# Patient Record
Sex: Female | Born: 1955
Health system: Southern US, Community
[De-identification: ages and names within clinical notes are randomized; demographics above are authoritative.]

## PROBLEM LIST (undated history)

## (undated) DIAGNOSIS — H269 Unspecified cataract: Secondary | ICD-10-CM

## (undated) DIAGNOSIS — Z9889 Other specified postprocedural states: Secondary | ICD-10-CM

## (undated) DIAGNOSIS — S52502A Unspecified fracture of the lower end of left radius, initial encounter for closed fracture: Secondary | ICD-10-CM

## (undated) DIAGNOSIS — Z9049 Acquired absence of other specified parts of digestive tract: Secondary | ICD-10-CM

## (undated) DIAGNOSIS — I499 Cardiac arrhythmia, unspecified: Secondary | ICD-10-CM

## (undated) DIAGNOSIS — T8859XA Other complications of anesthesia, initial encounter: Secondary | ICD-10-CM

## (undated) DIAGNOSIS — Z98891 History of uterine scar from previous surgery: Secondary | ICD-10-CM

## (undated) DIAGNOSIS — E785 Hyperlipidemia, unspecified: Secondary | ICD-10-CM

## (undated) DIAGNOSIS — K3184 Gastroparesis: Secondary | ICD-10-CM

## (undated) DIAGNOSIS — K219 Gastro-esophageal reflux disease without esophagitis: Secondary | ICD-10-CM

## (undated) DIAGNOSIS — R112 Nausea with vomiting, unspecified: Secondary | ICD-10-CM

## (undated) DIAGNOSIS — J45909 Unspecified asthma, uncomplicated: Secondary | ICD-10-CM

## (undated) DIAGNOSIS — M6740 Ganglion, unspecified site: Secondary | ICD-10-CM

## (undated) DIAGNOSIS — I10 Essential (primary) hypertension: Secondary | ICD-10-CM

## (undated) DIAGNOSIS — M1990 Unspecified osteoarthritis, unspecified site: Secondary | ICD-10-CM

## (undated) DIAGNOSIS — E1140 Type 2 diabetes mellitus with diabetic neuropathy, unspecified: Secondary | ICD-10-CM

## (undated) DIAGNOSIS — H9190 Unspecified hearing loss, unspecified ear: Secondary | ICD-10-CM

## (undated) DIAGNOSIS — D649 Anemia, unspecified: Secondary | ICD-10-CM

## (undated) DIAGNOSIS — G709 Myoneural disorder, unspecified: Secondary | ICD-10-CM

## (undated) DIAGNOSIS — I471 Supraventricular tachycardia: Secondary | ICD-10-CM

## (undated) HISTORY — DX: Gastro-esophageal reflux disease without esophagitis: K21.9

## (undated) HISTORY — PX: TUBAL LIGATION: SHX77

## (undated) HISTORY — DX: Ganglion, unspecified site: M67.40

## (undated) HISTORY — DX: Hyperlipidemia, unspecified: E78.5

## (undated) HISTORY — DX: Essential (primary) hypertension: I10

## (undated) HISTORY — DX: Type 2 diabetes mellitus with diabetic neuropathy, unspecified: E11.40

## (undated) HISTORY — DX: Gastroparesis: K31.84

## (undated) HISTORY — PX: WRIST SURGERY: SHX841

## (undated) HISTORY — DX: Supraventricular tachycardia: I47.1

## (undated) HISTORY — DX: Unspecified hearing loss, unspecified ear: H91.90

## (undated) HISTORY — PX: APPENDECTOMY: SHX54

---
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
## 1983-07-04 HISTORY — PX: WRIST SURGERY: SHX841

## 1997-11-24 ENCOUNTER — Encounter: Admission: RE | Admit: 1997-11-24 | Discharge: 1997-11-24 | Payer: Self-pay | Admitting: Family Medicine

## 1997-12-22 ENCOUNTER — Encounter: Admission: RE | Admit: 1997-12-22 | Discharge: 1997-12-22 | Payer: Self-pay | Admitting: Family Medicine

## 1998-02-05 ENCOUNTER — Encounter: Admission: RE | Admit: 1998-02-05 | Discharge: 1998-02-05 | Payer: Self-pay | Admitting: Family Medicine

## 1998-02-16 ENCOUNTER — Encounter: Admission: RE | Admit: 1998-02-16 | Discharge: 1998-02-16 | Payer: Self-pay | Admitting: Sports Medicine

## 1998-03-02 ENCOUNTER — Encounter: Admission: RE | Admit: 1998-03-02 | Discharge: 1998-03-02 | Payer: Self-pay | Admitting: Sports Medicine

## 1998-03-04 ENCOUNTER — Encounter: Admission: RE | Admit: 1998-03-04 | Discharge: 1998-03-04 | Payer: Self-pay | Admitting: Family Medicine

## 1998-03-10 ENCOUNTER — Encounter: Admission: RE | Admit: 1998-03-10 | Discharge: 1998-03-10 | Payer: Self-pay | Admitting: Family Medicine

## 1998-10-25 ENCOUNTER — Encounter: Admission: RE | Admit: 1998-10-25 | Discharge: 1998-10-25 | Payer: Self-pay | Admitting: Family Medicine

## 1998-11-21 ENCOUNTER — Emergency Department (HOSPITAL_COMMUNITY): Admission: EM | Admit: 1998-11-21 | Discharge: 1998-11-21 | Payer: Self-pay | Admitting: Emergency Medicine

## 1998-11-21 ENCOUNTER — Encounter: Payer: Self-pay | Admitting: Emergency Medicine

## 1999-01-06 ENCOUNTER — Encounter: Admission: RE | Admit: 1999-01-06 | Discharge: 1999-01-06 | Payer: Self-pay | Admitting: Family Medicine

## 1999-05-03 ENCOUNTER — Encounter: Admission: RE | Admit: 1999-05-03 | Discharge: 1999-05-03 | Payer: Self-pay | Admitting: Family Medicine

## 1999-05-18 ENCOUNTER — Encounter: Admission: RE | Admit: 1999-05-18 | Discharge: 1999-05-18 | Payer: Self-pay | Admitting: *Deleted

## 1999-11-22 ENCOUNTER — Encounter: Admission: RE | Admit: 1999-11-22 | Discharge: 1999-11-22 | Payer: Self-pay | Admitting: Family Medicine

## 1999-11-29 ENCOUNTER — Encounter: Admission: RE | Admit: 1999-11-29 | Discharge: 1999-11-29 | Payer: Self-pay | Admitting: Sports Medicine

## 1999-12-26 ENCOUNTER — Encounter: Admission: RE | Admit: 1999-12-26 | Discharge: 1999-12-26 | Payer: Self-pay | Admitting: Family Medicine

## 2000-01-17 ENCOUNTER — Encounter: Admission: RE | Admit: 2000-01-17 | Discharge: 2000-01-17 | Payer: Self-pay | Admitting: Sports Medicine

## 2000-01-26 ENCOUNTER — Encounter: Payer: Self-pay | Admitting: Family Medicine

## 2000-01-26 ENCOUNTER — Encounter: Admission: RE | Admit: 2000-01-26 | Discharge: 2000-01-26 | Payer: Self-pay | Admitting: Family Medicine

## 2000-01-26 ENCOUNTER — Encounter: Admission: RE | Admit: 2000-01-26 | Discharge: 2000-01-26 | Payer: Self-pay | Admitting: *Deleted

## 2000-01-30 ENCOUNTER — Encounter: Admission: RE | Admit: 2000-01-30 | Discharge: 2000-01-30 | Payer: Self-pay | Admitting: Family Medicine

## 2000-05-15 ENCOUNTER — Encounter: Admission: RE | Admit: 2000-05-15 | Discharge: 2000-05-15 | Payer: Self-pay | Admitting: Sports Medicine

## 2000-05-18 ENCOUNTER — Encounter: Admission: RE | Admit: 2000-05-18 | Discharge: 2000-05-18 | Payer: Self-pay | Admitting: Family Medicine

## 2000-06-12 ENCOUNTER — Encounter: Admission: RE | Admit: 2000-06-12 | Discharge: 2000-06-12 | Payer: Self-pay | Admitting: Family Medicine

## 2000-07-13 ENCOUNTER — Encounter: Admission: RE | Admit: 2000-07-13 | Discharge: 2000-07-13 | Payer: Self-pay | Admitting: Family Medicine

## 2000-07-17 ENCOUNTER — Encounter: Admission: RE | Admit: 2000-07-17 | Discharge: 2000-07-17 | Payer: Self-pay | Admitting: Sports Medicine

## 2000-08-07 ENCOUNTER — Encounter: Admission: RE | Admit: 2000-08-07 | Discharge: 2000-08-07 | Payer: Self-pay | Admitting: Sports Medicine

## 2000-09-05 ENCOUNTER — Encounter: Payer: Self-pay | Admitting: Family Medicine

## 2000-09-05 ENCOUNTER — Encounter: Admission: RE | Admit: 2000-09-05 | Discharge: 2000-09-05 | Payer: Self-pay | Admitting: Family Medicine

## 2000-09-19 ENCOUNTER — Encounter: Payer: Self-pay | Admitting: Sports Medicine

## 2000-09-19 ENCOUNTER — Encounter: Admission: RE | Admit: 2000-09-19 | Discharge: 2000-09-19 | Payer: Self-pay | Admitting: Family Medicine

## 2000-09-19 ENCOUNTER — Encounter: Admission: RE | Admit: 2000-09-19 | Discharge: 2000-09-19 | Payer: Self-pay | Admitting: Sports Medicine

## 2000-09-26 ENCOUNTER — Encounter: Admission: RE | Admit: 2000-09-26 | Discharge: 2000-09-26 | Payer: Self-pay | Admitting: Family Medicine

## 2000-10-10 ENCOUNTER — Encounter: Admission: RE | Admit: 2000-10-10 | Discharge: 2000-10-10 | Payer: Self-pay | Admitting: Family Medicine

## 2000-11-21 ENCOUNTER — Encounter: Admission: RE | Admit: 2000-11-21 | Discharge: 2000-11-21 | Payer: Self-pay | Admitting: Family Medicine

## 2000-11-29 ENCOUNTER — Encounter: Admission: RE | Admit: 2000-11-29 | Discharge: 2000-11-29 | Payer: Self-pay | Admitting: Family Medicine

## 2000-11-29 ENCOUNTER — Encounter: Payer: Self-pay | Admitting: Family Medicine

## 2000-12-20 ENCOUNTER — Encounter: Admission: RE | Admit: 2000-12-20 | Discharge: 2000-12-20 | Payer: Self-pay | Admitting: Family Medicine

## 2000-12-27 ENCOUNTER — Encounter: Admission: RE | Admit: 2000-12-27 | Discharge: 2000-12-27 | Payer: Self-pay | Admitting: Family Medicine

## 2001-01-08 ENCOUNTER — Emergency Department (HOSPITAL_COMMUNITY): Admission: EM | Admit: 2001-01-08 | Discharge: 2001-01-08 | Payer: Self-pay

## 2001-01-24 ENCOUNTER — Encounter: Admission: RE | Admit: 2001-01-24 | Discharge: 2001-01-24 | Payer: Self-pay | Admitting: Family Medicine

## 2001-03-11 ENCOUNTER — Encounter: Admission: RE | Admit: 2001-03-11 | Discharge: 2001-03-11 | Payer: Self-pay | Admitting: Family Medicine

## 2001-05-28 ENCOUNTER — Encounter: Admission: RE | Admit: 2001-05-28 | Discharge: 2001-05-28 | Payer: Self-pay | Admitting: Family Medicine

## 2001-05-28 ENCOUNTER — Encounter: Admission: RE | Admit: 2001-05-28 | Discharge: 2001-05-28 | Payer: Self-pay | Admitting: *Deleted

## 2001-06-24 ENCOUNTER — Encounter: Admission: RE | Admit: 2001-06-24 | Discharge: 2001-06-24 | Payer: Self-pay | Admitting: Family Medicine

## 2001-08-13 ENCOUNTER — Encounter: Admission: RE | Admit: 2001-08-13 | Discharge: 2001-08-13 | Payer: Self-pay | Admitting: Family Medicine

## 2001-08-28 ENCOUNTER — Encounter: Admission: RE | Admit: 2001-08-28 | Discharge: 2001-08-28 | Payer: Self-pay | Admitting: Family Medicine

## 2001-09-11 ENCOUNTER — Encounter: Payer: Self-pay | Admitting: Family Medicine

## 2001-09-11 ENCOUNTER — Encounter: Admission: RE | Admit: 2001-09-11 | Discharge: 2001-09-11 | Payer: Self-pay | Admitting: Family Medicine

## 2001-09-20 ENCOUNTER — Encounter: Admission: RE | Admit: 2001-09-20 | Discharge: 2001-09-20 | Payer: Self-pay | Admitting: Family Medicine

## 2001-09-20 ENCOUNTER — Encounter: Payer: Self-pay | Admitting: Family Medicine

## 2001-09-26 ENCOUNTER — Encounter: Admission: RE | Admit: 2001-09-26 | Discharge: 2001-09-26 | Payer: Self-pay | Admitting: Family Medicine

## 2001-10-11 ENCOUNTER — Encounter: Admission: RE | Admit: 2001-10-11 | Discharge: 2001-10-11 | Payer: Self-pay | Admitting: Family Medicine

## 2001-10-30 ENCOUNTER — Encounter: Admission: RE | Admit: 2001-10-30 | Discharge: 2001-10-30 | Payer: Self-pay | Admitting: Family Medicine

## 2001-11-08 ENCOUNTER — Encounter: Admission: RE | Admit: 2001-11-08 | Discharge: 2001-11-08 | Payer: Self-pay | Admitting: Family Medicine

## 2001-11-14 ENCOUNTER — Encounter: Admission: RE | Admit: 2001-11-14 | Discharge: 2001-12-09 | Payer: Self-pay | Admitting: Sports Medicine

## 2001-12-19 ENCOUNTER — Encounter: Admission: RE | Admit: 2001-12-19 | Discharge: 2001-12-19 | Payer: Self-pay | Admitting: Family Medicine

## 2001-12-25 ENCOUNTER — Encounter: Admission: RE | Admit: 2001-12-25 | Discharge: 2001-12-25 | Payer: Self-pay | Admitting: Family Medicine

## 2002-01-27 ENCOUNTER — Encounter: Admission: RE | Admit: 2002-01-27 | Discharge: 2002-04-15 | Payer: Self-pay | Admitting: Family Medicine

## 2002-01-29 ENCOUNTER — Encounter: Admission: RE | Admit: 2002-01-29 | Discharge: 2002-01-29 | Payer: Self-pay | Admitting: Family Medicine

## 2002-02-04 ENCOUNTER — Encounter: Admission: RE | Admit: 2002-02-04 | Discharge: 2002-02-04 | Payer: Self-pay | Admitting: Family Medicine

## 2002-02-19 ENCOUNTER — Encounter: Admission: RE | Admit: 2002-02-19 | Discharge: 2002-02-19 | Payer: Self-pay | Admitting: Family Medicine

## 2002-03-27 ENCOUNTER — Encounter: Admission: RE | Admit: 2002-03-27 | Discharge: 2002-03-27 | Payer: Self-pay | Admitting: Family Medicine

## 2002-06-20 ENCOUNTER — Encounter: Admission: RE | Admit: 2002-06-20 | Discharge: 2002-06-20 | Payer: Self-pay | Admitting: Family Medicine

## 2002-07-14 ENCOUNTER — Encounter: Admission: RE | Admit: 2002-07-14 | Discharge: 2002-07-14 | Payer: Self-pay | Admitting: Family Medicine

## 2002-08-28 ENCOUNTER — Encounter: Admission: RE | Admit: 2002-08-28 | Discharge: 2002-08-28 | Payer: Self-pay | Admitting: Family Medicine

## 2002-09-01 ENCOUNTER — Encounter: Payer: Self-pay | Admitting: Sports Medicine

## 2002-09-01 ENCOUNTER — Encounter: Admission: RE | Admit: 2002-09-01 | Discharge: 2002-09-01 | Payer: Self-pay | Admitting: Sports Medicine

## 2002-09-04 ENCOUNTER — Encounter: Admission: RE | Admit: 2002-09-04 | Discharge: 2002-09-04 | Payer: Self-pay | Admitting: Family Medicine

## 2002-09-15 ENCOUNTER — Ambulatory Visit (HOSPITAL_COMMUNITY): Admission: RE | Admit: 2002-09-15 | Discharge: 2002-09-15 | Payer: Self-pay | Admitting: *Deleted

## 2002-09-26 ENCOUNTER — Encounter: Admission: RE | Admit: 2002-09-26 | Discharge: 2002-09-26 | Payer: Self-pay | Admitting: Family Medicine

## 2002-10-14 ENCOUNTER — Ambulatory Visit (HOSPITAL_COMMUNITY): Admission: RE | Admit: 2002-10-14 | Discharge: 2002-10-14 | Payer: Self-pay | Admitting: *Deleted

## 2002-10-14 ENCOUNTER — Encounter (INDEPENDENT_AMBULATORY_CARE_PROVIDER_SITE_OTHER): Payer: Self-pay | Admitting: Cardiology

## 2002-11-20 ENCOUNTER — Encounter: Admission: RE | Admit: 2002-11-20 | Discharge: 2002-11-20 | Payer: Self-pay | Admitting: Sports Medicine

## 2002-12-09 ENCOUNTER — Encounter: Admission: RE | Admit: 2002-12-09 | Discharge: 2002-12-09 | Payer: Self-pay | Admitting: Family Medicine

## 2002-12-25 ENCOUNTER — Encounter: Admission: RE | Admit: 2002-12-25 | Discharge: 2002-12-25 | Payer: Self-pay | Admitting: Family Medicine

## 2003-02-09 ENCOUNTER — Encounter: Admission: RE | Admit: 2003-02-09 | Discharge: 2003-02-09 | Payer: Self-pay | Admitting: Family Medicine

## 2003-02-16 ENCOUNTER — Encounter: Admission: RE | Admit: 2003-02-16 | Discharge: 2003-02-16 | Payer: Self-pay | Admitting: Sports Medicine

## 2003-04-29 ENCOUNTER — Encounter: Admission: RE | Admit: 2003-04-29 | Discharge: 2003-04-29 | Payer: Self-pay | Admitting: Family Medicine

## 2003-08-18 ENCOUNTER — Encounter: Admission: RE | Admit: 2003-08-18 | Discharge: 2003-08-18 | Payer: Self-pay | Admitting: Sports Medicine

## 2003-09-01 ENCOUNTER — Encounter: Admission: RE | Admit: 2003-09-01 | Discharge: 2003-09-01 | Payer: Self-pay | Admitting: Family Medicine

## 2003-09-03 ENCOUNTER — Encounter: Admission: RE | Admit: 2003-09-03 | Discharge: 2003-09-03 | Payer: Self-pay | Admitting: Family Medicine

## 2003-09-21 ENCOUNTER — Ambulatory Visit (HOSPITAL_COMMUNITY): Admission: RE | Admit: 2003-09-21 | Discharge: 2003-09-21 | Payer: Self-pay | Admitting: Family Medicine

## 2003-10-16 ENCOUNTER — Encounter: Admission: RE | Admit: 2003-10-16 | Discharge: 2003-10-16 | Payer: Self-pay | Admitting: Sports Medicine

## 2003-12-14 ENCOUNTER — Encounter: Admission: RE | Admit: 2003-12-14 | Discharge: 2003-12-14 | Payer: Self-pay | Admitting: Family Medicine

## 2003-12-14 ENCOUNTER — Ambulatory Visit (HOSPITAL_COMMUNITY): Admission: RE | Admit: 2003-12-14 | Discharge: 2003-12-14 | Payer: Self-pay | Admitting: Family Medicine

## 2003-12-28 ENCOUNTER — Encounter: Admission: RE | Admit: 2003-12-28 | Discharge: 2003-12-28 | Payer: Self-pay | Admitting: Family Medicine

## 2004-02-17 ENCOUNTER — Encounter: Admission: RE | Admit: 2004-02-17 | Discharge: 2004-02-17 | Payer: Self-pay | Admitting: Family Medicine

## 2004-04-11 ENCOUNTER — Ambulatory Visit: Payer: Self-pay | Admitting: Sports Medicine

## 2004-06-30 ENCOUNTER — Ambulatory Visit: Payer: Self-pay | Admitting: Family Medicine

## 2004-07-03 HISTORY — PX: OTHER SURGICAL HISTORY: SHX169

## 2004-09-09 ENCOUNTER — Ambulatory Visit: Payer: Self-pay | Admitting: Family Medicine

## 2004-09-13 ENCOUNTER — Ambulatory Visit: Payer: Self-pay | Admitting: Family Medicine

## 2004-09-21 ENCOUNTER — Ambulatory Visit (HOSPITAL_COMMUNITY): Admission: RE | Admit: 2004-09-21 | Discharge: 2004-09-21 | Payer: Self-pay | Admitting: Family Medicine

## 2004-10-11 ENCOUNTER — Ambulatory Visit: Payer: Self-pay | Admitting: Family Medicine

## 2004-11-01 ENCOUNTER — Ambulatory Visit (HOSPITAL_COMMUNITY): Admission: RE | Admit: 2004-11-01 | Discharge: 2004-11-01 | Payer: Self-pay | Admitting: Family Medicine

## 2004-12-22 ENCOUNTER — Ambulatory Visit: Payer: Self-pay | Admitting: Family Medicine

## 2004-12-30 ENCOUNTER — Ambulatory Visit: Payer: Self-pay | Admitting: Sports Medicine

## 2005-01-13 ENCOUNTER — Ambulatory Visit (HOSPITAL_COMMUNITY): Admission: RE | Admit: 2005-01-13 | Discharge: 2005-01-13 | Payer: Self-pay | Admitting: Family Medicine

## 2005-01-13 ENCOUNTER — Ambulatory Visit: Payer: Self-pay | Admitting: Family Medicine

## 2005-01-18 ENCOUNTER — Ambulatory Visit: Payer: Self-pay | Admitting: Family Medicine

## 2005-01-27 ENCOUNTER — Ambulatory Visit: Payer: Self-pay | Admitting: Family Medicine

## 2005-02-27 ENCOUNTER — Ambulatory Visit (HOSPITAL_COMMUNITY): Admission: RE | Admit: 2005-02-27 | Discharge: 2005-02-27 | Payer: Self-pay | Admitting: Family Medicine

## 2005-02-27 ENCOUNTER — Ambulatory Visit: Payer: Self-pay | Admitting: Family Medicine

## 2005-03-15 ENCOUNTER — Ambulatory Visit: Payer: Self-pay | Admitting: Family Medicine

## 2005-03-28 ENCOUNTER — Encounter: Admission: RE | Admit: 2005-03-28 | Discharge: 2005-06-26 | Payer: Self-pay | Admitting: Internal Medicine

## 2005-04-11 ENCOUNTER — Ambulatory Visit: Payer: Self-pay | Admitting: Sports Medicine

## 2005-06-01 ENCOUNTER — Encounter: Admission: RE | Admit: 2005-06-01 | Discharge: 2005-06-01 | Payer: Self-pay | Admitting: Obstetrics and Gynecology

## 2005-06-04 ENCOUNTER — Encounter: Admission: RE | Admit: 2005-06-04 | Discharge: 2005-06-04 | Payer: Self-pay | Admitting: Interventional Radiology

## 2005-06-30 ENCOUNTER — Ambulatory Visit (HOSPITAL_COMMUNITY): Admission: EM | Admit: 2005-06-30 | Discharge: 2005-07-01 | Payer: Self-pay | Admitting: Interventional Radiology

## 2005-07-03 HISTORY — PX: UTERINE FIBROID EMBOLIZATION: SHX825

## 2005-07-14 ENCOUNTER — Ambulatory Visit: Payer: Self-pay | Admitting: Sports Medicine

## 2005-07-31 ENCOUNTER — Ambulatory Visit: Payer: Self-pay | Admitting: Sports Medicine

## 2005-08-29 ENCOUNTER — Ambulatory Visit: Payer: Self-pay | Admitting: Family Medicine

## 2005-09-22 ENCOUNTER — Ambulatory Visit (HOSPITAL_COMMUNITY): Admission: RE | Admit: 2005-09-22 | Discharge: 2005-09-22 | Payer: Self-pay | Admitting: Family Medicine

## 2005-10-01 ENCOUNTER — Encounter (INDEPENDENT_AMBULATORY_CARE_PROVIDER_SITE_OTHER): Payer: Self-pay | Admitting: *Deleted

## 2005-10-01 LAB — CONVERTED CEMR LAB

## 2005-10-12 ENCOUNTER — Encounter: Admission: RE | Admit: 2005-10-12 | Discharge: 2005-10-12 | Payer: Self-pay | Admitting: Interventional Radiology

## 2005-10-23 ENCOUNTER — Ambulatory Visit: Payer: Self-pay | Admitting: Family Medicine

## 2005-11-06 ENCOUNTER — Ambulatory Visit: Payer: Self-pay | Admitting: Family Medicine

## 2006-03-02 ENCOUNTER — Ambulatory Visit: Payer: Self-pay | Admitting: Family Medicine

## 2006-03-22 ENCOUNTER — Encounter: Admission: RE | Admit: 2006-03-22 | Discharge: 2006-03-22 | Payer: Self-pay | Admitting: Interventional Radiology

## 2006-03-24 ENCOUNTER — Emergency Department (HOSPITAL_COMMUNITY): Admission: EM | Admit: 2006-03-24 | Discharge: 2006-03-24 | Payer: Self-pay | Admitting: Emergency Medicine

## 2006-04-02 ENCOUNTER — Ambulatory Visit: Payer: Self-pay | Admitting: Family Medicine

## 2006-05-30 ENCOUNTER — Ambulatory Visit: Payer: Self-pay | Admitting: Family Medicine

## 2006-06-18 ENCOUNTER — Ambulatory Visit: Payer: Self-pay | Admitting: Family Medicine

## 2006-07-13 ENCOUNTER — Ambulatory Visit: Payer: Self-pay | Admitting: Family Medicine

## 2006-08-30 DIAGNOSIS — E1139 Type 2 diabetes mellitus with other diabetic ophthalmic complication: Secondary | ICD-10-CM | POA: Insufficient documentation

## 2006-08-30 DIAGNOSIS — E1142 Type 2 diabetes mellitus with diabetic polyneuropathy: Secondary | ICD-10-CM | POA: Insufficient documentation

## 2006-08-30 DIAGNOSIS — H9190 Unspecified hearing loss, unspecified ear: Secondary | ICD-10-CM | POA: Insufficient documentation

## 2006-08-30 DIAGNOSIS — E7800 Pure hypercholesterolemia, unspecified: Secondary | ICD-10-CM | POA: Insufficient documentation

## 2006-08-30 DIAGNOSIS — E119 Type 2 diabetes mellitus without complications: Secondary | ICD-10-CM

## 2006-08-30 DIAGNOSIS — J309 Allergic rhinitis, unspecified: Secondary | ICD-10-CM | POA: Insufficient documentation

## 2006-08-30 DIAGNOSIS — I10 Essential (primary) hypertension: Secondary | ICD-10-CM | POA: Insufficient documentation

## 2006-08-31 ENCOUNTER — Encounter (INDEPENDENT_AMBULATORY_CARE_PROVIDER_SITE_OTHER): Payer: Self-pay | Admitting: *Deleted

## 2006-09-25 ENCOUNTER — Ambulatory Visit (HOSPITAL_COMMUNITY): Admission: RE | Admit: 2006-09-25 | Discharge: 2006-09-25 | Payer: Self-pay | Admitting: Family Medicine

## 2006-09-25 ENCOUNTER — Encounter (INDEPENDENT_AMBULATORY_CARE_PROVIDER_SITE_OTHER): Payer: Self-pay | Admitting: Family Medicine

## 2006-10-08 ENCOUNTER — Encounter (INDEPENDENT_AMBULATORY_CARE_PROVIDER_SITE_OTHER): Payer: Self-pay | Admitting: Family Medicine

## 2006-10-08 ENCOUNTER — Encounter: Admission: RE | Admit: 2006-10-08 | Discharge: 2006-10-08 | Payer: Self-pay | Admitting: Sports Medicine

## 2006-10-15 ENCOUNTER — Encounter (INDEPENDENT_AMBULATORY_CARE_PROVIDER_SITE_OTHER): Payer: Self-pay | Admitting: Family Medicine

## 2006-10-15 ENCOUNTER — Ambulatory Visit: Payer: Self-pay

## 2006-10-15 LAB — CONVERTED CEMR LAB
ALT: 9 units/L (ref 0–35)
AST: 12 units/L (ref 0–37)
Albumin: 4.3 g/dL (ref 3.5–5.2)
Alkaline Phosphatase: 89 units/L (ref 39–117)
BUN: 18 mg/dL (ref 6–23)
CO2: 26 meq/L (ref 19–32)
Calcium: 10.2 mg/dL (ref 8.4–10.5)
Chloride: 100 meq/L (ref 96–112)
Creatinine, Ser: 0.74 mg/dL (ref 0.40–1.20)
Glucose, Bld: 90 mg/dL (ref 70–99)
Hgb A1c MFr Bld: 7.3 %
Potassium: 4.1 meq/L (ref 3.5–5.3)
Sodium: 143 meq/L (ref 135–145)
Total Bilirubin: 0.3 mg/dL (ref 0.3–1.2)
Total Protein: 7.7 g/dL (ref 6.0–8.3)

## 2006-12-07 ENCOUNTER — Encounter (INDEPENDENT_AMBULATORY_CARE_PROVIDER_SITE_OTHER): Payer: Self-pay | Admitting: Family Medicine

## 2006-12-07 ENCOUNTER — Ambulatory Visit: Payer: Self-pay | Admitting: Sports Medicine

## 2006-12-07 LAB — CONVERTED CEMR LAB
Hgb A1c MFr Bld: 7.5 %
Whiff Test: NEGATIVE

## 2007-03-11 ENCOUNTER — Ambulatory Visit: Payer: Self-pay | Admitting: Sports Medicine

## 2007-03-11 ENCOUNTER — Encounter: Payer: Self-pay | Admitting: Family Medicine

## 2007-03-11 LAB — CONVERTED CEMR LAB
ALT: 12 units/L (ref 0–35)
AST: 13 units/L (ref 0–37)
Albumin: 4.2 g/dL (ref 3.5–5.2)
Alkaline Phosphatase: 85 units/L (ref 39–117)
BUN: 16 mg/dL (ref 6–23)
CO2: 26 meq/L (ref 19–32)
Calcium: 10.2 mg/dL (ref 8.4–10.5)
Chloride: 103 meq/L (ref 96–112)
Cholesterol: 184 mg/dL (ref 0–200)
Creatinine, Ser: 0.51 mg/dL (ref 0.40–1.20)
Glucose, Bld: 79 mg/dL (ref 70–99)
HDL: 72 mg/dL (ref >39)
Hgb A1c MFr Bld: 7.2 %
LDL Cholesterol: 102 mg/dL — ABNORMAL HIGH (ref 0–99)
Potassium: 3.6 meq/L (ref 3.5–5.3)
Sodium: 141 meq/L (ref 135–145)
Total Bilirubin: 0.3 mg/dL (ref 0.3–1.2)
Total CHOL/HDL Ratio: 2.6
Total Protein: 7.6 g/dL (ref 6.0–8.3)
Triglycerides: 51 mg/dL (ref <150)
VLDL: 10 mg/dL (ref 0–40)

## 2007-03-15 ENCOUNTER — Ambulatory Visit: Payer: Self-pay | Admitting: Family Medicine

## 2007-04-19 ENCOUNTER — Telehealth: Payer: Self-pay | Admitting: Family Medicine

## 2007-06-03 ENCOUNTER — Encounter: Payer: Self-pay | Admitting: *Deleted

## 2007-06-04 ENCOUNTER — Ambulatory Visit: Payer: Self-pay

## 2007-06-14 ENCOUNTER — Ambulatory Visit: Payer: Self-pay | Admitting: Family Medicine

## 2007-06-14 LAB — CONVERTED CEMR LAB: Hgb A1c MFr Bld: 7.6 %

## 2007-07-29 ENCOUNTER — Encounter: Payer: Self-pay | Admitting: Family Medicine

## 2007-09-17 ENCOUNTER — Ambulatory Visit: Payer: Self-pay | Admitting: Family Medicine

## 2007-09-17 DIAGNOSIS — M1710 Unilateral primary osteoarthritis, unspecified knee: Secondary | ICD-10-CM

## 2007-09-17 DIAGNOSIS — IMO0002 Reserved for concepts with insufficient information to code with codable children: Secondary | ICD-10-CM

## 2007-09-17 HISTORY — DX: Unilateral primary osteoarthritis, unspecified knee: M17.10

## 2007-09-17 LAB — CONVERTED CEMR LAB: Hgb A1c MFr Bld: 7.3 %

## 2007-09-23 ENCOUNTER — Encounter (INDEPENDENT_AMBULATORY_CARE_PROVIDER_SITE_OTHER): Payer: Self-pay | Admitting: *Deleted

## 2007-10-09 ENCOUNTER — Ambulatory Visit (HOSPITAL_COMMUNITY): Admission: RE | Admit: 2007-10-09 | Discharge: 2007-10-09 | Payer: Self-pay | Admitting: Family Medicine

## 2007-11-06 ENCOUNTER — Ambulatory Visit: Payer: Self-pay

## 2007-12-11 ENCOUNTER — Encounter: Payer: Self-pay | Admitting: Family Medicine

## 2007-12-11 ENCOUNTER — Other Ambulatory Visit: Admission: RE | Admit: 2007-12-11 | Discharge: 2007-12-11 | Payer: Self-pay | Admitting: Family Medicine

## 2007-12-11 ENCOUNTER — Ambulatory Visit: Payer: Self-pay | Admitting: Family Medicine

## 2007-12-11 LAB — CONVERTED CEMR LAB
ALT: 10 units/L (ref 0–35)
AST: 11 units/L (ref 0–37)
Albumin: 4.2 g/dL (ref 3.5–5.2)
Alkaline Phosphatase: 94 units/L (ref 39–117)
BUN: 10 mg/dL (ref 6–23)
CO2: 23 meq/L (ref 19–32)
Calcium: 10.4 mg/dL (ref 8.4–10.5)
Chloride: 100 meq/L (ref 96–112)
Creatinine, Ser: 0.57 mg/dL (ref 0.40–1.20)
Direct LDL: 96 mg/dL
Glucose, Bld: 190 mg/dL — ABNORMAL HIGH (ref 70–99)
Hgb A1c MFr Bld: 7.7 %
Potassium: 4 meq/L (ref 3.5–5.3)
Rhuematoid fact SerPl-aCnc: <20 intl units/mL (ref 0–20)
Sed Rate: 20 mm/hr (ref 0–22)
Sodium: 138 meq/L (ref 135–145)
Total Bilirubin: 0.2 mg/dL — ABNORMAL LOW (ref 0.3–1.2)
Total CK: 71 units/L (ref 7–177)
Total Protein: 7.5 g/dL (ref 6.0–8.3)

## 2007-12-12 ENCOUNTER — Telehealth (INDEPENDENT_AMBULATORY_CARE_PROVIDER_SITE_OTHER): Payer: Self-pay | Admitting: *Deleted

## 2007-12-18 ENCOUNTER — Encounter: Admission: RE | Admit: 2007-12-18 | Discharge: 2007-12-18 | Payer: Self-pay | Admitting: Family Medicine

## 2007-12-25 ENCOUNTER — Telehealth (INDEPENDENT_AMBULATORY_CARE_PROVIDER_SITE_OTHER): Payer: Self-pay | Admitting: Family Medicine

## 2007-12-25 ENCOUNTER — Telehealth (INDEPENDENT_AMBULATORY_CARE_PROVIDER_SITE_OTHER): Payer: Self-pay | Admitting: *Deleted

## 2007-12-30 ENCOUNTER — Ambulatory Visit: Payer: Self-pay | Admitting: Family Medicine

## 2007-12-30 DIAGNOSIS — K219 Gastro-esophageal reflux disease without esophagitis: Secondary | ICD-10-CM | POA: Insufficient documentation

## 2008-01-09 ENCOUNTER — Encounter: Admission: RE | Admit: 2008-01-09 | Discharge: 2008-02-06 | Payer: Self-pay | Admitting: Family Medicine

## 2008-01-28 ENCOUNTER — Ambulatory Visit: Payer: Self-pay | Admitting: Family Medicine

## 2008-01-31 ENCOUNTER — Encounter: Payer: Self-pay | Admitting: Family Medicine

## 2008-02-07 ENCOUNTER — Ambulatory Visit: Payer: Self-pay | Admitting: Family Medicine

## 2008-03-11 ENCOUNTER — Encounter: Payer: Self-pay | Admitting: Family Medicine

## 2008-03-30 ENCOUNTER — Ambulatory Visit: Payer: Self-pay | Admitting: Family Medicine

## 2008-03-30 LAB — CONVERTED CEMR LAB: Hgb A1c MFr Bld: 7.9 %

## 2008-04-03 ENCOUNTER — Encounter: Payer: Self-pay | Admitting: Family Medicine

## 2008-05-07 ENCOUNTER — Telehealth (INDEPENDENT_AMBULATORY_CARE_PROVIDER_SITE_OTHER): Payer: Self-pay | Admitting: *Deleted

## 2008-05-12 ENCOUNTER — Encounter: Admission: RE | Admit: 2008-05-12 | Discharge: 2008-05-12 | Payer: Self-pay | Admitting: Family Medicine

## 2008-05-12 ENCOUNTER — Ambulatory Visit: Payer: Self-pay | Admitting: Family Medicine

## 2008-05-12 ENCOUNTER — Telehealth (INDEPENDENT_AMBULATORY_CARE_PROVIDER_SITE_OTHER): Payer: Self-pay | Admitting: *Deleted

## 2008-07-23 ENCOUNTER — Encounter: Payer: Self-pay | Admitting: Family Medicine

## 2008-07-24 ENCOUNTER — Ambulatory Visit: Payer: Self-pay | Admitting: Family Medicine

## 2008-07-24 ENCOUNTER — Ambulatory Visit (HOSPITAL_COMMUNITY): Admission: RE | Admit: 2008-07-24 | Discharge: 2008-07-24 | Payer: Self-pay | Admitting: Family Medicine

## 2008-07-24 ENCOUNTER — Encounter: Payer: Self-pay | Admitting: Family Medicine

## 2008-07-24 DIAGNOSIS — E669 Obesity, unspecified: Secondary | ICD-10-CM | POA: Insufficient documentation

## 2008-07-24 LAB — CONVERTED CEMR LAB: Hgb A1c MFr Bld: 7.7 %

## 2008-07-31 ENCOUNTER — Encounter: Payer: Self-pay | Admitting: Family Medicine

## 2008-08-20 ENCOUNTER — Ambulatory Visit: Payer: Self-pay | Admitting: Family Medicine

## 2008-08-20 ENCOUNTER — Encounter: Payer: Self-pay | Admitting: Family Medicine

## 2008-08-20 LAB — CONVERTED CEMR LAB
Cholesterol: 157 mg/dL (ref 0–200)
HDL: 69 mg/dL (ref >39)
LDL Cholesterol: 77 mg/dL (ref 0–99)
Total CHOL/HDL Ratio: 2.3
Triglycerides: 57 mg/dL (ref <150)
VLDL: 11 mg/dL (ref 0–40)

## 2008-08-21 ENCOUNTER — Encounter: Payer: Self-pay | Admitting: Family Medicine

## 2008-10-09 ENCOUNTER — Ambulatory Visit (HOSPITAL_COMMUNITY): Admission: RE | Admit: 2008-10-09 | Discharge: 2008-10-09 | Payer: Self-pay | Admitting: Family Medicine

## 2008-10-12 ENCOUNTER — Encounter: Payer: Self-pay | Admitting: Family Medicine

## 2008-10-16 ENCOUNTER — Encounter: Payer: Self-pay | Admitting: Family Medicine

## 2008-10-16 ENCOUNTER — Encounter (INDEPENDENT_AMBULATORY_CARE_PROVIDER_SITE_OTHER): Payer: Self-pay | Admitting: Family Medicine

## 2008-10-16 ENCOUNTER — Ambulatory Visit: Payer: Self-pay | Admitting: Family Medicine

## 2008-10-20 ENCOUNTER — Encounter: Payer: Self-pay | Admitting: Family Medicine

## 2008-12-16 ENCOUNTER — Encounter: Payer: Self-pay | Admitting: Family Medicine

## 2008-12-21 ENCOUNTER — Encounter: Payer: Self-pay | Admitting: Family Medicine

## 2008-12-23 ENCOUNTER — Telehealth: Payer: Self-pay | Admitting: Family Medicine

## 2008-12-25 ENCOUNTER — Encounter: Payer: Self-pay | Admitting: Family Medicine

## 2009-01-08 ENCOUNTER — Encounter: Payer: Self-pay | Admitting: Family Medicine

## 2009-01-11 ENCOUNTER — Ambulatory Visit: Payer: Self-pay | Admitting: Family Medicine

## 2009-01-11 LAB — CONVERTED CEMR LAB: Hgb A1c MFr Bld: 7.4 %

## 2009-01-12 ENCOUNTER — Encounter (INDEPENDENT_AMBULATORY_CARE_PROVIDER_SITE_OTHER): Payer: Self-pay | Admitting: *Deleted

## 2009-01-27 ENCOUNTER — Encounter: Payer: Self-pay | Admitting: Family Medicine

## 2009-02-16 ENCOUNTER — Encounter: Payer: Self-pay | Admitting: Family Medicine

## 2009-02-19 ENCOUNTER — Encounter: Payer: Self-pay | Admitting: Family Medicine

## 2009-02-25 ENCOUNTER — Telehealth (INDEPENDENT_AMBULATORY_CARE_PROVIDER_SITE_OTHER): Payer: Self-pay

## 2009-03-01 ENCOUNTER — Encounter: Payer: Self-pay | Admitting: Cardiology

## 2009-03-01 ENCOUNTER — Ambulatory Visit: Payer: Self-pay

## 2009-04-22 ENCOUNTER — Encounter: Payer: Self-pay | Admitting: Family Medicine

## 2009-04-22 ENCOUNTER — Ambulatory Visit: Payer: Self-pay | Admitting: Family Medicine

## 2009-04-22 LAB — CONVERTED CEMR LAB
ALT: 14 units/L (ref 0–35)
AST: 14 units/L (ref 0–37)
Albumin: 4.5 g/dL (ref 3.5–5.2)
Alkaline Phosphatase: 93 units/L (ref 39–117)
BUN: 12 mg/dL (ref 6–23)
CO2: 27 meq/L (ref 19–32)
Calcium: 10.8 mg/dL — ABNORMAL HIGH (ref 8.4–10.5)
Chloride: 102 meq/L (ref 96–112)
Creatinine, Ser: 0.55 mg/dL (ref 0.40–1.20)
Glucose, Bld: 86 mg/dL (ref 70–99)
Hgb A1c MFr Bld: 7.7 %
Potassium: 3.6 meq/L (ref 3.5–5.3)
Sodium: 144 meq/L (ref 135–145)
Total Bilirubin: 0.3 mg/dL (ref 0.3–1.2)
Total Protein: 7.8 g/dL (ref 6.0–8.3)

## 2009-04-23 ENCOUNTER — Encounter: Payer: Self-pay | Admitting: Family Medicine

## 2009-05-07 ENCOUNTER — Encounter: Payer: Self-pay | Admitting: Family Medicine

## 2009-05-13 ENCOUNTER — Encounter: Payer: Self-pay | Admitting: Family Medicine

## 2009-05-14 ENCOUNTER — Encounter: Payer: Self-pay | Admitting: Family Medicine

## 2009-06-23 ENCOUNTER — Encounter: Payer: Self-pay | Admitting: Family Medicine

## 2009-06-23 ENCOUNTER — Ambulatory Visit: Payer: Self-pay | Admitting: Family Medicine

## 2009-07-16 ENCOUNTER — Ambulatory Visit: Payer: Self-pay | Admitting: Family Medicine

## 2009-07-16 LAB — CONVERTED CEMR LAB: Hgb A1c MFr Bld: 7 %

## 2009-08-03 ENCOUNTER — Telehealth: Payer: Self-pay | Admitting: Family Medicine

## 2009-08-04 ENCOUNTER — Encounter (INDEPENDENT_AMBULATORY_CARE_PROVIDER_SITE_OTHER): Payer: Self-pay | Admitting: *Deleted

## 2009-08-06 ENCOUNTER — Encounter: Payer: Self-pay | Admitting: Family Medicine

## 2009-08-13 ENCOUNTER — Encounter: Payer: Self-pay | Admitting: Family Medicine

## 2009-08-13 ENCOUNTER — Telehealth: Payer: Self-pay | Admitting: Family Medicine

## 2009-08-20 ENCOUNTER — Ambulatory Visit: Payer: Self-pay | Admitting: Family Medicine

## 2009-08-27 ENCOUNTER — Ambulatory Visit: Payer: Self-pay | Admitting: Family Medicine

## 2009-08-27 LAB — CONVERTED CEMR LAB: Rapid Strep: NEGATIVE

## 2009-09-08 ENCOUNTER — Telehealth: Payer: Self-pay | Admitting: Family Medicine

## 2009-09-09 ENCOUNTER — Encounter: Payer: Self-pay | Admitting: Family Medicine

## 2009-09-09 ENCOUNTER — Ambulatory Visit: Payer: Self-pay | Admitting: Family Medicine

## 2009-09-10 ENCOUNTER — Ambulatory Visit: Payer: Self-pay | Admitting: Family Medicine

## 2009-10-11 ENCOUNTER — Ambulatory Visit (HOSPITAL_COMMUNITY): Admission: RE | Admit: 2009-10-11 | Discharge: 2009-10-11 | Payer: Self-pay | Admitting: Family Medicine

## 2009-10-12 ENCOUNTER — Ambulatory Visit: Payer: Self-pay | Admitting: Family Medicine

## 2009-10-26 ENCOUNTER — Telehealth (INDEPENDENT_AMBULATORY_CARE_PROVIDER_SITE_OTHER): Payer: Self-pay | Admitting: *Deleted

## 2009-10-29 ENCOUNTER — Ambulatory Visit: Payer: Self-pay | Admitting: Family Medicine

## 2009-10-29 LAB — CONVERTED CEMR LAB: Hgb A1c MFr Bld: 7.1 %

## 2009-11-30 ENCOUNTER — Encounter: Payer: Self-pay | Admitting: Family Medicine

## 2009-11-30 ENCOUNTER — Ambulatory Visit: Payer: Self-pay | Admitting: Family Medicine

## 2009-11-30 LAB — CONVERTED CEMR LAB: Direct LDL: 65 mg/dL

## 2009-12-01 ENCOUNTER — Encounter: Payer: Self-pay | Admitting: Family Medicine

## 2009-12-27 ENCOUNTER — Encounter: Payer: Self-pay | Admitting: *Deleted

## 2009-12-28 ENCOUNTER — Telehealth: Payer: Self-pay | Admitting: Family Medicine

## 2009-12-28 ENCOUNTER — Encounter: Payer: Self-pay | Admitting: Family Medicine

## 2010-02-21 ENCOUNTER — Encounter: Payer: Self-pay | Admitting: Family Medicine

## 2010-02-21 ENCOUNTER — Ambulatory Visit: Payer: Self-pay | Admitting: Family Medicine

## 2010-02-21 DIAGNOSIS — E162 Hypoglycemia, unspecified: Secondary | ICD-10-CM | POA: Insufficient documentation

## 2010-02-21 DIAGNOSIS — R1310 Dysphagia, unspecified: Secondary | ICD-10-CM | POA: Insufficient documentation

## 2010-02-21 LAB — CONVERTED CEMR LAB
Direct LDL: 75 mg/dL
Hgb A1c MFr Bld: 7.2 %

## 2010-03-01 ENCOUNTER — Ambulatory Visit: Payer: Self-pay | Admitting: Family Medicine

## 2010-03-10 ENCOUNTER — Encounter: Payer: Self-pay | Admitting: Family Medicine

## 2010-03-15 ENCOUNTER — Encounter: Payer: Self-pay | Admitting: *Deleted

## 2010-03-25 ENCOUNTER — Ambulatory Visit: Payer: Self-pay | Admitting: Family Medicine

## 2010-04-04 ENCOUNTER — Encounter: Payer: Self-pay | Admitting: Family Medicine

## 2010-04-04 DIAGNOSIS — J45909 Unspecified asthma, uncomplicated: Secondary | ICD-10-CM | POA: Insufficient documentation

## 2010-04-04 HISTORY — DX: Unspecified asthma, uncomplicated: J45.909

## 2010-04-15 ENCOUNTER — Ambulatory Visit: Payer: Self-pay | Admitting: Family Medicine

## 2010-04-15 DIAGNOSIS — K3184 Gastroparesis: Secondary | ICD-10-CM | POA: Insufficient documentation

## 2010-04-21 ENCOUNTER — Telehealth: Payer: Self-pay | Admitting: Family Medicine

## 2010-05-13 ENCOUNTER — Encounter: Payer: Self-pay | Admitting: Family Medicine

## 2010-05-13 ENCOUNTER — Ambulatory Visit: Payer: Self-pay | Admitting: Family Medicine

## 2010-05-13 DIAGNOSIS — M79609 Pain in unspecified limb: Secondary | ICD-10-CM | POA: Insufficient documentation

## 2010-05-13 LAB — CONVERTED CEMR LAB
ALT: 13 units/L (ref 0–35)
AST: 13 units/L (ref 0–37)
Albumin: 4.4 g/dL (ref 3.5–5.2)
Alkaline Phosphatase: 98 units/L (ref 39–117)
BUN: 11 mg/dL (ref 6–23)
CO2: 26 meq/L (ref 19–32)
Calcium: 10.6 mg/dL — ABNORMAL HIGH (ref 8.4–10.5)
Chloride: 102 meq/L (ref 96–112)
Creatinine, Ser: 0.52 mg/dL (ref 0.40–1.20)
Glucose, Bld: 145 mg/dL — ABNORMAL HIGH (ref 70–99)
Hgb A1c MFr Bld: 7.3 %
Potassium: 3.9 meq/L (ref 3.5–5.3)
Sodium: 140 meq/L (ref 135–145)
Total Bilirubin: 0.3 mg/dL (ref 0.3–1.2)
Total Protein: 7.5 g/dL (ref 6.0–8.3)

## 2010-05-15 ENCOUNTER — Encounter: Payer: Self-pay | Admitting: Family Medicine

## 2010-05-16 ENCOUNTER — Ambulatory Visit: Payer: Self-pay | Admitting: Family Medicine

## 2010-06-16 ENCOUNTER — Telehealth (INDEPENDENT_AMBULATORY_CARE_PROVIDER_SITE_OTHER): Payer: Self-pay | Admitting: *Deleted

## 2010-06-17 ENCOUNTER — Telehealth: Payer: Self-pay | Admitting: *Deleted

## 2010-06-20 ENCOUNTER — Ambulatory Visit: Payer: Self-pay | Admitting: Family Medicine

## 2010-06-30 ENCOUNTER — Encounter: Payer: Self-pay | Admitting: Family Medicine

## 2010-07-14 ENCOUNTER — Ambulatory Visit: Admission: RE | Admit: 2010-07-14 | Discharge: 2010-07-14 | Payer: Self-pay | Source: Home / Self Care

## 2010-07-14 ENCOUNTER — Encounter: Payer: Self-pay | Admitting: Family Medicine

## 2010-07-14 LAB — CONVERTED CEMR LAB
HCT: 35.6 % — ABNORMAL LOW (ref 36.0–46.0)
Hemoglobin: 11.6 g/dL — ABNORMAL LOW (ref 12.0–15.0)
MCHC: 32.6 g/dL (ref 30.0–36.0)
MCV: 87.9 fL (ref 78.0–100.0)
Platelets: 354 10*3/uL (ref 150–400)
RBC: 4.05 M/uL (ref 3.87–5.11)
RDW: 14.3 % (ref 11.5–15.5)
TSH: 1.524 microintl units/mL (ref 0.350–4.500)
Total CK: 94 units/L (ref 7–177)
Vit D, 25-Hydroxy: 21 ng/mL — ABNORMAL LOW (ref 30–89)
WBC: 7.3 10*3/uL (ref 4.0–10.5)

## 2010-07-19 ENCOUNTER — Encounter: Payer: Self-pay | Admitting: Family Medicine

## 2010-07-19 ENCOUNTER — Ambulatory Visit: Admission: RE | Admit: 2010-07-19 | Discharge: 2010-07-19 | Payer: Self-pay | Source: Home / Self Care

## 2010-08-02 NOTE — Assessment & Plan Note (Signed)
Summary: f/u eo   Vital Signs:  Patient profile:   55 year old female Height:      63 inches Weight:      167.2 pounds BMI:     29.73 Temp:     98.2 degrees F oral Pulse rate:   67 / minute BP sitting:   149 / 77  (right arm) Cuff size:   regular  Vitals Entered By: Gladstone Pih (Nov 30, 2009 10:25 AM)  CC: F/U for f/u diabetes, htn, hld Is Patient Diabetic? Yes Did you bring your meter with you today? No Pain Assessment Patient in pain? no        Primary Care Provider:  Asher Muir MD  CC:  F/U for f/u diabetes, htn, and hld.  History of Present Illness: 1.  diabetes--brings sugar log since last visit.  am fastings range from 66-258.  most are in goal range.  pre-lunch ranges from "LO" to 376, but most within goal range as well.  pre-dinner ranges from 60-244.  has a number of pre-dinner sugars in the 200s.  bedtime ranges from 64-240.  has 5 night-time episodes of hypoglycemia ranging from 29-52.  she states she is fairly certain that these all ocurred on nights when she forgot to eat a bedtime snack.    2.  hypertension--initially high, but then 128/72 on manual recheck.  on amlodipine, metop, hctz, losartan  3.  hyperlipidemia--due for ldl.  on zocor 80  Habits & Providers  Alcohol-Tobacco-Diet     Tobacco Status: never  Allergies: 1)  ! Sulfa 2)  ! * Altace  Review of Systems  The patient denies fever, weight loss, weight gain, chest pain, and prolonged cough.    Physical Exam  General:  Well-developed,well-nourished,in no acute distress; alert,appropriate and cooperative throughout examination Lungs:  Normal respiratory effort, chest expands symmetrically. Lungs are clear to auscultation, no crackles or wheezes. Heart:  Normal rate and regular rhythm. S1 and S2 normal without gallop, murmur, click, rub or other extra sounds. Extremities:  2+ dp pulses.  no foot lesions Additional Exam:  vital signs reviewed    Impression &  Recommendations:  Problem # 1:  DIABETES MELLITUS, II, COMPLICATIONS (ICD-250.92) Assessment Unchanged  No changes today.  for the low sugars, I think that consistently having a snack will help.  she still is having some highs, mostly -pre-dinner.  Howver I will not increase her insulin today because I am concerned that this will cause lows.  Will review log with Dr Raymondo Band to ensure that he agrees, and will call pt if we decide to make any adjustments. Her updated medication list for this problem includes:    Bayer Childrens Aspirin 81 Mg Chew (Aspirin) .Marland Kitchen... Take 1 tablet by mouth once a day    Glucophage 1000 Mg Tabs (Metformin hcl) .Marland Kitchen... 1 tablet by mouth twice a day    Lantus 100 Unit/ml Soln (Insulin glargine) ..... Inject 9 units in the am and 10 units in the pm.   prior to breakfast and prior to evenng meal.   dispense qs x 55month    Novolog 100 Unit/ml Soln (Insulin aspart) ..... Sliding scale - zero for bg<100, 4 units for 100-150, 6 units for 151-250, 8 units for >250.  3 times daily prior . dispense 2 vials per month.    Losartan Potassium 50 Mg Tabs (Losartan potassium) ..... Once daily in the morning.  for blood pressure.  Orders: [facility]- Est  Level 4 (63016)  Problem #  2:  HYPERCHOLESTEROLEMIA (ICD-272.0) Assessment: Unchanged check ldl Her updated medication list for this problem includes:    Simvastatin 80 Mg Tabs (Simvastatin) .Marland Kitchen... 1 by mouth at bedtime  Orders: T-Lipoprotein (LDL cholesterol)  ([address]-60454) [facility]- Est  Level 4 (09811)  Problem # 3:  HYPERTENSION, BENIGN SYSTEMIC (ICD-401.1) Assessment: Unchanged  at goal on manual recheck Her updated medication list for this problem includes:    Amlodipine Besylate 10 Mg Tabs (Amlodipine besylate) .Marland Kitchen... Take 1 tablet by mouth once a day    Metoprolol Tartrate 100 Mg Tabs (Metoprolol tartrate) .Marland Kitchen... 1 by mouth two times a day    Hydrochlorothiazide 25 Mg Tabs (Hydrochlorothiazide) .Marland Kitchen... 1/2 tablets daily    Losartan  Potassium 50 Mg Tabs (Losartan potassium) ..... Once daily in the morning.  for blood pressure.  Orders: [facility]- Est  Level 4 (91478)  Complete Medication List: 1)  Amlodipine Besylate 10 Mg Tabs (Amlodipine besylate) .... Take 1 tablet by mouth once a day 2)  Bayer Childrens Aspirin 81 Mg Chew (Aspirin) .... Take 1 tablet by mouth once a day 3)  Glucophage 1000 Mg Tabs (Metformin hcl) .Marland Kitchen.. 1 tablet by mouth twice a day 4)  Metoprolol Tartrate 100 Mg Tabs (Metoprolol tartrate) .Marland Kitchen.. 1 by mouth two times a day 5)  Neurontin 300 Mg Caps (Gabapentin) .Marland Kitchen.. 1 pill 4x/day 6)  Lantus 100 Unit/ml Soln (Insulin glargine) .... Inject 9 units in the am and 10 units in the pm.   prior to breakfast and prior to evenng meal.   dispense qs x 70month 7)  Miralax Powd (Polyethylene glycol 3350) .Marland Kitchen.. 17g by mouth once daily as needed for constipation.  mix w/ 8 oz of water.  disp 1 large container 8)  Fluticasone Propionate 50 Mcg/act Susp (Fluticasone propionate) .... 2 sprays each nostril once daily 9)  Simvastatin 80 Mg Tabs (Simvastatin) .Marland Kitchen.. 1 by mouth at bedtime 10)  Ranitidine Hcl 150 Mg Caps (Ranitidine hcl) .... Two times a day. 11)  Novolog 100 Unit/ml Soln (Insulin aspart) .... Sliding scale - zero for bg<100, 4 units for 100-150, 6 units for 151-250, 8 units for >250.  3 times daily prior . dispense 2 vials per month. 12)  Triamcinolone Acetonide 0.1 % Oint (Triamcinolone acetonide) .... Apply to abdomen once or twice a day.  dispense one large tube 13)  Hydrochlorothiazide 25 Mg Tabs (Hydrochlorothiazide) .... 1/2 tablets daily 14)  Bd Insulin Syringe Ultrafine 30g X 1/2" 1 Ml Misc (Insulin syringe-needle u-100) .... Use for 5 insulin injections daily.  dispense one month supply.  refill prn 15)  Mupirocin 2 % Oint (Mupirocin) .... Apply to affected area - under right breast. three times daily then as needed.  dispense 30 gram tube. 16)  Losartan Potassium 50 Mg Tabs (Losartan potassium) .... Once daily  in the morning.  for blood pressure.  Patient Instructions: 1)  It was nice to see you today. 2)  I think you are doing well with your diabetes. 3)  Try to remember your morning snack and bedtime snack. 4)  Let's leave your medicines the same. 5)  I will see you in June for your physical.   Prevention & Chronic Care Immunizations   Influenza vaccine: Fluvax 3+  (07/16/2009)   Influenza vaccine due: 06/13/2008    Tetanus booster: 12/06/2003: Done.   Tetanus booster due: 12/05/2013    Pneumococcal vaccine: Done.  (04/03/2003)   Pneumococcal vaccine due: None  Colorectal Screening   Hemoccult: Not documented   Hemoccult  due: Not Indicated    Colonoscopy: Results: Internal Hemorrhoids.     Location:  [facility].     (01/31/2008)   Colonoscopy due: 01/30/2018  Other Screening   Pap smear: NEGATIVE FOR INTRAEPITHELIAL LESIONS OR MALIGNANCY.  (10/16/2008)   Pap smear due: 10/17/2011    Mammogram: No specific mammographic evidence of malignancy.    (10/12/2009)   Mammogram action/deferral: Screening mammogram in 1 year.     (10/08/2006)   Mammogram due: 10/2010   Smoking status: never  (11/30/2009)  Diabetes Mellitus   HgbA1C: 7.1  (10/29/2009)   Hemoglobin A1C due: 03/12/2008    Eye exam: diabetic retinopathy  (01/01/2007)   Eye exam due: 01/01/2008    Foot exam: yes  (10/16/2008)   High risk foot: Not documented   Foot care education: Not documented   Foot exam due: 02/06/2009    Urine microalbumin/creatinine ratio: Not documented   Urine microalbumin/cr due: 10/15/2007    Diabetes flowsheet reviewed?: Yes   Progress toward A1C goal: At goal  Lipids   Total Cholesterol: 157  (08/20/2008)   Lipid panel action/deferral: LDL Direct Ordered   LDL: 77  (08/20/2008)   LDL Direct: 96  (12/11/2007)   HDL: 69  (08/20/2008)   Triglycerides: 57  (08/20/2008)    SGOT (AST): 14  (04/22/2009)   SGPT (ALT): 14  (04/22/2009)   Alkaline phosphatase: 93   (04/22/2009)   Total bilirubin: 0.3  (04/22/2009)    Lipid flowsheet reviewed?: Yes   Progress toward LDL goal: At goal  Hypertension   Last Blood Pressure: 149 / 77  (11/30/2009)   Serum creatinine: 0.55  (04/22/2009)   Serum potassium 3.6  (04/22/2009)    Hypertension flowsheet reviewed?: Yes   Progress toward BP goal: At goal   Hypertension comments: at goal on manual recheck (128/72)  Self-Management Support :   Personal Goals (by the next clinic visit) :     Personal A1C goal: 8  (04/22/2009)     Personal blood pressure goal: 130/80  (04/22/2009)     Personal LDL goal: 100  (04/22/2009)    Diabetes self-management support: CBG self-monitoring log, Written self-care plan  (10/12/2009)    Diabetes self-management support not done because: Good outcomes  (04/22/2009)    Hypertension self-management support: Written self-care plan  (10/12/2009)    Hypertension self-management support not done because: Good outcomes  (10/29/2009)    Lipid self-management support: Not documented     Lipid self-management support not done because: Good outcomes  (10/29/2009)

## 2010-08-02 NOTE — Assessment & Plan Note (Signed)
Summary: ?injured rib/ACM   Vital Signs:  Patient Profile:   55 Years Old Female Height:     63 inches Weight:      178.8 pounds O2 Sat:      97 % Temp:     98.0 degrees F Pulse rate:   80 / minute BP sitting:   136 / 75  (left arm)  Pt. in pain?   yes    Location:   left side rib area  Vitals Entered By: Jacki Cones RN (May 12, 2008 3:02 PM)                  PCP:  Asher Muir MD  Chief Complaint:  left side rib area pain.  History of Present Illness: 55 yo F presents with left sided rib pain.  Patient states on Sunday she was playing with the kids on the bed.  She fell back and one of them landed on the left side of her chest causing immediate pain though no obvious pop felt in anterior ribs.  No previous h/o rib fractures.  Coughing and deep breaths hurt area but she is not having trouble breathing.  Has pain with movement also.  Bra compresses area and causes pain.  Feels slightly better compared to sunday.  Taking tylenol and advil with minimal benefit.    Current Allergies (reviewed today): ! SULFA ! * ALTACE SULFAMETHOXAZOLE (SULFAMETHOXAZOLE)      Physical Exam  General:     Well-developed,well-nourished,in no acute distress; alert,appropriate and cooperative throughout examination Chest Wall:     TTP anterior left 7th and 8th rib with what feels like a palpable stepoff in this area. Lungs:     Normal respiratory effort, chest expands symmetrically. Lung sounds throughout.  Few rhonchi throughout, no wheezes or rales. Heart:     Normal rate and regular rhythm. S1 and S2 normal without gallop, murmur, click, rub or other extra sounds.    Impression & Recommendations:  Problem # 1:  RIB PAIN, LEFT SIDED (ICD-786.50) Assessment: New Will obtain x-ray and treat accordingly.  If fractured, symptomatic treatment and rib binder - will call order to medical supply store.  Limit heavy lifting as well especially over next 3-4 weeks then as tolerated.   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  Vicodin as needed for pain - can take ibuprofen with this as well.  If contusion or muscle strain only, will not put any restrictions on her.  F/u in 4 weeks with PCP to see how she is doing and ensure improvement.  Orders: Diagnostic X-Ray/Fluoroscopy (Diagnostic X-Ray/Flu) [facility]- Est Level  3 (04540)   Complete Medication List: 1)  Amlodipine Besylate 10 Mg Tabs (Amlodipine besylate) .... Take 1 tablet by mouth once a day 2)  Bayer Childrens Aspirin 81 Mg Chew (Aspirin) .... Take 1 tablet by mouth once a day 3)  Glucophage 1000 Mg Tabs (Metformin hcl) .Marland Kitchen.. 1 tablet by mouth twice a day 4)  Metoclopramide Hcl 5 Mg Tabs (Metoclopramide hcl) .... Take 1 tablet by mouth as directed 5)  Metoprolol Tartrate 100 Mg Tabs (Metoprolol tartrate) .Marland Kitchen.. 1 by mouth two times a day 6)  Neurontin 300 Mg Caps (Gabapentin) .Marland Kitchen.. 1 pill 4x/day 7)  Lantus 100 Unit/ml Soln (Insulin glargine) .... Inject 30 units daily.  dispense qs x 64month 8)  Lisinopril-hydrochlorothiazide 10-12.5 Mg Tabs (Lisinopril-hydrochlorothiazide) .... One tab by mouth daily 9)  Miralax  Powd (Polyethylene glycol 3350) .Marland Kitchen.. 17g by mouth once daily as needed for constipation.  mix w/ 8 oz of water.  disp 1 large container 10)  Fluticasone Propionate 50 Mcg/act Susp (Fluticasone propionate) .... 2 sprays each nostril once daily 11)  Albuterol 90 Mcg/act Aers (Albuterol) .... 2 puffs q 4 hours prn 12)  Simvastatin 80 Mg Tabs (Simvastatin) .Marland Kitchen.. 1 by mouth at bedtime 13)  Ranitidine Hcl 150 Mg Caps (Ranitidine hcl) .... Two times a day. 14)  Nystatin-triamcinolone 100000-0.1 Unit/gm-% Oint (Nystatin-triamcinolone) .... Apply to affected area two times a day-tid 15)  Vicodin 5-500 Mg Tabs (Hydrocodone-acetaminophen) .Marland Kitchen.. 1-2 tabs by mouth q6h as needed severe pain   Patient Instructions: 1)  Take the vicodin every 6 hours as  needed for severe pain. 2)  Get the x-ray of your left ribs to see if they are broken - if they are, we will fax an order for a rib binder for you - these usually take about 8 weeks to heal and if it is broken I don't want you lifting anything more than 5 pounds (gallon of milk) for at least 4 weeks - then you can do this as tolerated. 3)  You can take ibuprofen as well if you need something else for pain but do not take tylenol because vicodin has tylenol in it. 4)  Follow up with your doctor in 4 weeks.   Prescriptions: VICODIN 5-500 MG TABS (HYDROCODONE-ACETAMINOPHEN) 1-2 tabs by mouth q6h as needed severe pain  #50 x 0   Entered and Authorized by:   Norton Blizzard MD   Signed by:   Norton Blizzard MD on 05/12/2008   Method used:   Print then Give to Patient   RxID:   [id number]  ]

## 2010-08-02 NOTE — Assessment & Plan Note (Signed)
Summary: pap wp   Vital Signs:  Patient Profile:   55 Years Old Female Height:     63 inches Weight:      183.1 pounds Temp:     98.3 degrees F Pulse rate:   87 / minute BP sitting:   131 / 77  (left arm)  Pt. in pain?   no  Vitals Entered By: Alphia Kava (December 11, 2007 4:14 PM)              Is Patient Diabetic? Yes      Chief Complaint:  cpe and pap.  History of Present Illness: 55 yo woman who RTC today for 1) CPE- Pt needs pap, had mammo, needs to discuss colon cancer screening 2) DM- Pt due for A1C.  Denies hypoglycemic episodes, CP, SOB, N/V, edema.  Per pt report, CBGs are 'fine' at home 3) Bilateral shoulder pain- pt states this pain has been ongoing for 2 years.  States it is not muscle pain but rather bony pain.  States her motion is limited and she is unable to hold her friend's new baby.  [place] Spaniel she has mentioned this to other docs but they 'dismiss' her.  No pain in her lower extremities or back.  Nothing makes pain better but motion makes it worse.  Pt uses sign language to communicate and she fears this will limit her ability to communicate if her motion becomes too limited. 4) Elevated cholesterol- Pt taking statin w/out difficulty.  Due for direct LDL    Prior Medications Reviewed Using: Patient Recall  Current Allergies: ! SULFA ! * ALTACE SULFAMETHOXAZOLE (SULFAMETHOXAZOLE)  Past Medical History:    Reviewed history from 12/07/2006 and no changes required:        5/06 [name]- 5.8cm ant body fibroid s/p embolization,        anisocoria since childhood,        [place] Deafness- 55yo meningitis,        diabetic gastroparesis, diabetic retinopathy, has had ha assessments, including MRI- unrevealing  Past Surgical History:    Reviewed history from 03/15/2007 and no changes required:       12/06 Uterine fibroids embolization - 10/12/2005       2-D Echo - nl 5/04 due to murmur - 11/20/2002       Appendectomy        cardiolite-EF77%no ischemia - 04/06/2005  cesarian section x2        ECG NSR, NAD, no ST/TW changes - 12/14/2003       ETT -intermediate Risk significant CAD-McDiarmid - 02/27/2005       Tubal ligation    Family History:    HTN mother    +ovarian CA grandmother    mother and sister have uterine fibroids    no CAD/MI/DM, sudden death, no hypercholesterolemia    Review of Systems      See HPI   Physical Exam  General:     Well-developed,well-nourished,in no acute distress; alert,appropriate and cooperative throughout examination Head:     Normocephalic and atraumatic without obvious abnormalities. No apparent alopecia or balding. Eyes:     pupils round, pupils reactive to light, and anisocoria.   Ears:     External ear exam shows no significant lesions or deformities.  Otoscopic examination reveals clear canals, tympanic membranes are intact bilaterally without bulging, retraction, inflammation or discharge. Hearing is grossly normal bilaterally. Nose:     External nasal examination shows no deformity or inflammation. Nasal mucosa are pink and moist  without lesions or exudates. Mouth:     Oral mucosa and oropharynx without lesions or exudates.  Teeth in good repair with multiple fillings. Neck:     No deformities, masses, or tenderness noted. Breasts:     No mass, nodules, thickening, tenderness, bulging, retraction, inflamation, nipple discharge or skin changes noted.   Lungs:     Normal respiratory effort, chest expands symmetrically. Lungs are a little coarse, and she coughs reproducibly with deep inspiration. Heart:     Normal rate and regular rhythm. S1 and S2 normal without gallop, murmur, click, rub or other extra sounds. Abdomen:     Bowel sounds positive,abdomen soft and non-tender without masses, organomegaly or hernias noted. Genitalia:     Pelvic Exam:        External: normal female genitalia without lesions or masses        Vagina: normal without lesions or masses        Cervix: normal without lesions  or masses        Adnexa: normal bimanual exam without masses or fullness        Uterus: normal by palpation        Pap smear: performed Msk:     Pain w/ forward flexion of shoulders at 100 degrees.  Limited internal rotation.  Pain w/ abduction at 90 degrees.  Full ROM of hips and knees. Pulses:     +2 DP and radial pulses Extremities:     No clubbing, cyanosis, edema, or deformity noted  Neurologic:     No cranial nerve deficits noted. Station and gait are normal. Plantar reflexes are down-going bilaterally. DTRs are symmetrical throughout. Sensory, motor and coordinative functions appear intact. Skin:     Intact without suspicious lesions or rashes Cervical Nodes:     No lymphadenopathy noted Axillary Nodes:     No palpable lymphadenopathy    Impression & Recommendations:  Problem # 1:  SHOULDER, PAIN (ICD-719.41) Assessment: New Pt w/ 2 yr hx of pain and now w/ obvious limited motion on exam.  Motion limited by pain.  Has not had previous workup done.  Not having hip girdle pain or weakness.  Must consider PMR.  Will check labs as below and order shoulder films.  Pt to take tylenol and NSAIDs as needed for pain.  Will have her back in a few weeks to review lab and radiology results. Her updated medication list for this problem includes:    Bayer Childrens Aspirin 81 Mg Chew (Aspirin) .Marland Kitchen... Take 1 tablet by mouth once a day    Voltaren 75 Mg Tbec (Diclofenac sodium) .Marland Kitchen... Take 1 tablet by mouth twice a day  Orders: CK (Creatine Kinase)-[facility] [id number]) Rheum Fact-[facility] (09811) Sed Rate (ESR)-[facility] (85651) [facility] - Est  40-64 yrs (91478)   Problem # 2:  DIABETES MELLITUS, II, COMPLICATIONS (ICD-250.92) Assessment: Unchanged Pt's A1C slightly higher than last time.  Pt on minimal medication and trying to avoid mealtime insulin.  Will increase Glipizide from 5-->10mg  daily.  Pt expresses understanding and is in agreement w/ this plan. The following medications were removed from the  medication list:    Glipizide 5 Mg Tb24 (Glipizide) .Marland Kitchen... Take 1 tablet by mouth once a day  Her updated medication list for this problem includes:    Bayer Childrens Aspirin 81 Mg Chew (Aspirin) .Marland Kitchen... Take 1 tablet by mouth once a day    Glucophage 1000 Mg Tabs (Metformin hcl) .Marland Kitchen... 1 tablet by mouth twice a day  Lantus 100 Unit/ml Soln (Insulin glargine) ..... Inject 30 units daily.  dispense qs x 53month    Lisinopril-hydrochlorothiazide 10-12.5 Mg Tabs (Lisinopril-hydrochlorothiazide) ..... One tab by mouth daily    Glipizide Xl 10 Mg Tb24 (Glipizide) .Marland Kitchen... 1 tablet by mouth daily  Orders: A1C-[facility] (16109)   Problem # 3:  GYNECOLOGICAL EXAMINATION, ROUTINE (ICD-V72.31) Assessment: Unchanged Pt's CPE WNL w/ exception of shoulder pain noted above.  Will set pt up w/ GI for colonoscopy. Orders: [facility] - Est  40-64 yrs (60454) Gastroenterology Referral (GI)   Problem # 4:  SCREENING FOR MALIGNANT NEOPLASM OF THE CERVIX (ICD-V76.2) Assessment: Unchanged Pap collected today. Orders: Pap Smear-[facility] ([id number]) [facility] - Est  40-64 yrs (29562)   Problem # 5:  HYPERCHOLESTEROLEMIA (ICD-272.0) Assessment: Unchanged Pt taking statin w/out apparent difficulty although will check CMP given pt's shoulder pain-? myalgias.  Will check LDL as pt is due, will adjust meds as needed. Her updated medication list for this problem includes:    Simvastatin 40 Mg Tabs (Simvastatin) .Marland Kitchen... 1 tab by mouth at bedtime.  Orders: Direct LDL-[facility] [id number]) Comp Met-[facility] [phone]) Radiology other (Radiology Other) [facility] - Est  40-64 yrs (24401)   Complete Medication List: 1)  Amlodipine Besylate 10 Mg Tabs (Amlodipine besylate) .... Take 1 tablet by mouth once a day 2)  Bayer Childrens Aspirin 81 Mg Chew (Aspirin) .... Take 1 tablet by mouth once a day 3)  Glucophage 1000 Mg Tabs (Metformin hcl) .Marland Kitchen.. 1 tablet by mouth twice a day 4)  Metoclopramide Hcl 5 Mg Tabs (Metoclopramide hcl) .... Take 1 tablet  by mouth as directed 5)  Metoprolol Tartrate 100 Mg Tabs (Metoprolol tartrate) .Marland Kitchen.. 1 by mouth two times a day 6)  Voltaren 75 Mg Tbec (Diclofenac sodium) .... Take 1 tablet by mouth twice a day 7)  Neurontin 300 Mg Caps (Gabapentin) .Marland Kitchen.. 1 pill 4x/day 8)  Lantus 100 Unit/ml Soln (Insulin glargine) .... Inject 30 units daily.  dispense qs x 53month 9)  Lisinopril-hydrochlorothiazide 10-12.5 Mg Tabs (Lisinopril-hydrochlorothiazide) .... One tab by mouth daily 10)  Simvastatin 40 Mg Tabs (Simvastatin) .Marland Kitchen.. 1 tab by mouth at bedtime. 11)  Tessalon 200 Mg Caps (Benzonatate) .Marland Kitchen.. 1 by mouth three times a day as needed for cough 12)  Nystatin-triamcinolone 100000-0.1 Unit/gm-% Oint (Nystatin-triamcinolone) .... Apply to affected area 2-3x/day.  disp 30 gm tube 13)  Miralax Powd (Polyethylene glycol 3350) .Marland Kitchen.. 17g by mouth once daily as needed for constipation.  mix w/ 8 oz of water.  disp 1 large container 14)  Fluticasone Propionate 50 Mcg/act Susp (Fluticasone propionate) .... 2 sprays each nostril once daily 15)  Albuterol 90 Mcg/act Aers (Albuterol) .... 2 puffs q 4 hours prn 16)  Keflex 500 Mg Caps (Cephalexin) .Marland Kitchen.. 1 tab by mouth three times a day x 5 days 17)  Glipizide Xl 10 Mg Tb24 (Glipizide) .Marland Kitchen.. 1 tablet by mouth daily   Patient Instructions: 1)  Please schedule a follow-up appointment in 2-3 weeks to review xrays and labs. 2)  Increase the Glipizide to 2 pills daily- your new prescription will be 10mg  and you will only need to take 1 pill daily 3)  Hang in there- we'll investigate this shoulder pain and come up with a plan!   Prescriptions: GLIPIZIDE XL 10 MG TB24 (GLIPIZIDE) 1 tablet by mouth daily  #30 x 6   Entered and Authorized by:   Neena Rhymes  MD   Signed by:   Neena Rhymes  MD on 12/11/2007   Method  used:   Electronically sent to ...       [facility]  [address] [id number]       [address]       [place], [place]  [address]       Ph: [phone]  or [phone]       Fax: [phone]   RxID:   [id number]  ] Laboratory Results   Blood Tests   Date/Time Received: December 11, 2007 4:15 PM  Date/Time Reported: December 11, 2007 4:16 PM   HGBA1C: 7.7%   (Normal Range: Non-Diabetic - 3-6%   Control Diabetic - 6-8%)  CommentsLillia Pauls CMA  December 11, 2007 4:15 PM

## 2010-08-02 NOTE — Progress Notes (Signed)
Summary: triage  Phone Note Call from Patient Call back at [phone]   Caller: Patient Summary of Call: Please call. Initial call taken by: Clydell Hakim,  April 21, 2010 9:20 AM  Follow-up for Phone Call         sorenson relay states no one picked up the line. I LM asking pt to call back  Follow-up by: Golden Circle RN,  April 21, 2010 10:05 AM  Additional Follow-up for Phone Call Additional follow up Details #1::        she did not remeber what the md told her about the lantus & novalog. I reviewed it with her & asked her to eat less fats. eat more vegt & lean meat & exercise daily. states she understands Additional Follow-up by: Golden Circle RN,  April 21, 2010 10:16 AM

## 2010-08-02 NOTE — Assessment & Plan Note (Signed)
Summary: fu wk   Vital Signs:  Patient Profile:   55 Years Old Female Height:     63 inches Weight:      185.3 pounds BMI:     32.94 Temp:     97.2 degrees F Pulse rate:   82 / minute BP sitting:   122 / 78  (left arm)  Pt. in pain?   no  Vitals Entered By: Dedra Skeens CMA, (December 07, 2006 9:57 AM)                Chief Complaint:  CPE.  History of Present Illness: 1. For CPE. Currently asymptomatic. No concerns.  Diabetes Management History:      The patient is a 55 years old female who comes in for evaluation of DM Type 2.  She is (or has been) enrolled in the "Diabetic Education Program".  She is checking home blood sugars.  She says that she is not exercising regularly.        Hypoglycemic symptoms are not occurring.  No hyperglycemic symptoms are reported.  Other comments include: polyneuropathy symptoms improving.        There are no symptoms to suggest diabetic complications.  Since her last visit, no infections have occurred.  She's had no treatment plan problems.  No changes have been made to her treatment plan since last visit.        Her home fasting blood sugars are as follows: highest: 160; lowest: 80.     Current Allergies (reviewed today): ! SULFA ! * ALTACE  Past Medical History:     5/06 [name]- 5.8cm ant body fibroid s/p embolization,     anisocoria since childhood,     Bilat Deafness- 55yo meningitis,     diabetic gastroparesis, diabetic retinopathy, has had ha assessments, including MRI- unrevealing     Review of Systems  The patient denies anorexia, fever, weight loss, vision loss, chest pain, syncope, dyspnea on exhertion, peripheral edema, abdominal pain, melena, and hematochezia.     Physical Exam  General:     Well-developed,well-nourished,in no acute distress; alert,appropriate and cooperative throughout examination Head:     Normocephalic and atraumatic without obvious abnormalities. No apparent alopecia or balding. Eyes:     pupils  round, pupils reactive to light, no optic disk abnormalities, no retinal abnormalitiies, and anisocoria.   Ears:     External ear exam shows no significant lesions or deformities.  Otoscopic examination reveals clear canals, tympanic membranes are intact bilaterally without bulging, retraction, inflammation or discharge. Hearing is grossly normal bilaterally. Nose:     External nasal examination shows no deformity or inflammation. Nasal mucosa are pink and moist without lesions or exudates. Mouth:     Oral mucosa and oropharynx without lesions or exudates.  Teeth in good repair. Neck:     No deformities, masses, or tenderness noted. Breasts:     No mass, nodules, thickening, tenderness, bulging, retraction, inflamation, nipple discharge or skin changes noted.   Lungs:     Normal respiratory effort, chest expands symmetrically. Lungs are clear to auscultation, no crackles or wheezes. Heart:     Normal rate and regular rhythm. S1 and S2 normal without gallop, murmur, click, rub or other extra sounds. Abdomen:     Bowel sounds positive,abdomen soft and non-tender without masses, organomegaly or hernias noted. Genitalia:     Pelvic Exam:        External: normal female genitalia without lesions or masses  Vagina: normal without lesions or masses        Cervix: normal without lesions or masses        Adnexa: normal bimanual exam without masses or fullness        Uterus: normal by palpation        Pap smear: performed Msk:     No deformity or scoliosis noted of thoracic or lumbar spine.   Pulses:     R and L carotid,radial,femoral,dorsalis pedis and posterior tibial pulses are full and equal bilaterally Extremities:     No clubbing, cyanosis, edema, or deformity noted with normal full range of motion of all joints.   Neurologic:     No cranial nerve deficits noted. Station and gait are normal. Plantar reflexes are down-going bilaterally. DTRs are symmetrical throughout. Sensory, motor  and coordinative functions appear intact. Skin:     Intact without suspicious lesions or rashes Cervical Nodes:     No lymphadenopathy noted Axillary Nodes:     No palpable lymphadenopathy Inguinal Nodes:     No significant adenopathy Psych:     Cognition and judgment appear intact. Alert and cooperative with normal attention span and concentration. No apparent delusions, illusions, hallucinations    Impression & Recommendations:  Problem # 1:  SCREENING FOR MALIGNANT NEOPLASM, CERVIX (ICD-V76.2) Assessment: Comment Only Wet prep also performed. Orders: Pap Smear-[facility] ([id number]) [facility] - Est  40-64 yrs ([id number])   Problem # 2:  GYNECOLOGICAL EXAMINATION, ROUTINE (ICD-V72.31) Assessment: Comment Only Normal female. Mammogram normal 4/08. Colonoscopy disscussed , to be achedule. Orders: [facility] - Est  40-64 yrs ([id number])   Problem # 3:  DIABETES MELLITUS, II, COMPLICATIONS (ICD-250.92) Assessment: Unchanged Continue current regimen. Orders: A1C-[facility] ([id number])   Problem # 4:  HYPERTENSION, BENIGN SYSTEMIC (ICD-401.1) Assessment: Unchanged Well controlled.  Problem # 5:  HYPERCHOLESTEROLEMIA (ICD-272.0) Will check fasting lipids, CMP. Adjust therapy as needed.  [phone]  Diabetes Management Assessment/Plan:      The following lipid goals have been established for the patient: Total cholesterol goal of 200; LDL cholesterol goal of 100; HDL cholesterol goal of 40; Triglyceride goal of 200.  Her blood pressure goal is < 130/80.     Patient Instructions: 1)  Please schedule a follow-up appointment in 3 months. 2)  Check your blood sugars regularly. If your readings are usually above 200 or below 70 you should contact our office. 3)  Schedule appt for blood work fasting anytime.  Laboratory Results   Blood Tests   Date/Time Recieved: December 07, 2006 10:04 AM  Date/Time Reported: December 07, 2006 10:20 AM   HGBA1C: 7.5%   (Normal Range: Non-Diabetic  - 3-6%   Control Diabetic - 6-8%)  Comments: ...................................................................DONNA [facility]  December 07, 2006 10:20 AM   Date/Time Received: December 07, 2006 11:20 AM  Date/Time Reported: December 07, 2006 11:26 AM   Wet [place]/KOH Source: Vaginal WBC/hpf 1-5 Bacteria/hpf 3+  Rods Clue cells/hpf none  Negative whiff Yeast/hpf none Trichomonas/hpf none Comments ...................................................................DONNA [facility]  December 07, 2006 11:27 AM

## 2010-08-02 NOTE — Assessment & Plan Note (Signed)
Summary: f/up diabetes   Vital Signs:  Patient profile:   55 year old female Height:      63 inches Weight:      181 pounds BMI:     32.18 Temp:     98.7 degrees F Pulse rate:   80 / minute BP sitting:   140 / 76  (right arm) Cuff size:   large  Vitals Entered By: Dennison Nancy RN (January 11, 2009 9:11 AM) CC: follow-up visit/DM, feet swelling, stomach discomfort, chest tightness, bp Is Patient Diabetic? Yes  Pain Assessment Patient in pain? no        Primary Care Provider:  Asher Muir MD  CC:  follow-up visit/DM, feet swelling, stomach discomfort, chest tightness, and bp.  History of Present Illness: here for follow up visit.  discussed:  1.  DM--A1C 7.4 (down from 7.9)  on metformin 1000 two times a day and lantus 30 units daily.  does have some problems with low sugars at times.  in fact, had hypoglycemic episode during which her sugar did not register.  she was sweaty and had visual disturbance;  she ate/drank and her sugar came up.  did not bring sugar log.  2.  stomach discomfort--feels bloated at times.  when she takes ranitidine it is better, but her insurance will not pay for this since she can buy it otc.  taking miralax.  does have some constipation.    3.  chest tightness--discussed this at visit several mos ago, but this is different.  more of a tightness in upper chest.  no SOB, diaphoresis.  it is not exertional.  she cannot id any relieving or precipitating factors.  she states she had a stress test at the cardiology office (she believes) about 3 yrs ago.    4.  feet swelling--bilateral.  lasted for about one week.  noticed it when she was on vacation.  doesn't think she was necessarily eating more sodium.  better now.    5.  bp--over goal today at 140/76.  not taking lisinopril-hctz because pharmacy was out last time she tried to fill it.    Habits & Providers  Alcohol-Tobacco-Diet     Tobacco Status: never  Allergies: 1)  ! Sulfa 2)  ! * Altace 3)   Sulfamethoxazole (Sulfamethoxazole)  Physical Exam  General:  overweight, ,in no acute distress; alert,appropriate and cooperative throughout examination Chest Wall:  slight ttp over sternum Lungs:  Normal respiratory effort, chest expands symmetrically. Lungs are clear to auscultation, no crackles or wheezes. Heart:  RRR; ?2/6 SEM at LSB Abdomen:  Bowel sounds positive,abdomen soft and non-tender without masses, organomegaly or hernias noted. Msk:  feet without lesions Pulses:  2+ dp pulses Extremities:  no edema Additional Exam:  vital signs reviewed; bp a little above goal   Impression & Recommendations:  Problem # 1:  DIABETES MELLITUS, II, COMPLICATIONS (ICD-250.92) Assessment Unchanged close to goal.  with her reports of going low, I am hesitant to increase her lantus.  encouraged her to eat throughout the day.  encouraged exercise as well.  May want to consider nutritional counseling in future; although we did not discuss this today Her updated medication list for this problem includes:    Bayer Childrens Aspirin 81 Mg Chew (Aspirin) .Marland Kitchen... Take 1 tablet by mouth once a day    Glucophage 1000 Mg Tabs (Metformin hcl) .Marland Kitchen... 1 tablet by mouth twice a day    Lantus 100 Unit/ml Soln (Insulin glargine) ..... Inject 30 units  daily.  dispense qs x 685month    Lisinopril-hydrochlorothiazide 10-12.5 Mg Tabs (Lisinopril-hydrochlorothiazide) ..... One tab by mouth daily  Orders: A1C-[facility] ([id number]) [facility]- Est  Level 4 ([id number])  Problem # 2:  GERD (ICD-530.81) Assessment: Deteriorated  Not certain that her abd sxs are GERD.  They seem more consistent with constipation.  However, she reports that they improved with ranitidine.  gave her list of otcs she could purchase.  also to continue miralax. Her updated medication list for this problem includes:    Ranitidine Hcl 150 Mg Caps (Ranitidine hcl) .Marland Kitchen..Marland Kitchen Two times a day.  Orders: [facility]- Est  Level 4 (46962)  Problem # 3:  CHEST PAIN, ATYPICAL  (ICD-786.59) Assessment: Deteriorated  the squeezing nature of this pain is a little concerning; but non-exertional, no SOB, no diaphoresis.  also has lots of risk factors.  Pt  can likely tolerate the exercise of a treadmill test.  I cannot find any stress test/cardiolite in past 2.5 years.  think judicious to go ahead with treadmill test.    Orders: [facility]- Est  Level 4 ([id number])  Problem # 4:  HYPERTENSION, BENIGN SYSTEMIC (ICD-401.1) Assessment: Deteriorated  over goal today; likely from not taking lisinopril-hctz.  restart lisinopril-hctz.  has had good control with this med.  Her updated medication list for this problem includes:    Amlodipine Besylate 10 Mg Tabs (Amlodipine besylate) .Marland Kitchen... Take 1 tablet by mouth once a day    Metoprolol Tartrate 100 Mg Tabs (Metoprolol tartrate) .Marland Kitchen... 1 by mouth two times a day    Lisinopril-hydrochlorothiazide 10-12.5 Mg Tabs (Lisinopril-hydrochlorothiazide) ..... One tab by mouth daily  Orders: [facility]- Est  Level 4 (99214)  Problem # 5:  OBESITY (ICD-278.00) Assessment: Unchanged encouraged exercise  Complete Medication List: 1)  Amlodipine Besylate 10 Mg Tabs (Amlodipine besylate) .... Take 1 tablet by mouth once a day 2)  Bayer Childrens Aspirin 81 Mg Chew (Aspirin) .... Take 1 tablet by mouth once a day 3)  Glucophage 1000 Mg Tabs (Metformin hcl) .Marland Kitchen.. 1 tablet by mouth twice a day 4)  Metoclopramide Hcl 5 Mg Tabs (Metoclopramide hcl) .... Take 1 tablet by mouth as directed 5)  Metoprolol Tartrate 100 Mg Tabs (Metoprolol tartrate) .Marland Kitchen.. 1 by mouth two times a day 6)  Neurontin 300 Mg Caps (Gabapentin) .Marland Kitchen.. 1 pill 4x/day 7)  Lantus 100 Unit/ml Soln (Insulin glargine) .... Inject 30 units daily.  dispense qs x 685month 8)  Lisinopril-hydrochlorothiazide 10-12.5 Mg Tabs (Lisinopril-hydrochlorothiazide) .... One tab by mouth daily 9)  Miralax Powd (Polyethylene glycol 3350) .Marland Kitchen.. 17g by mouth once daily as needed for constipation.  mix w/ 8 oz of water.   disp 1 large container 10)  Fluticasone Propionate 50 Mcg/act Susp (Fluticasone propionate) .... 2 sprays each nostril once daily 11)  Albuterol 90 Mcg/act Aers (Albuterol) .... 2 puffs q 4 hours prn 12)  Simvastatin 80 Mg Tabs (Simvastatin) .Marland Kitchen.. 1 by mouth at bedtime 13)  Ranitidine Hcl 150 Mg Caps (Ranitidine hcl) .... Two times a day. 14)  Nystatin-triamcinolone 100000-0.1 Unit/gm-% Oint (Nystatin-triamcinolone) .... Apply to affected area two times a day-tid  Patient Instructions: 1)  It was nice to see you today.  2)  Please schedule a follow-up appointment in 3 months for diabetes and blood pressure.  3)  Please schedule an exercise treadmill test so that we can evaluate your chest pain better. 4)  For your blood pressure, start taking your lisinopril-hctz again.  I sent a refill to your pharmacy. 5)  For  your stomach, you can take over-the-counter nexium, omeprazole, or prilosec ([facility] and [facility] have their own brands). 6)  Remember to eat throughout the day so that your blood sugar does not go low.  Prescriptions: LISINOPRIL-HYDROCHLOROTHIAZIDE 10-12.5 MG  TABS (LISINOPRIL-HYDROCHLOROTHIAZIDE) One tab by mouth daily  #30 Capsule x 6   Entered and Authorized by:   Asher Muir MD   Signed by:   Asher Muir MD on 01/11/2009   Method used:   Electronically to        [facility]  [address] [id number] ([facility])       [address]       [place], [place]  [address]       Ph: [phone] or [phone]       Fax: [phone]   RxID:   [id number]   Laboratory Results   Blood Tests   Date/Time Received: January 11, 2009 8:54 AM  Date/Time Reported: January 11, 2009 9:04 AM   HGBA1C: 7.4%   (Normal Range: Non-Diabetic - 3-6%   Control Diabetic - 6-8%)  Comments: ...........test performed by...........Marland KitchenTerese Door, CMA      Vital Signs:  Patient profile:   55 year old female Height:      63 inches Weight:      181 pounds BMI:     32.18 Temp:     98.7  degrees F Pulse rate:   80 / minute BP sitting:   140 / 76  (right arm) Cuff size:   large  Vitals Entered By: Dennison Nancy RN (January 11, 2009 9:11 AM)

## 2010-08-02 NOTE — Miscellaneous (Signed)
Summary: 416 cbg yesterday  Clinical Lists Changes high cbg then 34 after insulin. Felt shaky. dtr told her she was confused. took lantus 22 units & novalog 5units& glucophage.felt her heart pounding. had a muffin,egg,bacon,banana. drank oj.  265 today & feels fine. she tests 4-5 times a day. sugars usually are in 200s. lasrt night had chicken,broccoli,pasta,ice cream . states it was sugar free ice cream. she has not been to a DM class or seen a nutritionist. told her I will ask md to refer her to one or to Dr. Gerilyn Pilgrim. she is going to have a sub & salad at lunch today. uses low fat mayo & dressing. advised her to keep a food diary from today until her appt next week. write how much,type of food & what time she ate it. bring this log to her appt with Dr. Raymondo Band.  I tried to explain that low fat dressing & ice cream have CHO & can run her sugars up.  told her to keep track of her sugars & call if she gets high or low numbers again. told her we have md on call when we are closed. to be ready to eat as soon as she takes the novalog. a long wait time will drop the cbgs too low. she agreed with plan. A relay interpretor was used for the call.Golden Circle RN  August 13, 2009 9:12 AM

## 2010-08-02 NOTE — Assessment & Plan Note (Signed)
Summary: f/up,tcb   Vital Signs:  Patient profile:   55 year old female Height:      63 inches Weight:      176.9 pounds BMI:     31.45 Temp:     97.5 degrees F oral Pulse rate:   86 / minute BP sitting:   138 / 82  (right arm) Cuff size:   regular  Vitals Entered By: Gladstone Pih (July 16, 2009 8:39 AM)  Serial Vital Signs/Assessments:  Time      Position  BP       Pulse  Resp  Temp     By                     125/72                         Asher Muir MD  CC: dm, htn, hld, rash, bloating, toenail Is Patient Diabetic? Yes Did you bring your meter with you today? No Pain Assessment Patient in pain? no        Primary Care Provider:  Asher Muir MD  CC:  dm, htn, hld, rash, bloating, and toenail.  History of Present Illness: 1.  DM--last visit, decreased lantus to 20 units and added meal covg b/c A1C increased, but low sugars at night.  Now, A1C 7.0.  Sugars are 200s in the morning.  but having lows in the afternoon--10 or 18; she is very symptomatic with this.  states she is eating a good lunch   but sometimes waits up to 1.5 hours after taking novolog before she eats.  this is when she has the lows.  does not have lows if she eats right after she takes the novolog.  no lows during the night since the lantus decreased  2.  HTN--initially above goal at 138/82.  but repeat 125/72.  on norvasc, metop, and lisinopril-hctz  3.  HLD--LDL 77 on simva 80.  due for ldl in feb  4.  rash--small spot on left, lat calf.  itches.  mild scale at times.  5.  bloating--still with abd bloating and sometimes diarrhea.  no longer taking reglan.  tried gas-ex and beano, which were ineffective.  certain foods make it worse.  6.  ingrown toenail--seen for that recently.  its better, no longer red or swollen.  she has been soaking it  Habits & Providers  Alcohol-Tobacco-Diet     Tobacco Status: never  Current Medications (verified): 1)  Amlodipine Besylate 10 Mg Tabs  (Amlodipine Besylate) .... Take 1 Tablet By Mouth Once A Day 2)  Bayer Childrens Aspirin 81 Mg Chew (Aspirin) .... Take 1 Tablet By Mouth Once A Day 3)  Glucophage 1000 Mg Tabs (Metformin Hcl) .Marland Kitchen.. 1 Tablet By Mouth Twice A Day 4)  Metoclopramide Hcl 5 Mg Tabs (Metoclopramide Hcl) .... Take 1 Tablet By Mouth As Directed 5)  Metoprolol Tartrate 100 Mg Tabs (Metoprolol Tartrate) .Marland Kitchen.. 1 By Mouth Two Times A Day 6)  Neurontin 300 Mg  Caps (Gabapentin) .Marland Kitchen.. 1 Pill 4x/day 7)  Lantus 100 Unit/ml Soln (Insulin Glargine) .... Inject 20 Units Daily.  Dispense Qs X 5month 8)  Lisinopril-Hydrochlorothiazide 10-12.5 Mg  Tabs (Lisinopril-Hydrochlorothiazide) .... One Tab By Mouth Daily 9)  Miralax   Powd (Polyethylene Glycol 3350) .Marland Kitchen.. 17g By Mouth Once Daily As Needed For Constipation.  Mix W/ 8 Oz of Water.  Disp 1 Large Container 10)  Fluticasone Propionate 50 Mcg/act  Susp (Fluticasone Propionate) .... 2 Sprays Each Nostril Once Daily 11)  Simvastatin 80 Mg Tabs (Simvastatin) .Marland Kitchen.. 1 By Mouth At Bedtime 12)  Ranitidine Hcl 150 Mg Caps (Ranitidine Hcl) .... Two Times A Day. 13)  Novolog 100 Unit/ml Soln (Insulin Aspart) .... 5 Units Subcutaneously Before Each Meal.  If Pre-Meal Blood Sugar Is <110, Do Not Take The Novolog At That Meal; Dispense Qs For 1 Month 14)  Bacitracin 500 Unit/gm Oint (Bacitracin) .... Apply To Affected Area Three Times A Day Until Symptoms Resolve Disp: Large Tube  Allergies: 1)  ! Sulfa 2)  ! * Altace 3)  Sulfamethoxazole (Sulfamethoxazole)  Physical Exam  General:  VS reviewed.  Mute.  Well appearing, NAD.  Translator present. Lungs:  Normal respiratory effort, chest expands symmetrically. Lungs are clear to auscultation, no crackles or wheezes. Heart:  Normal rate and regular rhythm. S1 and S2 normal without gallop, murmur, click, rub or other extra sounds. Abdomen:  Bowel sounds positive,abdomen soft and non-tender without masses, organomegaly or hernias noted. Pulses:  2+  dp pulses Extremities:  right 2nd toe.  medial nail, still slightly ingrown, but no pus or erythema.  no ttp Skin:  small hyperpigmented lesion (1.5 cm) with no scale and small amount of thickness (sub-q vs dermal).  no surrounding erythema.  no ttp Additional Exam:  vital signs reviewed    Impression & Recommendations:  Problem # 1:  SKIN RASH (ICD-782.1) Assessment New  examined with Dr. Leveda Anna.  not sure what this is, but doubt anything ominous.  KOH negative.  start triamcinololne ointment for 2 weeks.  consider punch biopsy if does not resolve.   Her updated medication list for this problem includes:    Triamcinolone Acetonide 0.1 % Oint (Triamcinolone acetonide) .Marland Kitchen... Apply to rash on leg two times a day for 2 weeks.  dispense one large tube  Orders: [facility] (41324)  Problem # 2:  INGROWN TOENAIL (ICD-703.0) Assessment: Improved continue to soak   Problem # 3:  HYPERTENSION, BENIGN SYSTEMIC (ICD-401.1) Assessment: Unchanged repeat bp at goal.  no changes Her updated medication list for this problem includes:    Amlodipine Besylate 10 Mg Tabs (Amlodipine besylate) .Marland Kitchen... Take 1 tablet by mouth once a day    Metoprolol Tartrate 100 Mg Tabs (Metoprolol tartrate) .Marland Kitchen... 1 by mouth two times a day    Lisinopril-hydrochlorothiazide 10-12.5 Mg Tabs (Lisinopril-hydrochlorothiazide) ..... One tab by mouth daily  Problem # 4:  DIABETES MELLITUS, II, COMPLICATIONS (ICD-250.92) Assessment: Improved great A1C.  morning sugars still high-->increase lantus to 22.  only lows now are in afternoon and seem to only occur when she takes novolog and waits to eat.  advised to take novolog right before meals.  asked her to send me a sugar log.    Her updated medication list for this problem includes:    Bayer Childrens Aspirin 81 Mg Chew (Aspirin) .Marland Kitchen... Take 1 tablet by mouth once a day    Glucophage 1000 Mg Tabs (Metformin hcl) .Marland Kitchen... 1 tablet by mouth twice a day    Lantus 100 Unit/ml Soln (Insulin  glargine) ..... Inject 20 units daily.  dispense qs x 14month    Lisinopril-hydrochlorothiazide 10-12.5 Mg Tabs (Lisinopril-hydrochlorothiazide) ..... One tab by mouth daily    Novolog 100 Unit/ml Soln (Insulin aspart) .Marland KitchenMarland KitchenMarland KitchenMarland Kitchen 5 units subcutaneously before each meal.  if pre-meal blood sugar is <110, do not take the novolog at that meal; dispense qs for 1 month  Orders: A1C-[facility] (40102)  Problem # 5:  DIARRHEA (ICD-787.91) Assessment: Unchanged moslty the problem is bloating now.  try lactobacillus  Problem # 6:  HYPERCHOLESTEROLEMIA (ICD-272.0) Assessment: Unchanged due for ldl check next visit Her updated medication list for this problem includes:    Simvastatin 80 Mg Tabs (Simvastatin) .Marland Kitchen... 1 by mouth at bedtime  Complete Medication List: 1)  Amlodipine Besylate 10 Mg Tabs (Amlodipine besylate) .... Take 1 tablet by mouth once a day 2)  Bayer Childrens Aspirin 81 Mg Chew (Aspirin) .... Take 1 tablet by mouth once a day 3)  Glucophage 1000 Mg Tabs (Metformin hcl) .Marland Kitchen.. 1 tablet by mouth twice a day 4)  Metoclopramide Hcl 5 Mg Tabs (Metoclopramide hcl) .... Take 1 tablet by mouth as directed 5)  Metoprolol Tartrate 100 Mg Tabs (Metoprolol tartrate) .Marland Kitchen.. 1 by mouth two times a day 6)  Neurontin 300 Mg Caps (Gabapentin) .Marland Kitchen.. 1 pill 4x/day 7)  Lantus 100 Unit/ml Soln (Insulin glargine) .... Inject 20 units daily.  dispense qs x 25month 8)  Lisinopril-hydrochlorothiazide 10-12.5 Mg Tabs (Lisinopril-hydrochlorothiazide) .... One tab by mouth daily 9)  Miralax Powd (Polyethylene glycol 3350) .Marland Kitchen.. 17g by mouth once daily as needed for constipation.  mix w/ 8 oz of water.  disp 1 large container 10)  Fluticasone Propionate 50 Mcg/act Susp (Fluticasone propionate) .... 2 sprays each nostril once daily 11)  Simvastatin 80 Mg Tabs (Simvastatin) .Marland Kitchen.. 1 by mouth at bedtime 12)  Ranitidine Hcl 150 Mg Caps (Ranitidine hcl) .... Two times a day. 13)  Novolog 100 Unit/ml Soln (Insulin aspart) .... 5 units  subcutaneously before each meal.  if pre-meal blood sugar is <110, do not take the novolog at that meal; dispense qs for 1 month 14)  Bacitracin 500 Unit/gm Oint (Bacitracin) .... Apply to affected area three times a day until symptoms resolve disp: large tube 15)  Triamcinolone Acetonide 0.1 % Oint (Triamcinolone acetonide) .... Apply to rash on leg two times a day for 2 weeks.  dispense one large tube  Other Orders: Flu Vaccine 29yrs + (32440) Admin 1st Vaccine (10272)  Patient Instructions: 1)  It was nice to see you today. 2)  For your rash on your leg, use the triamcinolone cream I prescribed you for 2 weeks.  You can continue to put lotion on it as well.  3)  For your cramps, try some icy hot. 4)  For your bloating, keep a diary of the foods you eat and how you feel afterwards.  Try some lactobacillus (over the counter) every day. 5)  For your diabetes, increase you lantus to 22 units in the morning.  Also, take your novolog RIGHT BEFORE YOU EAT. 6)  Call me if you have any low sugars (<60).   7)  Try to bring me or mail me a sugar log in the next few weeks. 8)  Please schedule a follow-up appointment in 3 months or sooner if your rash gets worse.  Prescriptions: GLUCOPHAGE 1000 MG TABS (METFORMIN HCL) 1 tablet by mouth twice a day  #60 Tablet x 6   Entered and Authorized by:   Asher Muir MD   Signed by:   Asher Muir MD on 07/16/2009   Method used:   Electronically to        [facility]  [address] [id number] (retail)       [address]       [place], [place]  [address]       Ph: [phone] or [phone]  Fax: [phone]   RxID:   [id number] LISINOPRIL-HYDROCHLOROTHIAZIDE 10-12.5 MG  TABS (LISINOPRIL-HYDROCHLOROTHIAZIDE) One tab by mouth daily  #30 Capsule x 6   Entered and Authorized by:   Asher Muir MD   Signed by:   Asher Muir MD on 07/16/2009   Method used:   Electronically to        [facility]  [address] [id number] (retail)        [address]       [place], [place]  [address]       Ph: [phone] or [phone]       Fax: [phone]   RxID:   [id number] TRIAMCINOLONE ACETONIDE 0.1 % OINT (TRIAMCINOLONE ACETONIDE) apply to rash on leg two times a day for 2 weeks.  dispense one large tube  #1 x 0   Entered and Authorized by:   Asher Muir MD   Signed by:   Asher Muir MD on 07/16/2009   Method used:   [facility]ically to        [facility]  [address] [id number] ([facility]       [address]       [place], [place]  [address]       Ph: [phone] or [phone]       Fax: [phone]   RxID:   [id number]   Laboratory Results   Blood Tests   Date/Time Received: July 16, 2009 8:45 AM  Date/Time Reported: July 16, 2009 9:06 AM   HGBA1C: 7.0%   (Normal Range: Non-Diabetic - 3-6%   Control Diabetic - 6-8%)  Comments: ...............test performed by......Marland KitchenBonnie A. [place], MLS (ASCP)cm  Date/Time Received: July 16, 2009 9:52 AM  Date/Time Reported: July 16, 2009 10:06 AM   Other Tests  Skin KOH: Negative Comments: ...............test performed by......Marland KitchenBonnie A. [place], MLS (ASCP)cm     Prevention & Chronic Care Immunizations   Influenza vaccine: Fluvax 3+  (07/16/2009)   Influenza vaccine due: 06/13/2008    Tetanus booster: 12/06/2003: Done.   Tetanus booster due: 12/05/2013    Pneumococcal vaccine: Done.  (04/03/2003)   Pneumococcal vaccine due: None  Colorectal Screening   Hemoccult: Not documented   Hemoccult due: Not Indicated    Colonoscopy: Results: Internal Hemorrhoids.     Location:  [facility].     (01/31/2008)   Colonoscopy due: 01/30/2018  Other Screening   Pap smear: NEGATIVE FOR INTRAEPITHELIAL LESIONS OR MALIGNANCY.  (10/16/2008)   Pap smear due: 12/10/2008    Mammogram: normal  (10/12/2008)   Mammogram action/deferral: Screening mammogram in 1 year.     (10/08/2006)   Mammogram due:  10/12/2009   Smoking status: never  (07/16/2009)  Diabetes Mellitus   HgbA1C: 7.0  (07/16/2009)   Hemoglobin A1C due: 03/12/2008    Eye exam: diabetic retinopathy  (01/01/2007)   Eye exam due: 01/01/2008    Foot exam: yes  (10/16/2008)   High risk foot: Not documented   Foot care education: Not documented   Foot exam due: 02/06/2009    Urine microalbumin/creatinine ratio: Not documented   Urine microalbumin/cr due: 10/15/2007    Diabetes flowsheet reviewed?: Yes   Progress toward A1C goal: At goal  Lipids   Total Cholesterol: 157  (08/20/2008)   LDL: 77  (08/20/2008)   LDL Direct: 96  (12/11/2007)   HDL: 69  (08/20/2008)   Triglycerides: 57  (08/20/2008)    SGOT (AST): 14  (04/22/2009)   SGPT (  ALT): 14  (04/22/2009)   Alkaline phosphatase: 93  (04/22/2009)   Total bilirubin: 0.3  (04/22/2009)    Lipid flowsheet reviewed?: Yes   Progress toward LDL goal: At goal  Hypertension   Last Blood Pressure: 138 / 82  (07/16/2009)   Serum creatinine: 0.55  (04/22/2009)   Serum potassium 3.6  (04/22/2009)    Hypertension flowsheet reviewed?: Yes   Progress toward BP goal: At goal   Hypertension comments: repeat bp at goal  Self-Management Support :   Personal Goals (by the next clinic visit) :     Personal A1C goal: 8  (04/22/2009)     Personal blood pressure goal: 130/80  (04/22/2009)     Personal LDL goal: 100  (04/22/2009)    Diabetes self-management support: Not documented    Diabetes self-management support not done because: Good outcomes  (04/22/2009)    Hypertension self-management support: Not documented    Hypertension self-management support not done because: Good outcomes  (04/22/2009)    Lipid self-management support: Not documented     Lipid self-management support not done because: Good outcomes  (04/22/2009)     Immunizations Administered:  Influenza Vaccine # 1:    Vaccine Type: Fluvax 3+    Site: right deltoid    Mfr: GlaxoSmithKline     Dose: 0.5 ml    Route: IM    Given by: Gladstone Pih    Exp. Date: 12/11/2009    Lot #: [id number]    VIS given: 01/24/07 version given July 16, 2009.    Physician counseled: yes  Flu Vaccine Consent Questions:    Do you have a history of severe allergic reactions to this vaccine? no    Any prior history of allergic reactions to egg and/or gelatin? no    Do you have a sensitivity to the preservative Thimersol? no    Do you have a past history of Guillan-Barre Syndrome? no    Do you currently have an acute febrile illness? no    Have you ever had a severe reaction to latex? no    Vaccine information given and explained to patient? yes    Are you currently pregnant? no   Appended Document: Orders Update    Clinical Lists Changes  Orders: Added new Test order of [facility]- Est  Level 4 (27253) - Signed

## 2010-08-02 NOTE — Assessment & Plan Note (Signed)
Summary: F/U Diabetes - Rx Clinic   Vital Signs:  Patient profile:   55 year old female Height:      63 inches Weight:      171 pounds BMI:     30.40 BP sitting:   139 / 82  (left arm)  Primary Care Provider:  Asher Muir MD   History of Present Illness: Presents to follow up on diabetes and cough accompanied by interpretor.    She reports feeling well today but has had 3-5 hypoglycemia episodes weekly.  She did not bring her meter today, but did bring a log.   Morning CBGs run around 90-110 with occasional CBGs above 200.  Lunchtime CBGs consistently low, range of 30-80.  Dinner/evening CBGs run around 100.    When asked about her cough she stated that it had improved since stopping the ACE inhibitor at the prior visit.   Patient had new complaint of rash under her breast.  She has been applying tramcinolone cream to the area.  Will ask Dr. Sandi Mealy to evaluate this.   Current Medications (verified): 1)  Amlodipine Besylate 10 Mg Tabs (Amlodipine Besylate) .... Take 1 Tablet By Mouth Once A Day 2)  Bayer Childrens Aspirin 81 Mg Chew (Aspirin) .... Take 1 Tablet By Mouth Once A Day 3)  Glucophage 1000 Mg Tabs (Metformin Hcl) .Marland Kitchen.. 1 Tablet By Mouth Twice A Day 4)  Metoprolol Tartrate 100 Mg Tabs (Metoprolol Tartrate) .Marland Kitchen.. 1 By Mouth Two Times A Day 5)  Neurontin 300 Mg  Caps (Gabapentin) .Marland Kitchen.. 1 Pill 4x/day 6)  Lantus 100 Unit/ml Soln (Insulin Glargine) .... Inject 10 Units in The Am and 10 Units in The Pm.   Prior To Breakfast and Prior To Evenng Meal.   Dispense Qs X 34month 7)  Miralax   Powd (Polyethylene Glycol 3350) .Marland Kitchen.. 17g By Mouth Once Daily As Needed For Constipation.  Mix W/ 8 Oz of Water.  Disp 1 Large Container 8)  Fluticasone Propionate 50 Mcg/act  Susp (Fluticasone Propionate) .... 2 Sprays Each Nostril Once Daily 9)  Simvastatin 80 Mg Tabs (Simvastatin) .Marland Kitchen.. 1 By Mouth At Bedtime 10)  Ranitidine Hcl 150 Mg Caps (Ranitidine Hcl) .... Two Times A Day. 11)  Novolog 100  Unit/ml Soln (Insulin Aspart) .... Sliding Scale - Zero For Bg<100, 3 Units For 100-150, 5 Units For 151-250, 8 Units For >250.  3 Times Daily Prior . Dispense 2 Vials Per Month. 12)  Triamcinolone Acetonide 0.1 % Oint (Triamcinolone Acetonide) .... Apply To Abdomen Once or Twice A Day.  Dispense One Large Tube 13)  Hydrochlorothiazide 25 Mg Tabs (Hydrochlorothiazide) .... 1/2 Tablets Daily 14)  Bd Insulin Syringe Ultrafine 30g X 1/2" 1 Ml Misc (Insulin Syringe-Needle U-100) .... Use For 5 Insulin Injections Daily.  Dispense One Month Supply.  Refill Prn 15)  Mupirocin 2 % Oint (Mupirocin) .... Apply To Affected Area - Under Right Breast. Twice Daily Then As Needed.  Dispense 30 Gram Tube. 16)  Losartan Potassium 50 Mg Tabs (Losartan Potassium) .... Once Daily in The Morning.  For Blood Pressure.  Allergies: 1)  ! Sulfa 2)  ! * Altace   Impression & Recommendations:  Problem # 1:  DIABETES MELLITUS, II, COMPLICATIONS (ICD-250.92) Assessment Improved  Diabetes currently under good control.  CBGs run mostly in the 100s, with a few about 200.  Lunchtime CBGs have been consistently lower than all others.  Reports  hypoglycemic events 3-4xweek, mostly at lunchtime but does have occasional events during the  night.  Able to verbalize appropriate hypoglycemia management plan.  Adjusted basal insulin Lantus to 9 units BID, down from 11 units BID.  Also decreased Novolog SSI to zero for bg<100, 3 units for 100-150, 5 units for 151-250, 8 units for >250.  There was concern at last visit about varying needle size and location of injection.  Patient was instructed to inject in the abdomen area and consistently use 1/2" needles.  This appears to have helped with fluctuation of CBGs.  Continue using 1/2" needles and checking CBGs throughout the day.   Written pt instructions provided: Plan for following with Dr. Lafonda Mosses in 3-4 weeks.  TTFFC: 30 mins.  Pt seen with:  Bethanne Ginger, Pharm.D., and  translator  Her updated medication list for this problem includes:    Bayer Childrens Aspirin 81 Mg Chew (Aspirin) .Marland Kitchen... Take 1 tablet by mouth once a day    Glucophage 1000 Mg Tabs (Metformin hcl) .Marland Kitchen... 1 tablet by mouth twice a day    Lantus 100 Unit/ml Soln (Insulin glargine) ..... Inject 9 units in the am and 9 units in the pm.   prior to breakfast and prior to evenng meal.   dispense qs x 66month    Novolog 100 Unit/ml Soln (Insulin aspart) ..... Sliding scale - zero for bg<100, 3 units for 100-150, 5 units for 151-250, 8 units for >250.  3 times daily prior . dispense 2 vials per month.    Losartan Potassium 50 Mg Tabs (Losartan potassium) ..... Once daily in the morning.  for blood pressure.  Orders: Reassessment Each 15 min unit- [facility] (16109)  Problem # 2:  HYPERTENSION, BENIGN SYSTEMIC (ICD-401.1) Assessment: Unchanged  Blood pressure on arrival to clinic was 139/83.  BP remains above goal of <130/80 and has been greater than goal for 3 visits.  ACE inhibitor was discontinued at last visit due to cough.  SInce stopping the ACE inhibitor the cough has improved.  We will initiate the ARB Losartan at 50mg  daily to help achieve goal BP.  Reevaluate BP at next visit. Consider combination product losartan/hctz to minimize pill burden.  If BP at goal consider reduction in Amlodipine dose as next step. Her updated medication list for this problem includes:    Amlodipine Besylate 10 Mg Tabs (Amlodipine besylate) .Marland Kitchen... Take 1 tablet by mouth once a day    Metoprolol Tartrate 100 Mg Tabs (Metoprolol tartrate) .Marland Kitchen... 1 by mouth two times a day    Hydrochlorothiazide 25 Mg Tabs (Hydrochlorothiazide) .Marland Kitchen... 1/2 tablets daily    Losartan Potassium 50 Mg Tabs (Losartan potassium) ..... Once daily in the morning.  for blood pressure.  Orders: Reassessment Each 15 min unit- [facility] [phone])  Problem # 3:  RASH AND OTHER NONSPECIFIC SKIN ERUPTION (ICD-782.1) Assessment: New  evaluated by Dr Sandi Mealy - breakdown  of skin under R breast.  no signs of excoriation.  no satellite lesions.  no surrounding erythema.  doesn't appear to be candidal at this time (and good control of sugars would indicate that this is somewhat less likely).  start trial of mupirocin, return if worsening.  Ancil Boozer  MD  October 12, 2009 9:57 AM  [phone][facility]  Problem # 4:  HYPERCHOLESTEROLEMIA (ICD-272.0) Assessment: Unchanged Previously at Cholesterol Goal for LDL.   Needs repeat cholesterol assessment at next lab draw.   Her updated medication list for this problem includes:    Simvastatin 80 Mg Tabs (Simvastatin) .Marland Kitchen... 1 by mouth at bedtime  Complete Medication List: 1)  Amlodipine Besylate 10 Mg Tabs (Amlodipine besylate) .... Take 1 tablet by mouth once a day 2)  Bayer Childrens Aspirin 81 Mg Chew (Aspirin) .... Take 1 tablet by mouth once a day 3)  Glucophage 1000 Mg Tabs (Metformin hcl) .Marland Kitchen.. 1 tablet by mouth twice a day 4)  Metoprolol Tartrate 100 Mg Tabs (Metoprolol tartrate) .Marland Kitchen.. 1 by mouth two times a day 5)  Neurontin 300 Mg Caps (Gabapentin) .Marland Kitchen.. 1 pill 4x/day 6)  Lantus 100 Unit/ml Soln (Insulin glargine) .... Inject 9 units in the am and 9 units in the pm.   prior to breakfast and prior to evenng meal.   dispense qs x 26month 7)  Miralax Powd (Polyethylene glycol 3350) .Marland Kitchen.. 17g by mouth once daily as needed for constipation.  mix w/ 8 oz of water.  disp 1 large container 8)  Fluticasone Propionate 50 Mcg/act Susp (Fluticasone propionate) .... 2 sprays each nostril once daily 9)  Simvastatin 80 Mg Tabs (Simvastatin) .Marland Kitchen.. 1 by mouth at bedtime 10)  Ranitidine Hcl 150 Mg Caps (Ranitidine hcl) .... Two times a day. 11)  Novolog 100 Unit/ml Soln (Insulin aspart) .... Sliding scale - zero for bg<100, 3 units for 100-150, 5 units for 151-250, 8 units for >250.  3 times daily prior . dispense 2 vials per month. 12)  Triamcinolone Acetonide 0.1 % Oint (Triamcinolone acetonide) ....  Apply to abdomen once or twice a day.  dispense one large tube 13)  Hydrochlorothiazide 25 Mg Tabs (Hydrochlorothiazide) .... 1/2 tablets daily 14)  Bd Insulin Syringe Ultrafine 30g X 1/2" 1 Ml Misc (Insulin syringe-needle u-100) .... Use for 5 insulin injections daily.  dispense one month supply.  refill prn 15)  Mupirocin 2 % Oint (Mupirocin) .... Apply to affected area - under right breast. three times daily then as needed.  dispense 30 gram tube. 16)  Losartan Potassium 50 Mg Tabs (Losartan potassium) .... Once daily in the morning.  for blood pressure.  Patient Instructions: 1)  Change Lantus to 9 units in the morning and 9 units in the evening.  2)  Change Novolog to 3 units for blood glucose for 100-150, Take 5 units for blood glucose 151-250,  Take 8 units for blood glucose higher than 250.   3)  Apply mupirocin three times daily under right breast for a week then as needed.  4)  Continue to monitor and record on your paper all your blood sugars.  5)  Follow Up next visit with Dr. Lafonda Mosses in 3-4 weeks.  Prescriptions: MUPIROCIN 2 % OINT (MUPIROCIN) Apply to affected area - under right breast. Three times daily then as needed.  Dispense 30 gram tube.  #1 x 0   Entered by:   Madelon Lips Pharm D   Authorized by:   Asher Muir MD   Signed by:   Madelon Lips Pharm D on 10/12/2009   Method used:   Electronically to        [facility]  [address] [id number] (retail)       [address]       [place], [place]  [address]       Ph: [phone] or [phone]       Fax: [phone]   RxID:   [id number] LANTUS 100 UNIT/ML SOLN (INSULIN GLARGINE) inject 9 units in the AM and 9 units in the PM.   Prior to Breakfast and Prior to [employer].   Dispense  QS x 34month  #1 x 0   Entered by:   Madelon Lips Pharm D   Authorized by:   Asher Muir MD   Signed by:   Madelon Lips Pharm D on 10/12/2009   Method used:   Historical   RxID:   [id number] LOSARTAN POTASSIUM 50  MG TABS (LOSARTAN POTASSIUM) once daily in the morning.  For Blood Pressure.  #30 x 5   Entered by:   Madelon Lips Pharm D   Authorized by:   Asher Muir MD   Signed by:   Madelon Lips Pharm D on 10/12/2009   Method used:   [facility] to        [facility]  [address] [id number]       [address]       [place], [place]  [address]       Ph: [phone] or [phone]       Fax: [phone]   RxID:   [id number] NOVOLOG 100 UNIT/ML SOLN (INSULIN ASPART) Sliding Scale - Zero for BG<100, 3 units for 100-150, 5 units for 151-250, 8 units for >250.  3 times daily prior . Dispense 2 vials per month.  #1 x 0   Entered and Authorized by:   Christian Mate D   Signed by:   Madelon Lips Pharm D on 10/12/2009   Method used:   Historical   RxID:   [id number] TRIAMCINOLONE ACETONIDE 0.1 % OINT (TRIAMCINOLONE ACETONIDE) apply to abdomen once or twice a day.  Dispense one large tube  #1 x 0   Entered and Authorized by:   Christian Mate D   Signed by:   Madelon Lips Pharm D on 10/12/2009   Method used:   Historical   RxID:   [id number]   Prevention & Chronic Care Immunizations   Influenza vaccine: Fluvax 3+  (07/16/2009)   Influenza vaccine due: 06/13/2008    Tetanus booster: 12/06/2003: Done.   Tetanus booster due: 12/05/2013    Pneumococcal vaccine: Done.  (04/03/2003)   Pneumococcal vaccine due: None  Colorectal Screening   Hemoccult: Not documented   Hemoccult due: Not Indicated    Colonoscopy: Results: Internal Hemorrhoids.     Location:  [facility].     (01/31/2008)   Colonoscopy due: 01/30/2018  Other Screening   Pap smear: NEGATIVE FOR INTRAEPITHELIAL LESIONS OR MALIGNANCY.  (10/16/2008)   Pap smear due: 12/10/2008    Mammogram: normal  (10/12/2008)   Mammogram action/deferral: Screening mammogram in 1 year.     (10/08/2006)   Mammogram due: 10/12/2009   Smoking status: never  (08/27/2009)  Diabetes Mellitus   HgbA1C:  7.0  (07/16/2009)   Hemoglobin A1C due: 03/12/2008    Eye exam: diabetic retinopathy  (01/01/2007)   Eye exam due: 01/01/2008    Foot exam: yes  (10/16/2008)   High risk foot: Not documented   Foot care education: Not documented   Foot exam due: 02/06/2009    Urine microalbumin/creatinine ratio: Not documented   Urine microalbumin/cr due: 10/15/2007    Diabetes flowsheet reviewed?: Yes   Progress toward A1C goal: At goal  Lipids   Total Cholesterol: 157  (08/20/2008)   LDL: 77  (08/20/2008)   LDL Direct: 96  (12/11/2007)   HDL: 69  (08/20/2008)   Triglycerides: 57  (08/20/2008)    SGOT (AST): 14  (04/22/2009)   SGPT (ALT): 14  (04/22/2009)   Alkaline phosphatase: 93  (04/22/2009)   Total  bilirubin: 0.3  (04/22/2009)    Lipid flowsheet reviewed?: Yes   Progress toward LDL goal: Unchanged  Hypertension   Last Blood Pressure: 139 / 82  (10/12/2009)   Serum creatinine: 0.55  (04/22/2009)   Serum potassium 3.6  (04/22/2009)    Hypertension flowsheet reviewed?: Yes   Progress toward BP goal: Unchanged    Stage of readiness to change (hypertension management): Action  Self-Management Support :   Personal Goals (by the next clinic visit) :     Personal A1C goal: 8  (04/22/2009)     Personal blood pressure goal: 130/80  (04/22/2009)     Personal LDL goal: 100  (04/22/2009)    Diabetes self-management support: CBG self-monitoring log, Written self-care plan  (10/12/2009)   Diabetes care plan printed    Diabetes self-management support not done because: Good outcomes  (04/22/2009)    Hypertension self-management support: Written self-care plan  (10/12/2009)   Hypertension self-care plan printed.    Hypertension self-management support not done because: Good outcomes  (08/20/2009)    Lipid self-management support: Not documented     Lipid self-management support not done because: Good outcomes  (10/12/2009)

## 2010-08-02 NOTE — Progress Notes (Signed)
Summary: Nuc. Pre-Procedure  Phone Note Outgoing Call Call back at [facility] Phone [phone]   Call placed by: Irean Hong, RN,  February 25, 2009 10:56 AM Summary of Call: Reviewed information on [employer] Information Sheet (see scanned document for further details).  Spoke with patient's daughter, and called to request interpreter(the patient is hearing repaired).     Nuclear Med Background Indications for Stress Test: Evaluation for Ischemia   History: Asthma   Symptoms: Chest Pain, Chest Tightness  Symptoms Comments: Bilateral Pedal edema   Nuclear Pre-Procedure Cardiac Risk Factors: Hypertension, IDDM Type 2, Lipids Height (in): 63

## 2010-08-02 NOTE — Assessment & Plan Note (Signed)
Summary: fu/wk   Vital Signs:  Patient Profile:   55 Years Old Female Weight:      183.8 pounds Temp:     98.0 degrees F Pulse rate:   66 / minute BP sitting:   146 / 78  (left arm)  Pt. in pain?   no  Vitals Entered By: Dedra Skeens CMA, (October 15, 2006 10:38 AM)                Chief Complaint:  F/U VISIT.  Diabetes Management History:      The patient is a 55 years old female who comes in for evaluation of DM Type 2.  She is (or has been) enrolled in the "Diabetic Education Program".  She states understanding of dietary principles but she is not following the appropriate diet.  No sensory loss is reported.  Self foot exams are being performed.  She is checking home blood sugars.  She says that she is not exercising regularly.        Reported hypoglycemic symptoms include sweats and nausea.  Frequency of hypoglycemic symptoms are reported to be seldom.  No hyperglycemic symptoms are reported.        Symptoms which suggest diabetic complications include paresthesias.  Since her last visit, no infections have occurred.  The following treatment plan problems are reported: diet problems.  No changes have been made to her treatment plan since last visit.    Hypertension History:      She denies headache, chest pain, palpitations, dyspnea with exertion, orthopnea, PND, peripheral edema, visual symptoms, neurologic problems, syncope, and side effects from treatment.  She notes no problems with any antihypertensive medication side effects.        Positive major cardiovascular risk factors include diabetes, hyperlipidemia, and hypertension.  Negative major cardiovascular risk factors include female age less than 96 years old.        Past Medical History:    3/06 Hb= 9.7, TSH=2.887, 5/06 [name]- 5.8cm ant body fibroid s/p embolization,     anisocoria since childhood,     Bilat Deafness- 55yo meningitis,     diabetic gastroparesis, diabetic retinopathy, has had ha assessments, including MRI-  unrevealing    Risk Factors:  Exercise:  no    Physical Exam  General:     Well-developed,well-nourished,in no acute distress; alert,appropriate and cooperative throughout examination Neck:     No deformities, masses, or tenderness noted. Lungs:     Normal respiratory effort, chest expands symmetrically. Lungs are clear to auscultation, no crackles or wheezes. Heart:     Normal rate and regular rhythm. S1 and S2 normal without gallop, murmur, click, rub or other extra sounds. Abdomen:     Bowel sounds positive,abdomen soft and non-tender without masses, organomegaly or hernias noted. Pulses:     R and L carotid,radial,femoral,dorsalis pedis and posterior tibial pulses are full and equal bilaterally Extremities:     No clubbing, cyanosis, edema, or deformity noted with normal full range of motion of all joints.   Skin:     Intact without suspicious lesions or rashes  Diabetes Management Exam:    Foot Exam (with socks and/or shoes not present):       Sensory-Pinprick/Light touch:          Left medial foot (L-4): normal          Left dorsal foot (L-5): normal          Left lateral foot (S-1): normal  Sensory-Monofilament:          Left foot: normal       Inspection:          Left foot: normal    Eye Exam:       Eye Exam done elsewhere          Date: 08/25/2005          Results: diabetic retinopathy          Done by: [facility]    Impression & Recommendations:  Problem # 1:  DIABETES MELLITUS, II, COMPLICATIONS (ICD-250.92) Assessment: Unchanged Will increase Metphormin to 1000mg  by mouth two times a day RTC in 2-3 weeks for CPE and DM check up. Encourage to adhere to diet to help controlling hyperglycemia and carry glucose tablets all the time in case hypoglycemia occurs. Orders: A1C-[facility] (60454) UA Microalbumin-[facility] (82044) [facility]- Est  Level 4 (99214)   Problem # 2:  NEUROPATHY, DIABETIC (ICD-250.60) Assessment: Unchanged Well controlled.  Orders: [facility]- Est  Level 4 (09811)   Problem # 3:  HYPERTENSION, BENIGN SYSTEMIC (ICD-401.1) Assessment: Improved Well controlled no therapy changes. Orders: [facility]- Est  Level 4 (91478)   Other Orders: Comp Met-[facility] ([id number])  Diabetes Management Assessment/Plan:      The following lipid goals have been established for the patient: Total cholesterol goal of 200; LDL cholesterol goal of 100; HDL cholesterol goal of 40; Triglyceride goal of 200.  Her blood pressure goal is < 130/80.    Hypertension Assessment/Plan:      The patient's hypertensive risk group is category C: Target organ damage and/or diabetes.  Today's blood pressure is 146/78.  Her blood pressure goal is < 130/80.   Patient Instructions: 1)  Please schedule a follow-up appointment in 2 -3 weeks for CPE. 2)  Check your feet each night for sore areas, calluses or signs of infection.  Laboratory Results   Urine Tests  Date/Time Recieved: October 15, 2006 10:38 AM   Date/Time Reported: October 15, 2006 12:15 PM  Microalbumin (urine): trace mg/L    Comments: ...................................................................DONNA [facility]  October 15, 2006 12:15 PM   Blood Tests   Date/Time Recieved: October 15, 2006 10:38 AM  Date/Time Reported: October 15, 2006 10:46 AM   HGBA1C: 7.3%   (Normal Range: Non-Diabetic - 3-6%   Control Diabetic - 6-8%)  Comments: ...................................................................DONNA [facility]  October 15, 2006 10:47 AM

## 2010-08-02 NOTE — Progress Notes (Signed)
Summary: triage  Phone Note Call from Patient Call back at [phone]   Caller: Patient Reason for Call: Talk to Nurse Summary of Call: pt called to say that her BS was running in the low 100's in the evening, but when she gets up, it's around 400-500.  needs to know what to do. Initial call taken by: De Nurse,  October 26, 2009 8:58 AM  Follow-up for Phone Call        message left to return call by video relay. Follow-up by: Theresia Lo RN,  October 26, 2009 9:30 AM  Additional Follow-up for Phone Call Additional follow up Details #1::        spoke with patient by video relay and she states since insulin was decreased two weeks ago BS have been running high in the mornings. in the evening BS readings are in the normal range. she currently is on Lantus 9 units twice daily. she does use Novolog per sliding scale. will forward to Dr. Raymondo Band and Dr. Lafonda Mosses. Additional Follow-up by: Theresia Lo RN,  October 26, 2009 9:45 AM    Additional Follow-up for Phone Call Additional follow up Details #2::    Let's have her go up to 10 units of Lantus in the evening.  Leave the sliding scale novolog and the morning lantus the same.  Please ask her to call us on Monday with an update of her sugars or sooner if they are running high or low.  Thanks Follow-up by: Asher Muir MD,  October 26, 2009 1:56 PM  Additional Follow-up for Phone Call Additional follow up Details #3:: Details for Additional Follow-up Action Taken: she was not answering the phone. I had the interpretor leave a message Additional Follow-up by: Golden Circle RN,  October 26, 2009 2:01 PM  New/Updated Medications: LANTUS 100 UNIT/ML SOLN (INSULIN GLARGINE) inject 9 units in the AM and 10 units in the PM.   Prior to Breakfast and Prior to Evenng Meal.   Dispense QS x 43month     the above message was also sent to Dr. Raymondo Band and he advises for patient to come in to see Dr. Lafonda Mosses this Friday. appointment has been set for  10/29/2009 at 9:20 AM . left message for patient to call us back to give this information. Dr. Raymondo Band states he will around clinic on Friday if Dr. Lafonda Mosses would like to consult with him. Theresia Lo RN  October 26, 2009 3:02 PM   called again and left message for patient to call back regarding increased insulin dose and appointment that has been scheduled on Friday. Theresia Lo RN  October 27, 2009 9:38 AM    message given to patient thru video relay. intrepretor will be scheduled also. Theresia Lo RN  October 27, 2009 9:50 AM

## 2010-08-02 NOTE — Assessment & Plan Note (Signed)
Summary: high cbgs/[place]/Kilyn Maragh   Vital Signs:  Patient profile:   55 year old female Height:      63 inches Weight:      168.1 pounds Temp:     98.3 degrees F Pulse rate:   81 / minute BP sitting:   136 / 76  Primary Care Provider:  Asher Muir MD  CC:  high sugars.  History of Present Illness: Pt with difficult-to-control sugars comes in for sda for:  1.  sugars--high last night about 6:30 pm before dinner--read "high" on meter.  became concerned.  called 911.  they came to house and checked cbg--initially in 500s.  repeat cbg in the 400s.  she declined to go to the ED.  her sugars have been difficult to control, as she has highs and lows.  she was recently seen in pharm clinic for this and her current regimen is lantus 22 units daily and 6 units of novolog with dinner.    she does not have her sugar log today, but tells me that since the most recent med changes, she is often high before dinner (high 100s - 400s).  She is also mostly high in hte mornings (high 100s to 400s).  however, she also had one low sugar (in the 40s I believe) one morning.    Allergies: 1)  ! Sulfa 2)  ! * Altace 3)  Sulfamethoxazole (Sulfamethoxazole)  Review of Systems General:  Denies fever. CV:  Denies chest pain or discomfort. Endo:  feels weak when sugars go high or low.  Physical Exam  General:  Well-developed,well-nourished,in no acute distress; alert,appropriate and cooperative throughout examination Psych:  Oriented X3, memory intact for recent and remote, normally interactive, and good eye contact.   Additional Exam:  vital signs reviewed    Impression & Recommendations:  Problem # 1:  DIABETES MELLITUS, II, COMPLICATIONS (ICD-250.92) Assessment Deteriorated  Her sugars are quite erratic and it is challenging to control the highs without causing her to go low.  Will institute simple sliding scale at dinner time for the novolog.  I am hesitant to add lunch time insulin because she  is often low prior to lunch She has pharmacy clinic follow up on Friday.  Reminded her to bring sugar log to that appointment.  Her updated medication list for this problem includes:    Bayer Childrens Aspirin 81 Mg Chew (Aspirin) .Marland Kitchen... Take 1 tablet by mouth once a day    Glucophage 1000 Mg Tabs (Metformin hcl) .Marland Kitchen... 1 tablet by mouth twice a day    Lantus 100 Unit/ml Soln (Insulin glargine) ..... Inject 22 units daily.  dispense qs x 88month    Lisinopril-hydrochlorothiazide 10-12.5 Mg Tabs (Lisinopril-hydrochlorothiazide) ..... One tab by mouth daily    Novolog 100 Unit/ml Soln (Insulin aspart) ..... Use the sliding scale i prescribed you  Orders: [facility]- Est Level  3 ([address])  Complete Medication List: 1)  Amlodipine Besylate 10 Mg Tabs (Amlodipine besylate) .... Take 1 tablet by mouth once a day 2)  Bayer Childrens Aspirin 81 Mg Chew (Aspirin) .... Take 1 tablet by mouth once a day 3)  Glucophage 1000 Mg Tabs (Metformin hcl) .Marland Kitchen.. 1 tablet by mouth twice a day 4)  Metoprolol Tartrate 100 Mg Tabs (Metoprolol tartrate) .Marland Kitchen.. 1 by mouth two times a day 5)  Neurontin 300 Mg Caps (Gabapentin) .Marland Kitchen.. 1 pill 4x/day 6)  Lantus 100 Unit/ml Soln (Insulin glargine) .... Inject 22 units daily.  dispense qs x 88month 7)  Lisinopril-hydrochlorothiazide 10-12.5  Mg Tabs (Lisinopril-hydrochlorothiazide) .... One tab by mouth daily 8)  Miralax Powd (Polyethylene glycol 3350) .Marland Kitchen.. 17g by mouth once daily as needed for constipation.  mix w/ 8 oz of water.  disp 1 large container 9)  Fluticasone Propionate 50 Mcg/act Susp (Fluticasone propionate) .... 2 sprays each nostril once daily 10)  Simvastatin 80 Mg Tabs (Simvastatin) .Marland Kitchen.. 1 by mouth at bedtime 11)  Ranitidine Hcl 150 Mg Caps (Ranitidine hcl) .... Two times a day. 12)  Novolog 100 Unit/ml Soln (Insulin aspart) .... Use the sliding scale i prescribed you 13)  Bacitracin 500 Unit/gm Oint (Bacitracin) .... Apply to affected area three times a day until symptoms  resolve disp: large tube 14)  Triamcinolone Acetonide 0.1 % Oint (Triamcinolone acetonide) .... Apply to rash on leg two times a day for 2 weeks.  dispense one large tube 15)  Tussicaps 10-8 Mg Xr12h-cap (Hydrocod polst-chlorphen polst) .Marland Kitchen.. 1 cap by mouth two times a day as needed cough.  dispense generic  Patient Instructions: 1)  It was nice to see you today. 2)  For your dinnertime novolog: 3)  no insulin if you are <100 4)  6 units if your sugar is 100-150 5)  8 units if your sugar is 151-250 6)  10 units if your sugar is >251 7)  Call our outside line if your sugar is over 450 8)  come to your pharmacy clinic appointment on Friday.

## 2010-08-02 NOTE — Assessment & Plan Note (Signed)
Summary: sore throat,df   Vital Signs:  Patient profile:   55 year old female Height:      63 inches Weight:      172.6 pounds BMI:     30.69 Temp:     98.4 degrees F oral Pulse rate:   76 / minute BP sitting:   107 / 70  (left arm) Cuff size:   regular  Vitals Entered By: Gladstone Pih (August 27, 2009 1:43 PM) CC: C/O sore throat X 5 days Is Patient Diabetic? Yes Did you bring your meter with you today? No Pain Assessment Patient in pain? no        Primary Care Provider:  Asher Muir MD  CC:  C/O sore throat X 5 days.  History of Present Illness: 1.  sore throat & cough--5 days.  up at night coughing.  green phlegm.  no fever, but chills.  works at a daycare.  nasal congestion, sneezing.   no n/v.  no SOB.  did have diarrhea one day, but it resolved.    Habits & Providers  Alcohol-Tobacco-Diet     Tobacco Status: never  Current Medications (verified): 1)  Amlodipine Besylate 10 Mg Tabs (Amlodipine Besylate) .... Take 1 Tablet By Mouth Once A Day 2)  Bayer Childrens Aspirin 81 Mg Chew (Aspirin) .... Take 1 Tablet By Mouth Once A Day 3)  Glucophage 1000 Mg Tabs (Metformin Hcl) .Marland Kitchen.. 1 Tablet By Mouth Twice A Day 4)  Metoprolol Tartrate 100 Mg Tabs (Metoprolol Tartrate) .Marland Kitchen.. 1 By Mouth Two Times A Day 5)  Neurontin 300 Mg  Caps (Gabapentin) .Marland Kitchen.. 1 Pill 4x/day 6)  Lantus 100 Unit/ml Soln (Insulin Glargine) .... Inject 22 Units Daily.  Dispense Qs X 79month 7)  Lisinopril-Hydrochlorothiazide 10-12.5 Mg  Tabs (Lisinopril-Hydrochlorothiazide) .... One Tab By Mouth Daily 8)  Miralax   Powd (Polyethylene Glycol 3350) .Marland Kitchen.. 17g By Mouth Once Daily As Needed For Constipation.  Mix W/ 8 Oz of Water.  Disp 1 Large Container 9)  Fluticasone Propionate 50 Mcg/act  Susp (Fluticasone Propionate) .... 2 Sprays Each Nostril Once Daily 10)  Simvastatin 80 Mg Tabs (Simvastatin) .Marland Kitchen.. 1 By Mouth At Bedtime 11)  Ranitidine Hcl 150 Mg Caps (Ranitidine Hcl) .... Two Times A Day. 12)   Novolog 100 Unit/ml Soln (Insulin Aspart) .... 6 Units Sq Before Dinner.  If Pre-Meal Blood Sugar Is <110, Do Not Take The Novolog At That Meal; Dispense Qs For 1 Month 13)  Bacitracin 500 Unit/gm Oint (Bacitracin) .... Apply To Affected Area Three Times A Day Until Symptoms Resolve Disp: Large Tube 14)  Triamcinolone Acetonide 0.1 % Oint (Triamcinolone Acetonide) .... Apply To Rash On Leg Two Times A Day For 2 Weeks.  Dispense One Large Tube  Allergies: 1)  ! Sulfa 2)  ! * Altace 3)  Sulfamethoxazole (Sulfamethoxazole)  Physical Exam  General:  Well-developed,well-nourished,in no acute distress; alert,appropriate and cooperative throughout examination Ears:  occluded by cerumen Mouth:  pharyngeal erythema.  mild.  no exudatepharyngeal erythema.   Neck:  No deformities, masses, or tenderness noted. Lungs:  Normal respiratory effort, chest expands symmetrically. Lungs are clear to auscultation, no crackles or wheezes. Heart:  Normal rate and regular rhythm. S1 and S2 normal without gallop, murmur, click, rub or other extra sounds. Additional Exam:  .vital signs reviewed    Impression & Recommendations:  Problem # 1:  UPPER RESPIRATORY INFECTION (ICD-465.9) Assessment New  rapid strep neg.  supportive care.  tussicaps for cough.  cloraseptic for sore  throat.   Her updated medication list for this problem includes:    Bayer Childrens Aspirin 81 Mg Chew (Aspirin) .Marland Kitchen... Take 1 tablet by mouth once a day    Tussicaps 10-8 Mg Xr12h-cap (Hydrocod polst-chlorphen polst) .Marland Kitchen... 1 cap by mouth two times a day as needed cough.  dispense generic  Orders: [facility]- Est Level  3 (11914)  Complete Medication List: 1)  Amlodipine Besylate 10 Mg Tabs (Amlodipine besylate) .... Take 1 tablet by mouth once a day 2)  Bayer Childrens Aspirin 81 Mg Chew (Aspirin) .... Take 1 tablet by mouth once a day 3)  Glucophage 1000 Mg Tabs (Metformin hcl) .Marland Kitchen.. 1 tablet by mouth twice a day 4)  Metoprolol Tartrate 100  Mg Tabs (Metoprolol tartrate) .Marland Kitchen.. 1 by mouth two times a day 5)  Neurontin 300 Mg Caps (Gabapentin) .Marland Kitchen.. 1 pill 4x/day 6)  Lantus 100 Unit/ml Soln (Insulin glargine) .... Inject 22 units daily.  dispense qs x 19month 7)  Lisinopril-hydrochlorothiazide 10-12.5 Mg Tabs (Lisinopril-hydrochlorothiazide) .... One tab by mouth daily 8)  Miralax Powd (Polyethylene glycol 3350) .Marland Kitchen.. 17g by mouth once daily as needed for constipation.  mix w/ 8 oz of water.  disp 1 large container 9)  Fluticasone Propionate 50 Mcg/act Susp (Fluticasone propionate) .... 2 sprays each nostril once daily 10)  Simvastatin 80 Mg Tabs (Simvastatin) .Marland Kitchen.. 1 by mouth at bedtime 11)  Ranitidine Hcl 150 Mg Caps (Ranitidine hcl) .... Two times a day. 12)  Novolog 100 Unit/ml Soln (Insulin aspart) .... 6 units sq before dinner.  if pre-meal blood sugar is <110, do not take the novolog at that meal; dispense qs for 1 month 13)  Bacitracin 500 Unit/gm Oint (Bacitracin) .... Apply to affected area three times a day until symptoms resolve disp: large tube 14)  Triamcinolone Acetonide 0.1 % Oint (Triamcinolone acetonide) .... Apply to rash on leg two times a day for 2 weeks.  dispense one large tube 15)  Tussicaps 10-8 Mg Xr12h-cap (Hydrocod polst-chlorphen polst) .Marland Kitchen.. 1 cap by mouth two times a day as needed cough.  dispense generic  Other Orders: Rapid Strep-[facility] (78295)  Patient Instructions: 1)  It was nice to see you today.  2)  I think you have a bad cold. 3)  For your cough, you can take the tussicaps I prescribed you.  They may make you sleepy. 4)  Drink plenty of fluids. 5)  You can use chloraseptic or cough drops to help with your sore throat.   6)  If you get congested, you can take sudafed or afrin nose spray.  If you choose afrin, only use it for 3 days; then stop. 7)  If your symptoms get worse, or if your sore throat does not go away in 2 more days, come back to the clinic.   Prescriptions: TUSSICAPS 10-8 MG XR12H-CAP  (HYDROCOD POLST-CHLORPHEN POLST) 1 cap by mouth two times a day as needed cough.  dispense generic  #30 x 0   Entered and Authorized by:   Asher Muir MD   Signed by:   Asher Muir MD on 08/27/2009   Method used:   Print then Give to Patient   RxID:   [id number]   Laboratory Results  Date/Time Received: August 27, 2009 1:49 PM  Date/Time Reported: August 27, 2009 2:06 PM   Other Tests  Rapid Strep: negative Comments: ...........test performed by...........Marland KitchenTerese Door, CMA

## 2010-08-02 NOTE — Progress Notes (Signed)
Summary: blood sugar  Phone Note Call from Patient   Summary of Call: Call from pt via video interpreter.  Pt states she had steriod injection at [facility] yesterday- was told to watch blood sugar as it may go up.  States this morning blood sugar was 300, now it is 450 after eating a light lunch per pt.  Denies dizziness, nausea or other symptoms associated with hyperglycemia.  Spoke with Dr. Lafonda Mosses - she advised take 5 units of lantus now, drink lots of water.  Call back if does not go down.  Pt agreeable. Initial call taken by: AMY MARTIN RN,  May 07, 2008 2:13 PM

## 2010-08-02 NOTE — Miscellaneous (Signed)
Summary: diabetic shoes other supplies  Clinical Lists Changes  eceived request from [facility] for diabetic shoes and for Aqua Therapy System.  Signed request for shoes, but do need more information before approving Aqua Therapy System.  Form is in my box and has multiple questions to answer.  Called pt and left mssg with family member for her to call back.   Medications: Added new medication of * DIABETIC SHOES  Appended Document: diabetic shoes other supplies Treadmill time changed to 8/17 at 11:15. pt notified

## 2010-08-02 NOTE — Letter (Signed)
Summary: [facility] Lipid Letter  [facility]  [address]   [place], [place] [address]   Phone: [phone]  Fax: [phone]    08/21/2008 MRN: [id number]  Alicia [name] [address] [place], [place]  [address]  Dear Alicia [name]:  Your cholesterol levels are VERY GOOD.  Here are the numbers.     Cholesterol:       157     Goal: <200   HDL "good" Cholesterol:   69     Goal: >40   LDL "bad" Cholesterol:   77               Goal: <100; closer to 70 is better   Triglycerides:       57     Goal: <150  Congratulations and keep up the good work!  Call me if you have any questions.   Sincerely,   Asher Muir MD [facility] Asher Muir MD

## 2010-08-02 NOTE — Assessment & Plan Note (Signed)
Summary: DM/HTN,df   Vital Signs:  Patient profile:   55 year old female Height:      63 inches Weight:      162 pounds BMI:     28.80 Pulse rate:   82 / minute BP sitting:   146 / 85  (left arm) Cuff size:   regular  Vitals Entered By: Tessie Fass CMA (May 13, 2010 1:37 PM) CC: F/U meds Is Patient Diabetic? Yes   Primary Provider:  Lloyd Huger MD  CC:  F/U meds.  History of Present Illness: 1. DM: She has been taking 10 units lantus qam and sliding scale as planned at her most recent Pharm clinic appt with Dr. Raymondo Band. She has not found much difficulty with this regimen.  FSBS values: fasting 78-157 and at bedtime 88-147. No recent episodes of hypoglycemia. She has lost 10 pounds since last visit approximately 1 month ago. States that her neuropathic symptoms have improved with the weight loss.   2. HTN: Does not have home BP cuff. Takes it at pharmacy occaisionally. Values typically range from 120-140 systolic/70s diastolic. Had decreased metoprolol to 50mg  BID to avoid masking hypoglycemic episodes. Now taking 100mg  two times a day. Taking losartan and norvasc. Was taken off of ACEi-HCTZ combo drug due to chronic cough earlier this year.   3. Right leg soreness: Patient has been exercising nearly daily including workout videos, walking, and even hula-hooping. She has daily muscle soreness. Specifically has noticed some right posterior leg pain that starts at upper buttock and radiates down to just above her knee. Denies back pain, numbness, tingling, bowel/bladder incontinence.   4. Weight loss: now exercising daily, lost 10 lbs. Very motivated to improve her health.   Allergies: 1)  ! Sulfa 2)  ! * Altace  Past History:  Past Medical History: Last updated: 12/28/2009  5/06 [name]- 5.8cm ant body uterine fibroid s/p embolization,  anisocoria since childhood,  [place] Deafness- 55yo meningitis,  diabetic gastroparesis, diabetic retinopathy, has had ha assessments, including  MRI- unrevealing HTN diabetes with fluctuating sugars HLD  Past Surgical History: Last updated: 10/16/2008 12/06 Uterine fibroids embolization - 10/12/2005 2-D Echo - nl 5/04 due to murmur - 11/20/2002 Appendectomy  cardiolite-EF77%no ischemia - 04/06/2005 cesarian section x2  ETT -intermediate Risk significant CAD-McDiarmid - 02/27/2005 Tubal ligation   Family History: Last updated: 10/16/2008 HTN mother +ovarian CA grandmother mother and sister have uterine fibroids no CAD/MI/DM, sudden death, no hypercholesterolemia aunt has "bone cancer"  Social History: Last updated: 10/16/2008 married lives w/ husband and three children; Husband is deaf also, children are not; Works in day care since 2000; No tobacco,occ  EtOH, rec drugs  Risk Factors: Smoking Status: never (02/21/2010)  Review of Systems  The patient denies anorexia, fever, weight gain, chest pain, syncope, dyspnea on exertion, peripheral edema, prolonged cough, and headaches.    Physical Exam  General:  alert, well-developed, well-nourished, and well-hydrated.   Head:  normocephalic and atraumatic.   Eyes:  vision grossly intact.  PERRLA, EOMI Mouth:  Oral mucosa and oropharynx without lesions or exudates.  Teeth in good repair. Lungs:  Normal respiratory effort, chest expands symmetrically. Lungs are clear to auscultation, no crackles or wheezes. Heart:  Normal rate and regular rhythm. S1 and S2 normal without gallop, murmur, click, rub or other extra sounds. Msk:  Negative straight leg testing. TTP over right SI joint.  Right Faber testing positive. Pulses:  2+ DP pulses bilaterally.  Extremities:  No clubbing, cyanosis, edema, or deformity  noted with normal full range of motion of all joints.   Neurologic:  alert & oriented X3, cranial nerves II-XII intact, strength normal in all extremities, and sensation intact to light touch.    Diabetes Management Exam:    Foot Exam (with socks and/or shoes not present):        Sensory-Pinprick/Light touch:          Left medial foot (L-4): normal          Left dorsal foot (L-5): normal          Left lateral foot (S-1): normal          Right medial foot (L-4): normal          Right dorsal foot (L-5): normal          Right lateral foot (S-1): normal       Sensory-Monofilament:          Left foot: normal          Right foot: normal       Inspection:          Left foot: normal          Right foot: normal       Nails:          Left foot: normal          Right foot: normal    Eye Exam:       Eye Exam not due    Impression & Recommendations:  Problem # 1:  DIABETES MELLITUS, II, COMPLICATIONS (ICD-250.92) A1C is 7.3 today, at goal. On new insulin dosing  has not had any further hypoglycemic episodes. Patient states that she is taking 10 units lantus daily; however, last recommendations per Dr. Raymondo Band were to decrease to 8 units to avoid future hypoglycemia. Clarified plan with the patient. Will continue this regimen for now. May need to further decrease dose if she continues to have success in weight loss.  Will f/u in 3 months.  Has Opthalmology f/u scheduled q 6 months.   Her updated medication list for this problem includes:    Bayer Childrens Aspirin 81 Mg Chew (Aspirin) .Marland Kitchen... Take 1 tablet by mouth once a day    Glucophage 1000 Mg Tabs (Metformin hcl) .Marland Kitchen... 1 tablet by mouth twice a day    Lantus 100 Unit/ml Soln (Insulin glargine) ..... Inject 8  units in the am prior to breakfastl.   dispense qs x 13month    Novolog 100 Unit/ml Soln (Insulin aspart) ..... Inject 2 units prior to each meal.  if pre-meal blood glucose is 150-250 give 3 units total and if blood glucose is >250 give 4 units total.    Losartan Potassium 100 Mg Tabs (Losartan potassium) ..... One daily  Orders: A1C-[facility] (16109) [facility]- Est  Level 4 (60454)  Problem # 2:  HYPERTENSION, BENIGN SYSTEMIC (ICD-401.1) Will plan to restart HCTZ 12.5mg  daily as patient has consistently been somewhat  above goal of <130/80 since she stopped her lisinopril-hctz combo therapy due to chronic cough. Asked patient to record her BP values daily if she can access a cuff or visit the pharmacy more often, however transportation is an issue.  Cr and electrolytes wnl.   Her updated medication list for this problem includes:    Amlodipine Besylate 10 Mg Tabs (Amlodipine besylate) .Marland Kitchen... Take 1 tablet by mouth once a day    Losartan Potassium 100 Mg Tabs (Losartan potassium) ..... One daily    Metoprolol Tartrate 100  Mg Tabs (Metoprolol tartrate) .Marland Kitchen... Take one tablet twice daily    Hydrochlorothiazide 25 Mg Tabs (Hydrochlorothiazide) .Marland Kitchen... Take one half tablet po daily.  Orders: Comp Met-[facility] (16109-60454) [facility]- Est  Level 4 (09811)  Problem # 3:  OBESITY (ICD-278.00) Assessment: Improved Has acheived weight loss results through regular exercise. Encouraged patient to continue healthy lifestyle choices to maintain her weight loss which will benefit many of her chronic health issues. BMI is 28.8 today.  Orders: [facility]- Est  Level 4 (99214)  Problem # 4:  HYPERCHOLESTEROLEMIA (ICD-272.0) Continue current managment and regular exercise.  Her updated medication list for this problem includes:    Lipitor 40 Mg Tabs (Atorvastatin calcium) .Marland Kitchen... 1 tab by mouth at bedtime for cholesterol  Problem # 5:  LEG PAIN, RIGHT (ICD-729.5) Assessment: New This new pain seems likely secondary to inflammation of right SI joint.  Recommend she use motrin or aleve and heating pad to relieve inflammation. Will treat conservatively unless pain worsens or she fails to respond.   Problem # 6:  NEUROPATHY, DIABETIC (ICD-250.60) Symptoms have improved with weight loss. Will continue current management.  Her updated medication list for this problem includes:    Bayer Childrens Aspirin 81 Mg Chew (Aspirin) .Marland Kitchen... Take 1 tablet by mouth once a day    Glucophage 1000 Mg Tabs (Metformin hcl) .Marland Kitchen... 1 tablet by mouth twice a day     Lantus 100 Unit/ml Soln (Insulin glargine) ..... Inject 8  units in the am prior to breakfastl.   dispense qs x 30month    Novolog 100 Unit/ml Soln (Insulin aspart) ..... Inject 2 units prior to each meal.  if pre-meal blood glucose is 150-250 give 3 units total and if blood glucose is >250 give 4 units total.    Losartan Potassium 100 Mg Tabs (Losartan potassium) ..... One daily  Complete Medication List: 1)  Amlodipine Besylate 10 Mg Tabs (Amlodipine besylate) .... Take 1 tablet by mouth once a day 2)  Bayer Childrens Aspirin 81 Mg Chew (Aspirin) .... Take 1 tablet by mouth once a day 3)  Glucophage 1000 Mg Tabs (Metformin hcl) .Marland Kitchen.. 1 tablet by mouth twice a day 4)  Neurontin 300 Mg Caps (Gabapentin) .Marland Kitchen.. 1 pill 4x/day 5)  Lantus 100 Unit/ml Soln (Insulin glargine) .... Inject 8  units in the am prior to breakfastl.   dispense qs x 30month 6)  Miralax Powd (Polyethylene glycol 3350) .Marland Kitchen.. 17g by mouth three times per week as needed for constipation.  mix w/ 8 oz of water.  disp 1 large container 7)  Fluticasone Propionate 50 Mcg/act Susp (Fluticasone propionate) .... 2 sprays each nostril once daily 8)  Lipitor 40 Mg Tabs (Atorvastatin calcium) .Marland Kitchen.. 1 tab by mouth at bedtime for cholesterol 9)  Bd Insulin Syringe Ultrafine 30g X 1/2" 1 Ml Misc (Insulin syringe-needle u-100) .... Use for 5 insulin injections daily.  dispense one month supply.  refill prn 10)  Novolog 100 Unit/ml Soln (Insulin aspart) .... Inject 2 units prior to each meal.  if pre-meal blood glucose is 150-250 give 3 units total and if blood glucose is >250 give 4 units total. 11)  Losartan Potassium 100 Mg Tabs (Losartan potassium) .... One daily 12)  Metoprolol Tartrate 100 Mg Tabs (Metoprolol tartrate) .... Take one tablet twice daily 13)  Hydrochlorothiazide 25 Mg Tabs (Hydrochlorothiazide) .... Take one half tablet po daily.  Other Orders: Influenza Vaccine NON MCR (91478)  Patient Instructions: 1)  Nice to see you  again. 2)  Excellent job on weight loss! 3)  You may use aleve, motrin, or tylenol for your leg pain. 4)  Please start writing down your blood pressure measurements at home. 5)  Scheule a follow up appointment in 3 months. Prescriptions: HYDROCHLOROTHIAZIDE 25 MG TABS (HYDROCHLOROTHIAZIDE) Take one half tablet po daily.  #30 x 3   Entered and Authorized by:   Lloyd Huger MD   Signed by:   Lloyd Huger MD on 05/15/2010   Method used:   Electronically to        [facility]  [address] [id number] ([facility]ail)       [address]       [place], [place]  [address]       Ph: [phone] or [phone]       Fax: [phone]   RxID:   [id number] AMLODIPINE BESYLATE 10 MG TABS (AMLODIPINE BESYLATE) Take 1 tablet by mouth once a day  #30 x 5   Entered and Authorized by:   Lloyd Huger MD   Signed by:   Lloyd Huger MD on 05/13/2010   Method used:   [facility]ically to        [facility]  [address] [id number] ([facility]       [address]       [place], [place]  [address]       Ph: [phone] or [phone]       Fax: [phone]   RxID:   [id number]    Orders Added: 1)  A1C-[facility] [83036] 2)  Comp Met-[facility] [74081-44818] 3)  Influenza Vaccine NON MCR [00028] 4)  [facility]- Est  Level 4 [56314]   Immunizations Administered:  Influenza Vaccine # 1:    Vaccine Type: Fluvax Non-MCR    Site: right deltoid    Mfr: GlaxoSmithKline    Dose: 0.5 ml    Route: IM    Given by: Arlyss Repress CMA,    Exp. Date: 12/31/2010    Lot #: [id number]    VIS given: 01/25/10 version given May 13, 2010.  Flu Vaccine Consent Questions:    Do you have a history of severe allergic reactions to this vaccine? no    Any prior history of allergic reactions to egg and/or gelatin? no    Do you have a sensitivity to the preservative Thimersol? no    Do you have a past history of Guillan-Barre Syndrome? no    Do you currently have an acute febrile illness? no    Have you ever had a severe  reaction to latex? no    Vaccine information given and explained to patient? yes    Are you currently pregnant? no   Immunizations Administered:  Influenza Vaccine # 1:    Vaccine Type: Fluvax Non-MCR    Site: right deltoid    Mfr: GlaxoSmithKline    Dose: 0.5 ml    Route: IM    Given by: Arlyss Repress CMA,    Exp. Date: 12/31/2010    Lot #: [id number]    VIS given: 01/25/10 version given May 13, 2010.   Laboratory Results   Blood Tests   Date/Time Received: May 13, 2010 2:08 PM  Date/Time Reported: May 13, 2010 2:35 PM   HGBA1C: 7.3%   (Normal Range: Non-Diabetic - 3-6%   Control Diabetic - 6-8%)  Comments: ...............test performed by......Marland KitchenBonnie A. [place], MLS (ASCP)cm      Prevention & Chronic Care Immunizations   Influenza  vaccine: Fluvax Non-MCR  (05/13/2010)   Influenza vaccine due: 06/13/2008    Tetanus booster: 12/06/2003: Done.   Tetanus booster due: 12/05/2013    Pneumococcal vaccine: Done.  (04/03/2003)   Pneumococcal vaccine due: None  Colorectal Screening   Hemoccult: Not documented   Hemoccult due: Not Indicated    Colonoscopy: Results: Internal Hemorrhoids.     Location:  [facility].     (01/31/2008)   Colonoscopy action/deferral: Deferred  (02/21/2010)   Colonoscopy due: 01/30/2018  Other Screening   Pap smear: NEGATIVE FOR INTRAEPITHELIAL LESIONS OR MALIGNANCY.  (10/16/2008)   Pap smear action/deferral: Deferred-3 yr interval  (02/21/2010)   Pap smear due: 10/17/2011    Mammogram: No specific mammographic evidence of malignancy.    (10/12/2009)   Mammogram action/deferral: Deferred  (02/21/2010)   Mammogram due: 10/2010   Smoking status: never  (02/21/2010)  Diabetes Mellitus   HgbA1C: 7.3  (05/13/2010)   Hemoglobin A1C due: 03/12/2008    Eye exam: diabetic retinopathy  (02/07/2010)   Eye exam due: 02/08/2011    Foot exam: yes  (05/13/2010)   High risk foot: Not documented   Foot care education:  Not documented   Foot exam due: 02/22/2011    Urine microalbumin/creatinine ratio: Not documented   Urine microalbumin action/deferral: Not indicated   Urine microalbumin/cr due: 10/15/2007    Diabetes flowsheet reviewed?: Yes   Progress toward A1C goal: At goal  Lipids   Total Cholesterol: 157  (08/20/2008)   Lipid panel action/deferral: LDL Direct ordered   LDL: 77  (08/20/2008)   LDL Direct: 75  (02/21/2010)   HDL: 69  (08/20/2008)   Triglycerides: 57  (08/20/2008)   Lipid panel due: 03/24/2010    SGOT (AST): 14  (04/22/2009)   SGPT (ALT): 14  (04/22/2009) CMP ordered    Alkaline phosphatase: 93  (04/22/2009)   Total bilirubin: 0.3  (04/22/2009)    Lipid flowsheet reviewed?: Yes   Progress toward LDL goal: At goal  Hypertension   Last Blood Pressure: 146 / 85  (05/13/2010)   Serum creatinine: 0.55  (04/22/2009)   Serum potassium 3.6  (04/22/2009) CMP ordered    Basic metabolic panel due: 03/24/2010    Hypertension flowsheet reviewed?: Yes   Progress toward BP goal: Deteriorated  Self-Management Support :   Personal Goals (by the next clinic visit) :     Personal A1C goal: 8  (04/22/2009)     Personal blood pressure goal: 130/80  (04/22/2009)     Personal LDL goal: 100  (04/22/2009)    Diabetes self-management support: CBG self-monitoring log, Written self-care plan  (10/12/2009)    Diabetes self-management support not done because: Good outcomes  (04/22/2009)    Hypertension self-management support: Written self-care plan  (04/15/2010)    Hypertension self-management support not done because: Good outcomes  (10/29/2009)    Lipid self-management support: Not documented     Lipid self-management support not done because: Good outcomes  (10/29/2009)

## 2010-08-02 NOTE — Miscellaneous (Signed)
Summary: patient summary  Clinical Lists Changes  Pt is very nice lady.  she is deaf, and she almost always comes with interpretor.  Her visits take a little longer because of this.   Problems: Removed problem of RASH AND OTHER NONSPECIFIC SKIN ERUPTION (ICD-782.1) Removed problem of CHEST PAIN, ATYPICAL (ICD-786.59) Removed problem of CARPAL TUNNEL SYNDROME (ICD-354.0) Assessed DIABETES MELLITUS, II, COMPLICATIONS as comment only -  difficult to get tight control because she has low sugars.  we currently have on lantus, plus ssi novolog at meal time.  I think <8 is a good goal for her.  she has been to pharm clinic for this several times.   Her updated medication list for this problem includes:    Bayer Childrens Aspirin 81 Mg Chew (Aspirin) .Marland Kitchen... Take 1 tablet by mouth once a day    Glucophage 1000 Mg Tabs (Metformin hcl) .Marland Kitchen... 1 tablet by mouth twice a day    Lantus 100 Unit/ml Soln (Insulin glargine) ..... Inject 9 units in the am and 10 units in the pm.   prior to breakfast and prior to evenng meal.   dispense qs x 31month    Novolog 100 Unit/ml Soln (Insulin aspart) ..... Sliding scale - zero for bg<100, 4 units for 100-150, 6 units for 151-250, 8 units for >250.  3 times daily prior . dispense 2 vials per month.    Losartan Potassium 50 Mg Tabs (Losartan potassium) ..... Once daily in the morning.  for blood pressure.  Labs Reviewed: Creat: 0.55 (04/22/2009)   Microalbumin: trace (08/20/2008)  Last Eye Exam: diabetic retinopathy (01/01/2007) Reviewed HgBA1c results: 7.1 (10/29/2009)  7.0 (07/16/2009)  Assessed HYPERCHOLESTEROLEMIA as comment only - good control.  I just switched her to lipitor because her pharmacy was apparently advising her against taking 80mg  dose of simva.  she may need to have this explained to her Her updated medication list for this problem includes:    Lipitor 40 Mg Tabs (Atorvastatin calcium) .Marland Kitchen... 1 tab by mouth at bedtime for cholesterol  Labs  Reviewed: SGOT: 14 (04/22/2009)   SGPT: 14 (04/22/2009)  Prior 10 Yr Risk Heart Disease: Not enough information (10/15/2006)   HDL:69 (08/20/2008), 72 (03/11/2007)  LDL:77 (08/20/2008), 102 (16/04/9603)  Chol:157 (08/20/2008), 184 (03/11/2007)  Trig:57 (08/20/2008), 51 (03/11/2007)  Assessed HYPERTENSION, BENIGN SYSTEMIC as comment only - may need increase in meds next visit, has been high most recent visit Her updated medication list for this problem includes:    Amlodipine Besylate 10 Mg Tabs (Amlodipine besylate) .Marland Kitchen... Take 1 tablet by mouth once a day    Metoprolol Tartrate 100 Mg Tabs (Metoprolol tartrate) .Marland Kitchen... 1 by mouth two times a day    Hydrochlorothiazide 25 Mg Tabs (Hydrochlorothiazide) .Marland Kitchen... 1/2 tablets daily    Losartan Potassium 50 Mg Tabs (Losartan potassium) ..... Once daily in the morning.  for blood pressure.  Prior BP: 149/77 (11/30/2009)  Prior 10 Yr Risk Heart Disease: Not enough information (10/15/2006)  Labs Reviewed: K+: 3.6 (04/22/2009) Creat: : 0.55 (04/22/2009)   Chol: 157 (08/20/2008)   HDL: 69 (08/20/2008)   LDL: 77 (08/20/2008)   TG: 57 (08/20/2008)  Assessed GERD as comment only -  Her updated medication list for this problem includes:    Ranitidine Hcl 150 Mg Caps (Ranitidine hcl) .Marland Kitchen..Marland Kitchen Two times a day.  Assessed RHINITIS, ALLERGIC as comment only -  Her updated medication list for this problem includes:    Fluticasone Propionate 50 Mcg/act Susp (Fluticasone propionate) .Marland Kitchen... 2 sprays each nostril  once daily  Assessed NEUROPATHY, DIABETIC as comment only - not severe.  not on gabapentin Her updated medication list for this problem includes:    Bayer Childrens Aspirin 81 Mg Chew (Aspirin) .Marland Kitchen... Take 1 tablet by mouth once a day    Glucophage 1000 Mg Tabs (Metformin hcl) .Marland Kitchen... 1 tablet by mouth twice a day    Lantus 100 Unit/ml Soln (Insulin glargine) ..... Inject 9 units in the am and 10 units in the pm.   prior to breakfast and prior to evenng meal.    dispense qs x 18month    Novolog 100 Unit/ml Soln (Insulin aspart) ..... Sliding scale - zero for bg<100, 4 units for 100-150, 6 units for 151-250, 8 units for >250.  3 times daily prior . dispense 2 vials per month.    Losartan Potassium 50 Mg Tabs (Losartan potassium) ..... Once daily in the morning.  for blood pressure.  Assessed HEARING LOSS NOS OR DEAFNESS as comment only - always comes with interpretor Assessed ASTHMA, UNSPECIFIED as comment only -  we have never really discussed this and she is on no meds for it  Observations: Added new observation of SH REVIEWED: reviewed - no changes required (12/28/2009 19:05) Added new observation of FH REVIEWED: reviewed - no changes required (12/28/2009 19:05) Added new observation of PSH REVIEWED: reviewed - no changes required (12/28/2009 19:05) Added new observation of PAST MED HX:  5/06 [name]- 5.8cm ant body uterine fibroid s/p embolization,  anisocoria since childhood,  Bilat Deafness- 55yo meningitis,  diabetic gastroparesis, diabetic retinopathy, has had ha assessments, including MRI- unrevealing HTN diabetes with fluctuating sugars HLD   (12/28/2009 19:05)      Impression & Recommendations:  Problem # 1:  DIABETES MELLITUS, II, COMPLICATIONS (ICD-250.92) Assessment Comment Only  difficult to get tight control because she has low sugars.  we currently have on lantus, plus ssi novolog at meal time.  I think <8 is a good goal for her.  she has been to pharm clinic for this several times.   Her updated medication list for this problem includes:    Bayer Childrens Aspirin 81 Mg Chew (Aspirin) .Marland Kitchen... Take 1 tablet by mouth once a day    Glucophage 1000 Mg Tabs (Metformin hcl) .Marland Kitchen... 1 tablet by mouth twice a day    Lantus 100 Unit/ml Soln (Insulin glargine) ..... Inject 9 units in the am and 10 units in the pm.   prior to breakfast and prior to evenng meal.   dispense qs x 18month    Novolog 100 Unit/ml Soln (Insulin aspart) ..... Sliding  scale - zero for bg<100, 4 units for 100-150, 6 units for 151-250, 8 units for >250.  3 times daily prior . dispense 2 vials per month.    Losartan Potassium 50 Mg Tabs (Losartan potassium) ..... Once daily in the morning.  for blood pressure.  Labs Reviewed: Creat: 0.55 (04/22/2009)   Microalbumin: trace (08/20/2008)  Last Eye Exam: diabetic retinopathy (01/01/2007) Reviewed HgBA1c results: 7.1 (10/29/2009)  7.0 (07/16/2009)  Problem # 2:  HYPERCHOLESTEROLEMIA (ICD-272.0) good control.  I just switched her to lipitor because her pharmacy was apparently advising her against taking 80mg  dose of simva.  she may need to have this explained to her Her updated medication list for this problem includes:    Lipitor 40 Mg Tabs (Atorvastatin calcium) .Marland Kitchen... 1 tab by mouth at bedtime for cholesterol  Labs Reviewed: SGOT: 14 (04/22/2009)   SGPT: 14 (04/22/2009)  Prior 10 Yr Risk  Heart Disease: Not enough information (10/15/2006)   HDL:69 (08/20/2008), 72 (03/11/2007)  LDL:77 (08/20/2008), 102 (47/82/9562)  Chol:157 (08/20/2008), 184 (03/11/2007)  Trig:57 (08/20/2008), 51 (03/11/2007)  Problem # 3:  HYPERTENSION, BENIGN SYSTEMIC (ICD-401.1) Assessment: Comment Only may need increase in meds next visit, has been high most recent visit Her updated medication list for this problem includes:    Amlodipine Besylate 10 Mg Tabs (Amlodipine besylate) .Marland Kitchen... Take 1 tablet by mouth once a day    Metoprolol Tartrate 100 Mg Tabs (Metoprolol tartrate) .Marland Kitchen... 1 by mouth two times a day    Hydrochlorothiazide 25 Mg Tabs (Hydrochlorothiazide) .Marland Kitchen... 1/2 tablets daily    Losartan Potassium 50 Mg Tabs (Losartan potassium) ..... Once daily in the morning.  for blood pressure.  Prior BP: 149/77 (11/30/2009)  Prior 10 Yr Risk Heart Disease: Not enough information (10/15/2006)  Labs Reviewed: K+: 3.6 (04/22/2009) Creat: : 0.55 (04/22/2009)   Chol: 157 (08/20/2008)   HDL: 69 (08/20/2008)   LDL: 77 (08/20/2008)   TG: 57  (08/20/2008)  Problem # 4:  GERD (ICD-530.81) Assessment: Comment Only  Her updated medication list for this problem includes:    Ranitidine Hcl 150 Mg Caps (Ranitidine hcl) .Marland Kitchen..Marland Kitchen Two times a day.  Problem # 5:  RHINITIS, ALLERGIC (ICD-477.9) Assessment: Comment Only  Her updated medication list for this problem includes:    Fluticasone Propionate 50 Mcg/act Susp (Fluticasone propionate) .Marland Kitchen... 2 sprays each nostril once daily  Problem # 6:  NEUROPATHY, DIABETIC (ICD-250.60) Assessment: Comment Only not severe.  not on gabapentin Her updated medication list for this problem includes:    Bayer Childrens Aspirin 81 Mg Chew (Aspirin) .Marland Kitchen... Take 1 tablet by mouth once a day    Glucophage 1000 Mg Tabs (Metformin hcl) .Marland Kitchen... 1 tablet by mouth twice a day    Lantus 100 Unit/ml Soln (Insulin glargine) ..... Inject 9 units in the am and 10 units in the pm.   prior to breakfast and prior to evenng meal.   dispense qs x 55month    Novolog 100 Unit/ml Soln (Insulin aspart) ..... Sliding scale - zero for bg<100, 4 units for 100-150, 6 units for 151-250, 8 units for >250.  3 times daily prior . dispense 2 vials per month.    Losartan Potassium 50 Mg Tabs (Losartan potassium) ..... Once daily in the morning.  for blood pressure.  Problem # 7:  HEARING LOSS NOS OR DEAFNESS (ICD-389.9) Assessment: Comment Only always comes with interpretor  Problem # 8:  ASTHMA, UNSPECIFIED (ICD-493.90) Assessment: Comment Only  we have never really discussed this and she is on no meds for it  Complete Medication List: 1)  Amlodipine Besylate 10 Mg Tabs (Amlodipine besylate) .... Take 1 tablet by mouth once a day 2)  Bayer Childrens Aspirin 81 Mg Chew (Aspirin) .... Take 1 tablet by mouth once a day 3)  Glucophage 1000 Mg Tabs (Metformin hcl) .Marland Kitchen.. 1 tablet by mouth twice a day 4)  Metoprolol Tartrate 100 Mg Tabs (Metoprolol tartrate) .Marland Kitchen.. 1 by mouth two times a day 5)  Neurontin 300 Mg Caps (Gabapentin) .Marland Kitchen.. 1 pill  4x/day 6)  Lantus 100 Unit/ml Soln (Insulin glargine) .... Inject 9 units in the am and 10 units in the pm.   prior to breakfast and prior to evenng meal.   dispense qs x 55month 7)  Miralax Powd (Polyethylene glycol 3350) .Marland Kitchen.. 17g by mouth once daily as needed for constipation.  mix w/ 8 oz of water.  disp 1 large container  8)  Fluticasone Propionate 50 Mcg/act Susp (Fluticasone propionate) .... 2 sprays each nostril once daily 9)  Lipitor 40 Mg Tabs (Atorvastatin calcium) .Marland Kitchen.. 1 tab by mouth at bedtime for cholesterol 10)  Ranitidine Hcl 150 Mg Caps (Ranitidine hcl) .... Two times a day. 11)  Novolog 100 Unit/ml Soln (Insulin aspart) .... Sliding scale - zero for bg<100, 4 units for 100-150, 6 units for 151-250, 8 units for >250.  3 times daily prior . dispense 2 vials per month. 12)  Triamcinolone Acetonide 0.1 % Oint (Triamcinolone acetonide) .... Apply to abdomen once or twice a day.  dispense one large tube 13)  Hydrochlorothiazide 25 Mg Tabs (Hydrochlorothiazide) .... 1/2 tablets daily 14)  Bd Insulin Syringe Ultrafine 30g X 1/2" 1 Ml Misc (Insulin syringe-needle u-100) .... Use for 5 insulin injections daily.  dispense one month supply.  refill prn 15)  Mupirocin 2 % Oint (Mupirocin) .... Apply to affected area - under right breast. three times daily then as needed.  dispense 30 gram tube. 16)  Losartan Potassium 50 Mg Tabs (Losartan potassium) .... Once daily in the morning.  for blood pressure.   Past History:  Past Medical History:  5/06 [name]- 5.8cm ant body uterine fibroid s/p embolization,  anisocoria since childhood,  Bilat Deafness- 55yo meningitis,  diabetic gastroparesis, diabetic retinopathy, has had ha assessments, including MRI- unrevealing HTN diabetes with fluctuating sugars HLD  Past Surgical History: Reviewed history from 10/16/2008 and no changes required. 12/06 Uterine fibroids embolization - 10/12/2005 2-D Echo - nl 5/04 due to murmur - 11/20/2002 Appendectomy    cardiolite-EF77%no ischemia - 04/06/2005 cesarian section x2  ETT -intermediate Risk significant CAD-McDiarmid - 02/27/2005 Tubal ligation    Family History: Reviewed history from 10/16/2008 and no changes required. HTN mother +ovarian CA grandmother mother and sister have uterine fibroids no CAD/MI/DM, sudden death, no hypercholesterolemia aunt has "bone cancer"  Social History: Reviewed history from 10/16/2008 and no changes required. married lives w/ husband and three children; Husband is deaf also, children are not; Works in day care since 2000; No tobacco,occ  EtOH, rec drugs

## 2010-08-02 NOTE — Progress Notes (Signed)
Summary: RX Prob  Phone Note Call from Patient Call back at Home Phone [phone]   Caller: Patient Summary of Call: pt needs rx for lantus the diabetic center will not send anymore until August.  The pt would like the rx sent to [facility] [address]  Initial call taken by: Clydell Hakim,  December 23, 2008 9:06 AM      Prescriptions: LANTUS 100 UNIT/ML SOLN (INSULIN GLARGINE) inject 30 units daily.  Dispense QS x 47month  #1 vial x 12   Entered and Authorized by:   Asher Muir MD   Signed by:   Asher Muir MD on 12/23/2008   Method used:   [facility]ically to        [facility]  [address] [id number]       [address]       [place], [place]  [address]       Ph: [phone] or [phone]       Fax: [phone]   RxID:   [id number]  Prescription sent as requested.  Would you call pt and let her know.  Thanks  Appended Document: RX Prob Lm with son pt [facility] [address]  Son advises he will give message as pt is at work ..................Marland KitchenDelores Pate-Gaddy, CMA (AAMA) {DATETIMESTAMP()}

## 2010-08-02 NOTE — Miscellaneous (Signed)
Summary: walk in  Clinical Lists Changes she came in with form to be done for her diabetic supplies. she has an appt wed. asked her to bring that form with her to the appt. she agreed.Golden Circle RN  December 27, 2009 11:30 AM

## 2010-08-02 NOTE — Letter (Signed)
Summary: Generic Letter  [facility]  [address]   [place], [place] [address]   Phone: [phone]  Fax: [phone]    08/04/2009  Alicia [name] [address] [place], [place]  [address]  Dear Alicia [name],  Have attempted to call you concerning your blood sugars, but have not been able to reach you.  Dr Lafonda Mosses would like for you to make an appointment with the pharmacy clinic to discuss your diabetes medications at your earliest convenience.  She is concerned that you are having low blood sugar at times.  please call [phone] to schedule this appointment.    Sincerely,   Gladstone Pih

## 2010-08-02 NOTE — Assessment & Plan Note (Signed)
Summary: l arm hurts when she coughs   Vital Signs:  Patient Profile:   55 Years Old Female Height:     63 inches Weight:      184.9 pounds BMI:     32.87 Temp:     98.1 degrees F Pulse rate:   76 / minute BP sitting:   123 / 69  (left arm)  Pt. in pain?   yes    Location:   l arm    Intensity:   8-10  Vitals Entered By: Starleen Blue RN (June 04, 2007 11:00 AM)                  Chief Complaint:  L arm pain with cough.  History of Present Illness: 55 yo woman who returns today c/o L shoulder and upper arm pain w/ coughing at night.  Pt had URI 3-4 weeks ago and since has developed a persistant cough, mostly at night.  Pt has noticed that in last week her L shoulder has a shooting/throbbing pain when she has a coughing fit at night.  The pain never travels past her mid upper arm, she denies daytime sxs.  She has no SOB, no CP, no diaphoresis, no nausea, no DOE.  Pt has no relief w/ Tylenol but has not tried NSAID.  Pt's mother had told her to have her pain evaluated to make sure it was not cardiac in nature.  Current Allergies: ! SULFA ! * ALTACE SULFAMETHOXAZOLE (SULFAMETHOXAZOLE)  Past Surgical History:    Reviewed history from 03/15/2007 and no changes required:       12/06 Uterine fibroids embolization - 10/12/2005       2-D Echo - nl 5/04 due to murmur - 11/20/2002       Appendectomy        cardiolite-EF77%no ischemia - 04/06/2005       cesarian section x2        ECG NSR, NAD, no ST/TW changes - 12/14/2003       ETT -intermediate Risk significant CAD-McDiarmid - 02/27/2005       Tubal ligation    Family History:    Reviewed history from 03/15/2007 and no changes required:       HTN mother, +ovarian CA grandmother, mother and sister have uterine fibroids       no CAD/MI/DM, sudden death, no hypercholesterolemia  Social History:    Reviewed history from 08/30/2006 and no changes required:       married lives w/ husband and three children; Husband is deaf also,  children are not; Works in day care since 2000; No tobacco, EtOH, rec drugs    Review of Systems      See HPI   Physical Exam  General:     Well-developed,well-nourished,in no acute distress; alert,appropriate and cooperative throughout examination Chest Wall:     Intercostal muscles TTP on L Lungs:     Normal respiratory effort, chest expands symmetrically. Lungs are clear to auscultation, no crackles or wheezes. Heart:     Normal rate and regular rhythm. S1 and S2 normal without gallop, murmur, click, rub or other extra sounds. Msk:     FROM of shoulders bilaterally Some pain w/ infraspinatous testing No impingement signs Strength 5/5 of upper extremities bilaterally    Impression & Recommendations:  Problem # 1:  ARM, UPPER, PAIN (ICD-719.42) Assessment: New Pain is most likely related to muscle strain.  Very unlikely to be cardiac in nature given pt's excellent control  of risk factors and (-) Cath in 04/2005.  Also unlikely to be cardiac given lack of daytime sxs and associated sxs.  As pt's pain only results from coughing, will attempt to control cough w/ Tessalon Perles.  Also encouraged pt to use Ibuprofen, heating pad, and stretching for pain relief.  Pt expressed understanding of the plan and was relieved that it is unlikely cardiac.  Pt has appt scheduled next week for regular DM check- will reassess problem at that time.  Pt in agreement. Orders: [facility]- Est Level  3 ([address])   Complete Medication List: 1)  Amlodipine Besylate 10 Mg Tabs (Amlodipine besylate) .... Take 1 tablet by mouth once a day 2)  Bayer Childrens Aspirin 81 Mg Chew (Aspirin) .... Take 1 tablet by mouth once a day 3)  Glipizide 5 Mg Tb24 (Glipizide) .... Take 1 tablet by mouth once a day 4)  Glucophage 1000 Mg Tabs (Metformin hcl) .Marland Kitchen.. 1 tablet by mouth twice a day 5)  Metoclopramide Hcl 5 Mg Tabs (Metoclopramide hcl) .... Take 1 tablet by mouth as directed 6)  Metoprolol Succinate 200 Mg Tb24  (Metoprolol succinate) .... Take 1 tablet by mouth once a day 7)  Voltaren 75 Mg Tbec (Diclofenac sodium) .... Take 1 tablet by mouth twice a day 8)  Neurontin 300 Mg Caps (Gabapentin) .Marland Kitchen.. 1 pill 4x/day 9)  Lantus 100 Unit/ml Soln (Insulin glargine) .... Inject 30 units daily.  dispense qs x 41month 10)  Hydrochlorothiazide 12.5 Mg Tabs (Hydrochlorothiazide) .Marland Kitchen.. 1 tab by mouth daily 11)  Simvastatin 40 Mg Tabs (Simvastatin) .Marland Kitchen.. 1 tab by mouth at bedtime. 12)  Tessalon 200 Mg Caps (Benzonatate) .Marland Kitchen.. 1 by mouth three times a day as needed for cough   Patient Instructions: 1)  Follow up as scheduled on 12/12 2)  Take the Tessalon Perles as needed for cough 3)  Ibuprofen 600-800mg  as needed for pain every 8 hours 4)  Use the heating pad for symptomatic relief 5)  Gentle stretching of the Left arm will make it feel better    Prescriptions: TESSALON 200 MG  CAPS (BENZONATATE) 1 by mouth three times a day as needed for cough  #60 x 0   Entered and Authorized by:   Neena Rhymes  MD   Signed by:   Neena Rhymes  MD on 06/04/2007   Method used:   [facility] to ...       [facility]  [id number]       [address]       [place], [place]  [address]       Ph: [phone] or [phone]       Fax: [phone]   RxID:   [id number]  ]

## 2010-08-02 NOTE — Assessment & Plan Note (Signed)
Summary: 3 month fu/tabori pt/el    History of Present Illness: 55 y/o with history of deafness presents with  HTN - BP stable.  Current Meds: norvasc 10, metoprolol succ 200 daily, lisinopril/hctz 10/12.5.  No chest pain, dyspnea, headaches, dizziness, syncope, palpitations.  HL - On zocor 40mg .  Tolerating well without muscle aches or pains.  DM - On glipizide 5 mg, metformin 1000 two times a day, lantus 30 units.  She has had some night time hypoglycemia that responds to sugar drink.  No polyuria, polydipsia, fatigue, foot lesions.  Knee pain - Says that this has been a chronic issue that she usually doesn't bring up with her physician.  It is mainly in the right knee.  It is achey and intermittent and on the inside and worse after sitting for a long time or on days with bad weather.  No specific injury.  Cough - associated with a runny nose.  Been going on for 2-3 weeks.  history of allergic rhinitis.  non-productive.  sneezing.  Cough worse with deep inspiration.       Current Allergies: ! SULFA ! * ALTACE SULFAMETHOXAZOLE (SULFAMETHOXAZOLE)      Physical Exam  General:     Well-developed,well-nourished,in no acute distress; alert,appropriate and cooperative throughout examination, obese Ears:     External ear exam shows no significant lesions or deformities.  Otoscopic examination reveals clear canals, tympanic membranes are intact bilaterally without bulging, retraction, inflammation or discharge. Hearing is grossly normal bilaterally. Nose:     External nasal examination shows no deformity or inflammation. Nasal mucosa are pink and moist without lesions or exudates. Mouth:     Oral mucosa and oropharynx without lesions or exudates.  Teeth in good repair with multiple fillings. Neck:     No deformities, masses, or tenderness noted. Lungs:     Normal respiratory effort, chest expands symmetrically. Lungs are a little coarse, and she coughs reproducibly with deep  inspiration. Heart:     Normal rate and regular rhythm. S1 and S2 normal without gallop, murmur, click, rub or other extra sounds. Msk:     crepitus with preserved ROM of bilateral knees. both with mild effusions. Otherwise nontender.  Ligaments intact.    Impression & Recommendations:  Problem # 1:  HYPERTENSION, BENIGN SYSTEMIC (ICD-401.1) Assessment: Improved bp stable.  No changes. Her updated medication list for this problem includes:    Amlodipine Besylate 10 Mg Tabs (Amlodipine besylate) .Marland Kitchen... Take 1 tablet by mouth once a day    Metoprolol Succinate 200 Mg Tb24 (Metoprolol succinate) .Marland Kitchen... Take 1 tablet by mouth once a day    Lisinopril-hydrochlorothiazide 10-12.5 Mg Tabs (Lisinopril-hydrochlorothiazide) ..... One tab by mouth daily  Orders: [facility]- Est  Level 4 ([address])   Problem # 2:  DIABETES MELLITUS, II, COMPLICATIONS (ICD-250.92) Advised to decrease lantus to 25 units for now 2o to hypoglycemia.  Consider switching sulfonylurea for actos (less hypoglycemia).  For now she will titrate down 3 units for hypoglycemia and up 1 unit for cbg in AM > 120.  A1c improved at 7.2.  Her updated medication list for this problem includes:    Bayer Childrens Aspirin 81 Mg Chew (Aspirin) .Marland Kitchen... Take 1 tablet by mouth once a day    Glipizide 5 Mg Tb24 (Glipizide) .Marland Kitchen... Take 1 tablet by mouth once a day    Glucophage 1000 Mg Tabs (Metformin hcl) .Marland Kitchen... 1 tablet by mouth twice a day    Lantus 100 Unit/ml Soln (Insulin glargine) ..... Inject 30  units daily.  dispense qs x 47month    Lisinopril-hydrochlorothiazide 10-12.5 Mg Tabs (Lisinopril-hydrochlorothiazide) ..... One tab by mouth daily  Orders: A1C-[facility] (75643) [facility]- Est  Level 4 ([address])   Problem # 3:  RHINITIS, ALLERGIC (ICD-477.9) Assessment: Deteriorated  Her updated medication list for this problem includes:    Fluticasone Propionate 50 Mcg/act Susp (Fluticasone propionate) .Marland Kitchen... 2 sprays each nostril once daily  Orders: [facility]- Est   Level 4 ([address]214)   Problem # 4:  HYPERCHOLESTEROLEMIA (ICD-272.0) Assessment: Unchanged continue zocor. Her updated medication list for this problem includes:    Simvastatin 40 Mg Tabs (Simvastatin) .Marland Kitchen... 1 tab by mouth at bedtime.  Orders: [facility]- Est  Level 4 (88416)   Problem # 5:  ASTHMA, UNSPECIFIED (ICD-493.90) Assessment: Deteriorated No wheezing, but coarseness in lungs + reproducible cough may be c/w bronchitis.  albuterol mdi prescribed. Her updated medication list for this problem includes:    Albuterol 90 Mcg/act Aers (Albuterol) .Marland Kitchen... 2 puffs q 4 hours prn  Orders: [facility]- Est  Level 4 (60630)   Problem # 6:  ARTHRITIS, KNEE (ZSW-109.32) Assessment: New Advised tylenol schedlud three times a day 1000mg  and voltaren as needed would be good first treatment options. Orders: [facility]- Est  Level 4 (35573)   Complete Medication List: 1)  Amlodipine Besylate 10 Mg Tabs (Amlodipine besylate) .... Take 1 tablet by mouth once a day 2)  Bayer Childrens Aspirin 81 Mg Chew (Aspirin) .... Take 1 tablet by mouth once a day 3)  Glipizide 5 Mg Tb24 (Glipizide) .... Take 1 tablet by mouth once a day 4)  Glucophage 1000 Mg Tabs (Metformin hcl) .Marland Kitchen.. 1 tablet by mouth twice a day 5)  Metoclopramide Hcl 5 Mg Tabs (Metoclopramide hcl) .... Take 1 tablet by mouth as directed 6)  Metoprolol Succinate 200 Mg Tb24 (Metoprolol succinate) .... Take 1 tablet by mouth once a day 7)  Voltaren 75 Mg Tbec (Diclofenac sodium) .... Take 1 tablet by mouth twice a day 8)  Neurontin 300 Mg Caps (Gabapentin) .Marland Kitchen.. 1 pill 4x/day 9)  Lantus 100 Unit/ml Soln (Insulin glargine) .... Inject 30 units daily.  dispense qs x 47month 10)  Lisinopril-hydrochlorothiazide 10-12.5 Mg Tabs (Lisinopril-hydrochlorothiazide) .... One tab by mouth daily 11)  Simvastatin 40 Mg Tabs (Simvastatin) .Marland Kitchen.. 1 tab by mouth at bedtime. 12)  Tessalon 200 Mg Caps (Benzonatate) .Marland Kitchen.. 1 by mouth three times a day as needed for cough 13)   Nystatin-triamcinolone 100000-0.1 Unit/gm-% Oint (Nystatin-triamcinolone) .... Apply to affected area 2-3x/day.  disp 30 gm tube 14)  Miralax Powd (Polyethylene glycol 3350) .Marland Kitchen.. 17g by mouth once daily as needed for constipation.  mix w/ 8 oz of water.  disp 1 large container 15)  Fluticasone Propionate 50 Mcg/act Susp (Fluticasone propionate) .... 2 sprays each nostril once daily 16)  Albuterol 90 Mcg/act Aers (Albuterol) .... 2 puffs q 4 hours prn   Patient Instructions: 1)  I would change your lantus to 25 Units.  Every time you have a low blood sugar at night, decrease your Lantus by 3 Units thereafter.  Every time your blood sugar is > 120 in the morning increase your lantus by one unit thereafter. 2)  We started flonase that should help with allergies, cough, sneezing.  Take it in two sprays twice daily. 3)  I think your knee pain is arthritis and would recommend taking Tylenol arthritis 1000mg  three times daily and voltaren (diclofenac) as needed for further pain.    Prescriptions: ALBUTEROL 90 MCG/ACT  AERS (  ALBUTEROL) 2 puffs q 4 hours prn  #1 x 1   Entered and Authorized by:   Angeline Slim MD   Signed by:   Angeline Slim MD on 09/17/2007   Method used:   Electron[facility] sent to ...       [facility]  [id number]       [address]       [place], [place]  [address]       Ph: [phone] or [phone]       Fax: [phone]   RxID:   [id number] FLUTICASONE PROPIONATE 50 MCG/ACT  SUSP (FLUTICASONE PROPIONATE) 2 sprays each nostril once daily  #1 vial x 11   Entered and Authorized by:   Angeline Slim MD   Signed by:   Angeline Slim MD on 09/17/2007   Method used:   Electronically sent to ...       [facility]  [id number]       [address]       [place], [place]  [address]       Ph: [phone] or [phone]       Fax: [phone]   RxID:   [id number]  ] Laboratory Results   Blood Tests    Date/Time Received: September 17, 2007 4:18  PM  Date/Time Reported: September 17, 2007 4:30 PM   HGBA1C: 7.3%   (Normal Range: Non-Diabetic - 3-6%   Control Diabetic - 6-8%)  Comments: ...............test performed by......Marland KitchenBonnie A. [place], [place] (ASCP)

## 2010-08-02 NOTE — Assessment & Plan Note (Signed)
Summary: F/U dm and HTN - Rx Clinic   Vital Signs:  Patient profile:   55 year old female Height:      63 inches Weight:      168.8 pounds BMI:     30.01 BP sitting:   132 / 78  (left arm)  Primary Care Provider:  Asher Muir MD   History of Present Illness: Patient arrives with interpreter.    Patient concerned about cough and her increased blood sugars over the last few days.   She believes that some time she thinks the insulin is NOT getting in some time.    Upon review patient has used various lengths of needles and prefers the short ones.   She uses injection sites in her legs AND her abdomen.  She has NOT noticed any change in her blood sugars based on site of injection.   She is willing to consistently use her abdomen for shots in the near future to see if this helps with more consistent absorption.  She will also agree to using 1/2 inch needles to attempt deeper administration.   Cough is worrisome.Marland KitchenMarland KitchenShe was unable to purchase the prescribed cough syrup as the insurance would NOT cover and the cost was too expensive.   She describes the cough as dry and non-productive.   Blood sugars at home continue to be erratic.     Current Medications (verified): 1)  Amlodipine Besylate 10 Mg Tabs (Amlodipine Besylate) .... Take 1 Tablet By Mouth Once A Day 2)  Bayer Childrens Aspirin 81 Mg Chew (Aspirin) .... Take 1 Tablet By Mouth Once A Day 3)  Glucophage 1000 Mg Tabs (Metformin Hcl) .Marland Kitchen.. 1 Tablet By Mouth Twice A Day 4)  Metoprolol Tartrate 100 Mg Tabs (Metoprolol Tartrate) .Marland Kitchen.. 1 By Mouth Two Times A Day 5)  Neurontin 300 Mg  Caps (Gabapentin) .Marland Kitchen.. 1 Pill 4x/day 6)  Lantus 100 Unit/ml Soln (Insulin Glargine) .... Inject 22 Units Daily.  Dispense Qs X 64month 7)  Lisinopril-Hydrochlorothiazide 10-12.5 Mg  Tabs (Lisinopril-Hydrochlorothiazide) .... One Tab By Mouth Daily 8)  Miralax   Powd (Polyethylene Glycol 3350) .Marland Kitchen.. 17g By Mouth Once Daily As Needed For Constipation.   Mix W/ 8 Oz of Water.  Disp 1 Large Container 9)  Fluticasone Propionate 50 Mcg/act  Susp (Fluticasone Propionate) .... 2 Sprays Each Nostril Once Daily 10)  Simvastatin 80 Mg Tabs (Simvastatin) .Marland Kitchen.. 1 By Mouth At Bedtime 11)  Ranitidine Hcl 150 Mg Caps (Ranitidine Hcl) .... Two Times A Day. 12)  Novolog 100 Unit/ml Soln (Insulin Aspart) .... Use The Sliding Scale I Prescribed You 13)  Bacitracin 500 Unit/gm Oint (Bacitracin) .... Apply To Affected Area Three Times A Day Until Symptoms Resolve Disp: Large Tube 14)  Triamcinolone Acetonide 0.1 % Oint (Triamcinolone Acetonide) .... Apply To Rash On Leg Two Times A Day For 2 Weeks.  Dispense One Large Tube  Allergies (verified): 1)  ! Sulfa 2)  ! * Altace 3)  Sulfamethoxazole (Sulfamethoxazole)   Impression & Recommendations:  Problem # 1:  DIABETES MELLITUS, II, COMPLICATIONS (ICD-250.92) Assessment Unchanged  Diabetes of: many yrs duration currently under:  fair  control of blood glucose based on A1C of: 7 in January.  HOwever her erratic readings throughout the day are still concerning.   She is willing to use 1/2 inch needles and inject in her adbomen consistently to see if this helps improve consistency of control  Additionally we will try to split Lantus to improve basal consistency.  Sliding  scale prescribed yesterday will be continued but it will be used for all three meals.   Adjusted  basal insulin Lantus to 11 units tiwice daily.  Adjusted Novolog to ZERO units for CBGs<100,  6 units for CBGS 100-150, 8 units for CBGs 151-250, 10 units for CBGs > 250.  She was asked to call our ofice line for CBGs > 450.  Written pt instructions provided:   F/U Rx Clinic Visit: 3-4 weeks.   TTFFC: 45  mins.  Pt seen with: Karolee Stamps, PharmD Candidate and sign language interpreter.    The following medications were removed from the medication list:    Lisinopril-hydrochlorothiazide 10-12.5 Mg Tabs (Lisinopril-hydrochlorothiazide) ..... One tab by mouth  daily Her updated medication list for this problem includes:    Bayer Childrens Aspirin 81 Mg Chew (Aspirin) .Marland Kitchen... Take 1 tablet by mouth once a day    Glucophage 1000 Mg Tabs (Metformin hcl) .Marland Kitchen... 1 tablet by mouth twice a day    Lantus 100 Unit/ml Soln (Insulin glargine) ..... Inject 11 units in the am and 11 units in the pm.   prior to breakfast and prior to evenng meal.   dispense qs x 43month    Novolog 100 Unit/ml Soln (Insulin aspart) ..... Sliding scale - zero for bg<100, 6 units for 100-150, 8 units for 151-250, 10 units for >250.  3 times daily. dispense 2 vials per month.  Orders: Reassessment Each 15 min unit- [facility] (41660)  Problem # 2:  HYPERTENSION, BENIGN SYSTEMIC (ICD-401.1) Assessment: Unchanged Blood pressure near goal with multidrug regimen in patiet experiencing cough.  Cough may be due to virus OR possibly due to lisinopril.  Will stop combination lisinopril / HCTZ and provided HCTZ as 1/2 fo 25mg  tablet daily.  Reasses cough and BP at next visit.  Consider losartan if Cough improved.    The following medications were removed from the medication list:    Lisinopril-hydrochlorothiazide 10-12.5 Mg Tabs (Lisinopril-hydrochlorothiazide) ..... One tab by mouth daily Her updated medication list for this problem includes:    Amlodipine Besylate 10 Mg Tabs (Amlodipine besylate) .Marland Kitchen... Take 1 tablet by mouth once a day    Metoprolol Tartrate 100 Mg Tabs (Metoprolol tartrate) .Marland Kitchen... 1 by mouth two times a day    Hydrochlorothiazide 25 Mg Tabs (Hydrochlorothiazide) .Marland Kitchen... 1/2 tablets daily  Complete Medication List: 1)  Amlodipine Besylate 10 Mg Tabs (Amlodipine besylate) .... Take 1 tablet by mouth once a day 2)  Bayer Childrens Aspirin 81 Mg Chew (Aspirin) .... Take 1 tablet by mouth once a day 3)  Glucophage 1000 Mg Tabs (Metformin hcl) .Marland Kitchen.. 1 tablet by mouth twice a day 4)  Metoprolol Tartrate 100 Mg Tabs (Metoprolol tartrate) .Marland Kitchen.. 1 by mouth two times a day 5)  Neurontin 300 Mg  Caps (Gabapentin) .Marland Kitchen.. 1 pill 4x/day 6)  Lantus 100 Unit/ml Soln (Insulin glargine) .... Inject 11 units in the am and 11 units in the pm.   prior to breakfast and prior to evenng meal.   dispense qs x 43month 7)  Miralax Powd (Polyethylene glycol 3350) .Marland Kitchen.. 17g by mouth once daily as needed for constipation.  mix w/ 8 oz of water.  disp 1 large container 8)  Fluticasone Propionate 50 Mcg/act Susp (Fluticasone propionate) .... 2 sprays each nostril once daily 9)  Simvastatin 80 Mg Tabs (Simvastatin) .Marland Kitchen.. 1 by mouth at bedtime 10)  Ranitidine Hcl 150 Mg Caps (Ranitidine hcl) .... Two times a day. 11)  Novolog 100 Unit/ml Soln (Insulin aspart) .Marland KitchenMarland KitchenMarland Kitchen  Sliding scale - zero for bg<100, 6 units for 100-150, 8 units for 151-250, 10 units for >250.  3 times daily. dispense 2 vials per month. 12)  Bacitracin 500 Unit/gm Oint (Bacitracin) .... Apply to affected area three times a day until symptoms resolve disp: large tube 13)  Triamcinolone Acetonide 0.1 % Oint (Triamcinolone acetonide) .... Apply to rash on leg two times a day for 2 weeks.  dispense one large tube 14)  Hydrochlorothiazide 25 Mg Tabs (Hydrochlorothiazide) .... 1/2 tablets daily 15)  Bd Insulin Syringe Ultrafine 30g X 1/2" 1 Ml Misc (Insulin syringe-needle u-100) .... Use for 5 insulin injections daily.  dispense one month supply.  refill prn  Patient Instructions: 1)  Use new "long" needles and do all shots in your Belly (rotate sites). 2)  Change Lantus to 11 units in the AM prior to Breakfast and 11 units prior to Evening meal. 3)  Change Novolog to Sliding Scale with all of your 3 large meals.   4)  Call our office outside line if your blood sugar is over 450.  5)  STOP Lisinopril/HCTZ combination pill. 6)  Start taking 1/2 of the HCTZ (new prescription) daily.  Please let me know if this helps your COUGH.  7)  Return to Pharmacy Clinic for follow up in 3-4 weeks.  Prescriptions: NOVOLOG 100 UNIT/ML SOLN (INSULIN ASPART) Sliding Scale -  Zero for BG<100, 6 units for 100-150, 8 units for 151-250, 10 units for >250.  3 times daily. Dispense 2 vials per month.  #1 x 11   Entered by:   Madelon Lips Pharm D   Authorized by:   Asher Muir MD   Signed by:   Madelon Lips Pharm D on 09/10/2009   Method used:   [facility]  [id number]       [address]       [place], [place]  [address]       Ph: [phone] or [phone]       Fax: [phone]   RxID:   [id number] LANTUS 100 UNIT/ML SOLN (INSULIN GLARGINE) inject 11 units in the AM and 11 units in the PM.   Prior to Breakfast and Prior to [employer].   Dispense QS x 11month  #1 x 11   Entered by:   Madelon Lips Pharm D   Authorized by:   Asher Muir MD   Signed by:   Madelon Lips Pharm D on 09/10/2009   Method used:   [facility]        [facility]  [address] [id number]       [address]       [place], [place]  [address]       Ph: [phone] or [phone]       Fax: [phone]   RxID:   [id number] BD INSULIN SYRINGE ULTRAFINE 30G X 1/2" 1 ML MISC (INSULIN SYRINGE-NEEDLE U-100) Use for 5 insulin injections daily.  Dispense one month supply.  Refill PRN  #1 x 11   Entered by:   Madelon Lips Pharm D   Authorized by:   Asher Muir MD   Signed by:   Madelon Lips Pharm D on 09/10/2009   Method used:   [facility]        [facility]  [address] [phone]       [phone]       [place], [place]  [address]       Ph: [phone] or [phone]       Fax: [phone]   RxID:   [id number] HYDROCHLOROTHIAZIDE 25 MG TABS (HYDROCHLOROTHIAZIDE) 1/2 tablets daily  #30 x 2   Entered by:   Madelon Lips Pharm D   Authorized by:   Asher Muir MD   Signed by:   Madelon Lips Pharm D on 09/10/2009   Method used:   [facility]        [facility]  [address] [id number]       [address]       [place], [place]   [address]       Ph: [phone] or [phone]       Fax: [phone]   RxID:   [id number]   Prevention & Chronic Care Immunizations   Influenza vaccine: Fluvax 3+  (07/16/2009)   Influenza vaccine due: 06/13/2008    Tetanus booster: 12/06/2003: Done.   Tetanus booster due: 12/05/2013    Pneumococcal vaccine: Done.  (04/03/2003)   Pneumococcal vaccine due: None  Colorectal Screening   Hemoccult: Not documented   Hemoccult due: Not Indicated    Colonoscopy: Results: Internal Hemorrhoids.     Location:  [facility].     (01/31/2008)   Colonoscopy due: 01/30/2018  Other Screening   Pap smear: NEGATIVE FOR INTRAEPITHELIAL LESIONS OR MALIGNANCY.  (10/16/2008)   Pap smear due: 12/10/2008    Mammogram: normal  (10/12/2008)   Mammogram action/deferral: Screening mammogram in 1 year.     (10/08/2006)   Mammogram due: 10/12/2009   Smoking status: never  (08/27/2009)  Diabetes Mellitus   HgbA1C: 7.0  (07/16/2009)   Hemoglobin A1C due: 03/12/2008    Eye exam: diabetic retinopathy  (01/01/2007)   Eye exam due: 01/01/2008    Foot exam: yes  (10/16/2008)   High risk foot: Not documented   Foot care education: Not documented   Foot exam due: 02/06/2009    Urine microalbumin/creatinine ratio: Not documented   Urine microalbumin/cr due: 10/15/2007    Diabetes flowsheet reviewed?: Yes   Progress toward A1C goal: At goal  Lipids   Total Cholesterol: 157  (08/20/2008)   LDL: 77  (08/20/2008)   LDL Direct: 96  (12/11/2007)   HDL: 69  (08/20/2008)   Triglycerides: 57  (08/20/2008)    SGOT (AST): 14  (04/22/2009)   SGPT (ALT): 14  (04/22/2009)   Alkaline phosphatase: 93  (04/22/2009)   Total bilirubin: 0.3  (04/22/2009)    Lipid flowsheet reviewed?: Yes   Progress toward LDL goal: At goal  Hypertension   Last Blood Pressure: 132 / 78  (09/10/2009)   Serum creatinine: 0.55  (04/22/2009)   Serum potassium 3.6  (04/22/2009)    Hypertension flowsheet reviewed?:  Yes   Progress toward BP goal: Unchanged  Self-Management Support :   Personal Goals (by the next clinic visit) :     Personal A1C goal: 8  (04/22/2009)     Personal blood pressure goal: 130/80  (04/22/2009)     Personal LDL goal: 100  (04/22/2009)    Diabetes self-management support: Written self-care plan  (08/20/2009)    Diabetes self-management support not done because: Good outcomes  (04/22/2009)    Hypertension self-management support: Not documented    Hypertension self-management support not done because: Good outcomes  (08/20/2009)  Lipid self-management support: Not documented     Lipid self-management support not done because: Good outcomes  (09/10/2009)

## 2010-08-02 NOTE — Progress Notes (Signed)
Summary: Records  Phone Note From Other Clinic Call back at [phone]   Caller: Annabelle Harman @ [facility] Summary of Call: Needs most recent labs and office visit faxed to [phone]. Initial call taken by: Haydee Salter,  December 25, 2007 11:24 AM  Follow-up for Phone Call        faxed Follow-up by: Haydee Salter,  December 25, 2007 4:46 PM

## 2010-08-02 NOTE — Miscellaneous (Signed)
Summary: arm hurts when she coughs  Clinical Lists Changes   c/o l arm hurting when she coughs x 2 weeks. has appt with pcp on 12/12, but her family insists she get it checked sooner. Concerned about her heart. appt made for 11am today & called for an interpretor

## 2010-08-02 NOTE — Miscellaneous (Signed)
Summary: remove glipizide from med list  Clinical Lists Changes  Medications: Removed medication of GLIPIZIDE XL 10 MG TB24 (GLIPIZIDE) 1 tablet by mouth daily

## 2010-08-02 NOTE — Miscellaneous (Signed)
Summary: needs note that she is deaf  Clinical Lists Changes needs a note that she is deaf. needs this for her notice asking her to serve on a jury. wants it mailed to her home. told her I will take care of this today.Golden Circle RN  March 15, 2010 9:04 AM

## 2010-08-02 NOTE — Assessment & Plan Note (Signed)
Summary: cpp/[place]   Vital Signs:  Patient profile:   55 year old female Weight:      174.3 pounds O2 Sat:      97 % on Room air Temp:     97.5 degrees F oral Pulse rate:   88 / minute Pulse rhythm:   regular BP sitting:   145 / 91  (left arm) Cuff size:   regular  Vitals Entered By: Loralee Pacas CMA (February 21, 2010 8:38 AM)  Serial Vital Signs/Assessments:  Time      Position  BP       Pulse  Resp  Temp     By                     140/72                         Lloyd Huger MD 0950am              127/79   [address] CMA 10:55 AM            144/79   87                    Loralee Pacas CMA  CC: cpp   Primary Provider:  Lloyd Huger MD  CC:  cpp.  History of Present Illness: DM: C/o blood sugar being high and low due to problems with getting food. Trouble with finances and unable to eat proper meals, snacks. AM BS has been 101-200. This am had BS of 200, took 4 units of novolog then came to office. Has had lunchtime values of 20-30, and she gets some dizzyness. Eats peanut butter or syrup when this happens, then goes to work and eats food there. Had eye exam on August 8 of this year, gets check-up every 6 months.  HTN: has been in 140s when she checks at home.  Difficulty swallowing: has been going on for past 2 years, is episodic and feels tightness with swallowing solids. Water alleviates the feeling. No trouble with liquids.  Stress: has been having financial support stressors, trouble with SSI, straining her marriage. Can't afford groceries or bills. Sleeps short time at night, does have some anxiety. Also feels tired during the day.   Habits & Providers  Alcohol-Tobacco-Diet     Tobacco Status: never  Allergies: 1)  ! Sulfa 2)  ! * Altace  Past History:  Past Medical History: Last updated: 12/28/2009  5/06 [name]- 5.8cm ant body uterine fibroid s/p embolization,  anisocoria since childhood,  [place] Deafness- 55yo meningitis,  diabetic  gastroparesis, diabetic retinopathy, has had ha assessments, including MRI- unrevealing HTN diabetes with fluctuating sugars HLD  Past Surgical History: Last updated: 10/16/2008 12/06 Uterine fibroids embolization - 10/12/2005 2-D Echo - nl 5/04 due to murmur - 11/20/2002 Appendectomy  cardiolite-EF77%no ischemia - 04/06/2005 cesarian section x2  ETT -intermediate Risk significant CAD-McDiarmid - 02/27/2005 Tubal ligation   Family History: Last updated: 10/16/2008 HTN mother +ovarian CA grandmother mother and sister have uterine fibroids no CAD/MI/DM, sudden death, no hypercholesterolemia aunt has "bone cancer"  Social History: Last updated: 10/16/2008 married lives w/ husband and three children; Husband is deaf also, children are not; Works in day care since 2000; No tobacco,occ  EtOH, rec drugs  Risk Factors: Exercise: no (10/15/2006)  Risk  Factors: Smoking Status: never (02/21/2010)  Review of Systems  The patient denies fever, weight loss, weight gain, hoarseness, chest pain, and peripheral edema.    Physical Exam  General:  Well-developed,well-nourished,in no acute distress; Some confusion through interpreter, could not answer few questions directly. Head:  normocephalic and atraumatic.   Eyes:  Right pupil dilated. EOMI Ears:  TMs clear bilaterally Mouth:  No pharyngeal exudates or lesions. Good dentition. Neck:  No deformities, masses, or tenderness noted. Lungs:  Normal respiratory effort, chest expands symmetrically. Lungs are clear to auscultation, no crackles or wheezes. Heart:  Normal rate and regular rhythm. S1 and S2 normal without gallop, murmur, click, rub or other extra sounds. Msk:  No deformity or scoliosis noted of thoracic or lumbar spine.   Pulses:  R and L carotid,radial,femoral,dorsalis pedis and posterior tibial pulses are full and equal bilaterally Extremities:  No clubbing, cyanosis, edema, or deformity noted with normal full range of motion of  all joints.   Neurologic:  alert & oriented X3, cranial nerves II-XII intact, strength normal in all extremities, and sensation intact to light touch.   Skin:  Intact without suspicious lesions or rashes Psych:  Memory for recent events was decreased during interview.   Diabetes Management Exam:    Foot Exam (with socks and/or shoes not present):       Sensory-Pinprick/Light touch:          Left medial foot (L-4): normal          Left dorsal foot (L-5): normal          Left lateral foot (S-1): normal          Right medial foot (L-4): normal          Right dorsal foot (L-5): normal          Right lateral foot (S-1): normal       Sensory-Monofilament:          Left foot: normal          Right foot: normal       Inspection:          Left foot: normal          Right foot: normal       Nails:          Left foot: normal          Right foot: normal    Eye Exam:       Eye Exam done elsewhere          Date: 02/07/2010          Results: diabetic retinopathy          Done by: Opthalmology   Impression & Recommendations:  Problem # 1:  HYPOGLYCEMIA (ICD-251.2)  She became more confused and diaphoretic throughout exam, FSBS was 19. Given 100g glucose, mt. dew, PB crackers and rechecked q15 min. Repeated at 37, 62, 71, 110 .  Pt never lost consciousness and recovered mentation. Recommended she stop her novolog sliding scale for now, since she bottomed out after awakening with a BS of 200 and eating breakfast. Hypoglycemia in 20-30 range has become somewhat common for the patient. Will f/u with Dr. Raymondo Band in pharm clinic ASAP for guidance on avoiding hypoglycemia (perhaps an error in her dosing the insulin or confusing novolog with lantus?).  Also wrote prescription for glucagon pen for hypoglycemic emergencies.   Orders: [facility]- Est  Level 4 ([address])  Problem # 2:  DIABETES MELLITUS, II, COMPLICATIONS (  ICD-250.92) A1C was 7.2 today, up from 7.1 in April, 2011. Eye exam was completed in August  2011 per patient. Foot exam wnl. Will continue diabetic regimen for now, with the exception of novolog. Increasing herARB for HTN goal <130.   The following medications were removed from the medication list:    Novolog 100 Unit/ml Soln (Insulin aspart) ..... Sliding scale - zero for bg<100, 4 units for 100-150, 6 units for 151-250, 8 units for >250.  3 times daily prior . dispense 2 vials per month.    Losartan Potassium 50 Mg Tabs (Losartan potassium) ..... Once daily in the morning.  for blood pressure. Her updated medication list for this problem includes:    Bayer Childrens Aspirin 81 Mg Chew (Aspirin) .Marland Kitchen... Take 1 tablet by mouth once a day    Glucophage 1000 Mg Tabs (Metformin hcl) .Marland Kitchen... 1 tablet by mouth twice a day    Lantus 100 Unit/ml Soln (Insulin glargine) ..... Inject 9 units in the am and 10 units in the pm.   prior to breakfast and prior to evenng meal.   dispense qs x 71month    Losartan Potassium 100 Mg Tabs (Losartan potassium) .Marland Kitchen... Take one by mouth daily  Orders: A1C-[facility] (16109) Glucose Cap-[facility] (60454) [facility]- Est  Level 4 (09811)  Problem # 3:  HYPERCHOLESTEROLEMIA (ICD-272.0) Due for FLP, however had eaten breakfast. Obtain d-LDL today.   Her updated medication list for this problem includes:    Lipitor 40 Mg Tabs (Atorvastatin calcium) .Marland Kitchen... 1 tab by mouth at bedtime for cholesterol  Orders: Direct LDL-[facility] ([address]1478-29562) [facility]- Est  Level 4 (13086)  Problem # 4:  HYPERTENSION, BENIGN SYSTEMIC (ICD-401.1) Decreased metoprolol dose to 50mg  two times a day as it seemed to be masking hypoglycemia symptoms. Increased the losartan to 100mg  daily. Continue HCTZ and amlodipine.   The following medications were removed from the medication list:    Metoprolol Tartrate 100 Mg Tabs (Metoprolol tartrate) .Marland Kitchen... 1 by mouth two times a day    Losartan Potassium 50 Mg Tabs (Losartan potassium) ..... Once daily in the morning.  for blood pressure. Her updated medication list for this  problem includes:    Amlodipine Besylate 10 Mg Tabs (Amlodipine besylate) .Marland Kitchen... Take 1 tablet by mouth once a day    Hydrochlorothiazide 25 Mg Tabs (Hydrochlorothiazide) .Marland Kitchen... 1/2 tablets daily    Metoprolol Tartrate 50 Mg Tabs (Metoprolol tartrate) .Marland Kitchen... Take 1 by mouth twice daily    Losartan Potassium 100 Mg Tabs (Losartan potassium) .Marland Kitchen... Take one by mouth daily  Problem # 5:  DYSPHAGIA UNSPECIFIED (ICD-787.20) Pt wanted to discuss this before she became symptomatically hypoglycemic. Has been episodically present for >2 years, only with certain solid foods. Potentially related to anxiety and psychosocial stress as she frequently has trouble with sleeping also. She had no trouble eating crackers with peanut butter today in office. Will f/u problem with patient as needed.   Complete Medication List: 1)  Amlodipine Besylate 10 Mg Tabs (Amlodipine besylate) .... Take 1 tablet by mouth once a day 2)  Bayer Childrens Aspirin 81 Mg Chew (Aspirin) .... Take 1 tablet by mouth once a day 3)  Glucophage 1000 Mg Tabs (Metformin hcl) .Marland Kitchen.. 1 tablet by mouth twice a day 4)  Neurontin 300 Mg Caps (Gabapentin) .Marland Kitchen.. 1 pill 4x/day 5)  Lantus 100 Unit/ml Soln (Insulin glargine) .... Inject 9 units in the am and 10 units in the pm.   prior to breakfast and prior to evenng meal.  dispense qs x 28month 6)  Miralax Powd (Polyethylene glycol 3350) .Marland Kitchen.. 17g by mouth once daily as needed for constipation.  mix w/ 8 oz of water.  disp 1 large container 7)  Fluticasone Propionate 50 Mcg/act Susp (Fluticasone propionate) .... 2 sprays each nostril once daily 8)  Lipitor 40 Mg Tabs (Atorvastatin calcium) .Marland Kitchen.. 1 tab by mouth at bedtime for cholesterol 9)  Ranitidine Hcl 150 Mg Caps (Ranitidine hcl) .... Two times a day. 10)  Triamcinolone Acetonide 0.1 % Oint (Triamcinolone acetonide) .... Apply to abdomen once or twice a day.  dispense one large tube 11)  Hydrochlorothiazide 25 Mg Tabs (Hydrochlorothiazide) .... 1/2  tablets daily 12)  Bd Insulin Syringe Ultrafine 30g X 1/2" 1 Ml Misc (Insulin syringe-needle u-100) .... Use for 5 insulin injections daily.  dispense one month supply.  refill prn 13)  Mupirocin 2 % Oint (Mupirocin) .... Apply to affected area - under right breast. three times daily then as needed.  dispense 30 gram tube. 14)  Metoprolol Tartrate 50 Mg Tabs (Metoprolol tartrate) .... Take 1 by mouth twice daily 15)  Losartan Potassium 100 Mg Tabs (Losartan potassium) .... Take one by mouth daily   Patient Instructions: 1)  Stop taking novolog insulin with meals. 2)  Please schedule appointment with Dr. Raymondo Band to talk about your diabetes and insulin. 3)  Please take lower dose of Metoprolol 50 mg two times a day. 4)  We are going up on Losartan to 100mg  daily for your blood pressure. Prescriptions: METOPROLOL TARTRATE 50 MG TABS (METOPROLOL TARTRATE) take 1 by mouth twice daily  #62 x 6   Entered and Authorized by:   Lloyd Huger MD   Signed by:   Lloyd Huger MD on 02/21/2010   Method used:   Electronically to        [facility]  [address] [id number] (retail)       [address]       [place], [place]  [address]       Ph: [phone] or [phone]       Fax: [phone]   RxID:   [id number] LOSARTAN POTASSIUM 100 MG TABS (LOSARTAN POTASSIUM) take one by mouth daily  #31 x 6   Entered and Authorized by:   Lloyd Huger MD   Signed by:   Lloyd Huger MD on 02/21/2010   Method used:   Electronically to        [facility]  [address] [id number] (retail)       [address]       [place], [place]  [address]       Ph: [phone] or [phone]       Fax: [phone]   RxID:   [id number] METOPROLOL TARTRATE 50 MG TABS (METOPROLOL TARTRATE) take 1 by mouth daily  #31 x 6   Entered and Authorized by:   Lloyd Huger MD   Signed by:   Lloyd Huger MD on 02/21/2010   Method used:   [facility]ically to        [facility]  [address] [id number] ([facility]       [address]       [place], [place]  [address]       Ph: [phone] or [phone]       Fax: [phone]   RxID:   [id number]   Laboratory Results   Blood Tests  Date/Time Received: February 21, 2010 9:41 AM  Date/Time Reported: February 21, 2010 11:21 AM   HGBA1C: 7.2%   (Normal Range: Non-Diabetic - 3-6%   Control Diabetic - 6-8%)  Comments: ...............test performed by......Marland KitchenBonnie A. [place], MLS (ASCP)cm      Prevention & Chronic Care Immunizations   Influenza vaccine: Fluvax 3+  (07/16/2009)   Influenza vaccine due: 06/13/2008    Tetanus booster: 12/06/2003: Done.   Tetanus booster due: 12/05/2013    Pneumococcal vaccine: Done.  (04/03/2003)   Pneumococcal vaccine due: None  Colorectal Screening   Hemoccult: Not documented   Hemoccult due: Not Indicated    Colonoscopy: Results: Internal Hemorrhoids.     Location:  [facility].     (01/31/2008)   Colonoscopy action/deferral: Deferred  (02/21/2010)   Colonoscopy due: 01/30/2018  Other Screening   Pap smear: NEGATIVE FOR INTRAEPITHELIAL LESIONS OR MALIGNANCY.  (10/16/2008)   Pap smear action/deferral: Deferred-3 yr interval  (02/21/2010)   Pap smear due: 10/17/2011    Mammogram: No specific mammographic evidence of malignancy.    (10/12/2009)   Mammogram action/deferral: Deferred  (02/21/2010)   Mammogram due: 10/2010   Smoking status: never  (02/21/2010)  Diabetes Mellitus   HgbA1C: 7.2  (02/21/2010)   Hemoglobin A1C due: 03/12/2008    Eye exam: diabetic retinopathy  (02/07/2010)   Eye exam due: 02/2011    Foot exam: yes  (02/21/2010)   High risk foot: Not documented   Foot care education: Not documented   Foot exam due: 02/22/2011    Urine microalbumin/creatinine ratio: Not documented   Urine microalbumin action/deferral: Not indicated   Urine microalbumin/cr due: 10/15/2007    Diabetes flowsheet reviewed?: Yes   Progress toward A1C goal: Unchanged   Diabetes comments:  Hypoglycemic today  Lipids   Total Cholesterol: 157  (08/20/2008)   Lipid panel action/deferral: LDL Direct ordered   LDL: 77  (08/20/2008)   LDL Direct: 65  (11/30/2009)   HDL: 69  (08/20/2008)   Triglycerides: 57  (08/20/2008)   Lipid panel due: 03/24/2010    SGOT (AST): 14  (04/22/2009)   SGPT (ALT): 14  (04/22/2009)   Alkaline phosphatase: 93  (04/22/2009)   Total bilirubin: 0.3  (04/22/2009)    Lipid flowsheet reviewed?: Yes   Progress toward LDL goal: Unchanged  Hypertension   Last Blood Pressure: 145 / 91  (02/21/2010)   Serum creatinine: 0.55  (04/22/2009)   Serum potassium 3.6  (04/22/2009)   Basic metabolic panel due: 03/24/2010    Hypertension flowsheet reviewed?: Yes   Progress toward BP goal: Unchanged  Self-Management Support :   Personal Goals (by the next clinic visit) :     Personal A1C goal: 8  (04/22/2009)     Personal blood pressure goal: 130/80  (04/22/2009)     Personal LDL goal: 100  (04/22/2009)    Diabetes self-management support: CBG self-monitoring log, Written self-care plan  (10/12/2009)    Diabetes self-management support not done because: Good outcomes  (04/22/2009)    Hypertension self-management support: Written self-care plan  (10/12/2009)    Hypertension self-management support not done because: Good outcomes  (10/29/2009)    Lipid self-management support: Not documented     Lipid self-management support not done because: Good outcomes  (10/29/2009)

## 2010-08-02 NOTE — Miscellaneous (Signed)
Summary: MISSED ETT APPT  Clinical Lists Changes When we called to remind her of her ETT today, she c/o HA & stated her sugar must be low. We told her to eat & drink. She also wanted to reschedule missed stress test from this am. she thought test was at 9 am. When we told her she had not missed it yet, she said she did not want to come today, would lik eto reschedule.[place] Circle RN  February 16, 2009 9:00 AM I would not offer to reschedule her htrough [facility] clinic here for ETT appt. If this needs to be done, I would recommend referra;l to cardiology as we have limited slots and she essentially no showed this one

## 2010-08-02 NOTE — Miscellaneous (Signed)
Summary: referral for stress test  Clinical Lists Changes  Orders: Added new Referral order of Cardiology Referral (Cardiology) - Signed

## 2010-08-02 NOTE — Assessment & Plan Note (Signed)
Summary: fu dm wp   Vital Signs:  Patient Profile:   55 Years Old Female Height:     63 inches Weight:      189.6 pounds BMI:     33.71 Temp:     98.8 degrees F oral Pulse rate:   83 / minute BP sitting:   129 / 78  (left arm)  Pt. in pain?   no  Vitals Entered By: Garen Grams LPN (June 14, 2007 8:47 AM)              Is Patient Diabetic? Yes      Chief Complaint:  F/u on dm.  History of Present Illness: Pt is 55 yo woman who RTC today for DM check up 1) DM- Pt reports AM CBG today was 101.  Did not bring meter or log book.  Admits to recently indulging in holiday parties and not following her diabetic diet as closely as before.  Denies hypoglycemic episodes, SOB, CP, edema, or visual changes.  Has appt scheduled w/ eye doctor on 07/22/07.  2) HTN- BP well controlled today.  Taking meds as directed w/out difficulty.  Denies CP, SOB, HA, visual changes.  3) Chest and arm pain- completely resolved.  Having some difficulty w/ reflux sxs on an intermittant basis but states this is relieved w/ Tums and other OTC products.  Also notes bowel irregularity and some problems w/ bloating.  Pt admits her diet is not high in fiber, water, and she does not exercise regularly.    Current Allergies: ! SULFA ! * ALTACE SULFAMETHOXAZOLE (SULFAMETHOXAZOLE)  Past Medical History:    Reviewed history from 12/07/2006 and no changes required:        5/06 [name]- 5.8cm ant body fibroid s/p embolization,        anisocoria since childhood,        [place] Deafness- 55yo meningitis,        diabetic gastroparesis, diabetic retinopathy, has had ha assessments, including MRI- unrevealing   Family History:    Reviewed history from 03/15/2007 and no changes required:       HTN mother, +ovarian CA grandmother, mother and sister have uterine fibroids       no CAD/MI/DM, sudden death, no hypercholesterolemia  Social History:    Reviewed history from 08/30/2006 and no changes required:       married  lives w/ husband and three children; Husband is deaf also, children are not; Works in day care since 2000; No tobacco, EtOH, rec drugs    Review of Systems      See HPI   Physical Exam  General:     Well-developed,well-nourished,in no acute distress; alert,appropriate and cooperative throughout examination Head:     Normocephalic and atraumatic without obvious abnormalities. No apparent alopecia or balding. Eyes:     pupils round, pupils reactive to light, and anisocoria.   Neck:     No deformities, masses, or tenderness noted. Lungs:     Normal respiratory effort, chest expands symmetrically. Lungs are clear to auscultation, no crackles or wheezes. Heart:     Normal rate and regular rhythm. S1 and S2 normal without gallop, murmur, click, rub or other extra sounds. Abdomen:     Bowel sounds positive,abdomen soft and non-tender without masses, organomegaly or hernias noted. Pulses:     +2 DP and radial pulses Extremities:     No clubbing, cyanosis, edema, or deformity noted with normal full range of motion of all joints.   Skin:  Intact without suspicious lesions or rashes  Diabetes Management Exam:    Foot Exam (with socks and/or shoes not present):       Sensory-Pinprick/Light touch:          Left medial foot (L-4): normal          Left dorsal foot (L-5): normal          Left lateral foot (S-1): normal          Right medial foot (L-4): normal          Right dorsal foot (L-5): normal          Right lateral foot (S-1): normal       Sensory-Monofilament:          Left foot: normal          Right foot: normal       Inspection:          Left foot: normal          Right foot: normal       Nails:          Left foot: normal          Right foot: normal    Impression & Recommendations:  Problem # 1:  HYPERTENSION, BENIGN SYSTEMIC (ICD-401.1) Assessment: Unchanged Pt's BP remains well controlled.  Continue current regimen at this time. Her updated medication list for  this problem includes:    Amlodipine Besylate 10 Mg Tabs (Amlodipine besylate) .Marland Kitchen... Take 1 tablet by mouth once a day    Metoprolol Succinate 200 Mg Tb24 (Metoprolol succinate) .Marland Kitchen... Take 1 tablet by mouth once a day    Hydrochlorothiazide 12.5 Mg Tabs (Hydrochlorothiazide) .Marland Kitchen... 1 tab by mouth daily  Orders: [facility]- Est  Level 4 (16109)   Problem # 2:  DIABETES MELLITUS, II, COMPLICATIONS (ICD-250.92) Assessment: Deteriorated A1C has slightly deteriorated.  Pt admits to over indulgence in holiday activities.  States she will pay closer attention to carb intake.  Will not make changes at this time due to the holiday season but will follow closely at next visit.  Pt expressed understanding. Her updated medication list for this problem includes:    Bayer Childrens Aspirin 81 Mg Chew (Aspirin) .Marland Kitchen... Take 1 tablet by mouth once a day    Glipizide 5 Mg Tb24 (Glipizide) .Marland Kitchen... Take 1 tablet by mouth once a day    Glucophage 1000 Mg Tabs (Metformin hcl) .Marland Kitchen... 1 tablet by mouth twice a day    Lantus 100 Unit/ml Soln (Insulin glargine) ..... Inject 30 units daily.  dispense qs x 32month  Orders: A1C-[facility] (60454) [facility]- Est  Level 4 (09811)   Problem # 3:  ARM, UPPER, PAIN (ICD-719.42) Assessment: Improved Pt's chest and arm pain has completely resolved.  Pt to continue taking Tums or other OTC products for her intermittant reflux. Orders: [facility]- Est  Level 4 (99214)   Problem # 4:  CONSTIPATION (ICD-564.0) Assessment: New Pt w/ bowel irregularity.  Will start Miralax to help w/ regularity.  Pt in agreement. Her updated medication list for this problem includes:    Miralax Powd (Polyethylene glycol 3350) .Marland KitchenMarland KitchenMarland KitchenMarland Kitchen 17g by mouth once daily as needed for constipation.  mix w/ 8 oz of water.  disp 1 large container  Orders: [facility]- Est  Level 4 (99214)   Complete Medication List: 1)  Amlodipine Besylate 10 Mg Tabs (Amlodipine besylate) .... Take 1 tablet by mouth once a day 2)  Bayer Childrens Aspirin 81  Mg Chew (Aspirin) .Marland KitchenMarland KitchenMarland Kitchen  Take 1 tablet by mouth once a day 3)  Glipizide 5 Mg Tb24 (Glipizide) .... Take 1 tablet by mouth once a day 4)  Glucophage 1000 Mg Tabs (Metformin hcl) .Marland Kitchen.. 1 tablet by mouth twice a day 5)  Metoclopramide Hcl 5 Mg Tabs (Metoclopramide hcl) .... Take 1 tablet by mouth as directed 6)  Metoprolol Succinate 200 Mg Tb24 (Metoprolol succinate) .... Take 1 tablet by mouth once a day 7)  Voltaren 75 Mg Tbec (Diclofenac sodium) .... Take 1 tablet by mouth twice a day 8)  Neurontin 300 Mg Caps (Gabapentin) .Marland Kitchen.. 1 pill 4x/day 9)  Lantus 100 Unit/ml Soln (Insulin glargine) .... Inject 30 units daily.  dispense qs x 14month 10)  Hydrochlorothiazide 12.5 Mg Tabs (Hydrochlorothiazide) .Marland Kitchen.. 1 tab by mouth daily 11)  Simvastatin 40 Mg Tabs (Simvastatin) .Marland Kitchen.. 1 tab by mouth at bedtime. 12)  Tessalon 200 Mg Caps (Benzonatate) .Marland Kitchen.. 1 by mouth three times a day as needed for cough 13)  Nystatin-triamcinolone 100000-0.1 Unit/gm-% Oint (Nystatin-triamcinolone) .... Apply to affected area 2-3x/day.  disp 30 gm tube 14)  Miralax Powd (Polyethylene glycol 3350) .Marland Kitchen.. 17g by mouth once daily as needed for constipation.  mix w/ 8 oz of water.  disp 1 large container  Other Orders: Flu Vaccine 80yrs + (95093) Admin 1st Vaccine (26712)   Patient Instructions: 1)  Please schedule a follow-up appointment in 3 months. 2)  Your blood pressure looks GREAT!  Keep taking your medicines! 3)  Take the Miralax as needed for constipation and bloating 4)  Take Tums as needed for reflux- this might help the discomfort in your chest.  If not, try Ibuprofen for muscle or bone pain.    Prescriptions: MIRALAX   POWD (POLYETHYLENE GLYCOL 3350) 17g by mouth once daily as needed for constipation.  Mix w/ 8 oz of water.  Disp 1 large container  #1 x 3   Entered and Authorized by:   Neena Rhymes  MD   Signed by:   Neena Rhymes  MD on 06/14/2007   Method used:   Electron[facility] sent to ...       [facility]   [address] [id number]       [address]       [place], [place]  [address]       Ph: [phone] or [phone]       Fax: [phone]   RxID:   [id number] NYSTATIN-TRIAMCINOLONE 100000-0.1 UNIT/GM-%  OINT (NYSTATIN-TRIAMCINOLONE) Apply to affected area 2-3x/day.  Disp 30 gm tube  #30 x 3   Entered and Authorized by:   Neena Rhymes  MD   Signed by:   Neena Rhymes  MD on 06/14/2007   Method used:   Electronically sent to ...       [facility]  [address] [id number]       [address]       [place], [place]  [address]       Ph: [phone] or [phone]       Fax: [phone]   RxID:   [id number]  ] Laboratory Results   Blood Tests   Date/Time Received: June 14, 2007 9:03 AM  Date/Time Reported: June 14, 2007 9:18 AM   HGBA1C: 7.6%   (Normal Range: Non-Diabetic - 3-6%   Control Diabetic - 6-8%)  Comments: ...................................................................[facility]  June 14, 2007 9:19 AM       Orders Added: 1)  A1C-[facility] [83036] 2)  Flu Vaccine 11yrs + [90658] 3)  Admin 1st Vaccine [90471] 4)  [facility]- Est  Level 4 [69629]    Influenza Vaccine    Vaccine Type: Fluvax 3+    Site: right deltoid    Mfr: Sanofi Pasteur    Dose: 0.5 ml    Route: IM    Given by: ASHA BENTON LPN    Exp. Date: 12/31/2007    Lot #: [id number]    VIS given: 01/24/07 version given June 14, 2007.  Flu Vaccine Consent Questions    Do you have a history of severe allergic reactions to this vaccine? no    Any prior history of allergic reactions to egg and/or gelatin? no    Do you have a sensitivity to the preservative Thimersol? no    Do you have a past history of Guillan-Barre Syndrome? no    Do you currently have an acute febrile illness? no    Have you ever had a severe reaction to latex? no    Vaccine information given and explained to patient? yes    Are you currently pregnant? no

## 2010-08-02 NOTE — Assessment & Plan Note (Signed)
Summary: 72m fu wp   Vital Signs:  Patient Profile:   55 Years Old Female Height:     63 inches Weight:      175.5 pounds BMI:     31.20 Temp:     98.1 degrees F oral Pulse rate:   78 / minute BP sitting:   127 / 78  (left arm)  Vitals Entered By: Asher Muir MD (July 24, 2008 9:15 AM)             Is Patient Diabetic? Yes     PCP:  Asher Muir MD  Chief Complaint:  f/u DM, HLD, HTN, and chest  pain.  History of Present Illness: Pt here with interpreter for:  1.  DM--states she ate more than she should over the holidays.  No problems with hyperglycemia.  On metformin and lantus 30units.  Did not bring sugar log.    2.  chest pain--At left breast, sharp.  lasts for seconds.  can be reproduced with palpation.  no exertional component.  no sob, n/v, or diaphoresis.  (records indicate previous dx of costochondritis).    3.  HTN--no problems with bp meds.  On lisinopril-hctz, amlodipine, and metop.  at goal today.  4.  HLD--due for FLP.  On simva 80mg .      Updated Prior Medication List: AMLODIPINE BESYLATE 10 MG TABS (AMLODIPINE BESYLATE) Take 1 tablet by mouth once a day B[employer] ASPIRIN 81 MG CHEW (ASPIRIN) Take 1 tablet by mouth once a day GLUCOPHAGE 1000 MG TABS (METFORMIN HCL) 1 tablet by mouth twice a day METOCLOPRAMIDE HCL 5 MG TABS (METOCLOPRAMIDE HCL) Take 1 tablet by mouth as directed METOPROLOL TARTRATE 100 MG TABS (METOPROLOL TARTRATE) 1 by mouth two times a day NEURONTIN 300 MG  CAPS (GABAPENTIN) 1 pill 4x/day LANTUS 100 UNIT/ML SOLN (INSULIN GLARGINE) inject 30 units daily.  Dispense QS x 65month LISINOPRIL-HYDROCHLOROTHIAZIDE 10-12.5 MG  TABS (LISINOPRIL-HYDROCHLOROTHIAZIDE) One tab by mouth daily MIRALAX   POWD (POLYETHYLENE GLYCOL 3350) 17g by mouth once daily as needed for constipation.  Mix w/ 8 oz of water.  Disp 1 large container FLUTICASONE PROPIONATE 50 MCG/ACT  SUSP (FLUTICASONE PROPIONATE) 2 sprays each nostril once daily  ALBUTEROL 90 MCG/ACT  AERS (ALBUTEROL) 2 puffs q 4 hours prn SIMVASTATIN 80 MG TABS (SIMVASTATIN) 1 by mouth at bedtime RANITIDINE HCL 150 MG CAPS (RANITIDINE HCL) two times a day. NYSTATIN-TRIAMCINOLONE 100000-0.1 UNIT/GM-% OINT (NYSTATIN-TRIAMCINOLONE) apply to affected area two times a day-tid  Current Allergies: ! SULFA ! * ALTACE SULFAMETHOXAZOLE (SULFAMETHOXAZOLE)       Physical Exam  General:     Well-developed,well-nourished,in no acute distress; alert,appropriate and cooperative throughout examination Head:     Normocephalic and atraumatic without obvious abnormalities. No apparent alopecia or balding. Chest Wall:     TTP over left breast--right at the place she was describing the chest pain Lungs:     Normal respiratory effort, chest expands symmetrically. Lungs are clear to auscultation, no crackles or wheezes. Heart:     Normal rate and regular rhythm. S1 and S2 normal without gallop, murmur, click, rub or other extra sounds. Msk:     feet without lesions or abnormalities; good pulses    Impression & Recommendations:  Problem # 1:  COSTOCHONDRITIS (ICD-733.6) Assessment: Unchanged Her sxs are completely c/w musculoskeletal chest pain.  Because of her risk factors, went ahead with EKG, which was normal.  She also  had a normal cardiolite in 2006. Advised tylenol for the pain.  Reevaluate  if this does not work. EKG muscle stress test Orders: [facility]- Est  Level 4 (11914)   Problem # 2:  DIABETES MELLITUS, II, COMPLICATIONS (ICD-250.92) Assessment: Unchanged A1C slightly improved despite her self report of increased food intake over the holidays.  Continue current regimen.  Could probably get to goal with diet and exercise.  Discussed this with the pt.   Her updated medication list for this problem includes:    Bayer Childrens Aspirin 81 Mg Chew (Aspirin) .Marland Kitchen... Take 1 tablet by mouth once a day    Glucophage 1000 Mg Tabs (Metformin hcl) .Marland Kitchen... 1 tablet by mouth  twice a day    Lantus 100 Unit/ml Soln (Insulin glargine) ..... Inject 30 units daily.  dispense qs x 89month    Lisinopril-hydrochlorothiazide 10-12.5 Mg Tabs (Lisinopril-hydrochlorothiazide) ..... One tab by mouth daily  Orders: A1C-[facility] (78295) [facility]- Est  Level 4 (62130)   Problem # 3:  HYPERTENSION, BENIGN SYSTEMIC (ICD-401.1) Assessment: Unchanged At goal.  No changes Her updated medication list for this problem includes:    Amlodipine Besylate 10 Mg Tabs (Amlodipine besylate) .Marland Kitchen... Take 1 tablet by mouth once a day    Metoprolol Tartrate 100 Mg Tabs (Metoprolol tartrate) .Marland Kitchen... 1 by mouth two times a day    Lisinopril-hydrochlorothiazide 10-12.5 Mg Tabs (Lisinopril-hydrochlorothiazide) ..... One tab by mouth daily  Orders: [facility]- Est  Level 4 (99214)   Problem # 4:  HYPERCHOLESTEROLEMIA (ICD-272.0) Assessment: Unchanged Due for FLP.  Asked pt to come in next week for this.   Her updated medication list for this problem includes:    Simvastatin 80 Mg Tabs (Simvastatin) .Marland Kitchen... 1 by mouth at bedtime  Orders: [facility]- Est  Level 4 (99214)   Problem # 5:  OBESITY (ICD-278.00) Assessment: Unchanged Discussed diet and exercise with pt.    Complete Medication List: 1)  Amlodipine Besylate 10 Mg Tabs (Amlodipine besylate) .... Take 1 tablet by mouth once a day 2)  Bayer Childrens Aspirin 81 Mg Chew (Aspirin) .... Take 1 tablet by mouth once a day 3)  Glucophage 1000 Mg Tabs (Metformin hcl) .Marland Kitchen.. 1 tablet by mouth twice a day 4)  Metoclopramide Hcl 5 Mg Tabs (Metoclopramide hcl) .... Take 1 tablet by mouth as directed 5)  Metoprolol Tartrate 100 Mg Tabs (Metoprolol tartrate) .Marland Kitchen.. 1 by mouth two times a day 6)  Neurontin 300 Mg Caps (Gabapentin) .Marland Kitchen.. 1 pill 4x/day 7)  Lantus 100 Unit/ml Soln (Insulin glargine) .... Inject 30 units daily.  dispense qs x 89month 8)  Lisinopril-hydrochlorothiazide 10-12.5 Mg Tabs (Lisinopril-hydrochlorothiazide) .... One tab by mouth daily 9)  Miralax Powd  (Polyethylene glycol 3350) .Marland Kitchen.. 17g by mouth once daily as needed for constipation.  mix w/ 8 oz of water.  disp 1 large container 10)  Fluticasone Propionate 50 Mcg/act Susp (Fluticasone propionate) .... 2 sprays each nostril once daily 11)  Albuterol 90 Mcg/act Aers (Albuterol) .... 2 puffs q 4 hours prn 12)  Simvastatin 80 Mg Tabs (Simvastatin) .Marland Kitchen.. 1 by mouth at bedtime 13)  Ranitidine Hcl 150 Mg Caps (Ranitidine hcl) .... Two times a day. 14)  Nystatin-triamcinolone 100000-0.1 Unit/gm-% Oint (Nystatin-triamcinolone) .... Apply to affected area two times a day-tid   Patient Instructions: 1)  It was nice to see you today.  2)  Please schedule a follow-up appointment in 3 months. 3)  Try taking tylenol (acetaminophen) for the pain in your chest.  Take 2 tablets every 6 hours as needed for the pain.  This may help with your back  pain as well. 4)  Try exercising a little more.  This will probably help bring your A1C down a little.   5)  Please come in some time in the next few weeks for labs.  Don't eat or drink anything (except water or black coffee) before the labs.     Prescriptions: FLUTICASONE PROPIONATE 50 MCG/ACT  SUSP (FLUTICASONE PROPIONATE) 2 sprays each nostril once daily  #1 vial x 11   Entered and Authorized by:   Asher Muir MD   Signed by:   Asher Muir MD on 07/24/2008   Method used:   [facility] to        [facility]  [address] [id number]       [address]       [place], [place]  [address]       Ph: [phone] or [phone]       Fax: [phone]   RxID:   [id number]   Laboratory Results   Blood Tests   Date/Time Received: July 24, 2008 9:12 AM  Date/Time Reported: July 24, 2008 9:28 AM   HGBA1C: 7.7%   (Normal Range: Non-Diabetic - 3-6%   Control Diabetic - 6-8%)  Comments: ............test performed by...........Marland Kitchen Terese Door, CMA .............entered by...........Marland KitchenBonnie A. [place], [place] (ASCP)

## 2010-08-02 NOTE — Assessment & Plan Note (Signed)
Summary: Diabetes - Rx Clinic   Vital Signs:  Patient profile:   55 year old female Height:      63 inches Weight:      169.4 pounds BMI:     30.12 Pulse rate:   72 / minute BP sitting:   141 / 67  (right arm)  Primary Care Provider:  Lloyd Huger MD   History of Present Illness: Appears pleasent and in good spirits, had interperter [place]) to assist with visit. All meds were brought to visit as well as BG log. Control of BG seems to be under good control with a few episode of mid-night hypoglycemia. Also complains of frequent headachs which are thought to be associated with elevated BP. When [facility] it typically runs around 120/80.    Current Medications (verified): 1)  Amlodipine Besylate 10 Mg Tabs (Amlodipine Besylate) .... Take 1 Tablet By Mouth Once A Day 2)  Bayer Childrens Aspirin 81 Mg Chew (Aspirin) .... Take 1 Tablet By Mouth Once A Day 3)  Glucophage 1000 Mg Tabs (Metformin Hcl) .Marland Kitchen.. 1 Tablet By Mouth Twice A Day 4)  Neurontin 300 Mg  Caps (Gabapentin) .Marland Kitchen.. 1 Pill 4x/day 5)  Lantus 100 Unit/ml Soln (Insulin Glargine) .... Inject 15 Units in The Am Prior To Breakfastl.   Dispense Qs X 18month 6)  Miralax   Powd (Polyethylene Glycol 3350) .Marland Kitchen.. 17g By Mouth Once Daily As Needed For Constipation.  Mix W/ 8 Oz of Water.  Disp 1 Large Container 7)  Fluticasone Propionate 50 Mcg/act  Susp (Fluticasone Propionate) .... 2 Sprays Each Nostril Once Daily 8)  Lipitor 40 Mg Tabs (Atorvastatin Calcium) .Marland Kitchen.. 1 Tab By Mouth At Bedtime For Cholesterol 9)  Bd Insulin Syringe Ultrafine 30g X 1/2" 1 Ml Misc (Insulin Syringe-Needle U-100) .... Use For 5 Insulin Injections Daily.  Dispense One Month Supply.  Refill Prn  Allergies: 1)  ! Sulfa 2)  ! * Altace   Impression & Recommendations:  Problem # 1:  DIABETES MELLITUS, II, COMPLICATIONS (ICD-250.92) Assessment Improved  Long standing diabetes under good control of blood glucose based on A1C of 7.6 fasting CBGs  averag of 150-200.  Control is suboptimal due to fragile DM.  Three episodes of hypoglycemia in the middle of night in 30's; (This is less than previously) reported seeing green and being tachycardic. Able to verbalize appropriate hypoglycemia management plan.  Adjusted basal insulin Lantus to 10 units QAM and restarted Novolog SSI prior to meals, 2 units base and +1 unit if BS 150-250 or +2 units if BS > 250. Provided BG log as well.  Current goal is to maintain control and prevent BG readings < 80. Written pt instructions provided:   F/U Rx Clinic Visit:  in 3 weeks TTFFC:  30 mins.  Pt seen with: Kristine Linea, PharmD and Hoy Register, PharmD candidate.   Her updated medication list for this problem includes:    Bayer Childrens Aspirin 81 Mg Chew (Aspirin) .Marland Kitchen... Take 1 tablet by mouth once a day    Glucophage 1000 Mg Tabs (Metformin hcl) .Marland Kitchen... 1 tablet by mouth twice a day    Lantus 100 Unit/ml Soln (Insulin glargine) ..... Inject 10 units in the am prior to breakfastl.   dispense qs x 18month    Novolog 100 Unit/ml Soln (Insulin aspart) ..... Inject 2 units prior to each meal.  if pre-meal blood glucose is 150-250 give 3 units total and if blood glucose is >250  give 4 units total.    Losartan Potassium 100 Mg Tabs (Losartan potassium) ..... One daily  Orders: Reassessment Each 15 min unit- [facility] (64332)  Problem # 2:  HYPERTENSION, BENIGN SYSTEMIC (ICD-401.1) Assessment: Unchanged  Reports persistent headaches while at work which are thought to be related to HTN. [facility] and is reported to be around 120/80 at those times but BP has not been checked at the time of headaches. All meds were continued as before and HTN will be reevaluated at next visit. May consider restarting HCTZ at low dose or clonidine.  Her updated medication list for this problem includes:    Amlodipine Besylate 10 Mg Tabs (Amlodipine besylate) .Marland Kitchen... Take 1 tablet by mouth once a day    Losartan Potassium 100 Mg  Tabs (Losartan potassium) ..... One daily    Metoprolol Tartrate 100 Mg Tabs (Metoprolol tartrate) .Marland Kitchen... Take 1/2 tablet daily  Orders: Reassessment Each 15 min unit- [facility] (95188)  Problem # 3:  OBESITY (ICD-278.00) Assessment: Unchanged Continue current exercise and meal plan.   Will work to continue working on losing weight.   Complete Medication List: 1)  Amlodipine Besylate 10 Mg Tabs (Amlodipine besylate) .... Take 1 tablet by mouth once a day 2)  Bayer Childrens Aspirin 81 Mg Chew (Aspirin) .... Take 1 tablet by mouth once a day 3)  Glucophage 1000 Mg Tabs (Metformin hcl) .Marland Kitchen.. 1 tablet by mouth twice a day 4)  Neurontin 300 Mg Caps (Gabapentin) .Marland Kitchen.. 1 pill 4x/day 5)  Lantus 100 Unit/ml Soln (Insulin glargine) .... Inject 10 units in the am prior to breakfastl.   dispense qs x 67month 6)  Miralax Powd (Polyethylene glycol 3350) .Marland Kitchen.. 17g by mouth once daily as needed for constipation.  mix w/ 8 oz of water.  disp 1 large container 7)  Fluticasone Propionate 50 Mcg/act Susp (Fluticasone propionate) .... 2 sprays each nostril once daily 8)  Lipitor 40 Mg Tabs (Atorvastatin calcium) .Marland Kitchen.. 1 tab by mouth at bedtime for cholesterol 9)  Bd Insulin Syringe Ultrafine 30g X 1/2" 1 Ml Misc (Insulin syringe-needle u-100) .... Use for 5 insulin injections daily.  dispense one month supply.  refill prn 10)  Novolog 100 Unit/ml Soln (Insulin aspart) .... Inject 2 units prior to each meal.  if pre-meal blood glucose is 150-250 give 3 units total and if blood glucose is >250 give 4 units total. 11)  Losartan Potassium 100 Mg Tabs (Losartan potassium) .... One daily 12)  Metoprolol Tartrate 100 Mg Tabs (Metoprolol tartrate) .... Take 1/2 tablet daily  Patient Instructions: 1)  Change Lantus to 10 units each morning.  2)  Restart Novolog.  Give 2 units prior to each meal.  If blood sugar prior to meal is 150-250 give 3 units and if blood sugar prior to meal is >250 give 4 units.  3)  I know 4 shots per day  is hard to do BUT you will have much better readings if you can follow this plan.  4)  Continue your exercise plan - Stay busy and active.  5)  Please record any blood pressure readings you take during the next three weeks.  6)  Return to the pharmacist clinic in three weeks.  Prescriptions: METOPROLOL TARTRATE 100 MG TABS (METOPROLOL TARTRATE) Take 1/2 tablet daily  #1 x 0   Entered and Authorized by:   Christian Mate D   Signed by:   Madelon Lips Pharm D on 03/25/2010   Method used:  Historical   RxID:   [id number] LOSARTAN POTASSIUM 100 MG TABS (LOSARTAN POTASSIUM) one daily  #1 x 0   Entered and Authorized by:   Christian Mate D   Signed by:   Madelon Lips Pharm D on 03/25/2010   Method used:   Historical   RxID:   [id number] NOVOLOG 100 UNIT/ML SOLN (INSULIN ASPART) Inject 2 units prior to each meal.  If pre-meal blood glucose is 150-250 give 3 units total and if blood glucose is >250 give 4 units total.  #1 x 5   Entered by:   Madelon Lips Pharm D   Authorized by:   Lloyd Huger MD   Signed by:   Madelon Lips Pharm D on 03/25/2010   Method used:   [facility]  [address] [id number]       [address]       [place], [place]  [address]       Ph: [phone] or [phone]       Fax: [phone]   RxID:   [id number] LANTUS 100 UNIT/ML SOLN (INSULIN GLARGINE) inject 10 units in the AM Prior to Breakfastl.   Dispense QS x 79month  #1 x 5   Entered by:   Madelon Lips Pharm D   Authorized by:   Lloyd Huger MD   Signed by:   Madelon Lips Pharm D on 03/25/2010   Method used:   [facility]  [address] [id number]       [address]       [place], [place]  [address]       Ph: [phone] or [phone]       Fax: [phone]   RxID:   [id number]   Prevention & Chronic Care Immunizations   Influenza vaccine: Fluvax 3+  (07/16/2009)   Influenza vaccine due: 06/13/2008     Tetanus booster: 12/06/2003: Done.   Tetanus booster due: 12/05/2013    Pneumococcal vaccine: Done.  (04/03/2003)   Pneumococcal vaccine due: None  Colorectal Screening   Hemoccult: Not documented   Hemoccult due: Not Indicated    Colonoscopy: Results: Internal Hemorrhoids.     Location:  [facility].     (01/31/2008)   Colonoscopy action/deferral: Deferred  (02/21/2010)   Colonoscopy due: 01/30/2018  Other Screening   Pap smear: NEGATIVE FOR INTRAEPITHELIAL LESIONS OR MALIGNANCY.  (10/16/2008)   Pap smear action/deferral: Deferred-3 yr interval  (02/21/2010)   Pap smear due: 10/17/2011    Mammogram: No specific mammographic evidence of malignancy.    (10/12/2009)   Mammogram action/deferral: Deferred  (02/21/2010)   Mammogram due: 10/2010   Smoking status: never  (02/21/2010)  Diabetes Mellitus   HgbA1C: 7.2  (02/21/2010)   Hemoglobin A1C due: 03/12/2008    Eye exam: diabetic retinopathy  (02/07/2010)   Eye exam due: 02/2011    Foot exam: yes  (02/21/2010)   High risk foot: Not documented   Foot care education: Not documented   Foot exam due: 02/22/2011    Urine microalbumin/creatinine ratio: Not documented   Urine microalbumin action/deferral: Not indicated   Urine microalbumin/cr due: 10/15/2007    Diabetes flowsheet reviewed?: Yes   Progress toward A1C goal: At goal  Lipids   Total Cholesterol: 157  (08/20/2008)   Lipid panel action/deferral: LDL Direct ordered   LDL: 77  (  08/20/2008)   LDL Direct: 75  (02/21/2010)   HDL: 69  (08/20/2008)   Triglycerides: 57  (08/20/2008)   Lipid panel due: 03/24/2010    SGOT (AST): 14  (04/22/2009)   SGPT (ALT): 14  (04/22/2009)   Alkaline phosphatase: 93  (04/22/2009)   Total bilirubin: 0.3  (04/22/2009)    Lipid flowsheet reviewed?: Yes   Progress toward LDL goal: At goal  Hypertension   Last Blood Pressure: 141 / 67  (03/25/2010)   Serum creatinine: 0.55  (04/22/2009)   Serum potassium 3.6   (04/22/2009)   Basic metabolic panel due: 03/24/2010    Hypertension flowsheet reviewed?: Yes   Progress toward BP goal: At goal  Self-Management Support :   Personal Goals (by the next clinic visit) :     Personal A1C goal: 8  (04/22/2009)     Personal blood pressure goal: 130/80  (04/22/2009)     Personal LDL goal: 100  (04/22/2009)    Diabetes self-management support: CBG self-monitoring log, Written self-care plan  (10/12/2009)    Diabetes self-management support not done because: Good outcomes  (04/22/2009)    Hypertension self-management support: Written self-care plan  (10/12/2009)    Hypertension self-management support not done because: Good outcomes  (10/29/2009)    Lipid self-management support: Not documented     Lipid self-management support not done because: Good outcomes  (10/29/2009)

## 2010-08-02 NOTE — Progress Notes (Signed)
Summary: Test results  Phone Note Call from Patient Call back at [phone]   Summary of Call: Pt is requesting test results from last week. Initial call taken by: Haydee Salter,  December 25, 2007 9:27 AM  Follow-up for Phone Call        WILL FORWARD TO dR TABORI Follow-up by: Alphia Kava,  December 25, 2007 11:42 AM  Additional Follow-up for Phone Call Additional follow up Details #1::        checking status....states someone called yesterday Additional Follow-up by: Haydee Salter,  December 26, 2007 9:21 AM    Additional Follow-up for Phone Call Additional follow up Details #2::    Pt's labs and xrays have all been completely normal.  Pt is supposed to have appt upcoming to discuss all of this in person as she is deaf and requires interpreter and phone conversations must be carried out through a 3rd party.  She was told that unless her labs were abnormal, we would talk about them at her next visit- however since she has called numerous times, please inform her that all tests were normal in regards to her arm pain. Follow-up by: Neena Rhymes  MD,  December 30, 2007 8:31 AM  Additional Follow-up for Phone Call Additional follow up Details #3:: Details for Additional Follow-up Action Taken: pt's daughter notified. she will keep her appt today with Dr Beverely Low Additional Follow-up by: Alphia Kava,  December 30, 2007 8:47 AM

## 2010-08-02 NOTE — Assessment & Plan Note (Signed)
Summary: Diabetes Follow-Up - Rx Clinic    Vital Signs:  Patient profile:   55 year old female Weight:      164.5 pounds BMI:     29.25 Pulse rate:   87 / minute BP sitting:   135 / 85  (left arm)  Primary Care Provider:  Lloyd Huger MD   History of Present Illness: Patient arrives in good spirits.  She is excited about the improvements she has seen in her T2DM due to her weight loss.  Patient walks 1 hour every morning and tries to hula hoop for 15 minutes every day.    According to her glucose diary, her fasting levels are lower than desired with a range from 71-157.  Her meal time glucose levels have leveled out with the implementation of meal time insulin.   Patient is using Lantus 10 units in the morning, Novolog sliding scale(2,3, or 4 units) at meals, and metformin 1000 mg bid.       Jodene Nam - Interpreter     Current Medications (verified): 1)  Amlodipine Besylate 10 Mg Tabs (Amlodipine Besylate) .... Take 1 Tablet By Mouth Once A Day 2)  Bayer Childrens Aspirin 81 Mg Chew (Aspirin) .... Take 1 Tablet By Mouth Once A Day 3)  Glucophage 1000 Mg Tabs (Metformin Hcl) .Marland Kitchen.. 1 Tablet By Mouth Twice A Day 4)  Neurontin 300 Mg  Caps (Gabapentin) .Marland Kitchen.. 1 Pill 4x/day 5)  Lantus 100 Unit/ml Soln (Insulin Glargine) .... Inject 10 Units in The Am Prior To Breakfastl.   Dispense Qs X 19month 6)  Miralax   Powd (Polyethylene Glycol 3350) .Marland Kitchen.. 17g By Mouth Three Times Per Week As Needed For Constipation.  Mix W/ 8 Oz of Water.  Disp 1 Large Container 7)  Fluticasone Propionate 50 Mcg/act  Susp (Fluticasone Propionate) .... 2 Sprays Each Nostril Once Daily 8)  Lipitor 40 Mg Tabs (Atorvastatin Calcium) .Marland Kitchen.. 1 Tab By Mouth At Bedtime For Cholesterol 9)  Bd Insulin Syringe Ultrafine 30g X 1/2" 1 Ml Misc (Insulin Syringe-Needle U-100) .... Use For 5 Insulin Injections Daily.  Dispense One Month Supply.  Refill Prn 10)  Novolog 100 Unit/ml Soln (Insulin Aspart) .... Inject 2 Units Prior To Each  Meal.  If Pre-Meal Blood Glucose Is 150-250 Give 3 Units Total and If Blood Glucose Is >250 Give 4 Units Total. 11)  Losartan Potassium 100 Mg Tabs (Losartan Potassium) .... One Daily 12)  Metoprolol Tartrate 100 Mg Tabs (Metoprolol Tartrate) .... Take One Tablet Twice Daily  Allergies (verified): 1)  ! Sulfa 2)  ! * Altace   Impression & Recommendations:  Problem # 1:  DIABETES MELLITUS, II, COMPLICATIONS (ICD-250.92) Assessment Improved  Diabetes currently under good  control based on A1C of:  7.3  fasting CBGs of:  77-157 with average of approximately 100.  Control is suboptimal due to low glucose levels at fasting level.:  Denies hypoglycemic events.  She has solid grasp on what she needs to do as a diabetic patient.  She currently checks her BG levels at least 3 times daily, exercises daily, and is compliant with her medications.  Adjusted basal insulin Lantus to 8 units in the morning due to low fasting levels.  We continued the meal time insulin plan of Novolog sliding scale(2-4 units) and metformin 1000 mg two times a day.  Goal is to continue weight loss and eventually remove insulin from her pharmacotherapy regimen.  Goal weight at next visit is in the 150s.  Written pt instructions provided:   F/U Rx Clinic Visit: 6 weeks TTFFC:  mins. 25   Pt seen with:  Linward Headland, PharmD candidate and Jodene Nam- Interpreter.     Bayer Childrens Aspirin 81 Mg Chew (Aspirin) .Marland Kitchen... Take 1 tablet by mouth once a day    Glucophage 1000 Mg Tabs (Metformin hcl) .Marland Kitchen... 1 tablet by mouth twice a day    Lantus 100 Unit/ml Soln (Insulin glargine) ..... Inject 8 units in the am prior to breakfastl.   dispense qs x 66month    Novolog 100 Unit/ml Soln (Insulin aspart) ..... Inject 2 units prior to each meal.  if pre-meal blood glucose is 150-250 give 3 units total and if blood glucose is >250 give 4 units total.    Losartan Potassium 100 Mg Tabs (Losartan potassium) ..... One daily  Orders: Reassessment  Each 15 min unit- [facility] ([address]21)  Problem # 2:  HYPERTENSION, BENIGN SYSTEMIC (ICD-401.1) Assessment: Improved  Pt is currently uncontrolled on her hypertension regimen.  Most up-to-date reading was 135/85.  Patient was d/c on acei/hctz combination medication due to chronic cough and started on ARB.  BP readings are continuing to improve with weight loss.   If patient continues to be uncontrolled after weight loss, consider addition of low dose HCTZ to regimen.      The following medications were removed from the medication list:    Hydrochlorothiazide 25 Mg Tabs (Hydrochlorothiazide) .Marland Kitchen... Take one half tablet po daily. Her updated medication list for this problem includes:    Amlodipine Besylate 10 Mg Tabs (Amlodipine besylate) .Marland Kitchen... Take 1 tablet by mouth once a day    Losartan Potassium 100 Mg Tabs (Losartan potassium) ..... One daily    Metoprolol Tartrate 100 Mg Tabs (Metoprolol tartrate) .Marland Kitchen... Take one tablet twice daily  [facility]: [id number]  Complete Medication List: 1)  Amlodipine Besylate 10 Mg Tabs (Amlodipine besylate) .... Take 1 tablet by mouth once a day 2)  Bayer Childrens Aspirin 81 Mg Chew (Aspirin) .... Take 1 tablet by mouth once a day 3)  Glucophage 1000 Mg Tabs (Metformin hcl) .Marland Kitchen.. 1 tablet by mouth twice a day 4)  Neurontin 300 Mg Caps (Gabapentin) .Marland Kitchen.. 1 pill 4x/day 5)  Lantus 100 Unit/ml Soln (Insulin glargine) .... Inject 8 units in the am prior to breakfastl.   dispense qs x 66month 6)  Miralax Powd (Polyethylene glycol 3350) .Marland Kitchen.. 17g by mouth three times per week as needed for constipation.  mix w/ 8 oz of water.  disp 1 large container 7)  Fluticasone Propionate 50 Mcg/act Susp (Fluticasone propionate) .... 2 sprays each nostril once daily 8)  Lipitor 40 Mg Tabs (Atorvastatin calcium) .Marland Kitchen.. 1 tab by mouth at bedtime for cholesterol 9)  Bd Insulin Syringe Ultrafine 30g X 1/2" 1 Ml Misc (Insulin syringe-needle u-100) .... Use for 5  insulin injections daily.  dispense one month supply.  refill prn 10)  Novolog 100 Unit/ml Soln (Insulin aspart) .... Inject 2 units prior to each meal.  if pre-meal blood glucose is 150-250 give 3 units total and if blood glucose is >250 give 4 units total. 11)  Losartan Potassium 100 Mg Tabs (Losartan potassium) .... One daily 12)  Metoprolol Tartrate 100 Mg Tabs (Metoprolol tartrate) .... Take one tablet twice daily  Patient Instructions: 1)  Great to see you today.  2)  Keep exercising! 3)  Change lantus to 8 units each morning.   4)  Continue same dose of  of Novolog - 2 units, 3 units, and 4 units. Follow Up - next visit in Rx Clinic in 6 weeks (Next Year).  Prescriptions: LANTUS 100 UNIT/ML SOLN (INSULIN GLARGINE) inject 8 units in the AM Prior to Breakfastl.   Dispense QS x 19month  #1 x 0   Entered and Authorized by:   Christian Mate D   Signed by:   Madelon Lips Pharm D on 05/16/2010   Method used:   Historical   RxID:   [id number]    Orders Added: 1)  Reassessment Each 15 min unit- [facility] [[id number]]    Prevention & Chronic Care Immunizations   Influenza vaccine: Fluvax Non-MCR  (05/13/2010)   Influenza vaccine due: 06/13/2008    Tetanus booster: 12/06/2003: Done.   Tetanus booster due: 12/05/2013    Pneumococcal vaccine: Done.  (04/03/2003)   Pneumococcal vaccine due: None  Colorectal Screening   Hemoccult: Not documented   Hemoccult due: Not Indicated    Colonoscopy: Results: Internal Hemorrhoids.     Location:  [facility].     (01/31/2008)   Colonoscopy action/deferral: Deferred  (02/21/2010)   Colonoscopy due: 01/30/2018  Other Screening   Pap smear: NEGATIVE FOR INTRAEPITHELIAL LESIONS OR MALIGNANCY.  (10/16/2008)   Pap smear action/deferral: Deferred-3 yr interval  (02/21/2010)   Pap smear due: 10/17/2011    Mammogram: No specific mammographic evidence of malignancy.    (10/12/2009)   Mammogram action/deferral: Deferred  (02/21/2010)    Mammogram due: 10/2010   Smoking status: never  (02/21/2010)  Diabetes Mellitus   HgbA1C: 7.3  (05/13/2010)   Hemoglobin A1C due: 03/12/2008    Eye exam: diabetic retinopathy  (02/07/2010)   Eye exam due: 02/08/2011    Foot exam: yes  (05/13/2010)   High risk foot: Not documented   Foot care education: Not documented   Foot exam due: 02/22/2011    Urine microalbumin/creatinine ratio: Not documented   Urine microalbumin action/deferral: Not indicated   Urine microalbumin/cr due: 10/15/2007    Diabetes flowsheet reviewed?: Yes   Progress toward A1C goal: At goal  Lipids   Total Cholesterol: 157  (08/20/2008)   Lipid panel action/deferral: LDL Direct ordered   LDL: 77  (08/20/2008)   LDL Direct: 75  (02/21/2010)   HDL: 69  (08/20/2008)   Triglycerides: 57  (08/20/2008)   Lipid panel due: 03/24/2010    SGOT (AST): 13  (05/13/2010)   SGPT (ALT): 13  (05/13/2010)   Alkaline phosphatase: 98  (05/13/2010)   Total bilirubin: 0.3  (05/13/2010)    Lipid flowsheet reviewed?: Yes   Progress toward LDL goal: At goal  Hypertension   Last Blood Pressure: 135 / 85  (05/16/2010)   Serum creatinine: 0.52  (05/13/2010)   Serum potassium 3.9  (05/13/2010)   Basic metabolic panel due: 03/24/2010    Hypertension flowsheet reviewed?: Yes   Progress toward BP goal: Improved  Self-Management Support :   Personal Goals (by the next clinic visit) :     Personal A1C goal: 8  (04/22/2009)     Personal blood pressure goal: 130/80  (04/22/2009)     Personal LDL goal: 100  (04/22/2009)    Diabetes self-management support: CBG self-monitoring log, Written self-care plan  (10/12/2009)    Diabetes self-management support not done because: Good outcomes  (04/22/2009)    Hypertension self-management support: Written self-care plan  (04/15/2010)    Hypertension self-management support not done because: Good outcomes  (10/29/2009)    Lipid self-management support: Not documented  Lipid  self-management support not done because: Good outcomes  (10/29/2009)

## 2010-08-02 NOTE — Miscellaneous (Signed)
Summary: high cbgs  Clinical Lists Changes walked in c/o blurrry vision & dizziness. appt made & interpretor arranged. after work last night the meter read HI. she took it again on her family's advice  & it was 589. ems called & it had gone down to 485. States she ate a hotdog last night & oatmeal this am.. rthinks this is from her new med. states her sugars used to be low until this med, now they are always high.Marland KitchenMarland KitchenGolden Circle RN  September 09, 2009 9:37 AM

## 2010-08-02 NOTE — Letter (Signed)
Summary: Generic Letter  [facility]  [address]   [place], [place] [address]   Phone: [phone]  Fax: [phone]    03/10/2010  CACHET MCCUTCHEN [address] [place], [place]  [address]  Dear Ms. SCHROECK,  It was nice meeting you in clinic last month. Please schedule an appointment in clinic during the next 1-2 weeks so that we may talk about your medications and any other concerns you may have.    Sincerely,   Lloyd Huger MD

## 2010-08-02 NOTE — Progress Notes (Signed)
Summary: high blood glucose   Phone Note Call from Patient   Summary of Call: Blood glucosein 400's. Has not missed any doses of her insulin. Just took her evening dose of Novolog after seeing CBG was that high. Reports that her insulin is not expired. Denies chest pain, dyspnea, fevers, vomiting, weakness, numbness, lethargy. CBGs run from 70 to 200. EMS was called to home for high CBG, family was told "everything was ok". Advised to make sure patient stays well hydrated. Reviewed red flags that would prompt patient to return to care, advised to come in in AM to be evaulated and have blood glucose regimen re-evaluated.  Initial call taken by: Bobby Rumpf  MD,  September 08, 2009 8:36 PM     Appended Document: high blood glucose  pt showed up at 8:30 to be seen. will be placed in schedule when I can get a sign language interpretor

## 2010-08-02 NOTE — Assessment & Plan Note (Signed)
Summary: ingrown toenail   Vital Signs:  Patient profile:   55 year old female Weight:      177.6 pounds Temp:     98.2 degrees F oral Pulse rate:   88 / minute Pulse rhythm:   regular BP sitting:   130 / 62  (right arm) Cuff size:   regular  Vitals Entered By: Loralee Pacas CMA (June 23, 2009 10:25 AM)  Primary Care Provider:  Asher Muir MD  CC:  toe pain.  History of Present Illness: 55yo mute F w/ acute toe pain  Toe pain: L second toe surrounding the nail x 2 days.  States that the pain is now 1-2/10.  Has had some swelling and discoloration of the toe.  Denies any trauma, cuts, or insect bites.  Denies any fevers, pus, or drainage.  Able to amublate and bear weight.    Allergies: 1)  ! Sulfa 2)  ! * Altace 3)  Sulfamethoxazole (Sulfamethoxazole)  Review of Systems       Denies any trauma, cuts, or insect bites.  Denies any fevers, pus, or drainage.  Physical Exam  General:  VS reviewed.  Mute.  Well appearing, NAD.  Translator present. Neurologic:  L second toe: Inspection- compared to the right, definitely mildly swollen and discolored (darker); nail is cut short, curved inward with ingrown appearance; drainage or pus; no laceration Palpation- no ttp of entire toe, metatarsal, or nail bed; warm digit; brisk cap refills; no fluctuance ROM- full ROM of all joints both active and passive   Impression & Recommendations:  Problem # 1:  INGROWN TOENAIL (ICD-703.0) Assessment New  Symptoms seem to be more due to ingrown toenail.  Most of her toenails appear this way.  Could be early signs of paronychia.  Will treat as though it is early infection.  Warm compresses and topical bacitracin.  If she develops fever, pus, drainage, worsening swelling and pain, will have her return for reassessment and possible oral antibiotics.  Discussed strategies to fix the ingrown toenails.  Also offered to refer to podiatry to help trim her toenails.  I decided not to obtain  imaging despite the obvious swelling and discoloration b/c of no previous trauma and lack of pain upon palpation.  Will f/u with Dr. Lafonda Mosses.  Orders: [facility]- Est Level  3 ([address])  Complete Medication List: 1)  Amlodipine Besylate 10 Mg Tabs (Amlodipine besylate) .... Take 1 tablet by mouth once a day 2)  Bayer Childrens Aspirin 81 Mg Chew (Aspirin) .... Take 1 tablet by mouth once a day 3)  Glucophage 1000 Mg Tabs (Metformin hcl) .Marland Kitchen.. 1 tablet by mouth twice a day 4)  Metoclopramide Hcl 5 Mg Tabs (Metoclopramide hcl) .... Take 1 tablet by mouth as directed 5)  Metoprolol Tartrate 100 Mg Tabs (Metoprolol tartrate) .Marland Kitchen.. 1 by mouth two times a day 6)  Neurontin 300 Mg Caps (Gabapentin) .Marland Kitchen.. 1 pill 4x/day 7)  Lantus 100 Unit/ml Soln (Insulin glargine) .... Inject 20 units daily.  dispense qs x 37month 8)  Lisinopril-hydrochlorothiazide 10-12.5 Mg Tabs (Lisinopril-hydrochlorothiazide) .... One tab by mouth daily 9)  Miralax Powd (Polyethylene glycol 3350) .Marland Kitchen.. 17g by mouth once daily as needed for constipation.  mix w/ 8 oz of water.  disp 1 large container 10)  Fluticasone Propionate 50 Mcg/act Susp (Fluticasone propionate) .... 2 sprays each nostril once daily 11)  Simvastatin 80 Mg Tabs (Simvastatin) .Marland Kitchen.. 1 by mouth at bedtime 12)  Ranitidine Hcl 150 Mg Caps (Ranitidine hcl) .... Two  times a day. 13)  Novolog 100 Unit/ml Soln (Insulin aspart) .... 5 units subcutaneously before each meal.  if pre-meal blood sugar is <110, do not take the novolog at that meal; dispense qs for 1 month 14)  Bacitracin 500 Unit/gm Oint (Bacitracin) .... Apply to affected area three times a day until symptoms resolve disp: large tube  Patient Instructions: 1)  Follow up with Dr. Lafonda Mosses if symptoms do not improve. 2)  I sent a prescription for bacitracin to the pharmacy for you.  Apply it three times a day. 3)  You can do the interventions we discussed to help lift the edges of the nail. 4)  You may have the  beginnings of an infection...apply the medication prescribed and apply warm compresses up to 6 times a day.  If you develop fever, worsening pain and swelling, please return to be reassessed. 5)  Tylenol or motrin is appropriate for the pain. Prescriptions: BACITRACIN 500 UNIT/GM OINT (BACITRACIN) apply to affected area three times a day until symptoms resolve Disp: large tube  #1 x 1   Entered and Authorized by:   Marisue Ivan  MD   Signed by:   Marisue Ivan  MD on 06/23/2009   Method used:   [facility]        [id number]       [address]       [place], [place]  [address]       Ph: [phone] or [phone]       Fax: [phone]   RxID:   [id number]

## 2010-08-02 NOTE — Assessment & Plan Note (Signed)
Summary: follow up daibetes, elevated BS readings/ls   Vital Signs:  Patient profile:   55 year old female Weight:      168.2 pounds Temp:     97.7 degrees F oral Pulse rate:   78 / minute Pulse rhythm:   regular BP sitting:   136 / 79  (left arm) Cuff size:   regular  Vitals Entered By: Loralee Pacas CMA (October 29, 2009 9:22 AM)   Primary Care Provider:  Asher Muir MD  CC:  elevated sugars.  History of Present Illness: 1.  sugars--seen in pharm clinic recenly for diabetes.  changed to lantus to 9 units two times a day and nov sliding scale  with each meal.  then earlier this week called outside line to report blood sugars 400-500 in hte morning and around 100 at night.  at that time, I changed increased her evening lantus to 10 units.  on review of her cbg log today, she has many high morning cbgs wih a range of 165-380.  had one low of 38 during the night.  had one pre-lunch cbg of 60.    Current Medications (verified): 1)  Amlodipine Besylate 10 Mg Tabs (Amlodipine Besylate) .... Take 1 Tablet By Mouth Once A Day 2)  Bayer Childrens Aspirin 81 Mg Chew (Aspirin) .... Take 1 Tablet By Mouth Once A Day 3)  Glucophage 1000 Mg Tabs (Metformin Hcl) .Marland Kitchen.. 1 Tablet By Mouth Twice A Day 4)  Metoprolol Tartrate 100 Mg Tabs (Metoprolol Tartrate) .Marland Kitchen.. 1 By Mouth Two Times A Day 5)  Neurontin 300 Mg  Caps (Gabapentin) .Marland Kitchen.. 1 Pill 4x/day 6)  Lantus 100 Unit/ml Soln (Insulin Glargine) .... Inject 9 Units in The Am and 10 Units in The Pm.   Prior To Breakfast and Prior To Evenng Meal.   Dispense Qs X 59month 7)  Miralax   Powd (Polyethylene Glycol 3350) .Marland Kitchen.. 17g By Mouth Once Daily As Needed For Constipation.  Mix W/ 8 Oz of Water.  Disp 1 Large Container 8)  Fluticasone Propionate 50 Mcg/act  Susp (Fluticasone Propionate) .... 2 Sprays Each Nostril Once Daily 9)  Simvastatin 80 Mg Tabs (Simvastatin) .Marland Kitchen.. 1 By Mouth At Bedtime 10)  Ranitidine Hcl 150 Mg Caps (Ranitidine Hcl) .... Two Times A  Day. 11)  Novolog 100 Unit/ml Soln (Insulin Aspart) .... Sliding Scale - Zero For Bg<100, 4 Units For 100-150, 6 Units For 151-250, 8 Units For >250.  3 Times Daily Prior . Dispense 2 Vials Per Month. 12)  Triamcinolone Acetonide 0.1 % Oint (Triamcinolone Acetonide) .... Apply To Abdomen Once or Twice A Day.  Dispense One Large Tube 13)  Hydrochlorothiazide 25 Mg Tabs (Hydrochlorothiazide) .... 1/2 Tablets Daily 14)  Bd Insulin Syringe Ultrafine 30g X 1/2" 1 Ml Misc (Insulin Syringe-Needle U-100) .... Use For 5 Insulin Injections Daily.  Dispense One Month Supply.  Refill Prn 15)  Mupirocin 2 % Oint (Mupirocin) .... Apply To Affected Area - Under Right Breast. Three Times Daily Then As Needed.  Dispense 30 Gram Tube. 16)  Losartan Potassium 50 Mg Tabs (Losartan Potassium) .... Once Daily in The Morning.  For Blood Pressure.  Allergies: 1)  ! Sulfa 2)  ! * Altace  Physical Exam  General:  Well-developed,well-nourished,in no acute distress; alert,appropriate and cooperative throughout examination Psych:  Oriented X3 and memory intact for recent and remote.   Additional Exam:  vital signs reviewed    Impression & Recommendations:  Problem # 1:  DIABETES MELLITUS, II, COMPLICATIONS (  ICD-250.92) Assessment Unchanged A1C is good at 7.1.  And she is having fewer during-the-night lows.  however, she is now having high am sugars.  will increase sliding scale just slightly.  rtc 2 weeks.  pt agress with plan Her updated medication list for this problem includes:    Bayer Childrens Aspirin 81 Mg Chew (Aspirin) .Marland Kitchen... Take 1 tablet by mouth once a day    Glucophage 1000 Mg Tabs (Metformin hcl) .Marland Kitchen... 1 tablet by mouth twice a day    Lantus 100 Unit/ml Soln (Insulin glargine) ..... Inject 9 units in the am and 10 units in the pm.   prior to breakfast and prior to evenng meal.   dispense qs x 58month    Novolog 100 Unit/ml Soln (Insulin aspart) ..... Sliding scale - zero for bg<100, 4 units for 100-150, 6  units for 151-250, 8 units for >250.  3 times daily prior . dispense 2 vials per month.    Losartan Potassium 50 Mg Tabs (Losartan potassium) ..... Once daily in the morning.  for blood pressure.  Orders: A1C-[facility] ([address]) [facility]- Est Level  3 ([address])  Complete Medication List: 1)  Amlodipine Besylate 10 Mg Tabs (Amlodipine besylate) .... Take 1 tablet by mouth once a day 2)  Bayer Childrens Aspirin 81 Mg Chew (Aspirin) .... Take 1 tablet by mouth once a day 3)  Glucophage 1000 Mg Tabs (Metformin hcl) .Marland Kitchen.. 1 tablet by mouth twice a day 4)  Metoprolol Tartrate 100 Mg Tabs (Metoprolol tartrate) .Marland Kitchen.. 1 by mouth two times a day 5)  Neurontin 300 Mg Caps (Gabapentin) .Marland Kitchen.. 1 pill 4x/day 6)  Lantus 100 Unit/ml Soln (Insulin glargine) .... Inject 9 units in the am and 10 units in the pm.   prior to breakfast and prior to evenng meal.   dispense qs x 58month 7)  Miralax Powd (Polyethylene glycol 3350) .Marland Kitchen.. 17g by mouth once daily as needed for constipation.  mix w/ 8 oz of water.  disp 1 large container 8)  Fluticasone Propionate 50 Mcg/act Susp (Fluticasone propionate) .... 2 sprays each nostril once daily 9)  Simvastatin 80 Mg Tabs (Simvastatin) .Marland Kitchen.. 1 by mouth at bedtime 10)  Ranitidine Hcl 150 Mg Caps (Ranitidine hcl) .... Two times a day. 11)  Novolog 100 Unit/ml Soln (Insulin aspart) .... Sliding scale - zero for bg<100, 4 units for 100-150, 6 units for 151-250, 8 units for >250.  3 times daily prior . dispense 2 vials per month. 12)  Triamcinolone Acetonide 0.1 % Oint (Triamcinolone acetonide) .... Apply to abdomen once or twice a day.  dispense one large tube 13)  Hydrochlorothiazide 25 Mg Tabs (Hydrochlorothiazide) .... 1/2 tablets daily 14)  Bd Insulin Syringe Ultrafine 30g X 1/2" 1 Ml Misc (Insulin syringe-needle u-100) .... Use for 5 insulin injections daily.  dispense one month supply.  refill prn 15)  Mupirocin 2 % Oint (Mupirocin) .... Apply to affected area - under right breast. three times  daily then as needed.  dispense 30 gram tube. 16)  Losartan Potassium 50 Mg Tabs (Losartan potassium) .... Once daily in the morning.  for blood pressure.  Patient Instructions: 1)  It was nice to see you today. 2)  Keep taking lantus 9 units in the morning and 10 units at night. 3)  CHANGE your sliding scale to  Zero for BG<100, 4 units for 100-150, 6 units for 151-250, 8 units for >250.  3 times daily prior . Dispense 2 vials per month. 4)  Try eating  a small snack between breakfast and lunch. 5)  Please schedule a follow-up appointment for diabetes  in 2 weeks.   Laboratory Results   Blood Tests   Date/Time Received: October 29, 2009 9:29 AM  Date/Time Reported: October 29, 2009 9:44 AM   HGBA1C: 7.1%   (Normal Range: Non-Diabetic - 3-6%   Control Diabetic - 6-8%)  Comments: ...........test performed by...........Marland KitchenTerese Door, CMA      Prevention & Chronic Care Immunizations   Influenza vaccine: Fluvax 3+  (07/16/2009)   Influenza vaccine due: 06/13/2008    Tetanus booster: 12/06/2003: Done.   Tetanus booster due: 12/05/2013    Pneumococcal vaccine: Done.  (04/03/2003)   Pneumococcal vaccine due: None  Colorectal Screening   Hemoccult: Not documented   Hemoccult due: Not Indicated    Colonoscopy: Results: Internal Hemorrhoids.     Location:  [facility].     (01/31/2008)   Colonoscopy due: 01/30/2018  Other Screening   Pap smear: NEGATIVE FOR INTRAEPITHELIAL LESIONS OR MALIGNANCY.  (10/16/2008)   Pap smear due: 12/10/2008    Mammogram: No specific mammographic evidence of malignancy.    (10/12/2009)   Mammogram action/deferral: Screening mammogram in 1 year.     (10/08/2006)   Mammogram due: 10/2010   Smoking status: never  (08/27/2009)  Diabetes Mellitus   HgbA1C: 7.1  (10/29/2009)   Hemoglobin A1C due: 03/12/2008    Eye exam: diabetic retinopathy  (01/01/2007)   Eye exam due: 01/01/2008    Foot exam: yes  (10/16/2008)   High risk foot: Not  documented   Foot care education: Not documented   Foot exam due: 02/06/2009    Urine microalbumin/creatinine ratio: Not documented   Urine microalbumin/cr due: 10/15/2007    Diabetes flowsheet reviewed?: Yes   Progress toward A1C goal: At goal  Lipids   Total Cholesterol: 157  (08/20/2008)   LDL: 77  (08/20/2008)   LDL Direct: 96  (12/11/2007)   HDL: 69  (08/20/2008)   Triglycerides: 57  (08/20/2008)    SGOT (AST): 14  (04/22/2009)   SGPT (ALT): 14  (04/22/2009)   Alkaline phosphatase: 93  (04/22/2009)   Total bilirubin: 0.3  (04/22/2009)    Lipid flowsheet reviewed?: Yes   Progress toward LDL goal: At goal  Hypertension   Last Blood Pressure: 136 / 79  (10/29/2009)   Serum creatinine: 0.55  (04/22/2009)   Serum potassium 3.6  (04/22/2009)    Hypertension flowsheet reviewed?: Yes   Progress toward BP goal: At goal  Self-Management Support :   Personal Goals (by the next clinic visit) :     Personal A1C goal: 8  (04/22/2009)     Personal blood pressure goal: 130/80  (04/22/2009)     Personal LDL goal: 100  (04/22/2009)    Diabetes self-management support: CBG self-monitoring log, Written self-care plan  (10/12/2009)    Diabetes self-management support not done because: Good outcomes  (04/22/2009)    Hypertension self-management support: Written self-care plan  (10/12/2009)    Hypertension self-management support not done because: Good outcomes  (10/29/2009)    Lipid self-management support: Not documented     Lipid self-management support not done because: Good outcomes  (10/29/2009)

## 2010-08-02 NOTE — Miscellaneous (Signed)
Summary: metoprolol change  Clinical Lists Changes  Medications: Changed medication from METOPROLOL SUCCINATE 200 MG TB24 (METOPROLOL SUCCINATE) Take 1 tablet by mouth once a day to METOPROLOL TARTRATE 100 MG TABS (METOPROLOL TARTRATE) 1 by mouth two times a day - Signed Rx of METOPROLOL TARTRATE 100 MG TABS (METOPROLOL TARTRATE) 1 by mouth two times a day;  #60 x 3;  Signed;  Entered by: Benn Moulder MD;  Authorized by: Benn Moulder MD;  Method used: Electronic    Prescriptions: METOPROLOL TARTRATE 100 MG TABS (METOPROLOL TARTRATE) 1 by mouth two times a day  #60 x 3   Entered and Authorized by:   Benn Moulder MD   Signed by:   Benn Moulder MD on 09/23/2007   Method used:   [facility] to ...       [facility]  [id number]       [address]       [place], [place]  [address]       Ph: [phone] or [phone]       Fax: [phone]   RxID:   [id number]

## 2010-08-02 NOTE — Assessment & Plan Note (Signed)
Summary: dm check up/eo   Vital Signs:  Patient Profile:   55 Years Old Female Height:     63 inches Weight:      179.8 pounds Temp:     98.5 degrees F Pulse rate:   78 / minute BP sitting:   127 / 67  (left arm)  Pt. in pain?   no  Vitals Entered By: Alphia Kava (February 07, 2008 8:51 AM)              Is Patient Diabetic? Yes      PCP:  Neena Rhymes  MD  Chief Complaint:  dm.  History of Present Illness: 55 yo woman w/ DM who RTC today for 1) DM- too early for A1C.  Pt states sugars have been running 60-200 usually w/ some very low CBGs overnight and in the early AM (30s-40s).  Also had severe lows when pt was fasting for colonoscopy.  At last visit pt's Glipizide was increased to 10mg - prior to this no reported lows.  Denies CP, SOB, HA, visual changes, edema.  2) HTN- BP is well controlled.  Pt is taking meds w/out difficulty.  No sxs as reported above.  3) Bilateral arm/shoulder pain- pt states PT did not help.  Cancelled remaining appts.  Went 2x/wk for 4 weeks.  Pain in R>L, still reports weakness.    Current Allergies: ! SULFA ! * ALTACE SULFAMETHOXAZOLE (SULFAMETHOXAZOLE)    Risk Factors:  PAP Smear History:     Date of Last PAP Smear:  12/11/2007    Results:   Specimen Adequacy: Satisfactory for evaluation.   Interpretation/Result:Negative for intraepithelial Lesion or Malignancy.       Pap Smear  Procedure date:  12/11/2007  Findings:       Specimen Adequacy: Satisfactory for evaluation.   Interpretation/Result:Negative for intraepithelial Lesion or Malignancy.     Comments:      Repeat Pap in 1 year.      Review of Systems      See HPI   Physical Exam  General:     Well-developed,well-nourished,in no acute distress; alert,appropriate and cooperative throughout examination Lungs:     Normal respiratory effort, chest expands symmetrically. Lungs are CTAB. Heart:     Normal rate and regular rhythm. S1 and S2 normal without  gallop, murmur, click, rub or other extra sounds. Abdomen:     Bowel sounds positive,abdomen soft and non-tender without masses, organomegaly or hernias noted. Pulses:     +2 DP and radial pulses Extremities:     No clubbing, cyanosis, edema, or deformity noted   Diabetes Management Exam:    Foot Exam (with socks and/or shoes not present):       Sensory-Pinprick/Light touch:          Left medial foot (L-4): normal          Left dorsal foot (L-5): normal          Left lateral foot (S-1): normal          Right medial foot (L-4): normal          Right dorsal foot (L-5): normal          Right lateral foot (S-1): normal       Sensory-Monofilament:          Left foot: normal          Right foot: normal       Inspection:          Left  foot: normal          Right foot: normal    Impression & Recommendations:  Problem # 1:  SHOULDER, PAIN (ICD-719.41) Assessment: Unchanged Pt has failed tx w/ NSAIDs, did not improve w/ PT.  Will refer to ortho for evaluation and tx b/c this has been ongoing for months.  Pt expresses understanding and is in agreement w/ this plan. Her updated medication list for this problem includes:    Bayer Childrens Aspirin 81 Mg Chew (Aspirin) .Marland Kitchen... Take 1 tablet by mouth once a day  Orders: [facility]- Est  Level 4 ([address]) Orthopedic Referral (Ortho)   Problem # 2:  HYPERTENSION, BENIGN SYSTEMIC (ICD-401.1) Assessment: Unchanged Pt's BP well controlled, no changes at this time. Her updated medication list for this problem includes:    Amlodipine Besylate 10 Mg Tabs (Amlodipine besylate) .Marland Kitchen... Take 1 tablet by mouth once a day    Metoprolol Tartrate 100 Mg Tabs (Metoprolol tartrate) .Marland Kitchen... 1 by mouth two times a day    Lisinopril-hydrochlorothiazide 10-12.5 Mg Tabs (Lisinopril-hydrochlorothiazide) ..... One tab by mouth daily  Orders: [facility]- Est  Level 4 ([address])   Problem # 3:  DIABETES MELLITUS, II, COMPLICATIONS (ICD-250.92) Assessment: Unchanged Will not  collect A1C today- too early.  Will decrease Lantus to 25 units nightly as pt is having very low CBGs overnight and early AM.  If pt continues to have lows will need to decrease Glipizide back to 5 mg.  Advised pt to call if continued lows- will need close f/u.  A1C at next visit. Her updated medication list for this problem includes:    Bayer Childrens Aspirin 81 Mg Chew (Aspirin) .Marland Kitchen... Take 1 tablet by mouth once a day    Glucophage 1000 Mg Tabs (Metformin hcl) .Marland Kitchen... 1 tablet by mouth twice a day    Lantus 100 Unit/ml Soln (Insulin glargine) ..... Inject 30 units daily.  dispense qs x 55month    Lisinopril-hydrochlorothiazide 10-12.5 Mg Tabs (Lisinopril-hydrochlorothiazide) ..... One tab by mouth daily    Glipizide Xl 10 Mg Tb24 (Glipizide) .Marland Kitchen... 1 tablet by mouth daily  Orders: [facility]- Est  Level 4 ([address])   Complete Medication List: 1)  Amlodipine Besylate 10 Mg Tabs (Amlodipine besylate) .... Take 1 tablet by mouth once a day 2)  Bayer Childrens Aspirin 81 Mg Chew (Aspirin) .... Take 1 tablet by mouth once a day 3)  Glucophage 1000 Mg Tabs (Metformin hcl) .Marland Kitchen.. 1 tablet by mouth twice a day 4)  Metoclopramide Hcl 5 Mg Tabs (Metoclopramide hcl) .... Take 1 tablet by mouth as directed 5)  Metoprolol Tartrate 100 Mg Tabs (Metoprolol tartrate) .Marland Kitchen.. 1 by mouth two times a day 6)  Neurontin 300 Mg Caps (Gabapentin) .Marland Kitchen.. 1 pill 4x/day 7)  Lantus 100 Unit/ml Soln (Insulin glargine) .... Inject 30 units daily.  dispense qs x 55month 8)  Lisinopril-hydrochlorothiazide 10-12.5 Mg Tabs (Lisinopril-hydrochlorothiazide) .... One tab by mouth daily 9)  Miralax Powd (Polyethylene glycol 3350) .Marland Kitchen.. 17g by mouth once daily as needed for constipation.  mix w/ 8 oz of water.  disp 1 large container 10)  Fluticasone Propionate 50 Mcg/act Susp (Fluticasone propionate) .... 2 sprays each nostril once daily 11)  Albuterol 90 Mcg/act Aers (Albuterol) .... 2 puffs q 4 hours prn 12)  Glipizide Xl 10 Mg Tb24 (Glipizide)  .Marland Kitchen.. 1 tablet by mouth daily 13)  Simvastatin 80 Mg Tabs (Simvastatin) .Marland Kitchen.. 1 by mouth at bedtime 14)  Ranitidine Hcl 150 Mg Caps (Ranitidine hcl) .... Two times a  day.   Patient Instructions: 1)  Please schedule a follow-up appointment in 1 month. 2)  Decrease the Lantus to 25 units and take this in the morning 3)  Continue your Metformin and the Glipizide 4)  Your blood pressure looks fantastic!  Keep up the good work! 5)  If your sugar is lower than 70, please call us!  You don't need to wait until your next appt 6)  You will get an appt with an Orthopedic Doctor to talk about your shoulder pain- hang in there!   ]

## 2010-08-02 NOTE — Progress Notes (Signed)
Summary: returning call  Phone Note Call from Patient Call back at [phone]   Reason for Call: Talk to Nurse Summary of Call: returning someone's call, pt is available tomorrow up to 11 am Initial call taken by: Knox Royalty,  December 12, 2007 4:35 PM  Follow-up for Phone Call        patient's daughter called stating she noticed a call on caller ID, but no message was left. will send message to MD. please call daughter Maebelle Munroe because mother is hearing impaired. she thinks maybe it was about her recent labs and visit. Follow-up by: Theresia Lo RN,  December 12, 2007 4:49 PM  Additional Follow-up for Phone Call Additional follow up Details #1::        Told pt will discuss lab results at next appt, not over the phone b/c I need time for lab and xray results to come back.  Phone call was likely a time and place for her GI referral for colonoscopy.  Will forward to my nurse Additional Follow-up by: Neena Rhymes  MD,  December 13, 2007 8:29 AM    Additional Follow-up for Phone Call Additional follow up Details #2::    pt's daughter notified Follow-up by: Alphia Kava,  December 13, 2007 9:13 AM

## 2010-08-02 NOTE — Letter (Signed)
Summary: Generic Letter  [facility]  [address]   [place], [place] [address]   Phone: [phone]  Fax: [phone]    10/20/2008  Alicia [name] [address] [place], [place]  [address]  Dear [name],  I just wanted to let you know that your pap smear was normal.  Please call me if you have any questions or concerns.          Sincerely,   Asher Muir MD  Appended Document: Generic Letter Mailed.

## 2010-08-02 NOTE — Letter (Signed)
Summary: Sugar Readings  Sugar Readings   Imported By: Clydell Hakim 05/14/2009 16:14:21  _____________________________________________________________________  External Attachment:    Type:   Image     Comment:   External Document

## 2010-08-02 NOTE — Assessment & Plan Note (Signed)
Summary: routine check up wp   Vital Signs:  Patient Profile:   55 Years Old Female Height:     63 inches Weight:      185.8 pounds BMI:     33.03 Temp:     97.7 degrees F Pulse rate:   73 / minute BP sitting:   150 / 84  (right arm)  Pt. in pain?   no  Vitals Entered By: Starleen Blue RN (March 15, 2007 1:45 PM)                Vision Comments: 01/2008   Chief Complaint:  routine vixit and Type 2 diabetes mellitus follow-up.  History of Present Illness:  Type 2 Diabetes Mellitus Follow-Up      This is a 55 year old woman who presents for Type 2 diabetes mellitus follow-up.  The patient reports numbness of extremities only when sugars are low, but denies polyuria, polydipsia, blurred vision, self managed hypoglycemia, weight loss, and weight gain.  The patient denies the following symptoms: chest pain, vomiting, poor wound healing, and vision loss.  Since the last visit the patient reports compliance with medications, not exercising regularly, and monitoring blood glucose.  The patient has been measuring capillary blood glucose before breakfast, before lunch, before dinner, and at bedtime.  Since the last visit, the patient reports having had eye care by an ophthalmologist and laser photocoagulation.  Complications from diabetes include peripheral neuropathy- but upon further questioning pt relates neuropathic sxs only when CBGs are low (numbness and tingling).    Hypertension Follow-Up      The patient also presents for Hypertension follow-up.  The patient denies lightheadedness, headaches, edema, and rash.  The patient denies the following associated symptoms: chest pain, chest pressure, and dyspnea.  Compliance with medications (by patient report) has been near 100% until she ran out of her medication 1 week ago.  She did not call the office knowing she had her appt today.  Prior to running out of her meds the pt's BP was well controlled.  The patient reports that dietary  compliance has been fair.  The patient reports exercising occasionally.    Hyperlipidemia Follow-Up      The patient also presents for Hyperlipidemia follow-up.  The patient denies muscle aches, GI upset, abdominal pain, constipation, and diarrhea.  Compliance with medications (by patient report) has been near 100%.  Dietary compliance has been fair.  LDL is 102 w/ pt on Simvastatin 20mg .  Current Allergies: ! SULFA ! * ALTACE SULFAMETHOXAZOLE (SULFAMETHOXAZOLE)  Past Medical History:    Reviewed history from 12/07/2006 and no changes required:        5/06 [name]- 5.8cm ant body fibroid s/p embolization,        anisocoria since childhood,        Bilat Deafness- 55yo meningitis,        diabetic gastroparesis, diabetic retinopathy, has had ha assessments, including MRI- unrevealing  Past Surgical History:    12/06 Uterine fibroids embolization - 10/12/2005    2-D Echo - nl 5/04 due to murmur - 11/20/2002    Appendectomy     cardiolite-EF77%no ischemia - 04/06/2005    cesarian section x2     ECG NSR, NAD, no ST/TW changes - 12/14/2003    ETT -intermediate Risk significant CAD-McDiarmid - 02/27/2005    Tubal ligation    Family History:    HTN mother, +ovarian CA grandmother, mother and sister have uterine fibroids    no CAD/MI/DM, sudden  death, no hypercholesterolemia  Social History:    Reviewed history from 08/30/2006 and no changes required:       married lives w/ husband and three children; Husband is deaf also, children are not; Works in day care since 2000; No tobacco, EtOH, rec drugs   Risk Factors:  Tobacco use:  never   Review of Systems      See HPI   Physical Exam  General:     Well-developed,well-nourished,in no acute distress; alert,appropriate and cooperative throughout examination Head:     Normocephalic and atraumatic without obvious abnormalities. No apparent alopecia or balding. Eyes:     pupils round, pupils reactive to light, no optic disk abnormalities, no  retinal abnormalitiies, and anisocoria.   Lungs:     Normal respiratory effort, chest expands symmetrically. Lungs are clear to auscultation, no crackles or wheezes. Heart:     Normal rate and regular rhythm. S1 and S2 normal without gallop, murmur, click, rub or other extra sounds. Abdomen:     Bowel sounds positive,abdomen soft and non-tender without masses, organomegaly or hernias noted. Pulses:     +2 DP and radial pulses Extremities:     No clubbing, cyanosis, edema, or deformity noted with normal full range of motion of all joints.    Diabetes Management Exam:    Foot Exam (with socks and/or shoes not present):       Sensory-Pinprick/Light touch:          Left medial foot (L-4): normal          Left dorsal foot (L-5): normal          Left lateral foot (S-1): normal          Right medial foot (L-4): normal          Right dorsal foot (L-5): normal          Right lateral foot (S-1): normal       Sensory-Monofilament:          Left foot: normal          Right foot: normal       Inspection:          Left foot: normal          Right foot: normal       Nails:          Left foot: normal          Right foot: normal    Eye Exam:       Eye Exam done elsewhere          Date: 01/01/2007          Results: diabetic retinopathy    Impression & Recommendations:  Problem # 1:  DIABETES MELLITUS, II, COMPLICATIONS (ICD-250.92) Assessment: Unchanged Pt's A1C stable at 7.2.  No medication changes at today's visit.  Recent CMP WNL- no indication of renal insufficiency.  At next visit will get microalbuminuria.  Pt UTD on eye care.  No evidence of neuropathy on foot exam.  Pt w/out question or concern.  In agreement w/ current regimen. Her updated medication list for this problem includes:    Bayer Childrens Aspirin 81 Mg Chew (Aspirin) .Marland Kitchen... Take 1 tablet by mouth once a day    Glipizide 5 Mg Tb24 (Glipizide) .Marland Kitchen... Take 1 tablet by mouth once a day    Glucophage 1000 Mg Tabs (Metformin  hcl) .Marland Kitchen... 1 tablet by mouth twice a day    Lantus 100 Unit/ml Soln (Insulin  glargine) ..... Inject 30 units daily.  dispense qs x 43month  Orders: [facility]- Est  Level 4 (59563)   Problem # 2:  HYPERTENSION, BENIGN SYSTEMIC (ICD-401.1) Assessment: Deteriorated Pt off at least 1 med for last week.  Provided refills and asked pt to call prior to running out of meds.  Pt's BP at prior visit was 120s.  Asked pt to check BP at grocery store or pharmacy and call if >140/90.  At this time, will not make medication changes.  Advised pt that if BP remains elevated will need to make slight changes to regimen.  Pt in agreement w/ this plan and expresses understanding of instructions. Her updated medication list for this problem includes:    Amlodipine Besylate 10 Mg Tabs (Amlodipine besylate) .Marland Kitchen... Take 1 tablet by mouth once a day    Metoprolol Succinate 200 Mg Tb24 (Metoprolol succinate) .Marland Kitchen... Take 1 tablet by mouth once a day    Hydrochlorothiazide 12.5 Mg Tabs (Hydrochlorothiazide) .Marland Kitchen... 1 tab by mouth daily  Orders: [facility]- Est  Level 4 (99214)   Problem # 3:  HYPERCHOLESTEROLEMIA (ICD-272.0) Assessment: Unchanged Pt's LDL 102.  Goal for diabetics is 70.  Due to this will increase Simvastatin to 40mg .  Pt had normal CMP, will recheck LFT's at next visit.  Pt in agreement w/ this plan. The following medications were removed from the medication list:    Simvastatin 20 Mg Tabs (Simvastatin) .Marland Kitchen... Take 1 tablet by mouth at bedtime  Her updated medication list for this problem includes:    Simvastatin 40 Mg Tabs (Simvastatin) .Marland Kitchen... 1 tab by mouth at bedtime.  Orders: [facility]- Est  Level 4 (87564)   Complete Medication List: 1)  Amlodipine Besylate 10 Mg Tabs (Amlodipine besylate) .... Take 1 tablet by mouth once a day 2)  Bayer Childrens Aspirin 81 Mg Chew (Aspirin) .... Take 1 tablet by mouth once a day 3)  Glipizide 5 Mg Tb24 (Glipizide) .... Take 1 tablet by mouth once a day 4)  Glucophage 1000 Mg  Tabs (Metformin hcl) .Marland Kitchen.. 1 tablet by mouth twice a day 5)  Metoclopramide Hcl 5 Mg Tabs (Metoclopramide hcl) .... Take 1 tablet by mouth as directed 6)  Metoprolol Succinate 200 Mg Tb24 (Metoprolol succinate) .... Take 1 tablet by mouth once a day 7)  Voltaren 75 Mg Tbec (Diclofenac sodium) .... Take 1 tablet by mouth twice a day 8)  Neurontin 300 Mg Caps (Gabapentin) .Marland Kitchen.. 1 pill 4x/day 9)  Lantus 100 Unit/ml Soln (Insulin glargine) .... Inject 30 units daily.  dispense qs x 43month 10)  Hydrochlorothiazide 12.5 Mg Tabs (Hydrochlorothiazide) .Marland Kitchen.. 1 tab by mouth daily 11)  Simvastatin 40 Mg Tabs (Simvastatin) .Marland Kitchen.. 1 tab by mouth at bedtime.   Patient Instructions: 1)  Please schedule a follow-up appointment in 3 months. 2)  Continue to take all your medicines as directed- if you need refills, please call 3)  We increased your Simvastatin to 40mg - to finish up what you have at home, take 2 pills until you pick up your new prescription and then take 1 4)  Continue the great work with your diabetes!  Your A1C is great!    Prescriptions: SIMVASTATIN 40 MG  TABS (SIMVASTATIN) 1 tab by mouth at bedtime.  #30 x 3   Entered and Authorized by:   Neena Rhymes  MD   Signed by:   Neena Rhymes  MD on 03/15/2007   Method used:   Electronically sent to [id number] [address]       [address]       [place], [place]  [address]       Ph: [phone] or [phone]       Fax: [phone]   RxID:   [id number] AMLODIPINE BESYLATE 10 MG TABS (AMLODIPINE BESYLATE) Take 1 tablet by mouth once a day  #30 x 3   Entered and Authorized by:   Neena Rhymes  MD   Signed by:   Neena Rhymes  MD on 03/15/2007   Method used:   Electronically sent to ...       [id number] [address]       [address]       [place], [place]  [address]       Ph: [phone] or [phone]       Fax: [phone]   RxID:    [id number] METOPROLOL SUCCINATE 200 MG TB24 (METOPROLOL SUCCINATE) Take 1 tablet by mouth once a day  #30 x 3   Entered and Authorized by:   Neena Rhymes  MD   Signed by:   Neena Rhymes  MD on 03/15/2007   Method used:   Electronically sent to ...       [id number] [address]       [address]       [place], [place]  [address]       Ph: [phone] or [phone]       Fax: [phone]   RxID:   [id number] HYDROCHLOROTHIAZIDE 12.5 MG  TABS (HYDROCHLOROTHIAZIDE) 1 tab by mouth daily  #30 x 3   Entered and Authorized by:   Neena Rhymes  MD   Signed by:   Neena Rhymes  MD on 03/15/2007   Method used:   Electronically sent to ...       [id number] [address]       [address]       [place], [place]  [address]       Ph: [phone] or [phone]       Fax: [phone]   RxID:   [id number] LANTUS 100 UNIT/ML SOLN (INSULIN GLARGINE) inject 30 units daily.  Dispense QS x 27month  #1 x 3   Entered and Authorized by:   Neena Rhymes  MD   Signed by:   Neena Rhymes  MD on 03/15/2007   Method used:   Electronically sent to ...       [id number] [address]       [address]       [place], [place]  [address]       Ph: [phone] or [phone]       Fax: [phone]   RxID:   [id number] NEURONTIN 300 MG  CAPS (GABAPENTIN) 1 pill 4x/day  #120 x 3   Entered and Authorized by:   Neena Rhymes  MD   Signed by:   Neena Rhymes  MD on 03/15/2007   Method used:   Electronically sent to ...       [id number] [address]       [address]       [place], [place]  [address]       Ph: [phone]  or [phone]       Fax: [phone]   RxID:   [id number]  ]

## 2010-08-02 NOTE — Miscellaneous (Signed)
Summary: lancets needed  Clinical Lists Changes using relay she stated she needs test strips for testing. uses [facility] also wants md to know she tests 4 times a day.  The form is in pcp chart box. when she signs, plz call pt & let her know it has been done.Golden Circle RN  May 07, 2009 10:29 AM  form completed and placed in fax pile, but done after 5pm; so it will be monday before they receive.

## 2010-08-02 NOTE — Assessment & Plan Note (Signed)
Summary: F/U LABS/[facility]   Vital Signs:  Patient Profile:   55 Years Old Female Height:     63 inches Weight:      184.0 pounds Temp:     98.6 degrees F Pulse rate:   76 / minute BP sitting:   136 / 65  (left arm)  Pt. in pain?   no  Vitals Entered By: Alphia Kava (December 30, 2007 3:02 PM)              Is Patient Diabetic? Yes      Chief Complaint:  discuss labs.  History of Present Illness: 55 yo woman who RTC today to discuss labs and xrays regarding her shoulder pain 1) Shoulder Pain- mildly improved but not dramatically so.  Labs and Xrays all WNL.  Unlikely to be rheumatic process given normal ESR.  Little relief w/ ibuprofen.  Pt wants to know why she has pain.  2) Abd pain/bloating- pt describes a burning pain that starts near her umbilicus and travels upward.  Worse when lying down.  Somewhat relieved by burping.  Abd is nontender to touch.  Regular BMs.  3) Chest Wall Pain- pt continues to have pain along intercostal muscles on L side of chest and now R side as well.  Pt doesn't recall injury or mechanism for pain.  Denies SOB, N/V, diaphoresis, or 'internal pain'.    Current Allergies: ! SULFA ! * ALTACE SULFAMETHOXAZOLE (SULFAMETHOXAZOLE)     Review of Systems      See HPI   Physical Exam  General:     Well-developed,well-nourished,in no acute distress; alert,appropriate and cooperative throughout examination Head:     Normocephalic and atraumatic without obvious abnormalities. No apparent alopecia or balding. Chest Wall:     Intercostal muscles TTP on L and R Lungs:     Normal respiratory effort, chest expands symmetrically. Lungs are CTAB. Heart:     Normal rate and regular rhythm. S1 and S2 normal without gallop, murmur, click, rub or other extra sounds. Abdomen:     Bowel sounds positive,abdomen soft and non-tender without masses, organomegaly or hernias noted. Msk:     Pain w/ forward flexion of shoulders at 100 degrees.  Limited internal  rotation.  Pain w/ abduction at 90 degrees.  Upper arms TTP bilaterally.  Full ROM of hips and knees. Extremities:     No clubbing, cyanosis, edema, or deformity noted     Impression & Recommendations:  Problem # 1:  SHOULDER, PAIN (ICD-719.41) Assessment: Unchanged Labs and xrays WNL.  Pain somewhat improved but not dramatically so.  Will refer to PT for evaluation and treatment.  If PT provides no relief may need to recommend SSRI b/c I now have some concern for somatization given pt's multiple complaints encompassing multiple organ systems at each visit.  Will follow. The following medications were removed from the medication list:    Voltaren 75 Mg Tbec (Diclofenac sodium) .Marland Kitchen... Take 1 tablet by mouth twice a day  Her updated medication list for this problem includes:    Bayer Childrens Aspirin 81 Mg Chew (Aspirin) .Marland Kitchen... Take 1 tablet by mouth once a day  Orders: [facility]- Est  Level 4 (81191) Physical Therapy Referral (PT)   Problem # 2:  GERD (ICD-530.81) Assessment: New Pt describing reflux sxs.  Pt to take Ranitidine and see if this improves her complaints.  Will follow at future visits. Her updated medication list for this problem includes:    Ranitidine Hcl 150 Mg Caps (  Ranitidine hcl) .Marland Kitchen..Marland Kitchen Two times a day.  Orders: [facility]- Est  Level 4 (13086)   Problem # 3:  COSTOCHONDRITIS (ICD-733.6) Assessment: New Pt's chest wall TTP between ribs.  Pt doesn't recall injury or mechanism for pain.  Some relief w/ Ibuprofen.  Pt to continue Ibuprofen as needed and apply heat/ice for comfort.  Pt expresses understanding and is in agreement w/ this plan. Orders: [facility]- Est  Level 4 (57846)   Complete Medication List: 1)  Amlodipine Besylate 10 Mg Tabs (Amlodipine besylate) .... Take 1 tablet by mouth once a day 2)  Bayer Childrens Aspirin 81 Mg Chew (Aspirin) .... Take 1 tablet by mouth once a day 3)  Glucophage 1000 Mg Tabs (Metformin hcl) .Marland Kitchen.. 1 tablet by mouth twice a day 4)   Metoclopramide Hcl 5 Mg Tabs (Metoclopramide hcl) .... Take 1 tablet by mouth as directed 5)  Metoprolol Tartrate 100 Mg Tabs (Metoprolol tartrate) .Marland Kitchen.. 1 by mouth two times a day 6)  Neurontin 300 Mg Caps (Gabapentin) .Marland Kitchen.. 1 pill 4x/day 7)  Lantus 100 Unit/ml Soln (Insulin glargine) .... Inject 30 units daily.  dispense qs x 35month 8)  Lisinopril-hydrochlorothiazide 10-12.5 Mg Tabs (Lisinopril-hydrochlorothiazide) .... One tab by mouth daily 9)  Tessalon 200 Mg Caps (Benzonatate) .Marland Kitchen.. 1 by mouth three times a day as needed for cough 10)  Nystatin-triamcinolone 100000-0.1 Unit/gm-% Oint (Nystatin-triamcinolone) .... Apply to affected area 2-3x/day.  disp 30 gm tube 11)  Miralax Powd (Polyethylene glycol 3350) .Marland Kitchen.. 17g by mouth once daily as needed for constipation.  mix w/ 8 oz of water.  disp 1 large container 12)  Fluticasone Propionate 50 Mcg/act Susp (Fluticasone propionate) .... 2 sprays each nostril once daily 13)  Albuterol 90 Mcg/act Aers (Albuterol) .... 2 puffs q 4 hours prn 14)  Glipizide Xl 10 Mg Tb24 (Glipizide) .Marland Kitchen.. 1 tablet by mouth daily 15)  Simvastatin 80 Mg Tabs (Simvastatin) .Marland Kitchen.. 1 by mouth at bedtime 16)  Ranitidine Hcl 150 Mg Caps (Ranitidine hcl) .... Two times a day.   Patient Instructions: 1)  Please schedule a follow-up appointment in 4-6 weeks. 2)  Someone will contact you with your physical therapy appt 3)  Continue to take Ibuprofen 600mg  every 6-8 hours with food for pain 4)  Take the Ranitidine twice daily for the bloating and heartburn 5)  Take 2 Simvastatin nightly until you finish your current prescription- your new prescription is just 1 pill nightly b/c it was changed to 80mg . 6)  The good news is that we didn't find anything serious on your labs- so hang in there!   Prescriptions: RANITIDINE HCL 150 MG CAPS (RANITIDINE HCL) two times a day.  #60 x 3   Entered and Authorized by:   Neena Rhymes  MD   Signed by:   Neena Rhymes  MD on 12/30/2007    Method used:   Elect[facility] to ...       [id number]       [address]       [place], [place]  [address]       Ph: [phone] or [phone]       Fax: [phone]   RxID:   [id number] SIMVASTATIN 80 MG TABS (SIMVASTATIN) 1 by mouth at bedtime  #30 x 3   Entered and Authorized by:   Neena Rhymes  MD   Signed by:   Neena Rhymes  MD on 12/30/2007   Method used:  Electronically sent to ...       [facility]  [id number]       [address]       [place], [place]  [address]       Ph: [phone] or [phone]       Fax: [phone]   RxID:   [id number]  ]

## 2010-08-02 NOTE — Letter (Signed)
Summary: Generic Letter  [facility]  [address]   [place], [place] [address]   Phone: [phone]  Fax: [phone]    12/01/2009  Alicia [name] [address] [place], [place]  [address]  Dear Alicia [name],   I just wanted to let you know that your cholesterol was great!  Keep up the good work.  Please call me if you have any questions or concerns.           Sincerely,   Asher Muir MD  Appended Document: Generic Letter mailed  Appended Document: Generic Letter mailed.

## 2010-08-02 NOTE — Miscellaneous (Signed)
Summary: prevention  Clinical Lists Changes      Last PAP:  NEGATIVE FOR INTRAEPITHELIAL LESIONS OR MALIGNANCY. (10/16/2008 12:00:00 AM) PAP Result Date:  10/16/2008 PAP Next Due:  3 yr Last Diabetes Foot Check:  yes (10/16/2008 2:00:27 PM) Diabetic Foot Exam Date:  10/16/2008 Diabetes Foot Check Next Due:  1 yr

## 2010-08-02 NOTE — Miscellaneous (Signed)
Summary: Orders Update  Clinical Lists Changes  Orders: Added new Test order of Comp Met-[facility] [phone]) - Signed Added new Test order of A1C-[facility] ([id number]) - Signed

## 2010-08-02 NOTE — Assessment & Plan Note (Signed)
Summary: Diabetes - Rx Clinic   Vital Signs:  Patient profile:   55 year old female Weight:      175 pounds Pulse rate:   73 / minute BP sitting:   110 / 66  (right arm)  Primary Care Provider:  Asher Muir MD   History of Present Illness: Patient arrives accompanied by an interpreters.   Presents for DM follow-up for eratic glucose levels.  Monitors glucose 4x daily and reports adherence with therapy.  Patient is experiencing lows in the 50-60's at bedtime since starting Novolog 8 units before dinner 08/12/09.  She experiences hypoglycemic symptoms with lows and treats appropriately.  She does not use dinner insulin dose if glucose <110.  She has not been using Novolog 5 units prior to breakfast and lunch.  Patient eats 3x daily with occasional snacks.  Most meals include low glycemic index foods per patient.  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    Current Medications (verified): 1)  Amlodipine Besylate 10 Mg Tabs (Amlodipine Besylate) .... Take 1 Tablet By Mouth Once A Day 2)  Bayer Childrens Aspirin 81 Mg Chew (Aspirin) .... Take 1 Tablet By Mouth Once A Day 3)  Glucophage 1000 Mg Tabs (Metformin Hcl) .Marland Kitchen.. 1 Tablet By Mouth Twice A Day 4)  Metoprolol Tartrate 100 Mg Tabs (Metoprolol Tartrate) .Marland Kitchen.. 1 By Mouth Two Times A Day 5)  Neurontin 300 Mg  Caps (Gabapentin) .Marland Kitchen.. 1 Pill 4x/day 6)  Lantus 100 Unit/ml Soln (Insulin Glargine) .... Inject 22 Units Daily.  Dispense Qs X 67month 7)  Lisinopril-Hydrochlorothiazide 10-12.5 Mg  Tabs (Lisinopril-Hydrochlorothiazide) .... One Tab By Mouth Daily 8)  Miralax   Powd (Polyethylene Glycol 3350) .Marland Kitchen.. 17g By Mouth Once Daily As Needed For Constipation.  Mix W/ 8 Oz of Water.  Disp 1 Large Container 9)  Fluticasone Propionate 50 Mcg/act  Susp (Fluticasone Propionate) .... 2 Sprays Each Nostril Once Daily 10)  Simvastatin 80 Mg Tabs (Simvastatin) .Marland Kitchen.. 1 By Mouth At Bedtime 11)  Ranitidine Hcl 150 Mg Caps (Ranitidine Hcl)  .... Two Times A Day. 12)  Novolog 100 Unit/ml Soln (Insulin Aspart) .... 8 Units Sq Before Dinner.  If Pre-Meal Blood Sugar Is <110, Do Not Take The Novolog At That Meal; Dispense Qs For 1 Month 13)  Bacitracin 500 Unit/gm Oint (Bacitracin) .... Apply To Affected Area Three Times A Day Until Symptoms Resolve Disp: Large Tube 14)  Triamcinolone Acetonide 0.1 % Oint (Triamcinolone Acetonide) .... Apply To Rash On Leg Two Times A Day For 2 Weeks.  Dispense One Large Tube  Allergies (verified): 1)  ! Sulfa 2)  ! * Altace 3)  Sulfamethoxazole (Sulfamethoxazole)   Impression & Recommendations:  Problem # 1:  DIABETES MELLITUS, II, COMPLICATIONS (ICD-250.92) Assessment Improved  Patient is at A1c goal 7.0% as of 1/11.  Despite only using Novolog at bedtime, hyperglycemia has improved with addition of Novolog and increase in Lantus, however patient experiencing hypoglycemia at bedtime.  Diet and exercise are consistent.  Patient has NOT been taking novolog prior to breakfast or lunch.   Decrease Novolog to 6 units before dinner if glucose >110, continue Lantus and metformin, and check glucose 4x daily.  Patient will indicate on glucose logs when she administers Novolog with dinner.  FU with Rx Clinic in 2-3 weeks.  Total visit:  45 minutes. Patient seen with:  Elaina Pattee, PharmD Resident, Sheliah Mends, PharmD Candidate  Her updated medication list for this problem includes:  Bayer Childrens Aspirin 81 Mg Chew (Aspirin) .Marland Kitchen... Take 1 tablet by mouth once a day    Glucophage 1000 Mg Tabs (Metformin hcl) .Marland Kitchen... 1 tablet by mouth twice a day    Lantus 100 Unit/ml Soln (Insulin glargine) ..... Inject 22 units daily.  dispense qs x 45month    Lisinopril-hydrochlorothiazide 10-12.5 Mg Tabs (Lisinopril-hydrochlorothiazide) ..... One tab by mouth daily    Novolog 100 Unit/ml Soln (Insulin aspart) .Marland KitchenMarland KitchenMarland KitchenMarland Kitchen 6 units sq before dinner.  if pre-meal blood sugar is <110, do not take the novolog at that meal; dispense qs  for 1 month  Orders: Reassessment Each 15 min unit- [facility] ([id number])  Problem # 2:  HYPERCHOLESTEROLEMIA (ICD-272.0) Assessment: Comment Only Last Lipid panel done 08/2008...repeat at next lab draw.  Her updated medication list for this problem includes:    Simvastatin 80 Mg Tabs (Simvastatin) .Marland Kitchen... 1 by mouth at bedtime  Complete Medication List: 1)  Amlodipine Besylate 10 Mg Tabs (Amlodipine besylate) .... Take 1 tablet by mouth once a day 2)  Bayer Childrens Aspirin 81 Mg Chew (Aspirin) .... Take 1 tablet by mouth once a day 3)  Glucophage 1000 Mg Tabs (Metformin hcl) .Marland Kitchen.. 1 tablet by mouth twice a day 4)  Metoprolol Tartrate 100 Mg Tabs (Metoprolol tartrate) .Marland Kitchen.. 1 by mouth two times a day 5)  Neurontin 300 Mg Caps (Gabapentin) .Marland Kitchen.. 1 pill 4x/day 6)  Lantus 100 Unit/ml Soln (Insulin glargine) .... Inject 22 units daily.  dispense qs x 45month 7)  Lisinopril-hydrochlorothiazide 10-12.5 Mg Tabs (Lisinopril-hydrochlorothiazide) .... One tab by mouth daily 8)  Miralax Powd (Polyethylene glycol 3350) .Marland Kitchen.. 17g by mouth once daily as needed for constipation.  mix w/ 8 oz of water.  disp 1 large container 9)  Fluticasone Propionate 50 Mcg/act Susp (Fluticasone propionate) .... 2 sprays each nostril once daily 10)  Simvastatin 80 Mg Tabs (Simvastatin) .Marland Kitchen.. 1 by mouth at bedtime 11)  Ranitidine Hcl 150 Mg Caps (Ranitidine hcl) .... Two times a day. 12)  Novolog 100 Unit/ml Soln (Insulin aspart) .... 6 units sq before dinner.  if pre-meal blood sugar is <110, do not take the novolog at that meal; dispense qs for 1 month 13)  Bacitracin 500 Unit/gm Oint (Bacitracin) .... Apply to affected area three times a day until symptoms resolve disp: large tube 14)  Triamcinolone Acetonide 0.1 % Oint (Triamcinolone acetonide) .... Apply to rash on leg two times a day for 2 weeks.  dispense one large tube  Patient Instructions: 1)  Continue checking blood glucose 4 times daily.  Please indicate when you take your  insulin by marking the dose for example.  2)  Continue Metformin twice daily. 3)  Continue Lantus 22 units each morning. 4)  Take Novolog 6 prior to evening meal.  5)  Return to office for RX Clinic in 2-3 weeks.  Schedule appointment as you leave today.   Prevention & Chronic Care Immunizations   Influenza vaccine: Fluvax 3+  (07/16/2009)   Influenza vaccine due: 06/13/2008    Tetanus booster: 12/06/2003: Done.   Tetanus booster due: 12/05/2013    Pneumococcal vaccine: Done.  (04/03/2003)   Pneumococcal vaccine due: None  Colorectal Screening   Hemoccult: Not documented   Hemoccult due: Not Indicated    Colonoscopy: Results: Internal Hemorrhoids.     Location:  [facility].     (01/31/2008)   Colonoscopy due: 01/30/2018  Other Screening   Pap smear: NEGATIVE FOR INTRAEPITHELIAL LESIONS OR MALIGNANCY.  (10/16/2008)   Pap smear due:  12/10/2008    Mammogram: normal  (10/12/2008)   Mammogram action/deferral: Screening mammogram in 1 year.     (10/08/2006)   Mammogram due: 10/12/2009   Smoking status: never  (07/16/2009)  Diabetes Mellitus   HgbA1C: 7.0  (07/16/2009)   Hemoglobin A1C due: 03/12/2008    Eye exam: diabetic retinopathy  (01/01/2007)   Eye exam due: 01/01/2008    Foot exam: yes  (10/16/2008)   High risk foot: Not documented   Foot care education: Not documented   Foot exam due: 02/06/2009    Urine microalbumin/creatinine ratio: Not documented   Urine microalbumin/cr due: 10/15/2007    Diabetes flowsheet reviewed?: Yes   Progress toward A1C goal: At goal  Lipids   Total Cholesterol: 157  (08/20/2008)   LDL: 77  (08/20/2008)   LDL Direct: 96  (12/11/2007)   HDL: 69  (08/20/2008)   Triglycerides: 57  (08/20/2008)    SGOT (AST): 14  (04/22/2009)   SGPT (ALT): 14  (04/22/2009)   Alkaline phosphatase: 93  (04/22/2009)   Total bilirubin: 0.3  (04/22/2009)    Lipid flowsheet reviewed?: Yes   Progress toward LDL goal: At goal  Hypertension    Last Blood Pressure: 110 / 66  (08/20/2009)   Serum creatinine: 0.55  (04/22/2009)   Serum potassium 3.6  (04/22/2009)    Hypertension flowsheet reviewed?: Yes   Progress toward BP goal: At goal  Self-Management Support :   Personal Goals (by the next clinic visit) :     Personal A1C goal: 8  (04/22/2009)     Personal blood pressure goal: 130/80  (04/22/2009)     Personal LDL goal: 100  (04/22/2009)    Diabetes self-management support: Written self-care plan  (08/20/2009)   Diabetes care plan printed    Diabetes self-management support not done because: Good outcomes  (04/22/2009)    Hypertension self-management support: Not documented    Hypertension self-management support not done because: Good outcomes  (08/20/2009)    Lipid self-management support: Not documented     Lipid self-management support not done because: Good outcomes  (08/20/2009)

## 2010-08-02 NOTE — Miscellaneous (Signed)
Summary: testing frequency  Clinical Lists Changes she is running out of test strips. tests up to 4 times a day. A1Cs are stable. Unable to see how often she is to test from OV notes. told her I will check with md on Monday & let her know how often she should be testing. Then we can order the appropriate number of test strips so she does not run out. she has a new bottle of 50 at this time.Marland KitchenMarland KitchenGolden Circle RN  December 16, 2008 9:02 AM   Is on insulin so it is not inappropriate to test up to 4 times daily.

## 2010-08-02 NOTE — Progress Notes (Signed)
  Phone Note Call from Patient   Caller: Patient Summary of Call: Pt called via sign language interpreter.  States she has been having side pain in her rib area for approx 7 days.  States she has not been coughing.  She thinks she could have injured it playing football in her yard with her husband.  Denies fever.  Scheduled her this afternoon at 3pm.  Called for sign language interpreter for appt. Initial call taken by: AMY MARTIN RN,  May 12, 2008 9:17 AM

## 2010-08-02 NOTE — Miscellaneous (Signed)
Summary: med change  Clinical Lists Changes  pt dropped off blood sugar log.  She is quite low at night.  low to low-normal fasting.  last A1c 7.7.  will decrease lantus to 20 and add nov 5 meal covg.  attempted to contact pt and work and home numbers.  will send letter.    Appended Document: med change    Clinical Lists Changes  Medications: Changed medication from LANTUS 100 UNIT/ML SOLN (INSULIN GLARGINE) inject 30 units daily.  Dispense QS x 53month to LANTUS 100 UNIT/ML SOLN (INSULIN GLARGINE) inject 20 units daily.  Dispense QS x 53month Added new medication of NOVOLOG 100 UNIT/ML SOLN (INSULIN ASPART) 5 units subcutaneously before each meal.  If pre-meal blood sugar is <110, do not take the novolog at that meal; dispense qs for 1 month - Signed Rx of NOVOLOG 100 UNIT/ML SOLN (INSULIN ASPART) 5 units subcutaneously before each meal.  If pre-meal blood sugar is <110, do not take the novolog at that meal; dispense qs for 1 month;  #1 x 6;  Signed;  Entered by: Asher Muir MD;  Authorized by: Asher Muir MD;  Method used: Electronically to [facility]  [address] [id number] [address] [place], [place]  [address], Ph: [phone] or [phone], Fax: [phone]    Prescriptions: NOVOLOG 100 UNIT/ML SOLN (INSULIN ASPART) 5 units subcutaneously before each meal.  If pre-meal blood sugar is <110, do not take the novolog at that meal; dispense qs for 1 month  #1 x 6   Entered and Authorized by:   Asher Muir MD   Signed by:   Asher Muir MD on 05/13/2009   Method used:   [facility]ronically to        [facility]  [address] [id number] (retail)       [address]       [place], [place]  [address]       Ph: [phone] or [phone]       Fax: [phone]   RxID:   [id number]

## 2010-08-02 NOTE — Letter (Signed)
Summary: Generic Letter  [facility]  [address]   [place], [place] [address]   Phone: [phone]  Fax: [phone]    03/15/2010  MARLENA BARBATO [address] [place], [place]  [address]  Dear To whom it may concern:  Ms. Kareem is deaf and thus will be unable to serve on a jury.       Sincerely,   Golden Circle RN for Dr. Roberto Scales

## 2010-08-02 NOTE — Assessment & Plan Note (Signed)
Summary: prevention update   Last HDL:  69 (08/20/2008 8:20:00 PM) HDL Next Due:  1 yr Last LDL:  77 (08/20/2008 8:20:00 PM) LDL Next Due:  1 yr Last HGBA1C:  7.7 (07/24/2008 8:59:12 AM) HGBA1C Result Date:  07/24/2008 HGBA1C Next Due:  3 mo

## 2010-08-02 NOTE — Letter (Signed)
Summary: Letter from patient  Letter from patient   Imported By: De Nurse 08/06/2009 16:56:16  _____________________________________________________________________  External Attachment:    Type:   Image     Comment:   External Document

## 2010-08-02 NOTE — Miscellaneous (Signed)
Summary: Problem update-asthma  Clinical Lists Changes  Problems: Added new problem of ASTHMA, INTERMITTENT (ICD-493.90) Removed problem of ASTHMA, UNSPECIFIED (ICD-493.90) 

## 2010-08-02 NOTE — Letter (Signed)
Summary: Generic Letter  [facility]  [address]   [place], [place] [address]   Phone: [phone]  Fax: [phone]    05/15/2010  ZARAHI FUERST [address] [place], [place]  [address]  Dear Ms. BORRAYO,  Your recent blood work was normal. Your A1C was 7.3, which is at your goal of less than 8. Again, great job on the weight loss!  I noticed that Dr. Raymondo Band had recently seen you in clinic, and he recommended you decrease Lantus to 8 units daily. This is a good idea, since it will decrease the chances of another low blood-sugar event.   I also see that you were previously taking a medication called Hydrochlorothiazide (HCTZ) for your blood pressure, and now it is not on your medication list. This may be why your blood pressure is higher in clinic. I would like for you to start this medication again, unless there is a good reason that you stopped it. [facility] [address]  Please call with any questions you may have.    Sincerely,   Lloyd Huger MD  Appended Document: Generic Letter mailed

## 2010-08-02 NOTE — Miscellaneous (Signed)
Summary: prevention update  Clinical Lists Changes  Observations: Added new observation of LAST MAM DAT: 10/12/2008 (10/12/2008 8:49) Added new observation of MAMMO DUE: 10/12/2009 (10/12/2008 8:49) Added new observation of MAMMOGRAM: normal (10/12/2008 8:49)     Last Mammogram:  ASSESSMENT: Negative - BI-RADS 1^MM DIGITAL SCREENING (10/09/2008 9:06:00 AM) Mammogram Result Date:  10/12/2008 Mammogram Result:  normal Mammogram Next Due:  1 yr

## 2010-08-02 NOTE — Assessment & Plan Note (Signed)
Summary: DM F/U - Rx Clinic   Vital Signs:  Patient profile:   55 year old female Height:      63 inches Weight:      172 pounds BMI:     30.58 Pulse rate:   88 / minute BP sitting:   135 / 76  (right arm)  Primary Care Provider:  Lloyd Huger MD   History of Present Illness:  39 YOBF with 22 years history of DM currently on Metformin 1000mg  BID, Lantus 10 units QAM and Novolog SSI prior to meals; 2 units at base then + 1 unit if BS between 150-250, + 2 units if BS > 250.  She presented today to f/u diabetes management accompanied by sign language interpreter Huntley Dec).     Current Medications (verified): 1)  Amlodipine Besylate 10 Mg Tabs (Amlodipine Besylate) .... Take 1 Tablet By Mouth Once A Day 2)  Bayer Childrens Aspirin 81 Mg Chew (Aspirin) .... Take 1 Tablet By Mouth Once A Day 3)  Glucophage 1000 Mg Tabs (Metformin Hcl) .Marland Kitchen.. 1 Tablet By Mouth Twice A Day 4)  Neurontin 300 Mg  Caps (Gabapentin) .Marland Kitchen.. 1 Pill 4x/day 5)  Lantus 100 Unit/ml Soln (Insulin Glargine) .... Inject 8  Units in The Am Prior To Breakfastl.   Dispense Qs X 59month 6)  Miralax   Powd (Polyethylene Glycol 3350) .Marland Kitchen.. 17g By Mouth Three Times Per Week As Needed For Constipation.  Mix W/ 8 Oz of Water.  Disp 1 Large Container 7)  Fluticasone Propionate 50 Mcg/act  Susp (Fluticasone Propionate) .... 2 Sprays Each Nostril Once Daily 8)  Lipitor 40 Mg Tabs (Atorvastatin Calcium) .Marland Kitchen.. 1 Tab By Mouth At Bedtime For Cholesterol 9)  Bd Insulin Syringe Ultrafine 30g X 1/2" 1 Ml Misc (Insulin Syringe-Needle U-100) .... Use For 5 Insulin Injections Daily.  Dispense One Month Supply.  Refill Prn 10)  Novolog 100 Unit/ml Soln (Insulin Aspart) .... Inject 2 Units Prior To Each Meal.  If Pre-Meal Blood Glucose Is 150-250 Give 3 Units Total and If Blood Glucose Is >250 Give 4 Units Total. 11)  Losartan Potassium 100 Mg Tabs (Losartan Potassium) .... One Daily 12)  Metoprolol Tartrate 100 Mg Tabs (Metoprolol Tartrate) .... Take 1/2  Tablet Twice Daily  Allergies: 1)  ! Sulfa 2)  ! * Altace   Impression & Recommendations:  Problem # 1:  DIABETES MELLITUS, II, COMPLICATIONS (ICD-250.92)  Diabetes of 22  yrs duration currently under fair control of blood glucose based on A1C of 7.2.  CBGs range of 40-253, although rare incidents of the extremes; 2 low incidents (40-50s) in the early morning (3-4AM) and a few high in the 240-250s.  Patient appeared to be adhering to current medications.  Average daily Novolog use  ~8 units.  Adjusted Lantus to 8 units QAM and continued Novolog SSI and metformin.  Written pt instructions provided:   Patient lost a total of  ~20 lbs over the past 3 years.  She's willing to lose 5-10 more lbs. With current weight reduction and cutting back on Lantus dose, may consider switching to oral therapy in the near future.  F/U Rx Clinic in 1 month.  Will check A1C at next vist.  Visit:  TTFFC: 30 mins.  Pt seen with Geoffry Paradise, PharmD resident and Harrel Carina PharmD candidate.  Her updated medication list for this problem includes:    Bayer Childrens Aspirin 81 Mg Chew (Aspirin) .Marland Kitchen... Take 1 tablet by mouth once a day  Glucophage 1000 Mg Tabs (Metformin hcl) .Marland Kitchen... 1 tablet by mouth twice a day    Lantus 100 Unit/ml Soln (Insulin glargine) ..... Inject 8  units in the am prior to breakfastl.   dispense qs x 103month    Novolog 100 Unit/ml Soln (Insulin aspart) ..... Inject 2 units prior to each meal.  if pre-meal blood glucose is 150-250 give 3 units total and if blood glucose is >250 give 4 units total.    Losartan Potassium 100 Mg Tabs (Losartan potassium) ..... One daily  [id number]  Problem # 2:  GASTROPARESIS (ICD-536.3)  Patient expressed concern with continued bloating despite on Miralax as needed.  Adjusted her Miralax to 3 times per week.  Reevaluate at next visit.  Potentially increase dose at next visit.   [id number]  Complete Medication List: 1)  Amlodipine Besylate 10 Mg Tabs (Amlodipine besylate) .... Take 1 tablet by mouth once a day 2)  Bayer Childrens Aspirin 81 Mg Chew (Aspirin) .... Take 1 tablet by mouth once a day 3)  Glucophage 1000 Mg Tabs (Metformin hcl) .Marland Kitchen.. 1 tablet by mouth twice a day 4)  Neurontin 300 Mg Caps (Gabapentin) .Marland Kitchen.. 1 pill 4x/day 5)  Lantus 100 Unit/ml Soln (Insulin glargine) .... Inject 8  units in the am prior to breakfastl.   dispense qs x 103month 6)  Miralax Powd (Polyethylene glycol 3350) .Marland Kitchen.. 17g by mouth three times per week as needed for constipation.  mix w/ 8 oz of water.  disp 1 large container 7)  Fluticasone Propionate 50 Mcg/act Susp (Fluticasone propionate) .... 2 sprays each nostril once daily 8)  Lipitor 40 Mg Tabs (Atorvastatin calcium) .Marland Kitchen.. 1 tab by mouth at bedtime for cholesterol 9)  Bd Insulin Syringe Ultrafine 30g X 1/2" 1 Ml Misc (Insulin syringe-needle u-100) .... Use for 5 insulin injections daily.  dispense one month supply.  refill prn 10)  Novolog 100 Unit/ml Soln (Insulin aspart) .... Inject 2 units prior to each meal.  if pre-meal blood glucose is 150-250 give 3 units total and if blood glucose is >250 give 4 units total. 11)  Losartan Potassium 100 Mg Tabs (Losartan potassium) .... One daily 12)  Metoprolol Tartrate 100 Mg Tabs (Metoprolol tartrate) .... Take 1/2 tablet twice daily  Patient Instructions: 1)  Change Lantus to 8 units each morning.  2)  No change in Novolog. 3)  Increase your Miralax (PEG) to three times per week.  4)  Follow up with Dr. Cristal Ford.  Prescriptions: AMLODIPINE BESYLATE 10 MG TABS (AMLODIPINE BESYLATE) Take 1 tablet by mouth once a day  #30 x 5   Entered by:   Christian Mate D   Authorized by:   Lloyd Huger MD   Signed by:   Madelon Lips Pharm D on 04/15/2010   Method used:   [facility]        [facility]  [address] [id number]       [address]       [place], [place]  [address]       Ph: [phone] or [phone]       Fax: [phone]   RxID:   [id number] LANTUS 100 UNIT/ML SOLN (INSULIN GLARGINE) inject 8  units in the AM Prior to Breakfastl.   Dispense QS x 103month  #1 x 5   Entered by:   Christian Mate D  Authorized by:   Lloyd Huger MD   Signed by:   Madelon Lips Pharm D on 04/15/2010   Method used:   [facility]        [facility]  [address] [id number]       [address]       [place], [place]  [address]       Ph: [phone] or [phone]       Fax: [phone]   RxID:   [id number] METOPROLOL TARTRATE 100 MG TABS (METOPROLOL TARTRATE) Take 1/2 tablet twice daily  #1 x 0   Entered and Authorized by:   Christian Mate D   Signed by:   Madelon Lips Pharm D on 04/15/2010   Method used:   Historical   RxID:   [id number] MIRALAX   POWD (POLYETHYLENE GLYCOL 3350) 17g by mouth Three times per week as needed for constipation.  Mix w/ 8 oz of water.  Disp 1 large container  #1 x 0   Entered and Authorized by:   Christian Mate D   Signed by:   Madelon Lips Pharm D on 04/15/2010   Method used:   Historical   RxID:   [id number]   Prevention & Chronic Care Immunizations   Influenza vaccine: Fluvax 3+  (07/16/2009)   Influenza vaccine due: 06/13/2008    Tetanus booster: 12/06/2003: Done.   Tetanus booster due: 12/05/2013    Pneumococcal vaccine: Done.  (04/03/2003)   Pneumococcal vaccine due: None  Colorectal Screening   Hemoccult: Not documented   Hemoccult due: Not Indicated    Colonoscopy: Results: Internal Hemorrhoids.     Location:  [facility].     (01/31/2008)   Colonoscopy action/deferral: Deferred  (02/21/2010)   Colonoscopy due: 01/30/2018  Other Screening   Pap smear: NEGATIVE FOR INTRAEPITHELIAL LESIONS OR MALIGNANCY.  (10/16/2008)   Pap smear action/deferral: Deferred-3 yr interval  (02/21/2010)   Pap smear due: 10/17/2011    Mammogram: No specific  mammographic evidence of malignancy.    (10/12/2009)   Mammogram action/deferral: Deferred  (02/21/2010)   Mammogram due: 10/2010   Smoking status: never  (02/21/2010)  Diabetes Mellitus   HgbA1C: 7.2  (02/21/2010)   Hemoglobin A1C due: 03/12/2008    Eye exam: diabetic retinopathy  (02/07/2010)   Eye exam due: 02/2011    Foot exam: yes  (02/21/2010)   High risk foot: Not documented   Foot care education: Not documented   Foot exam due: 02/22/2011    Urine microalbumin/creatinine ratio: Not documented   Urine microalbumin action/deferral: Not indicated   Urine microalbumin/cr due: 10/15/2007    Diabetes flowsheet reviewed?: Yes   Progress toward A1C goal: At goal  Lipids   Total Cholesterol: 157  (08/20/2008)   Lipid panel action/deferral: LDL Direct ordered   LDL: 77  (08/20/2008)   LDL Direct: 75  (02/21/2010)   HDL: 69  (08/20/2008)   Triglycerides: 57  (08/20/2008)   Lipid panel due: 03/24/2010    SGOT (AST): 14  (04/22/2009)   SGPT (ALT): 14  (04/22/2009)   Alkaline phosphatase: 93  (04/22/2009)   Total bilirubin: 0.3  (04/22/2009)    Lipid flowsheet reviewed?: Yes   Progress toward LDL goal: At goal  Hypertension   Last Blood Pressure: 135 / 76  (04/15/2010)   Serum creatinine: 0.55  (04/22/2009)   Serum potassium 3.6  (04/22/2009)   Basic metabolic panel due: 03/24/2010    Hypertension flowsheet reviewed?: Yes  Progress toward BP goal: Unchanged  Self-Management Support :   Personal Goals (by the next clinic visit) :     Personal A1C goal: 8  (04/22/2009)     Personal blood pressure goal: 130/80  (04/22/2009)     Personal LDL goal: 100  (04/22/2009)    Diabetes self-management support: CBG self-monitoring log, Written self-care plan  (10/12/2009)    Diabetes self-management support not done because: Good outcomes  (04/22/2009)    Hypertension self-management support: Written self-care plan  (04/15/2010)   Hypertension self-care plan printed.     Hypertension self-management support not done because: Good outcomes  (10/29/2009)    Lipid self-management support: Not documented     Lipid self-management support not done because: Good outcomes  (10/29/2009)

## 2010-08-02 NOTE — Progress Notes (Signed)
Summary: triage  Phone Note Call from Patient Call back at [phone]   Caller: Patient Summary of Call: [facility] Sim[facility]atin that she can't take it and she has not had it for a week.   Initial call taken by: Clydell Hakim,  December 28, 2009 9:12 AM  Follow-up for Phone Call        using video relay, I LM that she is to discuss with her md tomorrow. she did not pick up the phone I made sure she had an interpretor scheduled for her appt tomorrow Follow-up by: Golden Circle RN,  December 28, 2009 9:49 AM  Additional Follow-up for Phone Call Additional follow up Details #1::        her appt was actually today, and she missed it.  let's just change her to lipitor 40mg .  Her pharmacy is probably advising against the 80mg  simva dose.  I put in the new medicine.  would you mind calling her and sending this to the pharmacy of her choice?  Thanks! Additional Follow-up by: Asher Muir MD,  December 28, 2009 1:11 PM    Additional Follow-up for Phone Call Additional follow up Details #2::    LM with her spouse. pt is at work. told him she will need to call & reschedule. used video relay Follow-up by: Golden Circle RN,  December 28, 2009 2:08 PM  New/Updated Medications: LIPITOR 40 MG TABS (ATORVASTATIN CALCIUM) 1 tab by mouth at bedtime for cholesterol Prescriptions: LIPITOR 40 MG TABS (ATORVASTATIN CALCIUM) 1 tab by mouth at bedtime for cholesterol  #31 x 6   Entered and Authorized by:   Asher Muir MD   Signed by:   Asher Muir MD on 12/28/2009   Method used:   Print then Give to Patient   RxID:   [id number]

## 2010-08-02 NOTE — Progress Notes (Signed)
  Phone Note Call from Patient   Caller: Patient Summary of Call: spoke via interpreter.  pt's sugar in 400s now.  she is worried.  at first reports her sugars are 200s-400s past week.  however, when I ask if she has had any lows, she says yes.  her meter read "low" yesterday at 11:30 am.  upon further questioning she states getting low after breakfast.  going very high (400s) first thing in the morning.  Advised pt to stop novolog with breakfast and to increase dinner novolog to 8 units.  reminded her that she has pharm clinic appt on 2/18 at 9:45.  Advised pt to eat 3 meals a day and to not take novolog if she does not eat her meal.   Initial call taken by: Asher Muir MD,  August 13, 2009 5:22 PM    New/Updated Medications: NOVOLOG 100 UNIT/ML SOLN (INSULIN ASPART) 5 units subcutaneously before lunch and 8 units sq before dinner.  If pre-meal blood sugar is <110, do not take the novolog at that meal; dispense qs for 1 month

## 2010-08-02 NOTE — Assessment & Plan Note (Signed)
Summary: F/U Diabetes - Rx Clinic   Vital Signs:  Patient profile:   55 year old female Height:      63 inches Weight:      169 pounds BMI:     30.05 Pulse rate:   79 / minute BP sitting:   134 / 75  (right arm)  Primary Care Provider:  Lloyd Huger MD   History of Present Illness: Arrives with interpreter (sign language) - Sarah.   She reports eating three meals per day.  She denies skipping any meals.   Her diet recall appears to be a variety of healthy selections.  Home blood glucse reading logs reveals consistent readings below 100.  She reports having symptomatic low blood glucose including feeling confused and sweating.   Also complaining of eye twitching.  Stated she had seen the eye doctor on 8/8 but eye still twitching.  Current Medications (verified): 1)  Amlodipine Besylate 10 Mg Tabs (Amlodipine Besylate) .... Take 1 Tablet By Mouth Once A Day 2)  Bayer Childrens Aspirin 81 Mg Chew (Aspirin) .... Take 1 Tablet By Mouth Once A Day 3)  Glucophage 1000 Mg Tabs (Metformin Hcl) .Marland Kitchen.. 1 Tablet By Mouth Twice A Day 4)  Neurontin 300 Mg  Caps (Gabapentin) .Marland Kitchen.. 1 Pill 4x/day 5)  Lantus 100 Unit/ml Soln (Insulin Glargine) .... Inject 9 Units in The Am and 10 Units in The Pm.   Prior To Breakfast and Prior To Evenng Meal.   Dispense Qs X 62month 6)  Miralax   Powd (Polyethylene Glycol 3350) .Marland Kitchen.. 17g By Mouth Once Daily As Needed For Constipation.  Mix W/ 8 Oz of Water.  Disp 1 Large Container 7)  Fluticasone Propionate 50 Mcg/act  Susp (Fluticasone Propionate) .... 2 Sprays Each Nostril Once Daily 8)  Lipitor 40 Mg Tabs (Atorvastatin Calcium) .Marland Kitchen.. 1 Tab By Mouth At Bedtime For Cholesterol 9)  Bd Insulin Syringe Ultrafine 30g X 1/2" 1 Ml Misc (Insulin Syringe-Needle U-100) .... Use For 5 Insulin Injections Daily.  Dispense One Month Supply.  Refill Prn 10)  Metoprolol Tartrate 50 Mg Tabs (Metoprolol Tartrate) .... Take 1 By Mouth Twice Daily 11)  Losartan Potassium 100 Mg Tabs  (Losartan Potassium) .... Take One By Mouth Daily  Allergies (verified): 1)  ! Sulfa 2)  ! * Altace   Impression & Recommendations:  Problem # 1:  DIABETES MELLITUS, II, COMPLICATIONS (ICD-250.92) Assessment Unchanged  Type 2 diabetes currently under: good control of blood glucose based on A1C [id number] in the low 7 range.  fasting CBGs of: wide range. Several in the 50-70 range with values as high as 400. Before meal blood sugars are also wide ranging from as low as 25 to as high as 335. Control is suboptimal due to: frequent low blood sugars (substantially more lows than highs).  Able to communicate appropriate hypoglycemia management plan. Was given glucagon at last visit with Dr. Haze Justin. Decreased basal insulin Lantus to 14 units in the morning in one shot instead of splitting into two separate doses (pt was previously doing 9 in morning and 10 in evening). Goal to prevent frequent low blood sugars. Pt has lost weight (169 lbs today) and is eating healthy.  Instructed patient to check blood sugars at least twice daily (fasting and 2 h PP after dinner since that seems to be her largest meal). Not able to exercise due to shoulder pain.  Written pt instructions provided:yes.   F/U Rx Clinic Visit:3 to 4 weeks  TTFFC: 45 mins.  Pt seen with: Lyna Poser, Kennieth Francois  Her updated medication list for this problem includes:    Bayer Childrens Aspirin 81 Mg Chew (Aspirin) .Marland Kitchen... Take 1 tablet by mouth once a day    Glucophage 1000 Mg Tabs (Metformin hcl) .Marland Kitchen... 1 tablet by mouth twice a day    Lantus 100 Unit/ml Soln (Insulin glargine) ..... Inject 14 units in the am prior to breakfastl.   dispense qs x 77month    Losartan Potassium 100 Mg Tabs (Losartan potassium) .Marland Kitchen... Take one by mouth daily  Orders: Reassessment Each 15 min unit- [facility] ([id number])  Problem # 2:  HYPERTENSION, BENIGN SYSTEMIC (ICD-401.1) Assessment: Deteriorated  BP today was 134/75. Patient states she is not taking the  hydrochlorothiazide. Not at goal of <130/80 but will monitor pressures at next visit. If still not at goal, would consider adding back the hydrochlorothiazide. The following medications were removed from the medication list:     Hydrochlorothiazide 25 Mg Tabs (Hydrochlorothiazide) .Marland Kitchen... 1/2 tablets daily Her updated medication list for this problem includes:    Amlodipine Besylate 10 Mg Tabs (Amlodipine besylate) .Marland Kitchen... Take 1 tablet by mouth once a day    Metoprolol Tartrate 50 Mg Tabs (Metoprolol tartrate) .Marland Kitchen... Take 1 by mouth twice daily    Losartan Potassium 100 Mg Tabs (Losartan potassium) .Marland Kitchen... Take one by mouth daily  [facility]: [id number]  Complete Medication List: 1)  Amlodipine Besylate 10 Mg Tabs (Amlodipine besylate) .... Take 1 tablet by mouth once a day 2)  Bayer Childrens Aspirin 81 Mg Chew (Aspirin) .... Take 1 tablet by mouth once a day 3)  Glucophage 1000 Mg Tabs (Metformin hcl) .Marland Kitchen.. 1 tablet by mouth twice a day 4)  Neurontin 300 Mg Caps (Gabapentin) .Marland Kitchen.. 1 pill 4x/day 5)  Lantus 100 Unit/ml Soln (Insulin glargine) .... Inject 14 units in the am prior to breakfastl.   dispense qs x 77month 6)  Miralax Powd (Polyethylene glycol 3350) .Marland Kitchen.. 17g by mouth once daily as needed for constipation.  mix w/ 8 oz of water.  disp 1 large container 7)  Fluticasone Propionate 50 Mcg/act Susp (Fluticasone propionate) .... 2 sprays each nostril once daily 8)  Lipitor 40 Mg Tabs (Atorvastatin calcium) .Marland Kitchen.. 1 tab by mouth at bedtime for cholesterol 9)  Bd Insulin Syringe Ultrafine 30g X 1/2" 1 Ml Misc (Insulin syringe-needle u-100) .... Use for 5 insulin injections daily.  dispense one month supply.  refill prn 10)  Metoprolol Tartrate 50 Mg Tabs (Metoprolol tartrate) .... Take 1 by mouth twice daily 11)  Losartan Potassium 100 Mg Tabs (Losartan potassium) .... Take one by mouth daily  Patient Instructions: 1)  Change Lantus to 14 units each morning.  2)  Keep  your NOVOLOG in the refrigerator.Marland KitchenMarland KitchenWe are not using it now...BUT we may use it again in the future.  3)  Our goal in the next few weeks is to avoid low blood sugars and have NO readings below 80.   We would be happy to have readings 80-150 AND an occasional reading of 150-250 is OK. 4)  Please keep up your great work on your diet and try to stay active.  5)  You have lost about 10 pounds over the last few years which is why you may be having more low blood sugars recently.  6)  Return to the office to in 3 weeks for a visit with Rx Clinic - to see Dr. Raymondo Band.   Prescriptions: LANTUS 100 UNIT/ML  SOLN (INSULIN GLARGINE) inject 14 units in the AM Prior to Breakfastl.   Dispense QS x 59month  #1 x 0   Entered and Authorized by:   Christian Mate D   Signed by:   Madelon Lips Pharm D on 03/01/2010   Method used:   Historical   RxID:   [id number]   Prevention & Chronic Care Immunizations   Influenza vaccine: Fluvax 3+  (07/16/2009)   Influenza vaccine due: 06/13/2008    Tetanus booster: 12/06/2003: Done.   Tetanus booster due: 12/05/2013    Pneumococcal vaccine: Done.  (04/03/2003)   Pneumococcal vaccine due: None  Colorectal Screening   Hemoccult: Not documented   Hemoccult due: Not Indicated    Colonoscopy: Results: Internal Hemorrhoids.     Location:  [facility].     (01/31/2008)   Colonoscopy action/deferral: Deferred  (02/21/2010)   Colonoscopy due: 01/30/2018  Other Screening   Pap smear: NEGATIVE FOR INTRAEPITHELIAL LESIONS OR MALIGNANCY.  (10/16/2008)   Pap smear action/deferral: Deferred-3 yr interval  (02/21/2010)   Pap smear due: 10/17/2011    Mammogram: No specific mammographic evidence of malignancy.    (10/12/2009)   Mammogram action/deferral: Deferred  (02/21/2010)   Mammogram due: 10/2010   Smoking status: never  (02/21/2010)  Diabetes Mellitus   HgbA1C: 7.2  (02/21/2010)   Hemoglobin A1C due: 03/12/2008    Eye exam: diabetic retinopathy   (02/07/2010)   Eye exam due: 02/2011    Foot exam: yes  (02/21/2010)   High risk foot: Not documented   Foot care education: Not documented   Foot exam due: 02/22/2011    Urine microalbumin/creatinine ratio: Not documented   Urine microalbumin action/deferral: Not indicated   Urine microalbumin/cr due: 10/15/2007    Diabetes flowsheet reviewed?: Yes   Progress toward A1C goal: At goal  Lipids   Total Cholesterol: 157  (08/20/2008)   Lipid panel action/deferral: LDL Direct ordered   LDL: 77  (08/20/2008)   LDL Direct: 75  (02/21/2010)   HDL: 69  (08/20/2008)   Triglycerides: 57  (08/20/2008)   Lipid panel due: 03/24/2010    SGOT (AST): 14  (04/22/2009)   SGPT (ALT): 14  (04/22/2009)   Alkaline phosphatase: 93  (04/22/2009)   Total bilirubin: 0.3  (04/22/2009)    Lipid flowsheet reviewed?: Yes   Progress toward LDL goal: At goal  Hypertension   Last Blood Pressure: 134 / 75  (03/01/2010)   Serum creatinine: 0.55  (04/22/2009)   Serum potassium 3.6  (04/22/2009)   Basic metabolic panel due: 03/24/2010    Hypertension flowsheet reviewed?: Yes   Progress toward BP goal: Improved  Self-Management Support :   Personal Goals (by the next clinic visit) :     Personal A1C goal: 8  (04/22/2009)     Personal blood pressure goal: 130/80  (04/22/2009)     Personal LDL goal: 100  (04/22/2009)    Diabetes self-management support: CBG self-monitoring log, Written self-care plan  (10/12/2009)    Diabetes self-management support not done because: Good outcomes  (04/22/2009)    Hypertension self-management support: Written self-care plan  (10/12/2009)    Hypertension self-management support not done because: Good outcomes  (10/29/2009)    Lipid self-management support: Not documented     Lipid self-management support not done because: Good outcomes  (10/29/2009)

## 2010-08-02 NOTE — Progress Notes (Signed)
  Phone Note Outgoing Call   Summary of Call: Would you mind calling Alicia [name] and asking her to schedule appointment in pharmacy clinic to discuss her diabetes medicines at her earliest convenience.  I reviewed the blood sugar log she brought me, and I am concerned that she is having low sugars at times.  I tried to call her, but no answer.  Thanks Initial call taken by: Asher Muir MD,  August 03, 2009 4:23 PM  Follow-up for Phone Call        No answer Follow-up by: Gladstone Pih,  August 03, 2009 4:59 PM  Additional Follow-up for Phone Call Additional follow up Details #1::        Letter sent with information, unable to get answer Additional Follow-up by: Gladstone Pih,  August 04, 2009 11:55 AM

## 2010-08-02 NOTE — Procedures (Signed)
Summary: Colonoscopy    Colonoscopy  Procedure date:  01/31/2008  Findings:      Results: Internal Hemorrhoids.     Location:  [facility].     Procedures Next Due Date:    Colonoscopy: 01/2013   Colonoscopy  Procedure date:  01/31/2008  Findings:      Results: Internal Hemorrhoids.     Location:  [facility].     Procedures Next Due Date:    Colonoscopy: 01/2013

## 2010-08-04 NOTE — Miscellaneous (Signed)
Summary: triage call-hyperglycemia  Clinical Lists Changes  patient calls reporting blood sugars have been elevated this week running in 200-400 range fasting in AM.  today's reading in 366 fasting.  she also checks at lunchtime prior to meal with usual reading in 200 range and at bedtime in 200 range also. she take Lantus 8 units in AM and takes Novolog 4 units before meals. will consult preceptor for further instructions. Theresia Lo RN  June 30, 2010 9:59 AM  watch diet, record blood sugars, make appt next 2-4 weeks  Denny Levy MD  June 30, 2010 10:01 AM   daughter notified and patient is there with her.Marland Kitchen appointment scheduled for 07/14/2010.  intrepretor scheduled also . will forward message to Dr. Cristal Ford on her return. Theresia Lo RN  June 30, 2010 10:07 AM

## 2010-08-04 NOTE — Assessment & Plan Note (Signed)
Summary: Insulin Pen Education - Rx   Nurse Visit Jodene Nam - Interpreter  Patient in good spirits for education on lantus solostar pen sample which will be used until Rx supply is obtained 12/23.    Reports sugars have been OK.  BUT had some high readings after a fall last week.  She has recovered from that and is feeling good.     Allergies: 1)  ! Sulfa 2)  ! * Altace   Lantus Solostar Education provided through interpreter. Written information on injection with pen provided.   Patient expressed understanding of use of insulin pen at same dose as vial.  8 units each AM.   Follow up in January. Rx Clinic.   TTFFC 12 minutes.    Appended Document: Orders Update    Clinical Lists Changes  Orders: Added new Service order of No Charge Patient Arrived (NCPA0) (NCPA0) - Signed

## 2010-08-04 NOTE — Progress Notes (Signed)
  Phone Note Call from Patient   Caller: Patient Call For: [phone] Summary of Call: Pt is almost out of her Lantus.  Cannot get refill until 12/23 per pharmacy.  Want to know what she can do since she only has a little of the meds left.  Please call number above.  Pt is deaf.  Lv msg if pt is not home. Initial call taken by: Abundio Miu,  June 16, 2010 11:00 AM  Follow-up for Phone Call        called pharmacy and patient recieved  Lantus insulin on 04/15/2010.  her insurance will not pay until 06/24/2010.  patient states she is taking 8 units daily   after calling pharmacy , attempted to call patient back no answer. message left. Follow-up by: Theresia Lo RN,  June 16, 2010 11:30 AM  Additional Follow-up for Phone Call Additional follow up Details #1::        message again left to return call. Additional Follow-up by: Theresia Lo RN,  June 17, 2010 8:28 AM    Additional Follow-up for Phone Call Additional follow up Details #2::    see next phone note. Follow-up by: Theresia Lo RN,  June 17, 2010 10:43 AM

## 2010-08-04 NOTE — Assessment & Plan Note (Signed)
Summary: F/U  Diabetes - Rx Clinic   Vital Signs:  Patient profile:   55 year old female Height:      63 inches Weight:      157 pounds BMI:     27.91 Pulse rate:   75 / minute BP sitting:   136 / 82  (left arm)  Primary Care Provider:  Lloyd Huger MD   History of Present Illness: Patient arrives in good spirit.  Accompanied by interpreter - Robynn Pane.  Patient brought all medications and blood glucose log. Patient brought in food diary. She is doing well with diet and exercise, although she has gained 3 pounds since her visit with her PCP. She complains of dizziness when her blood sugar is low.   AM Fasting numbers Range 100-220 Average Daily blood sugar estimated to be  ~150 A few high readings of >300 occurred during a two day period 1-6 and 1-7 Several at bedtime blood sugars were 80-90.         Current Medications (verified): 1)  Amlodipine Besylate 10 Mg Tabs (Amlodipine Besylate) .... Take 1 Tablet By Mouth Once A Day 2)  Bayer Childrens Aspirin 81 Mg Chew (Aspirin) .... Take 1 Tablet By Mouth Once A Day 3)  Glucophage 1000 Mg Tabs (Metformin Hcl) .Marland Kitchen.. 1 Tablet By Mouth Twice A Day 4)  Neurontin 300 Mg  Caps (Gabapentin) .Marland Kitchen.. 1 Pill 4x/day 5)  Lantus 100 Unit/ml Soln (Insulin Glargine) .... Inject 6 Units in The Am Prior To Breakfastl.   Dispense Qs X 69month 6)  Miralax   Powd (Polyethylene Glycol 3350) .Marland Kitchen.. 17g By Mouth Three Times Per Week As Needed For Constipation.  Mix W/ 8 Oz of Water.  Disp 1 Large Container 7)  Fluticasone Propionate 50 Mcg/act  Susp (Fluticasone Propionate) .... 2 Sprays Each Nostril Once Daily 8)  Lipitor 40 Mg Tabs (Atorvastatin Calcium) .Marland Kitchen.. 1 Tab By Mouth At Bedtime For Cholesterol 9)  Bd Insulin Syringe Ultrafine 30g X 1/2" 1 Ml Misc (Insulin Syringe-Needle U-100) .... Use For 5 Insulin Injections Daily.  Dispense One Month Supply.  Refill Prn 10)  Novolog 100 Unit/ml Soln (Insulin Aspart) .... Inject 2 Units Prior To Each Meal.  If  Pre-Meal Blood Glucose Is 150-250 Give 3 Units Total and If Blood Glucose Is >250 Give 4 Units Total. 11)  Losartan Potassium 100 Mg Tabs (Losartan Potassium) .... One Daily 12)  Metoprolol Tartrate 100 Mg Tabs (Metoprolol Tartrate) .... Take One Tablet Twice Daily 13)  Hydrochlorothiazide 25 Mg Tabs (Hydrochlorothiazide) .... Take One Half Tablet By Mouth Daily.  Allergies: 1)  ! Sulfa 2)  ! * Altace   Impression & Recommendations:  Problem # 1:  DIABETES MELLITUS, II, COMPLICATIONS (ICD-250.92)  Diabetes longstanding duration currently under: fair control of blood glucose based on A1C of:7.3  fasting CBGs of:100-220. Control is suboptimal due [id number]:XWRUE having low blood sugars, where she gets dizzy. Able to verbalize appropriate hypoglycemia management plan. Adjusted Lantus back to 6 units and continue with the novolog sliding scale. Patient's goal weight is 150. Plan is to continue diet and exercise with goal of returning to oral medications and cutting back on insulin.  Written pt instructions provided:   F/U Rx Clinic Visit: 6 weeks TTFFC:35  mins.  Pt seen with: Lyna Poser, Pharm.D., and Margot Chimes, Pharm.D. Candidate.   Her updated medication list for this problem includes:    Bayer Childrens Aspirin 81 Mg Chew (Aspirin) .Marland Kitchen... Take 1 tablet by mouth once  a day    Glucophage 1000 Mg Tabs (Metformin hcl) .Marland Kitchen... 1 tablet by mouth twice a day    Lantus 100 Unit/ml Soln (Insulin glargine) ..... Inject 6 units in the am prior to breakfastl.   dispense qs x 27month    Novolog 100 Unit/ml Soln (Insulin aspart) ..... Inject 2 units prior to each meal.  if pre-meal blood glucose is 150-250 give 3 units total and if blood glucose is >250 give 4 units total.    Losartan Potassium 100 Mg Tabs (Losartan potassium) ..... One daily  Orders: Reassessment Each 15 min unit- [facility] (04540)  Problem # 2:  HYPERTENSION, BENIGN SYSTEMIC (ICD-401.1) Assessment: Unchanged  Blood pressure today is  136/82 with goal of <130/80. Patient has been consistently hovering in the 130's systolic. Could titrate the HCTZ up to 25 mg.  Her updated medication list for this problem includes:    Amlodipine Besylate 10 Mg Tabs (Amlodipine besylate) .Marland Kitchen... Take 1 tablet by mouth once a day    Losartan Potassium 100 Mg Tabs (Losartan potassium) ..... One daily    Metoprolol Tartrate 100 Mg Tabs (Metoprolol tartrate) .Marland Kitchen... Take one tablet twice daily    Hydrochlorothiazide 25 Mg Tabs (Hydrochlorothiazide) .Marland Kitchen... Take one half tablet by mouth daily.  Orders: Reassessment Each 15 min unit- [facility] (98119)  Problem # 3:  HYPERCHOLESTEROLEMIA (ICD-272.0)  Patient is complaining of muscle pains and aches that have been occuring for awhile. Last LDL was 72 in 08/2008. Currently on lipitor 40 mg. Patient has lost significant amount of weight with diet and exercise. Need to obtain new lipid panel and CK. May need trial OFF statin and determine baseline CK and symptoms of leg pain prior to restarting new statin.   Her updated medication list for this problem includes:    Lipitor 40 Mg Tabs (Atorvastatin calcium) .Marland Kitchen... 1 tab by mouth at bedtime for cholesterol  [facility]  Complete Medication List: 1)  Amlodipine Besylate 10 Mg Tabs (Amlodipine besylate) .... Take 1 tablet by mouth once a day 2)  Bayer Childrens Aspirin 81 Mg Chew (Aspirin) .... Take 1 tablet by mouth once a day 3)  Glucophage 1000 Mg Tabs (Metformin hcl) .Marland Kitchen.. 1 tablet by mouth twice a day 4)  Neurontin 300 Mg Caps (Gabapentin) .Marland Kitchen.. 1 pill 4x/day 5)  Lantus 100 Unit/ml Soln (Insulin glargine) .... Inject 6 units in the am prior to breakfastl.   dispense qs x 27month 6)  Miralax Powd (Polyethylene glycol 3350) .Marland Kitchen.. 17g by mouth three times per week as needed for constipation.  mix w/ 8 oz of water.  disp 1 large container 7)  Fluticasone Propionate 50 Mcg/act Susp (Fluticasone propionate) .... 2 sprays each  nostril once daily 8)  Lipitor 40 Mg Tabs (Atorvastatin calcium) .Marland Kitchen.. 1 tab by mouth at bedtime for cholesterol 9)  Bd Insulin Syringe Ultrafine 30g X 1/2" 1 Ml Misc (Insulin syringe-needle u-100) .... Use for 5 insulin injections daily.  dispense one month supply.  refill prn 10)  Novolog 100 Unit/ml Soln (Insulin aspart) .... Inject 2 units prior to each meal.  if pre-meal blood glucose is 150-250 give 3 units total and if blood glucose is >250 give 4 units total. 11)  Losartan Potassium 100 Mg Tabs (Losartan potassium) .... One daily 12)  Metoprolol Tartrate 100 Mg Tabs (Metoprolol tartrate) .... Take one tablet twice daily 13)  Hydrochlorothiazide 25 Mg Tabs (Hydrochlorothiazide) .... Take one half tablet by mouth daily.  Patient  Instructions: 1)  Reduce your Lantus to 6 units each morning.  2)  Continue same dose of Novolog at this time.  3)  Continue to walk, hula-hoop and drinking lots of water.  4)  Dr. Cristal Ford - schedule visit today on your way out for 3 weeks.  5)  Next visit with Dr. Raymondo Band in early March.    Orders Added: 1)  Reassessment Each 15 min unit- [facility] [[id number]]    Prevention & Chronic Care Immunizations   Influenza vaccine: Fluvax Non-MCR  (05/13/2010)   Influenza vaccine due: 06/13/2008    Tetanus booster: 12/06/2003: Done.   Tetanus booster due: 12/05/2013    Pneumococcal vaccine: Done.  (04/03/2003)   Pneumococcal vaccine due: None  Colorectal Screening   Hemoccult: Not documented   Hemoccult due: Not Indicated    Colonoscopy: Results: Internal Hemorrhoids.     Location:  [facility].     (01/31/2008)   Colonoscopy action/deferral: Deferred  (02/21/2010)   Colonoscopy due: 01/30/2018  Other Screening   Pap smear: NEGATIVE FOR INTRAEPITHELIAL LESIONS OR MALIGNANCY.  (10/16/2008)   Pap smear action/deferral: Deferred-3 yr interval  (02/21/2010)   Pap smear due: 10/17/2011    Mammogram: No specific mammographic evidence of malignancy.     (10/12/2009)   Mammogram action/deferral: Deferred  (02/21/2010)   Mammogram due: 10/2010   Smoking status: never  (02/21/2010)  Diabetes Mellitus   HgbA1C: 7.3  (05/13/2010)   Hemoglobin A1C due: 03/12/2008    Eye exam: diabetic retinopathy  (02/07/2010)   Eye exam due: 02/08/2011    Foot exam: yes  (05/13/2010)   High risk foot: Not documented   Foot care education: Not documented   Foot exam due: 02/22/2011    Urine microalbumin/creatinine ratio: Not documented   Urine microalbumin action/deferral: Not indicated   Urine microalbumin/cr due: 10/15/2007    Diabetes flowsheet reviewed?: Yes   Progress toward A1C goal: At goal  Lipids   Total Cholesterol: 157  (08/20/2008)   Lipid panel action/deferral: LDL Direct ordered   LDL: 77  (08/20/2008)   LDL Direct: 75  (02/21/2010)   HDL: 69  (08/20/2008)   Triglycerides: 57  (08/20/2008)   Lipid panel due: 03/24/2010    SGOT (AST): 13  (05/13/2010)   SGPT (ALT): 13  (05/13/2010)   Alkaline phosphatase: 98  (05/13/2010)   Total bilirubin: 0.3  (05/13/2010)    Lipid flowsheet reviewed?: Yes   Progress toward LDL goal: At goal  Hypertension   Last Blood Pressure: 136 / 82  (07/19/2010)   Serum creatinine: 0.52  (05/13/2010)   Serum potassium 3.9  (05/13/2010)   Basic metabolic panel due: 03/24/2010    Hypertension flowsheet reviewed?: Yes   Progress toward BP goal: Unchanged  Self-Management Support :   Personal Goals (by the next clinic visit) :     Personal A1C goal: 8  (04/22/2009)     Personal blood pressure goal: 130/80  (04/22/2009)     Personal LDL goal: 100  (04/22/2009)    Diabetes self-management support: CBG self-monitoring log, Written self-care plan  (10/12/2009)    Diabetes self-management support not done because: Good outcomes  (04/22/2009)    Hypertension self-management support: Written self-care plan  (04/15/2010)    Hypertension self-management support not done because: Good outcomes   (10/29/2009)    Lipid self-management support: Not documented     Lipid self-management support not done because: Good outcomes  (10/29/2009)

## 2010-08-04 NOTE — Progress Notes (Signed)
  Phone Note Call from Patient   Caller: Daughter-Kaylee Call For: [phone] Summary of Call: Please call her daughter back at the above number Initial call taken by: Abundio Miu,  June 17, 2010 10:28 AM  Follow-up for Phone Call        there has been a problem with the video relay and patient has not been able to call back in to our office so her daughter is calling for her. explained to daughter that I had called pharmacy and she received a vial of lantus on 04/15/2010. if she is taking 8 units a day this should last her for at least 3 months . Insurance will not pay for it again until 06/24/2010. explained to daughter that she will need to pay out of pocket for the insulin. that even if we sent in a new Rx now insurnace still will not pay until 06/24/2010.  explained that we do not have samples either at this time to give her. duaghter voices understanding. Follow-up by: Theresia Lo RN,  June 17, 2010 10:41 AM     Appended Document:  we actually do have a sample of the  lantus pen. called daughter and she states patient has already left for work . advised to come in first thing  Monday AM and Dr. Raymondo Band will instruct her in using  lantus pen to get her thru until  insurance  will pay for insulin. daughter states she still has a small amount  of insulin left in vial..

## 2010-08-04 NOTE — Letter (Signed)
Summary: Generic Letter  [facility]  [address]   [place], [place] [address]   Phone: [phone]  Fax: [phone]    07/19/2010  Alicia [name] [address] [place], [place]  [address]  Dear [name],  Thank you for bringing your food log into clinic. You are doing a great job being proactive with your health! Your recent lab studies were normal, except your vitamin D level was low. You should start taking Calcium and Vitamin D supplements which you can find at the drug store. I would recommend at least 600mg  Vitamin D daily. If you have any questions, please do not hesitate to contact me at clinic. Please schedule a visit in one month. Have a great day!  Sincerely,   Lloyd Huger MD  Appended Document: Generic Letter mailed

## 2010-08-04 NOTE — Assessment & Plan Note (Signed)
Summary: diabetes follow up and elevated blood sugars/ls   Vital Signs:  Patient profile:   55 year old female Height:      63 inches Weight:      154 pounds BMI:     27.38 Pulse rate:   96 / minute BP sitting:   139 / 79  (left arm)  Vitals Entered By: Arlyss Repress CMA, (July 14, 2010 3:39 PM) CC: discuss high glucose. see list. Is Patient Diabetic? Yes Pain Assessment Patient in pain? no        Primary Provider:  Lloyd Huger MD  CC:  discuss high glucose. see list..  History of Present Illness: 1. Diabetes. Blood sugars have been elevated for past few weeks. Brings a log of QID CBGs. Fasting range from 84-250 with most values 120-200. PP values are generally higher, but there lowest is 80s and highest is 420s. These are higher than usual, most likely due to the recent decrease in lantus to 8 units and the implementation of new device. She did not bring the pen to demonstrate, but had a nursing/pharm visit for teaching recently. Still taking Novolog 4 units with meals and metformin 1000mg  two times a day. Last A1c was 7.3 in November.   2. HTN: Patient states that her BP is usually 110-120 at home, but does not write this down. For past several months has been over goal by manual readings in clinic. Was taken off ACEi/HCTZ combo due to cough, never restarted HCTZ. Plan was to start HCTZ after last clinic visit, but this was not done due to failed communication.   3. Fall. Complaining of some general muscle aches and fatigue over past several months. Had one fall after losing balance and landed on her side. She was seen at Urgent care with no fractures. Has been going to PT for past few weeks and some improvement. Pain from this fall is improved. Still able to exercise daily.    Allergies: 1)  ! Sulfa 2)  ! * Altace  Past History:  Past Medical History: Last updated: 12/28/2009  5/06 [name]- 5.8cm ant body uterine fibroid s/p embolization,  anisocoria since childhood,  [place] Deafness- 55yo meningitis,  diabetic gastroparesis, diabetic retinopathy, has had ha assessments, including MRI- unrevealing HTN diabetes with fluctuating sugars HLD  Past Surgical History: Last updated: 10/16/2008 12/06 Uterine fibroids embolization - 10/12/2005 2-D Echo - nl 5/04 due to murmur - 11/20/2002 Appendectomy  cardiolite-EF77%no ischemia - 04/06/2005 cesarian section x2  ETT -intermediate Risk significant CAD-McDiarmid - 02/27/2005 Tubal ligation   Family History: Last updated: 10/16/2008 HTN mother +ovarian CA grandmother mother and sister have uterine fibroids no CAD/MI/DM, sudden death, no hypercholesterolemia aunt has "bone cancer"  Social History: Last updated: 10/16/2008 married lives w/ husband and three children; Husband is deaf also, children are not; Works in day care since 2000; No tobacco,occ  EtOH, rec drugs  Risk Factors: Exercise: no (10/15/2006)  Risk Factors: Smoking Status: never (02/21/2010)  Review of Systems  The patient denies anorexia, fever, chest pain, syncope, dyspnea on exertion, peripheral edema, prolonged cough, and headaches.         Complains of bruising easily and some muscle soreness and weakness after starting exercise program.   Physical Exam  General:  Well-developed,well-nourished,in no acute distress; alert,appropriate and cooperative throughout examination Head:  normocephalic and atraumatic.   Eyes:  No corneal or conjunctival inflammation noted. EOMI. Perrla. Vision grossly normal. Mouth:  Oral mucosa and oropharynx without lesions or exudates.  Teeth  in good repair. Lungs:  Normal respiratory effort, chest expands symmetrically. Lungs are clear to auscultation, no crackles or wheezes. Heart:  I/VI systolic murmur. No radiation to carotids. RRR.  Abdomen:  Bowel sounds positive,abdomen soft and non-tender without masses, organomegaly or hernias noted. Msk:  No deformity or scoliosis noted of thoracic or lumbar  spine.   Extremities:  No clubbing, cyanosis, edema, or deformity noted with normal full range of motion of all joints.   Neurologic:  No cranial nerve deficits noted. Station and gait are normal. Sensory, motor and coordinative functions appear intact. Skin:  Intact without suspicious lesions or rashes Psych:  Cognition and judgment appear intact. Alert and cooperative with normal attention span and concentration. No apparent delusions, illusions, hallucinations   Impression & Recommendations:  Problem # 1:  DIABETES MELLITUS, II, COMPLICATIONS (ICD-250.92) Control has been relaxed somewhat with the goal of avoiding future hypoglycemic events. Given the inconsistent values, it is possible that diet is causing swings of hyperglycemia vs. hypoglycemia, or perhaps medication noncompliance with new device. Gave patient empty food diary to log her diet for 2-3 days. Recommended she present this information to Dr. Raymondo Band at next Pharm visit, scheduled for next week already. A1c will be due at next office visit.   Her updated medication list for this problem includes:    Bayer Childrens Aspirin 81 Mg Chew (Aspirin) .Marland Kitchen... Take 1 tablet by mouth once a day    Glucophage 1000 Mg Tabs (Metformin hcl) .Marland Kitchen... 1 tablet by mouth twice a day    Lantus 100 Unit/ml Soln (Insulin glargine) ..... Inject 8 units in the am prior to breakfastl.   dispense qs x 29month    Novolog 100 Unit/ml Soln (Insulin aspart) ..... Inject 2 units prior to each meal.  if pre-meal blood glucose is 150-250 give 3 units total and if blood glucose is >250 give 4 units total.    Losartan Potassium 100 Mg Tabs (Losartan potassium) ..... One daily  Orders: [facility]- Est  Level 4 (82956)  Problem # 2:  HYPERTENSION, BENIGN SYSTEMIC (ICD-401.1) Restart HCTZ at 12.5mg  daily for better control, goal <125/80. Not taking since stopped the ACEi. Patient brings in an old bottle of this medication, but needs refills. Asked her to start writing down BP  measurements at least every other day.   Her updated medication list for this problem includes:    Amlodipine Besylate 10 Mg Tabs (Amlodipine besylate) .Marland Kitchen... Take 1 tablet by mouth once a day    Losartan Potassium 100 Mg Tabs (Losartan potassium) ..... One daily    Metoprolol Tartrate 100 Mg Tabs (Metoprolol tartrate) .Marland Kitchen... Take one tablet twice daily    Hydrochlorothiazide 25 Mg Tabs (Hydrochlorothiazide) .Marland Kitchen... Take one half tablet by mouth daily.  Orders: CBC-[facility] (21308) [facility]- Est  Level 4 (65784)  Problem # 3:  MUSCLE PAIN (ICD-729.1) Will check screening labs to rule out organic cause for muscle pain, weakness and CK since she takes statin chronically. This seems to correlate with her beginning of exercise routine and it has not caused her to change daily habits. May consider work-up for more insidious etiology including myositis or myopathy if this worsens or more systemic symptoms present. Less likely a neuropathic type pain given her history of diabetes.   Her updated medication list for this problem includes:    Bayer Childrens Aspirin 81 Mg Chew (Aspirin) .Marland Kitchen... Take 1 tablet by mouth once a day  Orders: TSH-[facility] (69629-52841) Vit D, 25 OH-[facility] (32440-10272) CK (Creatine Kinase)-[facility] (  [id number]) [facility]- Est  Level 4 (32951)  Problem # 4:  LEG PAIN, RIGHT (ICD-729.5) Will continue going to physical therapy s/p fall. Encouraged continue activities and exercise as tolerated. Tylenol as needed.  Complete Medication List: 1)  Amlodipine Besylate 10 Mg Tabs (Amlodipine besylate) .... Take 1 tablet by mouth once a day 2)  Bayer Childrens Aspirin 81 Mg Chew (Aspirin) .... Take 1 tablet by mouth once a day 3)  Glucophage 1000 Mg Tabs (Metformin hcl) .Marland Kitchen.. 1 tablet by mouth twice a day 4)  Neurontin 300 Mg Caps (Gabapentin) .Marland Kitchen.. 1 pill 4x/day 5)  Lantus 100 Unit/ml Soln (Insulin glargine) .... Inject 8 units in the am prior to breakfastl.   dispense qs x 31month 6)  Miralax Powd (Polyethylene  glycol 3350) .Marland Kitchen.. 17g by mouth three times per week as needed for constipation.  mix w/ 8 oz of water.  disp 1 large container 7)  Fluticasone Propionate 50 Mcg/act Susp (Fluticasone propionate) .... 2 sprays each nostril once daily 8)  Lipitor 40 Mg Tabs (Atorvastatin calcium) .Marland Kitchen.. 1 tab by mouth at bedtime for cholesterol 9)  Bd Insulin Syringe Ultrafine 30g X 1/2" 1 Ml Misc (Insulin syringe-needle u-100) .... Use for 5 insulin injections daily.  dispense one month supply.  refill prn 10)  Novolog 100 Unit/ml Soln (Insulin aspart) .... Inject 2 units prior to each meal.  if pre-meal blood glucose is 150-250 give 3 units total and if blood glucose is >250 give 4 units total. 11)  Losartan Potassium 100 Mg Tabs (Losartan potassium) .... One daily 12)  Metoprolol Tartrate 100 Mg Tabs (Metoprolol tartrate) .... Take one tablet twice daily 13)  Hydrochlorothiazide 25 Mg Tabs (Hydrochlorothiazide) .... Take one half tablet by mouth daily.  Patient Instructions: 1)  Nice to see you. 2)  Your medicines have been refilled and sent to pharmacy ([facility]). 3)  Please keep track of everything you eat on this food log. 4)  Show Dr. Raymondo Band the food log. Make an appointment in next 1-3 weeks. 5)  I will call you if your lab tests are abnormal. 6)  Continue taking the same insulin you are on. Prescriptions: HYDROCHLOROTHIAZIDE 25 MG TABS (HYDROCHLOROTHIAZIDE) take one half tablet by mouth daily.  #30 x 6   Entered and Authorized by:   Lloyd Huger MD   Signed by:   Lloyd Huger MD on 07/14/2010   Method used:   Electronically to        [facility]  [address] [id number] ([facility]ail)       [address]       [place], [place]  [address]       Ph: [phone] or [phone]       Fax: [phone]   RxID:   [id number] MIRALAX   POWD (POLYETHYLENE GLYCOL 3350) 17g by mouth Three times per week as needed for constipation.  Mix w/ 8 oz of water.  Disp 1 large container  #1 x 3   Entered and Authorized  by:   Lloyd Huger MD   Signed by:   Lloyd Huger MD on 07/14/2010   Method used:   [facility]ically to        [facility]  [address] [id number] ([facility]       [address]       [place], [place]  [address]       Ph: [phone] or [phone]  Fax: [phone]   RxID:   [id number] LIPITOR 40 MG TABS (ATORVASTATIN CALCIUM) 1 tab by mouth at bedtime for cholesterol  #31 x 6   Entered and Authorized by:   Lloyd Huger MD   Signed by:   Lloyd Huger MD on 07/14/2010   Method used:   [facility]ronically to        [facility]  [address] [id number] ([facility])       [address]       [place], [place]  [address]       Ph: [phone] or [phone]       Fax: [phone]   RxID:   [id number] GLUCOPHAGE 1000 MG TABS (METFORMIN HCL) 1 tablet by mouth twice a day  #60 Tablet x 6   Entered and Authorized by:   Lloyd Huger MD   Signed by:   Lloyd Huger MD on 07/14/2010   Method used:   [facility]ronically to        [facility]  [address] [id number] ([facility]       [address]       [place], [place]  [address]       Ph: [phone] or [phone]       Fax: [phone]   RxID:   [id number] NEURONTIN 300 MG  CAPS (GABAPENTIN) 1 pill 4x/day  #120 Capsule x 6   Entered and Authorized by:   Lloyd Huger MD   Signed by:   Lloyd Huger MD on 07/14/2010   Method used:   [facility]ronically to        [facility]  [address] [id number] ([facility]       [address]       [place], [place]  [address]       Ph: [phone] or [phone]       Fax: [phone]   RxID:   [id number]    Orders Added: 1)  CBC-[facility] [85027] 2)  TSH-[facility] [08676-19509] 3)  Vit D, 25 OH-[facility] [phone] 4)  CK (Creatine Kinase)-[facility] [82550-23250] 5)  [facility]- Est  Level 4 [99833]

## 2010-08-26 ENCOUNTER — Other Ambulatory Visit: Payer: Self-pay | Admitting: Urology

## 2010-08-26 ENCOUNTER — Ambulatory Visit (INDEPENDENT_AMBULATORY_CARE_PROVIDER_SITE_OTHER): Payer: Medicare (Managed Care) | Admitting: Family Medicine

## 2010-08-26 ENCOUNTER — Encounter: Payer: Self-pay | Admitting: Family Medicine

## 2010-08-26 VITALS — BP 100/60 | HR 81 | Temp 98.4°F | Wt 146.6 lb

## 2010-08-26 DIAGNOSIS — M2550 Pain in unspecified joint: Secondary | ICD-10-CM

## 2010-08-26 DIAGNOSIS — E7800 Pure hypercholesterolemia, unspecified: Secondary | ICD-10-CM

## 2010-08-26 DIAGNOSIS — IMO0002 Reserved for concepts with insufficient information to code with codable children: Secondary | ICD-10-CM

## 2010-08-26 DIAGNOSIS — I10 Essential (primary) hypertension: Secondary | ICD-10-CM

## 2010-08-26 DIAGNOSIS — E1165 Type 2 diabetes mellitus with hyperglycemia: Secondary | ICD-10-CM

## 2010-08-26 DIAGNOSIS — E669 Obesity, unspecified: Secondary | ICD-10-CM

## 2010-08-26 DIAGNOSIS — E118 Type 2 diabetes mellitus with unspecified complications: Secondary | ICD-10-CM

## 2010-08-26 DIAGNOSIS — IMO0001 Reserved for inherently not codable concepts without codable children: Secondary | ICD-10-CM

## 2010-08-26 LAB — POCT GLYCOSYLATED HEMOGLOBIN (HGB A1C): Hemoglobin A1C: 7.6

## 2010-08-26 NOTE — Patient Instructions (Signed)
Nice to see you. I will send you a letter if your labs are abnormal. Make sure to drink plenty of liquids. Stop taking your lipitor. This may help your muscle aches. Decrease your Metoprolol to 50mg  twice daily. Make an appointment in 1-2 months with Dr Cristal Ford. F/u with Dr. Raymondo Band in 2-4 weeks.

## 2010-08-27 LAB — IRON AND TIBC
%SAT: 9 % — ABNORMAL LOW (ref 20–55)
Iron: 26 ug/dL — ABNORMAL LOW (ref 42–145)
TIBC: 281 ug/dL (ref 250–470)
UIBC: 255 ug/dL

## 2010-08-27 LAB — CONVERTED CEMR LAB
BUN: 21 mg/dL (ref 6–23)
CO2: 21 meq/L (ref 19–32)
Calcium: 10.1 mg/dL (ref 8.4–10.5)
Chloride: 95 meq/L — ABNORMAL LOW (ref 96–112)
Creatinine, Ser: 0.95 mg/dL (ref 0.40–1.20)
Ferritin: 80 ng/mL (ref 10–291)
Glucose, Bld: 385 mg/dL — ABNORMAL HIGH (ref 70–99)
Iron: 26 ug/dL — ABNORMAL LOW (ref 42–145)
Potassium: 3.8 meq/L (ref 3.5–5.3)
Saturation Ratios: 9 % — ABNORMAL LOW (ref 20–55)
Sed Rate: 5 mm/hr (ref 0–22)
Sodium: 134 meq/L — ABNORMAL LOW (ref 135–145)
TIBC: 281 ug/dL (ref 250–470)
TSH: 1.058 microintl units/mL (ref 0.350–4.500)
UIBC: 255 ug/dL

## 2010-08-27 LAB — SEDIMENTATION RATE: Sed Rate: 5 mm/hr (ref 0–22)

## 2010-08-27 LAB — BASIC METABOLIC PANEL
BUN: 21 mg/dL (ref 6–23)
CO2: 21 mEq/L (ref 19–32)
Calcium: 10.1 mg/dL (ref 8.4–10.5)
Chloride: 95 mEq/L — ABNORMAL LOW (ref 96–112)
Creat: 0.95 mg/dL (ref 0.40–1.20)
Glucose, Bld: 385 mg/dL — ABNORMAL HIGH (ref 70–99)
Potassium: 3.8 mEq/L (ref 3.5–5.3)
Sodium: 134 mEq/L — ABNORMAL LOW (ref 135–145)

## 2010-08-27 LAB — TSH: TSH: 1.058 u[IU]/mL (ref 0.350–4.500)

## 2010-08-27 LAB — FERRITIN: Ferritin: 80 ng/mL (ref 10–291)

## 2010-08-28 ENCOUNTER — Encounter: Payer: Self-pay | Admitting: Family Medicine

## 2010-08-28 NOTE — Assessment & Plan Note (Addendum)
Symptoms persist and are bothersome to patient. CK was normal-94- in 1/12.  Will check labs (iron panel, CK, ESR) to eval secondary causes. Perhaps statin effect vs. Inflammatory condition. Has borderline hypercalcemia, may consider adding PTH, although less likely cause. Will DC statin and assess for improvement in symptoms.

## 2010-08-28 NOTE — Assessment & Plan Note (Signed)
Widely varying blood sugars in setting of acute illness. A1c is up slightly to 7.6 from 7.3, but with less insulin. Weight loss is helping. Goal is to avoid hypoglycemic episodes. Will keep lantus at 6 units currently, with novolog sliding scale. Follow up with Dr. Raymondo Band in next 1-2 weeks.

## 2010-08-28 NOTE — Assessment & Plan Note (Signed)
At goal weight, and patient is happy. Has great success with diet and exercise, barely above 'overweight' BMI value (25.9). Advised to maintain healthy active lifestyle. Patient agrees that maintaining current weight is now the goal.

## 2010-08-28 NOTE — Assessment & Plan Note (Signed)
Last CK was normal, but will stop statin and assess for improvement in myalgias. Consider restarting therapy in future. Last LDL was at goal, and perhaps weight loss is adequate to allow non-pharmacologic treatment. FLP at next visit.

## 2010-08-28 NOTE — Assessment & Plan Note (Signed)
With weight loss, BP is now borderline hypotensive.  Will decrease metoprolol to half dose (50mg  BID). Consider stopping this or norvasc if pressure is still low at next visit. Perhaps relative dehydration is also confounding with recent GI illness. Encouraged plenty of fluids.

## 2010-08-28 NOTE — Progress Notes (Signed)
  Subjective:    Patient ID: Alicia [name], female    DOB: [birth year], 55 y.o.   MRN: [id number]  HPI 1. DM. Has been taking lantus 6 units nightly (titrating down by Dr. Raymondo Band). Novolog sliding scale taking 2-3-4 units at meal times.Taking metformin as prescribed. Last A1c was 7.3, today is 7.6 with decreased insulin dosing. AM CBGs have been in 200s this week, while qhs have ranged from 100-150.   2. Vomitting/diarrhea. For few days earlier this week had some n/v, then started diarrhea. Continued her lantus, but held her novolog. Is feeling better and keeping down liquids and some solids now (crackers and gatorade). Despite low intake, CBGs still 250-300 range in am.  3. HTN. Difficult to control in the past. Has intentionally lost almost 10 lbs since last visit. Borderline hypotensive today and had 100/58 in the pharmacy few days ago. Taking 4 anti-hypertensives as prescribed. Has chronic fatigue, denies dizzyness or syncope.   4. Myalgias. Feels this is worsening over past few months. Aches in shoulders and hips, muscles. Feels worse in the morning and at night-time. Is still exercising. Denies weakness, falls. Initial CBC, CK was wnl at last check.   Review of Systems Positive for generalized fatigue, palpitations this week (associated with hyperglycemia). Denies chest pain, dyspnea, LE edema, dysuria, hematuria.     Objective:   Physical Exam  Constitutional: She is oriented to person, place, and time. She appears well-developed and well-nourished. No distress.  HENT:  Head: Normocephalic and atraumatic.  Mouth/Throat: Oropharynx is clear and moist. No oropharyngeal exudate.  Eyes: EOM are normal. Pupils are equal, round, and reactive to light.  Neck: No thyromegaly present.  Cardiovascular: Normal rate, regular rhythm and intact distal pulses.  Exam reveals no gallop and no friction rub.   No murmur heard. Pulmonary/Chest: Effort normal and breath sounds normal. No respiratory  distress.  Abdominal: Soft. Bowel sounds are normal. She exhibits no distension. There is no tenderness.  Musculoskeletal: Normal range of motion. She exhibits no edema and no tenderness.  Neurological: She is alert and oriented to person, place, and time. No cranial nerve deficit. Coordination normal.  Skin: Skin is warm.  Psychiatric: She has a normal mood and affect. Her behavior is normal. Thought content normal.          Assessment & Plan:

## 2010-08-29 LAB — CK: Total CK: 36 U/L (ref 7–177)

## 2010-08-29 LAB — CONVERTED CEMR LAB: Total CK: 36 units/L (ref 7–177)

## 2010-08-31 ENCOUNTER — Telehealth: Payer: Self-pay | Admitting: *Deleted

## 2010-08-31 NOTE — Telephone Encounter (Signed)
Patient calls thru Video Relay stating last week she had a virus. GI upset. Now last night she developed upper back discomfort and discomfort in right chest. Has progressively gotten worse. No cough.  No nausea. Advised that we do not have available appointment  in our ofice today but  advised she go to urgent care for evaluation.she voices understanding.

## 2010-09-01 ENCOUNTER — Encounter: Payer: Self-pay | Admitting: Family Medicine

## 2010-09-01 ENCOUNTER — Telehealth: Payer: Self-pay | Admitting: Family Medicine

## 2010-09-01 NOTE — Telephone Encounter (Signed)
Called patient with intention to assess her GI complaint. I see no visits to urgent care, clinic, or ED. Recent episodes of both hypoglycemia and hyperglycemia may be contributing to symptoms, but unsure of her recent blood sugars since our last visit. Also wanted to inform patient her lab tests were normal except for hyperglycemia and iron deficiency, and recommend she start iron sulfate.

## 2010-09-02 ENCOUNTER — Telehealth: Payer: Self-pay | Admitting: *Deleted

## 2010-09-02 DIAGNOSIS — E118 Type 2 diabetes mellitus with unspecified complications: Secondary | ICD-10-CM

## 2010-09-02 DIAGNOSIS — E1165 Type 2 diabetes mellitus with hyperglycemia: Secondary | ICD-10-CM

## 2010-09-02 MED ORDER — INSULIN GLARGINE 100 UNIT/ML ~~LOC~~ SOLN: SUBCUTANEOUS | Status: DC

## 2010-09-02 NOTE — Telephone Encounter (Signed)
Consulted with Dr. Sheffield Slider and he advises for patient to increase Lantus to 8 units daily.  Verified with patient that she is feeling well today . No problem.  Upper back and chest discomfort went away.  No urinary symptoms. Advised her to call back next week to report BS readings.

## 2010-09-02 NOTE — Assessment & Plan Note (Addendum)
Patients blood sugars are running in the 275 - 300 range. Will increase Lantus to 10 units daily and ask nurse to insure that she is not having uti or other infection symptoms

## 2010-09-02 NOTE — Telephone Encounter (Signed)
Patient calls reporting blood sugars have been running higher recently . Yesterday AM fasting  was 365, today 293. Yesterday before lunch 275, before supper 275 and at bedtime 285. She takes Lantus 6 units in the AM and Novolog per sliding scale. Has an appointment scheduled with Dr. Raymondo Band 09/13/2010 and with Dr. Cristal Ford 09/30/2010.

## 2010-09-06 ENCOUNTER — Emergency Department (HOSPITAL_COMMUNITY)
Admission: EM | Admit: 2010-09-06 | Discharge: 2010-09-06 | Disposition: A | Discharge status: Home / Self Care | Payer: Medicare (Managed Care) | Attending: Emergency Medicine | Admitting: Emergency Medicine

## 2010-09-06 ENCOUNTER — Telehealth: Payer: Self-pay | Admitting: *Deleted

## 2010-09-06 ENCOUNTER — Inpatient Hospital Stay (INDEPENDENT_AMBULATORY_CARE_PROVIDER_SITE_OTHER)
Admission: RE | Admit: 2010-09-06 | Discharge: 2010-09-06 | Disposition: A | Discharge status: Home / Self Care | Payer: Medicare (Managed Care) | Source: Ambulatory Visit

## 2010-09-06 ENCOUNTER — Emergency Department (HOSPITAL_COMMUNITY): Payer: Medicare (Managed Care)

## 2010-09-06 ENCOUNTER — Encounter (HOSPITAL_COMMUNITY): Payer: Self-pay | Admitting: Radiology

## 2010-09-06 ENCOUNTER — Other Ambulatory Visit: Payer: Self-pay | Admitting: Family Medicine

## 2010-09-06 DIAGNOSIS — R109 Unspecified abdominal pain: Secondary | ICD-10-CM | POA: Insufficient documentation

## 2010-09-06 DIAGNOSIS — Z79899 Other long term (current) drug therapy: Secondary | ICD-10-CM | POA: Insufficient documentation

## 2010-09-06 DIAGNOSIS — R197 Diarrhea, unspecified: Secondary | ICD-10-CM | POA: Insufficient documentation

## 2010-09-06 DIAGNOSIS — Q619 Cystic kidney disease, unspecified: Secondary | ICD-10-CM | POA: Insufficient documentation

## 2010-09-06 DIAGNOSIS — E119 Type 2 diabetes mellitus without complications: Secondary | ICD-10-CM | POA: Insufficient documentation

## 2010-09-06 DIAGNOSIS — E785 Hyperlipidemia, unspecified: Secondary | ICD-10-CM | POA: Insufficient documentation

## 2010-09-06 DIAGNOSIS — Z1231 Encounter for screening mammogram for malignant neoplasm of breast: Secondary | ICD-10-CM

## 2010-09-06 DIAGNOSIS — I10 Essential (primary) hypertension: Secondary | ICD-10-CM | POA: Insufficient documentation

## 2010-09-06 DIAGNOSIS — Z7982 Long term (current) use of aspirin: Secondary | ICD-10-CM | POA: Insufficient documentation

## 2010-09-06 DIAGNOSIS — R112 Nausea with vomiting, unspecified: Secondary | ICD-10-CM | POA: Insufficient documentation

## 2010-09-06 DIAGNOSIS — Z794 Long term (current) use of insulin: Secondary | ICD-10-CM | POA: Insufficient documentation

## 2010-09-06 DIAGNOSIS — H9190 Unspecified hearing loss, unspecified ear: Secondary | ICD-10-CM | POA: Insufficient documentation

## 2010-09-06 HISTORY — DX: History of uterine scar from previous surgery: Z98.891

## 2010-09-06 HISTORY — DX: Unspecified hearing loss, unspecified ear: H91.90

## 2010-09-06 HISTORY — DX: Acquired absence of other specified parts of digestive tract: Z90.49

## 2010-09-06 LAB — BASIC METABOLIC PANEL
BUN: 10 mg/dL (ref 6–23)
CO2: 32 mEq/L (ref 19–32)
Calcium: 10.4 mg/dL (ref 8.4–10.5)
Chloride: 100 mEq/L (ref 96–112)
Creatinine, Ser: 0.52 mg/dL (ref 0.4–1.2)
GFR calc Af Amer: >60 mL/min (ref >60)
GFR calc non Af Amer: >60 mL/min (ref >60)
Glucose, Bld: 247 mg/dL — ABNORMAL HIGH (ref 70–99)
Potassium: 3.6 mEq/L (ref 3.5–5.1)
Sodium: 138 mEq/L (ref 135–145)

## 2010-09-06 LAB — HEPATIC FUNCTION PANEL
ALT: 17 U/L (ref 0–35)
AST: 18 U/L (ref 0–37)
Albumin: 3.9 g/dL (ref 3.5–5.2)
Alkaline Phosphatase: 85 U/L (ref 39–117)
Bilirubin, Direct: <0.1 mg/dL (ref 0.0–0.3)
Total Bilirubin: 0.3 mg/dL (ref 0.3–1.2)
Total Protein: 7.4 g/dL (ref 6.0–8.3)

## 2010-09-06 LAB — DIFFERENTIAL
Basophils Absolute: 0 10*3/uL (ref 0.0–0.1)
Basophils Relative: 0 % (ref 0–1)
Eosinophils Absolute: 0.1 10*3/uL (ref 0.0–0.7)
Eosinophils Relative: 2 % (ref 0–5)
Lymphocytes Relative: 30 % (ref 12–46)
Lymphs Abs: 2.4 10*3/uL (ref 0.7–4.0)
Monocytes Absolute: 0.5 10*3/uL (ref 0.1–1.0)
Monocytes Relative: 6 % (ref 3–12)
Neutro Abs: 4.8 10*3/uL (ref 1.7–7.7)
Neutrophils Relative %: 61 % (ref 43–77)

## 2010-09-06 LAB — CBC
HCT: 37.1 % (ref 36.0–46.0)
Hemoglobin: 12.1 g/dL (ref 12.0–15.0)
MCH: 28.7 pg (ref 26.0–34.0)
MCHC: 32.6 g/dL (ref 30.0–36.0)
MCV: 87.9 fL (ref 78.0–100.0)
Platelets: 446 10*3/uL — ABNORMAL HIGH (ref 150–400)
RBC: 4.22 MIL/uL (ref 3.87–5.11)
RDW: 13.6 % (ref 11.5–15.5)
WBC: 7.9 10*3/uL (ref 4.0–10.5)

## 2010-09-06 LAB — POCT PREGNANCY, URINE: Preg Test, Ur: NEGATIVE

## 2010-09-06 NOTE — Telephone Encounter (Signed)
Patient calls stating per video relay on 09/03/2010 she developed discomfort in left lower lower back and side and had diarrhea .   Was better on 09/04/2010. Yesterday had pain again in same area along with diarrhea.  Today she is having pain again. Denies any problem with urination. Denies fever.   blood sugar yesterday AM 168, today 333.Marland Kitchen    advised patient that since this has been going on for several days now  she should go to urgent care to be evaluated today. We have no available appointment here today. Patient voices acknowlegement of  understanding thru video relay and states she will go.Marland Kitchen

## 2010-09-07 ENCOUNTER — Telehealth: Payer: Self-pay | Admitting: Family Medicine

## 2010-09-07 LAB — POCT URINALYSIS DIPSTICK
Bilirubin Urine: NEGATIVE
Glucose, UA: >=1000 mg/dL — AB
Ketones, ur: NEGATIVE mg/dL
Nitrite: NEGATIVE
Protein, ur: NEGATIVE mg/dL
Specific Gravity, Urine: 1.015 (ref 1.005–1.030)
Urobilinogen, UA: 0.2 mg/dL (ref 0.0–1.0)
pH: 7 (ref 5.0–8.0)

## 2010-09-07 LAB — POCT I-STAT, CHEM 8
BUN: 12 mg/dL (ref 6–23)
Calcium, Ion: 1.23 mmol/L (ref 1.12–1.32)
Chloride: 100 mEq/L (ref 96–112)
Creatinine, Ser: 0.7 mg/dL (ref 0.4–1.2)
Glucose, Bld: 276 mg/dL — ABNORMAL HIGH (ref 70–99)
HCT: 39 % (ref 36.0–46.0)
Hemoglobin: 13.3 g/dL (ref 12.0–15.0)
Potassium: 3.5 mEq/L (ref 3.5–5.1)
Sodium: 137 mEq/L (ref 135–145)
TCO2: 27 mmol/L (ref 0–100)

## 2010-09-07 LAB — URINE CULTURE
Colony Count: 9000
Culture  Setup Time: 201203061858

## 2010-09-07 NOTE — Telephone Encounter (Signed)
Pt asking to speak with RN about her visit at urgent care, wants to give an update. When calling this will be a video relay call as the pt is hearing impaired.

## 2010-09-07 NOTE — Telephone Encounter (Signed)
Patient states per video relay that she went to Urgent Care first and then was sent to ER for CT scan . It was finally determined that she has a  left renal cyst and she will need follow up regarding this. Since Dr. Cristal Ford has no available appointment soon, appointment is scheduled with Dr. Louanne Belton for 09/12/2010 to follow up and discuss further tetsing or referral  that may be needed .

## 2010-09-12 ENCOUNTER — Ambulatory Visit (INDEPENDENT_AMBULATORY_CARE_PROVIDER_SITE_OTHER): Payer: Medicare (Managed Care) | Admitting: Family Medicine

## 2010-09-12 ENCOUNTER — Encounter: Payer: Self-pay | Admitting: *Deleted

## 2010-09-12 ENCOUNTER — Encounter: Payer: Self-pay | Admitting: Family Medicine

## 2010-09-12 VITALS — BP 115/70 | HR 75 | Temp 98.4°F | Wt 146.6 lb

## 2010-09-12 DIAGNOSIS — N289 Disorder of kidney and ureter, unspecified: Secondary | ICD-10-CM

## 2010-09-12 NOTE — Patient Instructions (Addendum)
It was great to see you today! I see nothing worrisome in my exam of your back or abdomen. We will schedule a CT scan of your abdomen with contrast.  Hopefully you can get it done today.  Keep your scheduled appointment for your diabetes and you will be able to talk about the results of the study.  Our nurses will tell you more about where and when to show up for the study.

## 2010-09-13 ENCOUNTER — Encounter: Payer: Self-pay | Admitting: Pharmacist

## 2010-09-13 ENCOUNTER — Ambulatory Visit (INDEPENDENT_AMBULATORY_CARE_PROVIDER_SITE_OTHER): Payer: Medicare (Managed Care) | Admitting: Pharmacist

## 2010-09-13 DIAGNOSIS — E7800 Pure hypercholesterolemia, unspecified: Secondary | ICD-10-CM

## 2010-09-13 DIAGNOSIS — E118 Type 2 diabetes mellitus with unspecified complications: Secondary | ICD-10-CM

## 2010-09-13 DIAGNOSIS — E1165 Type 2 diabetes mellitus with hyperglycemia: Secondary | ICD-10-CM

## 2010-09-13 NOTE — Patient Instructions (Addendum)
1) No major changes today.  2) Continue insulin lantus 8 units in the AM   AND continue novolog as you are currently using. 3) IF you notice you AM blood sugars are <80 on multiple testing in the AM you can decrease your Lantus back to 6 units in the AM. 4) Hope you continue to feel well off the lipitor.Marland KitchenMarland KitchenYou can start hula-hooping again! 5) Great to see you again!

## 2010-09-13 NOTE — Progress Notes (Signed)
  Subjective:    Patient ID: Alicia [name], female    DOB: Nov 03, 1955, 55 y.o.   MRN: [id number]  HPI Arrives with interpreter.  Reports feeling ill for about 2-3 weeks, has been nearly back to normal for the past 3 days.  Blood sugars were elevated during illness (>200), have returned to near baseline over the past few days.      Review of Systems Experienced muscle aches with lipitor, these have completely resolved since stopping the agent.    Objective:   Physical Exam Last A1c was 7.6 three weeks ago.       Assessment & Plan:

## 2010-09-13 NOTE — Progress Notes (Signed)
  Subjective:    Patient ID: Alicia [name], female    DOB: [birth year], 55 y.o.   MRN: [id number]  HPI  Reviewed and agree  Review of Systems     Objective:   Physical Exam        Assessment & Plan:

## 2010-09-13 NOTE — Assessment & Plan Note (Signed)
A: History of hypercholesterolemia, treated with Lipitor.  Lipid panel at goal with medication.  Patient experienced muscle aches while taking Lipitor, muscle aches resolved completely when medication was discontinued.  P: Continue to remain off cholesterol-lowering medication.  Plan to do follow-up lipid panel 2-3 months.  Re-evaluate need for medication therapy at that time.  May consider pravastatin if lifestyle modifications are not sufficient to maintain goal cholesterol.

## 2010-09-13 NOTE — Assessment & Plan Note (Signed)
A: Continues to have generally good glycemic control, but blood sugars have been higher than normal for the past couple weeks due to illness.  Blood sugars have returned to near baseline over the past 3 days.  Last A1c was 7.5 on 08/26/10  P: Continue current insulin regimen.  Patient instructed to reduce am Lantus to 6 units from 8 units IF morning BG <80 on multiple occasions in a week.  Insulin requirements expected to decrease as patient resumes exercising and loses additional weight.  Patient was encouraged to continue to remain active and get back to daily hula-hooping.  Time spent with patient: 35 minutes.  Patient seen with Tammy Sours, PharmD and Orlan Leavens, PharmD student

## 2010-09-14 ENCOUNTER — Encounter: Payer: Self-pay | Admitting: Family Medicine

## 2010-09-14 NOTE — Assessment & Plan Note (Signed)
Will order CT abd/pelvis today per radiology's recs to further evaluate the renal lesion.  As the patient is currently asymptomatic feel the CT is likely to show a simple cyst but will inform the patient if further workup is needed.

## 2010-09-14 NOTE — Progress Notes (Signed)
Subjective: Pt presents after a trip to urgent care/ED for abdominal pain.  As part of her workup she had a non-contrast CT of the abdomon and pelvis performed which showed a lesion, quite possible a simple cyst, in her left kidney.  Unfortunately, as the ct was performed without contrast, it was not felt that the patient's lesion could be fully characterized.  The patient was not given any treatment for her abdominal complaints and they have since resolved.  Pt reports no fevers/chills, abdominal or flank pain, problems with urniation, or urine discoloration.  Objective: Filed Vitals:   09/12/10 0957  BP: 115/70  Pulse: 75  Temp: 98.4 F (36.9 C)   Filed Vitals:   09/12/10 0957  BP: 115/70  Pulse: 75  Temp: 98.4 F (36.9 C)   Gen: NAD, alert, cooperative with exam Abd: SNTND, BS present, no guarding or organomegaly Ext: No edema noted, full ROM Neuro: Alert and oriented

## 2010-09-15 LAB — GLUCOSE, CAPILLARY
Glucose-Capillary: 114 mg/dL — ABNORMAL HIGH (ref 70–99)
Glucose-Capillary: 19 mg/dL — CL (ref 70–99)
Glucose-Capillary: 36 mg/dL — CL (ref 70–99)
Glucose-Capillary: 62 mg/dL — ABNORMAL LOW (ref 70–99)
Glucose-Capillary: 71 mg/dL (ref 70–99)

## 2010-09-16 ENCOUNTER — Ambulatory Visit
Admission: RE | Admit: 2010-09-16 | Discharge: 2010-09-16 | Disposition: A | Discharge status: Home / Self Care | Payer: Medicare (Managed Care) | Source: Ambulatory Visit | Attending: Family Medicine | Admitting: Family Medicine

## 2010-09-16 ENCOUNTER — Encounter: Payer: Self-pay | Admitting: Family Medicine

## 2010-09-16 DIAGNOSIS — N289 Disorder of kidney and ureter, unspecified: Secondary | ICD-10-CM

## 2010-09-16 MED ORDER — IOHEXOL 300 MG/ML  SOLN
100.0000 mL | Freq: Once | INTRAMUSCULAR | Status: AC | PRN
  Administered 2010-09-16: 100 mL via INTRAVENOUS

## 2010-09-20 ENCOUNTER — Telehealth: Payer: Self-pay | Admitting: Family Medicine

## 2010-09-20 NOTE — Telephone Encounter (Signed)
Called to inform that labs were normal except iron level was low. Recommend she take iron supplements from OTC. A1c was stable at 7.6. Requested she make a f/u appointment if still having pains in her muscles.

## 2010-09-20 NOTE — Telephone Encounter (Signed)
Patient inquiring of labs

## 2010-09-20 NOTE — Telephone Encounter (Signed)
Please call patient regarding labs she had drawn on 2/24 visit.

## 2010-09-30 ENCOUNTER — Encounter: Payer: Self-pay | Admitting: Family Medicine

## 2010-09-30 ENCOUNTER — Ambulatory Visit (INDEPENDENT_AMBULATORY_CARE_PROVIDER_SITE_OTHER): Payer: Medicare (Managed Care) | Admitting: Family Medicine

## 2010-09-30 VITALS — BP 138/75 | HR 81 | Temp 98.3°F | Ht 63.0 in | Wt 147.0 lb

## 2010-09-30 DIAGNOSIS — E1165 Type 2 diabetes mellitus with hyperglycemia: Secondary | ICD-10-CM

## 2010-09-30 DIAGNOSIS — E611 Iron deficiency: Secondary | ICD-10-CM

## 2010-09-30 DIAGNOSIS — M79609 Pain in unspecified limb: Secondary | ICD-10-CM

## 2010-09-30 DIAGNOSIS — E1149 Type 2 diabetes mellitus with other diabetic neurological complication: Secondary | ICD-10-CM

## 2010-09-30 DIAGNOSIS — IMO0002 Reserved for concepts with insufficient information to code with codable children: Secondary | ICD-10-CM

## 2010-09-30 DIAGNOSIS — E7800 Pure hypercholesterolemia, unspecified: Secondary | ICD-10-CM

## 2010-09-30 DIAGNOSIS — IMO0001 Reserved for inherently not codable concepts without codable children: Secondary | ICD-10-CM

## 2010-09-30 NOTE — Patient Instructions (Signed)
Nice to see you again. Please start taking iron supplements from over the counter. You may try taking motrin for your hip pain. I will call you if labs or xray is abnormal. Please make appointment in 2 months.

## 2010-10-01 ENCOUNTER — Encounter: Payer: Self-pay | Admitting: Family Medicine

## 2010-10-01 DIAGNOSIS — E611 Iron deficiency: Secondary | ICD-10-CM | POA: Insufficient documentation

## 2010-10-01 LAB — RHEUMATOID FACTOR: Rhuematoid fact SerPl-aCnc: <10 IU/mL (ref <=14)

## 2010-10-01 LAB — VITAMIN D 25 HYDROXY (VIT D DEFICIENCY, FRACTURES): Vit D, 25-Hydroxy: 37 ng/mL (ref 30–89)

## 2010-10-01 NOTE — Assessment & Plan Note (Signed)
Distal extremity neuropathy/paresthesias controlled. Will continue current neurontin dose. This may be playing a part in her current proximal pain symptoms, but would be atypical.

## 2010-10-01 NOTE — Assessment & Plan Note (Addendum)
Leg pain is proximal and bilateral. Unsure of etiology as muscle aches have improved. Borderline hypercalcemia in the past, will check Vit D, PTH, Ca, RF and anti-CCP to screen for rheumatoid vs. Endocrine causes of this pain. If work-up is negative, will consider neuropathic pain vs. Fibromyalgia-type syndrome. Will obtain left hip film to examine for bone pathology after recent trauma and palpable nodule on exam.  Motrin prn for pain.

## 2010-10-01 NOTE — Assessment & Plan Note (Signed)
Will continue current doses lantus 8 u qhs and low dose novolog. CBGs are under better control this week. F/u with A1c in 2-3 months.

## 2010-10-01 NOTE — Progress Notes (Signed)
  Subjective:    Patient ID: Alicia [name], female    DOB: 1956/03/28, 55 y.o.   MRN: [id number]  HPI 1. Muscle aches. Severe for many weeks, but have improved since discontinuation of statin. CK was never elevated. Now symptoms are still bothering her, but describes this more as a bony pain. Feels deep discomfort, especially when one of her grand-kids runs into her, she sits down, or makes an athletic move such as throwing a ball. Pain is in bilateral upper arms and bilateral thighs. Sore in the morning. Recently hit her left hip on a table, now has a palpable nodule overlying the lateral hip, painful. No pain in distal extremities. This has limited her physical activity.  2. Diabetic neuropathy. Symptoms previously describes as itching in her hands. This is resolved since starting neurontin. Denies numbness, tingling, foot or hand pain.   3. N/V. Recently seen in ED for this problem. Has resolved spontaneously, attributed to gastroenteritis. Only abnormality was hyperglycemia.    Review of Systems Denies abdominal pain, chest pain, weight gain, n/v, constipation    Objective:   Physical Exam  Constitutional: She is oriented to person, place, and time. She appears well-developed and well-nourished. No distress.  HENT:  Head: Normocephalic and atraumatic.  Eyes: EOM are normal. Pupils are equal, round, and reactive to light.  Cardiovascular: Normal rate, regular rhythm and normal heart sounds.   No murmur heard. Pulmonary/Chest: Effort normal and breath sounds normal. No respiratory distress. She has no wheezes. She has no rales.  Abdominal: Soft. Bowel sounds are normal.  Musculoskeletal: Normal range of motion. She exhibits no edema and no tenderness.       Left hip has tender 1cm mobile nodule overlying lateral bursa. No SI joint tenderness or knee tenderness. 2+DP pulses bilaterally, feet are warm and well-perfused, nontender distally.  Neurological: She is alert and oriented to person,  place, and time. She exhibits normal muscle tone. Coordination normal.  Skin: Skin is warm. No rash noted.  Psychiatric: She has a normal mood and affect. Her behavior is normal. Thought content normal.          Assessment & Plan:

## 2010-10-01 NOTE — Assessment & Plan Note (Signed)
Discontinued statin therapy. Will recheck FLP in 2-3 months and consider needs for medication after patient has found success with lifestyle modifications (diet/exercise) this year.

## 2010-10-03 ENCOUNTER — Telehealth: Payer: Self-pay | Admitting: Family Medicine

## 2010-10-03 LAB — CYCLIC CITRUL PEPTIDE ANTIBODY, IGG: Cyclic Citrullin Peptide Ab: <2 U/mL (ref 0.0–5.0)

## 2010-10-03 LAB — PTH, INTACT AND CALCIUM
Calcium, Total (PTH): 10.3 mg/dL (ref 8.4–10.5)
PTH: 52 pg/mL (ref 14.0–72.0)

## 2010-10-03 NOTE — Telephone Encounter (Signed)
Yes, I completed form in the fax box.

## 2010-10-03 NOTE — Telephone Encounter (Signed)
Calling to say she has changed vendor for her test strips to [facility].  Suppose to have faxed request for them.  Pt wanted to make sure provider received to fill order

## 2010-10-05 ENCOUNTER — Ambulatory Visit
Admission: RE | Admit: 2010-10-05 | Discharge: 2010-10-05 | Disposition: A | Discharge status: Home / Self Care | Payer: Medicare (Managed Care) | Source: Ambulatory Visit | Attending: Family Medicine | Admitting: Family Medicine

## 2010-10-05 DIAGNOSIS — M79609 Pain in unspecified limb: Secondary | ICD-10-CM

## 2010-10-06 ENCOUNTER — Telehealth: Payer: Self-pay | Admitting: Family Medicine

## 2010-10-06 NOTE — Telephone Encounter (Signed)
Calling to inform patient that recent labs and hip x-ray were normal. Her pain is atypical but no signs of bony involvment. Recommend she schedule another appointment to discuss likely neuropathic etiologies of pain if symptoms persist.

## 2010-10-13 ENCOUNTER — Ambulatory Visit (HOSPITAL_COMMUNITY)
Admission: RE | Admit: 2010-10-13 | Discharge: 2010-10-13 | Disposition: A | Discharge status: Home / Self Care | Payer: No Typology Code available for payment source | Source: Ambulatory Visit | Attending: Family Medicine | Admitting: Family Medicine

## 2010-10-13 DIAGNOSIS — Z1231 Encounter for screening mammogram for malignant neoplasm of breast: Secondary | ICD-10-CM | POA: Insufficient documentation

## 2010-10-17 ENCOUNTER — Other Ambulatory Visit: Payer: Self-pay | Admitting: *Deleted

## 2010-10-17 DIAGNOSIS — I10 Essential (primary) hypertension: Secondary | ICD-10-CM

## 2010-10-17 NOTE — Telephone Encounter (Signed)
Refill request

## 2010-10-18 ENCOUNTER — Ambulatory Visit: Payer: Medicare (Managed Care) | Admitting: Pharmacist

## 2010-10-18 MED ORDER — LOSARTAN POTASSIUM 100 MG PO TABS: 100.0000 mg | ORAL_TABLET | Freq: Every day | ORAL | Status: DC

## 2010-10-19 MED ORDER — AMLODIPINE BESYLATE 10 MG PO TABS: 10.0000 mg | ORAL_TABLET | Freq: Every day | ORAL | Status: DC

## 2010-10-26 ENCOUNTER — Other Ambulatory Visit: Payer: Self-pay | Admitting: Family Medicine

## 2010-10-26 DIAGNOSIS — I10 Essential (primary) hypertension: Secondary | ICD-10-CM

## 2010-10-26 MED ORDER — LOSARTAN POTASSIUM 100 MG PO TABS: 100.0000 mg | ORAL_TABLET | Freq: Every day | ORAL | Status: DC

## 2010-10-26 MED ORDER — AMLODIPINE BESYLATE 10 MG PO TABS: 10.0000 mg | ORAL_TABLET | Freq: Every day | ORAL | Status: DC

## 2010-10-26 NOTE — Telephone Encounter (Signed)
Received refill request for Lipitor. Will send message to MD as I do not see on med list .

## 2010-11-04 ENCOUNTER — Ambulatory Visit (INDEPENDENT_AMBULATORY_CARE_PROVIDER_SITE_OTHER): Payer: Medicare (Managed Care) | Admitting: Pharmacist

## 2010-11-04 ENCOUNTER — Encounter: Payer: Self-pay | Admitting: Pharmacist

## 2010-11-04 VITALS — BP 164/80 | HR 75 | Ht 63.5 in | Wt 150.0 lb

## 2010-11-04 DIAGNOSIS — E1165 Type 2 diabetes mellitus with hyperglycemia: Secondary | ICD-10-CM

## 2010-11-04 DIAGNOSIS — I10 Essential (primary) hypertension: Secondary | ICD-10-CM

## 2010-11-04 DIAGNOSIS — IMO0002 Reserved for concepts with insufficient information to code with codable children: Secondary | ICD-10-CM

## 2010-11-04 MED ORDER — FLUTICASONE PROPIONATE 50 MCG/ACT NA SUSP: 2.0000 | Freq: Every day | NASAL | Status: DC

## 2010-11-04 MED ORDER — INSULIN ASPART 100 UNIT/ML ~~LOC~~ SOLN: SUBCUTANEOUS | Status: DC

## 2010-11-04 MED ORDER — GLIMEPIRIDE 4 MG PO TABS: 4.0000 mg | ORAL_TABLET | ORAL | Status: DC

## 2010-11-04 MED ORDER — AMLODIPINE BESYLATE 10 MG PO TABS: 10.0000 mg | ORAL_TABLET | Freq: Every day | ORAL | Status: DC

## 2010-11-04 NOTE — Progress Notes (Signed)
  Subjective:    Patient ID: Alicia [name], female    DOB: Jan 26, 1956, 55 y.o.   MRN: [id number]  HPI Patient arrives in good spirits, accompanied by interpreter Lawson Fiscal) Patients states she hasn't been exercising much because of pain in her legs and shoulder pain. Has been hula-hooping some for exercise. Reports ~5 episodes of low blood sugars (gets shaky) over the past month.  Reports some bloating and states that she sometimes goes 3 days without a bowel movement. Patient uses Miraloax and eats broccoli and cabbage when this occurs.    Review of Systems     Objective:   Physical Exam BP 164/80  A1c 7.6 (08/26/2010)  5 CBGs <80 at home    Assessment & Plan:  Hypertension: Currently not at goal due to not taking medication because patient was out of refills. Refilled amlodipine and instructed patient to start taking it again. Continue to monitor BP. Follow-up with Dr. Cristal Ford in one month.  Diabetes: At goal. Overall good BG control, but still has a few erratic CBGs. Has experienced approximately 5-8 hypoglycemic episodes over the past month. Due to weight loss and better BG control, will discontinue Lantus and start Amaryl (patient agreeable to this). If BG control continues to improve, may be able to discontinue Novolog in the future.  Provided new Novolog dosing sliding scale. Continue to monitor CBGs and A1c. Follow-up with Dr. Cristal Ford in one month. TTFFC: 35 minutes Patient seen with: Garry Heater, PharmD candidate and Georgina Pillion, Pharmacy Resident

## 2010-11-04 NOTE — Assessment & Plan Note (Signed)
Diabetes: At goal. Overall good BG control, but still has a few erratic CBGs. Has experienced approximately 5-8 hypoglycemic episodes over the past month. Due to weight loss and better BG control, will discontinue Lantus and start Amaryl (patient agreeable to this). If BG control continues to improve, may be able to discontinue Novolog in the future.  Provided new Novolog dosing sliding scale. Continue to monitor CBGs and A1c. Follow-up with Dr. Cristal Ford in one month. TTFFC: 35 minutes Patient seen with: Garry Heater, PharmD candidate and Georgina Pillion, Pharmacy Resident

## 2010-11-04 NOTE — Assessment & Plan Note (Signed)
Hypertension: Currently not at goal due to not taking medication because patient was out of refills. Refilled amlodipine and instructed patient to start taking it again. Continue to monitor BP. Follow-up with Dr. Cristal Ford in one month.

## 2010-11-04 NOTE — Patient Instructions (Signed)
1) Stop Lantus. 2) Start Amaryl once daily before breakfast. 3) Take Novolog:  0 units if pre-meal blood sugar <150,    2 units if 150-200  3 units if 201-250  4 units if >250 4) Follow-up with Dr. Cristal Ford 5) Keep up the good work with exercise!

## 2010-11-04 NOTE — Progress Notes (Signed)
  Subjective:    Patient ID: Alicia [name], female    DOB: 1955-09-23, 55 y.o.   MRN: [id number]  HPI Reviewed and agree with Dr. Raymondo Band.    Review of Systems     Objective:   Physical Exam        Assessment & Plan:

## 2010-11-08 ENCOUNTER — Telehealth: Payer: Self-pay | Admitting: *Deleted

## 2010-11-08 NOTE — Telephone Encounter (Signed)
Patient called back.  I gave her Dr. Macky Lower instructions and asked her to call me back tomorrow.  I did clarify that patient is still covering herself with the Novalog based on the same SS coverage as before.  She will call back tomorrow.

## 2010-11-08 NOTE — Telephone Encounter (Signed)
Patient was seen in Pharmacy clinic on Friday.  Was instructed to stop her Lantus d/t hypoglycemic episodes and was started on Glimepiride 4 mg daily.  Patient states that since stopping the Lantus her BS had gradually increased to the upper 300s to 500.  Told her I would need to speak with Dr. Raymondo Band and would call her back.  Dr. Raymondo Band recommends that she increase the Glimepiride to BID and to continue the SS coverage with the Novalog.  He also recommended that she cover herself now with a one time does of 4 units of the Lantus.  Tried to call patient back but she did not answer.  Will try again before she leaves for work at noon.

## 2010-11-09 ENCOUNTER — Telehealth: Payer: Self-pay | Admitting: *Deleted

## 2010-11-09 NOTE — Telephone Encounter (Signed)
As instructed, patient called back today to provide an update to the changes in her medication regime.  She increased her Glimepiride to twice a day and took the one time does of 4 units of Lantus.   CBGs since then are as follows:  455 at lunch (took 4 U of Novalog), 135 at dinner (no coverage), 455 at HS ( took 4 U of Novalog) and 370 this am (took 4 U of Novalog).  Pt is still experiencing HAs and nausea but not as severe.  Instructed her to continue this same regime, keep drinking plenty of fluids and to call us back again tomorrow to see if BS is coming down.  Also told her I would touch base with Dr. Raymondo Band and if he had additional instructions I would call her back.

## 2010-11-10 NOTE — Telephone Encounter (Signed)
CBGs since yesterday:  noon - 192, dinner - 207, hs - 342, am (today) - 405.  Told Ninnie that I would talk with Dr. Raymondo Band and call her back.

## 2010-11-10 NOTE — Telephone Encounter (Signed)
Called patient back and instructed her to stop the oral agent and to resume 8 U of Lantus q am.  Also to continue the SS coverage with the Novalog.  Recommended that she see her PCP before the 6/7 appt date.  Gave her an appt for Monday the 14th at 9 am.  Patient expressed understanding of all instructions.

## 2010-11-10 NOTE — Telephone Encounter (Signed)
Switch therapy to oral sulfonylurea therapy appears to be inadequate to control CBGs even with BID dosing.  Patient will likely need long-term insulin tx.  Plan to D/C glimepiride (Amaryl) and restart previous dose of Lantus 8 units QAM.  Follow up Next visit as planned with Dr. Cristal Ford.

## 2010-11-14 ENCOUNTER — Ambulatory Visit (INDEPENDENT_AMBULATORY_CARE_PROVIDER_SITE_OTHER): Payer: Medicare (Managed Care) | Admitting: Family Medicine

## 2010-11-14 ENCOUNTER — Encounter: Payer: Self-pay | Admitting: Family Medicine

## 2010-11-14 VITALS — BP 130/77 | HR 80 | Wt 147.0 lb

## 2010-11-14 DIAGNOSIS — IMO0001 Reserved for inherently not codable concepts without codable children: Secondary | ICD-10-CM

## 2010-11-14 DIAGNOSIS — I10 Essential (primary) hypertension: Secondary | ICD-10-CM

## 2010-11-14 DIAGNOSIS — E1165 Type 2 diabetes mellitus with hyperglycemia: Secondary | ICD-10-CM

## 2010-11-14 DIAGNOSIS — E118 Type 2 diabetes mellitus with unspecified complications: Secondary | ICD-10-CM

## 2010-11-14 LAB — POCT GLYCOSYLATED HEMOGLOBIN (HGB A1C): Hemoglobin A1C: 7.9

## 2010-11-14 NOTE — Progress Notes (Signed)
  Subjective:    Patient ID: Alicia [name], female    DOB: Jul 22, 1955, 55 y.o.   MRN: [id number]  HPI  1. DM. Recently attempted change from low dose insulin to oral antiglycemic therapy. D/C'd Lantus 8 units and Novolog 2-4 SSI and started amaryl BID. Experienced hyperglycemia up to 455 after the change. Called clinic and advised to double amaryl dose; however her CBGs remained >300. On 5/12 was advised to return to Lantus therapy as previously prescribed. Since that time control has improved with fasting 154-174, lunch 132-152, dinner 79-81, qhs 189-190s. Patient has now regained regular diet and sliding scale with novolog.   2. Myalgias. Still has generalized muscle weakness. Pain in her hip has improved. Most recently playing with a toy ball with her grandson and strained her left wrist. Has constant pain in flexural side of left wrist described as numbness and tingling. Does not radiate. Denies direct trauma. Has history of cyst removed from same aspect of wrist many years ago.   Review of Systems Denies HA, SOB, fevers.    Objective:   Physical Exam  Vitals reviewed. Constitutional: She is oriented to person, place, and time. She appears well-developed and well-nourished. No distress.  HENT:  Head: Normocephalic and atraumatic.  Cardiovascular: Normal rate, regular rhythm, normal heart sounds and intact distal pulses.   No murmur heard. Pulmonary/Chest: Effort normal. No respiratory distress.  Musculoskeletal: Normal range of motion. She exhibits no edema.       Left wrist with point tenderness in middle of carpal tunnel area. Negative phalen sign. No palpable bony deformity. Full ROM.   Neurological: She is alert and oriented to person, place, and time. No cranial nerve deficit.  Skin: She is not diaphoretic.  Psychiatric: She has a normal mood and affect.          Assessment & Plan:

## 2010-11-14 NOTE — Assessment & Plan Note (Signed)
Extensive lab workup has been negative thus far. Mild improvement with discontinuation of statin, however, patient continues to have vague achiness and distal pain complaints such as numbness/tingling in wrist. Most likely this can be attributed to neuropathic vs fibromyalgic type pain. Will attempt to titrate neurontin as tolerated and consider addition of SNRI if still problematic.

## 2010-11-14 NOTE — Assessment & Plan Note (Signed)
Patient will likely require low dose insulin indefinitley after failing trial of oral medication. Will continue with resumed Lantus 8 units and Novolog sliding scale as previously prescribed. DC amaryl as this had little benefit. A1c today is stable at 7.9 (7.6). F/u in pharm clinic in one month.

## 2010-11-14 NOTE — Assessment & Plan Note (Signed)
Controlled currently. Continue meds.

## 2010-11-14 NOTE — Patient Instructions (Addendum)
Continue taking Lantus 8 units daily and the Novolog sliding scale 2-3-4 units for Blood sugar >150.  Try taking an extra Neurontin tablet at bedtime dose. Your blood pressure is good today.  Keep up the good work with exercise. Follow up with Dr. Raymondo Band as scheduled. Follow up with me in 2-3 months.

## 2010-12-08 ENCOUNTER — Ambulatory Visit (INDEPENDENT_AMBULATORY_CARE_PROVIDER_SITE_OTHER): Payer: Medicare (Managed Care) | Admitting: Family Medicine

## 2010-12-08 ENCOUNTER — Encounter: Payer: Self-pay | Admitting: Family Medicine

## 2010-12-08 VITALS — BP 132/76 | HR 70 | Temp 98.1°F | Ht 63.5 in | Wt 144.0 lb

## 2010-12-08 DIAGNOSIS — M25539 Pain in unspecified wrist: Secondary | ICD-10-CM

## 2010-12-08 DIAGNOSIS — I10 Essential (primary) hypertension: Secondary | ICD-10-CM

## 2010-12-08 DIAGNOSIS — M25532 Pain in left wrist: Secondary | ICD-10-CM | POA: Insufficient documentation

## 2010-12-08 DIAGNOSIS — E1149 Type 2 diabetes mellitus with other diabetic neurological complication: Secondary | ICD-10-CM

## 2010-12-08 NOTE — Assessment & Plan Note (Signed)
At goal today  Continue current medication

## 2010-12-08 NOTE — Patient Instructions (Signed)
We will look at an x-ray of your wrist.  You may try taking aleve (naproxen) 250-500 mg every 12 hours for 3 days, then as needed. Try to use ice on the wrist when painful. I will call if your xray is abnormal. Make follow up appointment if pain worsens.

## 2010-12-08 NOTE — Assessment & Plan Note (Signed)
Controlled. Continue neurontin at current dose.

## 2010-12-08 NOTE — Assessment & Plan Note (Signed)
Tendonitis vs bone trauma after injury. Will obtain plain film to rule out fracture or pathology. Will treat as tendonitis with scheduled naproxen x 3 days then prn. Ice daily. Will consider splinting if pain persists to prevent further aggravation. F/u prn.

## 2010-12-08 NOTE — Progress Notes (Signed)
  Subjective:    Patient ID: Alicia [name], female    DOB: May 06, 1956, 55 y.o.   MRN: [id number]  HPI  1. Wrist pain. Began ~2 months ago after playing with her grandson. Was tossing a large ball and tweaked her wrist with a hyper-extension type motion. Since then has burning pain in palmar aspect of medial left wrist. Tried ice and intermittent NSAIDs that did not help. Pain bad at night, when twisting jars and general use of wrist. Pain does not radiate, no weakness, numbness or tingling. Had a cyst removed from dorsal left wrist many years ago.   2. DM. CBGs under control since recent insulin dose changes. Denies change in baseline values currently. Neuropathy controlled with neurontin.  Review of Systems See HPI. No weakness, numbness or tingling in other extremities. No arm pain.     Objective:   Physical Exam  Vitals reviewed. Constitutional: She is oriented to person, place, and time. She appears well-developed and well-nourished. No distress.  HENT:  Head: Normocephalic and atraumatic.  Eyes: EOM are normal.  Pulmonary/Chest: Effort normal.  Musculoskeletal: Normal range of motion. She exhibits no edema and no tenderness.       Mild TTP over medial palmar surface of left wrist. No bony abnormality palpated. Negative phalen's, no ulnar groove pain. 5/5 interosseous and grip strength. Normal sensation. Dorsal left wrist has scar from surgery.   Neurological: She is alert and oriented to person, place, and time. No cranial nerve deficit. She exhibits normal muscle tone. Coordination normal.  Skin: She is not diaphoretic.  Psychiatric: Her behavior is normal.          Assessment & Plan:

## 2010-12-09 ENCOUNTER — Other Ambulatory Visit: Payer: Self-pay | Admitting: Family Medicine

## 2010-12-09 ENCOUNTER — Ambulatory Visit
Admission: RE | Admit: 2010-12-09 | Discharge: 2010-12-09 | Disposition: A | Discharge status: Home / Self Care | Payer: Medicare (Managed Care) | Source: Ambulatory Visit | Attending: Family Medicine | Admitting: Family Medicine

## 2010-12-09 DIAGNOSIS — M25539 Pain in unspecified wrist: Secondary | ICD-10-CM

## 2010-12-13 ENCOUNTER — Telehealth: Payer: Self-pay | Admitting: Family Medicine

## 2010-12-13 NOTE — Telephone Encounter (Signed)
Alicia [name] called back regarding phone call she received and msg taken by her husband for wrist brace the physician was going to have her pick up.   Please call her back and let her know when she can come to get it.

## 2010-12-13 NOTE — Telephone Encounter (Signed)
Called and left message to call back through language system due to patient being deaf

## 2010-12-14 NOTE — Telephone Encounter (Signed)
Alicia [name], Patient states that she is still having a bad burning and painful feeling still. Was wondering if we were going to go ahead with the splint

## 2010-12-15 NOTE — Telephone Encounter (Signed)
I recommended she buy a generic wrist splint at the drug store. If she needs a prescription, then I can do that, but these are widely available. Wear it while sleeping and doing activities. Continue her aleve as needed.

## 2010-12-20 ENCOUNTER — Ambulatory Visit (INDEPENDENT_AMBULATORY_CARE_PROVIDER_SITE_OTHER): Payer: Medicare (Managed Care) | Admitting: Pharmacist

## 2010-12-20 ENCOUNTER — Encounter: Payer: Self-pay | Admitting: Pharmacist

## 2010-12-20 DIAGNOSIS — E118 Type 2 diabetes mellitus with unspecified complications: Secondary | ICD-10-CM

## 2010-12-20 DIAGNOSIS — E1165 Type 2 diabetes mellitus with hyperglycemia: Secondary | ICD-10-CM

## 2010-12-20 MED ORDER — INSULIN ASPART 100 UNIT/ML ~~LOC~~ SOLN: SUBCUTANEOUS | Status: DC

## 2010-12-20 NOTE — Progress Notes (Signed)
  Subjective:    Patient ID: Alicia [name], female    DOB: [birth year], 55 y.o.   MRN: [id number]  HPI Reviewed and agree with Dr. Macky Lower  management.      Review of Systems     Objective:   Physical Exam        Assessment & Plan:

## 2010-12-20 NOTE — Patient Instructions (Signed)
Change morning sliding scale to: Blood glucose < 200 --> 0 unit Blood glucose 200-250 --> 1 unit Blood glucose > 250 --> 3 units  Continue current scale for lunch and dinner time novolog: Blood glucose < 150 --> 0 unit Blood glucose < 151-200 --> 2 unit Blood glucose 201-250 --> 3 unit Blood glucose > 251 --> 4 units  Check blood glucose in the middle of the night if wake up with any symptoms of increasing heart rate.

## 2010-12-20 NOTE — Assessment & Plan Note (Signed)
Pt is doing well in general with her blood pressure and blood glucose control. There are some concerns about her AM short acting insulin being too high. Will adjust her short acting insulin to a lower scale in the morning (0 units if cbg < 200, 1 unit if cbg = 201 - 250, and 3 units if cbg > 251) and keep her lunch and dinner time sliding scales the same. For her middle of the night hyperglycemia, concern of simogy phenomenon. Therefore will not change lantus for now and recommend patient to check cbg when she experience similar symptoms again. Total time: 40 mins

## 2010-12-20 NOTE — Progress Notes (Signed)
  Subjective:    Patient ID: Alicia [name], female    DOB: 01-11-1956, 55 y.o.   MRN: [id number]  HPI pt. arrived with her interpreter with good spirit. Her blood pressure today is 145/80 and she is currently on amlodipine 10 mg daily, losartan 100 mg daily and metoprolol 50 mg bid. Pt. Said her BP was 110/70 last time when she checked [facility] BP at [facility], Patient remains active and she checks her blood glucose at least 3 times daily. She did experience 3 hypoglycemic events in mid morning (CBG 30-50s) and occasionally she wakes up in the middle of the night with increased heart rate. She check it one time, and her blood glucose was > 200. Her current diabetes medications include Lantus 8 units in the morning, and Novolog (0 units if cbg < 150, 2 units if cbg =151-200, 3 units if cbg = 201-250 and 4 units if cbg > 250)    Review of Systems     Objective:   Physical Exam        Assessment & Plan:  Pt is doing well in general with her blood pressure and blood glucose control. There are some concerns about her AM short acting insulin being too high. Will adjust her short acting insulin to a lower scale in the morning (0 units if cbg < 200, 1 unit if cbg = 201 - 250, and 3 units if cbg > 251) and keep her lunch and dinner time sliding scales the same. For her middle of the night hyperglycemia, concern of simogy phenomenon. Therefore will not change lantus for now and recommend patient to check cbg when she experience similar symptoms again. Total time: 40 mins

## 2010-12-29 DIAGNOSIS — M2550 Pain in unspecified joint: Secondary | ICD-10-CM | POA: Insufficient documentation

## 2010-12-29 NOTE — Progress Notes (Signed)
Ferritin and iron/TIBC were ordered to screen for hemochromatosis with unspecified myalgias and joint pains.

## 2011-01-06 ENCOUNTER — Ambulatory Visit (INDEPENDENT_AMBULATORY_CARE_PROVIDER_SITE_OTHER): Payer: Medicare (Managed Care) | Admitting: Family Medicine

## 2011-01-06 VITALS — BP 127/72 | HR 80 | Temp 97.7°F | Wt 145.0 lb

## 2011-01-06 DIAGNOSIS — M25539 Pain in unspecified wrist: Secondary | ICD-10-CM

## 2011-01-06 DIAGNOSIS — M25532 Pain in left wrist: Secondary | ICD-10-CM

## 2011-01-06 NOTE — Assessment & Plan Note (Signed)
Exam unchanged from previous. Likely inflammation of tendon vs wrist joint. Plain film negative for bone pathology. Recommend splinting for 1-2 weeks with activity and sleeping in addition to scheduled NSAID and ice x 4 days. F/u prn. Return to care for weakness, radiation of pain.

## 2011-01-06 NOTE — Progress Notes (Signed)
  Subjective:    Patient ID: Alicia [name], female    DOB: 22-Dec-1955, 55 y.o.   MRN: [id number]  HPI  1. Left wrist pain. Located on left palmar/ulnar aspect. Unchanged in character from initial presentation, 8/10 during activity. No radiation of pain.  Worsened last week while she was working constantly and playing with her grandkids, lifting them. Has improved somewhat this week with rest. Did try taking the scheduled aleve and ice x 3 days, but this had little effect long term. Has not trying splinting as recommended previously.   Review of Systems Denies fever, trauma, weakness of hands, back pain.     Objective:   Physical Exam  Vitals reviewed. Constitutional: She is oriented to person, place, and time. She appears well-developed and well-nourished. No distress.  HENT:  Head: Normocephalic and atraumatic.  Eyes: EOM are normal. Pupils are equal, round, and reactive to light.  Musculoskeletal: Normal range of motion. She exhibits tenderness. She exhibits no edema.       TTP on flexural/ulnar aspect of left wrist. worsened by wrist flexion and palpation, not affected by extension or ulnar deviation. Neg phalen, not exacerbated by ulnar tapping. 5/5 bilateral grip strength and interosseous muscles.  Neurological: She is alert and oriented to person, place, and time. No cranial nerve deficit. She exhibits normal muscle tone. Coordination normal.          Assessment & Plan:

## 2011-01-06 NOTE — Patient Instructions (Signed)
Try wearing splint during activities and during sleep. Take aleve twice daily for next 3-4 days. Use ice packs twice daily for 20 minutes. Make appointment as needed.  Tendinitis (Tendonitis, Tenosynovitis) You have tendinitis. Tendinitis is a swelling and irritation (inflammation) of the tissue called tendons. These are cord like structure that connect muscle to bone. It commonly occurs at the:   Shoulders (rotator cuff).   Heels (achilles tendon).   Elbows (triceps tendon).  It usually is caused by overusing the tendon and joint involved. When the tissue surrounding a tendon (synovium) becomes inflamed, it is called tenosynovitis. This also is often the result of overuse in people whose jobs require repetitive (over and over again) types of motion. HOME CARE INSTRUCTIONS  Use a sling or splint for one week until the pain decreases.   Apply ice to the injury for 15 minutes 2-3 times per day. Put the ice in a plastic bag. Place a towel between the bag of ice and your skin.   Try to avoid use other than gentle range of motion while the tendon is painful. Use and exercise only as directed by your caregiver. Stop exercises or range of motion if pain or discomfort increase unless directed otherwise by your caregiver.   Only take over-the-counter or prescription medicines for pain, discomfort, or fever as directed by your caregiver.  SEEK MEDICAL CARE IF:  Your pain and swelling increase.   You develop new, unexplained symptoms, especially increased numbness in the hands.  MAKE SURE YOU:   Understand these instructions.   Will watch your condition.   Will get help right away if you are not doing well or get worse.  Document Released: 06/16/2000 Document Re-Released: 09/15/2008 [facility] Patient Information 2011 [place], [place].

## 2011-01-17 ENCOUNTER — Ambulatory Visit: Payer: Medicare (Managed Care) | Admitting: Pharmacist

## 2011-02-03 ENCOUNTER — Telehealth: Payer: Self-pay | Admitting: *Deleted

## 2011-02-03 ENCOUNTER — Other Ambulatory Visit: Payer: Self-pay | Admitting: Family Medicine

## 2011-02-03 ENCOUNTER — Ambulatory Visit (INDEPENDENT_AMBULATORY_CARE_PROVIDER_SITE_OTHER): Payer: Medicare (Managed Care) | Admitting: *Deleted

## 2011-02-03 DIAGNOSIS — I10 Essential (primary) hypertension: Secondary | ICD-10-CM

## 2011-02-03 NOTE — Telephone Encounter (Signed)
Refill request

## 2011-02-03 NOTE — Telephone Encounter (Signed)
[facility] went to [facility] just now to [facility]ill and checked her BP while she was there.  It was 96/63 and is wanting to know if she should be concerned.  She is on HTN meds.  In checking her previous readings at the clinic, that is a bit low for her.  Asked her if she felt lightheaded, dizzy or weak.  She states that she does feel a little funny.  Asked her if she has been hydrating well and she says yes.  Advised her to drop by the clinic on her way to work and we would check her BP here to see if it is still low.  Patient agreeable.  I will call for an interpreter.

## 2011-02-03 NOTE — Progress Notes (Signed)
In for BP check. BP checked manually using regular adult cuff. BP LA  140/66, RA 140/76  pulse 84. Patient states she had no symptoms when she had checked BP  this AM at [facility].

## 2011-02-14 ENCOUNTER — Ambulatory Visit (INDEPENDENT_AMBULATORY_CARE_PROVIDER_SITE_OTHER): Payer: Medicare (Managed Care) | Admitting: Pharmacist

## 2011-02-14 DIAGNOSIS — E118 Type 2 diabetes mellitus with unspecified complications: Secondary | ICD-10-CM

## 2011-02-14 DIAGNOSIS — E1165 Type 2 diabetes mellitus with hyperglycemia: Secondary | ICD-10-CM

## 2011-02-14 DIAGNOSIS — E1149 Type 2 diabetes mellitus with other diabetic neurological complication: Secondary | ICD-10-CM

## 2011-02-14 MED ORDER — METFORMIN HCL 1000 MG PO TABS: 1000.0000 mg | ORAL_TABLET | Freq: Two times a day (BID) | ORAL | Status: DC

## 2011-02-14 MED ORDER — INSULIN ASPART 100 UNIT/ML ~~LOC~~ SOLN: SUBCUTANEOUS | Status: DC

## 2011-02-14 MED ORDER — POLYETHYLENE GLYCOL 3350 17 GM/SCOOP PO POWD: 17.0000 g | ORAL | Status: DC

## 2011-02-14 MED ORDER — GABAPENTIN 300 MG PO CAPS: 300.0000 mg | ORAL_CAPSULE | Freq: Four times a day (QID) | ORAL | Status: DC

## 2011-02-14 NOTE — Assessment & Plan Note (Signed)
Diabetes of many yrs duration currently under good control of blood glucose based on   Lab Results  Component Value Date   HGBA1C 7.9 11/14/2010  ,home fasting CBG readings of high 100-200s. The best premeal readings are before dinner (high 100) and the worst are before lunch (high 200s). Patient reports 2-3 hypoglycemic events and waking up during the night with a racing heart.  Able to verbalize appropriate hypoglycemia management plan. Continued basal insulin Lantus (insulin glargine) at 8 units daily. Continued rapid insulin Novolog (insulin aspart) before meals according to sliding scale: CBG<150=0 units, 150-200=2 unit, 201-250=3 units, >250=4 units.  Written patient instructions provided.  Follow up in  Pharmacist Clinic Visit with Dr. Cristal Ford in 2 months.   TTFFC 30 minutes.  Patient seen with Audry Riles, PharmD Candidate and Albertine Grates, Pharmacy Resident

## 2011-02-14 NOTE — Progress Notes (Signed)
  Subjective:    Patient ID: Alicia [name], female    DOB: 1955/08/27, 55 y.o.   MRN: [id number]  HPI Reviewed and agree with Dr. Macky Lower management.    Review of Systems     Objective:   Physical Exam        Assessment & Plan:

## 2011-02-14 NOTE — Patient Instructions (Signed)
We are changing your Novolog Sliding Scale today.   The Breakfast dose is changing If your pre-meal blood sugar is less than 150, give no insulin; If 150-200 give 2 units, if 201-250 give 3 units, if greater than 250, give 4 units.  Continue to monitor your blood sugar at home on a regular basis.  Keep up the hula hooping!

## 2011-02-14 NOTE — Progress Notes (Signed)
  Subjective:    Patient ID: Hedy Camara, female    DOB: August 06, 1955, 55 y.o.   MRN: [id number]  HPI Ms. Mathies presented today in high spirits accompanied by her ASL interpreter.  She is currently on lantus 8 units daily with a modified sliding scale before meals.  She reports experiencing 2-3 hypoglycemic events (CBG <70) in the mid morning and occasionally wakes up in the middle of the night with increased heart rate.  She also reports occasionally missing meals.  Review of Systems     Objective:   Physical Exam Filed Vitals:   02/14/11 1030  BP: 138/78  Pulse: 74      Assessment & Plan:  Diabetes of many yrs duration currently under good control of blood glucose based on   Lab Results  Component Value Date   HGBA1C 7.9 11/14/2010  ,home fasting CBG readings of high 100-200s. The best premeal readings are before dinner (high 100) and the worst are before lunch (high 200s). Patient reports 2-3 hypoglycemic events and waking up during the night with a racing heart.  Able to verbalize appropriate hypoglycemia management plan. Continued basal insulin Lantus (insulin glargine) at 8 units daily. Continued rapid insulin Novolog (insulin aspart) before meals according to sliding scale: CBG<150=0 units, 150-200=2 unit, 201-250=3 units, >250=4 units.  Written patient instructions provided.  Follow up in  Pharmacist Clinic Visit with Dr. Cristal Ford in 2 months.   TTFFC 30 minutes.  Patient seen with Audry Riles, PharmD Candidate and Albertine Grates, Pharmacy Resident .

## 2011-02-22 ENCOUNTER — Other Ambulatory Visit: Payer: Self-pay | Admitting: Family Medicine

## 2011-02-22 NOTE — Telephone Encounter (Signed)
Refill request

## 2011-02-27 ENCOUNTER — Other Ambulatory Visit: Payer: Self-pay | Admitting: Family Medicine

## 2011-02-27 DIAGNOSIS — I10 Essential (primary) hypertension: Secondary | ICD-10-CM

## 2011-02-27 MED ORDER — METOPROLOL TARTRATE 50 MG PO TABS: 50.0000 mg | ORAL_TABLET | Freq: Two times a day (BID) | ORAL | Status: DC

## 2011-03-01 ENCOUNTER — Telehealth: Payer: Self-pay | Admitting: Family Medicine

## 2011-03-01 NOTE — Telephone Encounter (Signed)
Patient c/o of thumb x 3 wks   Want to discuss with nurse

## 2011-03-01 NOTE — Telephone Encounter (Signed)
Patient reports that she was cutting up some chicken on 8/15 and ever since then she has been having pain in her left thumb.  Rates the pain at an 8 when it's at it's worst.  Does not remember injuring it.  Advised her that she should be seen so gave her an appt with Dr. Cristal Ford for tomorrow at 9:15.

## 2011-03-02 ENCOUNTER — Encounter: Payer: Self-pay | Admitting: Family Medicine

## 2011-03-02 ENCOUNTER — Ambulatory Visit (INDEPENDENT_AMBULATORY_CARE_PROVIDER_SITE_OTHER): Payer: Medicare (Managed Care) | Admitting: Family Medicine

## 2011-03-02 VITALS — BP 118/73 | HR 80 | Temp 98.2°F | Ht 63.0 in | Wt 147.1 lb

## 2011-03-02 DIAGNOSIS — M65839 Other synovitis and tenosynovitis, unspecified forearm: Secondary | ICD-10-CM

## 2011-03-02 DIAGNOSIS — E1165 Type 2 diabetes mellitus with hyperglycemia: Secondary | ICD-10-CM

## 2011-03-02 DIAGNOSIS — M778 Other enthesopathies, not elsewhere classified: Secondary | ICD-10-CM

## 2011-03-02 DIAGNOSIS — E118 Type 2 diabetes mellitus with unspecified complications: Secondary | ICD-10-CM

## 2011-03-02 LAB — POCT GLYCOSYLATED HEMOGLOBIN (HGB A1C): Hemoglobin A1C: 7.5

## 2011-03-02 MED ORDER — NAPROXEN 500 MG PO TABS: 500.0000 mg | ORAL_TABLET | Freq: Two times a day (BID) | ORAL | Status: DC

## 2011-03-02 NOTE — Patient Instructions (Addendum)
You most likely have an inflamed tendon in your thumb. Take aleve twice daily for 7 days. Use ice as needed for pain. Use your thumb splint as needed when doing activities.  Restrict yourself to light duty for next 2 weeks.  If not improved in 2 weeks, then make follow up appointment.

## 2011-03-03 ENCOUNTER — Encounter: Payer: Self-pay | Admitting: Family Medicine

## 2011-03-03 NOTE — Progress Notes (Signed)
  Subjective:    Patient ID: Alicia [name], female    DOB: Feb 18, 1956, 55 y.o.   MRN: [id number]  HPI  1. Right thumb pain. Began 3 weeks ago after she prepared a large meal and lifting a heavy chicken, chopping and cutting. No trauma or specific injury. Woke up with pain over lateral thumb and has been bothering her with activities ever since. Specifically flexing the thumb. This improves with rest, but difficulty resting with small grand-children to pick up and play with. Is using aleve infrequently and ice which helps somewhat. Denies numbness, tingling, wrist pain, arm pain, swelling.   2. Left wrist pain. Does not seem to be bothering her as much now. Improved with the wrist splint and NSAIDs.   Review of Systems See HPI otherwise negative.    Objective:   Physical Exam  Vitals reviewed. Constitutional: She appears well-developed and well-nourished. No distress.  HENT:  Head: Normocephalic and atraumatic.  Musculoskeletal:       No swelling, erythema. No TTP over elbow, wrist, carpals,  radius/ulna, MCP. Pain on volar surface of thumb. Has pain with flexion of DIP joint only. No pain with extension of 1st digit or with radial or ulnar deviation. Strength and sensation intact.           Assessment & Plan:

## 2011-03-03 NOTE — Assessment & Plan Note (Signed)
Pain is isolated to DIP of thumb, exacerbated only by flexion. Will treat conservatively with scheduled naproxen x 7 days, continue ice prn, and will give splint to avoid continued exacerbation. Patient requests a note for light duty at work and will allow to avoid lifting heavy objects for next 2 weeks. Follow up if not improved in 2 weeks.

## 2011-03-10 ENCOUNTER — Ambulatory Visit (INDEPENDENT_AMBULATORY_CARE_PROVIDER_SITE_OTHER): Payer: Medicare (Managed Care) | Admitting: Ophthalmology

## 2011-03-24 ENCOUNTER — Encounter: Payer: Self-pay | Admitting: Family Medicine

## 2011-03-24 ENCOUNTER — Ambulatory Visit (INDEPENDENT_AMBULATORY_CARE_PROVIDER_SITE_OTHER): Payer: Medicare (Managed Care) | Admitting: Family Medicine

## 2011-03-24 DIAGNOSIS — M25532 Pain in left wrist: Secondary | ICD-10-CM

## 2011-03-24 DIAGNOSIS — M1810 Unilateral primary osteoarthritis of first carpometacarpal joint, unspecified hand: Secondary | ICD-10-CM

## 2011-03-24 DIAGNOSIS — M19049 Primary osteoarthritis, unspecified hand: Secondary | ICD-10-CM

## 2011-03-24 DIAGNOSIS — M25539 Pain in unspecified wrist: Secondary | ICD-10-CM

## 2011-03-24 NOTE — Assessment & Plan Note (Signed)
I suspect that her pain is coming from arthritis. Gout could be a possibility but her exam is not characteristic of it. She may benefit from ultrasound examination of her interphalangeal joints possibly would benefit from injection. Long-term nonsteroidal anti-inflammatories or not a good option for her as she has diabetes. I also recommended Tylenol arthritis as it is extended released acetaminophen that will provide better pain control. She will call sports medicine clinic and make an appointment.

## 2011-03-24 NOTE — Patient Instructions (Signed)
Thank you for coming in today. Try tylenol arthritis.  This is long acting pill. You take it 3 x a day.  Have a friend call [phone] to get an appointment at the sports medicine center.

## 2011-03-24 NOTE — Assessment & Plan Note (Signed)
I think likely arthritis. I doubt gout or wrist sprain. As I'm referring  to sports medicine for her thumb, she will also be seen for her wrist. She may benefit from her brace however I think that'll hinder her too much in sign language. She will followup with her PCP

## 2011-03-24 NOTE — Progress Notes (Signed)
Ms. Meaders presents to clinic today to followup her right thumb pain and left wrist pain. Her pain occurred approximately 6 weeks ago following that he never really big chicken and lifting grandchildren.    Right thumb pain: Noted in the initially distal interphalangeal joint and now in the proximal interphalangeal joint as well. She notes pain mostly when she flexes her thumb. She also has some pain when she opposes her thumb. She doesn't have much pain with abduction and adduction of her thumb. The pain is localized to the joints themselves. She denies any pain in her anatomical snuff box.  Pain occurs mostly with motion sometimes at rest. She is deaf fortunately this pain doesn't seem to interfere too much with her sign language area and she is taking Aleve off and on. She saw Dr. Cristal Ford 3 weeks ago who gave her one week of naproxen that did not help much.  Dr. Cristal Ford also prescribed a thumb spica brace that did not help.  Left wrist pain: Occurred at approximately the same time as her thumb pain. She describes pain in the middle of her plantar wrist. Pain occurs with all hand and arm motions. She denies any tingling into her hands.     PMH reviewed.  ROS as above otherwise neg Medications reviewed.  Exam:  BP 135/72  Pulse 85  Wt 147 lb 9.6 oz (66.951 kg) Gen: Well NAD MSK: Right thumb normal-appearing nontender no swelling and no erythema over the distal and proximal interphalangeal joints.  Significant reduction in mobility and flexion of both proximal and distal IP joints.  Pain with flexion.  Pain does not radiate.    Left wrist: Nontender normal range of motion some mild pain with range of motion. Finkelstein's, Phalen's and Tinel's  are negative.

## 2011-03-27 ENCOUNTER — Emergency Department (HOSPITAL_COMMUNITY): Payer: Worker's Compensation

## 2011-03-27 ENCOUNTER — Emergency Department (HOSPITAL_COMMUNITY)
Admission: EM | Admit: 2011-03-27 | Discharge: 2011-03-28 | Disposition: A | Discharge status: Home / Self Care | Payer: Worker's Compensation | Attending: Emergency Medicine | Admitting: Emergency Medicine

## 2011-03-27 DIAGNOSIS — Y9229 Other specified public building as the place of occurrence of the external cause: Secondary | ICD-10-CM | POA: Insufficient documentation

## 2011-03-27 DIAGNOSIS — H538 Other visual disturbances: Secondary | ICD-10-CM | POA: Insufficient documentation

## 2011-03-27 DIAGNOSIS — Y990 Civilian activity done for income or pay: Secondary | ICD-10-CM | POA: Insufficient documentation

## 2011-03-27 DIAGNOSIS — I10 Essential (primary) hypertension: Secondary | ICD-10-CM | POA: Insufficient documentation

## 2011-03-27 DIAGNOSIS — S7000XA Contusion of unspecified hip, initial encounter: Secondary | ICD-10-CM | POA: Insufficient documentation

## 2011-03-27 DIAGNOSIS — S8000XA Contusion of unspecified knee, initial encounter: Secondary | ICD-10-CM | POA: Insufficient documentation

## 2011-03-27 DIAGNOSIS — S060X0A Concussion without loss of consciousness, initial encounter: Secondary | ICD-10-CM | POA: Insufficient documentation

## 2011-03-27 DIAGNOSIS — E119 Type 2 diabetes mellitus without complications: Secondary | ICD-10-CM | POA: Insufficient documentation

## 2011-03-27 DIAGNOSIS — R296 Repeated falls: Secondary | ICD-10-CM | POA: Insufficient documentation

## 2011-03-27 DIAGNOSIS — H9190 Unspecified hearing loss, unspecified ear: Secondary | ICD-10-CM | POA: Insufficient documentation

## 2011-04-07 ENCOUNTER — Encounter: Payer: Self-pay | Admitting: Family Medicine

## 2011-04-07 ENCOUNTER — Ambulatory Visit (INDEPENDENT_AMBULATORY_CARE_PROVIDER_SITE_OTHER): Payer: Medicare (Managed Care) | Admitting: Family Medicine

## 2011-04-07 DIAGNOSIS — M19049 Primary osteoarthritis, unspecified hand: Secondary | ICD-10-CM

## 2011-04-07 DIAGNOSIS — M189 Osteoarthritis of first carpometacarpal joint, unspecified: Secondary | ICD-10-CM | POA: Insufficient documentation

## 2011-04-07 NOTE — Progress Notes (Signed)
  Subjective:    Patient ID: Alicia [name], female    DOB: 11-17-1955, 55 y.o.   MRN: [id number]  HPI Right thumb pain for the last several months. Gradually increasing in severity. Now is pretty constant 4-5/10 most of the time. Seems worse with movement, worse with certain weather conditions such as rainy weather. She has tried various over-the-counter medicines with not much improvement. She was placed in a brace for a while that seemed to make it worse. She is right-hand dominant.  Pain is in the large joints of the right thumb. She seen no redness of the thumb. PERTINENT  PMH / PSH: DM Deaf Diabetic neuropathy   ALLERGIES verified Review of Systems Does note some warmth of the thumb joint at times. This corresponds to when it's hurting most. She's had no fever, chills, unexpected weight gain or loss.    Objective:   Physical Exam  GENERAL: Well-developed female no acute distress THUMB: Right. Palpation at the [facility] joint. Full range of motion in all planes of the thumb joint. Normal strength. Skin is without any erythema or rash. She has normal sensation in Refill in the thumb. There is no external deformity noted. ULTRASOUND:  Several osteophytes noted at the [facility] joint on the right thumb. There is small effusion.  INJECTION: Patient was given informed consent, signed copy in the chart. Appropriate time out was taken. Area prepped and draped in usual sterile fashion. 1/2 cc of kenalog plus  1/2 cc of lidocaine was injected into the right CMC  using a(n) perpendicular approach. The patient tolerated the procedure well. There were no complications. Post procedure instructions were given.     Assessment & Plan:  R 1 CMC joint OA CSI today rtc 4 w

## 2011-04-07 NOTE — Patient Instructions (Signed)
We injected some corticosteroid into your right thumb joint today for arthritis. It should not get a whole lot more painful---it may be a little more painful tomorrow. It should not get red or hot or warm or look infected. If it does, we need to see you ASAP. Over the weekend you would have to go to an urgent care or the ED. I doubt this will happen! Let me see you in 4 weeks. In that time period, I would buy some over the counter aspercream---a generic brand is fine, and rub some on it twice a day. Call me if there are problems before I see you back

## 2011-04-11 ENCOUNTER — Ambulatory Visit: Payer: Medicare (Managed Care) | Admitting: Pharmacist

## 2011-04-18 ENCOUNTER — Ambulatory Visit (INDEPENDENT_AMBULATORY_CARE_PROVIDER_SITE_OTHER): Payer: Self-pay | Admitting: Ophthalmology

## 2011-04-20 ENCOUNTER — Telehealth: Payer: Self-pay | Admitting: *Deleted

## 2011-04-20 NOTE — Telephone Encounter (Signed)
I think patient should be evaluated as this is a new problem and hasn't been seen for this. May benefit from antibiotic therapy and course of prednisone, but we need to discuss risks/benefits and this is difficult to explain over the phone with interpretation. Can you please have her make an appointment?? Thanks

## 2011-04-20 NOTE — Telephone Encounter (Signed)
Patient calling today stating that he has been having sinus problems for over a month now; pressure, sneezing and migraine type headaches.  Was prescribed Flonase but states that it is not helping.  Wanting to know if there is anything else she can use.  Told her I would have to speak with Dr. Cristal Ford and would call her back.

## 2011-04-20 NOTE — Telephone Encounter (Signed)
Called and left a message with patient that she needs to be seen.  Told her to call us back and we would try to work her in tomorrow.

## 2011-04-21 ENCOUNTER — Encounter: Payer: Self-pay | Admitting: Pharmacist

## 2011-04-21 ENCOUNTER — Ambulatory Visit (INDEPENDENT_AMBULATORY_CARE_PROVIDER_SITE_OTHER): Payer: Medicare (Managed Care) | Admitting: Family Medicine

## 2011-04-21 ENCOUNTER — Ambulatory Visit (INDEPENDENT_AMBULATORY_CARE_PROVIDER_SITE_OTHER): Payer: Medicare (Managed Care) | Admitting: Pharmacist

## 2011-04-21 VITALS — BP 132/72 | HR 72 | Ht 64.5 in | Wt 146.8 lb

## 2011-04-21 DIAGNOSIS — E118 Type 2 diabetes mellitus with unspecified complications: Secondary | ICD-10-CM

## 2011-04-21 DIAGNOSIS — J329 Chronic sinusitis, unspecified: Secondary | ICD-10-CM

## 2011-04-21 DIAGNOSIS — I10 Essential (primary) hypertension: Secondary | ICD-10-CM

## 2011-04-21 DIAGNOSIS — E1165 Type 2 diabetes mellitus with hyperglycemia: Secondary | ICD-10-CM

## 2011-04-21 MED ORDER — METOPROLOL SUCCINATE ER 100 MG PO TB24
100.0000 mg | ORAL_TABLET | Freq: Every day | ORAL | Status: DC
Start: 2011-04-21 — End: 2012-01-29

## 2011-04-21 MED ORDER — FLUTICASONE PROPIONATE 50 MCG/ACT NA SUSP: NASAL | Status: DC

## 2011-04-21 MED ORDER — SALINE NASAL SPRAY 0.65 % NA SOLN: 1.0000 | Freq: Three times a day (TID) | NASAL | Status: DC

## 2011-04-21 MED ORDER — AZITHROMYCIN 250 MG PO TABS: ORAL_TABLET | ORAL | Status: AC

## 2011-04-21 NOTE — Assessment & Plan Note (Addendum)
Likely acute on chronic on presentation. Headache is likely secondary to this. Will double patient's Flonase dose to 2 sprays each nostril daily with recommendation of nasal saline before administration as to decrease irritation and headache. Patient also instructed to use 3 times a day nasal saline for nasal irrigation. Will place patient on short course of antibiotics with azithromycin given increased risk factors including diabetes and chronic infectious exposures with children.

## 2011-04-21 NOTE — Progress Notes (Signed)
  Subjective:    Patient ID: Alicia [name], female    DOB: Jan 13, 1956, 55 y.o.   MRN: [id number]  HPI  55 yoBF who is deaf arrives to clinic for diabetes follow-up with a sign language interpreter.  She is in good spirits, though she reports having allergy flares recently.  Pt is currently taking metformin 1000mg  BID, novolog sliding scale 2-4 units, and lantus 8 units SQ daily.  Her blood sugar log book shows fasting cBGS mostly 100-244, lunch, dinner, and bedtime cBGS mostly 100s with a few 200s, and two low cBGs of 65 and 77.    Upon review of medications, pt complains of headaches with flonase, and reports problems obtaining correct metoprolol prescription at her pharmacy  Pt's dose was supposed to be metoprolol tartrate 50mg  BID and new prescription for metoprolol succinate 100mg  sent to pharmacy.  Dr. Alvester Morin was requested to see patient for her sinus problems as her PCP was not available.   Review of Systems     Objective:   Physical Exam        Assessment & Plan:   Diabetes of many yrs duration currently under good control of blood glucose based on  . Lab Results  Component Value Date   HGBA1C 7.5 03/02/2011     ,home fasting CBG readings of 100s and lunch, dinner, and bedtime cBGS mostly 100s with a few 200s, and two low cBGs of 65 and 77.   Pt's weight is stable in the 140s, and her diet is well-controlled.  Lantus and novolog doses are well-balanced, and no changes were made at this visit.  Pt able to verbalize appropriate hypoglycemia management plan.    Written patient instructions provided with emphasis to call office if more than 1 low cBG occurs per month.  Follow up in  Pharmacist Clinic Visit in [place].   TTFFC 40 minutes.  Patient seen with Adrian Blackwater, PharmD Candidate and Haynes Hoehn, Pharmacy Resident

## 2011-04-21 NOTE — Assessment & Plan Note (Signed)
Diabetes of many yrs duration currently under good control of blood glucose based on  . Lab Results  Component Value Date   HGBA1C 7.5 03/02/2011     ,home fasting CBG readings of 100s and lunch, dinner, and bedtime cBGS mostly 100s with a few 200s, and two low cBGs of 65 and 77.   Pt's weight is stable in the 140s, and her diet is well-controlled.  Lantus and novolog doses are well-balanced, and no changes were made at this visit.  Pt able to verbalize appropriate hypoglycemia management plan.    Written patient instructions provided with emphasis to call office if more than 1 low cBG occurs per month.  Follow up in  Pharmacist Clinic Visit in [place].   TTFFC 40 minutes.  Patient seen with Adrian Blackwater, PharmD Candidate and Haynes Hoehn, Pharmacy Resident

## 2011-04-21 NOTE — Progress Notes (Signed)
  Subjective:    Patient ID: Alicia [name], female    DOB: 1956/02/25, 55 y.o.   MRN: [id number]  HPI This is a work in visit for headache. Patient was seen in pharmacy clinic today by Dr. Raymondo Band for diabetic management when patient reports she's had chronic headache. I was asked to see patient because of this. Patient states she's had chronic frontal headache over the last 2-3 months. Patient has a background history of recurrent sinusitis. Patient states that headache is more so in sinus distribution, with involvement of the maxillary and frontal sinuses. 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 Patient states she takes Flonase on a regular basis however this also seems to cause headache. Patient currently is on Zyrtec. Patient has been on Singulair in the past however she stopped because of cough. No fevers no increased work of breathing positive nasal congestion as well as rhinorrhea. Rhinorrhea is intermittently purulent in nature.    Review of Systems See HPI     Objective:   Physical Exam Gen: up in chair, NAD HEENT: NCAT, EOMI, TMs clear bilaterally, nasal erythema and swelling bilaterally, + post oropharyngeal erythema, + bilateral maxillary and frontal sinus TTP.  CV: RRR, no murmurs auscultated PULM: CTAB, no wheezes, rales, rhoncii ABD: S/NT/+ bowel sounds  EXT: 2+ peripheral pulses   Assessment & Plan:

## 2011-04-21 NOTE — Patient Instructions (Signed)
It was great to see you today!  Your blood sugars look great.  Continue your insulin medicine as scheduled, and please come see me in early January.  However, if you have more than one low blood sugar per month, please call me , and we will adjust your insulin schedule.   Have a great day!

## 2011-04-21 NOTE — Patient Instructions (Addendum)
It was good to meet you today You have acute on chronic sinusitis I have doubled your Flonase dose, use nasal saline before each use. I have also prescribed you antibiotics.  Continue to use zyrtec. Follow up with your regular doctor as needed for this.  Call with any questions,  God Bless,  Doree Albee MD   Sinusitis Sinuses are air pockets within the bones of your face. The growth of bacteria within a sinus leads to infection. The infection prevents the sinuses from draining. This infection is called sinusitis. SYMPTOMS  There will be different areas of pain depending on which sinuses have become infected.  The maxillary sinuses often produce pain beneath the eyes.   Frontal sinusitis may cause pain in the middle of the forehead and above the eyes.  Other problems (symptoms) include:  Toothaches.   Colored, pus-like (purulent) drainage from the nose.   Swelling, warmth, and tenderness over the sinus areas may be signs of infection.  TREATMENT  Sinusitis is most often determined by an exam.X-rays may be taken. If x-rays have been taken, make sure you obtain your results or find out how you are to obtain them. Your caregiver may give you medications (antibiotics). These are medications that will help kill the bacteria causing the infection. You may also be given a medication (decongestant) that helps to reduce sinus swelling.  HOME CARE INSTRUCTIONS   Only take over-the-counter or prescription medicines for pain, discomfort, or fever as directed by your caregiver.   Drink extra fluids. Fluids help thin the mucus so your sinuses can drain more easily.   Applying either moist heat or ice packs to the sinus areas may help relieve discomfort.   Use saline nasal sprays to help moisten your sinuses. The sprays can be found at your local drugstore.  SEEK IMMEDIATE MEDICAL CARE IF:  You have a fever.   You have increasing pain, severe headaches, or toothache.   You have nausea,  vomiting, or drowsiness.   You develop unusual swelling around the face or trouble seeing.  MAKE SURE YOU:   Understand these instructions.   Will watch your condition.   Will get help right away if you are not doing well or get worse.  Document Released: 06/19/2005 Document Revised: 03/01/2011 Document Reviewed: 01/16/2007 [facility] Patient Information 2012 [place], [place].

## 2011-04-22 NOTE — Progress Notes (Signed)
  Subjective:    Patient ID: Alicia [name], female    DOB: 02/12/1956, 55 y.o.   MRN: [id number]  HPI Reviewed and agree with Dr. Koval's management.    Review of Systems     Objective:   Physical Exam        Assessment & Plan:   

## 2011-04-25 ENCOUNTER — Ambulatory Visit: Payer: Self-pay | Admitting: Family Medicine

## 2011-05-02 ENCOUNTER — Other Ambulatory Visit: Payer: Self-pay | Admitting: Family Medicine

## 2011-05-03 NOTE — Telephone Encounter (Signed)
Refill request

## 2011-05-04 ENCOUNTER — Other Ambulatory Visit: Payer: Self-pay | Admitting: Family Medicine

## 2011-05-04 NOTE — Telephone Encounter (Signed)
Refill request

## 2011-05-05 ENCOUNTER — Ambulatory Visit (INDEPENDENT_AMBULATORY_CARE_PROVIDER_SITE_OTHER): Payer: Self-pay | Admitting: Family Medicine

## 2011-05-05 VITALS — BP 126/81

## 2011-05-05 DIAGNOSIS — M1810 Unilateral primary osteoarthritis of first carpometacarpal joint, unspecified hand: Secondary | ICD-10-CM

## 2011-05-05 DIAGNOSIS — M19049 Primary osteoarthritis, unspecified hand: Secondary | ICD-10-CM

## 2011-05-05 MED ORDER — DICLOFENAC SODIUM 75 MG PO TBEC: 75.0000 mg | DELAYED_RELEASE_TABLET | Freq: Two times a day (BID) | ORAL | Status: DC | PRN

## 2011-05-05 NOTE — Assessment & Plan Note (Signed)
Injection of the IP joint today.  Added diclofenac prn for breakthrough pain.  F/u prn.

## 2011-05-05 NOTE — Progress Notes (Signed)
  Subjective:    Patient ID: Alicia [name], female    DOB: 12-18-1955, 55 y.o.   MRN: [id number]  HPI Interview and procedure completed with use of deaf interpretor 55 y/o female is here for follow up for thumb  arthritis.  The right cmc was injected at her last visit.  She no longer has pain at the cmc but she continues to have pain of the interphalangeal joint on the right thumb.  She has tried tylenol arthritis and aspercreme.  Neither improves the pain.  The pain is worse in the am and with activity.     Review of Systems Pertinent review of systems: negative for fever or unusual weight change.    Objective:   Physical Exam  Right hand: No swelling or deformity No tenderness to palpation of the CMC Tenderness to palpation over the interphalangeal joint ROM normal Peripherally neurovascularly intact  Interphalangeal joint injection under ultrasound guidance:  Consent obtained and verified. Sterile betadine prep. Furthur cleansed with alcohol. Sterile ultrasound gel applied Interphalangeal joint visualized by ultrasound Topical analgesic spray: Ethyl chloride. Joint: Right thumb interphalangeal joint Completed without difficulty Meds: 5mg  depo medrol, 1/8 ml of lidocaine (1/4 cc 40 mg / cc medroxyprogesterone) Needle:30g Aftercare instructions and Red flags advised.      Assessment & Plan:

## 2011-05-08 ENCOUNTER — Other Ambulatory Visit: Payer: Self-pay | Admitting: Family Medicine

## 2011-05-09 NOTE — Telephone Encounter (Signed)
Refill request

## 2011-05-10 ENCOUNTER — Encounter: Payer: Self-pay | Admitting: Family Medicine

## 2011-06-21 ENCOUNTER — Ambulatory Visit (INDEPENDENT_AMBULATORY_CARE_PROVIDER_SITE_OTHER): Payer: Self-pay | Admitting: Ophthalmology

## 2011-06-22 ENCOUNTER — Encounter: Payer: Self-pay | Admitting: Family Medicine

## 2011-06-22 ENCOUNTER — Ambulatory Visit (INDEPENDENT_AMBULATORY_CARE_PROVIDER_SITE_OTHER): Payer: No Typology Code available for payment source | Admitting: Family Medicine

## 2011-06-22 VITALS — BP 130/72 | HR 81 | Temp 97.5°F | Ht 64.5 in | Wt 149.9 lb

## 2011-06-22 DIAGNOSIS — M6740 Ganglion, unspecified site: Secondary | ICD-10-CM

## 2011-06-22 DIAGNOSIS — Z23 Encounter for immunization: Secondary | ICD-10-CM

## 2011-06-22 HISTORY — DX: Ganglion, unspecified site: M67.40

## 2011-06-22 NOTE — Patient Instructions (Signed)
You have a cyst over your tendon  It does not look like an infection or  a spider bite.  You can use ice and ace-compression if it helps it feels better.  It may come and go, if it becomes red, very painful or infected or other concerns, please return for re-evaluation

## 2011-06-22 NOTE — Assessment & Plan Note (Signed)
Likely ganglion cyst, resolving with minimal symptoms, advised continued monitoring, may use ice or compression if she feels it helps.  Given red flags for re-evaluation.

## 2011-06-22 NOTE — Progress Notes (Signed)
  Subjective:    Patient ID: Alicia [name], female    DOB: 02/21/1956, 55 y.o.   MRN: [id number]  HPI 55 yo here for same day appt to evaluate less than 1 week of right foot bump  States bump came up on her right foot, sore.  No erythema or history of trauma or overuse.  Slowly went down over the next few days without treatment.  Came in today because was concerned it may be a spider bite.  No longer hurts but may be sore if she wears tight sneakers.   Review of Systems See above    Objective:   Physical Exam GEN: Alert & Oriented, No acute distress, here with sign language interpreter Right foot:  approx 1.5 cm soft, cystic mass over proximal 1st metatarsal over tendon.overlying skin normal with no tenderness to palpation.        Assessment & Plan:

## 2011-07-11 ENCOUNTER — Encounter: Payer: Self-pay | Admitting: *Deleted

## 2011-07-18 ENCOUNTER — Ambulatory Visit (INDEPENDENT_AMBULATORY_CARE_PROVIDER_SITE_OTHER): Payer: No Typology Code available for payment source | Admitting: Ophthalmology

## 2011-07-18 DIAGNOSIS — H2510 Age-related nuclear cataract, unspecified eye: Secondary | ICD-10-CM

## 2011-07-18 DIAGNOSIS — E11359 Type 2 diabetes mellitus with proliferative diabetic retinopathy without macular edema: Secondary | ICD-10-CM

## 2011-07-18 DIAGNOSIS — H43819 Vitreous degeneration, unspecified eye: Secondary | ICD-10-CM

## 2011-07-18 DIAGNOSIS — E1139 Type 2 diabetes mellitus with other diabetic ophthalmic complication: Secondary | ICD-10-CM

## 2011-07-21 ENCOUNTER — Encounter: Payer: Self-pay | Admitting: Family Medicine

## 2011-07-21 ENCOUNTER — Ambulatory Visit (INDEPENDENT_AMBULATORY_CARE_PROVIDER_SITE_OTHER): Payer: No Typology Code available for payment source | Admitting: Family Medicine

## 2011-07-21 VITALS — BP 139/72 | HR 76 | Temp 97.7°F | Ht 64.5 in | Wt 148.0 lb

## 2011-07-21 DIAGNOSIS — E11319 Type 2 diabetes mellitus with unspecified diabetic retinopathy without macular edema: Secondary | ICD-10-CM | POA: Insufficient documentation

## 2011-07-21 DIAGNOSIS — M6740 Ganglion, unspecified site: Secondary | ICD-10-CM

## 2011-07-21 DIAGNOSIS — E118 Type 2 diabetes mellitus with unspecified complications: Secondary | ICD-10-CM

## 2011-07-21 DIAGNOSIS — E1165 Type 2 diabetes mellitus with hyperglycemia: Secondary | ICD-10-CM

## 2011-07-21 DIAGNOSIS — I10 Essential (primary) hypertension: Secondary | ICD-10-CM

## 2011-07-21 DIAGNOSIS — E7800 Pure hypercholesterolemia, unspecified: Secondary | ICD-10-CM

## 2011-07-21 DIAGNOSIS — M19049 Primary osteoarthritis, unspecified hand: Secondary | ICD-10-CM

## 2011-07-21 DIAGNOSIS — E1139 Type 2 diabetes mellitus with other diabetic ophthalmic complication: Secondary | ICD-10-CM

## 2011-07-21 DIAGNOSIS — E162 Hypoglycemia, unspecified: Secondary | ICD-10-CM

## 2011-07-21 LAB — CBC
HCT: 38.7 % (ref 36.0–46.0)
Hemoglobin: 12.5 g/dL (ref 12.0–15.0)
MCH: 29.5 pg (ref 26.0–34.0)
MCHC: 32.3 g/dL (ref 30.0–36.0)
MCV: 91.3 fL (ref 78.0–100.0)
Platelets: 333 10*3/uL (ref 150–400)
RBC: 4.24 MIL/uL (ref 3.87–5.11)
RDW: 14 % (ref 11.5–15.5)
WBC: 6 10*3/uL (ref 4.0–10.5)

## 2011-07-21 LAB — POCT GLYCOSYLATED HEMOGLOBIN (HGB A1C): Hemoglobin A1C: 7.6

## 2011-07-21 NOTE — Progress Notes (Signed)
  Subjective:    Patient ID: Alicia [name], female    DOB: 1955/12/25, 56 y.o.   MRN: [id number]  HPI  1. Diabetes. Patient reports some symptomatic hypoglycemic episodes occurring in the middle of the night. She brings glucose log and has recorded several nights between 1 AM and 4 AM values ranging from 34-44. Has racing heartbeat and sees flashes in her vision. She normally eats a snack or drinks orange juice and returns to sleep. Fasting CBGs ranged between 69 and 349. Patient uses a sliding scale for NovoLog and which she takes between one and 4 units with each meal. Dinnertime is usually at 6pm and some days eats a nighttime snack. She also takes Lantus 8 units every morning. Denies syncope, polyuria, polydipsia, skin lesions.   2. HTN. Patient is taking her BP medications as prescribed. On review of medication left she is taking both parents and naproxen for hand arthritis. Patient is unsure if she is taking both meds medications. Denies chest pain, edema, visual changes.  3. Diabetic retinopathy.  Patient recently saw ophthalmologist who recommended followup in 6 months. May need laser therapy in the future but none currently indicated.  4. cyst on foot. This waxes and wanes. Improves when she wears shoes that do not irritate the area. No pain or skin breakdown.  Review of Systems See HPI otherwise negativie.     Objective:   Physical Exam  Vitals reviewed. Constitutional: She is oriented to person, place, and time. She appears well-developed and well-nourished. No distress.  HENT:  Head: Normocephalic and atraumatic.  Mouth/Throat: Oropharynx is clear and moist.  Eyes: EOM are normal. Pupils are equal, round, and reactive to light.  Cardiovascular: Normal rate, regular rhythm, normal heart sounds and intact distal pulses.  Exam reveals no gallop.   No murmur heard. Pulmonary/Chest: Effort normal and breath sounds normal. No respiratory distress. She has no wheezes. She has no rales.   Abdominal: Soft.  Musculoskeletal: She exhibits no edema and no tenderness.       Right dorsum of midfoot with discrete nodule/cyst over flexor tendon. No erythema, skin changes. Nontender.  Neurological: She is alert and oriented to person, place, and time. Coordination normal.  Skin: She is not diaphoretic.  Psychiatric: She has a normal mood and affect.       Assessment & Plan:

## 2011-07-21 NOTE — Patient Instructions (Signed)
Your A1c is at goal today. You have low blood sugar at night causing faster heartbeat. Try to eat a small snack prior to bedtime or decrease dinner insulin by 1 unit. Will send letter about lab results. Do not take voltaren and naproxen at same time, you must pick one or the other to prevent kidney damage. Make an appointment with Dr. Raymondo Band in one month to discuss insulin if still having low blood sugar. Make an appointment with me in 3 months for physical.

## 2011-07-22 LAB — COMPREHENSIVE METABOLIC PANEL
ALT: 8 U/L (ref 0–35)
AST: 12 U/L (ref 0–37)
Albumin: 4.5 g/dL (ref 3.5–5.2)
Alkaline Phosphatase: 72 U/L (ref 39–117)
BUN: 14 mg/dL (ref 6–23)
CO2: 29 mEq/L (ref 19–32)
Calcium: 10.9 mg/dL — ABNORMAL HIGH (ref 8.4–10.5)
Chloride: 100 mEq/L (ref 96–112)
Creat: 0.48 mg/dL — ABNORMAL LOW (ref 0.50–1.10)
Glucose, Bld: 98 mg/dL (ref 70–99)
Potassium: 3.5 mEq/L (ref 3.5–5.3)
Sodium: 141 mEq/L (ref 135–145)
Total Bilirubin: 0.3 mg/dL (ref 0.3–1.2)
Total Protein: 7.3 g/dL (ref 6.0–8.3)

## 2011-07-22 LAB — HEPATITIS C ANTIBODY: HCV Ab: NEGATIVE

## 2011-07-22 LAB — LDL CHOLESTEROL, DIRECT: Direct LDL: 124 mg/dL — ABNORMAL HIGH

## 2011-07-22 NOTE — Assessment & Plan Note (Signed)
Declining insulin requirements since weight loss through exercise. See above for plan. If still having episodes will decrease lantus at next visit.

## 2011-07-22 NOTE — Assessment & Plan Note (Signed)
Patient has both naproxen and voltaren at home. Have advised she choose one and discard the other. She uses only intermittently but warned against long term use and risk of renal effects in diabetic patient.

## 2011-07-22 NOTE — Assessment & Plan Note (Signed)
Will check LDL. Patient has demonstrated intolerance to at least one class of statin.

## 2011-07-22 NOTE — Assessment & Plan Note (Signed)
A1c at goal range. Major issue is hypoglycemia on several nights in past month. Patient is symptomatic and awakens from sleep. Have advised patient to eat a small carb-containing bedtime snack each night and to decrease her sliding scale regimen by one unit for evening meal. She will continue to monitor CBGs and have advised to bring glucometer and log to Pharmacy clinic in next 3-4 weeks. If problem continues may need decrease of lantus even further. Will check labs today.

## 2011-07-22 NOTE — Assessment & Plan Note (Signed)
At goal today. WIll check labs and follow up in 2 months.

## 2011-07-22 NOTE — Assessment & Plan Note (Signed)
Continue using non-irritation shoes. May need ortho referral if becomes more bothersome.

## 2011-07-25 ENCOUNTER — Encounter: Payer: Self-pay | Admitting: Family Medicine

## 2011-07-28 ENCOUNTER — Ambulatory Visit: Payer: No Typology Code available for payment source | Admitting: Pharmacist

## 2011-08-29 ENCOUNTER — Ambulatory Visit (INDEPENDENT_AMBULATORY_CARE_PROVIDER_SITE_OTHER): Payer: No Typology Code available for payment source | Admitting: Pharmacist

## 2011-08-29 VITALS — BP 134/79 | HR 80 | Ht 65.0 in | Wt 152.2 lb

## 2011-08-29 DIAGNOSIS — E1165 Type 2 diabetes mellitus with hyperglycemia: Secondary | ICD-10-CM

## 2011-08-29 NOTE — Progress Notes (Signed)
  Subjective:    Patient ID: Alicia [name], female    DOB: 02-01-1956, 56 y.o.   MRN: [id number]  HPI Patient presents to the clinic with her interpreter Latoya. She states that she is currently under stress due to social issues/reasons. I reviewed allergies and medications with her. She states that her blood glucose meter has been acting strangely lately so we recorded the 14-day average=133 and the 30-day average=150 and then we reset her meter. She subsequently checked her blood glucose and found it to be 70mg /dL and mentions having blurry vision and not feeling well; I provided her with two pieces of candy to bring her glucose back up to the normal range.     Review of Systems Filed Vitals:   08/29/11 1046  BP: 134/79  Pulse: 80       Objective:   Physical Exam        Assessment & Plan:   Diabetes of long standing duration currently under good control of blood glucose based on  . Lab Results  Component Value Date   HGBA1C 7.6 07/21/2011     ,14-day average=133, and 30-day average=150. Control is suboptimal due to getting some low BG readings (40's,50's, was 70 today in the clinic and the patient said she was feeling hypoglycemic). Able to verbalize appropriate hypoglycemia management plan. Decreased dose of basal insulin Lantus (insulin glargine) to 6 units daily in the morning. Patient will continue current NovoLog regime. Written patient instructions provided. Discussed possible weight loss tactics with a goal weight of 140's.  Follow up in Pharmacist Clinic Visit in one month. Total time in face to face counseling 20 minutes.  Patient seen with Su Hilt, PharmD Candidate and Benjaman Pott, Pharmacy Resident.

## 2011-08-29 NOTE — Patient Instructions (Addendum)
It was very nice meeting you.   The only change we are making today is to reduce the amount of Lantus you are using to 6 units (injecting it at the same time as you have been doing).   Please continue to use the NovoLog as you have been.  Thank You!

## 2011-08-29 NOTE — Assessment & Plan Note (Signed)
Diabetes of long standing duration currently under good control of blood glucose based on  . Lab Results  Component Value Date   HGBA1C 7.6 07/21/2011     ,14-day average=133, and 30-day average=150. Control is suboptimal due to getting some low BG readings (40's,50's, was 70 today in the clinic and the patient said she was feeling hypoglycemic). Able to verbalize appropriate hypoglycemia management plan. Decreased dose of basal insulin Lantus (insulin glargine) to 6 units daily in the morning. Patient will continue current NovoLog regime. Written patient instructions provided. Discussed possible weight loss tactics with a goal weight of 140's.  Follow up in Pharmacist Clinic Visit in one month. Total time in face to face counseling 20 minutes.  Patient seen with Su Hilt, PharmD Candidate and Benjaman Pott, Pharmacy Resident.

## 2011-08-29 NOTE — Progress Notes (Signed)
  Subjective:    Patient ID: Alicia [name], female    DOB: 07/22/1955, 55 y.o.   MRN: [id number]  HPI Reviewed and agree with Dr. Koval's management.    Review of Systems     Objective:   Physical Exam        Assessment & Plan:   

## 2011-09-02 ENCOUNTER — Other Ambulatory Visit: Payer: Self-pay | Admitting: Family Medicine

## 2011-09-03 NOTE — Telephone Encounter (Signed)
Refill request

## 2011-09-06 ENCOUNTER — Other Ambulatory Visit: Payer: Self-pay | Admitting: Family Medicine

## 2011-09-06 MED ORDER — HYDROCHLOROTHIAZIDE 25 MG PO TABS: 12.5000 mg | ORAL_TABLET | Freq: Every day | ORAL | Status: DC

## 2011-09-11 ENCOUNTER — Other Ambulatory Visit: Payer: Self-pay | Admitting: Family Medicine

## 2011-09-11 DIAGNOSIS — Z1231 Encounter for screening mammogram for malignant neoplasm of breast: Secondary | ICD-10-CM

## 2011-09-26 ENCOUNTER — Ambulatory Visit (INDEPENDENT_AMBULATORY_CARE_PROVIDER_SITE_OTHER): Payer: No Typology Code available for payment source | Admitting: Pharmacist

## 2011-09-26 VITALS — BP 131/73 | HR 88 | Temp 97.6°F | Wt 147.8 lb

## 2011-09-26 DIAGNOSIS — E118 Type 2 diabetes mellitus with unspecified complications: Secondary | ICD-10-CM

## 2011-09-26 DIAGNOSIS — E1165 Type 2 diabetes mellitus with hyperglycemia: Secondary | ICD-10-CM

## 2011-09-26 NOTE — Assessment & Plan Note (Signed)
Diabetes of many yrs duration currently under good control of blood glucose based on   Lab Results  Component Value Date   HGBA1C 7.6 07/21/2011    ,home fasting CBG readings of usually in mid 100's with 2 readings <60 and random CBG readings of150's. Control is suboptimal due to occasional lows.  Able to communicate appropriate hypoglycemia management plan. Continued basal insulin Lantus (insulin glargine) 6 units daily. Decreased dose of rapid insulin Novolog (insulin aspart) to by 1 unit on days days that she plans exercises (jst a reduction from the sliding scale doses).  Reinforced need to avoid "eating late or skipping meals" Written patient instructions provided.  Follow up in  Pharmacist Clinic Visit after next visit with Dr. Cristal Ford.   Total time in face to face counseling 35 minutes.  Patient seen with .Birdena Crandall, PharmD Candidate.

## 2011-09-26 NOTE — Patient Instructions (Signed)
It was a pleasure to see you today!  -On days of planned significant exercise, reduce planned dose of Novolog by 1 unit of current regimen  -Continue to take Lantus as directed -Follow up with Dr Cristal Ford at April visit for your A1c

## 2011-09-26 NOTE — Progress Notes (Signed)
  Subjective:    Patient ID: Alicia [name], female    DOB: December 14, 1955, 56 y.o.   MRN: [id number]  HPI  Patient arrives in clinic accompanied by interpreter (sign language) [name].   She reports doing well, remains adherent with drug therapy and has been active working out.    Review of Systems     Objective:   Physical Exam  Home CBG readings consistently in good range with a few notable low readings.  Most likely related to exercise and inadequate dietary intake       Assessment & Plan:   Diabetes of many yrs duration currently under good control of blood glucose based on   Lab Results  Component Value Date   HGBA1C 7.6 07/21/2011    ,home fasting CBG readings of usually in mid 100's with 2 readings <60 and random CBG readings of150's. Control is suboptimal due to occasional lows.  Able to communicate appropriate hypoglycemia management plan. Continued basal insulin Lantus (insulin glargine) 6 units daily. Decreased dose of rapid insulin Novolog (insulin aspart) to by 1 unit on days days that she plans exercises (jst a reduction from the sliding scale doses).  Reinforced need to avoid "eating late or skipping meals" Written patient instructions provided.  Follow up in  Pharmacist Clinic Visit after next visit with Dr. Cristal Ford.   Total time in face to face counseling 35 minutes.  Patient seen with .Birdena Crandall, PharmD Candidate.

## 2011-10-02 NOTE — Progress Notes (Signed)
  Subjective:    Patient ID: Alicia [name], female    DOB: 07/31/1955, 55 y.o.   MRN: [id number]  HPI Reviewed and agree with Dr. Koval's management.    Review of Systems     Objective:   Physical Exam        Assessment & Plan:   

## 2011-10-03 ENCOUNTER — Encounter (HOSPITAL_COMMUNITY): Payer: Self-pay

## 2011-10-03 ENCOUNTER — Emergency Department (HOSPITAL_COMMUNITY)
Admission: EM | Admit: 2011-10-03 | Discharge: 2011-10-04 | Disposition: A | Discharge status: Home / Self Care | Payer: No Typology Code available for payment source | Attending: Emergency Medicine | Admitting: Emergency Medicine

## 2011-10-03 ENCOUNTER — Telehealth: Payer: Self-pay | Admitting: Family Medicine

## 2011-10-03 ENCOUNTER — Emergency Department (HOSPITAL_COMMUNITY): Payer: No Typology Code available for payment source

## 2011-10-03 DIAGNOSIS — Z7982 Long term (current) use of aspirin: Secondary | ICD-10-CM | POA: Insufficient documentation

## 2011-10-03 DIAGNOSIS — I10 Essential (primary) hypertension: Secondary | ICD-10-CM | POA: Insufficient documentation

## 2011-10-03 DIAGNOSIS — Z79899 Other long term (current) drug therapy: Secondary | ICD-10-CM | POA: Insufficient documentation

## 2011-10-03 DIAGNOSIS — K3184 Gastroparesis: Secondary | ICD-10-CM | POA: Insufficient documentation

## 2011-10-03 DIAGNOSIS — Z794 Long term (current) use of insulin: Secondary | ICD-10-CM | POA: Insufficient documentation

## 2011-10-03 DIAGNOSIS — E1149 Type 2 diabetes mellitus with other diabetic neurological complication: Secondary | ICD-10-CM | POA: Insufficient documentation

## 2011-10-03 DIAGNOSIS — E785 Hyperlipidemia, unspecified: Secondary | ICD-10-CM | POA: Insufficient documentation

## 2011-10-03 DIAGNOSIS — E1142 Type 2 diabetes mellitus with diabetic polyneuropathy: Secondary | ICD-10-CM | POA: Insufficient documentation

## 2011-10-03 DIAGNOSIS — H9190 Unspecified hearing loss, unspecified ear: Secondary | ICD-10-CM | POA: Insufficient documentation

## 2011-10-03 DIAGNOSIS — S4990XA Unspecified injury of shoulder and upper arm, unspecified arm, initial encounter: Secondary | ICD-10-CM

## 2011-10-03 DIAGNOSIS — M79609 Pain in unspecified limb: Secondary | ICD-10-CM | POA: Insufficient documentation

## 2011-10-03 NOTE — Telephone Encounter (Addendum)
Patient states through video relay interpretor that for a few days she has had a sweet taste in her mouth. Has a pasty feeling in mouth.  Reports a coating on tongue. Explained that she will need appointment .Offered appointment tomorrow but she  Cannot come then.  Requests appointment on Friday 04/05.  Appointment scheduled.  Is is now noted that patient has appointment at [facility] at 8:45 she had thought it was at 10:15. Message left for patient to call back to clarify.  Will also forward message to Dr. Cristal Ford.

## 2011-10-03 NOTE — Telephone Encounter (Signed)
error 

## 2011-10-03 NOTE — ED Notes (Signed)
Pt complains of right arm pain, she fell and hit the middle of her upper arm on the step

## 2011-10-04 NOTE — Telephone Encounter (Signed)
Contacted patient . Her [facility] appointment is at 8:45 and she will come here at 10:00.  Interpretor scheduled .

## 2011-10-04 NOTE — Discharge Instructions (Signed)
Your xray done today did not show any broken bones.  I recommend taking ibuprofen for the pain and applying ice to the area.    Be sure to read and understand instructions below prior to leaving the hospital. If your symptoms persist without any improvement in 1 week it is recommended that you follow up with orthopedics listed above.  Keep all follow-up appointments with an orthopedic specialist to have follow-up X-rays, because as we discussed fractures may not appear until 3 days after the acute injury.   TREATMENT  Rest, ice, elevation, and compression are the basic modes of treatment.    HOME CARE INSTRUCTIONS  Apply ice to the sore area for 15 to 20 minutes, 3 to 4 times per day. Do this while you are awake for the first 2 days, or as directed. This can be stopped when the swelling goes away. Put the ice in a plastic bag and place a towel between the bag of ice and your skin.  Keep your arm elevated above your heart when possible to lessen swelling.  ACTIVITY- Exercises should be limited to pain free range of motion  SEEK IMMEDIATE MEDICAL CARE IF:  Your fingers are swollen, very red, white, or cold and blue.  Your fingers are numb or tingling.  You have increasing pain.  You have difficulty moving your fingers.   HOW TO MAKE AN ICE PACK  To make an ice pack, do one of the following:  Place crushed ice or a bag of frozen vegetables in a sealable plastic bag. Squeeze out the excess air. Place this bag inside another plastic bag. Slide the bag into a pillowcase or place a damp towel between your skin and the bag.  Mix 3 parts water with 1 part rubbing alcohol. Freeze the mixture in a sealable plastic bag. When you remove the mixture from the freezer, it will be slushy. Squeeze out the excess air. Place this bag inside another plastic bag. Slide the bag into a pillowcase or place a damp towel between your skin

## 2011-10-04 NOTE — ED Provider Notes (Signed)
Medical screening examination/treatment/procedure(s) were performed by non-physician practitioner and as supervising physician I was immediately available for consultation/collaboration.  Bettyanne Dittman M Jayleen Afonso, MD 10/04/11 0700 

## 2011-10-04 NOTE — ED Provider Notes (Signed)
History     CSN: [id number]  Arrival date & time 10/03/11  2208   First MD Initiated Contact with Patient 10/04/11 0013      Chief Complaint  Patient presents with  . Arm Pain    right    (Consider location/radiation/quality/duration/timing/severity/associated sxs/prior treatment) HPI Comments: History obtained by patient and her daughter.  Patient is deaf.  Patient's daughter used sign language and translated.  Approximately 1 hour prior to arrival patient tripped over a toy and fell.  Her right upper arm hit a step.  She is having swelling and bruising of her mid right upper arm since that time.  Patient is a 56 y.o. female presenting with arm injury. The history is provided by the patient (daughter). The history is limited by a language barrier.  Arm Injury  The pain is moderate. Pertinent negatives include no numbness, no vomiting, no inability to bear weight, no focal weakness and no loss of consciousness. There have been no prior injuries to these areas.    Past Medical History  Diagnosis Date  . Diabetes mellitus   . Hypertension   . Hyperlipidemia   . Gastroparesis   . Diabetic neuropathy   . GERD (gastroesophageal reflux disease)   . Complete deafness     Meningitis at age 57  . S/P appy   . Deaf   . H/O: C-section     Past Surgical History  Procedure Date  . Appendectomy   . Tubal ligation   . Cesarean section   . Uterine fibroid embolization 2007  . Cardiolyte ef 77%, no ischemia in 2006 2006  . Wrist surgery     Cyst removed on left    Family History  Problem Relation Age of Onset  . Hypertension Mother   . Cancer Maternal Aunt   . Cancer Maternal Grandmother     History  Substance Use Topics  . Smoking status: Never Smoker   . Smokeless tobacco: Never Used  . Alcohol Use: No    OB History    Grav Para Term Preterm Abortions TAB SAB Ect Mult Living                  Review of Systems  Gastrointestinal: Negative for vomiting.    Neurological: Negative for focal weakness, loss of consciousness and numbness.    Allergies  Lipitor; Ramipril; and Sulfonamide derivatives  Home Medications   Current Outpatient Rx  Name Route Sig Dispense Refill  . AMLODIPINE BESYLATE 10 MG PO TABS Oral Take 1 tablet (10 mg total) by mouth daily. 30 tablet 11  . ASPIRIN 81 MG PO CHEW Oral Chew 81 mg by mouth daily.      Marland Kitchen CALCIUM CARBONATE-VITAMIN D 600-400 MG-UNIT PO CHEW Oral Chew 1 tablet by mouth daily.    Marland Kitchen DICLOFENAC SODIUM 75 MG PO TBEC Oral Take 1 tablet (75 mg total) by mouth 2 (two) times daily as needed. 60 tablet 2  . ERGOCALCIFEROL 2000 UNITS PO TABS Oral Take 1 tablet by mouth daily.    Marland Kitchen FERROUS SULFATE 325 (65 FE) MG PO TABS Oral Take 1 tablet (325 mg total) by mouth daily with breakfast.  3  . FLUTICASONE PROPIONATE 50 MCG/ACT NA SUSP  2 sprays to each nostril daily. Use nasal saline before use. 16 g 11  . GABAPENTIN 300 MG PO CAPS  TAKE 1 CAPSULE FOUR TIMES A DAY 120 capsule 4  . HYDROCHLOROTHIAZIDE 25 MG PO TABS Oral Take 0.5 tablets (12.5 mg  total) by mouth daily. 30 tablet 5  . INSULIN ASPART 100 UNIT/ML [place] SOLN  For Breakfast, lunch and dinner: If pre-meal blood glucose is <150 give 0 units, if 150-200 give 2 units, if 201-250 give 3 units, and if >250 give 4 units. 10 mL 11  . INSULIN GLARGINE 100 UNIT/ML [place] SOLN Subcutaneous Inject 6 Units into the skin at bedtime.    Marland Kitchen LOSARTAN POTASSIUM 100 MG PO TABS Oral Take 1 tablet (100 mg total) by mouth daily. 90 tablet 3  . METFORMIN HCL 1000 MG PO TABS  TAKE 1 TABLET BY MOUTH TWO TIMES DAILY. 60 tablet 5  . METOPROLOL SUCCINATE ER 100 MG PO TB24 Oral Take 1 tablet (100 mg total) by mouth daily. 30 tablet 11  . POLYETHYLENE GLYCOL 3350 PO POWD Oral Take 17 g by mouth 3 (three) times a week. Take as needed for constipation.  Mix with 8 oz of water. Disp 1 large container 527 g 5    BP 133/70  Pulse 95  Temp(Src) 97.8 F (36.6 C) (Oral)  Resp 20  SpO2  98%  Physical Exam  Nursing note and vitals reviewed. Constitutional: She appears well-developed and well-nourished. No distress.  HENT:  Head: Normocephalic and atraumatic.  Cardiovascular: Normal rate, regular rhythm and normal heart sounds.   Pulses:      Radial pulses are 2+ on the right side, and 2+ on the left side.  Pulmonary/Chest: Effort normal and breath sounds normal.  Musculoskeletal:       Right shoulder: She exhibits no tenderness, no bony tenderness, no effusion, no deformity and normal pulse.       Right elbow: She exhibits normal range of motion, no swelling and no effusion. no tenderness found.       Arms:      Mid humerus tender to palpation.   Neurological: She is alert. No sensory deficit. Gait normal.       Normal grip strength bilaterally  Skin: Skin is warm and dry. She is not diaphoretic.  Psychiatric: She has a normal mood and affect.    ED Course  Procedures (including critical care time)  Labs Reviewed - No data to display Dg Humerus Right  10/03/2011  *RADIOLOGY REPORT*  Clinical Data: Larey Seat.  Right arm pain.  RIGHT HUMERUS - 2+ VIEW  Comparison: None  Findings: The shoulder and elbow joints are maintained.  No humeral shaft fracture.  IMPRESSION: No acute bony findings.  Original Report Authenticated By: P. Loralie Champagne, M.D.     No diagnosis found.    MDM  Patient with negative xray.  Neurovascularly intact.  Patient instructed to follow up with PCP.  RICE treatment discussed with patient.  Return precautions discussed.  Pascal Lux [place], PA-C 10/04/11 [id number]

## 2011-10-06 ENCOUNTER — Ambulatory Visit (INDEPENDENT_AMBULATORY_CARE_PROVIDER_SITE_OTHER): Payer: No Typology Code available for payment source | Admitting: Sports Medicine

## 2011-10-06 ENCOUNTER — Encounter: Payer: Self-pay | Admitting: Sports Medicine

## 2011-10-06 ENCOUNTER — Ambulatory Visit (INDEPENDENT_AMBULATORY_CARE_PROVIDER_SITE_OTHER): Payer: No Typology Code available for payment source | Admitting: Family Medicine

## 2011-10-06 VITALS — BP 122/73 | HR 78 | Temp 97.9°F | Ht 64.0 in | Wt 148.0 lb

## 2011-10-06 DIAGNOSIS — E1165 Type 2 diabetes mellitus with hyperglycemia: Secondary | ICD-10-CM

## 2011-10-06 DIAGNOSIS — R196 Halitosis: Secondary | ICD-10-CM | POA: Insufficient documentation

## 2011-10-06 DIAGNOSIS — M19049 Primary osteoarthritis, unspecified hand: Secondary | ICD-10-CM

## 2011-10-06 DIAGNOSIS — R6889 Other general symptoms and signs: Secondary | ICD-10-CM

## 2011-10-06 DIAGNOSIS — IMO0002 Reserved for concepts with insufficient information to code with codable children: Secondary | ICD-10-CM

## 2011-10-06 DIAGNOSIS — E118 Type 2 diabetes mellitus with unspecified complications: Secondary | ICD-10-CM

## 2011-10-06 MED ORDER — BIOTENE PBF DRY MOUTH MT LIQD: 1.0000 | Freq: Two times a day (BID) | OROMUCOSAL | Status: DC

## 2011-10-06 NOTE — Assessment & Plan Note (Signed)
Pager for four-month history of bad breath and bad taste.  Has not tried any oral rinses.  Does not tolerate Listerine.  Will try Biotene twice a day.  Patient encouraged to see dentist for further evaluation as has been greater than 2 years.  Most likely due to bacterial periodontal disease however other differential would include reflux, postnasal drip, oropharyngeal candidiasis.  Consider addition of antifungal mouthwash if not improving or addition of PPI.

## 2011-10-06 NOTE — Progress Notes (Signed)
HPI:  Alicia [name] is a 56 y.o. female presenting today for evaluation of a bad sour taste in her mouth every morning.  She reports trying to rinse it with water without improvement.  She reports it has been present for one month.  Reports it is not getting better or getting worse.  Reports no fevers no chills, no cough or no congestion, no reflux like symptoms, no congestion, allergy symptoms in last week however this has been present longer she does not associate these 2 things...  Has not tried any medicines for this.  Reports brushing her teeth twice daily.  No routine use of mouthwash.  Has not seen a dentist in greater than 2 years.  Has poor dentition.  No nausea vomiting, occasional diarrhea however no hematochezia or melena.  Has had one prior episode similar to this that spontaneously resolved however she isn't sure of timeframe.  No dental pain,   ROS Per history of present illness History is obtained via the sign language interpreter  Past Medical Hx Reviewed: yes - significant for diabetes is moderately controlled; no prior fungal infections Medications Reviewed: yes - negative for any recent antibiotic use Family History Reviewed: yes  PE: GENERAL:  Adult AA  female.  Examined in [facility].  Sitting comfortably on exam table  In no discomfort; norespiratory distress.   PSYCH: Alert and appropriately interactive HNEENT: Atraumatic, normocephalic OROPHARYNX: normal findings: lips normal without lesions, buccal mucosa normal, palate normal, tongue midline and normal, oropharynx pink & moist without lesions or evidence of thrush and  and abnormal findings: dentition: 2 pockets and right posterior aspect do not appear to be infected with abscess, gingivitis and periodontitis EYES: Extraocular muscles intact, no scleral icterus EARS: Normal to placement, patient is deaf NOSE: normal THORAX: HEART: RRR, S1/S2 heard, no murmur LUNGS: CTA B, no wheezes, no crackles

## 2011-10-06 NOTE — Assessment & Plan Note (Signed)
Patient's last Hb A1c 7.  So considered immunosuppressed he could be at risk for fungal infection.  Patient to recheck with Dr. Vicki Mallet next week.

## 2011-10-06 NOTE — Patient Instructions (Signed)
It was nice to see you today.  .    Today we discussed:  The taste in your mouth each morning.  I suspect that this is coming from some of the areas where you had your teeth break.  There is most likely bacteria in this area and we will need to treat this with a mouthwash called by Biotene.  I would like for you to use this twice a day.  I have sent in a prescription to your pharmacy.  Please plan to return to see Dr. Vicki Mallet as previously scheduled. If you need anything prior to  returning please call the clinic.

## 2011-10-06 NOTE — Progress Notes (Signed)
  Subjective:    Patient ID: Alicia [name], female    DOB: August 12, 1955, 56 y.o.   MRN: [id number]  HPI  She is here with an interpreter for the deaf, Kyung Bacca. Returns with right thumb pain. The injection really helped her. That was about 4-1/2 months ago. Over the last one month she's had significant return of her pain. 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 Anytime she has to pick up a child or change diapers she is having 7-8/10 pain. At rest she has wanted to have pain. She is right-hand dominant. She tried using the brace and that did not work very well for her at work and it did not seem to help her pain much. The injection really took the pain 95% away for several months. She has otherwise been fairly well.  Review of Systems    denies unusual weight change, fever, sweats, chills. Objective:   Physical Exam  Vital signs reviewed. GENERAL: Well developed, well nourished, no acute distress : RIGHT. PALPATION AT THE [facility] JOINT. SOME STIFFNESS NOTED IN FLEXION AT THE IP JOINT. THERE IS NO DEFORMITY, NO WARMTH, NO ERYTHEMA. THERE ARE NO SKIN LESIONS OF THE THUMB. THE REST OF THE FINGERS APPEAR NORMAL. INJECTION: Patient was given informed consent, signed copy in the chart. Appropriate time out was taken. Area prepped and draped in usual sterile fashion. One half cc of methylprednisolone 40 mg/ml plus  one half cc of 1% lidocaine without epinephrine was injected into the right CMC joint using a(n) perpendicular approach. The patient tolerated the procedure well. There were no complications. Post procedure instructions were given.       Assessment & Plan:  #1. Severe CMC arthritis. We had previously ultrasounded this. She had a lot of improvement with the corticosteroid injection so we tried that again today. Hopefully she'll get another 4-5 months at this point. We did discuss long-term treatment. When she starts to need shots closer together when they are not helping her, I  would probably refer her for joint replacement. She still fairly young for that and she would like to avoid it as long as possible.

## 2011-10-10 ENCOUNTER — Encounter: Payer: No Typology Code available for payment source | Admitting: Family Medicine

## 2011-10-13 ENCOUNTER — Ambulatory Visit: Payer: No Typology Code available for payment source | Admitting: Family Medicine

## 2011-10-16 ENCOUNTER — Ambulatory Visit (HOSPITAL_COMMUNITY)
Admission: RE | Admit: 2011-10-16 | Discharge: 2011-10-16 | Disposition: A | Discharge status: Home / Self Care | Payer: No Typology Code available for payment source | Source: Ambulatory Visit | Attending: Family Medicine | Admitting: Family Medicine

## 2011-10-16 DIAGNOSIS — Z1231 Encounter for screening mammogram for malignant neoplasm of breast: Secondary | ICD-10-CM | POA: Insufficient documentation

## 2011-10-17 ENCOUNTER — Ambulatory Visit (INDEPENDENT_AMBULATORY_CARE_PROVIDER_SITE_OTHER): Payer: No Typology Code available for payment source | Admitting: Family Medicine

## 2011-10-17 ENCOUNTER — Other Ambulatory Visit (HOSPITAL_COMMUNITY)
Admission: RE | Admit: 2011-10-17 | Discharge: 2011-10-17 | Disposition: A | Discharge status: Home / Self Care | Payer: No Typology Code available for payment source | Source: Ambulatory Visit | Attending: Family Medicine | Admitting: Family Medicine

## 2011-10-17 ENCOUNTER — Encounter: Payer: Self-pay | Admitting: Family Medicine

## 2011-10-17 VITALS — BP 126/76 | HR 74 | Temp 97.7°F | Ht 64.0 in | Wt 144.8 lb

## 2011-10-17 DIAGNOSIS — N760 Acute vaginitis: Secondary | ICD-10-CM

## 2011-10-17 DIAGNOSIS — E1165 Type 2 diabetes mellitus with hyperglycemia: Secondary | ICD-10-CM

## 2011-10-17 DIAGNOSIS — E7800 Pure hypercholesterolemia, unspecified: Secondary | ICD-10-CM

## 2011-10-17 DIAGNOSIS — Z124 Encounter for screening for malignant neoplasm of cervix: Secondary | ICD-10-CM

## 2011-10-17 LAB — POCT GLYCOSYLATED HEMOGLOBIN (HGB A1C): Hemoglobin A1C: 7.6

## 2011-10-17 LAB — POCT WET PREP (WET MOUNT)

## 2011-10-17 MED ORDER — ROSUVASTATIN CALCIUM 10 MG PO TABS: 10.0000 mg | ORAL_TABLET | Freq: Every day | ORAL | Status: DC

## 2011-10-17 NOTE — Patient Instructions (Signed)
Will call about test results or send a letter if normal. Start low dose cholesterol medicine-[facility]. Make an appointment in 3 months for diabetes check up. A1c is good today-7.6. Keep up the good work, regular exercise and healthy diet!!

## 2011-10-17 NOTE — Progress Notes (Signed)
  Subjective:    Alicia [name] is a 56 y.o. female who presents for an annual exam. The patient has no complaints today. The patient is not sexually active. Menopause at age 27, no bleeding since then. She does have a sticky vaginal discharge noticed. No pain or purulence. GYN screening history: last pap: approximate date 2010 and was normal. Last mammogram was yesterday and normal. The patient wears seatbelts: yes. The patient participates in regular exercise: yes. Has the patient ever been transfused or tattooed?: not asked. The patient reports that there is not domestic violence in her life.   LDL 124 off of statin-lipitor stopped for muscle cramps.  Menstrual History: OB History    Grav Para Term Preterm Abortions TAB SAB Ect Mult Living                 No LMP recorded. Patient is postmenopausal.    The following portions of the patient's history were reviewed and updated as appropriate: allergies, current medications, past family history, past medical history, past social history, past surgical history and problem list.  Review of Systems Pertinent items are noted in HPI.  Was recently seen in ED for shoulder pain that is resolved. Having loose stools recently, no abdominal pain or bleeding noted.    Objective:    BP 126/76  Pulse 74  Temp 97.7 F (36.5 C)  Ht 5\' 4"  (1.626 m)  Wt 144 lb 12.8 oz (65.681 kg)  BMI 24.85 kg/m2  General Appearance:    Alert, cooperative, no distress, appears stated age  Head:    Normocephalic, without obvious abnormality, atraumatic  Eyes:    PERRL, conjunctiva/corneas clear, EOM's intact  Ears:    Normal TM's and external ear canals, both ears  Nose:   Nares normal, septum midline, mucosa normal, no drainage    or sinus tenderness  Throat:   Lips, mucosa, and tongue normal; teeth and gums normal  Neck:   Supple, symmetrical, trachea midline, no adenopathy;    thyroid:  no enlargement/tenderness/nodules;    Back:     Symmetric, no curvature,  ROM normal  Lungs:     Clear to auscultation bilaterally, respirations unlabored  Chest Wall:    No tenderness or deformity   Heart:    Regular rate and rhythm, S1 and S2 normal, no murmur, rub   or gallop     Abdomen:     Soft, non-tender, bowel sounds active all four quadrants,    no masses, no organomegaly  Genitalia:    Normal female without lesion, discharge or tenderness. Scant white discharge.     Extremities:   Extremities normal, atraumatic, no cyanosis or edema  Pulses:   2+ and symmetric all extremities  Skin:   Skin color, texture, turgor normal, no rashes or lesions     Neurologic:   CNII-XII intact, normal strength, sensation and reflexes    throughout  .    Assessment:    Healthy female exam.  Hypercholesterolemia.    Plan:     Await pap smear results. Follow up in 3 months. Wet prep. Will start crestor for LDL 124 above goal. monitor for side effects as lipitor believed to have caused muscle cramps.

## 2011-10-19 ENCOUNTER — Encounter: Payer: Self-pay | Admitting: Family Medicine

## 2011-10-25 ENCOUNTER — Telehealth: Payer: Self-pay | Admitting: Family Medicine

## 2011-10-25 NOTE — Telephone Encounter (Signed)
Patient was in last week and forgot to ask some questions.  She is calling wanting to speak to the Triage nurse.

## 2011-10-25 NOTE — Telephone Encounter (Signed)
Patient states she forgot to mention to MD about cramping she has been having for a year in both calves. Has throughout night , has daily, sometimes both legs at same time.  Whenever she has been in  ofiice she forgets to tell doctor. Advised will send message to MD to advise. Will send to Dr. Louanne Belton in Dr. Ernest Haber absence

## 2011-10-25 NOTE — Telephone Encounter (Signed)
Patient is working this afternoon . Will call her tomorrow AM .

## 2011-10-25 NOTE — Telephone Encounter (Signed)
There can be multiple reasons for leg cramps and, unfortunately, many of them do not have a good treatment.  She does really need a dedicated office visit for this.  My recommendation would be that she schedule an appointment with Dr. Cristal Ford sometime in the next month to specifically discuss that.

## 2011-10-26 NOTE — Telephone Encounter (Signed)
Contacted patient and appointment scheduled for 11/09/2011 with PCP. Advised if she feels she needs to come earlier  to call for work in appointment. States she would prefer to see PCP.

## 2011-11-01 ENCOUNTER — Other Ambulatory Visit: Payer: Self-pay | Admitting: Family Medicine

## 2011-11-08 ENCOUNTER — Other Ambulatory Visit: Payer: Self-pay | Admitting: Family Medicine

## 2011-11-09 ENCOUNTER — Ambulatory Visit (INDEPENDENT_AMBULATORY_CARE_PROVIDER_SITE_OTHER): Payer: No Typology Code available for payment source | Admitting: Family Medicine

## 2011-11-09 ENCOUNTER — Encounter: Payer: Self-pay | Admitting: Family Medicine

## 2011-11-09 VITALS — BP 128/75 | HR 74 | Temp 97.7°F | Ht 64.0 in | Wt 141.7 lb

## 2011-11-09 DIAGNOSIS — R252 Cramp and spasm: Secondary | ICD-10-CM

## 2011-11-09 LAB — COMPREHENSIVE METABOLIC PANEL
ALT: 9 U/L (ref 0–35)
AST: 12 U/L (ref 0–37)
Albumin: 4.2 g/dL (ref 3.5–5.2)
Alkaline Phosphatase: 69 U/L (ref 39–117)
BUN: 13 mg/dL (ref 6–23)
CO2: 28 mEq/L (ref 19–32)
Calcium: 10.8 mg/dL — ABNORMAL HIGH (ref 8.4–10.5)
Chloride: 101 mEq/L (ref 96–112)
Creat: 0.51 mg/dL (ref 0.50–1.10)
Glucose, Bld: 139 mg/dL — ABNORMAL HIGH (ref 70–99)
Potassium: 3.5 mEq/L (ref 3.5–5.3)
Sodium: 139 mEq/L (ref 135–145)
Total Bilirubin: 0.3 mg/dL (ref 0.3–1.2)
Total Protein: 7 g/dL (ref 6.0–8.3)

## 2011-11-09 LAB — CBC
HCT: 36.6 % (ref 36.0–46.0)
Hemoglobin: 11.8 g/dL — ABNORMAL LOW (ref 12.0–15.0)
MCH: 29.8 pg (ref 26.0–34.0)
MCHC: 32.2 g/dL (ref 30.0–36.0)
MCV: 92.4 fL (ref 78.0–100.0)
Platelets: 322 10*3/uL (ref 150–400)
RBC: 3.96 MIL/uL (ref 3.87–5.11)
RDW: 13.2 % (ref 11.5–15.5)
WBC: 4.9 10*3/uL (ref 4.0–10.5)

## 2011-11-09 LAB — MAGNESIUM: Magnesium: 1.4 mg/dL — ABNORMAL LOW (ref 1.5–2.5)

## 2011-11-09 LAB — TSH: TSH: 1.96 u[IU]/mL (ref 0.350–4.500)

## 2011-11-09 NOTE — Patient Instructions (Signed)
Most of the time we can't find a reason for leg cramps. We will check potassium, magnesium and calcium. You can start with taking a B complex vitamin daily. I will let you know if you need any additional supplements. Be sure to stay hydrated and eat plenty of potassium-bananas, tomatoes, potato skins. Return if not improved in 2 weeks.  Leg Cramps Leg cramps that occur during exercise can be caused by poor circulation or dehydration. However, muscle cramps that occur at rest or during the night are usually not due to any serious medical problem. Heat cramps may cause muscle spasms during hot weather.  CAUSES There is no clear cause for muscle cramps. However, dehydration may be a factor for those who do not drink enough fluids and those who exercise in the heat. Imbalances in the level of sodium, potassium, calcium or magnesium in the muscle tissue may also be a factor. Some medications, such as water pills (diuretics), may cause loss of chemicals that the body needs (like sodium and potassium) and cause muscle cramps. TREATMENT   Make sure your diet has enough fluids and essential minerals for the muscle to work normally.   Avoid strenuous exercise for several days if you have been having frequent leg cramps.   Stretch and massage the cramped muscle for several minutes.   Some medicines may be helpful in some patients with night cramps. Only take over-the-counter or prescription medicines as directed by your caregiver.  SEEK IMMEDIATE MEDICAL CARE IF:   Your leg cramps become worse.   Your foot becomes cold, numb, or blue.  Document Released: 07/27/2004 Document Revised: 06/08/2011 Document Reviewed: 07/14/2008 [facility] Patient Information 2012 [place], [place].

## 2011-11-12 NOTE — Progress Notes (Signed)
  Subjective:    Patient ID: Alicia [name], female    DOB: [birth year], 56 y.o.   MRN: [id number]  HPI  1. Leg cramps. Patient having these for > 1 year. Previously she was having generalized myalgias and cramping and malaise that resolved after discontinuation of lipitor. She alerts me to that her leg cramps have never resolved during her statin-free period or currently (she is on crestor now). Notices bilateral calf cramps mainly at night and early morning. No new exercises or specific to legs. She does hula for exercise and walking on normal surfaces. No change with movement, walking. No pain in feet, joints, muscle loss. No weakness, swelling, trauma, redness or cramps elsewhere.   Review of Systems See HPI otherwise negative.  reports that she has never smoked. She has never used smokeless tobacco.    Objective:   Physical Exam  Vitals reviewed. Constitutional: She is oriented to person, place, and time. She appears well-developed and well-nourished. No distress.  HENT:  Head: Normocephalic and atraumatic.  Cardiovascular: Normal rate, regular rhythm and normal heart sounds.   Pulmonary/Chest: Effort normal.  Musculoskeletal: Normal range of motion. She exhibits no edema and no tenderness.       No musculature TTP. Good muscle tone and bulk and strength bilaterally.  Neurological: She is alert and oriented to person, place, and time.  Skin: No rash noted.  Psychiatric: Her behavior is normal.        Assessment & Plan:

## 2011-11-12 NOTE — Assessment & Plan Note (Signed)
No red flags to suggest ischemia or claudication, seems likely isolated nocturnal calf cramps. Discussed with patient several possible etiologies: electrolytes, metabolic, idiopathic. Noted a lower potassium in the past. Will recheck lytes, magnesium, hgb level. Have advised starting with vitamin B complex pending lab results. Will give trial of magnesium/potassium replacement . F/u in one month.

## 2011-11-13 ENCOUNTER — Other Ambulatory Visit: Payer: Self-pay | Admitting: Family Medicine

## 2011-11-14 ENCOUNTER — Telehealth: Payer: Self-pay | Admitting: Family Medicine

## 2011-11-14 NOTE — Telephone Encounter (Signed)
Called patient, left message via interpretation service: magnesium was low and her potassium was still low. Her cramps may improve if she starts taking OTC magnesium and potassium supplements. If she needs an rx, i can send one but should be available OTC. F/u in 3 weeks if not improved.

## 2011-11-24 ENCOUNTER — Ambulatory Visit: Payer: No Typology Code available for payment source | Admitting: Family Medicine

## 2011-12-06 ENCOUNTER — Other Ambulatory Visit: Payer: Self-pay | Admitting: Family Medicine

## 2011-12-19 ENCOUNTER — Encounter: Payer: Self-pay | Admitting: Family Medicine

## 2011-12-19 ENCOUNTER — Ambulatory Visit (INDEPENDENT_AMBULATORY_CARE_PROVIDER_SITE_OTHER): Payer: No Typology Code available for payment source | Admitting: Family Medicine

## 2011-12-19 VITALS — BP 118/70 | HR 66 | Temp 98.1°F | Ht 64.0 in | Wt 144.0 lb

## 2011-12-19 DIAGNOSIS — R519 Headache, unspecified: Secondary | ICD-10-CM | POA: Insufficient documentation

## 2011-12-19 DIAGNOSIS — R51 Headache: Secondary | ICD-10-CM

## 2011-12-19 MED ORDER — SODIUM CHLORIDE 0.65 % NA SOLN: 1.0000 | NASAL | Status: DC | PRN

## 2011-12-19 MED ORDER — LORATADINE-PSEUDOEPHEDRINE ER 5-120 MG PO TB12: 1.0000 | ORAL_TABLET | Freq: Two times a day (BID) | ORAL | Status: DC

## 2011-12-19 NOTE — Assessment & Plan Note (Signed)
A: sinus headache mostly likely allergic. No evidence of bacterial superinfection. History and exam not consistent with migraine.  P: -Claritin-D as needed, advised patient to limit use to as little as possible to avoid elevating BP -nasal saline -reviewed s/s that should prompt return to care per AVS.

## 2011-12-19 NOTE — Progress Notes (Signed)
Subjective:     Patient ID: Alicia [name], female   DOB: 04/08/1956, 56 y.o.   MRN: [id number]  HPI Patient is deaf. History: obtained from patient via ASL interpreter.   56 yo F presents with a complaint of headache x 5 days. She was in her normal state of health when she went out Friday afternoon about town. When she returned home she started to have headache, congestion, mild sore throat and non-productive cough. She states that the most significant symptom is the headaches which is diffuse, sharp and throbbing.  She denies head trauma and sick contacts. She denies similar symptoms previously. She admits to allergies. She tried tylenol and motrin with only mild and temporary relief. She went to work with he headache yesterday. She is missing work today.  SocHx: patient is a non-smoker.    Review of Systems As per HPI     Objective:   Physical Exam BP 118/70  Pulse 66  Temp 98.1 F (36.7 C) (Oral)  Ht 5\' 4"  (1.626 m)  Wt 144 lb (65.318 kg)  BMI 24.72 kg/m2 General appearance: alert, cooperative and no distress Head: Normocephalic, without obvious abnormality, atraumatic Eyes: conjunctivae/corneas clear. L pupil RRL, R pupil dilated and non-reactive. EOM's intact. Ears: normal TM's and external ear canals both ears Nose: turbinates pink, swollen Throat: lips, mucosa, and tongue normal; teeth and gums normal Neck: no adenopathy, no carotid bruit, no JVD, supple, symmetrical, trachea midline and thyroid not enlarged, symmetric, no tenderness/mass/nodules Resp:clear, normal work of breathing.  Neurologic: Grossly normal  Assessment and Plan:

## 2011-12-19 NOTE — Patient Instructions (Signed)
Meliyah,  Thank you for coming in to see me today. For your headache which appears to be allergic sinusitis please do the following: 1. Take claritin-D as needed for symptom up to twice a day. Take as little as possible because the decongestant in it can raise BP.  2. Use nasal saline to flush out your nose.  3. Use warm salt water to gargle. 4. Tylenol as needed for pain up to 4 gm per day.   Watch out for worsening pain and fever as these may be signs of progression from allergic to bacterial sinusitis which is treated with antibiotics.   F/u as needed.  Dr. Armen Pickup

## 2011-12-19 NOTE — Telephone Encounter (Signed)
This encounter was created in error - please disregard.

## 2011-12-19 NOTE — Telephone Encounter (Signed)
error 

## 2011-12-20 ENCOUNTER — Telehealth: Payer: Self-pay | Admitting: Family Medicine

## 2011-12-20 NOTE — Telephone Encounter (Signed)
Patient is calling to speak to the nurse about her medication Claritin D, she is not able to afford it and her insurance does not cover.  She is hoping for something different that her insurance will cover.  [facility] [address].

## 2011-12-21 NOTE — Telephone Encounter (Signed)
Will forward to pcp for change.Alicia [name] [place]

## 2011-12-22 ENCOUNTER — Other Ambulatory Visit: Payer: Self-pay | Admitting: Family Medicine

## 2011-12-22 MED ORDER — FEXOFENADINE-PSEUDOEPHED ER 60-120 MG PO TB12: 1.0000 | ORAL_TABLET | Freq: Two times a day (BID) | ORAL | Status: DC

## 2011-12-22 NOTE — Telephone Encounter (Signed)
Please call patient to inform her that I sent in allegra-D instead-it is generic. If that doesn't work, tell her to call the insurance company and figure out which one is covered. These are OTC medications also.

## 2011-12-25 NOTE — Telephone Encounter (Signed)
Called and was unable to get in contact with pt lm with relay services to give pt this message.Loralee Pacas [employer]

## 2012-01-08 ENCOUNTER — Ambulatory Visit: Payer: No Typology Code available for payment source | Admitting: Pharmacist

## 2012-01-08 ENCOUNTER — Ambulatory Visit: Payer: No Typology Code available for payment source | Admitting: Family Medicine

## 2012-01-11 ENCOUNTER — Ambulatory Visit: Payer: No Typology Code available for payment source | Admitting: Family Medicine

## 2012-01-15 ENCOUNTER — Encounter: Payer: Self-pay | Admitting: Family Medicine

## 2012-01-15 ENCOUNTER — Ambulatory Visit (INDEPENDENT_AMBULATORY_CARE_PROVIDER_SITE_OTHER): Payer: No Typology Code available for payment source | Admitting: Family Medicine

## 2012-01-15 VITALS — BP 128/70 | HR 76 | Temp 98.2°F | Ht 64.0 in | Wt 142.0 lb

## 2012-01-15 DIAGNOSIS — M6740 Ganglion, unspecified site: Secondary | ICD-10-CM

## 2012-01-15 DIAGNOSIS — M25552 Pain in left hip: Secondary | ICD-10-CM | POA: Insufficient documentation

## 2012-01-15 DIAGNOSIS — M25559 Pain in unspecified hip: Secondary | ICD-10-CM

## 2012-01-15 MED ORDER — NAPROXEN 500 MG PO TABS: 500.0000 mg | ORAL_TABLET | Freq: Two times a day (BID) | ORAL | Status: DC

## 2012-01-15 NOTE — Progress Notes (Signed)
Subjective:     Patient ID: Alicia [name], female   DOB: 1956/05/01, 56 y.o.   MRN: [id number]  HPI Alicia [name] is a 56 y/o female with a complicated medical history who comes to clinic today with pain in her left hip. Visit conducted with ASL interpreter.   She states the pain started in her left hip about 3 weeks ago. She first felt it while she was lifting a box at work. She describes the pain as throbbing, and states that it is relatively constant. It does not radiate. It is worse with sitting, especially on soft surfaces. She does not feel any pain with walking, standing, or going up stairs.   She does have history of osteoarthritis, but says this does not feel similar to prior OA episodes.   Her only other complaint today is a bump on the inside of her right foot. She first noticed it about a month ago. It is non-painful, and she is only concerned because it has been increasing in size recently.   She denies fever, chills, sweats, or troubles walking.   Review of Systems As noted per above.     Objective:   Physical Exam General: Well appearing, no acute distress MSK: Full Range of motion in left hip and left knee. There is mild pain with internal and external rotation of the left hip. There is slight tenderness to palpation over the lateral region of her left gluteus.  Full range of motion in the right hip and knee, no pain with internal or external rotation of the right hip. There is a 1 cm cyst present on medial aspect of her right foot. It is mobile, non-tender, non-erythematous, and not warm to the touch.     Assessment:    Plan:

## 2012-01-15 NOTE — Assessment & Plan Note (Signed)
Right foot ganlgion cyst.  Advised do nothing vs aspiration.  She prefers to wear shoes that do not irritate that area and watch for now.

## 2012-01-15 NOTE — Patient Instructions (Addendum)
Muscle Strain A muscle strain, or pulled muscle, occurs when a muscle is over-stretched. A small number of muscle fibers may also be torn. This is especially common in athletes. This happens when a sudden violent force placed on a muscle pushes it past its capacity. Usually, recovery from a pulled muscle takes 1 to 2 weeks. But complete healing will take 5 to 6 weeks. There are millions of muscle fibers. Following injury, your body will usually return to normal quickly. HOME CARE INSTRUCTIONS   While awake, apply ice to the sore muscle for 15 to 20 minutes each hour for the first 2 days. Put ice in a plastic bag and place a towel between the bag of ice and your skin.   Do not use the pulled muscle for several days. Do not use the muscle if you have pain.   You may wrap the injured area with an elastic bandage for comfort. Be careful not to bind it too tightly. This may interfere with blood circulation.   Only take over-the-counter or prescription medicines for pain, discomfort, or fever as directed by your caregiver. Do not use aspirin as this will increase bleeding (bruising) at injury site.   Warming up before exercise helps prevent muscle strains.  SEEK MEDICAL CARE IF:  There is increased pain or swelling in the affected area. MAKE SURE YOU:   Understand these instructions.   Will watch your condition.   Will get help right away if you are not doing well or get worse.  Document Released: 06/19/2005 Document Revised: 06/08/2011 Document Reviewed: 01/16/2007 [employer] Patient Information 2012 [employer]. 

## 2012-01-15 NOTE — Assessment & Plan Note (Signed)
Mild pain. Improving.  Advised supportive care, NSAIDS, stretching.  Given red flags for follow-up/

## 2012-01-15 NOTE — Progress Notes (Signed)
  Subjective:    Patient ID: Alicia [name], female    DOB: [birth year], 56 y.o.   MRN: [id number]  HPIwork in for left hip and and foot pain.   Left hip pain:  Thinks she first noticed when lifting a box at work but cannot directly link it to a particular incident.  Throbbing pain. No radicular pain.   Intermittent.  Not improved with OTC NSAIDs.  Worse when driving or sitting on pillows.  Better when sitting on a hard chair.  No fever, chills.    Bump on right foot:  Duration x 1 month.  Medial aspect of right foot.  Not painful.  Sometimes rubs against shoes.  Feels it is getting bigger.  I have reviewed patient's  PMH, FH, and Social history and Medications as related to this visit.    Review of Systemssee HPI     Objective:   Physical Exam GEN: normal, using sign language interpreter MSK:  Normal faber.  Neg straight leg raise, non-tender over trochanteric bursitis. Foot: approx 1 cm mobile mass on medial right foot- consistent with ganglion cyst       Assessment & Plan:

## 2012-01-19 ENCOUNTER — Ambulatory Visit (INDEPENDENT_AMBULATORY_CARE_PROVIDER_SITE_OTHER): Payer: No Typology Code available for payment source | Admitting: Ophthalmology

## 2012-01-26 ENCOUNTER — Encounter: Payer: Self-pay | Admitting: Family Medicine

## 2012-01-26 ENCOUNTER — Ambulatory Visit (INDEPENDENT_AMBULATORY_CARE_PROVIDER_SITE_OTHER): Payer: No Typology Code available for payment source | Admitting: Family Medicine

## 2012-01-26 VITALS — BP 132/75 | HR 75 | Temp 97.1°F | Ht 64.0 in | Wt 135.8 lb

## 2012-01-26 DIAGNOSIS — I10 Essential (primary) hypertension: Secondary | ICD-10-CM

## 2012-01-26 DIAGNOSIS — E1165 Type 2 diabetes mellitus with hyperglycemia: Secondary | ICD-10-CM

## 2012-01-26 DIAGNOSIS — E118 Type 2 diabetes mellitus with unspecified complications: Secondary | ICD-10-CM

## 2012-01-26 DIAGNOSIS — E7800 Pure hypercholesterolemia, unspecified: Secondary | ICD-10-CM

## 2012-01-26 DIAGNOSIS — F4321 Adjustment disorder with depressed mood: Secondary | ICD-10-CM

## 2012-01-26 LAB — POCT GLYCOSYLATED HEMOGLOBIN (HGB A1C): Hemoglobin A1C: 7.6

## 2012-01-26 NOTE — Assessment & Plan Note (Signed)
At goal today. Continue metoprolol, Norvasc, Cozaar. Followup 3 months

## 2012-01-26 NOTE — Progress Notes (Signed)
  Subjective:    Patient ID: Alicia [name], female    DOB: 03/29/1956, 56 y.o.   MRN: [id number]  HPI  1. Grief. Patient is grieving, crying, poor appetite and poor sleep. She and her husband decided to in their marriage of 31 years 3 days ago. States they have been having problems for a while, she has not felt comfortable in a relationship but was trying to work it out. Children are very supportive of her and happy that the marriage has ended. They live in [place] and are supportive. Patient complains difficulty sleeping throughout the night and poor appetite, upset stomach. She has been able to take care of herself including giving herself medications and functioning at her job. She plans to stay in the area, though is trying to find another place to live. States she feels safe at home.  2. diabetes. A1c stable today. Patient continues her Lantus, NovoLog, metformin. No hypoglycemic episodes. Her appetite has been poor recently. Denies polyuria, polydipsia.  Review of Systems See HPI otherwise negative.  reports that she has never smoked. She has never used smokeless tobacco.    Objective:   Physical Exam  Vitals reviewed. Constitutional: She is oriented to person, place, and time. She appears well-developed and well-nourished. No distress.       Tearful.  HENT:  Head: Normocephalic and atraumatic.  Nose: Nose normal.  Mouth/Throat: Oropharynx is clear and moist. No oropharyngeal exudate.  Eyes: EOM are normal. Pupils are equal, round, and reactive to light.  Neck: Neck supple.  Cardiovascular: Normal rate, regular rhythm and normal heart sounds.   No murmur heard. Pulmonary/Chest: Effort normal and breath sounds normal. No respiratory distress. She has no wheezes.  Abdominal: Soft. Bowel sounds are normal. She exhibits no distension. There is no tenderness. There is no rebound and no guarding.  Musculoskeletal: She exhibits no edema and no tenderness.  Neurological: She is alert and  oriented to person, place, and time. She exhibits normal muscle tone. Coordination normal.  Skin: No rash noted.  Psychiatric: Her behavior is normal. Thought content normal.       Sign language interpreter present, thoughts, dress, communication are appropriate.          Assessment & Plan:

## 2012-01-26 NOTE — Patient Instructions (Addendum)
I am sorry for your grief over loss of marriage. Is normal to have physical response to grief. Make sure to keep hydrated in this hot weather. Do your best to keep active with exercise as a good stress relief. Things will get better. If you have any needs, or would like to discuss things please do not hesitate to call.  Your diabetes is under good control. Will recheck your cholesterol next visit, do not eat next visit.

## 2012-01-26 NOTE — Assessment & Plan Note (Signed)
Continue Crestor. Uncertain if leg cramps are related. Followup fasting lipids in 3 months.

## 2012-01-26 NOTE — Assessment & Plan Note (Addendum)
Patient very emotional and tearful, manifestations include poor sleep and appetite. She is coping and continuing with her daily job, routines. She does not endorse any SI today. Discussed the normalcy of grief reaction for such a long marriage. Offered counseling services, patient does not express interest today. Advised to call for any resources if desired. Followup as needed. If symptoms persist, would consider addition of Cymbalta or SNRI in setting of chronic pain conditions.

## 2012-01-26 NOTE — Assessment & Plan Note (Signed)
A1c at goal. Continue Lantus, NovoLog, metformin. Discussed with patient importance of taking care of herself including regular meals and hydration. No changes today. Followup in 3 months for lipid, foot exam.

## 2012-01-29 ENCOUNTER — Ambulatory Visit (INDEPENDENT_AMBULATORY_CARE_PROVIDER_SITE_OTHER): Payer: No Typology Code available for payment source | Admitting: Pharmacist

## 2012-01-29 ENCOUNTER — Other Ambulatory Visit: Payer: No Typology Code available for payment source

## 2012-01-29 ENCOUNTER — Encounter: Payer: Self-pay | Admitting: Pharmacist

## 2012-01-29 VITALS — BP 106/64 | HR 79 | Ht 63.5 in | Wt 135.8 lb

## 2012-01-29 DIAGNOSIS — I10 Essential (primary) hypertension: Secondary | ICD-10-CM

## 2012-01-29 DIAGNOSIS — E118 Type 2 diabetes mellitus with unspecified complications: Secondary | ICD-10-CM

## 2012-01-29 DIAGNOSIS — E1165 Type 2 diabetes mellitus with hyperglycemia: Secondary | ICD-10-CM

## 2012-01-29 NOTE — Progress Notes (Signed)
  Subjective:    Patient ID: Alicia [name], female    DOB: December 25, 1955, 56 y.o.   MRN: [id number]  HPIReviewed and agree with Dr. Macky Lower management    Review of Systems     Objective:   Physical Exam        Assessment & Plan:

## 2012-01-29 NOTE — Assessment & Plan Note (Signed)
  Diabetes of many yrs duration currently under slightly worse control of blood glucose based on recent home readings despite recent  Lab Results  Component Value Date   HGBA1C 7.6 01/26/2012  Control is suboptimal due to stress of living situation follow separation from her husband.   Denies hypoglycemic events.  Able to communicate appropriate hypoglycemia management plan. Continued basal insulin Lantus (insulin glargine).  Patient was  Instructed to increase the dose of Novolog 1-2 units from normal sliding scale IF she has readings consistently >200.   Written patient instructions provided.  Follow up in  Pharmacist Clinic Visit PRN.   Total time in face to face counseling 35 minutes.  Patient seen with sign language interpreter.

## 2012-01-29 NOTE — Progress Notes (Signed)
  Subjective:    Patient ID: Alicia [name], female    DOB: 06/09/1956, 56 y.o.   MRN: [id number]  HPI  Patient arrives accompanied by sign language interpreter.  She appears to have lost weight and is less happy than usual.   When asked about her weight she states she has been eating less since her recent separation from her husband of 31 years.  She has been living with a variety of family, friends AND family of her ex-husband.   She reports adherence with all of her medications.  She denies low blood sugar readings.  She reports readings that have been higher than usual with readings >200 consistently.   We discussed how this may be related to the current stress of her separation from her husband.   She also reports feeling dizzy, wobbly and reports stumbling on multiple occasions with some members of her family suggesting she may need a cane.     Review of Systems     Objective:   Physical Exam BP 106/64  Pulse 79  Ht 5' 3.5" (1.613 m)  Wt 135 lb 12.8 oz (61.598 kg)  BMI 23.68 kg/m2        Assessment & Plan:   Diabetes of many yrs duration currently under slightly worse control of blood glucose based on recent home readings despite recent  Lab Results  Component Value Date   HGBA1C 7.6 01/26/2012  Control is suboptimal due to stress of living situation follow separation from her husband.   Denies hypoglycemic events.  Able to communicate appropriate hypoglycemia management plan. Continued basal insulin Lantus (insulin glargine).  Patient was  Instructed to increase the dose of Novolog 1-2 units from normal sliding scale IF she has readings consistently >200.   Written patient instructions provided.  Follow up in  Pharmacist Clinic Visit PRN.   Total time in face to face counseling 35 minutes.  Patient seen with sign language interpreter.   Blood pressure appears to be low at visit today and has been consistently at goal during the last several months.  Weight loss may be cause  of low BP and symptoms of dizziness, wobbly gait and stumbling.  Decreased toprol XL from 100mg  to 50mg  daily by taking 1/2 tablet daily.   Patient communicated understanding of this change.  She was instructed to return to our office for follow up BP check and further medication reduction if symptoms of dizziness, wobbly gait or stumbling continue.  I anticipate that she may regain a few pounds as her living situation becomes stable.

## 2012-01-29 NOTE — Assessment & Plan Note (Signed)
Blood pressure appears to be low at visit today and has been consistently at goal during the last several months.  Weight loss may be cause of low BP and symptoms of dizziness, wobbly gait and stumbling.  Decreased toprol XL from 100mg  to 50mg  daily by taking 1/2 tablet daily.   Patient communicated understanding of this change.  She was instructed to return to our office for follow up BP check and further medication reduction if symptoms of dizziness, wobbly gait or stumbling continue.  I anticipate that she may regain a few pounds as her living situation becomes stable.  She does NOT like to be #135

## 2012-01-29 NOTE — Patient Instructions (Addendum)
Increase your Novolog when you are >200 consider taking 1-2 extra units per dose until your living arrangements are more consistent.  Take 1/2 of your Toprol XL 100mg  tablet daily (New dose is 50mg ) If you still feel wobbly or stumbling please set up another visit for [name] to recheck your blood pressure AND prevent you from falling. Please set this up with the Pharmacist.  If you feel OK...after taking Toprol 1/2 tablet then please keep visit with Dr. Cristal Ford in 2 months.

## 2012-02-16 ENCOUNTER — Ambulatory Visit (INDEPENDENT_AMBULATORY_CARE_PROVIDER_SITE_OTHER): Payer: No Typology Code available for payment source | Admitting: Ophthalmology

## 2012-02-16 DIAGNOSIS — E1165 Type 2 diabetes mellitus with hyperglycemia: Secondary | ICD-10-CM

## 2012-02-16 DIAGNOSIS — E11359 Type 2 diabetes mellitus with proliferative diabetic retinopathy without macular edema: Secondary | ICD-10-CM

## 2012-02-16 DIAGNOSIS — H43819 Vitreous degeneration, unspecified eye: Secondary | ICD-10-CM

## 2012-02-16 DIAGNOSIS — H2510 Age-related nuclear cataract, unspecified eye: Secondary | ICD-10-CM

## 2012-02-16 DIAGNOSIS — E1139 Type 2 diabetes mellitus with other diabetic ophthalmic complication: Secondary | ICD-10-CM

## 2012-02-16 DIAGNOSIS — I10 Essential (primary) hypertension: Secondary | ICD-10-CM

## 2012-02-16 DIAGNOSIS — H35039 Hypertensive retinopathy, unspecified eye: Secondary | ICD-10-CM

## 2012-03-14 ENCOUNTER — Encounter (INDEPENDENT_AMBULATORY_CARE_PROVIDER_SITE_OTHER): Payer: No Typology Code available for payment source | Admitting: Ophthalmology

## 2012-03-15 ENCOUNTER — Ambulatory Visit: Payer: No Typology Code available for payment source | Admitting: Family Medicine

## 2012-03-29 ENCOUNTER — Ambulatory Visit: Payer: No Typology Code available for payment source | Admitting: Family Medicine

## 2012-03-29 ENCOUNTER — Encounter: Payer: Self-pay | Admitting: Family Medicine

## 2012-03-29 ENCOUNTER — Ambulatory Visit (INDEPENDENT_AMBULATORY_CARE_PROVIDER_SITE_OTHER): Payer: No Typology Code available for payment source | Admitting: Family Medicine

## 2012-03-29 VITALS — BP 117/65 | HR 68 | Temp 98.0°F | Wt 133.0 lb

## 2012-03-29 DIAGNOSIS — Z23 Encounter for immunization: Secondary | ICD-10-CM

## 2012-03-29 DIAGNOSIS — R5383 Other fatigue: Secondary | ICD-10-CM

## 2012-03-29 DIAGNOSIS — R5381 Other malaise: Secondary | ICD-10-CM

## 2012-03-29 LAB — CBC WITH DIFFERENTIAL/PLATELET
Basophils Absolute: 0 10*3/uL (ref 0.0–0.1)
Basophils Relative: 0 % (ref 0–1)
Eosinophils Absolute: 0.2 10*3/uL (ref 0.0–0.7)
Eosinophils Relative: 3 % (ref 0–5)
HCT: 34.5 % — ABNORMAL LOW (ref 36.0–46.0)
Hemoglobin: 11.8 g/dL — ABNORMAL LOW (ref 12.0–15.0)
Lymphocytes Relative: 35 % (ref 12–46)
Lymphs Abs: 2.1 10*3/uL (ref 0.7–4.0)
MCH: 29.6 pg (ref 26.0–34.0)
MCHC: 34.2 g/dL (ref 30.0–36.0)
MCV: 86.7 fL (ref 78.0–100.0)
Monocytes Absolute: 0.4 10*3/uL (ref 0.1–1.0)
Monocytes Relative: 7 % (ref 3–12)
Neutro Abs: 3.3 10*3/uL (ref 1.7–7.7)
Neutrophils Relative %: 55 % (ref 43–77)
Platelets: 340 10*3/uL (ref 150–400)
RBC: 3.98 MIL/uL (ref 3.87–5.11)
RDW: 13.8 % (ref 11.5–15.5)
WBC: 6 10*3/uL (ref 4.0–10.5)

## 2012-03-29 LAB — COMPREHENSIVE METABOLIC PANEL
ALT: 9 U/L (ref 0–35)
AST: 12 U/L (ref 0–37)
Albumin: 4.4 g/dL (ref 3.5–5.2)
Alkaline Phosphatase: 60 U/L (ref 39–117)
BUN: 20 mg/dL (ref 6–23)
CO2: 27 mEq/L (ref 19–32)
Calcium: 10.5 mg/dL (ref 8.4–10.5)
Chloride: 100 mEq/L (ref 96–112)
Creat: 0.59 mg/dL (ref 0.50–1.10)
Glucose, Bld: 79 mg/dL (ref 70–99)
Potassium: 3.7 mEq/L (ref 3.5–5.3)
Sodium: 139 mEq/L (ref 135–145)
Total Bilirubin: 0.3 mg/dL (ref 0.3–1.2)
Total Protein: 6.9 g/dL (ref 6.0–8.3)

## 2012-03-29 LAB — POCT SEDIMENTATION RATE: POCT SED RATE: 17 mm/hr (ref 0–22)

## 2012-03-29 LAB — CK: Total CK: 72 U/L (ref 7–177)

## 2012-03-29 LAB — TSH: TSH: 0.974 u[IU]/mL (ref 0.350–4.500)

## 2012-03-29 LAB — FERRITIN: Ferritin: 25 ng/mL (ref 10–291)

## 2012-03-29 NOTE — Patient Instructions (Addendum)
Nice to see you. Your symptoms all together starting after July are most likely related to changes and depression, poor sleep. These might fade with time as you are restarting your routine. Exercise can help these issues. We can discuss medications again if you would like. Make appointment in 1-2 months.  Call sooner if you have worsened symptoms weight loss, pain, fevers or other concerns.  Insomnia Insomnia is frequent trouble falling and/or staying asleep. Insomnia can be a long term problem or a short term problem. Both are common. Insomnia can be a short term problem when the wakefulness is related to a certain stress or worry. Long term insomnia is often related to ongoing stress during waking hours and/or poor sleeping habits. Overtime, sleep deprivation itself can make the problem worse. Every little thing feels more severe because you are overtired and your ability to cope is decreased. CAUSES   Stress, anxiety, and depression.   Poor sleeping habits.   Distractions such as TV in the bedroom.   Naps close to bedtime.   Engaging in emotionally charged conversations before bed.   Technical reading before sleep.   Alcohol and other sedatives. They may make the problem worse. They can hurt normal sleep patterns and normal dream activity.   Stimulants such as caffeine for several hours prior to bedtime.   Pain syndromes and shortness of breath can cause insomnia.   Exercise late at night.   Changing time zones may cause sleeping problems (jet lag).  It is sometimes helpful to have someone observe your sleeping patterns. They should look for periods of not breathing during the night (sleep apnea). They should also look to see how long those periods last. If you live alone or observers are uncertain, you can also be observed at a sleep clinic where your sleep patterns will be professionally monitored. Sleep apnea requires a checkup and treatment. Give your caregivers your medical  history. Give your caregivers observations your family has made about your sleep.  SYMPTOMS   Not feeling rested in the morning.   Anxiety and restlessness at bedtime.   Difficulty falling and staying asleep.  TREATMENT   Your caregiver may prescribe treatment for an underlying medical disorders. Your caregiver can give advice or help if you are using alcohol or other drugs for self-medication. Treatment of underlying problems will usually eliminate insomnia problems.   Medications can be prescribed for short time use. They are generally not recommended for lengthy use.   Over-the-counter sleep medicines are not recommended for lengthy use. They can be habit forming.   You can promote easier sleeping by making lifestyle changes such as:   Using relaxation techniques that help with breathing and reduce muscle tension.   Exercising earlier in the day.   Changing your diet and the time of your last meal. No night time snacks.   Establish a regular time to go to bed.   Counseling can help with stressful problems and worry.   Soothing music and white noise may be helpful if there are background noises you cannot remove.   Stop tedious detailed work at least one hour before bedtime.  HOME CARE INSTRUCTIONS   Keep a diary. Inform your caregiver about your progress. This includes any medication side effects. See your caregiver regularly. Take note of:   Times when you are asleep.   Times when you are awake during the night.   The quality of your sleep.   How you feel the next day.  This  information will help your caregiver care for you.  Get out of bed if you are still awake after 15 minutes. Read or do some quiet activity. Keep the lights down. Wait until you feel sleepy and go back to bed.   Keep regular sleeping and waking hours. Avoid naps.   Exercise regularly.   Avoid distractions at bedtime. Distractions include watching television or engaging in any intense or  detailed activity like attempting to balance the household checkbook.   Develop a bedtime ritual. Keep a familiar routine of bathing, brushing your teeth, climbing into bed at the same time each night, listening to soothing music. Routines increase the success of falling to sleep faster.   Use relaxation techniques. This can be using breathing and muscle tension release routines. It can also include visualizing peaceful scenes. You can also help control troubling or intruding thoughts by keeping your mind occupied with boring or repetitive thoughts like the old concept of counting sheep. You can make it more creative like imagining planting one beautiful flower after another in your backyard garden.   During your day, work to eliminate stress. When this is not possible use some of the previous suggestions to help reduce the anxiety that accompanies stressful situations.  MAKE SURE YOU:   Understand these instructions.   Will watch your condition.   Will get help right away if you are not doing well or get worse.  Document Released: 06/16/2000 Document Revised: 06/08/2011 Document Reviewed: 07/17/2007 [facility] Patient Information 2012 [place], [place].

## 2012-03-30 NOTE — Progress Notes (Signed)
  Subjective:    Patient ID: Alicia [name], female    DOB: Jan 30, 1956, 56 y.o.   MRN: [id number]  HPI  1. Fatigue. Patient complains of onset fatigue, feeling like she wants to stay in bed, daytime fatigue and difficulty sleeping. Onset was in July after she split up with her husband of 30 years. She has also noticed a change in bowel habits including intermittent loose stools, constipation and migratory abdominal cramping at times. Decreased appetite. Mind wanders at night causing difficulty with falling asleep. States she is getting back to her previous level of functioning by playing with grand-children more. Thinks her mood is slightly anxious/somewhat depressed but is a little bit better with time that has passed so far. She has stopped exercising. Her family worries she is losing weight. Labile CBGs.  Wt Readings from Last 3 Encounters:  03/29/12 133 lb (60.328 kg)  01/29/12 135 lb 12.8 oz (61.598 kg)  01/26/12 135 lb 12.8 oz (61.598 kg)   Denies any alcohol/drug use, snoring, fevers, chills, cough, blood in stool, emesis, headache, myalgias, rash. Has a remote hx of depression many years ago and took a medication prior to remission.  Review of Systems See HPI otherwise negative.     Objective:   Physical Exam  Vitals reviewed. Constitutional: She is oriented to person, place, and time. She appears well-developed and well-nourished. No distress.  HENT:  Head: Normocephalic and atraumatic.  Mouth/Throat: Oropharynx is clear and moist. No oropharyngeal exudate.  Eyes: EOM are normal. Pupils are equal, round, and reactive to light.  Neck: Neck supple. No tracheal deviation present. No thyromegaly present.  Cardiovascular: Normal rate, regular rhythm and normal heart sounds.   No murmur heard. Pulmonary/Chest: Effort normal and breath sounds normal. No respiratory distress. She has no wheezes. She has no rales.  Abdominal: Soft. Bowel sounds are normal. She exhibits no distension.  There is no tenderness. There is no rebound.  Musculoskeletal: She exhibits no edema and no tenderness.  Lymphadenopathy:    She has no cervical adenopathy.  Neurological: She is alert and oriented to person, place, and time. No cranial nerve deficit. Coordination normal.  Skin: No rash noted. She is not diaphoretic.  Psychiatric: Her behavior is normal. Thought content normal.       Patient smiles. Animated sign language per usual (interpretor present).  No slowness of movement.    PHQ-9 score is 14, moderately difficult.      Assessment & Plan:

## 2012-03-30 NOTE — Assessment & Plan Note (Signed)
Most likely related to depression from recent separation from husband >30 yrs. PHQ-9 score is positive. Perhaps a component of IBS is present also. No red flags to suggest malignancy, inflammatory or endocrine condition. Will check TSH, ESR, CBC, electrolytes to evaluate for these more serious causes. Discussed with patient most likely etiology of recurrent depression and guidance regarding restarting exercise, forcing herself out of bed to have fun with grand-kids, eating well. She refuses a trial of medication and prefers to monitor symptoms another 1-2 months and f/u then. Might consider TCA or cymbalta to help with sleep and frequent musculoskeletal complaints. Advised to f/u sooner if GI symptoms or pain worsens, weight loss or other worrisome symptoms.

## 2012-04-04 ENCOUNTER — Encounter: Payer: Self-pay | Admitting: Pharmacist

## 2012-04-04 ENCOUNTER — Ambulatory Visit (INDEPENDENT_AMBULATORY_CARE_PROVIDER_SITE_OTHER): Payer: No Typology Code available for payment source | Admitting: Pharmacist

## 2012-04-04 VITALS — BP 130/68 | HR 84 | Ht 63.5 in | Wt 138.0 lb

## 2012-04-04 DIAGNOSIS — E118 Type 2 diabetes mellitus with unspecified complications: Secondary | ICD-10-CM

## 2012-04-04 DIAGNOSIS — E7800 Pure hypercholesterolemia, unspecified: Secondary | ICD-10-CM

## 2012-04-04 DIAGNOSIS — IMO0002 Reserved for concepts with insufficient information to code with codable children: Secondary | ICD-10-CM

## 2012-04-04 DIAGNOSIS — E1165 Type 2 diabetes mellitus with hyperglycemia: Secondary | ICD-10-CM

## 2012-04-04 NOTE — Progress Notes (Signed)
  Subjective:    Patient ID: Alicia [name], female    DOB: May 17, 1956, 56 y.o.   MRN: [id number]  HPI Patient arrives well-appearing with sign language interpreter [name] for blood sugar reassessment. She provides her CBG's log and home medications. She endorses stress from her recent separation with husband and has not been able to maintain a regular meal schedule.     Review of Systems     Objective:   Physical Exam        Assessment & Plan:  Her CBG control appears erratic as evidenced by several CBG's in 70's and 200's. We discussed the importance of maintaining a regular meal schedule while on insulin and instructed her to avoid skipping meals. Her A1c appears stable. BP appears well-controlled on current antihypertensive regimen. She does not complain of any issues with medications. Plan for lipid panel tests during her next visit and appears to tolerate rosuvastatin. Follow up with Dr. Cristal Ford in two months.

## 2012-04-04 NOTE — Patient Instructions (Addendum)
Thank you for coming in today. Please continue with your medications as usual and follow up with Dr. Cristal Ford in two months.

## 2012-04-04 NOTE — Assessment & Plan Note (Signed)
Plan for lipid panel tests during her next visit and appears to tolerate rosuvastatin. Follow up with Dr. Cristal Ford in two months.

## 2012-04-04 NOTE — Assessment & Plan Note (Signed)
Her CBG control appears erratic as evidenced by several CBG's in 70's and 200's. We discussed the importance of maintaining a regular meal schedule while on insulin and instructed her to avoid skipping meals. Her A1c appears stable. BP appears well-controlled on current antihypertensive regimen. She does not complain of any issues with medications. Plan for lipid panel tests during her next visit and appears to tolerate rosuvastatin. Follow up with Dr. Cristal Ford in two months.

## 2012-04-04 NOTE — Progress Notes (Signed)
Patient ID: Alicia [name], female   DOB: 1955/07/16, 56 y.o.   MRN: [id number] Reviewed and agree with Dr. Macky Lower documentation and management.

## 2012-04-09 ENCOUNTER — Telehealth: Payer: Self-pay | Admitting: *Deleted

## 2012-04-09 NOTE — Telephone Encounter (Signed)
Message copied by Deno Etienne on Tue Apr 09, 2012  2:55 PM ------      Message from: Durwin Reges      Created: Mon Apr 08, 2012  1:46 PM      Regarding: mild anemia       Please inform her labs all normal, except she has very mild anemia. This does not explain her symptoms. We need to do stool cards, she can pick these up anytime. We can talk about results at next visit in one month.

## 2012-04-09 NOTE — Telephone Encounter (Signed)
Called pt and had the interpreter leave a video message informing her that her results were normal however, she has very mild anemia. Dr. Cristal Ford would like for her to come by to p/u stool cards and to make a follow up appt to discuss her lab results in 1 month. Pt can call back to schedule this appt at her convenience.Loralee Pacas Aldan

## 2012-04-28 ENCOUNTER — Other Ambulatory Visit: Payer: Self-pay | Admitting: Family Medicine

## 2012-05-02 ENCOUNTER — Other Ambulatory Visit: Payer: Self-pay | Admitting: Family Medicine

## 2012-05-05 ENCOUNTER — Other Ambulatory Visit: Payer: Self-pay | Admitting: Family Medicine

## 2012-05-10 ENCOUNTER — Other Ambulatory Visit: Payer: Self-pay | Admitting: Family Medicine

## 2012-05-17 ENCOUNTER — Encounter: Payer: Self-pay | Admitting: Family Medicine

## 2012-05-17 ENCOUNTER — Ambulatory Visit (INDEPENDENT_AMBULATORY_CARE_PROVIDER_SITE_OTHER): Payer: No Typology Code available for payment source | Admitting: Family Medicine

## 2012-05-17 VITALS — BP 134/80 | HR 76 | Ht 63.5 in | Wt 138.0 lb

## 2012-05-17 DIAGNOSIS — M19049 Primary osteoarthritis, unspecified hand: Secondary | ICD-10-CM

## 2012-05-17 DIAGNOSIS — M189 Osteoarthritis of first carpometacarpal joint, unspecified: Secondary | ICD-10-CM

## 2012-05-17 NOTE — Assessment & Plan Note (Addendum)
Last corticosteroid collection was done in July so it's been 4 months. She had excellent relief from that for at least 3 or 3-1/2 of those 4 months. Today we gave her repeat corticosteroid injection. She can followup when necessary.

## 2012-05-17 NOTE — Progress Notes (Signed)
  Subjective:    Patient ID: Alicia [name], female    DOB: 09-21-1955, 56 y.o.   MRN: [id number]  HPI Rate current of right thumb pain. Last corticosteroid injection in July that worked quite well for at least 3 months or maybe a little longer. Over the last 2-3 weeks she's had increasing pain. Inhibits her ability to do her job. She has otherwise felt well.   Review of Systems Noted no new swelling, erythema or warmth of the right thumb. Denies fevers.    Objective:   Physical Exam Vital signs are reviewed She is here with interpreter for the deaf. GENERAL: Well-developed female no acute distress Thumb: Right. Tender to palpation at the [facility] joint with some pain with active opposition as well as abduction. The joint is not red or warm. There is no deformity of the joint noted.  INJECTION: Patient was given informed consent, signed copy in the chart. Appropriate time out was taken. Area prepped and draped in usual sterile fashion. One half cc of methylprednisolone 40 mg/ml plus  One half cc of 1% lidocaine without epinephrine was injected into the Right [facility] joint using a(n) Perpendicular approach. The patient tolerated the procedure well. There were no complications. Post procedure instructions were given.        Assessment & Plan:

## 2012-05-23 ENCOUNTER — Other Ambulatory Visit: Payer: Self-pay | Admitting: Family Medicine

## 2012-05-26 ENCOUNTER — Other Ambulatory Visit: Payer: Self-pay | Admitting: Family Medicine

## 2012-05-27 ENCOUNTER — Other Ambulatory Visit: Payer: Self-pay | Admitting: Family Medicine

## 2012-05-27 MED ORDER — METOPROLOL SUCCINATE ER 50 MG PO TB24: 50.0000 mg | ORAL_TABLET | Freq: Every day | ORAL | Status: DC

## 2012-06-05 ENCOUNTER — Ambulatory Visit: Payer: No Typology Code available for payment source | Admitting: Family Medicine

## 2012-06-14 ENCOUNTER — Ambulatory Visit: Payer: No Typology Code available for payment source | Admitting: Family Medicine

## 2012-07-01 ENCOUNTER — Ambulatory Visit (INDEPENDENT_AMBULATORY_CARE_PROVIDER_SITE_OTHER): Payer: No Typology Code available for payment source | Admitting: Family Medicine

## 2012-07-01 ENCOUNTER — Encounter: Payer: Self-pay | Admitting: *Deleted

## 2012-07-01 VITALS — BP 122/60 | HR 72 | Temp 97.6°F | Ht 63.5 in | Wt 141.0 lb

## 2012-07-01 DIAGNOSIS — E1165 Type 2 diabetes mellitus with hyperglycemia: Secondary | ICD-10-CM

## 2012-07-01 DIAGNOSIS — D509 Iron deficiency anemia, unspecified: Secondary | ICD-10-CM | POA: Insufficient documentation

## 2012-07-01 DIAGNOSIS — F4321 Adjustment disorder with depressed mood: Secondary | ICD-10-CM

## 2012-07-01 DIAGNOSIS — D649 Anemia, unspecified: Secondary | ICD-10-CM

## 2012-07-01 DIAGNOSIS — E1149 Type 2 diabetes mellitus with other diabetic neurological complication: Secondary | ICD-10-CM

## 2012-07-01 DIAGNOSIS — E118 Type 2 diabetes mellitus with unspecified complications: Secondary | ICD-10-CM

## 2012-07-01 LAB — CBC WITH DIFFERENTIAL/PLATELET
Basophils Absolute: 0 10*3/uL (ref 0.0–0.1)
Basophils Relative: 0 % (ref 0–1)
Eosinophils Absolute: 0.1 10*3/uL (ref 0.0–0.7)
Eosinophils Relative: 2 % (ref 0–5)
HCT: 36.3 % (ref 36.0–46.0)
Hemoglobin: 12.2 g/dL (ref 12.0–15.0)
Lymphocytes Relative: 25 % (ref 12–46)
Lymphs Abs: 1.5 10*3/uL (ref 0.7–4.0)
MCH: 29.6 pg (ref 26.0–34.0)
MCHC: 33.6 g/dL (ref 30.0–36.0)
MCV: 88.1 fL (ref 78.0–100.0)
Monocytes Absolute: 0.3 10*3/uL (ref 0.1–1.0)
Monocytes Relative: 6 % (ref 3–12)
Neutro Abs: 4.1 10*3/uL (ref 1.7–7.7)
Neutrophils Relative %: 67 % (ref 43–77)
Platelets: 348 10*3/uL (ref 150–400)
RBC: 4.12 MIL/uL (ref 3.87–5.11)
RDW: 14.3 % (ref 11.5–15.5)
WBC: 6 10*3/uL (ref 4.0–10.5)

## 2012-07-01 LAB — VITAMIN B12: Vitamin B-12: 449 pg/mL (ref 211–911)

## 2012-07-01 NOTE — Patient Instructions (Signed)
Neurontin is a good medicine for nerve pain in your feet. Your abdominal pain with bending does not sound concerning for anything serious. Please return stool cards because you have a very mild anemia. We can recheck this level at next visit in 2-3 months.

## 2012-07-01 NOTE — Assessment & Plan Note (Signed)
Fatigue is resolved, she is no longer concerned with these issues. F/u prn.

## 2012-07-01 NOTE — Assessment & Plan Note (Signed)
No recent lows. F/u in 1 month with Dr. Raymondo Band. F/u in 2-3 months with PCP. Foot exam normal today, going for eye check in Feb.

## 2012-07-01 NOTE — Progress Notes (Signed)
  Subjective:    Patient ID: Alicia [name], female    DOB: 1955-10-16, 56 y.o.   MRN: [id number]  HPI  1. Anemia. Very mild noted on labs in September, has not seen any blood in stool or urine or vaginal bleeding. Her energy level is improved since last visit when we discussed her recent divorce. She is eating well. Denies abdominal pain, diarrhea, fatigue, palpitations. Had neg colonoscopy in 2009.  2. Pain in ribcage with bending. Notices a pain in epigastric area only when she bends over sometimes. Not everytime. No dyspnea, cough, chest pain, abd pain, severe reflux.  3. Fatigue. Thinks this is much improved with time passing since her divorce. She is now playing with her grandkids again and feels well. Weight is stable, even up 3 lbs since christmas.   Review of Systems See HPI otherwise negative.  reports that she has never smoked. She has never used smokeless tobacco.     Objective:   Physical Exam  Vitals reviewed. Constitutional: She is oriented to person, place, and time. She appears well-developed and well-nourished. No distress.  HENT:  Head: Normocephalic and atraumatic.  Mouth/Throat: Oropharynx is clear and moist.  Eyes: EOM are normal.  Neck: Neck supple. No thyromegaly present.  Cardiovascular: Normal rate, regular rhythm and normal heart sounds.   No murmur heard. Pulmonary/Chest: Effort normal and breath sounds normal. No respiratory distress. She has no wheezes. She has no rales.  Abdominal: Soft. She exhibits no distension. There is no tenderness.  Musculoskeletal: She exhibits no edema.  Neurological: She is alert and oriented to person, place, and time.       Monofilament sensation intact B feet.  Skin: No rash noted. She is not diaphoretic.  Psychiatric: She has a normal mood and affect.          Assessment & Plan:

## 2012-07-01 NOTE — Assessment & Plan Note (Signed)
Very slight hgb 11.8. Will check B12 level on metformin long term. Stool cards, recommended she return these though low likelihood to be significant with colonoscopy 4 yrs ago.

## 2012-07-01 NOTE — Assessment & Plan Note (Signed)
Controlled with neurontin 

## 2012-07-04 ENCOUNTER — Encounter: Payer: Self-pay | Admitting: Family Medicine

## 2012-07-26 ENCOUNTER — Ambulatory Visit: Payer: No Typology Code available for payment source | Admitting: Pharmacist

## 2012-08-05 ENCOUNTER — Ambulatory Visit (INDEPENDENT_AMBULATORY_CARE_PROVIDER_SITE_OTHER): Payer: No Typology Code available for payment source | Admitting: Pharmacist

## 2012-08-05 ENCOUNTER — Encounter: Payer: Self-pay | Admitting: Pharmacist

## 2012-08-05 VITALS — BP 118/73 | HR 84 | Ht 63.0 in | Wt 144.0 lb

## 2012-08-05 DIAGNOSIS — E118 Type 2 diabetes mellitus with unspecified complications: Secondary | ICD-10-CM

## 2012-08-05 DIAGNOSIS — E1165 Type 2 diabetes mellitus with hyperglycemia: Secondary | ICD-10-CM

## 2012-08-05 MED ORDER — INSULIN ASPART 100 UNIT/ML ~~LOC~~ SOLN: SUBCUTANEOUS | Status: DC

## 2012-08-05 NOTE — Assessment & Plan Note (Signed)
Diabetes of many yrs duration currently under excellent control of blood glucose based on   Lab Results  Component Value Date   HGBA1C 7.6 01/26/2012    ,home fasting CBG readings consistently 90-200 with occasional readings higher and lower. Control is suboptimal due to busy lifestyle and times of erratic meal intake. She denies missing meals and is comfortable with rare hypoglycemic events.  Able to communicate appropriate hypoglycemia management plan. Continued basal insulin Lantus (insulin glargine) at 6 units daily. Continued rapid insulin Novolog (insulin aspart) with sliding scale dose.  Written patient instructions provided.  Follow up in  Pharmacist Clinic Visit after next visit with Dr. Cristal Ford when she can get her next A1C.  I am happy to see her during the transition period to her new provider mid-year.    Total time in face to face counseling 35 minutes.  Patient seen with Interpreter service - Cathey.

## 2012-08-05 NOTE — Progress Notes (Signed)
  Subjective:    Patient ID: Alicia [name], female    DOB: 1955/08/05, 57 y.o.   MRN: [id number]  HPI Patient arrives accompanied by interpreter. She reports doing well.   Reports minimal low blood sugars.   She does report a few episodes of feeling dizzy or having a blank stare.   Others have had her sit down and her symptoms resolve.  She reports she has NOT had any of these episodes lately.   She does NOT think they are related to low blood sugar readings.   Overall, her activity and food intake schedule are consistent.  She believes her blood sugar readings, although erratic, are consistent with past readings.   She was interested in stool card results.      Review of Systems     Objective:   Physical Exam  CBG log ranges 45-421 with majority of readings 90-200.        Assessment & Plan:   Diabetes of many yrs duration currently under excellent control of blood glucose based on   Lab Results  Component Value Date   HGBA1C 7.6 01/26/2012    ,home fasting CBG readings consistently 90-200 with occasional readings higher and lower. Control is suboptimal due to busy lifestyle and times of erratic meal intake. She denies missing meals and is comfortable with rare hypoglycemic events.  Able to communicate appropriate hypoglycemia management plan. Continued basal insulin Lantus (insulin glargine) at 6 units daily. Continued rapid insulin Novolog (insulin aspart) with sliding scale dose.  Written patient instructions provided.  Follow up in  Pharmacist Clinic Visit after next visit with Dr. Cristal Ford when she can get her next A1C.  I am happy to see her during the transition period to her new provider mid-year.    Total time in face to face counseling 35 minutes.  Patient seen with Interpreter service - Cathey.   Patient reissued stool cards as previous stool cards do NOT appear to have returned to the office lab.  No results in computer or record of being recvd in lab.

## 2012-08-05 NOTE — Patient Instructions (Addendum)
Thanks for coming in today!

## 2012-08-06 ENCOUNTER — Other Ambulatory Visit: Payer: No Typology Code available for payment source

## 2012-08-06 LAB — HEMOCCULT SLIDES (X 3 CARDS)
Fecal Occult Blood: NEGATIVE
OCCULT 1: NEGATIVE
OCCULT 2: NEGATIVE
OCCULT 3: NEGATIVE
OCCULT 4: NEGATIVE
OCCULT 5: NEGATIVE

## 2012-08-06 NOTE — Progress Notes (Signed)
Patient ID: Alicia [name], female   DOB: [birth year], 57 y.o.   MRN: [id number] Reviewed: Agree with this management and documentation.

## 2012-08-07 ENCOUNTER — Encounter: Payer: Self-pay | Admitting: Family Medicine

## 2012-08-12 ENCOUNTER — Other Ambulatory Visit: Payer: Self-pay | Admitting: Family Medicine

## 2012-08-19 ENCOUNTER — Ambulatory Visit (INDEPENDENT_AMBULATORY_CARE_PROVIDER_SITE_OTHER): Payer: No Typology Code available for payment source | Admitting: Ophthalmology

## 2012-08-19 DIAGNOSIS — H43819 Vitreous degeneration, unspecified eye: Secondary | ICD-10-CM

## 2012-08-19 DIAGNOSIS — H2510 Age-related nuclear cataract, unspecified eye: Secondary | ICD-10-CM

## 2012-08-19 DIAGNOSIS — H3581 Retinal edema: Secondary | ICD-10-CM

## 2012-08-19 DIAGNOSIS — I10 Essential (primary) hypertension: Secondary | ICD-10-CM

## 2012-08-19 DIAGNOSIS — E1139 Type 2 diabetes mellitus with other diabetic ophthalmic complication: Secondary | ICD-10-CM

## 2012-08-19 DIAGNOSIS — E11359 Type 2 diabetes mellitus with proliferative diabetic retinopathy without macular edema: Secondary | ICD-10-CM

## 2012-08-19 DIAGNOSIS — H35039 Hypertensive retinopathy, unspecified eye: Secondary | ICD-10-CM

## 2012-08-23 ENCOUNTER — Ambulatory Visit (INDEPENDENT_AMBULATORY_CARE_PROVIDER_SITE_OTHER): Payer: No Typology Code available for payment source | Admitting: Family Medicine

## 2012-08-23 ENCOUNTER — Encounter: Payer: Self-pay | Admitting: Family Medicine

## 2012-08-23 VITALS — BP 125/79 | HR 75 | Temp 97.7°F | Ht 63.0 in | Wt 143.0 lb

## 2012-08-23 DIAGNOSIS — Z23 Encounter for immunization: Secondary | ICD-10-CM

## 2012-08-23 DIAGNOSIS — M25532 Pain in left wrist: Secondary | ICD-10-CM

## 2012-08-23 DIAGNOSIS — D649 Anemia, unspecified: Secondary | ICD-10-CM

## 2012-08-23 DIAGNOSIS — E119 Type 2 diabetes mellitus without complications: Secondary | ICD-10-CM

## 2012-08-23 DIAGNOSIS — E1165 Type 2 diabetes mellitus with hyperglycemia: Secondary | ICD-10-CM

## 2012-08-23 DIAGNOSIS — M25539 Pain in unspecified wrist: Secondary | ICD-10-CM

## 2012-08-23 LAB — POCT GLYCOSYLATED HEMOGLOBIN (HGB A1C): Hemoglobin A1C: 7.5

## 2012-08-23 MED ORDER — GABAPENTIN 300 MG PO CAPS: 300.0000 mg | ORAL_CAPSULE | Freq: Four times a day (QID) | ORAL | Status: DC

## 2012-08-23 NOTE — Progress Notes (Signed)
  Subjective:    Patient ID: Alicia [name], female    DOB: 07/08/1955, 57 y.o.   MRN: [id number]  HPI  1. DM2. A1c is stable 7.5 today. No recent hypoglycemic episodes or problems with meds. She is trying to exercise regularly. Had 3 areas lasered for retinopathy this week.   2. Left wrist pain. Similar to previous in 2012 after she injured playing with her grandkids and a ball. Has pain in a very specific area on palmar side nearest pinky finger. Previously this improved gradually with rest and conservative treatment. No swelling, redness, weakness, numbness or deformity.  Has a history of ganglion cyst removal on left, tendon release surgery on right.   3. Right thumb/hand pain. underwent [facility] joint injection three months ago and still doing okay. She complains about tingling and numbness in her right hand at times, worse at night. No weakness, dropping objects, swelling.   Review of Systems See HPI otherwise negative.  reports that she has never smoked. She has never used smokeless tobacco.     Objective:   Physical Exam  Constitutional: She is oriented to person, place, and time. She appears well-developed and well-nourished. No distress.  HENT:  Head: Normocephalic and atraumatic.  Cardiovascular: Normal rate, regular rhythm and normal heart sounds.   No murmur heard. Pulmonary/Chest: Effort normal and breath sounds normal.  Musculoskeletal:  5/5 bilateral grip strength.  TTP in left wrist palmar and ulnar side. No edema or deformity. Pain with wrist flexion, not extension. Sensation to light touch intact bilaterally.  Negative phalens/tinels  Neurological: She is alert and oriented to person, place, and time.  Skin: She is not diaphoretic.          Assessment & Plan:

## 2012-08-23 NOTE — Assessment & Plan Note (Signed)
Likely recurrence of tendonitis. Previous plain film was negative and I seen no need to order more imaging currently. Recommend short course of NSAID, soft brace, icing. F/u in 3-4 weeks with [facility] if not improving.

## 2012-08-23 NOTE — Assessment & Plan Note (Signed)
Very mild anemia with negative stool cards and no red flags, colonoscopy UTD. No significant iron deficiency on labs. Will follow up labs in 3 months next visit. Consider adding B12 in setting of long term metformin.

## 2012-08-23 NOTE — Assessment & Plan Note (Signed)
At goal currently. No recent hypoglycemia so will not be aggressive about attempting further control. She has complications such as neuropathy and retinopathy. Pneumovax today. Advised f/u with Pharm clinic in 2-3 months.

## 2012-08-23 NOTE — Patient Instructions (Addendum)
You may have some neuropathy causing pain in right hand. You can try taking two tablets at night for nerve pain. I think left wrist pain is tendonitis from injury.  You can take motrin 600 mg twice daily for next 5 days.  You can use ice three times day for 15 minutes. Get a soft wrist brace.  Make appointment with Sports medicine for wrist pain in 3-4 weeks if still having pain. Make appointment for follow up in 2-3 months.

## 2012-08-30 ENCOUNTER — Other Ambulatory Visit: Payer: Self-pay | Admitting: Family Medicine

## 2012-09-20 ENCOUNTER — Telehealth: Payer: Self-pay | Admitting: Family Medicine

## 2012-09-20 ENCOUNTER — Ambulatory Visit (INDEPENDENT_AMBULATORY_CARE_PROVIDER_SITE_OTHER): Payer: No Typology Code available for payment source | Admitting: Family Medicine

## 2012-09-20 ENCOUNTER — Encounter: Payer: Self-pay | Admitting: Family Medicine

## 2012-09-20 VITALS — BP 110/68 | HR 78 | Temp 97.8°F | Ht 63.0 in | Wt 139.0 lb

## 2012-09-20 DIAGNOSIS — K529 Noninfective gastroenteritis and colitis, unspecified: Secondary | ICD-10-CM

## 2012-09-20 DIAGNOSIS — K5289 Other specified noninfective gastroenteritis and colitis: Secondary | ICD-10-CM

## 2012-09-20 MED ORDER — ONDANSETRON HCL 4 MG PO TABS: 4.0000 mg | ORAL_TABLET | Freq: Three times a day (TID) | ORAL | Status: DC | PRN

## 2012-09-20 NOTE — Assessment & Plan Note (Signed)
A: Relatively mild symptoms that seem to be improving on their own. Definite sick contact with similar symptoms earlier this week. Blood sugars slightly elevated.  P: Supportive care. Discussed BRAT-type diet and good fluid intake. Rx for Zofran. Red flags reviewed. Return to clinic PRN.

## 2012-09-20 NOTE — Telephone Encounter (Signed)
Patient is requesting to speak to the triage nurse about getting sick yesterday with vomiting and diarrhea and she threw up all of her meds so her bp is elevated and she feels very weak.

## 2012-09-20 NOTE — Patient Instructions (Addendum)
Thank you for coming in, today. It was nice to meet you. I'm sorry you're not feeling well. I will write you a prescription for a medication that will help with the nausea/vomiting.    It is called Zofran. You can take 4 mg every 8 hours.    Drink a small amount of water with the medication.    Small amounts of food and water at a time will be easier to eat than large meals. You can take Mucinex (guaifenesin) over the counter for congestion. You can take Tylenol for pain/fevers. Bland foods will help settle your stomach. Things like:    Rice, crackers, bread, toast    Thin, clear soups    Bananas Check your sugar regularly. Your blood sugar may be higher than usual while you are sick. If you have any of the following symptoms, call the clinic back or go to the emergency room:    High fevers over 101    Worse vomiting/diarrhea, especially if you are putting out more than you can take in    Difficulty breathing or chest pain Please call at any time with questions or concerns. --Dr. Casper Harrison

## 2012-09-20 NOTE — Telephone Encounter (Signed)
Spoke to patient through video relay for hearing impaired, States she vomited yesterday , last time being in the afternoon. Diarrhea continues today x 2. Felt feverish yesterday. Still nauseated , not able to drink well yet. Advised to start sipping slowly then gradually increase liquids. States she ate couple of crackers this AM. Offered appointment and will come today at 1:30.

## 2012-09-20 NOTE — Progress Notes (Signed)
  Subjective:    Patient ID: Alicia [name], female    DOB: 04/17/1956, 57 y.o.   MRN: [id number]  HPI: Pt presents to clinic for a SDA for 2 days of nausea/vomiting, and diarrhea. Pt is deaf; in-person interpretor assisted with interview and counseling Clint Guy, RN). Pt reports yesterday she woke up feeling bloated and dizzy and induced vomiting once, then vomited twice more after taking her morning medications and after lunch. Pt also had some subjective fever yesterday. Pt awoke with diarrhea this morning and has had no more vomiting, but has only been able to eat a few saltine crackers. She was able to take all her regular medications this morning but did not take her usual vitamins. Her blood sugar this morning was 298, and was 290 before coming in, today.  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  Review of Systems: As above. Otherwise, pt also has had some muscle soreness and weakness along with headache. Denies SOB, chest pain.     Objective:   Physical Exam BP 110/68  Pulse 78  Temp(Src) 97.8 F (36.6 C) (Oral)  Ht 5\' 3"  (1.6 m)  Wt 139 lb (63.05 kg)  BMI 24.63 kg/m2 Gen: well-appearing  HEENT: EOMI, conjunctivae clear, MMM, post oropharynx clear, TMs not well-visualized due wax Card: RRR, no murmur appreciated Pulm: CTAB, very faint occasional wheeze but normal work of breathing with good air movement Abd: soft, nontender, no masses appreciated, BS+ Ext: no swelling/edema, otherwise warm/well-perfused Skin: warm, dry, no rashes appreciated     Assessment & Plan:

## 2012-09-23 ENCOUNTER — Telehealth: Payer: Self-pay | Admitting: *Deleted

## 2012-09-23 NOTE — Telephone Encounter (Signed)
Patient calls reporting diarrhea and vomiting  have stopped. She has continued taking the Zofran and feels it is causing her to be bloated and feel nauseated and wants to know if she needs to continue taking it. Advised she does not have to take this medication. She is able to drink well but continues to feel some nausea. Offered appointment today  but she will wait a few more days to see how she is feeling and call back if continued problem.

## 2012-09-27 ENCOUNTER — Other Ambulatory Visit: Payer: Self-pay | Admitting: Family Medicine

## 2012-09-28 ENCOUNTER — Other Ambulatory Visit: Payer: Self-pay | Admitting: Family Medicine

## 2012-10-08 ENCOUNTER — Other Ambulatory Visit: Payer: Self-pay | Admitting: Family Medicine

## 2012-10-08 DIAGNOSIS — Z1231 Encounter for screening mammogram for malignant neoplasm of breast: Secondary | ICD-10-CM

## 2012-10-16 ENCOUNTER — Telehealth: Payer: Self-pay | Admitting: Family Medicine

## 2012-10-16 NOTE — Telephone Encounter (Signed)
Patient would like to speak to the Triage nurse about possible having an ingrown toenail and she is diabetic that she wants to try and treat herself.  She has to go to work at 9:00.

## 2012-10-16 NOTE — Telephone Encounter (Signed)
Patient will need office visit to check ingrown toenail due to being diabetic.  Called patient through [employer] and left message to call our office back.  Gaylene Brooks, RN

## 2012-10-17 NOTE — Telephone Encounter (Signed)
Patient called back through video interpreter.  C/o "hitting right toe" and now is having pain.  States she cut some of the toenail off.  Patient is diabetic.  Denies any blood, pus, or drainage.  Patient unable to come in today due to her work schedule and requesting appt for Monday.  Scheduled appt with Dr. Lula Olszewski for Monday (4/21) at 9:00 am.  Gaylene Brooks, RN

## 2012-10-18 ENCOUNTER — Ambulatory Visit (HOSPITAL_COMMUNITY)
Admission: RE | Admit: 2012-10-18 | Discharge: 2012-10-18 | Disposition: A | Discharge status: Home / Self Care | Payer: No Typology Code available for payment source | Source: Ambulatory Visit | Attending: Family Medicine | Admitting: Family Medicine

## 2012-10-18 DIAGNOSIS — Z1231 Encounter for screening mammogram for malignant neoplasm of breast: Secondary | ICD-10-CM

## 2012-10-21 ENCOUNTER — Encounter: Payer: Self-pay | Admitting: Family Medicine

## 2012-10-21 ENCOUNTER — Ambulatory Visit (INDEPENDENT_AMBULATORY_CARE_PROVIDER_SITE_OTHER): Payer: No Typology Code available for payment source | Admitting: Family Medicine

## 2012-10-21 VITALS — BP 133/79 | HR 82 | Temp 98.0°F | Wt 143.0 lb

## 2012-10-21 DIAGNOSIS — L600 Ingrowing nail: Secondary | ICD-10-CM

## 2012-10-21 NOTE — Patient Instructions (Signed)

## 2012-10-21 NOTE — Progress Notes (Signed)
  Subjective:    Patient ID: Alicia [name], female    DOB: 10/08/1955, 57 y.o.   MRN: [id number]  HPI  Alicia [name] comes in for right great toe pain, concern for ingrown toe nail.  She says it hurts on the distal lateral side of the right great toe.  She says it was a little red last week but that has gotten better, no drainage, no fever.  She is a diabetic and says her blood sugars have been well controlled, no open sores on her feet.   Review of Systems See HPI    Objective:   Physical Exam BP 133/79  Pulse 82  Temp(Src) 98 F (36.7 C) (Oral)  Wt 143 lb (64.864 kg)  BMI 25.34 kg/m2 General appearance: alert, cooperative and no distress R foot: 2+ DP and PT pulses, no skin abnormalities R Great toe: thickened nail, lateral side appears ingrown and tenderness there, no drainage, erythema or swelling.  No tenderness on medial side.       Assessment & Plan:

## 2012-10-21 NOTE — Assessment & Plan Note (Signed)
Discussed trying conservative therapy for 2 weeks- discussed warm soaks, trying to lift nail, shoes with wide toe box, gave hand out.  Will have her follow up in 2 weeks to see if she is improving.  She would like to try to avoid nail removal for cosmetic reasons.

## 2012-10-30 ENCOUNTER — Ambulatory Visit (INDEPENDENT_AMBULATORY_CARE_PROVIDER_SITE_OTHER): Payer: No Typology Code available for payment source | Admitting: Family Medicine

## 2012-10-30 ENCOUNTER — Encounter: Payer: Self-pay | Admitting: Family Medicine

## 2012-10-30 VITALS — BP 118/54 | HR 82 | Temp 98.1°F | Ht 63.5 in | Wt 143.0 lb

## 2012-10-30 DIAGNOSIS — L600 Ingrowing nail: Secondary | ICD-10-CM

## 2012-10-30 MED ORDER — IBUPROFEN 600 MG PO TABS: 600.0000 mg | ORAL_TABLET | Freq: Three times a day (TID) | ORAL | Status: DC | PRN

## 2012-10-30 NOTE — Progress Notes (Signed)
  Subjective:    Patient ID: Alicia [name], female    DOB: Jan 20, 1956, 57 y.o.   MRN: [id number]  HPI  1. F/u ingrown toenail. Right great toe still having pain at margin. Tried soaking and elevating the nail and did not help. Pain worse with wearing close toed shoes, which she has to do at work. There is no redness, pus, fever, chills, streaking, edema noted. Blood sugars well controlled.  Review of Systems See HPI otherwise negative.  reports that she has never smoked. She has never used smokeless tobacco.     Objective:   Physical Exam  Right great toenail is ingrown at medial margin. moderate TTP. No erythema, skin breakdown or sign of infection. Good DP pulses bilaterally. Sensation intact to light touch distally.     Assessment & Plan:   Procedure Note: Ingrown nail, no infection.  Patient given informed consent, signed copy in the chart. Appropriate time out taken.  Large rubberband tightened with forceps used as tourniquet placed at base of the toe.  Area prepped and draped in usual sterile fashion.  Digital block done using 2% lidocaine without epinephrine, 3cc total. Medial border of the right first nail was isolated by incising the nail longitudinally through it's entire length and into the nail bed with a scissors / scalpel. Nail elevated using nail elevator, removed using hemostats and traction force.  Nail bed was ablated using curettage and phenol followed by copious alcohol wash.  No complications.  Minimal bleeding.  Sterile bandage applied and post procedure instructions given.  Patient tolerated procedure well without complications.

## 2012-10-30 NOTE — Patient Instructions (Addendum)
Keep toe dry and covered next 24-48 hours. Change bandage and place antibiotic ointment tomorrow night. Then can resume water soaks. Check feet daily. Notify doctor if pain worsens, redness or swelling develops.   Toenail Removal Toenails may need to be removed because of injury, infections, or to correct abnormal growth. A special non-stick bandage will likely be put tightly on your toe to prevent bleeding. Often times a new nail will grow back. Sometimes the new nail may be deformed. Most of the time when a nail is lost, it will gradually heal, but may be sensitive for a long time. HOME CARE INSTRUCTIONS   Keep your foot elevated to relieve pain and swelling. This will require lying in bed or on a couch with the leg on pillows or sitting in a recliner with the leg up. Walking or letting your leg dangle may increase swelling, slow healing, and cause throbbing pain.  Keep your bandage dry and clean.  Change your bandage in 24 hours.  After your bandage is changed, soak your foot in warm, soapy water for 10 to 20 minutes. Do this 3 times per day. This helps reduce pain and swelling. After soaking your foot apply a clean, dry bandage. Change your bandage if it is wet or dirty.  Only take over-the-counter or prescription medicines for pain, discomfort, or fever as directed by your caregiver.  See your caregiver as needed for problems. You might need a tetanus shot now if:  You have no idea when you had the last one.  You have never had a tetanus shot before.  The injured area had dirt in it. If you need a tetanus shot, and you decide not to get one, there is a rare chance of getting tetanus. Sickness from tetanus can be serious. If you did get a tetanus shot, your arm may swell, get red and warm to the touch at the shot site. This is common and not a problem. SEEK IMMEDIATE MEDICAL CARE IF:   You have increased pain, swelling, redness, warmth, drainage, or bleeding.  You have a  fever.  You have swelling that spreads from your toe into your foot. Document Released: 03/18/2003 Document Revised: 09/11/2011 Document Reviewed: 06/29/2008 [facility] Patient Information 2013 [place], [place].

## 2012-11-01 ENCOUNTER — Other Ambulatory Visit: Payer: Self-pay | Admitting: Family Medicine

## 2012-11-01 NOTE — Telephone Encounter (Signed)
Patient called saying that she contacted the pharmacy days ago, but we just received the refill request this morning.  She is almost out of this medication, does not have enough to last the weekend and needs this refilled today.

## 2012-11-03 ENCOUNTER — Other Ambulatory Visit: Payer: Self-pay | Admitting: Family Medicine

## 2012-11-07 ENCOUNTER — Other Ambulatory Visit: Payer: Self-pay | Admitting: Family Medicine

## 2012-11-08 ENCOUNTER — Ambulatory Visit: Payer: No Typology Code available for payment source | Admitting: Family Medicine

## 2012-11-21 ENCOUNTER — Ambulatory Visit: Payer: No Typology Code available for payment source | Admitting: Family Medicine

## 2012-11-21 ENCOUNTER — Encounter (HOSPITAL_COMMUNITY): Payer: Self-pay | Admitting: *Deleted

## 2012-11-21 ENCOUNTER — Emergency Department (INDEPENDENT_AMBULATORY_CARE_PROVIDER_SITE_OTHER)
Admission: EM | Admit: 2012-11-21 | Discharge: 2012-11-21 | Disposition: A | Discharge status: Home / Self Care | Payer: No Typology Code available for payment source | Source: Home / Self Care | Attending: Emergency Medicine | Admitting: Emergency Medicine

## 2012-11-21 ENCOUNTER — Telehealth: Payer: Self-pay | Admitting: *Deleted

## 2012-11-21 DIAGNOSIS — J329 Chronic sinusitis, unspecified: Secondary | ICD-10-CM

## 2012-11-21 DIAGNOSIS — S335XXA Sprain of ligaments of lumbar spine, initial encounter: Secondary | ICD-10-CM

## 2012-11-21 MED ORDER — FLUTICASONE PROPIONATE 50 MCG/ACT NA SUSP: 2.0000 | Freq: Every day | NASAL | Status: DC

## 2012-11-21 MED ORDER — FEXOFENADINE-PSEUDOEPHED ER 60-120 MG PO TB12: 1.0000 | ORAL_TABLET | Freq: Two times a day (BID) | ORAL | Status: DC

## 2012-11-21 MED ORDER — MELOXICAM 7.5 MG PO TABS: 7.5000 mg | ORAL_TABLET | Freq: Every day | ORAL | Status: DC

## 2012-11-21 MED ORDER — CYCLOBENZAPRINE HCL 10 MG PO TABS: 10.0000 mg | ORAL_TABLET | Freq: Two times a day (BID) | ORAL | Status: DC | PRN

## 2012-11-21 NOTE — ED Provider Notes (Signed)
History     CSN: [id number]  Arrival date & time 11/21/12  1606   First MD Initiated Contact with Patient 11/21/12 1634      Chief Complaint  Patient presents with  . Back Pain  . Headache    (Consider location/radiation/quality/duration/timing/severity/associated sxs/prior treatment) HPI Comments: Patient presents to urgent care complaining of sudden onset of right lower back pain that radiates to her mid thigh started yesterday. 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 She has taken some ibuprofen for pain with no relief. In the past she used to take some Allegra and Flonase but she has run out of it. Patient denies any further symptoms  such  as urinary or fecal incontinence, perineal numbness, weakness.  Patient is a 57 y.o. female presenting with back pain and headaches. The history is provided by the patient.  Back Pain Location:  Lumbar spine Quality:  Aching Radiates to:  R thigh Pain severity:  Moderate Pain is:  Worse during the day Onset quality:  Gradual Timing:  Constant Progression:  Worsening Context: lifting heavy objects   Context: not emotional stress   Relieved by:  Nothing Worsened by:  Movement, standing, ambulation, bending, palpation and touching Associated symptoms: headaches and leg pain   Associated symptoms: no abdominal pain, no bladder incontinence, no dysuria, no fever, no numbness, no paresthesias, no pelvic pain, no perianal numbness, no tingling, no weakness and no weight loss   Headache Associated symptoms: back pain and congestion   Associated symptoms: no abdominal pain, no cough, no dizziness, no ear pain, no fatigue, no fever, no myalgias, no nausea, no neck stiffness, no numbness, no paresthesias and no vomiting     Past Medical History  Diagnosis Date    . Diabetes mellitus   . Hypertension   . Hyperlipidemia   . Gastroparesis   . Diabetic neuropathy   . GERD (gastroesophageal reflux disease)   . Complete deafness     Meningitis at age 44  . S/P appy   . Deaf   . H/O: C-section     Past Surgical History  Procedure Laterality Date  . Appendectomy    . Tubal ligation    . Cesarean section    . Uterine fibroid embolization  2007  . Cardiolyte ef 77%, no ischemia in 2006  2006  . Wrist surgery      Cyst removed on left  . Wrist surgery Right 1985    tendon repair R wrist    Family History  Problem Relation Age of Onset  . Hypertension Mother   . Cancer Maternal Aunt   . Cancer Maternal Grandmother   . Diabetes Father     History  Substance Use Topics  . Smoking status: Never Smoker   . Smokeless tobacco: Never Used  . Alcohol Use: Yes     Comment: rarely    OB History   Grav Para Term Preterm Abortions TAB SAB Ect Mult Living                  Review of Systems  Constitutional: Positive for activity change. Negative for fever, chills, weight loss, diaphoresis, appetite change, fatigue and unexpected weight change.  HENT: Positive for congestion and rhinorrhea. Negative for ear pain, nosebleeds, trouble swallowing, neck stiffness, voice change and tinnitus.   Respiratory: Negative for cough  and shortness of breath.   Gastrointestinal: Negative for nausea, vomiting, abdominal pain, abdominal distention, anal bleeding and rectal pain.  Genitourinary: Negative for bladder incontinence, dysuria and pelvic pain.  Musculoskeletal: Positive for back pain. Negative for myalgias, joint swelling and arthralgias.  Skin: Negative for rash.  Neurological: Positive for headaches. Negative for dizziness, tingling, tremors, weakness, numbness and paresthesias.    Allergies  Sulfonamide derivatives; Lipitor; and Ramipril  Home Medications   Current Outpatient Rx  Name  Route  Sig  Dispense  Refill  . amLODipine (NORVASC)  10 MG tablet      TAKE 1 TABLET BY MOUTH EVERY DAY   30 tablet   11   . aspirin (BAYER CHILDRENS ASPIRIN) 81 MG chewable tablet   Oral   Chew 81 mg by mouth daily.           . Calcium Carbonate-Vitamin D (CALCIUM 600/VITAMIN D) 600-400 MG-UNIT per chew tablet   Oral   Chew 1 tablet by mouth daily.         . CRESTOR 10 MG tablet      TAKE 1 TABLET BY MOUTH EVERY DAY   30 tablet   5   . ferrous sulfate 325 (65 FE) MG tablet   Oral   Take 1 tablet (325 mg total) by mouth daily with breakfast.      3   . gabapentin (NEURONTIN) 300 MG capsule   Oral   Take 1 capsule (300 mg total) by mouth 4 (four) times daily. Take 1 cap three times daily and 2 caps at bedtime.   150 capsule   5   . hydrochlorothiazide (HYDRODIURIL) 25 MG tablet      TAKE 1/2 TABLET (12.5 MG TOTAL) BY MOUTH EVERY DAY   30 tablet   5   . ibuprofen (ADVIL,MOTRIN) 600 MG tablet   Oral   Take 1 tablet (600 mg total) by mouth every 8 (eight) hours as needed for pain.   30 tablet   0   . insulin glargine (LANTUS) 100 UNIT/ML injection   Subcutaneous   Inject 6 Units into the skin at bedtime.         Marland Kitchen losartan (COZAAR) 100 MG tablet      TAKE 1 TABLET BY MOUTH EVERY DAY   90 tablet   3   . metFORMIN (GLUCOPHAGE) 1000 MG tablet      TAKE 1 TABLET BY MOUTH TWO TIMES DAILY.   60 tablet   5   . metoprolol succinate (TOPROL-XL) 50 MG 24 hr tablet   Oral   Take 1 tablet (50 mg total) by mouth daily. Take with or immediately following a meal.   90 tablet   3   . NOVOLOG 100 UNIT/ML injection      TAKE AS DIRECTED PER GLUCOSE 150-200 GIVE 2UNITS, IF 201-250 GIVE 3UNITS >250 GIVE 4 UNITS   10 mL   2   . polyethylene glycol powder (GLYCOLAX/MIRALAX) powder      TAKE 17 GRAMS BY MOUTH 3 TIMES A WEEK, MIX WITH 8 OZ OF WATER. TAKE AS NEEDED FOR CONSTIPATION   527 g   4   . sodium chloride (OCEAN) 0.65 % nasal spray   Nasal   Place 1 spray into the nose as needed for congestion.   15  mL   12   . cyanocobalamin 500 MCG tablet   Oral   Take 500 mcg by mouth daily.         Marland Kitchen  cyclobenzaprine (FLEXERIL) 10 MG tablet   Oral   Take 1 tablet (10 mg total) by mouth 2 (two) times daily as needed for muscle spasms.   10 tablet   0   . EXPIRED: diclofenac (VOLTAREN) 75 MG EC tablet   Oral   Take 1 tablet (75 mg total) by mouth 2 (two) times daily as needed.   60 tablet   2   . Ergocalciferol 2000 UNITS TABS   Oral   Take 1 tablet by mouth daily.         . fexofenadine-pseudoephedrine (ALLEGRA-D) 60-120 MG per tablet   Oral   Take 1 tablet by mouth 2 (two) times daily.   60 tablet   0   . fexofenadine-pseudoephedrine (ALLEGRA-D) 60-120 MG per tablet   Oral   Take 1 tablet by mouth every 12 (twelve) hours.   15 tablet   0   . fluticasone (FLONASE) 50 MCG/ACT nasal spray      2 sprays to each nostril daily. Use nasal saline before use.   16 g   11   . fluticasone (FLONASE) 50 MCG/ACT nasal spray   Nasal   Place 2 sprays into the nose daily.   16 g   2   . meloxicam (MOBIC) 7.5 MG tablet   Oral   Take 1 tablet (7.5 mg total) by mouth daily. Take one tablet daily for 2 weeks   14 tablet   0   . ondansetron (ZOFRAN) 4 MG tablet   Oral   Take 1 tablet (4 mg total) by mouth every 8 (eight) hours as needed for nausea.   30 tablet   0     BP 122/62  Pulse 76  Temp(Src) 98.1 F (36.7 C) (Oral)  Resp 16  SpO2 100%  Physical Exam  Constitutional: Vital signs are normal. She appears well-developed and well-nourished.  Non-toxic appearance. She does not have a sickly appearance. She does not appear ill. No distress.  HENT:  Head: Normocephalic.  Mouth/Throat: No oropharyngeal exudate.  Eyes: Conjunctivae are normal. Scleral icterus is present.  Neck: Neck supple. No JVD present.  Cardiovascular: Exam reveals no gallop and no friction rub.   No murmur heard. Pulmonary/Chest: Effort normal and breath sounds normal.  Abdominal: Soft.   Musculoskeletal:       Back:  Lymphadenopathy:    She has no cervical adenopathy.  Neurological: She is alert.  Skin: No rash noted. No erythema.    ED Course  Procedures (including critical care time)  Labs Reviewed - No data to display No results found.   1. Sinusitis   2. Lumbar sprain, initial encounter       MDM  Problem #1 Lumbar sprain/strain patient has been prescribed a course of meloxicam along with Flexeril. Encouraged to stretch her back as soon as she can tolerate. Instructed also to followup with her PCP discomfort persists beyond 7-10 days.  Problem #2 Headache/coexistent with seasonal allergy symptoms most likely, patient to restart Allegra-D along with Flonase.       Jimmie Molly, MD 11/21/12 1728

## 2012-11-21 NOTE — Telephone Encounter (Signed)
Pt reports lower back pain, unable to bend or pick things up - also states that she is having foot pain. Wishes to be seen today - Appointment made for today at 130 on over flow. Message left on interrupter line requesting sign language  assistance.   Wyatt Haste, RN-BSN

## 2012-11-21 NOTE — ED Notes (Addendum)
C/o low back pain that radiates to R mid thigh onset yesterday.  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  Also c/o headache on set last Fri.  Taking Ibuprofen 600 mg. without relief. Has seasonal allergies and is taking something for headache. Pt. here with deaf interpreter.

## 2012-11-26 ENCOUNTER — Telehealth: Payer: Self-pay | Admitting: *Deleted

## 2012-11-26 NOTE — Telephone Encounter (Signed)
Pt call using sign language interrupter.  Pt reports ingrown fingernail that was removed but is still very tender to touch - had it removed on May 5 per pt. Pt reports that she has tried soaking in Epsom salt without relief. Pt pt would like to be seen. Appointment made for 845 Wed. Morning. Interpreter called for sign language - message left.Wyatt Haste, RN-BSN

## 2012-11-27 ENCOUNTER — Ambulatory Visit (HOSPITAL_COMMUNITY)
Admission: RE | Admit: 2012-11-27 | Discharge: 2012-11-27 | Disposition: A | Discharge status: Home / Self Care | Payer: No Typology Code available for payment source | Source: Ambulatory Visit | Attending: Family Medicine | Admitting: Family Medicine

## 2012-11-27 ENCOUNTER — Encounter: Payer: Self-pay | Admitting: Family Medicine

## 2012-11-27 ENCOUNTER — Ambulatory Visit (INDEPENDENT_AMBULATORY_CARE_PROVIDER_SITE_OTHER): Payer: No Typology Code available for payment source | Admitting: Family Medicine

## 2012-11-27 VITALS — BP 129/72 | HR 82 | Temp 98.4°F | Ht 63.5 in | Wt 141.0 lb

## 2012-11-27 DIAGNOSIS — M79609 Pain in unspecified limb: Secondary | ICD-10-CM

## 2012-11-27 DIAGNOSIS — M79674 Pain in right toe(s): Secondary | ICD-10-CM

## 2012-11-27 DIAGNOSIS — L600 Ingrowing nail: Secondary | ICD-10-CM | POA: Insufficient documentation

## 2012-11-27 DIAGNOSIS — E119 Type 2 diabetes mellitus without complications: Secondary | ICD-10-CM | POA: Insufficient documentation

## 2012-11-27 DIAGNOSIS — M79676 Pain in unspecified toe(s): Secondary | ICD-10-CM | POA: Insufficient documentation

## 2012-11-27 NOTE — Patient Instructions (Signed)
It was good to meet you today.  Lets check an X-ray today to make sure there is not a deep infection. Continue to use the epsom salt soaks and use a clean wooden stick to clean around the toe.  If it is still bothering you in 1-2 weeks, please make an appointment for another toenail removal procedure.  Arbor Cohen M. Nickey Canedo, M.D.

## 2012-11-27 NOTE — Assessment & Plan Note (Signed)
No signs of local infection or indication for toenail excision at this time. Will get plain X-ray to eval for osteo or underlying infection causing the pain. Could be sensitive due to the previous ingrown nail. For now, soak in epsom salt and use a clean object to work around the toenail to keep it from getting ingrown again. If the pain does not resolve, pt instructed to call for a procedure to have more nail removed. She agrees with this plan.

## 2012-11-27 NOTE — Progress Notes (Signed)
Patient ID: KALEA PERINE, female   DOB: 09-08-1955, 57 y.o.   MRN: [id number]  [facility] Kiki Bivens M. Lory Galan, MD Phone: [phone]   Subjective: HPI: Patient is a 57 y.o. female presenting to clinic today for same day visit for toe pain. She has a sign language interpreter with her that was used for entire history and physical Pt reports the skin is sensitive around the toenail. Does not hurt when walking but when she touches the inside of the great toe it is very sensitive. No redness or swelling. Dr. Cristal Ford removed part of toenail on 10/30/12 which made it feel better but it still very sensitive. No antibiotics recently. No bleeding recently. She is able to point to the exact location of the pain. Does not radiate. No numbness or tingling. Of note, patient is a diabetic and her blood sugar has been controlled.  History Reviewed: Never smoker.  ROS: Please see HPI above.  Objective: Office vital signs reviewed. There were no vitals taken for this visit.  Physical Examination:  General: Awake, alert. NAD Extremities: No edema on legs. Right great toenail with pinpoint tenderness of the distal lateral edge of the toenail. Small segment of nail has been removed and does not appear to be ingrown at the time. No redness, fluctuance or swelling. Full ROM of toes Neurovascular: Grossly intact with good pulses and cap refill.   Assessment: 57 y.o. female with toe pain  Plan: See Problem List and After Visit Summary

## 2012-12-16 ENCOUNTER — Ambulatory Visit: Payer: No Typology Code available for payment source | Admitting: Family Medicine

## 2012-12-17 ENCOUNTER — Ambulatory Visit (INDEPENDENT_AMBULATORY_CARE_PROVIDER_SITE_OTHER): Payer: No Typology Code available for payment source | Admitting: Ophthalmology

## 2012-12-23 ENCOUNTER — Ambulatory Visit: Payer: No Typology Code available for payment source | Admitting: Family Medicine

## 2012-12-23 ENCOUNTER — Ambulatory Visit: Payer: Self-pay | Admitting: Family Medicine

## 2012-12-27 ENCOUNTER — Encounter: Payer: Self-pay | Admitting: Family Medicine

## 2012-12-27 ENCOUNTER — Ambulatory Visit (INDEPENDENT_AMBULATORY_CARE_PROVIDER_SITE_OTHER): Payer: No Typology Code available for payment source | Admitting: Family Medicine

## 2012-12-27 VITALS — BP 120/55 | HR 87 | Temp 98.1°F | Ht 63.5 in | Wt 142.0 lb

## 2012-12-27 DIAGNOSIS — M79609 Pain in unspecified limb: Secondary | ICD-10-CM

## 2012-12-27 DIAGNOSIS — E1165 Type 2 diabetes mellitus with hyperglycemia: Secondary | ICD-10-CM

## 2012-12-27 DIAGNOSIS — M79674 Pain in right toe(s): Secondary | ICD-10-CM

## 2012-12-27 LAB — POCT GLYCOSYLATED HEMOGLOBIN (HGB A1C): Hemoglobin A1C: 7.1

## 2012-12-27 NOTE — Assessment & Plan Note (Signed)
A1c improved to 7.1.   Due to hypoglycemic episodes, will decrease Lantus to 4 units daily.  She has an emergency plan for hypoglycemia.  Patient to follow up with new doctor to repeat A1c in 3 months.

## 2012-12-27 NOTE — Progress Notes (Signed)
  Subjective:     Alicia [name] is a 57 y.o. female who presents for follow up of diabetes.. Current symptoms include: hyperglycemia, hypoglycemia  and nausea. Patient denies paresthesia of the feet, visual disturbances, vomiting and weight loss.  Evaluation to date has been: hemoglobin A1C. Home sugars: BGs range between 30 and 300.   She complains of nausea and confusion when CBG is low and eats candy, juice, or glucola tablets.  In the last month, she has had CBG < 60 about 3 times only.  Current treatments: Metformin 1000 BID, Lantus 6 units at bedtime, and Novolog if > 150 after meals. Last dilated eye exam this past February.  Review of Systems Per HPI   Objective:    BP 120/55  Pulse 87  Temp(Src) 98.1 F (36.7 C) (Oral)  Ht 5' 3.5" (1.613 m)  Wt 142 lb (64.411 kg)  BMI 24.76 kg/m2 General appearance: alert, cooperative and no distress Lungs: clear to auscultation bilaterally Heart: regular rate and rhythm, S1, S2 normal, no murmur, click, rub or gallop Abdomen: soft, non-tender; bowel sounds normal; no masses,  no organomegaly Skin: feet - no open wounds or ulcers bilaterally; RT big toe is tender on palpation without drainage or erythema; partial toenail that had been removed has grown back       Assessment:   Diabetes type 2.   Plan:   See Problem List

## 2012-12-27 NOTE — Assessment & Plan Note (Signed)
Possible ingrown toenail recurrence.  Patient to schedule follow up with new doctor to discuss possible removal.

## 2012-12-27 NOTE — Patient Instructions (Addendum)
It was nice to meet you today, Alicia [name]. Your hemoglobin A1c today was: Continue taking Metformin 1000 mg twice per day. Decrease Lantus to 4 units daily at bedtime to hopefully avoid low blood sugars (less than 60). If you do check your sugar and it is less than 60, PLEASE call your doctor or return to clinic.  Schedule an appointment with your new doctor to discuss ingrown toe nail removal of RT big toe.   Diet Recommendations for Diabetes   Starchy (carb) foods include: Bread, rice, pasta, potatoes, corn, crackers, bagels, muffins, all baked goods.   Protein foods include: Meat, fish, poultry, eggs, dairy foods, and beans such as pinto and kidney beans (beans also provide carbohydrate).   1. Eat at least 3 meals and 1-2 snacks per day. Never go more than 4-5 hours while awake without eating.  2. Limit starchy foods to TWO per meal and ONE per snack. ONE portion of a starchy  food is equal to the following:   - ONE slice of bread (or its equivalent, such as half of a hamburger bun).   - 1/2 cup of a "scoopable" starchy food such as potatoes or rice.   - 1 OUNCE (28 grams) of starchy snack foods such as crackers or pretzels (look on label).   - 15 grams of carbohydrate as shown on food label.  3. Both lunch and dinner should include a protein food, a carb food, and vegetables.   - Obtain twice as many veg's as protein or carbohydrate foods for both lunch and dinner.   - Try to keep frozen veg's on hand for a quick vegetable serving.     - Fresh or frozen veg's are best.  4. Breakfast should always include protein.

## 2013-01-01 ENCOUNTER — Other Ambulatory Visit: Payer: Self-pay | Admitting: Family Medicine

## 2013-01-02 ENCOUNTER — Ambulatory Visit (INDEPENDENT_AMBULATORY_CARE_PROVIDER_SITE_OTHER): Payer: No Typology Code available for payment source | Admitting: Ophthalmology

## 2013-01-02 DIAGNOSIS — E1139 Type 2 diabetes mellitus with other diabetic ophthalmic complication: Secondary | ICD-10-CM

## 2013-01-02 DIAGNOSIS — E11359 Type 2 diabetes mellitus with proliferative diabetic retinopathy without macular edema: Secondary | ICD-10-CM

## 2013-01-02 DIAGNOSIS — H35039 Hypertensive retinopathy, unspecified eye: Secondary | ICD-10-CM

## 2013-01-02 DIAGNOSIS — I10 Essential (primary) hypertension: Secondary | ICD-10-CM

## 2013-01-02 DIAGNOSIS — H2510 Age-related nuclear cataract, unspecified eye: Secondary | ICD-10-CM

## 2013-01-02 DIAGNOSIS — H43819 Vitreous degeneration, unspecified eye: Secondary | ICD-10-CM

## 2013-01-04 ENCOUNTER — Emergency Department (INDEPENDENT_AMBULATORY_CARE_PROVIDER_SITE_OTHER): Payer: No Typology Code available for payment source

## 2013-01-04 ENCOUNTER — Emergency Department (HOSPITAL_COMMUNITY)
Admission: EM | Admit: 2013-01-04 | Discharge: 2013-01-04 | Disposition: A | Discharge status: Home / Self Care | Payer: No Typology Code available for payment source | Source: Home / Self Care

## 2013-01-04 DIAGNOSIS — Z23 Encounter for immunization: Secondary | ICD-10-CM

## 2013-01-04 DIAGNOSIS — E7800 Pure hypercholesterolemia, unspecified: Secondary | ICD-10-CM

## 2013-01-04 DIAGNOSIS — E1149 Type 2 diabetes mellitus with other diabetic neurological complication: Secondary | ICD-10-CM

## 2013-01-04 DIAGNOSIS — L600 Ingrowing nail: Secondary | ICD-10-CM

## 2013-01-04 DIAGNOSIS — S2232XA Fracture of one rib, left side, initial encounter for closed fracture: Secondary | ICD-10-CM

## 2013-01-04 DIAGNOSIS — S2239XA Fracture of one rib, unspecified side, initial encounter for closed fracture: Secondary | ICD-10-CM

## 2013-01-04 DIAGNOSIS — E118 Type 2 diabetes mellitus with unspecified complications: Secondary | ICD-10-CM

## 2013-01-04 DIAGNOSIS — I10 Essential (primary) hypertension: Secondary | ICD-10-CM

## 2013-01-04 DIAGNOSIS — IMO0001 Reserved for inherently not codable concepts without codable children: Secondary | ICD-10-CM

## 2013-01-04 DIAGNOSIS — E1165 Type 2 diabetes mellitus with hyperglycemia: Secondary | ICD-10-CM

## 2013-01-04 DIAGNOSIS — N289 Disorder of kidney and ureter, unspecified: Secondary | ICD-10-CM

## 2013-01-04 MED ORDER — IBUPROFEN 600 MG PO TABS: 600.0000 mg | ORAL_TABLET | Freq: Three times a day (TID) | ORAL | Status: DC | PRN

## 2013-01-04 MED ORDER — IBUPROFEN 600 MG PO TABS: 600.0000 mg | ORAL_TABLET | Freq: Three times a day (TID) | ORAL | Status: AC | PRN

## 2013-01-04 MED ORDER — TRAMADOL HCL 50 MG PO TABS: 50.0000 mg | ORAL_TABLET | Freq: Three times a day (TID) | ORAL | Status: DC | PRN

## 2013-01-04 NOTE — ED Notes (Signed)
Pt needs a interpreter one has been called and will be here here in about 30 minutes per Madelaine Bhat the registar out front

## 2013-01-04 NOTE — ED Provider Notes (Signed)
History    CSN: [id number] Arrival date & time 01/04/13  0902  None    Chief Complaint  Patient presents with  . Flank Pain   (Consider location/radiation/quality/duration/timing/severity/associated sxs/prior Treatment) HPI Comments: Alicia [name] is a very pleasant 57 yo black female who presents with left sided rib pain. About a week ago while playing with her grand daughter, she felt a kick to her left side and had immediate pain. The pain has not subsided and slightly worsened. Pain is flank region, below diaphragm. No SOB, pain with breathing or cough. Pain worse with moving and lifting.   Patient is a 57 y.o. female presenting with flank pain. The history is provided by the patient. The history is limited by a language barrier. A language interpreter was used (Sign interpreter-Deaf).  Flank Pain Pertinent negatives include no shortness of breath.   Past Medical History  Diagnosis Date  . Diabetes mellitus   . Hypertension   . Hyperlipidemia   . Gastroparesis   . Diabetic neuropathy   . GERD (gastroesophageal reflux disease)   . Complete deafness     Meningitis at age 3  . S/P appy   . Deaf   . H/O: C-section    Past Surgical History  Procedure Laterality Date  . Appendectomy    . Tubal ligation    . Cesarean section    . Uterine fibroid embolization  2007  . Cardiolyte ef 77%, no ischemia in 2006  2006  . Wrist surgery      Cyst removed on left  . Wrist surgery Right 1985    tendon repair R wrist   Family History  Problem Relation Age of Onset  . Hypertension Mother   . Cancer Maternal Aunt   . Cancer Maternal Grandmother   . Diabetes Father    History  Substance Use Topics  . Smoking status: Never Smoker   . Smokeless tobacco: Never Used  . Alcohol Use: Yes     Comment: rarely   OB History   Grav Para Term Preterm Abortions TAB SAB Ect Mult Living                 Review of Systems  Constitutional: Negative.   Respiratory: Negative for apnea,  cough, chest tightness, shortness of breath, wheezing and stridor.   Cardiovascular: Negative.   Gastrointestinal: Negative.   Genitourinary: Positive for flank pain.    Allergies  Sulfonamide derivatives; Lipitor; and Ramipril  Home Medications   Current Outpatient Rx  Name  Route  Sig  Dispense  Refill  . amLODipine (NORVASC) 10 MG tablet      TAKE 1 TABLET BY MOUTH EVERY DAY   30 tablet   11   . aspirin (BAYER CHILDRENS ASPIRIN) 81 MG chewable tablet   Oral   Chew 81 mg by mouth daily.           . Calcium Carbonate-Vitamin D (CALCIUM 600/VITAMIN D) 600-400 MG-UNIT per chew tablet   Oral   Chew 1 tablet by mouth daily.         . CRESTOR 10 MG tablet      TAKE 1 TABLET BY MOUTH EVERY DAY   30 tablet   5   . cyanocobalamin 500 MCG tablet   Oral   Take 500 mcg by mouth daily.         . cyclobenzaprine (FLEXERIL) 10 MG tablet   Oral   Take 1 tablet (10 mg total) by mouth 2 (two)  times daily as needed for muscle spasms.   10 tablet   0   . Ergocalciferol 2000 UNITS TABS   Oral   Take 1 tablet by mouth daily.         . ferrous sulfate 325 (65 FE) MG tablet   Oral   Take 1 tablet (325 mg total) by mouth daily with breakfast.      3   . fexofenadine-pseudoephedrine (ALLEGRA-D) 60-120 MG per tablet   Oral   Take 1 tablet by mouth every 12 (twelve) hours.   15 tablet   0   . fluticasone (FLONASE) 50 MCG/ACT nasal spray      2 sprays to each nostril daily. Use nasal saline before use.   16 g   11   . fluticasone (FLONASE) 50 MCG/ACT nasal spray   Nasal   Place 2 sprays into the nose daily.   16 g   2   . gabapentin (NEURONTIN) 300 MG capsule   Oral   Take 1 capsule (300 mg total) by mouth 4 (four) times daily. Take 1 cap three times daily and 2 caps at bedtime.   150 capsule   5   . hydrochlorothiazide (HYDRODIURIL) 25 MG tablet      TAKE 1/2 TABLET (12.5 MG TOTAL) BY MOUTH EVERY DAY   30 tablet   5   . insulin glargine (LANTUS) 100  UNIT/ML injection   Subcutaneous   Inject 6 Units into the skin at bedtime.         Marland Kitchen losartan (COZAAR) 100 MG tablet      TAKE 1 TABLET BY MOUTH EVERY DAY   90 tablet   3   . meloxicam (MOBIC) 7.5 MG tablet   Oral   Take 1 tablet (7.5 mg total) by mouth daily. Take one tablet daily for 2 weeks   14 tablet   0   . metFORMIN (GLUCOPHAGE) 1000 MG tablet      TAKE 1 TABLET BY MOUTH TWO TIMES DAILY.   60 tablet   5   . metoprolol succinate (TOPROL-XL) 50 MG 24 hr tablet   Oral   Take 1 tablet (50 mg total) by mouth daily. Take with or immediately following a meal.   90 tablet   3   . NOVOLOG 100 UNIT/ML injection      TAKE AS DIRECTED PER GLUCOSE 150-200 GIVE 2UNITS, IF 201-250 GIVE 3UNITS >250 GIVE 4 UNITS   10 mL   2   . ondansetron (ZOFRAN) 4 MG tablet   Oral   Take 1 tablet (4 mg total) by mouth every 8 (eight) hours as needed for nausea.   30 tablet   0   . polyethylene glycol powder (GLYCOLAX/MIRALAX) powder      TAKE 17 GRAMS BY MOUTH 3 TIMES A WEEK, MIX WITH 8 OZ OF WATER. TAKE AS NEEDED FOR CONSTIPATION   527 g   4   . EXPIRED: diclofenac (VOLTAREN) 75 MG EC tablet   Oral   Take 1 tablet (75 mg total) by mouth 2 (two) times daily as needed.   60 tablet   2   . EXPIRED: fexofenadine-pseudoephedrine (ALLEGRA-D) 60-120 MG per tablet   Oral   Take 1 tablet by mouth 2 (two) times daily.   60 tablet   0   . ibuprofen (ADVIL,MOTRIN) 600 MG tablet   Oral   Take 1 tablet (600 mg total) by mouth every 8 (eight) hours as needed for pain.  21 tablet   0   . EXPIRED: sodium chloride (OCEAN) 0.65 % nasal spray   Nasal   Place 1 spray into the nose as needed for congestion.   15 mL   12   . traMADol (ULTRAM) 50 MG tablet   Oral   Take 1 tablet (50 mg total) by mouth every 8 (eight) hours as needed for pain (as needed for rib pain).   15 tablet   0    BP 144/77  Pulse 88  Temp(Src) 97.4 F (36.3 C) (Oral)  Resp 12  SpO2 98% Physical Exam   Constitutional: She is oriented to person, place, and time. She appears well-developed and well-nourished. No distress.  HENT:  Head: Normocephalic and atraumatic.  Cardiovascular: Normal rate, regular rhythm and normal heart sounds.   Pulmonary/Chest: Effort normal and breath sounds normal. No respiratory distress. She has no wheezes. She has no rales. She exhibits tenderness.  Left lower axillar rib painful along area of 9th and 10th rib, pain with palpation. No ecchymosis or wounds are noted.  Neurological: She is alert and oriented to person, place, and time.  Skin: Skin is warm and dry. She is not diaphoretic.  Psychiatric: Her behavior is normal.    ED Course  Procedures (including critical care time) Labs Reviewed - No data to display Dg Ribs Unilateral W/chest Left  01/04/2013   *RADIOLOGY REPORT*  Clinical Data: Left chest injury with continued pain.  Flank pain.  LEFT RIBS AND CHEST - 3+ VIEW  Comparison: Multiple exams, including 05/12/2008  Findings: No pneumothorax or pleural effusion noted.  Heart size within normal limits.  Vague nodular density projecting over the left upper lobe on the initial projection is not visible on the other projections of the left chest and accordingly is thought to likely have been artifactual.  Subtle cortical irregularity along the left anterior 9th rib may represent nondisplaced fracture.  IMPRESSION:  1.  Suspected nondisplaced fracture along the left anterior 9th rib.  No pneumothorax or pleural effusion.   Original Report Authenticated By: Gaylyn Rong, M.D.     MDM  9th rib fracture-Non-displaced without Respiratory symptoms. Rec. Symptomatic care with  Rest, no lifting and meds prn.   Azucena Fallen, PA-C 01/04/13 1157

## 2013-01-04 NOTE — ED Notes (Signed)
Interpreter arrived pt brought back and vitals done

## 2013-01-04 NOTE — ED Provider Notes (Signed)
Medical screening examination/treatment/procedure(s) were performed by non-physician practitioner and as supervising physician I was immediately available for consultation/collaboration.  Leslee Home, M.D.  Reuben Likes, MD 01/04/13 Mikle Bosworth

## 2013-01-04 NOTE — ED Notes (Signed)
Complaining of side (rib) pain since Saturday.  Patient states she was playing with her daughter and was hit in the rib.  States hurts when she moves but not tender to touch.

## 2013-01-08 ENCOUNTER — Ambulatory Visit (INDEPENDENT_AMBULATORY_CARE_PROVIDER_SITE_OTHER): Payer: No Typology Code available for payment source | Admitting: Family Medicine

## 2013-01-08 ENCOUNTER — Encounter: Payer: Self-pay | Admitting: Family Medicine

## 2013-01-08 VITALS — BP 125/74 | HR 74 | Temp 97.8°F | Ht 63.5 in | Wt 141.1 lb

## 2013-01-08 DIAGNOSIS — I10 Essential (primary) hypertension: Secondary | ICD-10-CM

## 2013-01-08 DIAGNOSIS — IMO0001 Reserved for inherently not codable concepts without codable children: Secondary | ICD-10-CM

## 2013-01-08 DIAGNOSIS — IMO0002 Reserved for concepts with insufficient information to code with codable children: Secondary | ICD-10-CM

## 2013-01-08 DIAGNOSIS — E1165 Type 2 diabetes mellitus with hyperglycemia: Secondary | ICD-10-CM

## 2013-01-08 DIAGNOSIS — E7800 Pure hypercholesterolemia, unspecified: Secondary | ICD-10-CM

## 2013-01-08 DIAGNOSIS — L600 Ingrowing nail: Secondary | ICD-10-CM

## 2013-01-08 DIAGNOSIS — E118 Type 2 diabetes mellitus with unspecified complications: Secondary | ICD-10-CM

## 2013-01-08 DIAGNOSIS — S2232XD Fracture of one rib, left side, subsequent encounter for fracture with routine healing: Secondary | ICD-10-CM

## 2013-01-08 DIAGNOSIS — S2239XA Fracture of one rib, unspecified side, initial encounter for closed fracture: Secondary | ICD-10-CM | POA: Insufficient documentation

## 2013-01-08 NOTE — Patient Instructions (Signed)
It was so good to meet you, Mrs. Canales.  I look forward to seeing you again.  We have changed your insulin during this visit due to an episode of hypoglycemia.  Your lantus has been decreased to 4 units at bedtime, and novolog sliding scale has changed where: 150-200: 1 unit; 200-250: 2 units; >250 4 units.   I'm sorry you are having such bad rib pain.  You have a note that exempts you from lifting anything over 15 lbs at work.   Because we have changed your medications, I would like to see you again in 2 weeks.  And because you have not had a lipid profile in a couple years, I would like you to come in only for a blood test sometime before that visit.  You must be fasting for this drug test.  You can call to schedule that any time that is convenient for you.    Thanks again,  Dr. Jarvis Newcomer

## 2013-01-08 NOTE — Progress Notes (Signed)
Subjective:     Patient ID: Alicia [name], female   DOB: 1955/08/13, 57 y.o.   MRN: [id number]  HPI  Alicia [name] is a 57 yo female here today for a recent rib fracture, chronic ingrown toenail, and chronic disease management follow-up.   1) Rib fracture: while playing with grandson on Saturday, diagnosed by xray at urgent care 7/5. 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  Still with moderate pain controlled with tramadol 50mg , ibuprofen 600mg  prn. She was advised that she can't lift anything over 15 lbs for 4 weeks, and that this injury will heal on its own.  Sneezing, cough, laughing precipitate pain.   2) Right great toe ingrown toenail: This is a chronic problem for her, and this appointment was schedule to intervene on her latest flare of pain, though she reports no pain or other issues in the past two weeks with it.  She has no complaints about it at this time.   3) Diabetes with episode of hypoglycemia: On metformin. Pt previously took 6 units lantus qHS, has decreased to 4 units qHS. Also on very modest dose of sliding scale novolog. Checks BG 4x/day with range 70-160 recently. Does reports episodes of hypoglycemia rarely with reading in the high 20s at one point.   4) Hypertension: Pt with stable management of this condition, currently not checking BP at home.   5) Hyperlipidemia: On crestor 10mg  without complaints of myalgia.   Pt is deaf, history obtained through sign language interpreter.    Review of Systems  Constitutional: Negative for fever and chills.  HENT: Negative for ear pain.   Eyes: Negative for visual disturbance.  Respiratory: Negative for shortness of breath and wheezing.   Cardiovascular: Negative for chest pain, palpitations and leg swelling.  Musculoskeletal: Negative for myalgias.  Neurological: Negative for dizziness and headaches.  Hematological: Does not bruise/bleed easily.  All other systems reviewed and are negative.       Objective:   Physical Exam  Constitutional: She is oriented to person, place, and time. She appears well-developed and well-nourished.  HENT:  Head: Normocephalic and atraumatic.  Mouth/Throat: Oropharynx is clear and moist.  Eyes: EOM are normal. Pupils are equal, round, and reactive to light.  Neck: Normal range of motion. Neck supple.  Cardiovascular: Normal rate, regular rhythm, normal heart sounds and intact distal pulses.   No murmur heard. Pulmonary/Chest: Effort normal and breath sounds normal. She has no wheezes. She exhibits tenderness.  Deep breaths elicit pain localized to anterior/lateral L chest at diaphragm.   Abdominal: Soft. Bowel sounds are normal. She exhibits no distension. There is no tenderness.  Musculoskeletal: Normal range of motion.  Neurological: She is alert and oriented to person, place, and time.  Skin: Skin is warm and dry.  Right great toe appears normal, fully grown and recently filed without erythema, purulence, or pain on palpation. No bleeding noted.        Assessment:     1) Rib fracture: closed, non-displaced. Will heal on its own. Not currently limiting breathing significantly. A work note is given for return to work 7/10, disallowing lifting over 15 lbs.   2) Ingrown toenail: currently asymptomatic.   3) Diabetes: Given episodes of hypoglycemia and Hgb A1c 7.1 Pt's insulin regimen was decreased to Lantus 4 units qHS with novolog sliding scale:  150-200 1 unit, 200-250 2 units, >250 4 units and recheck.   4) Hypertension: Pt with stable management of this  condition.  5) Hyperlipidemia: On crestor 10mg  without complaints of myalgia. Previous fasting lipid panel was Feb 2010. Will recheck.      Plan:     Follow up in 2 months given medication changes, with fasting lipid panel in the interim. Followup sooner with issues related to great toe or chest tenderness.

## 2013-01-24 ENCOUNTER — Encounter: Payer: Self-pay | Admitting: Pharmacist

## 2013-01-24 ENCOUNTER — Other Ambulatory Visit: Payer: No Typology Code available for payment source

## 2013-01-24 ENCOUNTER — Ambulatory Visit (INDEPENDENT_AMBULATORY_CARE_PROVIDER_SITE_OTHER): Payer: No Typology Code available for payment source | Admitting: Pharmacist

## 2013-01-24 VITALS — BP 131/77 | HR 71 | Ht 63.0 in | Wt 143.0 lb

## 2013-01-24 DIAGNOSIS — E7800 Pure hypercholesterolemia, unspecified: Secondary | ICD-10-CM

## 2013-01-24 DIAGNOSIS — E1165 Type 2 diabetes mellitus with hyperglycemia: Secondary | ICD-10-CM

## 2013-01-24 LAB — LIPID PANEL
Cholesterol: 153 mg/dL (ref 0–200)
HDL: 75 mg/dL (ref >39)
LDL Cholesterol: 71 mg/dL (ref 0–99)
Total CHOL/HDL Ratio: 2 Ratio
Triglycerides: 33 mg/dL (ref <150)
VLDL: 7 mg/dL (ref 0–40)

## 2013-01-24 MED ORDER — POLYETHYLENE GLYCOL 3350 17 GM/SCOOP PO POWD: 17.0000 g | ORAL | Status: DC

## 2013-01-24 NOTE — Assessment & Plan Note (Signed)
Diabetes longstanding currently with . Lab Results  Component Value Date   HGBA1C 7.1 12/27/2012   AND home fasting CBG readings of 100-200 and 2 hour post-prandial/random CBG readings of 100-250 (majority of readings < 200).  Denies hypoglycemic events and is able to verbalize appropriate hypoglycemia management plan.  Reports adherence with medication.  Patient is meeting goals for blood glucose control at this time. No change at time.   Continue Lantus 4 units and 1-2 units prior to meals. Written patient instructions provided.  Follow up in Pharmacist Clinic Visit in 6 months..   Total time in face to face counseling  30 minutes.  Patient seen with Wyonia Hough, MD student -3. Marland Kitchen

## 2013-01-24 NOTE — Progress Notes (Signed)
Lipid-1sst Alicia [name]

## 2013-01-24 NOTE — Patient Instructions (Addendum)
No  Changes today.  Next Pharmacist visit end of the year or early next year.   Keep up the great work that you are doing!!!   You got an AA + from me today

## 2013-01-24 NOTE — Progress Notes (Signed)
S:    Patient arrives with interpreter [name].    She presents to the clinic for diabetes follow up.   O:  Home CBGs: < 200 routinely.   A/P: Diabetes longstanding currently with . Lab Results  Component Value Date   HGBA1C 7.1 12/27/2012   AND home fasting CBG readings of 100-200 and 2 hour post-prandial/random CBG readings of 100-250 (majority of readings < 200).  Denies hypoglycemic events and is able to verbalize appropriate hypoglycemia management plan.  Reports adherence with medication.  Patient is meeting goals for blood glucose control at this time. No change at time.   Continue Lantus 4 units and 1-2 units prior to meals. Written patient instructions provided.  Follow up in Pharmacist Clinic Visit in 6 months..   Total time in face to face counseling  30 minutes.  Patient seen with Wyonia Hough, MD student -3. Marland Kitchen

## 2013-01-27 NOTE — Progress Notes (Signed)
Patient ID: Alicia [name], female   DOB: Aug 13, 1955, 57 y.o.   MRN: [id number] Reviewed:  Agree with Dr. Macky Lower documentation and management.

## 2013-02-14 ENCOUNTER — Other Ambulatory Visit: Payer: Self-pay | Admitting: Family Medicine

## 2013-03-24 ENCOUNTER — Ambulatory Visit: Payer: No Typology Code available for payment source | Admitting: Family Medicine

## 2013-04-01 ENCOUNTER — Encounter: Payer: Self-pay | Admitting: Family Medicine

## 2013-04-01 ENCOUNTER — Ambulatory Visit (INDEPENDENT_AMBULATORY_CARE_PROVIDER_SITE_OTHER): Payer: No Typology Code available for payment source | Admitting: Family Medicine

## 2013-04-01 VITALS — BP 120/54 | HR 78 | Temp 97.9°F | Ht 63.0 in | Wt 143.0 lb

## 2013-04-01 DIAGNOSIS — I10 Essential (primary) hypertension: Secondary | ICD-10-CM

## 2013-04-01 DIAGNOSIS — E1149 Type 2 diabetes mellitus with other diabetic neurological complication: Secondary | ICD-10-CM

## 2013-04-01 DIAGNOSIS — E7800 Pure hypercholesterolemia, unspecified: Secondary | ICD-10-CM

## 2013-04-01 DIAGNOSIS — E1165 Type 2 diabetes mellitus with hyperglycemia: Secondary | ICD-10-CM

## 2013-04-01 DIAGNOSIS — N289 Disorder of kidney and ureter, unspecified: Secondary | ICD-10-CM

## 2013-04-01 DIAGNOSIS — Z23 Encounter for immunization: Secondary | ICD-10-CM

## 2013-04-01 LAB — POCT GLYCOSYLATED HEMOGLOBIN (HGB A1C): Hemoglobin A1C: 7.7

## 2013-04-01 NOTE — Assessment & Plan Note (Signed)
No hypoglycemic episodes and A1c at 7.7. Pt in favor of staying on current management regimen. Stressed importance of moderating diet. Will recheck A1c at follow up.

## 2013-04-01 NOTE — Assessment & Plan Note (Signed)
BPs consistently low/normal in office and at home since metoprolol dose decreased at last visit. Discontinuing amlodipine and will review BP records at next visit. No symptoms of high or low blood pressure at this time.

## 2013-04-01 NOTE — Patient Instructions (Addendum)
Thank you for coming in today! As always, it was a pleasure.   You are doing a GREAT JOB with your medical conditions.  STOP TAKING amlodipine (norvasc) 10mg . TAKE gabapentine 300mg  1 pill once in the morning, 1 pill once in the afternoon, and 2 pills at bedtime.   As you leave, make an appointment to follow up with me in 3 months.  - Bring a log of your blood pressures and blood sugar readings for [name] to review.  Take care and seek immediate care sooner than 3 months if you develop any concerns.  Please feel free to call with any questions or concerns at any time, at [phone]. - Dr. Jarvis Newcomer

## 2013-04-01 NOTE — Progress Notes (Signed)
Patient ID: Alicia [name], female   DOB: [birth year], 57 y.o.   MRN: [id number] Subjective:  HPI:   Alicia [name] is a 57 y.o. female with h/o IDDM, HTN, hyperlipidemia here for follow up.  DIABETES  Disease Monitoring: Lantus 4 U qHS, Novolog 1-3U TIDAC Blood Sugar ranges- 60-200 fasting AM  Reports one episode of 400 fasting BG during an acute illness.   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  Last ophthalmology appt: 08/20/12 Last foot exam: 07/01/13  Medications: losartan, HCTZ, metoprolol, amlodipine Compliance- good Hypoglycemic symptoms- none  HYPERTENSION  Disease Monitoring: Blood pressure range- 110s-120s / 60s Chest pain- none      Dyspnea- none  Medications: Compliance- good Lightheadedness- none Edema- none  HYPERLIPIDEMIA On crestor 10mg , no myalgias.  Lab Results  Component Value Date   CHOL 153 01/24/2013   HDL 75 01/24/2013   LDLCALC 71 01/24/2013   LDLDIRECT 124* 07/21/2011   TRIG 33 01/24/2013   CHOLHDL 2.0 01/24/2013   ROS See HPI above   PMH Smoking Status noted   Review of Systems:  Per HPI. All other systems reviewed and are negative.    Objective:  Physical Exam: BP 120/54  Pulse 78  Temp(Src) 97.9 F (36.6 C) (Oral)  Ht 5\' 3"  (1.6 m)  Wt 143 lb (64.864 kg)  BMI 25.34 kg/m2  Gen: 57 y.o. female in NAD HEENT: MMM, EOMI, PERRL CV: RRR, no MRG Resp: Non-labored, CTAB, no wheezes noted Abd: Soft, NTND, BS present, no guarding or organomegaly MSK: No edema noted, full ROM Neuro: Alert and oriented, CN II-XII grossly intact, speech normal, sensation intact to light touch on feet bilaterally.     Assessment:     Alicia [name] is a 57 y.o. female with h/o insulin-dependent diabetes mellitus, HTN,  hyperlipidemia here for follow up.    Plan:     See problem list for problem-specific plans.

## 2013-04-01 NOTE — Assessment & Plan Note (Signed)
Well controlled with neurontin, though medication side effects (somnolence) indicate dosage reduction. Changed to 300mg  one tablet once upon waking, one tablet once in the afternoon, and 2 tablets at bedtime. Total dose: 1200mg .

## 2013-04-01 NOTE — Assessment & Plan Note (Signed)
1.5 cm simple/Bosniak I upper to mid left renal cyst (tiny 3 mm additional capsular left renal cyst) on dedicated imaging March 2012. No follow up was recommended.

## 2013-04-01 NOTE — Assessment & Plan Note (Signed)
LDL 124 but HDL 75 on 10mg  crestor. No change in medication at this time. No myalgias.

## 2013-04-03 ENCOUNTER — Other Ambulatory Visit: Payer: Self-pay | Admitting: Family Medicine

## 2013-04-27 ENCOUNTER — Other Ambulatory Visit: Payer: Self-pay | Admitting: Family Medicine

## 2013-04-29 ENCOUNTER — Telehealth: Payer: Self-pay | Admitting: *Deleted

## 2013-04-29 DIAGNOSIS — E1165 Type 2 diabetes mellitus with hyperglycemia: Secondary | ICD-10-CM

## 2013-04-29 MED ORDER — INSULIN ASPART 100 UNIT/ML ~~LOC~~ SOLN: 1.0000 [IU] | Freq: Three times a day (TID) | SUBCUTANEOUS | Status: DC

## 2013-04-29 NOTE — Telephone Encounter (Signed)
Patient called out office via interpreter.  Novolog vial is "cracked" and leaked out on the bottom.  Was able to give injection last night, but now vial is empty.  Checked CBG this morning--120.  Does not have any insulin to give morning dose.  Patient has to go to work and wants to know if sample is available until she can go to pharmacy later today.  Novolog #1 vial given (lot # [id number]. 08/2014).  Refill request routed to Dr. Jarvis Newcomer.  Gaylene Brooks, RN

## 2013-04-29 NOTE — Telephone Encounter (Signed)
Refilled novolog electronically.

## 2013-05-16 ENCOUNTER — Telehealth: Payer: Self-pay | Admitting: Family Medicine

## 2013-05-16 ENCOUNTER — Other Ambulatory Visit: Payer: Self-pay | Admitting: Family Medicine

## 2013-05-16 MED ORDER — INSULIN GLARGINE 100 UNIT/ML ~~LOC~~ SOLN: 4.0000 [IU] | Freq: Every day | SUBCUTANEOUS | Status: DC

## 2013-05-16 NOTE — Telephone Encounter (Signed)
Pt called and needs a refill on her Lantus sent to the [facility] on [address]. jw

## 2013-06-19 ENCOUNTER — Telehealth: Payer: Self-pay | Admitting: Family Medicine

## 2013-06-19 NOTE — Telephone Encounter (Signed)
[phone] is the phone number for the company  The name of the company starts with a C

## 2013-06-19 NOTE — Telephone Encounter (Signed)
Deaf: Core  Company that supplies her diabetic supplies says they have not received anything from [name]. She said the company faxed [name]  Last Thursday. She will be out in a few days

## 2013-06-20 ENCOUNTER — Other Ambulatory Visit: Payer: Self-pay | Admitting: Family Medicine

## 2013-07-02 ENCOUNTER — Other Ambulatory Visit: Payer: Self-pay | Admitting: Family Medicine

## 2013-07-02 NOTE — Telephone Encounter (Signed)
Prescription was sent electronically.   Ryan B. Jarvis Newcomer, MD, PGY-1 07/02/2013 3:56 PM

## 2013-07-07 ENCOUNTER — Ambulatory Visit (INDEPENDENT_AMBULATORY_CARE_PROVIDER_SITE_OTHER): Payer: Medicare Other | Admitting: Family Medicine

## 2013-07-07 ENCOUNTER — Encounter: Payer: Self-pay | Admitting: Family Medicine

## 2013-07-07 VITALS — BP 137/69 | HR 90 | Temp 99.1°F | Ht 63.0 in | Wt 151.0 lb

## 2013-07-07 DIAGNOSIS — R5383 Other fatigue: Secondary | ICD-10-CM | POA: Diagnosis not present

## 2013-07-07 DIAGNOSIS — E7800 Pure hypercholesterolemia, unspecified: Secondary | ICD-10-CM | POA: Diagnosis not present

## 2013-07-07 DIAGNOSIS — S2239XA Fracture of one rib, unspecified side, initial encounter for closed fracture: Secondary | ICD-10-CM | POA: Diagnosis not present

## 2013-07-07 DIAGNOSIS — IMO0001 Reserved for inherently not codable concepts without codable children: Secondary | ICD-10-CM | POA: Diagnosis present

## 2013-07-07 DIAGNOSIS — M79609 Pain in unspecified limb: Secondary | ICD-10-CM | POA: Diagnosis not present

## 2013-07-07 DIAGNOSIS — E1165 Type 2 diabetes mellitus with hyperglycemia: Secondary | ICD-10-CM | POA: Diagnosis not present

## 2013-07-07 DIAGNOSIS — K3184 Gastroparesis: Secondary | ICD-10-CM | POA: Diagnosis not present

## 2013-07-07 DIAGNOSIS — J069 Acute upper respiratory infection, unspecified: Secondary | ICD-10-CM | POA: Diagnosis not present

## 2013-07-07 DIAGNOSIS — M19049 Primary osteoarthritis, unspecified hand: Secondary | ICD-10-CM | POA: Diagnosis not present

## 2013-07-07 DIAGNOSIS — E1139 Type 2 diabetes mellitus with other diabetic ophthalmic complication: Secondary | ICD-10-CM | POA: Diagnosis not present

## 2013-07-07 DIAGNOSIS — Z23 Encounter for immunization: Secondary | ICD-10-CM | POA: Diagnosis not present

## 2013-07-07 DIAGNOSIS — N289 Disorder of kidney and ureter, unspecified: Secondary | ICD-10-CM | POA: Diagnosis not present

## 2013-07-07 DIAGNOSIS — E1149 Type 2 diabetes mellitus with other diabetic neurological complication: Secondary | ICD-10-CM | POA: Diagnosis not present

## 2013-07-07 DIAGNOSIS — E11319 Type 2 diabetes mellitus with unspecified diabetic retinopathy without macular edema: Secondary | ICD-10-CM | POA: Diagnosis not present

## 2013-07-07 DIAGNOSIS — I10 Essential (primary) hypertension: Secondary | ICD-10-CM | POA: Diagnosis not present

## 2013-07-07 DIAGNOSIS — G5600 Carpal tunnel syndrome, unspecified upper limb: Secondary | ICD-10-CM | POA: Diagnosis not present

## 2013-07-07 DIAGNOSIS — E119 Type 2 diabetes mellitus without complications: Secondary | ICD-10-CM | POA: Diagnosis not present

## 2013-07-07 DIAGNOSIS — L600 Ingrowing nail: Secondary | ICD-10-CM | POA: Diagnosis not present

## 2013-07-07 DIAGNOSIS — J029 Acute pharyngitis, unspecified: Secondary | ICD-10-CM | POA: Diagnosis not present

## 2013-07-07 DIAGNOSIS — M6530 Trigger finger, unspecified finger: Secondary | ICD-10-CM | POA: Diagnosis not present

## 2013-07-07 DIAGNOSIS — R5381 Other malaise: Secondary | ICD-10-CM | POA: Diagnosis not present

## 2013-07-07 DIAGNOSIS — IMO0002 Reserved for concepts with insufficient information to code with codable children: Secondary | ICD-10-CM | POA: Diagnosis not present

## 2013-07-07 NOTE — Assessment & Plan Note (Signed)
Patient's illness a likely viral in nature.  History suggests possible flu, but patient is now outside the treatment window it is improving.  Advised use of over-the-counter supplements to help her symptoms.  Advised Motrin as needed.  Patient to followup if she fails to improve.

## 2013-07-07 NOTE — Patient Instructions (Signed)
It was nice to see you today.  Use OTC remedies and Ibuprofen for your symptoms if needed.   If you fail to improve this week please return.    Follow up with your PCP as needed.

## 2013-07-07 NOTE — Progress Notes (Signed)
Subjective:     Patient ID: Alicia [name], female   DOB: Apr 01, 1956, 58 y.o.   MRN: [id number]  HPI 58 year old female presents for evaluation of sneezing, runny nose and congestion.  She reports that last week (Tuesday) she developed sneezing, runny nose and congestion.  She also had diffuse myalgias and tactile fever.  She used OTC Robitussin and Flonase for her symptoms with little relief.  Fever resolved, but she continues to have some body aches as well as sneezing, runny nose and congestion.    She denies any shortness of breath, chest pain. She does note some associated sore throat.  No purulent nasal discharge.  Review of Systems Per HPI    Objective:   Physical Exam Filed Vitals:   07/07/13 1040  BP: 137/69  Pulse: 90  Temp: 99.1 F (37.3 C)  Exam: General: well appearing, NAD. HEENT: NCAT. R TM obscured by cerumen impaction. Left TM normal.  Mild erythema of the oropharynx.  No tonsillar hypertrophy or tonsillar exudate. Cardiovascular: RRR. No murmurs, rubs, or gallops. Respiratory: CTAB. No rales, rhonchi, or wheeze.     Assessment/Plan:  See Problem List

## 2013-07-10 ENCOUNTER — Ambulatory Visit (INDEPENDENT_AMBULATORY_CARE_PROVIDER_SITE_OTHER): Payer: Medicare Other | Admitting: Family Medicine

## 2013-07-10 ENCOUNTER — Encounter: Payer: Self-pay | Admitting: Family Medicine

## 2013-07-10 VITALS — BP 132/60 | HR 83 | Temp 97.9°F | Ht 63.0 in | Wt 151.0 lb

## 2013-07-10 DIAGNOSIS — R5383 Other fatigue: Secondary | ICD-10-CM | POA: Diagnosis not present

## 2013-07-10 DIAGNOSIS — E7800 Pure hypercholesterolemia, unspecified: Secondary | ICD-10-CM | POA: Diagnosis not present

## 2013-07-10 DIAGNOSIS — E119 Type 2 diabetes mellitus without complications: Secondary | ICD-10-CM | POA: Diagnosis not present

## 2013-07-10 DIAGNOSIS — R5381 Other malaise: Secondary | ICD-10-CM

## 2013-07-10 DIAGNOSIS — I10 Essential (primary) hypertension: Secondary | ICD-10-CM

## 2013-07-10 DIAGNOSIS — J029 Acute pharyngitis, unspecified: Secondary | ICD-10-CM | POA: Diagnosis not present

## 2013-07-10 DIAGNOSIS — IMO0001 Reserved for inherently not codable concepts without codable children: Secondary | ICD-10-CM

## 2013-07-10 DIAGNOSIS — J069 Acute upper respiratory infection, unspecified: Secondary | ICD-10-CM

## 2013-07-10 DIAGNOSIS — M791 Myalgia, unspecified site: Secondary | ICD-10-CM

## 2013-07-10 DIAGNOSIS — L600 Ingrowing nail: Secondary | ICD-10-CM | POA: Diagnosis not present

## 2013-07-10 DIAGNOSIS — IMO0002 Reserved for concepts with insufficient information to code with codable children: Secondary | ICD-10-CM | POA: Diagnosis not present

## 2013-07-10 DIAGNOSIS — E1149 Type 2 diabetes mellitus with other diabetic neurological complication: Secondary | ICD-10-CM | POA: Diagnosis not present

## 2013-07-10 DIAGNOSIS — E1165 Type 2 diabetes mellitus with hyperglycemia: Secondary | ICD-10-CM | POA: Diagnosis not present

## 2013-07-10 DIAGNOSIS — S2239XA Fracture of one rib, unspecified side, initial encounter for closed fracture: Secondary | ICD-10-CM | POA: Diagnosis not present

## 2013-07-10 DIAGNOSIS — N289 Disorder of kidney and ureter, unspecified: Secondary | ICD-10-CM | POA: Diagnosis not present

## 2013-07-10 LAB — COMPREHENSIVE METABOLIC PANEL
ALT: 9 U/L (ref 0–35)
AST: 13 U/L (ref 0–37)
Albumin: 4.2 g/dL (ref 3.5–5.2)
Alkaline Phosphatase: 72 U/L (ref 39–117)
BUN: 10 mg/dL (ref 6–23)
CO2: 30 mEq/L (ref 19–32)
Calcium: 10.7 mg/dL — ABNORMAL HIGH (ref 8.4–10.5)
Chloride: 97 mEq/L (ref 96–112)
Creat: 0.53 mg/dL (ref 0.50–1.10)
Glucose, Bld: 166 mg/dL — ABNORMAL HIGH (ref 70–99)
Potassium: 3.7 mEq/L (ref 3.5–5.3)
Sodium: 135 mEq/L (ref 135–145)
Total Bilirubin: 0.3 mg/dL (ref 0.3–1.2)
Total Protein: 7.3 g/dL (ref 6.0–8.3)

## 2013-07-10 LAB — CBC WITH DIFFERENTIAL/PLATELET
Basophils Absolute: 0 10*3/uL (ref 0.0–0.1)
Basophils Relative: 0 % (ref 0–1)
Eosinophils Absolute: 0.3 10*3/uL (ref 0.0–0.7)
Eosinophils Relative: 5 % (ref 0–5)
HCT: 39.1 % (ref 36.0–46.0)
Hemoglobin: 12.9 g/dL (ref 12.0–15.0)
Lymphocytes Relative: 30 % (ref 12–46)
Lymphs Abs: 2.1 10*3/uL (ref 0.7–4.0)
MCH: 30.1 pg (ref 26.0–34.0)
MCHC: 33 g/dL (ref 30.0–36.0)
MCV: 91.4 fL (ref 78.0–100.0)
Monocytes Absolute: 0.4 10*3/uL (ref 0.1–1.0)
Monocytes Relative: 6 % (ref 3–12)
Neutro Abs: 4.1 10*3/uL (ref 1.7–7.7)
Neutrophils Relative %: 59 % (ref 43–77)
Platelets: 330 10*3/uL (ref 150–400)
RBC: 4.28 MIL/uL (ref 3.87–5.11)
RDW: 14 % (ref 11.5–15.5)
WBC: 6.9 10*3/uL (ref 4.0–10.5)

## 2013-07-10 LAB — LDL CHOLESTEROL, DIRECT: Direct LDL: 78 mg/dL

## 2013-07-10 LAB — POCT MONO (EPSTEIN BARR VIRUS): Mono, POC: NEGATIVE

## 2013-07-10 NOTE — Assessment & Plan Note (Signed)
At goal today. Checking CMET, TSH, lipids today as have not been checked for a little over a year.

## 2013-07-10 NOTE — Assessment & Plan Note (Signed)
Likely she is having myalgias from her recent URI, possibly flu-like illness. However I do palpate an enlarged thyroid.   TSH normal in 2013. Has long-standing diabetes -- checking TSH today, also possibly thyroiditis from recent illness but not likely.  Monospot negative here today.   FU in 2 weeks to assess for improvement.  Call tomorrow with results of TSH.

## 2013-07-10 NOTE — Patient Instructions (Addendum)
Your Monospot test was negative.  We are checking your thyroid, as well as some other routine tests.   We will call and let you know those results tomorrow.  In the meantime, take the Diclofenac for relief of your muscle aches.   If you are having worsening symptoms or no improvement with the pain reliever, let me know.   If you start getting worse, of course come back.

## 2013-07-10 NOTE — Progress Notes (Signed)
Subjective:    Alicia [name] is a 58 y.o. female who presents to [facility] today for FU from URI sx's on Monday.  Had what she describes as URI sx's plus cough for about 1 week prior to presenting for care at [facility] on Monday of this week (4 days ago).  Since being seen, no further cough or URI symptoms, only myalgias:  1.  Myalgias:  Present since being seen here at Clinic on Monday.  Describes aching in upper and lower extremities.  No headaches, had these last week.  No fevers but does endorse cold intolerance.  No nausea or vomiting.  No LE swelling.  No changes in weight that she knows of.  No palpitations or chest pain, no dyspnea.   Daughter recently (5 days ago) diagnosed with Mononucleosis.   ROS as above per HPI, otherwise neg.    The following portions of the patient's history were reviewed and updated as appropriate: allergies, current medications, past medical history, family and social history, and problem list. Patient is a nonsmoker.    PMH reviewed.  Past Medical History  Diagnosis Date  . Diabetes mellitus   . Hypertension   . Hyperlipidemia   . Gastroparesis   . Diabetic neuropathy   . GERD (gastroesophageal reflux disease)   . Complete deafness     Meningitis at age 62  . S/P appy   . Deaf   . H/O: C-section   . Ganglion cyst 06/22/2011   Past Surgical History  Procedure Laterality Date  . Appendectomy    . Tubal ligation    . Cesarean section    . Uterine fibroid embolization  2007  . Cardiolyte ef 77%, no ischemia in 2006  2006  . Wrist surgery      Cyst removed on left  . Wrist surgery Right 1985    tendon repair R wrist    Medications reviewed. Current Outpatient Prescriptions  Medication Sig Dispense Refill  . aspirin (BAYER CHILDRENS ASPIRIN) 81 MG chewable tablet Chew 81 mg by mouth daily.        . Calcium Carbonate-Vitamin D (CALCIUM 600/VITAMIN D) 600-400 MG-UNIT per chew tablet Chew 1 tablet by mouth 2 (two) times daily.       . cholecalciferol  (VITAMIN D) 1000 UNITS tablet Take 1,000 Units by mouth daily.      . CRESTOR 10 MG tablet TAKE 1 TABLET BY MOUTH EVERY DAY  30 tablet  5  . cyanocobalamin 500 MCG tablet Take 500 mcg by mouth daily.      . ferrous sulfate 325 (65 FE) MG tablet Take 1 tablet (325 mg total) by mouth daily with breakfast.    3  . fluticasone (FLONASE) 50 MCG/ACT nasal spray 2 sprays to each nostril daily. Use nasal saline before use.  16 g  11  . gabapentin (NEURONTIN) 300 MG capsule Take 300 mg by mouth 3 (three) times daily. Take 1 cap twice daily and 2 caps at bedtime.      . gabapentin (NEURONTIN) 300 MG capsule TAKE ONE CAPSULE BY MOUTH 4 TIMES A DAY  120 capsule  1  . hydrochlorothiazide (HYDRODIURIL) 25 MG tablet TAKE 1/2 TABLET (12.5 MG TOTAL) BY MOUTH EVERY DAY  30 tablet  5  . ibuprofen (ADVIL,MOTRIN) 600 MG tablet Take 600 mg by mouth every 8 (eight) hours as needed for pain (rib fracture pain).      . insulin aspart (NOVOLOG) 100 UNIT/ML injection Inject 1-3 Units into the skin 3 (  three) times daily before meals.  1 vial  0  . insulin glargine (LANTUS) 100 UNIT/ML injection Inject 0.04 mLs (4 Units total) into the skin at bedtime.  10 mL  5  . LANTUS 100 UNIT/ML injection INJECT 6 UNITS SUBCUTANEOUSLY AT BEDTIME  10 mL  3  . losartan (COZAAR) 100 MG tablet TAKE 1 TABLET BY MOUTH EVERY DAY  90 tablet  3  . metFORMIN (GLUCOPHAGE) 1000 MG tablet TAKE 1 TABLET BY MOUTH TWO TIMES DAILY.  60 tablet  5  . metoprolol succinate (TOPROL-XL) 50 MG 24 hr tablet TAKE 1 TABLET BY MOUTH EVERY DAY *TAKE WITH OR IMMEDIATELY FOLLOWING A MEAL  90 tablet  3  . NOVOLOG 100 UNIT/ML injection TAKE AS DIRECTED PER GLUCOSE: 150-200 GIVE 2UNITS, IF 201-250 GIVE 3UNITS >250 GIVE 4 UNITS  10 mL  2  . polyethylene glycol powder (GLYCOLAX/MIRALAX) powder Take 17 g by mouth 3 (three) times a week.  527 g  4  . traMADol (ULTRAM) 50 MG tablet Take 1 tablet (50 mg total) by mouth every 8 (eight) hours as needed for pain (as needed for rib  pain).  15 tablet  0   No current facility-administered medications for this visit.     Objective:   Physical Exam BP 132/60  Pulse 83  Temp(Src) 97.9 F (36.6 C) (Oral)  Ht 5\' 3"  (1.6 m)  Wt 151 lb (68.493 kg)  BMI 26.76 kg/m2 Gen:  Patient sitting on exam table, appears stated age in no acute distress Head: Normocephalic atraumatic Eyes: EOMI, PERRL, sclera and conjunctiva non-erythematous Ears:  Canals clear bilaterally.  TMs pearly gray bilaterally without erythema or bulging.   Nose:  Nasal turbinates minimally edematous.  No exudates. Mouth: Mucosa membranes moist. Tonsils +2, nonenlarged, non-erythematous. Neck: No cervical lymphadenopathy noted. I do note an symmetrically enlarged thyromegaly present without nodules.  No overlying redness/rash. Heart:  RRR. Pulm:  Clear to auscultation bilaterally with good air movement.  No wheezes or rales noted.   Ext: No edema BL LE's    No results found for this or any previous visit (from the past 72 hour(s)).

## 2013-07-10 NOTE — Assessment & Plan Note (Signed)
Symptoms have greatly improved, see myalgias.

## 2013-07-11 ENCOUNTER — Telehealth: Payer: Self-pay | Admitting: Family Medicine

## 2013-07-11 LAB — TSH: TSH: 1.196 u[IU]/mL (ref 0.350–4.500)

## 2013-07-11 MED ORDER — DICLOFENAC SODIUM 75 MG PO TBEC: 75.0000 mg | DELAYED_RELEASE_TABLET | Freq: Two times a day (BID) | ORAL | Status: DC

## 2013-07-11 NOTE — Telephone Encounter (Signed)
Left message through sign language interpreter answering machine:  Patient's labs are all WNL, specifially thyroid.  This is good news.  She should take Diclofenac for relief. It doesn't look like this was sent in, I will do that know.  Patient to call with any questions.

## 2013-07-17 ENCOUNTER — Ambulatory Visit: Payer: No Typology Code available for payment source | Admitting: Pharmacist

## 2013-07-23 ENCOUNTER — Other Ambulatory Visit: Payer: Self-pay | Admitting: *Deleted

## 2013-07-23 MED ORDER — ROSUVASTATIN CALCIUM 10 MG PO TABS: ORAL_TABLET | ORAL | Status: DC

## 2013-07-23 MED ORDER — GABAPENTIN 300 MG PO CAPS: 300.0000 mg | ORAL_CAPSULE | Freq: Three times a day (TID) | ORAL | Status: DC

## 2013-07-23 NOTE — Telephone Encounter (Signed)
Pt called requesting Rx refill on Crestor 10 mg and Neurontin 300mg . Will route request to PCP. Derl Barrow, RN

## 2013-08-21 ENCOUNTER — Other Ambulatory Visit: Payer: Self-pay | Admitting: *Deleted

## 2013-08-21 ENCOUNTER — Other Ambulatory Visit: Payer: Self-pay | Admitting: Family Medicine

## 2013-08-21 DIAGNOSIS — E7800 Pure hypercholesterolemia, unspecified: Secondary | ICD-10-CM

## 2013-08-21 MED ORDER — ROSUVASTATIN CALCIUM 10 MG PO TABS: ORAL_TABLET | ORAL | Status: DC

## 2013-08-21 NOTE — Telephone Encounter (Signed)
6 months worth of crestor 10mg  ordered.

## 2013-09-09 ENCOUNTER — Ambulatory Visit: Payer: No Typology Code available for payment source | Admitting: Family Medicine

## 2013-09-12 ENCOUNTER — Other Ambulatory Visit: Payer: Self-pay | Admitting: Family Medicine

## 2013-09-19 ENCOUNTER — Ambulatory Visit (INDEPENDENT_AMBULATORY_CARE_PROVIDER_SITE_OTHER): Payer: Medicare Other | Admitting: Family Medicine

## 2013-09-19 ENCOUNTER — Encounter: Payer: Self-pay | Admitting: Family Medicine

## 2013-09-19 VITALS — BP 155/82 | HR 86 | Temp 97.8°F | Wt 155.1 lb

## 2013-09-19 DIAGNOSIS — E1149 Type 2 diabetes mellitus with other diabetic neurological complication: Secondary | ICD-10-CM

## 2013-09-19 DIAGNOSIS — L600 Ingrowing nail: Secondary | ICD-10-CM | POA: Diagnosis not present

## 2013-09-19 DIAGNOSIS — IMO0001 Reserved for inherently not codable concepts without codable children: Secondary | ICD-10-CM | POA: Diagnosis not present

## 2013-09-19 DIAGNOSIS — J069 Acute upper respiratory infection, unspecified: Secondary | ICD-10-CM

## 2013-09-19 DIAGNOSIS — E11319 Type 2 diabetes mellitus with unspecified diabetic retinopathy without macular edema: Secondary | ICD-10-CM

## 2013-09-19 DIAGNOSIS — S2239XA Fracture of one rib, unspecified side, initial encounter for closed fracture: Secondary | ICD-10-CM | POA: Diagnosis not present

## 2013-09-19 DIAGNOSIS — E7800 Pure hypercholesterolemia, unspecified: Secondary | ICD-10-CM | POA: Diagnosis not present

## 2013-09-19 DIAGNOSIS — IMO0002 Reserved for concepts with insufficient information to code with codable children: Secondary | ICD-10-CM

## 2013-09-19 DIAGNOSIS — Z23 Encounter for immunization: Secondary | ICD-10-CM | POA: Diagnosis not present

## 2013-09-19 DIAGNOSIS — N289 Disorder of kidney and ureter, unspecified: Secondary | ICD-10-CM | POA: Diagnosis not present

## 2013-09-19 DIAGNOSIS — I10 Essential (primary) hypertension: Secondary | ICD-10-CM | POA: Diagnosis not present

## 2013-09-19 DIAGNOSIS — E1165 Type 2 diabetes mellitus with hyperglycemia: Secondary | ICD-10-CM

## 2013-09-19 DIAGNOSIS — E1139 Type 2 diabetes mellitus with other diabetic ophthalmic complication: Secondary | ICD-10-CM

## 2013-09-19 DIAGNOSIS — E118 Type 2 diabetes mellitus with unspecified complications: Principal | ICD-10-CM

## 2013-09-19 LAB — POCT GLYCOSYLATED HEMOGLOBIN (HGB A1C): Hemoglobin A1C: 7.6

## 2013-09-19 MED ORDER — PSEUDOEPHEDRINE HCL 30 MG PO TABS: 30.0000 mg | ORAL_TABLET | Freq: Three times a day (TID) | ORAL | Status: DC | PRN

## 2013-09-19 NOTE — Assessment & Plan Note (Signed)
LDL 78 at last check on crestor. Continue statin.

## 2013-09-19 NOTE — Progress Notes (Signed)
Patient ID: MALAYASIA MIRKIN, female   DOB: [birth year], 58 y.o.   MRN: [id number]   Subjective:  HPI:   RANYAH GROENEVELD is a 58 y.o. female with a history of IDDM, HTN, HLD here for follow-up.  HYPERTENSION Disease Monitoring Blood pressure range-unknown Chest pain- None      Dyspnea- None Medications: Metoprolol 50mg , HCTZ 25mg , Losartan 100mg  Compliance- Good Lightheadedness- None   Edema- None  DIABETES With neuropathy, gastroparesis Blood Sugar ranges- 85-160 Polyuria- None New Visual problems- None Medications: Metformin 1000mg , Lantus 4u qHS, Novolog 1-3u qTIDAC, ASA, neurontin (300,300,600mg ) Compliance- Good Hypoglycemic symptoms- None Due for foot exam and ophtho exam.  HYPERLIPIDEMIA Medications: Crestor 10mg  Compliance- Good RUQ pain- None  Muscle aches- None  She has also had bilateral ear pain associated with some congestion. No sore throat or cough. No tinnitus, and pt is already deaf.   Also takes Vit D, Ca, B12, Fe Non-smoker She is up 12lbs from September 2014 due to stress surrounding a divorce.   Review of Systems:  Per HPI. All other systems reviewed and are negative.  Objective:  Physical Exam: There were no vitals taken for this visit.  Gen: Pleasant 58 y.o. female in NAD HEENT: MMM, EOMI, PERRL, anicteric sclerae. Normal oropharynx. TMs not visible due to cerumen impaction bilaterally. No drainage.  CV: RRR, no MRG, no JVD Resp: Non-labored, CTAB, no wheezes noted Abd: Soft, NTND, BS present, no guarding or organomegaly MSK: No edema noted, full ROM Neuro: Alert and oriented, speech normal   See foot exam.   Assessment:     ALILAH MCMEANS is a 58 y.o. female here for follow up.     Plan:     See problem list for problem-specific plans.

## 2013-09-19 NOTE — Patient Instructions (Signed)
Thank you for coming in today!  We couldn't quite get your ears clean, but I think a decongestant will help your ears drain better and I'd also like you to use hydrogen peroxide in your ears every night for 2 weeks.   Your Hemoglobin A1c is 7.6 (stable from last check at 7.7) and because you've had some hypoglycemic symptoms, I think we should not change your insulin.  I have sent refills for HCTZ to your pharmacy.   As you leave, make an appointment to follow up with me in 3 months.   Take care and seek immediate care sooner if you develop any concerns.  Please feel free to call with any questions or concerns at any time, at [phone]. - Dr. Bonner Puna

## 2013-09-19 NOTE — Assessment & Plan Note (Addendum)
Systolic above goal, but pt out of HCTZ x1wk. Previously at goal, so will not make adjustments. Refilled HCTZ, refills current on everything else. CMP, TSH, lipids ok recently.

## 2013-09-19 NOTE — Assessment & Plan Note (Signed)
Continue neurontin regimen. Foot exam wnl today.

## 2013-09-19 NOTE — Assessment & Plan Note (Signed)
- 

## 2013-09-19 NOTE — Assessment & Plan Note (Signed)
Rx decongestant to improve eustachian tube dysfxn. No visualization of TMs due to cerumen impaction, and no systemic signs of infection - so no abx. Unsuccessful attempt at irrigation. Rec hydrogen peroxide daily to loosen cerumen and will re-attempt at next visit.

## 2013-09-19 NOTE — Assessment & Plan Note (Addendum)
A1c 7.6. Continue current management, as pt reports occasional low CBGs at home. Continue ASA, statin, ARB. F/u 3 months.

## 2013-09-23 ENCOUNTER — Other Ambulatory Visit: Payer: Self-pay | Admitting: Family Medicine

## 2013-09-23 DIAGNOSIS — Z1231 Encounter for screening mammogram for malignant neoplasm of breast: Secondary | ICD-10-CM

## 2013-09-26 ENCOUNTER — Telehealth: Payer: Self-pay | Admitting: Family Medicine

## 2013-09-26 MED ORDER — GABAPENTIN 300 MG PO CAPS: 300.0000 mg | ORAL_CAPSULE | Freq: Three times a day (TID) | ORAL | Status: DC

## 2013-09-26 NOTE — Telephone Encounter (Signed)
Pt called and has been waiting for two days now for her refill request on Gabapentin. Please send today since she has been out for two days now. jw

## 2013-09-26 NOTE — Telephone Encounter (Signed)
This has been refilled.

## 2013-10-10 ENCOUNTER — Ambulatory Visit (INDEPENDENT_AMBULATORY_CARE_PROVIDER_SITE_OTHER): Payer: Medicare Other | Admitting: Family Medicine

## 2013-10-10 ENCOUNTER — Encounter: Payer: Self-pay | Admitting: Family Medicine

## 2013-10-10 VITALS — BP 124/73 | HR 79 | Temp 98.6°F | Ht 63.0 in | Wt 145.0 lb

## 2013-10-10 DIAGNOSIS — R5383 Other fatigue: Secondary | ICD-10-CM | POA: Diagnosis not present

## 2013-10-10 DIAGNOSIS — I10 Essential (primary) hypertension: Secondary | ICD-10-CM | POA: Diagnosis not present

## 2013-10-10 DIAGNOSIS — E7800 Pure hypercholesterolemia, unspecified: Secondary | ICD-10-CM | POA: Diagnosis not present

## 2013-10-10 DIAGNOSIS — L600 Ingrowing nail: Secondary | ICD-10-CM | POA: Diagnosis not present

## 2013-10-10 DIAGNOSIS — IMO0002 Reserved for concepts with insufficient information to code with codable children: Secondary | ICD-10-CM | POA: Diagnosis not present

## 2013-10-10 DIAGNOSIS — IMO0001 Reserved for inherently not codable concepts without codable children: Secondary | ICD-10-CM | POA: Diagnosis not present

## 2013-10-10 DIAGNOSIS — A084 Viral intestinal infection, unspecified: Secondary | ICD-10-CM

## 2013-10-10 DIAGNOSIS — J069 Acute upper respiratory infection, unspecified: Secondary | ICD-10-CM | POA: Diagnosis not present

## 2013-10-10 DIAGNOSIS — E11319 Type 2 diabetes mellitus with unspecified diabetic retinopathy without macular edema: Secondary | ICD-10-CM | POA: Diagnosis not present

## 2013-10-10 DIAGNOSIS — A088 Other specified intestinal infections: Secondary | ICD-10-CM

## 2013-10-10 DIAGNOSIS — E1139 Type 2 diabetes mellitus with other diabetic ophthalmic complication: Secondary | ICD-10-CM | POA: Diagnosis not present

## 2013-10-10 DIAGNOSIS — R5381 Other malaise: Secondary | ICD-10-CM | POA: Diagnosis not present

## 2013-10-10 DIAGNOSIS — J029 Acute pharyngitis, unspecified: Secondary | ICD-10-CM | POA: Diagnosis not present

## 2013-10-10 DIAGNOSIS — E119 Type 2 diabetes mellitus without complications: Secondary | ICD-10-CM | POA: Diagnosis not present

## 2013-10-10 DIAGNOSIS — E1165 Type 2 diabetes mellitus with hyperglycemia: Secondary | ICD-10-CM | POA: Diagnosis not present

## 2013-10-10 DIAGNOSIS — N289 Disorder of kidney and ureter, unspecified: Secondary | ICD-10-CM | POA: Diagnosis not present

## 2013-10-10 DIAGNOSIS — S2239XA Fracture of one rib, unspecified side, initial encounter for closed fracture: Secondary | ICD-10-CM | POA: Diagnosis not present

## 2013-10-10 DIAGNOSIS — E1149 Type 2 diabetes mellitus with other diabetic neurological complication: Secondary | ICD-10-CM | POA: Diagnosis not present

## 2013-10-10 NOTE — Patient Instructions (Addendum)
Norovirus Infection Norovirus illness is caused by a viral infection. The term norovirus refers to a group of viruses. Any of those viruses can cause norovirus illness. This illness is often referred to by other names such as viral gastroenteritis, stomach flu, and food poisoning. Anyone can get a norovirus infection. People can have the illness multiple times during their lifetime. CAUSES  Norovirus is found in the stool or vomit of infected people. It is easily spread from person to person (contagious). People with norovirus are contagious from the moment they begin feeling ill. They may remain contagious for as long as 3 days to 2 weeks after recovery. People can become infected with the virus in several ways. This includes:  Eating food or drinking liquids that are contaminated with norovirus.  Touching surfaces or objects contaminated with norovirus, and then placing your hand in your mouth.  Having direct contact with a person who is infected and shows symptoms. This may occur while caring for someone with illness or while sharing foods or eating utensils with someone who is ill. SYMPTOMS  Symptoms usually begin 1 to 2 days after ingestion of the virus. Symptoms may include:  Nausea.  Vomiting.  Diarrhea.  Stomach cramps.  Low-grade fever.  Chills.  Headache.  Muscle aches.  Tiredness. Most people with norovirus illness get better within 2 to 3 days. Some people become dehydrated because they cannot drink enough liquids to replace those lost from vomiting and diarrhea. This is especially true for young children, the elderly, and others who are unable to care for themselves. DIAGNOSIS  Diagnosis is based on your symptoms and exam. Currently, only state public health laboratories have the ability to test for norovirus in stool or vomit. TREATMENT  No specific treatment exists for norovirus infections. No vaccine is available to prevent infections. Norovirus illness is usually  brief in healthy people. If you are ill with vomiting and diarrhea, you should drink enough water and fluids to keep your urine clear or pale yellow. Dehydration is the most serious health effect that can result from this infection. By drinking oral rehydration solution (ORS), people can reduce their chance of becoming dehydrated. There are many commercially available pre-made and powdered ORS designed to safely rehydrate people. These may be recommended by your caregiver. Replace any new fluid losses from diarrhea or vomiting with ORS as follows:  If your child weighs 10 kg or less (22 lb or less), give 60 to 120 ml ( to  cup or 2 to 4 oz) of ORS for each diarrheal stool or vomiting episode.  If your child weighs more than 10 kg (more than 22 lb), give 120 to 240 ml ( to 1 cup or 4 to 8 oz) of ORS for each diarrheal stool or vomiting episode. HOME CARE INSTRUCTIONS   Follow all your caregiver's instructions.  Avoid sugar-free and alcoholic drinks while ill.  Only take over-the-counter or prescription medicines for pain, vomiting, diarrhea, or fever as directed by your caregiver. You can decrease your chances of coming in contact with norovirus or spreading it by following these steps:  Frequently wash your hands, especially after using the toilet, changing diapers, and before eating or preparing food.  Carefully wash fruits and vegetables. Cook shellfish before eating them.  Do not prepare food for others while you are infected and for at least 3 days after recovering from illness.  Thoroughly clean and disinfect contaminated surfaces immediately after an episode of illness using a bleach-based household cleaner.  Immediately remove and wash clothing or linens that may be contaminated with the virus.  Use the toilet to dispose of any vomit or stool. Make sure the surrounding area is kept clean.  Food that may have been contaminated by an ill person should be discarded. SEEK IMMEDIATE  MEDICAL CARE IF:   You develop symptoms of dehydration that do not improve with fluid replacement. This may include:  Excessive sleepiness.  Lack of tears.  Dry mouth.  Dizziness when standing.  Weak pulse. Document Released: 09/09/2002 Document Revised: 09/11/2011 Document Reviewed: 10/11/2009 [facility] Patient Information 2014 [place], [place].  Health Maintenance Due  Topic Date Due  . Foot Exam  07/01/2013  . Ophthalmology Exam - may 13th 08/20/2013

## 2013-10-11 NOTE — Progress Notes (Signed)
Garret Reddish, MD Phone: [phone]  Subjective:   Alicia [name] is a 58 y.o. 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 2 days ago, patient developed nausea and diarhea which progressed to vomiting and diarrhea on multiple occasions overnight. She was unable to sleep due to this. She cannot remember the # of times she had diarrhea or emesiss. Denies mucus or blood in stool. Denies blood in emesis. States around next morning symptoms began to become less frequent. She was concerned because despite not eating, blood sugars were remaining from 200-250 so decided to come in today. She took an extra 1 unit of Lantus yesterday but did not take novolog.  Overall, symptoms are mildly improving and sugar this morning is now less than 200 again. She has been able to drink fluids for the most part but has not tolerated foods well. No emesis yet today, but did have 2 episodes of diarrhea again.  ROS- subjective fevers but none measured, occasional chill, mild fatigue, mild cramping abdominal pain and body aches  Past Medical History- deaf (presents with interpreter), DM with retinopathy, hyperlipidemia and HTN, seasonal allergies  Medications- reviewed and updated Current Outpatient Prescriptions  Medication Sig Dispense Refill  . aspirin (BAYER CHILDRENS ASPIRIN) 81 MG chewable tablet Chew 81 mg by mouth daily.        . Calcium Carbonate-Vitamin D (CALCIUM 600/VITAMIN D) 600-400 MG-UNIT per chew tablet Chew 1 tablet by mouth 2 (two) times daily.       . cholecalciferol (VITAMIN D) 1000 UNITS tablet Take 1,000 Units by mouth daily.      . cyanocobalamin 500 MCG tablet Take 500 mcg by mouth daily.      . ferrous sulfate 325 (65 FE) MG tablet Take 1 tablet (325 mg total) by mouth daily with breakfast.    3  . fluticasone (FLONASE) 50 MCG/ACT nasal spray 2 sprays to  each nostril daily. Use nasal saline before use.  16 g  11  . gabapentin (NEURONTIN) 300 MG capsule Take 1 capsule (300 mg total) by mouth 3 (three) times daily. Take 1 cap twice daily and 2 caps at bedtime.  120 capsule  5  . hydrochlorothiazide (HYDRODIURIL) 25 MG tablet TAKE 1/2 TABLET BY MOUTH EVERY DAY  30 tablet  5  . insulin glargine (LANTUS) 100 UNIT/ML injection Inject 0.04 mLs (4 Units total) into the skin at bedtime.  10 mL  5  . LANTUS 100 UNIT/ML injection INJECT 6 UNITS SUBCUTANEOUSLY AT BEDTIME  10 mL  3  . losartan (COZAAR) 100 MG tablet TAKE 1 TABLET BY MOUTH EVERY DAY  90 tablet  3  . metoprolol succinate (TOPROL-XL) 50 MG 24 hr tablet TAKE 1 TABLET BY MOUTH EVERY DAY *TAKE WITH OR IMMEDIATELY FOLLOWING A MEAL  90 tablet  3  . NOVOLOG 100 UNIT/ML injection TAKE AS DIRECTED PER GLUCOSE: 150-200 GIVE 2UNITS, IF 201-250 GIVE 3UNITS >250 GIVE 4 UNITS  10 mL  2  . rosuvastatin (CRESTOR) 10 MG tablet TAKE 1 TABLET BY MOUTH EVERY DAY  30 tablet  5  . diclofenac (VOLTAREN) 75 MG EC tablet Take 1 tablet (75 mg total) by mouth 2 (two) times daily.  30 tablet  0  . insulin aspart (NOVOLOG) 100 UNIT/ML injection Inject 1-3 Units into the skin 3 (three) times daily before meals.  1 vial  0  .  metFORMIN (GLUCOPHAGE) 1000 MG tablet TAKE 1 TABLET BY MOUTH TWO TIMES DAILY.  60 tablet  5  . polyethylene glycol powder (GLYCOLAX/MIRALAX) powder Take 17 g by mouth 3 (three) times a week.  527 g  4   No current facility-administered medications for this visit.    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  Slightly hyperactive bowel sounds.  Ext: no edema Skin: warm, dry, <3 second capillary refill  Neuro: grossly normal, moves all extremities  Assessment/Plan:  Viral Gastroenteritis Slowly resolving, may be norovirus as have been some cases. Discussed need to continue hydrating and then to resume solids slowly. Warning signs discussed. Discussed sugar can be elevated during illness but as long as 250 or less is reasonable to watch until illness is over. Offered ZOfran but nausea is not currently an issue and patient refused.

## 2013-10-12 ENCOUNTER — Other Ambulatory Visit: Payer: Self-pay | Admitting: Family Medicine

## 2013-10-15 ENCOUNTER — Ambulatory Visit (INDEPENDENT_AMBULATORY_CARE_PROVIDER_SITE_OTHER): Payer: No Typology Code available for payment source | Admitting: Ophthalmology

## 2013-10-20 ENCOUNTER — Other Ambulatory Visit: Payer: Self-pay | Admitting: *Deleted

## 2013-10-20 ENCOUNTER — Ambulatory Visit (HOSPITAL_COMMUNITY)
Admission: RE | Admit: 2013-10-20 | Discharge: 2013-10-20 | Disposition: A | Discharge status: Home / Self Care | Payer: Medicare Other | Source: Ambulatory Visit | Attending: Family Medicine | Admitting: Family Medicine

## 2013-10-20 DIAGNOSIS — Z1231 Encounter for screening mammogram for malignant neoplasm of breast: Secondary | ICD-10-CM | POA: Insufficient documentation

## 2013-10-20 MED ORDER — METFORMIN HCL 1000 MG PO TABS: ORAL_TABLET | ORAL | Status: DC

## 2013-10-24 ENCOUNTER — Ambulatory Visit: Payer: Medicare Other | Admitting: Family Medicine

## 2013-10-24 ENCOUNTER — Ambulatory Visit (INDEPENDENT_AMBULATORY_CARE_PROVIDER_SITE_OTHER): Payer: Medicare Other | Admitting: Family Medicine

## 2013-10-24 ENCOUNTER — Encounter: Payer: Self-pay | Admitting: Family Medicine

## 2013-10-24 VITALS — BP 158/83 | HR 76 | Temp 97.7°F | Ht 63.0 in | Wt 153.0 lb

## 2013-10-24 DIAGNOSIS — E1165 Type 2 diabetes mellitus with hyperglycemia: Secondary | ICD-10-CM | POA: Diagnosis not present

## 2013-10-24 DIAGNOSIS — N289 Disorder of kidney and ureter, unspecified: Secondary | ICD-10-CM | POA: Diagnosis not present

## 2013-10-24 DIAGNOSIS — M79609 Pain in unspecified limb: Secondary | ICD-10-CM | POA: Diagnosis not present

## 2013-10-24 DIAGNOSIS — E1149 Type 2 diabetes mellitus with other diabetic neurological complication: Secondary | ICD-10-CM | POA: Diagnosis not present

## 2013-10-24 DIAGNOSIS — E1139 Type 2 diabetes mellitus with other diabetic ophthalmic complication: Secondary | ICD-10-CM | POA: Diagnosis not present

## 2013-10-24 DIAGNOSIS — IMO0002 Reserved for concepts with insufficient information to code with codable children: Secondary | ICD-10-CM | POA: Diagnosis not present

## 2013-10-24 DIAGNOSIS — S2239XA Fracture of one rib, unspecified side, initial encounter for closed fracture: Secondary | ICD-10-CM | POA: Diagnosis not present

## 2013-10-24 DIAGNOSIS — IMO0001 Reserved for inherently not codable concepts without codable children: Secondary | ICD-10-CM | POA: Diagnosis not present

## 2013-10-24 DIAGNOSIS — E7800 Pure hypercholesterolemia, unspecified: Secondary | ICD-10-CM | POA: Diagnosis not present

## 2013-10-24 DIAGNOSIS — L600 Ingrowing nail: Secondary | ICD-10-CM | POA: Diagnosis not present

## 2013-10-24 DIAGNOSIS — I10 Essential (primary) hypertension: Secondary | ICD-10-CM | POA: Diagnosis not present

## 2013-10-24 DIAGNOSIS — M79674 Pain in right toe(s): Secondary | ICD-10-CM | POA: Insufficient documentation

## 2013-10-24 MED ORDER — CEPHALEXIN 500 MG PO CAPS: 500.0000 mg | ORAL_CAPSULE | Freq: Three times a day (TID) | ORAL | Status: DC

## 2013-10-24 NOTE — Assessment & Plan Note (Signed)
A: No signs/symptoms of ingrown nail. Does have TTP and dried yellow drainage most consistent with mild paronychia.  P: - Epsom salt soaks BID - Ibuprofen for pain - Keflex TID x5 days  - F/u with PCP prn

## 2013-10-24 NOTE — Progress Notes (Signed)
Patient ID: DEVORAH GIVHAN, female   DOB: 30-Dec-1955, 58 y.o.   MRN: [id number]    Subjective: HPI: Patient is a 58 y.o. female presenting to clinic today for same day appointment for toe pain. Sign language interpreter used the entire visit.  Toe pain- Right 4th toe started hurting 3 days ago, getting progressively worse. Sensitive to touch. Redness around bottom of nail. Never had anything like this before. Last weekend she noticed the nail looked broken and she tried to trim it, but no other injury. No new shoes, did not bump toe. Pain is where nail comes to side of toe. No swelling. Has not tried any medications. No fevers or fatigue.  History Reviewed: No smoker.  ROS: Please see HPI above.  Objective: Office vital signs reviewed. BP 158/83  Pulse 76  Temp(Src) 97.7 F (36.5 C) (Oral)  Ht 5\' 3"  (1.6 m)  Wt 153 lb (69.4 kg)  BMI 27.11 kg/m2  Physical Examination:  General: Awake, alert. NAD. HEENT: Atraumatic, normocephalic. MMM. Extremities: No edema. Right 4th toe with thick nail broke on lateral side with surrounding erythema. Dried yellow drainage noted around cuticle. TTP of entire distal toe but able to bend toe with no difficulty.  Neuro: Strength and sensationg rossly intact  Assessment: 58 y.o. female toenail pain  Plan: See Problem List and After Visit Summary

## 2013-10-24 NOTE — Patient Instructions (Signed)
Soak your foot in Epson salts at least twice a day. Take the Keflex three times daily for the next 5 days.  If anything gets worse or changes, please come back to see [name].  Alicia [name] M. Thaine Garriga, M.D.

## 2013-11-11 NOTE — Addendum Note (Signed)
Encounter addended by: Campbell Riches on: 11/11/2013 10:11 AM<BR>     Documentation filed: Orders

## 2013-11-12 ENCOUNTER — Ambulatory Visit (INDEPENDENT_AMBULATORY_CARE_PROVIDER_SITE_OTHER): Payer: Medicare Other | Admitting: Ophthalmology

## 2013-11-12 DIAGNOSIS — H35039 Hypertensive retinopathy, unspecified eye: Secondary | ICD-10-CM | POA: Diagnosis not present

## 2013-11-12 DIAGNOSIS — E11359 Type 2 diabetes mellitus with proliferative diabetic retinopathy without macular edema: Secondary | ICD-10-CM

## 2013-11-12 DIAGNOSIS — H2510 Age-related nuclear cataract, unspecified eye: Secondary | ICD-10-CM

## 2013-11-12 DIAGNOSIS — I10 Essential (primary) hypertension: Secondary | ICD-10-CM

## 2013-11-12 DIAGNOSIS — E1165 Type 2 diabetes mellitus with hyperglycemia: Secondary | ICD-10-CM | POA: Diagnosis not present

## 2013-11-12 DIAGNOSIS — E1139 Type 2 diabetes mellitus with other diabetic ophthalmic complication: Secondary | ICD-10-CM

## 2013-11-12 DIAGNOSIS — H43819 Vitreous degeneration, unspecified eye: Secondary | ICD-10-CM

## 2013-11-17 ENCOUNTER — Encounter: Payer: Self-pay | Admitting: *Deleted

## 2013-11-17 ENCOUNTER — Ambulatory Visit (INDEPENDENT_AMBULATORY_CARE_PROVIDER_SITE_OTHER): Payer: Medicare Other | Admitting: Sports Medicine

## 2013-11-17 ENCOUNTER — Encounter: Payer: Self-pay | Admitting: Sports Medicine

## 2013-11-17 VITALS — BP 155/87 | HR 81 | Temp 97.8°F | Wt 152.9 lb

## 2013-11-17 DIAGNOSIS — N289 Disorder of kidney and ureter, unspecified: Secondary | ICD-10-CM | POA: Diagnosis not present

## 2013-11-17 DIAGNOSIS — E1149 Type 2 diabetes mellitus with other diabetic neurological complication: Secondary | ICD-10-CM | POA: Diagnosis not present

## 2013-11-17 DIAGNOSIS — I10 Essential (primary) hypertension: Secondary | ICD-10-CM | POA: Diagnosis not present

## 2013-11-17 DIAGNOSIS — J029 Acute pharyngitis, unspecified: Secondary | ICD-10-CM | POA: Diagnosis not present

## 2013-11-17 DIAGNOSIS — M79609 Pain in unspecified limb: Secondary | ICD-10-CM | POA: Diagnosis not present

## 2013-11-17 DIAGNOSIS — R5381 Other malaise: Secondary | ICD-10-CM | POA: Diagnosis not present

## 2013-11-17 DIAGNOSIS — IMO0002 Reserved for concepts with insufficient information to code with codable children: Secondary | ICD-10-CM | POA: Diagnosis not present

## 2013-11-17 DIAGNOSIS — M79674 Pain in right toe(s): Secondary | ICD-10-CM

## 2013-11-17 DIAGNOSIS — E119 Type 2 diabetes mellitus without complications: Secondary | ICD-10-CM | POA: Diagnosis not present

## 2013-11-17 DIAGNOSIS — E11319 Type 2 diabetes mellitus with unspecified diabetic retinopathy without macular edema: Secondary | ICD-10-CM | POA: Diagnosis not present

## 2013-11-17 DIAGNOSIS — M19049 Primary osteoarthritis, unspecified hand: Secondary | ICD-10-CM

## 2013-11-17 DIAGNOSIS — E1139 Type 2 diabetes mellitus with other diabetic ophthalmic complication: Secondary | ICD-10-CM | POA: Diagnosis not present

## 2013-11-17 DIAGNOSIS — E7800 Pure hypercholesterolemia, unspecified: Secondary | ICD-10-CM | POA: Diagnosis not present

## 2013-11-17 DIAGNOSIS — R5383 Other fatigue: Secondary | ICD-10-CM | POA: Diagnosis not present

## 2013-11-17 DIAGNOSIS — IMO0001 Reserved for inherently not codable concepts without codable children: Secondary | ICD-10-CM | POA: Diagnosis not present

## 2013-11-17 DIAGNOSIS — S2239XA Fracture of one rib, unspecified side, initial encounter for closed fracture: Secondary | ICD-10-CM | POA: Diagnosis not present

## 2013-11-17 DIAGNOSIS — M189 Osteoarthritis of first carpometacarpal joint, unspecified: Secondary | ICD-10-CM

## 2013-11-17 DIAGNOSIS — E1165 Type 2 diabetes mellitus with hyperglycemia: Secondary | ICD-10-CM | POA: Diagnosis not present

## 2013-11-17 DIAGNOSIS — L600 Ingrowing nail: Secondary | ICD-10-CM | POA: Diagnosis not present

## 2013-11-17 DIAGNOSIS — J069 Acute upper respiratory infection, unspecified: Secondary | ICD-10-CM | POA: Diagnosis not present

## 2013-11-17 MED ORDER — DICLOFENAC SODIUM 75 MG PO TBEC: 75.0000 mg | DELAYED_RELEASE_TABLET | Freq: Two times a day (BID) | ORAL | Status: DC

## 2013-11-17 MED ORDER — DICLOFENAC SODIUM 1 % TD CREA: TOPICAL_CREAM | TRANSDERMAL | Status: DC

## 2013-11-17 NOTE — Progress Notes (Signed)
Alicia [name] - 58 y.o. female MRN [id number]  Date of birth: 1955/07/29  CC, SUBJECTIVE & ROS:     If applicable, see problem based charting for additional problem specific documentation.  HPI Comments: Patient presents with:   hand/foot pain - left thumb, right ring/middle finger, right foot  Patient reports insidious onset of the above sites of pain.  They tend to bother her more first thing in the morning and with significant use.  She was previously taking diclofenac but has not been taking NSAIDs over the past one to 2 months and reports the pain worsening since that time.  She has not tried any topical treatment.  She denies any significant swelling or surrounding erythema.  Her foot pain has been present for greater than 2 months.  Is worse with prolonged standing.  Located over the dorsum of the right foot.  Difficult time explaining the pain but denies numbness, tingling, burning.   HISTORY: Past Medical, Surgical, Social, and Family History Reviewed & Updated per EMR.  Pertinent Historical Findings include: Deaf, uses American sign language, diabetes, hypertension, hyperlipidemia, prior injections of hands for osteoarthritis  OBJECTIVE:  BP:155/87 mmHg  HR:81bpm  TEMP:97.8 F (36.6 C)(Oral)  RESP:   HT:    WT:152 lb 14.4 oz (69.355 kg)  BMI:  Physical Exam  Vitals reviewed. Constitutional: She is well-developed, well-nourished, and in no distress.  HENT:  Head: Normocephalic and atraumatic.  Right Ear: External ear normal.  Left Ear: External ear normal.  Neck: No JVD present. No tracheal deviation present.  Cardiovascular: Normal rate, regular rhythm and normal heart sounds.  Exam reveals no gallop and no friction rub.   No murmur heard. Pulmonary/Chest: Effort normal and breath sounds normal. No respiratory distress. She has no wheezes. She exhibits no tenderness.  Abdominal: Soft.  Musculoskeletal: She exhibits no edema and no tenderness.  Neurological: She is  alert.  Moves all 4 extremities spontaneously; no lateralization.  Skin: Skin is warm and dry. She is not diaphoretic.  Psychiatric: Mood, memory, affect and judgment normal.   right-hand Exam: Appear:  normal-appearing, overall good alignment with slightly hypertrophic first MCP   Palp:  no pain to palpation   ROM:  restricted thumb extension and abduction   NV:   capillary refill   Testing:  no joint laxity on varus or valgus strain    Right foot Exam: Appear:  wearing flip-flops, normal-appearing, moderate arch, mild splayed toe with weight bearing   Palp:  no focal area of tenderness, unable to reproduce any pain   ROM:  for ankle range of motion, good forefoot motion   NV:   good capillary refill, sensation appears to be intact   Testing:  negative metatarsal squeeze test, negative ankle drawer, negative talar tilt     MEDICATIONS, LABS & OTHER ORDERS: Previous Medications   ASPIRIN (BAYER CHILDRENS ASPIRIN) 81 MG CHEWABLE TABLET    Chew 81 mg by mouth daily.     CALCIUM CARBONATE-VITAMIN D (CALCIUM 600/VITAMIN D) 600-400 MG-UNIT PER CHEW TABLET    Chew 1 tablet by mouth 2 (two) times daily.    CEPHALEXIN (KEFLEX) 500 MG CAPSULE    Take 1 capsule (500 mg total) by mouth 3 (three) times daily.   CHOLECALCIFEROL (VITAMIN D) 1000 UNITS TABLET    Take 1,000 Units by mouth daily.   CYANOCOBALAMIN 500 MCG TABLET    Take 500 mcg by mouth daily.   DICLOFENAC (VOLTAREN) 75 MG EC TABLET    Take  1 tablet (75 mg total) by mouth 2 (two) times daily.   FERROUS SULFATE 325 (65 FE) MG TABLET    Take 1 tablet (325 mg total) by mouth daily with breakfast.   FLUTICASONE (FLONASE) 50 MCG/ACT NASAL SPRAY    2 sprays to each nostril daily. Use nasal saline before use.   GABAPENTIN (NEURONTIN) 300 MG CAPSULE    Take 1 capsule (300 mg total) by mouth 3 (three) times daily. Take 1 cap twice daily and 2 caps at bedtime.   HYDROCHLOROTHIAZIDE (HYDRODIURIL) 25 MG TABLET    TAKE 1/2 TABLET BY MOUTH EVERY DAY     INSULIN ASPART (NOVOLOG) 100 UNIT/ML INJECTION    Inject 1-3 Units into the skin 3 (three) times daily before meals.   INSULIN GLARGINE (LANTUS) 100 UNIT/ML INJECTION    Inject 0.04 mLs (4 Units total) into the skin at bedtime.   LANTUS 100 UNIT/ML INJECTION    INJECT 6 UNITS SUBCUTANEOUSLY AT BEDTIME   LOSARTAN (COZAAR) 100 MG TABLET    TAKE 1 TABLET BY MOUTH EVERY DAY   METFORMIN (GLUCOPHAGE) 1000 MG TABLET    TAKE 1 TABLET BY MOUTH TWO TIMES DAILY.   METOPROLOL SUCCINATE (TOPROL-XL) 50 MG 24 HR TABLET    TAKE 1 TABLET BY MOUTH EVERY DAY *TAKE WITH OR IMMEDIATELY FOLLOWING A MEAL   NOVOLOG 100 UNIT/ML INJECTION    TAKE AS DIRECTED PER GLUCOSE: 150-200 GIVE 2UNITS, IF 201-250 GIVE 3UNITS >250 GIVE 4 UNITS   POLYETHYLENE GLYCOL POWDER (GLYCOLAX/MIRALAX) POWDER    Take 17 g by mouth 3 (three) times a week.   ROSUVASTATIN (CRESTOR) 10 MG TABLET    TAKE 1 TABLET BY MOUTH EVERY DAY   Modified Medications   No medications on file   New Prescriptions   No medications on file   Discontinued Medications   No medications on file  No orders of the defined types were placed in this encounter.   ASSESSMENT & PLAN: See problem based charting & AVS for pt instructions.

## 2013-11-17 NOTE — Patient Instructions (Addendum)
I recommend you use cushioned Insoles in your shoes.  You can try this website or find one over-the-counter: https://www.hapad.com/products/heel-arch-and-[email or web address]-support/comf-orthoticr-sports-replacement-insoles  I am prescribing a medicine to apply to your hand directly up to 4 times per day.  I like you to take diclofenac by mouth twice per day for the next 5 days and then as needed  If you or not improved I recommend you followup in our sports medicine clinic for ultrasound guided injection of your thumb.  Diclofenac PO and topical

## 2013-11-18 NOTE — Assessment & Plan Note (Signed)
Chronic condition  - has evidently responded well to injections previously but has not tried any topical therapy and was doing well on by mouth therapy previously. 1. Short course of by mouth Voltaren with additional topical Voltaren as needed.  If not improved return to [facility] for US guided injection. > consider imaging

## 2013-11-18 NOTE — Assessment & Plan Note (Signed)
Subacute condition  - nonfocal today on exam.  Questionable functional transverse arch insufficiency given splay toe  with weightbearing.  Likely nonspecific metatarsalgia 1. Trial of OTC orthotics > Consider ultrasound for evaluation of metatarsal stress fracture but no associated increased activity or change in activity

## 2013-11-26 ENCOUNTER — Ambulatory Visit: Payer: Medicare Other

## 2013-11-27 ENCOUNTER — Telehealth: Payer: Self-pay | Admitting: Family Medicine

## 2013-11-27 NOTE — Telephone Encounter (Signed)
Pt called because her insurance will not cover her diabetes supplies. She needs our office to fax records and why she needs to prick her finger so that they will cover this. If you have any questions please call her. jw

## 2013-11-28 NOTE — Telephone Encounter (Signed)
Spoke with Alicia [name], pt will need to sign release of information agreement first. Can you have her come by to do this?

## 2013-11-28 NOTE — Telephone Encounter (Signed)
Message was left via phone/video interpretation due to deafness. Informed of below and left phone number to call us back

## 2013-12-01 ENCOUNTER — Telehealth: Payer: Self-pay | Admitting: *Deleted

## 2013-12-01 DIAGNOSIS — IMO0002 Reserved for concepts with insufficient information to code with codable children: Secondary | ICD-10-CM

## 2013-12-01 DIAGNOSIS — E118 Type 2 diabetes mellitus with unspecified complications: Principal | ICD-10-CM

## 2013-12-01 DIAGNOSIS — E1165 Type 2 diabetes mellitus with hyperglycemia: Secondary | ICD-10-CM

## 2013-12-01 NOTE — Telephone Encounter (Signed)
Pt called and stated she is on her last test strips and lancets.  She uses Accu-Check meter, test strips and lancets.  Pt test blood sugar 4 times a day. Pt requested a new meter; old one is cracked.   Derl Barrow, RN

## 2013-12-03 MED ORDER — GLUCOSE BLOOD VI STRP: ORAL_STRIP | Status: DC

## 2013-12-03 MED ORDER — ACCU-CHEK SOFT TOUCH LANCETS MISC: Status: DC

## 2013-12-03 MED ORDER — ACCU-CHEK AVIVA DEVI: Status: DC

## 2013-12-03 NOTE — Telephone Encounter (Signed)
Patient informed.[place]

## 2013-12-05 ENCOUNTER — Encounter (INDEPENDENT_AMBULATORY_CARE_PROVIDER_SITE_OTHER): Payer: Medicare Other | Admitting: Ophthalmology

## 2013-12-05 DIAGNOSIS — E11359 Type 2 diabetes mellitus with proliferative diabetic retinopathy without macular edema: Secondary | ICD-10-CM | POA: Diagnosis not present

## 2013-12-05 DIAGNOSIS — E1139 Type 2 diabetes mellitus with other diabetic ophthalmic complication: Secondary | ICD-10-CM

## 2013-12-05 DIAGNOSIS — E1165 Type 2 diabetes mellitus with hyperglycemia: Secondary | ICD-10-CM | POA: Diagnosis not present

## 2013-12-12 ENCOUNTER — Encounter (INDEPENDENT_AMBULATORY_CARE_PROVIDER_SITE_OTHER): Payer: Medicare Other | Admitting: Ophthalmology

## 2013-12-15 ENCOUNTER — Other Ambulatory Visit (INDEPENDENT_AMBULATORY_CARE_PROVIDER_SITE_OTHER): Payer: Medicare Other | Admitting: Ophthalmology

## 2013-12-26 ENCOUNTER — Other Ambulatory Visit (INDEPENDENT_AMBULATORY_CARE_PROVIDER_SITE_OTHER): Payer: Medicare Other | Admitting: Ophthalmology

## 2013-12-26 DIAGNOSIS — E1139 Type 2 diabetes mellitus with other diabetic ophthalmic complication: Secondary | ICD-10-CM | POA: Diagnosis not present

## 2013-12-26 DIAGNOSIS — E1165 Type 2 diabetes mellitus with hyperglycemia: Secondary | ICD-10-CM | POA: Diagnosis not present

## 2013-12-26 DIAGNOSIS — E11359 Type 2 diabetes mellitus with proliferative diabetic retinopathy without macular edema: Secondary | ICD-10-CM | POA: Diagnosis not present

## 2014-01-05 ENCOUNTER — Encounter: Payer: Self-pay | Admitting: Family Medicine

## 2014-01-05 ENCOUNTER — Ambulatory Visit (INDEPENDENT_AMBULATORY_CARE_PROVIDER_SITE_OTHER): Payer: Medicare Other | Admitting: Family Medicine

## 2014-01-05 VITALS — BP 176/80 | HR 70 | Temp 97.6°F | Ht 63.0 in | Wt 155.0 lb

## 2014-01-05 DIAGNOSIS — I10 Essential (primary) hypertension: Secondary | ICD-10-CM

## 2014-01-05 DIAGNOSIS — R5381 Other malaise: Secondary | ICD-10-CM | POA: Diagnosis not present

## 2014-01-05 DIAGNOSIS — G5601 Carpal tunnel syndrome, right upper limb: Secondary | ICD-10-CM

## 2014-01-05 DIAGNOSIS — E11319 Type 2 diabetes mellitus with unspecified diabetic retinopathy without macular edema: Secondary | ICD-10-CM | POA: Diagnosis not present

## 2014-01-05 DIAGNOSIS — M19049 Primary osteoarthritis, unspecified hand: Secondary | ICD-10-CM | POA: Diagnosis not present

## 2014-01-05 DIAGNOSIS — E1139 Type 2 diabetes mellitus with other diabetic ophthalmic complication: Secondary | ICD-10-CM | POA: Diagnosis not present

## 2014-01-05 DIAGNOSIS — G5600 Carpal tunnel syndrome, unspecified upper limb: Secondary | ICD-10-CM | POA: Diagnosis not present

## 2014-01-05 DIAGNOSIS — E1149 Type 2 diabetes mellitus with other diabetic neurological complication: Secondary | ICD-10-CM | POA: Diagnosis not present

## 2014-01-05 DIAGNOSIS — IMO0001 Reserved for inherently not codable concepts without codable children: Secondary | ICD-10-CM | POA: Diagnosis not present

## 2014-01-05 DIAGNOSIS — M79609 Pain in unspecified limb: Secondary | ICD-10-CM | POA: Diagnosis not present

## 2014-01-05 DIAGNOSIS — E1165 Type 2 diabetes mellitus with hyperglycemia: Secondary | ICD-10-CM | POA: Diagnosis not present

## 2014-01-05 DIAGNOSIS — N289 Disorder of kidney and ureter, unspecified: Secondary | ICD-10-CM | POA: Diagnosis not present

## 2014-01-05 DIAGNOSIS — IMO0002 Reserved for concepts with insufficient information to code with codable children: Secondary | ICD-10-CM | POA: Diagnosis not present

## 2014-01-05 DIAGNOSIS — J069 Acute upper respiratory infection, unspecified: Secondary | ICD-10-CM | POA: Diagnosis not present

## 2014-01-05 DIAGNOSIS — E119 Type 2 diabetes mellitus without complications: Secondary | ICD-10-CM | POA: Diagnosis not present

## 2014-01-05 DIAGNOSIS — M1812 Unilateral primary osteoarthritis of first carpometacarpal joint, left hand: Secondary | ICD-10-CM

## 2014-01-05 DIAGNOSIS — L600 Ingrowing nail: Secondary | ICD-10-CM | POA: Diagnosis not present

## 2014-01-05 DIAGNOSIS — J029 Acute pharyngitis, unspecified: Secondary | ICD-10-CM | POA: Diagnosis not present

## 2014-01-05 DIAGNOSIS — E7800 Pure hypercholesterolemia, unspecified: Secondary | ICD-10-CM | POA: Diagnosis not present

## 2014-01-05 DIAGNOSIS — S2239XA Fracture of one rib, unspecified side, initial encounter for closed fracture: Secondary | ICD-10-CM | POA: Diagnosis not present

## 2014-01-05 NOTE — Assessment & Plan Note (Signed)
-  High BP noted and discussed with patient today.  Increase in BP likely due to headache patient was experiencing. -Patient counseled on red flags and instructed to seek medical attention should she experience any of them. -Patient will continue to monitor BP at home.

## 2014-01-05 NOTE — Patient Instructions (Signed)
Ms. Oloughlin, it was a pleasure meeting you today!  Based on your exam, I suspect that you have carpal tunnel of the right wrist.  Please make sure that you brace your wrist at night for at least 6-8 hours.  You should see some improvement within a few weeks.  In the event that symptoms do not improve, please schedule a visit for an ultrasound of the wrist with our office.  I suspect that the pain in your left thumb is due to overuse.  Tylenol and keeping this area protected will help.  Feel free to use ice/heat as well.  Carpal Tunnel Syndrome The carpal tunnel is an area under the skin of the palm of your hand. Nerves, blood vessels, and strong tissues (tendons) pass through the tunnel. The tunnel can become puffy (swollen). If this happens, a nerve can be pinched in the wrist. This causes carpal tunnel syndrome.  HOME CARE  Take all medicine as told by your doctor.  If you were given a splint, wear it as told. Wear it at night or at times when your doctor told you to.  Rest your wrist from the activity that causes your pain.  Put ice on your wrist after long periods of wrist activity.  Put ice in a plastic bag.  Place a towel between your skin and the bag.  Leave the ice on for 15-20 minutes, 03-04 times a day.  Keep all doctor visits as told. GET HELP RIGHT AWAY IF:  You have new problems you cannot explain.  Your problems get worse and medicine does not help. MAKE SURE YOU:   Understand these instructions.  Will watch your condition.  Will get help right away if you are not doing well or get worse. Document Released: 06/08/2011 Document Revised: 09/11/2011 Document Reviewed: 06/08/2011 [facility] Patient Information 2015 [place], [place]. This information is not intended to replace advice given to you by your health care provider. Make sure you discuss any questions you have with your health care provider.

## 2014-01-05 NOTE — Progress Notes (Addendum)
Patient ID: Alicia [name], female   DOB: [birth year], 58 y.o.   MRN: [id number]    Subjective: HPI: Patient is a 58 y.o. female presenting to clinic today for pain in her hands. Concerns today include ongoing hand pain  1. Hand pain: Patient is accompanied by a sign language interpreter during this visit. She was seen in May for pain in her right wrist and left thumb.  The pain has been present for a couple of months.  She states that she has been using Voltaren gel and tablets as directed with no relief.  She has purchased copper "gloves" which slightly improve pain in both the right and left hands but states that the gloves are difficult to use due to her work.  She has used heat and cold compresses with no relief.  She states that she also experiences an occasional "popping" sensation in the right hand.  She admits to occasional weakness of the right hand after repetitive movement.  She admits to numbness/tingling in her RIGHT middle and ring fingers.  She denies injury to either areas.     2. Hypertension: Patient states that she is currently having a headache.  She checked her BP today at home and it was 145/70.  She denies any dizziness or blurred vision.  ROS: Please see HPI above.  Objective: Office vital signs reviewed. BP 176/80  Pulse 70  Temp(Src) 97.6 F (36.4 C) (Oral)  Ht 5\' 3"  (1.6 m)  Wt 155 lb (70.308 kg)  BMI 27.46 kg/m2  Physical Examination:  General: Awake, alert. NAD, accompanied by interpreter. HEENT: Atraumatic, normocephalic Neuro: Strength grossly intact MSK:     RIGHT hand:  passive and active ROM intact, pain illicited with radiculopathy on both active and passive ROM.  No gross bony deformities noted.  No discoloration of skin.  No edema, no erythema.  POSITIVE Tinel's.  Negative Phalen's. Negative Reverse Phalen's.  Strength 5/5. Sensation slightly less over 3rd and 4th digits.      LEFT hand: passive and active ROM intact, pain illicited on active ROM.   No gross bony deformities noted.  No discoloration.  No swelling or erythema.  Strength 5/5.  Sensation intact.  PROCEDURE NOTE: Patient gave written consent for OMM for carpal tunnel of right wrist.  Patient tolerated procedure well.  Assessment: 58 y.o. female  1. Carpal tunnel right wrist 2. Osteoarthritis of carpometacarpal joint vs overuse injury (Left) 3. Hypertension  Plan: See Problem List and After Visit Summary  Ashawn Rinehart M. Lajuana Ripple, DO

## 2014-01-05 NOTE — Assessment & Plan Note (Addendum)
-  Based on HPI and exam, I suspect that the pain in patient's Left capometacarpal joint of the thumb is a result of OA.  DDx include overuse injury. -Patient encouraged to rest the thumb, use ice/heat and use OTC tylenol or motrin for pain relief.

## 2014-01-05 NOTE — Assessment & Plan Note (Signed)
-  Based on HPI and exam, I suspect the patient has carpal tunnel syndrome.  DDx include overuse injury, osteoarthritis of the wrist. -Patient educated and given literature about carpal tunnel syndrome -Patient instructed to use a wrist brace at night for at least 6-8 hours -OMM of the wrist performed and stretches given to the patient to perform at home -Patient instructed to call and schedule an appointment for an ultrasound +/- steroid injection (if appropriate) if she sees no improvement with prescribed regimen in 3 weeks.

## 2014-01-07 ENCOUNTER — Other Ambulatory Visit: Payer: Medicare Other

## 2014-01-14 ENCOUNTER — Other Ambulatory Visit: Payer: Self-pay | Admitting: Family Medicine

## 2014-01-19 ENCOUNTER — Encounter (HOSPITAL_COMMUNITY): Payer: Self-pay | Admitting: Emergency Medicine

## 2014-01-19 ENCOUNTER — Emergency Department (INDEPENDENT_AMBULATORY_CARE_PROVIDER_SITE_OTHER)
Admission: EM | Admit: 2014-01-19 | Discharge: 2014-01-19 | Disposition: A | Discharge status: Home / Self Care | Payer: Medicare Other | Source: Home / Self Care | Attending: Family Medicine | Admitting: Family Medicine

## 2014-01-19 DIAGNOSIS — M542 Cervicalgia: Secondary | ICD-10-CM | POA: Diagnosis not present

## 2014-01-19 MED ORDER — IBUPROFEN 800 MG PO TABS
800.0000 mg | ORAL_TABLET | Freq: Once | ORAL | Status: AC
  Administered 2014-01-19: 800 mg via ORAL

## 2014-01-19 MED ORDER — IBUPROFEN 800 MG PO TABS
ORAL_TABLET | ORAL | Status: AC
  Filled 2014-01-19: qty 1
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
## 2014-01-20 NOTE — ED Provider Notes (Signed)
Medical screening examination/treatment/procedure(s) were performed by resident physician or non-physician practitioner and as supervising physician I was immediately available for consultation/collaboration.   Pauline Good MD.   Billy Fischer, MD 01/20/14 1125

## 2014-01-28 DIAGNOSIS — E11359 Type 2 diabetes mellitus with proliferative diabetic retinopathy without macular edema: Secondary | ICD-10-CM | POA: Diagnosis not present

## 2014-01-28 DIAGNOSIS — E1139 Type 2 diabetes mellitus with other diabetic ophthalmic complication: Secondary | ICD-10-CM | POA: Diagnosis not present

## 2014-01-28 DIAGNOSIS — H5319 Other subjective visual disturbances: Secondary | ICD-10-CM | POA: Diagnosis not present

## 2014-01-28 DIAGNOSIS — H2589 Other age-related cataract: Secondary | ICD-10-CM | POA: Diagnosis not present

## 2014-01-29 ENCOUNTER — Encounter: Payer: Self-pay | Admitting: Family Medicine

## 2014-01-29 ENCOUNTER — Ambulatory Visit (INDEPENDENT_AMBULATORY_CARE_PROVIDER_SITE_OTHER): Payer: Medicare Other | Admitting: Family Medicine

## 2014-01-29 ENCOUNTER — Other Ambulatory Visit: Payer: Medicare Other

## 2014-01-29 VITALS — BP 144/75 | HR 92 | Temp 98.1°F | Ht 63.0 in | Wt 155.8 lb

## 2014-01-29 DIAGNOSIS — E1149 Type 2 diabetes mellitus with other diabetic neurological complication: Secondary | ICD-10-CM

## 2014-01-29 DIAGNOSIS — J029 Acute pharyngitis, unspecified: Secondary | ICD-10-CM | POA: Diagnosis not present

## 2014-01-29 DIAGNOSIS — M19049 Primary osteoarthritis, unspecified hand: Secondary | ICD-10-CM | POA: Diagnosis not present

## 2014-01-29 DIAGNOSIS — IMO0001 Reserved for inherently not codable concepts without codable children: Secondary | ICD-10-CM | POA: Diagnosis not present

## 2014-01-29 DIAGNOSIS — M1812 Unilateral primary osteoarthritis of first carpometacarpal joint, left hand: Secondary | ICD-10-CM

## 2014-01-29 DIAGNOSIS — IMO0002 Reserved for concepts with insufficient information to code with codable children: Secondary | ICD-10-CM

## 2014-01-29 DIAGNOSIS — E1165 Type 2 diabetes mellitus with hyperglycemia: Secondary | ICD-10-CM | POA: Diagnosis not present

## 2014-01-29 DIAGNOSIS — E11319 Type 2 diabetes mellitus with unspecified diabetic retinopathy without macular edema: Secondary | ICD-10-CM | POA: Diagnosis not present

## 2014-01-29 DIAGNOSIS — E119 Type 2 diabetes mellitus without complications: Secondary | ICD-10-CM | POA: Diagnosis not present

## 2014-01-29 DIAGNOSIS — S2239XA Fracture of one rib, unspecified side, initial encounter for closed fracture: Secondary | ICD-10-CM | POA: Diagnosis not present

## 2014-01-29 DIAGNOSIS — G5600 Carpal tunnel syndrome, unspecified upper limb: Secondary | ICD-10-CM

## 2014-01-29 DIAGNOSIS — E1139 Type 2 diabetes mellitus with other diabetic ophthalmic complication: Secondary | ICD-10-CM | POA: Diagnosis not present

## 2014-01-29 DIAGNOSIS — I10 Essential (primary) hypertension: Secondary | ICD-10-CM | POA: Diagnosis not present

## 2014-01-29 DIAGNOSIS — E118 Type 2 diabetes mellitus with unspecified complications: Principal | ICD-10-CM

## 2014-01-29 DIAGNOSIS — J069 Acute upper respiratory infection, unspecified: Secondary | ICD-10-CM | POA: Diagnosis not present

## 2014-01-29 DIAGNOSIS — G5601 Carpal tunnel syndrome, right upper limb: Secondary | ICD-10-CM

## 2014-01-29 DIAGNOSIS — M79609 Pain in unspecified limb: Secondary | ICD-10-CM | POA: Diagnosis not present

## 2014-01-29 DIAGNOSIS — R5383 Other fatigue: Secondary | ICD-10-CM | POA: Diagnosis not present

## 2014-01-29 DIAGNOSIS — R5381 Other malaise: Secondary | ICD-10-CM | POA: Diagnosis not present

## 2014-01-29 DIAGNOSIS — K3184 Gastroparesis: Secondary | ICD-10-CM

## 2014-01-29 DIAGNOSIS — L600 Ingrowing nail: Secondary | ICD-10-CM | POA: Diagnosis not present

## 2014-01-29 DIAGNOSIS — N289 Disorder of kidney and ureter, unspecified: Secondary | ICD-10-CM | POA: Diagnosis not present

## 2014-01-29 DIAGNOSIS — E7800 Pure hypercholesterolemia, unspecified: Secondary | ICD-10-CM | POA: Diagnosis not present

## 2014-01-29 LAB — LIPID PANEL
Cholesterol: 155 mg/dL (ref 0–200)
HDL: 73 mg/dL (ref >39)
LDL Cholesterol: 75 mg/dL (ref 0–99)
Total CHOL/HDL Ratio: 2.1 Ratio
Triglycerides: 34 mg/dL (ref <150)
VLDL: 7 mg/dL (ref 0–40)

## 2014-01-29 LAB — POCT GLYCOSYLATED HEMOGLOBIN (HGB A1C): Hemoglobin A1C: 7.9

## 2014-01-29 MED ORDER — HYDROCHLOROTHIAZIDE 50 MG PO TABS: 50.0000 mg | ORAL_TABLET | Freq: Every day | ORAL | Status: DC

## 2014-01-29 MED ORDER — METOCLOPRAMIDE HCL 10 MG PO TABS: 10.0000 mg | ORAL_TABLET | Freq: Four times a day (QID) | ORAL | Status: DC

## 2014-01-29 NOTE — Patient Instructions (Signed)
Thank you for coming in today!  We are checking some labs today, and I will call you if they are abnormal. If you do not hear from me by phone or letter in 2 weeks, please call us as I may have been unable to reach you.   As you leave, make an appointment to follow up with me in 3 months.  - Increase your evening lantus dose slowly; check your blood sugar every morning and if 3 consecutive readings are above 150mg /dL, increase that night's insulin by 1 unit. If the next 3 consecutive readings are above 150mg /dL, increase lantus by 1 more unit, and so on until you are consistently below 150mg /dL in the morning. If you begin having frequent hypoglycemic episodes, back down on lantus and call the office so we can change your dosing.  - Take reglan as needed for stomach pain/gas. It will work best before a meal.  - Increase HCTZ from 25mg  (1/2 a pill) to 50mg  (whole pill) - I am referring you to sports medicine clinic, right next door, to be evaluated for carpal tunnel syndrome and possible injection.   Take care and seek immediate care sooner if you develop any concerns.  Please feel free to call with any questions or concerns at any time, at [phone]. - Dr. Bonner Puna

## 2014-01-29 NOTE — Progress Notes (Signed)
Patient ID: Alicia [name], female   DOB: 1955/08/03, 58 y.o.   MRN: [id number]   Subjective:  Alicia [name] is a 58 y.o. female with a history of T2DM with neuropathy, retinopathy, and gastroparesis here for diabetes follow up.   T2DM:  Checks blood sugar at home. Reports average range 190-220. Average AM fasting CBG: 200. Lowest: 39 in the afternoon, with symptoms treated with juice. Takes medications about 7 days out of an average week.   Last eye exam: Yesterday Last foot exam: Today Pneumovax UTD  Review of Systems:  Denies fever, chills, weight loss, dizziness, vision changes, syncope, polyuria, nocturia, paresthesias, polydipsia, chest pain, new wounds.  All other systems reviewed and are negative.    Smoking status: Non-smoker EtOH:  Patient Active Problem List   Diagnosis Date Noted  . DIABETES MELLITUS, II, COMPLICATIONS 88/41/6606    Priority: High  . HEARING LOSS NOS OR DEAFNESS 08/30/2006    Priority: High  . Gastroparesis 04/15/2010    Priority: Medium  . ASTHMA, INTERMITTENT 04/04/2010    Priority: Medium  . HYPOGLYCEMIA 02/21/2010    Priority: Medium  . NEUROPATHY, DIABETIC 08/30/2006    Priority: Medium  . HYPERCHOLESTEROLEMIA 08/30/2006    Priority: Medium  . HYPERTENSION, BENIGN SYSTEMIC 08/30/2006    Priority: Medium  . Osteoarthritis of carpometacarpal joint of thumb 04/07/2011    Priority: Low  . Renal lesion 09/12/2010    Priority: Low  . GERD 12/30/2007    Priority: Low  . ARTHRITIS, KNEE 09/17/2007    Priority: Low  . RHINITIS, ALLERGIC 08/30/2006    Priority: Low  . Carpal tunnel syndrome 01/05/2014  . Pain of toe of right foot 10/24/2013  . Myalgia 07/10/2013  . URI (upper respiratory infection) 07/07/2013  . Diabetic retinopathy 07/21/2011   Medications: reviewed and updated Objective:  BP 144/75  Pulse 92  Temp(Src) 98.1 F (36.7 C) (Oral)  Ht 5\' 3"  (1.6 m)  Wt 155 lb 12.8 oz (70.67 kg)  BMI 27.61 kg/m2 Gen: Well-appearing 58  y.o.female in NAD HEENT: Normocephalic, sclerae/conjunctivae clear, PERRL, MMM, posterior oropharynx clear, good dentition Neck: neck supple, no masses appreciated; thyroid not enlarged  Pulm: Non-labored; CTAB, no wheezes  CV: Regular rate, no murmur appreciated; no LE edema GI: NA BS; soft, non-tender, non-distended, no HSM, no hernia Skin: no acanthosis nigricans Foot: Inspection reveals no lesions/sores between toes. Some callus formation. No fissures, erythema, signs of infection or gross deformities. Posterior tibial and dorsalis pedis pulses are 2+, capillary refill is brisk. Sensation is intact to monofilament exam at the great toe, medial, central, lateral ball and posterior foot bilaterally.  Neuro: CN II-XII without deficits, sensation intact to light touch, steady gait. Psych: A&Ox3, mood good with full affect, intact recent and remote memory, good judgment, good insight  Lab Results  Component Value Date   CREATININE 0.53 07/10/2013   Lab Results  Component Value Date   HGBA1C 7.9 01/29/2014   Lab Results  Component Value Date   CHOL 153 01/24/2013   HDL 75 01/24/2013   LDLCALC 71 01/24/2013   LDLDIRECT 78 07/10/2013   TRIG 33 01/24/2013   CHOLHDL 2.0 01/24/2013   Assessment:    Alicia [name] is a 58 y.o. female here for diabetes, uncontrolled, with complications.   Plan:  See problem list for problem-specific plans.  Type II diabetes mellitus: Hb A1c: 7.9%. Goal Hb A1c: < 7.0%. End-organ damage causing retinopathy, peripheral neuropathy, and gastroparesis. - Increase lantus 4u qHS slowly  until fasting CBGs 150mg /dL, continue novolog 1-3u TID AC, metformin 1000mg  BID. - Creatinine stable, continue ARB - Continue ASA daily - Continue high-intensity statin (crestor); annual lipid panel today. - Follow up in 3 months to include Hb A1c, and foot exam. - Eye exam is up to date.  - Foot exam today was normal.  - Neurontin for neuropathy  HTN: Just above goal. Continue  metoprolol 50mg , losartan 100mg ; increase HCTZ to 50mg

## 2014-01-31 ENCOUNTER — Encounter: Payer: Self-pay | Admitting: Family Medicine

## 2014-02-06 ENCOUNTER — Ambulatory Visit (INDEPENDENT_AMBULATORY_CARE_PROVIDER_SITE_OTHER): Payer: Medicare Other | Admitting: Family Medicine

## 2014-02-06 ENCOUNTER — Encounter: Payer: Self-pay | Admitting: Family Medicine

## 2014-02-06 VITALS — BP 138/78 | Ht 64.0 in | Wt 135.0 lb

## 2014-02-06 DIAGNOSIS — E1165 Type 2 diabetes mellitus with hyperglycemia: Secondary | ICD-10-CM | POA: Diagnosis not present

## 2014-02-06 DIAGNOSIS — E1139 Type 2 diabetes mellitus with other diabetic ophthalmic complication: Secondary | ICD-10-CM | POA: Diagnosis not present

## 2014-02-06 DIAGNOSIS — IMO0002 Reserved for concepts with insufficient information to code with codable children: Secondary | ICD-10-CM | POA: Diagnosis not present

## 2014-02-06 DIAGNOSIS — J069 Acute upper respiratory infection, unspecified: Secondary | ICD-10-CM | POA: Diagnosis not present

## 2014-02-06 DIAGNOSIS — G5600 Carpal tunnel syndrome, unspecified upper limb: Secondary | ICD-10-CM | POA: Diagnosis not present

## 2014-02-06 DIAGNOSIS — M19049 Primary osteoarthritis, unspecified hand: Secondary | ICD-10-CM | POA: Diagnosis not present

## 2014-02-06 DIAGNOSIS — N289 Disorder of kidney and ureter, unspecified: Secondary | ICD-10-CM | POA: Diagnosis not present

## 2014-02-06 DIAGNOSIS — M79609 Pain in unspecified limb: Secondary | ICD-10-CM | POA: Diagnosis not present

## 2014-02-06 DIAGNOSIS — M1811 Unilateral primary osteoarthritis of first carpometacarpal joint, right hand: Secondary | ICD-10-CM

## 2014-02-06 DIAGNOSIS — M6530 Trigger finger, unspecified finger: Secondary | ICD-10-CM | POA: Diagnosis not present

## 2014-02-06 DIAGNOSIS — K3184 Gastroparesis: Secondary | ICD-10-CM | POA: Diagnosis not present

## 2014-02-06 DIAGNOSIS — E11319 Type 2 diabetes mellitus with unspecified diabetic retinopathy without macular edema: Secondary | ICD-10-CM | POA: Diagnosis not present

## 2014-02-06 DIAGNOSIS — M1812 Unilateral primary osteoarthritis of first carpometacarpal joint, left hand: Secondary | ICD-10-CM

## 2014-02-06 DIAGNOSIS — J029 Acute pharyngitis, unspecified: Secondary | ICD-10-CM | POA: Diagnosis not present

## 2014-02-06 DIAGNOSIS — R5383 Other fatigue: Secondary | ICD-10-CM | POA: Diagnosis not present

## 2014-02-06 DIAGNOSIS — M65312 Trigger thumb, left thumb: Secondary | ICD-10-CM

## 2014-02-06 DIAGNOSIS — L600 Ingrowing nail: Secondary | ICD-10-CM | POA: Diagnosis not present

## 2014-02-06 DIAGNOSIS — E7800 Pure hypercholesterolemia, unspecified: Secondary | ICD-10-CM | POA: Diagnosis not present

## 2014-02-06 DIAGNOSIS — E1149 Type 2 diabetes mellitus with other diabetic neurological complication: Secondary | ICD-10-CM | POA: Diagnosis not present

## 2014-02-06 DIAGNOSIS — S2239XA Fracture of one rib, unspecified side, initial encounter for closed fracture: Secondary | ICD-10-CM | POA: Diagnosis not present

## 2014-02-06 DIAGNOSIS — R5381 Other malaise: Secondary | ICD-10-CM | POA: Diagnosis not present

## 2014-02-06 DIAGNOSIS — E119 Type 2 diabetes mellitus without complications: Secondary | ICD-10-CM | POA: Diagnosis not present

## 2014-02-06 DIAGNOSIS — IMO0001 Reserved for inherently not codable concepts without codable children: Secondary | ICD-10-CM | POA: Diagnosis not present

## 2014-02-06 DIAGNOSIS — I10 Essential (primary) hypertension: Secondary | ICD-10-CM | POA: Diagnosis not present

## 2014-02-06 MED ORDER — METHYLPREDNISOLONE ACETATE 40 MG/ML IJ SUSP
40.0000 mg | Freq: Once | INTRAMUSCULAR | Status: AC
  Administered 2014-02-06: 40 mg via INTRA_ARTICULAR

## 2014-02-06 NOTE — Assessment & Plan Note (Signed)
Injection into the A1 pulley of right long and right ring finger August 2015

## 2014-02-06 NOTE — Progress Notes (Signed)
Patient ID: Alicia [name], female   DOB: 1955-11-27, 58 y.o.   MRN: [id number]  Alicia [name] - 58 y.o. female MRN [id number]  Date of birth: 1956/05/29    SUBJECTIVE:     Visit aid of an interpreter, sign language. She has previously had problems with arthritis in her thumb. Today she presents with a sticking sensation in her left thumb. He has been sticking for about the last month it has been extremely painful to him stick for the last week. She's having similar but not quite as severe problems on her right  long and ring fingers. She is deaf so she does a lot of sign language. This is really interfering with her ability to communicate and she's having a lot of pain and sticking sensations. ROS:     She's been otherwise well without any fever, sweats, chills. She's noted no erythema or warmth of any of her hand or wrist joints. No rash.  PERTINENT  PMH / PSH FH / / SH:  Past Medical, Surgical, Social, and Family History Reviewed & Updated in the EMR.  Pertinent findings include:  Deaf Diabetes mellitus History of CMP  arthritis some on the right  OBJECTIVE: BP 138/78  Ht 5\' 4"  (1.626 m)  Wt 135 lb (61.236 kg)  BMI 23.16 kg/m2  Physical Exam:  Vital signs are reviewed. GENERAL: Well-developed female no acute distress WRISTS: Bilaterally 4 range of motion, nontender, no erythema or warmth no swelling. THUMB:: Left. Triggering in flexion with palpable nodule felt at the tendons over the CMP. FINGERS: Right long finger and right ring finger both trigger in flexion and there is a palpable tender nodule felt at the A1 pulley area of each of these.  INJECTION: Patient was given informed consent, signed copy in the chart. Appropriate time out was taken. Area prepped and draped in usual sterile fashion. One half cc of methylprednisolone 40 mg/ml plus  one half cc of 1% lidocaine without epinephrine was injected into the A1 pulley area of the right long and right ring fingers using a(n)  perpendicular approach. The patient tolerated the procedure well. There were no complications. Post procedure instructions were given. INJECTION: Patient was given informed consent, signed copy in the chart. Appropriate time out was taken. Area prepped and draped in usual sterile fashion. One cc of methylprednisolone 40 mg/ml plus  one cc of 1% lidocaine without epinephrine was injected into the left thumb flexor tendon over the CMP joint area using a(n) perpendicular approach. The patient tolerated the procedure well. There were no complications. Post procedure instructions were given.    ASSESSMENT & PLAN:  See problem based charting & AVS for pt instructions.

## 2014-02-16 ENCOUNTER — Ambulatory Visit: Payer: Medicare Other | Admitting: Family Medicine

## 2014-03-05 ENCOUNTER — Other Ambulatory Visit: Payer: Self-pay | Admitting: *Deleted

## 2014-03-05 DIAGNOSIS — E7800 Pure hypercholesterolemia, unspecified: Secondary | ICD-10-CM

## 2014-03-06 MED ORDER — ROSUVASTATIN CALCIUM 10 MG PO TABS: ORAL_TABLET | ORAL | Status: DC

## 2014-03-13 ENCOUNTER — Ambulatory Visit (INDEPENDENT_AMBULATORY_CARE_PROVIDER_SITE_OTHER): Payer: Medicare Other | Admitting: Family Medicine

## 2014-03-13 VITALS — BP 126/65 | HR 69 | Temp 97.9°F | Ht 63.0 in | Wt 157.0 lb

## 2014-03-13 DIAGNOSIS — N289 Disorder of kidney and ureter, unspecified: Secondary | ICD-10-CM | POA: Diagnosis not present

## 2014-03-13 DIAGNOSIS — L600 Ingrowing nail: Secondary | ICD-10-CM | POA: Diagnosis not present

## 2014-03-13 DIAGNOSIS — M6530 Trigger finger, unspecified finger: Secondary | ICD-10-CM | POA: Diagnosis not present

## 2014-03-13 DIAGNOSIS — M7062 Trochanteric bursitis, left hip: Secondary | ICD-10-CM

## 2014-03-13 DIAGNOSIS — M79609 Pain in unspecified limb: Secondary | ICD-10-CM | POA: Diagnosis not present

## 2014-03-13 DIAGNOSIS — E1149 Type 2 diabetes mellitus with other diabetic neurological complication: Secondary | ICD-10-CM | POA: Diagnosis not present

## 2014-03-13 DIAGNOSIS — E11319 Type 2 diabetes mellitus with unspecified diabetic retinopathy without macular edema: Secondary | ICD-10-CM | POA: Diagnosis not present

## 2014-03-13 DIAGNOSIS — S2239XA Fracture of one rib, unspecified side, initial encounter for closed fracture: Secondary | ICD-10-CM | POA: Diagnosis not present

## 2014-03-13 DIAGNOSIS — IMO0002 Reserved for concepts with insufficient information to code with codable children: Secondary | ICD-10-CM | POA: Diagnosis not present

## 2014-03-13 DIAGNOSIS — K3184 Gastroparesis: Secondary | ICD-10-CM | POA: Diagnosis not present

## 2014-03-13 DIAGNOSIS — IMO0001 Reserved for inherently not codable concepts without codable children: Secondary | ICD-10-CM | POA: Diagnosis not present

## 2014-03-13 DIAGNOSIS — J029 Acute pharyngitis, unspecified: Secondary | ICD-10-CM | POA: Diagnosis not present

## 2014-03-13 DIAGNOSIS — E1139 Type 2 diabetes mellitus with other diabetic ophthalmic complication: Secondary | ICD-10-CM | POA: Diagnosis not present

## 2014-03-13 DIAGNOSIS — G5600 Carpal tunnel syndrome, unspecified upper limb: Secondary | ICD-10-CM | POA: Diagnosis not present

## 2014-03-13 DIAGNOSIS — M19049 Primary osteoarthritis, unspecified hand: Secondary | ICD-10-CM | POA: Diagnosis not present

## 2014-03-13 DIAGNOSIS — I10 Essential (primary) hypertension: Secondary | ICD-10-CM | POA: Diagnosis not present

## 2014-03-13 DIAGNOSIS — E119 Type 2 diabetes mellitus without complications: Secondary | ICD-10-CM | POA: Diagnosis not present

## 2014-03-13 DIAGNOSIS — R5381 Other malaise: Secondary | ICD-10-CM | POA: Diagnosis not present

## 2014-03-13 DIAGNOSIS — J069 Acute upper respiratory infection, unspecified: Secondary | ICD-10-CM | POA: Diagnosis not present

## 2014-03-13 DIAGNOSIS — E1165 Type 2 diabetes mellitus with hyperglycemia: Secondary | ICD-10-CM | POA: Diagnosis not present

## 2014-03-13 DIAGNOSIS — M76899 Other specified enthesopathies of unspecified lower limb, excluding foot: Secondary | ICD-10-CM

## 2014-03-13 DIAGNOSIS — E7800 Pure hypercholesterolemia, unspecified: Secondary | ICD-10-CM | POA: Diagnosis not present

## 2014-03-13 MED ORDER — METHYLPREDNISOLONE ACETATE 40 MG/ML IJ SUSP
40.0000 mg | Freq: Once | INTRAMUSCULAR | Status: AC
  Administered 2014-03-13: 40 mg via INTRA_ARTICULAR

## 2014-03-13 NOTE — Patient Instructions (Addendum)
Hip Bursitis Bursitis is a swelling and soreness (inflammation) of a fluid-filled sac (bursa). This sac overlies and protects the joints.  CAUSES   Injury.  Overuse of the muscles surrounding the joint.  Arthritis.  Gout.  Infection.  Cold weather.  Inadequate warm-up and conditioning prior to activities. The cause may not be known.  SYMPTOMS   Mild to severe irritation.  Tenderness and swelling over the outside of the hip.  Pain with motion of the hip.  If the bursa becomes infected, a fever may be present. Redness, tenderness, and warmth will develop over the hip. Symptoms usually lessen in 3 to 4 weeks with treatment, but can come back. TREATMENT If conservative treatment does not work, your caregiver may advise draining the bursa and injecting cortisone into the area. This may speed up the healing process. This may also be used as an initial treatment of choice. HOME CARE INSTRUCTIONS   Apply ice to the affected area for 15-20 minutes every 3 to 4 hours while awake for the first 2 days. Put the ice in a plastic bag and place a towel between the bag of ice and your skin.  Rest the painful joint as much as possible, but continue to put the joint through a normal range of motion at least 4 times per day. When the pain lessens, begin normal, slow movements and usual activities to help prevent stiffness of the hip.  Only take over-the-counter or prescription medicines for pain, discomfort, or fever as directed by your caregiver.  Use crutches to limit weight bearing on the hip joint, if advised.  Elevate your painful hip to reduce swelling. Use pillows for propping and cushioning your legs and hips.  Gentle massage may provide comfort and decrease swelling. SEEK IMMEDIATE MEDICAL CARE IF:   Your pain increases even during treatment, or you are not improving.  You have a fever.  You have heat and inflammation over the involved bursa.  You have any other questions or  concerns. MAKE SURE YOU:   Understand these instructions.  Will watch your condition.  Will get help right away if you are not doing well or get worse. Document Released: 12/09/2001 Document Revised: 09/11/2011 Document Reviewed: 07/08/2008 Exit[employer] Patient Information 2015 [employer]. This information is not intended to replace advice given to you by your health care provider. Make sure you discuss any questions you have with your health care provider.  

## 2014-03-13 NOTE — Progress Notes (Signed)
    Subjective   Alicia [name] is a 58 y.o. female that presents for a same day visit  1. Left hip pain: Started two weeks ago. Worse when bends over to sit on the floor and when getting up. Has used a heating pad, Back Aid Max, Aleve 1 BID, Advil 1 BID. Used a patch which helped (possibly Icy/Hot but is unsure). No history of trauma to the area.  History  Substance Use Topics  . Smoking status: Never Smoker   . Smokeless tobacco: Never Used  . Alcohol Use: Yes     Comment: rarely    ROS Per HPI  Objective   BP 126/65  Pulse 69  Temp(Src) 97.9 F (36.6 C) (Oral)  Ht 5\' 3"  (1.6 m)  Wt 157 lb (71.215 kg)  BMI 27.82 kg/m2  General: Well appearing female, in no acute distress Musculoskeletal: Significant point tenderness at left trochanter. No swelling or erythema. Negative straight leg raise. No abnormal gait.    TROCHANTERIC BURSA INJECTION  PROCEDURE: Trochanteric bursa injection REASON FOR PROCEDURE: trochanteric bursitis MEDICATIONS INJECTED: 1 mL of Depo-medrol and 4 mL of 1% lidocaine LOCAL ANESTHETIC INJECTED: None SEDATION MEDICATIONS: None ESTIMATED BLOOD LOSS: None COMPLICATIONS: None TECHNIQUE: Time-out was taken to identify the correct patient, procedure and side prior to starting the procedure. With the patient in the side-lying position, with the left side up, the patient was prepped with alcohol. A 25-gauge 1.5-inch needle was introduced to lateral thigh at maximal point of tenderness. Did not reach bone. Needle withdrawn a few mm and steroid/lidocaine injected into trochanter bursa. The needle was with withdrawn a few cm and introduced at a different angle and steroid/lidocaine injected again. Needle withdrawn and disposed. Area dressed with a Band-Aid. Patient tolerated procedure well.  Assessment and Plan   Please refer to problem based charting of assessment and plan

## 2014-03-14 DIAGNOSIS — M7062 Trochanteric bursitis, left hip: Secondary | ICD-10-CM | POA: Insufficient documentation

## 2014-03-14 NOTE — Assessment & Plan Note (Signed)
Trochanteric busra injection given. Uncomplicated. Patient to follow-up if symptoms worsen.

## 2014-03-20 ENCOUNTER — Encounter (HOSPITAL_COMMUNITY): Payer: Self-pay | Admitting: Emergency Medicine

## 2014-03-20 ENCOUNTER — Emergency Department (INDEPENDENT_AMBULATORY_CARE_PROVIDER_SITE_OTHER)
Admission: EM | Admit: 2014-03-20 | Discharge: 2014-03-20 | Disposition: A | Discharge status: Home / Self Care | Payer: Medicare Other | Source: Home / Self Care

## 2014-03-20 DIAGNOSIS — R143 Flatulence: Secondary | ICD-10-CM | POA: Diagnosis not present

## 2014-03-20 DIAGNOSIS — R141 Gas pain: Secondary | ICD-10-CM | POA: Diagnosis not present

## 2014-03-20 DIAGNOSIS — K3184 Gastroparesis: Secondary | ICD-10-CM

## 2014-03-20 DIAGNOSIS — E119 Type 2 diabetes mellitus without complications: Secondary | ICD-10-CM

## 2014-03-20 DIAGNOSIS — R142 Eructation: Secondary | ICD-10-CM

## 2014-03-20 LAB — POCT URINALYSIS DIP (DEVICE)
Bilirubin Urine: NEGATIVE
Glucose, UA: NEGATIVE mg/dL
Hgb urine dipstick: NEGATIVE
Ketones, ur: NEGATIVE mg/dL
Leukocytes, UA: NEGATIVE
Nitrite: NEGATIVE
Protein, ur: NEGATIVE mg/dL
Specific Gravity, Urine: 1.015 (ref 1.005–1.030)
Urobilinogen, UA: 0.2 mg/dL (ref 0.0–1.0)
pH: 7 (ref 5.0–8.0)

## 2014-03-20 LAB — GLUCOSE, CAPILLARY: Glucose-Capillary: 180 mg/dL — ABNORMAL HIGH (ref 70–99)

## 2014-03-20 MED ORDER — HYOSCYAMINE SULFATE 0.125 MG SL SUBL: SUBLINGUAL_TABLET | SUBLINGUAL | Status: DC

## 2014-03-20 NOTE — ED Provider Notes (Signed)
CSN: [id number]     Arrival date & time 03/20/14  1225 History   First MD Initiated Contact with Patient 03/20/14 1355     Chief Complaint  Patient presents with  . Abdominal Pain   (Consider location/radiation/quality/duration/timing/severity/associated sxs/prior Treatment) HPI Comments: 58 year old obese female with type 2 diabetes mellitus, history of GERD, and gastroparesis complaining of right lower quadrant pain. The pain is intermittent, and last for up to an hour before resolving. Often occurs after eating. Denies reflux symptoms. Fever, chills, chest pain, shortness of breath or upper abdominal pain.   Past Medical History  Diagnosis Date  . Diabetes mellitus   . Hypertension   . Hyperlipidemia   . Gastroparesis   . Diabetic neuropathy   . GERD (gastroesophageal reflux disease)   . Complete deafness     Meningitis at age 55  . S/P appy   . Deaf   . H/O: C-section   . Ganglion cyst 06/22/2011   Past Surgical History  Procedure Laterality Date  . Appendectomy    . Tubal ligation    . Cesarean section    . Uterine fibroid embolization  2007  . Cardiolyte ef 77%, no ischemia in 2006  2006  . Wrist surgery      Cyst removed on left  . Wrist surgery Right 1985    tendon repair R wrist   Family History  Problem Relation Age of Onset  . Hypertension Mother   . Cancer Maternal Aunt   . Cancer Maternal Grandmother   . Diabetes Father    History  Substance Use Topics  . Smoking status: Never Smoker   . Smokeless tobacco: Never Used  . Alcohol Use: Yes     Comment: rarely   OB History   Grav Para Term Preterm Abortions TAB SAB Ect Mult Living                 Review of Systems  Constitutional: Negative.   Respiratory: Negative.   Cardiovascular: Negative.   Gastrointestinal: Positive for abdominal pain. Negative for nausea, vomiting, diarrhea, constipation and abdominal distention.       Frequent normal stools this week up to 5/d. No diarrhea.   Genitourinary: Negative.   Skin: Negative.   Neurological: Negative.     Allergies  Sulfonamide derivatives; Lipitor; and Ramipril  Home Medications   Prior to Admission medications   Medication Sig Start Date End Date Taking? Authorizing Provider  aspirin ([employer] ASPIRIN) 81 MG chewable tablet Chew 81 mg by mouth daily.      Historical Provider, MD  Blood Glucose Monitoring Suppl (ACCU-CHEK AVIVA) device Use as instructed 12/03/13 12/03/14  Patrecia Pour, MD  Calcium Carbonate-Vitamin D (CALCIUM 600/VITAMIN D) 600-400 MG-UNIT per chew tablet Chew 1 tablet by mouth 2 (two) times daily.  11/04/10   Candelaria Celeste, MD  cholecalciferol (VITAMIN D) 1000 UNITS tablet Take 1,000 Units by mouth daily.    Historical Provider, MD  cyanocobalamin 500 MCG tablet Take 500 mcg by mouth daily.    Historical Provider, MD  diclofenac (VOLTAREN) 75 MG EC tablet Take 1 tablet (75 mg total) by mouth 2 (two) times daily. For 5 days then as needed 11/17/13   Gerda Diss, DO  Diclofenac Sodium 1 % CREA 1 application to painful joints qid 11/17/13   Gerda Diss, DO  DUREZOL 0.05 % [facility]  12/26/13   Historical Provider, MD  ferrous sulfate 325 (65 FE) MG tablet Take 1 tablet (325 mg total)  by mouth daily with breakfast. 10/01/10   Clovis Cao, MD  fluticasone (FLONASE) 50 MCG/ACT nasal spray 2 sprays to each nostril daily. Use nasal saline before use. 04/21/11   Shanda Howells, MD  gabapentin (NEURONTIN) 300 MG capsule Take 1 capsule (300 mg total) by mouth 3 (three) times daily. Take 1 cap twice daily and 2 caps at bedtime. 09/26/13   Patrecia Pour, MD  glucose blood (ACCU-CHEK AVIVA) test strip Check sugar 4x daily 12/03/13   Patrecia Pour, MD  hydrochlorothiazide (HYDRODIURIL) 50 MG tablet Take 1 tablet (50 mg total) by mouth daily. 01/29/14   Patrecia Pour, MD  hyoscyamine (LEVSIN/SL) 0.125 MG SL tablet Take one tablet every 6 hours only for persistent cramping 03/20/14   Janne Napoleon, NP  insulin aspart (NOVOLOG)  100 UNIT/ML injection Inject 1-3 Units into the skin 3 (three) times daily before meals. 04/29/13   Patrecia Pour, MD  insulin glargine (LANTUS) 100 UNIT/ML injection Inject 0.04 mLs (4 Units total) into the skin at bedtime. 05/16/13   Patrecia Pour, MD  Lancets (ACCU-CHEK SOFT TOUCH) lancets Use as instructed 12/03/13   Patrecia Pour, MD  LANTUS 100 UNIT/ML injection INJECT 6 UNITS SUBCUTANEOUSLY AT BEDTIME 05/16/13   Patrecia Pour, MD  losartan (COZAAR) 100 MG tablet TAKE 1 TABLET BY MOUTH EVERY DAY    Patrecia Pour, MD  metFORMIN (GLUCOPHAGE) 1000 MG tablet TAKE 1 TABLET BY MOUTH TWO TIMES DAILY. 10/20/13   Patrecia Pour, MD  metoCLOPramide (REGLAN) 10 MG tablet Take 1 tablet (10 mg total) by mouth 4 (four) times daily. 01/29/14   Patrecia Pour, MD  metoprolol succinate (TOPROL-XL) 50 MG 24 hr tablet TAKE 1 TABLET BY MOUTH EVERY DAY *TAKE WITH OR IMMEDIATELY FOLLOWING A MEAL 04/27/13   Patrecia Pour, MD  NOVOLOG 100 UNIT/ML injection TAKE AS DIRECTED PER GLUCOSE: 150-200 GIVE 2UNITS, IF 201-250 GIVE 3UNITS >250 GIVE 4 UNITS    Patrecia Pour, MD  polyethylene glycol powder (GLYCOLAX/MIRALAX) powder Take 17 g by mouth 3 (three) times a week. 01/24/13   Zigmund Gottron, MD  rosuvastatin (CRESTOR) 10 MG tablet TAKE 1 TABLET BY MOUTH EVERY DAY 03/06/14   Patrecia Pour, MD   BP 126/75  Pulse 75  Temp(Src) 97.7 F (36.5 C) (Oral)  Resp 16  SpO2 97% Physical Exam  Nursing note and vitals reviewed. Constitutional: She is oriented to person, place, and time. She appears well-developed and well-nourished. No distress.  Neck: Normal range of motion. Neck supple.  Cardiovascular: Normal rate, regular rhythm and normal heart sounds.   Pulmonary/Chest: Effort normal and breath sounds normal. No respiratory distress.  Abdominal: Soft. Bowel sounds are normal. She exhibits no mass. There is no tenderness. There is no rebound and no guarding.  Percusses tympanic all quadrants  Musculoskeletal: She exhibits no edema.   Neurological: She is alert and oriented to person, place, and time.  Skin: Skin is warm and dry.  Psychiatric: She has a normal mood and affect.    ED Course  Procedures (including critical care time) Labs Review Labs Reviewed  GLUCOSE, CAPILLARY - Abnormal; Notable for the following:    Glucose-Capillary 180 (*)    All other components within normal limits  POCT URINALYSIS DIP (DEVICE)    Imaging Review No results found.   MDM   1. Abdominal gas pain   2. Gastroparesis   3. Type 2 diabetes mellitus without complication  D/C in stable condition. No distress. Doubt surgical abdomen. No current pain or tenderness. Asymptomatic. Continue Miralax and other meds.  Try Levsin for more frequent or severe cramping and f/u with PCP.     Janne Napoleon, NP 03/20/14 [phone]

## 2014-03-20 NOTE — Discharge Instructions (Signed)
Abdominal Pain °Many things can cause abdominal pain. Usually, abdominal pain is not caused by a disease and will improve without treatment. It can often be observed and treated at home. Your health care provider will do a physical exam and possibly order blood tests and X-rays to help determine the seriousness of your pain. However, in many cases, more time must pass before a clear cause of the pain can be found. Before that point, your health care provider may not know if you need more testing or further treatment. °HOME CARE INSTRUCTIONS  °Monitor your abdominal pain for any changes. The following actions may help to alleviate any discomfort you are experiencing: °· Only take over-the-counter or prescription medicines as directed by your health care provider. °· Do not take laxatives unless directed to do so by your health care provider. °· Try a clear liquid diet (broth, tea, or water) as directed by your health care provider. Slowly move to a bland diet as tolerated. °SEEK MEDICAL CARE IF: °· You have unexplained abdominal pain. °· You have abdominal pain associated with nausea or diarrhea. °· You have pain when you urinate or have a bowel movement. °· You experience abdominal pain that wakes you in the night. °· You have abdominal pain that is worsened or improved by eating food. °· You have abdominal pain that is worsened with eating fatty foods. °· You have a fever. °SEEK IMMEDIATE MEDICAL CARE IF:  °· Your pain does not go away within 2 hours. °· You keep throwing up (vomiting). °· Your pain is felt only in portions of the abdomen, such as the right side or the left lower portion of the abdomen. °· You pass bloody or black tarry stools. °MAKE SURE YOU: °· Understand these instructions.   °· Will watch your condition.   °· Will get help right away if you are not doing well or get worse.   °Document Released: 03/29/2005 Document Revised: 06/24/2013 Document Reviewed: 02/26/2013 °[employer]® Patient Information  ©2015 [employer]. This information is not intended to replace advice given to you by your health care provider. Make sure you discuss any questions you have with your health care provider. ° °Bloating °Bloating is the feeling of fullness in your belly. You may feel as though your pants are too tight. Often the cause of bloating is overeating, retaining fluids, or having gas in your bowel. It is also caused by swallowing air and eating foods that cause gas. Irritable bowel syndrome is one of the most common causes of bloating. Constipation is also a common cause. Sometimes more serious problems can cause bloating. °SYMPTOMS  °Usually there is a feeling of fullness, as though your abdomen is bulged out. There may be mild discomfort.  °DIAGNOSIS  °Usually no particular testing is necessary for most bloating. If the condition persists and seems to become worse, your caregiver may do additional testing.  °TREATMENT  °· There is no direct treatment for bloating. °· Do not put gas into the bowel. Avoid chewing gum and sucking on candy. These tend to make you swallow air. Swallowing air can also be a nervous habit. Try to avoid this. °· Avoiding high residue diets will help. Eat foods with soluble fibers (examples include root vegetables, apples, or barley) and substitute dairy products with soy and rice products. This helps irritable bowel syndrome. °· If constipation is the cause, then a high residue diet with more fiber will help. °· Avoid carbonated beverages. °· Over-the-counter preparations are available that help reduce gas. Your   pharmacist can help you with this. SEEK MEDICAL CARE IF:   Bloating continues and seems to be getting worse.  You notice a weight gain.  You have a weight loss but the bloating is getting worse.  You have changes in your bowel habits or develop nausea or vomiting. SEEK IMMEDIATE MEDICAL CARE IF:   You develop shortness of breath or swelling in your legs.  You have an  increase in abdominal pain or develop chest pain. Document Released: 04/19/2006 Document Revised: 09/11/2011 Document Reviewed: 06/07/2007 [facility] Patient Information 2015 [place], [place]. This information is not intended to replace advice given to you by your health care provider. Make sure you discuss any questions you have with your health care provider.  Gastroparesis  Gastroparesis is also called slowed stomach emptying (delayed gastric emptying). It is a condition in which the stomach takes too long to empty its contents. It often happens in people with diabetes.  CAUSES  Gastroparesis happens when nerves to the stomach are damaged or stop working. When the nerves are damaged, the muscles of the stomach and intestines do not work normally. The movement of food is slowed or stopped. High blood glucose (sugar) causes changes in nerves and can damage the blood vessels that carry oxygen and nutrients to the nerves. RISK FACTORS  Diabetes.  Post-viral syndromes.  Eating disorders (anorexia, bulimia).  Surgery on the stomach or vagus nerve.  Gastroesophageal reflux disease (rarely).  Smooth muscle disorders (amyloidosis, scleroderma).  Metabolic disorders, including hypothyroidism.  Parkinson disease. SYMPTOMS   Heartburn.  Feeling sick to your stomach (nausea).  Vomiting of undigested food.  An early feeling of fullness when eating.  Weight loss.  Abdominal bloating.  Erratic blood glucose levels.  Lack of appetite.  Gastroesophageal reflux.  Spasms of the stomach wall. Complications can include:  Bacterial overgrowth in stomach. Food stays in the stomach and can ferment and cause bacteria to grow.  Weight loss due to difficulty digesting and absorbing nutrients.  Vomiting.  Obstruction in the stomach. Undigested food can harden and cause nausea and vomiting.  Blood glucose fluctuations caused by inconsistent food absorption. DIAGNOSIS  The diagnosis of  gastroparesis is confirmed through one or more of the following tests:  Barium X-rays and scans. These tests look at how long it takes for food to move through the stomach.  Gastric manometry. This test measures electrical and muscular activity in the stomach. A thin tube is passed down the throat into the stomach. The tube contains a wire that takes measurements of the stomach's electrical and muscular activity as it digests liquids and solid food.  Endoscopy. This procedure is done with a long, thin tube called an endoscope. It is passed through the mouth and gently down the esophagus into the stomach. This tube helps the caregiver look at the lining of the stomach to check for any abnormalities.  Ultrasonography. This can rule out gallbladder disease or pancreatitis. This test will outline and define the shape of the gallbladder and pancreas. TREATMENT   Treatments may include:  Exercise.  Medicines to control nausea and vomiting.  Medicines to stimulate stomach muscles.  Changes in what and when you eat.  Having smaller meals more often.  Eating low-fiber forms of high-fiber foods, such as eating cooked vegetables instead of raw vegetables.  Eating low-fat foods.  Consuming liquids, which are easier to digest.  In severe cases, feeding tubes and intravenous (IV) feeding may be needed. It is important to note that in most cases,  treatment does not cure gastroparesis. It is usually a lasting (chronic) condition. Treatment helps you manage the underlying condition so that you can be as healthy and comfortable as possible. Other treatments  A gastric neurostimulator has been developed to assist people with gastroparesis. The battery-operated device is surgically implanted. It emits mild electrical pulses to help improve stomach emptying and to control nausea and vomiting.  The use of botulinum toxin has been shown to improve stomach emptying by decreasing the prolonged contractions  of the muscle between the stomach and the small intestine (pyloric sphincter). The benefits are temporary. SEEK MEDICAL CARE IF:   You have diabetes and you are having problems keeping your blood glucose in goal range.  You are having nausea, vomiting, bloating, or early feelings of fullness with eating.  Your symptoms do not change with a change in diet. Document Released: 06/19/2005 Document Revised: 10/14/2012 Document Reviewed: 11/26/2008 [facility] Patient Information 2015 [place], [place]. This information is not intended to replace advice given to you by your health care provider. Make sure you discuss any questions you have with your health care provider.

## 2014-03-20 NOTE — ED Provider Notes (Signed)
Medical screening examination/treatment/procedure(s) were performed by resident physician or non-physician practitioner and as supervising physician I was immediately available for consultation/collaboration.   Pauline Good MD.   Billy Fischer, MD 03/20/14 [phone]

## 2014-03-24 ENCOUNTER — Other Ambulatory Visit: Payer: Self-pay | Admitting: *Deleted

## 2014-03-25 MED ORDER — GABAPENTIN 300 MG PO CAPS: 300.0000 mg | ORAL_CAPSULE | Freq: Three times a day (TID) | ORAL | Status: DC

## 2014-03-26 ENCOUNTER — Telehealth: Payer: Self-pay | Admitting: Family Medicine

## 2014-03-26 NOTE — Telephone Encounter (Signed)
Sign language patient. Will try to contact later on this afternoon when patient gets home

## 2014-03-26 NOTE — Telephone Encounter (Signed)
Pt is in bad pain in her back She leaves for work at 9:30

## 2014-03-27 ENCOUNTER — Ambulatory Visit (INDEPENDENT_AMBULATORY_CARE_PROVIDER_SITE_OTHER): Payer: Medicare Other | Admitting: Family Medicine

## 2014-03-27 ENCOUNTER — Encounter: Payer: Self-pay | Admitting: Family Medicine

## 2014-03-27 VITALS — BP 139/77 | HR 73 | Temp 97.6°F | Wt 155.0 lb

## 2014-03-27 DIAGNOSIS — X503XXA Overexertion from repetitive movements, initial encounter: Secondary | ICD-10-CM | POA: Insufficient documentation

## 2014-03-27 DIAGNOSIS — S39012D Strain of muscle, fascia and tendon of lower back, subsequent encounter: Secondary | ICD-10-CM

## 2014-03-27 DIAGNOSIS — Z5189 Encounter for other specified aftercare: Secondary | ICD-10-CM | POA: Diagnosis not present

## 2014-03-27 DIAGNOSIS — IMO0002 Reserved for concepts with insufficient information to code with codable children: Secondary | ICD-10-CM | POA: Diagnosis not present

## 2014-03-27 DIAGNOSIS — X503XXD Overexertion from repetitive movements, subsequent encounter: Secondary | ICD-10-CM

## 2014-03-27 DIAGNOSIS — S46912D Strain of unspecified muscle, fascia and tendon at shoulder and upper arm level, left arm, subsequent encounter: Secondary | ICD-10-CM

## 2014-03-27 DIAGNOSIS — S336XXA Sprain of sacroiliac joint, initial encounter: Secondary | ICD-10-CM | POA: Diagnosis not present

## 2014-03-27 DIAGNOSIS — S39012A Strain of muscle, fascia and tendon of lower back, initial encounter: Secondary | ICD-10-CM | POA: Insufficient documentation

## 2014-03-27 DIAGNOSIS — S46912A Strain of unspecified muscle, fascia and tendon at shoulder and upper arm level, left arm, initial encounter: Secondary | ICD-10-CM | POA: Insufficient documentation

## 2014-03-27 MED ORDER — TRAMADOL HCL 50 MG PO TABS: 50.0000 mg | ORAL_TABLET | Freq: Three times a day (TID) | ORAL | Status: DC | PRN

## 2014-03-27 MED ORDER — IBUPROFEN 600 MG PO TABS: 600.0000 mg | ORAL_TABLET | Freq: Four times a day (QID) | ORAL | Status: DC

## 2014-03-27 NOTE — Telephone Encounter (Signed)
Pt stated she is having lower back pain and left arm pain.  Pt advised she should go to urgent care or ED; pt opt for an appt this afternoon with PCP.  Appt today at 3:45 PM. Derl Barrow, RN'

## 2014-03-27 NOTE — Progress Notes (Signed)
Patient ID: Alicia [name], female   DOB: 05/30/1956, 58 y.o.   MRN: [id number]   Subjective:  Alicia [name] is a 58 y.o. female here for recent onset left shoulder pain and lower back pain.  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 No pop and pain quickly dissipates when not being used. No ecchymosis. She is right hand-dominant.   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 Pain is moderate-severe, non-radiating and was not helped by the injection on 9/11. Pt denies any current bowel/bladder problems, fever, chills, unintentional weight loss, night time awakenings secondary to pain, weakness in one or both legs  Diabetes: sugars have been elevated following steroid injection Nonsmoker  All other pertinent systems reviewed and are negative. Objective:  BP 139/77  Pulse 73  Temp(Src) 97.6 F (36.4 C) (Oral)  Wt 155 lb (70.308 kg)  Gen:  58 y.o. female in NAD MSK: Left shoulder: no deformity, ecchymosis, empty can test negative, no impingement. Full internal and external rotation with some pain on resisted abduction (strength still 5/5). No point tenderness along joint line.  Back:  Normal skin. Spine with normal alignment and no deformity. No tenderness to vertebral process palpation. Paraspinous muscles are not tender and without spasm. Range of motion is full at neck and lumbar sacral regions. Full flexion at the hip reproduces some pain. Straight leg raise is negative. Neuro:  Sensation and motor function 5/5 bilateral lower extremities. Patellar and achilles DTR's 2+  Assessment & Plan:  Alicia [name] is a 58 y.o. female with:  Problem List Items Addressed This Visit     Musculoskeletal and Integument   Repetitive strain injury of lower back - Primary     Separate from trochanteric bursitis with no back pain red flags or indications for imaging at this  time - though would consider MRI LS spine if this continues. Anti-inflammatory around the clock and rest, as able and ice.     Relevant Medications      traMADol (ULTRAM) tablet 50 mg      ibuprofen (MOTRIN) tablet   Muscle strain of left shoulder     No signs of rotator cuff pathology or dislocation. Suspect muscle strain due to character and mechanism. Treat with anti-inflammatory for 6-8 weeks. Otherwise rest, ice.     Relevant Medications      traMADol (ULTRAM) tablet 50 mg      ibuprofen (MOTRIN) tablet

## 2014-03-27 NOTE — Telephone Encounter (Signed)
Pt called again and would like to talk to the nurse. She feels like this is an emergency. jw

## 2014-03-27 NOTE — Assessment & Plan Note (Signed)
No signs of rotator cuff pathology or dislocation. Suspect muscle strain due to character and mechanism. Treat with anti-inflammatory for 6-8 weeks. Otherwise rest, ice.

## 2014-03-27 NOTE — Assessment & Plan Note (Signed)
Separate from trochanteric bursitis with no back pain red flags or indications for imaging at this time - though would consider MRI LS spine if this continues. Anti-inflammatory around the clock and rest, as able and ice.

## 2014-03-27 NOTE — Patient Instructions (Signed)
-   Take ibuprofen 600mg  (3 tablets) EVERY 6 hours while awake to decrease overall inflammation - Take ultram 50mg  every 8 hours AS NEEDED for breakthrough pain - If this continues/worsens over the next 6-8 weeks we should consider getting an MRI of your back.

## 2014-04-15 ENCOUNTER — Ambulatory Visit (INDEPENDENT_AMBULATORY_CARE_PROVIDER_SITE_OTHER): Payer: Medicare Other | Admitting: *Deleted

## 2014-04-15 DIAGNOSIS — Z23 Encounter for immunization: Secondary | ICD-10-CM

## 2014-04-16 ENCOUNTER — Other Ambulatory Visit: Payer: Self-pay | Admitting: Family Medicine

## 2014-04-21 ENCOUNTER — Other Ambulatory Visit: Payer: Self-pay | Admitting: Family Medicine

## 2014-04-24 ENCOUNTER — Other Ambulatory Visit: Payer: Self-pay | Admitting: Family Medicine

## 2014-04-27 ENCOUNTER — Ambulatory Visit (INDEPENDENT_AMBULATORY_CARE_PROVIDER_SITE_OTHER): Payer: Medicare Other | Admitting: Ophthalmology

## 2014-05-06 ENCOUNTER — Other Ambulatory Visit: Payer: Self-pay | Admitting: Family Medicine

## 2014-05-11 ENCOUNTER — Ambulatory Visit
Admission: RE | Admit: 2014-05-11 | Discharge: 2014-05-11 | Disposition: A | Discharge status: Home / Self Care | Payer: Medicare Other | Source: Ambulatory Visit | Attending: Family Medicine | Admitting: Family Medicine

## 2014-05-11 ENCOUNTER — Ambulatory Visit (INDEPENDENT_AMBULATORY_CARE_PROVIDER_SITE_OTHER): Payer: Medicare Other | Admitting: Family Medicine

## 2014-05-11 ENCOUNTER — Encounter: Payer: Self-pay | Admitting: Family Medicine

## 2014-05-11 VITALS — BP 136/60 | HR 76 | Ht 63.0 in | Wt 163.0 lb

## 2014-05-11 DIAGNOSIS — M79671 Pain in right foot: Secondary | ICD-10-CM

## 2014-05-11 DIAGNOSIS — E118 Type 2 diabetes mellitus with unspecified complications: Secondary | ICD-10-CM | POA: Diagnosis not present

## 2014-05-11 LAB — POCT GLYCOSYLATED HEMOGLOBIN (HGB A1C): Hemoglobin A1C: 9.4

## 2014-05-11 NOTE — Progress Notes (Signed)
   Subjective:    Patient ID: Alicia [name], female    DOB: [birth year], 58 y.o.   MRN: [id number]  HPI  Right foot pain: cannot remember when started but has had for a long period of time.  Takes ibuprofen prn but does not feel it helps, has not tried tramadol although she does have some.  DM: has appt for eye exam this month, => lantus 6uqHS and novolog 1-3u prn TID - fasting glucose usually > 200  Review of Systems  Constitutional: Negative for chills and diaphoresis.  Respiratory: Negative for cough and shortness of breath.   Cardiovascular: Negative for leg swelling.  Gastrointestinal: Positive for nausea and abdominal distention. Negative for abdominal pain, diarrhea, constipation and anal bleeding.  Endocrine: Negative for cold intolerance and heat intolerance.  Genitourinary: Negative for dysuria, urgency, decreased urine volume and difficulty urinating.  Musculoskeletal:       Right foot pain  Neurological: Negative for headaches.       Objective:   Physical Exam  Constitutional: She appears well-developed and well-nourished. No distress.  HENT:  Head: Normocephalic and atraumatic.  Eyes: Conjunctivae are normal. Pupils are equal, round, and reactive to light.  Neck: Normal range of motion. Neck supple. No thyromegaly present.  Cardiovascular: Normal rate.   Pulmonary/Chest: Effort normal. No respiratory distress.  Musculoskeletal: She exhibits no edema.       Feet:  Skin: Skin is warm and dry. No rash noted. She is not diaphoretic. No erythema.  Psychiatric: She has a normal mood and affect. Her behavior is normal.          Assessment & Plan:  Right foot pain: Bunionette suspected, will obtain xray right foot to rule out any other abnormalities. - advised naproxen 220 BID x 2 weeks - xray, refer to podiatrist if indicated  DM: A1c 9.4 on lantus 6u qHS and Novolog 1-3 TID prn - increase to lantus 10 qHS and follow up in 1-2 weeks with glucose log (does not  have today).  Did not advise patient to increase as she was previously advised to increase to fasting glucose of 150. - f/u at next visit  Merla Riches, MD  Sign language translator used for entirety of exam/interview

## 2014-05-13 ENCOUNTER — Ambulatory Visit (INDEPENDENT_AMBULATORY_CARE_PROVIDER_SITE_OTHER): Payer: Medicare Other | Admitting: Ophthalmology

## 2014-05-13 DIAGNOSIS — E11311 Type 2 diabetes mellitus with unspecified diabetic retinopathy with macular edema: Secondary | ICD-10-CM

## 2014-05-13 DIAGNOSIS — H35033 Hypertensive retinopathy, bilateral: Secondary | ICD-10-CM | POA: Diagnosis not present

## 2014-05-13 DIAGNOSIS — E11351 Type 2 diabetes mellitus with proliferative diabetic retinopathy with macular edema: Secondary | ICD-10-CM | POA: Diagnosis not present

## 2014-05-13 DIAGNOSIS — I10 Essential (primary) hypertension: Secondary | ICD-10-CM | POA: Diagnosis not present

## 2014-05-13 DIAGNOSIS — H43813 Vitreous degeneration, bilateral: Secondary | ICD-10-CM

## 2014-05-22 ENCOUNTER — Other Ambulatory Visit: Payer: Self-pay | Admitting: Family Medicine

## 2014-05-25 MED ORDER — INSULIN GLARGINE 100 UNIT/ML ~~LOC~~ SOLN: SUBCUTANEOUS | Status: DC

## 2014-05-25 NOTE — Addendum Note (Signed)
Addended by: [place] Pink on: 05/25/2014 01:41 PM   Modules accepted: Orders

## 2014-05-25 NOTE — Addendum Note (Signed)
Addended by: Vance Gather B on: 05/25/2014 01:47 PM   Modules accepted: Orders

## 2014-05-25 NOTE — Telephone Encounter (Signed)
Pt called and needs a refill on her Latus called in to the [facility] on [address]. jw

## 2014-05-26 ENCOUNTER — Telehealth: Payer: Self-pay | Admitting: Family Medicine

## 2014-05-26 ENCOUNTER — Other Ambulatory Visit: Payer: Self-pay | Admitting: *Deleted

## 2014-05-26 MED ORDER — INSULIN GLARGINE 100 UNIT/ML ~~LOC~~ SOLN: SUBCUTANEOUS | Status: DC

## 2014-05-26 NOTE — Telephone Encounter (Signed)
Pt seen by Deniece Ree on 05/11/14 and had xrays done on foot. Never got the results. Would like to speak to nurse/MD about results. Please call [phone], preferably tomorrow morning.

## 2014-05-26 NOTE — Telephone Encounter (Signed)
Will forward to Dr. Deniece Ree to inform us of results to call patient. Shanah Guimaraes,CMA

## 2014-05-27 ENCOUNTER — Encounter: Payer: Self-pay | Admitting: Family Medicine

## 2014-05-27 NOTE — Progress Notes (Unsigned)
Patient wants to know the results of her TBT Test. Please follow up with Patient at [phone] (Interpreter's phone number)

## 2014-05-30 DIAGNOSIS — E11329 Type 2 diabetes mellitus with mild nonproliferative diabetic retinopathy without macular edema: Secondary | ICD-10-CM | POA: Diagnosis not present

## 2014-05-30 DIAGNOSIS — H40033 Anatomical narrow angle, bilateral: Secondary | ICD-10-CM | POA: Diagnosis not present

## 2014-06-01 ENCOUNTER — Encounter: Payer: Self-pay | Admitting: Family Medicine

## 2014-06-01 ENCOUNTER — Ambulatory Visit (INDEPENDENT_AMBULATORY_CARE_PROVIDER_SITE_OTHER): Payer: Medicare Other | Admitting: Family Medicine

## 2014-06-01 VITALS — BP 135/58 | HR 81 | Temp 97.7°F | Ht 63.0 in | Wt 162.5 lb

## 2014-06-01 DIAGNOSIS — K3184 Gastroparesis: Secondary | ICD-10-CM | POA: Diagnosis not present

## 2014-06-01 DIAGNOSIS — E118 Type 2 diabetes mellitus with unspecified complications: Secondary | ICD-10-CM

## 2014-06-01 DIAGNOSIS — S46912D Strain of unspecified muscle, fascia and tendon at shoulder and upper arm level, left arm, subsequent encounter: Secondary | ICD-10-CM | POA: Diagnosis not present

## 2014-06-01 DIAGNOSIS — E1142 Type 2 diabetes mellitus with diabetic polyneuropathy: Secondary | ICD-10-CM

## 2014-06-01 MED ORDER — METOCLOPRAMIDE HCL 10 MG PO TABS: 10.0000 mg | ORAL_TABLET | Freq: Three times a day (TID) | ORAL | Status: DC

## 2014-06-01 MED ORDER — GABAPENTIN 300 MG PO CAPS: 300.0000 mg | ORAL_CAPSULE | Freq: Three times a day (TID) | ORAL | Status: DC

## 2014-06-01 MED ORDER — MELOXICAM 15 MG PO TABS: 15.0000 mg | ORAL_TABLET | Freq: Every day | ORAL | Status: DC

## 2014-06-01 NOTE — Assessment & Plan Note (Signed)
Longstanding history, related to diabetic neuropathy. Has been taking reglan 10mg  once per day with continued symptoms. Advised to take this 3 times daily before meals.

## 2014-06-01 NOTE — Assessment & Plan Note (Signed)
Type II diabetes mellitus: Hb A1c: 9.4%. Drastically elevated compared to prior records dating to 2008 with evidence of end-organ damage.  - continue increased Lantus 10u, titrate to keep AM CBG 80-150 - Creatinine stable, continue ARB - Continue ASA daily - Continue statin - Follow up in 3 months to include Hb A1c, and foot exam. - Eye exam is up to date.  - Foot exam today was good. Low risk for diabetic foot ulcer/infection.

## 2014-06-01 NOTE — Patient Instructions (Addendum)
It was great to see you again Alicia [name].   - Continue Stay on 10 u as we discussed. If your morning blood sugars are above 150mg /dl every day increase 1 unit and if you are below 80 for 3 days in a row decrease 1 unit.   - Let's increase gabapentin for your neuropathy (foot pain): Take gabapentin (neurontin) 2 capsules in the morning, 2 in the afternoon and 3 at night.   - For pain in general and for your shoulder in specific STOP taking aleve and START taking mobic (meloxicam) once a day  - Restart reglan taking it 3 times per day before meals for your stomach.   Thank you! - Dr Bonner Puna

## 2014-06-01 NOTE — Progress Notes (Deleted)
80-200, most under 150.  Chenage from 6 u to 10 u.

## 2014-06-01 NOTE — Progress Notes (Signed)
Patient ID: HALIEGH KHURANA, female   DOB: 01/29/1956, 58 y.o.   MRN: [id number]   Subjective:  ELINOR KLEINE is a 58 y.o. female here for diabetes follow up.   - Pt is deaf and encounter was facilitated by speech language interpretor.  - Checks blood sugar at home and provides written log. Reports average range 80-200, most under 150 after change from 6 u to 10 u.Denies any hypoglycemia or symptoms. Takes medications about 7 days out of an average week.  - Last eye exam: 08/30/2012 - Flu and pneumovax: UTD  - Review of Systems: Denies fever, chills, weight loss, dizziness, vision changes, syncope, polyuria, nocturia, paresthesias, polydipsia, chest pain, new wounds. Otherwise negative. - Rachel: T2DM with retinopathy, gastroparesis and neuropathy, HLD, deafness, osteoarthritis - Smoking status: Nonsmoker - EtOH: None  - Medications: reviewed and updated Objective:  BP 135/58 mmHg  Pulse 81  Temp(Src) 97.7 F (36.5 C) (Oral)  Ht 5\' 3"  (1.6 m)  Wt 162 lb 8 oz (73.71 kg)  BMI 28.79 kg/m2 Gen: Well-appearing 58 y.o.female in NAD HEENT: Normocephalic, sclerae/conjunctivae clear, PERRL, MMM, posterior oropharynx clear, good dentition. Deaf.  Neck: neck supple, no masses appreciated; thyroid not enlarged  Pulm: Non-labored; CTAB, no wheezes  CV: Regular rate, no murmur appreciated; no LE edema, no JVD GI: Normoactive BS; soft, non-tender, non-distended, no HSM Skin: No wounds or rashes, acanthosis not appreciated SEE [employer] FOR FOOT EXAM Neuro: Alert and oriented, sensation intact to light touch, steady gait. Assessment:  YAILYN STRACK is a 58 y.o. female here for diabetes.   Plan:  See problem list for problem-specific plans.

## 2014-06-01 NOTE — Assessment & Plan Note (Signed)
Increase gabapentin to 2 capsules in the morning, 2 in the afternoon and 3 at night. Warned of drowsiness.

## 2014-06-01 NOTE — Assessment & Plan Note (Signed)
Stable. 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 She recommends MRI to the patient, though the note only expresses directions to "refer to ER to rule out underlying etiology."  - Please see letter dated today , 06/01/2014, which will be sent to this provider. After discussion with patient and preceptor, will defer MRI for now given no new focal neurological symptoms or prominent sings/symptoms of mass effect.

## 2014-06-01 NOTE — Assessment & Plan Note (Signed)
Advised mobic and exercises provided.

## 2014-06-08 ENCOUNTER — Ambulatory Visit (INDEPENDENT_AMBULATORY_CARE_PROVIDER_SITE_OTHER): Payer: Medicare Other | Admitting: Family Medicine

## 2014-06-08 ENCOUNTER — Encounter: Payer: Self-pay | Admitting: Family Medicine

## 2014-06-08 VITALS — BP 156/86 | HR 101 | Temp 98.0°F | Ht 63.0 in | Wt 162.0 lb

## 2014-06-08 DIAGNOSIS — E118 Type 2 diabetes mellitus with unspecified complications: Secondary | ICD-10-CM

## 2014-06-08 DIAGNOSIS — R569 Unspecified convulsions: Secondary | ICD-10-CM

## 2014-06-08 DIAGNOSIS — S29011A Strain of muscle and tendon of front wall of thorax, initial encounter: Secondary | ICD-10-CM | POA: Insufficient documentation

## 2014-06-08 DIAGNOSIS — R0781 Pleurodynia: Secondary | ICD-10-CM | POA: Diagnosis not present

## 2014-06-08 MED ORDER — OXYCODONE-ACETAMINOPHEN 5-325 MG PO TABS
0.5000 | ORAL_TABLET | Freq: Two times a day (BID) | ORAL | Status: DC | PRN
Start: 2014-06-08 — End: 2015-04-14
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
## 2014-06-08 NOTE — Patient Instructions (Signed)
Thank you for coming in, today!  For your blood sugar: Go back to taking the Lantus 6 units daily Keep taking Novolog the same Don't change any other medicines, for now  For your rib pain: Avoid anything that causes it to hurt (lifting, pushing, etc) I will write you a note that says you should stay out of work at least until next Thursday when you come back to see Dr. Bonner Puna (12/17) You can take Percocet (oxycodone-acetaminophen) for severe pain Take half a tablet to start with because it might make you very sleepy DO NOT take it any drive or operate any machinery You can take up to a full tablet twice a day as needed  Come back to see Dr. Bonner Puna next week If your sugar drops that low again especially if you have more seizures, come in to the emergency room Please feel free to call with any questions or concerns at any time, at [phone]. --Dr. Venetia Maxon

## 2014-06-15 ENCOUNTER — Ambulatory Visit (INDEPENDENT_AMBULATORY_CARE_PROVIDER_SITE_OTHER): Payer: Medicare Other | Admitting: Family Medicine

## 2014-06-15 ENCOUNTER — Encounter: Payer: Self-pay | Admitting: Family Medicine

## 2014-06-15 VITALS — BP 136/56 | HR 80 | Temp 97.9°F | Ht 63.0 in | Wt 164.9 lb

## 2014-06-15 DIAGNOSIS — S29011A Strain of muscle and tendon of front wall of thorax, initial encounter: Secondary | ICD-10-CM

## 2014-06-15 MED ORDER — DICLOFENAC SODIUM 75 MG PO TBEC: 75.0000 mg | DELAYED_RELEASE_TABLET | Freq: Two times a day (BID) | ORAL | Status: DC | PRN

## 2014-06-15 NOTE — Progress Notes (Signed)
   Subjective:  Alicia [name] is a 58 y.o. female presenting to same day clinic for left sided chest pain.  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 It is dull on the left side of her chest, moderate but tolerable. Nothing tried. Not related to exertion or breathing. Pain is only present when flexing arm forward (e.g. picking up kids, turning steering wheel) No pain in the arm or hand, no shooting pain.   Denies fever, headache, cough, CP, palpitations, SOB, abdominal pain, N/V/D, blood in stool, change in bladder habits, myalgias, arthralgias, and rash.   - Review of Systems: Per HPI.  - Non-smoker  Objective:  BP 136/56 mmHg  Pulse 80  Temp(Src) 97.9 F (36.6 C) (Oral)  Ht 5\' 3"  (1.6 m)  Wt 164 lb 14.4 oz (74.798 kg)  BMI 29.22 kg/m2 Gen:  57 y.o. female in no distress MSK: Left shoulder with full AROM, point tenderness over origin and insertion of left pectoralis major muscle, pain with shoulder flexion against resistance and pushing hand away against resistance. No abnormalities on right sided examination.   Assessment/Plan:  Alicia [name] is a 58 y.o. female here for pectoralis muscle strain.   Problem List Items Addressed This Visit      Musculoskeletal and Integument   Strain of left pectoralis muscle - Primary    Secondary to hypoglycemic seizure. No significant tear suspected.  Tx: NSAIDs prn pain, exercises as tolerated

## 2014-06-15 NOTE — Patient Instructions (Signed)
I think you have a strain of the pectoralis major muscle. This will go away on its own with rest and time. Try to take it easy between now at the 18th when you go back to work. You can take the oxycodone for pain or the voltaren which I have sent to your pharmacy. This will make you less drowsy so you can take it as needed if you are in pain at work.   Continue your insulin as you're taking it and we'll follow up for that in 3 months.   Please call if you have any questions.   Merry Christmas!! - Dr. Bonner Puna

## 2014-06-16 NOTE — Assessment & Plan Note (Signed)
Secondary to hypoglycemic seizure. No significant tear suspected.  Tx: NSAIDs prn pain, exercises as tolerated

## 2014-07-05 ENCOUNTER — Other Ambulatory Visit: Payer: Self-pay | Admitting: Family Medicine

## 2014-07-17 ENCOUNTER — Encounter: Payer: Self-pay | Admitting: Family Medicine

## 2014-07-17 ENCOUNTER — Ambulatory Visit (INDEPENDENT_AMBULATORY_CARE_PROVIDER_SITE_OTHER): Payer: Medicare Other | Admitting: Family Medicine

## 2014-07-17 VITALS — BP 140/75 | Ht 64.0 in | Wt 164.0 lb

## 2014-07-17 DIAGNOSIS — M6530 Trigger finger, unspecified finger: Secondary | ICD-10-CM | POA: Diagnosis not present

## 2014-07-17 DIAGNOSIS — M1811 Unilateral primary osteoarthritis of first carpometacarpal joint, right hand: Secondary | ICD-10-CM

## 2014-07-17 DIAGNOSIS — M65312 Trigger thumb, left thumb: Secondary | ICD-10-CM | POA: Diagnosis not present

## 2014-07-17 MED ORDER — METHYLPREDNISOLONE ACETATE 40 MG/ML IJ SUSP
40.0000 mg | Freq: Once | INTRAMUSCULAR | Status: AC
  Administered 2014-07-17: 40 mg via INTRA_ARTICULAR

## 2014-07-17 NOTE — Assessment & Plan Note (Signed)
Injection #2 today in flexor IP sheath -Tolerated well -f/u if needed

## 2014-07-17 NOTE — Progress Notes (Signed)
Patient ID: Alicia [name], female   DOB: 09/05/1955, 59 y.o.   MRN: [id number] lori king is the interpretor for today

## 2014-07-17 NOTE — Assessment & Plan Note (Signed)
3rd A1 pulley sheath injection today, #3 -follow-up if needed

## 2014-07-17 NOTE — Progress Notes (Signed)
   Subjective:    Patient ID: Alicia [name], female    DOB: Jul 28, 1955, 59 y.o.   MRN: [id number]  HPI Visit aid of an interpreter, sign language.  Alicia [name] is a 59 year old female who presents for follow-up of left thumb and right third finger pain. She was last seen on 02/06/14 by Dr. Nori Riis. She has had triggering of her left thumb and right third finger for several months. She says that the injection in the left thumb and right finger helped for several months. She describes a catching sensation in her left thumb. She is deaf so she does a lot of sign language. This is really interfering with her ability to communicate and she's having a lot of pain and sticking sensations.  Past medical history, social history, medications, and allergies were reviewed and are up to date in the chart.   Review of Systems 7 point review of systems was performed and was otherwise negative unless noted in the history of present illness.     Objective:   Physical Exam BP 140/75 mmHg  Ht 5\' 4"  (1.626 m)  Wt 164 lb (74.39 kg)  BMI 28.14 kg/m2 GEN: The patient is well-developed well-nourished female and in no acute distress.  She is awake alert and oriented x3. SKIN: warm and well-perfused, no rash  EXTR: No lower extremity edema or calf tenderness Neuro: Strength 5/5 globally. Sensation intact throughout. No focal deficits. Vasc: +2 bilateral distal pulses. No edema.  MSK: Examination of the left thumb reveals no swelling or induration. Range of motion is full, though with passive range of motion I do appreciate some catching at the IP flexor pulley. Similar findings are found in the right third finger.  Procedure: After obtaining informed, verbal consent the patient's skin was cleansed with alcohol and Betadine.  Subsequently she was injected with a 1-1 mixture of betamethasone 6 mg per mL and 1% lidocaine plain into the left IP flexor pulley sheath and right 3rd A1 flexor pulley sheath .  She tolerated  procedure.  No complications.  Prior to the procedure risks benefits and treatment alternatives were discussed.     Assessment & Plan:  Please see problem based assessment and plan in the problem list.

## 2014-07-21 ENCOUNTER — Other Ambulatory Visit: Payer: Self-pay | Admitting: Family Medicine

## 2014-07-22 ENCOUNTER — Other Ambulatory Visit: Payer: Self-pay | Admitting: *Deleted

## 2014-07-29 ENCOUNTER — Telehealth: Payer: Self-pay | Admitting: *Deleted

## 2014-07-29 NOTE — Telephone Encounter (Signed)
Pt called thru help of a sign language interpreter, was having itching and swelling in her right hand.  Thinks it was due to the injection she had last Friday.  I suggested she take some benedryl and ibuprofen and see if that helps, otherwise she will call back or see urgent care.

## 2014-08-07 ENCOUNTER — Telehealth: Payer: Self-pay | Admitting: *Deleted

## 2014-08-07 DIAGNOSIS — E118 Type 2 diabetes mellitus with unspecified complications: Secondary | ICD-10-CM

## 2014-08-07 MED ORDER — GLUCOSE BLOOD VI STRP: ORAL_STRIP | Status: DC

## 2014-08-07 NOTE — Telephone Encounter (Signed)
Completed request with Dx E11.8 to check BG 4x /day.

## 2014-08-07 NOTE — Telephone Encounter (Signed)
Received a message from [facility] pharmacy stating they need a Rx for pt's test strips.  They need ICD-10 code with specific directions and quantity.  Please to do put Use as Directed or Instructed.  Please send to [place].  Derl Barrow, RN

## 2014-08-11 ENCOUNTER — Other Ambulatory Visit: Payer: Self-pay | Admitting: Family Medicine

## 2014-08-11 DIAGNOSIS — E118 Type 2 diabetes mellitus with unspecified complications: Secondary | ICD-10-CM

## 2014-08-11 MED ORDER — GLUCOSE BLOOD VI STRP: ORAL_STRIP | Status: DC

## 2014-09-04 ENCOUNTER — Encounter: Payer: Self-pay | Admitting: Family Medicine

## 2014-09-04 ENCOUNTER — Ambulatory Visit (INDEPENDENT_AMBULATORY_CARE_PROVIDER_SITE_OTHER): Payer: Medicare Other | Admitting: Family Medicine

## 2014-09-04 VITALS — BP 134/69 | HR 87 | Temp 97.5°F | Ht 64.0 in | Wt 165.7 lb

## 2014-09-04 DIAGNOSIS — J4520 Mild intermittent asthma, uncomplicated: Secondary | ICD-10-CM

## 2014-09-04 DIAGNOSIS — K3184 Gastroparesis: Secondary | ICD-10-CM

## 2014-09-04 DIAGNOSIS — T466X5A Adverse effect of antihyperlipidemic and antiarteriosclerotic drugs, initial encounter: Secondary | ICD-10-CM

## 2014-09-04 DIAGNOSIS — E11321 Type 2 diabetes mellitus with mild nonproliferative diabetic retinopathy with macular edema: Secondary | ICD-10-CM

## 2014-09-04 DIAGNOSIS — E1142 Type 2 diabetes mellitus with diabetic polyneuropathy: Secondary | ICD-10-CM

## 2014-09-04 DIAGNOSIS — Z23 Encounter for immunization: Secondary | ICD-10-CM

## 2014-09-04 DIAGNOSIS — M79671 Pain in right foot: Secondary | ICD-10-CM

## 2014-09-04 DIAGNOSIS — E113219 Type 2 diabetes mellitus with mild nonproliferative diabetic retinopathy with macular edema, unspecified eye: Secondary | ICD-10-CM

## 2014-09-04 DIAGNOSIS — E118 Type 2 diabetes mellitus with unspecified complications: Secondary | ICD-10-CM | POA: Diagnosis not present

## 2014-09-04 DIAGNOSIS — J309 Allergic rhinitis, unspecified: Secondary | ICD-10-CM

## 2014-09-04 DIAGNOSIS — G720 Drug-induced myopathy: Secondary | ICD-10-CM

## 2014-09-04 DIAGNOSIS — H6121 Impacted cerumen, right ear: Secondary | ICD-10-CM

## 2014-09-04 LAB — POCT GLYCOSYLATED HEMOGLOBIN (HGB A1C): Hemoglobin A1C: 8.5

## 2014-09-04 MED ORDER — BACITRACIN 500 UNIT/GM EX OINT: 1.0000 "application " | TOPICAL_OINTMENT | Freq: Two times a day (BID) | CUTANEOUS | Status: DC

## 2014-09-04 MED ORDER — GABAPENTIN 400 MG PO CAPS: 1200.0000 mg | ORAL_CAPSULE | Freq: Two times a day (BID) | ORAL | Status: DC

## 2014-09-04 MED ORDER — IBUPROFEN 600 MG PO TABS: ORAL_TABLET | ORAL | Status: DC

## 2014-09-04 NOTE — Progress Notes (Signed)
Subjective: Alicia [name] is a 59 y.o. female here for diabetes follow up.   - She has insulin-dependent T2DM. Takes lantus 6u qHS and mealtime novolog on sliding scale, reporting good compliance. Checks blood sugar at home. Average AM: < 150. No hypoglycemia. She is able to explain common hypoglycemic symptoms and appropriate action plan in the event of these symptoms/low CBG.   - Additionally she complains of thigh pain. Bilateral moderate to severe "aching" R > L thigh pain for about an hour every day when she wakes up in the morning for the past 2 months. Exercise seems to make it worse. Nothing makes it better.   - Also right foot pain, burning on bottom and aching on the lateral side associated with localized swelling worse at the end of the day. Takes gabapentin 2 in the AM and 3 at night but doesn't think this is helping. Ibuprofen helps the aching.   - Also right ear itching - no drainage or fevers - Also abdominal itching - was not addressed - Also left thumb pain - was not addressed She has diabetic retinopathy and follows up regularly with Dr. Zigmund Daniel. Flu and pneumovax: UTD HIV: due x1 TDaP: due PAP May 2016  - ROS: Denies fever, chills, weight loss, dizziness, vision changes, syncope, polyuria, nocturia, paresthesias, polydipsia, chest pain, new wounds.  All other systems reviewed and are negative. - [place]: nonsmoker, no EtOH, no illicit drugs. - Medications: reviewed and updated  Objective: BP 134/69 mmHg  Pulse 87  Temp(Src) 97.5 F (36.4 C) (Oral)  Ht 5\' 4"  (1.626 m)  Wt 165 lb 11.2 oz (75.161 kg)  BMI 28.43 kg/m2 Gen: Well - appearing 59 y.o.female in no distress HEENT: L TM normal, R canal completely obstructed with yellow cerumen Neck: Neck supple, no masses or lymphadenopathy; thyroid not enlarged  Pulm: Non-labored; CTAB, no wheezes  CV: Regular rate, no murmur appreciated; no LE edema, no JVD See diabetic foot exam - simple  Neuro: CN II-XII without  deficits, sensation intact to light touch, steady gait R ear canal: Washout and debridement limited by pain, still obstructed with great improvement in cerumen burden.   Assessment & Plan: Alicia [name] is a 59 y.o. female here for diabetes, control improving.     See problem list for problem-specific plans.

## 2014-09-04 NOTE — Assessment & Plan Note (Signed)
Stable vision, follows up with Dr. Zigmund Daniel 11/11/2014

## 2014-09-04 NOTE — Assessment & Plan Note (Signed)
XR negative for fracture, but may be occult stress fracture given her high level of activity

## 2014-09-04 NOTE — Assessment & Plan Note (Addendum)
Hb A1c 8.5 from > 9 after baseline 7.0 - 7.9 since 2008.  I suspect improvement will continue back to her baseline. She has a history of hypoglycemia with seizure so will not be overly aggressive. No change in insulin regimen.  - D/C statin as I suspect myopathy, follow up 3 months.   - Significant microvascular complications already: retinopathy, peripheral neuropathy, gastroparesis so goal A1c is 7.0-7.9.

## 2014-09-04 NOTE — Assessment & Plan Note (Signed)
Burning component of foot pain not controlled with neurontin total daily dose 1500mg ; will increase to 2400mg  daily dose, follow up in 3 months.

## 2014-09-04 NOTE — Patient Instructions (Signed)
-   Take neurontin 400mg  capsules (these have been sent to your pharmacy) as directed: This will be 1200mg  (3 capsules) twice a day.  - I have refilled ibuprofen - For your foot pain I also recommend wearing shoes that have a wide toe box, and avoiding those with pointed toes or high heels. Also apply an ice pack at the end of the day.  - Stop taking crestor until we follow up in 3 months. We will follow up diabetes at that time.  - Make another appointment sometime after May 1st for a pap smear.   Take care,  Dr. Bonner Puna

## 2014-09-17 ENCOUNTER — Other Ambulatory Visit: Payer: Self-pay | Admitting: Family Medicine

## 2014-09-17 DIAGNOSIS — Z1231 Encounter for screening mammogram for malignant neoplasm of breast: Secondary | ICD-10-CM

## 2014-09-21 ENCOUNTER — Other Ambulatory Visit: Payer: Self-pay | Admitting: Family Medicine

## 2014-09-22 ENCOUNTER — Telehealth: Payer: Self-pay | Admitting: Family Medicine

## 2014-09-22 NOTE — Telephone Encounter (Signed)
Spoke with pt through sign language service by phone and she stated that she is NOT taking the crestor anymore and that the pain is still happening and wants to know if there is something else that can be given to help with this.  I told the pt we would contact her after the doctor responded back to this request. She was in agreement. Katharina Caper, Kerrion Kemppainen D

## 2014-09-22 NOTE — Telephone Encounter (Signed)
Pt called because she was seen for her thighs being very sore. She was given Crestor. This is not helping and would like to know if something else can be called in. jw

## 2014-09-22 NOTE — Telephone Encounter (Signed)
Called and left message with the service for the deaf to have pt call back to inform her of below. Katharina Caper, April D

## 2014-09-22 NOTE — Telephone Encounter (Signed)
Patient was instructed to stop taking Crestor at last visit. This may be why she is having this thigh soreness.

## 2014-09-23 NOTE — Telephone Encounter (Signed)
Interpreter: Production manager services [phone]  Discussed thigh pain with patient. Has been taking one  ibuprofen 600mg  q6 hours which has not helped. She has stopped taking Crestor with no improvement in pain. Discussed making an appointment for follow-up tomorrow as a Same day. Patient stated she would call to make an appointment.

## 2014-09-27 ENCOUNTER — Other Ambulatory Visit: Payer: Self-pay | Admitting: Family Medicine

## 2014-09-28 ENCOUNTER — Ambulatory Visit (INDEPENDENT_AMBULATORY_CARE_PROVIDER_SITE_OTHER): Payer: Medicare Other | Admitting: Family Medicine

## 2014-09-28 ENCOUNTER — Encounter: Payer: Self-pay | Admitting: Family Medicine

## 2014-09-28 VITALS — BP 158/85 | HR 78 | Temp 97.5°F | Ht 64.0 in | Wt 172.0 lb

## 2014-09-28 DIAGNOSIS — M791 Myalgia, unspecified site: Secondary | ICD-10-CM | POA: Insufficient documentation

## 2014-09-28 LAB — COMPREHENSIVE METABOLIC PANEL
ALT: 8 U/L (ref 0–35)
AST: 11 U/L (ref 0–37)
Albumin: 4 g/dL (ref 3.5–5.2)
Alkaline Phosphatase: 81 U/L (ref 39–117)
BUN: 18 mg/dL (ref 6–23)
CO2: 27 mEq/L (ref 19–32)
Calcium: 10.3 mg/dL (ref 8.4–10.5)
Chloride: 101 mEq/L (ref 96–112)
Creat: 0.61 mg/dL (ref 0.50–1.10)
Glucose, Bld: 216 mg/dL — ABNORMAL HIGH (ref 70–99)
Potassium: 4.4 mEq/L (ref 3.5–5.3)
Sodium: 136 mEq/L (ref 135–145)
Total Bilirubin: 0.3 mg/dL (ref 0.2–1.2)
Total Protein: 7 g/dL (ref 6.0–8.3)

## 2014-09-28 LAB — CBC WITH DIFFERENTIAL/PLATELET
Basophils Absolute: 0.1 10*3/uL (ref 0.0–0.1)
Basophils Relative: 1 % (ref 0–1)
Eosinophils Absolute: 0.3 10*3/uL (ref 0.0–0.7)
Eosinophils Relative: 5 % (ref 0–5)
HCT: 36.5 % (ref 36.0–46.0)
Hemoglobin: 11.8 g/dL — ABNORMAL LOW (ref 12.0–15.0)
Lymphocytes Relative: 33 % (ref 12–46)
Lymphs Abs: 1.9 10*3/uL (ref 0.7–4.0)
MCH: 29.5 pg (ref 26.0–34.0)
MCHC: 32.3 g/dL (ref 30.0–36.0)
MCV: 91.3 fL (ref 78.0–100.0)
MPV: 10.4 fL (ref 8.6–12.4)
Monocytes Absolute: 0.5 10*3/uL (ref 0.1–1.0)
Monocytes Relative: 8 % (ref 3–12)
Neutro Abs: 3 10*3/uL (ref 1.7–7.7)
Neutrophils Relative %: 53 % (ref 43–77)
Platelets: 336 10*3/uL (ref 150–400)
RBC: 4 MIL/uL (ref 3.87–5.11)
RDW: 14.1 % (ref 11.5–15.5)
WBC: 5.7 10*3/uL (ref 4.0–10.5)

## 2014-09-28 LAB — POCT SEDIMENTATION RATE: POCT SED RATE: 11 mm/hr (ref 0–22)

## 2014-09-28 LAB — C-REACTIVE PROTEIN: CRP: <0.5 mg/dL (ref <0.60)

## 2014-09-28 LAB — CK: Total CK: 88 U/L (ref 7–177)

## 2014-09-28 LAB — TSH: TSH: 1.949 u[IU]/mL (ref 0.350–4.500)

## 2014-09-28 NOTE — Progress Notes (Signed)
Morning preceptor available.

## 2014-09-28 NOTE — Assessment & Plan Note (Addendum)
Pt c/o 3 months of proximal LE myalgias with diffuse morning pain.  DDx includes Polymyositis (no grotton's papules or mechanic hands or shaw's sign or eyelid erythema/edema), dermatomyositis, fibromyalgia, polymyalgia rheumatica (No visual changes at this time so GCA very very low likelihood).  Previously taken off Crestor for possible drug induced myalgias but no improvement.  Could consider low back etiology as well.   - Check CBC with diff, CRP, ESR, and CK, TSH - F/U in one week, possible trial of steroids and referral to rheumatology

## 2014-09-28 NOTE — Patient Instructions (Signed)
Please make an appointment for one week.  Thanks, DR. Aleka Twitty

## 2014-09-28 NOTE — Progress Notes (Signed)
Alicia [name] is a 59 y.o. female who presents today for B/L proximal thigh myalgias.  Myalgias - Ongoing now for about 3 months.  No inciting event or medication change/starting at that time.  Stopped on Crestor at that time due to possible drug induced myopathy but this had not helped at all.  Stiffness and proximal pain worse in AM, does get slightly better throughout the day but denies complete improvement.  Has tried ibuprofen without improvement.  Denies paresthesias or muscle weakness of lower extremity.  Denies visual changes, rashes, shoulder pain or stiffness, or FHx of autoimmmune disease.  Denies low back pain or previous low back injury.    Past Medical History  Diagnosis Date  . Diabetes mellitus   . Hypertension   . Hyperlipidemia   . Gastroparesis   . Diabetic neuropathy   . GERD (gastroesophageal reflux disease)   . Complete deafness     Meningitis at age 75  . S/P appy   . Deaf   . H/O: C-section   . Ganglion cyst 06/22/2011    Current Outpatient Prescriptions on File Prior to Visit  Medication Sig Dispense Refill  . aspirin (BAYER CHILDRENS ASPIRIN) 81 MG chewable tablet Chew 81 mg by mouth daily.      . bacitracin 500 UNIT/GM ointment Apply 1 application topically 2 (two) times daily. 15 g 0  . Blood Glucose Monitoring Suppl (ACCU-CHEK AVIVA) device Use as instructed 1 each 0  . Calcium Carbonate-Vitamin D (CALCIUM 600/VITAMIN D) 600-400 MG-UNIT per chew tablet Chew 1 tablet by mouth 2 (two) times daily.     . cholecalciferol (VITAMIN D) 1000 UNITS tablet Take 1,000 Units by mouth daily.    . cyanocobalamin 500 MCG tablet Take 500 mcg by mouth daily.    . diclofenac (VOLTAREN) 75 MG EC tablet Take 1 tablet (75 mg total) by mouth 2 (two) times daily as needed. 30 tablet 1  . Diclofenac Sodium 1 % CREA 1 application to painful joints qid 120 g 0  . DUREZOL 0.05 % EMUL     . ferrous sulfate 325 (65 FE) MG tablet Take 1 tablet (325 mg total) by mouth daily with  breakfast.  3  . fluticasone (FLONASE) 50 MCG/ACT nasal spray 2 sprays to each nostril daily. Use nasal saline before use. 16 g 11  . gabapentin (NEURONTIN) 400 MG capsule Take 3 capsules (1,200 mg total) by mouth 2 (two) times daily. Take 2 cap twice daily and 3 caps at bedtime. 180 capsule 3  . glucose blood (ACCU-CHEK AVIVA PLUS) test strip CHECK SUGAR 4 TIMES A DAILY 200 each 3  . hydrochlorothiazide (HYDRODIURIL) 50 MG tablet TAKE 1 TABLET BY MOUTH EVERY DAY 90 tablet 3  . hyoscyamine (LEVSIN/SL) 0.125 MG SL tablet Take one tablet every 6 hours only for persistent cramping 10 tablet 0  . ibuprofen (ADVIL,MOTRIN) 600 MG tablet Take 1 tab every 6 hours as needed for pain 90 tablet 0  . insulin aspart (NOVOLOG) 100 UNIT/ML injection Inject 1-3 Units into the skin 3 (three) times daily before meals. 1 vial 0  . insulin glargine (LANTUS) 100 UNIT/ML injection INJECT 6 UNITS SUBCUTANEOUSLY AT BEDTIME 10 mL 3  . Lancets (ACCU-CHEK SOFT TOUCH) lancets Use as instructed 200 each 12  . losartan (COZAAR) 100 MG tablet TAKE 1 TABLET BY MOUTH EVERY DAY 90 tablet 3  . meloxicam (MOBIC) 15 MG tablet Take 1 tablet (15 mg total) by mouth daily. 30 tablet 2  . metFORMIN (  GLUCOPHAGE) 1000 MG tablet TAKE 1 TABLET BY MOUTH TWO TIMES DAILY. 60 tablet 5  . metoCLOPramide (REGLAN) 10 MG tablet Take 1 tablet (10 mg total) by mouth 3 (three) times daily before meals. 90 tablet 2  . metoprolol succinate (TOPROL-XL) 50 MG 24 hr tablet TAKE 1 TABLET BY MOUTH EVERY DAY *TAKE WITH OR IMMEDIATELY FOLLOWING A MEAL 90 tablet 3  . NOVOLOG 100 UNIT/ML injection TAKE AS DIRECTED PER GLUCOSE: 150-200 GIVE 2UNITS, IF 201-250 GIVE 3UNITS >250 GIVE 4 UNITS 10 mL 11  . oxyCODONE-acetaminophen (ROXICET) 5-325 MG per tablet Take 0.5-1 tablets by mouth 2 (two) times daily as needed for severe pain. 20 tablet 0  . polyethylene glycol powder (GLYCOLAX/MIRALAX) powder TAKE 17 GRAMS MIXED IN LIQUID THREE TIMES A WEEK 527 g 3   No current  facility-administered medications on file prior to visit.    ROS: Per HPI.  All other systems reviewed and are negative.   Physical Exam Filed Vitals:   09/28/14 0936  BP: 158/85  Pulse: 78  Temp: 97.5 F (36.4 C)    Physical Examination: General appearance - alert, well appearing, and in no distress Musculoskeletal - Normal inspection of thigh B/L, no TTP, normal ROM, MS 5/5 B/L LE.  Neurovascular intact B/L LE.  Negative SLR B/L LE Skin - No rashes LE, back, or face    Chemistry      Component Value Date/Time   NA 135 07/10/2013 1441   K 3.7 07/10/2013 1441   CL 97 07/10/2013 1441   CO2 30 07/10/2013 1441   BUN 10 07/10/2013 1441   CREATININE 0.53 07/10/2013 1441   CREATININE 0.52 09/06/2010 1516      Component Value Date/Time   CALCIUM 10.7* 07/10/2013 1441   CALCIUM 10.3 09/30/2010 1658   ALKPHOS 72 07/10/2013 1441   AST 13 07/10/2013 1441   ALT 9 07/10/2013 1441   BILITOT 0.3 07/10/2013 1441

## 2014-10-06 ENCOUNTER — Encounter: Payer: Self-pay | Admitting: Family Medicine

## 2014-10-06 ENCOUNTER — Ambulatory Visit (INDEPENDENT_AMBULATORY_CARE_PROVIDER_SITE_OTHER): Payer: Medicare Other | Admitting: Family Medicine

## 2014-10-06 VITALS — BP 129/58 | HR 87 | Temp 97.6°F | Ht 64.0 in | Wt 166.7 lb

## 2014-10-06 DIAGNOSIS — M791 Myalgia, unspecified site: Secondary | ICD-10-CM

## 2014-10-06 MED ORDER — NORTRIPTYLINE HCL 25 MG PO CAPS: 25.0000 mg | ORAL_CAPSULE | Freq: Every day | ORAL | Status: DC

## 2014-10-06 NOTE — Patient Instructions (Signed)
I have prescribed a medication called Nortriptyline for your pain.  Take it as prescribed.  Follow up in 1-2 weeks with me (Dr. Lacinda Axon), Dr. Awanda Mink, or Dr. Bonner Puna.  Take care  Dr. Lacinda Axon

## 2014-10-07 NOTE — Assessment & Plan Note (Signed)
Unclear etiology of patient's symptoms at this time. Polymyositis/myositis effectively ruled out given normal CK. We expect elevated sedimentation rate with pollen out her rheumatica or other inflammatory processes (patient with normal ESR and CRP). Mass her underlying malignancy unlikely as it is bilateral in nature. This could be a variant of fibromyalgia or underlying neuropathic pain. Will treat with nortriptyline. Patient is to continue her home gabapentin. Patient to follow-up in approximately one week. If no improvement will consider imaging of the lumbar spine or potentially referral to neurology or rheumatology.

## 2014-10-07 NOTE — Progress Notes (Signed)
   Subjective:    Patient ID: Alicia [name], female    DOB: 1955/11/07, 59 y.o.   MRN: [id number]  HPI  59 year old female with a history of DM 2 and polyneuropathy presents for follow-up regarding bilateral thigh pain.  Patient is deaf and a sign language interpreter was used during the visit.  1) Bilateral Thigh pain  Patient was recently seen on 3/28 with complaints of bilateral thigh pain.  At that time she reported a 3 month history of this.  No associated back pain, numbness, tingling, paresthesia. No reported weakness of that time.  She was evaluated and laboratory studies were obtained.  Patient had a negative CK, normal ESR and CRP, normal TSH.  She presents today with continued complaints of bilateral thigh pain. She reports it is worse in the morning.  She reports associated weakness. She states that it interferes with her ability to walk as it is painful.  Patient also notes left shoulder pain.  No recent fevers, chills. No rashes. No reports of back pain. Denies numbness or tingling.  No relieving factors. She is still not taking her Crestor and found that that has not improved things.  Review of Systems Per HPI    Objective:   Physical Exam Filed Vitals:   10/06/14 0957  BP: 129/58  Pulse: 87  Temp: 97.6 F (36.4 C)   Exam: General: well appearing, NAD. MSK: Normal inspection of bilateral thighs.  Very tender to palpation bilaterally. Sensation intact. Muscle strength 5/5 (but painful).   Assessment & Plan:  See Problem List

## 2014-10-10 ENCOUNTER — Other Ambulatory Visit: Payer: Self-pay | Admitting: Family Medicine

## 2014-10-20 ENCOUNTER — Ambulatory Visit: Payer: Medicare Other | Admitting: Family Medicine

## 2014-10-20 ENCOUNTER — Other Ambulatory Visit: Payer: Self-pay | Admitting: *Deleted

## 2014-10-20 NOTE — Telephone Encounter (Signed)
Pt is out of medication.  Derl Barrow, RN

## 2014-10-21 MED ORDER — LOSARTAN POTASSIUM 100 MG PO TABS: 100.0000 mg | ORAL_TABLET | Freq: Every day | ORAL | Status: DC

## 2014-10-28 ENCOUNTER — Ambulatory Visit
Admission: RE | Admit: 2014-10-28 | Discharge: 2014-10-28 | Disposition: A | Discharge status: Home / Self Care | Payer: Medicare Other | Source: Ambulatory Visit | Attending: Family Medicine | Admitting: Family Medicine

## 2014-10-28 ENCOUNTER — Ambulatory Visit (INDEPENDENT_AMBULATORY_CARE_PROVIDER_SITE_OTHER): Payer: Medicare Other | Admitting: Family Medicine

## 2014-10-28 ENCOUNTER — Encounter: Payer: Self-pay | Admitting: Family Medicine

## 2014-10-28 VITALS — BP 130/67 | HR 82 | Temp 97.6°F | Ht 64.0 in | Wt 165.2 lb

## 2014-10-28 DIAGNOSIS — M25552 Pain in left hip: Secondary | ICD-10-CM | POA: Diagnosis not present

## 2014-10-28 DIAGNOSIS — M5136 Other intervertebral disc degeneration, lumbar region: Secondary | ICD-10-CM | POA: Diagnosis not present

## 2014-10-28 DIAGNOSIS — M25551 Pain in right hip: Secondary | ICD-10-CM | POA: Diagnosis not present

## 2014-10-28 DIAGNOSIS — M25561 Pain in right knee: Secondary | ICD-10-CM | POA: Diagnosis not present

## 2014-10-28 DIAGNOSIS — M79652 Pain in left thigh: Secondary | ICD-10-CM

## 2014-10-28 DIAGNOSIS — M79651 Pain in right thigh: Secondary | ICD-10-CM

## 2014-10-28 DIAGNOSIS — M25562 Pain in left knee: Secondary | ICD-10-CM | POA: Diagnosis not present

## 2014-10-28 NOTE — Progress Notes (Signed)
   Subjective:    Patient ID: Alicia [name], female    DOB: 08-04-1955, 59 y.o.   MRN: [id number]  HPI 59 year old female with a past medical history of hypertension and type 2 diabetes with associated neuropathy presents for follow-up regarding bilateral thigh pain. Sign Language interpreter used during visit.   1) Thigh pain  Patient continues to have bilateral anterior thigh pain. She's had this for several months now.  She was seen previously by Dr. Awanda Mink as well as myself. She's had an markable laboratory workup with negative plantar markers, CK, CBC, BMP, TSH.  At her last visit on 4/5, I advised her to continue her gabapentin and to start nortriptyline for her pain.  Patient states that she continues to have moderate to severe pain of her thighs. She had no significant relief with nortriptyline.  She is currently not taking the nortriptyline as it made her quite drowsy.  Patient has a lot of difficulty describing the nature of her pain. She does state that it is different from her neuropathic pain that she has with her diabetes.  Patient continues to endorse increased pain with physical activity.  No reported weakness.  Additionally, patient reports that she's had some low back pain recently.  No recent fevers or chills. No reports of numbness or tingling.  Social Hx - Non smoker. PMH reviewed.   Review of Systems Per HPI    Objective:   Physical Exam Filed Vitals:   10/28/14 0909  BP: 130/67  Pulse: 82  Temp: 97.6 F (36.4 C)  Vital signs reviewed.  Exam: General: well appearing, NAD. MSK: Normal inspection of bilateral thighs. Very tender to palpation bilaterally. Sensation intact. Muscle strength 5/5.  Back: Patient with bilateral paraspinal muscle tenderness of the lumbar spine.    Assessment & Plan:  See Problem List

## 2014-10-28 NOTE — Assessment & Plan Note (Signed)
Patient continues to have bilateral thigh pain of unclear etiology. Pain is moderate to severe and has been persistent. Negative laboratory workup thus far. With his back pain will obtain imaging to assess for underlying pathology today. X-rays of the lumbar spine, pelvis, and knees done today. I have personally reviewed these images. X-ray lumbar spine reveal mild degenerative changes. X-ray of the pelvis was negative with no evidence of hip OA.  Bilateral standing knee films  revealed very mild decrease in joint space and medial compartment.  Etiology for her pain remains elusive.  Encouraged patient to take Voltaren and as needed for pain as well as scheduled acetaminophen.  Will call patient with imaging results and have her follow-up.  Next after this point will be referral to either rheumatology or neurology.

## 2014-10-28 NOTE — Patient Instructions (Addendum)
It was nice to see you today.  I will notify you of the xray results.  Take the voltaren as needed.  Take Tylenol 1000 mg three times daily.  Follow up in 1-2 weeks.

## 2014-10-29 ENCOUNTER — Ambulatory Visit (HOSPITAL_COMMUNITY)
Admission: RE | Admit: 2014-10-29 | Discharge: 2014-10-29 | Disposition: A | Discharge status: Home / Self Care | Payer: Medicare Other | Source: Ambulatory Visit | Attending: Internal Medicine | Admitting: Internal Medicine

## 2014-10-29 DIAGNOSIS — Z1231 Encounter for screening mammogram for malignant neoplasm of breast: Secondary | ICD-10-CM | POA: Insufficient documentation

## 2014-11-09 ENCOUNTER — Encounter: Payer: Self-pay | Admitting: Family Medicine

## 2014-11-09 ENCOUNTER — Ambulatory Visit (INDEPENDENT_AMBULATORY_CARE_PROVIDER_SITE_OTHER): Payer: Medicare Other | Admitting: Family Medicine

## 2014-11-09 VITALS — BP 147/67 | HR 83 | Temp 97.5°F | Ht 64.0 in | Wt 169.8 lb

## 2014-11-09 DIAGNOSIS — M79651 Pain in right thigh: Secondary | ICD-10-CM

## 2014-11-09 DIAGNOSIS — M79652 Pain in left thigh: Secondary | ICD-10-CM

## 2014-11-09 DIAGNOSIS — M25551 Pain in right hip: Secondary | ICD-10-CM

## 2014-11-09 MED ORDER — METHYLPREDNISOLONE ACETATE 40 MG/ML IJ SUSP
40.0000 mg | Freq: Once | INTRAMUSCULAR | Status: AC
  Administered 2014-11-09: 40 mg via INTRA_ARTICULAR

## 2014-11-09 NOTE — Assessment & Plan Note (Signed)
Etiology remains unclear. Patient has had negative laboratory workup as well as initial imaging. I discussed this case with attending Dr. Nori Riis.  She has had individuals who've had diffuse pain secondary to trochanteric bursitis. Patient is tender diffusely as well as over the bursa.   As a result, injection was performed today in the right trochanteric bursa as this the most troublesome side. Patient tolerated procedure well and will follow-up with Dr. Nori Riis later this week.  I'm still unclear regarding the etiology of her pain.  She has no improvement with injection I would recommend advanced imaging with MRI versus referral.

## 2014-11-09 NOTE — Progress Notes (Signed)
   Subjective:    Patient ID: Alicia [name], female    DOB: [birth year], 59 y.o.   MRN: [id number]  HPI  59 year old female with a past medical history of hypertension and type 2 diabetes with associated neuropathy presents for follow-up regarding bilateral thigh pain. Sign Language interpreter used during visit.   1) Thigh pain - She has had a negative workup and has failed to respond to treatments for neuropathic pain nortriptyline, gabapentin).  - Imaging was done at last visit and was negative.  The etiology of her symptoms remains unclear. - Patient presents today for follow-up. She continues to endorse bilateral anterior thigh pain. - Pain is worse first thing in the morning and with physical activity. No relieving factors. - No reported weakness, fevers, chills, weakness, numbness/tingling.  Review of Systems Per HPI    Objective:   Physical Exam Filed Vitals:   11/09/14 0933  BP: 147/67  Pulse: 83  Temp: 97.5 F (36.4 C)   Vital signs reviewed.  Exam: General: well appearing, NAD. MSK: Normal inspection of bilateral thighs. Very tender to palpation bilaterally diffusely and over both trochanters (right greater than left).  Sensation intact. Muscle strength 5/5.   Procedure: Right trochanteric bursa injection. Consent signed and scanned into record. Medication:  1 cc Solumedrol , 4 cc Lidocaine 1% without epi Preparation: area cleansed with betadine Time Out taken  Injection Landmarks identified Above medication injected using a standard approach.  Patient tolerated well without bleeding or paresthesias.     Assessment & Plan:  See Problem List.

## 2014-11-11 ENCOUNTER — Ambulatory Visit (INDEPENDENT_AMBULATORY_CARE_PROVIDER_SITE_OTHER): Payer: Medicare Other | Admitting: Ophthalmology

## 2014-11-11 DIAGNOSIS — E10319 Type 1 diabetes mellitus with unspecified diabetic retinopathy without macular edema: Secondary | ICD-10-CM | POA: Diagnosis not present

## 2014-11-11 DIAGNOSIS — H35033 Hypertensive retinopathy, bilateral: Secondary | ICD-10-CM | POA: Diagnosis not present

## 2014-11-11 DIAGNOSIS — E10359 Type 1 diabetes mellitus with proliferative diabetic retinopathy without macular edema: Secondary | ICD-10-CM | POA: Diagnosis not present

## 2014-11-11 DIAGNOSIS — I10 Essential (primary) hypertension: Secondary | ICD-10-CM

## 2014-11-13 ENCOUNTER — Encounter: Payer: Self-pay | Admitting: Family Medicine

## 2014-11-13 ENCOUNTER — Other Ambulatory Visit: Payer: Self-pay | Admitting: Family Medicine

## 2014-11-13 ENCOUNTER — Ambulatory Visit (INDEPENDENT_AMBULATORY_CARE_PROVIDER_SITE_OTHER): Payer: Medicare Other | Admitting: Family Medicine

## 2014-11-13 VITALS — BP 133/57 | Ht 63.0 in | Wt 165.0 lb

## 2014-11-13 DIAGNOSIS — R270 Ataxia, unspecified: Secondary | ICD-10-CM

## 2014-11-13 DIAGNOSIS — M79651 Pain in right thigh: Secondary | ICD-10-CM | POA: Diagnosis not present

## 2014-11-13 DIAGNOSIS — M1812 Unilateral primary osteoarthritis of first carpometacarpal joint, left hand: Secondary | ICD-10-CM

## 2014-11-13 DIAGNOSIS — M79652 Pain in left thigh: Secondary | ICD-10-CM

## 2014-11-13 MED ORDER — METHYLPREDNISOLONE ACETATE 40 MG/ML IJ SUSP
40.0000 mg | Freq: Once | INTRAMUSCULAR | Status: AC
  Administered 2014-11-13: 40 mg via INTRA_ARTICULAR

## 2014-11-13 NOTE — Progress Notes (Signed)
Patient ID: Alicia [name], female   DOB: 08-08-1955, 59 y.o.   MRN: [id number]  Alicia [name] - 59 y.o. female MRN [id number]  Date of birth: [birth year]    SUBJECTIVE:     Use of interpreter for the visit: Several issues #1. Recurrence of left thumb pain. Now is also having some pain in the index and long finger, both at the knuckle. Feels similar to the pain in her thumb. In the past she has received benefit from corticosteroid injection into the thumb joint. #2. Having a lot of bilateral hip pain and thigh pain. Her primary care provider has done x-rays of the pelvis, bilateral knees, lumbar spine. No specific abnormality found. They also did a greater trochanteric bursa injection at last visit which was not helpful at all. She notes that her thighs primarily the anterior portion but also the posterior portion are very tender to touch. When her grandchildren sit on her lap, it is painful. It is also sometimes painful to sit down the toilet. #3. Over the last 6 much she's noticed that she is much more clumsy and has had several falls. She feels off-balance much of the time. She runs into things while walking down the hallway, and a few times when she's been bending over to pickup something she's lost her balance and almost followed forward onto her head. This is new for her. She's had no specific clumsiness or lack of dexterity with her hands although she does have some symptoms with her left hand that she thinks is related to her thumb joint pain. ROS:     Negative for unusual weight change, fever, sweats, chills. She's noted no rash or skin changes. No intolerance to heat or cold. Pain in her hips and thighs as per history of present illness and pain in her thumb joint, MCP wanted to on the left. Other than this she has no joint arthralgias. She's noted no joint swelling. No specific joint stiffness. Denies dizziness, denies visual changes specifically no diplopia. No confusion.  PERTINENT  PMH / PSH  FH / / SH:  Past Medical, Surgical, Social, and Family History Reviewed & Updated in the EMR.  Pertinent findings include:  Deaf:  Diabetes mellitus Diabetic retinopathy Trigger thumb, bilateral; CMP joint disease (left greater than right) of  Thumbs Hypertension Hyperlipidemia Calcified uterine fibroid, s/p embolization History of some type of wrist surgery History of small renal cyst, Bosniak 1 Per chart review, she carries a diagnosis of of diabetic neuropathy Per her chart she had a seizure in 2015. Evidently that was associated with hypoglycemia and was an isolated event.    OBJECTIVE: BP 133/57 mmHg  Ht 5\' 3"  (1.6 m)  Wt 165 lb (74.844 kg)  BMI 29.24 kg/m2  Physical Exam:  Vital signs are reviewed. GEN.: Well-developed female no acute distress MSK: Thumb: Left. MCP joint tender to palpation. There is no erythema or swelling. She has full range of motion. Pain with active resisted flexion and opposition. No thenar atrophy is noted. MCP joints of index and long finger are nontender to palpation and have full range of motion, no pain with resisted activity. HIPS: Internal and external rotation is painless and full. Normal hip flexor and extensor strength. Mild diffuse tenderness to palpation laterally but no specific area of tenderness over the greater trochanteric bursa area. She has some diffuse tenderness to palpation of bilateral thighs anteriorly, no specific muscle spasm, no trigger point areas. SHOULDERS: Full range of motion normal strength in  all planes the rotator cuff. There is no tenderness to palpation of the right upper arm, there is very very mild tenderness to palpation of the left deltoid area but no specific trigger point. Scapular motion is symmetrical and normal NECK: Full range of motion. Nontender. No lymphadenopathy. GAIT: Normal stance. Exhibited mild veering to both sides. Some irregularity of stride length, part of this may be compensatory for the veering. She  seems slightly unsteady with her gait. NEURO: Full neuro exam was not done. She is able to stand steady with arms outstretched denies closed Josefa Half), no pronator drift noted.  INJECTION: Patient was given informed consent, signed copy in the chart. Appropriate time out was taken. Area prepped and draped in usual sterile fashion. 1 cc of methylprednisolone 40 mg/ml plus  1 cc of 1% lidocaine without epinephrine was injected into the left MCP thumb joint using a(n) lateral approach. The patient tolerated the procedure well. There were no complications. Post procedure instructions were given.   ASSESSMENT & PLAN:  See problem based charting & AVS for pt instructions.

## 2014-11-13 NOTE — Assessment & Plan Note (Signed)
Her gait seems pretty abnormal today. I have never examined her for this before but I have seen her on multiple occasions in the past for some injections. By her report over the last 4-6 much she's had several falls in episodes of unsteadiness, imbalance. I think this needs to be worked up further and would like to have her seen by neurology. I will make the referral and copy her PCP on this as well.

## 2014-11-13 NOTE — Assessment & Plan Note (Signed)
Corticosteroid injection into left MCP joint today

## 2014-11-13 NOTE — Assessment & Plan Note (Signed)
I'm not sure if the ataxia that I saw today is associated some way with her thigh tenderness and joint pain, at this point I can't see a connection. Her PCP did some basic lab work which showed normal sedimentation rate, total CK, normal thyroid and CBC. I think it's probably more important at this point to get the further evaluation of her ataxi before we start exploring other reasons for her joint pains. Her x-rays for pelvis, knees, back don't enlighten me regarding her joint pains.

## 2014-11-14 ENCOUNTER — Ambulatory Visit (INDEPENDENT_AMBULATORY_CARE_PROVIDER_SITE_OTHER): Payer: Medicare Other

## 2014-11-14 ENCOUNTER — Ambulatory Visit (INDEPENDENT_AMBULATORY_CARE_PROVIDER_SITE_OTHER): Payer: Medicare Other | Admitting: Family Medicine

## 2014-11-14 VITALS — BP 138/60 | HR 61 | Temp 98.0°F | Ht 63.75 in | Wt 165.2 lb

## 2014-11-14 DIAGNOSIS — H9193 Unspecified hearing loss, bilateral: Secondary | ICD-10-CM

## 2014-11-14 DIAGNOSIS — M79622 Pain in left upper arm: Secondary | ICD-10-CM

## 2014-11-14 MED ORDER — HYDROCODONE-ACETAMINOPHEN 5-325 MG PO TABS: 1.0000 | ORAL_TABLET | Freq: Four times a day (QID) | ORAL | Status: DC | PRN

## 2014-11-14 NOTE — Patient Instructions (Addendum)
I do not see a fracture on your x-ray today. You may have pulled a muscle or there could be a subtle fracture that we are not able to see initially on x-ray. I would recommend wearing the arm splint this week and rechecking within the next week to 10 days with possible repeat x-ray or refer to Orthopaedic specialist if still having pain.  You can take over-the-counter Tylenol or Motrin as needed for pain OR if stronger medicine needed I prescribed hydrocodone. This medicine can cause sedation or dizziness so be careful if you have to take it for your pain. Do not combine the hydrocodone with other narcotic pain medicines like oxycodone.   Return to the clinic or go to the nearest emergency room if any of your symptoms worsen or new symptoms occur.

## 2014-11-14 NOTE — Progress Notes (Signed)
Subjective:  This chart was scribed for Merri Ray, MD by Mercy Moore, Medial Scribe. This patient was seen in room 8 and the patient's care was started at 10:32 AM.    Patient ID: Alicia [name], female    DOB: May 29, 1956, 59 y.o.   MRN: [id number] Chief Complaint  Patient presents with  . Fall    x's 2 days ago causing injury to the left arm  . Arm Pain    Fall Pertinent negatives include no fever.  Arm Pain    HPI Comments: Alicia [name] is a 59 y.o. female who presents to the [facility] with a left biciptial arm injury incurred two days after a fall. Patient reports treatment with Tylenol and ibuprofen. Patient denies previous injury to arm. Patient with history of arthritis in her left thumb shares receiving an injection at Sports Medicine yesterday. Patient not evaluated for her left arm pain. Patient presents with makeshift sling applied to arm.  Patient is right hand dominant.  Patient with history of hearing loss, Type II Diabetes Mellitus with diabetic neuropathy. PCP: Vance Gather, MD.    Patient Active Problem List   Diagnosis Date Noted  . Ataxia 11/13/2014  . Bilateral thigh pain 10/28/2014  . Myalgia 09/28/2014  . Right foot pain 09/04/2014  . Seizure 06/08/2014  . Trigger finger, acquired 02/06/2014  . Trigger thumb of left hand 02/06/2014  . Carpal tunnel syndrome 01/05/2014  . Diabetic retinopathy 07/21/2011  . Osteoarthritis of carpometacarpal joint of thumb 04/07/2011  . Gastroparesis 04/15/2010  . Asthma 04/04/2010  . ARTHRITIS, KNEE 09/17/2007  . Diabetic polyneuropathy 08/30/2006  . Type II diabetes mellitus with complication 95/62/1308  . HYPERCHOLESTEROLEMIA 08/30/2006  . HEARING LOSS NOS OR DEAFNESS 08/30/2006  . HYPERTENSION, BENIGN SYSTEMIC 08/30/2006  . Allergic rhinitis 08/30/2006   Past Medical History  Diagnosis Date  . Diabetes mellitus   . Hypertension   . Hyperlipidemia   . Gastroparesis   . Diabetic  neuropathy   . GERD (gastroesophageal reflux disease)   . Complete deafness     Meningitis at age 56  . S/P appy   . Deaf   . H/O: C-section   . Ganglion cyst 06/22/2011   Past Surgical History  Procedure Laterality Date  . Appendectomy    . Tubal ligation    . Cesarean section    . Uterine fibroid embolization  2007  . Cardiolyte ef 77%, no ischemia in 2006  2006  . Wrist surgery      Cyst removed on left  . Wrist surgery Right 1985    tendon repair R wrist   Allergies  Allergen Reactions  . Sulfonamide Derivatives Swelling and Rash    REACTION: rash, swelling - "Lost BABY" - Terrible itching.   . Lipitor [Atorvastatin Calcium] Other (See Comments)    Muscle Aches - Mild-Moderate - completely resolved with D/C of atorva.   . Ramipril Other (See Comments)    REACTION: cough   Prior to Admission medications   Medication Sig Start Date End Date Taking? Authorizing Provider  aspirin (BAYER CHILDRENS ASPIRIN) 81 MG chewable tablet Chew 81 mg by mouth daily.     Yes Historical Provider, MD  bacitracin 500 UNIT/GM ointment Apply 1 application topically 2 (two) times daily. 09/04/14  Yes Patrecia Pour, MD  Blood Glucose Monitoring Suppl (ACCU-CHEK AVIVA) device Use as instructed 12/03/13 12/03/14 Yes Patrecia Pour, MD  Calcium Carbonate-Vitamin D (CALCIUM 600/VITAMIN D) 600-400 MG-UNIT  per chew tablet Chew 1 tablet by mouth 2 (two) times daily.  11/04/10  Yes Candelaria Celeste, MD  cholecalciferol (VITAMIN D) 1000 UNITS tablet Take 1,000 Units by mouth daily.   Yes Historical Provider, MD  cyanocobalamin 500 MCG tablet Take 500 mcg by mouth daily.   Yes Historical Provider, MD  diclofenac (VOLTAREN) 75 MG EC tablet Take 1 tablet (75 mg total) by mouth 2 (two) times daily as needed. 06/15/14  Yes Patrecia Pour, MD  Diclofenac Sodium 1 % CREA 1 application to painful joints qid 11/17/13  Yes Gerda Diss, DO  DUREZOL 0.05 % EMUL  12/26/13  Yes Historical Provider, MD  ferrous sulfate 325 (65 FE) MG  tablet Take 1 tablet (325 mg total) by mouth daily with breakfast. 10/01/10  Yes Clovis Cao, MD  fluticasone (FLONASE) 50 MCG/ACT nasal spray 2 sprays to each nostril daily. Use nasal saline before use. 04/21/11  Yes Deneise Lever, MD  gabapentin (NEURONTIN) 400 MG capsule Take 3 capsules (1,200 mg total) by mouth 2 (two) times daily. Take 2 cap twice daily and 3 caps at bedtime. 09/04/14  Yes Patrecia Pour, MD  glucose blood (ACCU-CHEK AVIVA PLUS) test strip CHECK SUGAR 4 TIMES A DAILY 08/11/14  Yes Lupita Dawn, MD  hydrochlorothiazide (HYDRODIURIL) 50 MG tablet TAKE 1 TABLET BY MOUTH EVERY DAY 07/08/14  Yes Patrecia Pour, MD  hyoscyamine (LEVSIN/SL) 0.125 MG SL tablet Take one tablet every 6 hours only for persistent cramping 03/20/14  Yes Janne Napoleon, NP  ibuprofen (ADVIL,MOTRIN) 600 MG tablet Take 1 tab every 6 hours as needed for pain 09/04/14  Yes Patrecia Pour, MD  insulin aspart (NOVOLOG) 100 UNIT/ML injection Inject 1-3 Units into the skin 3 (three) times daily before meals. 04/29/13  Yes Patrecia Pour, MD  insulin glargine (LANTUS) 100 UNIT/ML injection INJECT 6 UNITS SUBCUTANEOUSLY AT BEDTIME 05/26/14  Yes Patrecia Pour, MD  Lancets (ACCU-CHEK SOFT TOUCH) lancets Use as instructed 12/03/13  Yes Patrecia Pour, MD  losartan (COZAAR) 100 MG tablet Take 1 tablet (100 mg total) by mouth daily. 10/21/14  Yes Patrecia Pour, MD  metFORMIN (GLUCOPHAGE) 1000 MG tablet TAKE 1 TABLET BY MOUTH TWO TIMES DAILY. 10/12/14  Yes Patrecia Pour, MD  metoCLOPramide (REGLAN) 10 MG tablet Take 1 tablet (10 mg total) by mouth 3 (three) times daily before meals. 06/01/14  Yes Patrecia Pour, MD  metoprolol succinate (TOPROL-XL) 50 MG 24 hr tablet TAKE 1 TABLET BY MOUTH EVERY DAY *TAKE WITH OR IMMEDIATELY FOLLOWING A MEAL 05/06/14  Yes Patrecia Pour, MD  nortriptyline (PAMELOR) 25 MG capsule  10/06/14  Yes Historical Provider, MD  NOVOLOG 100 UNIT/ML injection TAKE AS DIRECTED PER GLUCOSE: 150-200 GIVE 2UNITS, IF 201-250 GIVE 3UNITS >250  GIVE 4 UNITS   Yes Patrecia Pour, MD  oxyCODONE-acetaminophen (ROXICET) 5-325 MG per tablet Take 0.5-1 tablets by mouth 2 (two) times daily as needed for severe pain. 06/08/14  Yes Sharon [address], MD  polyethylene glycol powder [facility]) powder TAKE 17 GRAMS MIXED IN LIQUID THREE TIMES A WEEK 04/17/14  Yes Patrecia Pour, MD   History   Social History  . Marital Status: Married    Spouse Name: N/A  . Number of Children: N/A  . Years of Education: N/A   Occupational History  . daycare    Social History Main Topics  . Smoking status: Never Smoker   . Smokeless tobacco: Never Used  .  Alcohol Use: Yes     Comment: rarely  . Drug Use: No  . Sexual Activity: Yes    Birth Control/ Protection: Surgical   Other Topics Concern  . Not on file   Social History Narrative   married lives w/ husband and three children;    Husband is deaf also, children are not;    Works in day care since 2000;    No tobacco,occ  EtOH, rec drugs    Review of Systems  Constitutional: Negative for fever.  Musculoskeletal: Positive for myalgias.       Objective:   Physical Exam  Constitutional: She is oriented to person, place, and time. She appears well-developed and well-nourished. No distress.  HENT:  Head: Normocephalic and atraumatic.  Eyes: EOM are normal.  Neck: Neck supple. No tracheal deviation present.  Cardiovascular: Normal rate.   Pulmonary/Chest: Effort normal. No respiratory distress.  Musculoskeletal: Normal range of motion.  Left arm: Elbow non tender full ROM. Guarded shoulder ROM. Tender along proximal humerus. C-spine full ROM. Clavicle, Redan and AS nontender.   Neurological: She is alert and oriented to person, place, and time.  Skin: Skin is warm and dry.  Psychiatric: She has a normal mood and affect. Her behavior is normal.  Nursing note and vitals reviewed.    Filed Vitals:   11/14/14 0908  BP: 138/60  Pulse: 61  Temp: 98 F (36.7 C)  TempSrc: Oral    Height: 5' 3.75" (1.619 m)  Weight: 165 lb 3.2 oz (74.934 kg)  SpO2: 96%   [facility] reading (PRIMARY) by  Dr. Carlota Raspberry: left humerus with no apparent fracture.       Assessment & Plan:   Alicia [name] is a 59 y.o. female Left upper arm pain - Plan: DG Humerus Left, HYDROcodone-acetaminophen (NORCO/VICODIN) 5-325 MG per tablet  -no apparent fracture, but guarded exam. Doubt fracture, but will place in sling for 1 week, then recheck exam +/- xray.  Consider ortho eval if pain persists.  -otc tylenol/motrin prn OR hydrocodone if increased pain.   Hearing difficulty of both ears  -understanding expressed with use of written communication and some lip reading.   Meds ordered this encounter  Medications  . HYDROcodone-acetaminophen (NORCO/VICODIN) 5-325 MG per tablet    Sig: Take 1 tablet by mouth every 6 (six) hours as needed for moderate pain.    Dispense:  15 tablet    Refill:  0   Patient Instructions  I do not see a fracture on your x-ray today. You may have pulled a muscle or there could be a subtle fracture that we are not able to see initially on x-ray. I would recommend wearing the arm splint this week and rechecking within the next week to 10 days with possible repeat x-ray or refer to Orthopaedic specialist if still having pain.  You can take over-the-counter Tylenol or Motrin as needed for pain OR if stronger medicine needed I prescribed hydrocodone. This medicine can cause sedation or dizziness so be careful if you have to take it for your pain. Do not combine the hydrocodone with other narcotic pain medicines like oxycodone.   Return to the clinic or go to the nearest emergency room if any of your symptoms worsen or new symptoms occur.

## 2014-12-08 ENCOUNTER — Ambulatory Visit (INDEPENDENT_AMBULATORY_CARE_PROVIDER_SITE_OTHER): Payer: Medicare Other | Admitting: Neurology

## 2014-12-08 ENCOUNTER — Encounter: Payer: Self-pay | Admitting: Neurology

## 2014-12-08 VITALS — BP 116/68 | HR 82 | Temp 97.3°F | Wt 169.2 lb

## 2014-12-08 DIAGNOSIS — R0683 Snoring: Secondary | ICD-10-CM | POA: Diagnosis not present

## 2014-12-08 DIAGNOSIS — R519 Headache, unspecified: Secondary | ICD-10-CM

## 2014-12-08 DIAGNOSIS — M791 Myalgia, unspecified site: Secondary | ICD-10-CM

## 2014-12-08 DIAGNOSIS — M2550 Pain in unspecified joint: Secondary | ICD-10-CM | POA: Diagnosis not present

## 2014-12-08 DIAGNOSIS — R270 Ataxia, unspecified: Secondary | ICD-10-CM | POA: Diagnosis not present

## 2014-12-08 DIAGNOSIS — W19XXXA Unspecified fall, initial encounter: Secondary | ICD-10-CM

## 2014-12-08 DIAGNOSIS — M359 Systemic involvement of connective tissue, unspecified: Secondary | ICD-10-CM | POA: Diagnosis not present

## 2014-12-08 DIAGNOSIS — G4719 Other hypersomnia: Secondary | ICD-10-CM | POA: Diagnosis not present

## 2014-12-08 DIAGNOSIS — E538 Deficiency of other specified B group vitamins: Secondary | ICD-10-CM

## 2014-12-08 DIAGNOSIS — H9193 Unspecified hearing loss, bilateral: Secondary | ICD-10-CM

## 2014-12-08 DIAGNOSIS — I776 Arteritis, unspecified: Secondary | ICD-10-CM | POA: Diagnosis not present

## 2014-12-08 DIAGNOSIS — R51 Headache: Secondary | ICD-10-CM

## 2014-12-08 NOTE — Patient Instructions (Signed)
Overall you are doing fairly well but I do want to suggest a few things today:   Remember to drink plenty of fluid, eat healthy meals and do not skip any meals. Try to eat protein with a every meal and eat a healthy snack such as fruit or nuts in between meals. Try to keep a regular sleep-wake schedule and try to exercise daily, particularly in the form of walking, 20-30 minutes a day, if you can.   As far as diagnostic testing: MRi of the brain and labs,sleep study  I would like to see you back in 3 months, sooner if we need to. Please call us with any interim questions, concerns, problems, updates or refill requests.   Please also call us for any test results so we can go over those with you on the phone.  My clinical assistant and will answer any of your questions and relay your messages to me and also relay most of my messages to you.   Our phone number is [phone]. We also have an after hours call service for urgent matters and there is a physician on-call for urgent questions. For any emergencies you know to call 911 or go to the nearest emergency room

## 2014-12-08 NOTE — Progress Notes (Signed)
[facility]    Provider:  Dr Jaynee Eagles Referring Provider: Patrecia Pour, MD Primary Care Physician:  Vance Gather, MD  CC:  Balance problems  HPI:  Alicia [name] is a 59 y.o. female here as a referral from Dr. Bonner Puna for Balance issues. PMHx DM, HTN, HLD, deafness. She is here with a sign language interpreter. She has balance problems. Balance problems started a long time ago and is getting worse. She has fallen. Not lightheaded or dizzy. Not feeling like she is on a boat. She feels clumsy walking. She falls over her feet, she ran into a tree outside. She tries to move but then falls in that direction. On the sidewalk she goes off in one direction and can't stop. No double vision, when she leans over to pick something up she feels like she is going to tumble over. No paresthesias. She does have neuropathic neuropathy. She has headaches when she has low sugar. Some stress headaches too happening more often, worsening. She has weakness in her hands like when using the phone, she loses her grip when she is driving. She feels off balance every day, continuous. At home in the hallway she runs into the walls in the hallway and she hits a corner as she turns. She has back pain which has resolved mostly. She can walk fast. She snores very loud. She is excessively tired during the day, low energy, she wants to sleep a lot and she nods off easily at work, has nodded off while doing artwork with the kids. She wakes up with morning headaches. She feels her memory is off. She has nocturnal awakenings. No family history of Ataxia.   Reviewed notes, labs and imaging from outside physicians, which showed:  Crp, CK , CMP, cbc, TSH unremarkable. HgbA1c 8.5.  Review of Systems: Patient complains of symptoms per HPI as well as the following symptoms: weight gain, blurred vision, easy bruising, diarrhea, constoipation, joint pain, aching muscles, allergies, runny nose, itching, memory loss, confusion,  headache, weakness, seizure Pertinent negatives per HPI. All others negative.   History   Social History  . Marital Status: Divorced    Spouse Name: N/A  . Number of Children: 3  . Years of Education: 12   Occupational History  . daycare    Social History Main Topics  . Smoking status: Never Smoker   . Smokeless tobacco: Never Used  . Alcohol Use: 0.0 oz/week    0 Standard drinks or equivalent per week     Comment: rarely- only wine, "2-3 times per week"  . Drug Use: No  . Sexual Activity: Yes    Birth Control/ Protection: Surgical   Other Topics Concern  . Not on file   Social History Narrative   Lives with two children   Children are not deaf    Works in day care since 2000;    No tobacco,occ  EtOH, rec drugs    Family History  Problem Relation Age of Onset  . Hypertension Mother   . Cancer Maternal Aunt   . Cancer Maternal Grandmother   . Diabetes Father     Past Medical History  Diagnosis Date  . Diabetes mellitus   . Hypertension   . Hyperlipidemia   . Gastroparesis   . Diabetic neuropathy   . GERD (gastroesophageal reflux disease)   . Complete deafness     Meningitis at age 78  . S/P appy   . Deaf   . H/O: C-section   .  Ganglion cyst 06/22/2011    Past Surgical History  Procedure Laterality Date  . Appendectomy    . Tubal ligation    . Cesarean section    . Uterine fibroid embolization  2007  . Cardiolyte ef 77%, no ischemia in 2006  2006  . Wrist surgery      Cyst removed on left  . Wrist surgery Right 1985    tendon repair R wrist    Current Outpatient Prescriptions  Medication Sig Dispense Refill  . aspirin (BAYER CHILDRENS ASPIRIN) 81 MG chewable tablet Chew 81 mg by mouth daily.      . bacitracin 500 UNIT/GM ointment Apply 1 application topically 2 (two) times daily. 15 g 0  . Calcium Carbonate-Vitamin D (CALCIUM 600/VITAMIN D) 600-400 MG-UNIT per chew tablet Chew 1 tablet by mouth 2 (two) times daily.     . cholecalciferol  (VITAMIN D) 1000 UNITS tablet Take 1,000 Units by mouth daily.    . cyanocobalamin 500 MCG tablet Take 500 mcg by mouth daily.    . diclofenac (VOLTAREN) 75 MG EC tablet Take 1 tablet (75 mg total) by mouth 2 (two) times daily as needed. 30 tablet 1  . DUREZOL 0.05 % EMUL     . fluticasone (FLONASE) 50 MCG/ACT nasal spray 2 sprays to each nostril daily. Use nasal saline before use. 16 g 11  . gabapentin (NEURONTIN) 400 MG capsule Take 3 capsules (1,200 mg total) by mouth 2 (two) times daily. Take 2 cap twice daily and 3 caps at bedtime. 180 capsule 3  . glucose blood (ACCU-CHEK AVIVA PLUS) test strip CHECK SUGAR 4 TIMES A DAILY 200 each 3  . hydrochlorothiazide (HYDRODIURIL) 50 MG tablet TAKE 1 TABLET BY MOUTH EVERY DAY 90 tablet 3  . HYDROcodone-acetaminophen (NORCO/VICODIN) 5-325 MG per tablet Take 1 tablet by mouth every 6 (six) hours as needed for moderate pain. 15 tablet 0  . ibuprofen (ADVIL,MOTRIN) 600 MG tablet Take 1 tab every 6 hours as needed for pain 90 tablet 0  . insulin aspart (NOVOLOG) 100 UNIT/ML injection Inject 1-3 Units into the skin 3 (three) times daily before meals. 1 vial 0  . insulin glargine (LANTUS) 100 UNIT/ML injection INJECT 6 UNITS SUBCUTANEOUSLY AT BEDTIME 10 mL 3  . Lancets (ACCU-CHEK SOFT TOUCH) lancets Use as instructed 200 each 12  . losartan (COZAAR) 100 MG tablet Take 1 tablet (100 mg total) by mouth daily. 90 tablet 3  . metFORMIN (GLUCOPHAGE) 1000 MG tablet TAKE 1 TABLET BY MOUTH TWO TIMES DAILY. 60 tablet 11  . metoCLOPramide (REGLAN) 10 MG tablet Take 1 tablet (10 mg total) by mouth 3 (three) times daily before meals. 90 tablet 2  . metoprolol succinate (TOPROL-XL) 50 MG 24 hr tablet TAKE 1 TABLET BY MOUTH EVERY DAY *TAKE WITH OR IMMEDIATELY FOLLOWING A MEAL 90 tablet 3  . nortriptyline (PAMELOR) 25 MG capsule     . oxyCODONE-acetaminophen (ROXICET) 5-325 MG per tablet Take 0.5-1 tablets by mouth 2 (two) times daily as needed for severe pain. 20 tablet 0    . polyethylene glycol powder (GLYCOLAX/MIRALAX) powder TAKE 17 GRAMS MIXED IN LIQUID THREE TIMES A WEEK 527 g 3  . Diclofenac Sodium 1 % CREA 1 application to painful joints qid (Patient not taking: Reported on 12/08/2014) 120 g 0  . ferrous sulfate 325 (65 FE) MG tablet Take 1 tablet (325 mg total) by mouth daily with breakfast.  3  . hyoscyamine (LEVSIN/SL) 0.125 MG SL tablet Take one tablet every  6 hours only for persistent cramping (Patient not taking: Reported on 12/08/2014) 10 tablet 0   No current facility-administered medications for this visit.    Allergies as of 12/08/2014 - Review Complete 12/08/2014  Allergen Reaction Noted  . Sulfonamide derivatives Swelling and Rash 12/07/2006  . Lipitor [atorvastatin calcium] Other (See Comments) 09/13/2010  . Ramipril Other (See Comments) 12/07/2006    Vitals: BP 116/68 mmHg  Pulse 82  Temp(Src) 97.3 F (36.3 C)  Wt 169 lb 3.2 oz (76.749 kg) Last Weight:  Wt Readings from Last 1 Encounters:  12/08/14 169 lb 3.2 oz (76.749 kg)   Last Height:   Ht Readings from Last 1 Encounters:  11/14/14 5' 3.75" (1.619 m)    Physical exam: Exam: Gen: NAD, conversant, well nourised, well groomed, overweight                CV: RRR, no MRG. No Carotid Bruits. No peripheral edema, warm, nontender Eyes: Conjunctivae clear without exudates or hemorrhage  Neuro: Detailed Neurologic Exam  Speech:    Speech is normal; fluent and spontaneous with normal comprehension.  Cognition:    The patient is oriented to person, place, and time;     recent and remote memory intact;     language fluent;     normal attention, concentration,     fund of knowledge Cranial Nerves:    The pupils are equal, round, and reactive to light. The right fundi are flat, coul dnot visualize the left. Visual fields are full to finger confrontation. Extraocular movements are intact. Trigeminal sensation is intact and the muscles of mastication are normal. The face is  symmetric. The palate elevates in the midline. Hearing intact. Voice is normal. Shoulder shrug is normal. The tongue has normal motion without fasciculations.   Coordination:    Normal finger to nose and heel to shin. Normal rapid alternating movements.   Gait:    Heel-toe and tandem gait are normal.   Motor Observation:    No asymmetry, no atrophy, and no involuntary movements noted. Tone:    Normal muscle tone.    Posture:    Posture is normal. normal erect    Strength:    Strength is V/V in the upper and lower limbs.      Sensation: intact to LT, pin prick, proprioception, temp, vibration distally     Reflex Exam:  DTR's: Absent achilles otherwise deep tendon reflexes in the upper and lower extremities are normal bilaterally.   Toes:    The toes are downgoing bilaterally.   Clonus:    Clonus is absent.    Assessment/Plan:  59 year old female with PMHx deafness who is here for ataxia and falls. Unclear etiology, her exam is normal. Diabetic neuropathy may be contributory however her sensory exam is normal. Will order MRi of the brain w/wo contrast. Will check labs such as b12. Will also order sleep test for snoring, excessive fatigue and nodding off at work, morning headaches.   ESS 19, FSS [place], MD  [facility] [address] [place], [place] [address]  Phone [phone] Fax [phone]

## 2014-12-09 LAB — B12 AND FOLATE PANEL
Folate: 5.5 ng/mL (ref >3.0)
Vitamin B-12: 1675 pg/mL — ABNORMAL HIGH (ref 211–946)

## 2014-12-09 LAB — ANA W/REFLEX: Anti Nuclear Antibody(ANA): NEGATIVE

## 2014-12-09 LAB — RHEUMATOID FACTOR: Rhuematoid fact SerPl-aCnc: 7.5 IU/mL (ref 0.0–13.9)

## 2014-12-10 ENCOUNTER — Telehealth: Payer: Self-pay | Admitting: *Deleted

## 2014-12-10 NOTE — Telephone Encounter (Signed)
Left VM for pt to let her know labs were normal. Interpretor line told me my message would be interpreted to pt. Gave pt [facility] phone number if she had any questions about lab work.

## 2014-12-19 ENCOUNTER — Inpatient Hospital Stay: Admission: RE | Admit: 2014-12-19 | Payer: Medicare Other | Source: Ambulatory Visit

## 2014-12-28 ENCOUNTER — Telehealth: Payer: Self-pay | Admitting: Family Medicine

## 2014-12-28 NOTE — Telephone Encounter (Signed)
Patient set up appt. No note needed / closing encounter Alicia [name], ASA

## 2015-01-06 ENCOUNTER — Telehealth: Payer: Self-pay | Admitting: *Deleted

## 2015-01-06 NOTE — Telephone Encounter (Signed)
Pt called with help of sign language interpreter.  Saying that after her last visit she had a reaction from the injection.  Her hand/arm has a rash and is sore.  Said she could take benedryl and see if that would help.  Is there anything else we could offer her, or does she need to come in for an office visit?

## 2015-01-06 NOTE — Telephone Encounter (Signed)
I would like her to be seen ASAP [facility]! Alicia [name]

## 2015-01-08 ENCOUNTER — Ambulatory Visit (INDEPENDENT_AMBULATORY_CARE_PROVIDER_SITE_OTHER): Payer: Medicare Other | Admitting: Family Medicine

## 2015-01-08 ENCOUNTER — Encounter: Payer: Self-pay | Admitting: Family Medicine

## 2015-01-08 VITALS — BP 132/61 | HR 87 | Ht 63.0 in | Wt 169.0 lb

## 2015-01-08 DIAGNOSIS — M1812 Unilateral primary osteoarthritis of first carpometacarpal joint, left hand: Secondary | ICD-10-CM

## 2015-01-08 DIAGNOSIS — M79645 Pain in left finger(s): Secondary | ICD-10-CM | POA: Insufficient documentation

## 2015-01-08 MED ORDER — TRIAMCINOLONE ACETONIDE 0.025 % EX LOTN: TOPICAL_LOTION | CUTANEOUS | Status: DC

## 2015-01-08 NOTE — Progress Notes (Signed)
Subjective:     Patient ID: Alicia [name], female   DOB: 1955-08-13, 59 y.o.   MRN: [id number]  HPI  Use of interpreter for the visit.  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 She states that the skin over her the dorsal aspect of her left thumb has been pruritic since her last corticosteroid injection in May 2016. She also complains of heat sensitivity over the area. She has no documented reaction to steroid injections in the past, but says that she had a few days of pruritis after injections in the past, but nothing that has lasted this long. She tried benedryl once or twice with no relief. She has not tried any other ointments or creams.   Review of Systems per HPI.   Objective:   Physical Exam General: NAD Skin: single 2 mm pink papule on skin overlying left thumb MCP joint. Hypopigmented skin overlying dorsal aspect of MCP.  No erythema or swelling. Skin appears dry.  MSK: Left thumb:  Full ROM. No pain with active resistance of flexion and extension. No thenar atrophy is noted. No pain to palpation over left MCP joint. Neurovascularly intact.    Assessment:     See problem based charting & AVS for pt instructions.    Plan:     See problem based charting & AVS for pt instructions.

## 2015-01-08 NOTE — Progress Notes (Signed)
Patient ID: Alicia [name], female   DOB: 05-May-1956, 59 y.o.   MRN: [id number]   Interpreter for visit is Carlisle Cater

## 2015-01-08 NOTE — Assessment & Plan Note (Signed)
Significant joint loss with many osteophytes seen on U/S in October 2012. She reports that she did not have a response to the past corticosteroid injection in May 2016. Given that the injections no longer appear to be working, her next step will be meeting with a hand surgeon to discuss joint replacement.

## 2015-01-08 NOTE — Progress Notes (Signed)
Patient ID: Alicia [name], female   DOB: May 13, 1956, 59 y.o.   MRN: [id number] Katy Attending Note: I have seen and examined this patient with the medical student. I have  reviewed the history, physical examination, assessment and plan as documented in the medical student's note.  I agree with the medical student's note and findings, assessment and treatment plan as documented with the following additions or changes: Mild hypopigmentation at injection site on right thumb MCP. This is most likely from multiple serial injections with corticosteroid-induced. Her movement today is that she quite good. She has some dry skin bilaterally and it's unclear to me that the itching sensation she's having is related at all to any of her previous injections. I will treat with some topical cortico-sterile lotion. Hopefully this will improve her itching in not increase her hypopigmentation. We discussed at length whether or not any future injections would be beneficial to her. Certainly does not seem as if they are working as well as they used to. Also briefly discussed possible referral to hand surgeon in the future for further evaluation, possible consideration of MCP surgery. She does not want to consider that at this time. She will follow-up when necessary.

## 2015-01-08 NOTE — Assessment & Plan Note (Signed)
--   Patient was advised to apply triamcinolone to left thumb area twice a day for two weeks then daily PRN. -- follow up in 1 month if symptoms do not improve

## 2015-01-14 ENCOUNTER — Telehealth: Payer: Self-pay | Admitting: Neurology

## 2015-01-14 ENCOUNTER — Ambulatory Visit (INDEPENDENT_AMBULATORY_CARE_PROVIDER_SITE_OTHER): Payer: Medicare Other | Admitting: Neurology

## 2015-01-14 ENCOUNTER — Encounter: Payer: Self-pay | Admitting: Neurology

## 2015-01-14 VITALS — BP 126/60 | HR 8 | Resp 20 | Ht 64.0 in | Wt 174.0 lb

## 2015-01-14 DIAGNOSIS — H9193 Unspecified hearing loss, bilateral: Secondary | ICD-10-CM | POA: Diagnosis not present

## 2015-01-14 DIAGNOSIS — G4719 Other hypersomnia: Secondary | ICD-10-CM | POA: Diagnosis not present

## 2015-01-14 DIAGNOSIS — R0683 Snoring: Secondary | ICD-10-CM

## 2015-01-14 NOTE — Telephone Encounter (Signed)
Per Dawn in sleep lab, Medicare covers 80% of sleep study, which would leave pt having to cover about $300 of it. If she needed to come in again for a cpap titration, it would be another $300. Pt states that this is too much for her at this time.

## 2015-01-14 NOTE — Progress Notes (Signed)
[facility]    Provider:  Dr Jaynee Eagles Referring Provider: Patrecia Pour, MD Primary Care Physician:  Vance Gather, MD  CC:  Balance problems  HPI:  Alicia [name] is a 59 y.o. female here as a referral from Dr. Bonner Puna for Balance issues. PMHx DM, HTN, HLD, deafness. She is here with a sign language interpreter. She has balance problems. Balance problems started a long time ago and is getting worse. She has fallen. Not lightheaded or dizzy. Not feeling like she is on a boat. She feels clumsy walking. She falls over her feet, she ran into a tree outside. She tries to move but then falls in that direction. On the sidewalk she goes off in one direction and can't stop. No double vision, when she leans over to pick something up she feels like she is going to tumble over. No paresthesias. She does have neuropathic neuropathy. She has headaches when she has low sugar. Some stress headaches too happening more often, worsening. She has weakness in her hands like when using the phone, she loses her grip when she is driving. She feels off balance every day, continuous. At home in the hallway she runs into the walls in the hallway and she hits a corner as she turns. She has back pain which has resolved mostly. She can walk fast. She snores very loud. She is excessively tired during the day, low energy, she wants to sleep a lot and she nods off easily at work, has nodded off while doing artwork with the kids. She wakes up with morning headaches. She feels her memory is off. She has nocturnal awakenings. No family history of Ataxia.   Reviewed notes, labs and imaging from outside physicians, which showed:  Crp, CK , CMP, cbc, TSH unremarkable. HgbA1c 8.5.  01-14-15  Sleep consult for Dr Jaynee Eagles, Alicia [name], 59 year old African-American female described as right handed with a history of profound sensorineural deafness presents today for a sleep consultation. She reports that she usually goes to bed  between 10 and 11 rarely later. She states that there is a great variability in her sleep latency sometimes she will promptly go to sleep and other times would struggle to fall asleep. But after she falls asleep initially she may wake up frequently and she struggles to reinitiate sleep. She is diabetic and she has a lot of bathroom breaks that she seems to feel are the main cause fragmenting her sleep. The patient works at a daycare center. She does not require an alarm clock she works up spontaneously at 6 AM , she lives  independent. Son and daughter are in the same household.  During her marriage she states that she needed an alarm clock or was relying more on others. She is divorced.  The patient also reports that when she wakes up the headache is often present but the headache is not the cause for her waking up. She denies waking up with a dry mouth. She sometimes has blurred visions and eye itching she is constipation and a runny nose. Does have allergic rhinitis but almost perennially. She prefers to sleep on her left side. She usually wakes up still being on her left side sometimes in supine. Sometimes her legs will itch but she does not describes the irresistible urge to move as seen in restless leg syndrome. Overall she would estimate her nocturnal sleep time to be 6 hours or less.  She reports no vivid dreams, no sleep paralysis and no dream  intrusions. No cataplexy .        Review of Systems: Patient complains of symptoms per HPI as well as the following symptoms: weight gain, blurred vision, easy bruising, diarrhea, constoipation, joint pain, aching muscles, allergies, runny nose, itching, memory loss, confusion, headache, weakness, seizure Pertinent negatives per HPI. All others negative.  The patient today endorsed the Epworth sleepiness score at 24 points. Fatigue severity score at 30 points.  Last HG A1c was 8.5 - she was seen by Dr. Jaynee Eagles on 12-08-14. History   Social History  .  Marital Status: Divorced    Spouse Name: N/A  . Number of Children: 3  . Years of Education: 12   Occupational History  . daycare    Social History Main Topics  . Smoking status: Never Smoker   . Smokeless tobacco: Never Used  . Alcohol Use: 0.0 oz/week    0 Standard drinks or equivalent per week     Comment: rarely- only wine, "2-3 times per week"  . Drug Use: No  . Sexual Activity: Yes    Birth Control/ Protection: Surgical   Other Topics Concern  . Not on file   Social History Narrative   Lives with two children   Children are not deaf    Works in day care since 2000;    No tobacco,occ  EtOH, rec drugs      Drinks 2-3 caffeinated drinks daily.    Family History  Problem Relation Age of Onset  . Hypertension Mother   . Cancer Maternal Aunt   . Cancer Maternal Grandmother   . Diabetes Father     Past Medical History  Diagnosis Date  . Diabetes mellitus   . Hypertension   . Hyperlipidemia   . Gastroparesis   . Diabetic neuropathy   . GERD (gastroesophageal reflux disease)   . Complete deafness     Meningitis at age 61  . S/P appy   . Deaf   . H/O: C-section   . Ganglion cyst 06/22/2011    Past Surgical History  Procedure Laterality Date  . Appendectomy    . Tubal ligation    . Cesarean section    . Uterine fibroid embolization  2007  . Cardiolyte ef 77%, no ischemia in 2006  2006  . Wrist surgery      Cyst removed on left  . Wrist surgery Right 1985    tendon repair R wrist    Current Outpatient Prescriptions  Medication Sig Dispense Refill  . aspirin (BAYER CHILDRENS ASPIRIN) 81 MG chewable tablet Chew 81 mg by mouth daily.      . bacitracin 500 UNIT/GM ointment Apply 1 application topically 2 (two) times daily. 15 g 0  . Calcium Carbonate-Vitamin D (CALCIUM 600/VITAMIN D) 600-400 MG-UNIT per chew tablet Chew 1 tablet by mouth 2 (two) times daily.     . cholecalciferol (VITAMIN D) 1000 UNITS tablet Take 1,000 Units by mouth daily.    .  cyanocobalamin 500 MCG tablet Take 500 mcg by mouth daily.    . diclofenac (VOLTAREN) 75 MG EC tablet Take 1 tablet (75 mg total) by mouth 2 (two) times daily as needed. 30 tablet 1  . Diclofenac Sodium 1 % CREA 1 application to painful joints qid 120 g 0  . DUREZOL 0.05 % EMUL     . ferrous sulfate 325 (65 FE) MG tablet Take 1 tablet (325 mg total) by mouth daily with breakfast.  3  . fluticasone (FLONASE) 50 MCG/ACT nasal  spray 2 sprays to each nostril daily. Use nasal saline before use. 16 g 11  . gabapentin (NEURONTIN) 400 MG capsule Take 3 capsules (1,200 mg total) by mouth 2 (two) times daily. Take 2 cap twice daily and 3 caps at bedtime. 180 capsule 3  . glucose blood (ACCU-CHEK AVIVA PLUS) test strip CHECK SUGAR 4 TIMES A DAILY 200 each 3  . hydrochlorothiazide (HYDRODIURIL) 50 MG tablet TAKE 1 TABLET BY MOUTH EVERY DAY 90 tablet 3  . HYDROcodone-acetaminophen (NORCO/VICODIN) 5-325 MG per tablet Take 1 tablet by mouth every 6 (six) hours as needed for moderate pain. 15 tablet 0  . hyoscyamine (LEVSIN/SL) 0.125 MG SL tablet Take one tablet every 6 hours only for persistent cramping 10 tablet 0  . ibuprofen (ADVIL,MOTRIN) 600 MG tablet Take 1 tab every 6 hours as needed for pain 90 tablet 0  . insulin aspart (NOVOLOG) 100 UNIT/ML injection Inject 1-3 Units into the skin 3 (three) times daily before meals. 1 vial 0  . insulin glargine (LANTUS) 100 UNIT/ML injection INJECT 6 UNITS SUBCUTANEOUSLY AT BEDTIME 10 mL 3  . Lancets (ACCU-CHEK SOFT TOUCH) lancets Use as instructed 200 each 12  . losartan (COZAAR) 100 MG tablet Take 1 tablet (100 mg total) by mouth daily. 90 tablet 3  . metFORMIN (GLUCOPHAGE) 1000 MG tablet TAKE 1 TABLET BY MOUTH TWO TIMES DAILY. 60 tablet 11  . metoCLOPramide (REGLAN) 10 MG tablet Take 1 tablet (10 mg total) by mouth 3 (three) times daily before meals. 90 tablet 2  . metoprolol succinate (TOPROL-XL) 50 MG 24 hr tablet TAKE 1 TABLET BY MOUTH EVERY DAY *TAKE WITH OR  IMMEDIATELY FOLLOWING A MEAL 90 tablet 3  . nortriptyline (PAMELOR) 25 MG capsule     . oxyCODONE-acetaminophen (ROXICET) 5-325 MG per tablet Take 0.5-1 tablets by mouth 2 (two) times daily as needed for severe pain. 20 tablet 0  . polyethylene glycol powder (GLYCOLAX/MIRALAX) powder TAKE 17 GRAMS MIXED IN LIQUID THREE TIMES A WEEK 527 g 3  . Triamcinolone Acetonide 0.025 % LOTN Apply a small amount twice daily to thumb area for 2 weeks then daily prn 60 mL 1   No current facility-administered medications for this visit.    Allergies as of 01/14/2015 - Review Complete 01/14/2015  Allergen Reaction Noted  . Sulfonamide derivatives Swelling and Rash 12/07/2006  . Lipitor [atorvastatin calcium] Other (See Comments) 09/13/2010  . Ramipril Other (See Comments) 12/07/2006    Vitals: BP 126/60 mmHg  Pulse 8  Resp 20  Ht 5\' 4"  (1.626 m)  Wt 174 lb (78.926 kg)  BMI 29.85 kg/m2 Last Weight:  Wt Readings from Last 1 Encounters:  01/14/15 174 lb (78.926 kg)   Last Height:   Ht Readings from Last 1 Encounters:  01/14/15 5\' 4"  (1.626 m)    Physical exam: Exam:  This is rest or has mild retrognathia, a Mallampati grade 2 and a neck circumference of 14 inches. Her tongue does not vesiculate her uvula is in midline. She has a normal neck position and musculature. There is no goiter. No TMJ click. Gen: NAD, conversant, well nourised, well groomed, overweight                CV: RRR, no MRG. No Carotid Bruits. No peripheral edema, warm, nontender Eyes: Conjunctivae clear without exudates or hemorrhage  Neuro: Detailed Neurologic Exam  Speech:    Speech is normal; fluent and spontaneous with normal comprehension.  Cognition:    The patient is oriented  to person, place, and time;     recent and remote memory intact;    DEAF -   normal attention, concentration,     fund of knowledge Cranial Nerves:    The pupils are equal, round, and reactive to light. The fundi are flat,  Visual fields  are full to finger confrontation. Extraocular movements are intact. Trigeminal sensation is intact and the muscles of mastication are normal. The face is symmetric. The palate elevates in the midline. Hearing -absent , she lost hearing at 59 years of age after a meningitis.   Voice is normal. Shoulder shrug is normal. The tongue has normal motion without fasciculations.   Coordination:    Normal finger to nose and heel to shin.  Normal rapid alternating movements.   Gait:    Heel-toe and tandem gait are normal.   Motor Observation:    No asymmetry, no atrophy, and no involuntary movements noted. Tone:    Normal muscle tone.    Posture:    Posture is normal. normal erect    Strength:    Strength is V/V in the upper and lower limbs.      Sensation: intact to LT, pin prick, proprioception, temp, vibration distally     Reflex Exam:  DTR's: Absent achilles otherwise deep tendon reflexes in the upper and lower extremities are normal bilaterally.   Toes:    The toes are downgoing bilaterally.   Clonus:    Clonus is absent.    Assessment/Plan:  59 year old female with PMHx deafness who presented originally  here for ataxia and falls. Diabetic neuropathy may be contributory however her sensory exam is normal.. Her children have reported her to snore loudly and have witnessed irregular breathing at night. Even when she nods off and this has happened during work hours she has been told that she snores. She sometimes wakes herself from a choking sensation. Her frequent nocturia which may be related to diabetes but can be caused by obstructive sleep apnea as well. She does have regular sleep habits which will make the evaluation for sleep apnea easier. Since she wakes up with headaches but not from headaches I am concerned that she may be a CO2 retainer. I would like to obtain a No graph was her sleep study. The patient will need a translator if we perform a attended sleep study. We will  discuss with her insurance if this is covered otherwise she will have a home sleep test and I would like for her to be set up in the presence of a translator.  ESS 19, FSS 41    [facility] [address] [place], [place] [address]  Phone [phone] Fax [phone]

## 2015-01-14 NOTE — Patient Instructions (Signed)
Polysomnography (Sleep Studies) Polysomnography (PSG) is a series of tests used for detecting (diagnosing) obstructive sleep apnea and other sleep disorders. The tests measure how some parts of your body are working while you are sleeping. The tests are extensive and expensive. They are done in a sleep lab or hospital, and vary from center to center. Your caregiver may perform other more simple sleep studies and questionnaires before doing more complete and involved testing. Testing may not be covered by insurance. Some of these tests are:  An EEG (Electroencephalogram). This tests your brain waves and stages of sleep.  An EOG (Electrooculogram). This measures the movements of your eyes. It detects periods of REM (rapid eye movement) sleep, which is your dream sleep.  An EKG (Electrocardiogram). This measures your heart rhythm.  EMG (Electromyography). This is a measurement of how the muscles are working in your upper airway and your legs while sleeping.  An oximetry measurement. It measures how much oxygen (air) you are getting while sleeping.  Breathing efforts may be measured. The same test can be interpreted (understood) differently by different caregivers and centers that study sleep.  Studies may be given an apnea/hypopnea index (AHI). This is a number which is found by counting the times of no breathing or under breathing during the night, and relating those numbers to the amount of time spent in bed. When the AHI is greater than 15, the patient is likely to complain of daytime sleepiness. When the AHI is greater than 30, the patient is at increased risk for heart problems and must be followed more closely. Following the AHI also allows you to know how treatment is working. Simple oximetry (tracking the amount of oxygen that is taken in) can be used for screening patients who:  Do not have symptoms (problems) of OSA.  Have a normal Epworth Sleepiness Scale Score.  Have a low pre-test  probability of having OSA.  Have none of the upper airway problems likely to cause apnea.  Oximetry is also used to determine if treatment is effective in patients who showed significant desaturations (not getting enough oxygen) on their home sleep study. One extra measure of safety is to perform additional studies for the person who only snores. This is because no one can predict with absolute certainty who will have OSA. Those who show significant desaturations (not getting enough oxygen) are recommended to have a more detailed sleep study. Document Released: 12/24/2002 Document Revised: 09/11/2011 Document Reviewed: 08/25/2013 ExitCare Patient Information 2015 [employer]. This information is not intended to replace advice given to you by your health care provider. Make sure you discuss any questions you have with your health care provider.  

## 2015-01-14 NOTE — Telephone Encounter (Signed)
Patient stopped by the sleep lab and decline the sleep study due to the cost.

## 2015-01-18 ENCOUNTER — Other Ambulatory Visit: Payer: Self-pay | Admitting: Neurology

## 2015-01-18 DIAGNOSIS — R51 Headache: Secondary | ICD-10-CM

## 2015-01-18 DIAGNOSIS — R270 Ataxia, unspecified: Secondary | ICD-10-CM

## 2015-01-18 DIAGNOSIS — H9190 Unspecified hearing loss, unspecified ear: Secondary | ICD-10-CM

## 2015-01-18 DIAGNOSIS — W19XXXA Unspecified fall, initial encounter: Secondary | ICD-10-CM

## 2015-01-18 DIAGNOSIS — R519 Headache, unspecified: Secondary | ICD-10-CM

## 2015-01-21 ENCOUNTER — Ambulatory Visit (INDEPENDENT_AMBULATORY_CARE_PROVIDER_SITE_OTHER): Payer: Medicare Other | Admitting: Family Medicine

## 2015-01-21 ENCOUNTER — Encounter: Payer: Self-pay | Admitting: Family Medicine

## 2015-01-21 ENCOUNTER — Telehealth: Payer: Self-pay | Admitting: *Deleted

## 2015-01-21 VITALS — BP 142/69 | HR 77 | Temp 97.8°F | Ht 64.0 in | Wt 173.2 lb

## 2015-01-21 DIAGNOSIS — E7800 Pure hypercholesterolemia, unspecified: Secondary | ICD-10-CM

## 2015-01-21 DIAGNOSIS — Z114 Encounter for screening for human immunodeficiency virus [HIV]: Secondary | ICD-10-CM

## 2015-01-21 DIAGNOSIS — E118 Type 2 diabetes mellitus with unspecified complications: Secondary | ICD-10-CM

## 2015-01-21 DIAGNOSIS — G720 Drug-induced myopathy: Secondary | ICD-10-CM

## 2015-01-21 DIAGNOSIS — T466X5A Adverse effect of antihyperlipidemic and antiarteriosclerotic drugs, initial encounter: Secondary | ICD-10-CM

## 2015-01-21 DIAGNOSIS — I10 Essential (primary) hypertension: Secondary | ICD-10-CM

## 2015-01-21 LAB — POCT GLYCOSYLATED HEMOGLOBIN (HGB A1C): Hemoglobin A1C: 9

## 2015-01-21 NOTE — Assessment & Plan Note (Signed)
At goal < 135/80 today on manual recheck. Continue current therapy. Check labs.

## 2015-01-21 NOTE — Assessment & Plan Note (Signed)
Hb A1c worsening, now 9, due to worsening dietary habits due to stress accompanying ~40lbs weight gain in 11 months. Will increase basal insulin per directions to patient (up 1 unit for every 3 consecutive AM fasting CBGs >150, 1 lower for < 80) because all readings are elevated. Emphasized diet control. Follow up 3 months

## 2015-01-21 NOTE — Progress Notes (Signed)
Subjective: Alicia [name] is a 59 y.o. female here for diabetes follow up.   Takes lantus 6 units once daily and novolog at meal times based on a sliding scale though she can't remember what it usually says for her to give. She thinks she usually gives 3 - 4 units TID, reporting 90% compliance. Average AM fasting CBG: Are frequently elevated 180s - 200s though had a 43 and an 80. in the past 6 months because she didn't eat dinner. She has gained a lot of weight over the past 6 months (in fact was 135lbs Aug 2015, 173lbs today) due to stresses and endorses frequently eating foods rich in sugars and carbs.   She is able to explain common hypoglycemic symptoms and appropriate action plan in the event of these symptoms/low CBG.   - ROS: Denies fever, chills, weight loss, dizziness, vision changes, syncope, polyuria, nocturia, paresthesias, polydipsia, chest pain, new wounds.  All other systems reviewed and are negative. - [place]: lives alone, has good relationship with ASL interpretor who is present throughout visit, non-smoker, no EtOH, no illicit drugs. - Medications: reviewed and updated  Objective: BP 142/69 mmHg  Pulse 77  Temp(Src) 97.8 F (36.6 C) (Oral)  Ht 5\' 4"  (1.626 m)  Wt 173 lb 3.2 oz (78.563 kg)  BMI 29.72 kg/m2 Gen: Well-appearing 59 y.o.female in no distress HEENT: Normocephalic, sclerae/conjunctivae clear, PERRL, MMM, posterior oropharynx clear, fair dentition Neck: Neck supple, no masses or lymphadenopathy; thyroid not enlarged  Pulm: Non-labored; CTAB, no wheezes  CV: Regular rate, no murmur appreciated; no LE edema, no JVD GI: Normoactive BS; soft, non-tender, non-distended, no HSM Skin: No wounds or rashes, no acanthosis nigricans See diabetic foot exam - simple  Neuro: CN II-XII without deficits, sensation intact to light touch, steady gait. Lab Results  Component Value Date   K 4.4 09/28/2014   Lab Results  Component Value Date   CREATININE 0.61 09/28/2014    Lab Results  Component Value Date   HGBA1C 9.0 01/21/2015   Lab Results  Component Value Date   CHOL 155 01/29/2014   HDL 73 01/29/2014   LDLCALC 75 01/29/2014   LDLDIRECT 78 07/10/2013   TRIG 34 01/29/2014   CHOLHDL 2.1 01/29/2014   Assessment & Plan: Alicia [name] is a 59 y.o. female here for IDDM with worsening control.   See problem list for problem-specific plans.  - Follow up soon for pap (last normal w/o cotesting 10/17/2011)

## 2015-01-21 NOTE — Assessment & Plan Note (Signed)
Limiting statin use, failed lipitor and now crestor. Will check lipids but discussed every other day dosing and/or lower potency statin e.g. simva- or prava-. Will address in 3 mo follow up.

## 2015-01-21 NOTE — Telephone Encounter (Signed)
LM with sign language line for her to return call to let her know that the labs we talked about her getting today, that she could just have them done when she comes in for her physical on August 18.  There is no need for her to make an additional trip for these labs since she has that appointment already. Katharina Caper, April D, [place]

## 2015-01-21 NOTE — Patient Instructions (Signed)
Thank you for coming in today!  We are checking some labs today, and we'll call you if they are abnormal. If you do not hear from me by phone or letter in 2 weeks, please call us as we may have been unable to reach you.   Increase your lantus insulin as follows: When your morning blood sugar is over 150 mg/dl for 3 consecutive days, increase lantus by 1 unit. If you get to 12 units in the morning before we follow up in 3 months you need to get ahold of my office and we need to have another appointment. If you have blood sugars that are below 80 in the morning OR if you have symptoms like dizziness, sweating, palpitations, and your blood sugar is low you need to eat some form of carbohydrates - candy, bread, etc.   - Make an appointment to come back to have a pap smear. - Bring a log of your blood sugars and insulin (both lantus and novolog) to the appointment in 3 months.   Our clinic's number is [phone]. Feel free to call any time with questions or concerns. We will answer any questions after hours with our 24-hour emergency line at that number as well.   - Dr. Bonner Puna

## 2015-01-25 NOTE — Telephone Encounter (Signed)
Patient called, I informed her of message below. Expressed understanding. Thanks, [employer], ASA

## 2015-01-26 ENCOUNTER — Ambulatory Visit
Admission: RE | Admit: 2015-01-26 | Discharge: 2015-01-26 | Disposition: A | Discharge status: Home / Self Care | Payer: Medicare Other | Source: Ambulatory Visit | Attending: Neurology | Admitting: Neurology

## 2015-01-26 DIAGNOSIS — R51 Headache: Secondary | ICD-10-CM

## 2015-01-26 DIAGNOSIS — H9190 Unspecified hearing loss, unspecified ear: Secondary | ICD-10-CM | POA: Diagnosis not present

## 2015-01-26 DIAGNOSIS — R519 Headache, unspecified: Secondary | ICD-10-CM

## 2015-01-26 DIAGNOSIS — R270 Ataxia, unspecified: Secondary | ICD-10-CM

## 2015-01-26 DIAGNOSIS — W19XXXA Unspecified fall, initial encounter: Secondary | ICD-10-CM

## 2015-01-26 MED ORDER — GADOBENATE DIMEGLUMINE 529 MG/ML IV SOLN
16.0000 mL | Freq: Once | INTRAVENOUS | Status: AC | PRN
  Administered 2015-01-26: 16 mL via INTRAVENOUS

## 2015-02-02 ENCOUNTER — Telehealth: Payer: Self-pay | Admitting: *Deleted

## 2015-02-02 ENCOUNTER — Other Ambulatory Visit: Payer: Self-pay | Admitting: Family Medicine

## 2015-02-02 NOTE — Telephone Encounter (Signed)
Called pt's home number and left a message asking her to call me back. It went through an answering service for the deaf.

## 2015-02-02 NOTE — Telephone Encounter (Signed)
-----   Message from Larey Seat, MD sent at 02/01/2015  4:39 PM EDT ----- No change from before, CD

## 2015-02-02 NOTE — Telephone Encounter (Signed)
-----   Message from Melvenia Beam, MD sent at 01/29/2015  5:18 PM EDT ----- Charlies Constable - looks like I received this result but it is Dr. Edwena Felty patient. Erasmo Downer, can you make sure Dr. Brett Fairy sees this? Thanks.

## 2015-02-03 NOTE — Telephone Encounter (Signed)
Called pt and left another message on her home phone asking her to call me back.

## 2015-02-09 ENCOUNTER — Other Ambulatory Visit: Payer: Self-pay | Admitting: Family Medicine

## 2015-02-15 ENCOUNTER — Telehealth: Payer: Self-pay

## 2015-02-15 NOTE — Progress Notes (Signed)
Letter was mailed to pt alerting her that her MRI showed no change from before, per Dr. Brett Fairy. Advised pt to call us for questions or concerns.

## 2015-02-15 NOTE — Telephone Encounter (Signed)
-----   Message from Larey Seat, MD sent at 02/01/2015  4:39 PM EDT ----- No change from before, CD

## 2015-02-15 NOTE — Telephone Encounter (Signed)
Letter was sent to pt alerting her that her MRI brian was unchanged since last none and to call the office for questions and concerns.

## 2015-02-18 ENCOUNTER — Other Ambulatory Visit (HOSPITAL_COMMUNITY)
Admission: RE | Admit: 2015-02-18 | Discharge: 2015-02-18 | Disposition: A | Discharge status: Home / Self Care | Payer: Medicare Other | Source: Ambulatory Visit | Attending: Family Medicine | Admitting: Family Medicine

## 2015-02-18 ENCOUNTER — Ambulatory Visit (INDEPENDENT_AMBULATORY_CARE_PROVIDER_SITE_OTHER): Payer: Medicare Other | Admitting: Family Medicine

## 2015-02-18 ENCOUNTER — Encounter: Payer: Self-pay | Admitting: Family Medicine

## 2015-02-18 VITALS — BP 127/61 | HR 72 | Temp 97.8°F | Ht 64.0 in | Wt 169.6 lb

## 2015-02-18 DIAGNOSIS — Z1151 Encounter for screening for human papillomavirus (HPV): Secondary | ICD-10-CM | POA: Diagnosis not present

## 2015-02-18 DIAGNOSIS — I10 Essential (primary) hypertension: Secondary | ICD-10-CM

## 2015-02-18 DIAGNOSIS — G720 Drug-induced myopathy: Secondary | ICD-10-CM | POA: Diagnosis not present

## 2015-02-18 DIAGNOSIS — Z01419 Encounter for gynecological examination (general) (routine) without abnormal findings: Secondary | ICD-10-CM | POA: Insufficient documentation

## 2015-02-18 DIAGNOSIS — E118 Type 2 diabetes mellitus with unspecified complications: Secondary | ICD-10-CM

## 2015-02-18 DIAGNOSIS — Z114 Encounter for screening for human immunodeficiency virus [HIV]: Secondary | ICD-10-CM | POA: Diagnosis not present

## 2015-02-18 DIAGNOSIS — T466X5A Adverse effect of antihyperlipidemic and antiarteriosclerotic drugs, initial encounter: Secondary | ICD-10-CM

## 2015-02-18 DIAGNOSIS — E7800 Pure hypercholesterolemia, unspecified: Secondary | ICD-10-CM

## 2015-02-18 DIAGNOSIS — Z1159 Encounter for screening for other viral diseases: Secondary | ICD-10-CM | POA: Diagnosis not present

## 2015-02-18 DIAGNOSIS — Z124 Encounter for screening for malignant neoplasm of cervix: Secondary | ICD-10-CM

## 2015-02-18 LAB — LIPID PANEL
Cholesterol: 227 mg/dL — ABNORMAL HIGH (ref 125–200)
HDL: 102 mg/dL (ref >=46)
LDL Cholesterol: 112 mg/dL (ref <130)
Total CHOL/HDL Ratio: 2.2 Ratio (ref <=5.0)
Triglycerides: 66 mg/dL (ref <150)
VLDL: 13 mg/dL (ref <30)

## 2015-02-18 MED ORDER — ROSUVASTATIN CALCIUM 5 MG PO TABS: 5.0000 mg | ORAL_TABLET | ORAL | Status: DC

## 2015-02-18 NOTE — Assessment & Plan Note (Addendum)
H/o statin intolerance (myopathy, not rhabdo). Discussed importance of statin therapy at maximum tolerated dose. Will start crestor 5mg  every other day, directions to space to every 3rd day if any symptoms arise, every 4th if continued symptoms and so on. CBGs sound improved. F/u with A1c late Oct.   LATE ADDENDUM: Hb A1c 9% at office visit signaling worsening control. Will need follow up to discuss blood sugars. Pt rarely provides records of CBGs but her report at this office visit is not congruent with Hb A1c of 9% (average ~210mg /dL). Needs to continue checking QID.

## 2015-02-18 NOTE — Assessment & Plan Note (Signed)
At goal today without orthostasis. Continue therapies.

## 2015-02-18 NOTE — Patient Instructions (Signed)
Thank you for coming in today!  We are checking some labs today, and we'll call you if they are abnormal. You'll get a letter and a call about your pap smear.   - Try taking the lowest dose of crestor every other day. If you experience leg aching, start taking it every 3rd day, if symptoms continue for a week, start taking it every 4th day.  - Bring a log of your AM blood sugars please to your next visit.  Our clinic's number is [phone]. Feel free to call any time with questions or concerns. We will answer any questions after hours with our 24-hour emergency line at that number as well.   - Dr. Bonner Puna

## 2015-02-18 NOTE — Progress Notes (Signed)
   Subjective:   Alicia [name] is a 59 y.o. female with a history of insulin-dependent DM here for an annual gynecological exam.  She walks daily, drinks 1 alcoholic beverage every other day and denies illicit drug use. PHQ-2 neg x2.  - She reports average AM CBGs < 150 on lantus 12 units qHS. No feelings of hypoglycemia and no excessively low or high readings.   Wt Readings from Last 3 Encounters:  02/18/15 169 lb 9.6 oz (76.93 kg)  01/21/15 173 lb 3.2 oz (78.563 kg)  01/14/15 174 lb (78.926 kg)   - Non smoker Last menstrual period:   years ago Bleeding: no Sexually active: no Patient does not desire STD screening Vaginal discharge: none Dysuria:No   FH of breast, uterine, ovarian, colon cancer: No Breast mass or concerns: No Last pap smear: 10/17/2011, normal History of abnormal pap smear: No   - Review of Systems: Per HPI. All other systems reviewed and are negative. - Past medical, family and social histories as well as medications were reviewed and updated.  Objective:  BP 127/61 mmHg  Pulse 72  Temp(Src) 97.8 F (36.6 C) (Oral)  Ht 5\' 4"  (1.626 m)  Wt 169 lb 9.6 oz (76.93 kg)  BMI 29.10 kg/m2 Gen:  59 y.o. female in NAD HEENT: MMM, EOMI, PERRL, anicteric sclerae, nares patent, oropharynx clear, no thyromegaly CV: RRR, no MRG, no JVD, 2+ radial and DP pulses bilaterally Resp: Non-labored breathing ambient air, CTAB, no wheezes noted Breasts: Appear normal, no suspicious masses, no skin or nipple changes or axillary nodes on palpation. Abd: Soft, NTND, BS present, no guarding or organomegaly Neuro: Alert and oriented, speech normal Pelvic: External genitalia within normal limits. Vaginal mucosa pink, moist, normal rugae. Nonfriable cervix without lesions, no discharge or bleeding noted on speculum exam. No cervical motion tenderness.  April Zimmerman Rumple, CMA present throughout duration of exam.  Assessment/Plan:  Alicia [name] is a 59 y.o. female here for  annual gynecologic exam.   Health maintenance:  - STD screening: Declines - Pap smear: Done - Mammogram: 10/29/2014: BIRADS 1 - Vaccinations: UTD - Lipid screening: Today - DEXA scan not indicated - Follow up in 2 months for DM  See problem list for problem-specific plans.

## 2015-02-19 ENCOUNTER — Encounter: Payer: Self-pay | Admitting: Family Medicine

## 2015-02-19 LAB — CYTOLOGY - PAP

## 2015-02-19 LAB — HIV ANTIBODY (ROUTINE TESTING W REFLEX): HIV 1&2 Ab, 4th Generation: NONREACTIVE

## 2015-02-22 NOTE — Telephone Encounter (Signed)
Patient called regarding MRI results.

## 2015-02-22 NOTE — Telephone Encounter (Signed)
Spoke to pt via sign language interpreter. Advised her that MRI was unchanged since last MRI. Pt verbalized understanding and to call with further questions or concerns.

## 2015-02-22 NOTE — Telephone Encounter (Signed)
Returned pt's call. No answer, left a message on home phone via interpreter service to call me back.

## 2015-02-23 ENCOUNTER — Encounter: Payer: Self-pay | Admitting: *Deleted

## 2015-02-23 ENCOUNTER — Telehealth: Payer: Self-pay | Admitting: Family Medicine

## 2015-02-23 DIAGNOSIS — M6530 Trigger finger, unspecified finger: Secondary | ICD-10-CM

## 2015-02-23 DIAGNOSIS — M65312 Trigger thumb, left thumb: Secondary | ICD-10-CM

## 2015-02-23 DIAGNOSIS — M1812 Unilateral primary osteoarthritis of first carpometacarpal joint, left hand: Secondary | ICD-10-CM

## 2015-02-23 NOTE — Telephone Encounter (Signed)
Pt calling and needs a referral to [facility], phone: [phone] Alicia [name], ASA

## 2015-02-24 ENCOUNTER — Telehealth: Payer: Self-pay | Admitting: *Deleted

## 2015-02-24 NOTE — Telephone Encounter (Signed)
After talking with Dr Nori Riis, Dr Vanetta Shawl name was mention during last visit for the pt to go and see. Called [facility] to set up the appt with Dr Amedeo Plenty but they will have to see record from Dr. Nori Riis before they will schedule the pt. Will fax records to [phone] at their office and they will call pt with the appt time and date

## 2015-03-01 ENCOUNTER — Other Ambulatory Visit: Payer: Self-pay | Admitting: Family Medicine

## 2015-03-01 DIAGNOSIS — E118 Type 2 diabetes mellitus with unspecified complications: Secondary | ICD-10-CM

## 2015-03-01 NOTE — Addendum Note (Signed)
Addended by: Vance Gather B on: 03/01/2015 02:08 PM   Modules accepted: Miquel Dunn

## 2015-03-02 ENCOUNTER — Ambulatory Visit (INDEPENDENT_AMBULATORY_CARE_PROVIDER_SITE_OTHER): Payer: Medicare Other | Admitting: Family Medicine

## 2015-03-02 ENCOUNTER — Encounter: Payer: Self-pay | Admitting: Family Medicine

## 2015-03-02 VITALS — BP 170/70 | HR 74 | Temp 97.9°F | Ht 64.0 in | Wt 171.9 lb

## 2015-03-02 DIAGNOSIS — M79671 Pain in right foot: Secondary | ICD-10-CM

## 2015-03-02 NOTE — Patient Instructions (Signed)
Thanks for coming to see [name] today! I'm glad your foot is feeling better! Unfortunately it's hard to say what was causing your foot pain without seeing it. If this happens again and does not resolve, please come to the clinic so it can be evaluated. It was a pleasure meeting you!  Dr. Gerlean Ren

## 2015-03-02 NOTE — Progress Notes (Signed)
Subjective:     Patient ID: Alicia [name], female   DOB: [birth year], 59 y.o.   MRN: [id number]  HPI Alicia [name] is a 59yo female presenting today for right foot pain and swelling. - Pain started on Friday and lasted until yesterday - Pain was located over top of foot - Denies fever, redness, swelling - States pain was so bad she had to walk with weight on heel and could not ambulate normally - No history of prior episodes - Improved with soaking in mixture of Epsom salt, apple cider vinegar and water - Denies pain today - Able to ambulate at baseline  Review of Systems  Per HPI     Objective:   Physical Exam  Constitutional: She appears well-developed and well-nourished. No distress.  Cardiovascular: Normal rate and regular rhythm.  Exam reveals no gallop and no friction rub.   No murmur heard. Pulmonary/Chest: Effort normal. No respiratory distress. She has no wheezes.  Abdominal: Soft. Bowel sounds are normal. She exhibits no distension. There is no tenderness.  Musculoskeletal:  Hip ROM normal. Knee ROM normal with some crepitus bilaterally. Ankle ROM normal. No pain with palpation of foot or ankle. Able to ambulate normally without pain.       Assessment and Plan:     Right foot pain - Resolved - No further management or tests indicated - Return if symptoms recur

## 2015-03-03 NOTE — Assessment & Plan Note (Signed)
-   Resolved - No further management or tests indicated - Return if symptoms recur

## 2015-03-12 DIAGNOSIS — M65341 Trigger finger, right ring finger: Secondary | ICD-10-CM | POA: Diagnosis not present

## 2015-03-12 DIAGNOSIS — M1812 Unilateral primary osteoarthritis of first carpometacarpal joint, left hand: Secondary | ICD-10-CM | POA: Diagnosis not present

## 2015-03-12 DIAGNOSIS — G5601 Carpal tunnel syndrome, right upper limb: Secondary | ICD-10-CM | POA: Diagnosis not present

## 2015-03-12 DIAGNOSIS — M1811 Unilateral primary osteoarthritis of first carpometacarpal joint, right hand: Secondary | ICD-10-CM | POA: Diagnosis not present

## 2015-03-12 DIAGNOSIS — M65342 Trigger finger, left ring finger: Secondary | ICD-10-CM | POA: Diagnosis not present

## 2015-03-12 DIAGNOSIS — M65331 Trigger finger, right middle finger: Secondary | ICD-10-CM | POA: Diagnosis not present

## 2015-03-12 DIAGNOSIS — G5602 Carpal tunnel syndrome, left upper limb: Secondary | ICD-10-CM | POA: Diagnosis not present

## 2015-03-17 ENCOUNTER — Other Ambulatory Visit: Payer: Self-pay | Admitting: Family Medicine

## 2015-03-23 ENCOUNTER — Encounter: Payer: Self-pay | Admitting: Family Medicine

## 2015-03-23 ENCOUNTER — Ambulatory Visit (INDEPENDENT_AMBULATORY_CARE_PROVIDER_SITE_OTHER): Payer: Medicare Other | Admitting: Family Medicine

## 2015-03-23 VITALS — BP 112/77 | HR 86 | Temp 97.9°F | Wt 173.0 lb

## 2015-03-23 DIAGNOSIS — M545 Low back pain: Secondary | ICD-10-CM | POA: Diagnosis not present

## 2015-03-23 DIAGNOSIS — Z23 Encounter for immunization: Secondary | ICD-10-CM | POA: Diagnosis not present

## 2015-03-23 DIAGNOSIS — M79622 Pain in left upper arm: Secondary | ICD-10-CM

## 2015-03-23 DIAGNOSIS — M549 Dorsalgia, unspecified: Secondary | ICD-10-CM

## 2015-03-23 MED ORDER — KETOROLAC TROMETHAMINE 30 MG/ML IJ SOLN
30.0000 mg | Freq: Once | INTRAMUSCULAR | Status: AC
  Administered 2015-03-23: 30 mg via INTRAMUSCULAR

## 2015-03-23 MED ORDER — HYDROCODONE-ACETAMINOPHEN 5-325 MG PO TABS: 1.0000 | ORAL_TABLET | Freq: Four times a day (QID) | ORAL | Status: DC | PRN

## 2015-03-23 MED ORDER — CYCLOBENZAPRINE HCL 5 MG PO TABS: 5.0000 mg | ORAL_TABLET | Freq: Three times a day (TID) | ORAL | Status: DC | PRN

## 2015-03-23 NOTE — Patient Instructions (Signed)
Back Pain, Adult °Back pain is very common. The pain often gets better over time. The cause of back pain is usually not dangerous. Most people can learn to manage their back pain on their own.  °HOME CARE  °· Stay active. Start with short walks on flat ground if you can. Try to walk farther each day. °· Do not sit, drive, or stand in one place for more than 30 minutes. Do not stay in bed. °· Do not avoid exercise or work. Activity can help your back heal faster. °· Be careful when you bend or lift an object. Bend at your knees, keep the object close to you, and do not twist. °· Sleep on a firm mattress. Lie on your side, and bend your knees. If you lie on your back, put a pillow under your knees. °· Only take medicines as told by your doctor. °· Put ice on the injured area. °¨ Put ice in a plastic bag. °¨ Place a towel between your skin and the bag. °¨ Leave the ice on for 15-20 minutes, 03-04 times a day for the first 2 to 3 days. After that, you can switch between ice and heat packs. °· Ask your doctor about back exercises or massage. °· Avoid feeling anxious or stressed. Find good ways to deal with stress, such as exercise. °GET HELP RIGHT AWAY IF:  °· Your pain does not go away with rest or medicine. °· Your pain does not go away in 1 week. °· You have new problems. °· You do not feel well. °· The pain spreads into your legs. °· You cannot control when you poop (bowel movement) or pee (urinate). °· Your arms or legs feel weak or lose feeling (numbness). °· You feel sick to your stomach (nauseous) or throw up (vomit). °· You have belly (abdominal) pain. °· You feel like you may pass out (faint). °MAKE SURE YOU:  °· Understand these instructions. °· Will watch your condition. °· Will get help right away if you are not doing well or get worse. °Document Released: 12/06/2007 Document Revised: 09/11/2011 Document Reviewed: 10/21/2013 °ExitCare® Patient Information ©2015 [employer]. This information is not intended  to replace advice given to you by your health care provider. Make sure you discuss any questions you have with your health care provider. ° °

## 2015-03-23 NOTE — Progress Notes (Signed)
Patient ID: Alicia [name], female   DOB: 03/03/1956, 59 y.o.   MRN: [id number]  HPI:  This history was provided by the patient with the assistance of a ASL interpreter.  Patient presents with low back pain that began yesterday morning upon waking.  Patient states pain began on left side and radiates to right low back.  Patient states pain also radiates into left hip.  Patient states pain increases with flexion, extension and twisting movement of low back.  Patient has taken Aleve and Diclofenac to treat with no relief.  Patient denies numbness and tingling in low back and legs.  Patient denies trauma or recent heavy lifting.  Patient states she did help a friend clean her house this past weekend before the pain began on Monday, but denies doing anything unusual for her.  Patient states she had an episode of low back pain many years ago, but it was not as severe as the current episode. Patient cannot recall seeking treatment for that episode.  Patient also states she has a referral to see an orthopedist about recurrent left arm and shoulder pain.  Patient states she had a CT scan 4-5 months ago for bilateral quadricep pain, but is unaware of a diagnosis related to that study.  Patient denies hematuria, frequency, urgency and dysuria.    ROS: See HPI.  Fraser: Osteoarthritis, myalgia  PHYSICAL EXAM: BP 112/77 mmHg  Pulse 86  Temp(Src) 97.9 F (36.6 C) (Oral)  Wt 173 lb (78.472 kg) Gen: Patient is a well appearing female who is in no distress. HEENT: Normocephalic.  No drainage from eyes, conjunctiva pink and moist, sclera white, no rhinorrhea or otorrhea noted. Heart: S1/S2, no rub, gallop or murmur. Lungs: Clear to auscultation in all lobes, no wheezing or crackles noted. Neuro: PERRL 3 mm, EOMI.  Alert and oriented.  Lower and upper extremity touch sensation intact.  Strength in upper and lower extremities equal bilaterally.  Unable to assess reflexes due to patient discomfort during exam.  Patient  endorses mild pain with palpation of low back.  Patient can flex and extend at waist, but not without increased pain to low back. Ext: Moves all extremities.  No crepitus noted.  ASSESSMENT/PLAN:  Health maintenance: Flu vaccine given today.     No problem-specific assessment & plan notes found for this encounter.  FOLLOW UP: F/u with PCP in 1 week.  Patient instructed to go to ED if she experiences incontinence of urine or stool or begins to experience numbness/tingling in low back or legs.  Patient has an appointment with an orthopedist to assess left arm/shoulder pain as well as an appointment with a neurologist to assess tingling/pain in left arm.  Patient is encouraged to keep those appointments.  Sherron Ales, NP Student [facility]   [facility] ATTENDING NOTE Alicia [name]  have seen and examined this patient, reviewed their chart. [name] have discussed this patient with the NP student. [name] agree with the  findings, assessment and care plan.  Briefly 59 Y/O F presented with low back pain that started yesterday suddenly, she denies any fall, no trauma to her back. She had not been lifting heavy object. She woke up with the pain yesterday. A day before she helped some friend do house cleaning. Pain is more than 10/10 in severity, aching and at times sharp in nature. She used Aleeve and Diclofenac at home with no improvement. No GI or GU symptoms.  Review of Systems  Constitutional: Negative for  fever.  Respiratory: Negative.   Cardiovascular: Negative.   Musculoskeletal: Positive for back pain.  All other systems reviewed and are negative. Medical, family hx and past medical hx reviewed.  Filed Vitals:   03/23/15 0920  BP: 112/77  Pulse: 86  Temp: 97.9 F (36.6 C)  TempSrc: Oral  Weight: 173 lb (78.472 kg)    Exam: Gen: In moderate distress due to pain. Resp: air entry equal and CTA B/L Heart: S1 S2 normal, no murmur. Abd: benign. MSK: Limited ROM of lumbar  spine. ++ tenderness with ++ muscle spasm of the paraspinal area.  Neuro: No sensory loss. Gait slow but steady. Motor 5/5 globally. CN intact.  A/P: 59 Y/O F wit acute back pain:         Likely lumbar disc herniation vs spinal stenosis vs OA.         Toradol 30 mg IM given during this visit.          Pain level reassessed 15 min later improved from 10/10 to 6/10 in severity.          Vicodin refilled for few days as well as flexeril was prescribed.          Patient to rest at home for two days and then start regular activity gradually.          Return precaution discussed.  More than 30 min was spent for this face to face visit and coordination of care.

## 2015-03-24 DIAGNOSIS — G5601 Carpal tunnel syndrome, right upper limb: Secondary | ICD-10-CM | POA: Diagnosis not present

## 2015-03-24 DIAGNOSIS — G5602 Carpal tunnel syndrome, left upper limb: Secondary | ICD-10-CM | POA: Diagnosis not present

## 2015-03-24 DIAGNOSIS — M65341 Trigger finger, right ring finger: Secondary | ICD-10-CM | POA: Diagnosis not present

## 2015-03-24 DIAGNOSIS — M65332 Trigger finger, left middle finger: Secondary | ICD-10-CM | POA: Diagnosis not present

## 2015-03-29 ENCOUNTER — Other Ambulatory Visit: Payer: Self-pay | Admitting: Orthopedic Surgery

## 2015-03-31 ENCOUNTER — Ambulatory Visit: Payer: Medicare Other | Admitting: Family Medicine

## 2015-04-14 ENCOUNTER — Encounter (HOSPITAL_BASED_OUTPATIENT_CLINIC_OR_DEPARTMENT_OTHER): Payer: Self-pay | Admitting: *Deleted

## 2015-04-14 NOTE — Progress Notes (Signed)
Called interpretive services for hearing impaired and scheduled a sign interpreter for surgery. Also pt will come in for BMET and EKG before surgery.

## 2015-04-19 ENCOUNTER — Other Ambulatory Visit: Payer: Self-pay

## 2015-04-19 ENCOUNTER — Encounter (HOSPITAL_BASED_OUTPATIENT_CLINIC_OR_DEPARTMENT_OTHER)
Admission: RE | Admit: 2015-04-19 | Discharge: 2015-04-19 | Disposition: A | Discharge status: Home / Self Care | Payer: Medicare Other | Source: Ambulatory Visit | Attending: Orthopedic Surgery | Admitting: Orthopedic Surgery

## 2015-04-19 DIAGNOSIS — M65331 Trigger finger, right middle finger: Secondary | ICD-10-CM | POA: Diagnosis not present

## 2015-04-19 DIAGNOSIS — E1140 Type 2 diabetes mellitus with diabetic neuropathy, unspecified: Secondary | ICD-10-CM | POA: Diagnosis not present

## 2015-04-19 DIAGNOSIS — M65341 Trigger finger, right ring finger: Secondary | ICD-10-CM | POA: Diagnosis not present

## 2015-04-19 DIAGNOSIS — I10 Essential (primary) hypertension: Secondary | ICD-10-CM | POA: Diagnosis not present

## 2015-04-19 DIAGNOSIS — E1143 Type 2 diabetes mellitus with diabetic autonomic (poly)neuropathy: Secondary | ICD-10-CM | POA: Diagnosis not present

## 2015-04-19 DIAGNOSIS — E785 Hyperlipidemia, unspecified: Secondary | ICD-10-CM | POA: Diagnosis not present

## 2015-04-19 DIAGNOSIS — G5601 Carpal tunnel syndrome, right upper limb: Secondary | ICD-10-CM | POA: Diagnosis not present

## 2015-04-19 DIAGNOSIS — H9190 Unspecified hearing loss, unspecified ear: Secondary | ICD-10-CM | POA: Diagnosis not present

## 2015-04-19 DIAGNOSIS — K3184 Gastroparesis: Secondary | ICD-10-CM | POA: Diagnosis not present

## 2015-04-19 DIAGNOSIS — M19042 Primary osteoarthritis, left hand: Secondary | ICD-10-CM | POA: Diagnosis not present

## 2015-04-19 DIAGNOSIS — Z8661 Personal history of infections of the central nervous system: Secondary | ICD-10-CM | POA: Diagnosis not present

## 2015-04-19 DIAGNOSIS — R569 Unspecified convulsions: Secondary | ICD-10-CM | POA: Diagnosis not present

## 2015-04-19 DIAGNOSIS — G5621 Lesion of ulnar nerve, right upper limb: Secondary | ICD-10-CM | POA: Diagnosis not present

## 2015-04-19 DIAGNOSIS — K219 Gastro-esophageal reflux disease without esophagitis: Secondary | ICD-10-CM | POA: Diagnosis not present

## 2015-04-19 DIAGNOSIS — M19041 Primary osteoarthritis, right hand: Secondary | ICD-10-CM | POA: Diagnosis not present

## 2015-04-19 DIAGNOSIS — Z882 Allergy status to sulfonamides status: Secondary | ICD-10-CM | POA: Diagnosis not present

## 2015-04-19 LAB — BASIC METABOLIC PANEL
Anion gap: 8 (ref 5–15)
BUN: 12 mg/dL (ref 6–20)
CO2: 28 mmol/L (ref 22–32)
Calcium: 10.1 mg/dL (ref 8.9–10.3)
Chloride: 104 mmol/L (ref 101–111)
Creatinine, Ser: 0.54 mg/dL (ref 0.44–1.00)
GFR calc Af Amer: >60 mL/min (ref >60)
GFR calc non Af Amer: >60 mL/min (ref >60)
Glucose, Bld: 206 mg/dL — ABNORMAL HIGH (ref 65–99)
Potassium: 3.9 mmol/L (ref 3.5–5.1)
Sodium: 140 mmol/L (ref 135–145)

## 2015-04-20 ENCOUNTER — Ambulatory Visit (HOSPITAL_BASED_OUTPATIENT_CLINIC_OR_DEPARTMENT_OTHER)
Admission: RE | Admit: 2015-04-20 | Discharge: 2015-04-20 | Disposition: A | Discharge status: Home / Self Care | Payer: Medicare Other | Source: Ambulatory Visit | Attending: Orthopedic Surgery | Admitting: Orthopedic Surgery

## 2015-04-20 ENCOUNTER — Encounter (HOSPITAL_BASED_OUTPATIENT_CLINIC_OR_DEPARTMENT_OTHER): Payer: Self-pay | Admitting: Orthopedic Surgery

## 2015-04-20 ENCOUNTER — Encounter (HOSPITAL_BASED_OUTPATIENT_CLINIC_OR_DEPARTMENT_OTHER)
Admission: RE | Disposition: A | Discharge status: Home / Self Care | Payer: Self-pay | Source: Ambulatory Visit | Attending: Orthopedic Surgery

## 2015-04-20 ENCOUNTER — Ambulatory Visit (HOSPITAL_BASED_OUTPATIENT_CLINIC_OR_DEPARTMENT_OTHER): Payer: Medicare Other | Admitting: Certified Registered"

## 2015-04-20 DIAGNOSIS — G5621 Lesion of ulnar nerve, right upper limb: Secondary | ICD-10-CM | POA: Insufficient documentation

## 2015-04-20 DIAGNOSIS — M19042 Primary osteoarthritis, left hand: Secondary | ICD-10-CM | POA: Insufficient documentation

## 2015-04-20 DIAGNOSIS — G5601 Carpal tunnel syndrome, right upper limb: Secondary | ICD-10-CM | POA: Insufficient documentation

## 2015-04-20 DIAGNOSIS — H9190 Unspecified hearing loss, unspecified ear: Secondary | ICD-10-CM | POA: Insufficient documentation

## 2015-04-20 DIAGNOSIS — M65341 Trigger finger, right ring finger: Secondary | ICD-10-CM | POA: Diagnosis not present

## 2015-04-20 DIAGNOSIS — E785 Hyperlipidemia, unspecified: Secondary | ICD-10-CM | POA: Diagnosis not present

## 2015-04-20 DIAGNOSIS — M65331 Trigger finger, right middle finger: Secondary | ICD-10-CM | POA: Insufficient documentation

## 2015-04-20 DIAGNOSIS — I10 Essential (primary) hypertension: Secondary | ICD-10-CM | POA: Insufficient documentation

## 2015-04-20 DIAGNOSIS — M79641 Pain in right hand: Secondary | ICD-10-CM | POA: Diagnosis not present

## 2015-04-20 DIAGNOSIS — K219 Gastro-esophageal reflux disease without esophagitis: Secondary | ICD-10-CM | POA: Insufficient documentation

## 2015-04-20 DIAGNOSIS — M19041 Primary osteoarthritis, right hand: Secondary | ICD-10-CM | POA: Insufficient documentation

## 2015-04-20 DIAGNOSIS — E1143 Type 2 diabetes mellitus with diabetic autonomic (poly)neuropathy: Secondary | ICD-10-CM | POA: Insufficient documentation

## 2015-04-20 DIAGNOSIS — Z8661 Personal history of infections of the central nervous system: Secondary | ICD-10-CM | POA: Insufficient documentation

## 2015-04-20 DIAGNOSIS — R569 Unspecified convulsions: Secondary | ICD-10-CM | POA: Insufficient documentation

## 2015-04-20 DIAGNOSIS — K3184 Gastroparesis: Secondary | ICD-10-CM | POA: Insufficient documentation

## 2015-04-20 DIAGNOSIS — Z882 Allergy status to sulfonamides status: Secondary | ICD-10-CM | POA: Insufficient documentation

## 2015-04-20 DIAGNOSIS — E1140 Type 2 diabetes mellitus with diabetic neuropathy, unspecified: Secondary | ICD-10-CM | POA: Insufficient documentation

## 2015-04-20 DIAGNOSIS — G8918 Other acute postprocedural pain: Secondary | ICD-10-CM | POA: Diagnosis not present

## 2015-04-20 HISTORY — DX: Myoneural disorder, unspecified: G70.9

## 2015-04-20 HISTORY — DX: Anemia, unspecified: D64.9

## 2015-04-20 HISTORY — PX: TRIGGER FINGER RELEASE: SHX641

## 2015-04-20 HISTORY — PX: CARPAL TUNNEL RELEASE: SHX101

## 2015-04-20 HISTORY — PX: ULNAR NERVE TRANSPOSITION: SHX2595

## 2015-04-20 LAB — GLUCOSE, CAPILLARY
Glucose-Capillary: 81 mg/dL (ref 65–99)
Glucose-Capillary: 75 mg/dL (ref 65–99)

## 2015-04-20 SURGERY — CARPAL TUNNEL RELEASE
Anesthesia: Monitor Anesthesia Care | Site: Hand | Laterality: Right

## 2015-04-20 MED ORDER — FENTANYL CITRATE (PF) 100 MCG/2ML IJ SOLN
50.0000 ug | INTRAMUSCULAR | Status: DC | PRN
  Administered 2015-04-20: 100 ug via INTRAVENOUS
  Administered 2015-04-20: 50 ug via INTRAVENOUS

## 2015-04-20 MED ORDER — MIDAZOLAM HCL 2 MG/2ML IJ SOLN
1.0000 mg | INTRAMUSCULAR | Status: DC | PRN
  Administered 2015-04-20: 2 mg via INTRAVENOUS

## 2015-04-20 MED ORDER — GLYCOPYRROLATE 0.2 MG/ML IJ SOLN: 0.2000 mg | Freq: Once | INTRAMUSCULAR | Status: DC | PRN

## 2015-04-20 MED ORDER — SCOPOLAMINE 1 MG/3DAYS TD PT72: 1.0000 | MEDICATED_PATCH | Freq: Once | TRANSDERMAL | Status: DC | PRN

## 2015-04-20 MED ORDER — ONDANSETRON HCL 4 MG/2ML IJ SOLN
INTRAMUSCULAR | Status: DC | PRN
  Administered 2015-04-20: 4 mg via INTRAVENOUS

## 2015-04-20 MED ORDER — OXYCODONE-ACETAMINOPHEN 10-325 MG PO TABS: 1.0000 | ORAL_TABLET | ORAL | Status: DC | PRN

## 2015-04-20 MED ORDER — FENTANYL CITRATE (PF) 100 MCG/2ML IJ SOLN
INTRAMUSCULAR | Status: AC
  Filled 2015-04-20: qty 2

## 2015-04-20 MED ORDER — DEXAMETHASONE SODIUM PHOSPHATE 10 MG/ML IJ SOLN
INTRAMUSCULAR | Status: AC
  Filled 2015-04-20: qty 1

## 2015-04-20 MED ORDER — CHLORHEXIDINE GLUCONATE 4 % EX LIQD: 60.0000 mL | Freq: Once | CUTANEOUS | Status: DC

## 2015-04-20 MED ORDER — HYDROMORPHONE HCL 1 MG/ML IJ SOLN: 0.2500 mg | INTRAMUSCULAR | Status: DC | PRN

## 2015-04-20 MED ORDER — ONDANSETRON HCL 4 MG/2ML IJ SOLN
INTRAMUSCULAR | Status: AC
  Filled 2015-04-20: qty 2

## 2015-04-20 MED ORDER — LACTATED RINGERS IV SOLN
INTRAVENOUS | Status: DC
  Administered 2015-04-20 (×2): via INTRAVENOUS

## 2015-04-20 MED ORDER — LIDOCAINE HCL (CARDIAC) 20 MG/ML IV SOLN
INTRAVENOUS | Status: AC
  Filled 2015-04-20: qty 5

## 2015-04-20 MED ORDER — PROPOFOL 500 MG/50ML IV EMUL
INTRAVENOUS | Status: DC | PRN
  Administered 2015-04-20: 100 ug/kg/min via INTRAVENOUS

## 2015-04-20 MED ORDER — MEPERIDINE HCL 25 MG/ML IJ SOLN: 6.2500 mg | INTRAMUSCULAR | Status: DC | PRN

## 2015-04-20 MED ORDER — MIDAZOLAM HCL 2 MG/2ML IJ SOLN
INTRAMUSCULAR | Status: AC
  Filled 2015-04-20: qty 2

## 2015-04-20 MED ORDER — FENTANYL CITRATE (PF) 100 MCG/2ML IJ SOLN: 100.0000 ug | Freq: Once | INTRAMUSCULAR | Status: DC

## 2015-04-20 MED ORDER — PROPOFOL 10 MG/ML IV BOLUS
INTRAVENOUS | Status: AC
  Filled 2015-04-20: qty 20

## 2015-04-20 MED ORDER — CEFAZOLIN SODIUM-DEXTROSE 2-3 GM-% IV SOLR: 2.0000 g | INTRAVENOUS | Status: DC

## 2015-04-20 MED ORDER — OXYCODONE HCL 5 MG PO TABS: 5.0000 mg | ORAL_TABLET | Freq: Once | ORAL | Status: DC | PRN

## 2015-04-20 MED ORDER — CEFAZOLIN SODIUM-DEXTROSE 2-3 GM-% IV SOLR
INTRAVENOUS | Status: AC
  Filled 2015-04-20: qty 50

## 2015-04-20 MED ORDER — PROPOFOL 10 MG/ML IV BOLUS
INTRAVENOUS | Status: DC | PRN
  Administered 2015-04-20: 200 mg via INTRAVENOUS

## 2015-04-20 MED ORDER — LIDOCAINE HCL (CARDIAC) 20 MG/ML IV SOLN
INTRAVENOUS | Status: DC | PRN
Start: 2015-04-20 — End: 2015-04-20
  Administered 2015-04-20 (×2): 50 mg via INTRAVENOUS

## 2015-04-20 MED ORDER — OXYCODONE HCL 5 MG/5ML PO SOLN: 5.0000 mg | Freq: Once | ORAL | Status: DC | PRN

## 2015-04-20 MED ORDER — CEFAZOLIN SODIUM-DEXTROSE 2-3 GM-% IV SOLR
2.0000 g | INTRAVENOUS | Status: AC
  Administered 2015-04-20: 2 g via INTRAVENOUS

## 2015-04-20 MED ORDER — PROMETHAZINE HCL 25 MG/ML IJ SOLN: 6.2500 mg | INTRAMUSCULAR | Status: DC | PRN

## 2015-04-20 MED ORDER — BUPIVACAINE-EPINEPHRINE (PF) 0.5% -1:200000 IJ SOLN
INTRAMUSCULAR | Status: DC | PRN
  Administered 2015-04-20: 30 mL via PERINEURAL

## 2015-04-20 SURGICAL SUPPLY — 52 items
BANDAGE COBAN STERILE 2 (GAUZE/BANDAGES/DRESSINGS) ×2 IMPLANT
BLADE MINI RND TIP GREEN BEAV (BLADE) ×2 IMPLANT
BLADE SURG 15 STRL LF DISP TIS (BLADE) ×2 IMPLANT
BLADE SURG 15 STRL SS (BLADE) ×4
BNDG CMPR 9X4 STRL LF SNTH (GAUZE/BANDAGES/DRESSINGS) ×2
BNDG COHESIVE 3X5 TAN STRL LF (GAUZE/BANDAGES/DRESSINGS) ×8 IMPLANT
BNDG ESMARK 4X9 LF (GAUZE/BANDAGES/DRESSINGS) ×4 IMPLANT
BNDG GAUZE ELAST 4 BULKY (GAUZE/BANDAGES/DRESSINGS) ×4 IMPLANT
CHLORAPREP W/TINT 26ML (MISCELLANEOUS) ×4 IMPLANT
CORDS BIPOLAR (ELECTRODE) ×4 IMPLANT
COVER BACK TABLE 60X90IN (DRAPES) ×4 IMPLANT
COVER MAYO STAND STRL (DRAPES) ×4 IMPLANT
CUFF TOURN SGL LL 18 NRW (TOURNIQUET CUFF) ×2 IMPLANT
CUFF TOURNIQUET SINGLE 18IN (TOURNIQUET CUFF) ×4 IMPLANT
DECANTER SPIKE VIAL GLASS SM (MISCELLANEOUS) IMPLANT
DRAPE EXTREMITY T 121X128X90 (DRAPE) ×4 IMPLANT
DRAPE SURG 17X23 STRL (DRAPES) ×4 IMPLANT
DRSG PAD ABDOMINAL 8X10 ST (GAUZE/BANDAGES/DRESSINGS) ×6 IMPLANT
GAUZE SPONGE 4X4 12PLY STRL (GAUZE/BANDAGES/DRESSINGS) ×4 IMPLANT
GAUZE SPONGE 4X4 16PLY XRAY LF (GAUZE/BANDAGES/DRESSINGS) IMPLANT
GAUZE XEROFORM 1X8 LF (GAUZE/BANDAGES/DRESSINGS) ×4 IMPLANT
GLOVE BIO SURGEON STRL SZ 6.5 (GLOVE) ×2 IMPLANT
GLOVE BIO SURGEONS STRL SZ 6.5 (GLOVE) ×2
GLOVE BIOGEL PI IND STRL 7.0 (GLOVE) IMPLANT
GLOVE BIOGEL PI IND STRL 8.5 (GLOVE) ×2 IMPLANT
GLOVE BIOGEL PI INDICATOR 7.0 (GLOVE) ×4
GLOVE BIOGEL PI INDICATOR 8.5 (GLOVE) ×2
GLOVE SURG ORTHO 8.0 STRL STRW (GLOVE) ×4 IMPLANT
GOWN STRL REUS W/ TWL LRG LVL3 (GOWN DISPOSABLE) ×2 IMPLANT
GOWN STRL REUS W/TWL LRG LVL3 (GOWN DISPOSABLE) ×8
GOWN STRL REUS W/TWL XL LVL3 (GOWN DISPOSABLE) ×4 IMPLANT
LOOP VESSEL MAXI BLUE (MISCELLANEOUS) IMPLANT
NDL PRECISIONGLIDE 27X1.5 (NEEDLE) ×2 IMPLANT
NEEDLE PRECISIONGLIDE 27X1.5 (NEEDLE) ×4 IMPLANT
NS IRRIG 1000ML POUR BTL (IV SOLUTION) ×4 IMPLANT
PACK BASIN DAY SURGERY FS (CUSTOM PROCEDURE TRAY) ×4 IMPLANT
PAD CAST 3X4 CTTN HI CHSV (CAST SUPPLIES) IMPLANT
PAD CAST 4YDX4 CTTN HI CHSV (CAST SUPPLIES) ×2 IMPLANT
PADDING CAST COTTON 3X4 STRL (CAST SUPPLIES)
PADDING CAST COTTON 4X4 STRL (CAST SUPPLIES) ×4
SLEEVE SCD COMPRESS KNEE MED (MISCELLANEOUS) ×4 IMPLANT
SPLINT PLASTER CAST XFAST 3X15 (CAST SUPPLIES) IMPLANT
SPLINT PLASTER XTRA FASTSET 3X (CAST SUPPLIES)
STOCKINETTE 4X48 STRL (DRAPES) ×4 IMPLANT
SUT ETHILON 4 0 PS 2 18 (SUTURE) ×8 IMPLANT
SUT VIC AB 2-0 SH 27 (SUTURE) ×4
SUT VIC AB 2-0 SH 27XBRD (SUTURE) ×2 IMPLANT
SUT VICRYL 4-0 PS2 18IN ABS (SUTURE) ×4 IMPLANT
SYR BULB 3OZ (MISCELLANEOUS) ×4 IMPLANT
SYR CONTROL 10ML LL (SYRINGE) ×4 IMPLANT
TOWEL OR 17X24 6PK STRL BLUE (TOWEL DISPOSABLE) ×8 IMPLANT
UNDERPAD 30X30 (UNDERPADS AND DIAPERS) ×4 IMPLANT

## 2015-04-20 NOTE — Transfer of Care (Signed)
Immediate Anesthesia Transfer of Care Note  Patient: Alicia [name]  Procedure(s) Performed: Procedure(s): RIGHT CARPAL TUNNEL RELEASE (Right) RIGHT ULNAR NERVE DECOMPRESSION (Right) RELEASE TRIGGER FINGER/A-1 PULLEY RIGHT MIDDLE FINGER,RIGHT RING FINGER (Right)  Patient Location: PACU  Anesthesia Type:General  Level of Consciousness: sedated  Airway & Oxygen Therapy: Patient Spontanous Breathing and Patient connected to face mask oxygen  Post-op Assessment: Report given to RN and Post -op Vital signs reviewed and stable  Post vital signs: Reviewed and stable  Last Vitals:  Filed Vitals:   04/20/15 1121  BP: 173/72  Pulse: 80  Temp:   Resp: 17    Complications: No apparent anesthesia complications

## 2015-04-20 NOTE — Anesthesia Postprocedure Evaluation (Signed)
Anesthesia Post Note  Patient: Alicia [name]  Procedure(s) Performed: Procedure(s) (LRB): RIGHT CARPAL TUNNEL RELEASE (Right) RIGHT ULNAR NERVE DECOMPRESSION (Right) RELEASE TRIGGER FINGER/A-1 PULLEY RIGHT MIDDLE FINGER,RIGHT RING FINGER (Right)  Anesthesia type: MAC + ax block  Patient location: PACU  Post pain: Pain level controlled  Post assessment: Post-op Vital signs reviewed  Last Vitals: BP 155/79 mmHg  Pulse 78  Temp(Src) 36.5 C (Oral)  Resp 20  Ht 5\' 4"  (1.626 m)  Wt 177 lb (80.287 kg)  BMI 30.37 kg/m2  SpO2 97%  Post vital signs: Reviewed  Level of consciousness: awake  Complications: No apparent anesthesia complications

## 2015-04-20 NOTE — Op Note (Signed)
Alicia [name], DRUCK               ACCOUNT NO.:  [id number]  MEDICAL RECORD NO.:  [id number]  LOCATION:                                 FACILITY:  PHYSICIAN:  Daryll Brod, M.D.       DATE OF BIRTH:  05-07-1956  DATE OF PROCEDURE:  04/20/2015 DATE OF DISCHARGE:                              OPERATIVE REPORT   PREOPERATIVE DIAGNOSIS:  Carpal tunnel syndrome, cubital tunnel syndrome, stenosing tenosynovitis, right middle and right ring fingers.  POSTOPERATIVE DIAGNOSIS:  Carpal tunnel syndrome, cubital tunnel syndrome, stenosing tenosynovitis, right middle and right ring fingers.  OPERATION:  Release of carpal canal right hand, release of the A1 pulley of the right middle, release of A1 pulley of right ring, decompression of ulnar nerve of right elbow.  SURGEON:  Daryll Brod, MD.  ANESTHESIA:  Supraclavicular block general.  ANESTHESIOLOGIST:  Wynetta Emery.  HISTORY:  The patient is a 59 year old female with a history of carpal tunnel syndrome, numbness and tingling, cubital tunnel, and triggering of her middle and ring fingers of right hand.  Nerve conductions are positive.  This is not responded to conservative treatment.  She has elected to undergo surgical decompression of the median nerve, ulnar nerve at the elbow, median nerve at the wrist, release of A1 pulley of the middle and ring fingers.  Pre, peri, and postoperative courses have been discussed along with risks and complications.  She is aware that there is no guarantee with the surgery; possibility of infection; recurrence of injury to arteries, nerves, tendons; incomplete relief of symptoms; and dystrophy.  In the preoperative area, the patient is seen, the extremity marked by both patient and surgeon.  Antibiotic given.  PROCEDURE IN DETAIL:  The patient was brought to the operating room, where a supraclavicular block was carried out in the preoperative area. General anesthetic was carried out in the operating room and  that she still had feeling.  She was prepped using ChloraPrep in supine position with the right arm free.  A 3-minute dry time was allowed.  Time-out taken, confirming the patient and procedure.  The limb was exsanguinated with an Esmarch bandage.  Tourniquet placed high on the arm was inflated to 250 mmHg.  An oblique incision was made over the A1 pulley of the right middle finger, carried down through subcutaneous tissue. Neurovascular structures identified and protected.  The A1 pulley was released on its radial aspect.  A small incision made centrally in A2. Partial tenosynovectomy performed proximally with the 2 flexor tendons separating, finger placed through a full range motion, no further triggering was noted.  A separate incision was then made on the ring finger, A1 pulley area, carried down through subcutaneous tissue. Bleeders again electrocauterized.  Retractors placed protecting neurovascular bundles radially and ulnarly.  The A1 pulley was identified, this found to be markedly thickened, this was released on its radial aspect.  A small incision made centrally in A2.  Partial tenosynovectomy performed proximally with separation of the 2 tendons, again the finger placed through a full passive mobility, no further triggering was noted.  A longitudinal incision was then made in the right palm, carried down through subcutaneous tissue.  Bleeders  were electrocauterized.  Palmar fascia was split.  Superficial palmar arch identified.  Flexor tendon of the ring and little finger identified. Retractors placed protecting median nerve radially, ulnar nerve ulnarly. An incision was then made through the flexor retinaculum.  A right angle and Sewall retractors were placed between skin and forearm fascia.  The fascia released for approximately 2 cm proximal to the wrist crease under direct vision.  Canal was explored.  Air compression to the nerve was apparent.  Motor branch entered into  muscle.  No further lesions were noted.  Each of these wounds were irrigated and closed with interrupted 4-0 nylon sutures.  A separate incision was then made over the medial aspect of the right elbow just over the epicondyle, carried down through subcutaneous tissue.  The ulnar nerve was identified behind the epicondyle.  The Osborne fascia was released in its posterior aspect, this was then allowed be retracted anteriorly.  The forearm fascia was then dissected free from the overlying subcutaneous tissue and skin distally.  A knee retractor was placed, a second the retractor, a fasciotomy was then performed to the flexor carpi ulnaris.  The 2 heads of the flexor carpi ulnaris were then separated. A KMI guide for carpal tunnel release was then placed between the ulnar nerve using right angle ENT straight scissors.  The deep fascia was then released for approximately 7 cm distally.  Attention was then addressed proximally.  The brachial fascia was then separated from the overlying skin and subcutaneous tissue.  Again the knee retractors placed.  The [facility] guide placed between the ulnar nerve.  The fascia proximally was then released for approximately 7 cm proximally up to the ligament of Struthers.  The elbow placed through full flexion, no subluxation to the ulnar nerve was noted.  The wounds were copiously irrigated with saline. The anterior portion of Osborne fascia was then sutured to the posterior skin flap with figure-of-eight 2-0 Vicryl sutures.  The subcutaneous tissue closed with interrupted 4-0 Vicryl, and the skin with interrupted 4-0 nylon sutures.  A sterile compressive dressing to each wound was applied.  On deflation of the tourniquet, all fingers immediately pinked.  She was taken to the recovery room for observation in satisfactory condition.  She will be discharged home to return to the [facility] in 1 week, on Percocet.           ______________________________ Daryll Brod, M.D.     GK/MEDQ  D:  04/20/2015  T:  04/20/2015  Job:  [id number]

## 2015-04-20 NOTE — Anesthesia Procedure Notes (Addendum)
Procedure Name: LMA Insertion Date/Time: 04/20/2015 11:39 AM Performed by: Lieutenant Diego Pre-anesthesia Checklist: Patient identified, Emergency Drugs available, Suction available and Patient being monitored Patient Re-evaluated:Patient Re-evaluated prior to inductionOxygen Delivery Method: Circle System Utilized Preoxygenation: Pre-oxygenation with 100% oxygen Intubation Type: IV induction Ventilation: Mask ventilation without difficulty LMA: LMA inserted LMA Size: 4.0 Number of attempts: 1 Airway Equipment and Method: Bite block Placement Confirmation: positive ETCO2 and breath sounds checked- equal and bilateral Tube secured with: Tape Dental Injury: Teeth and Oropharynx as per pre-operative assessment    Anesthesia Regional Block:  Axillary brachial plexus block  Pre-Anesthetic Checklist: ,, timeout performed, Correct Patient, Correct Site, Correct Laterality, Correct Procedure, Correct Position, site marked, Risks and benefits discussed, Surgical consent,  Pre-op evaluation,  Post-op pain management  Laterality: Right  Prep: chloraprep       Needles:  Injection technique: Single-shot  Needle Type: Stimulator Needle - 40     Needle Length: 4cm 4 cm Needle Gauge: 22 and 22 G    Additional Needles:  Procedures: ultrasound guided (picture in chart) Axillary brachial plexus block Narrative:  Injection made incrementally with aspirations every 5 mL. Anesthesiologist: Nolon Nations  Additional Notes: BP cuff, EKG monitors applied. Sedation begun. Nerve location verified with U/S. Anesthetic injected incrementally, slowly , and after neg aspirations under direct u/s guidance. Good perineural spread. Tolerated well.

## 2015-04-20 NOTE — Discharge Instructions (Addendum)

## 2015-04-20 NOTE — H&P (Signed)
Alicia [name] is a 59 year old right hand dominant female complaining of pain in both hands. She has not had a diagnosis made. She is referred from Dr. Bonner Puna at [facility]. She complains of pain in her thumbs bilaterally. Her thumb, index and middle fingers bilaterally. She recalls no history of injury. She is diabetic. She has no history of thyroid problems. She does have increased BP. She is deaf. She does have a history of injury to her neck a year ago. She has had multiple injections to her thumb at the MCP joint, IP joint volar aspect. She has had injections to her right thumb MCP joint, IP joint and into the middle and ring fingers. She has a history of deQuervain's release and a dorsal ganglion excision, deQuervain's on the right and dorsal ganglion on the left. She is on Voltaren. She has constant, extremely severe aching pain with a feeling of weakness. Activity and work makes this worse. The injections have given her minimal relief. She has had her nerve conductions done revealing a severe carpal tunnel syndrome on her right side with motor delay of 7.9, sensory delay of 3.9. She also has changes in the ulnar nerve on the right elbow. Conduction velocity diminution to 44. Her amplitude is decreased on her right side to 6.3. She has carpal tunnel syndrome on the left side with motor delay of 5.7, sensory delay 3.3, amplitude diminution 50. She does have changes on both sides in her F-wave. She has triggering of her middle fingers bilaterally and left middle finger and CMC arthritis.   PAST MEDICAL HISTORY:  She is allergic to sulfa, Lipitor and Ramipril. She is on the following medications: fluticasone nasal spray, gabapentin, HCTZ, Hydrocodone, Hyoscyamine, ibuprofen, NovoLog, Lantus, losartan, metformin, metoclopramide, metoprolol, nortriptyline, oxycodone, Bactrim ointment, baby aspirin, calcium vitamin D, cyanocobalamin, diclofenac, Durezol, and ferrous sulfate. The only surgery available is  the ganglion cyst excision and the deQuervain's. No other surgeries are noted on history form or notes from Dr. Hadley Pen office. She says she has had an appendectomy.  FAMILY MEDICAL HISTORY: Positive for diabetes, high BP and arthritis.  SOCIAL HISTORY:  She does not smoke. She drinks socially. She is divorced.  REVIEW OF SYSTEMS: Positive for glasses, cataracts, hearing loss, high BP, balance problems, headaches, easy bruising and anemia. She has a history of diabetic peripheral neuropathy Alicia [name] is an 59 y.o. female.   Chief Complaint: numbness right hand catching middle and ring fingers HPI: see above  Past Medical History  Diagnosis Date  . Diabetes mellitus   . Hypertension   . Hyperlipidemia   . Gastroparesis   . Diabetic neuropathy ([place])   . GERD (gastroesophageal reflux disease)   . Complete deafness     Meningitis at age 40  . S/P appy   . Deaf   . H/O: C-section   . Ganglion cyst 06/22/2011  . Neuromuscular disorder ([place])     diabetic neuropathy  . Anemia     Past Surgical History  Procedure Laterality Date  . Appendectomy    . Tubal ligation    . Cesarean section    . Uterine fibroid embolization  2007  . Cardiolyte ef 77%, no ischemia in 2006  2006  . Wrist surgery      Cyst removed on left  . Wrist surgery Right 1985    tendon repair R wrist    Family History  Problem Relation Age of Onset  . Hypertension Mother   . Cancer Maternal  Aunt   . Cancer Maternal Grandmother   . Diabetes Father    Social History:  reports that she has never smoked. She has never used smokeless tobacco. She reports that she drinks alcohol. She reports that she does not use illicit drugs.  Allergies:  Allergies  Allergen Reactions  . Sulfonamide Derivatives Swelling and Rash    REACTION: rash, swelling - "Lost BABY" - Terrible itching.   . Lipitor [Atorvastatin Calcium] Other (See Comments)    Muscle Aches - Mild-Moderate - completely resolved with D/C of atorva.    . Ramipril Other (See Comments)    REACTION: cough    No prescriptions prior to admission    Results for orders placed or performed during the hospital encounter of 04/20/15 (from the past 48 hour(s))  Basic metabolic panel     Status: Abnormal   Collection Time: 04/19/15 10:30 AM  Result Value Ref Range   Sodium 140 135 - 145 mmol/L   Potassium 3.9 3.5 - 5.1 mmol/L   Chloride 104 101 - 111 mmol/L   CO2 28 22 - 32 mmol/L   Glucose, Bld 206 (H) 65 - 99 mg/dL   BUN 12 6 - 20 mg/dL   Creatinine, Ser 0.54 0.44 - 1.00 mg/dL   Calcium 10.1 8.9 - 10.3 mg/dL   GFR calc non Af Amer >60 >60 mL/min   GFR calc Af Amer >60 >60 mL/min    Comment: (NOTE) The eGFR has been calculated using the CKD EPI equation. This calculation has not been validated in all clinical situations. eGFR's persistently <60 mL/min signify possible Chronic Kidney Disease.    Anion gap 8 5 - 15    No results found.   Pertinent items are noted in HPI.  Height $Remov'5\' 4"'IuunHB$  (1.626 m), weight 78.472 kg (173 lb).  General appearance: alert, cooperative and appears stated age Head: Normocephalic, without obvious abnormality Neck: no JVD Resp: clear to auscultation bilaterally Cardio: regular rate and rhythm, S1, S2 normal, no murmur, click, rub or gallop GI: soft, non-tender; bowel sounds normal; no masses,  no organomegaly Extremities: numbness hand and catching ring and middle fingers Pulses: 2+ and symmetric Skin: Skin color, texture, turgor normal. No rashes or lesions Neurologic: Grossly normal Incision/Wound: na  Assessment/Plan X-rays of her hands reveal CMC arthritis bilaterally.  DIAGNOSIS: (1) STS middle fingers bilaterally, ring finger on the right. (2)  carpal tunnel syndrome. (3) Possible peripheral neuropathy secondary to diabetes. (4) CMC arthritis bilaterally right greater than left [place] stage II right and stage I left  (5) cubital tunnel.   We would recommend consideration of release of the A-1  pulley of the right middle and right ring fingers, decompression possible transposition to the ulnar nerve at her elbow, carpal tunnel release right hand. Pre, peri and post op care are discussed along with risks and complications. Patient is aware there is no guarantee with surgery, possibility of infection, injury to arteries, nerves, and tendons, incomplete relief and dystrophy. She is advised of the possibility of transposition to the nerve with the use of a splint and that we are attempting to halt the process and hopefully allow it to get better. She is also advised we would probably recommend release of the carpal tunnel syndrome and middle finger trigger left side following her right side. She was seen with an interpreter.  Bralen Wiltgen R 04/20/2015, 9:19 AM

## 2015-04-20 NOTE — Progress Notes (Signed)
Assisted Dr. Germeroth with right, ultrasound guided, axillary block. Side rails up, monitors on throughout procedure. See vital signs in flow sheet. Tolerated Procedure well. 

## 2015-04-20 NOTE — Op Note (Signed)
Dictation Number [id number]

## 2015-04-20 NOTE — Brief Op Note (Signed)
04/20/2015  12:23 PM  PATIENT:  Alicia [name]  59 y.o. female  PRE-OPERATIVE DIAGNOSIS:  right carpal tunnel syndrome, right cubital tunnel, Stenosing Tenosynovitis right middle and right ring fingers  POST-OPERATIVE DIAGNOSIS:  right carpal tunnel syndrome, right cubital tunnel, Stenosing Tenosynovitis right middle and right ring fingers  PROCEDURE:  Procedure(s): RIGHT CARPAL TUNNEL RELEASE (Right) RIGHT ULNAR NERVE DECOMPRESSION (Right) RELEASE TRIGGER FINGER/A-1 PULLEY RIGHT MIDDLE FINGER,RIGHT RING FINGER (Right)  SURGEON:  Surgeon(s) and Role:    * Daryll Brod, MD - Primary  PHYSICIAN ASSISTANT:   ASSISTANTS: none   ANESTHESIA:   regional and general  EBL:  Total I/O In: 1000 [I.V.:1000] Out: -   BLOOD ADMINISTERED:none  DRAINS: none   LOCAL MEDICATIONS USED:  NONE  SPECIMEN:  No Specimen  DISPOSITION OF SPECIMEN:  N/A  COUNTS:  YES  TOURNIQUET:   Total Tourniquet Time Documented: Upper Arm (Right) - 35 minutes Total: Upper Arm (Right) - 35 minutes   DICTATION: .Other Dictation: Dictation Number [id number]  PLAN OF CARE: Discharge to home after PACU  PATIENT DISPOSITION:  PACU - hemodynamically stable.

## 2015-04-20 NOTE — Anesthesia Preprocedure Evaluation (Addendum)
Anesthesia Evaluation  Patient identified by MRN, date of birth, ID band Patient awake    Reviewed: Allergy & Precautions, NPO status , Patient's Chart, lab work & pertinent test results  Airway Mallampati: II  TM Distance: >3 FB Neck ROM: Full    Dental no notable dental hx.    Pulmonary asthma ,    Pulmonary exam normal breath sounds clear to auscultation       Cardiovascular hypertension, Pt. on home beta blockers and Pt. on medications negative cardio ROS Normal cardiovascular exam Rhythm:Regular Rate:Normal     Neuro/Psych  Headaches, Seizures -,  negative neurological ROS  negative psych ROS   GI/Hepatic Neg liver ROS, GERD  ,  Endo/Other  diabetes, Poorly Controlled, Type 2  Renal/GU negative Renal ROS  negative genitourinary   Musculoskeletal negative musculoskeletal ROS (+) Arthritis ,   Abdominal (+) + obese,   Peds negative pediatric ROS (+)  Hematology negative hematology ROS (+) anemia ,   Anesthesia Other Findings   Reproductive/Obstetrics negative OB ROS                           Anesthesia Physical Anesthesia Plan  ASA: II  Anesthesia Plan: Regional and General   Post-op Pain Management:    Induction: Intravenous  Airway Management Planned: LMA  Additional Equipment:   Intra-op Plan:   Post-operative Plan: Extubation in OR  Informed Consent: I have reviewed the patients History and Physical, chart, labs and discussed the procedure including the risks, benefits and alternatives for the proposed anesthesia with the patient or authorized representative who has indicated his/her understanding and acceptance.   Dental advisory given  Plan Discussed with: CRNA  Anesthesia Plan Comments:       Anesthesia Quick Evaluation

## 2015-04-21 ENCOUNTER — Encounter (HOSPITAL_BASED_OUTPATIENT_CLINIC_OR_DEPARTMENT_OTHER): Payer: Self-pay | Admitting: Orthopedic Surgery

## 2015-05-06 ENCOUNTER — Other Ambulatory Visit: Payer: Self-pay | Admitting: Family Medicine

## 2015-05-21 ENCOUNTER — Ambulatory Visit (INDEPENDENT_AMBULATORY_CARE_PROVIDER_SITE_OTHER): Payer: Medicare Other | Admitting: Ophthalmology

## 2015-05-21 DIAGNOSIS — H43813 Vitreous degeneration, bilateral: Secondary | ICD-10-CM | POA: Diagnosis not present

## 2015-05-21 DIAGNOSIS — E113593 Type 2 diabetes mellitus with proliferative diabetic retinopathy without macular edema, bilateral: Secondary | ICD-10-CM

## 2015-05-21 DIAGNOSIS — E11319 Type 2 diabetes mellitus with unspecified diabetic retinopathy without macular edema: Secondary | ICD-10-CM

## 2015-05-21 DIAGNOSIS — H35033 Hypertensive retinopathy, bilateral: Secondary | ICD-10-CM | POA: Diagnosis not present

## 2015-05-21 DIAGNOSIS — I10 Essential (primary) hypertension: Secondary | ICD-10-CM | POA: Diagnosis not present

## 2015-06-02 DIAGNOSIS — G5622 Lesion of ulnar nerve, left upper limb: Secondary | ICD-10-CM | POA: Insufficient documentation

## 2015-06-02 DIAGNOSIS — G5621 Lesion of ulnar nerve, right upper limb: Secondary | ICD-10-CM | POA: Insufficient documentation

## 2015-06-12 ENCOUNTER — Other Ambulatory Visit: Payer: Self-pay | Admitting: Family Medicine

## 2015-06-14 DIAGNOSIS — S6000XA Contusion of unspecified finger without damage to nail, initial encounter: Secondary | ICD-10-CM | POA: Diagnosis not present

## 2015-06-14 DIAGNOSIS — S60221A Contusion of right hand, initial encounter: Secondary | ICD-10-CM | POA: Diagnosis not present

## 2015-06-14 DIAGNOSIS — M25541 Pain in joints of right hand: Secondary | ICD-10-CM | POA: Diagnosis not present

## 2015-06-23 ENCOUNTER — Other Ambulatory Visit: Payer: Self-pay | Admitting: Family Medicine

## 2015-06-23 DIAGNOSIS — Z794 Long term (current) use of insulin: Principal | ICD-10-CM

## 2015-06-23 DIAGNOSIS — I10 Essential (primary) hypertension: Secondary | ICD-10-CM

## 2015-06-23 DIAGNOSIS — E119 Type 2 diabetes mellitus without complications: Secondary | ICD-10-CM

## 2015-07-02 ENCOUNTER — Encounter: Payer: Self-pay | Admitting: Family Medicine

## 2015-07-02 ENCOUNTER — Ambulatory Visit (INDEPENDENT_AMBULATORY_CARE_PROVIDER_SITE_OTHER): Payer: Medicare Other | Admitting: Family Medicine

## 2015-07-02 VITALS — BP 150/72 | HR 96 | Temp 98.3°F | Ht 64.0 in | Wt 180.0 lb

## 2015-07-02 DIAGNOSIS — Z794 Long term (current) use of insulin: Secondary | ICD-10-CM

## 2015-07-02 DIAGNOSIS — E118 Type 2 diabetes mellitus with unspecified complications: Secondary | ICD-10-CM | POA: Diagnosis present

## 2015-07-02 DIAGNOSIS — I10 Essential (primary) hypertension: Secondary | ICD-10-CM

## 2015-07-02 LAB — POCT GLYCOSYLATED HEMOGLOBIN (HGB A1C): Hemoglobin A1C: 8.4

## 2015-07-02 NOTE — Patient Instructions (Signed)
Thank you for coming in today!  - For diabetes: Start working on avoiding or decreasing carbohydrates and eating more consistently.  - For blood pressure: Keep taking your medications - pick up your medications next Tuesday.  - For your back keep active and taking aleve as needed. You can also use warm pads. We can discuss this at your next visit.   Lets follow up in 3 months to discuss diabetes and your back.   Our clinic's number is [phone]. Feel free to call any time with questions or concerns. We will answer any questions after hours with our 24-hour emergency line at that number as well.   - Dr. Bonner Puna

## 2015-07-02 NOTE — Progress Notes (Signed)
Subjective: Alicia [name] is a 59 y.o. female here for diabetes follow up.   Endorses 5 - 6 weeks of mild-moderate constant, worsening to severe 3 days ago with bending/lifting, lower back pain without radiation. No bowel or bladder dysfunction. No trauma. Improved with aleve.  She has been taking her medications as directed without changes or missed doses. AM CBGs have been between 80s- 90s with most post prandials near 200 but occasionally drops to 50s if she skips meals which she does at least weekly. She has symptoms of heart pounding and light-headedness during these times and aborts them by eating crackers/juice. She denies polyuria, polydipsia or vision changes.   Has not taken BP medications in the past few weeks because she ran out. She can not find her bottle of thiazide medication. Has been able to take crestor every other day without myalgias.   - ROS: Denies fever, chills, weight loss, dizziness, vision changes, syncope, polyuria, nocturia, numbness, polydipsia, chest pain, new wounds.  - [name]: Non-smoker, occasional EtOH, no illicit drugs. - Medications: reviewed and updated  Objective: BP 150/72 mmHg  Pulse 96  Temp(Src) 98.3 F (36.8 C) (Oral)  Ht 5\' 4"  (1.626 m)  Wt 180 lb (81.647 kg)  BMI 30.88 kg/m2  SpO2 96% Gen: Well-appearing 59 y.o.female in no distress HEENT: Normocephalic, sclerae/conjunctivae clear, PERRL, MMM, posterior oropharynx clear, good dentition Neck: Neck supple, no masses or lymphadenopathy; thyroid not enlarged  Pulm: Non-labored; CTAB, no wheezes  CV: Regular rate, no murmur appreciated; no LE edema, no JVD GI: Normoactive BS; soft, non-tender, non-distended, no HSM Skin: No wounds or rashes, no acanthosis nigricans See diabetic foot exam - simple  Neuro: CN II-XII without deficits, sensation intact to light touch, steady gait.  Assessment & Plan: Alicia [name] is a 59 y.o. female here for diabetes, improving control.    Type II diabetes  mellitus with complication Improved control to 8.4% on same regimen with hypoglycemic episodes caused by irregular diet. Discussed moderation of carbohydrate intake and decreasing overall calories as treatment of weight gain. Do not want to increase insulin while she's having low CBGs with symptoms. Tolerating statin QOD, ASA. H/o ACE intolerance.   Essential HTN Above goal due to not having HCTZ, lost it. Will refill prescription. No symptoms. Will recheck at f/u.   Follow up 3 months.

## 2015-07-06 NOTE — Assessment & Plan Note (Signed)
Improved control to 8.4% on same regimen with hypoglycemic episodes caused by irregular diet. Discussed moderation of carbohydrate intake and decreasing overall calories as treatment of weight gain. Do not want to increase insulin while she's having low CBGs with symptoms. Tolerating statin QOD, ASA. H/o ACE intolerance.

## 2015-07-06 NOTE — Assessment & Plan Note (Signed)
Above goal due to not having HCTZ, lost it. Will refill prescription. No symptoms. Will recheck at f/u.

## 2015-07-15 ENCOUNTER — Other Ambulatory Visit: Payer: Self-pay | Admitting: Family Medicine

## 2015-07-29 ENCOUNTER — Other Ambulatory Visit: Payer: Self-pay | Admitting: Family Medicine

## 2015-08-05 ENCOUNTER — Encounter: Payer: Self-pay | Admitting: Family Medicine

## 2015-08-05 ENCOUNTER — Ambulatory Visit (INDEPENDENT_AMBULATORY_CARE_PROVIDER_SITE_OTHER): Payer: Medicare Other | Admitting: Family Medicine

## 2015-08-05 VITALS — BP 150/80 | HR 85 | Temp 97.6°F | Ht 64.0 in | Wt 178.2 lb

## 2015-08-05 DIAGNOSIS — E118 Type 2 diabetes mellitus with unspecified complications: Secondary | ICD-10-CM | POA: Diagnosis not present

## 2015-08-05 DIAGNOSIS — Z794 Long term (current) use of insulin: Secondary | ICD-10-CM | POA: Diagnosis not present

## 2015-08-05 DIAGNOSIS — M7522 Bicipital tendinitis, left shoulder: Secondary | ICD-10-CM | POA: Diagnosis not present

## 2015-08-05 NOTE — Patient Instructions (Addendum)
Most shoulder pain is caused by soft tissue problems rather than arthritis.  Rotator cuff tendonitis or tendonosis, rotator cuff tears, impingement syndrome and cartilege (labrum tears) are a few of the common causes of shoulder pain.  Fortunately, most of these can be treated with conservative measures as outlined below.  Do not do the following:  Any work with the arms above shoulder level (especially lifting) until the pain has subsided.  Sleep on the affected side.  Especially avoid sleeping with your arm under your head or your pillow.  This is a habit that is hard to break.   Do the following:  Do the shoulder exercises we reviewed twice daily followed by ice for 10 minutes.  You need to rest it as much as possible  Use of over the counter pain meds can be of help.  Tylenol (or acetaminophen) is the safest to use.  It often helps to take this regularly.  You can take up to 325 mg tablets, 2 at a time, 5 times daily.   Shoulder Range of Motion Exercises Shoulder range of motion (ROM) exercises are designed to keep the shoulder moving freely. They are often recommended for people who have shoulder pain. MOVEMENT EXERCISE When you are able, do this exercise 5-6 days per week, or as told by your health care provider. Work toward doing 2 sets of 10 swings. Pendulum Exercise How To Do This Exercise Lying Down 3. Lie face-down on a bed with your abdomen close to the side of the bed. 4. Let your arm hang over the side of the bed. 5. Relax your shoulder, arm, and hand. 6. Slowly and gently swing your arm forward and back. Do not use your neck muscles to swing your arm. They should be relaxed. If you are struggling to swing your arm, have someone gently swing it for you. When you do this exercise for the first time, swing your arm at a 15 degree angle for 15 seconds, or swing your arm 10 times. As pain lessens over time, increase the angle of the swing to 30-45 degrees. 7. Repeat steps 1-4 with  the other arm. How To Do This Exercise While Standing 3. Stand next to a sturdy chair or table and hold on to it with your hand.  Bend forward at the waist.  Bend your knees slightly.  Relax your other arm and let it hang limp.  Relax the shoulder blade of the arm that is hanging and let it drop.  While keeping your shoulder relaxed, use body motion to swing your arm in small circles. The first time you do this exercise, swing your arm for about 30 seconds or 10 times. When you do it next time, swing your arm for a little longer.  Stand up tall and relax.  Repeat steps 1-7, this time changing the direction of the circles. 4. Repeat steps 1-8 with the other arm. STRETCHING EXERCISES Do these exercises 3-4 times per day on 5-6 days per week or as told by your health care provider. Work toward holding the stretch for 20 seconds. Stretching Exercise 1 1. Lift your arm straight out in front of you. 2. Bend your arm 90 degrees at the elbow (right angle) so your forearm goes across your body and looks like the letter "L." 3. Use your other arm to gently pull the elbow forward and across your body. 4. Repeat steps 1-3 with the other arm. Stretching Exercise 2 You will need a towel or rope  for this exercise. 1. Bend one arm behind your back with the palm facing outward. 2. Hold a towel with your other hand. 3. Reach the arm that holds the towel above your head, and bend that arm at the elbow. Your wrist should be behind your neck. 4. Use your free hand to grab the free end of the towel. 5. With the higher hand, gently pull the towel up behind you. 6. With the lower hand, pull the towel down behind you. 7. Repeat steps 1-6 with the other arm. STRENGTHENING EXERCISES Do each of these exercises at four different times of day (sessions) every day or as told by your health care provider. To begin with, repeat each exercise 5 times (repetitions). Work toward doing 3 sets of 12 repetitions or as  told by your health care provider. Strengthening Exercise 1 You will need a light weight for this activity. As you grow stronger, you may use a heavier weight. 2. Standing with a weight in your hand, lift your arm straight out to the side until it is at the same height as your shoulder. 3. Bend your arm at 90 degrees so that your fingers are pointing to the ceiling. 4. Slowly raise your hand until your arm is straight up in the air. 5. Repeat steps 1-3 with the other arm. Strengthening Exercise 2 You will need a light weight for this activity. As you grow stronger, you may use a heavier weight. 1. Standing with a weight in your hand, gradually move your straight arm in an arc, starting at your side, then out in front of you, then straight up over your head. 2. Gradually move your other arm in an arc, starting at your side, then out in front of you, then straight up over your head. 3. Repeat steps 1-2 with the other arm. Strengthening Exercise 3 You will need an elastic band for this activity. As you grow stronger, gradually increase the size of the bands or increase the number of bands that you use at one time. 1. While standing, hold an elastic band in one hand and raise that arm up in the air. 2. With your other hand, pull down the band until that hand is by your side. 3. Repeat steps 1-2 with the other arm.   This information is not intended to replace advice given to you by your health care provider. Make sure you discuss any questions you have with your health care provider.   Document Released: 03/18/2003 Document Revised: 11/03/2014 Document Reviewed: 06/15/2014 Elsevier Interactive Patient Education [employer].

## 2015-08-05 NOTE — Progress Notes (Signed)
Subjective: Alicia [name] is a 60 y.o. right-handed female presenting for shoulder pain.  2 years of intermittent, now worsening, achy anterior left arm/shoulder pain that is moderate to severe when provoked by use of the arm. No injury. No radiation, neck or elbow pain. 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 Aleve, warm and cold compresses have not helped much. She says she is not really able to rest it.  Objective: BP 150/80 mmHg  Pulse 85  Temp(Src) 97.6 F (36.4 C) (Oral)  Ht 5\' 4"  (1.626 m)  Wt 178 lb 3.2 oz (80.831 kg)  BMI 30.57 kg/m2 Gen: Pleasant, deaf 60 y.o. female in no distress Shoulder Exam: Inspection reveals no deformities, malalignment, atrophy or asymmetry No tenderness over AC joint + tenderness in bicipital groove ROM is full in all planes, though painful on left Rotator cuff strength normal throughout. +pain with empty can test and back lift off  No signs of impingement with negative Neer and Hawkin's tests + yergason's and speeds testing on left  Old imaging reviewed: She has many essentially negative XR's for chronic pain, including 2-view left humerus 11/14/2014. No history of MRI or ultrasound.  Assessment/Plan: Alicia [name] is a 60 y.o. female here for biceps tendinopathy and possibly rotator cuff pathology.  - Advised that rest is the cornerstone of conservative management along with judicious NSAIDs and ROM exercises. Rest of that arm is difficult because she recently had right carpal tunnel release and relies on her left arm at work.  - Refer to sports medicine, consider U/S.

## 2015-08-11 ENCOUNTER — Other Ambulatory Visit: Payer: Self-pay | Admitting: *Deleted

## 2015-08-11 MED ORDER — METFORMIN HCL 1000 MG PO TABS: ORAL_TABLET | ORAL | Status: DC

## 2015-08-12 ENCOUNTER — Other Ambulatory Visit: Payer: Self-pay | Admitting: Family Medicine

## 2015-08-13 ENCOUNTER — Ambulatory Visit: Payer: Medicare Other | Admitting: Family Medicine

## 2015-08-20 DIAGNOSIS — G5602 Carpal tunnel syndrome, left upper limb: Secondary | ICD-10-CM | POA: Diagnosis not present

## 2015-08-20 DIAGNOSIS — G5622 Lesion of ulnar nerve, left upper limb: Secondary | ICD-10-CM | POA: Diagnosis not present

## 2015-08-20 DIAGNOSIS — M65332 Trigger finger, left middle finger: Secondary | ICD-10-CM | POA: Diagnosis not present

## 2015-08-24 ENCOUNTER — Telehealth: Payer: Self-pay | Admitting: Family Medicine

## 2015-08-24 NOTE — Telephone Encounter (Signed)
Left voice message using sign language interpreter for to call nurse back or call for an appointment.  Derl Barrow, RN

## 2015-08-24 NOTE — Telephone Encounter (Signed)
Pt called using a interpreter for sign language. She needs to talk to a nurse about some symptoms she is having. jw

## 2015-08-25 ENCOUNTER — Ambulatory Visit (INDEPENDENT_AMBULATORY_CARE_PROVIDER_SITE_OTHER): Payer: Medicare Other | Admitting: Family Medicine

## 2015-08-25 ENCOUNTER — Encounter: Payer: Self-pay | Admitting: Family Medicine

## 2015-08-25 VITALS — BP 140/65 | HR 80 | Temp 97.7°F | Wt 178.5 lb

## 2015-08-25 DIAGNOSIS — J209 Acute bronchitis, unspecified: Secondary | ICD-10-CM | POA: Diagnosis not present

## 2015-08-25 MED ORDER — AZITHROMYCIN 250 MG PO TABS: ORAL_TABLET | ORAL | Status: AC

## 2015-08-25 NOTE — Progress Notes (Signed)
   Subjective:    Patient ID: Alicia [name], female    DOB: [birth year], 60 y.o.   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   Past medical history, surgical, family, and social history reviewed and updated in the EMR as appropriate.  Objective:  BP 140/65 mmHg  Pulse 80  Temp(Src) 97.7 F (36.5 C) (Oral)  Wt 178 lb 8 oz (80.967 kg) Vitals and nursing note reviewed  General: no apparent distress  Eyes: PERRL, EOMI ENTM: there is a piece of cotton in the right external ear canal, removed with curette; right TM not visualized due to obstructed wax. Left TM is pearly gray, no effusion or erythema. Mild posterior pharyngeal erythema CV: normal rate, regular rhythm, no murmurs, rubs or gallop.  Resp: left side clear; on the RUL and RLL there are diffuse rhonchi and expiratory wheezes; she has normal effort and rate of breathing.   Assessment & Plan:   1. Acute bronchitis, unspecified organism Overall well appearing, afebrile. She has fairly significant rhonchi and wheezes on the right side of her lung that would be concerning for bronchitis vs CAP. Discussed possible viral vs bacterial etiology. She has a fairly extensive problem list including diabetes, asthma (which she said she did not have) that would be  concerning for her to worsen quickly if it is bacterial; recommended she take z-pak and follow up if not improving.

## 2015-08-25 NOTE — Patient Instructions (Addendum)
Your right lung has some extra noise in it that makes it sound like either a bronchitis or pneumonia. This could still be a virus (which would also be causing your sore throat, other symptoms), but in case it is a bacteria we will treat you with an antibiotic called Azithromycin for 5 days.  Ear pain: most likely from sore throat, the eustachian tubes connecting your ears and back of your throat can get irritated. Avoid using the cotton swabs. Try warm compresses/wash clothes. Also use tylenol or ibuprofen as needed.  For the Ear wax:  Get some DEBROX ear drops, or make your own irrigation solution with a combination of white vinegar and water or hydrogen peroxide and water. Use a few drops in the right ear daily to moisten the ear wax, after 1-2 weeks if you want to come back to the clinic to get this washed out or scrapped with a currette.   If your breathing gets worse, you develop really bad fevers, you need to be seen by a doctor either at the clinic here or go to the ED.   If you are not getting better after the antibiotic please return to the clinic.

## 2015-08-27 ENCOUNTER — Ambulatory Visit (INDEPENDENT_AMBULATORY_CARE_PROVIDER_SITE_OTHER): Payer: Medicare Other | Admitting: Family Medicine

## 2015-08-27 ENCOUNTER — Encounter: Payer: Self-pay | Admitting: Family Medicine

## 2015-08-27 VITALS — BP 140/83 | HR 92 | Ht 64.0 in | Wt 178.0 lb

## 2015-08-27 DIAGNOSIS — M25519 Pain in unspecified shoulder: Secondary | ICD-10-CM | POA: Insufficient documentation

## 2015-08-27 DIAGNOSIS — M25512 Pain in left shoulder: Secondary | ICD-10-CM

## 2015-08-27 MED ORDER — METHYLPREDNISOLONE ACETATE 40 MG/ML IJ SUSP
40.0000 mg | Freq: Once | INTRAMUSCULAR | Status: AC
  Administered 2015-08-27: 40 mg via INTRA_ARTICULAR

## 2015-08-27 NOTE — Progress Notes (Signed)
Patient ID: Alicia [name], female   DOB: 05/08/1956, 60 y.o.   MRN: [id number]  Alicia [name] - 60 y.o. female MRN [id number]  Date of birth: 1955-08-01    SUBJECTIVE:     Chief Complaint: Left shoulder pain 1 month  HPI: [place] forward about a month ago..  Had some mild shoulder that evening and was a little worse the next day.  Has a slightly but is still present mos  Pain with certain motions.  Has tried over-the-counter eds wth no relief ROS:     No fever, no other unusual arthralgias.  PERTINENT  PMH / PSH FH / / SH:  Past Medical, Surgical, Social, and Family History Reviewed & Updated in the EMR.  Pertinent findings include:  No history of prior shoulder surgery or injury.  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 Diabetes mellitus. Deaf.  OBJECTIVE: BP 140/83 mmHg  Pulse 92  Ht 5\' 4"  (1.626 m)  Wt 178 lb (80.74 kg)  BMI 30.54 kg/m2  Physical Exam:  Vital signs are reviewed. SHOULDERS: Symmetrical.  Left shoulder has full range of motion in all planes of the rotat  She has some pain with internal rotation and pain with resisted external rotatio  Supraspinatus testing is normal.  The biceps tendon is nontender.  The [facility] joint  VASCULAR: Radial pulses 2+B= SKIN: Skin over the left upper Extremity is without rash,, no erythema.Marland Kitchen  ULTRASOUND: There is slight amount  Arthritic change over the acromioclavicular joint.   The biceps tendon is normal and there is no increase fluid in the tendon sheath.  The subscapularis muscle has a small area of calcification  But no tear.  The supraspinatus muscle is normal.  The infraspinatus and teres minor muscles   R normal.  There is no sign of impingement  INJECTION: Patient was given informed consent, signed copy in the chart. Appropriate time out was taken. Area prepped and draped in usual sterile fashion. 1 cc of methylprednisolone 40 mg/ml plus  4 cc of 1% lidocaine without  epinephrine was injected into the LEFT SHOULDER subacromial bursa using a(n) posterior approach. The patient tolerated the procedure well. There were no complications. Post procedure instructions were given. .  ASSESSMENT & PLAN:  See problem based charting & AVS for pt instructions.

## 2015-08-27 NOTE — Assessment & Plan Note (Signed)
Rotator cuff muscles appear intact alication seen in the subscapular muscle.  Perhaps she had a small tear her  We gave her a subacromial bursa injection with corticosteroid. We placed her  on a home exercise program.  I'll see her back in one month.

## 2015-09-12 ENCOUNTER — Other Ambulatory Visit: Payer: Self-pay | Admitting: Family Medicine

## 2015-09-14 ENCOUNTER — Other Ambulatory Visit: Payer: Self-pay | Admitting: Family Medicine

## 2015-09-14 DIAGNOSIS — E119 Type 2 diabetes mellitus without complications: Secondary | ICD-10-CM | POA: Insufficient documentation

## 2015-09-14 DIAGNOSIS — IMO0001 Reserved for inherently not codable concepts without codable children: Secondary | ICD-10-CM

## 2015-09-14 DIAGNOSIS — Z794 Long term (current) use of insulin: Principal | ICD-10-CM

## 2015-09-15 ENCOUNTER — Telehealth: Payer: Self-pay | Admitting: *Deleted

## 2015-09-15 DIAGNOSIS — IMO0001 Reserved for inherently not codable concepts without codable children: Secondary | ICD-10-CM

## 2015-09-15 DIAGNOSIS — Z794 Long term (current) use of insulin: Principal | ICD-10-CM

## 2015-09-15 DIAGNOSIS — E119 Type 2 diabetes mellitus without complications: Principal | ICD-10-CM

## 2015-09-15 MED ORDER — ACCU-CHEK SOFTCLIX LANCETS MISC: Status: DC

## 2015-09-15 NOTE — Telephone Encounter (Signed)
Received fax from [facility] stating provider is in [place] for medicare and can not prescribe diabetic supplies.  Lancets resent under Dr. Nori Riis.  Derl Barrow, RN

## 2015-09-27 ENCOUNTER — Other Ambulatory Visit: Payer: Self-pay

## 2015-09-27 DIAGNOSIS — Z1231 Encounter for screening mammogram for malignant neoplasm of breast: Secondary | ICD-10-CM

## 2015-09-29 ENCOUNTER — Ambulatory Visit: Payer: Medicare Other | Admitting: Obstetrics and Gynecology

## 2015-10-01 ENCOUNTER — Encounter: Payer: Self-pay | Admitting: Family Medicine

## 2015-10-01 ENCOUNTER — Ambulatory Visit (INDEPENDENT_AMBULATORY_CARE_PROVIDER_SITE_OTHER): Payer: Medicare Other | Admitting: Family Medicine

## 2015-10-01 VITALS — BP 127/63 | HR 91 | Temp 97.9°F | Ht 64.0 in | Wt 174.0 lb

## 2015-10-01 DIAGNOSIS — I10 Essential (primary) hypertension: Secondary | ICD-10-CM

## 2015-10-01 DIAGNOSIS — E118 Type 2 diabetes mellitus with unspecified complications: Secondary | ICD-10-CM

## 2015-10-01 DIAGNOSIS — Z794 Long term (current) use of insulin: Secondary | ICD-10-CM

## 2015-10-01 LAB — POCT GLYCOSYLATED HEMOGLOBIN (HGB A1C): Hemoglobin A1C: 8.5

## 2015-10-01 NOTE — Patient Instructions (Addendum)
STOP novolog, and start taking lantus 10 units everyday.   Check your blood sugars 3 times per day:  - Once in the morning before eating - Once before dinner, your biggest meal - Once right before you go to sleep  Please keep a record of these and bring them to your appointment in 4 weeks.   - Apply warm compresses and take tylenol for the pain in your chest. You should also make sure you're always well supported with properly fitting bra to help with the pain.  - You are scheduled for a screening mammogram on 4/28.

## 2015-10-01 NOTE — Progress Notes (Signed)
Subjective: Alicia [name] is a 60 y.o. female here for diabetes follow up, accompanied by Alicia [name] interpretor.   She takes lantus 6u qHS but occasionally will take 11u if CBG >300mg /dl. This occurred several times over the past week since she's felt ill with cold-like symptoms. Also taking sliding scale novolog, reporting good compliance.   Usually around 220-250 mg/dl in AM. She had a single reading of 30 with typical symptoms because she did not eat that day and was very active.   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   On HCTZ 50mg , losartan 100mg , metoprolol 50mg  daily for HTN, no problems taking this. Denies HA, CP, LE swelling, dyspnea, cough.    Left breast pain: Started 3 weeks, off and on, some days without pain, other it is very tender to the touch some of the time, not all of the time. No change in clothing/bra, and no recent change in activity level. No palpable abnormalities. No contralateral pain, nipple discharge.   She has struggled with left shoulder pain, received injection about 1 month ago and complains of unchanged left shoulder pain.   - ROS: Denies fever, chills, weight loss, dizziness, vision changes, syncope, polyuria, nocturia, numbness, polydipsia, chest pain, new wounds.  - [place]: Non-smoker, occasional EtOH, no illicit drugs. - Medications: reviewed and updated  Objective: BP 127/63 mmHg  Pulse 91  Temp(Src) 97.9 F (36.6 C) (Oral)  Ht 5\' 4"  (1.626 m)  Wt 174 lb (78.926 kg)  BMI 29.85 kg/m2 Gen: Well-appearing 60 y.o.female in no distress HEENT: Normocephalic, sclerae/conjunctivae clear, PERRL, MMM, posterior oropharynx clear, good dentition Neck: Neck supple, no masses or lymphadenopathy; thyroid not enlarged  Pulm: Non-labored; CTAB, no wheezes  CV: Regular rate, no murmur appreciated; no LE edema, no JVD Breasts: No palpable masses in either breast, no nipple discharge. + tenderness to deeper palpation of pectoralis  muscles L > R. Left breast pain also reproduced with resisted left shoulder flexion/adduction.  GI: Normoactive BS; soft, non-tender, non-distended, no HSM Skin: No wounds or rashes See diabetic foot exam - simple  Neuro: CN II-XII without deficits, sensation intact to light touch, steady gait.  Assessment & Plan: Alicia [name] is a 60 y.o. female here for uncontrolled IDDM.  See problem-based charting.

## 2015-10-06 NOTE — Assessment & Plan Note (Signed)
At goal.  

## 2015-10-06 NOTE — Assessment & Plan Note (Signed)
Stable at 8.5 (from 8.4) still with rare hypoglycemia. Pt is on an odd regimen of very low doses of lantus and SSI, reports history of failed oral medications. She is reluctant to return to oral medications, and remains uncontrolled. After discussion with preceptor, plan is to stop novolog and slowly titrate up on lantus alone, starting at 10 units daily. Will return in 4 weeks with CBG logs. Inconsistent meals make eventual addition of single mealtime novolog with dinner (largest, most reliable meal) an option, but doubt TID novolog is necessary.

## 2015-10-07 ENCOUNTER — Telehealth: Payer: Self-pay | Admitting: Family Medicine

## 2015-10-07 NOTE — Telephone Encounter (Signed)
Discuss blood sugar discrepancies.  Please call patient back before 11:00.  Have to be at work at 11:30.

## 2015-10-07 NOTE — Telephone Encounter (Signed)
Return call to patient regarding her blood sugars.  Patient stated she stopped the Novolog and checking her blood sugars three times a day before meals as ordered.  However, last night her blood sugar was 400.  She took the 10 Units of Lantus. This morning blood sugar was 210.  Patient is very concern that her blood sugars are still high after stopping the Novolog.  Pt denies any symptoms other than a headache with is minor.  Please give patient a call. She does not get off work until 6 PM tonight.  Please advise.  Derl Barrow, RN

## 2015-10-11 ENCOUNTER — Other Ambulatory Visit: Payer: Self-pay | Admitting: Family Medicine

## 2015-10-11 DIAGNOSIS — M792 Neuralgia and neuritis, unspecified: Secondary | ICD-10-CM

## 2015-10-25 ENCOUNTER — Ambulatory Visit: Payer: Medicare Other | Admitting: Family Medicine

## 2015-10-26 ENCOUNTER — Ambulatory Visit: Payer: Worker's Compensation

## 2015-10-26 ENCOUNTER — Telehealth: Payer: Self-pay

## 2015-10-26 ENCOUNTER — Ambulatory Visit (INDEPENDENT_AMBULATORY_CARE_PROVIDER_SITE_OTHER): Payer: Worker's Compensation | Admitting: Family Medicine

## 2015-10-26 VITALS — BP 134/82 | HR 80 | Temp 98.9°F | Resp 18 | Wt 176.0 lb

## 2015-10-26 DIAGNOSIS — S6992XA Unspecified injury of left wrist, hand and finger(s), initial encounter: Secondary | ICD-10-CM

## 2015-10-26 DIAGNOSIS — S52532A Colles' fracture of left radius, initial encounter for closed fracture: Secondary | ICD-10-CM

## 2015-10-26 MED ORDER — OXYCODONE-ACETAMINOPHEN 10-325 MG PO TABS: 1.0000 | ORAL_TABLET | ORAL | Status: DC | PRN

## 2015-10-26 NOTE — Telephone Encounter (Signed)
Called pt's son Harrie Jeans and advised of appt at [facility] 9:30am tomorrow morning. Advised that pt needs to have photo ID for appointment.

## 2015-10-26 NOTE — Patient Instructions (Addendum)
IF you received an x-ray today, you will receive an invoice from [facility]. Please contact [facility] at [phone] with questions or concerns regarding your invoice.   IF you received labwork today, you will receive an invoice from [employer]. Please contact [facility] at [phone] with questions or concerns regarding your invoice.   Our billing staff will not be able to assist you with questions regarding bills from these companies.  You will be contacted with the lab results as soon as they are available. The fastest way to get your results is to activate your My Chart account. Instructions are located on the last page of this paperwork. If you have not heard from [name] regarding the results in 2 weeks, please contact this office.    Colles Fracture Colles fracture is a type of broken wrist. It means that your radius bone is broken or cracked. The radius is the bone of your forearm on the same side as your thumb. The forearm is the part of your arm that is between your elbow and your wrist. Your forearm is made up of two bones. The other bone is called the ulna. Often, when the radius is broken, the ulna may also be broken. A cast or splint is used to protect and prevent your injured bone from moving as it heals. CAUSES Common causes of this type of fracture include:  A hard, direct hit to the wrist.  Falling on an outstretched hand.  Trauma, such as a car accident or a fall. RISK FACTORS You may be at higher risk for this type of fracture if:  You participate in contact sports and high-risk sports, such as skiing, biking, and ice skating.  You smoke.  You drink more than three alcoholic beverages per day.  You have low or lowered bone density (osteoporosis or osteopenia).  You are a young child or an older adult.  You are a woman who has gone through menopause.  You have a history of previous fractures.  You are not  getting enough calcium or vitamin D. SIGNS AND SYMPTOMS Symptoms of Colles fracture may include tenderness, bruising, and inflammation. Additionally, your wrist may hang in an odd position, appear deformed, and be difficult to move. DIAGNOSIS Diagnosis may include:  Physical exam.  X-ray. TREATMENT Treatment depends on many factors, including the severity of the fracture, your age, and your activity level. Treatment for Colles fracture can be nonsurgical or surgical. Nonsurgical Treatment A cast or a splint may be applied to your wrist if the bone is in a good position. If the fracture is not in a good position, it may be necessary for your health care provider to realign it before applying a cast or a splint. Usually, a cast or a splint will be worn for several weeks. Surgical Treatment Sometimes, if the fractured bone is severely displaced, surgery is required to help hold it together as it heals. Depending on the fracture, there are a number of options for holding the bone in place while it heals, such as a cast and metal pins. HOME CARE INSTRUCTIONS If You Have a Cast:  Do not stick anything inside the cast to scratch your skin. Doing that increases your risk of infection.  Check the skin around the cast every day. Report any concerns to your health care provider. You may put lotion on dry skin around the edges of the cast. Do not apply lotion to the skin underneath the cast. If  You Have a Splint:  Wear it as directed by your health care provider. Remove it only as directed by your health care provider.  Loosen the splint if your fingers become numb and tingle, or if they turn cold and blue. Bathing  Cover the cast or splint with a watertight plastic bag to protect it from water while you bathe or shower. Do not let the cast or splint get wet. Managing Pain, Stiffness, and Swelling  If directed, apply ice to the injured area:  Put ice in a plastic bag.  Place a towel between  your skin and the bag.  Leave the ice on for 20 minutes, 2-3 times a day.  Move your fingers often to avoid stiffness and to lessen swelling.  Raise the injured area above the level of your heart while you are sitting or lying down. Driving  Do not drive or operate heavy machinery while taking pain medicine.  Do not drive while wearing a cast or splint on a hand that you use for driving. Activity  Return to your normal activities as directed by your health care provider. Ask your health care provider what activities are safe for you.  Perform range-of-motion exercises only as directed by your health care provider. Safety  Do not use your injured limb to support your body weight until your health care provider says that you can. General Instructions  Do not put pressure on any part of the cast or splint until it is fully hardened. This may take several hours.  Keep the cast or splint clean and dry.  Do not use any tobacco products, including cigarettes, chewing tobacco, or electronic cigarettes. Tobacco can delay bone healing. If you need help quitting, ask your health care provider.  Take medicines only as directed by your health care provider.  Keep all follow-up visits as directed by your health care provider. This is important. SEEK MEDICAL CARE IF:  Your cast or splint becomes wet or damaged or suddenly feels too tight.  You have a fever.  You have chills.  You have continued severe pain or more swelling than you did before the cast or splint was put on your wrist. SEEK IMMEDIATE MEDICAL CARE IF:  Your hand or fingernails on the injured arm turn blue or gray, or they feel cold or numb.  You have decreased feeling in the fingers of your injured arm.   This information is not intended to replace advice given to you by your health care provider. Make sure you discuss any questions you have with your health care provider.   Document Released: 07/05/2006 Document  Revised: 07/10/2014 Document Reviewed: 02/02/2014 Elsevier Interactive Patient Education [employer].

## 2015-10-26 NOTE — Progress Notes (Addendum)
Subjective:    Patient ID: Alicia [name], female    DOB: 1955/07/12, 60 y.o.   MRN: [id number] By signing my name below, I, Zola Button, attest that this documentation has been prepared under the direction and in the presence of Delman Cheadle, MD.  Electronically Signed: Zola Button, Medical Scribe. 10/26/2015. 2:15 PM.  Chief Complaint  Patient presents with  . Hand Injury    Left hand/patient fell at work about 2hrs ago    HPI HPI Comments: Alicia [name] is a 60 y.o. female with a history of DM, diabetic neuropathy, and osteoarthritis who presents to the [facility] complaining of sudden onset, left wrist pain secondary to a fall that occurred at work [employer]. Patient slipped on a blanket and fell backwards; she tried to catch herself with her left hand. The pain radiates up to her forearm and is primarily in the radial aspect of her wrist. She did try ice, but she has not tried any OTC medications. She took her chronic  diclofenac, gabapentin, hydrocodone, and Flexeril today. Patient denies prior wrist injuries.    Review of Systems  Constitutional: Positive for activity change. Negative for fever and appetite change.  Gastrointestinal: Negative for vomiting.  Musculoskeletal: Positive for myalgias, joint swelling and arthralgias. Negative for gait problem.  Skin: Negative for color change, pallor, rash and wound.  Allergic/Immunologic: Positive for immunocompromised state.  Neurological: Positive for weakness. Negative for numbness.  Hematological: Does not bruise/bleed easily.       Objective:  BP 134/82 mmHg  Pulse 80  Temp(Src) 98.9 F (37.2 C) (Oral)  Resp 18  Wt 176 lb (79.833 kg)  SpO2 96%  Physical Exam  Constitutional: She is oriented to person, place, and time. She appears well-developed and well-nourished. No distress.  HENT:  Head: Normocephalic and atraumatic.  Mouth/Throat: Oropharynx is clear and moist. No oropharyngeal exudate.  Eyes: Pupils  are equal, round, and reactive to light.  Neck: Neck supple.  Cardiovascular: Normal rate.   Pulses:      Radial pulses are 2+ on the right side, and 2+ on the left side.  2+ ulnar pulses. Excellent capillary refill.  Pulmonary/Chest: Effort normal.  Musculoskeletal: She exhibits tenderness.  Very mild active ROM in phalanx, moderate in 1st phalanx. No pain over metacarpals. Obvious swelling over the proximal volar forearm. Severe pain with even minimal pronation or supination.  Neurological: She is alert and oriented to person, place, and time. No cranial nerve deficit.  Skin: Skin is warm and dry. No rash noted.  Hands warm and dry.  Psychiatric: She has a normal mood and affect. Her behavior is normal.  Nursing note and vitals reviewed.  Dg Wrist Complete Left  10/26/2015  CLINICAL DATA:  Status post fall on outstretched hand at work today EXAM: LEFT WRIST - COMPLETE 3+ VIEW COMPARISON:  Left wrist series dated December 09, 2010 FINDINGS: The patient has sustained an impacted fracture of the distal left radial metaphysis. There is mild angulation with the apex volar. The adjacent ulna is intact. The observed carpal bones appear intact though there is subtle irregularity through the junction of the middle and distal poles of the scaphoid. This is seen on one view and not observed on the stick dedicated scaphoid series. IMPRESSION: There is an impacted mildly angulated fracture of the distal left radial metaphysis. No definite fracture is observed elsewhere. Electronically Signed   By: David  Martinique M.D.   On: 10/26/2015 14:44  Assessment & Plan:   Alicia [name] (son): [phone] Alicia [name] (daughter): [phone]  1. Left wrist injury, initial encounter   2. Colles' fracture of left radius, closed, initial encounter    Pt is deaf -her son Alicia [name] today translates. She is here on [employer] - fell backwards with [place] about 3 hrs ago resulting in an angulated impacted distal radial fracture.  Placed in a double sugar tong splint with sling.  oxycodone prn pain (stop chronic hydrocodone). Urgent ortho referral - needs to be seen within 24 hrs of injury as I am concerned that the fracture may need to be reduced in order to preserve full wrist function and least pain esp since she only communicates through sign language.  Hand surg has agreed to see pt tomorrow morning. Out of work until released by hand surg. RICE.  Orders Placed This Encounter  Procedures  . DG Wrist Complete Left    Standing Status: Future     Number of Occurrences: 1     Standing Expiration Date: 10/25/2016    Order Specific Question:  Reason for Exam (SYMPTOM  OR DIAGNOSIS REQUIRED)    Answer:  fell on outstreched hand behind her 2 hrs ago, severe pain and swelling over radial aspect of distal forearm, minimal ROM in all hand and arm due to pain    Order Specific Question:  Is the patient pregnant?    Answer:  No    Order Specific Question:  Preferred imaging location?    Answer:  External  . Ambulatory referral to Orthopedic Surgery    Referral Priority:  Urgent    Referral Type:  Surgical    Referral Reason:  Specialty Services Required    Requested Specialty:  Orthopedic Surgery    Number of Visits Requested:  1    Meds ordered this encounter  Medications  . oxyCODONE-acetaminophen (PERCOCET) 10-325 MG tablet    Sig: Take 1 tablet by mouth every 4 (four) hours as needed for pain.    Dispense:  30 tablet    Refill:  0    I personally performed the services described in this documentation, which was scribed in my presence. The recorded information has been reviewed and considered, and addended by me as needed.  Delman Cheadle, MD MPH

## 2015-10-27 ENCOUNTER — Ambulatory Visit: Payer: Medicare Other | Admitting: Family Medicine

## 2015-11-01 DIAGNOSIS — S52502A Unspecified fracture of the lower end of left radius, initial encounter for closed fracture: Secondary | ICD-10-CM

## 2015-11-01 DIAGNOSIS — E1140 Type 2 diabetes mellitus with diabetic neuropathy, unspecified: Secondary | ICD-10-CM | POA: Insufficient documentation

## 2015-11-01 HISTORY — DX: Unspecified fracture of the lower end of left radius, initial encounter for closed fracture: S52.502A

## 2015-11-02 ENCOUNTER — Telehealth: Payer: Self-pay | Admitting: *Deleted

## 2015-11-02 ENCOUNTER — Encounter (HOSPITAL_BASED_OUTPATIENT_CLINIC_OR_DEPARTMENT_OTHER): Payer: Self-pay | Admitting: *Deleted

## 2015-11-02 NOTE — Telephone Encounter (Signed)
Pt should increase lantus dose by 1 unit every 3 days until fasting glucose is below 150mg /dl. Stay at that dose until follow up. Decrease dose if fasting glucose is below 90mg /dl.

## 2015-11-02 NOTE — Telephone Encounter (Signed)
Patient called stating her blood sugars has been in the 200's +.  She stopped her Novolog 10/01/15 and increased Lantus 10 Units in the evenings.  She is taking her blood sugars 3 times daily as ordered.  Patient broke her wrist and is having surgery 11/04/15.  Reviewed last office note from 10/01/15; patient needed to make a follow up visit in 4 weeks from that visit.  At that time patient missed appointment due to breaking her.  Appointment made for 11/18/15 with PCP.  Please advise.  Derl Barrow, RN

## 2015-11-02 NOTE — Telephone Encounter (Signed)
Informed pt. She repeated the instructions and will call if she has questions. Ottis Stain, CMA

## 2015-11-03 ENCOUNTER — Other Ambulatory Visit: Payer: Self-pay | Admitting: Orthopedic Surgery

## 2015-11-04 ENCOUNTER — Ambulatory Visit (HOSPITAL_BASED_OUTPATIENT_CLINIC_OR_DEPARTMENT_OTHER)
Admission: RE | Admit: 2015-11-04 | Discharge: 2015-11-04 | Disposition: A | Discharge status: Home / Self Care | Payer: Worker's Compensation | Source: Ambulatory Visit | Attending: Orthopedic Surgery | Admitting: Orthopedic Surgery

## 2015-11-04 ENCOUNTER — Encounter (HOSPITAL_BASED_OUTPATIENT_CLINIC_OR_DEPARTMENT_OTHER): Payer: Self-pay | Admitting: Orthopedic Surgery

## 2015-11-04 ENCOUNTER — Ambulatory Visit (HOSPITAL_BASED_OUTPATIENT_CLINIC_OR_DEPARTMENT_OTHER): Payer: Worker's Compensation | Admitting: Anesthesiology

## 2015-11-04 ENCOUNTER — Encounter (HOSPITAL_BASED_OUTPATIENT_CLINIC_OR_DEPARTMENT_OTHER)
Admission: RE | Disposition: A | Discharge status: Home / Self Care | Payer: Self-pay | Source: Ambulatory Visit | Attending: Orthopedic Surgery

## 2015-11-04 DIAGNOSIS — W19XXXA Unspecified fall, initial encounter: Secondary | ICD-10-CM | POA: Insufficient documentation

## 2015-11-04 DIAGNOSIS — S52572A Other intraarticular fracture of lower end of left radius, initial encounter for closed fracture: Secondary | ICD-10-CM | POA: Insufficient documentation

## 2015-11-04 DIAGNOSIS — E785 Hyperlipidemia, unspecified: Secondary | ICD-10-CM | POA: Insufficient documentation

## 2015-11-04 DIAGNOSIS — H9190 Unspecified hearing loss, unspecified ear: Secondary | ICD-10-CM | POA: Diagnosis not present

## 2015-11-04 DIAGNOSIS — G5602 Carpal tunnel syndrome, left upper limb: Secondary | ICD-10-CM | POA: Insufficient documentation

## 2015-11-04 DIAGNOSIS — E1140 Type 2 diabetes mellitus with diabetic neuropathy, unspecified: Secondary | ICD-10-CM | POA: Insufficient documentation

## 2015-11-04 DIAGNOSIS — I10 Essential (primary) hypertension: Secondary | ICD-10-CM | POA: Diagnosis not present

## 2015-11-04 DIAGNOSIS — S52502A Unspecified fracture of the lower end of left radius, initial encounter for closed fracture: Secondary | ICD-10-CM | POA: Diagnosis present

## 2015-11-04 HISTORY — PX: OPEN REDUCTION INTERNAL FIXATION (ORIF) DISTAL RADIAL FRACTURE: SHX5989

## 2015-11-04 HISTORY — PX: CARPAL TUNNEL RELEASE: SHX101

## 2015-11-04 LAB — POCT I-STAT, CHEM 8
BUN: 16 mg/dL (ref 6–20)
Calcium, Ion: 1.22 mmol/L (ref 1.12–1.23)
Chloride: 94 mmol/L — ABNORMAL LOW (ref 101–111)
Creatinine, Ser: 0.6 mg/dL (ref 0.44–1.00)
Glucose, Bld: 282 mg/dL — ABNORMAL HIGH (ref 65–99)
HCT: 41 % (ref 36.0–46.0)
Hemoglobin: 13.9 g/dL (ref 12.0–15.0)
Potassium: 3.7 mmol/L (ref 3.5–5.1)
Sodium: 136 mmol/L (ref 135–145)
TCO2: 30 mmol/L (ref 0–100)

## 2015-11-04 LAB — GLUCOSE, CAPILLARY: Glucose-Capillary: 295 mg/dL — ABNORMAL HIGH (ref 65–99)

## 2015-11-04 SURGERY — OPEN REDUCTION INTERNAL FIXATION (ORIF) DISTAL RADIUS FRACTURE
Anesthesia: General | Site: Wrist | Laterality: Left

## 2015-11-04 MED ORDER — DEXAMETHASONE SODIUM PHOSPHATE 10 MG/ML IJ SOLN
INTRAMUSCULAR | Status: AC
  Filled 2015-11-04: qty 1

## 2015-11-04 MED ORDER — PROPOFOL 10 MG/ML IV BOLUS
INTRAVENOUS | Status: DC | PRN
  Administered 2015-11-04: 200 mg via INTRAVENOUS

## 2015-11-04 MED ORDER — GLYCOPYRROLATE 0.2 MG/ML IJ SOLN: 0.2000 mg | Freq: Once | INTRAMUSCULAR | Status: DC | PRN

## 2015-11-04 MED ORDER — FENTANYL CITRATE (PF) 100 MCG/2ML IJ SOLN
50.0000 ug | INTRAMUSCULAR | Status: DC | PRN
  Administered 2015-11-04: 100 ug via INTRAVENOUS

## 2015-11-04 MED ORDER — CEFAZOLIN SODIUM-DEXTROSE 2-4 GM/100ML-% IV SOLN
INTRAVENOUS | Status: AC
  Filled 2015-11-04: qty 100

## 2015-11-04 MED ORDER — PHENYLEPHRINE 40 MCG/ML (10ML) SYRINGE FOR IV PUSH (FOR BLOOD PRESSURE SUPPORT)
PREFILLED_SYRINGE | INTRAVENOUS | Status: AC
  Filled 2015-11-04: qty 10

## 2015-11-04 MED ORDER — PROPOFOL 10 MG/ML IV BOLUS
INTRAVENOUS | Status: AC
  Filled 2015-11-04: qty 40

## 2015-11-04 MED ORDER — ONDANSETRON HCL 4 MG/2ML IJ SOLN
INTRAMUSCULAR | Status: AC
Start: 2015-11-04 — End: 2015-11-04
  Filled 2015-11-04: qty 2

## 2015-11-04 MED ORDER — OXYCODONE HCL 5 MG PO TABS: 5.0000 mg | ORAL_TABLET | Freq: Once | ORAL | Status: DC | PRN

## 2015-11-04 MED ORDER — CHLORHEXIDINE GLUCONATE 4 % EX LIQD: 60.0000 mL | Freq: Once | CUTANEOUS | Status: DC

## 2015-11-04 MED ORDER — OXYCODONE HCL 5 MG/5ML PO SOLN: 5.0000 mg | Freq: Once | ORAL | Status: DC | PRN

## 2015-11-04 MED ORDER — DEXAMETHASONE SODIUM PHOSPHATE 4 MG/ML IJ SOLN
INTRAMUSCULAR | Status: DC | PRN
  Administered 2015-11-04: 10 mg via INTRAVENOUS

## 2015-11-04 MED ORDER — LIDOCAINE 2% (20 MG/ML) 5 ML SYRINGE
INTRAMUSCULAR | Status: AC
Start: 2015-11-04 — End: 2015-11-04
  Filled 2015-11-04: qty 5

## 2015-11-04 MED ORDER — LACTATED RINGERS IV SOLN
INTRAVENOUS | Status: DC
  Administered 2015-11-04 (×3): via INTRAVENOUS

## 2015-11-04 MED ORDER — MIDAZOLAM HCL 2 MG/2ML IJ SOLN
INTRAMUSCULAR | Status: AC
  Filled 2015-11-04: qty 2

## 2015-11-04 MED ORDER — PHENYLEPHRINE HCL 10 MG/ML IJ SOLN
INTRAMUSCULAR | Status: DC | PRN
  Administered 2015-11-04: 80 ug via INTRAVENOUS

## 2015-11-04 MED ORDER — CEFAZOLIN SODIUM-DEXTROSE 2-4 GM/100ML-% IV SOLN
2.0000 g | INTRAVENOUS | Status: AC
  Administered 2015-11-04: 2 g via INTRAVENOUS

## 2015-11-04 MED ORDER — LIDOCAINE HCL (CARDIAC) 20 MG/ML IV SOLN
INTRAVENOUS | Status: DC | PRN
  Administered 2015-11-04: 50 mg via INTRAVENOUS

## 2015-11-04 MED ORDER — SCOPOLAMINE 1 MG/3DAYS TD PT72: 1.0000 | MEDICATED_PATCH | Freq: Once | TRANSDERMAL | Status: DC | PRN

## 2015-11-04 MED ORDER — MEPERIDINE HCL 25 MG/ML IJ SOLN: 6.2500 mg | INTRAMUSCULAR | Status: DC | PRN

## 2015-11-04 MED ORDER — FENTANYL CITRATE (PF) 100 MCG/2ML IJ SOLN
INTRAMUSCULAR | Status: AC
Start: 2015-11-04 — End: 2015-11-04
  Filled 2015-11-04: qty 2

## 2015-11-04 MED ORDER — HYDROMORPHONE HCL 1 MG/ML IJ SOLN: 0.2500 mg | INTRAMUSCULAR | Status: DC | PRN

## 2015-11-04 MED ORDER — BUPIVACAINE-EPINEPHRINE (PF) 0.5% -1:200000 IJ SOLN
INTRAMUSCULAR | Status: DC | PRN
  Administered 2015-11-04: 25 mL via PERINEURAL

## 2015-11-04 MED ORDER — MIDAZOLAM HCL 2 MG/2ML IJ SOLN
1.0000 mg | INTRAMUSCULAR | Status: DC | PRN
  Administered 2015-11-04: 2 mg via INTRAVENOUS

## 2015-11-04 MED ORDER — MIDAZOLAM HCL 5 MG/5ML IJ SOLN
INTRAMUSCULAR | Status: DC | PRN
  Administered 2015-11-04: 2 mg via INTRAVENOUS

## 2015-11-04 MED ORDER — OXYCODONE-ACETAMINOPHEN 7.5-325 MG PO TABS: 1.0000 | ORAL_TABLET | ORAL | Status: DC | PRN

## 2015-11-04 MED ORDER — ONDANSETRON HCL 4 MG/2ML IJ SOLN
INTRAMUSCULAR | Status: DC | PRN
Start: 2015-11-04 — End: 2015-11-04
  Administered 2015-11-04: 4 mg via INTRAVENOUS

## 2015-11-04 MED ORDER — FENTANYL CITRATE (PF) 100 MCG/2ML IJ SOLN
INTRAMUSCULAR | Status: DC | PRN
  Administered 2015-11-04: 100 ug via INTRAVENOUS

## 2015-11-04 SURGICAL SUPPLY — 63 items
BIT DRILL 2.0 LNG QUCK RELEASE (BIT) IMPLANT
BIT DRILL 2.8X5 QR DISP (BIT) ×2 IMPLANT
BLADE MINI RND TIP GREEN BEAV (BLADE) IMPLANT
BLADE SURG 15 STRL LF DISP TIS (BLADE) ×1 IMPLANT
BLADE SURG 15 STRL SS (BLADE) ×6
BNDG CMPR 9X4 STRL LF SNTH (GAUZE/BANDAGES/DRESSINGS) ×1
BNDG COHESIVE 3X5 TAN STRL LF (GAUZE/BANDAGES/DRESSINGS) ×3 IMPLANT
BNDG ESMARK 4X9 LF (GAUZE/BANDAGES/DRESSINGS) ×3 IMPLANT
BNDG GAUZE ELAST 4 BULKY (GAUZE/BANDAGES/DRESSINGS) ×3 IMPLANT
CHLORAPREP W/TINT 26ML (MISCELLANEOUS) ×3 IMPLANT
CORDS BIPOLAR (ELECTRODE) ×3 IMPLANT
COVER BACK TABLE 60X90IN (DRAPES) ×3 IMPLANT
COVER MAYO STAND STRL (DRAPES) ×3 IMPLANT
CUFF TOURNIQUET SINGLE 18IN (TOURNIQUET CUFF) ×2 IMPLANT
DECANTER SPIKE VIAL GLASS SM (MISCELLANEOUS) IMPLANT
DRAPE EXTREMITY T 121X128X90 (DRAPE) ×3 IMPLANT
DRAPE OEC MINIVIEW 54X84 (DRAPES) ×3 IMPLANT
DRAPE SURG 17X23 STRL (DRAPES) ×3 IMPLANT
DRILL 2.0 LNG QUICK RELEASE (BIT) ×3
GAUZE SPONGE 4X4 12PLY STRL (GAUZE/BANDAGES/DRESSINGS) ×3 IMPLANT
GAUZE XEROFORM 1X8 LF (GAUZE/BANDAGES/DRESSINGS) ×3 IMPLANT
GLOVE BIO SURGEON STRL SZ7.5 (GLOVE) ×2 IMPLANT
GLOVE BIOGEL PI IND STRL 7.0 (GLOVE) IMPLANT
GLOVE BIOGEL PI IND STRL 8 (GLOVE) IMPLANT
GLOVE BIOGEL PI IND STRL 8.5 (GLOVE) ×1 IMPLANT
GLOVE BIOGEL PI INDICATOR 7.0 (GLOVE) ×4
GLOVE BIOGEL PI INDICATOR 8 (GLOVE) ×2
GLOVE BIOGEL PI INDICATOR 8.5 (GLOVE) ×2
GLOVE ECLIPSE 6.5 STRL STRAW (GLOVE) ×2 IMPLANT
GLOVE SURG ORTHO 8.0 STRL STRW (GLOVE) ×3 IMPLANT
GOWN STRL REUS W/ TWL LRG LVL3 (GOWN DISPOSABLE) ×1 IMPLANT
GOWN STRL REUS W/TWL LRG LVL3 (GOWN DISPOSABLE) ×3
GOWN STRL REUS W/TWL XL LVL3 (GOWN DISPOSABLE) ×5 IMPLANT
GUIDEWIRE ORTHO 0.054X6 (WIRE) ×6 IMPLANT
NDL PRECISIONGLIDE 27X1.5 (NEEDLE) IMPLANT
NEEDLE PRECISIONGLIDE 27X1.5 (NEEDLE) IMPLANT
NS IRRIG 1000ML POUR BTL (IV SOLUTION) ×3 IMPLANT
PACK BASIN DAY SURGERY FS (CUSTOM PROCEDURE TRAY) ×3 IMPLANT
PAD CAST 3X4 CTTN HI CHSV (CAST SUPPLIES) ×1 IMPLANT
PADDING CAST COTTON 3X4 STRL (CAST SUPPLIES) ×3
PLATE PROX NARROW LEFT (Plate) ×2 IMPLANT
SCREW ACTK 2 NL HEX 3.5.11 (Screw) ×2 IMPLANT
SCREW BN FT 16X2.3XLCK HEX CRT (Screw) IMPLANT
SCREW CORT FT 18X2.3XLCK HEX (Screw) IMPLANT
SCREW CORTICAL LOCKING 2.3X16M (Screw) ×12 IMPLANT
SCREW CORTICAL LOCKING 2.3X18M (Screw) ×6 IMPLANT
SCREW FX16X2.3XLCK SMTH NS CRT (Screw) IMPLANT
SCREW FX18X2.3XSMTH LCK NS CRT (Screw) IMPLANT
SCREW NLCKG 13 3.5X13 HEXA (Screw) IMPLANT
SCREW NON-LOCK 3.5X13 (Screw) ×3 IMPLANT
SCREW NONLOCK HEX 3.5X12 (Screw) ×2 IMPLANT
SLEEVE SCD COMPRESS KNEE MED (MISCELLANEOUS) ×3 IMPLANT
SLING ARM FOAM STRAP LRG (SOFTGOODS) ×2 IMPLANT
SPLINT PLASTER CAST XFAST 3X15 (CAST SUPPLIES) IMPLANT
SPLINT PLASTER XTRA FASTSET 3X (CAST SUPPLIES) ×14
STOCKINETTE 4X48 STRL (DRAPES) ×3 IMPLANT
SUT ETHILON 4 0 PS 2 18 (SUTURE) ×5 IMPLANT
SUT VICRYL 4-0 PS2 18IN ABS (SUTURE) ×3 IMPLANT
SYR BULB 3OZ (MISCELLANEOUS) ×3 IMPLANT
SYR CONTROL 10ML LL (SYRINGE) IMPLANT
TOWEL OR 17X24 6PK STRL BLUE (TOWEL DISPOSABLE) ×3 IMPLANT
TOWEL OR NON WOVEN STRL DISP B (DISPOSABLE) ×2 IMPLANT
UNDERPAD 30X30 (UNDERPADS AND DIAPERS) ×1 IMPLANT

## 2015-11-04 NOTE — Progress Notes (Signed)
Assisted Dr. Al Corpus with left, ultrasound guided, infraclavicular block. Interpreter Producer, television/film/video) present throughout pre op interview and block procedure Side rails up, monitors on throughout procedure. See vital signs in flow sheet. Tolerated Procedure well.

## 2015-11-04 NOTE — Discharge Instructions (Addendum)
[facility] Instructions °Hand Surgery ° °Wound Care: °Keep your hand elevated above the level of your heart.  Do not allow it to dangle by your side.  Keep the dressing dry and do not remove it unless your doctor advises you to do so.  He will usually change it at the time of your post-op visit.  Moving your fingers is advised to stimulate circulation but will depend on the site of your surgery.  If you have a splint applied, your doctor will advise you regarding movement. ° °Activity: °Do not drive or operate machinery today.  Rest today and then you may return to your normal activity and work as indicated by your physician. ° °Diet:  °Drink liquids today or eat a light diet.  You may resume a regular diet tomorrow.   ° °General expectations: °Pain for two to three days. °Fingers may become slightly swollen. ° °Call your doctor if any of the following occur: °Severe pain not relieved by pain medication. °Elevated temperature. °Dressing soaked with blood. °Inability to move fingers. °White or bluish color to fingers. ° ° ° °Post Anesthesia Home Care Instructions ° °Activity: °Get plenty of rest for the remainder of the day. A responsible adult should stay with you for 24 hours following the procedure.  °For the next 24 hours, DO NOT: °-Drive a car °-Operate machinery °-Drink alcoholic beverages °-Take any medication unless instructed by your physician °-Make any legal decisions or sign important papers. ° °Meals: °Start with liquid foods such as gelatin or soup. Progress to regular foods as tolerated. Avoid greasy, spicy, heavy foods. If nausea and/or vomiting occur, drink only clear liquids until the nausea and/or vomiting subsides. Call your physician if vomiting continues. ° °Special Instructions/Symptoms: °Your throat may feel dry or sore from the anesthesia or the breathing tube placed in your throat during surgery. If this causes discomfort, gargle with warm salt water. The discomfort should disappear within  24 hours. ° °If you had a scopolamine patch placed behind your ear for the management of post- operative nausea and/or vomiting: ° °1. The medication in the patch is effective for 72 hours, after which it should be removed.  Wrap patch in a tissue and discard in the trash. Wash hands thoroughly with soap and water. °2. You may remove the patch earlier than 72 hours if you experience unpleasant side effects which may include dry mouth, dizziness or visual disturbances. °3. Avoid touching the patch. Wash your hands with soap and water after contact with the patch. °  ° °Regional Anesthesia Blocks ° °1. Numbness or the inability to move the "blocked" extremity may last from 3-48 hours after placement. The length of time depends on the medication injected and your individual response to the medication. If the numbness is not going away after 48 hours, call your surgeon. ° °2. The extremity that is blocked will need to be protected until the numbness is gone and the  Strength has returned. Because you cannot feel it, you will need to take extra care to avoid injury. Because it may be weak, you may have difficulty moving it or using it. You may not know what position it is in without looking at it while the block is in effect. ° °3. For blocks in the legs and feet, returning to weight bearing and walking needs to be done carefully. You will need to wait until the numbness is entirely gone and the strength has returned. You should be able to move your   normally before you try and bear weight or walk. You will need someone to be with you when you first try to ensure you do not fall and possibly risk injury. ° °4. Bruising and tenderness at the needle site are common side effects and will resolve in a few days. ° °5. Persistent numbness or new problems with movement should be communicated to the surgeon or the [facility] ([phone])/ [facility] ([phone]).Call your surgeon if you  experience:  ° °1.  Fever over 101.0. °2.  Inability to urinate. °3.  Nausea and/or vomiting. °4.  Extreme swelling or bruising at the surgical site. °5.  Continued bleeding from the incision. °6.  Increased pain, redness or drainage from the incision. °7.  Problems related to your pain medication. °8.  Any problems and/or concerns ° °

## 2015-11-04 NOTE — Op Note (Addendum)
Dictation Number [id number]  Intra-operative fluoroscopic images in the AP, lateral, and oblique views were taken and evaluated by myself.  Reduction and hardware placement were confirmed.  There was no intraarticular penetration of permanent hardware.

## 2015-11-04 NOTE — Addendum Note (Signed)
Addendum  created 11/04/15 1056 by Lorrene Reid, MD   Modules edited: Anesthesia Medication Administration

## 2015-11-04 NOTE — Op Note (Signed)
I assisted Surgeon(s) and Role:    * Daryll Brod, MD - Primary    * Leanora Cover, MD - Assisting on the Procedure(s): OPEN REDUCTION INTERNAL FIXATION (ORIF) LEFT DISTAL RADIAL FRACTURE POSSIBLE BONE GRAFT CARPAL TUNNEL RELEASE on 11/04/2015.  I provided assistance on this case as follows: retraction of soft tissues, maintaining reduction of fracture during fixation.  Electronically signed by: Tennis Must, MD Date: 11/04/2015 Time: 10:01 AM

## 2015-11-04 NOTE — Brief Op Note (Signed)
11/04/2015  9:52 AM  PATIENT:  Alicia [name]  60 y.o. female  PRE-OPERATIVE DIAGNOSIS:  distal radius fracture left, left carpal tunnel syndrome  POST-OPERATIVE DIAGNOSIS:  distal radius fracture left, left carpal tunnel syndrome  PROCEDURE:  Procedure(s): OPEN REDUCTION INTERNAL FIXATION (ORIF) LEFT DISTAL RADIAL FRACTURE POSSIBLE BONE GRAFT (Left) CARPAL TUNNEL RELEASE (Left)  SURGEON:  Surgeon(s) and Role:    * Daryll Brod, MD - Primary    * Leanora Cover, MD - Assisting  PHYSICIAN ASSISTANT:   ASSISTANTS: K Jimi Schappert,MD   ANESTHESIA:   regional and general  EBL:  Total I/O In: 1000 [I.V.:1000] Out: 2 [Blood:2]  BLOOD ADMINISTERED:none  DRAINS: none   LOCAL MEDICATIONS USED:  NONE  SPECIMEN:  No Specimen  DISPOSITION OF SPECIMEN:  N/A  COUNTS:  YES  TOURNIQUET:   Total Tourniquet Time Documented: Upper Arm (Left) - 54 minutes Total: Upper Arm (Left) - 54 minutes   DICTATION: .Other Dictation: Dictation Number [id number]  PLAN OF CARE: Discharge to home after PACU  PATIENT DISPOSITION:  PACU - hemodynamically stable.

## 2015-11-04 NOTE — Anesthesia Postprocedure Evaluation (Signed)
Anesthesia Post Note  Patient: Alicia [name]  Procedure(s) Performed: Procedure(s) (LRB): OPEN REDUCTION INTERNAL FIXATION (ORIF) LEFT DISTAL RADIAL FRACTURE POSSIBLE BONE GRAFT (Left) CARPAL TUNNEL RELEASE (Left)  Patient location during evaluation: PACU Anesthesia Type: General Level of consciousness: awake and alert Pain management: pain level controlled Vital Signs Assessment: post-procedure vital signs reviewed and stable Respiratory status: spontaneous breathing, nonlabored ventilation and respiratory function stable Cardiovascular status: blood pressure returned to baseline and stable Postop Assessment: no signs of nausea or vomiting Anesthetic complications: no    Last Vitals:  Filed Vitals:   11/04/15 1015 11/04/15 1025  BP: 130/67   Pulse: 91 87  Temp:    Resp: 0 0    Last Pain: There were no vitals filed for this visit.               Angeleena Dueitt A

## 2015-11-04 NOTE — Transfer of Care (Signed)
Immediate Anesthesia Transfer of Care Note  Patient: Alicia [name]  Procedure(s) Performed: Procedure(s): OPEN REDUCTION INTERNAL FIXATION (ORIF) LEFT DISTAL RADIAL FRACTURE POSSIBLE BONE GRAFT (Left) CARPAL TUNNEL RELEASE (Left)  Patient Location: PACU  Anesthesia Type:GA combined with regional for post-op pain  Level of Consciousness: awake and patient cooperative  Airway & Oxygen Therapy: Patient Spontanous Breathing and Patient connected to face mask oxygen  Post-op Assessment: Report given to RN and Post -op Vital signs reviewed and stable  Post vital signs: Reviewed and stable  Last Vitals:  Filed Vitals:   11/04/15 0816 11/04/15 0954  BP:  136/67  Pulse: 102 93  Temp:    Resp: 15 20    Last Pain: There were no vitals filed for this visit.       Complications: No apparent anesthesia complications

## 2015-11-04 NOTE — Anesthesia Procedure Notes (Addendum)
Procedure Name: LMA Insertion Date/Time: 11/04/2015 8:40 AM Performed by: Toula Moos L Pre-anesthesia Checklist: Patient identified, Emergency Drugs available, Suction available, Patient being monitored and Timeout performed Patient Re-evaluated:Patient Re-evaluated prior to inductionOxygen Delivery Method: Circle System Utilized Preoxygenation: Pre-oxygenation with 100% oxygen Intubation Type: IV induction Ventilation: Mask ventilation without difficulty LMA: LMA inserted LMA Size: 4.0 Number of attempts: 1 Airway Equipment and Method: Bite block Placement Confirmation: positive ETCO2 Tube secured with: Tape Dental Injury: Teeth and Oropharynx as per pre-operative assessment    Anesthesia Regional Block:  Infraclavicular brachial plexus block  Pre-Anesthetic Checklist: ,, timeout performed, Correct Patient, Correct Site, Correct Laterality, Correct Procedure, Correct Position, site marked, Risks and benefits discussed,  Surgical consent,  Pre-op evaluation,  At surgeon's request and post-op pain management  Laterality: Left and Upper  Prep: chloraprep       Needles:  Injection technique: Single-shot  Needle Type: Echogenic Stimulator Needle     Needle Length: 5cm 5 cm Needle Gauge: 21 and 21 G    Additional Needles:  Procedures: ultrasound guided (picture in chart) Infraclavicular brachial plexus block Narrative:  Start time: 11/04/2015 8:07 AM End time: 11/04/2015 8:12 AM Injection made incrementally with aspirations every 5 mL.  Performed by: Personally  Anesthesiologist: Abdelrahman Nair     Left Infraclavicular block image

## 2015-11-04 NOTE — Op Note (Signed)
NAMEToy [name].:  [id number]  MEDICAL RECORD NO.:  [id number]  LOCATION: [facility]                                FACILITY:  PHYSICIAN:  Daryll Brod, M.D.            DATE OF BIRTH: ASSISTANT: Richardo Priest, MD DATE OF PROCEDURE:  11/04/2015 DATE OF DISCHARGE:                              OPERATIVE REPORT   PREOPERATIVE DIAGNOSIS:  Distal radius fracture, left wrist, four-part plus pre-existing carpal tunnel syndrome.  Nerve conduction is positive prior to the fracture.  POSTOPERATIVE DIAGNOSIS:  Distal radius fracture, left wrist, four-part plus pre-existing carpal tunnel syndrome.  Nerve conduction is positive prior to the fracture.  OPERATION:  Open reduction and internal fixation, left distal radius fracture with carpal tunnel release, left hand.  SURGEON:  Daryll Brod, M.D.  ASSISTANT:  Leanora Cover, M.D.  ANESTHESIA:  Supraclavicular block, general.  DATE OF OPERATION:  May 4th, 2017.  ANESTHESIOLOGIST:  Lorrene Reid, M.D.  HISTORY:  The patient is a 60 year old female with a history of bilateral carpal tunnel syndrome, nerve conduction is positive, cubital tunnel syndrome on the right side.  She has undergone release of the cubital tunnel carpal tunnel on her right.  She suffered a fall suffering a distal radius fracture on her left side.  She had a pre- existing carpal tunnel syndrome with motor delays greater than 5.  This had not been treated surgically.  She had a comminuted intra-articular fracture of her left distal radius.  CT scan confirmed also metaphyseal extension.  She is admitted for open reduction and internal fixation of the left distal radius fracture and carpal tunnel release, left hand. She is aware of risks and complications including infection; recurrence of injury to arteries, nerves, tendons; incomplete relief of symptoms; dystrophy.  In the preoperative area, the patient is seen.  The extremity was marked by  both the patient and surgeon.  Communication was through an interpreter.  PROCEDURE IN DETAIL:  The patient was given a supraclavicular block in preoperative area under the direction of Dr. Al Corpus, brought to the operating room where a general anesthetic was carried out under his direction also.  She was prepped using ChloraPrep, supine position, left arm free.  A 3-minute dry time was allowed.  A time-out taken confirming the patient and procedure.  The limb was exsanguinated with an Esmarch bandage.  Tourniquet placed high on the arm was inflated to 250 mmHg. An incision was then made over the left flexor carpi radialis tendon, carried in a zigzag manner to the radial side towards styloid back over the FCR tendon, carried down through subcutaneous tissue.  Bleeders were electrocauterized with bipolar.  The dissection was carried through the carpi radialis tendon sheath.  This was then swept ulnarly.  The dissection was carried deeply identifying the pronator quadratus.  This was incised on its radial aspect.  The brachioradialis tendon was then identified inserting onto the styloid, and this was obliquely transected protecting the first dorsal extensor compartment tendons.  The pronator quadratus was then dissected off from the distal  radius, proximal and distal fragment.  There was a comminution of the distal fragment along with the visibility of the fracture fragment going into the metaphysis. This had a four-part fracture.  The clamps were then placed, and the fracture was then opened and reduced.  This was then cleared of soft tissue.  The fracture reduced in position, and a volar Acumed DVR plate was then approximated.  This was narrow in size.  This was affixed with K-wires.  X-rays in AP and lateral direction revealed that it was slightly ulnarly deviated.  This was readjusted, repinned.  X-rays confirmed good position of the plate.  This was then fixed with the gliding screw.   This measured 12 mm.  The distal screws were then placed.  These measured 16 and 18 mm with pegs on the ulnar side and locking screws on the radial side.  This firmly fixed the fracture in position.  The pins were removed.  The proximal screws were then placed. These measured 13 and 11 mm in the distal, more proximal screws.  This firmly fixed the fracture in position.  The area was irrigated with saline.  A separate incision was then made for release of the median nerve.  This was longitudinal in the mid palm, carried down through subcutaneous tissue.  Bleeders again electrocauterized with bipolar. The palmar fascia was split.  Superficial palmar arch was identified. The flexor tendon of the ring and little finger identified, and a retractor was placed leaving the flexor tendons and median nerve radially, retractor placed protecting the ulnar nerve ulnarly.  The flexor retinaculum was then incised on its ulnar border with sharp dissection.  A right-angle and Sewall retractor were placed between skin and forearm fascia, and the forearm fascia released proximally for approximately 3 cm under direct vision.  The canal was explored.  Area compression to the nerve was apparent.  Motor branch entered into muscle distally.  Each of the wounds was then copiously irrigated with saline. The skin on the carpal tunnel release was closed with interrupted 4-0 nylon sutures.  The pronator quadratus was repaired with figure-of-eight 4-0 Vicryl sutures, the subcutaneous tissue with interrupted 4-0 Vicryl, the skin with interrupted 4-0 nylon sutures.  X-rays confirmed position of the plate in AP, lateral, and oblique directions with a 20 degree tilt, to be certain that there were was no intra-articular penetration of the screws.  The dressing was applied.  This was a compressive dressing with volar splint.  On deflation of the tourniquet, all fingers immediately pinked.  She was taken to the recovery room  for observation in satisfactory condition.  She will be discharged home to return to [place] in one week on oxycodone.  It should be noted that the assistance throughout the entire case was done by Dr. Leanora Cover.          ______________________________ Daryll Brod, M.D.     GK/MEDQ  D:  11/04/2015  T:  11/04/2015  Job:  [id number]

## 2015-11-04 NOTE — Anesthesia Preprocedure Evaluation (Signed)
Anesthesia Evaluation  Patient identified by MRN, date of birth, ID band Patient awake    Reviewed: Allergy & Precautions, NPO status , Patient's Chart, lab work & pertinent test results  Airway Mallampati: I  TM Distance: >3 FB Neck ROM: Full    Dental  (+) Teeth Intact, Dental Advisory Given   Pulmonary    breath sounds clear to auscultation       Cardiovascular hypertension, Pt. on medications  Rhythm:Regular Rate:Normal     Neuro/Psych Seizures -, Well Controlled,     GI/Hepatic GERD  Medicated and Controlled,  Endo/Other  diabetes, Well Controlled, Type 2, Insulin DependentMorbid obesity  Renal/GU      Musculoskeletal   Abdominal   Peds  Hematology   Anesthesia Other Findings   Reproductive/Obstetrics                             Anesthesia Physical Anesthesia Plan  ASA: II  Anesthesia Plan: General   Post-op Pain Management: GA combined w/ Regional for post-op pain   Induction: Intravenous  Airway Management Planned: LMA  Additional Equipment:   Intra-op Plan:   Post-operative Plan: Extubation in OR  Informed Consent: I have reviewed the patients History and Physical, chart, labs and discussed the procedure including the risks, benefits and alternatives for the proposed anesthesia with the patient or authorized representative who has indicated his/her understanding and acceptance.   Dental advisory given  Plan Discussed with: CRNA, Anesthesiologist and Surgeon  Anesthesia Plan Comments:         Anesthesia Quick Evaluation

## 2015-11-04 NOTE — H&P (Signed)
Alicia [name] is an 60 y.o. female.   Chief Complaint: pain left wrist  HPI: Alicia [name] is a 60yo female who fell on her outstretched left hand. She complains of pain and deformity. She was seen in an urgent care center where r-rays revealed a distal radius fracture. A CT scan was performed confirming metaphyseal extension of the fracture.  She is diabetic and deaf. She has undergone carpal tunnel and cubital tunnel release on the right in the past.She has carpal tunnel syndrome on the left side with motor delay of 5.7, sensory delay 3.3, amplitude diminution 50.   Past Medical History  Diagnosis Date  . Diabetes mellitus   . Hypertension   . Hyperlipidemia   . Gastroparesis   . Diabetic neuropathy ([place])   . GERD (gastroesophageal reflux disease)   . Complete deafness     Meningitis at age 51  . S/P appy   . Deaf   . H/O: C-section   . Ganglion cyst 06/22/2011  . Neuromuscular disorder ([place])     diabetic neuropathy  . Anemia     Past Surgical History  Procedure Laterality Date  . Appendectomy    . Tubal ligation    . Cesarean section    . Uterine fibroid embolization  2007  . Cardiolyte ef 77%, no ischemia in 2006  2006  . Wrist surgery      Cyst removed on left  . Wrist surgery Right 1985    tendon repair R wrist  . Carpal tunnel release Right 04/20/2015    Procedure: RIGHT CARPAL TUNNEL RELEASE;  Surgeon: Daryll Brod, MD;  Location: [place];  Service: Orthopedics;  Laterality: Right;  . Ulnar nerve transposition Right 04/20/2015    Procedure: RIGHT ULNAR NERVE DECOMPRESSION;  Surgeon: Daryll Brod, MD;  Location: [place];  Service: Orthopedics;  Laterality: Right;  . Trigger finger release Right 04/20/2015    Procedure: RELEASE TRIGGER FINGER/A-1 PULLEY RIGHT MIDDLE FINGER,RIGHT RING FINGER;  Surgeon: Daryll Brod, MD;  Location: [place];  Service: Orthopedics;  Laterality: Right;    Family History  Problem Relation Age of  Onset  . Hypertension Mother   . Cancer Maternal Aunt   . Cancer Maternal Grandmother   . Diabetes Father    Social History:  reports that she has never smoked. She has never used smokeless tobacco. She reports that she drinks alcohol. She reports that she does not use illicit drugs.  Allergies:  Allergies  Allergen Reactions  . Sulfonamide Derivatives Swelling and Rash    REACTION: rash, swelling - "Lost BABY" - Terrible itching.   . Lipitor [Atorvastatin Calcium] Other (See Comments)    Muscle Aches - Mild-Moderate - completely resolved with D/C of atorva.   . Ramipril Other (See Comments)    REACTION: cough    No prescriptions prior to admission    No results found for this or any previous visit (from the past 48 hour(s)).  No results found.   Pertinent items are noted in HPI.  Height 5\' 4"  (1.626 m), weight 79.833 kg (176 lb).  General appearance: alert, cooperative and appears stated age Head: Normocephalic, without obvious abnormality Neck: no JVD Resp: clear to auscultation bilaterally Cardio: regular rate and rhythm, S1, S2 normal, no murmur, click, rub or gallop GI: soft, non-tender; bowel sounds normal; no masses,  no organomegaly Extremities: pain left wrist Pulses: 2+ and symmetric Skin: Skin color, texture, turgor normal. No rashes or lesions Neurologic: Grossly  normal Incision/Wound: na  Assessment/Plan DX: distal radius fracture left carpal tunnel syndrome  Plan: ORIF left distal radius and carpal tunnel release  Pre, peri and postoperative course were discussed along with the risks and complications.  The patient is aware there is no guarantee with the surgery, possibility of infection, recurrence, injury to arteries, nerves, tendons, incomplete relief of symptoms and dystrophy.   Lando Alcalde R 11/04/2015, 6:18 AM

## 2015-11-07 ENCOUNTER — Encounter (HOSPITAL_BASED_OUTPATIENT_CLINIC_OR_DEPARTMENT_OTHER): Payer: Self-pay | Admitting: Orthopedic Surgery

## 2015-11-12 ENCOUNTER — Other Ambulatory Visit: Payer: Self-pay | Admitting: Family Medicine

## 2015-11-15 ENCOUNTER — Telehealth: Payer: Self-pay | Admitting: Family Medicine

## 2015-11-15 NOTE — Telephone Encounter (Signed)
[employer] is asking patient about her blood sugar levels.  Need to speak with nurse regarding wrist.

## 2015-11-18 ENCOUNTER — Ambulatory Visit (INDEPENDENT_AMBULATORY_CARE_PROVIDER_SITE_OTHER): Payer: Medicare Other | Admitting: Family Medicine

## 2015-11-18 ENCOUNTER — Encounter: Payer: Self-pay | Admitting: Family Medicine

## 2015-11-18 VITALS — BP 138/70 | HR 81 | Temp 97.7°F | Ht 64.0 in | Wt 171.7 lb

## 2015-11-18 DIAGNOSIS — IMO0001 Reserved for inherently not codable concepts without codable children: Secondary | ICD-10-CM

## 2015-11-18 DIAGNOSIS — Z794 Long term (current) use of insulin: Secondary | ICD-10-CM | POA: Diagnosis not present

## 2015-11-18 DIAGNOSIS — I10 Essential (primary) hypertension: Secondary | ICD-10-CM

## 2015-11-18 DIAGNOSIS — E119 Type 2 diabetes mellitus without complications: Secondary | ICD-10-CM | POA: Diagnosis not present

## 2015-11-18 NOTE — Progress Notes (Signed)
Subjective: Alicia [name] is a 60 y.o. female here for diabetes follow up. Alicia [name], of [place], provides ASL interpretation for this visit.   The plan at the last visit was to stop novolog and slowly titrate up on lantus alone, starting at 10 units daily. She reports uncontrolled CBGs as low as 120 fasting and as high as 400 postprandial, but has only increased lantus to 11 units qHS. She eats erratically. Recently suffered [place] injury resulting into left wrist fracture which she noticed has coincided with increased CBGs.   - ROS: Denies fever, chills, weight loss, dizziness, vision changes, syncope, polyuria, nocturia, numbness, polydipsia, chest pain, new wounds.  - [place]: Divorced, nonsmoker, no EtOH - Medications: reviewed and updated  Objective: BP 138/70 mmHg  Pulse 81  Temp(Src) 97.7 F (36.5 C) (Oral)  Ht 5\' 4"  (1.626 m)  Wt 171 lb 11.2 oz (77.883 kg)  BMI 29.46 kg/m2 Gen: Pleasant 60 y.o.female in no distress Pulm: Non-labored; CTAB, no wheezes  CV: Regular rate, no murmur appreciated; no LE edema, no JVD GI: Normoactive BS; soft, non-tender, non-distended, no HSM Skin: No wounds or rashes.  MSK: Left wrist in splint See diabetic foot exam - simple  Neuro: CN II-XII without deficits, sensation intact to light touch, steady gait.  Assessment & Plan: Alicia [name] is a 60 y.o. female here for diabetes, poorly controlled.    See problem list for plan.

## 2015-11-18 NOTE — Patient Instructions (Signed)
Thank you for coming in today!  Once again, so sorry to hear about your wrist.   - Start taking lantus 15 units every evening.  - To better control your fasting blood sugar, increase your lantus dose by 1 unit every 3 days as long as the average morning blood sugars (fasting) remain above 150mg /dl. If you are having low blood sugars in the morning, go back to the previous dose.  - Follow up in 1 month with a record of your morning blood sugars and the dose of lantus you give every night.   Our clinic's number is [phone]. Feel free to call any time with questions or concerns. We will answer any questions after hours with our 24-hour emergency line at that number as well.   - Dr. Bonner Puna

## 2015-11-18 NOTE — Assessment & Plan Note (Addendum)
Uncontrolled per log today, likely worsened by recent trauma with bone fracture. Last Hb A1c 3/31 was 8.5, anticipate next check ~6/31, but will continue to titrated upward on insulin. Will increase lantus to 15 units qHS and instructed to increase by 1 unit every 3 nights if average AM CBG is > 150mg /dl. Will target fasting CBGs then probably will need single novolog with largest meal to further bring down A1c.  Follow up in 1 month with CBG records.

## 2015-11-18 NOTE — Assessment & Plan Note (Signed)
>>  ASSESSMENT AND PLAN FOR DIABETES (HCC) WRITTEN ON 11/19/2015  3:52 PM BY GRUNZ, RYAN B, MD  Uncontrolled per log today, likely worsened by recent trauma with bone fracture. Last Hb A1c 3/31 was 8.5, anticipate next check ~6/31, but will continue to titrated upward on insulin . Will increase lantus  to 15 units qHS and instructed to increase by 1 unit every 3 nights if average AM CBG is > 150mg /dl. Will target fasting CBGs then probably will need single novolog  with largest meal to further bring down A1c.  Follow up in 1 month with CBG records.

## 2015-11-18 NOTE — Assessment & Plan Note (Signed)
At goal, no symptoms, continue current therapy.

## 2015-11-19 NOTE — Telephone Encounter (Signed)
PT had appointment with PCP on 5/18. Katharina Caper, April D, [place]

## 2015-12-01 ENCOUNTER — Ambulatory Visit: Payer: Self-pay

## 2015-12-02 ENCOUNTER — Other Ambulatory Visit: Payer: Self-pay | Admitting: *Deleted

## 2015-12-02 ENCOUNTER — Ambulatory Visit
Admission: RE | Admit: 2015-12-02 | Discharge: 2015-12-02 | Disposition: A | Discharge status: Home / Self Care | Payer: Medicare Other | Source: Ambulatory Visit

## 2015-12-02 DIAGNOSIS — Z1231 Encounter for screening mammogram for malignant neoplasm of breast: Secondary | ICD-10-CM | POA: Diagnosis not present

## 2015-12-02 MED ORDER — GLUCOSE BLOOD VI STRP: ORAL_STRIP | Status: DC

## 2015-12-03 ENCOUNTER — Ambulatory Visit (INDEPENDENT_AMBULATORY_CARE_PROVIDER_SITE_OTHER): Payer: Medicare Other | Admitting: Ophthalmology

## 2015-12-11 ENCOUNTER — Other Ambulatory Visit: Payer: Self-pay | Admitting: Family Medicine

## 2015-12-21 ENCOUNTER — Ambulatory Visit (INDEPENDENT_AMBULATORY_CARE_PROVIDER_SITE_OTHER): Payer: Medicare Other | Admitting: Family Medicine

## 2015-12-21 ENCOUNTER — Encounter: Payer: Self-pay | Admitting: Family Medicine

## 2015-12-21 VITALS — BP 136/68 | HR 81 | Temp 97.8°F | Wt 167.0 lb

## 2015-12-21 DIAGNOSIS — M7150 Other bursitis, not elsewhere classified, unspecified site: Secondary | ICD-10-CM | POA: Diagnosis not present

## 2015-12-21 DIAGNOSIS — I10 Essential (primary) hypertension: Secondary | ICD-10-CM

## 2015-12-21 DIAGNOSIS — E118 Type 2 diabetes mellitus with unspecified complications: Secondary | ICD-10-CM | POA: Diagnosis not present

## 2015-12-21 DIAGNOSIS — M7050 Other bursitis of knee, unspecified knee: Secondary | ICD-10-CM | POA: Insufficient documentation

## 2015-12-21 DIAGNOSIS — Z794 Long term (current) use of insulin: Secondary | ICD-10-CM

## 2015-12-21 LAB — POCT GLYCOSYLATED HEMOGLOBIN (HGB A1C): Hemoglobin A1C: 10.2

## 2015-12-21 NOTE — Progress Notes (Signed)
Subjective: Alicia [name] is a 60 y.o. female here for diabetes follow up. Carlisle Cater, of [place], provides ASL interpretation for this visit.   Fasting CBGs range 200-250mg /dl. Has one at 70mg /dl and isn't sure why. She's not eating snacks and reports she didn't miss any meals prior to the 70mg /dl. She's taking 15 units of insulin every morning, and hasn't been increasing any above that. She reports uncontrolled CBGs as high as 550 without abdominal pain. She denies changing diet or decreasing exercise.   She reports left calf pain 3 - 4 times worst at night, for the past several weeks. No swelling, no redness. Helped by elevation. Also has pain below her left knee since walking more often lately. Nothing tried. No personal or family history of clots.   - ROS: Denies fever, chills, weight loss, dizziness, vision changes, syncope, numbness, chest pain, new wounds. + polyuria, + nocturia. Left wrist is healed well, still in splint. - PMFSH: Divorced, nonsmoker, no EtOH - Medications: reviewed and updated  Objective: BP 136/68 mmHg  Pulse 81  Temp(Src) 97.8 F (36.6 C) (Oral)  Wt 167 lb (75.751 kg)  SpO2 100% Gen: Pleasant 60 y.o.female in no distress Pulm: Non-labored; CTAB, no wheezes  CV: Regular rate, no murmur appreciated; no LE edema or erythema, no JVD. Negative homan's bilaterally.  GI: Normoactive BS; soft, non-tender, non-distended, no HSM Skin: No wounds or rashes.  MSK: Left wrist in splint with limited but improved ROM. Left knee without visible deformity or effusion. No patellar or joint line tenderness. + tenderness inferior to medial joint line. Full ROM.  See diabetic foot exam - simple  Neuro: CN II-XII without deficits, sensation intact to light touch, steady gait. Lab Results  Component Value Date   HGBA1C 10.2 12/21/2015   Assessment & Plan: TAVARIA SPERBECK is a 60 y.o. female here for diabetes, poorly controlled.    Type II diabetes mellitus with  complication Uncontrolled with significant hyperglycemia. Unsure why control worsened so much despite increasing insulin dose since last visit, but certainly requires more insulin. Will increase lantus insulin to 20 units daily and increase by 1 unit every 3 days until fasting CBGs remain below 150mg /dl. Plan is to start novolog at largest meal to target postprandial hyperglycemia as well, but did not have time to discuss this. Will have her seen in pharmacy clinic again.   Nocturnal leg cramping thought to be related to electrolyte imbalances/dehydration related to hyperglycemia. Strongly doubt DVT, cellulitis, or myositis. Will observe as diabetic control improves. May need to consider trial off QOD statin (h/o myopathy)  Essential HTN At goal. Continue current therapy.   Pes anserine bursitis Conservative management.

## 2015-12-21 NOTE — Assessment & Plan Note (Addendum)
Uncontrolled with significant hyperglycemia. Unsure why control worsened so much despite increasing insulin dose since last visit, but certainly requires more insulin. Will increase lantus insulin to 20 units daily and increase by 1 unit every 3 days until fasting CBGs remain below 150mg /dl. Plan is to start novolog at largest meal to target postprandial hyperglycemia as well, but did not have time to discuss this. Will have her seen in pharmacy clinic again.   Nocturnal leg cramping thought to be related to electrolyte imbalances/dehydration related to hyperglycemia. Strongly doubt DVT, cellulitis, or myositis. Will observe as diabetic control improves. May need to consider trial off QOD statin (h/o myopathy)

## 2015-12-21 NOTE — Assessment & Plan Note (Addendum)
Conservative management including judicious use of NSAIDs.

## 2015-12-21 NOTE — Patient Instructions (Addendum)
Increase morning insulin to 20 units, and ALSO add 1 more unit of insulin if you have an average of fasting blood sugar (in the morning) over 150mg /dl.  Your leg spasms are related to uncontrolled blood sugars and should get better with better control of your blood sugars.   The pain below and on the inside of the left knee is called pes anserine bursitis. It will go away if you can rest it. You can treat this with ibuprofen, rest and ice.

## 2015-12-21 NOTE — Assessment & Plan Note (Signed)
At goal.  Continue current therapy. 

## 2015-12-27 ENCOUNTER — Other Ambulatory Visit: Payer: Self-pay | Admitting: Family Medicine

## 2015-12-29 ENCOUNTER — Ambulatory Visit (INDEPENDENT_AMBULATORY_CARE_PROVIDER_SITE_OTHER): Payer: Medicare Other | Admitting: Ophthalmology

## 2016-01-07 ENCOUNTER — Ambulatory Visit (INDEPENDENT_AMBULATORY_CARE_PROVIDER_SITE_OTHER): Payer: Medicare Other | Admitting: Ophthalmology

## 2016-01-10 ENCOUNTER — Other Ambulatory Visit: Payer: Self-pay | Admitting: *Deleted

## 2016-01-10 DIAGNOSIS — E119 Type 2 diabetes mellitus without complications: Secondary | ICD-10-CM

## 2016-01-10 DIAGNOSIS — Z794 Long term (current) use of insulin: Principal | ICD-10-CM

## 2016-01-11 MED ORDER — INSULIN GLARGINE 100 UNIT/ML ~~LOC~~ SOLN: SUBCUTANEOUS | Status: DC

## 2016-01-21 ENCOUNTER — Ambulatory Visit (INDEPENDENT_AMBULATORY_CARE_PROVIDER_SITE_OTHER): Payer: Medicare Other | Admitting: Ophthalmology

## 2016-01-23 NOTE — Progress Notes (Signed)
Subjective:     Patient ID: Alicia [name], female   DOB: [birth year], 60 y.o.   MRN: [id number]  HPI Alicia [name] is a 60yo female presenting today for diabetes follow up. Visit conducted with aid of Sign Language interpretor. - Recent office visit on 12/21/15 with significant hyperglycemia. Lantus increased from 15units to 20units daily with instructions to titrate up to keep CBGs less than 150. Plan to initiate meal time Novolog but did not have time to discuss this. A1C at this visit 10.2. - Reports she is now taking Lantus 21units.  - Brought blood sugar logs showing morning fasting blood sugars of 71-232, pre-lunch blood sugars of 130-334, and pre-supper blood sugars of 217-548. Checks it 2-3 times per day, but does not check after a meal. - Stopped Novolog a few months ago when transitioned to Lantus. Amenable to restarting. - Never Smoker  Review of Systems Per HPI. Other systems negative.    Objective:   Physical Exam  Constitutional: She appears well-developed and well-nourished.  Cardiovascular: Normal rate and regular rhythm.   No murmur heard. Pulmonary/Chest: Effort normal. No respiratory distress. She has no wheezes.      Assessment and Plan:     Type II diabetes mellitus with complication - Transition Lantus from pm to am dosing. Continue 21units for now. - Restart Novolog 3units with lunch and supper - To follow up with Pharmacy after office visit - Will call in 1 week to adjust insulin - Follow up in 2 months to recheck A1C

## 2016-01-24 ENCOUNTER — Ambulatory Visit (INDEPENDENT_AMBULATORY_CARE_PROVIDER_SITE_OTHER): Payer: Medicare Other | Admitting: Pharmacist

## 2016-01-24 ENCOUNTER — Encounter: Payer: Self-pay | Admitting: Pharmacist

## 2016-01-24 ENCOUNTER — Encounter: Payer: Self-pay | Admitting: Family Medicine

## 2016-01-24 ENCOUNTER — Ambulatory Visit (INDEPENDENT_AMBULATORY_CARE_PROVIDER_SITE_OTHER): Payer: Medicare Other | Admitting: Family Medicine

## 2016-01-24 DIAGNOSIS — Z794 Long term (current) use of insulin: Secondary | ICD-10-CM

## 2016-01-24 DIAGNOSIS — IMO0001 Reserved for inherently not codable concepts without codable children: Secondary | ICD-10-CM

## 2016-01-24 DIAGNOSIS — E119 Type 2 diabetes mellitus without complications: Secondary | ICD-10-CM

## 2016-01-24 DIAGNOSIS — E118 Type 2 diabetes mellitus with unspecified complications: Secondary | ICD-10-CM

## 2016-01-24 MED ORDER — INSULIN ASPART 100 UNIT/ML ~~LOC~~ SOLN: SUBCUTANEOUS | 0 refills | Status: DC

## 2016-01-24 NOTE — Assessment & Plan Note (Signed)
-   Transition Lantus from pm to am dosing. Continue 21units for now. - Restart Novolog 3units with lunch and supper - To follow up with Pharmacy after office visit - Will call in 1 week to adjust insulin - Follow up in 2 months to recheck A1C

## 2016-01-24 NOTE — Patient Instructions (Signed)
Agree with plan by Dr. Gerlean Ren.    Sample provided.

## 2016-01-24 NOTE — Assessment & Plan Note (Signed)
Diabetes longstanding, currently with worsened control since stopping Novolog (prandial) insulin. Patient reports a few mild hypoglycemic events and is able to communicate appropriate hypoglycemia management plan. Patient reports adherence with medication. Control is suboptimal due to lack of prandial insulin and dosing schedule of lantus is suboptimal.   Agreed with plan by Dr. Gerlean Ren to change Lantus to the AM and restart mealtime prandial Novolog with two largest meals of the day.   Agree with plan for phone call follow up by Dr. Gerlean Ren in 1 week.   Consider alternative agents (GLP or SGLT2 in the future if glycemic control remains greater than goal.  Sample of Novolog #1 vial provided.

## 2016-01-24 NOTE — Assessment & Plan Note (Signed)
>>  ASSESSMENT AND PLAN FOR DIABETES (HCC) WRITTEN ON 01/24/2016 11:06 AM BY KOVAL, PETER G, RPH  Diabetes longstanding, currently with worsened control since stopping Novolog  (prandial) insulin . Patient reports a few mild hypoglycemic events and is able to communicate appropriate hypoglycemia management plan. Patient reports adherence with medication. Control is suboptimal due to lack of prandial insulin  and dosing schedule of lantus  is suboptimal.   Agreed with plan by Dr. Dyana to change Lantus  to the AM and restart mealtime prandial Novolog  with two largest meals of the day.   Agree with plan for phone call follow up by Dr. Dyana in 1 week.   Consider alternative agents (GLP or SGLT2 in the future if glycemic control remains greater than goal.  Sample of Novolog  #1 vial provided.

## 2016-01-24 NOTE — Progress Notes (Signed)
    S:    Patient arrives in good spirits, walking without assistance, accompanied by sign language interpreter, "Erica".  Presents for diabetes evaluation, education, and management at the request of Dr. Bonner Puna.  Patient was referred on Dr. Bonner Puna.  Patient was last seen by Primary Care Provider on 12/21/2015.   Patient was also seen by Dr. Gerlean Ren this AM who may be her new PCP.   Patient reports Diabetes has been poorly controlled with insulin regimen.  She is a long-standing difficult to control diabetes patient who has used low doses of insulin to control her blood sugars over the years.   She states that her control has been much worse since stopping the meal time insulin.   Patient reports adherence with medications.  Current diabetes medications include: Lantus only, willing and interested in restarting Novolog  Patient reports hypoglycemic events over the last month #2.    Patient reported exercise habits: less than in the past.   Reports being sedentary while her arm has been healing from a fracture.    O:  Lab Results  Component Value Date   HGBA1C 10.2 12/21/2015   Home fasting CBG: low 100s.   2 hour post-prandial/random CBG: higher than goal  A/P: Diabetes longstanding, currently with worsened control since stopping Novolog (prandial) insulin. Patient reports a few mild hypoglycemic events and is able to communicate appropriate hypoglycemia management plan. Patient reports adherence with medication. Control is suboptimal due to lack of prandial insulin and dosing schedule of lantus is suboptimal.   Agreed with plan by Dr. Gerlean Ren to change Lantus to the AM and restart mealtime prandial Novolog with two largest meals of the day.   Agree with plan for phone call follow up by Dr. Gerlean Ren in 1 week.   Consider alternative agents (GLP or SGLT2 in the future if glycemic control remains greater than goal.  Sample of Novolog #1 vial provided.   Written patient instructions provided.  Total  time in face to face counseling 20 minutes.   Follow up in Pharmacist Clinic Visit PRN - 2 months if goals of blood sugar control are NOT achieved with insulin.

## 2016-01-24 NOTE — Patient Instructions (Signed)
Thank you so much for coming to visit me today! - Please take your Lantus in the morning to help cover your meals during the day. - Please restart the Novolog, taking 3 units with lunch and supper. - I will call you in one week to adjust your insulin. - Please return in two months for your A1C check  Dr. Gerlean Ren

## 2016-01-24 NOTE — Progress Notes (Signed)
Patient ID: Alicia [name], female   DOB: 07/04/1955, 60 y.o.   MRN: [id number] Reviewed: Agree with Dr. Koval's documentation and management. 

## 2016-02-02 ENCOUNTER — Other Ambulatory Visit: Payer: Self-pay | Admitting: *Deleted

## 2016-02-02 DIAGNOSIS — E7800 Pure hypercholesterolemia, unspecified: Secondary | ICD-10-CM

## 2016-02-03 MED ORDER — ROSUVASTATIN CALCIUM 5 MG PO TABS: 5.0000 mg | ORAL_TABLET | ORAL | 2 refills | Status: DC

## 2016-02-16 ENCOUNTER — Other Ambulatory Visit: Payer: Self-pay | Admitting: *Deleted

## 2016-02-16 DIAGNOSIS — M792 Neuralgia and neuritis, unspecified: Secondary | ICD-10-CM

## 2016-02-18 NOTE — Telephone Encounter (Signed)
Pt called again needing a refill on gabapentin, states [facility] needs doctor approval. Please advise. Thanks! ep

## 2016-02-18 NOTE — Telephone Encounter (Signed)
2nd request.  Martin, Tamika L, RN  

## 2016-02-18 NOTE — Telephone Encounter (Signed)
Pt called again about her gabepentin refill. She states she cannot go the weekend without her refill.  "this is very serious"

## 2016-02-21 MED ORDER — GABAPENTIN 400 MG PO CAPS: ORAL_CAPSULE | ORAL | 3 refills | Status: DC

## 2016-02-21 NOTE — Telephone Encounter (Signed)
Pt is calling because she still has not had her Gabapentin refilled. She has been waiting since last week. Please call this in because she has to take this. jw

## 2016-02-21 NOTE — Telephone Encounter (Signed)
Please call patient to inform her that her gabapentin has been refilled. Thank you  Everrett Coombe, MD

## 2016-02-21 NOTE — Telephone Encounter (Signed)
4th request.  Derl Barrow, RN

## 2016-02-21 NOTE — Telephone Encounter (Signed)
Pt informed. Alicia [name], CMA  

## 2016-03-03 DIAGNOSIS — H2513 Age-related nuclear cataract, bilateral: Secondary | ICD-10-CM | POA: Diagnosis not present

## 2016-03-03 DIAGNOSIS — E113593 Type 2 diabetes mellitus with proliferative diabetic retinopathy without macular edema, bilateral: Secondary | ICD-10-CM | POA: Diagnosis not present

## 2016-03-07 ENCOUNTER — Ambulatory Visit (INDEPENDENT_AMBULATORY_CARE_PROVIDER_SITE_OTHER): Payer: Medicare Other | Admitting: Family Medicine

## 2016-03-07 ENCOUNTER — Encounter: Payer: Self-pay | Admitting: Family Medicine

## 2016-03-07 VITALS — BP 144/77 | HR 78 | Temp 98.2°F | Ht 64.0 in | Wt 178.2 lb

## 2016-03-07 DIAGNOSIS — G479 Sleep disorder, unspecified: Secondary | ICD-10-CM | POA: Insufficient documentation

## 2016-03-07 DIAGNOSIS — Z0000 Encounter for general adult medical examination without abnormal findings: Secondary | ICD-10-CM | POA: Diagnosis not present

## 2016-03-07 DIAGNOSIS — Z23 Encounter for immunization: Secondary | ICD-10-CM | POA: Diagnosis not present

## 2016-03-07 NOTE — Assessment & Plan Note (Addendum)
Patient has difficulty sleeping with previously documented daytime sleepiness, morning headaches and periods of apnea noted by her daughter.  Has seen Neurologist in the past for evaluation for sleep apnea, but insurance would not cover sleep study.  She does have poor sleep hygiene, evidenced by her watching TV and frequent use of alcohol prior to bed and occasional naps during the day.   -  recommended cutting out alcohol and screen time prior to bed -  recommend sleep journal for next month and to follow up with Dr. Burr Medico to discuss results -  determine eligibility for sleep study and if home study may qualify her for CPAP machine; referral to [facility]

## 2016-03-07 NOTE — Patient Instructions (Signed)
Today we spoke about your difficulty sleeping and possibility of having sleep apnea.  I do think getting a sleep study is very important, however we need to figure out if your insurance will cover this.   In the meantime, I recommend keeping a sleep journal.  Each morning keep a note of how you slept the night before and whether or not you had alcohol, took a nap the day before or watched TV before bed.  Please follow up with your primary doctor within a month to discuss these results.   Thank you, Cherly Anderson. Rosalyn Gess, [place] [facility] Resident PGY-1 03/07/2016 9:36 AM

## 2016-03-07 NOTE — Addendum Note (Signed)
Addended by: Eloise Levels on: 03/07/2016 10:42 AM   Modules accepted: Orders

## 2016-03-07 NOTE — Assessment & Plan Note (Signed)
Patient due for yearly flu-shot. - received vaccination

## 2016-03-07 NOTE — Progress Notes (Signed)
    Subjective:  Alicia [name] is a 60 y.o. female who presents to the [facility] today with a chief complaint of difficulty sleeping.   HPI: Alicia [name] has been having difficulty sleeping for at least the past two years.  She describes morning headaches, restless sleep and periods of apnea noted by her daughter.  She wakes up frequently to urinate and usually feels unrested in the morning. Also notes difficulty concentrating during the day and low energy.  She does frequently drink wine-- about 2 glasses prior to bedtime throughout the week.  Usually watches TV prior to bed and will sometimes take naps during the day.  She does take gabapentin daily.  Looked into having sleep study 2 years ago and saw neurologist and was unable to have study due to insurance issues.    Patient has history of diabetes, HTN  Chief complaint noted, ROS see HPI, SH smoking status noted, VS noted.    Objective:  Physical Exam: BP (!) 144/77   Pulse 78   Temp 98.2 F (36.8 C) (Oral)   Ht 5\' 4"  (1.626 m)   Wt 178 lb 3.2 oz (80.8 kg)   BMI 30.59 kg/m   Gen: 60yo F in NAD, resting comfortably HEENT: malampati III, clear oropharynx CV: RRR with no murmurs appreciated Pulm: NWOB, CTAB with no crackles, wheezes, or rhonchi GI: Normal bowel sounds present. Soft, Nontender, Nondistended. MSK: no edema, cyanosis, or clubbing noted Skin: warm, dry Neuro: grossly normal, moves all extremities Psych: Normal affect and thought content  No results found for this or any previous visit (from the past 72 hour(s)).   Assessment/Plan:  Difficulty sleeping Patient has difficulty sleeping with previously documented daytime sleepiness, morning headaches and periods of apnea noted by her daughter.  Has seen Neurologist in the past for evaluation for sleep apnea, but insurance would not cover sleep study.  She does have poor sleep hygiene, evidenced by her watching TV and frequent use of alcohol prior to bed and occasional naps  during the day.   -  recommended cutting out alcohol and screen time prior to bed -  recommend sleep journal for next month and to follow up with Dr. Burr Medico to discuss results -  determine eligibility for sleep study and if home study may qualify her for CPAP machine; referral to [facility] maintenance Patient due for yearly flu-shot. - received vaccination

## 2016-03-08 ENCOUNTER — Other Ambulatory Visit: Payer: Self-pay | Admitting: *Deleted

## 2016-03-10 MED ORDER — MELOXICAM 15 MG PO TABS: 15.0000 mg | ORAL_TABLET | Freq: Every day | ORAL | 2 refills | Status: DC

## 2016-04-03 NOTE — Progress Notes (Deleted)
   CC: ***  HPI: Alicia [name] is a 60 y.o. female with PMH diabetes, HLD and HTN, and asthma who presents to [facility] today with *** of *** duration.    Review of Symptoms: See HPI for ROS.   CC, SH/smoking status, and VS noted.  Objective: There were no vitals taken for this visit. GEN: NAD, alert, cooperative, and pleasant.*** EYE: no conjunctival injection, pupils equally round and reactive to light ENMT: normal tympanic light reflex, no nasal polyps,no rhinorrhea, no pharyngeal erythema or exudates NECK: full ROM, no thyromegally RESPIRATORY: clear to auscultation bilaterally with no wheezes, rhonchi or rales, good effort CV: RRR, no m/r/g, no peripheral edema GI: soft, non-tender, non-distended, normoactive bowel sounds, no hepatosplenomegaly SKIN: warm and dry, no rashes or lesions NEURO: II-XII grossly intact, normal gait, peripheral sensation intact PSYCH: AAOx3, appropriate affect  Assessment and plan:  No problem-specific Assessment & Plan notes found for this encounter.   No orders of the defined types were placed in this encounter.   No orders of the defined types were placed in this encounter.    Everrett Coombe, MD,MS,  PGY1 04/03/2016 9:20 PM

## 2016-04-04 ENCOUNTER — Ambulatory Visit: Payer: Self-pay | Admitting: Student in an Organized Health Care Education/Training Program

## 2016-04-09 NOTE — Progress Notes (Signed)
CC: Abdominal Pain  HPI: Alicia [name] is a 60 y.o. female PMHx diabetes, HTN, hearing loss, HLD, and asthma who presents to [facility] today with abdominal pain of 2 weeks duration.  History was obtained with the assistance of a face-to-face ASL interpreter.  Abdominal Pain; LUQ - Began 2 weeks ago, has been intermittent, worse when lies down to sleep and with eating, worse late in the day.   - No identifiable palliating factors - Upper left quadrant throbbing pain that sometimes radiates to her back - No diarrhea or constipation, no dark or bloody stools - Does endorse recent NSAID use with meloxicam for her previous wrist fracture  Diabetes In 01/2016 changes were made to insulin regimen with transition of Lantus from AM to PM dosing. Lantus had recently been titrated up to 21u daily per previous note however patient endorses she has continued taking 11u QHS.  Novolog 3 units with lunch and dinner were also restarted at that time and she endorses taking this without any barriers and without skipped doses.   Difficulty sleeping Patient previously seen in early 03/2016 for difficulty sleeping with plan to decrease alcohol and tv use prior to sleep.  She reports successfully scheduling an appointment and she has the information for that appointment at home. - Per her daughter the patient has been having snoring with startling awake - Has discontinued wine prior to sleep but has been unable to cut out television prior to sleep   Review of Symptoms: See HPI for ROS.   CC, SH/smoking status, and VS noted.   Objective: BP 130/64   Pulse 75   Temp 97.3 F (36.3 C) (Oral)   Ht 5\' 4"  (1.626 m)   Wt 179 lb 8 oz (81.4 kg)   BMI 30.81 kg/m  GEN: NAD, alert, cooperative, and pleasant. Speaks using sign language with interpreter at bedside. EYE: no conjunctival injection, pupils equally round and reactive to light ENMT: normal tympanic light reflex, no nasal polyps,no rhinorrhea, no pharyngeal  erythema or exudates NECK: full ROM, no thyromegally RESPIRATORY: clear to auscultation bilaterally with no wheezes, rhonchi or rales, good effort CV: RRR, no m/r/g, no peripheral edema GI: soft, minimally tender to palpation in LUQ, negative murphy's sign, no rebound or guarding, non-distended, normoactive bowel sounds, no hepatosplenomegaly SKIN: warm and dry, no rashes or lesions PSYCH: AAOx3, appropriate affect  Assessment and plan:  Type 2 diabetes mellitus (HCC) HbA1c 8.6, improved from 10.2 on previous test. - Continue Lantus 11u QHS - patient agreed to record AM fasting glucose for one week and call with results so lantus may be titrated as necessary - Continue novolog 3u with dinner and lunch - recheck A1c in 3 months (~January 9)  Abdominal pain, left upper quadrant Consider chronic pancreatitis vs PUD vs reflux.  Less likely cholecystitis/ GB colic given LUQ location and pattern of pain. - check lipase today - patient sent home with stool guaiac card - 4 week PPI trial with pantoprazole; asked patient to follow up in 1 month - if pain not improved on PPI, consider extending course x4 more weeks and/or referral to GI for upper endoscopy assuming lipase is WNL  Difficulty sleeping Discussed sleep hygiene at length, difficult to illicit readiness for change. Patient also not amenable to pharmacologic interventions to help with sleep. - Will follow up results of sleep study after it is performed; patient indicates it is scheduled for either October or November.   Orders Placed This Encounter  Procedures  .  Lipase  . HgB A1c    Meds ordered this encounter  Medications  . pantoprazole (PROTONIX) 40 MG tablet    Sig: Take 1 tablet (40 mg total) by mouth daily.    Dispense:  30 tablet    Refill:  0     Everrett Coombe, MD,MS,  PGY1 04/10/2016 3:53 PM

## 2016-04-10 ENCOUNTER — Ambulatory Visit (INDEPENDENT_AMBULATORY_CARE_PROVIDER_SITE_OTHER): Payer: Medicare Other | Admitting: Student in an Organized Health Care Education/Training Program

## 2016-04-10 ENCOUNTER — Encounter: Payer: Self-pay | Admitting: Student in an Organized Health Care Education/Training Program

## 2016-04-10 VITALS — BP 130/64 | HR 75 | Temp 97.3°F | Ht 64.0 in | Wt 179.5 lb

## 2016-04-10 DIAGNOSIS — E118 Type 2 diabetes mellitus with unspecified complications: Secondary | ICD-10-CM | POA: Diagnosis not present

## 2016-04-10 DIAGNOSIS — G479 Sleep disorder, unspecified: Secondary | ICD-10-CM | POA: Diagnosis not present

## 2016-04-10 DIAGNOSIS — Z794 Long term (current) use of insulin: Secondary | ICD-10-CM

## 2016-04-10 DIAGNOSIS — R1012 Left upper quadrant pain: Secondary | ICD-10-CM | POA: Diagnosis not present

## 2016-04-10 LAB — POCT GLYCOSYLATED HEMOGLOBIN (HGB A1C): Hemoglobin A1C: 8.6

## 2016-04-10 LAB — LIPASE: Lipase: 8 U/L (ref 7–60)

## 2016-04-10 MED ORDER — PANTOPRAZOLE SODIUM 40 MG PO TBEC: 40.0000 mg | DELAYED_RELEASE_TABLET | Freq: Every day | ORAL | 0 refills | Status: DC

## 2016-04-10 NOTE — Patient Instructions (Addendum)
It was a pleasure seeing you today in our clinic. Today we discussed diabetes, sleep disturbances and abdominal pain. Here is the treatment plan we have discussed and agreed upon together:  Abdominal Pain - Please take home to stool guaiaccard and complete it as instructed - We tested your lipase to see if your pancreas is causing the pain; I will let you know the results of this test - We will do a 4 week trial on a medication called Protonix.  Please schedule a follow-up visit in 4 weeks. - If your abdominal pain becomes much worse, does not improve on protonix after 2 weeks, or you begin to develop fevers associated with the pain, please come in to be seen sooner.  Sleep - We will follow up the results of your sleep study after you have that appointment - We talked about strategies to improve sleep today  Diabetes Your diabetes is still not controlled, but improved from your previous visit.  Your A1c today is 8.6 Your goal is to have an A1c < 7.0 Medicine Changes: No changes today. Homework: Please record your AM blood glucose for the next week and let us know those numbers so if necessary we may adjust your Lantus regimen over the phone.  Come back to see [name] in: 4 weeks for follow up of your abdominal pain, or sooner if your pain becomes much worse on the new medication.  Our clinic's number is [phone]. Please call with questions or concerns about what we discussed today.  - Dr. Burr Medico

## 2016-04-10 NOTE — Assessment & Plan Note (Signed)
Discussed sleep hygiene at length, difficult to illicit readiness for change. Patient also not amenable to pharmacologic interventions to help with sleep. - Will follow up results of sleep study after it is performed; patient indicates it is scheduled for either October or November.

## 2016-04-10 NOTE — Assessment & Plan Note (Signed)
Consider chronic pancreatitis vs PUD vs reflux.  Less likely cholecystitis/ GB colic given LUQ location and pattern of pain. - check lipase today - patient sent home with stool guaiac card - 4 week PPI trial with pantoprazole; asked patient to follow up in 1 month - if pain not improved on PPI, consider extending course x4 more weeks and/or referral to GI for upper endoscopy assuming lipase is WNL

## 2016-04-10 NOTE — Assessment & Plan Note (Signed)
HbA1c 8.6, improved from 10.2 on previous test. - Continue Lantus 11u QHS - patient agreed to record AM fasting glucose for one week and call with results so lantus may be titrated as necessary - Continue novolog 3u with dinner and lunch - recheck A1c in 3 months (~January 9)

## 2016-04-11 ENCOUNTER — Encounter: Payer: Self-pay | Admitting: Student in an Organized Health Care Education/Training Program

## 2016-04-19 ENCOUNTER — Telehealth: Payer: Self-pay

## 2016-04-19 NOTE — Telephone Encounter (Signed)
LM for pt to call the office. Need to inform her that she will need to redo the hemoccult cards. The cards she was given the first time were expired and could not be tested. Her options are to come by the office and pick up the cards or wait and get them on her next appt on 05/01/2016. Ottis Stain, CMA

## 2016-04-20 ENCOUNTER — Telehealth: Payer: Self-pay | Admitting: Student in an Organized Health Care Education/Training Program

## 2016-04-20 NOTE — Telephone Encounter (Signed)
Message delivered She will pick them up on oct 30

## 2016-04-30 NOTE — Progress Notes (Deleted)
   CC: ***  HPI: Alicia [name] is a 60 y.o. female with PMH diabetes, HTN, hearing loss, HLD and asthma who presents to [facility] today with *** of *** duration. History was obatined with the assistance of a face-to-face ASL interpreter***   Abdominal Pain, LUQ - on previous visit had been going on for two weeks, with no identifiable palliating factors.  Was throbbing and sometimes radiates to her back. Had been using NSAIDs for previous fracture.  - Lipase was WNL, patient was sent with stool guaiac card ***results. - Patient was started on 4 week trial PPI with pantoprazole and asked to follow up in 1 month.  - if pain not improved on PPI, consider extending course x4 more weeks and/or referral to GI for upper endoscopy assuming lipase is WNL   Type 2 diabetes mellitus ([place]) - plan from previous note HbA1c 8.6, improved from 10.2 on previous test. - Continue Lantus 11u QHS - patient agreed to record AM fasting glucose for one week and call with results so lantus may be titrated as necessary - Continue novolog 3u with dinner and lunch - recheck A1c in 3 months (~January 9)   Review of Symptoms: See HPI for ROS.   CC, SH/smoking status, and VS noted.  Objective: There were no vitals taken for this visit. GEN: NAD, alert, cooperative, and pleasant.*** EYE: no conjunctival injection, pupils equally round and reactive to light ENMT: normal tympanic light reflex, no nasal polyps,no rhinorrhea, no pharyngeal erythema or exudates NECK: full ROM, no thyromegally RESPIRATORY: clear to auscultation bilaterally with no wheezes, rhonchi or rales, good effort CV: RRR, no m/r/g, no peripheral edema GI: soft, non-tender, non-distended, normoactive bowel sounds, no hepatosplenomegaly SKIN: warm and dry, no rashes or lesions NEURO: II-XII grossly intact, normal gait, peripheral sensation intact PSYCH: AAOx3, appropriate affect  Assessment and plan:  No problem-specific Assessment & Plan notes  found for this encounter.   No orders of the defined types were placed in this encounter.   No orders of the defined types were placed in this encounter.    Everrett Coombe, MD,MS,  PGY1 04/30/2016 9:43 PM

## 2016-05-01 ENCOUNTER — Ambulatory Visit: Payer: Self-pay | Admitting: Student in an Organized Health Care Education/Training Program

## 2016-05-05 ENCOUNTER — Encounter: Payer: Self-pay | Admitting: Family Medicine

## 2016-05-05 ENCOUNTER — Ambulatory Visit (INDEPENDENT_AMBULATORY_CARE_PROVIDER_SITE_OTHER): Payer: Medicare Other | Admitting: Family Medicine

## 2016-05-05 DIAGNOSIS — S39012A Strain of muscle, fascia and tendon of lower back, initial encounter: Secondary | ICD-10-CM | POA: Diagnosis not present

## 2016-05-05 DIAGNOSIS — S99922A Unspecified injury of left foot, initial encounter: Secondary | ICD-10-CM

## 2016-05-05 NOTE — Progress Notes (Signed)
    Subjective:  Alicia [name] is a 60 y.o. female who presents to the [facility] today with a chief complaint of right lower back pain, left second toe pain  HPI:  Right lower back: Reports 1 week of right lower back pain, and has tried taking Aleve without much relief. At work she does do quite a bit of sleeping, and cleaning tables. She also has many grandchildren whom she picks up.  She denies any trauma, numbness or tingling down her extremity and no fevers or night pain.   Left second toe pain: Stubbed her toe about a month ago, and had some pain at that point that ultimately went away. Patient started again yesterday, she denies any drainage, fevers or redness on the toe or surrounding area. She has previously had toenail removed on the other foot for infection due to ingrown toenail.  She does have a history of type 2 diabetes, was previously uncontrolled.   PMH: Type 2 diabetes, osteoarthritis of the knee ROS: See history of present illness  Objective:  Physical Exam: BP 139/70 (BP Location: Right Arm, Patient Position: Sitting, Cuff Size: Normal)   Pulse 79   Temp 97.8 F (36.6 C) (Oral)   Wt 178 lb 3.2 oz (80.8 kg)   LMP  (Exact Date)   BMI 30.59 kg/m   Gen: 60 year old female in NAD, resting comfortably CV: RRR with no murmurs appreciated Pulm: NWOB, CTAB with no crackles, wheezes, or rhonchi GI: Normal bowel sounds present. Soft, Nontender, Nondistended. MSK: no edema, cyanosis, or clubbing noted, mild lumbar paraspinal tenderness, on the right side and some right SI joint tenderness. Trunk had full range of motion, without any discomfort. No spinal tenderness.  Skin: warm, dry, left second toe with lateral aspect of toenail cracked off on the distal portion. No erythema, swelling or drainage, however mildly tender to palpation.   No results found for this or any previous visit (from the past 72 hour(s)).   Assessment/Plan:  Lumbar strain, initial encounter Patient has one  week of right-sided lower back pain, and the lumbar region without any red flags.  Her spinal muscles on the right lumbar region mildly tender to palpation, as well as right SI joint tenderness. However patient has great range of motion without any tenderness and no spinal tenderness.  - Recommended Tylenol, heating pad, and over-the-counter topical agents such as BenGay - Can follow-up in one month if pain persists, and at that point can consider course of baclofen  Injury of left toe Stubbed her toe about a month ago, he had resolved up until yesterday when she noticed that the distal lateral aspect of the toe had become painful.  No erythema, drainage or swelling at the site.   - Recommended salt water soaks - Can follow-up in one month if pain persists and toe starts to show signs of infection and consider partial toenail removal

## 2016-05-05 NOTE — Assessment & Plan Note (Signed)
Stubbed her toe about a month ago, he had resolved up until yesterday when she noticed that the distal lateral aspect of the toe had become painful.  No erythema, drainage or swelling at the site.   - Recommended salt water soaks - Can follow-up in one month if pain persists and toe starts to show signs of infection and consider partial toenail removal

## 2016-05-05 NOTE — Assessment & Plan Note (Signed)
Patient has one week of right-sided lower back pain, and the lumbar region without any red flags.  Her spinal muscles on the right lumbar region mildly tender to palpation, as well as right SI joint tenderness. However patient has great range of motion without any tenderness and no spinal tenderness.  - Recommended Tylenol, heating pad, and over-the-counter topical agents such as BenGay - Can follow-up in one month if pain persists, and at that point can consider course of baclofen

## 2016-05-05 NOTE — Patient Instructions (Signed)
Alicia [name], you were seen today for follow-up for abdominal pain. I'm glad to hear that the Protonix has helped with that and that the symptoms have resolved.  I think your lower back pain is likely a muscle strain.  Didn't have any concerning signs for any nerve compression or damage. Taking Tylenol, using heating pads and some over-the-counter topical agents like BenGay should be helpful.  I would give this a few weeks to resolve.  Following up in a month would be a good idea.  Your toe, although painful is not infected this point.  You can use saltwater baths to soak the foot.  This should help with the pain and help prevent any infection.  He can also follow up in 1 month for this, and we can reassess at that point whether or not we need to remove some of the toenail if it begins to look infected and continues to be painful.   Take care, Alicia [name] L. Rosalyn Gess, [place] Resident PGY-1 05/05/2016 9:02 AM

## 2016-05-10 ENCOUNTER — Ambulatory Visit (INDEPENDENT_AMBULATORY_CARE_PROVIDER_SITE_OTHER): Payer: Medicare Other | Admitting: Pulmonary Disease

## 2016-05-10 ENCOUNTER — Encounter: Payer: Self-pay | Admitting: Pulmonary Disease

## 2016-05-10 VITALS — BP 126/84 | HR 77 | Ht 64.0 in | Wt 182.0 lb

## 2016-05-10 DIAGNOSIS — R0683 Snoring: Secondary | ICD-10-CM

## 2016-05-10 NOTE — Progress Notes (Signed)
   Subjective:    Patient ID: Alicia [name], female    DOB: 23-Apr-1956, 60 y.o.   MRN: [id number]  HPI    Review of Systems  Constitutional: Positive for unexpected weight change. Negative for fever.  HENT: Positive for congestion, ear pain, rhinorrhea and sneezing. Negative for dental problem, nosebleeds, postnasal drip, sinus pressure, sore throat and trouble swallowing.   Eyes: Negative for redness and itching.  Respiratory: Positive for cough. Negative for chest tightness, shortness of breath and wheezing.   Cardiovascular: Negative for palpitations and leg swelling.  Gastrointestinal: Positive for nausea and vomiting.  Genitourinary: Negative for dysuria.  Musculoskeletal: Negative for joint swelling.  Skin: Negative for rash.  Neurological: Positive for headaches.  Hematological: Does not bruise/bleed easily.  Psychiatric/Behavioral: Negative for dysphoric mood. The patient is not nervous/anxious.        Objective:   Physical Exam        Assessment & Plan:

## 2016-05-10 NOTE — Patient Instructions (Signed)
Will arrange for in lab sleep study Will call to arrange for follow up after sleep study reviewed  

## 2016-05-10 NOTE — Progress Notes (Signed)
Past Surgical History She  has a past surgical history that includes Appendectomy; Tubal ligation; Cesarean section; Uterine fibroid embolization (2007); cardiolyte EF 77%, no ischemia in 2006 (2006); Wrist surgery; Wrist surgery (Right, 1985); Carpal tunnel release (Right, 04/20/2015); Ulnar nerve transposition (Right, 04/20/2015); Trigger finger release (Right, 04/20/2015); Open reduction internal fixation (orif) distal radial fracture (Left, 11/04/2015); and Carpal tunnel release (Left, 11/04/2015).  Allergies  Allergen Reactions  . Sulfonamide Derivatives Swelling and Rash    REACTION: rash, swelling - "Lost BABY" - Terrible itching.   . Lipitor [Atorvastatin Calcium] Other (See Comments)    Muscle Aches - Mild-Moderate - completely resolved with D/C of atorva.   . Ramipril Other (See Comments)    REACTION: cough    Family History Her family history includes Cancer in her maternal aunt and maternal grandmother; Diabetes in her father; Hypertension in her mother.  Social History She  reports that she has never smoked. She has never used smokeless tobacco. She reports that she drinks alcohol. She reports that she does not use drugs.  Review of systems C/o headaches, intermittent sinus congestion.  Otherwise negative.   Current Outpatient Prescriptions on File Prior to Visit  Medication Sig  . ACCU-CHEK SOFTCLIX LANCETS lancets TEST 4 TIMES A DAY  . aspirin (BAYER CHILDRENS ASPIRIN) 81 MG chewable tablet Chew 81 mg by mouth daily.    . Calcium Carbonate-Vit D-Min (CALCIUM 600+D PLUS MINERALS) 600-400 MG-UNIT TABS Take by mouth.  . Calcium Carbonate-Vitamin D (CALCIUM 600/VITAMIN D) 600-400 MG-UNIT per chew tablet Chew 1 tablet by mouth 2 (two) times daily.   . cholecalciferol (VITAMIN D) 1000 UNITS tablet Take 1,000 Units by mouth daily.  . cyanocobalamin 500 MCG tablet Take 500 mcg by mouth daily.  . cyclobenzaprine (FLEXERIL) 5 MG tablet Take 1 tablet (5 mg total) by mouth 3 (three)  times daily as needed for muscle spasms.  . fluticasone (FLONASE) 50 MCG/ACT nasal spray   . gabapentin (NEURONTIN) 400 MG capsule TAKE 3 CAPSULES BY MOUTH TWICE A DAY  . glucose blood (ACCU-CHEK AVIVA PLUS) test strip CHECK SUGAR 4 TIMES A DAILY  . hydrochlorothiazide (HYDRODIURIL) 50 MG tablet TAKE 1 TABLET BY MOUTH EVERY DAY  . hyoscyamine (LEVSIN/SL) 0.125 MG SL tablet Take one tablet every 6 hours only for persistent cramping  . insulin aspart (NOVOLOG) 100 UNIT/ML injection Inject 3 units with lunch and supper.  . insulin glargine (LANTUS) 100 UNIT/ML injection INJECT 6 UNITS SUBCUTANEOUSLY AT BEDTIME  . losartan (COZAAR) 100 MG tablet TAKE 1 TABLET BY MOUTH EVERY DAY  . meloxicam (MOBIC) 15 MG tablet Take 1 tablet (15 mg total) by mouth daily.  . metFORMIN (GLUCOPHAGE) 1000 MG tablet TAKE 1 TABLET BY MOUTH TWO TIMES DAILY.  Marland Kitchen metoCLOPramide (REGLAN) 10 MG tablet TAKE 1 TABLET BY MOUTH 3 TIMES A DAY BEFORE MEALS  . metoprolol succinate (TOPROL-XL) 50 MG 24 hr tablet TAKE 1 TABLET BY MOUTH EVERY DAY *TAKE WITH OR IMMEDIATELY FOLLOWING A MEAL  . pantoprazole (PROTONIX) 40 MG tablet Take 1 tablet (40 mg total) by mouth daily.  Marland Kitchen PATADAY 0.2 % SOLN PLACE 1 DROP INTO EACH EYE EVERY DAY AS NEEDED  . polyethylene glycol powder (GLYCOLAX/MIRALAX) powder TAKE 17 GRAMS MIXED IN LIQUID THREE TIMES A WEEK  . rosuvastatin (CRESTOR) 5 MG tablet Take 1 tablet (5 mg total) by mouth every other day.  . Triamcinolone Acetonide 0.025 % LOTN Apply a small amount twice daily to thumb area for 2 weeks then daily prn  No current facility-administered medications on file prior to visit.     Chief Complaint  Patient presents with  . Sleep Consult    Referred by PCP.  Epworth Score: 18    Past medical history She  has a past medical history of Anemia; Complete deafness; Deaf; Diabetes mellitus; Diabetic neuropathy ([place]); Ganglion cyst (06/22/2011); Gastroparesis; GERD (gastroesophageal reflux disease);  H/O: C-section; Hyperlipidemia; Hypertension; Neuromuscular disorder ([place]); and S/P appy.  Vital signs BP 126/84 (BP Location: Left Arm, Cuff Size: Normal)   Pulse 77   Ht 5\' 4"  (1.626 m)   Wt 182 lb (82.6 kg)   LMP  (Exact Date)   SpO2 97%   BMI 31.24 kg/m   History of Present Illness Alicia [name] is a 60 y.o. female for evaluation of sleep problems.  Interview conducting with assistance of sign language interpreter.    Her daughter told her that she snores loudly, and will stop breathing while asleep.  This happens more on her back.  She will get dry mouth and wake up feeling choked at times.  She tends to wake up during dreams frequently.  She has trouble staying awake during the day, and has fall asleep at work sometimes.    She goes to sleep between 10 pm and 12 am.  She has trouble falling asleep and staying asleep.  She is up and down all night.  She wakes up several times to use the bathroom.  She gets out of bed at 7 am.  She feels achy in the morning.  She gets frequent morning headaches.  She does not use anything to help her fall sleep or stay awake.  She denies sleep walking, sleep talking, bruxism, or nightmares.  There is no history of restless legs.  She denies sleep hallucinations, sleep paralysis, or cataplexy.  The Epworth score is 18 out of 24.   Physical Exam:  General - No distress ENT - No sinus tenderness, no oral exudate, no LAN, no thyromegaly, TM clear, pupils equal/reactive, MP 4 Cardiac - s1s2 regular, no murmur, pulses symmetric Chest - No wheeze/rales/dullness, good air entry, normal respiratory excursion Back - No focal tenderness Abd - Soft, non-tender, no organomegaly, + bowel sounds Ext - No edema Neuro - Normal strength, cranial nerves intact Skin - No rashes Psych - Normal mood, and behavior  Discussion: She has snoring, sleep disruption, apnea, and daytime sleepiness.  She has hx of HTN and DM.  I am concerned she could have sleep  apnea.  We discussed how sleep apnea can affect various health problems, including risks for hypertension, cardiovascular disease, and diabetes.  We also discussed how sleep disruption can increase risks for accidents, such as while driving.  Weight loss as a means of improving sleep apnea was also reviewed.  Additional treatment options discussed were CPAP therapy, oral appliance, and surgical intervention.  Assessment/plan:  Snoring with concern for sleep apnea. - will arrange for in lab sleep study to further assess   Patient Instructions  Will arrange for in lab sleep study Will call to arrange for follow up after sleep study reviewed     Chesley Mires, M.D. Pager [phone] 05/10/2016, 10:15 AM

## 2016-05-19 LAB — POC HEMOCCULT BLD/STL (HOME/3-CARD/SCREEN)
Card #2 Fecal Occult Blod, POC: NEGATIVE
Card #3 Fecal Occult Blood, POC: NEGATIVE
Fecal Occult Blood, POC: NEGATIVE

## 2016-05-19 NOTE — Addendum Note (Signed)
Addended by: [place] Pink on: 05/19/2016 08:15 AM   Modules accepted: Orders

## 2016-06-01 ENCOUNTER — Ambulatory Visit: Payer: Self-pay | Admitting: Family Medicine

## 2016-06-01 ENCOUNTER — Telehealth: Payer: Self-pay | Admitting: *Deleted

## 2016-06-01 DIAGNOSIS — E119 Type 2 diabetes mellitus without complications: Secondary | ICD-10-CM

## 2016-06-01 DIAGNOSIS — Z794 Long term (current) use of insulin: Principal | ICD-10-CM

## 2016-06-01 NOTE — Telephone Encounter (Signed)
Received faxed refill request from [id number] for novolog 100 units/ mL vial Qty 61mL with 5 refills. novolog on current med list is for sample med only. Please advise.  Hubbard Hartshorn, RN, BSN

## 2016-06-02 ENCOUNTER — Encounter: Payer: Self-pay | Admitting: Family Medicine

## 2016-06-02 ENCOUNTER — Ambulatory Visit (INDEPENDENT_AMBULATORY_CARE_PROVIDER_SITE_OTHER): Payer: Medicare Other | Admitting: Family Medicine

## 2016-06-02 VITALS — BP 123/77 | HR 108 | Temp 97.6°F | Wt 182.0 lb

## 2016-06-02 DIAGNOSIS — M7050 Other bursitis of knee, unspecified knee: Secondary | ICD-10-CM | POA: Diagnosis present

## 2016-06-02 DIAGNOSIS — E119 Type 2 diabetes mellitus without complications: Secondary | ICD-10-CM | POA: Diagnosis not present

## 2016-06-02 DIAGNOSIS — Z794 Long term (current) use of insulin: Secondary | ICD-10-CM | POA: Diagnosis not present

## 2016-06-02 MED ORDER — INSULIN ASPART 100 UNIT/ML ~~LOC~~ SOLN: SUBCUTANEOUS | 0 refills | Status: DC

## 2016-06-02 MED ORDER — MELOXICAM 15 MG PO TABS: 15.0000 mg | ORAL_TABLET | Freq: Every day | ORAL | 0 refills | Status: DC

## 2016-06-02 NOTE — Assessment & Plan Note (Signed)
Patient requested a refill on her Novolog. It looks like the last time she received NovoLog, it was a sample. I prescribed her 10 mL with no refills.

## 2016-06-02 NOTE — Patient Instructions (Addendum)
It was nice to meet you. I have prescribed you mobic to help with the knee pain. Take it daily for 1 week, then as needed daily after that. Try to rest and ice your knees when you can. If you're symptoms are not improving, please follow up with your PCP. I have also sent a prescription to your pharmacy for the [place].   Pes Anserine Bursitis The pes anserine is an area on the inside of your knee, just below the joint, which is cushioned by a fluid-filled sac (bursa). Pes anserine bursitis is a condition that happens when this bursa gets swollen and irritated. The condition causes knee pain. What are the causes? This condition may be caused by:  Making the same movement over and over.  A hit to the inside of the leg. What increases the risk? This injury is most likely to develop in:  Runners.  Athletes who play sports that involve a lot of running and quick side-to-side movements (cutting).  Athletes who play contact sports.  People who swim using an inward angle of the knee, such as with the breaststroke.  People with tight hamstring muscles.  Females.  People who are overweight.  People with flat feet.  People who have diabetes or osteoarthritis. What are the signs or symptoms? Symptoms of this condition include:  Knee pain that gets better with rest and worse with activities like climbing stairs, walking, running, or getting in and out of a chair (common).  Swelling.  Warmth.  Tenderness when pressing at the inside of the lower leg, just below the knee. How is this diagnosed? This condition may be diagnosed based on:  Your symptoms.  Your medical history.  A physical exam.  Tests, such as:  X-rays.  MRI and ultrasound. These tests are done to check for swelling and fluid buildup in the bursa and to look at muscles and tendons. During your physical exam, your health care provider will press on the tendon attachment to see if you feel pain. He or she may  also check your hip and knee motion and strength. How is this treated? This condition may be treated by:  Resting your knee.  Avoiding activities that cause pain.  Icing the inside of your knee.  Raising (elevating) your knee while resting.  Sleeping with a pillow between your knees. This will cushion your injured knee.  Taking medicine to reduce pain and swelling.  Having medicines injected into your knee.  Doing strengthening and stretching exercises (physical therapy). If these treatments do not work or if the condition keeps coming back, you may need to have surgery to remove the bursa. Follow these instructions at home: Managing pain, stiffness, and swelling  If directed, apply ice to your knee.  Put ice in a plastic bag.  Place a towel between your skin and the bag.  Leave the ice on for 20 minutes, 2-3 times a day.  While you are sitting, elevate your knee.  While you are lying down, elevate your knee above the level of your heart.  Take over-the-counter and prescription medicines only as told by your health care provider. Activity  Return to your normal activities as told by your health care provider. Ask your health care provider what activities are safe for you.  Do exercises as told by your health care provider. General instructions  Sleep with a pillow between your knees.  Do not use any tobacco products, such as cigarettes, chewing tobacco, and e-cigarettes. Tobacco can delay healing.  If you need help quitting, ask your health care provider.  Keep all follow-up visits as told by your health care provider. This is important. How is this prevented?  Warm up and stretch before being active.  Cool down and stretch after being active.  Give your body time to rest between periods of activity.  Make sure to use equipment that fits you.  Be safe and responsible while being active to avoid falls.  Do at least 150 minutes of moderate-intensity exercise  each week, such as brisk walking or water aerobics.  Maintain physical fitness, including:  Strength.  Flexibility.  Cardiovascular fitness.  Endurance. Contact a health care provider if:  Your symptoms do not improve.  Your symptoms get worse. This information is not intended to replace advice given to you by your health care provider. Make sure you discuss any questions you have with your health care provider. Document Released: 06/19/2005 Document Revised: 02/22/2016 Document Reviewed: 06/04/2015 Elsevier Interactive Patient Education  2017 [employer].

## 2016-06-02 NOTE — Progress Notes (Signed)
Subjective: CC: bilateral knee pain HPI: Patient is a 60 y.o. female with a past medical history of knee pain presenting to clinic today for a SDA for knee pain.  She notes she's had intermittent bilateral knee pain for several years. Since thanksgiving,  the pain has been consistent, L>R. She notes the pain is on the medial aspect just below the knee cap, throbbing in nature. Even touching the knees in that locations is painful.   Crossing/bending knees makes it worse. Ambulation makes it worse. Going down steps is the most bothersome. No locking, popping, crepitus, or giving way sensation. No swelling, warmth, or erythema noted.  Never injured the knees that she knows of.  Social History: lives upstairs, works at a [employer] Maintenance: up to date on flu vaccienes   ROS: All other systems reviewed and are negative.  Past Medical History Patient Active Problem List   Diagnosis Date Noted  . Lumbar strain, initial encounter 05/05/2016  . Injury of left toe 05/05/2016  . Abdominal pain, left upper quadrant 04/10/2016  . Difficulty sleeping 03/07/2016  . Healthcare maintenance 03/07/2016  . Pes anserine bursitis 12/21/2015  . Statin myopathy 01/21/2015  . Falls 12/08/2014  . New onset of headaches after age 77 12/08/2014  . Ataxia 11/13/2014  . Bilateral thigh pain 10/28/2014  . Myalgia 09/28/2014  . Seizure ([place]) 06/08/2014  . Trigger thumb of left hand 02/06/2014  . Carpal tunnel syndrome 01/05/2014  . Diabetic retinopathy ([place]) 07/21/2011  . Osteoarthritis of carpometacarpal joint of thumb 04/07/2011  . Gastroparesis 04/15/2010  . Asthma 04/04/2010  . ARTHRITIS, KNEE 09/17/2007  . Diabetic polyneuropathy ([place]) 08/30/2006  . Type 2 diabetes mellitus ([place]) 08/30/2006  . HYPERCHOLESTEROLEMIA 08/30/2006  . HEARING LOSS NOS OR DEAFNESS 08/30/2006  . Essential HTN 08/30/2006  . Allergic rhinitis 08/30/2006    Medications- reviewed and  updated  Objective: Office vital signs reviewed. BP 123/77   Pulse (!) 108   Temp 97.6 F (36.4 C) (Oral)   Wt 182 lb (82.6 kg)   LMP  (Exact Date)   BMI 31.24 kg/m    Physical Examination:  General: Awake, alert, well- nourished, NAD Knees: Normal to inspection without erythema, ecchymoses, effusion or obvious bony abnormalities.  No obvious Baker's cysts. Palpation normal with no warmth or joint line tenderness or patellar tenderness or condyle tenderness. TTP over the pes anserine bursas bilaterally.    ROM normal in flexion (135 degrees) and extension (0 degrees) and lower leg rotation. Ligaments with solid consistent endpoints including ACL, PCL, LCL, MCL.  Negative Anterior Drawer/Lachman/Pivot Shift Negative Mcmurray's.  Non painful patellar compression.  Normal Patellar glide.  No apprehension  Patellar and quadriceps tendons unremarkable. Hamstring and quadriceps strength is normal.   Assessment/Plan: Pes anserine bursitis This patient is presenting with bilateral knee pain present in the medial aspect just inferior to the patella. On examination, she has significant tenderness to palpation over the pes anserine bursa bilaterally. Other aspects of the knee physical exam are unremarkable. She did have imaging of her knees bilaterally in April 2016 which revealed mild medial compartment degenerative changes, however I do not suspect that this is contributing to her current pain. - Discussed conservative management with melenic daily for 1 week, then as needed daily after that. -Rest, ice, and elevation. -Discussed return precautions with the patient.  Type 2 diabetes mellitus ([place]) Patient requested a refill on her Novolog. It looks like the last time she received NovoLog, it was a  sample. I prescribed her 10 mL with no refills.   No orders of the defined types were placed in this encounter.   Meds ordered this encounter  Medications  . insulin aspart (NOVOLOG) 100  UNIT/ML injection    Sig: Inject 3 units with lunch and supper.    Dispense:  10 mL    Refill:  0    Order Specific Question:   Lot Number?    Answer:   [id number]    Order Specific Question:   Expiration Date?    Answer:   09/30/2016    Order Specific Question:   Quantity    Answer:   1  . meloxicam (MOBIC) 15 MG tablet    Sig: Take 1 tablet (15 mg total) by mouth daily.    Dispense:  30 tablet    Refill:  [place] PGY-3, [place]

## 2016-06-02 NOTE — Assessment & Plan Note (Signed)
This patient is presenting with bilateral knee pain present in the medial aspect just inferior to the patella. On examination, she has significant tenderness to palpation over the pes anserine bursa bilaterally. Other aspects of the knee physical exam are unremarkable. She did have imaging of her knees bilaterally in April 2016 which revealed mild medial compartment degenerative changes, however I do not suspect that this is contributing to her current pain. - Discussed conservative management with melenic daily for 1 week, then as needed daily after that. -Rest, ice, and elevation. -Discussed return precautions with the patient.

## 2016-06-07 ENCOUNTER — Other Ambulatory Visit: Payer: Self-pay | Admitting: Orthopedic Surgery

## 2016-06-07 DIAGNOSIS — M65332 Trigger finger, left middle finger: Secondary | ICD-10-CM | POA: Diagnosis not present

## 2016-06-08 ENCOUNTER — Other Ambulatory Visit: Payer: Self-pay | Admitting: Student in an Organized Health Care Education/Training Program

## 2016-06-08 DIAGNOSIS — E119 Type 2 diabetes mellitus without complications: Secondary | ICD-10-CM

## 2016-06-08 DIAGNOSIS — Z794 Long term (current) use of insulin: Principal | ICD-10-CM

## 2016-06-08 MED ORDER — INSULIN ASPART 100 UNIT/ML ~~LOC~~ SOLN: SUBCUTANEOUS | 0 refills | Status: DC

## 2016-06-08 NOTE — Telephone Encounter (Signed)
Spoke with pharmacist who said the patient picked up this Rx  on Dec 1.   Thanks, Ander Purpura

## 2016-06-10 ENCOUNTER — Other Ambulatory Visit: Payer: Self-pay | Admitting: Student in an Organized Health Care Education/Training Program

## 2016-06-10 DIAGNOSIS — E7800 Pure hypercholesterolemia, unspecified: Secondary | ICD-10-CM

## 2016-06-12 ENCOUNTER — Encounter: Payer: Self-pay | Admitting: Internal Medicine

## 2016-06-12 ENCOUNTER — Ambulatory Visit (INDEPENDENT_AMBULATORY_CARE_PROVIDER_SITE_OTHER): Payer: Medicare Other | Admitting: Internal Medicine

## 2016-06-12 DIAGNOSIS — M25562 Pain in left knee: Secondary | ICD-10-CM

## 2016-06-12 MED ORDER — KETOROLAC TROMETHAMINE 60 MG/2ML IM SOLN
60.0000 mg | Freq: Once | INTRAMUSCULAR | Status: AC
  Administered 2016-06-12: 60 mg via INTRAMUSCULAR

## 2016-06-12 NOTE — Progress Notes (Signed)
   Subjective:    Alicia [name] - 60 y.o. female MRN [id number]  Date of birth: 1956-03-27  HPI  Alicia [name] is here for left knee pain.  Left knee pain: Patient reports chronic knee pain that she recently saw PCP for. Was diagnosed with Pes Anserine bursitis at that time. However, pain worsened acutely after falling down three snow covered steps this weekend. She landed with her left leg bent underneath her. She denies sensation of popping or something out of place after the fall. She has had some bruising on her left lower leg. Since then she has had difficulty bearing weight and and states she has to walk straight legged.  -  reports that she has never smoked. She has never used smokeless tobacco. - Review of Systems: Per HPI. - Past Medical History: Patient Active Problem List   Diagnosis Date Noted  . Knee pain, left 06/12/2016  . Lumbar strain, initial encounter 05/05/2016  . Injury of left toe 05/05/2016  . Abdominal pain, left upper quadrant 04/10/2016  . Difficulty sleeping 03/07/2016  . Healthcare maintenance 03/07/2016  . Pes anserine bursitis 12/21/2015  . Statin myopathy 01/21/2015  . Falls 12/08/2014  . New onset of headaches after age 60 12/08/2014  . Ataxia 11/13/2014  . Bilateral thigh pain 10/28/2014  . Myalgia 09/28/2014  . Seizure ([place]) 06/08/2014  . Trigger thumb of left hand 02/06/2014  . Carpal tunnel syndrome 01/05/2014  . Diabetic retinopathy ([place]) 07/21/2011  . Osteoarthritis of carpometacarpal joint of thumb 04/07/2011  . Gastroparesis 04/15/2010  . Asthma 04/04/2010  . ARTHRITIS, KNEE 09/17/2007  . Diabetic polyneuropathy ([place]) 08/30/2006  . Type 2 diabetes mellitus ([place]) 08/30/2006  . HYPERCHOLESTEROLEMIA 08/30/2006  . HEARING LOSS NOS OR DEAFNESS 08/30/2006  . Essential HTN 08/30/2006  . Allergic rhinitis 08/30/2006   - Medications: reviewed and updated   Objective:   Physical Exam BP (!) 124/58   Pulse 91   Temp 97.8 F (36.6 C)  (Oral)   LMP  (Exact Date)   SpO2 97%  Gen: NAD, alert, sitting in wheelchair  MSK: Ecchymosis over left shin. Left knee without erythema or warmth. ? Suprapatellar edema on left compared to right. ROM of left knee limited in flexion and extension due to pain. TTP over the medial joint line. Pain with valgus and varus stress testing of left knee. No pain with patellar compression. Unable to appreciate any ligament laxity but exam limited due to pain.  Neuro: Patient able to stand. Able to take a few small steps with pain and limp on left side.     Assessment & Plan:   Knee pain, left Acute on chronic left knee pain secondary to fall. Given difficulty with walking and bearing weight along with joint line tenderness and pain with valgus/varus stress testing recommended patient go to ortho urgent care for further evaluation, particularly for evaluation of meniscal tear. Appointment scheduled for this afternoon. Elected not to order X-rays given that ortho office has x-ray on site. Toradol shot given in office today. Continue NSAIDs prn. If having stomach pain with NSAIDs, recommended OTC acid blocker.     Phill Myron, D.O. 06/12/2016, 11:57 AM PGY-2, [place]

## 2016-06-12 NOTE — Addendum Note (Signed)
Addended by: Londell Moh T on: 06/12/2016 04:02 PM   Modules accepted: Orders

## 2016-06-12 NOTE — Patient Instructions (Signed)
Please go to your appointment at [facility] so that they can evaluate your knee.   If you continue with anti-inflammatory medications (Mobic, Ibuprofen) I would recommend taking an over the counter acid blocking medication.   Hope you feel better.   Dr. Juleen China

## 2016-06-12 NOTE — Assessment & Plan Note (Addendum)
Acute on chronic left knee pain secondary to fall. Given difficulty with walking and bearing weight along with joint line tenderness and pain with valgus/varus stress testing recommended patient go to ortho urgent care for further evaluation, particularly for evaluation of meniscal tear. Appointment scheduled for this afternoon. Elected not to order X-rays given that ortho office has x-ray on site. Toradol shot given in office today. Continue NSAIDs prn. If having stomach pain with NSAIDs, recommended OTC acid blocker.

## 2016-06-14 DIAGNOSIS — S82002A Unspecified fracture of left patella, initial encounter for closed fracture: Secondary | ICD-10-CM | POA: Diagnosis not present

## 2016-06-19 ENCOUNTER — Other Ambulatory Visit: Payer: Self-pay | Admitting: Student in an Organized Health Care Education/Training Program

## 2016-06-19 DIAGNOSIS — M792 Neuralgia and neuritis, unspecified: Secondary | ICD-10-CM

## 2016-06-21 ENCOUNTER — Other Ambulatory Visit: Payer: Self-pay | Admitting: *Deleted

## 2016-06-21 DIAGNOSIS — I10 Essential (primary) hypertension: Secondary | ICD-10-CM

## 2016-06-22 MED ORDER — HYDROCHLOROTHIAZIDE 50 MG PO TABS: 50.0000 mg | ORAL_TABLET | Freq: Every day | ORAL | 3 refills | Status: DC

## 2016-07-07 DIAGNOSIS — S82002D Unspecified fracture of left patella, subsequent encounter for closed fracture with routine healing: Secondary | ICD-10-CM | POA: Diagnosis not present

## 2016-07-10 ENCOUNTER — Encounter (HOSPITAL_BASED_OUTPATIENT_CLINIC_OR_DEPARTMENT_OTHER): Payer: Self-pay

## 2016-07-10 NOTE — Progress Notes (Signed)
CC: Diabetes follow up  HPI: Alicia [name] is a 61 y.o. female with multiple medical issues including hearing loss, PMH HTN, asthma, T2DM, HLD who presents to [facility] for diabetes follow up.  All history obtained with the help of an ASL interpreter.  Diabetes A1c previously 8.6 in 04/2016, improved today at 8.2 though not at goal. - Endorses taking metformin BID, Lantus 11 units at bedtime (charted as 6 units on med list), as well as Sliding scale with lunch and dinner  though she seems unsure of the scale, reports the following: >150 1 unit >250 3 units >300 4 units - Patient does go for ophthalmology exam every 6 months, has appt scheduled for March - AM fasting sugars 269 this morning, 127 yesterday. Lowest she's seen is 60, which happened once - No AM lows in the past month - With low sugars, endorses symptoms including palpitations and diaphoresis, changes in vision - Otherwise, no blurry vision, no chest pain or palpitations, does endorse loose/soft stools, no constipation, no changes in urination, +nocturia (gets up 2-3 times during night), no numbness or tingling in her extremities  Stomach Pain - Has bothered her over the last week, diffuse pain, worse in epigastric region, crampy, does not radiate, not related to eating - patient notes she has been taking meloxicam 1-2x daily for knee pain (Has knee MRI scheduled for tomorrow) - feels stomach pain coincides with taking this medication - no hematochezia or melena, has regular bowel movements, no changes in urination  Review of Symptoms:  See HPI for ROS.   CC, SH/smoking status, and VS noted.  Objective: BP 128/76   Pulse 89   Temp 98 F (36.7 C) (Oral)   Ht 5\' 4"  (1.626 m)   Wt 80.5 kg (177 lb 6.4 oz)   LMP  (Exact Date)   BMI 30.45 kg/m  GEN: NAD, alert, cooperative, and pleasant. EYE: no conjunctival injection, R>L pupil chronically related to childhood meningitis and nerve damage ENMT: normal tympanic light  reflex, no nasal polyps,no rhinorrhea, no pharyngeal erythema or exudates NECK: full ROM, no thyromegally RESPIRATORY: clear to auscultation bilaterally with no wheezes, rhonchi or rales, good effort CV: RRR, no m/r/g, no peripheral edema GI: non-surgical abdomen: soft and non-tender, non-distended, no rigidity or guarding, normoactive bowel sounds, no hepatosplenomegaly SKIN: warm and dry, no rashes or lesions  Assessment and plan:  Type 2 diabetes mellitus (HCC) - Reports metformin, 11u lantus nightly and a sliding scale with lunch and dinner - she is unclear on the sliding scale. Thinks she takes 1 unit for >150, 3 units for >250, 4 units for >300. - Reports 60 AM glucose one time, last two mornings CBGs were 269, 127 - Consider transitioning from sliding scale regimen to long acting insulin and oral meds, consider lantus qAM rather than qHS - Given complex history and unusual regimen, as well as need for diabetes education, will refer to Dr. Valentina Lucks for diabetes education and regimen adjustment - Will keep current regimen for now, asked patient to schedule pharm follow up on her way out today.  Abdominal pain, epigastric - may be related to meloxicam use - encouraged patient to use meloxicam only once daily (has 15 mg tabs at home - has been taking BID) - will reorder PPI while she uses this medication (has MRI for knee pain tomorrow, followed by orthopedics) - no red flags, return precautions provided   Orders Placed This Encounter  Procedures  . HgB A1c  Meds ordered this encounter  Medications  . pantoprazole (PROTONIX) 40 MG tablet    Sig: Take 1 tablet (40 mg total) by mouth daily.    Dispense:  30 tablet    Refill:  0    Everrett Coombe, MD,MS,  PGY1 07/11/2016 3:14 PM

## 2016-07-11 ENCOUNTER — Encounter: Payer: Self-pay | Admitting: Student in an Organized Health Care Education/Training Program

## 2016-07-11 ENCOUNTER — Ambulatory Visit (HOSPITAL_BASED_OUTPATIENT_CLINIC_OR_DEPARTMENT_OTHER): Admit: 2016-07-11 | Payer: Medicare Other | Admitting: Orthopedic Surgery

## 2016-07-11 ENCOUNTER — Encounter (HOSPITAL_BASED_OUTPATIENT_CLINIC_OR_DEPARTMENT_OTHER): Payer: Self-pay

## 2016-07-11 ENCOUNTER — Ambulatory Visit (INDEPENDENT_AMBULATORY_CARE_PROVIDER_SITE_OTHER): Payer: Medicare Other | Admitting: Student in an Organized Health Care Education/Training Program

## 2016-07-11 VITALS — BP 128/76 | HR 89 | Temp 98.0°F | Ht 64.0 in | Wt 177.4 lb

## 2016-07-11 DIAGNOSIS — E119 Type 2 diabetes mellitus without complications: Secondary | ICD-10-CM | POA: Diagnosis not present

## 2016-07-11 DIAGNOSIS — R1013 Epigastric pain: Secondary | ICD-10-CM | POA: Insufficient documentation

## 2016-07-11 DIAGNOSIS — Z794 Long term (current) use of insulin: Secondary | ICD-10-CM

## 2016-07-11 LAB — POCT GLYCOSYLATED HEMOGLOBIN (HGB A1C): Hemoglobin A1C: 8.2

## 2016-07-11 SURGERY — RELEASE, A1 PULLEY, FOR TRIGGER FINGER
Anesthesia: Regional | Laterality: Left

## 2016-07-11 MED ORDER — PANTOPRAZOLE SODIUM 40 MG PO TBEC: 40.0000 mg | DELAYED_RELEASE_TABLET | Freq: Every day | ORAL | 0 refills | Status: DC

## 2016-07-11 NOTE — Assessment & Plan Note (Addendum)
-   Reports metformin, 11u lantus nightly and a sliding scale with lunch and dinner - she is unclear on the sliding scale. Thinks she takes 1 unit for >150, 3 units for >250, 4 units for >300. - Reports 60 AM glucose one time, last two mornings CBGs were 269, 127 - Consider transitioning from sliding scale regimen to long acting insulin and oral meds, consider lantus qAM rather than qHS - Given complex history and unusual regimen, as well as need for diabetes education, will refer to Dr. Valentina Lucks for diabetes education and regimen adjustment - Will keep current regimen for now, asked patient to schedule pharm follow up on her way out today.

## 2016-07-11 NOTE — Assessment & Plan Note (Addendum)
-   may be related to meloxicam use - encouraged patient to use meloxicam only once daily (has 15 mg tabs at home - has been taking BID) - will reorder PPI while she uses this medication (has MRI for knee pain tomorrow, followed by orthopedics) - no red flags, return precautions provided

## 2016-07-11 NOTE — Patient Instructions (Addendum)
It was a pleasure seeing you today in our clinic. Today we discussed your diabetes. Here is the treatment plan we have discussed and agreed upon together: - Please schedule an appointment on your way out with Dr. Valentina Lucks, our pharmacist, for adjustment in your diabetes medication.  Diabetes Your diabetes is not yet well controlled Your goal is to have an A1c < 7.0 Medicine Changes: Please follow up with Dr. Valentina Lucks, schedule an appointment on your way out for your earliest convenience for diabetes medication adjustment and medication education.  Abdominal Pain - You have pain with taking meloxicam, which can be upsetting to the stomach - Please use this as a once-per-day medication as you need it for knee pain - Please take protonix to help protect your stomach  Our clinic's number is [phone]. Please call with questions or concerns about what we discussed today.  Be well, Dr. Burr Medico

## 2016-07-12 DIAGNOSIS — M25562 Pain in left knee: Secondary | ICD-10-CM | POA: Diagnosis not present

## 2016-07-13 ENCOUNTER — Encounter: Payer: Self-pay | Admitting: Pharmacist

## 2016-07-13 ENCOUNTER — Ambulatory Visit (INDEPENDENT_AMBULATORY_CARE_PROVIDER_SITE_OTHER): Payer: Medicare Other | Admitting: Pharmacist

## 2016-07-13 DIAGNOSIS — E7800 Pure hypercholesterolemia, unspecified: Secondary | ICD-10-CM | POA: Diagnosis not present

## 2016-07-13 DIAGNOSIS — I10 Essential (primary) hypertension: Secondary | ICD-10-CM | POA: Diagnosis not present

## 2016-07-13 DIAGNOSIS — Z794 Long term (current) use of insulin: Secondary | ICD-10-CM

## 2016-07-13 DIAGNOSIS — E119 Type 2 diabetes mellitus without complications: Secondary | ICD-10-CM | POA: Diagnosis present

## 2016-07-13 DIAGNOSIS — R1310 Dysphagia, unspecified: Secondary | ICD-10-CM | POA: Insufficient documentation

## 2016-07-13 MED ORDER — DULAGLUTIDE 0.75 MG/0.5ML ~~LOC~~ SOAJ: 0.7500 mg | SUBCUTANEOUS | 1 refills | Status: DC

## 2016-07-13 MED ORDER — HYDROCHLOROTHIAZIDE 25 MG PO TABS: 25.0000 mg | ORAL_TABLET | Freq: Every day | ORAL | 1 refills | Status: DC

## 2016-07-13 MED ORDER — CARVEDILOL 6.25 MG PO TABS: 6.2500 mg | ORAL_TABLET | Freq: Two times a day (BID) | ORAL | 1 refills | Status: DC

## 2016-07-13 MED ORDER — INSULIN GLARGINE 100 UNIT/ML ~~LOC~~ SOLN: 8.0000 [IU] | Freq: Every day | SUBCUTANEOUS | 3 refills | Status: DC

## 2016-07-13 MED ORDER — CARVEDILOL 3.125 MG PO TABS: 3.1250 mg | ORAL_TABLET | Freq: Two times a day (BID) | ORAL | 2 refills | Status: DC

## 2016-07-13 NOTE — Assessment & Plan Note (Signed)
Diabetes longstanding diagnosed currently uncontrolled. Patient reports nocturnal hypoglycemic events and is able to discuss appropriate hypoglycemia management plan. Patient reports adherence with medication. Control is suboptimal due to insulin resistance.  Discussed foot exams and importance of wearing shoes around the house.   -Continued basal insulin Lantus (insulin glargine) 8 units QHS. -Continued rapid insulin Novolog (insulin aspart) 3 units before lunch and supper.  -Started Trulicity (dulaglutide) to 0.75 mg once weekly.  -Next A1C anticipated 3 months.  Instructed her to call clinic if she experiences increase in hypoglycemia  ASCVD risk greater than 7.5%. Continued Aspirin 81 mg and Continued rosuvastatin 5 mg every other day.  Patient has history of statin intolerance and no recent lipid panel.   -Will obtain lipid panel at next visit with Dr Burr Medico as patient has meeting to get to today.   -Patient may be candidate for exetomibe pending LDL.     Hypertension longstanding diagnosed currently controlled but patient having palpitation/tachycardia and is also complaining of leg cramps.  Patient reports adherence with medication. Patient was examined today for palpitations/tachycardia by Dr Nori Riis.  -Decreased dose of HCTZ to 25 mg daily -Stopped Metoprolol.  Start Carvedilol 6.25 mg BID.  -Will obtain BMET in 1-2 weeks at Dr Lewayne Bunting visit  GERD/trouble swallowing longstanding currently uncontrolled with patient complaining of difficulty swallowing large capsules and some food having difficulty going down.  Patient not currently taking Pantoprazole.   -Restart Pantoprazole 40 mg daily

## 2016-07-13 NOTE — Assessment & Plan Note (Signed)
Hypertension longstanding diagnosed currently controlled but patient having palpitation/tachycardia and is also complaining of leg cramps.  Patient reports adherence with medication. Patient was examined today for palpitations/tachycardia by Dr Nori Riis.  -Decreased dose of HCTZ to 25 mg daily -Stopped Metoprolol.  Start Carvedilol 6.25 mg BID.  -Will obtain BMET in 1-2 weeks at Dr Lewayne Bunting visit

## 2016-07-13 NOTE — Progress Notes (Signed)
S:    Patient arrives in good spirits ambulating without assistance and accompanied by a sign language interpretor Cathy.  Presents for diabetes evaluation, education, and management at the request of Dr Burr Medico. Patient was referred on 07/11/16.  Patient was last seen by Primary Care Provider on 07/11/16.  She fell on the snow 3 weeks ago and is having knee problems.  She is awaiting results of an MRI.     Patient reports adherence with medications.  Current diabetes medications include: Metformin 1000 mg BID, Lantus 11 units QHS, Novolog 3 units with lunch and supper Current hypertension medications include: metoprolol succinate 50 mg daily, HCTZ 50 mg daily, losartan 100 mg daily  Patient reports hypoglycemic events. Reports nocturnal hypoglycemia 3 times a month. Reports hypoglycemia during the day is much less frequent.   Patient reported dietary habits: Eats 3 meals/day. Reports sometimes making low carbohydrate food choices.  Is looking at nutritional labels most of the time and tries to follow.  Is using sugar free most of the time.    Patient reported exercise habits: Has been walking [place] prior to her knee being injured   Patient reports nocturia 2x/night.  Patient denies neuropathy. Patient denies visual changes.   Patient reports self foot exams. Denies areas of concern.    Checking blood glucose 4 times per day.  Reports fasting blood glucose 80-200 mg/dL but today was 89 mg/dL   Reports chest palpitations which she states feels faster at times but is rather persistent.  She also complains of leg/foot cramps.    Does report some trouble swallowing but she states it is hard to swallow pills and feels like food has difficulty going down.   O:  Lab Results  Component Value Date   HGBA1C 8.2 07/11/2016   Vitals:   07/13/16 0917  BP: 138/70  Pulse: 90    A/P: Diabetes longstanding diagnosed currently uncontrolled. Patient reports nocturnal hypoglycemic events and is  able to discuss appropriate hypoglycemia management plan. Patient reports adherence with medication. Control is suboptimal due to insulin resistance.  Discussed foot exams and importance of wearing shoes around the house.   -Continued basal insulin Lantus (insulin glargine) 8 units QHS. -Continued rapid insulin Novolog (insulin aspart) 3 units before lunch and supper.  -Started Trulicity (dulaglutide) to 0.75 mg once weekly.  -Next A1C anticipated 3 months.  Instructed her to call clinic if she experiences increase in hypoglycemia  ASCVD risk greater than 7.5%. Continued Aspirin 81 mg and Continued rosuvastatin 5 mg every other day.  Patient has history of statin intolerance and no recent lipid panel.   -Will obtain lipid panel at next visit with Dr Burr Medico as patient has meeting to get to today.   -Patient may be candidate for exetomibe pending LDL.     Hypertension longstanding diagnosed currently controlled but patient having palpitation/tachycardia and is also complaining of leg cramps.  Patient reports adherence with medication. Patient was examined today for palpitations/tachycardia by Dr Nori Riis.  -Decreased dose of HCTZ to 25 mg daily -Stopped Metoprolol.  Start Carvedilol 6.25 mg BID.  -Will obtain BMET in 1-2 weeks at Dr Lewayne Bunting visit  GERD/trouble swallowing longstanding currently uncontrolled with patient complaining of difficulty swallowing large capsules and some food having difficulty going down.  Patient not currently taking Pantoprazole.   -Restart Pantoprazole 40 mg daily  Written patient instructions provided.  Total time in face to face counseling 60 minutes.   Follow up in Pharmacist Clinic Visit in  5-6 weeks. Followup palpitations/tachycardia/trouble swallowing with Dr Burr Medico in 1-2 weeks.   Patient seen with Bennye Alm, PharmD, [place] PGY2 Pharmacy Resident.

## 2016-07-13 NOTE — Patient Instructions (Addendum)
Decrease Lantus to 8 units at bedtime Continue Novolog 3 units before lunch and supper.    Start Trulicity [id number] mg injection once weekly  Please let clinic know if you have more signs or symptoms of low blood sugars  Decrease Hydrochlorothiazide (HCTZ) from 50 mg daily to 25 mg daily Start Carvedilol 6.25 mg twice daily Stop Metoprolol   Restart Pantoprazole 40 mg once daily.  Chew up your food more and drink more water while eating.    Followup with Dr Burr Medico in 1-2 weeks (before the end of January) Followup with Dr Valentina Lucks in 4-6 weeks

## 2016-07-13 NOTE — Progress Notes (Signed)
Patient ID: Alicia [name], female   DOB: 12/07/1955, 60 y.o.   MRN: [id number] Reviewed: Agree with Dr. Koval's documentation and management. 

## 2016-07-13 NOTE — Assessment & Plan Note (Signed)
Diabetes longstanding diagnosed currently uncontrolled. Patient reports nocturnal hypoglycemic events and is able to discuss appropriate hypoglycemia management plan. Patient reports adherence with medication. Control is suboptimal due to insulin resistance.  Discussed foot exams and importance of wearing shoes around the house.   -Continued basal insulin Lantus (insulin glargine) 8 units QHS. -Continued rapid insulin Novolog (insulin aspart) 3 units before lunch and supper.  -Started Trulicity (dulaglutide) to 0.75 mg once weekly.  -Next A1C anticipated 3 months.  Instructed her to call clinic if she experiences increase in hypoglycemia

## 2016-07-13 NOTE — Assessment & Plan Note (Signed)
ASCVD risk greater than 7.5%. Continued Aspirin 81 mg and Continued rosuvastatin 5 mg every other day.  Patient has history of statin intolerance and no recent lipid panel.   -Will obtain lipid panel at next visit with Dr Burr Medico as patient has meeting to get to today.   -Patient may be candidate for exetomibe pending LDL.

## 2016-07-18 DIAGNOSIS — M25562 Pain in left knee: Secondary | ICD-10-CM | POA: Diagnosis not present

## 2016-07-24 ENCOUNTER — Other Ambulatory Visit: Payer: Self-pay | Admitting: Family Medicine

## 2016-07-24 DIAGNOSIS — E119 Type 2 diabetes mellitus without complications: Secondary | ICD-10-CM

## 2016-07-24 DIAGNOSIS — Z794 Long term (current) use of insulin: Principal | ICD-10-CM

## 2016-07-26 ENCOUNTER — Other Ambulatory Visit: Payer: Self-pay | Admitting: Family Medicine

## 2016-07-27 NOTE — Progress Notes (Signed)
   CC: Diabetes follow up  HPI: Alicia [name] is a 61 y.o. female with PMH significant for Diabetes, deafness, HTN, HLD who presents to [facility] today for follow up of chronic conditions.   Diabetes - Lantus 8 units QHS, Novolog 3-4 units before lunch and supper, and recenlty started Trulicity A999333 mg once weekly on Saturdays. She thinks the Trulicity may have caused some nausea and vomiting which have improved. - Blood Sugar Ranges: 100-250 - Polyuria: no  - Visual problems: no, other than feels more sensitive to light - Medication Compliance: yes  - Hypoglycemia: asymptomatic hypoglycemia on Tues/Wed, had vomited that day - [place] Exam: UTD  Foot Exam: UTD  HLD - ASA 81 mg and rosuvostatin 5 mg every other day - denies CP or dyspnea at rest or on exertion  HTN - HCTZ 25 mg qd, carvedolol 6.25 BID, Losartan 100 mg daily - denies dizziness or headaches, no light-headedness, no vision changes, no CP  Review of Symptoms:  See HPI for ROS.   CC, SH/smoking status, and VS noted.  Objective: BP 115/60 (BP Location: Right Arm, Patient Position: Sitting, Cuff Size: Normal)   Pulse (!) 101   Temp 97.6 F (36.4 C) (Oral)   Ht 5\' 4"  (1.626 m)   Wt 173 lb 9.6 oz (78.7 kg)   SpO2 97%   BMI 29.80 kg/m  GEN: NAD, alert, cooperative, and pleasant. EYE: no conjunctival injection,R>L pupil (chronic) RESPIRATORY: clear to auscultation bilaterally with no wheezes, rhonchi or rales, good effort CV: RRR, no m/r/g, no peripheral edema GI: soft, non-tender, non-distended, normoactive bowel sounds, no hepatosplenomegaly  Assessment and plan:  HYPERCHOLESTEROLEMIA - ASCVD risk 6.7% based on 2015 lipid panel, will get lipid panel today - continue rosuvostatin, only taken every other day due to previous poor tolerance - continue ASA 81 mg daily   Type 2 diabetes mellitus (HCC) - continue current regimen, lantus 8u QHS, Novolog 3u before lunch and supper, Trulicity A999333  mg once weekly, refilled today. Though patient experiences some high AM CBGs (250), will not make changes to current regimen given recent hypoglycemic episode of 54 on a day when she had vomited and not eaten. - hypoglycemic symptoms and rescue discussed with patient. - HbA1c due ~ 10/09/2016 - patient is compliant with ophtho exams every 6 months, asked her to have this report sent to [name] after her next ophtho appt in march - BMP today  Essential HTN Well-controlled.  - Continue carvedolol, losartan, and HCTZ - BMP today   Orders Placed This Encounter  Procedures  . BASIC METABOLIC PANEL WITH GFR  . Lipid panel    Meds ordered this encounter  Medications  . Dulaglutide (TRULICITY) A999333 0000000 SOPN    Sig: Inject 0.75 mg into the skin once a week.    Dispense:  4 pen    Refill:  1     Everrett Coombe, MD,MS,  PGY1 07/28/2016 4:58 PM

## 2016-07-28 ENCOUNTER — Ambulatory Visit (INDEPENDENT_AMBULATORY_CARE_PROVIDER_SITE_OTHER): Payer: Medicare Other | Admitting: Student in an Organized Health Care Education/Training Program

## 2016-07-28 ENCOUNTER — Encounter: Payer: Self-pay | Admitting: Student in an Organized Health Care Education/Training Program

## 2016-07-28 DIAGNOSIS — E7800 Pure hypercholesterolemia, unspecified: Secondary | ICD-10-CM

## 2016-07-28 DIAGNOSIS — Z794 Long term (current) use of insulin: Secondary | ICD-10-CM

## 2016-07-28 DIAGNOSIS — E119 Type 2 diabetes mellitus without complications: Secondary | ICD-10-CM

## 2016-07-28 DIAGNOSIS — I10 Essential (primary) hypertension: Secondary | ICD-10-CM | POA: Diagnosis not present

## 2016-07-28 MED ORDER — DULAGLUTIDE 0.75 MG/0.5ML ~~LOC~~ SOAJ: 0.7500 mg | SUBCUTANEOUS | 1 refills | Status: DC

## 2016-07-28 NOTE — Assessment & Plan Note (Signed)
Well-controlled.  - Continue carvedolol, losartan, and HCTZ - BMP today

## 2016-07-28 NOTE — Assessment & Plan Note (Addendum)
-   ASCVD risk 6.7% based on 2015 lipid panel, will get lipid panel today - continue rosuvostatin, only taken every other day due to previous poor tolerance - continue ASA 81 mg daily

## 2016-07-28 NOTE — Patient Instructions (Addendum)
It was a pleasure seeing you today in our clinic. Today we discussed diabetes. Here is the treatment plan we have discussed and agreed upon together:  Diabetes - Continue to take 8 units insulin at night, 3 units of insulin with lunch and supper, and Trulicity once weekly on saturdays - we will do blood work today and I will send you a letter with your results - follow up 10/2016 for your next HbA1c or sooner as needed   Our clinic's number is [phone]. Please call with questions or concerns about what we discussed today.  Be well, Dr. Burr Medico

## 2016-07-28 NOTE — Assessment & Plan Note (Addendum)
-   continue current regimen, lantus 8u QHS, Novolog 3u before lunch and supper, Trulicity A999333 mg once weekly, refilled today. Though patient experiences some high AM CBGs (250), will not make changes to current regimen given recent hypoglycemic episode of 54 on a day when she had vomited and not eaten. - hypoglycemic symptoms and rescue discussed with patient. - HbA1c due ~ 10/09/2016 - patient is compliant with ophtho exams every 6 months, asked her to have this report sent to [name] after her next ophtho appt in march - BMP today

## 2016-07-29 LAB — LIPID PANEL
Cholesterol: 184 mg/dL (ref <200)
HDL: 81 mg/dL (ref >50)
LDL Cholesterol: 91 mg/dL (ref <100)
Total CHOL/HDL Ratio: 2.3 Ratio (ref <5.0)
Triglycerides: 59 mg/dL (ref <150)
VLDL: 12 mg/dL (ref <30)

## 2016-07-29 LAB — BASIC METABOLIC PANEL WITH GFR
BUN: 13 mg/dL (ref 7–25)
CO2: 24 mmol/L (ref 20–31)
Calcium: 10.1 mg/dL (ref 8.6–10.4)
Chloride: 98 mmol/L (ref 98–110)
Creat: 0.75 mg/dL (ref 0.50–0.99)
GFR, Est African American: >89 mL/min (ref >=60)
GFR, Est Non African American: 87 mL/min (ref >=60)
Glucose, Bld: 217 mg/dL — ABNORMAL HIGH (ref 65–99)
Potassium: 3.7 mmol/L (ref 3.5–5.3)
Sodium: 140 mmol/L (ref 135–146)

## 2016-08-02 ENCOUNTER — Encounter: Payer: Self-pay | Admitting: Student in an Organized Health Care Education/Training Program

## 2016-08-08 DIAGNOSIS — M25562 Pain in left knee: Secondary | ICD-10-CM | POA: Diagnosis not present

## 2016-08-10 ENCOUNTER — Encounter: Payer: Self-pay | Admitting: Pharmacist

## 2016-08-10 ENCOUNTER — Ambulatory Visit (INDEPENDENT_AMBULATORY_CARE_PROVIDER_SITE_OTHER): Payer: Medicare Other | Admitting: Pharmacist

## 2016-08-10 DIAGNOSIS — E119 Type 2 diabetes mellitus without complications: Secondary | ICD-10-CM

## 2016-08-10 DIAGNOSIS — R51 Headache: Secondary | ICD-10-CM

## 2016-08-10 DIAGNOSIS — Z794 Long term (current) use of insulin: Secondary | ICD-10-CM

## 2016-08-10 DIAGNOSIS — G8929 Other chronic pain: Secondary | ICD-10-CM

## 2016-08-10 DIAGNOSIS — I10 Essential (primary) hypertension: Secondary | ICD-10-CM

## 2016-08-10 DIAGNOSIS — E7800 Pure hypercholesterolemia, unspecified: Secondary | ICD-10-CM

## 2016-08-10 DIAGNOSIS — R519 Headache, unspecified: Secondary | ICD-10-CM | POA: Insufficient documentation

## 2016-08-10 MED ORDER — BASAGLAR KWIKPEN 100 UNIT/ML ~~LOC~~ SOPN: 8.0000 [IU] | PEN_INJECTOR | Freq: Every day | SUBCUTANEOUS | 3 refills | Status: DC

## 2016-08-10 MED ORDER — DULAGLUTIDE 0.75 MG/0.5ML ~~LOC~~ SOAJ: 0.7500 mg | SUBCUTANEOUS | 3 refills | Status: DC

## 2016-08-10 MED ORDER — CARVEDILOL 12.5 MG PO TABS: 12.5000 mg | ORAL_TABLET | Freq: Two times a day (BID) | ORAL | 2 refills | Status: DC

## 2016-08-10 MED ORDER — METFORMIN HCL 1000 MG PO TABS: 1000.0000 mg | ORAL_TABLET | Freq: Two times a day (BID) | ORAL | 11 refills | Status: DC

## 2016-08-10 MED ORDER — HYDROCHLOROTHIAZIDE 12.5 MG PO TABS: 12.5000 mg | ORAL_TABLET | Freq: Every day | ORAL | 3 refills | Status: DC

## 2016-08-10 NOTE — Assessment & Plan Note (Signed)
Hypertension longstanding diagnosed currently controlled.  Patient continues to have leg/calf cramps and also is having some chest palpitations and headaches.  Patient reports adherence with medication but was only taking Carvedilol once daily. Will increase carvedilol to 12.5 mg BID to assist with palpitaitons, heart rate, and hopefully prophylaxis for headaches.  Will decrease HCTZ to 12.5 mg daily to attempt to reduce leg cramps.

## 2016-08-10 NOTE — Assessment & Plan Note (Signed)
Diabetes longstanding diagnosed currently uncontrolled but with improved CBGs. Patient reports 1 hypoglycemic events and is able to verbalize appropriate hypoglycemia management plan. Patient reports adherence with medication. Control is suboptimal due to insulin resistance. Switched basal insulin from Lantus (insulin glargine) to Basaglar 8 units QHS due to formulary changes with her insurance. Continue rapid insulin Novolog (insulin aspart) 3-4 units before meals. Continue Trulicity (dulaglutide) to 0.75 mg once weekly. Continue metformin 1000 mg BID.  Refill has been sent to her pharmacy. Discussed signs/symptoms/treatment of hypoglycemia.  Encouraged to call clinic if she experiences.  Continue checking CBGs 4x/day and bring meter to next visit. Next A1C anticipated 10/2016.

## 2016-08-10 NOTE — Assessment & Plan Note (Signed)
Headaches: Patient reports ongoing headaches which she takes Goody Powders 2-3x/week. Have instructed patient to stop goody powders and to try meloxicam 30 mg as needed to see if this assists with the headaches.  Also have increased carvedilol to 12.5 mg BID for migraine/headache prophylaxis.

## 2016-08-10 NOTE — Patient Instructions (Addendum)
Will switch Lantus to Basaglar 8 units daily (due to insurance)  Increase Carvedilol (Coreg) to 12.5 mg twice daily (take two of 6.25 mg tablets twice daily until you run out) Decrease Hydrochlorothiazide (HCTZ) to 12.5 mg daily   Please stop Goody's Powders On days you have a headache you may take 2 meloxicam to see if this helps with the headaches  Please bring your meter to the next visit  Followup with Dr Valentina Lucks in 1 month

## 2016-08-10 NOTE — Progress Notes (Addendum)
S:    Patient arrives in good spirits with assistance of an interpretor.  Presents for diabetes evaluation, education, and management at the request of Dr Burr Medico. Patient was referred on 07/11/16 and was last seen in pharmacy clinic on 07/13/16.  Patient was last seen by Primary Care Provider on 07/28/16.   Today she states she has had a headache which she takes [place] powders 2-3 times per week.  She denies light sensitivity with the headaches.  She reports less palpitations since switching to carvedilol.  She also reports some N/V when starting Trulicity for the first week but has tolerated it well since.    Patient reports adherence with medications. She reports she did have her carvedilol this morning.  Current diabetes medications include:metformin 1000 mg BID, Lantus 8 units daily, Novolog 3-4 units before meals, Trulicity A999333 mg weekly  Current hypertension medications include: losartan 100 mg daily, HCTZ 25 mg daily, Carvedilol 6.25 mg daily (Prescribed BID but patient misunderstood directions)   Patient reports 1 hypoglycemic events <70 with a CBG of 40.  She states she did not eat the previous night.  She treated with regular soda.    Patient reported dietary habits: Eats 2 meals/day.  Mostly skips breakfast.   States she is reading nutrition labels and making good low carbohydrate food choices.   Patient reported exercise habits: Starting therapy tomorrow for knee.    Patient reports nocturia 2x/night.  Patient denies neuropathy. Patient reports visual changes with one episode of blurred vision but with normal CBG of 110 on this occasion. Patient reports self foot exams. Denies changes.  Is wearing shoes and socks at home.    Reported CBGs (did not bring meter to clinic today)  Highest: One value in 300s. Some in 280s. Seeing a lot of 75-200s in the morning.  Mostly less than 100. Currently Checking CBGs 4 times per day.    Does report continuing leg/calf cramps when she wakes up  several days per week.   O:  Lab Results  Component Value Date   HGBA1C 8.2 07/11/2016   Vitals:   08/10/16 0921  BP: 140/80  Pulse: 96    A/P: Diabetes longstanding diagnosed currently uncontrolled but with improved CBGs. Patient reports 1 hypoglycemic events and is able to verbalize appropriate hypoglycemia management plan. Patient reports adherence with medication. Control is suboptimal due to insulin resistance. Switched basal insulin from Lantus (insulin glargine) to Basaglar 8 units QHS due to formulary changes with her insurance. Continue rapid insulin Novolog (insulin aspart) 3-4 units before meals. Continue Trulicity (dulaglutide) to 0.75 mg once weekly. Continue metformin 1000 mg BID.  Refill has been sent to her pharmacy. Discussed signs/symptoms/treatment of hypoglycemia.  Encouraged to call clinic if she experiences.  Continue checking CBGs 4x/day and bring meter to next visit. Next A1C anticipated 10/2016.    ASCVD risk greater than 7.5%. LDL at goal < 100 mg/dL at 91 mg/dL. Continued Aspirin 81 mg and Continued rosuvastatin 5 mg every other day.   Hypertension longstanding diagnosed currently controlled.  Patient continues to have leg/calf cramps and also is having some chest palpitations and headaches.  Patient reports adherence with medication but was only taking Carvedilol once daily. Will increase carvedilol to 12.5 mg BID to assist with palpitaitons, heart rate, and hopefully prophylaxis for headaches.  Will decrease HCTZ to 12.5 mg daily to attempt to reduce leg cramps.    Headaches: Patient reports ongoing headaches which she takes Goody Powders 2-3x/week. Have instructed  patient to stop goody powders and to try meloxicam 30 mg as needed to see if this assists with the headaches.  Also have increased carvedilol to 12.5 mg BID for migraine/headache prophylaxis.   Written patient instructions provided.  Total time in face to face counseling 60 minutes.   Follow up in  Pharmacist Clinic Visit in 1 month.   Patient seen with Maylon Cos, PharmD PGY1 Resident. and Bennye Alm, PharmD PGY2 Resident.   08/11/2016 patient returns to office requesting pen needles.  Sent to her pharmacy.  1 month supply with refills provided.  April Henderson, will contact to communicate refill provided.

## 2016-08-10 NOTE — Assessment & Plan Note (Signed)
ASCVD risk greater than 7.5%. LDL at goal < 100 mg/dL at 91 mg/dL. Continued Aspirin 81 mg and Continued rosuvastatin 5 mg every other day.

## 2016-08-11 ENCOUNTER — Telehealth: Payer: Self-pay | Admitting: *Deleted

## 2016-08-11 DIAGNOSIS — S83412D Sprain of medial collateral ligament of left knee, subsequent encounter: Secondary | ICD-10-CM | POA: Diagnosis not present

## 2016-08-11 DIAGNOSIS — M25562 Pain in left knee: Secondary | ICD-10-CM | POA: Diagnosis not present

## 2016-08-11 MED ORDER — INSULIN PEN NEEDLE 31G X 5 MM MISC: 1.0000 | Freq: Once | 11 refills | Status: DC | PRN

## 2016-08-11 NOTE — Telephone Encounter (Signed)
Left message for pt to call office back to inform her that her insulin pen needles had been sent to her pharmacy. Please inform her of this when she calls back. Katharina Caper, Arlee Santosuosso D, [place]

## 2016-08-11 NOTE — Addendum Note (Signed)
Addended by: Leavy Cella on: 08/11/2016 11:52 AM   Modules accepted: Orders

## 2016-08-14 ENCOUNTER — Telehealth: Payer: Self-pay | Admitting: Student in an Organized Health Care Education/Training Program

## 2016-08-14 NOTE — Telephone Encounter (Signed)
Will forward to PCP.  Salisha Bardsley L, RN  

## 2016-08-14 NOTE — Telephone Encounter (Signed)
Pt was given a medication for headaches by Dr. Valentina Lucks, pt states it starts with an m and it is not helping. Headaches are getting worse.Unable to use Goodys because it can damage the kidneys. Pt is now vomiting due to the pain from headaches. I offered pt a same day appointment, but pt states she is too weak from the vomiting and unable to keep anything down to come in. Pt uses [facility] on [address]. ep

## 2016-08-15 NOTE — Telephone Encounter (Signed)
Received a message the patient called complaining of intractable headaches worse now with vomiting from pain. Upon initial call patient was offered same day appointment however stated she was too weak to come in.  Called and left a message for the patient to call back the family [facility]. Unsure if there are red flags from this intractable headache as I have not yet spoken with the patient. Sounds as though she needs to be evaluated either in a same day appointment tomorrow or by coming into the emergency department this evening.

## 2016-08-16 DIAGNOSIS — M25562 Pain in left knee: Secondary | ICD-10-CM | POA: Diagnosis not present

## 2016-08-16 DIAGNOSIS — S83412D Sprain of medial collateral ligament of left knee, subsequent encounter: Secondary | ICD-10-CM | POA: Diagnosis not present

## 2016-08-17 NOTE — Progress Notes (Signed)
Patient ID: Alicia [name], female   DOB: 03/02/1956, 60 y.o.   MRN: [id number] Reviewed: Agree with Dr. Koval's documentation and management. 

## 2016-08-22 ENCOUNTER — Encounter: Payer: Self-pay | Admitting: Physical Therapy

## 2016-08-22 ENCOUNTER — Ambulatory Visit: Payer: Medicare Other | Attending: Orthopedic Surgery | Admitting: Physical Therapy

## 2016-08-22 DIAGNOSIS — M25562 Pain in left knee: Secondary | ICD-10-CM | POA: Insufficient documentation

## 2016-08-22 DIAGNOSIS — M6281 Muscle weakness (generalized): Secondary | ICD-10-CM | POA: Diagnosis not present

## 2016-08-22 NOTE — Therapy (Signed)
Alicia [name], [place], [address] Phone: [phone]   Fax:  [phone]  Physical Therapy Evaluation  Patient Details  Name: Alicia [name] MRN: [id number] Date of Birth: 1955/11/05 Referring Provider: Kathryne Hitch, MD  Encounter Date: 08/22/2016      PT End of Session - 08/22/16 1341    Visit Number 1   Number of Visits 13   Date for PT Re-Evaluation 10/06/16   Authorization Type MCR, KX at visit 15   PT Start Time 1332   PT Stop Time 1419   PT Time Calculation (min) 47 min   Activity Tolerance Patient tolerated treatment well   Behavior During Therapy [facility] for tasks assessed/performed      Past Medical History:  Diagnosis Date  . Anemia   . Complete deafness    Meningitis at age 26  . Deaf   . Diabetes mellitus   . Diabetic neuropathy ([place])   . Ganglion cyst 06/22/2011  . Gastroparesis   . GERD (gastroesophageal reflux disease)   . H/O: C-section   . Hyperlipidemia   . Hypertension   . Neuromuscular disorder ([place])    diabetic neuropathy  . S/P appy     Past Surgical History:  Procedure Laterality Date  . APPENDECTOMY    . cardiolyte EF 77%, no ischemia in 2006  2006  . CARPAL TUNNEL RELEASE Right 04/20/2015   Procedure: RIGHT CARPAL TUNNEL RELEASE;  Surgeon: Daryll Brod, MD;  Location: [place];  Service: Orthopedics;  Laterality: Right;  . CARPAL TUNNEL RELEASE Left 11/04/2015   Procedure: CARPAL TUNNEL RELEASE;  Surgeon: Daryll Brod, MD;  Location: [place];  Service: Orthopedics;  Laterality: Left;  . CESAREAN SECTION    . OPEN REDUCTION INTERNAL FIXATION (ORIF) DISTAL RADIAL FRACTURE Left 11/04/2015   Procedure: OPEN REDUCTION INTERNAL FIXATION (ORIF) LEFT DISTAL RADIAL FRACTURE POSSIBLE BONE GRAFT;  Surgeon: Daryll Brod, MD;  Location: [place];  Service: Orthopedics;  Laterality: Left;  . TRIGGER FINGER RELEASE Right 04/20/2015   Procedure: RELEASE TRIGGER FINGER/A-1 PULLEY RIGHT MIDDLE FINGER,RIGHT RING FINGER;  Surgeon: Daryll Brod, MD;  Location: [place];  Service: Orthopedics;  Laterality: Right;  . TUBAL LIGATION    . ULNAR NERVE TRANSPOSITION Right 04/20/2015   Procedure: RIGHT ULNAR NERVE DECOMPRESSION;  Surgeon: Daryll Brod, MD;  Location: [place];  Service: Orthopedics;  Laterality: Right;  . UTERINE FIBROID EMBOLIZATION  2007  . WRIST SURGERY     Cyst removed on left  . WRIST SURGERY Right 1985   tendon repair R wrist    There were no vitals filed for this visit.       Subjective Assessment - 08/22/16 1335    Subjective Tumbled down some stairs in Dec 2017 resulting in knee injury. [place] barely walk last nigh due to pain. Throbbing sensation.    How long can you walk comfortably? 15-20 min   Patient Stated Goals getting into and out of car, sleep, extend knee, sit with knee bent, stand from chair   Currently in Pain? Yes   Pain Score 4    Pain Location Knee   Pain Orientation Left   Pain Descriptors / Indicators Throbbing   Aggravating Factors  sitting with knee bent   Pain Relieving Factors medications            [facility] PT Assessment - 08/22/16 0001      Assessment   Medical Diagnosis  L MCL almost healed   Referring Provider Kathryne Hitch, MD   Hand Dominance Right   Next MD Visit not scheduled, prn   Prior Therapy 2 sessions at another clinic     Precautions   Precautions None     Restrictions   Weight Bearing Restrictions No     Balance Screen   Has the patient fallen in the past 6 months Yes   How many times? [place]   Additional Comments two story home     Prior Function   Vocation Other (comment)  laid off at this time     Cognition   Overall Cognitive Status Within Functional Limits for tasks assessed     Observation/Other Assessments   Focus  on Therapeutic Outcomes (FOTO)  35% ability     Sensation   Additional Comments WFL     ROM / Strength   AROM / PROM / Strength AROM;Strength     AROM   Overall AROM Comments Limited DF ROM bilat   AROM Assessment Site Knee   Right/Left Knee Left   Left Knee Extension 0   Left Knee Flexion 119  slight discomfort     Strength   Strength Assessment Site Hip;Knee   Right/Left Hip Right;Left   Right Hip Flexion 4/5   Right Hip ABduction 4-/5   Left Hip Flexion 4-/5   Left Hip Extension 3-/5  unable to lift against gravity   Left Hip ABduction 4-/5   Right/Left Knee Right;Left   Right Knee Flexion 5/5   Right Knee Extension 5/5   Left Knee Flexion 4+/5   Left Knee Extension 4+/5     Palpation   Palpation comment MCL TTP                   [facility] Adult PT Treatment/Exercise - 08/22/16 0001      Exercises   Exercises Knee/Hip     Knee/Hip Exercises: Stretches   Passive Hamstring Stretch Limitations long sitting with towel     Knee/Hip Exercises: Supine   Short Arc Quad Sets Limitations cues for form     Knee/Hip Exercises: Prone   Hip Extension Limitations with iso hamstring curl                PT Education - 08/22/16 1537    Education provided Yes   Education Details anatomy of condition, POC, HEP, exercise form/rationale   Person(s) Educated Patient   Methods Explanation;Demonstration;Tactile cues;Verbal cues;Handout   Comprehension Verbalized understanding;Returned demonstration;Verbal cues required;Tactile cues required;Need further instruction          PT Short Term Goals - 08/22/16 1638      PT SHORT TERM GOAL #1   Title Pain spiking to <=7/10 to decrease pain effects on daily activities by 3/16   Baseline reports over 10/10 at eval   Time 3   Period Weeks   Status New     PT SHORT TERM GOAL #2   Title Independent with HEP as it has been established   Baseline began establishing at eval, will progress as tol   Time 3   Period  Weeks   Status New           PT Long Term Goals - 08/22/16 1639      PT LONG TERM GOAL #1   Title Pt will demo increased step width in gait  to decrease  lateral LOB by 4/6   Baseline scissoring gait and frequent LOB at eval   Time 6   Period Weeks   Status New     PT LONG TERM GOAL #2   Title Pt will be able to sleep through the night witout being woken by pain   Baseline woken multiple times at eval   Time 6   Period Weeks   Status New     PT LONG TERM GOAL #3   Title will be able to get into/out of car without limitation by knee   Baseline severe limitation at eval   Time 6   Period Weeks   Status New     PT LONG TERM GOAL #4   Title will be able to stand from chair throughout the day without limitation by knee   Baseline severe limitation at eval   Time 6   Period Weeks   Status New               Plan - 08/22/16 1538    Clinical Impression Statement Pt presents to PT with complaints of medial knee pain that has been way higher than a 10 on the pain scale, especially last night. Notable weakness in bilateral hips and scissoring gait placing excessive valgus force on knee joint which is further irritating MCL. Also noted genu recurvatum and limited DF ROM. Pt reports frequent calf cramping at night.  Pt will benefit from skilled PT in order to increase strength and flexibility to provide support to knee joint.    Rehab Potential Good   PT Frequency 2x / week   PT Duration 6 weeks   PT Treatment/Interventions ADLs/Self Care Home Management;Cryotherapy;Electrical Stimulation;Iontophoresis 4mg /ml Dexamethasone;Functional mobility training;Stair training;Gait training;Ultrasound;Moist Heat;Therapeutic activities;Therapeutic exercise;Balance training;Neuromuscular re-education;Patient/family education;Passive range of motion;Manual techniques;Dry needling;Taping   PT Next Visit Plan glut strength, ultrasound to MCL?   PT Home Exercise Plan prone hip ext with knee  flexed, SAQ, long sitting hamstring/gastroc stretch   Consulted and Agree with Plan of Care Patient      Patient will benefit from skilled therapeutic intervention in order to improve the following deficits and impairments:  Abnormal gait, Decreased range of motion, Difficulty walking, Increased muscle spasms, Decreased endurance, Decreased activity tolerance, Pain, Impaired flexibility, Decreased strength  Visit Diagnosis: Acute pain of left knee - Plan: PT plan of care cert/re-cert  Muscle weakness (generalized) - Plan: PT plan of care cert/re-cert      G-Codes - [id number] 2016/10/07    Functional Assessment Tool Used (Outpatient Only) Clinical judgement, pain reports on 0-10/10 scale, FOTO   Functional Limitation Mobility: Walking and moving around   Mobility: Walking and Moving Around Current Status [phone]) At least 60 percent but less than 80 percent impaired, limited or restricted   Mobility: Walking and Moving Around Goal Status [phone]) At least 20 percent but less than 40 percent impaired, limited or restricted       Problem List Patient Active Problem List   Diagnosis Date Noted  . Chronic headaches 08/10/2016  . Dysphagia 07/13/2016  . Abdominal pain, epigastric 07/11/2016  . Knee pain, left 06/12/2016  . Lumbar strain, initial encounter 05/05/2016  . Injury of left toe 05/05/2016  . Difficulty sleeping 03/07/2016  . Healthcare maintenance 03/07/2016  . Pes anserine bursitis 12/21/2015  . Statin myopathy 01/21/2015  . Falls 12/08/2014  . New onset of headaches after age 60 12/08/2014  . Ataxia 11/13/2014  . Bilateral thigh pain 10/28/2014  . Myalgia 09/28/2014  .  Seizure ([place]) 06/08/2014  . Trigger thumb of left hand 02/06/2014  . Carpal tunnel syndrome 01/05/2014  . Diabetic retinopathy ([place]) 07/21/2011  . Osteoarthritis of carpometacarpal joint of thumb 04/07/2011  . Gastroparesis 04/15/2010  . Asthma 04/04/2010  . ARTHRITIS, KNEE 09/17/2007  . Diabetic  polyneuropathy ([place]) 08/30/2006  . Type 2 diabetes mellitus ([place]) 08/30/2006  . HYPERCHOLESTEROLEMIA 08/30/2006  . HEARING LOSS NOS OR DEAFNESS 08/30/2006  . Essential HTN 08/30/2006  . Allergic rhinitis 08/30/2006    Shaheed Schmuck C. Bryanah Sidell PT, DPT 08/22/16 8:22 PM   [facility] [address] [place], [place], [address] Phone: [phone]   Fax:  [phone]  Name: MIHA SCHLEISMAN MRN: [id number] Date of Birth: 1955-12-09

## 2016-08-23 ENCOUNTER — Ambulatory Visit (HOSPITAL_BASED_OUTPATIENT_CLINIC_OR_DEPARTMENT_OTHER): Payer: Medicare Other | Attending: Pulmonary Disease | Admitting: Pulmonary Disease

## 2016-08-23 VITALS — Ht 63.0 in | Wt 175.0 lb

## 2016-08-23 DIAGNOSIS — G4733 Obstructive sleep apnea (adult) (pediatric): Secondary | ICD-10-CM | POA: Diagnosis not present

## 2016-08-23 DIAGNOSIS — R0683 Snoring: Secondary | ICD-10-CM

## 2016-08-27 ENCOUNTER — Other Ambulatory Visit: Payer: Self-pay | Admitting: Student in an Organized Health Care Education/Training Program

## 2016-08-28 ENCOUNTER — Other Ambulatory Visit: Payer: Self-pay | Admitting: Student in an Organized Health Care Education/Training Program

## 2016-08-29 ENCOUNTER — Ambulatory Visit: Payer: Medicare Other | Admitting: Physical Therapy

## 2016-08-29 ENCOUNTER — Encounter: Payer: Self-pay | Admitting: Physical Therapy

## 2016-08-29 DIAGNOSIS — M6281 Muscle weakness (generalized): Secondary | ICD-10-CM

## 2016-08-29 DIAGNOSIS — M25562 Pain in left knee: Secondary | ICD-10-CM | POA: Diagnosis not present

## 2016-08-29 NOTE — Therapy (Signed)
[place], [place], [address] Phone: [phone]   Fax:  [phone]  Physical Therapy Treatment  Patient Details  Name: Alicia [name] MRN: [id number] Date of Birth: 1955-11-22 Referring Provider: Kathryne Hitch, MD  Encounter Date: 08/29/2016      PT End of Session - 08/29/16 1331    Visit Number 2   Number of Visits 13   Date for PT Re-Evaluation 10/06/16   Authorization Type MCR, KX at visit 15   PT Start Time 1331   PT Stop Time 1417   PT Time Calculation (min) 46 min   Activity Tolerance Patient tolerated treatment well   Behavior During Therapy [facility] for tasks assessed/performed      Past Medical History:  Diagnosis Date  . Anemia   . Complete deafness    Meningitis at age 55  . Deaf   . Diabetes mellitus   . Diabetic neuropathy ([place])   . Ganglion cyst 06/22/2011  . Gastroparesis   . GERD (gastroesophageal reflux disease)   . H/O: C-section   . Hyperlipidemia   . Hypertension   . Neuromuscular disorder ([place])    diabetic neuropathy  . S/P appy     Past Surgical History:  Procedure Laterality Date  . APPENDECTOMY    . cardiolyte EF 77%, no ischemia in 2006  2006  . CARPAL TUNNEL RELEASE Right 04/20/2015   Procedure: RIGHT CARPAL TUNNEL RELEASE;  Surgeon: Daryll Brod, MD;  Location: [place];  Service: Orthopedics;  Laterality: Right;  . CARPAL TUNNEL RELEASE Left 11/04/2015   Procedure: CARPAL TUNNEL RELEASE;  Surgeon: Daryll Brod, MD;  Location: [place];  Service: Orthopedics;  Laterality: Left;  . CESAREAN SECTION    . OPEN REDUCTION INTERNAL FIXATION (ORIF) DISTAL RADIAL FRACTURE Left 11/04/2015   Procedure: OPEN REDUCTION INTERNAL FIXATION (ORIF) LEFT DISTAL RADIAL FRACTURE POSSIBLE BONE GRAFT;  Surgeon: Daryll Brod, MD;  Location: [place];  Service: Orthopedics;  Laterality: Left;  . TRIGGER FINGER RELEASE Right 04/20/2015   Procedure: RELEASE TRIGGER FINGER/A-1 PULLEY RIGHT MIDDLE FINGER,RIGHT RING FINGER;  Surgeon: Daryll Brod, MD;  Location: [place];  Service: Orthopedics;  Laterality: Right;  . TUBAL LIGATION    . ULNAR NERVE TRANSPOSITION Right 04/20/2015   Procedure: RIGHT ULNAR NERVE DECOMPRESSION;  Surgeon: Daryll Brod, MD;  Location: [place];  Service: Orthopedics;  Laterality: Right;  . UTERINE FIBROID EMBOLIZATION  2007  . WRIST SURGERY     Cyst removed on left  . WRIST SURGERY Right 1985   tendon repair R wrist    There were no vitals filed for this visit.      Subjective Assessment - 08/29/16 1331    Subjective denies doing exercises at home due to sleep study and being sick. has been icing her knee. 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 Yes   Pain Score 4    Pain Location Knee   Pain Orientation Left;Medial                         [facility] Adult PT Treatment/Exercise - 08/29/16 0001      Knee/Hip Exercises: Stretches   Gastroc Stretch 2 reps;30 seconds   Gastroc Stretch Limitations slant board     Knee/Hip  Exercises: Standing   Heel Raises Limitations heel/toe raises   Other Standing Knee Exercises airex- wide BOS, narrow, tandem     Knee/Hip Exercises: Supine   Straight Leg Raises 10 reps     Knee/Hip Exercises: Sidelying   Hip ABduction 15 reps   Clams x20 each     Knee/Hip Exercises: Prone   Hamstring Curl 15 reps   Hamstring Curl Limitations both 1#     Modalities   Modalities Iontophoresis     Iontophoresis   Type of Iontophoresis Dexamethasone   Location L medial knee   Dose 1cc   Time 63min  education, prep, placement                PT Education - 08/29/16 1424    Education provided Yes   Education Details exercise form/rationale, importance of HEP   Person(s) Educated  Patient   Methods Explanation;Demonstration;Tactile cues;Verbal cues   Comprehension Verbalized understanding;Returned demonstration;Verbal cues required;Tactile cues required;Need further instruction          PT Short Term Goals - 08/22/16 1638      PT SHORT TERM GOAL #1   Title Pain spiking to <=7/10 to decrease pain effects on daily activities by 3/16   Baseline reports over 10/10 at eval   Time 3   Period Weeks   Status New     PT SHORT TERM GOAL #2   Title Independent with HEP as it has been established   Baseline began establishing at eval, will progress as tol   Time 3   Period Weeks   Status New           PT Long Term Goals - 08/22/16 1639      PT LONG TERM GOAL #1   Title Pt will demo increased step width in gait  to decrease lateral LOB by 4/6   Baseline scissoring gait and frequent LOB at eval   Time 6   Period Weeks   Status New     PT LONG TERM GOAL #2   Title Pt will be able to sleep through the night witout being woken by pain   Baseline woken multiple times at eval   Time 6   Period Weeks   Status New     PT LONG TERM GOAL #3   Title will be able to get into/out of car without limitation by knee   Baseline severe limitation at eval   Time 6   Period Weeks   Status New     PT LONG TERM GOAL #4   Title will be able to stand from chair throughout the day without limitation by knee   Baseline severe limitation at eval   Time 6   Period Weeks   Status New               Plan - 08/29/16 1421    Clinical Impression Statement pt with significant balance difficulty both due to vestibular inputs and lower extremity muscular imbalances. required mod assist for balance while on airex and hand hold on bar required after stepping off of slant board. was challenged by exercises today. I encouraged her to do exercises at home to get stronger and assured her that the balance exercises do get easier.   PT Next Visit Plan effectiveness of ionto?,  gastroc stretch, hamstring & hip abductor activation   PT Home Exercise Plan prone hip ext with knee flexed, SAQ, long sitting hamstring/gastroc stretch   Consulted and Agree with  Plan of Care Patient      Patient will benefit from skilled therapeutic intervention in order to improve the following deficits and impairments:     Visit Diagnosis: Acute pain of left knee  Muscle weakness (generalized)     Problem List Patient Active Problem List   Diagnosis Date Noted  . Chronic headaches 08/10/2016  . Dysphagia 07/13/2016  . Abdominal pain, epigastric 07/11/2016  . Knee pain, left 06/12/2016  . Lumbar strain, initial encounter 05/05/2016  . Injury of left toe 05/05/2016  . Difficulty sleeping 03/07/2016  . Healthcare maintenance 03/07/2016  . Pes anserine bursitis 12/21/2015  . Statin myopathy 01/21/2015  . Falls 12/08/2014  . New onset of headaches after age 24 12/08/2014  . Ataxia 11/13/2014  . Bilateral thigh pain 10/28/2014  . Myalgia 09/28/2014  . Seizure ([place]) 06/08/2014  . Trigger thumb of left hand 02/06/2014  . Carpal tunnel syndrome 01/05/2014  . Diabetic retinopathy ([place]) 07/21/2011  . Osteoarthritis of carpometacarpal joint of thumb 04/07/2011  . Gastroparesis 04/15/2010  . Asthma 04/04/2010  . ARTHRITIS, KNEE 09/17/2007  . Diabetic polyneuropathy ([place]) 08/30/2006  . Type 2 diabetes mellitus ([place]) 08/30/2006  . HYPERCHOLESTEROLEMIA 08/30/2006  . HEARING LOSS NOS OR DEAFNESS 08/30/2006  . Essential HTN 08/30/2006  . Allergic rhinitis 08/30/2006    Marilee Ditommaso C. Cardell Rachel PT, DPT 08/29/16 2:25 PM   [facility] [address] [place], [place], [address] Phone: [phone]   Fax:  [phone]  Name: Alicia [name] MRN: [id number] Date of Birth: 1956-06-20

## 2016-08-31 ENCOUNTER — Encounter: Payer: Self-pay | Admitting: Physical Therapy

## 2016-08-31 ENCOUNTER — Ambulatory Visit: Payer: Medicare Other | Attending: Orthopedic Surgery | Admitting: Physical Therapy

## 2016-08-31 ENCOUNTER — Telehealth: Payer: Self-pay | Admitting: Pulmonary Disease

## 2016-08-31 DIAGNOSIS — M6281 Muscle weakness (generalized): Secondary | ICD-10-CM | POA: Insufficient documentation

## 2016-08-31 DIAGNOSIS — M25562 Pain in left knee: Secondary | ICD-10-CM | POA: Diagnosis not present

## 2016-08-31 DIAGNOSIS — G4733 Obstructive sleep apnea (adult) (pediatric): Secondary | ICD-10-CM | POA: Insufficient documentation

## 2016-08-31 NOTE — Telephone Encounter (Signed)
Sleep study 08/23/16 >> AHI 6.6, SpO2 low 78%.   Will have my nurse inform pt that sleep study shows mild sleep apnea.  Please schedule ROV with me to review tx options >> okay to double book visit.

## 2016-08-31 NOTE — Therapy (Signed)
[place], [place], [address] Phone: [phone]   Fax:  [phone]  Physical Therapy Treatment  Patient Details  Name: Alicia [name] MRN: [id number] Date of Birth: [birth year] Referring Provider: Kathryne Hitch, MD  Encounter Date: 08/31/2016      PT End of Session - 08/31/16 0929    Visit Number 3   Number of Visits 13   Date for PT Re-Evaluation 10/06/16   Authorization Type MCR, KX at visit 15   PT Start Time 0930   PT Stop Time 1014   PT Time Calculation (min) 44 min   Activity Tolerance Patient tolerated treatment well   Behavior During Therapy [facility] for tasks assessed/performed      Past Medical History:  Diagnosis Date  . Anemia   . Complete deafness    Meningitis at age 33  . Deaf   . Diabetes mellitus   . Diabetic neuropathy ([place])   . Ganglion cyst 06/22/2011  . Gastroparesis   . GERD (gastroesophageal reflux disease)   . H/O: C-section   . Hyperlipidemia   . Hypertension   . Neuromuscular disorder ([place])    diabetic neuropathy  . S/P appy     Past Surgical History:  Procedure Laterality Date  . APPENDECTOMY    . cardiolyte EF 77%, no ischemia in 2006  2006  . CARPAL TUNNEL RELEASE Right 04/20/2015   Procedure: RIGHT CARPAL TUNNEL RELEASE;  Surgeon: Daryll Brod, MD;  Location: [place];  Service: Orthopedics;  Laterality: Right;  . CARPAL TUNNEL RELEASE Left 11/04/2015   Procedure: CARPAL TUNNEL RELEASE;  Surgeon: Daryll Brod, MD;  Location: [place];  Service: Orthopedics;  Laterality: Left;  . CESAREAN SECTION    . OPEN REDUCTION INTERNAL FIXATION (ORIF) DISTAL RADIAL FRACTURE Left 11/04/2015   Procedure: OPEN REDUCTION INTERNAL FIXATION (ORIF) LEFT DISTAL RADIAL FRACTURE POSSIBLE BONE GRAFT;  Surgeon: Daryll Brod, MD;  Location: [place];  Service: Orthopedics;  Laterality: Left;  . TRIGGER FINGER RELEASE Right 04/20/2015   Procedure:  RELEASE TRIGGER FINGER/A-1 PULLEY RIGHT MIDDLE FINGER,RIGHT RING FINGER;  Surgeon: Daryll Brod, MD;  Location: [place];  Service: Orthopedics;  Laterality: Right;  . TUBAL LIGATION    . ULNAR NERVE TRANSPOSITION Right 04/20/2015   Procedure: RIGHT ULNAR NERVE DECOMPRESSION;  Surgeon: Daryll Brod, MD;  Location: [place];  Service: Orthopedics;  Laterality: Right;  . UTERINE FIBROID EMBOLIZATION  2007  . WRIST SURGERY     Cyst removed on left  . WRIST SURGERY Right 1985   tendon repair R wrist    There were no vitals filed for this visit.      Subjective Assessment - 08/31/16 0929    Subjective reports knee is okay. did not notice a difference after iotno. felt a "blip" of pain last night. notices improvement when walking down stairs, did not have to think about it.    Patient Stated Goals getting into and out of car, sleep, extend knee, sit with knee bent, stand from chair   Currently in Pain? Yes   Pain Score 3    Pain Location Knee   Pain Orientation Left;Mid   Pain Descriptors / Indicators Sore   Aggravating Factors  pain with getting out of driver side of car                         [facility] Adult PT Treatment/Exercise -  08/31/16 0001      Knee/Hip Exercises: Aerobic   Nustep 5 min L3     Knee/Hip Exercises: Standing   Extension Limitations with ball behind knee   Functional Squat Limitations mini squats ball bw knees     Knee/Hip Exercises: Supine   Bridges with Ball Squeeze 15 reps   Other Supine Knee/Hip Exercises restisted inversion red tband     Knee/Hip Exercises: Sidelying   Clams elevated knee/toe taps x15 each     Iontophoresis   Type of Iontophoresis Dexamethasone   Location L medial knee   Dose 1cc   Time 15min  education, prep, placement                PT Education - 08/31/16 1017    Education provided Yes   Education Details exercise form/rationale, HEP   Person(s) Educated Patient   Methods  Demonstration;Tactile cues   Comprehension Returned demonstration;Tactile cues required;Need further instruction          PT Short Term Goals - 08/22/16 1638      PT SHORT TERM GOAL #1   Title Pain spiking to <=7/10 to decrease pain effects on daily activities by 3/16   Baseline reports over 10/10 at eval   Time 3   Period Weeks   Status New     PT SHORT TERM GOAL #2   Title Independent with HEP as it has been established   Baseline began establishing at eval, will progress as tol   Time 3   Period Weeks   Status New           PT Long Term Goals - 08/22/16 1639      PT LONG TERM GOAL #1   Title Pt will demo increased step width in gait  to decrease lateral LOB by 4/6   Baseline scissoring gait and frequent LOB at eval   Time 6   Period Weeks   Status New     PT LONG TERM GOAL #2   Title Pt will be able to sleep through the night witout being woken by pain   Baseline woken multiple times at eval   Time 6   Period Weeks   Status New     PT LONG TERM GOAL #3   Title will be able to get into/out of car without limitation by knee   Baseline severe limitation at eval   Time 6   Period Weeks   Status New     PT LONG TERM GOAL #4   Title will be able to stand from chair throughout the day without limitation by knee   Baseline severe limitation at eval   Time 6   Period Weeks   Status New               Plan - 08/31/16 1015    Clinical Impression Statement tightness noted in quadriceps which was decreased with rolling today. focused on activation of hamstrings and gluts to decrease overuse of quads. cont pain with rotation of lower leg that pulls on MCL but  improvements with functional activities such as stairs.    PT Next Visit Plan effectiveness of ionto?, gastroc stretch, hamstring & hip abductor activation   PT Home Exercise Plan prone hip ext with knee flexed, SAQ, long sitting hamstring/gastroc stretch   Consulted and Agree with Plan of Care Patient       Patient will benefit from skilled therapeutic intervention in order to improve the following deficits and  impairments:     Visit Diagnosis: Acute pain of left knee  Muscle weakness (generalized)     Problem List Patient Active Problem List   Diagnosis Date Noted  . Chronic headaches 08/10/2016  . Dysphagia 07/13/2016  . Abdominal pain, epigastric 07/11/2016  . Knee pain, left 06/12/2016  . Lumbar strain, initial encounter 05/05/2016  . Injury of left toe 05/05/2016  . Difficulty sleeping 03/07/2016  . Healthcare maintenance 03/07/2016  . Pes anserine bursitis 12/21/2015  . Statin myopathy 01/21/2015  . Falls 12/08/2014  . New onset of headaches after age 62 12/08/2014  . Ataxia 11/13/2014  . Bilateral thigh pain 10/28/2014  . Myalgia 09/28/2014  . Seizure ([place]) 06/08/2014  . Trigger thumb of left hand 02/06/2014  . Carpal tunnel syndrome 01/05/2014  . Diabetic retinopathy ([place]) 07/21/2011  . Osteoarthritis of carpometacarpal joint of thumb 04/07/2011  . Gastroparesis 04/15/2010  . Asthma 04/04/2010  . ARTHRITIS, KNEE 09/17/2007  . Diabetic polyneuropathy ([place]) 08/30/2006  . Type 2 diabetes mellitus ([place]) 08/30/2006  . HYPERCHOLESTEROLEMIA 08/30/2006  . HEARING LOSS NOS OR DEAFNESS 08/30/2006  . Essential HTN 08/30/2006  . Allergic rhinitis 08/30/2006    Pearline Yerby C. Deyonna Fitzsimmons PT, DPT 08/31/16 10:17 AM   [facility] [address] [place], [place], [address] Phone: [phone]   Fax:  [phone]  Name: Alicia [name] MRN: [id number] Date of Birth: [birth year]

## 2016-08-31 NOTE — Procedures (Signed)
    Patient Name: Alicia [name], Alicia [name] Date: 08/23/2016 Gender: Female D.O.B: 1956-04-15 Age (years): 71 Referring Provider: Chesley Mires MD, ABSM Height (inches): 63 Interpreting Physician: Chesley Mires MD, ABSM Weight (lbs): 175 RPSGT: Carolin Coy BMI: 31 MRN: [id number] Neck Size: 13.00  CLINICAL INFORMATION Sleep Study Type: NPSG  Indication for sleep study: Diabetes, Hypertension, Obesity, Snoring  Epworth Sleepiness Score: 20  SLEEP STUDY TECHNIQUE As per the AASM Manual for the Scoring of Sleep and Associated Events v2.3 (April 2016) with a hypopnea requiring 4% desaturations.  The channels recorded and monitored were frontal, central and occipital EEG, electrooculogram (EOG), submentalis EMG (chin), nasal and oral airflow, thoracic and abdominal wall motion, anterior tibialis EMG, snore microphone, electrocardiogram, and pulse oximetry.  MEDICATIONS Medications self-administered by patient taken the night of the study : GABAPENTIN, [place] The study was initiated at 11:22:47 PM and ended at 5:20:57 AM.  Sleep onset time was 8.7 minutes and the sleep efficiency was 75.8%. The total sleep time was 271.5 minutes.  Stage REM latency was 160.5 minutes.  The patient spent 13.63% of the night in stage N1 sleep, 70.72% in stage N2 sleep, 0.00% in stage N3 and 15.65% in REM.  Alpha intrusion was absent.  Supine sleep was 14.55%.  RESPIRATORY PARAMETERS The overall apnea/hypopnea index (AHI) was 6.6 per hour. There were 13 total apneas, including 12 obstructive, 1 central and 0 mixed apneas. There were 17 hypopneas and 21 RERAs.  The AHI during Stage REM sleep was 29.6 per hour.  AHI while supine was 0.0 per hour.  The mean oxygen saturation was 91.60%. The minimum SpO2 during sleep was 78.00%.  Moderate snoring was noted during this study.  CARDIAC DATA The 2 lead EKG demonstrated sinus rhythm. The mean heart rate was 95.43 beats per  minute. Other EKG findings include: PVCs.  LEG MOVEMENT DATA The total PLMS were 0 with a resulting PLMS index of 0.00. Associated arousal with leg movement index was 0.0 .  IMPRESSIONS - This study showed mild obstructive sleep apnea with an AHI of 6.6 and SpO2 low of 78%.  DIAGNOSIS - Obstructive Sleep Apnea (327.23 [G47.33 ICD-10])  RECOMMENDATIONS - Additional therapies include CPAP, oral appliance, or surgical assessment.  [Electronically signed] 08/31/2016 11:11 AM  Chesley Mires MD, ABSM Diplomate, American Board of Sleep Medicine   NPI: [id number]

## 2016-09-04 ENCOUNTER — Ambulatory Visit: Payer: Medicare Other | Admitting: Physical Therapy

## 2016-09-04 ENCOUNTER — Encounter: Payer: Self-pay | Admitting: Physical Therapy

## 2016-09-04 DIAGNOSIS — M25562 Pain in left knee: Secondary | ICD-10-CM | POA: Diagnosis not present

## 2016-09-04 DIAGNOSIS — M6281 Muscle weakness (generalized): Secondary | ICD-10-CM | POA: Diagnosis not present

## 2016-09-04 NOTE — Therapy (Signed)
[place], [place], [address] Phone: [phone]   Fax:  [phone]  Physical Therapy Treatment  Patient Details  Name: Alicia [name] MRN: [id number] Date of Birth: 05-10-1956 Referring Provider: Kathryne Hitch, MD  Encounter Date: 09/04/2016      PT End of Session - 09/04/16 1426    Visit Number 4   Number of Visits 13   Date for PT Re-Evaluation 10/06/16   Authorization Type MCR, KX at visit 15   PT Start Time 1419   PT Stop Time 1508   PT Time Calculation (min) 49 min   Activity Tolerance Patient tolerated treatment well   Behavior During Therapy [facility] for tasks assessed/performed      Past Medical History:  Diagnosis Date  . Anemia   . Complete deafness    Meningitis at age 25  . Deaf   . Diabetes mellitus   . Diabetic neuropathy ([place])   . Ganglion cyst 06/22/2011  . Gastroparesis   . GERD (gastroesophageal reflux disease)   . H/O: C-section   . Hyperlipidemia   . Hypertension   . Neuromuscular disorder ([place])    diabetic neuropathy  . S/P appy     Past Surgical History:  Procedure Laterality Date  . APPENDECTOMY    . cardiolyte EF 77%, no ischemia in 2006  2006  . CARPAL TUNNEL RELEASE Right 04/20/2015   Procedure: RIGHT CARPAL TUNNEL RELEASE;  Surgeon: Daryll Brod, MD;  Location: [place];  Service: Orthopedics;  Laterality: Right;  . CARPAL TUNNEL RELEASE Left 11/04/2015   Procedure: CARPAL TUNNEL RELEASE;  Surgeon: Daryll Brod, MD;  Location: [place];  Service: Orthopedics;  Laterality: Left;  . CESAREAN SECTION    . OPEN REDUCTION INTERNAL FIXATION (ORIF) DISTAL RADIAL FRACTURE Left 11/04/2015   Procedure: OPEN REDUCTION INTERNAL FIXATION (ORIF) LEFT DISTAL RADIAL FRACTURE POSSIBLE BONE GRAFT;  Surgeon: Daryll Brod, MD;  Location: [place];  Service: Orthopedics;  Laterality: Left;  . TRIGGER FINGER RELEASE Right 04/20/2015   Procedure:  RELEASE TRIGGER FINGER/A-1 PULLEY RIGHT MIDDLE FINGER,RIGHT RING FINGER;  Surgeon: Daryll Brod, MD;  Location: [place];  Service: Orthopedics;  Laterality: Right;  . TUBAL LIGATION    . ULNAR NERVE TRANSPOSITION Right 04/20/2015   Procedure: RIGHT ULNAR NERVE DECOMPRESSION;  Surgeon: Daryll Brod, MD;  Location: [place];  Service: Orthopedics;  Laterality: Right;  . UTERINE FIBROID EMBOLIZATION  2007  . WRIST SURGERY     Cyst removed on left  . WRIST SURGERY Right 1985   tendon repair R wrist    There were no vitals filed for this visit.      Subjective Assessment - 09/04/16 1423    Subjective sat and watched a movie last night without pain, had pain and difficulty balancing when standing. was able to kneel and clean without limitation by knee pain.    Patient Stated Goals getting into and out of car, sleep, extend knee, sit with knee bent, stand from chair   Currently in Pain? No/denies                         [facility] Adult PT Treatment/Exercise - 09/04/16 0001      Knee/Hip Exercises: Stretches   Passive Hamstring Stretch Limitations supine green strap   Piriformis Stretch Limitations figure 4   Gastroc Stretch 2 reps;30 seconds   Gastroc Stretch Limitations slant board  Knee/Hip Exercises: Standing   Heel Raises 20 reps   Heel Raises Limitations counter support   Knee Flexion Limitations x15 ea hamstring curls with counter support     Knee/Hip Exercises: Seated   Long Arc Quad Limitations bilateral, ball bw feet   Knee/Hip Flexion IR ankle to knee taps     Knee/Hip Exercises: Supine   Bridges with Ball Squeeze 2 sets;10 reps   Straight Leg Raises 10 reps     Knee/Hip Exercises: Sidelying   Clams elevated knee/toe taps, red band at knees     Modalities   Modalities Cryotherapy     Cryotherapy   Number Minutes Cryotherapy 10 Minutes   Cryotherapy Location Knee   Type of Cryotherapy Ice pack                 PT Education - 09/04/16 1728    Education provided Yes   Education Details exercise form/rationale   Person(s) Educated Patient   Methods Demonstration;Tactile cues   Comprehension Returned demonstration;Tactile cues required;Need further instruction          PT Short Term Goals - 08/22/16 1638      PT SHORT TERM GOAL #1   Title Pain spiking to <=7/10 to decrease pain effects on daily activities by 3/16   Baseline reports over 10/10 at eval   Time 3   Period Weeks   Status New     PT SHORT TERM GOAL #2   Title Independent with HEP as it has been established   Baseline began establishing at eval, will progress as tol   Time 3   Period Weeks   Status New           PT Long Term Goals - 08/22/16 1639      PT LONG TERM GOAL #1   Title Pt will demo increased step width in gait  to decrease lateral LOB by 4/6   Baseline scissoring gait and frequent LOB at eval   Time 6   Period Weeks   Status New     PT LONG TERM GOAL #2   Title Pt will be able to sleep through the night witout being woken by pain   Baseline woken multiple times at eval   Time 6   Period Weeks   Status New     PT LONG TERM GOAL #3   Title will be able to get into/out of car without limitation by knee   Baseline severe limitation at eval   Time 6   Period Weeks   Status New     PT LONG TERM GOAL #4   Title will be able to stand from chair throughout the day without limitation by knee   Baseline severe limitation at eval   Time 6   Period Weeks   Status New               Plan - 09/04/16 1627    Clinical Impression Statement Able to complete exercises today without pain. avoided nustep and heavy exercises since pt had not eaten lunch and is diabetic. Pt is making good progress and experiencing significatn decreases in pain with daily activities.    PT Next Visit Plan gastroc stretch, hamstring & hip abductor activation   PT Home Exercise Plan prone hip ext with knee flexed, SAQ, long  sitting hamstring/gastroc stretch   Consulted and Agree with Plan of Care Patient      Patient will benefit from skilled therapeutic intervention in order  to improve the following deficits and impairments:     Visit Diagnosis: Acute pain of left knee  Muscle weakness (generalized)     Problem List Patient Active Problem List   Diagnosis Date Noted  . OSA (obstructive sleep apnea) 08/31/2016  . Chronic headaches 08/10/2016  . Dysphagia 07/13/2016  . Abdominal pain, epigastric 07/11/2016  . Knee pain, left 06/12/2016  . Lumbar strain, initial encounter 05/05/2016  . Injury of left toe 05/05/2016  . Difficulty sleeping 03/07/2016  . Healthcare maintenance 03/07/2016  . Pes anserine bursitis 12/21/2015  . Statin myopathy 01/21/2015  . Falls 12/08/2014  . New onset of headaches after age 59 12/08/2014  . Ataxia 11/13/2014  . Bilateral thigh pain 10/28/2014  . Myalgia 09/28/2014  . Seizure ([place]) 06/08/2014  . Trigger thumb of left hand 02/06/2014  . Carpal tunnel syndrome 01/05/2014  . Diabetic retinopathy ([place]) 07/21/2011  . Osteoarthritis of carpometacarpal joint of thumb 04/07/2011  . Gastroparesis 04/15/2010  . Asthma 04/04/2010  . ARTHRITIS, KNEE 09/17/2007  . Diabetic polyneuropathy ([place]) 08/30/2006  . Type 2 diabetes mellitus ([place]) 08/30/2006  . HYPERCHOLESTEROLEMIA 08/30/2006  . HEARING LOSS NOS OR DEAFNESS 08/30/2006  . Essential HTN 08/30/2006  . Allergic rhinitis 08/30/2006   Adenike Shidler C. Emoree Sasaki PT, DPT 09/04/16 5:29 PM   [facility] [address] [place], [place], [address] Phone: [phone]   Fax:  [phone]  Name: Alicia [name] MRN: [id number] Date of Birth: 03/31/1956

## 2016-09-05 ENCOUNTER — Encounter: Payer: Self-pay | Admitting: Physical Therapy

## 2016-09-07 ENCOUNTER — Ambulatory Visit: Payer: Medicare Other | Admitting: Physical Therapy

## 2016-09-07 ENCOUNTER — Encounter: Payer: Self-pay | Admitting: Physical Therapy

## 2016-09-07 DIAGNOSIS — M25562 Pain in left knee: Secondary | ICD-10-CM | POA: Diagnosis not present

## 2016-09-07 DIAGNOSIS — M6281 Muscle weakness (generalized): Secondary | ICD-10-CM

## 2016-09-07 NOTE — Therapy (Signed)
[place], [place], [address] Phone: [phone]   Fax:  [phone]  Physical Therapy Treatment  Patient Details  Name: Alicia [name] MRN: [id number] Date of Birth: 1955-11-17 Referring Provider: Kathryne Hitch, MD  Encounter Date: 09/07/2016      PT End of Session - 09/07/16 1020    Visit Number 5   Number of Visits 13   Date for PT Re-Evaluation 10/06/16   Authorization Type MCR, KX at visit 15   PT Start Time 1018   PT Stop Time 1057   PT Time Calculation (min) 39 min   Activity Tolerance Patient tolerated treatment well   Behavior During Therapy [facility] for tasks assessed/performed      Past Medical History:  Diagnosis Date  . Anemia   . Complete deafness    Meningitis at age 20  . Deaf   . Diabetes mellitus   . Diabetic neuropathy ([place])   . Ganglion cyst 06/22/2011  . Gastroparesis   . GERD (gastroesophageal reflux disease)   . H/O: C-section   . Hyperlipidemia   . Hypertension   . Neuromuscular disorder ([place])    diabetic neuropathy  . S/P appy     Past Surgical History:  Procedure Laterality Date  . APPENDECTOMY    . cardiolyte EF 77%, no ischemia in 2006  2006  . CARPAL TUNNEL RELEASE Right 04/20/2015   Procedure: RIGHT CARPAL TUNNEL RELEASE;  Surgeon: Daryll Brod, MD;  Location: [place];  Service: Orthopedics;  Laterality: Right;  . CARPAL TUNNEL RELEASE Left 11/04/2015   Procedure: CARPAL TUNNEL RELEASE;  Surgeon: Daryll Brod, MD;  Location: [place];  Service: Orthopedics;  Laterality: Left;  . CESAREAN SECTION    . OPEN REDUCTION INTERNAL FIXATION (ORIF) DISTAL RADIAL FRACTURE Left 11/04/2015   Procedure: OPEN REDUCTION INTERNAL FIXATION (ORIF) LEFT DISTAL RADIAL FRACTURE POSSIBLE BONE GRAFT;  Surgeon: Daryll Brod, MD;  Location: [place];  Service: Orthopedics;  Laterality: Left;  . TRIGGER FINGER RELEASE Right 04/20/2015   Procedure:  RELEASE TRIGGER FINGER/A-1 PULLEY RIGHT MIDDLE FINGER,RIGHT RING FINGER;  Surgeon: Daryll Brod, MD;  Location: [place];  Service: Orthopedics;  Laterality: Right;  . TUBAL LIGATION    . ULNAR NERVE TRANSPOSITION Right 04/20/2015   Procedure: RIGHT ULNAR NERVE DECOMPRESSION;  Surgeon: Daryll Brod, MD;  Location: [place];  Service: Orthopedics;  Laterality: Right;  . UTERINE FIBROID EMBOLIZATION  2007  . WRIST SURGERY     Cyst removed on left  . WRIST SURGERY Right 1985   tendon repair R wrist    There were no vitals filed for this visit.      Subjective Assessment - 09/07/16 1020    Subjective Pt reports medial knee pain yesterday but feels fine today. very little discomfort today. a little pain when she stood from the chair but it went away. getting out of the car and navigating stairs are getting easier.    Patient Stated Goals getting into and out of car, sleep, extend knee, sit with knee bent, stand from chair   Currently in Pain? No/denies                         [facility] Adult PT Treatment/Exercise - 09/07/16 0001      Knee/Hip Exercises: Stretches   Passive Hamstring Stretch Limitations supine green strap   Gastroc Stretch 2 reps;30 seconds   Gastroc  Stretch Limitations slant board   Other Knee/Hip Stretches figure 4     Knee/Hip Exercises: Aerobic   Nustep 5 min L4     Knee/Hip Exercises: Standing   Knee Flexion Limitations x15 ea hamstring curls with counter support   Wall Squat 10 reps;5 seconds   Other Standing Knee Exercises monster walks red tband      Knee/Hip Exercises: Sidelying   Hip ABduction 15 reps;Both   Hip ABduction Limitations abduct, knee flex, ext, lower                PT Education - 09/07/16 1022    Education provided Yes   Education Details exercise form/rationale, HEP   Person(s) Educated Patient   Methods Explanation;Demonstration;Tactile cues;Verbal cues;Handout   Comprehension  Verbalized understanding;Returned demonstration;Verbal cues required;Tactile cues required;Need further instruction          PT Short Term Goals - 08/22/16 1638      PT SHORT TERM GOAL #1   Title Pain spiking to <=7/10 to decrease pain effects on daily activities by 3/16   Baseline reports over 10/10 at eval   Time 3   Period Weeks   Status New     PT SHORT TERM GOAL #2   Title Independent with HEP as it has been established   Baseline began establishing at eval, will progress as tol   Time 3   Period Weeks   Status New           PT Long Term Goals - 08/22/16 1639      PT LONG TERM GOAL #1   Title Pt will demo increased step width in gait  to decrease lateral LOB by 4/6   Baseline scissoring gait and frequent LOB at eval   Time 6   Period Weeks   Status New     PT LONG TERM GOAL #2   Title Pt will be able to sleep through the night witout being woken by pain   Baseline woken multiple times at eval   Time 6   Period Weeks   Status New     PT LONG TERM GOAL #3   Title will be able to get into/out of car without limitation by knee   Baseline severe limitation at eval   Time 6   Period Weeks   Status New     PT LONG TERM GOAL #4   Title will be able to stand from chair throughout the day without limitation by knee   Baseline severe limitation at eval   Time 6   Period Weeks   Status New               Plan - 09/07/16 1058    Clinical Impression Statement Pt was able to complete exercises today with difficulty but denied increase in pain. Will reassess next visit to determine POC.    PT Next Visit Plan evaluate goals    PT Home Exercise Plan prone hip ext with knee flexed, SAQ, long sitting hamstring/gastroc stretch, side stepping with band in squat, hip abd with knee flx/ext, standing HS curls, wall sit   Consulted and Agree with Plan of Care Patient      Patient will benefit from skilled therapeutic intervention in order to improve the following  deficits and impairments:     Visit Diagnosis: Acute pain of left knee  Muscle weakness (generalized)     Problem List Patient Active Problem List   Diagnosis Date Noted  . OSA (  obstructive sleep apnea) 08/31/2016  . Chronic headaches 08/10/2016  . Dysphagia 07/13/2016  . Abdominal pain, epigastric 07/11/2016  . Knee pain, left 06/12/2016  . Lumbar strain, initial encounter 05/05/2016  . Injury of left toe 05/05/2016  . Difficulty sleeping 03/07/2016  . Healthcare maintenance 03/07/2016  . Pes anserine bursitis 12/21/2015  . Statin myopathy 01/21/2015  . Falls 12/08/2014  . New onset of headaches after age 32 12/08/2014  . Ataxia 11/13/2014  . Bilateral thigh pain 10/28/2014  . Myalgia 09/28/2014  . Seizure ([place]) 06/08/2014  . Trigger thumb of left hand 02/06/2014  . Carpal tunnel syndrome 01/05/2014  . Diabetic retinopathy ([place]) 07/21/2011  . Osteoarthritis of carpometacarpal joint of thumb 04/07/2011  . Gastroparesis 04/15/2010  . Asthma 04/04/2010  . ARTHRITIS, KNEE 09/17/2007  . Diabetic polyneuropathy ([place]) 08/30/2006  . Type 2 diabetes mellitus ([place]) 08/30/2006  . HYPERCHOLESTEROLEMIA 08/30/2006  . HEARING LOSS NOS OR DEAFNESS 08/30/2006  . Essential HTN 08/30/2006  . Allergic rhinitis 08/30/2006    Kaydence Baba C. Amariana Mirando PT, DPT 09/07/16 11:01 AM   [facility] [address] [place], [place], [address] Phone: [phone]   Fax:  [phone]  Name: Alicia [name] MRN: [id number] Date of Birth: May 19, 1956

## 2016-09-07 NOTE — Telephone Encounter (Signed)
Called patient through Sign Language Interpreter line - pt aware of results and is scheduled to see Dr Halford Chessman on 09/13/16 at 10am. Nothing further needed.

## 2016-09-08 ENCOUNTER — Encounter: Payer: Self-pay | Admitting: Pharmacist

## 2016-09-08 ENCOUNTER — Ambulatory Visit (INDEPENDENT_AMBULATORY_CARE_PROVIDER_SITE_OTHER): Payer: Medicare Other | Admitting: Pharmacist

## 2016-09-08 DIAGNOSIS — Z794 Long term (current) use of insulin: Secondary | ICD-10-CM

## 2016-09-08 DIAGNOSIS — E119 Type 2 diabetes mellitus without complications: Secondary | ICD-10-CM | POA: Diagnosis not present

## 2016-09-08 MED ORDER — LIRAGLUTIDE 18 MG/3ML ~~LOC~~ SOPN: 0.6000 mg | PEN_INJECTOR | Freq: Every day | SUBCUTANEOUS | 3 refills | Status: DC

## 2016-09-08 NOTE — Progress Notes (Signed)
    S:     Chief Complaint  Patient presents with  . Medication Management    diabetes    Patient arrives in good spirits.  Presents for diabetes evaluation, education, and management at the request of Dr. Valentina Lucks to follow up on her Trulicity.  Patient was last seen by Primary Care Provider Dr. Burr Medico on 07/10/2016.   Family/Social History:   Patient reports adherence with medications.  Current diabetes medications include: Metformin 1000 mg BID, Novolog 3 units with lunch and dinner, Basaglar 8 units daily, Trulicity 6.60 mg once weekly Current hypertension medications include: Carvedilol 12.5 mg BID, HCTZ 12.5 mg QD, losartan 100 mg QD  Patient denies symptomatic hypoglycemic events.  O:  Physical Exam not performed   ROS not performed   Lab Results  Component Value Date   HGBA1C 8.2 07/11/2016   Vitals:   09/08/16 0912  BP: 138/78  Pulse: 60    2 hour post-prandial/random CBG: 100s-200s (Mostly 200s, low of 79)  10 year ASCVD risk: 14.2%  A/P: Diabetes longstanding currently moderately controlled in with an A1c of 8.2 in Jan 2018. Patient denies hypoglycemic events and is able to verbalize appropriate hypoglycemia management plan. Patient reports adherence with medication. Control is suboptimal due to efforts to find and titrate a suitable GLP-1 agent. Patient does not tolerate Trulicity 6.30 mg due to nausea and vomiting side effects.  Discontinued Trulicity d/t N/V side effects. Started Victoza (liraglutide) 0.6 mg once daily x 1 week, then 1.2 mg daily x 1 week if tolerated. May continue 1.2 mg if tolerated. If 1.2 is NOT tolerated will revert to 0.6 mg daily x 1 week then increase by one click per week as tolerated.  Next A1C anticipated April-May 2018  ASCVD risk greater than 7.5%. Continued Aspirin 81 mg and Continued rosuvastatin 5 mg every other day.   Hypertension longstanding/newly diagnosed currently well controlled with reading of 138/78 today.  Patient  reports adherence with medication.  Written patient instructions provided.  Total time in face to face counseling 20 minutes.   Follow up in Pharmacist Clinic Visit per Dr. Burr Medico and according to tolerability of Victoza.   Patient seen with Myrna Blazer, PharmD Candidate, Ida Rogue, PharmD Candidate.

## 2016-09-08 NOTE — Assessment & Plan Note (Signed)
Diabetes longstanding currently moderately controlled in with an A1c of 8.2 in Jan 2018. Patient denies hypoglycemic events and is able to verbalize appropriate hypoglycemia management plan. Patient reports adherence with medication. Control is suboptimal due to efforts to find and titrate a suitable GLP-1 agent. Patient does not tolerate Trulicity 6.72 mg due to nausea and vomiting side effects.  Discontinued Trulicity d/t N/V side effects. Started Victoza (liraglutide) 0.6 mg once daily x 1 week, then 1.2 mg daily x 1 week if tolerated. May continue 1.2 mg if tolerated. If 1.2 is NOT tolerated will revert to 0.6 mg daily x 1 week then increase by one click per week as tolerated.  Next A1C anticipated April-May 2018

## 2016-09-08 NOTE — Patient Instructions (Addendum)
Thank you for coming in to see [name] today!  For the first week of using your Viztoza inject 0.6 mg once daily. On the second week try using 1.2 mg once daily. If you feel nauseous go back down to using 0.6 mg daily for one week. After that try going up one click every week from 0.6 mg. You may give all of your injections all the same time. If you want to give yourself the injections at different times give yourself the Basaglar in the morning and the Victoza in the evening.   Keep using the same insulin doses as you were before this appointment.  Your next visit is with Dr. Burr Medico in April. Your next visit with Dr. Valentina Lucks will be in late April or early May according to how well you are doing.

## 2016-09-08 NOTE — Progress Notes (Signed)
Patient ID: Alicia [name], female   DOB: 13-Aug-1955, 61 y.o.   MRN: [id number] Reviewed: Agree with Dr. Graylin Shiver documentation and management.

## 2016-09-12 ENCOUNTER — Encounter: Payer: Self-pay | Admitting: Physical Therapy

## 2016-09-12 ENCOUNTER — Ambulatory Visit: Payer: Medicare Other | Admitting: Physical Therapy

## 2016-09-12 DIAGNOSIS — M25562 Pain in left knee: Secondary | ICD-10-CM | POA: Diagnosis not present

## 2016-09-12 DIAGNOSIS — M6281 Muscle weakness (generalized): Secondary | ICD-10-CM | POA: Diagnosis not present

## 2016-09-12 NOTE — Therapy (Signed)
[place], [place], [address] Phone: [phone]   Fax:  [phone]  Physical Therapy Treatment  Patient Details  Name: Alicia [name] MRN: [id number] Date of Birth: March 23, 1956 Referring Provider: Kathryne Hitch, MD  Encounter Date: 09/12/2016      PT End of Session - 09/12/16 1300    Visit Number 6   Number of Visits 13   Date for PT Re-Evaluation 10/06/16   PT Start Time 5397   PT Stop Time 1230   PT Time Calculation (min) 55 min   Activity Tolerance Patient tolerated treatment well   Behavior During Therapy [facility] for tasks assessed/performed      Past Medical History:  Diagnosis Date  . Anemia   . Complete deafness    Meningitis at age 49  . Deaf   . Diabetes mellitus   . Diabetic neuropathy ([place])   . Ganglion cyst 06/22/2011  . Gastroparesis   . GERD (gastroesophageal reflux disease)   . H/O: C-section   . Hyperlipidemia   . Hypertension   . Neuromuscular disorder ([place])    diabetic neuropathy  . S/P appy     Past Surgical History:  Procedure Laterality Date  . APPENDECTOMY    . cardiolyte EF 77%, no ischemia in 2006  2006  . CARPAL TUNNEL RELEASE Right 04/20/2015   Procedure: RIGHT CARPAL TUNNEL RELEASE;  Surgeon: Daryll Brod, MD;  Location: [place];  Service: Orthopedics;  Laterality: Right;  . CARPAL TUNNEL RELEASE Left 11/04/2015   Procedure: CARPAL TUNNEL RELEASE;  Surgeon: Daryll Brod, MD;  Location: [place];  Service: Orthopedics;  Laterality: Left;  . CESAREAN SECTION    . OPEN REDUCTION INTERNAL FIXATION (ORIF) DISTAL RADIAL FRACTURE Left 11/04/2015   Procedure: OPEN REDUCTION INTERNAL FIXATION (ORIF) LEFT DISTAL RADIAL FRACTURE POSSIBLE BONE GRAFT;  Surgeon: Daryll Brod, MD;  Location: [place];  Service: Orthopedics;  Laterality: Left;  . TRIGGER FINGER RELEASE Right 04/20/2015   Procedure: RELEASE TRIGGER FINGER/A-1 PULLEY RIGHT  MIDDLE FINGER,RIGHT RING FINGER;  Surgeon: Daryll Brod, MD;  Location: [place];  Service: Orthopedics;  Laterality: Right;  . TUBAL LIGATION    . ULNAR NERVE TRANSPOSITION Right 04/20/2015   Procedure: RIGHT ULNAR NERVE DECOMPRESSION;  Surgeon: Daryll Brod, MD;  Location: [place];  Service: Orthopedics;  Laterality: Right;  . UTERINE FIBROID EMBOLIZATION  2007  . WRIST SURGERY     Cyst removed on left  . WRIST SURGERY Right 1985   tendon repair R wrist    There were no vitals filed for this visit.      Subjective Assessment - 09/12/16 1130    Subjective Pain Sat,  was up to 7/10.  I do get some pain with exercises.   She has not noticed pain with getting in/out of the car lately.  No swelling noticed.     Patient is accompained by: Interpreter  Juliann Pulse   Currently in Pain? No/denies   Pain Score --  up to 7/10   Pain Location Knee   Pain Orientation Left;Mid   Pain Descriptors / Indicators Aching;Sore   Aggravating Factors  exercises.  Activity with grandchilders,  lifting chasing after them.    Pain Relieving Factors meds ,, ice            [facility] PT Assessment - 09/12/16 0001      AROM   Left Knee Flexion 124  pain  [place] Adult PT Treatment/Exercise - 09/12/16 0001      Knee/Hip Exercises: Stretches   Passive Hamstring Stretch 3 reps;30 seconds   Passive Hamstring Stretch Limitations supine green strap  left knee pain with flexion 5/10 with stretch right hamstrin   Gastroc Stretch 2 reps;30 seconds   Gastroc Stretch Limitations slant board     Knee/Hip Exercises: Aerobic   Nustep L4 6 minutes     Knee/Hip Exercises: Standing   Knee Flexion Limitations 10 x 2 sets cued to keep knee slightly flexed   Terminal Knee Extension Limitations 10 x 5 second holds, pressing ball,  both ,  heavy cues.   Wall Squat 1 set;10 reps;5 seconds   Wall Squat Limitations cues to press heels.  avoid pain   SLS 3X  holding both,  wobbles,  cues to have core tight.   Other Standing Knee Exercises tip toe walking close SBA min assist to keep from falling     Knee/Hip Exercises: Sidelying   Hip ABduction 15 reps;Both   Hip ABduction Limitations abduct, knee flex, ext, lower  cues initially.  more difficult  right     Cryotherapy   Number Minutes Cryotherapy 10 Minutes   Cryotherapy Location Knee   Type of Cryotherapy --  cold pack                  PT Short Term Goals - 09/12/16 1305      PT SHORT TERM GOAL #1   Title Pain spiking to <=7/10 to decrease pain effects on daily activities by 3/16   Baseline spikes to 7/10 (over weekend)   Time 3   Period Weeks   Status On-going     PT SHORT TERM GOAL #2   Title Independent with HEP as it has been established   Baseline minor cues   Time 3   Period Weeks   Status On-going           PT Long Term Goals - 08/22/16 1639      PT LONG TERM GOAL #1   Title Pt will demo increased step width in gait  to decrease lateral LOB by 4/6   Baseline scissoring gait and frequent LOB at eval   Time 6   Period Weeks   Status New     PT LONG TERM GOAL #2   Title Pt will be able to sleep through the night witout being woken by pain   Baseline woken multiple times at eval   Time 6   Period Weeks   Status New     PT LONG TERM GOAL #3   Title will be able to get into/out of car without limitation by knee   Baseline severe limitation at eval   Time 6   Period Weeks   Status New     PT LONG TERM GOAL #4   Title will be able to stand from chair throughout the day without limitation by knee   Baseline severe limitation at eval   Time 6   Period Weeks   Status New             Patient will benefit from skilled therapeutic intervention in order to improve the following deficits and impairments:     Visit Diagnosis: Acute pain of left knee  Muscle weakness (generalized)     Problem List Patient Active Problem List    Diagnosis Date Noted  . OSA (obstructive sleep apnea) 08/31/2016  . Chronic headaches 08/10/2016  .  Dysphagia 07/13/2016  . Abdominal pain, epigastric 07/11/2016  . Knee pain, left 06/12/2016  . Lumbar strain, initial encounter 05/05/2016  . Injury of left toe 05/05/2016  . Difficulty sleeping 03/07/2016  . Healthcare maintenance 03/07/2016  . Pes anserine bursitis 12/21/2015  . Statin myopathy 01/21/2015  . Falls 12/08/2014  . New onset of headaches after age 56 12/08/2014  . Ataxia 11/13/2014  . Bilateral thigh pain 10/28/2014  . Myalgia 09/28/2014  . Seizure ([place]) 06/08/2014  . Trigger thumb of left hand 02/06/2014  . Carpal tunnel syndrome 01/05/2014  . Diabetic retinopathy ([place]) 07/21/2011  . Osteoarthritis of carpometacarpal joint of thumb 04/07/2011  . Gastroparesis 04/15/2010  . Asthma 04/04/2010  . ARTHRITIS, KNEE 09/17/2007  . Diabetic polyneuropathy ([place]) 08/30/2006  . Type 2 diabetes mellitus ([place]) 08/30/2006  . HYPERCHOLESTEROLEMIA 08/30/2006  . HEARING LOSS NOS OR DEAFNESS 08/30/2006  . Essential HTN 08/30/2006  . Allergic rhinitis 08/30/2006    HARRIS,KAREN PTA 09/12/2016, 1:08 PM  [facility] [address] [place], [place], [address] Phone: [phone]   Fax:  [phone]  Name: Alicia [name] MRN: [id number] Date of Birth: [birth year]

## 2016-09-13 ENCOUNTER — Encounter: Payer: Self-pay | Admitting: Pulmonary Disease

## 2016-09-13 ENCOUNTER — Ambulatory Visit (INDEPENDENT_AMBULATORY_CARE_PROVIDER_SITE_OTHER): Payer: Medicare Other | Admitting: Pulmonary Disease

## 2016-09-13 VITALS — BP 132/70 | HR 96 | Ht 63.0 in | Wt 172.8 lb

## 2016-09-13 DIAGNOSIS — G4733 Obstructive sleep apnea (adult) (pediatric): Secondary | ICD-10-CM

## 2016-09-13 DIAGNOSIS — Z7189 Other specified counseling: Secondary | ICD-10-CM | POA: Diagnosis not present

## 2016-09-13 NOTE — Progress Notes (Signed)
Current Outpatient Prescriptions on File Prior to Visit  Medication Sig  . ACCU-CHEK SOFTCLIX LANCETS lancets TEST 4 TIMES A DAY  . aspirin (BAYER CHILDRENS ASPIRIN) 81 MG chewable tablet Chew 81 mg by mouth daily.    . carvedilol (COREG) 12.5 MG tablet Take 1 tablet (12.5 mg total) by mouth 2 (two) times daily with a meal.  . cholecalciferol (VITAMIN D) 1000 UNITS tablet Take 1,000 Units by mouth daily.  . fluticasone (FLONASE) 50 MCG/ACT nasal spray Place 1 spray into both nostrils daily as needed.   . gabapentin (NEURONTIN) 400 MG capsule TAKE 3 CAPSULES BY MOUTH TWICE A DAY  . glucosamine-chondroitin 500-400 MG tablet Take 1 tablet by mouth daily.  Marland Kitchen glucose blood (ACCU-CHEK AVIVA PLUS) test strip CHECK SUGAR 4 TIMES A DAILY  . hydrochlorothiazide (HYDRODIURIL) 12.5 MG tablet Take 1 tablet (12.5 mg total) by mouth daily.  . insulin aspart (NOVOLOG) 100 UNIT/ML injection Inject 3 units with lunch and supper.  . Insulin Glargine (BASAGLAR KWIKPEN) 100 UNIT/ML SOPN Inject 0.08 mLs (8 Units total) into the skin daily.  . Insulin Pen Needle 31G X 5 MM MISC 1 Container by Does not apply route once as needed.  . liraglutide (VICTOZA) 18 MG/3ML SOPN Inject 0.1-0.2 mLs (0.6-1.2 mg total) into the skin daily.  Marland Kitchen losartan (COZAAR) 100 MG tablet TAKE 1 TABLET BY MOUTH EVERY DAY  . meloxicam (MOBIC) 15 MG tablet TAKE 1 TABLET BY MOUTH EVERY DAY  . metFORMIN (GLUCOPHAGE) 1000 MG tablet Take 1 tablet (1,000 mg total) by mouth 2 (two) times daily.  . pantoprazole (PROTONIX) 40 MG tablet TAKE 1 TABLET BY MOUTH EVERY DAY  . PATADAY 0.2 % SOLN PLACE 1 DROP INTO EACH EYE EVERY DAY AS NEEDED  . polyethylene glycol powder (GLYCOLAX/MIRALAX) powder TAKE 17 GRAMS MIXED IN LIQUID THREE TIMES A WEEK  . rosuvastatin (CRESTOR) 5 MG tablet TAKE 1 TABLET BY MOUTH EVERY OTHER DAY   No current facility-administered medications on file prior to visit.      Chief Complaint  Patient presents with  . Follow-up   discuss sleep study results     Tests Sleep study 08/23/16 >> AHI 6.6, SpO2 low 78%.  Past medical history Deaf after meningitis age 44, DM, neuropathy, GERD, gastroparesis, HLD, HTN  Past surgical history, Family history, Social history, Allergies all reviewed.  Vital Signs BP 132/70 (BP Location: Left Arm, Cuff Size: Normal)   Pulse 96   Ht 5\' 3"  (1.6 m)   Wt 172 lb 12.8 oz (78.4 kg)   SpO2 96%   BMI 30.61 kg/m   History of Present Illness Alicia [name] is a 61 y.o. female with obstructive sleep apnea  She had sleep study which showed mild sleep apnea.  Discussion conducted with assistance of sign language interpretor.   Physical Exam  General - pleasant Eyes - wears glasses ENT - no sinus tenderness, no oral exudate, no LAN, MP 4 Cardiac - regular, no murmur Chest - no wheeze Back - no tenderness Abd - soft, non tender Ext - no edema Neuro - normal strength Skin - no rashes Psych - normal mood   Assessment/Plan  Obstructive sleep apnea. - We discussed how sleep apnea can affect various health problems, including risks for hypertension, cardiovascular disease, and diabetes.  We also discussed how sleep disruption can increase risks for accidents, such as while driving.  Weight loss as a means of improving sleep apnea was also reviewed.  Additional treatment options discussed were  CPAP therapy, oral appliance, and surgical intervention. - will arrange for new auto CPAP set up   Patient Instructions  Will arrange for new CPAP set up  Follow up in 3 months    Chesley Mires, MD [place] [facility] Pager:  [phone] 09/13/2016, 10:55 AM

## 2016-09-13 NOTE — Patient Instructions (Signed)
Will arrange for new CPAP set up  Follow up in 3 months

## 2016-09-14 ENCOUNTER — Encounter: Payer: Self-pay | Admitting: Physical Therapy

## 2016-09-14 ENCOUNTER — Ambulatory Visit: Payer: Medicare Other | Admitting: Physical Therapy

## 2016-09-14 DIAGNOSIS — M6281 Muscle weakness (generalized): Secondary | ICD-10-CM

## 2016-09-14 DIAGNOSIS — M25562 Pain in left knee: Secondary | ICD-10-CM

## 2016-09-14 NOTE — Therapy (Signed)
[place], [place], [address] Phone: [phone]   Fax:  [phone]  Physical Therapy Treatment  Patient Details  Name: Alicia [name] MRN: [id number] Date of Birth: Jan 14, 1956 Referring Provider: Kathryne Hitch, MD  Encounter Date: 09/14/2016      PT End of Session - 09/14/16 0931    Visit Number 7   Number of Visits 13   Date for PT Re-Evaluation 10/06/16   Authorization Type MCR, KX at visit 15   PT Start Time 0931   PT Stop Time 1016   PT Time Calculation (min) 45 min   Activity Tolerance Patient tolerated treatment well   Behavior During Therapy [facility] for tasks assessed/performed      Past Medical History:  Diagnosis Date  . Anemia   . Complete deafness    Meningitis at age 61  . Deaf   . Diabetes mellitus   . Diabetic neuropathy ([place])   . Ganglion cyst 06/22/2011  . Gastroparesis   . GERD (gastroesophageal reflux disease)   . H/O: C-section   . Hyperlipidemia   . Hypertension   . Neuromuscular disorder ([place])    diabetic neuropathy  . S/P appy     Past Surgical History:  Procedure Laterality Date  . APPENDECTOMY    . cardiolyte EF 77%, no ischemia in 2006  2006  . CARPAL TUNNEL RELEASE Right 04/20/2015   Procedure: RIGHT CARPAL TUNNEL RELEASE;  Surgeon: Daryll Brod, MD;  Location: [place];  Service: Orthopedics;  Laterality: Right;  . CARPAL TUNNEL RELEASE Left 11/04/2015   Procedure: CARPAL TUNNEL RELEASE;  Surgeon: Daryll Brod, MD;  Location: [place];  Service: Orthopedics;  Laterality: Left;  . CESAREAN SECTION    . OPEN REDUCTION INTERNAL FIXATION (ORIF) DISTAL RADIAL FRACTURE Left 11/04/2015   Procedure: OPEN REDUCTION INTERNAL FIXATION (ORIF) LEFT DISTAL RADIAL FRACTURE POSSIBLE BONE GRAFT;  Surgeon: Daryll Brod, MD;  Location: [place];  Service: Orthopedics;  Laterality: Left;  . TRIGGER FINGER RELEASE Right 04/20/2015   Procedure: RELEASE TRIGGER FINGER/A-1 PULLEY RIGHT MIDDLE FINGER,RIGHT RING FINGER;  Surgeon: Daryll Brod, MD;  Location: [place];  Service: Orthopedics;  Laterality: Right;  . TUBAL LIGATION    . ULNAR NERVE TRANSPOSITION Right 04/20/2015   Procedure: RIGHT ULNAR NERVE DECOMPRESSION;  Surgeon: Daryll Brod, MD;  Location: [place];  Service: Orthopedics;  Laterality: Right;  . UTERINE FIBROID EMBOLIZATION  2007  . WRIST SURGERY     Cyst removed on left  . WRIST SURGERY Right 1985   tendon repair R wrist    There were no vitals filed for this visit.      Subjective Assessment - 09/14/16 0931    Subjective reports feeling a little sore today. for some reason, on sunday, knee was very painful. Monday was able to do some exercises. Placed ice on knee when it was hurting which helped a little. Feels discomfort along adductors   Patient Stated Goals getting into and out of car, sleep, extend knee, sit with knee bent, stand from chair   Currently in Pain? Yes   Pain Score 4    Pain Location Knee   Pain Orientation Medial;Left   Pain Descriptors / Indicators Aching;Sore            [facility] PT Assessment - 09/14/16 0001      Observation/Other Assessments   Focus on Therapeutic Outcomes (FOTO)  45% ability  AROM   Left Knee Flexion 125     Strength   Right Hip Flexion 4+/5   Left Hip Flexion 4+/5                     [facility] Adult PT Treatment/Exercise - 09/14/16 0001      Knee/Hip Exercises: Aerobic   Stationary Bike L4 5 min     Knee/Hip Exercises: Sidelying   Hip ABduction 20 reps;10 reps   Hip ABduction Limitations leg extended     Manual Therapy   Manual Therapy Soft tissue mobilization   Soft tissue mobilization IASTM MCL (cross friction) and Adductors bilat                PT Education - 09/14/16 0936    Education provided Yes   Education Details exercise form/rationale, rationale for manual.    Person(s)  Educated Patient   Methods Demonstration;Tactile cues;Explanation   Comprehension Returned demonstration;Tactile cues required;Need further instruction          PT Short Term Goals - 09/14/16 0955      PT SHORT TERM GOAL #1   Title Pain spiking to <=7/10 to decrease pain effects on daily activities by 3/16   Baseline occasional spikes to 7/10   Status Achieved     PT SHORT TERM GOAL #2   Title Independent with HEP as it has been established   Baseline verbalized independence   Status Achieved           PT Long Term Goals - 09/14/16 0958      PT LONG TERM GOAL #1   Title Pt will demo increased step width in gait  to decrease lateral LOB by 4/6   Baseline occasional scissoring of R over L, approx every 5 steps, good step width   Status On-going     PT LONG TERM GOAL #2   Title Pt will be able to sleep through the night witout being woken by pain   Baseline able to sleep, pain in the morning   Status Achieved     PT LONG TERM GOAL #3   Title will be able to get into/out of car without limitation by knee   Baseline feels normal, does not even think about it   Status Achieved     PT LONG TERM GOAL #4   Title will be able to stand from chair throughout the day without limitation by knee   Baseline denies feeling limited unless she is sitting for a long time   Status On-going               Plan - 09/14/16 1015    Clinical Impression Statement Significant pain & tightness noted in bilateral adductors with IASTM and weakness in abductors leading to scissoring gait and irritation. Pt has made good progress toward goals and will be d/c to independent program next week.    PT Next Visit Plan d/c 3/22, IASTM   PT Home Exercise Plan prone hip ext with knee flexed, SAQ, long sitting hamstring/gastroc stretch, side stepping with band in squat, hip abd with knee flx/ext, standing HS curls, wall sit   Consulted and Agree with Plan of Care Patient      Patient will  benefit from skilled therapeutic intervention in order to improve the following deficits and impairments:     Visit Diagnosis: Acute pain of left knee  Muscle weakness (generalized)     Problem List Patient Active Problem List  Diagnosis Date Noted  . OSA (obstructive sleep apnea) 08/31/2016  . Chronic headaches 08/10/2016  . Dysphagia 07/13/2016  . Abdominal pain, epigastric 07/11/2016  . Knee pain, left 06/12/2016  . Lumbar strain, initial encounter 05/05/2016  . Injury of left toe 05/05/2016  . Difficulty sleeping 03/07/2016  . Healthcare maintenance 03/07/2016  . Pes anserine bursitis 12/21/2015  . Statin myopathy 01/21/2015  . Falls 12/08/2014  . New onset of headaches after age 53 12/08/2014  . Ataxia 11/13/2014  . Bilateral thigh pain 10/28/2014  . Myalgia 09/28/2014  . Seizure ([place]) 06/08/2014  . Trigger thumb of left hand 02/06/2014  . Carpal tunnel syndrome 01/05/2014  . Diabetic retinopathy ([place]) 07/21/2011  . Osteoarthritis of carpometacarpal joint of thumb 04/07/2011  . Gastroparesis 04/15/2010  . Asthma 04/04/2010  . ARTHRITIS, KNEE 09/17/2007  . Diabetic polyneuropathy ([place]) 08/30/2006  . Type 2 diabetes mellitus ([place]) 08/30/2006  . HYPERCHOLESTEROLEMIA 08/30/2006  . HEARING LOSS NOS OR DEAFNESS 08/30/2006  . Essential HTN 08/30/2006  . Allergic rhinitis 08/30/2006    Bren Borys C. Jadien Lehigh PT, DPT 09/14/16 10:19 AM   [facility] [address] [place], [place], [address] Phone: [phone]   Fax:  [phone]  Name: LASAUNDRA RICHE MRN: [id number] Date of Birth: Sep 23, 1955

## 2016-09-15 DIAGNOSIS — E113593 Type 2 diabetes mellitus with proliferative diabetic retinopathy without macular edema, bilateral: Secondary | ICD-10-CM | POA: Diagnosis not present

## 2016-09-15 DIAGNOSIS — H2513 Age-related nuclear cataract, bilateral: Secondary | ICD-10-CM | POA: Diagnosis not present

## 2016-09-19 ENCOUNTER — Ambulatory Visit: Payer: Medicare Other | Admitting: Physical Therapy

## 2016-09-19 ENCOUNTER — Encounter: Payer: Self-pay | Admitting: Physical Therapy

## 2016-09-19 DIAGNOSIS — M25562 Pain in left knee: Secondary | ICD-10-CM

## 2016-09-19 DIAGNOSIS — M6281 Muscle weakness (generalized): Secondary | ICD-10-CM

## 2016-09-19 NOTE — Therapy (Signed)
[place], [place], [address] Phone: [phone]   Fax:  [phone]  Physical Therapy Treatment  Patient Details  Name: Alicia [name] MRN: [id number] Date of Birth: [birth year] Referring Provider: Kathryne Hitch, MD  Encounter Date: 09/19/2016      PT End of Session - 09/19/16 1155    Visit Number 8   Number of Visits 13   Date for PT Re-Evaluation 10/06/16   Authorization Type MCR, KX at visit 15   PT Start Time 1150   PT Stop Time 1228   PT Time Calculation (min) 38 min   Activity Tolerance Patient tolerated treatment well   Behavior During Therapy [facility] for tasks assessed/performed      Past Medical History:  Diagnosis Date  . Anemia   . Complete deafness    Meningitis at age 29  . Deaf   . Diabetes mellitus   . Diabetic neuropathy ([place])   . Ganglion cyst 06/22/2011  . Gastroparesis   . GERD (gastroesophageal reflux disease)   . H/O: C-section   . Hyperlipidemia   . Hypertension   . Neuromuscular disorder ([place])    diabetic neuropathy  . S/P appy     Past Surgical History:  Procedure Laterality Date  . APPENDECTOMY    . cardiolyte EF 77%, no ischemia in 2006  2006  . CARPAL TUNNEL RELEASE Right 04/20/2015   Procedure: RIGHT CARPAL TUNNEL RELEASE;  Surgeon: Daryll Brod, MD;  Location: [place];  Service: Orthopedics;  Laterality: Right;  . CARPAL TUNNEL RELEASE Left 11/04/2015   Procedure: CARPAL TUNNEL RELEASE;  Surgeon: Daryll Brod, MD;  Location: [place];  Service: Orthopedics;  Laterality: Left;  . CESAREAN SECTION    . OPEN REDUCTION INTERNAL FIXATION (ORIF) DISTAL RADIAL FRACTURE Left 11/04/2015   Procedure: OPEN REDUCTION INTERNAL FIXATION (ORIF) LEFT DISTAL RADIAL FRACTURE POSSIBLE BONE GRAFT;  Surgeon: Daryll Brod, MD;  Location: [place];  Service: Orthopedics;  Laterality: Left;  . TRIGGER FINGER RELEASE Right 04/20/2015    Procedure: RELEASE TRIGGER FINGER/A-1 PULLEY RIGHT MIDDLE FINGER,RIGHT RING FINGER;  Surgeon: Daryll Brod, MD;  Location: [place];  Service: Orthopedics;  Laterality: Right;  . TUBAL LIGATION    . ULNAR NERVE TRANSPOSITION Right 04/20/2015   Procedure: RIGHT ULNAR NERVE DECOMPRESSION;  Surgeon: Daryll Brod, MD;  Location: [place];  Service: Orthopedics;  Laterality: Right;  . UTERINE FIBROID EMBOLIZATION  2007  . WRIST SURGERY     Cyst removed on left  . WRIST SURGERY Right 1985   tendon repair R wrist    There were no vitals filed for this visit.      Subjective Assessment - 09/19/16 1157    Subjective pt reports feeling pain in knee today, throbbing and difficulty sleeping. 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 sometimes has to carry two small children. Pt with bad headache today.    Patient Stated Goals getting into and out of car, sleep, extend knee, sit with knee bent, stand from chair   Currently in Pain? Yes   Pain Score 7    Pain Location Knee   Pain Orientation Left;Medial   Pain Descriptors / Indicators Throbbing                         [facility] Adult PT Treatment/Exercise - 09/19/16 0001      Knee/Hip  Exercises: Aerobic   Nustep L4 5 min     Knee/Hip Exercises: Standing   Heel Raises 20 reps   Heel Raises Limitations counter support   Functional Squat Limitations mini squats at counter     Knee/Hip Exercises: Supine   Straight Leg Raise with External Rotation 15 reps     Modalities   Modalities Ultrasound     Ultrasound   Ultrasound Location L MCL   Ultrasound Parameters pulsed 1w/cm2 5 min   Ultrasound Goals Pain     Manual Therapy   Manual Therapy Taping   Kinesiotex Ligament Correction     Kinesiotix   Ligament Correction MCL                PT Education - 09/19/16 1307    Education provided Yes   Education Details exercise form/rationale, kneeling/squatting  for child care irritating knee, rationale for ultrasound   Person(s) Educated Patient   Methods Explanation;Demonstration;Tactile cues;Verbal cues   Comprehension Verbalized understanding;Returned demonstration;Verbal cues required;Tactile cues required;Need further instruction          PT Short Term Goals - 09/14/16 0955      PT SHORT TERM GOAL #1   Title Pain spiking to <=7/10 to decrease pain effects on daily activities by 3/16   Baseline occasional spikes to 7/10   Status Achieved     PT SHORT TERM GOAL #2   Title Independent with HEP as it has been established   Baseline verbalized independence   Status Achieved           PT Long Term Goals - 09/14/16 0958      PT LONG TERM GOAL #1   Title Pt will demo increased step width in gait  to decrease lateral LOB by 4/6   Baseline occasional scissoring of R over L, approx every 5 steps, good step width   Status On-going     PT LONG TERM GOAL #2   Title Pt will be able to sleep through the night witout being woken by pain   Baseline able to sleep, pain in the morning   Status Achieved     PT LONG TERM GOAL #3   Title will be able to get into/out of car without limitation by knee   Baseline feels normal, does not even think about it   Status Achieved     PT LONG TERM GOAL #4   Title will be able to stand from chair throughout the day without limitation by knee   Baseline denies feeling limited unless she is sitting for a long time   Status On-going               Plan - 09/19/16 1307    Clinical Impression Statement Pt presented with script from MD stating "step up PT for Grade 2 MCL-no restrictions" Pt did not demo scissoring gait today indicating decreased tightness of adductors. Severe pain in knee but reports she has been babysitting her small grandchildren a lot. Pt had a bad headache today so we held exercises to a minimum. Asked pt to pay attn to how her knee feels now vs after removing tape.    PT Next  Visit Plan ultrasound effectiveness?, tape?   PT Home Exercise Plan prone hip ext with knee flexed, SAQ, long sitting hamstring/gastroc stretch, side stepping with band in squat, hip abd with knee flx/ext, standing HS curls, wall sit   Consulted and Agree with Plan of Care Patient  Patient will benefit from skilled therapeutic intervention in order to improve the following deficits and impairments:     Visit Diagnosis: Acute pain of left knee  Muscle weakness (generalized)       G-Codes - 18-Oct-2016 1311    Functional Assessment Tool Used (Outpatient Only) FOTO 45% ability, clinical judgement, pain reports 0-10 scale   Functional Limitation Mobility: Walking and moving around   Mobility: Walking and Moving Around Current Status (U9811) At least 60 percent but less than 80 percent impaired, limited or restricted   Mobility: Walking and Moving Around Goal Status [id number]) At least 20 percent but less than 40 percent impaired, limited or restricted      Problem List Patient Active Problem List   Diagnosis Date Noted  . OSA (obstructive sleep apnea) 08/31/2016  . Chronic headaches 08/10/2016  . Dysphagia 07/13/2016  . Abdominal pain, epigastric 07/11/2016  . Knee pain, left 06/12/2016  . Lumbar strain, initial encounter 05/05/2016  . Injury of left toe 05/05/2016  . Difficulty sleeping 03/07/2016  . Healthcare maintenance 03/07/2016  . Pes anserine bursitis 12/21/2015  . Statin myopathy 01/21/2015  . Falls 12/08/2014  . New onset of headaches after age 2 12/08/2014  . Ataxia 11/13/2014  . Bilateral thigh pain 10/28/2014  . Myalgia 09/28/2014  . Seizure ([place]) 06/08/2014  . Trigger thumb of left hand 02/06/2014  . Carpal tunnel syndrome 01/05/2014  . Diabetic retinopathy ([place]) 07/21/2011  . Osteoarthritis of carpometacarpal joint of thumb 04/07/2011  . Gastroparesis 04/15/2010  . Asthma 04/04/2010  . ARTHRITIS, KNEE 09/17/2007  . Diabetic polyneuropathy ([place]) 08/30/2006   . Type 2 diabetes mellitus ([place]) 08/30/2006  . HYPERCHOLESTEROLEMIA 08/30/2006  . HEARING LOSS NOS OR DEAFNESS 08/30/2006  . Essential HTN 08/30/2006  . Allergic rhinitis 08/30/2006    Taylormarie Register C. Youlanda Tomassetti PT, DPT 10/18/16 1:14 PM   [facility] [address] [place], [place], [address] Phone: [phone]   Fax:  [phone]  Name: JOSSLIN SANJUAN MRN: [id number] Date of Birth: [birth year]

## 2016-09-21 ENCOUNTER — Encounter: Payer: Self-pay | Admitting: Physical Therapy

## 2016-09-21 ENCOUNTER — Ambulatory Visit: Payer: Medicare Other | Admitting: Physical Therapy

## 2016-09-21 DIAGNOSIS — M6281 Muscle weakness (generalized): Secondary | ICD-10-CM | POA: Diagnosis not present

## 2016-09-21 DIAGNOSIS — M25562 Pain in left knee: Secondary | ICD-10-CM | POA: Diagnosis not present

## 2016-09-21 NOTE — Therapy (Signed)
[place], [place], [address] Phone: [phone]   Fax:  [phone]  Physical Therapy Treatment  Patient Details  Name: Alicia [name] MRN: [id number] Date of Birth: 06/12/1956 Referring Provider: Kathryne Hitch, MD  Encounter Date: 09/21/2016      PT End of Session - 09/21/16 1013    Visit Number 9   Number of Visits 13   Date for PT Re-Evaluation 10/06/16   Authorization Type MCR, KX at visit 15   PT Start Time 3428   PT Stop Time 1057   PT Time Calculation (min) 43 min   Activity Tolerance Patient tolerated treatment well   Behavior During Therapy [facility] for tasks assessed/performed      Past Medical History:  Diagnosis Date  . Anemia   . Complete deafness    Meningitis at age 27  . Deaf   . Diabetes mellitus   . Diabetic neuropathy ([place])   . Ganglion cyst 06/22/2011  . Gastroparesis   . GERD (gastroesophageal reflux disease)   . H/O: C-section   . Hyperlipidemia   . Hypertension   . Neuromuscular disorder ([place])    diabetic neuropathy  . S/P appy     Past Surgical History:  Procedure Laterality Date  . APPENDECTOMY    . cardiolyte EF 77%, no ischemia in 2006  2006  . CARPAL TUNNEL RELEASE Right 04/20/2015   Procedure: RIGHT CARPAL TUNNEL RELEASE;  Surgeon: Daryll Brod, MD;  Location: [place];  Service: Orthopedics;  Laterality: Right;  . CARPAL TUNNEL RELEASE Left 11/04/2015   Procedure: CARPAL TUNNEL RELEASE;  Surgeon: Daryll Brod, MD;  Location: [place];  Service: Orthopedics;  Laterality: Left;  . CESAREAN SECTION    . OPEN REDUCTION INTERNAL FIXATION (ORIF) DISTAL RADIAL FRACTURE Left 11/04/2015   Procedure: OPEN REDUCTION INTERNAL FIXATION (ORIF) LEFT DISTAL RADIAL FRACTURE POSSIBLE BONE GRAFT;  Surgeon: Daryll Brod, MD;  Location: [place];  Service: Orthopedics;  Laterality: Left;  . TRIGGER FINGER RELEASE Right 04/20/2015   Procedure: RELEASE TRIGGER FINGER/A-1 PULLEY RIGHT MIDDLE FINGER,RIGHT RING FINGER;  Surgeon: Daryll Brod, MD;  Location: [place];  Service: Orthopedics;  Laterality: Right;  . TUBAL LIGATION    . ULNAR NERVE TRANSPOSITION Right 04/20/2015   Procedure: RIGHT ULNAR NERVE DECOMPRESSION;  Surgeon: Daryll Brod, MD;  Location: [place];  Service: Orthopedics;  Laterality: Right;  . UTERINE FIBROID EMBOLIZATION  2007  . WRIST SURGERY     Cyst removed on left  . WRIST SURGERY Right 1985   tendon repair R wrist    There were no vitals filed for this visit.      Subjective Assessment - 09/21/16 1013    Subjective hurts just a little bit. feels that what we did was very helpful. was able to sleep better.    Currently in Pain? Yes   Pain Score 2    Pain Location Knee   Pain Orientation Left;Medial   Pain Descriptors / Indicators Sore                         [facility] Adult PT Treatment/Exercise - 09/21/16 0001      Knee/Hip Exercises: Standing   Heel Raises Limitations counter support   Abduction Limitations at diagonal, cues for core engagement   Functional Squat Limitations mini squats at counter     Knee/Hip Exercises: Supine   Bridges with Diona Foley  Squeeze 20 reps   Straight Leg Raise with External Rotation 15 reps     Knee/Hip Exercises: Sidelying   Hip ADduction Both;15 reps   Clams 30 each     Modalities   Modalities Ultrasound     Ultrasound   Ultrasound Location L MCL   Ultrasound Parameters pulsed 1w/cm2 5 min   Ultrasound Goals Pain     Manual Therapy   Manual Therapy Taping   Kinesiotex Ligament Correction     Kinesiotix   Ligament Correction MCL, L                PT Education - 09/21/16 1033    Education provided Yes   Education Details instructed in self application of K-tape to [facility]   Person(s) Educated Patient   Methods Explanation;Demonstration;Tactile cues;Verbal cues   Comprehension Verbalized  understanding;Returned demonstration;Verbal cues required;Tactile cues required;Need further instruction          PT Short Term Goals - 09/14/16 0955      PT SHORT TERM GOAL #1   Title Pain spiking to <=7/10 to decrease pain effects on daily activities by 3/16   Baseline occasional spikes to 7/10   Status Achieved     PT SHORT TERM GOAL #2   Title Independent with HEP as it has been established   Baseline verbalized independence   Status Achieved           PT Long Term Goals - 09/14/16 0958      PT LONG TERM GOAL #1   Title Pt will demo increased step width in gait  to decrease lateral LOB by 4/6   Baseline occasional scissoring of R over L, approx every 5 steps, good step width   Status On-going     PT LONG TERM GOAL #2   Title Pt will be able to sleep through the night witout being woken by pain   Baseline able to sleep, pain in the morning   Status Achieved     PT LONG TERM GOAL #3   Title will be able to get into/out of car without limitation by knee   Baseline feels normal, does not even think about it   Status Achieved     PT LONG TERM GOAL #4   Title will be able to stand from chair throughout the day without limitation by knee   Baseline denies feeling limited unless she is sitting for a long time   Status On-going               Plan - 09/21/16 1058    Clinical Impression Statement pt denied any pain following treatment today. I asked her to go purchase ktape and try applying herself so that I can teach her again if it does not feel correct.    PT Next Visit Plan ultrasound prn, tape?   PT Home Exercise Plan prone hip ext with knee flexed, SAQ, long sitting hamstring/gastroc stretch, side stepping with band in squat, hip abd with knee flx/ext, standing HS curls, wall sit, mini squats at counter, heel raises at counter   Consulted and Agree with Plan of Care Patient      Patient will benefit from skilled therapeutic intervention in order to improve  the following deficits and impairments:     Visit Diagnosis: Acute pain of left knee  Muscle weakness (generalized)     Problem List Patient Active Problem List   Diagnosis Date Noted  . OSA (obstructive sleep apnea) 08/31/2016  .  Chronic headaches 08/10/2016  . Dysphagia 07/13/2016  . Abdominal pain, epigastric 07/11/2016  . Knee pain, left 06/12/2016  . Lumbar strain, initial encounter 05/05/2016  . Injury of left toe 05/05/2016  . Difficulty sleeping 03/07/2016  . Healthcare maintenance 03/07/2016  . Pes anserine bursitis 12/21/2015  . Statin myopathy 01/21/2015  . Falls 12/08/2014  . New onset of headaches after age 52 12/08/2014  . Ataxia 11/13/2014  . Bilateral thigh pain 10/28/2014  . Myalgia 09/28/2014  . Seizure ([place]) 06/08/2014  . Trigger thumb of left hand 02/06/2014  . Carpal tunnel syndrome 01/05/2014  . Diabetic retinopathy ([place]) 07/21/2011  . Osteoarthritis of carpometacarpal joint of thumb 04/07/2011  . Gastroparesis 04/15/2010  . Asthma 04/04/2010  . ARTHRITIS, KNEE 09/17/2007  . Diabetic polyneuropathy ([place]) 08/30/2006  . Type 2 diabetes mellitus ([place]) 08/30/2006  . HYPERCHOLESTEROLEMIA 08/30/2006  . HEARING LOSS NOS OR DEAFNESS 08/30/2006  . Essential HTN 08/30/2006  . Allergic rhinitis 08/30/2006  Cambell Stanek C. Deania Siguenza PT, DPT 09/21/16 12:04 PM   [facility] [address] [place], [place], [address] Phone: [phone]   Fax:  [phone]  Name: Alicia [name] MRN: [id number] Date of Birth: 10-05-1955

## 2016-09-26 ENCOUNTER — Ambulatory Visit: Payer: Medicare Other | Admitting: Physical Therapy

## 2016-09-26 ENCOUNTER — Encounter: Payer: Self-pay | Admitting: Physical Therapy

## 2016-09-26 DIAGNOSIS — M6281 Muscle weakness (generalized): Secondary | ICD-10-CM | POA: Diagnosis not present

## 2016-09-26 DIAGNOSIS — M25562 Pain in left knee: Secondary | ICD-10-CM | POA: Diagnosis not present

## 2016-09-26 NOTE — Therapy (Signed)
[place], [place], [address] Phone: [phone]   Fax:  [phone]  Physical Therapy Treatment  Patient Details  Name: Alicia [name] MRN: [id number] Date of Birth: 1955/08/26 Referring Provider: Kathryne Hitch, MD  Encounter Date: 09/26/2016      PT End of Session - 09/26/16 1333    Visit Number 10   Number of Visits 13   Date for PT Re-Evaluation 10/06/16   Authorization Type MCR, KX at visit 15   PT Start Time 1332   PT Stop Time 1416   PT Time Calculation (min) 44 min   Activity Tolerance Patient tolerated treatment well   Behavior During Therapy [facility] for tasks assessed/performed      Past Medical History:  Diagnosis Date  . Anemia   . Complete deafness    Meningitis at age 89  . Deaf   . Diabetes mellitus   . Diabetic neuropathy ([place])   . Ganglion cyst 06/22/2011  . Gastroparesis   . GERD (gastroesophageal reflux disease)   . H/O: C-section   . Hyperlipidemia   . Hypertension   . Neuromuscular disorder ([place])    diabetic neuropathy  . S/P appy     Past Surgical History:  Procedure Laterality Date  . APPENDECTOMY    . cardiolyte EF 77%, no ischemia in 2006  2006  . CARPAL TUNNEL RELEASE Right 04/20/2015   Procedure: RIGHT CARPAL TUNNEL RELEASE;  Surgeon: Daryll Brod, MD;  Location: [place];  Service: Orthopedics;  Laterality: Right;  . CARPAL TUNNEL RELEASE Left 11/04/2015   Procedure: CARPAL TUNNEL RELEASE;  Surgeon: Daryll Brod, MD;  Location: [place];  Service: Orthopedics;  Laterality: Left;  . CESAREAN SECTION    . OPEN REDUCTION INTERNAL FIXATION (ORIF) DISTAL RADIAL FRACTURE Left 11/04/2015   Procedure: OPEN REDUCTION INTERNAL FIXATION (ORIF) LEFT DISTAL RADIAL FRACTURE POSSIBLE BONE GRAFT;  Surgeon: Daryll Brod, MD;  Location: [place];  Service: Orthopedics;  Laterality: Left;  . TRIGGER FINGER RELEASE Right 04/20/2015   Procedure: RELEASE TRIGGER FINGER/A-1 PULLEY RIGHT MIDDLE FINGER,RIGHT RING FINGER;  Surgeon: Daryll Brod, MD;  Location: [place];  Service: Orthopedics;  Laterality: Right;  . TUBAL LIGATION    . ULNAR NERVE TRANSPOSITION Right 04/20/2015   Procedure: RIGHT ULNAR NERVE DECOMPRESSION;  Surgeon: Daryll Brod, MD;  Location: [place];  Service: Orthopedics;  Laterality: Right;  . UTERINE FIBROID EMBOLIZATION  2007  . WRIST SURGERY     Cyst removed on left  . WRIST SURGERY Right 1985   tendon repair R wrist    There were no vitals filed for this visit.      Subjective Assessment - 09/26/16 1333    Subjective Pt reports knee is doing okay. Pt reports having bad pain about an hour after her last session, she went home and took a nap and woke with pain. Reports the only thing that made the pain go away was tramadol. Reports icing before taking tramadol without help.    Patient Stated Goals getting into and out of car, sleep, extend knee, sit with knee bent, stand from chair   Currently in Pain? No/denies            [facility] PT Assessment - 09/26/16 0001      Strength   Right Hip Extension 5/5   Left Hip Extension 5/5   Right Knee Flexion 5/5   Left Knee Flexion 5/5  [place] Adult PT Treatment/Exercise - 09/26/16 0001      Knee/Hip Exercises: Seated   Sit to Sand 10 reps;without UE support  ball bw knees     Knee/Hip Exercises: Supine   Bridges with Ball Squeeze 15 reps     Knee/Hip Exercises: Sidelying   Hip ADduction 20 reps   Clams 20 each     Manual Therapy   Soft tissue mobilization IASTM MCL   Kinesiotex Ligament Correction     Kinesiotix   Ligament Correction MCL, L                PT Education - 09/26/16 1418    Education provided Yes   Education Details brace at night-avoid patellar hole, sprain resulting in MCL stretch, Adductor support and pain while relaxed at night, rationale for IASTM    Person(s) Educated Patient   Methods Explanation;Demonstration;Tactile cues;Verbal cues   Comprehension Verbalized understanding;Returned demonstration;Verbal cues required;Tactile cues required;Need further instruction          PT Short Term Goals - 09/14/16 0955      PT SHORT TERM GOAL #1   Title Pain spiking to <=7/10 to decrease pain effects on daily activities by 3/16   Baseline occasional spikes to 7/10   Status Achieved     PT SHORT TERM GOAL #2   Title Independent with HEP as it has been established   Baseline verbalized independence   Status Achieved           PT Long Term Goals - 09/14/16 0958      PT LONG TERM GOAL #1   Title Pt will demo increased step width in gait  to decrease lateral LOB by 4/6   Baseline occasional scissoring of R over L, approx every 5 steps, good step width   Status On-going     PT LONG TERM GOAL #2   Title Pt will be able to sleep through the night witout being woken by pain   Baseline able to sleep, pain in the morning   Status Achieved     PT LONG TERM GOAL #3   Title will be able to get into/out of car without limitation by knee   Baseline feels normal, does not even think about it   Status Achieved     PT LONG TERM GOAL #4   Title will be able to stand from chair throughout the day without limitation by knee   Baseline denies feeling limited unless she is sitting for a long time   Status On-going               Plan - 09/26/16 1420    Clinical Impression Statement Pt denies pain with daily activities except for sleeping. Discussed sleeping withpillow bw knees and bracing at night time. Pt has not purchased Ktape at this time. Pt has made significant improvements in strength which provides support during activity but when she relaxes while sleeping (she sleeps on her R) the L leg falls into a valgus position pulling on MCL.    PT Next Visit Plan adductor strength, IASTM   PT Home Exercise Plan prone hip ext with knee  flexed, SAQ, long sitting hamstring/gastroc stretch, side stepping with band in squat, hip abd with knee flx/ext, standing HS curls, wall sit, mini squats at counter, heel raises at counter   Consulted and Agree with Plan of Care Patient      Patient will benefit from skilled therapeutic intervention in order to improve the following  deficits and impairments:     Visit Diagnosis: Acute pain of left knee  Muscle weakness (generalized)     Problem List Patient Active Problem List   Diagnosis Date Noted  . OSA (obstructive sleep apnea) 08/31/2016  . Chronic headaches 08/10/2016  . Dysphagia 07/13/2016  . Abdominal pain, epigastric 07/11/2016  . Knee pain, left 06/12/2016  . Lumbar strain, initial encounter 05/05/2016  . Injury of left toe 05/05/2016  . Difficulty sleeping 03/07/2016  . Healthcare maintenance 03/07/2016  . Pes anserine bursitis 12/21/2015  . Statin myopathy 01/21/2015  . Falls 12/08/2014  . New onset of headaches after age 46 12/08/2014  . Ataxia 11/13/2014  . Bilateral thigh pain 10/28/2014  . Myalgia 09/28/2014  . Seizure ([place]) 06/08/2014  . Trigger thumb of left hand 02/06/2014  . Carpal tunnel syndrome 01/05/2014  . Diabetic retinopathy ([place]) 07/21/2011  . Osteoarthritis of carpometacarpal joint of thumb 04/07/2011  . Gastroparesis 04/15/2010  . Asthma 04/04/2010  . ARTHRITIS, KNEE 09/17/2007  . Diabetic polyneuropathy ([place]) 08/30/2006  . Type 2 diabetes mellitus ([place]) 08/30/2006  . HYPERCHOLESTEROLEMIA 08/30/2006  . HEARING LOSS NOS OR DEAFNESS 08/30/2006  . Essential HTN 08/30/2006  . Allergic rhinitis 08/30/2006    Yarnell Arvidson C. Talbert Trembath PT, DPT 09/26/16 2:24 PM   [facility] [address] [place], [place], [address] Phone: [phone]   Fax:  [phone]  Name: Alicia [name] MRN: [id number] Date of Birth: 07-08-1955

## 2016-09-27 ENCOUNTER — Other Ambulatory Visit: Payer: Self-pay | Admitting: Student in an Organized Health Care Education/Training Program

## 2016-09-28 ENCOUNTER — Other Ambulatory Visit: Payer: Self-pay | Admitting: Student in an Organized Health Care Education/Training Program

## 2016-09-28 ENCOUNTER — Ambulatory Visit: Payer: Medicare Other | Admitting: Physical Therapy

## 2016-09-28 ENCOUNTER — Encounter: Payer: Self-pay | Admitting: Physical Therapy

## 2016-09-28 DIAGNOSIS — M25562 Pain in left knee: Secondary | ICD-10-CM

## 2016-09-28 DIAGNOSIS — M6281 Muscle weakness (generalized): Secondary | ICD-10-CM | POA: Diagnosis not present

## 2016-09-28 DIAGNOSIS — Z1231 Encounter for screening mammogram for malignant neoplasm of breast: Secondary | ICD-10-CM

## 2016-09-28 NOTE — Therapy (Signed)
[place], [place], [address] Phone: [phone]   Fax:  [phone]  Physical Therapy Treatment  Patient Details  Name: Alicia [name] MRN: [id number] Date of Birth: [birth year] Referring Provider: Kathryne Hitch, MD  Encounter Date: 09/28/2016      PT End of Session - 09/28/16 1012    Visit Number 11   Number of Visits 13   Date for PT Re-Evaluation 10/06/16   Authorization Type MCR, KX at visit 15   PT Start Time 1012   PT Stop Time 1056   PT Time Calculation (min) 44 min   Activity Tolerance Patient tolerated treatment well   Behavior During Therapy [facility] for tasks assessed/performed      Past Medical History:  Diagnosis Date  . Anemia   . Complete deafness    Meningitis at age 80  . Deaf   . Diabetes mellitus   . Diabetic neuropathy ([place])   . Ganglion cyst 06/22/2011  . Gastroparesis   . GERD (gastroesophageal reflux disease)   . H/O: C-section   . Hyperlipidemia   . Hypertension   . Neuromuscular disorder ([place])    diabetic neuropathy  . S/P appy     Past Surgical History:  Procedure Laterality Date  . APPENDECTOMY    . cardiolyte EF 77%, no ischemia in 2006  2006  . CARPAL TUNNEL RELEASE Right 04/20/2015   Procedure: RIGHT CARPAL TUNNEL RELEASE;  Surgeon: Daryll Brod, MD;  Location: [place];  Service: Orthopedics;  Laterality: Right;  . CARPAL TUNNEL RELEASE Left 11/04/2015   Procedure: CARPAL TUNNEL RELEASE;  Surgeon: Daryll Brod, MD;  Location: [place];  Service: Orthopedics;  Laterality: Left;  . CESAREAN SECTION    . OPEN REDUCTION INTERNAL FIXATION (ORIF) DISTAL RADIAL FRACTURE Left 11/04/2015   Procedure: OPEN REDUCTION INTERNAL FIXATION (ORIF) LEFT DISTAL RADIAL FRACTURE POSSIBLE BONE GRAFT;  Surgeon: Daryll Brod, MD;  Location: [place];  Service: Orthopedics;  Laterality: Left;  . TRIGGER FINGER RELEASE Right 04/20/2015   Procedure: RELEASE TRIGGER FINGER/A-1 PULLEY RIGHT MIDDLE FINGER,RIGHT RING FINGER;  Surgeon: Daryll Brod, MD;  Location: [place];  Service: Orthopedics;  Laterality: Right;  . TUBAL LIGATION    . ULNAR NERVE TRANSPOSITION Right 04/20/2015   Procedure: RIGHT ULNAR NERVE DECOMPRESSION;  Surgeon: Daryll Brod, MD;  Location: [place];  Service: Orthopedics;  Laterality: Right;  . UTERINE FIBROID EMBOLIZATION  2007  . WRIST SURGERY     Cyst removed on left  . WRIST SURGERY Right 1985   tendon repair R wrist    There were no vitals filed for this visit.      Subjective Assessment - 09/28/16 1012    Subjective Reports knee is feeling good today, denies pain over night the last couple days.    Patient Stated Goals getting into and out of car, sleep, extend knee, sit with knee bent, stand from chair   Currently in Pain? No/denies                         [facility] Adult PT Treatment/Exercise - 09/28/16 0001      Knee/Hip Exercises: Stretches   Passive Hamstring Stretch Limitations seated EOB   Gastroc Stretch 30 seconds   Gastroc Stretch Limitations slant board   Soleus Stretch 30 seconds   Soleus Stretch Limitations slant board     Knee/Hip Exercises: Aerobic   Stationary  Bike 5 min  cues to avoid hip ER     Knee/Hip Exercises: Standing   Heel Raises Limitations counter support   SLS bilat with counter support   Other Standing Knee Exercises tandem balance   Other Standing Knee Exercises hip width BOS with eyes closed     Knee/Hip Exercises: Seated   Sit to Sand 10 reps;without UE support  ball bw knees     Knee/Hip Exercises: Supine   Bridges with Ball Squeeze 15 reps   Straight Leg Raise with External Rotation 10 reps;2 sets     Knee/Hip Exercises: Sidelying   Hip ADduction Both;15 reps   Clams 20 each     Manual Therapy   Soft tissue mobilization IASTM MCL; static and with eccentric knee flexion   Kinesiotex Ligament  Correction     Kinesiotix   Ligament Correction MCL, L                  PT Short Term Goals - 09/14/16 0955      PT SHORT TERM GOAL #1   Title Pain spiking to <=7/10 to decrease pain effects on daily activities by 3/16   Baseline occasional spikes to 7/10   Status Achieved     PT SHORT TERM GOAL #2   Title Independent with HEP as it has been established   Baseline verbalized independence   Status Achieved           PT Long Term Goals - 09/14/16 0958      PT LONG TERM GOAL #1   Title Pt will demo increased step width in gait  to decrease lateral LOB by 4/6   Baseline occasional scissoring of R over L, approx every 5 steps, good step width   Status On-going     PT LONG TERM GOAL #2   Title Pt will be able to sleep through the night witout being woken by pain   Baseline able to sleep, pain in the morning   Status Achieved     PT LONG TERM GOAL #3   Title will be able to get into/out of car without limitation by knee   Baseline feels normal, does not even think about it   Status Achieved     PT LONG TERM GOAL #4   Title will be able to stand from chair throughout the day without limitation by knee   Baseline denies feeling limited unless she is sitting for a long time   Status On-going               Plan - 09/28/16 1056    Clinical Impression Statement Pt with mild medial knee soreness following exercises as expected but reported it was very minimal. Discussed knee brace for sleeping which she had not purchased but I told her she does not need to if she is not having pain.    PT Next Visit Plan adductor strength, IASTM, balance   PT Home Exercise Plan prone hip ext with knee flexed, SAQ, long sitting hamstring/gastroc stretch, side stepping with band in squat, hip abd with knee flx/ext, standing HS curls, wall sit, mini squats at counter, heel raises at counter   Consulted and Agree with Plan of Care Patient      Patient will benefit from skilled  therapeutic intervention in order to improve the following deficits and impairments:     Visit Diagnosis: Acute pain of left knee  Muscle weakness (generalized)     Problem List Patient Active  Problem List   Diagnosis Date Noted  . OSA (obstructive sleep apnea) 08/31/2016  . Chronic headaches 08/10/2016  . Dysphagia 07/13/2016  . Abdominal pain, epigastric 07/11/2016  . Knee pain, left 06/12/2016  . Lumbar strain, initial encounter 05/05/2016  . Injury of left toe 05/05/2016  . Difficulty sleeping 03/07/2016  . Healthcare maintenance 03/07/2016  . Pes anserine bursitis 12/21/2015  . Statin myopathy 01/21/2015  . Falls 12/08/2014  . New onset of headaches after age 35 12/08/2014  . Ataxia 11/13/2014  . Bilateral thigh pain 10/28/2014  . Myalgia 09/28/2014  . Seizure ([place]) 06/08/2014  . Trigger thumb of left hand 02/06/2014  . Carpal tunnel syndrome 01/05/2014  . Diabetic retinopathy ([place]) 07/21/2011  . Osteoarthritis of carpometacarpal joint of thumb 04/07/2011  . Gastroparesis 04/15/2010  . Asthma 04/04/2010  . ARTHRITIS, KNEE 09/17/2007  . Diabetic polyneuropathy ([place]) 08/30/2006  . Type 2 diabetes mellitus ([place]) 08/30/2006  . HYPERCHOLESTEROLEMIA 08/30/2006  . HEARING LOSS NOS OR DEAFNESS 08/30/2006  . Essential HTN 08/30/2006  . Allergic rhinitis 08/30/2006   Haiden Clucas C. Jamus Loving PT, DPT 09/28/16 10:58 AM   [facility] [address] [place], [place], [address] Phone: [phone]   Fax:  [phone]  Name: Alicia [name] MRN: [id number] Date of Birth: Feb 26, 1956

## 2016-10-02 ENCOUNTER — Other Ambulatory Visit: Payer: Self-pay | Admitting: *Deleted

## 2016-10-03 ENCOUNTER — Ambulatory Visit: Payer: Medicare Other | Attending: Orthopedic Surgery | Admitting: Rehabilitative and Restorative Service Providers"

## 2016-10-03 DIAGNOSIS — M6281 Muscle weakness (generalized): Secondary | ICD-10-CM | POA: Diagnosis not present

## 2016-10-03 DIAGNOSIS — M25562 Pain in left knee: Secondary | ICD-10-CM | POA: Insufficient documentation

## 2016-10-03 MED ORDER — POLYETHYLENE GLYCOL 3350 17 GM/SCOOP PO POWD: ORAL | 3 refills | Status: DC

## 2016-10-03 NOTE — Therapy (Signed)
[place], [place], [address] Phone: [phone]   Fax:  [phone]  Physical Therapy Treatment  Patient Details  Name: Alicia [name] MRN: [id number] Date of Birth: [birth year] Referring Provider: Kathryne Hitch, MD  Encounter Date: 10/03/2016      PT End of Session - 10/03/16 1301    Visit Number 12   Number of Visits 13   Date for PT Re-Evaluation 10/06/16   Authorization Type MCR, KX at visit 15   PT Start Time 1157   PT Stop Time 1242   PT Time Calculation (min) 45 min   Activity Tolerance Patient tolerated treatment well   Behavior During Therapy [facility] for tasks assessed/performed      Past Medical History:  Diagnosis Date  . Anemia   . Complete deafness    Meningitis at age 53  . Deaf   . Diabetes mellitus   . Diabetic neuropathy ([place])   . Ganglion cyst 06/22/2011  . Gastroparesis   . GERD (gastroesophageal reflux disease)   . H/O: C-section   . Hyperlipidemia   . Hypertension   . Neuromuscular disorder ([place])    diabetic neuropathy  . S/P appy     Past Surgical History:  Procedure Laterality Date  . APPENDECTOMY    . cardiolyte EF 77%, no ischemia in 2006  2006  . CARPAL TUNNEL RELEASE Right 04/20/2015   Procedure: RIGHT CARPAL TUNNEL RELEASE;  Surgeon: Daryll Brod, MD;  Location: [place];  Service: Orthopedics;  Laterality: Right;  . CARPAL TUNNEL RELEASE Left 11/04/2015   Procedure: CARPAL TUNNEL RELEASE;  Surgeon: Daryll Brod, MD;  Location: [place];  Service: Orthopedics;  Laterality: Left;  . CESAREAN SECTION    . OPEN REDUCTION INTERNAL FIXATION (ORIF) DISTAL RADIAL FRACTURE Left 11/04/2015   Procedure: OPEN REDUCTION INTERNAL FIXATION (ORIF) LEFT DISTAL RADIAL FRACTURE POSSIBLE BONE GRAFT;  Surgeon: Daryll Brod, MD;  Location: [place];  Service: Orthopedics;  Laterality: Left;  . TRIGGER FINGER RELEASE Right 04/20/2015   Procedure: RELEASE TRIGGER FINGER/A-1 PULLEY RIGHT MIDDLE FINGER,RIGHT RING FINGER;  Surgeon: Daryll Brod, MD;  Location: [place];  Service: Orthopedics;  Laterality: Right;  . TUBAL LIGATION    . ULNAR NERVE TRANSPOSITION Right 04/20/2015   Procedure: RIGHT ULNAR NERVE DECOMPRESSION;  Surgeon: Daryll Brod, MD;  Location: [place];  Service: Orthopedics;  Laterality: Right;  . UTERINE FIBROID EMBOLIZATION  2007  . WRIST SURGERY     Cyst removed on left  . WRIST SURGERY Right 1985   tendon repair R wrist    There were no vitals filed for this visit.      Subjective Assessment - 10/03/16 1202    Subjective No pain this weekend or today; only soreness from doing my HEP.   Patient is accompained by: Interpreter   How long can you walk comfortably? no pain with walking   Patient Stated Goals to continue to get stronger                         [facility] Adult PT Treatment/Exercise - 10/03/16 0001      Neuro Re-ed    Neuro Re-ed Details  tandem stance static hold and then stepping forward and backward; lateral side steps x 12 ft x 4 bouts; retro gait x 18 ft x 3 bouts. PT CGA for all for safety  SLS 2x1 min each with  fingertip assist     Knee/Hip Exercises: Supine   Other Supine Knee/Hip Exercises SLR x 20, VMO SLR x 20, SLR/hip IR combo 2x10; bridge with heels only down x 20, L unilat bridge x 15,  R sidelying L clam shell with foot and knee elevating;; L hip abdct in R sidelying x 20; L SLR with ER hip circle x 20; all performed to L LE unless indicated.                  PT Short Term Goals - 09/14/16 0955      PT SHORT TERM GOAL #1   Title Pain spiking to <=7/10 to decrease pain effects on daily activities by 3/16   Baseline occasional spikes to 7/10   Status Achieved     PT SHORT TERM GOAL #2   Title Independent with HEP as it has been established   Baseline verbalized independence   Status Achieved           PT  Long Term Goals - 10/03/16 1211      PT LONG TERM GOAL #1   Title Pt will demo increased step width in gait  to decrease lateral LOB by 4/6   Baseline occasional scissoring of R over L, approx every 5 steps, good step width   Time 6   Period Weeks   Status On-going     PT LONG TERM GOAL #4   Title will be able to stand from chair throughout the day without limitation by knee   Baseline denies feeling limited unless she is sitting for a long time   Time 6   Period Weeks   Status Achieved               Plan - 10/03/16 1257    Clinical Impression Statement Pt reports no knee pain with functional activities or HEP; does report increased bil ITB soreness from doing HEP this weekend. advised pt to use heat to help with soreness for 10-15 min.   Rehab Potential Excellent   PT Frequency 2x / week   PT Duration 6 weeks   PT Treatment/Interventions Therapeutic exercise   PT Next Visit Plan balance, review HEP, DC next visit if no change   PT Home Exercise Plan prone hip ext with knee flexed, SAQ, long sitting hamstring/gastroc stretch, side stepping with band in squat, hip abd with knee flx/ext, standing HS curls, wall sit, mini squats at counter, heel raises at counter   Consulted and Agree with Plan of Care Patient      Patient will benefit from skilled therapeutic intervention in order to improve the following deficits and impairments:  Abnormal gait, Decreased range of motion, Difficulty walking, Increased muscle spasms, Decreased endurance, Decreased activity tolerance, Pain, Impaired flexibility, Decreased strength  Visit Diagnosis: Acute pain of left knee  Muscle weakness (generalized)     Problem List Patient Active Problem List   Diagnosis Date Noted  . OSA (obstructive sleep apnea) 08/31/2016  . Chronic headaches 08/10/2016  . Dysphagia 07/13/2016  . Abdominal pain, epigastric 07/11/2016  . Knee pain, left 06/12/2016  . Lumbar strain, initial encounter  05/05/2016  . Injury of left toe 05/05/2016  . Difficulty sleeping 03/07/2016  . Healthcare maintenance 03/07/2016  . Pes anserine bursitis 12/21/2015  . Statin myopathy 01/21/2015  . Falls 12/08/2014  . New onset of headaches after age 2 12/08/2014  . Ataxia 11/13/2014  . Bilateral thigh pain 10/28/2014  . Myalgia 09/28/2014  . Seizure (  [place]) 06/08/2014  . Trigger thumb of left hand 02/06/2014  . Carpal tunnel syndrome 01/05/2014  . Diabetic retinopathy ([place]) 07/21/2011  . Osteoarthritis of carpometacarpal joint of thumb 04/07/2011  . Gastroparesis 04/15/2010  . Asthma 04/04/2010  . ARTHRITIS, KNEE 09/17/2007  . Diabetic polyneuropathy ([place]) 08/30/2006  . Type 2 diabetes mellitus ([place]) 08/30/2006  . HYPERCHOLESTEROLEMIA 08/30/2006  . HEARING LOSS NOS OR DEAFNESS 08/30/2006  . Essential HTN 08/30/2006  . Allergic rhinitis 08/30/2006    Myra Rude, PT 10/03/2016, 1:07 PM  [facility] [address] [place], [place], [address] Phone: [phone]   Fax:  [phone]  Name: Alicia [name] MRN: [id number] Date of Birth: 02-04-1956

## 2016-10-03 NOTE — Patient Instructions (Addendum)
Advised pt to continue same HEP previously issued. Pt in agreement thru interpreter.Discussed DC next visit; pt has met all goals except one.

## 2016-10-05 ENCOUNTER — Ambulatory Visit: Payer: Medicare Other | Admitting: Physical Therapy

## 2016-10-05 ENCOUNTER — Encounter: Payer: Self-pay | Admitting: Physical Therapy

## 2016-10-05 DIAGNOSIS — M25562 Pain in left knee: Secondary | ICD-10-CM

## 2016-10-05 DIAGNOSIS — M6281 Muscle weakness (generalized): Secondary | ICD-10-CM | POA: Diagnosis not present

## 2016-10-05 NOTE — Therapy (Signed)
[place], [place], [address] Phone: [phone]   Fax:  [phone]  Physical Therapy Treatment/Discharge Summary  Patient Details  Name: Alicia [name] MRN: [id number] Date of Birth: 15-Jun-1956 Referring Provider: Kathryne Hitch, MD  Encounter Date: 10/05/2016      PT End of Session - 10/05/16 1010    Visit Number 13   Number of Visits 13   Date for PT Re-Evaluation 10/06/16   Authorization Type MCR, KX at visit 15   PT Start Time 1016   PT Stop Time 1047   PT Time Calculation (min) 31 min   Activity Tolerance Patient tolerated treatment well   Behavior During Therapy [facility] for tasks assessed/performed      Past Medical History:  Diagnosis Date  . Anemia   . Complete deafness    Meningitis at age 54  . Deaf   . Diabetes mellitus   . Diabetic neuropathy ([place])   . Ganglion cyst 06/22/2011  . Gastroparesis   . GERD (gastroesophageal reflux disease)   . H/O: C-section   . Hyperlipidemia   . Hypertension   . Neuromuscular disorder ([place])    diabetic neuropathy  . S/P appy     Past Surgical History:  Procedure Laterality Date  . APPENDECTOMY    . cardiolyte EF 77%, no ischemia in 2006  2006  . CARPAL TUNNEL RELEASE Right 04/20/2015   Procedure: RIGHT CARPAL TUNNEL RELEASE;  Surgeon: Daryll Brod, MD;  Location: [place];  Service: Orthopedics;  Laterality: Right;  . CARPAL TUNNEL RELEASE Left 11/04/2015   Procedure: CARPAL TUNNEL RELEASE;  Surgeon: Daryll Brod, MD;  Location: [place];  Service: Orthopedics;  Laterality: Left;  . CESAREAN SECTION    . OPEN REDUCTION INTERNAL FIXATION (ORIF) DISTAL RADIAL FRACTURE Left 11/04/2015   Procedure: OPEN REDUCTION INTERNAL FIXATION (ORIF) LEFT DISTAL RADIAL FRACTURE POSSIBLE BONE GRAFT;  Surgeon: Daryll Brod, MD;  Location: [place];  Service: Orthopedics;  Laterality: Left;  . TRIGGER FINGER RELEASE Right  04/20/2015   Procedure: RELEASE TRIGGER FINGER/A-1 PULLEY RIGHT MIDDLE FINGER,RIGHT RING FINGER;  Surgeon: Daryll Brod, MD;  Location: [place];  Service: Orthopedics;  Laterality: Right;  . TUBAL LIGATION    . ULNAR NERVE TRANSPOSITION Right 04/20/2015   Procedure: RIGHT ULNAR NERVE DECOMPRESSION;  Surgeon: Daryll Brod, MD;  Location: [place];  Service: Orthopedics;  Laterality: Right;  . UTERINE FIBROID EMBOLIZATION  2007  . WRIST SURGERY     Cyst removed on left  . WRIST SURGERY Right 1985   tendon repair R wrist    There were no vitals filed for this visit.      Subjective Assessment - 10/05/16 1020    Subjective Pt is ready for d/c. Knee is feeling okay, has not had any pain recently. Just feels soreness.    Patient Stated Goals to continue to get stronger   Currently in Pain? No/denies            [facility] PT Assessment - 10/05/16 0001      Assessment   Medical Diagnosis L MCL almost healed   Referring Provider Kathryne Hitch, MD     AROM   Left Knee Flexion 125     Strength   Right Hip Extension 5/5   Left Hip Extension 5/5   Right Knee Flexion 5/5   Left Knee Flexion 5/5     Palpation   Palpation comment denies  TTP                     [facility] Adult PT Treatment/Exercise - 11-01-16 0001      Knee/Hip Exercises: Standing   Wall Squat Limitations with red tband clams     Knee/Hip Exercises: Sidelying   Hip ABduction Limitations with knee flx/ext, red tband     Knee/Hip Exercises: Prone   Hamstring Curl Limitations iso hamstring curl + hip extension                PT Education - 01-Nov-2016 1048    Education provided Yes   Education Details exercise form/rationale, HEP, soreness leading to increase in strength   Person(s) Educated Patient   Methods Explanation;Demonstration;Tactile cues   Comprehension Returned demonstration;Tactile cues required          PT Short Term Goals - 09/14/16 0955      PT  SHORT TERM GOAL #1   Title Pain spiking to <=7/10 to decrease pain effects on daily activities by 3/16   Baseline occasional spikes to 7/10   Status Achieved     PT SHORT TERM GOAL #2   Title Independent with HEP as it has been established   Baseline verbalized independence   Status Achieved           PT Long Term Goals - 11-01-16 1026      PT LONG TERM GOAL #1   Title Pt will demo increased step width in gait  to decrease lateral LOB by 4/6   Baseline occasionally R comes in front of L rather than scissoring across midline. no LOB noted today   Status Partially Met     PT LONG TERM GOAL #2   Title Pt will be able to sleep through the night witout being woken by pain   Baseline able   Status Achieved     PT LONG TERM GOAL #3   Title will be able to get into/out of car without limitation by knee   Baseline feels normal, does not even think about it   Status Achieved     PT LONG TERM GOAL #4   Title will be able to stand from chair throughout the day without limitation by knee   Baseline denies pain or difficulty   Status Achieved               Plan - 11/01/16 1048    Clinical Impression Statement Pt has made significant improvement sin strength, functional ability and pain reports. She will be d/c to independent program at this time for which she verbalized readiness and understanding. Instructed to contact us with any questions.    Consulted and Agree with Plan of Care Patient      Patient will benefit from skilled therapeutic intervention in order to improve the following deficits and impairments:  Abnormal gait, Decreased range of motion, Difficulty walking, Increased muscle spasms, Decreased endurance, Decreased activity tolerance, Pain, Impaired flexibility, Decreased strength  Visit Diagnosis: Acute pain of left knee  Muscle weakness (generalized)       G-Codes - 2016-11-01 1050    Functional Assessment Tool Used (Outpatient Only) clinical judgement,  pain reports 0-10 scale   Functional Limitation Mobility: Walking and moving around   Mobility: Walking and Moving Around Goal Status [id number]) At least 20 percent but less than 40 percent impaired, limited or restricted   Mobility: Walking and Moving Around Discharge Status [phone]) At least 20 percent but less than 40  percent impaired, limited or restricted      Problem List Patient Active Problem List   Diagnosis Date Noted  . OSA (obstructive sleep apnea) 08/31/2016  . Chronic headaches 08/10/2016  . Dysphagia 07/13/2016  . Abdominal pain, epigastric 07/11/2016  . Knee pain, left 06/12/2016  . Lumbar strain, initial encounter 05/05/2016  . Injury of left toe 05/05/2016  . Difficulty sleeping 03/07/2016  . Healthcare maintenance 03/07/2016  . Pes anserine bursitis 12/21/2015  . Statin myopathy 01/21/2015  . Falls 12/08/2014  . New onset of headaches after age 51 12/08/2014  . Ataxia 11/13/2014  . Bilateral thigh pain 10/28/2014  . Myalgia 09/28/2014  . Seizure ([place]) 06/08/2014  . Trigger thumb of left hand 02/06/2014  . Carpal tunnel syndrome 01/05/2014  . Diabetic retinopathy ([place]) 07/21/2011  . Osteoarthritis of carpometacarpal joint of thumb 04/07/2011  . Gastroparesis 04/15/2010  . Asthma 04/04/2010  . ARTHRITIS, KNEE 09/17/2007  . Diabetic polyneuropathy ([place]) 08/30/2006  . Type 2 diabetes mellitus ([place]) 08/30/2006  . HYPERCHOLESTEROLEMIA 08/30/2006  . HEARING LOSS NOS OR DEAFNESS 08/30/2006  . Essential HTN 08/30/2006  . Allergic rhinitis 08/30/2006  PHYSICAL THERAPY DISCHARGE SUMMARY  Visits from Start of Care: 13  Current functional level related to goals / functional outcomes: See above   Remaining deficits: See above   Education / Equipment: Anatomy of condition, POC, HEP, exercise form/rationale  Plan: Patient agrees to discharge.  Patient goals were met. Patient is being discharged due to meeting the stated rehab goals.  ?????    Adriella Essex C.  Tessie Ordaz PT, DPT 10/05/16 10:53 AM   [facility] [address] [place], [place], [address] Phone: [phone]   Fax:  [phone]  Name: Alicia [name] MRN: [id number] Date of Birth: 07-12-1955

## 2016-10-09 ENCOUNTER — Ambulatory Visit (INDEPENDENT_AMBULATORY_CARE_PROVIDER_SITE_OTHER): Payer: Medicare Other | Admitting: Student in an Organized Health Care Education/Training Program

## 2016-10-09 ENCOUNTER — Encounter: Payer: Self-pay | Admitting: Student in an Organized Health Care Education/Training Program

## 2016-10-09 VITALS — BP 138/80 | HR 76 | Temp 97.9°F | Ht 63.0 in | Wt 173.0 lb

## 2016-10-09 DIAGNOSIS — I10 Essential (primary) hypertension: Secondary | ICD-10-CM | POA: Diagnosis not present

## 2016-10-09 DIAGNOSIS — J00 Acute nasopharyngitis [common cold]: Secondary | ICD-10-CM | POA: Diagnosis not present

## 2016-10-09 DIAGNOSIS — Z794 Long term (current) use of insulin: Secondary | ICD-10-CM | POA: Diagnosis not present

## 2016-10-09 DIAGNOSIS — E119 Type 2 diabetes mellitus without complications: Secondary | ICD-10-CM

## 2016-10-09 DIAGNOSIS — E7800 Pure hypercholesterolemia, unspecified: Secondary | ICD-10-CM

## 2016-10-09 DIAGNOSIS — IMO0001 Reserved for inherently not codable concepts without codable children: Secondary | ICD-10-CM | POA: Insufficient documentation

## 2016-10-09 LAB — POCT GLYCOSYLATED HEMOGLOBIN (HGB A1C): Hemoglobin A1C: 8.2

## 2016-10-09 NOTE — Assessment & Plan Note (Signed)
HbA1c 8.2 - patient endorses hypoglycemic to 60 without symptoms - concern for patient becoming hypoglycemic with novolog, patient cannot give me a clear idea how how much she uses - she states she holds novolog for CBG<120 - I asked if she would hold novolog until seeing Dr. Valentina Lucks due to my concern for hypoglycemic episodes without symptoms - She stated she understands the risks but she also has had symptoms with hypoglycemic episodes in the past, that it only happened one time and that she would much prefer to continue novolog because without it her sugars go very high - discussed risks for hypoglycemia at length, patient expresses understanding - to schedule f/u w Dr. Valentina Lucks for insulin management on the way out of the office today

## 2016-10-09 NOTE — Assessment & Plan Note (Signed)
Controlled. No red flags Continue current regimen with carvedilol, losartan and HCTZ

## 2016-10-09 NOTE — Assessment & Plan Note (Signed)
Stable, no red flags - continue statin

## 2016-10-09 NOTE — Patient Instructions (Addendum)
It was a pleasure seeing you today in our clinic. Today we discussed Diabetes, Blood pressure, cholesterol. Here is the treatment plan we have discussed and agreed upon together:  Diabetes - Please continue your nighttime 8 units of insulin, but STOP THE 3-4 units of novolog you take with meals until you see Dr. Valentina Lucks - Please schedule a follow up visit with Dr. Valentina Lucks - Please record your blood sugars and bring this record and all medications to your visit with him - Continue Metformin and Victoza as you have been taking them.  Blood Pressure - Your pressure is controlled today. Please continue taking your current medications.  Constipation - Start with 1 capful of miralax daily, and increase to two capfuls of miralax daily as needed for 1-2 soft bowel movements per day. - For your episode of coldness and your constipation, we will check your thyroid with a blood test today. I will follow up with these results. If you do not receive a call from me in 2 weeks please call our office back.  Our clinic's number is [phone]. Please call with questions or concerns about what we discussed today.  Be well, Dr. Burr Medico

## 2016-10-09 NOTE — Progress Notes (Signed)
CC: HTN, Diabetes, HLD follow up  HPI: Alicia [name] is a 61 y.o. female with a complicated PMH significant for deafness, HTN, diabetes, HLD who presents to [facility] today for follow up  [place] - patient endorses one shivering episode the other night, sugar was WNL at that time, resolved on its own - also noted constipation over past several weeks - no palpitations - no hair changes  HYPERTENSION Disease Monitoring: Checks blood pressures at home Blood pressure range-usually 120-130 when checked at home, intermittently  Chest pain, palpitations- None       Dyspnea- None Medications: carvedilol 12.5 mg, Losartan 100 mg, HCTZ 12.5 mg QD Compliance- reports 100% compliance  Lightheadedness,Syncope- None     DIABETES Disease Monitoring: HbA1c Previously 8.2 , today 8.2 Blood Sugar ranges-Sometimes 300-400 at highest, 120 - wont take novolog if its this level Polyuria/phagia/dipsia- none       Visual problems- cataracts surgery in May Medications: Metformin 1000 mg BID, liraglutide (Victoza (01.2 mg QD), Insulin Glargine 8u QD, sliding scale 3-4u with meals Compliance- She reports 100% compliance with Victoza and metformin  Hypoglycemic symptoms- in the last month, lowest number maybe 60, no low blood sugar symptoms in the last month  HYPERLIPIDEMIA Disease Monitoring:  See symptoms for Hypertension Medications: Rosuvastatin 5 mg QD Compliance- 100%  Right upper quadrant pain- *None   Muscle aches- none  Monitoring Labs and Parameters Last A1C:  Lab Results  Component Value Date   HGBA1C 8.2 10/09/2016    Last Lipid:     Component Value Date/Time   CHOL 184 07/28/2016 0936   HDL 81 07/28/2016 0936   LDLDIRECT 78 07/10/2013 1441    Last Bmet  Potassium  Date Value Ref Range Status  07/28/2016 3.7 3.5 - 5.3 mmol/L Final   Sodium  Date Value Ref Range Status  07/28/2016 140 135 - 146 mmol/L Final   Creat  Date Value Ref Range Status  07/28/2016 0.75 0.50  - 0.99 mg/dL Final    Comment:      For patients > or = 61 years of age: The upper reference limit for Creatinine is approximately 13% higher for people identified as African-American.         Last BPs:  BP Readings from Last 3 Encounters:  10/09/16 138/80  09/13/16 132/70  09/08/16 138/78      Review of Symptoms:  See HPI for ROS.   CC, SH/smoking status, and VS noted.  Objective: BP 138/80   Pulse 76   Temp 97.9 F (36.6 C) (Oral)   Ht 5\' 3"  (1.6 m)   Wt 173 lb (78.5 kg)   SpO2 98%   BMI 30.65 kg/m  GEN: NAD, alert, cooperative, and pleasant. NECK: full ROM, no thyromegaly RESPIRATORY: clear to auscultation bilaterally with no wheezes, rhonchi or rales, good effort CV: RRR, no m/r/g, no peripheral edema GI: soft, non-tender, non-distended, normoactive bowel sounds, no hepatosplenomegaly  Assessment and plan:  HYPERCHOLESTEROLEMIA Stable, no red flags - continue statin  Cold 1 episode of subjective coldness, also with constipation - order TSH - miralax qD, titrate as needed for constipation - will F/u w results of TSH when they are available  Type 2 diabetes mellitus (HCC) HbA1c 8.2 - patient endorses hypoglycemic to 60 without symptoms - concern for patient becoming hypoglycemic with novolog, patient cannot give me a clear idea how how much she uses - she states she holds novolog for CBG<120 - I asked if she would hold novolog  until seeing Dr. Valentina Lucks due to my concern for hypoglycemic episodes without symptoms - She stated she understands the risks but she also has had symptoms with hypoglycemic episodes in the past, that it only happened one time and that she would much prefer to continue novolog because without it her sugars go very high - discussed risks for hypoglycemia at length, patient expresses understanding - to schedule f/u w Dr. Valentina Lucks for insulin management on the way out of the office today  Essential HTN Controlled. No red flags Continue  current regimen with carvedilol, losartan and HCTZ   Orders Placed This Encounter  Procedures  . TSH  . POCT glycosylated hemoglobin (Hb A1C)    No orders of the defined types were placed in this encounter.    Everrett Coombe, MD,MS,  PGY1 10/09/2016 5:14 PM

## 2016-10-09 NOTE — Assessment & Plan Note (Signed)
1 episode of subjective coldness, also with constipation - order TSH - miralax qD, titrate as needed for constipation - will F/u w results of TSH when they are available

## 2016-10-10 LAB — TSH: TSH: 1.75 u[IU]/mL (ref 0.450–4.500)

## 2016-10-12 ENCOUNTER — Ambulatory Visit: Payer: Self-pay | Admitting: Pharmacist

## 2016-10-17 ENCOUNTER — Encounter: Payer: Self-pay | Admitting: Student in an Organized Health Care Education/Training Program

## 2016-10-17 DIAGNOSIS — M25562 Pain in left knee: Secondary | ICD-10-CM | POA: Diagnosis not present

## 2016-10-20 ENCOUNTER — Ambulatory Visit
Admission: RE | Admit: 2016-10-20 | Discharge: 2016-10-20 | Disposition: A | Discharge status: Home / Self Care | Payer: Medicare Other | Source: Ambulatory Visit | Attending: Family Medicine | Admitting: Family Medicine

## 2016-10-20 DIAGNOSIS — Z1231 Encounter for screening mammogram for malignant neoplasm of breast: Secondary | ICD-10-CM

## 2016-10-25 ENCOUNTER — Other Ambulatory Visit: Payer: Self-pay | Admitting: Student in an Organized Health Care Education/Training Program

## 2016-10-25 DIAGNOSIS — E119 Type 2 diabetes mellitus without complications: Secondary | ICD-10-CM

## 2016-10-25 DIAGNOSIS — Z794 Long term (current) use of insulin: Principal | ICD-10-CM

## 2016-10-26 ENCOUNTER — Encounter: Payer: Self-pay | Admitting: Pharmacist

## 2016-10-26 ENCOUNTER — Ambulatory Visit (INDEPENDENT_AMBULATORY_CARE_PROVIDER_SITE_OTHER): Payer: Medicare Other | Admitting: Pharmacist

## 2016-10-26 ENCOUNTER — Other Ambulatory Visit: Payer: Self-pay | Admitting: Student in an Organized Health Care Education/Training Program

## 2016-10-26 DIAGNOSIS — Z794 Long term (current) use of insulin: Secondary | ICD-10-CM | POA: Diagnosis not present

## 2016-10-26 DIAGNOSIS — E119 Type 2 diabetes mellitus without complications: Secondary | ICD-10-CM

## 2016-10-26 MED ORDER — INSULIN ASPART 100 UNIT/ML ~~LOC~~ SOLN: 3.0000 [IU] | Freq: Two times a day (BID) | SUBCUTANEOUS | 1 refills | Status: DC

## 2016-10-26 NOTE — Assessment & Plan Note (Signed)
Diabetes longstanding moderately controlled with A1c of 8.2 and improved BG below 300. Patient denies hypoglycemic events and is able to verbalize appropriate hypoglycemia management plan. Patient reports adherence with medication. Control is suboptimal due to frequent nausea from Victoza and/or possible diabetic gastroparesis. Reduced Victoza dose by 3 clicks down from 1.2 mg. Continue to monitor for symptoms of nausea. Continue novolog 3-4 units with lunch and dinner and basaglar 8 units daily.

## 2016-10-26 NOTE — Patient Instructions (Addendum)
Reduce your Victoza dose slightly by reducing 3 CLICKS DOWN from the 1.2 mg daily you have been using.   We will continue to follow your blood sugars and your symptoms of nausea.     Blood sugar numbers are much improved.   No change in your insulin dosing today!  Let us know if your vomiting worsens.   Followup with Dr Burr Medico in 6 weeks.

## 2016-10-26 NOTE — Progress Notes (Signed)
    S:     Chief Complaint  Patient presents with  . Medication Management    Diabetes    Patient arrives in good spirits with her interpretor Brandi. Presents for diabetes evaluation, education, and management follow up.  Patient was last seen by Primary Care Provider on 10/09/2016. She reports having good blood sugar numbers since we last saw her. She reports having a "blackout" recently possibly due to dehydration. She had N/V and diarrhea for the past 3-4 days that ended yesterday. She reports have nausea 1-2x/week and feels bloated frequently.   Patient reports adherence with medications.  Current diabetes medications include: Metformin 1000 mg BID, Novolog 3-4 units with lunch and dinner, Basaglar 8 units daily, Victoza 1.2 mg daily Current hypertension medications include: Coreg 12.5 mg BID, HCTZ 12.5 mg daily, losartan 100 mg daily   Patient denies hypoglycemic events.   Patient denies nocturia.  Patient denies visual changes. Patient reports self foot exams.   O:  Physical Exam  Constitutional: She appears well-developed and well-nourished.  Vitals reviewed.    Review of Systems  Gastrointestinal: Positive for nausea and vomiting.     Lab Results  Component Value Date   HGBA1C 8.2 10/09/2016   Vitals:   10/26/16 1041  BP: (!) 148/64  Pulse: 93    Home fasting CBG: lowest 89 and 90, average AM 130-170, average PM 190-250   A/P: Diabetes longstanding moderately controlled with A1c of 8.2 and improved BG below 300. Patient denies hypoglycemic events and is able to verbalize appropriate hypoglycemia management plan. Patient reports adherence with medication. Control is suboptimal due to frequent nausea from Victoza and/or possible diabetic gastroparesis. Reduced Victoza dose by 3 clicks down from 1.2 mg. Continue to monitor for symptoms of nausea. Continue novolog 3-4 units with lunch and dinner and basaglar 8 units daily.   Next A1C anticipated at next PCP/clinic  visit.    Written patient instructions provided.  Total time in face to face counseling 30 minutes.   Follow up in 6 weeks with Dr. Burr Medico.   Patient seen with Ida Rogue, PharmD Candidate and Bennye Alm, PharmD, BCPS, PGY2 Resident.

## 2016-10-30 NOTE — Progress Notes (Signed)
Patient ID: Alicia [name], female   DOB: 1955/08/03, 61 y.o.   MRN: [id number] Reviewed: Agree with Dr. Graylin Shiver documentation and management.

## 2016-11-07 ENCOUNTER — Other Ambulatory Visit: Payer: Self-pay | Admitting: Student in an Organized Health Care Education/Training Program

## 2016-11-07 DIAGNOSIS — M792 Neuralgia and neuritis, unspecified: Secondary | ICD-10-CM

## 2016-11-10 ENCOUNTER — Other Ambulatory Visit: Payer: Self-pay | Admitting: *Deleted

## 2016-11-10 MED ORDER — LOSARTAN POTASSIUM 100 MG PO TABS: 100.0000 mg | ORAL_TABLET | Freq: Every day | ORAL | 3 refills | Status: DC

## 2016-11-29 ENCOUNTER — Other Ambulatory Visit: Payer: Self-pay | Admitting: Family Medicine

## 2016-11-29 ENCOUNTER — Other Ambulatory Visit: Payer: Self-pay | Admitting: Student in an Organized Health Care Education/Training Program

## 2016-11-29 DIAGNOSIS — I10 Essential (primary) hypertension: Secondary | ICD-10-CM

## 2016-12-04 ENCOUNTER — Ambulatory Visit: Payer: Medicare Other

## 2016-12-05 NOTE — Progress Notes (Deleted)
   CC: ***  HPI: Alicia [name] is a 61 y.o. female with PMH significant for diabetes follow up who presents to [facility] today with *** of *** duration.   Visit 4/26 w Koval: Reduced Victoza dose by 3 clicks down from 1.2 mg. Continue to monitor for symptoms of nausea. Continue novolog 3-4 units with lunch and dinner and basaglar 8 units daily.    HYPERTENSION Disease Monitoring: Blood pressure range-***  Chest pain, palpitations- ***       Dyspnea- *** Medications: Compliance- ***  Lightheadedness,Syncope- ***    Edema- ***  DIABETES Disease Monitoring: Blood Sugar ranges-***  Polyuria/phagia/dipsia- ***       Visual problems- *** Medications: Compliance- ***  Hypoglycemic symptoms- ***  HYPERLIPIDEMIA Disease Monitoring: See symptoms for Hypertension Medications: Compliance- ***  Right upper quadrant pain- ***   Muscle aches- ***  Monitoring Labs and Parameters Last A1C:  Lab Results  Component Value Date   HGBA1C 8.2 10/09/2016    Last Lipid:     Component Value Date/Time   CHOL 184 07/28/2016 0936   HDL 81 07/28/2016 0936   LDLDIRECT 78 07/10/2013 1441    Last Bmet  Potassium  Date Value Ref Range Status  07/28/2016 3.7 3.5 - 5.3 mmol/L Final   Sodium  Date Value Ref Range Status  07/28/2016 140 135 - 146 mmol/L Final   Creat  Date Value Ref Range Status  07/28/2016 0.75 0.50 - 0.99 mg/dL Final    Comment:      For patients > or = 61 years of age: The upper reference limit for Creatinine is approximately 13% higher for people identified as African-American.         Last BPs:  BP Readings from Last 3 Encounters:  10/26/16 (!) 148/64  10/09/16 138/80  09/13/16 132/70      Overdue health maintenance ***  Review of Symptoms:  See HPI for ROS.   CC, SH/smoking status, and VS noted.  Objective: There were no vitals taken for this visit. GEN: NAD, alert, cooperative, and pleasant.*** EYE: no conjunctival injection, pupils equally  round and reactive to light ENMT: normal tympanic light reflex, no nasal polyps,no rhinorrhea, no pharyngeal erythema or exudates NECK: full ROM, no thyromegaly RESPIRATORY: clear to auscultation bilaterally with no wheezes, rhonchi or rales, good effort CV: RRR, no m/r/g, no peripheral edema GI: soft, non-tender, non-distended, normoactive bowel sounds, no hepatosplenomegaly SKIN: warm and dry, no rashes or lesions NEURO: II-XII grossly intact, normal gait, peripheral sensation intact PSYCH: AAOx3, appropriate affect  Assessment and plan:  No problem-specific Assessment & Plan notes found for this encounter.   No orders of the defined types were placed in this encounter.   No orders of the defined types were placed in this encounter.    Everrett Coombe, MD,MS,  PGY1 12/05/2016 5:28 PM

## 2016-12-06 ENCOUNTER — Ambulatory Visit: Payer: Self-pay | Admitting: Student in an Organized Health Care Education/Training Program

## 2016-12-15 ENCOUNTER — Ambulatory Visit (INDEPENDENT_AMBULATORY_CARE_PROVIDER_SITE_OTHER): Payer: Medicare Other | Admitting: Student in an Organized Health Care Education/Training Program

## 2016-12-15 ENCOUNTER — Encounter: Payer: Self-pay | Admitting: Student in an Organized Health Care Education/Training Program

## 2016-12-15 DIAGNOSIS — R05 Cough: Secondary | ICD-10-CM | POA: Diagnosis not present

## 2016-12-15 DIAGNOSIS — Z794 Long term (current) use of insulin: Secondary | ICD-10-CM

## 2016-12-15 DIAGNOSIS — R058 Other specified cough: Secondary | ICD-10-CM | POA: Insufficient documentation

## 2016-12-15 DIAGNOSIS — E119 Type 2 diabetes mellitus without complications: Secondary | ICD-10-CM

## 2016-12-15 DIAGNOSIS — IMO0001 Reserved for inherently not codable concepts without codable children: Secondary | ICD-10-CM

## 2016-12-15 MED ORDER — ACETAMINOPHEN-CODEINE #3 300-30 MG PO TABS: 1.0000 | ORAL_TABLET | Freq: Every evening | ORAL | 0 refills | Status: DC | PRN

## 2016-12-15 MED ORDER — ACCU-CHEK SOFTCLIX LANCETS MISC: 0 refills | Status: DC

## 2016-12-15 MED ORDER — POLYETHYLENE GLYCOL 3350 17 GM/SCOOP PO POWD: ORAL | 3 refills | Status: DC

## 2016-12-15 NOTE — Patient Instructions (Signed)
It was a pleasure seeing you today in our clinic. Today we discussed your cough. Here is the treatment plan we have discussed and agreed upon together:  - Your cough is due to your recent sinus infection - this cough can last up to 6 weeks! - I prescribed a medication that you can take at bedtime to help with bedtime cough - If you are not feeling better in the next 3 weeks, or if your cough worsens, please come back and see [name].   Our clinic's number is [phone]. Please call with questions or concerns about what we discussed today.  Be well, Dr. Burr Medico

## 2016-12-15 NOTE — Assessment & Plan Note (Signed)
Likely residual cough due to recent viral illness - no red flags - tylenol III with codeine QHS PRN - suspect with resolve over next 2-4 weeks - patient to return if symptoms worsen or fail to improve

## 2016-12-15 NOTE — Assessment & Plan Note (Signed)
-   nausea/vomiting improved with current dose victoza - patient reports sugars have been controlled - follow up in ~1 month for next A1c

## 2016-12-15 NOTE — Progress Notes (Signed)
   CC: post-viral cough  HPI: Alicia [name] is a 61 y.o. female who presents to [facility] today with cough of two weeks duration.   Cough - patient with one week of sinus headaches/viral symptoms which have resolved, now with two weeks of cough productive of green mucous. 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 Graylin Shiver recommendation at her last pharm visit  Review of Symptoms:  See HPI for ROS.   CC, SH/smoking status, and VS noted.  Objective: BP 140/78   Pulse 78   Temp 97.5 F (36.4 C) (Oral)   Ht 5\' 3"  (1.6 m)   Wt 168 lb (76.2 kg)   SpO2 97%   BMI 29.76 kg/m  GEN: NAD, alert, cooperative, and pleasant. ENMT: normal tympanic light reflex, no nasal polyps,no rhinorrhea, no pharyngeal erythema or exudates NECK: full ROM, no thyromegaly RESPIRATORY: clear to auscultation bilaterally with no wheezes, rhonchi or rales, good effort CV: RRR, no m/r/g, no peripheral edema SKIN: warm and dry, no rashes or lesions  Assessment and plan:  Post-viral cough syndrome Likely residual cough due to recent viral illness - no red flags - tylenol III with codeine QHS PRN - suspect with resolve over next 2-4 weeks - patient to return if symptoms worsen or fail to improve   Type 2 diabetes mellitus (HCC) - nausea/vomiting improved with current dose victoza - patient reports sugars have been controlled - follow up in ~1 month for next A1c    Meds ordered this encounter  Medications  . polyethylene glycol powder (GLYCOLAX/MIRALAX) powder    Sig: TAKE 17 GRAMS MIXED IN LIQUID THREE TIMES A WEEK    Dispense:  527 g    Refill:  3  . ACCU-CHEK SOFTCLIX LANCETS lancets    Sig: TEST 4 TIMES A DAY    Dispense:  200 each    Refill:  0    E11.9  .  acetaminophen-codeine (TYLENOL #3) 300-30 MG tablet    Sig: Take 1 tablet by mouth at bedtime as needed for moderate pain.    Dispense:  45 tablet    Refill:  0     Everrett Coombe, MD,MS,  PGY1 12/15/2016 5:03 PM

## 2016-12-18 ENCOUNTER — Encounter: Payer: Self-pay | Admitting: Pulmonary Disease

## 2016-12-18 ENCOUNTER — Ambulatory Visit (INDEPENDENT_AMBULATORY_CARE_PROVIDER_SITE_OTHER): Payer: Medicare Other | Admitting: Pulmonary Disease

## 2016-12-18 VITALS — BP 118/64 | HR 88 | Ht 64.0 in | Wt 167.4 lb

## 2016-12-18 DIAGNOSIS — G4733 Obstructive sleep apnea (adult) (pediatric): Secondary | ICD-10-CM | POA: Diagnosis not present

## 2016-12-18 NOTE — Progress Notes (Signed)
Current Outpatient Prescriptions on File Prior to Visit  Medication Sig  . ACCU-CHEK SOFTCLIX LANCETS lancets TEST 4 TIMES A DAY  . acetaminophen-codeine (TYLENOL #3) 300-30 MG tablet Take 1 tablet by mouth at bedtime as needed for moderate pain.  Marland Kitchen aspirin (BAYER CHILDRENS ASPIRIN) 81 MG chewable tablet Chew 81 mg by mouth daily.    . carvedilol (COREG) 12.5 MG tablet TAKE 1 TABLET BY MOUTH TWICE A DAY WITH MEALS  . cholecalciferol (VITAMIN D) 1000 UNITS tablet Take 1,000 Units by mouth daily.  . fluticasone (FLONASE) 50 MCG/ACT nasal spray Place 1 spray into both nostrils daily as needed.   . gabapentin (NEURONTIN) 400 MG capsule TAKE 3 CAPSULES BY MOUTH TWICE A DAY  . glucose blood (ACCU-CHEK AVIVA PLUS) test strip CHECK SUGAR 4 TIMES A DAILY  . hydrochlorothiazide (HYDRODIURIL) 12.5 MG tablet Take 1 tablet (12.5 mg total) by mouth daily.  . insulin aspart (NOVOLOG) 100 UNIT/ML injection Inject 3-4 Units into the skin 2 (two) times daily before lunch and supper.  . Insulin Glargine (BASAGLAR KWIKPEN) 100 UNIT/ML SOPN Inject 0.08 mLs (8 Units total) into the skin daily.  . Insulin Pen Needle 31G X 5 MM MISC 1 Container by Does not apply route once as needed.  . liraglutide (VICTOZA) 18 MG/3ML SOPN Inject 0.1-0.2 mLs (0.6-1.2 mg total) into the skin daily. (Patient taking differently: Inject 1.2 mg into the skin daily. )  . losartan (COZAAR) 100 MG tablet Take 1 tablet (100 mg total) by mouth daily.  . meloxicam (MOBIC) 15 MG tablet TAKE 1 TABLET BY MOUTH EVERY DAY  . metFORMIN (GLUCOPHAGE) 1000 MG tablet Take 1 tablet (1,000 mg total) by mouth 2 (two) times daily.  . pantoprazole (PROTONIX) 40 MG tablet TAKE 1 TABLET BY MOUTH EVERY DAY  . PATADAY 0.2 % SOLN PLACE 1 DROP INTO EACH EYE EVERY DAY AS NEEDED  . polyethylene glycol powder (GLYCOLAX/MIRALAX) powder TAKE 17 GRAMS MIXED IN LIQUID THREE TIMES A WEEK  . rosuvastatin (CRESTOR) 5 MG tablet TAKE 1 TABLET BY MOUTH EVERY OTHER DAY  .  traMADol (ULTRAM) 50 MG tablet Take 50-100 mg by mouth every 6 (six) hours as needed.   No current facility-administered medications on file prior to visit.      Chief Complaint  Patient presents with  . Sleep Apnea    Follow-Up, Pt denies having any concerns since last visit. Pt denies any SOB or chest pain but states that she has had a cough x 2 weeks. Productive cough at first with green to yellow mucus. Pt states that she took Tylenol with codeine medicine which has helped her cough.     Sleep tests Sleep study 08/23/16 >> AHI 6.6, SpO2 low 78%.  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  Vital Signs BP 118/64 (BP Location: Right Arm, Cuff Size: Normal)   Pulse 88   Ht 5\' 4"  (1.626 m)   Wt 167 lb 6.4 oz (75.9 kg)   SpO2 96%   BMI 28.73 kg/m   History of Present Illness Alicia [name] is a 61 y.o. female with obstructive sleep apnea  She was never set up for CPAP.  She has lost a few lbs since last visit.  She feels her sleep is better.  She isn't waking up as much at night, and isn't having trouble with daytime sleepiness.  Interview conducted with sign language  interpretor.   Physical Exam  General - pleasant Eyes - pupils reactive ENT - no sinus tenderness, no oral exudate, no LAN, MP 4 Cardiac - regular, no murmur Chest - no wheeze, rales Abd - soft, non tender Ext - no edema Skin - no rashes Neuro - normal strength Psych - normal mood    Assessment/Plan  Obstructive sleep apnea. - improved with weight loss - encouraged her to keep up weight loss efforts - advised her to schedule f/u if her sleep issues get worse   Patient Instructions  Follow up as needed if you sleep gets worse    Chesley Mires, MD [facility] Pager:  [phone] 12/18/2016, 9:46 AM

## 2016-12-18 NOTE — Patient Instructions (Signed)
Follow up as needed if you sleep gets worse

## 2016-12-20 ENCOUNTER — Other Ambulatory Visit: Payer: Self-pay | Admitting: Student in an Organized Health Care Education/Training Program

## 2016-12-20 DIAGNOSIS — IMO0001 Reserved for inherently not codable concepts without codable children: Secondary | ICD-10-CM

## 2016-12-20 DIAGNOSIS — Z794 Long term (current) use of insulin: Principal | ICD-10-CM

## 2016-12-20 DIAGNOSIS — E119 Type 2 diabetes mellitus without complications: Secondary | ICD-10-CM

## 2016-12-20 MED ORDER — ACCU-CHEK AVIVA DEVI: 0 refills | Status: AC

## 2016-12-20 MED ORDER — ACCU-CHEK SOFTCLIX LANCETS MISC
0 refills | Status: DC
Start: 2016-12-20 — End: 2017-07-11

## 2016-12-20 MED ORDER — GLUCOSE BLOOD VI STRP: ORAL_STRIP | 3 refills | Status: DC

## 2016-12-20 NOTE — Telephone Encounter (Signed)
Pt needs a new glucose meter, test strips, and lancets sent to [facility] on [address]. ep

## 2016-12-22 ENCOUNTER — Other Ambulatory Visit: Payer: Self-pay | Admitting: *Deleted

## 2016-12-22 MED ORDER — GLUCOSE BLOOD VI STRP: ORAL_STRIP | 3 refills | Status: DC

## 2017-01-03 ENCOUNTER — Other Ambulatory Visit: Payer: Self-pay | Admitting: Family Medicine

## 2017-01-03 DIAGNOSIS — E119 Type 2 diabetes mellitus without complications: Secondary | ICD-10-CM

## 2017-01-03 DIAGNOSIS — Z794 Long term (current) use of insulin: Principal | ICD-10-CM

## 2017-01-03 DIAGNOSIS — I10 Essential (primary) hypertension: Secondary | ICD-10-CM

## 2017-01-10 ENCOUNTER — Ambulatory Visit
Admission: RE | Admit: 2017-01-10 | Discharge: 2017-01-10 | Disposition: A | Discharge status: Home / Self Care | Payer: Medicare Other | Source: Ambulatory Visit | Attending: Family Medicine | Admitting: Family Medicine

## 2017-01-10 DIAGNOSIS — Z1231 Encounter for screening mammogram for malignant neoplasm of breast: Secondary | ICD-10-CM | POA: Diagnosis not present

## 2017-01-28 ENCOUNTER — Other Ambulatory Visit: Payer: Self-pay | Admitting: Student in an Organized Health Care Education/Training Program

## 2017-01-28 DIAGNOSIS — E7800 Pure hypercholesterolemia, unspecified: Secondary | ICD-10-CM

## 2017-01-29 ENCOUNTER — Other Ambulatory Visit: Payer: Self-pay | Admitting: Student in an Organized Health Care Education/Training Program

## 2017-01-29 DIAGNOSIS — E119 Type 2 diabetes mellitus without complications: Secondary | ICD-10-CM

## 2017-01-29 DIAGNOSIS — Z794 Long term (current) use of insulin: Principal | ICD-10-CM

## 2017-01-30 DIAGNOSIS — H18413 Arcus senilis, bilateral: Secondary | ICD-10-CM | POA: Diagnosis not present

## 2017-01-30 DIAGNOSIS — H2513 Age-related nuclear cataract, bilateral: Secondary | ICD-10-CM | POA: Diagnosis not present

## 2017-01-30 DIAGNOSIS — H25043 Posterior subcapsular polar age-related cataract, bilateral: Secondary | ICD-10-CM | POA: Diagnosis not present

## 2017-01-30 DIAGNOSIS — H25013 Cortical age-related cataract, bilateral: Secondary | ICD-10-CM | POA: Diagnosis not present

## 2017-01-30 DIAGNOSIS — H2511 Age-related nuclear cataract, right eye: Secondary | ICD-10-CM | POA: Diagnosis not present

## 2017-02-28 ENCOUNTER — Other Ambulatory Visit: Payer: Self-pay | Admitting: Student in an Organized Health Care Education/Training Program

## 2017-03-14 ENCOUNTER — Other Ambulatory Visit: Payer: Self-pay | Admitting: Student in an Organized Health Care Education/Training Program

## 2017-03-14 DIAGNOSIS — I10 Essential (primary) hypertension: Secondary | ICD-10-CM

## 2017-03-19 DIAGNOSIS — H2511 Age-related nuclear cataract, right eye: Secondary | ICD-10-CM | POA: Diagnosis not present

## 2017-03-20 DIAGNOSIS — H2512 Age-related nuclear cataract, left eye: Secondary | ICD-10-CM | POA: Diagnosis not present

## 2017-03-25 ENCOUNTER — Other Ambulatory Visit: Payer: Self-pay | Admitting: Student in an Organized Health Care Education/Training Program

## 2017-03-25 DIAGNOSIS — M792 Neuralgia and neuritis, unspecified: Secondary | ICD-10-CM

## 2017-03-28 ENCOUNTER — Ambulatory Visit (INDEPENDENT_AMBULATORY_CARE_PROVIDER_SITE_OTHER): Payer: Medicare Other | Admitting: Student in an Organized Health Care Education/Training Program

## 2017-03-28 ENCOUNTER — Encounter: Payer: Self-pay | Admitting: Student in an Organized Health Care Education/Training Program

## 2017-03-28 VITALS — BP 126/78 | HR 86 | Temp 97.8°F | Ht 64.0 in | Wt 166.2 lb

## 2017-03-28 DIAGNOSIS — E119 Type 2 diabetes mellitus without complications: Secondary | ICD-10-CM | POA: Diagnosis not present

## 2017-03-28 DIAGNOSIS — I10 Essential (primary) hypertension: Secondary | ICD-10-CM | POA: Diagnosis not present

## 2017-03-28 DIAGNOSIS — Z794 Long term (current) use of insulin: Secondary | ICD-10-CM

## 2017-03-28 DIAGNOSIS — M545 Low back pain, unspecified: Secondary | ICD-10-CM | POA: Insufficient documentation

## 2017-03-28 LAB — POCT GLYCOSYLATED HEMOGLOBIN (HGB A1C): Hemoglobin A1C: 8.1

## 2017-03-28 MED ORDER — HYDROCHLOROTHIAZIDE 12.5 MG PO TABS: 12.5000 mg | ORAL_TABLET | Freq: Every day | ORAL | 3 refills | Status: DC

## 2017-03-28 MED ORDER — BACLOFEN 10 MG PO TABS: 10.0000 mg | ORAL_TABLET | Freq: Two times a day (BID) | ORAL | 0 refills | Status: DC | PRN

## 2017-03-28 NOTE — Progress Notes (Signed)
CC: Back pain  HPI: Alicia [name] is a 61 y.o. female with PMH significant for deafness, diabetes, HTN who presents to [facility] today for Diabetes follow up and new back pain.  Recent sleep study - recommend CPAP. Patient states she was told that her sleep apnea is very mild and that was up to her whether or not she wants the CPAP. Discussed risks of OSA with the patient today, she states that she would still prefer not to use the CPAP. Will not order.  DIABETES Disease Monitoring: HgbA1c 8.1 from 8.2 previously Blood Sugar ranges- in general states sugars are controlled, however does endorse having one week where her sugars were elevated 300-400, she noticed there was a crack in the medication and the vial was empty. Once she replaced this with a new vial, her sugars improved and have been around 120 Polyuria/phagia/dipsia- denies       Visual problems- denies, follows with ophthalmology Medications: Glargine 8u QHS, novolog sliding scale with each meal if >150 SS starts, metformin 1000 mg BID, victoza 1.8 every AM Compliance- 100% reported  Hypoglycemic symptoms- endorses one episode of hypoglycemia to 40. At first she states that she had eaten and was in a rush. However on further questioning she states that she ate all day and took her insulin today but did not have a nighttime snack. She woke from sleep with hypoglycemia.   BACK PAIN Location: Lower Back Quality: throbbing when sitting back or leaning towards it, feels it spreads up and down Onset: Saturday  Worse with: Leaning towards the site of pain on the right Better with: rest  Radiation: None Trauma: None  Red Flags Fecal/urinary incontinence: no  Numbness/Weakness: no  Fever/chills/sweats: no  Night pain: no  Unexplained weight loss: no   h/o cancer/immunosuppression: no  IV drug use: no  PMH of osteoporosis or chronic steroid use: no   .  Monitoring Labs and Parameters Last A1C:  Lab Results  Component Value  Date   HGBA1C 8.1 03/28/2017   Review of Symptoms:  See HPI for ROS.   CC, SH/smoking status, and VS noted.  Objective: BP 126/78   Pulse 86   Temp 97.8 F (36.6 C) (Oral)   Ht 5\' 4"  (1.626 m)   Wt 166 lb 3.2 oz (75.4 kg)   SpO2 95%   BMI 28.53 kg/m  GEN: NAD, alert, cooperative, and pleasant. RESPIRATORY: clear to auscultation bilaterally with no wheezes, rhonchi or rales, good effort CV: RRR, no m/r/g, no peripheral edema  Back Exam:  Inspection: Unremarkable  Palpable tenderness: None. Range of Motion:  Flexion 45 deg; Extension 45 deg; Side Bending to 45 deg bilaterally; Rotation to 45 deg bilaterally  Leg strength: Quad: 5/5 Hamstring: 5/5 Hip flexor: 5/5 Hip abductors: 5/5  Strength at foot: Plantar-flexion: 5/5 Dorsi-flexion: 5/5 Eversion: 5/5 Inversion: 5/5  Sensory change: Gross sensation intact to all lumbar and sacral dermatomes.  Reflexes: 2+ at both patellar tendons Gait unremarkable.  Assessment and plan:  Low back pain without sciatica By history and exam, pain is most consistent with musculoskeletal etiology. There were no red flags. - Sent baclofen and recommended using ibuprofen for pain.  - Recommended that she stay active.  - I let her know that back pain can last for several weeks  - return precautions given   Type 2 diabetes mellitus ([place]) Her hypoglycemic episode is concerning, however because it was one time and we have a good reason for it and inclined to  leave her diabetes regimen as is. Patient is clearly very labile diabetic. - Discussed with Dr. Valentina Lucks - Patient to schedule an appointment with Dr. Valentina Lucks for ambulatory glucose monitoring - Asked if patient has any further lows that she calls back our office right away   Orders Placed This Encounter  Procedures  . HgB A1c  . HgB A1c    Meds ordered this encounter  Medications  . hydrochlorothiazide (HYDRODIURIL) 12.5 MG tablet    Sig: Take 1 tablet (12.5 mg total) by mouth daily.     Dispense:  30 tablet    Refill:  3  . baclofen (LIORESAL) 10 MG tablet    Sig: Take 1 tablet (10 mg total) by mouth 2 (two) times daily as needed for muscle spasms.    Dispense:  30 each    Refill:  0    Everrett Coombe, MD,MS,  PGY2 03/28/2017 3:37 PM

## 2017-03-28 NOTE — Assessment & Plan Note (Signed)
Her hypoglycemic episode is concerning, however because it was one time and we have a good reason for it and inclined to leave her diabetes regimen as is. Patient is clearly very labile diabetic. - Discussed with Dr. Valentina Lucks - Patient to schedule an appointment with Dr. Valentina Lucks for ambulatory glucose monitoring - Asked if patient has any further lows that she calls back our office right away

## 2017-03-28 NOTE — Patient Instructions (Signed)
It was a pleasure seeing you today in our clinic. Today we discussed your diabetes. Here is the treatment plan we have discussed and agreed upon together:  - Please schedule a follow up visit with Dr. Valentina Lucks as you leave today. - If you have any more low glucose levels (<65) Please call and tell me! - I prescribed an anti-inflammatory for your back. Please do not take it with ibuprofen or aleve because it will be bad for your kidneys.  Our clinic's number is [phone]. Please call with questions or concerns about what we discussed today.  Be well, Dr. Burr Medico

## 2017-03-28 NOTE — Assessment & Plan Note (Addendum)
By history and exam, pain is most consistent with musculoskeletal etiology. There were no red flags. - Sent baclofen and recommended using ibuprofen for pain.  - Recommended that she stay active.  - I let her know that back pain can last for several weeks  - return precautions given

## 2017-04-04 DIAGNOSIS — H2512 Age-related nuclear cataract, left eye: Secondary | ICD-10-CM | POA: Diagnosis not present

## 2017-04-04 DIAGNOSIS — E113593 Type 2 diabetes mellitus with proliferative diabetic retinopathy without macular edema, bilateral: Secondary | ICD-10-CM | POA: Diagnosis not present

## 2017-04-04 DIAGNOSIS — H35371 Puckering of macula, right eye: Secondary | ICD-10-CM | POA: Diagnosis not present

## 2017-04-06 ENCOUNTER — Encounter: Payer: Self-pay | Admitting: Pharmacist

## 2017-04-06 ENCOUNTER — Ambulatory Visit (INDEPENDENT_AMBULATORY_CARE_PROVIDER_SITE_OTHER): Payer: Medicare Other | Admitting: Pharmacist

## 2017-04-06 DIAGNOSIS — E119 Type 2 diabetes mellitus without complications: Secondary | ICD-10-CM

## 2017-04-06 DIAGNOSIS — Z794 Long term (current) use of insulin: Secondary | ICD-10-CM

## 2017-04-06 MED ORDER — INSULIN ASPART 100 UNIT/ML ~~LOC~~ SOLN: 1.0000 [IU] | Freq: Three times a day (TID) | SUBCUTANEOUS | 1 refills | Status: DC

## 2017-04-06 NOTE — Assessment & Plan Note (Signed)
Diabetes longstanding moderately controlled with an A1c of 8.1. Patient denies hypoglycemic events and is able to verbalize appropriate hypoglycemia management plan. Patient reports adherence with medications. Control is suboptimal due to nausea from Victoza, possible diabetic gastroparesis, and insulin sensitivity.  Continued basal insulin Basaglar (insulin glargine) at 8 units daily. Continued rapid insulin Novolog (insulin aspart) at 1-4 units with breakfast, lunch, and dinner with any sugar readings now greater than 120 mg/dL rather than greater than 150 mg/dL.  Continued Victoza (liraglutide) 1.2 mg daily minus 3 clicks (0.6mg  plus 7 clicks).

## 2017-04-06 NOTE — Progress Notes (Signed)
    S:    Chief Complaint  Patient presents with  . Medication Management    Type 2 Diabetes    Patient arrives in good spirits with 2 interpreters.  Presents for diabetes evaluation, education, and management follow-up. Patient was last seen by Primary Care Provider on 03/28/17 with Dr. Burr Medico. Most recent A1c 03/28/17 resulted at 8.1 down from 8.2.   At her last visit with Dr. Valentina Lucks on 10/26/16, Victoza dose was slightly decreased to 3 clicks down from 1.2 mg daily due to nausea intolerance. No insulin changes were made at that time.   Patient reports adherence with medications.  Current diabetes medications include: Metformin 1000 mg BID, Novolog 1-4 units with breakfast, lunch, and dinner, Basaglar 8 units daily, Victoza 1.2 mg daily minus 3 clicks. Current hypertension medications include: Coreg 12.5 mg BID, HCTZ 12.5 mg daily, losartan 100 mg daily   Patient denies hypoglycemic events. She also states she is tolerating the Victoza much better at the reduced dose and her nausea has subsided.   Patient reported dietary habits: Eats 3 meals/day Breakfast: Special K cereal with skim milk Drinks: water  Patient reported exercise habits: Takes care of 8 grand-kids. Works at [employer].    Patient denies nocturia.  Patient denies neuropathy. Patient denies visual changes. Patient reports self foot exams.   O:  Physical Exam ROS  Lab Results  Component Value Date   HGBA1C 8.1 03/28/2017   There were no vitals filed for this visit.  Checking 2 times per day, mostly taking Novolog 2 times per day, because the other times she is below <150 mg/dL.  A/P: Diabetes longstanding moderately controlled with an A1c of 8.1. Patient denies hypoglycemic events and is able to verbalize appropriate hypoglycemia management plan. Patient reports adherence with medications. Control is suboptimal due to nausea from Victoza, possible diabetic gastroparesis, and insulin  sensitivity.  Continued basal insulin Basaglar (insulin glargine) at 8 units daily. Continued rapid insulin Novolog (insulin aspart) at 1-4 units with breakfast, lunch, and dinner with any sugar readings now greater than 120 mg/dL rather than greater than 150 mg/dL.  Continued Victoza (liraglutide) 1.2 mg daily minus 3 clicks (0.6mg  plus 7 clicks).    Next A1C anticipated December 2018.    Written patient instructions provided.  Total time in face to face counseling 45 minutes.   Patient seen with Ulanda Edison, PharmD PGY1 Pharmacy Resident

## 2017-04-06 NOTE — Patient Instructions (Addendum)
Thanks for coming into clinic today!  Novolog 1-4 units with breakfast, lunch, and dinner. With any sugar readings greater than 120.   Continue Basaglar 8 units daily.  Continue Victoza 1.2 mg daily MINUS THREE CLICKS.

## 2017-04-09 DIAGNOSIS — H2512 Age-related nuclear cataract, left eye: Secondary | ICD-10-CM | POA: Diagnosis not present

## 2017-04-09 NOTE — Progress Notes (Signed)
Patient ID: Alicia [name], female   DOB: 01-09-1956, 61 y.o.   MRN: [id number] Reviewed: Agree with Dr. Graylin Shiver documentation and management.

## 2017-04-24 ENCOUNTER — Other Ambulatory Visit: Payer: Self-pay | Admitting: Student in an Organized Health Care Education/Training Program

## 2017-04-24 DIAGNOSIS — Z794 Long term (current) use of insulin: Secondary | ICD-10-CM

## 2017-04-24 DIAGNOSIS — M545 Low back pain, unspecified: Secondary | ICD-10-CM

## 2017-04-24 DIAGNOSIS — E119 Type 2 diabetes mellitus without complications: Secondary | ICD-10-CM

## 2017-05-02 ENCOUNTER — Ambulatory Visit (INDEPENDENT_AMBULATORY_CARE_PROVIDER_SITE_OTHER): Payer: Medicare Other | Admitting: *Deleted

## 2017-05-02 DIAGNOSIS — Z23 Encounter for immunization: Secondary | ICD-10-CM | POA: Diagnosis present

## 2017-05-10 ENCOUNTER — Ambulatory Visit (INDEPENDENT_AMBULATORY_CARE_PROVIDER_SITE_OTHER): Payer: Medicare Other | Admitting: Pulmonary Disease

## 2017-05-10 ENCOUNTER — Encounter: Payer: Self-pay | Admitting: Pulmonary Disease

## 2017-05-10 VITALS — BP 140/90 | HR 81 | Ht 64.0 in | Wt 164.4 lb

## 2017-05-10 DIAGNOSIS — G4733 Obstructive sleep apnea (adult) (pediatric): Secondary | ICD-10-CM

## 2017-05-10 NOTE — Patient Instructions (Signed)
Will have you discuss with patient care coordinators about arranging for a home care company to set up a CPAP machine  Follow up in 2 months with Dr. Halford Chessman or Nurse Practitioner

## 2017-05-10 NOTE — Progress Notes (Signed)
Current Outpatient Medications on File Prior to Visit  Medication Sig  . ACCU-CHEK SOFTCLIX LANCETS lancets TEST 4 TIMES A DAY  . acetaminophen-codeine (TYLENOL #3) 300-30 MG tablet Take 1 tablet by mouth at bedtime as needed for moderate pain.  Marland Kitchen aspirin (BAYER CHILDRENS ASPIRIN) 81 MG chewable tablet Chew 81 mg by mouth daily.    . baclofen (LIORESAL) 10 MG tablet Take 1 tablet (10 mg total) by mouth 2 (two) times daily as needed for muscle spasms.  . Blood Glucose Monitoring Suppl (ACCU-CHEK AVIVA) device Use as instructed  . carvedilol (COREG) 12.5 MG tablet TAKE 1 TABLET BY MOUTH TWICE A DAY WITH FOOD  . cholecalciferol (VITAMIN D) 1000 UNITS tablet Take 1,000 Units by mouth daily.  . cyanocobalamin 500 MCG tablet Take 500 mcg by mouth daily.  . fluticasone (FLONASE) 50 MCG/ACT nasal spray Place 1 spray into both nostrils daily as needed.   . gabapentin (NEURONTIN) 400 MG capsule TAKE 3 CAPSULES BY MOUTH TWICE A DAY  . glucose blood (ACCU-CHEK AVIVA PLUS) test strip CHECK SUGAR 4 TIMES A DAILY. ICD-10 code: E11.8.  Marland Kitchen hydrochlorothiazide (HYDRODIURIL) 12.5 MG tablet Take 1 tablet (12.5 mg total) by mouth daily.  . insulin aspart (NOVOLOG) 100 UNIT/ML injection Inject 1-4 Units into the skin 3 (three) times daily with meals.  . Insulin Glargine (BASAGLAR KWIKPEN) 100 UNIT/ML SOPN Inject 0.08 mLs (8 Units total) into the skin daily.  . Insulin Pen Needle 31G X 5 MM MISC 1 Container by Does not apply route once as needed.  Marland Kitchen losartan (COZAAR) 100 MG tablet Take 1 tablet (100 mg total) by mouth daily.  . meloxicam (MOBIC) 15 MG tablet Take 15 mg by mouth daily.  . metFORMIN (GLUCOPHAGE) 1000 MG tablet Take 1 tablet (1,000 mg total) by mouth 2 (two) times daily.  . pantoprazole (PROTONIX) 40 MG tablet TAKE 1 TABLET BY MOUTH EVERY DAY  . PATADAY 0.2 % SOLN PLACE 1 DROP INTO EACH EYE EVERY DAY AS NEEDED  . polyethylene glycol powder (GLYCOLAX/MIRALAX) powder TAKE 17 GRAMS MIXED IN LIQUID THREE  TIMES A WEEK  . rosuvastatin (CRESTOR) 5 MG tablet TAKE 1 TABLET BY MOUTH EVERY OTHER DAY  . traMADol (ULTRAM) 50 MG tablet Take 50-100 mg by mouth every 6 (six) hours as needed.  Marland Kitchen VICTOZA 18 MG/3ML SOPN INJECT 0.1-0.2 ML (0.6-1.2 MG TOTAL) INTO THE SKIN EVERY DAY   No current facility-administered medications on file prior to visit.      Chief Complaint  Patient presents with  . Follow-up    Pt is not sleeping well at night, her son states when pt is on her back she stops breathing a little bit, and daytime sleepiness has worsened. Pt is needing CPAP machine and has only rec'd the mask and hose nothing else. Pt had BP 158/88 yesterday head bad headache with some heart pulp. when she stood up had some SOB, and has slight headache today      Sleep tests Sleep study 08/23/16 >> AHI 6.6, SpO2 low 78%.  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  Vital Signs BP 140/90 (BP Location: Left Arm, Cuff Size: Normal)   Pulse 81   Ht 5\' 4"  (1.626 m)   Wt 164 lb 6.4 oz (74.6 kg)   SpO2 94%   BMI 28.22 kg/m   History of Present Illness Alicia [name] is a 61  y.o. female with obstructive sleep apnea  Since her last visit her sleep has gotten worse.  Her son says she is snoring more, and will stop breathing.  This is especially common when she is sleeping on her back.  She has noticed feeling more tired during the day, and getting more frequent headaches.  Her PCP has been worried about her blood pressure and diabetes control.  Interview conducted with sign language interpretor.   Physical Exam  General - pleasant Eyes - pupils reactive ENT - no sinus tenderness, no oral exudate, no LAN Cardiac - regular, no murmur Chest - no wheeze, rales Abd - soft, non tender Ext - no edema Skin - no rashes Neuro - normal strength Psych - normal  mood   Assessment/Plan  Obstructive sleep apnea. - her symptoms have gotten worse - she has history of hypertension - will arrange for auto CPAP set up   Patient Instructions  Will have you discuss with patient care coordinators about arranging for a home care company to set up a CPAP machine  Follow up in 2 months with Dr. Halford Chessman or Nurse Practitioner    Chesley Mires, MD [place] Pager:  [phone] 05/10/2017, 11:22 AM

## 2017-05-17 ENCOUNTER — Other Ambulatory Visit: Payer: Self-pay | Admitting: Student in an Organized Health Care Education/Training Program

## 2017-05-21 ENCOUNTER — Other Ambulatory Visit: Payer: Self-pay | Admitting: Student in an Organized Health Care Education/Training Program

## 2017-05-21 DIAGNOSIS — E119 Type 2 diabetes mellitus without complications: Secondary | ICD-10-CM

## 2017-05-21 DIAGNOSIS — Z794 Long term (current) use of insulin: Principal | ICD-10-CM

## 2017-06-12 ENCOUNTER — Other Ambulatory Visit: Payer: Self-pay | Admitting: Student in an Organized Health Care Education/Training Program

## 2017-06-15 ENCOUNTER — Ambulatory Visit (INDEPENDENT_AMBULATORY_CARE_PROVIDER_SITE_OTHER): Payer: Medicare Other | Admitting: Internal Medicine

## 2017-06-15 ENCOUNTER — Encounter: Payer: Self-pay | Admitting: Internal Medicine

## 2017-06-15 ENCOUNTER — Other Ambulatory Visit: Payer: Self-pay

## 2017-06-15 DIAGNOSIS — J069 Acute upper respiratory infection, unspecified: Secondary | ICD-10-CM | POA: Diagnosis present

## 2017-06-15 MED ORDER — GUAIFENESIN 200 MG PO TABS
200.0000 mg | ORAL_TABLET | Freq: Three times a day (TID) | ORAL | 0 refills | Status: DC | PRN
Start: 2017-06-15 — End: 2018-06-06

## 2017-06-15 MED ORDER — BENZONATATE 100 MG PO CAPS: 200.0000 mg | ORAL_CAPSULE | Freq: Three times a day (TID) | ORAL | 0 refills | Status: DC | PRN

## 2017-06-15 NOTE — Progress Notes (Signed)
   Subjective:   Patient: Alicia [name]       Birthdate: 05-Oct-1955       MRN: [id number]      HPI  Alicia [name] is a 61 y.o. female presenting for same day appt for cold symptoms.  Sign language interpreter present for encounter.    Cold symptoms Patient reports symptoms x1 wk. Initially had a headache primarily on the R. Was 8/10 at greatest, now 2-3/10. Also reports coughing and production of green mucous, as well as feeling of lots of sinus pressure. Denies sore throat. For headache, says she took Tylenol, Excedrin, and ibuprofen, which was not very helpful. She has not tried any medication since 6 days ago. Denies vision changes, numbness, weakness, nausea, vomiting, or photosensitivity with HA. Was previously seen by neuro for HA but has not been there in a very long time. For cough, has not tried any medications.   Smoking status reviewed. Patient is never smoker.   Review of Systems See HPI.     Objective:  Physical Exam  Constitutional: She is oriented to person, place, and time and well-developed, well-nourished, and in no distress.  HENT:  Head: Normocephalic and atraumatic.  Nose: Nose normal.  Mouth/Throat: Oropharynx is clear and moist. No oropharyngeal exudate.  Eyes: Conjunctivae and EOM are normal. Pupils are equal, round, and reactive to light. Right eye exhibits no discharge. Left eye exhibits no discharge.  No nystagmus  Neck: Normal range of motion. Neck supple.  Cardiovascular: Normal rate, regular rhythm and normal heart sounds.  No murmur heard. Pulmonary/Chest: Effort normal and breath sounds normal. No respiratory distress. She has no wheezes.  Coughing intermittently throughout encounter  Lymphadenopathy:    She has no cervical adenopathy.  Neurological: She is alert and oriented to person, place, and time.  CN II-XII grossly intact. 5/5 strength upper and lower extremities bilaterally.   Skin: Skin is warm and dry.  Psychiatric: Affect and judgment  normal.      Assessment & Plan:  URI (upper respiratory infection) Most likely etiology of constellation of symptoms. Patient does have history of HA, though current HA is not consistent with migraines and does seem related to congestion and URI. Has significantly improved without medication, and only rates as 2/10 today. No vision changes or reported neuro deficits. No neuro deficits on physical exam. No oropharyngeal erythema or exudates, and lungs CTAB. Afebrile and well-appearing today. Will treat conservatively.  - Tessalon perles and guaifenesin q8 PRN - Tylenol for HA PRN - Discussed return precautions. If HA persists, could consider recommending patient follow up with her neuro +/- obtaining imaging    Adin Hector, MD, MPH PGY-3 [facility] Pager [phone]

## 2017-06-15 NOTE — Patient Instructions (Addendum)
It was nice meeting you today Ms. Mateo!  For your cough, please begin taking one Tessalon perle and one guaifenesin (Mucinex) tablet up to every 8 hours. Be sure to take these together. You can also try Chloraseptic throat lozenges to help with cough and sore throat.   For your headache, I would recommend trying ibuprofen or Tylenol again. For both medications, you can take 2 tablets up to every 6 hours as needed for pain.   If your headache becomes worse, or if you begin to notice vision changes, numbness, or weakness with a headache, please let us know right away or go to the emergency room.   If you have any questions or concerns, please feel free to call the clinic.   Be well,  Dr. Avon Gully

## 2017-06-15 NOTE — Assessment & Plan Note (Signed)
Most likely etiology of constellation of symptoms. Patient does have history of HA, though current HA is not consistent with migraines and does seem related to congestion and URI. Has significantly improved without medication, and only rates as 2/10 today. No vision changes or reported neuro deficits. No neuro deficits on physical exam. No oropharyngeal erythema or exudates, and lungs CTAB. Afebrile and well-appearing today. Will treat conservatively.  - Tessalon perles and guaifenesin q8 PRN - Tylenol for HA PRN - Discussed return precautions. If HA persists, could consider recommending patient follow up with her neuro +/- obtaining imaging

## 2017-06-18 ENCOUNTER — Other Ambulatory Visit: Payer: Self-pay | Admitting: Student in an Organized Health Care Education/Training Program

## 2017-06-18 DIAGNOSIS — I10 Essential (primary) hypertension: Secondary | ICD-10-CM

## 2017-06-20 ENCOUNTER — Ambulatory Visit (INDEPENDENT_AMBULATORY_CARE_PROVIDER_SITE_OTHER): Payer: Medicare Other | Admitting: Student

## 2017-06-20 ENCOUNTER — Encounter: Payer: Self-pay | Admitting: Student

## 2017-06-20 ENCOUNTER — Other Ambulatory Visit: Payer: Self-pay

## 2017-06-20 VITALS — BP 122/64 | HR 79 | Temp 97.6°F | Ht 64.0 in | Wt 164.8 lb

## 2017-06-20 DIAGNOSIS — Z794 Long term (current) use of insulin: Secondary | ICD-10-CM | POA: Diagnosis not present

## 2017-06-20 DIAGNOSIS — E7800 Pure hypercholesterolemia, unspecified: Secondary | ICD-10-CM | POA: Diagnosis not present

## 2017-06-20 DIAGNOSIS — I10 Essential (primary) hypertension: Secondary | ICD-10-CM

## 2017-06-20 DIAGNOSIS — R079 Chest pain, unspecified: Secondary | ICD-10-CM | POA: Diagnosis not present

## 2017-06-20 DIAGNOSIS — E119 Type 2 diabetes mellitus without complications: Secondary | ICD-10-CM | POA: Diagnosis present

## 2017-06-20 LAB — POCT GLYCOSYLATED HEMOGLOBIN (HGB A1C): Hemoglobin A1C: 7.4

## 2017-06-20 NOTE — Patient Instructions (Addendum)
It was great seeing you today! We have addressed the following issues today  Diabetes: A1c 7.4% today.  It was 8.1% previously. Congratulations!  Continue you insulin some metformin.  I also recommend lifestyle change including diet and exercise as below.  Follow-up in 6 months.  Please ask your eye doctor to send as your eye exam results.  We also recommend dental checkup every 6 months.  Chest pain: We send a referral to cardiology (heart doctor).  Someone will get in touch with you about this referral in the next 4 weeks.  Meanwhile, go to emergency department or call 911 if you have severe chest pain, trouble breathing or other symptoms concerning to you.  Annual wellness visit: Please schedule for your annual wellness visit in the next 2 weeks.    If we did any lab work today, and the results require attention, either me or my nurse will get in touch with you. If everything is normal, you will get a letter in mail and a message via . If you don't hear from [name] in two weeks, please give [name] a call. Otherwise, we look forward to seeing you again at your next visit. If you have any questions or concerns before then, please call the clinic at [phone].  Please bring all your medications to every doctors visit  Sign up for My Chart to have easy access to your labs results, and communication with your Primary care physician.    Please check-out at the front desk before leaving the clinic.    Take Care,   Dr. Cyndia Skeeters  Portion Size   Choose healthier foods such as 100% whole grains, vegetables, fruits, beans, nut seeds, olive oil, most vegetable oils, fat-free dietary, wild game and fish.   Avoid sweet tea, other sweetened beverages, soda, fruit juice, cold cereal and milk and trans fat.   Eat at least 3 meals and 1-2 snacks per day.  Aim for no more than 5 hours between eating.  Eat breakfast within one hour of getting up.    Exercise at least 150 minutes per week, including  weight resistance exercises 3 or 4 times per week.   Try to lose at least 7-10% of your current body weight.   Limit your salt (Sodium) intake to less than 2 gm (2000 mg) a day if you have conditions such as  elevated blood pressure, heart failure...    You may also read about DASH and/or Mediterranean diet at the following web site if you have blood pressure or heart condition. IdentityList.se [email or web address].co.za

## 2017-06-20 NOTE — Progress Notes (Signed)
Subjective:    Alicia [name] is a 61 y.o. old female here for follow-up on diabetes. Patient is here with sign language interpretor.   HPI Diabetes: A1c 7.4% today.  Previously 8.1%.  Checks her blood glucose 4 times a day.  Fasting and pre-meal glucose ranges from 70-250.  Fasting blood glucose is 74 this morning. Using Basaglar 8 units at bedtime. SSI Novolog 1-4 units, Victoza 0.1 ml,  metformin 1000 mg twice a day.  Walking and playing with grandchildren everyday. Watching her diet. Getting two serving of vegetables. Occasional diet soda. Unsweatend tea. Occ alcohol.  Denies smoking cigarette  Saw an eye doctor two months ago. She doesn't remember the name of the practice or doctor.  Eye vision better since she had an eye surgery about 2 months ago. Haven't been to a dentist in years because she is worried that Medicare may not cover.   Denies numbness or tingling in her legs.  Chest pain: this has been going on for one month.  Pain was initially over his left chest after walking upstairs about 3 weeks ago . Yesterday she feels the pain in her right chest.  It lasted about three minutes.  It resolved on its own. She also reports feeling some pain in her left axillary area briefly.  Pain is not necessarily exertional.  Not sure about aggravating or alleviating factor.  Chest pain comes unexpectedly. She felt short of breath when she had chest pain three weeks ago. She felt a little bit of nausea when she had chest pain yesterday.  Last chest pain was yesterday.  Mother with heart attack at 50 years of age.  The 10-year ASCVD risk score Mikey Bussing DC Brooke Bonito., et al., 2013) is: 11.2%   Values used to calculate the score:     Age: 79 years     Sex: Female     Is Non-Hispanic African American: Yes     Diabetic: Yes     Tobacco smoker: No     Systolic Blood Pressure: 154 mmHg     Is BP treated: Yes     HDL Cholesterol: 81 mg/dL     Total Cholesterol: 184 mg/dL  PMH/Problem List: has Diabetic  polyneuropathy ([place]); Type 2 diabetes mellitus ([place]); HYPERCHOLESTEROLEMIA; HEARING LOSS NOS OR DEAFNESS; Essential HTN; Asthma; ARTHRITIS, KNEE; Diabetic retinopathy ([place]); URI (upper respiratory infection); Carpal tunnel syndrome; Seizure ([place]); New onset of headaches after age 66; OSA (obstructive sleep apnea); Post-viral cough syndrome; and Low back pain without sciatica on their problem list.   has a past medical history of Anemia, Complete deafness, Deaf, Diabetes mellitus, Diabetic neuropathy ([place]), Ganglion cyst (06/22/2011), Gastroparesis, GERD (gastroesophageal reflux disease), H/O: C-section, Hyperlipidemia, Hypertension, Neuromuscular disorder ([employer]), and S/P appy.  FH:  Family History  Problem Relation Age of Onset  . Hypertension Mother   . Diabetes Father   . Cancer Maternal Aunt   . Cancer Maternal Grandmother     SH Social History   Tobacco Use  . Smoking status: Never Smoker  . Smokeless tobacco: Never Used  Substance Use Topics  . Alcohol use: Yes    Alcohol/week: 0.0 oz    Comment: rarely- only wine, "2-3 times per week"  . Drug use: No    Review of Systems Review of systems negative except for pertinent positives and negatives in history of present illness above.     Objective:     Vitals:   06/20/17 0912  BP: 122/64  Pulse: 79  Temp: 97.6 F (36.4  C)  TempSrc: Oral  SpO2: 98%  Weight: 164 lb 12.8 oz (74.8 kg)  Height: '5\' 4"'$  (1.626 m)   Body mass index is 28.29 kg/m.  Physical Exam  GEN: appears well, no apparent distress. Oropharynx: mmm without erythema or exudation HEM: negative for cervical or periauricular lymphadenopathies CVS: RRR, nl s1 & s2 heard, 1/6 SEM over RUSB and LUSB, no edema RESP: no IWOB, good air movement bilaterally, CTAB GI: BS present & normal, soft, NTND MSK: No tenderness to palpation over her chest is bilaterally, full range of motion in his shoulders SKIN: no apparent skin lesion ENDO: negative thyromegally NEURO:  alert and oiented appropriately, no gross deficits  PSYCH: euthymic mood with congruent affect  Diabetic Foot Exam: Inspection: no skin lesion, ulcer or callus. Toenails trimmed  Vascular: DP & PT pulses 2+ bilaterally Neuro: Intact 9-point monofilament exam bilaterally    Assessment and Plan:  1. Type 2 diabetes mellitus without complication, with long-term current use of insulin ([place]): Well-controlled.  A1c 7.4%.  We will continue Lantus, SSI NovoLog, Victoza and metformin.  She is taking Crestor 5 mg.  Seen her eye doctor 2 months ago.  I asked her to contact her doctor's office to send as an eye exam results.  Discussed about the importance of dental checkup given her diabetes.  Advised her to call dentist office to figure out about her co-pay.  Discussed about exercise and diet and gave her handout as well.  Follow-up in 6 months.  2. Chest pain, unspecified type: A typical in nature but significant risk factors. She is currently chest pain-free.  I believe she would benefit from a stress test.  - Ambulatory referral to Cardiology  3. Essential HTN - TSH - CMP14+EGFR   4. HYPERCHOLESTEROLEMIA - Lipid panel  Return in about 2 weeks (around 07/04/2017) for Annual wellness visit.  Mercy Riding, MD 06/20/17 Pager: [phone]

## 2017-06-21 ENCOUNTER — Telehealth: Payer: Self-pay | Admitting: Student

## 2017-06-21 ENCOUNTER — Other Ambulatory Visit: Payer: Self-pay | Admitting: Student

## 2017-06-21 DIAGNOSIS — E7800 Pure hypercholesterolemia, unspecified: Secondary | ICD-10-CM

## 2017-06-21 LAB — CMP14+EGFR
ALT: 10 IU/L (ref 0–32)
AST: 12 IU/L (ref 0–40)
Albumin/Globulin Ratio: 1.3 (ref 1.2–2.2)
Albumin: 4 g/dL (ref 3.6–4.8)
Alkaline Phosphatase: 99 IU/L (ref 39–117)
BUN/Creatinine Ratio: 14 (ref 12–28)
BUN: 12 mg/dL (ref 8–27)
Bilirubin Total: <0.2 mg/dL (ref 0.0–1.2)
CO2: 26 mmol/L (ref 20–29)
Calcium: 10.6 mg/dL — ABNORMAL HIGH (ref 8.7–10.3)
Chloride: 99 mmol/L (ref 96–106)
Creatinine, Ser: 0.88 mg/dL (ref 0.57–1.00)
GFR calc Af Amer: 82 mL/min/{1.73_m2} (ref >59)
GFR calc non Af Amer: 71 mL/min/{1.73_m2} (ref >59)
Globulin, Total: 3 g/dL (ref 1.5–4.5)
Glucose: 189 mg/dL — ABNORMAL HIGH (ref 65–99)
Potassium: 4.1 mmol/L (ref 3.5–5.2)
Sodium: 140 mmol/L (ref 134–144)
Total Protein: 7 g/dL (ref 6.0–8.5)

## 2017-06-21 LAB — LIPID PANEL
Chol/HDL Ratio: 2.2 ratio (ref 0.0–4.4)
Cholesterol, Total: 188 mg/dL (ref 100–199)
HDL: 84 mg/dL (ref >39)
LDL Calculated: 93 mg/dL (ref 0–99)
Triglycerides: 57 mg/dL (ref 0–149)
VLDL Cholesterol Cal: 11 mg/dL (ref 5–40)

## 2017-06-21 LAB — TSH: TSH: 1.88 u[IU]/mL (ref 0.450–4.500)

## 2017-06-21 MED ORDER — ROSUVASTATIN CALCIUM 5 MG PO TABS: 5.0000 mg | ORAL_TABLET | Freq: Every day | ORAL | 0 refills | Status: DC

## 2017-06-21 NOTE — Telephone Encounter (Signed)
Called and discussed patient's lab results using a sign interpreter.  BMP within normal limits except for mild elevated calcium to 10.6.  I told her this is likely due to HCTZ and vitamin D she is taking.  She denies taking Tums.  I told her this is not at a level where we have to worry or make any medication changes.  She should continue taking HCTZ and vitamin D.  I have added PTH to her previously collected blood to rule out primary hypothyroidism.  I also discussed about high cholesterol.  Her ASCVD risk score is 11.2%.  She is taking Crestor 5 mg every other day.  She had some myopathies with atorvastatin in the past.  She denies any problem with Crestor 5 mg every other day.  I recommended trying Crestor 5 mg every day.  If no issues with this, I suggested talking to her PCP about increasing it to 10 mg daily.  She voices understanding and agrees

## 2017-06-21 NOTE — Progress Notes (Signed)
Elevated Ca to 10.6. Added PTH

## 2017-06-27 ENCOUNTER — Other Ambulatory Visit: Payer: Self-pay | Admitting: Family Medicine

## 2017-07-06 ENCOUNTER — Telehealth: Payer: Self-pay | Admitting: *Deleted

## 2017-07-06 ENCOUNTER — Other Ambulatory Visit: Payer: Self-pay | Admitting: Student in an Organized Health Care Education/Training Program

## 2017-07-06 NOTE — Telephone Encounter (Signed)
Patient left message on nurse line requesting med refill but did not leave name of med or pharmacy. Returned patient's call via [place]. No answer. Interpreter left sign message asking for name of med, pharmacy and location. Hubbard Hartshorn, RN, BSN

## 2017-07-06 NOTE — Progress Notes (Signed)
Received fax refill request for test strips. Refilled electronically.  Everrett Coombe, MD PGY-2 [facility]

## 2017-07-06 NOTE — Telephone Encounter (Signed)
Pt daughter called and wanted to let lauren know to send test strips to [facility] on [address]. Nandan Willems Kennon Holter, CMA

## 2017-07-07 MED ORDER — GLUCOSE BLOOD VI STRP: ORAL_STRIP | 3 refills | Status: DC

## 2017-07-07 NOTE — Telephone Encounter (Signed)
Sent test strips to pharmacy

## 2017-07-09 DIAGNOSIS — E113592 Type 2 diabetes mellitus with proliferative diabetic retinopathy without macular edema, left eye: Secondary | ICD-10-CM | POA: Diagnosis not present

## 2017-07-09 DIAGNOSIS — E113511 Type 2 diabetes mellitus with proliferative diabetic retinopathy with macular edema, right eye: Secondary | ICD-10-CM | POA: Diagnosis not present

## 2017-07-09 DIAGNOSIS — H35371 Puckering of macula, right eye: Secondary | ICD-10-CM | POA: Diagnosis not present

## 2017-07-09 DIAGNOSIS — H04123 Dry eye syndrome of bilateral lacrimal glands: Secondary | ICD-10-CM | POA: Diagnosis not present

## 2017-07-09 NOTE — Telephone Encounter (Signed)
[facility] walked in stating [facility] did not [facility] for test strips. Spoke with patient via [place], [place] [id number]. [facility] in patient's presence. They state strips are not covered by Medicare Part D and in order for Part B to cover there must be a dx code on the hard copy. They are unable to take Dx code verbally. Please resend test strips with Dx code to [facility] [address]. Hubbard Hartshorn, RN, BSN

## 2017-07-10 ENCOUNTER — Ambulatory Visit: Payer: Self-pay | Admitting: Pulmonary Disease

## 2017-07-10 ENCOUNTER — Other Ambulatory Visit: Payer: Self-pay | Admitting: Student in an Organized Health Care Education/Training Program

## 2017-07-10 DIAGNOSIS — E118 Type 2 diabetes mellitus with unspecified complications: Secondary | ICD-10-CM

## 2017-07-10 DIAGNOSIS — Z794 Long term (current) use of insulin: Secondary | ICD-10-CM

## 2017-07-10 MED ORDER — GLUCOSE BLOOD VI STRP: ORAL_STRIP | 3 refills | Status: DC

## 2017-07-10 NOTE — Telephone Encounter (Signed)
Sent with dx code to [facility] [address]

## 2017-07-11 ENCOUNTER — Other Ambulatory Visit: Payer: Self-pay | Admitting: *Deleted

## 2017-07-11 DIAGNOSIS — Z794 Long term (current) use of insulin: Principal | ICD-10-CM

## 2017-07-11 DIAGNOSIS — IMO0001 Reserved for inherently not codable concepts without codable children: Secondary | ICD-10-CM

## 2017-07-11 DIAGNOSIS — E119 Type 2 diabetes mellitus without complications: Principal | ICD-10-CM

## 2017-07-11 MED ORDER — ACCU-CHEK SOFTCLIX LANCETS MISC: 0 refills | Status: DC

## 2017-07-24 ENCOUNTER — Other Ambulatory Visit: Payer: Self-pay | Admitting: Student in an Organized Health Care Education/Training Program

## 2017-07-24 DIAGNOSIS — M792 Neuralgia and neuritis, unspecified: Secondary | ICD-10-CM

## 2017-08-09 ENCOUNTER — Other Ambulatory Visit: Payer: Self-pay | Admitting: Student in an Organized Health Care Education/Training Program

## 2017-08-09 ENCOUNTER — Other Ambulatory Visit: Payer: Self-pay | Admitting: Family Medicine

## 2017-08-09 DIAGNOSIS — Z794 Long term (current) use of insulin: Principal | ICD-10-CM

## 2017-08-09 DIAGNOSIS — E118 Type 2 diabetes mellitus with unspecified complications: Secondary | ICD-10-CM

## 2017-08-09 DIAGNOSIS — E119 Type 2 diabetes mellitus without complications: Secondary | ICD-10-CM

## 2017-08-09 MED ORDER — GLUCOSE BLOOD VI STRP: ORAL_STRIP | 3 refills | Status: DC

## 2017-08-09 NOTE — Telephone Encounter (Signed)
Needs refills on test strips .  Only has 4 left.  Alicia [name]

## 2017-08-10 ENCOUNTER — Other Ambulatory Visit: Payer: Self-pay

## 2017-08-10 DIAGNOSIS — Z794 Long term (current) use of insulin: Secondary | ICD-10-CM

## 2017-08-10 DIAGNOSIS — E118 Type 2 diabetes mellitus with unspecified complications: Secondary | ICD-10-CM

## 2017-08-10 NOTE — Telephone Encounter (Signed)
"  Pharmacy Comments: Alternative requested for Accu-Check [facility]iva Alternative requested per medicare guidelines, TID testing is standard. Please resend Rx with Sig of TID testing so that we may bill Medicare."

## 2017-08-13 NOTE — Telephone Encounter (Signed)
[facility] says they have not received the pre authorization for her test strips.  She has been without them for almost a week and is getting frantic that she cannot test.  Please advise

## 2017-08-14 DIAGNOSIS — H26491 Other secondary cataract, right eye: Secondary | ICD-10-CM | POA: Diagnosis not present

## 2017-08-14 DIAGNOSIS — H20023 Recurrent acute iridocyclitis, bilateral: Secondary | ICD-10-CM | POA: Diagnosis not present

## 2017-08-14 DIAGNOSIS — E113593 Type 2 diabetes mellitus with proliferative diabetic retinopathy without macular edema, bilateral: Secondary | ICD-10-CM | POA: Diagnosis not present

## 2017-08-14 DIAGNOSIS — Z961 Presence of intraocular lens: Secondary | ICD-10-CM | POA: Diagnosis not present

## 2017-08-14 MED ORDER — GLUCOSE BLOOD VI STRP: ORAL_STRIP | 3 refills | Status: DC

## 2017-08-15 ENCOUNTER — Other Ambulatory Visit: Payer: Self-pay | Admitting: Student in an Organized Health Care Education/Training Program

## 2017-08-15 DIAGNOSIS — E118 Type 2 diabetes mellitus with unspecified complications: Secondary | ICD-10-CM

## 2017-08-15 DIAGNOSIS — Z794 Long term (current) use of insulin: Secondary | ICD-10-CM

## 2017-08-15 MED ORDER — GLUCOSE BLOOD VI STRP: ORAL_STRIP | 3 refills | Status: DC

## 2017-08-15 NOTE — Telephone Encounter (Signed)
Accu check aviva plus strips sent to [place] with a code.

## 2017-08-15 NOTE — Telephone Encounter (Signed)
[facility] the dr sent in the wrong Rx for her test strips.  She has been without strips for a week and cannot check her sugars.  She is very concerned about not being able to check.  She has accu check aviva plus.  A code is also needed.    [facility] [address]   She needs them ASAP

## 2017-08-16 ENCOUNTER — Ambulatory Visit (INDEPENDENT_AMBULATORY_CARE_PROVIDER_SITE_OTHER): Payer: Medicare Other | Admitting: Internal Medicine

## 2017-08-16 ENCOUNTER — Other Ambulatory Visit: Payer: Self-pay

## 2017-08-16 ENCOUNTER — Other Ambulatory Visit: Payer: Self-pay | Admitting: Family Medicine

## 2017-08-16 ENCOUNTER — Encounter: Payer: Self-pay | Admitting: Internal Medicine

## 2017-08-16 VITALS — BP 148/88 | HR 83 | Temp 97.9°F | Ht 64.0 in | Wt 166.4 lb

## 2017-08-16 DIAGNOSIS — E118 Type 2 diabetes mellitus with unspecified complications: Secondary | ICD-10-CM

## 2017-08-16 DIAGNOSIS — Z794 Long term (current) use of insulin: Secondary | ICD-10-CM

## 2017-08-16 DIAGNOSIS — E119 Type 2 diabetes mellitus without complications: Secondary | ICD-10-CM | POA: Diagnosis present

## 2017-08-16 LAB — GLUCOSE, POCT (MANUAL RESULT ENTRY): POC Glucose: 97 mg/dl (ref 70–99)

## 2017-08-16 MED ORDER — GLUCOSE BLOOD VI STRP: ORAL_STRIP | 12 refills | Status: DC

## 2017-08-16 NOTE — Progress Notes (Signed)
   Subjective:    Alicia [name] - 62 y.o. female MRN [id number]  Date of birth: 10/04/1955  HPI  Alicia [name] is here for refills of diabetic test strips. She is very upset that she has not been able to check CBGs at home for the past few weeks. [facility]. She endorses occasional symptoms of hypoglycemia (mostly feeling shaky) and it has been distressing that she has not been able to measure her blood sugar. Denies vomiting, polyuria, passing out.   -  reports that  has never smoked. she has never used smokeless tobacco. - Review of Systems: Per HPI. - Past Medical History: Patient Active Problem List   Diagnosis Date Noted  . Low back pain without sciatica 03/28/2017  . Post-viral cough syndrome 12/15/2016  . OSA (obstructive sleep apnea) 08/31/2016  . New onset of headaches after age 45 12/08/2014  . Seizure ([place]) 06/08/2014  . Carpal tunnel syndrome 01/05/2014  . URI (upper respiratory infection) 07/07/2013  . Diabetic retinopathy ([place]) 07/21/2011  . Asthma 04/04/2010  . ARTHRITIS, KNEE 09/17/2007  . Diabetic polyneuropathy ([place]) 08/30/2006  . Type 2 diabetes mellitus ([place]) 08/30/2006  . HYPERCHOLESTEROLEMIA 08/30/2006  . HEARING LOSS NOS OR DEAFNESS 08/30/2006  . Essential HTN 08/30/2006   - Medications: reviewed and updated   Objective:   Physical Exam BP (!) 148/88   Pulse 83   Temp 97.9 F (36.6 C) (Oral)   Ht 5\' 4"  (1.626 m)   Wt 166 lb 6.4 oz (75.5 kg)   SpO2 96%   BMI 28.56 kg/m  Gen: NAD, alert, cooperative with exam, well-appearing Psych: good insight, alert and oriented     Assessment & Plan:   Type 2 diabetes mellitus ([place]) CBG 97 today. Patient reports she did eat breakfast and took her insulin. Had attending send in test strips for patient to ensure that pharmacy would fill them through Medicare. Have instructed patient to start testing glucose at home. If below 80 needs to call for adjustment of  insulin. Likely needs lower dose of insulin if having persistent episodes of hypoglycemia. Patient able to verbalize hypoglycemic symptoms and appropriate management plan.    Phill Myron, D.O. 08/17/2017, 9:41 AM PGY-3, [place]

## 2017-08-16 NOTE — Patient Instructions (Signed)
If you have blood sugars less than 80 please call your PCP Dr. Burr Medico for adjustment of your insulin.    Hypoglycemia Hypoglycemia is when the sugar (glucose) level in the blood is too low. Symptoms of low blood sugar may include:  Feeling: ? Hungry. ? Worried or nervous (anxious). ? Sweaty and clammy. ? Confused. ? Dizzy. ? Sleepy. ? Sick to your stomach (nauseous).  Having: ? A fast heartbeat. ? A headache. ? A change in your vision. ? Jerky movements that you cannot control (seizure). ? Nightmares. ? Tingling or no feeling (numbness) around the mouth, lips, or tongue.  Having trouble with: ? Talking. ? Paying attention (concentrating). ? Moving (coordination). ? Sleeping.  Shaking.  Passing out (fainting).  Getting upset easily (irritability).  Low blood sugar can happen to people who have diabetes and people who do not have diabetes. Low blood sugar can happen quickly, and it can be an emergency. Treating Low Blood Sugar Low blood sugar is often treated by eating or drinking something sugary right away. If you can think clearly and swallow safely, follow the 15:15 rule:  Take 15 grams of a fast-acting carb (carbohydrate). Some fast-acting carbs are: ? 1 tube of glucose gel. ? 3 sugar tablets (glucose pills). ? 6-8 pieces of hard candy. ? 4 oz (120 mL) of fruit juice. ? 4 oz (120 mL) of regular (not diet) soda.  Check your blood sugar 15 minutes after you take the carb.  If your blood sugar is still at or below 70 mg/dL (3.9 mmol/L), take 15 grams of a carb again.  If your blood sugar does not go above 70 mg/dL (3.9 mmol/L) after 3 tries, get help right away.  After your blood sugar goes back to normal, eat a meal or a snack within 1 hour.  Treating Very Low Blood Sugar If your blood sugar is at or below 54 mg/dL (3 mmol/L), you have very low blood sugar (severe hypoglycemia). This is an emergency. Do not wait to see if the symptoms will go away. Get medical  help right away. Call your local emergency services (911 in the [place] Do not drive yourself to the hospital. If you have very low blood sugar and you cannot eat or drink, you may need a glucagon shot (injection). A family member or friend should learn how to check your blood sugar and how to give you a glucagon shot. Ask your doctor if you need to have a glucagon shot kit at home. Follow these instructions at home: General instructions  Avoid any diets that cause you to not eat enough food. Talk with your doctor before you start any new diet.  Take over-the-counter and prescription medicines only as told by your doctor.  Limit alcohol to no more than 1 drink per day for nonpregnant women and 2 drinks per day for men. One drink equals 12 oz of beer, 5 oz of wine, or 1 oz of hard liquor.  Keep all follow-up visits as told by your doctor. This is important. If You Have Diabetes:   Make sure you know the symptoms of low blood sugar.  Always keep a source of sugar with you, such as: ? Sugar. ? Sugar tablets. ? Glucose gel. ? Fruit juice. ? Regular soda (not diet soda). ? Milk. ? Hard candy. ? Honey.  Take your medicines as told.  Follow your exercise and meal plan. ? Eat on time. Do not skip meals. ? Follow your sick day plan when  you cannot eat or drink normally. Make this plan ahead of time with your doctor.  Check your blood sugar as often as told by your doctor. Always check before and after exercise.  Share your diabetes care plan with: ? Your work or school. ? People you live with.  Check your pee (urine) for ketones: ? When you are sick. ? As told by your doctor.  Carry a card or wear jewelry that says you have diabetes. If You Have Low Blood Sugar From Other Causes:   Check your blood sugar as often as told by your doctor.  Follow instructions from your doctor about what you cannot eat or drink. Contact a doctor if:  You have trouble keeping your blood sugar  in your target range.  You have low blood sugar often. Get help right away if:  You still have symptoms after you eat or drink something sugary.  Your blood sugar is at or below 54 mg/dL (3 mmol/L).  You have jerky movements that you cannot control.  You pass out. These symptoms may be an emergency. Do not wait to see if the symptoms will go away. Get medical help right away. Call your local emergency services (911 in the [place] Do not drive yourself to the hospital. This information is not intended to replace advice given to you by your health care provider. Make sure you discuss any questions you have with your health care provider. Document Released: 09/13/2009 Document Revised: 11/25/2015 Document Reviewed: 07/23/2015 Elsevier Interactive Patient Education  [employer].

## 2017-08-17 ENCOUNTER — Other Ambulatory Visit: Payer: Self-pay | Admitting: Student in an Organized Health Care Education/Training Program

## 2017-08-17 DIAGNOSIS — Z794 Long term (current) use of insulin: Secondary | ICD-10-CM

## 2017-08-17 DIAGNOSIS — E118 Type 2 diabetes mellitus with unspecified complications: Secondary | ICD-10-CM

## 2017-08-17 MED ORDER — GLUCOSE BLOOD VI STRP: ORAL_STRIP | 12 refills | Status: DC

## 2017-08-17 NOTE — Telephone Encounter (Signed)
Ordered test strips for TID and resent to pharmacy

## 2017-08-17 NOTE — Assessment & Plan Note (Signed)
CBG 97 today. Patient reports she did eat breakfast and took her insulin. Had attending send in test strips for patient to ensure that pharmacy would fill them through Medicare. Have instructed patient to start testing glucose at home. If below 80 needs to call for adjustment of insulin. Likely needs lower dose of insulin if having persistent episodes of hypoglycemia. Patient able to verbalize hypoglycemic symptoms and appropriate management plan.

## 2017-08-17 NOTE — Telephone Encounter (Signed)
Contacted Pharmacy.  Medicare is ONLY paying for testing and supplies for 3x daily now.   Will not cover anything more.  Will forward back to MD. Nyja Westbrook, Salome Spotted, [place]

## 2017-08-17 NOTE — Telephone Encounter (Signed)
Pt was told by pharmacy the dr needs to call the insurance company to release the test strips. Please advise

## 2017-08-22 ENCOUNTER — Other Ambulatory Visit: Payer: Self-pay | Admitting: Student in an Organized Health Care Education/Training Program

## 2017-08-22 DIAGNOSIS — M545 Low back pain, unspecified: Secondary | ICD-10-CM

## 2017-08-28 ENCOUNTER — Other Ambulatory Visit: Payer: Self-pay

## 2017-08-28 ENCOUNTER — Encounter: Payer: Self-pay | Admitting: Student in an Organized Health Care Education/Training Program

## 2017-08-28 ENCOUNTER — Ambulatory Visit (INDEPENDENT_AMBULATORY_CARE_PROVIDER_SITE_OTHER): Payer: Medicare Other | Admitting: Student in an Organized Health Care Education/Training Program

## 2017-08-28 VITALS — BP 132/72 | HR 88 | Temp 97.6°F | Ht 64.0 in | Wt 164.6 lb

## 2017-08-28 DIAGNOSIS — M545 Low back pain, unspecified: Secondary | ICD-10-CM

## 2017-08-28 MED ORDER — CYCLOBENZAPRINE HCL 10 MG PO TABS: 10.0000 mg | ORAL_TABLET | Freq: Every evening | ORAL | 0 refills | Status: DC | PRN

## 2017-08-28 NOTE — Patient Instructions (Addendum)
It was a pleasure seeing you today in our clinic. Today we discussed back pain. Here is the treatment plan we have discussed and agreed upon together:  Please continue to use heating pads at home. You may use ibuprofen (motrin, advil) or Naproxen (Aleve) for pain for a short period. You may use tylenol for pain.   I also sent a muscle relaxer to your pharmacy to use as needed for pain, at night and not before working or driving.  Our clinic's number is [phone]. Please call with questions or concerns about what we discussed today.  Be well, Dr. Burr Medico

## 2017-08-28 NOTE — Progress Notes (Signed)
   CC: lower back pain  History obtained with help of an interpreter  HPI: Alicia [name] is a 62 y.o. female with PMH significant for deafness, T2DM, HTN who presents to [facility] today with back pain.  Back pain - Gradual onset two weeks ago, right sided flank pain - pain has been constant since onset - has tried icy hot, tylenol with some palliation of symptoms - has been out of baclofen, however feels this has not helped much - no fevers, no dysuria or urinary frequency - no IVDU - no fecal incontinence or groin paresthesias  Review of Symptoms:  See HPI for ROS.   CC, SH/smoking status, and VS noted.  Objective: BP 132/72   Pulse 88   Temp 97.6 F (36.4 C) (Oral)   Ht 5\' 4"  (1.626 m)   Wt 164 lb 9.6 oz (74.7 kg)   SpO2 98%   BMI 28.25 kg/m  GEN: NAD, alert, cooperative, and pleasant. BACK: Back Exam:  Inspection: Unremarkable  Palpable tenderness: Mild over right flank region, no CVA tenderness Range of Motion:  Flexion 45 deg; Extension 45 deg; Side Bending to 45 deg bilaterally; Rotation to 45 deg bilaterally  Leg strength: Quad: 5/5 Hamstring: 5/5 Hip flexor: 5/5 Hip abductors: 5/5  Strength at foot: Plantar-flexion: 5/5 Dorsi-flexion: 5/5 Eversion: 5/5 Inversion: 5/5  Sensory change: Gross sensation intact to all lumbar and sacral dermatomes.  Reflexes: 2+ at both patellar tendons, 2+ at achilles tendons, Babinski's downgoing.  Gait unremarkable. PSYCH: AAOx3, appropriate affect   Assessment and plan:  Low back pain without sciatica Pain is most likely musculo-skeletal. No red flags by history or exam. We can try flexeril, patient has had baclofen in the past and does not feel it worked, though she has not tried with this episode of pain. Musculoskeletal back pain can last for several weeks. Return precautions discussed.   Meds ordered this encounter  Medications  . cyclobenzaprine (FLEXERIL) 10 MG tablet    Sig: Take 1 tablet (10 mg total) by mouth at bedtime  as needed for muscle spasms.    Dispense:  30 tablet    Refill:  0    Everrett Coombe, MD,MS,  PGY2 09/04/2017 8:54 PM

## 2017-08-30 ENCOUNTER — Ambulatory Visit (INDEPENDENT_AMBULATORY_CARE_PROVIDER_SITE_OTHER): Payer: Medicare Other | Admitting: Internal Medicine

## 2017-08-30 ENCOUNTER — Encounter: Payer: Self-pay | Admitting: Internal Medicine

## 2017-08-30 VITALS — BP 122/50 | HR 94 | Ht 64.0 in | Wt 167.0 lb

## 2017-08-30 DIAGNOSIS — R002 Palpitations: Secondary | ICD-10-CM | POA: Insufficient documentation

## 2017-08-30 DIAGNOSIS — R0789 Other chest pain: Secondary | ICD-10-CM | POA: Diagnosis not present

## 2017-08-30 DIAGNOSIS — I10 Essential (primary) hypertension: Secondary | ICD-10-CM | POA: Diagnosis not present

## 2017-08-30 DIAGNOSIS — R079 Chest pain, unspecified: Secondary | ICD-10-CM | POA: Insufficient documentation

## 2017-08-30 DIAGNOSIS — E785 Hyperlipidemia, unspecified: Secondary | ICD-10-CM | POA: Insufficient documentation

## 2017-08-30 NOTE — Patient Instructions (Addendum)
Medication Instructions:  Your physician recommends that you continue on your current medications as directed. Please refer to the Current Medication list given to you today.  -- If you need a refill on your cardiac medications before your next appointment, please call your pharmacy. --  Labwork: None ordered  Testing/Procedures: Your physician has recommended that you wear a 14 day event monitor. Event monitors are medical devices that record the heart's electrical activity. Doctors most often [name] these monitors to diagnose arrhythmias. Arrhythmias are problems with the speed or rhythm of the heartbeat. The monitor is a small, portable device. You can wear one while you do your normal daily activities. This is usually used to diagnose what is causing palpitations/syncope (passing out).   Your physician has requested that you have a lexiscan myoview. For further information please visit [email or web address]. Please follow instruction sheet, as given.     Follow-Up: Your physician wants you to follow-up in: 1 months with Dr. Saunders Revel.    You will receive a reminder letter in the mail two months in advance. If you don't receive a letter, please call our office to schedule the follow-up appointment.  Thank you for choosing [facility]!!    Any Other Special Instructions Will Be Listed Below (If Applicable).

## 2017-08-30 NOTE — Progress Notes (Signed)
New Outpatient Visit Date: 08/30/2017  Referring Provider: Mercy Riding, MD [place], [place] [id number]  Chief Complaint: Chest pain  HPI:  Alicia [name] is a 62 y.o. female who is being seen today for the evaluation of chest pain at the request of Cyndia Skeeters, Taye T, MD. She has a history of hypertension, hyperlipidemia, type 2 diabetes mellitus, obstructive sleep apnea, and deafness. She complained of a 1 month history of chest pain when climbing stairs at a visit with her PCP in 06/2017. Today, Alicia [name] reports multiple episodes of chest discomfort. She describes a severe cramp-like pain in the left upper chest. It occurs randomly and is not clearly exertional. It has been happening about once a week over the last 2 months. Symptoms usually last a few seconds and then resolve spontaneously. At times, she has also noticed palpitations lasting up to 2 minutes with accompanying shortness of breath. On one occasion, the palpitations coincided with the aforementioned chest pain, though they are typically independent.  Alicia [name] denies a history of prior cardiac disease. She believes that she underwent an echocardiogram many years ago, which she believes was normal. She denies prior stress testing or heart catheterization. She has not had any lightheadedness, orthopnea, PND, or edema. She does not exercise regularly and has recently had issues with low back and right leg pain.  --------------------------------------------------------------------------------------------------  Cardiovascular History & Procedures: Cardiovascular Problems:  Chest pain  Palpitations  Risk Factors:  Hypertension, hyperlipidemia, and diabetes mellitus  Cath/PCI:  None  CV Surgery:  None  EP Procedures and Devices:  None  Non-Invasive Evaluation(s):  None available  Recent CV Pertinent Labs: Lab Results  Component Value Date   CHOL 188 06/20/2017   HDL 84 06/20/2017   LDLCALC 93  06/20/2017   LDLDIRECT 78 07/10/2013   TRIG 57 06/20/2017   CHOLHDL 2.2 06/20/2017   CHOLHDL 2.3 07/28/2016   K 4.1 06/20/2017   MG 1.4 (L) 11/09/2011   BUN 12 06/20/2017   CREATININE 0.88 06/20/2017   CREATININE 0.75 07/28/2016    --------------------------------------------------------------------------------------------------  Past Medical History:  Diagnosis Date  . Anemia   . Complete deafness    Meningitis at age 65  . Deaf   . Diabetes mellitus   . Diabetic neuropathy ([place])   . Ganglion cyst 06/22/2011  . Gastroparesis   . GERD (gastroesophageal reflux disease)   . H/O: C-section   . Hyperlipidemia   . Hypertension   . Neuromuscular disorder ([place])    diabetic neuropathy  . S/P appy     Past Surgical History:  Procedure Laterality Date  . APPENDECTOMY    . cardiolyte EF 77%, no ischemia in 2006  2006  . CARPAL TUNNEL RELEASE Right 04/20/2015   Procedure: RIGHT CARPAL TUNNEL RELEASE;  Surgeon: Daryll Brod, MD;  Location: [facility];  Service: Orthopedics;  Laterality: Right;  . CARPAL TUNNEL RELEASE Left 11/04/2015   Procedure: CARPAL TUNNEL RELEASE;  Surgeon: Daryll Brod, MD;  Location: [facility];  Service: Orthopedics;  Laterality: Left;  . CESAREAN SECTION    . OPEN REDUCTION INTERNAL FIXATION (ORIF) DISTAL RADIAL FRACTURE Left 11/04/2015   Procedure: OPEN REDUCTION INTERNAL FIXATION (ORIF) LEFT DISTAL RADIAL FRACTURE POSSIBLE BONE GRAFT;  Surgeon: Daryll Brod, MD;  Location: [facility];  Service: Orthopedics;  Laterality: Left;  . TRIGGER FINGER RELEASE Right 04/20/2015   Procedure: RELEASE TRIGGER FINGER/A-1 PULLEY RIGHT MIDDLE FINGER,RIGHT RING FINGER;  Surgeon: Daryll Brod, MD;  Location: [facility];  Service: Orthopedics;  Laterality: Right;  . TUBAL LIGATION    . ULNAR NERVE TRANSPOSITION Right 04/20/2015   Procedure: RIGHT ULNAR NERVE DECOMPRESSION;  Surgeon: Daryll Brod, MD;  Location: [facility];  Service: Orthopedics;  Laterality: Right;  . UTERINE FIBROID EMBOLIZATION  2007  . WRIST SURGERY     Cyst removed on left  . WRIST SURGERY Right 1985   tendon repair R wrist    Current Meds  Medication Sig  . ACCU-CHEK SOFTCLIX LANCETS lancets TEST 4 TIMES A DAY  . acetaminophen-codeine (TYLENOL #3) 300-30 MG tablet Take 1 tablet by mouth at bedtime as needed for moderate pain.  Marland Kitchen aspirin (BAYER CHILDRENS ASPIRIN) 81 MG chewable tablet Chew 81 mg by mouth daily.    . baclofen (LIORESAL) 10 MG tablet TAKE 1 TABLET BY MOUTH TWICE A DAY AS NEEDED FOR MUSCLE SPASMS  . benzonatate (TESSALON) 100 MG capsule Take 2 capsules (200 mg total) by mouth 3 (three) times daily as needed for cough.  . Blood Glucose Monitoring Suppl (ACCU-CHEK AVIVA) device Use as instructed  . carvedilol (COREG) 12.5 MG tablet TAKE 1 TABLET BY MOUTH TWICE A DAY WITH FOOD  . cholecalciferol (VITAMIN D) 1000 UNITS tablet Take 1,000 Units by mouth daily.  . cyanocobalamin 500 MCG tablet Take 500 mcg by mouth daily.  . cyclobenzaprine (FLEXERIL) 10 MG tablet Take 1 tablet (10 mg total) by mouth at bedtime as needed for muscle spasms.  . fluticasone (FLONASE) 50 MCG/ACT nasal spray Place 1 spray into both nostrils daily as needed.   . gabapentin (NEURONTIN) 400 MG capsule Take 3 capsules (1,200 mg total) by mouth 2 (two) times daily as needed (pain).  Marland Kitchen glucose blood (ACCU-CHEK AVIVA PLUS) test strip CHECK SUGAR 3 TIMES A DAILY.  Marland Kitchen guaiFENesin 200 MG tablet Take 1 tablet (200 mg total) by mouth every 8 (eight) hours as needed for cough or to loosen phlegm.  . hydrochlorothiazide (HYDRODIURIL) 12.5 MG tablet Take 1 tablet (12.5 mg total) by mouth daily.  . Insulin Glargine (BASAGLAR KWIKPEN) 100 UNIT/ML SOPN INJECT 0.08 MLS (8 UNITS TOTAL) INTO THE SKIN DAILY.  Marland Kitchen Insulin Pen Needle 31G X 5 MM MISC 1 Container by Does not apply route once as needed.  Marland Kitchen losartan (COZAAR) 100 MG tablet Take 1 tablet (100 mg total)  by mouth daily.  . meloxicam (MOBIC) 15 MG tablet TAKE 1 TABLET BY MOUTH EVERY DAY  . metFORMIN (GLUCOPHAGE) 1000 MG tablet Take 1 tablet (1,000 mg total) by mouth 2 (two) times daily.  Marland Kitchen NOVOLOG 100 UNIT/ML injection INJECT 1-4 UNITS INTO THE SKIN 3 (THREE) TIMES DAILY WITH MEALS.  Marland Kitchen pantoprazole (PROTONIX) 40 MG tablet TAKE 1 TABLET BY MOUTH EVERY DAY  . PATADAY 0.2 % SOLN PLACE 1 DROP INTO EACH EYE EVERY DAY AS NEEDED  . polyethylene glycol powder (GLYCOLAX/MIRALAX) powder TAKE 17 GRAMS MIXED IN LIQUID THREE TIMES A WEEK  . rosuvastatin (CRESTOR) 5 MG tablet Take 1 tablet (5 mg total) by mouth daily at 6 PM.  . traMADol (ULTRAM) 50 MG tablet Take 50-100 mg by mouth every 6 (six) hours as needed.  Marland Kitchen VICTOZA 18 MG/3ML SOPN INJECT 0.1-0.2 ML (0.6-1.2 MG TOTAL) INTO THE SKIN EVERY DAY    Allergies: Sulfonamide derivatives; Lipitor [atorvastatin calcium]; Ramipril; and Trulicity [dulaglutide]  Social History   Socioeconomic History  . Marital status: Divorced    Spouse name: Not on file  . Number of children:  3  . Years of education: 23  . Highest education level: Not on file  Social Needs  . Financial resource strain: Not on file  . Food insecurity - worry: Not on file  . Food insecurity - inability: Not on file  . Transportation needs - medical: Not on file  . Transportation needs - non-medical: Not on file  Occupational History  . Occupation: daycare    Employer: [employer]  Tobacco Use  . Smoking status: Never Smoker  . Smokeless tobacco: Never Used  Substance and Sexual Activity  . Alcohol use: Yes    Alcohol/week: 1.8 oz    Types: 3 Glasses of wine per week  . Drug use: No  . Sexual activity: Yes    Birth control/protection: Surgical  Other Topics Concern  . Not on file  Social History Narrative  . Not on file    Family History  Problem Relation Age of Onset  . Hypertension Mother   . Heart attack Mother 89  . Diabetes Father   . Cancer Maternal  Aunt   . Cancer Maternal Grandmother     Review of Systems: A 12-system review of systems was performed and was negative except as noted in the HPI.  --------------------------------------------------------------------------------------------------  Physical Exam: BP (!) 122/50   Pulse 94   Ht 5\' 4"  (1.626 m)   Wt 167 lb (75.8 kg)   SpO2 95%   BMI 28.67 kg/m   General:  Overweight woman, seated comfortably in the exam room. HEENT: No conjunctival pallor or scleral icterus. Moist mucous membranes. OP clear. Neck: Supple without lymphadenopathy, thyromegaly, JVD, or HJR. No carotid bruit. Lungs: Normal work of breathing. Clear to auscultation bilaterally without wheezes or crackles. Heart: Regular rate and rhythm without murmurs, rubs, or gallops. Non-displaced PMI. Abd: Bowel sounds present. Soft, NT/ND without hepatosplenomegaly Ext: No lower extremity edema. Radial, PT, and DP pulses are 2+ bilaterally Skin: Warm and dry without rash. Neuro: CNIII-XII intact. Strength and fine-touch sensation intact in upper and lower extremities bilaterally. Psych: Normal mood and affect.  EKG:  Normal sinus rhythm without abnormalities.  Lab Results  Component Value Date   WBC 5.7 09/28/2014   HGB 13.9 11/04/2015   HCT 41.0 11/04/2015   MCV 91.3 09/28/2014   PLT 336 09/28/2014    Lab Results  Component Value Date   NA 140 06/20/2017   K 4.1 06/20/2017   CL 99 06/20/2017   CO2 26 06/20/2017   BUN 12 06/20/2017   CREATININE 0.88 06/20/2017   GLUCOSE 189 (H) 06/20/2017   ALT 10 06/20/2017    Lab Results  Component Value Date   CHOL 188 06/20/2017   HDL 84 06/20/2017   LDLCALC 93 06/20/2017   LDLDIRECT 78 07/10/2013   TRIG 57 06/20/2017   CHOLHDL 2.2 06/20/2017     --------------------------------------------------------------------------------------------------  ASSESSMENT AND PLAN: Atypical chest pain Brief, cramp-like chest pain is most likely muscular skeletal in  etiology. However, Alicia [name] has multiple cardiac risk factors, including hypertension, hyperlipidemia, and diabetes mellitus. We have agreed to proceed with pharmacologic myocardial perfusion stress testing for further evaluation. I have asked Alicia [name] to continue her current medications, including low-dose aspirin.  Palpitations These have also been present over the last few months but are not clearly associated with the aforementioned chest pain. We will obtain a 14 day event monitor for further assessment.  Hypertension Blood pressure normal to slightly low today, albeit asymptomatic. No medication changes at this time.  Hyperlipidemia Continue statin therapy, per PCP.  Follow-up: Return to clinic in one month.  Nelva Bush, MD 08/30/2017 7:55 PM

## 2017-09-02 ENCOUNTER — Other Ambulatory Visit: Payer: Self-pay | Admitting: Family Medicine

## 2017-09-02 DIAGNOSIS — E119 Type 2 diabetes mellitus without complications: Secondary | ICD-10-CM

## 2017-09-02 DIAGNOSIS — Z794 Long term (current) use of insulin: Principal | ICD-10-CM

## 2017-09-04 ENCOUNTER — Encounter: Payer: Self-pay | Admitting: Student in an Organized Health Care Education/Training Program

## 2017-09-04 NOTE — Assessment & Plan Note (Signed)
Pain is most likely musculo-skeletal. No red flags by history or exam. We can try flexeril, patient has had baclofen in the past and does not feel it worked, though she has not tried with this episode of pain. Musculoskeletal back pain can last for several weeks. Return precautions discussed.

## 2017-09-10 ENCOUNTER — Other Ambulatory Visit: Payer: Self-pay | Admitting: Student in an Organized Health Care Education/Training Program

## 2017-09-10 DIAGNOSIS — I10 Essential (primary) hypertension: Secondary | ICD-10-CM

## 2017-09-11 ENCOUNTER — Telehealth (HOSPITAL_COMMUNITY): Payer: Self-pay | Admitting: *Deleted

## 2017-09-11 NOTE — Telephone Encounter (Signed)
Left message on voicemail through sign language interpreter ID#[id number] reference to upcoming appointment scheduled for 09/14/17. Phone number given for a call back so details instructions can be given. Alicia [name], [name]

## 2017-09-12 ENCOUNTER — Other Ambulatory Visit: Payer: Self-pay

## 2017-09-12 ENCOUNTER — Ambulatory Visit (INDEPENDENT_AMBULATORY_CARE_PROVIDER_SITE_OTHER): Payer: Medicare Other | Admitting: Student in an Organized Health Care Education/Training Program

## 2017-09-12 ENCOUNTER — Encounter: Payer: Self-pay | Admitting: Student in an Organized Health Care Education/Training Program

## 2017-09-12 VITALS — BP 122/66 | HR 86 | Temp 97.8°F | Ht 64.0 in | Wt 165.4 lb

## 2017-09-12 DIAGNOSIS — E119 Type 2 diabetes mellitus without complications: Secondary | ICD-10-CM

## 2017-09-12 DIAGNOSIS — M545 Low back pain, unspecified: Secondary | ICD-10-CM

## 2017-09-12 DIAGNOSIS — Z794 Long term (current) use of insulin: Secondary | ICD-10-CM

## 2017-09-12 DIAGNOSIS — K5900 Constipation, unspecified: Secondary | ICD-10-CM

## 2017-09-12 LAB — POCT GLYCOSYLATED HEMOGLOBIN (HGB A1C): Hemoglobin A1C: 8

## 2017-09-12 MED ORDER — POLYETHYLENE GLYCOL 3350 17 GM/SCOOP PO POWD: 17.0000 g | Freq: Three times a day (TID) | ORAL | 1 refills | Status: DC | PRN

## 2017-09-12 MED ORDER — SENNOSIDES-DOCUSATE SODIUM 8.6-50 MG PO TABS: 1.0000 | ORAL_TABLET | Freq: Every day | ORAL | 0 refills | Status: DC | PRN

## 2017-09-12 NOTE — Progress Notes (Signed)
   CC: Constipation  HPI: Alicia [name] is a 62 y.o. female   Constipation - Has been feeling very bloated all week - has been told her stomach is gurgling and makes loud noises (patient unable to hear due to deafness) - increased flatulence - last bowel movement was this morning, BMs have been daily, small volume "little bits" all week - she feels constipated - She has taken miralax, one capful 2 times per day for 3 days - no diarrhea - no blood in the stool  Back pain - previously evaluated and thought to be MSK in origin last week.  Continues to have no red flag symptoms: No numbness, weakness, fevers.  Pain is lumbar.  She is previously been a muscle relaxer which made her feel drowsy, even when she took it at night she felt drowsy throughout her workday the following day.  Pain is the worst after she works all day.  Review of Symptoms:  See HPI for ROS.    CC, SH/smoking status, and VS noted.  Objective: BP 122/66   Pulse 86   Temp 97.8 F (36.6 C) (Oral)   Ht 5\' 4"  (1.626 m)   Wt 75 kg (165 lb 6.4 oz)   SpO2 98%   BMI 28.39 kg/m  GEN: NAD, alert, cooperative, and pleasant. RESPIRATORY: clear to auscultation bilaterally with no wheezes, rhonchi or rales, good effort CV: RRR, no m/r/g GI: soft, non-tender, non-distended, no hepatosplenomegaly SKIN: warm and dry, no rashes or lesions NEURO: II-XII grossly intact, normal gait, peripheral sensation intact BACK: mild ttp in lumbar regions, no cervical process tenderness, no sensory deficit PSYCH: AAOx3, appropriate affect  Assessment and plan:  Constipation - increase miralax to TID until has BM, then can decrease to once daily and titrate as needed for 1-2 soft stools per day - senna - colace - increase water intake, fiber intake - list of fibrous foods given  Low back pain without sciatica MSK. Seen recently for this issue. Did not take muscle relaxer because it made her have too much daytime sleepiness even  when she took it before bed. - no red flags by hx or exam - reviewed with patient that this pain may take several weeks to resolve - went over how to roll out her back muscles with a lacrosse ball against the wall to help loosen up lumbar muscles - ibuprofen/tylenol PRN - return precautions provided   A1c completed today as patient left but asked patient to follow up for a diabetes visit.  Orders Placed This Encounter  Procedures  . HgB A1c    Meds ordered this encounter  Medications  . senna-docusate (SENOKOT-S) 8.6-50 MG tablet    Sig: Take 1 tablet by mouth daily as needed for mild constipation or moderate constipation.    Dispense:  30 tablet    Refill:  0  . polyethylene glycol powder (GLYCOLAX/MIRALAX) powder    Sig: Take 17 g by mouth 3 (three) times daily as needed.    Dispense:  3350 g    Refill:  1    Everrett Coombe, MD,MS,  PGY2 09/12/2017 1:49 PM

## 2017-09-12 NOTE — Assessment & Plan Note (Signed)
-   increase miralax to TID until has BM, then can decrease to once daily and titrate as needed for 1-2 soft stools per day - senna - colace - increase water intake, fiber intake - list of fibrous foods given

## 2017-09-12 NOTE — Assessment & Plan Note (Signed)
MSK. Seen recently for this issue. Did not take muscle relaxer because it made her have too much daytime sleepiness even when she took it before bed. - no red flags by hx or exam - reviewed with patient that this pain may take several weeks to resolve - went over how to roll out her back muscles with a lacrosse ball against the wall to help loosen up lumbar muscles - ibuprofen/tylenol PRN - return precautions provided

## 2017-09-12 NOTE — Patient Instructions (Addendum)
It was a pleasure seeing you today in our clinic. Here is the treatment plan we have discussed and agreed upon together:  - Please schedule follow up for diabetes  For constipation:  - increase miralax to three times daily until you have a bowel movement. - Senakot may also be used for constipation - increase water intake, fiber intake - list of fibrous foods below  For back pain: - continue ibuprofen/tylenol - try using a tennis ball to roll out muscles in the back, against the wall as discussed  Our clinic's number is [phone]. Please call with questions or concerns about what we discussed today.  Be well, Dr. Burr Medico  What is fiber? Fiber is a substance found in some fruits, vegetables, and grains. Most fiber passes through your body without being digested. But it can affect how you digest other foods, and it can also improve your bowel movements. There are 2 kinds of fiber. One kind is called "soluble fiber" and is found in fruits, oats, barley, beans, and peas. The other kind is called "insoluble fiber," and is found in wheat, rye, and other grains. Both kinds of fiber that you eat are called "dietary fiber." Why is fiber important to my health? Fiber can help make your bowel movements softer and more regular. Adding fiber to your diet can help with problems including constipation, hemorrhoids, and diarrhea. Plus, it can help prevent "accidents" if you have trouble controlling your bowel movements. Getting enough fiber can also help lower your risk of heart disease, stroke, and type 2 diabetes. That's because fiber can help lower cholesterol and help control blood sugar. How much fiber do I need? The recommended amount of fiber is 20 to 35 grams a day. The nutrition label on packaged foods can show you how much fiber you are getting in each serving (figure 1). How can I make sure I'm getting enough fiber? To make sure that you're getting enough fiber, eat plenty of the fruits,  vegetables, and grains that contain fiber (table 1 and figure 2). Many breakfast cereals also have a lot of fiber. If you can't get enough fiber from food, you can add wheat bran to the foods you do eat. Or you can get fiber supplements (table 2). These often come in a powder and should be added to water or another liquid. What are the side effects of fiber? When you start eating more fiber, your belly might feel bloated, or you might have gas or cramps. You can avoid these side effects by adding fiber to your diet slowly. Some people feel worse when they eat more fiber or take fiber supplements. If you feel worse after adding more fiber to your diet, you can try decreasing the amount of fiber to see if that helps.

## 2017-09-13 ENCOUNTER — Telehealth (HOSPITAL_COMMUNITY): Payer: Self-pay | Admitting: *Deleted

## 2017-09-13 ENCOUNTER — Other Ambulatory Visit: Payer: Self-pay | Admitting: Student in an Organized Health Care Education/Training Program

## 2017-09-13 NOTE — Telephone Encounter (Signed)
Patient given detailed instructions per Myocardial Perfusion Study Information Sheet for the test on 09/14/17 at 7:15. Patient notified to arrive 15 minutes early and that it is imperative to arrive on time for appointment to keep from having the test rescheduled.  If you need to cancel or reschedule your appointment, please call the office within 24 hours of your appointment. . Patient verbalized understanding.Alicia [name]

## 2017-09-13 NOTE — Telephone Encounter (Signed)
err

## 2017-09-14 ENCOUNTER — Ambulatory Visit (HOSPITAL_COMMUNITY): Payer: Medicare Other | Attending: Cardiology

## 2017-09-14 ENCOUNTER — Ambulatory Visit (INDEPENDENT_AMBULATORY_CARE_PROVIDER_SITE_OTHER): Payer: Medicare Other

## 2017-09-14 VITALS — Ht 64.0 in | Wt 167.0 lb

## 2017-09-14 DIAGNOSIS — I10 Essential (primary) hypertension: Secondary | ICD-10-CM | POA: Insufficient documentation

## 2017-09-14 DIAGNOSIS — R0789 Other chest pain: Secondary | ICD-10-CM

## 2017-09-14 DIAGNOSIS — R002 Palpitations: Secondary | ICD-10-CM

## 2017-09-14 DIAGNOSIS — R110 Nausea: Secondary | ICD-10-CM

## 2017-09-14 DIAGNOSIS — E119 Type 2 diabetes mellitus without complications: Secondary | ICD-10-CM | POA: Diagnosis not present

## 2017-09-14 DIAGNOSIS — I2510 Atherosclerotic heart disease of native coronary artery without angina pectoris: Secondary | ICD-10-CM | POA: Diagnosis not present

## 2017-09-14 DIAGNOSIS — Z794 Long term (current) use of insulin: Secondary | ICD-10-CM | POA: Insufficient documentation

## 2017-09-14 DIAGNOSIS — R079 Chest pain, unspecified: Secondary | ICD-10-CM | POA: Diagnosis not present

## 2017-09-14 LAB — MYOCARDIAL PERFUSION IMAGING
LV dias vol: 76 mL (ref 46–106)
LV sys vol: 30 mL
Peak HR: 111 {beats}/min
RATE: 0.32
Rest HR: 88 {beats}/min
SDS: 0
SRS: 0
SSS: 0
TID: 1

## 2017-09-14 MED ORDER — TECHNETIUM TC 99M TETROFOSMIN IV KIT
10.6000 | PACK | Freq: Once | INTRAVENOUS | Status: AC | PRN
  Administered 2017-09-14: 10.6 via INTRAVENOUS
  Filled 2017-09-14: qty 11

## 2017-09-14 MED ORDER — REGADENOSON 0.4 MG/5ML IV SOLN
0.4000 mg | Freq: Once | INTRAVENOUS | Status: AC
  Administered 2017-09-14: 0.4 mg via INTRAVENOUS

## 2017-09-14 MED ORDER — TECHNETIUM TC 99M TETROFOSMIN IV KIT
31.7000 | PACK | Freq: Once | INTRAVENOUS | Status: AC | PRN
  Administered 2017-09-14: 31.7 via INTRAVENOUS
  Filled 2017-09-14: qty 32

## 2017-09-14 MED ORDER — AMINOPHYLLINE 25 MG/ML IV SOLN
75.0000 mg | Freq: Once | INTRAVENOUS | Status: AC
  Administered 2017-09-14: 75 mg via INTRAVENOUS

## 2017-09-20 ENCOUNTER — Other Ambulatory Visit: Payer: Self-pay | Admitting: Student in an Organized Health Care Education/Training Program

## 2017-09-20 DIAGNOSIS — I10 Essential (primary) hypertension: Secondary | ICD-10-CM

## 2017-09-20 NOTE — Telephone Encounter (Signed)
Opened in error. Alicia [name], CMA 

## 2017-09-27 ENCOUNTER — Ambulatory Visit (INDEPENDENT_AMBULATORY_CARE_PROVIDER_SITE_OTHER): Payer: Medicare Other | Admitting: Internal Medicine

## 2017-09-27 ENCOUNTER — Encounter: Payer: Self-pay | Admitting: Internal Medicine

## 2017-09-27 VITALS — BP 132/74 | HR 92 | Ht 64.0 in | Wt 165.0 lb

## 2017-09-27 DIAGNOSIS — R0789 Other chest pain: Secondary | ICD-10-CM

## 2017-09-27 DIAGNOSIS — E785 Hyperlipidemia, unspecified: Secondary | ICD-10-CM

## 2017-09-27 DIAGNOSIS — R002 Palpitations: Secondary | ICD-10-CM | POA: Diagnosis not present

## 2017-09-27 DIAGNOSIS — E119 Type 2 diabetes mellitus without complications: Secondary | ICD-10-CM | POA: Diagnosis not present

## 2017-09-27 DIAGNOSIS — I10 Essential (primary) hypertension: Secondary | ICD-10-CM

## 2017-09-27 MED ORDER — CARVEDILOL 25 MG PO TABS: 25.0000 mg | ORAL_TABLET | Freq: Two times a day (BID) | ORAL | 3 refills | Status: DC

## 2017-09-27 NOTE — Progress Notes (Signed)
Follow-up Outpatient Visit Date: 09/27/2017  Primary Care Provider: Everrett Coombe, [place] [address]  Chief Complaint: Palpitations  HPI:  Alicia [name] is a 62 y.o. year-old female with history of hypertension, hyperlipidemia, type 2 diabetes mellitus, obstructive sleep apnea, and deafness, who presents for follow-up of chest pain. I met her a month ago, at which time she reported multiple episodes of chest pain that occurred randomly.  The pain was cramp-like and usually only lasted a few seconds at a time.  She also reported intermittent palpitations.  We agreed to obtain a pharmacologic myocardial perfusion stress test and 14-day event monitor.  Today, Alicia [name] reports that her chest cramps have resolved. She notes only a single episode of sharp chest pain lasting a few seconds.  Palpitations are about the same, with almost daily fluttering in the chest.  She notices it mostly at night or after eating.  Event monitor is still in place.  She has noticed some itching from the event monitor leads.  She denies lightheadedness, edema, orthopnea, and nausea  --------------------------------------------------------------------------------------------------  Cardiovascular History & Procedures: Cardiovascular Problems:  Chest pain  Palpitations  Risk Factors:  Hypertension, hyperlipidemia, and diabetes mellitus  Cath/PCI:  None  CV Surgery:  None  EP Procedures and Devices:  None  Non-Invasive Evaluation(s):  Pharmacologic MPI (09/14/17): Normal study without ischemia or scar. LVEF 61%.  Recent CV Pertinent Labs: Lab Results  Component Value Date   CHOL 188 06/20/2017   HDL 84 06/20/2017   LDLCALC 93 06/20/2017   LDLDIRECT 78 07/10/2013   TRIG 57 06/20/2017   CHOLHDL 2.2 06/20/2017   CHOLHDL 2.3 07/28/2016   K 4.1 06/20/2017   MG 1.4 (L) 11/09/2011   BUN 12 06/20/2017   CREATININE 0.88 06/20/2017   CREATININE 0.75 07/28/2016    Past  medical and surgical history were reviewed and updated in EPIC.  Current Meds  Medication Sig  . ACCU-CHEK SOFTCLIX LANCETS lancets TEST 4 TIMES A DAY  . acetaminophen-codeine (TYLENOL #3) 300-30 MG tablet Take 1 tablet by mouth at bedtime as needed for moderate pain.  Marland Kitchen aspirin (BAYER CHILDRENS ASPIRIN) 81 MG chewable tablet Chew 81 mg by mouth daily.    . benzonatate (TESSALON) 100 MG capsule Take 2 capsules (200 mg total) by mouth 3 (three) times daily as needed for cough.  . Blood Glucose Monitoring Suppl (ACCU-CHEK AVIVA) device Use as instructed  . carvedilol (COREG) 12.5 MG tablet TAKE 1 TABLET BY MOUTH TWICE A DAY WITH FOOD  . cholecalciferol (VITAMIN D) 1000 UNITS tablet Take 1,000 Units by mouth daily.  . cyanocobalamin 500 MCG tablet Take 500 mcg by mouth daily.  . fluticasone (FLONASE) 50 MCG/ACT nasal spray Place 1 spray into both nostrils daily as needed.   . gabapentin (NEURONTIN) 400 MG capsule Take 3 capsules (1,200 mg total) by mouth 2 (two) times daily as needed (pain).  Marland Kitchen glucose blood (ACCU-CHEK AVIVA PLUS) test strip CHECK SUGAR 3 TIMES A DAILY.  Marland Kitchen guaiFENesin 200 MG tablet Take 1 tablet (200 mg total) by mouth every 8 (eight) hours as needed for cough or to loosen phlegm.  . hydrochlorothiazide (HYDRODIURIL) 12.5 MG tablet Take 1 tablet (12.5 mg total) by mouth daily.  . Insulin Glargine (BASAGLAR KWIKPEN) 100 UNIT/ML SOPN INJECT 0.08 MLS (8 UNITS TOTAL) INTO THE SKIN DAILY.  Marland Kitchen Insulin Pen Needle 31G X 5 MM MISC 1 Container by Does not apply route once as needed.  Marland Kitchen losartan (COZAAR) 100  MG tablet Take 1 tablet (100 mg total) by mouth daily.  . meloxicam (MOBIC) 15 MG tablet TAKE 1 TABLET BY MOUTH EVERY DAY  . metFORMIN (GLUCOPHAGE) 1000 MG tablet TAKE 1 TABLET BY MOUTH TWICE A DAY  . NOVOLOG 100 UNIT/ML injection INJECT 1-4 UNITS INTO THE SKIN 3 (THREE) TIMES DAILY WITH MEALS.  Marland Kitchen pantoprazole (PROTONIX) 40 MG tablet TAKE 1 TABLET BY MOUTH EVERY DAY  . PATADAY 0.2 %  SOLN PLACE 1 DROP INTO EACH EYE EVERY DAY AS NEEDED  . polyethylene glycol powder (GLYCOLAX/MIRALAX) powder TAKE 17 GRAMS MIXED IN LIQUID THREE TIMES A WEEK  . polyethylene glycol powder (GLYCOLAX/MIRALAX) powder Take 17 g by mouth 3 (three) times daily as needed.  . rosuvastatin (CRESTOR) 5 MG tablet Take 1 tablet (5 mg total) by mouth daily at 6 PM.  . senna-docusate (SENOKOT-S) 8.6-50 MG tablet Take 1 tablet by mouth daily as needed for mild constipation or moderate constipation.  . traMADol (ULTRAM) 50 MG tablet Take 50-100 mg by mouth every 6 (six) hours as needed.  Marland Kitchen VICTOZA 18 MG/3ML SOPN INJECT 0.1-0.2 ML (0.6-1.2 MG TOTAL) INTO THE SKIN EVERY DAY  . [DISCONTINUED] baclofen (LIORESAL) 10 MG tablet TAKE 1 TABLET BY MOUTH TWICE A DAY AS NEEDED FOR MUSCLE SPASMS  . [DISCONTINUED] cyclobenzaprine (FLEXERIL) 10 MG tablet Take 1 tablet (10 mg total) by mouth at bedtime as needed for muscle spasms.    Allergies: Sulfonamide derivatives; Lipitor [atorvastatin calcium]; Ramipril; and Trulicity [dulaglutide]  Social History   Socioeconomic History  . Marital status: Divorced    Spouse name: Not on file  . Number of children: 3  . Years of education: 35  . Highest education level: Not on file  Occupational History  . Occupation: daycare    Employer: [employer]  . Financial resource strain: Not on file  . Food insecurity:    Worry: Not on file    Inability: Not on file  . Transportation needs:    Medical: Not on file    Non-medical: Not on file  Tobacco Use  . Smoking status: Never Smoker  . Smokeless tobacco: Never Used  Substance and Sexual Activity  . Alcohol use: Yes    Alcohol/week: 1.8 oz    Types: 3 Glasses of wine per week  . Drug use: No  . Sexual activity: Yes    Birth control/protection: Surgical  Lifestyle  . Physical activity:    Days per week: Not on file    Minutes per session: Not on file  . Stress: Not on file  Relationships  .  Social connections:    Talks on phone: Not on file    Gets together: Not on file    Attends religious service: Not on file    Active member of club or organization: Not on file    Attends meetings of clubs or organizations: Not on file    Relationship status: Not on file  . Intimate partner violence:    Fear of current or ex partner: Not on file    Emotionally abused: Not on file    Physically abused: Not on file    Forced sexual activity: Not on file  Other Topics Concern  . Not on file  Social History Narrative  . Not on file    Family History  Problem Relation Age of Onset  . Hypertension Mother   . Heart attack Mother 62  . Diabetes Father   . Cancer  Maternal Aunt   . Cancer Maternal Grandmother     Review of Systems: A 12-system review of systems was performed and was negative except as noted in the HPI.  --------------------------------------------------------------------------------------------------  Physical Exam: BP 132/74   Pulse 92   Ht '5\' 4"'$  (1.626 m)   Wt 165 lb (74.8 kg)   SpO2 95%   BMI 28.32 kg/m   General: NAD.  Accompanied by sign interpreter. HEENT: No conjunctival pallor or scleral icterus. Moist mucous membranes.  OP clear. Neck: Supple without lymphadenopathy, thyromegaly, JVD, or HJR. Lungs: Normal work of breathing. Clear to auscultation bilaterally without wheezes or crackles. Heart: Regular rate and rhythm without murmurs, rubs, or gallops. Abd: Bowel sounds present. Soft, NT/ND. Ext: No lower extremity edema.  Skin: Warm and dry without rash.   Lab Results  Component Value Date   WBC 5.7 09/28/2014   HGB 13.9 11/04/2015   HCT 41.0 11/04/2015   MCV 91.3 09/28/2014   PLT 336 09/28/2014    Lab Results  Component Value Date   NA 140 06/20/2017   K 4.1 06/20/2017   CL 99 06/20/2017   CO2 26 06/20/2017   BUN 12 06/20/2017   CREATININE 0.88 06/20/2017   GLUCOSE 189 (H) 06/20/2017   ALT 10 06/20/2017    Lab Results    Component Value Date   CHOL 188 06/20/2017   HDL 84 06/20/2017   LDLCALC 93 06/20/2017   LDLDIRECT 78 07/10/2013   TRIG 57 06/20/2017   CHOLHDL 2.2 06/20/2017    --------------------------------------------------------------------------------------------------  ASSESSMENT AND PLAN: Atypical chest pain Minimal symptoms since our last visit.  Myocardial perfusion stress test was without ischemia or scar.  Pain is likely noncardiac in nature.  No further workup at this time.  Continue primary prevention.  Palpitations Episodes occurring almost daily, most commonly at night.  No worrisome symptoms accompanied the palpitations.  Event monitor is still in place.  I think it is reasonable to discontinue the event monitor at this time.  We will increase carvedilol to 25 mg twice daily to see if this provides any symptomatic relief.  We will follow-up with her after review of the event monitor tracings.  Essential hypertension Blood pressure mildly elevated today.  As above, will increase carvedilol to 25 mg twice daily.  Hyperlipidemia Most recent LDL was 93.  To new current dose of rosuvastatin, given history of diabetes mellitus.  Type 2 diabetes mellitus Poorly controlled, with hemoglobin A1c of 8.0 earlier this month.  Continue management per Dr. Burr Medico.  Follow-up: Return to clinic in 3 months.  Nelva Bush, MD 09/27/2017 9:10 AM

## 2017-09-27 NOTE — Patient Instructions (Addendum)
Medication Instructions:  START Carvedilol 25 mg by mouth daily, take 2 tablets of the 12.5 mg until gone..  -- If you need a refill on your cardiac medications before your next appointment, please call your pharmacy. --  Labwork: None ordered  Testing/Procedures: None ordered  Follow-Up: Your physician wants you to follow-up in: 6 WEEKS with APP.    Thank you for choosing [facility]!!    Any Other Special Instructions Will Be Listed Below (If Applicable).

## 2017-09-29 ENCOUNTER — Other Ambulatory Visit: Payer: Self-pay | Admitting: Student

## 2017-09-29 DIAGNOSIS — E7800 Pure hypercholesterolemia, unspecified: Secondary | ICD-10-CM

## 2017-10-11 DIAGNOSIS — E113513 Type 2 diabetes mellitus with proliferative diabetic retinopathy with macular edema, bilateral: Secondary | ICD-10-CM | POA: Diagnosis not present

## 2017-10-11 DIAGNOSIS — H35371 Puckering of macula, right eye: Secondary | ICD-10-CM | POA: Diagnosis not present

## 2017-10-11 DIAGNOSIS — H35363 Drusen (degenerative) of macula, bilateral: Secondary | ICD-10-CM | POA: Diagnosis not present

## 2017-10-11 DIAGNOSIS — H4312 Vitreous hemorrhage, left eye: Secondary | ICD-10-CM | POA: Diagnosis not present

## 2017-10-15 ENCOUNTER — Ambulatory Visit (INDEPENDENT_AMBULATORY_CARE_PROVIDER_SITE_OTHER): Payer: Medicare Other | Admitting: Student

## 2017-10-15 ENCOUNTER — Other Ambulatory Visit: Payer: Self-pay

## 2017-10-15 ENCOUNTER — Encounter: Payer: Self-pay | Admitting: Student

## 2017-10-15 VITALS — BP 146/72 | HR 87 | Temp 97.8°F | Ht 64.0 in | Wt 162.4 lb

## 2017-10-15 DIAGNOSIS — R5383 Other fatigue: Secondary | ICD-10-CM | POA: Diagnosis not present

## 2017-10-15 DIAGNOSIS — M791 Myalgia, unspecified site: Secondary | ICD-10-CM | POA: Diagnosis not present

## 2017-10-15 DIAGNOSIS — E559 Vitamin D deficiency, unspecified: Secondary | ICD-10-CM

## 2017-10-15 DIAGNOSIS — M545 Low back pain, unspecified: Secondary | ICD-10-CM

## 2017-10-15 LAB — POCT SEDIMENTATION RATE: POCT SED RATE: 20 mm/hr (ref 0–22)

## 2017-10-15 NOTE — Progress Notes (Signed)
Subjective:    Alicia [name] is a 62 y.o. old female here for generalized body pain.  Patient is here with her sign language interpreter by the name Sherry Ruffing.  HPI Body ache: this has been going on for two months. Pain worse over lower back. She describes the pain as aching.  No radiation to her legs. Tenderness with mild bump or pressure all over. Reports bruising easily.  Pain worse with movement, and after getting up after long rest. Pain is constant. Denies numbness or tingling.  Denies fever, chills, excessive night sweats, appetite change, unintentional weight loss, pain or swelling of the small joints and lymphadenopathy. Reports excessive fatigue. Denies depressed mood. She continues to enjoy being around her grand children. Denies lifting heavy weight. Patient had a limited rheumatologic workup including rheumatoid factor and anti-CCP ab about 3 years ago which were negative. Recent CMP about 4 months ago with mild hypercalcemia to 10.6.   PMH/Problem List: has Diabetic polyneuropathy ([place]); Type 2 diabetes mellitus without complication, without long-term current use of insulin ([place]); HYPERCHOLESTEROLEMIA; HEARING LOSS NOS OR DEAFNESS; Essential hypertension; Asthma; ARTHRITIS, KNEE; Diabetic retinopathy ([place]); Fatigue; Myalgia; Carpal tunnel syndrome; Seizure ([place]); New onset of headaches after age 57; Post-viral cough syndrome; Low back pain without sciatica; Atypical chest pain; Palpitations; Hyperlipidemia; and Constipation on their problem list.   has a past medical history of Anemia, Complete deafness, Deaf, Diabetes mellitus, Diabetic neuropathy ([place]), Ganglion cyst (06/22/2011), Gastroparesis, GERD (gastroesophageal reflux disease), H/O: C-section, Hyperlipidemia, Hypertension, Neuromuscular disorder ([place]), and S/P appy.  FH:  Family History  Problem Relation Age of Onset  . Hypertension Mother   . Heart attack Mother 49  . Diabetes Father   . Cancer Maternal Aunt   . Cancer  Maternal Grandmother     SH Social History   Tobacco Use  . Smoking status: Never Smoker  . Smokeless tobacco: Never Used  Substance Use Topics  . Alcohol use: Yes    Alcohol/week: 1.8 oz    Types: 3 Glasses of wine per week  . Drug use: No    Review of Systems Review of systems negative except for pertinent positives and negatives in history of present illness above.     Objective:     Vitals:   10/15/17 1031  BP: (!) 146/72  Pulse: 87  Temp: 97.8 F (36.6 C)  TempSrc: Oral  SpO2: 95%  Weight: 162 lb 6.4 oz (73.7 kg)  Height: '5\' 4"'$  (1.626 m)   Body mass index is 27.88 kg/m.  Physical Exam  GEN: appears well & comfortable. No apparent distress. Head: normocephalic and atraumatic  Eyes: conjunctiva without injection. Sclera anicteric. HEM: negative for cervical or periauricular lymphadenopathies CVS: RRR, nl s1 & s2, no murmurs, no edema RESP: no IWOB, good air movement bilaterally, CTAB GI: BS present & normal, soft, NTND, no guarding, no rebound, no palpable mass MSK: Tenderness to palpation across the lower back bilaterally.  More tenderness over paraspinal muscles done spinous process.  No tenderness in extremities or upper back. Neurovascularly intact in both extremities. SKIN: no apparent skin lesion NEURO: alert and oiented appropriately, no gross deficits   PSYCH: euthymic mood with congruent affect    Assessment and Plan:  1. Myalgia: Unclear etiology.  Pain mainly over her lower back suggestive for lumbar paraspinous muscle strain from possible underlying degenerative change.  No tenderness to palpation elsewhere.  No small joint swelling or tenderness. She has no constitutional symptoms other than fatigue which seems to be a  chronic issue.  Denies depressed mood or anhedonia.  She had limited rheumatologic exams which were negative for rheumatoid factor and anti-CCP antibody about 3 years ago.  No red flags to suggest infectious etiology, malignancy or  fracture.  History of statin intolerance in the past.  She was switched from Lipitor to Crestor.  She is currently on Crestor 5 mg daily.  Also history of mild hypercalcemia about 3 months ago.  - CMP14+EGFR - CBC With Differential - TSH - PTH, Intact and Calcium - POCT SEDIMENTATION RATE - C-reactive protein - CK  2. Bilateral low back pain without sciatica, unspecified chronicity: Likely due to some lumbar paraspinal muscle strain from possible underlying degenerative change.  Exam with tenderness to palpation across the lower back, more over paraspinal muscles.  Neurovascular intact lower extremities.  No red flags to suggest infectious etiology, malignancy or fracture.  3. Fatigue, unspecified type: Chronic issue.  Unclear etiology.  Denies depressed mood or anhedonia.  Still enjoys taking care of her grandchildren.  She has vibrant affect. - CMP14+EGFR - CBC With Differential - TSH - PTH, Intact and Calcium - POCT SEDIMENTATION RATE - C-reactive protein - CK  Return in about 2 weeks (around 10/29/2017) for Diabetes and discuss labs.  Mercy Riding, MD 10/15/17 Pager: [phone]

## 2017-10-15 NOTE — Patient Instructions (Signed)
It was great seeing you today! We have addressed the following issues today  Body aches/back pain: It is unclear what could be causing your pain.  The back pain could be due to arthritis or back muscles strain.  I recommend trying the exercise regimen below.  I also recommend daily walking for 30-40 minutes and taking extra strength Tylenol every 6 hours.  We have also done some blood work to exclude other causes.   If we did any lab work today, and the results require attention, either me or my nurse will get in touch with you. If everything is normal, you will get a letter in mail and a message via . If you don't hear from [name] in two weeks, please give [name] a call. Otherwise, we look forward to seeing you again at your next visit. If you have any questions or concerns before then, please call the clinic at [phone].  Please bring all your medications to every doctors visit  Sign up for My Chart to have easy access to your labs results, and communication with your Primary care physician.    Please check-out at the front desk before leaving the clinic.    Take Care,   Dr. Cyndia Skeeters   Back Exercises If you have pain in your back, do these exercises 2-3 times each day or as told by your doctor. When the pain goes away, do the exercises once each day, but repeat the steps more times for each exercise (do more repetitions). If you do not have pain in your back, do these exercises once each day or as told by your doctor. Exercises Single Knee to Chest  Do these steps 3-5 times in a row for each leg: 1. Lie on your back on a firm bed or the floor with your legs stretched out. 2. Bring one knee to your chest. 3. Hold your knee to your chest by grabbing your knee or thigh. 4. Pull on your knee until you feel a gentle stretch in your lower back. 5. Keep doing the stretch for 10-30 seconds. 6. Slowly let go of your leg and straighten it.  Pelvic Tilt  Do these steps 5-10 times in a  row: 1. Lie on your back on a firm bed or the floor with your legs stretched out. 2. Bend your knees so they point up to the ceiling. Your feet should be flat on the floor. 3. Tighten your lower belly (abdomen) muscles to press your lower back against the floor. This will make your tailbone point up to the ceiling instead of pointing down to your feet or the floor. 4. Stay in this position for 5-10 seconds while you gently tighten your muscles and breathe evenly.  Cat-Cow  Do these steps until your lower back bends more easily: 1. Get on your hands and knees on a firm surface. Keep your hands under your shoulders, and keep your knees under your hips. You may put padding under your knees. 2. Let your head hang down, and make your tailbone point down to the floor so your lower back is round like the back of a cat. 3. Stay in this position for 5 seconds. 4. Slowly lift your head and make your tailbone point up to the ceiling so your back hangs low (sags) like the back of a cow. 5. Stay in this position for 5 seconds.  Press-Ups  Do these steps 5-10 times in a row: 1. Lie on your belly (face-down) on the  floor. 2. Place your hands near your head, about shoulder-width apart. 3. While you keep your back relaxed and keep your hips on the floor, slowly straighten your arms to raise the top half of your body and lift your shoulders. Do not use your back muscles. To make yourself more comfortable, you may change where you place your hands. 4. Stay in this position for 5 seconds. 5. Slowly return to lying flat on the floor.  Bridges  Do these steps 10 times in a row: 1. Lie on your back on a firm surface. 2. Bend your knees so they point up to the ceiling. Your feet should be flat on the floor. 3. Tighten your butt muscles and lift your butt off of the floor until your waist is almost as high as your knees. If you do not feel the muscles working in your butt and the back of your thighs, slide your  feet 1-2 inches farther away from your butt. 4. Stay in this position for 3-5 seconds. 5. Slowly lower your butt to the floor, and let your butt muscles relax.  If this exercise is too easy, try doing it with your arms crossed over your chest. Belly Crunches  Do these steps 5-10 times in a row: 1. Lie on your back on a firm bed or the floor with your legs stretched out. 2. Bend your knees so they point up to the ceiling. Your feet should be flat on the floor. 3. Cross your arms over your chest. 4. Tip your chin a little bit toward your chest but do not bend your neck. 5. Tighten your belly muscles and slowly raise your chest just enough to lift your shoulder blades a tiny bit off of the floor. 6. Slowly lower your chest and your head to the floor.  Back Lifts Do these steps 5-10 times in a row: 1. Lie on your belly (face-down) with your arms at your sides, and rest your forehead on the floor. 2. Tighten the muscles in your legs and your butt. 3. Slowly lift your chest off of the floor while you keep your hips on the floor. Keep the back of your head in line with the curve in your back. Look at the floor while you do this. 4. Stay in this position for 3-5 seconds. 5. Slowly lower your chest and your face to the floor.  Contact a doctor if:  Your back pain gets a lot worse when you do an exercise.  Your back pain does not lessen 2 hours after you exercise. If you have any of these problems, stop doing the exercises. Do not do them again unless your doctor says it is okay. Get help right away if:  You have sudden, very bad back pain. If this happens, stop doing the exercises. Do not do them again unless your doctor says it is okay. This information is not intended to replace advice given to you by your health care provider. Make sure you discuss any questions you have with your health care provider. Document Released: 07/22/2010 Document Revised: 11/25/2015 Document Reviewed:  08/13/2014 Elsevier Interactive Patient Education  [employer].

## 2017-10-16 LAB — CBC WITH DIFFERENTIAL
Basophils Absolute: 0 10*3/uL (ref 0.0–0.2)
Basos: 0 %
EOS (ABSOLUTE): 0.2 10*3/uL (ref 0.0–0.4)
Eos: 3 %
Hematocrit: 29.2 % — ABNORMAL LOW (ref 34.0–46.6)
Hemoglobin: 9 g/dL — ABNORMAL LOW (ref 11.1–15.9)
Immature Grans (Abs): 0 10*3/uL (ref 0.0–0.1)
Immature Granulocytes: 0 %
Lymphocytes Absolute: 1.8 10*3/uL (ref 0.7–3.1)
Lymphs: 32 %
MCH: 24.5 pg — ABNORMAL LOW (ref 26.6–33.0)
MCHC: 30.8 g/dL — ABNORMAL LOW (ref 31.5–35.7)
MCV: 80 fL (ref 79–97)
Monocytes Absolute: 0.4 10*3/uL (ref 0.1–0.9)
Monocytes: 7 %
Neutrophils Absolute: 3.2 10*3/uL (ref 1.4–7.0)
Neutrophils: 58 %
RBC: 3.67 x10E6/uL — ABNORMAL LOW (ref 3.77–5.28)
RDW: 17.1 % — ABNORMAL HIGH (ref 12.3–15.4)
WBC: 5.6 10*3/uL (ref 3.4–10.8)

## 2017-10-16 LAB — CMP14+EGFR
ALT: 10 IU/L (ref 0–32)
AST: 13 IU/L (ref 0–40)
Albumin/Globulin Ratio: 1.3 (ref 1.2–2.2)
Albumin: 4.2 g/dL (ref 3.6–4.8)
Alkaline Phosphatase: 76 IU/L (ref 39–117)
BUN/Creatinine Ratio: 17 (ref 12–28)
BUN: 15 mg/dL (ref 8–27)
Bilirubin Total: <0.2 mg/dL (ref 0.0–1.2)
CO2: 25 mmol/L (ref 20–29)
Calcium: 10.6 mg/dL — ABNORMAL HIGH (ref 8.7–10.3)
Chloride: 98 mmol/L (ref 96–106)
Creatinine, Ser: 0.89 mg/dL (ref 0.57–1.00)
GFR calc Af Amer: 81 mL/min/{1.73_m2} (ref >59)
GFR calc non Af Amer: 70 mL/min/{1.73_m2} (ref >59)
Globulin, Total: 3.3 g/dL (ref 1.5–4.5)
Glucose: 295 mg/dL — ABNORMAL HIGH (ref 65–99)
Potassium: 4.5 mmol/L (ref 3.5–5.2)
Sodium: 139 mmol/L (ref 134–144)
Total Protein: 7.5 g/dL (ref 6.0–8.5)

## 2017-10-16 LAB — PTH, INTACT AND CALCIUM: PTH: 55 pg/mL (ref 15–65)

## 2017-10-16 LAB — TSH: TSH: 1.64 u[IU]/mL (ref 0.450–4.500)

## 2017-10-16 LAB — C-REACTIVE PROTEIN: CRP: 2.3 mg/L (ref 0.0–4.9)

## 2017-10-16 LAB — VITAMIN D 25 HYDROXY (VIT D DEFICIENCY, FRACTURES): Vit D, 25-Hydroxy: 44.2 ng/mL (ref 30.0–100.0)

## 2017-10-16 LAB — CK: Total CK: 82 U/L (ref 24–173)

## 2017-10-17 ENCOUNTER — Encounter: Payer: Self-pay | Admitting: Student

## 2017-10-17 ENCOUNTER — Telehealth: Payer: Self-pay | Admitting: Student

## 2017-10-17 NOTE — Telephone Encounter (Signed)
Called patient using sign language interpretor with ID # [id number] to discuss about her lab results which is significant for anemia with Hgb to 9.0. Recommended follow up to discuss about this. Apt scheduled for 10/22/2017 at 10:50 am.  Patient needs sign language interpretor. Will bring this to the attention of front desk office.

## 2017-10-22 ENCOUNTER — Other Ambulatory Visit: Payer: Self-pay

## 2017-10-22 ENCOUNTER — Encounter: Payer: Self-pay | Admitting: Student

## 2017-10-22 ENCOUNTER — Ambulatory Visit (INDEPENDENT_AMBULATORY_CARE_PROVIDER_SITE_OTHER): Payer: Medicare Other | Admitting: Student

## 2017-10-22 VITALS — BP 160/70 | HR 79 | Temp 97.9°F | Ht 64.0 in | Wt 167.2 lb

## 2017-10-22 DIAGNOSIS — D649 Anemia, unspecified: Secondary | ICD-10-CM

## 2017-10-22 DIAGNOSIS — M791 Myalgia, unspecified site: Secondary | ICD-10-CM | POA: Diagnosis not present

## 2017-10-22 DIAGNOSIS — I10 Essential (primary) hypertension: Secondary | ICD-10-CM | POA: Diagnosis not present

## 2017-10-22 MED ORDER — IRON 325 (65 FE) MG PO TABS: 1.0000 | ORAL_TABLET | Freq: Two times a day (BID) | ORAL | 0 refills | Status: DC

## 2017-10-22 MED ORDER — HYDROCHLOROTHIAZIDE 25 MG PO TABS: 25.0000 mg | ORAL_TABLET | Freq: Every day | ORAL | 0 refills | Status: DC

## 2017-10-22 NOTE — Progress Notes (Signed)
Subjective:    Alicia [name] is a 62 y.o. old female here for follow-up on anemia. In person sign language interpreter by name Zenia Resides was used for this encounter HPI Anemia: Patient was seen in clinic about a week ago for myalgia.  Blood work at that time significant for hemoglobin of 9.0.  She denies dizziness or lightheadedness.  She denies GI or vaginal bleeding.  Reports history of anemia in the past.  She has a normal colonoscopy about 10 years ago.  He denies constitutional symptoms.  PMH/Problem List: has Diabetic polyneuropathy ([place]); Type 2 diabetes mellitus without complication, without long-term current use of insulin ([place]); HYPERCHOLESTEROLEMIA; HEARING LOSS NOS OR DEAFNESS; Essential hypertension, benign; Asthma; ARTHRITIS, KNEE; Diabetic retinopathy ([place]); Fatigue; Anemia; Myalgia; Carpal tunnel syndrome; Seizure ([place]); New onset of headaches after age 58; Post-viral cough syndrome; Bilateral low back pain without sciatica; Atypical chest pain; Palpitations; Hyperlipidemia; and Constipation on their problem list.   has a past medical history of Anemia, Complete deafness, Deaf, Diabetes mellitus, Diabetic neuropathy ([place]), Ganglion cyst (06/22/2011), Gastroparesis, GERD (gastroesophageal reflux disease), H/O: C-section, Hyperlipidemia, Hypertension, Neuromuscular disorder ([place]), and S/P appy.  FH:  Family History  Problem Relation Age of Onset  . Hypertension Mother   . Heart attack Mother 51  . Diabetes Father   . Cancer Maternal Aunt   . Cancer Maternal Grandmother     SH Social History   Tobacco Use  . Smoking status: Never Smoker  . Smokeless tobacco: Never Used  Substance Use Topics  . Alcohol use: Yes    Alcohol/week: 1.8 oz    Types: 3 Glasses of wine per week  . Drug use: No    Review of Systems Review of systems negative except for pertinent positives and negatives in history of present illness above.     Objective:     Vitals:   10/22/17 1045 10/22/17  1100  BP: (!) 160/68 (!) 160/70  Pulse: 79   Temp: 97.9 F (36.6 C)   TempSrc: Oral   SpO2: 96%   Weight: 167 lb 3.2 oz (75.8 kg)   Height: _0  (1.626 m)    Body mass index is 28.7 kg/m.  Physical Exam  GEN: appears well & comfortable. No apparent distress. Head: normocephalic and atraumatic  Eyes: conjunctiva without injection. Sclera anicteric. HEM: negative for cervical or periauricular lymphadenopathies CVS: RRR, nl s1 & s2, no murmurs, no edema RESP: no IWOB, good air movement bilaterally, CTAB GI: BS present & normal, soft, NTND SKIN: no apparent skin lesion NEURO: alert and oiented appropriately, no gross deficits   PSYCH: euthymic mood with congruent affect    Assessment and Plan:  1. Anemia, unspecified type: Hemoglobin 9.0 about a week ago.  MCV 80.  Appears to have a good bone marrow response with RDW of 17.1.  Unclear etiology of anemia.  She has been on PPI chronically which can contribute.  She denies GI or GU bleed.  Will obtain anemia panel.  Started on ferrous sulfate. - Anemia panel - Ferrous Sulfate (IRON) 325 (65 Fe) MG TABS; Take 1 tablet (325 mg total) by mouth 2 (two) times daily.  Dispense: 180 each; Refill: 0  2. Essential HTN: Noted that blood pressure is 160/70.  Repeat blood pressure the same.  She reports good compliance with her medication.  She took her medication this morning.  She is on meloxicam for chronic myalgia which could contribute to elevated blood pressure.  Will increase her hydrochlorothiazide from 12.5 mg to  25 mg daily.  We will continue her Coreg and losartan.  Discussed about lifestyle change including diet and exercise.  Gave her handout.  She has an upcoming appointment with her PCP next month.  - hydrochlorothiazide (HYDRODIURIL) 25 MG tablet; Take 1 tablet (25 mg total) by mouth daily.  Dispense: 30 tablet; Refill: 0  3. Myalgia: unclear etiology at this time.  ESR, CRP, CK, TSH, CMP and vitamin D level within normal range. I  wonder if this is statin intolerance.  She was a switch from atorvastatin to rosuvastatin in the past due to statin intolerance.  She has an ASCVD risk score of 23% based on current blood pressure. She could be a candidate for PCSK-9 inhibitors.  I recommended discussing this with her PCP when she comes back next month.  Myalgia could also be due to her anemia.  Return if symptoms worsen or fail to improve.  Mercy Riding, MD 10/22/17 Pager: [phone]

## 2017-10-22 NOTE — Patient Instructions (Addendum)
It was great seeing you today! We have addressed the following issues today  Anemia (low blood level): We are checking your iron level today.  We sent a prescription for iron pills to your pharmacy.  Please take this medication twice a day.  Blood pressure: Your blood pressure is 160/70 today.  Your goal blood pressure is less than 130/80.  We increased your hydrochlorothiazide to 25 mg daily.  We also recommend lifestyle change including diet and exercise as below.  Please keep your appointment with Dr. Burr Medico next month   If we did any lab work today, and the results require attention, either me or my nurse will get in touch with you. If everything is normal, you will get a letter in mail and a message via . If you don't hear from [name] in two weeks, please give [name] a call. Otherwise, we look forward to seeing you again at your next visit. If you have any questions or concerns before then, please call the clinic at [phone].  Please bring all your medications to every doctors visit  Sign up for My Chart to have easy access to your labs results, and communication with your Primary care physician.    Please check-out at the front desk before leaving the clinic.    Take Care,   Dr. Cyndia Skeeters  Portion Size   Choose healthier foods such as 100% whole grains, vegetables, fruits, beans, nut seeds, olive oil, most vegetable oils, fat-free dietary, wild game and fish.   Avoid sweet tea, other sweetened beverages, soda, fruit juice, cold cereal and milk and trans fat.   Eat at least 3 meals and 1-2 snacks per day.  Aim for no more than 5 hours between eating.  Eat breakfast within one hour of getting up.    Exercise at least 150 minutes per week, including weight resistance exercises 3 or 4 times per week.   Try to lose at least 7-10% of your current body weight.   Limit your salt (Sodium) intake to less than 2 gm (2000 mg) a day if you have conditions such as  elevated blood pressure,  heart failure...    You may also read about DASH and/or Mediterranean diet at the following web site if you have blood pressure or heart condition. [email or web address].[email or web address].co.za

## 2017-10-23 ENCOUNTER — Encounter: Payer: Self-pay | Admitting: Student

## 2017-10-23 ENCOUNTER — Other Ambulatory Visit: Payer: Self-pay | Admitting: Student

## 2017-10-23 LAB — ANEMIA PANEL
Ferritin: 6 ng/mL — ABNORMAL LOW (ref 15–150)
Folate, Hemolysate: 292.5 ng/mL
Folate, RBC: 1060 ng/mL (ref >498)
Hematocrit: 27.6 % — ABNORMAL LOW (ref 34.0–46.6)
Iron Saturation: 5 % — CL (ref 15–55)
Iron: 17 ug/dL — ABNORMAL LOW (ref 27–139)
Retic Ct Pct: 0.9 % (ref 0.6–2.6)
Total Iron Binding Capacity: 350 ug/dL (ref 250–450)
UIBC: 333 ug/dL (ref 118–369)
Vitamin B-12: >2000 pg/mL — ABNORMAL HIGH (ref 232–1245)

## 2017-10-23 NOTE — Progress Notes (Signed)
Called and talked to patient about her anemia panel and using a sign interpreter with ID number [id number].  Anemia panel significant for iron saturation to 5 and low fertin to 6.  I recommended the following:  The importance of taking iron pills daily  Patient states that she no longer have heartburn but she continues to take Protonix daily.  I recommended stopping the Protonix.  I warned her about possible rebound heartburn after stopping Protonix which should improve on its own. I also sent her some recommendations about lifestyle change including dietary recommendations to reduce her risk of heartburn/GERD  Central list of iron rich foods from [facility]  Of note, patient states drinking red bull daily, and asked if this was why her blood pressure was high yesterday.  I strongly suggested stopping red bull and starting the new dose of hydrochlorothiazide and checking her blood pressure daily.  I advised her to go back to prior dose if her blood pressure is less than 130/80 consistently or if she feels lightheaded or dizzy.  Patient voiced understanding and agrees  Patient has a follow-up with his PCP early next month.

## 2017-11-09 ENCOUNTER — Ambulatory Visit: Payer: Self-pay | Admitting: Student in an Organized Health Care Education/Training Program

## 2017-11-09 ENCOUNTER — Ambulatory Visit (INDEPENDENT_AMBULATORY_CARE_PROVIDER_SITE_OTHER): Payer: Medicare Other | Admitting: Physician Assistant

## 2017-11-09 ENCOUNTER — Encounter: Payer: Self-pay | Admitting: Physician Assistant

## 2017-11-09 VITALS — BP 146/82 | HR 95 | Ht 64.0 in | Wt 165.0 lb

## 2017-11-09 DIAGNOSIS — I10 Essential (primary) hypertension: Secondary | ICD-10-CM

## 2017-11-09 DIAGNOSIS — R1013 Epigastric pain: Secondary | ICD-10-CM | POA: Diagnosis not present

## 2017-11-09 DIAGNOSIS — I471 Supraventricular tachycardia, unspecified: Secondary | ICD-10-CM

## 2017-11-09 HISTORY — DX: Supraventricular tachycardia: I47.1

## 2017-11-09 HISTORY — DX: Supraventricular tachycardia, unspecified: I47.10

## 2017-11-09 NOTE — Progress Notes (Signed)
Cardiology Office Note:    Date:  11/09/2017   ID:  Alicia [name], DOB May 04, 1956, MRN [id number]  PCP:  Everrett Coombe, MD  Cardiologist:  Nelva Bush, MD   Referring MD: Everrett Coombe, MD   Chief Complaint  Patient presents with  . Follow-up    Palpitations    History of Present Illness:    Alicia [name] is a 62 y.o. female with diabetes, hypertension, hyperlipidemia, sleep apnea, deafness.  She was evaluated by Dr. Saunders Revel in December 2019 for chest pain.  Nuclear stress test was negative for ischemia or scar.  She also complained of palpitations.  An event monitor was obtained.  Ms. Persichetti returns for follow-up.  She is seen with the assistance of an interpreter.  She denies any further chest pain.  She denies significant lower extremity swelling, PND.  She denies syncope.  She has had a couple of episodes of palpitations.  Both episodes were related to meals.  Last week her symptoms lasted for about 30 minutes.  Last night, she had spicy chicken.  She had palpitations with this and ultimately vomited.  She felt better after this.  Prior CV studies:   The following studies were reviewed today:  Nuclear stress test 09/14/2017 1. EF 61%, normal wall motion.  2. No evidence for ischemia or infarction by perfusion images.    Event monitor 09/14/2017 Predominantly sinus rhythm with episodes of narrow-complex tachycardia suggestive of paroxysmal supraventricular tachycardia.  Nuclear stress test 01/2009 EF 71, no ischemia  Past Medical History:  Diagnosis Date  . Anemia   . Complete deafness    Meningitis at age 29  . Deaf   . Diabetes mellitus   . Diabetic neuropathy ([place])   . Ganglion cyst 06/22/2011  . Gastroparesis   . GERD (gastroesophageal reflux disease)   . H/O: C-section   . Hyperlipidemia   . Hypertension   . Neuromuscular disorder ([place])    diabetic neuropathy  . PSVT (paroxysmal supraventricular tachycardia) ([place]) 11/09/2017   Event monitor 09/14/2017 -  Predominantly sinus rhythm with episodes of narrow-complex tachycardia suggestive of paroxysmal supraventricular tachycardia.  . S/P appy    Surgical Hx: The patient  has a past surgical history that includes Appendectomy; Tubal ligation; Cesarean section; Uterine fibroid embolization (2007); cardiolyte EF 77%, no ischemia in 2006 (2006); Wrist surgery; Wrist surgery (Right, 1985); Carpal tunnel release (Right, 04/20/2015); Ulnar nerve transposition (Right, 04/20/2015); Trigger finger release (Right, 04/20/2015); Open reduction internal fixation (orif) distal radial fracture (Left, 11/04/2015); and Carpal tunnel release (Left, 11/04/2015).   Current Medications: Current Meds  Medication Sig  . ACCU-CHEK SOFTCLIX LANCETS lancets TEST 4 TIMES A DAY  . acetaminophen-codeine (TYLENOL #3) 300-30 MG tablet Take 1 tablet by mouth at bedtime as needed for moderate pain.  Marland Kitchen aspirin (BAYER CHILDRENS ASPIRIN) 81 MG chewable tablet Chew 81 mg by mouth daily.    . benzonatate (TESSALON) 100 MG capsule Take 2 capsules (200 mg total) by mouth 3 (three) times daily as needed for cough.  . Blood Glucose Monitoring Suppl (ACCU-CHEK AVIVA) device Use as instructed  . carvedilol (COREG) 25 MG tablet Take 1 tablet (25 mg total) by mouth 2 (two) times daily.  . cholecalciferol (VITAMIN D) 1000 UNITS tablet Take 1,000 Units by mouth daily.  . cyanocobalamin 500 MCG tablet Take 500 mcg by mouth daily.  . Ferrous Sulfate (IRON) 325 (65 Fe) MG TABS Take 1 tablet (325 mg total) by mouth 2 (two) times daily.  . fluticasone (  FLONASE) 50 MCG/ACT nasal spray Place 1 spray into both nostrils daily as needed.   . gabapentin (NEURONTIN) 400 MG capsule Take 3 capsules (1,200 mg total) by mouth 2 (two) times daily as needed (pain).  Marland Kitchen glucose blood (ACCU-CHEK AVIVA PLUS) test strip CHECK SUGAR 3 TIMES A DAILY.  Marland Kitchen guaiFENesin 200 MG tablet Take 1 tablet (200 mg total) by mouth every 8 (eight) hours as needed for cough or to loosen  phlegm.  . hydrochlorothiazide (HYDRODIURIL) 25 MG tablet Take 1 tablet (25 mg total) by mouth daily.  . Insulin Glargine (BASAGLAR KWIKPEN) 100 UNIT/ML SOPN INJECT 0.08 MLS (8 UNITS TOTAL) INTO THE SKIN DAILY.  Marland Kitchen Insulin Pen Needle 31G X 5 MM MISC 1 Container by Does not apply route once as needed.  Marland Kitchen losartan (COZAAR) 100 MG tablet Take 1 tablet (100 mg total) by mouth daily.  . meloxicam (MOBIC) 15 MG tablet TAKE 1 TABLET BY MOUTH EVERY DAY  . metFORMIN (GLUCOPHAGE) 1000 MG tablet TAKE 1 TABLET BY MOUTH TWICE A DAY  . NOVOLOG 100 UNIT/ML injection INJECT 1-4 UNITS INTO THE SKIN 3 (THREE) TIMES DAILY WITH MEALS.  Marland Kitchen PATADAY 0.2 % SOLN PLACE 1 DROP INTO EACH EYE EVERY DAY AS NEEDED  . polyethylene glycol powder (GLYCOLAX/MIRALAX) powder TAKE 17 GRAMS MIXED IN LIQUID THREE TIMES A WEEK  . polyethylene glycol powder (GLYCOLAX/MIRALAX) powder Take 17 g by mouth 3 (three) times daily as needed.  . rosuvastatin (CRESTOR) 5 MG tablet TAKE 1 TABLET (5 MG TOTAL) BY MOUTH DAILY AT 6 PM.  . senna-docusate (SENOKOT-S) 8.6-50 MG tablet Take 1 tablet by mouth daily as needed for mild constipation or moderate constipation.  Marland Kitchen VICTOZA 18 MG/3ML SOPN INJECT 0.1-0.2 ML (0.6-1.2 MG TOTAL) INTO THE SKIN EVERY DAY     Allergies:   Sulfonamide derivatives; Lipitor [atorvastatin calcium]; Ramipril; and Trulicity [dulaglutide]   Social History   Tobacco Use  . Smoking status: Never Smoker  . Smokeless tobacco: Never Used  Substance Use Topics  . Alcohol use: Yes    Alcohol/week: 1.8 oz    Types: 3 Glasses of wine per week  . Drug use: No     Family Hx: The patient's family history includes Cancer in her maternal aunt and maternal grandmother; Diabetes in her father; Heart attack (age of onset: 57) in her mother; Hypertension in her mother.  ROS:   Please see the history of present illness.    Review of Systems  Musculoskeletal: Positive for back pain and myalgias.  Gastrointestinal: Positive for  constipation, diarrhea and nausea.   All other systems reviewed and are negative.   EKGs/Labs/Other Test Reviewed:    EKG:  EKG is not ordered today.    Recent Labs: 10/15/2017: ALT 10; BUN 15; Creatinine, Ser 0.89; Hemoglobin 9.0; Potassium 4.5; Sodium 139; TSH 1.640   Recent Lipid Panel Lab Results  Component Value Date/Time   CHOL 188 06/20/2017 09:47 AM   TRIG 57 06/20/2017 09:47 AM   HDL 84 06/20/2017 09:47 AM   CHOLHDL 2.2 06/20/2017 09:47 AM   CHOLHDL 2.3 07/28/2016 09:36 AM   LDLCALC 93 06/20/2017 09:47 AM   LDLDIRECT 78 07/10/2013 02:41 PM    Physical Exam:    VS:  BP (!) 146/82   Pulse 95   Ht 5\' 4"  (1.626 m)   Wt 165 lb (74.8 kg)   SpO2 96%   BMI 28.32 kg/m     Wt Readings from Last 3 Encounters:  11/09/17 165 lb (  74.8 kg)  10/22/17 167 lb 3.2 oz (75.8 kg)  10/15/17 162 lb 6.4 oz (73.7 kg)     Physical Exam  Constitutional: She is oriented to person, place, and time. She appears well-developed and well-nourished. No distress.  HENT:  Head: Normocephalic and atraumatic.  Neck: Neck supple. No JVD present.  Cardiovascular: Normal rate, regular rhythm, S1 normal, S2 normal and normal heart sounds.  No murmur heard. Pulmonary/Chest: Effort normal. She has no rales.  Abdominal: Soft. There is no hepatomegaly.  Musculoskeletal: She exhibits no edema.  Neurological: She is alert and oriented to person, place, and time.  Skin: Skin is warm and dry.    ASSESSMENT & PLAN:    PSVT (paroxysmal supraventricular tachycardia) ([place]) She has evidence of narrow complex tachycardia consistent with PSVT.  I discussed the physiology and management of this with her today.  She is currently on maximum dose beta-blocker.  She has had a couple of episodes of palpitations related to meals.  I am not certain how this is related.  We discussed taking an antiacid to see how she does versus referral to EP.  She would like to try the antacid first.  If she continues to have  palpitations or they worsen, she will need referral to EP for possible ablation.  Continue coreg 25 mg Twice daily.  Essential hypertension, benign Borderline control.  She will continue to monitor at home.  Her pressures at home have recently been 120s over 80s.  She will contact us if her blood pressure remains above target.  Dyspepsia As noted, she has had some issues with indigestion and palpitations after meals.  I have asked her to try Pepcid 20 mg twice daily for 2 weeks.   Dispo:  Return in about 6 months (around 05/12/2018) for Routine Follow Up, w/ Dr. Saunders Revel, or Richardson Dopp, PA-C.   Medication Adjustments/Labs and Tests Ordered: Current medicines are reviewed at length with the patient today.  Concerns regarding medicines are outlined above.  Tests Ordered: No orders of the defined types were placed in this encounter.  Medication Changes: No orders of the defined types were placed in this encounter.   Signed, Richardson Dopp, PA-C  11/09/2017 9:59 AM    [facility] [place], [place], [place]  [address] Phone: [phone]; Fax: [phone]

## 2017-11-09 NOTE — Patient Instructions (Signed)
Medication Instructions:  1. TRY OTC PEPCID 20 MG TWICE DAILY FOR 2 WEEKS   2. NO OTHER CHANGES HAVE BEEN MADE WITHY YOUR MEDICATIONS TODAY  Labwork: NONE ORDERED TODAY  Testing/Procedures: NONE ORDERED TODAY  Follow-Up: Your physician wants you to follow-up in: [place] DR. END Cruzita Lederer You will receive a reminder letter in the mail two months in advance. If you don't receive a letter, please call our office to schedule the follow-up appointment.   Any Other Special Instructions Will Be Listed Below (If Applicable). CALL IF YOUR PALPITATIONS CONTINUE OR INCREASE  CALL IF YOUR BLOOD PRESSURE IS CONSISTENTLY RUNNING 130/80 [phone]    If you need a refill on your cardiac medications before your next appointment, please call your pharmacy.

## 2017-11-10 ENCOUNTER — Other Ambulatory Visit: Payer: Self-pay | Admitting: Student in an Organized Health Care Education/Training Program

## 2017-11-12 DIAGNOSIS — H3582 Retinal ischemia: Secondary | ICD-10-CM | POA: Diagnosis not present

## 2017-11-12 DIAGNOSIS — E113513 Type 2 diabetes mellitus with proliferative diabetic retinopathy with macular edema, bilateral: Secondary | ICD-10-CM | POA: Diagnosis not present

## 2017-11-12 DIAGNOSIS — H35371 Puckering of macula, right eye: Secondary | ICD-10-CM | POA: Diagnosis not present

## 2017-11-12 DIAGNOSIS — H35363 Drusen (degenerative) of macula, bilateral: Secondary | ICD-10-CM | POA: Diagnosis not present

## 2017-11-14 ENCOUNTER — Telehealth: Payer: Self-pay | Admitting: Pulmonary Disease

## 2017-11-14 NOTE — Telephone Encounter (Signed)
Called and spoke to [place] with [employer]. [name] stated that pt did not wish to proceed with cpap back in November when order was placed.  [name] stated that pt called back in to [facility] 11/05/17 and wished to proceed with cpap. [name] stated that a more recent office note is needed prior to proceeding with cpap therapy.   lmtcb x1 for pt via interpreter.

## 2017-11-16 ENCOUNTER — Ambulatory Visit: Payer: Medicare Other | Admitting: Student in an Organized Health Care Education/Training Program

## 2017-11-16 NOTE — Telephone Encounter (Signed)
LM with interpreter to advise pt to call back. Will await return call.

## 2017-11-19 NOTE — Telephone Encounter (Signed)
Pt is calling back [phone] pt will be there until 11:30

## 2017-11-19 NOTE — Telephone Encounter (Signed)
Patient called back - scheduled patient with TP on 11/27/17, nothing further needed -pr

## 2017-11-19 NOTE — Telephone Encounter (Signed)
ATC pt, no answer. Left message for pt to call back through interpreter.

## 2017-11-19 NOTE — Telephone Encounter (Signed)
She just needs an appt to discuss CPAP.

## 2017-11-19 NOTE — Telephone Encounter (Signed)
lmtcb X2 for pt via interpreter.

## 2017-11-23 ENCOUNTER — Other Ambulatory Visit: Payer: Self-pay

## 2017-11-23 ENCOUNTER — Ambulatory Visit (INDEPENDENT_AMBULATORY_CARE_PROVIDER_SITE_OTHER): Payer: Medicare Other | Admitting: Student in an Organized Health Care Education/Training Program

## 2017-11-23 ENCOUNTER — Encounter: Payer: Self-pay | Admitting: Student in an Organized Health Care Education/Training Program

## 2017-11-23 DIAGNOSIS — M545 Low back pain, unspecified: Secondary | ICD-10-CM

## 2017-11-23 MED ORDER — MELOXICAM 15 MG PO TABS: 15.0000 mg | ORAL_TABLET | Freq: Every day | ORAL | 0 refills | Status: DC

## 2017-11-23 NOTE — Progress Notes (Signed)
Subjective:    Alicia [name] - 62 y.o. female MRN [id number]  Date of birth: January 04, 1956  HPI  Alicia [name] is here for chronic lumbar/sacral back pain.   Back Pain Patient reports that lower non-radiating back pain has persisted for the past 3-4 months. 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 She reports that lumbar pain is worse after work. She reports pain is worse with walking down stairs and she additionally endorses groin pain bilaterally. She has tried taking aleve and tylenol without palliation. Heat has helped her pain. She reports that she was previously given exercises for her back which she feels did not improve her pain.  She denies numbness/paresthesias/weakness. She has not had fecal or urinary incontinence. No IVDA. No fevers or neck stiffness. No chronic steroids, cancer, or immunosuppression.  -  reports that she has never smoked. She has never used smokeless tobacco. - Review of Systems: Per HPI. - Past Medical History: Patient Active Problem List   Diagnosis Date Noted  . PSVT (paroxysmal supraventricular tachycardia) ([place]) 11/09/2017  . Hyperlipidemia 08/30/2017  . Bilateral low back pain without sciatica 03/28/2017  . New onset of headaches after age 74 12/08/2014  . Seizure ([place]) 06/08/2014  . Carpal tunnel syndrome 01/05/2014  . Iron deficiency anemia 07/01/2012  . Diabetic retinopathy ([place]) 07/21/2011  . Asthma 04/04/2010  . ARTHRITIS, KNEE 09/17/2007  . Diabetic polyneuropathy ([place]) 08/30/2006  . Type 2 diabetes mellitus without complication, without long-term current use of insulin ([place]) 08/30/2006  . HYPERCHOLESTEROLEMIA 08/30/2006  . HEARING LOSS NOS OR DEAFNESS 08/30/2006  . Essential hypertension, benign 08/30/2006   - Medications: reviewed and updated Current Outpatient Medications  Medication Sig Dispense Refill  . ACCU-CHEK SOFTCLIX LANCETS lancets TEST 4 TIMES A DAY 200 each 0  . acetaminophen-codeine (TYLENOL #3) 300-30 MG tablet  Take 1 tablet by mouth at bedtime as needed for moderate pain. 45 tablet 0  . aspirin (BAYER CHILDRENS ASPIRIN) 81 MG chewable tablet Chew 81 mg by mouth daily.      . benzonatate (TESSALON) 100 MG capsule Take 2 capsules (200 mg total) by mouth 3 (three) times daily as needed for cough. 20 capsule 0  . Blood Glucose Monitoring Suppl (ACCU-CHEK AVIVA) device Use as instructed 1 each 0  . carvedilol (COREG) 25 MG tablet Take 1 tablet (25 mg total) by mouth 2 (two) times daily. 180 tablet 3  . cholecalciferol (VITAMIN D) 1000 UNITS tablet Take 1,000 Units by mouth daily.    . cyanocobalamin 500 MCG tablet Take 500 mcg by mouth daily.    . Ferrous Sulfate (IRON) 325 (65 Fe) MG TABS Take 1 tablet (325 mg total) by mouth 2 (two) times daily. 180 each 0  . fluticasone (FLONASE) 50 MCG/ACT nasal spray Place 1 spray into both nostrils daily as needed.     . gabapentin (NEURONTIN) 400 MG capsule Take 3 capsules (1,200 mg total) by mouth 2 (two) times daily as needed (pain). 180 capsule 3  . glucose blood (ACCU-CHEK AVIVA PLUS) test strip CHECK SUGAR 3 TIMES A DAILY. 100 each 12  . guaiFENesin 200 MG tablet Take 1 tablet (200 mg total) by mouth every 8 (eight) hours as needed for cough or to loosen phlegm. 30 tablet 0  . hydrochlorothiazide (HYDRODIURIL) 25 MG tablet Take 1 tablet (25 mg total) by mouth daily. 30 tablet 0  . Insulin Glargine (BASAGLAR KWIKPEN) 100 UNIT/ML SOPN INJECT 0.08 MLS (8 UNITS TOTAL) INTO THE SKIN  DAILY. 15 pen 3  . Insulin Pen Needle 31G X 5 MM MISC 1 Container by Does not apply route once as needed. 100 each 11  . losartan (COZAAR) 100 MG tablet TAKE 1 TABLET BY MOUTH EVERY DAY 90 tablet 3  . meloxicam (MOBIC) 15 MG tablet Take 1 tablet (15 mg total) by mouth daily. 30 tablet 0  . metFORMIN (GLUCOPHAGE) 1000 MG tablet TAKE 1 TABLET BY MOUTH TWICE A DAY 60 tablet 11  . NOVOLOG 100 UNIT/ML injection INJECT 1-4 UNITS INTO THE SKIN 3 (THREE) TIMES DAILY WITH MEALS. 10 mL 2  . PATADAY  0.2 % SOLN PLACE 1 DROP INTO EACH EYE EVERY DAY AS NEEDED  12  . polyethylene glycol powder (GLYCOLAX/MIRALAX) powder TAKE 17 GRAMS MIXED IN LIQUID THREE TIMES A WEEK 527 g 3  . polyethylene glycol powder (GLYCOLAX/MIRALAX) powder Take 17 g by mouth 3 (three) times daily as needed. 3350 g 1  . rosuvastatin (CRESTOR) 5 MG tablet TAKE 1 TABLET (5 MG TOTAL) BY MOUTH DAILY AT 6 PM. 90 tablet 0  . senna-docusate (SENOKOT-S) 8.6-50 MG tablet Take 1 tablet by mouth daily as needed for mild constipation or moderate constipation. 30 tablet 0  . VICTOZA 18 MG/3ML SOPN INJECT 0.1-0.2 ML (0.6-1.2 MG TOTAL) INTO THE SKIN EVERY DAY 6 pen 3   No current facility-administered medications for this visit.    Review of Systems See HPI     Objective:   Physical Exam BP 128/70 (BP Location: Left Arm)   Pulse 90   Temp 97.7 F (36.5 C) (Oral)   Wt 163 lb 12.8 oz (74.3 kg)   BMI 28.12 kg/m  Gen: NAD, alert, cooperative with exam, well-appearing. Deaf. CV: Regular rate noted Resp: non-labored Abd: SNTND Skin: no rashes, normal turgor  Neuro: no gross deficits.  Psych: good insight, alert and oriented Back Exam:  Inspection: Unremarkable  Palpable tenderness: L5/S1 paraspinal tenderness to palpation. Full ROM: Flexion 45 deg; Extension 45 deg; Side Bending to 45 deg bilaterally; Rotation to 45 deg bilaterally  Leg strength: Quad: 5/5 Hamstring: 5/5 Hip flexor: 5/5 Hip abductors: 5/5  Sensory change: Gross sensation intact to all lumbar and sacral dermatomes.  Reflexes: 2+ at both patellar tendons. Gait unremarkable. SLR sitting: Negative  (patient unable to tolerate lying due to back pain) . Hip, bilateral: No TTP. +pain with internal rotation of the LEFT leg at the hip on the right. Standing hip rotation and gait withoutunsteadiness.     Assessment & Plan:   Bilateral low back pain without sciatica Patient had mild arthritis L4-L5 on 4/26 XR. Pain seems to be lower than that today at L5-S1. Seems  most consistent with an arthritis flare. Due to chronicity of back pain and failed conservative management previously, we can go ahead with imaging of the spine with XR today. Will also check XR of bilateral hips due to bilateral groin pain and pain with internal rotation of left hip, as osteoarthritis at the hips may be contributory.  - DG Lumbar Spine Complete; Future - DG HIPS BILAT WITH PELVIS 3-4 VIEWS; Future - meloxicam (MOBIC) 15 MG tablet; Take 1 tablet (15 mg total) by mouth daily.  Dispense: 30 tablet; Refill: 0 (advised patient to take with food) - will hold off on muscle relaxer as this made patient too sleepy previously per her report - follow up in 4 weeks or sooner based on XR results - next steps: Consider whether further imaging (MRI) and potential referral to  PMR for facet joint injections may be appropriate based on imaging, vs. Referral to orthopedics if imaging shows significant hip osteoarthritis   Orders Placed This Encounter  Procedures  . DG Lumbar Spine Complete    Standing Status:   Future    Standing Expiration Date:   01/24/2019    Order Specific Question:   Reason for Exam (SYMPTOM  OR DIAGNOSIS REQUIRED)    Answer:   lumbar back pain    Order Specific Question:   Preferred imaging location?    Answer:   [facility]    Order Specific Question:   Radiology Contrast Protocol - do NOT remove file path    Answer:   \\[email or web address].[email or web address]  . DG HIPS BILAT WITH PELVIS 3-4 VIEWS    Standing Status:   Future    Standing Expiration Date:   01/24/2019    Order Specific Question:   Reason for Exam (SYMPTOM  OR DIAGNOSIS REQUIRED)    Answer:   groin pain    Order Specific Question:   Preferred imaging location?    Answer:   [facility]    Order Specific Question:   Radiology Contrast Protocol - do NOT remove file path    Answer:   \\[email or web address].[email or web address]    Meds ordered this  encounter  Medications  . meloxicam (MOBIC) 15 MG tablet    Sig: Take 1 tablet (15 mg total) by mouth daily.    Dispense:  30 tablet    Refill:  0   Everrett Coombe, MD,MS,  PGY2 11/24/2017 4:56 PM

## 2017-11-23 NOTE — Patient Instructions (Signed)
It was a pleasure seeing you today in our clinic. Here is the treatment plan we have discussed and agreed upon together:  Please go to get Xrays of your back and hips. I will call or send you a letter with these results. If you do not hear from me within the next week, please give our office a call.  I think it would be reasonable to try going to the chiropractor for your back pain.  Our clinic's number is [phone]. Please call with questions or concerns about what we discussed today.  Be well, Dr. Burr Medico

## 2017-11-24 NOTE — Assessment & Plan Note (Signed)
Patient had mild arthritis L4-L5 on 4/26 XR. Pain seems to be lower than that today at L5-S1. Seems most consistent with an arthritis flare. Due to chronicity of back pain and failed conservative management previously, we can go ahead with imaging of the spine with XR today. Will also check XR of bilateral hips due to bilateral groin pain and pain with internal rotation of left hip, as osteoarthritis at the hips may be contributory.  - DG Lumbar Spine Complete; Future - DG HIPS BILAT WITH PELVIS 3-4 VIEWS; Future - meloxicam (MOBIC) 15 MG tablet; Take 1 tablet (15 mg total) by mouth daily.  Dispense: 30 tablet; Refill: 0 (advised patient to take with food) - will hold off on muscle relaxer as this made patient too sleepy previously per her report - follow up in 4 weeks or sooner based on XR results - next steps: Consider whether further imaging (MRI) and potential referral to PMR for facet joint injections may be appropriate based on imaging, vs. Referral to orthopedics if imaging shows significant hip osteoarthritis

## 2017-11-27 ENCOUNTER — Encounter: Payer: Self-pay | Admitting: Adult Health

## 2017-11-27 ENCOUNTER — Ambulatory Visit (INDEPENDENT_AMBULATORY_CARE_PROVIDER_SITE_OTHER): Payer: Medicare Other | Admitting: Adult Health

## 2017-11-27 DIAGNOSIS — G4733 Obstructive sleep apnea (adult) (pediatric): Secondary | ICD-10-CM | POA: Diagnosis not present

## 2017-11-27 NOTE — Progress Notes (Signed)
@Patient  ID: Alicia [name], female    DOB: [birth year], 62 y.o.   MRN: [id number]  Chief Complaint  Patient presents with  . Follow-up    OSA     Referring provider: Everrett Coombe, MD  HPI: 62 year old female followed for obstructive sleep apnea Past medical history significant for deafness, diabetes,  Sleep tests Sleep study 08/23/16 >> AHI 6.6, SpO2 low 78%.  11/27/2017 Follow up : OSA (accompanied by sign language interpreter) Patient presents for a follow-up for sleep apnea.  She had a sleep study last year that showed mild sleep apnea with significant nocturnal hypoxemia.  Patient has difficulty sleeping, snoring and daytime sleepiness.  CPAP was ordered last visit unfortunately patient did not get started on that and would like to get it started now. We discussed beginning CPAP went over patient education for CPAP and underlying sleep apnea.   Allergies  Allergen Reactions  . Sulfonamide Derivatives Swelling and Rash    REACTION: rash, swelling - "Lost BABY" - Terrible itching.   . Lipitor [Atorvastatin Calcium] Other (See Comments)    Muscle Aches - Mild-Moderate - completely resolved with D/C of atorva.   . Ramipril Other (See Comments)    REACTION: cough  . Trulicity [Dulaglutide] Nausea And Vomiting    Immunization History  Administered Date(s) Administered  . Influenza Split 06/22/2011, 03/29/2012  . Influenza Whole 06/14/2007, 07/16/2009, 05/13/2010  . Influenza,inj,Quad PF,6+ Mos 04/01/2013, 04/15/2014, 03/23/2015, 03/07/2016, 05/02/2017  . Pneumococcal Polysaccharide-23 04/03/2003, 08/23/2012  . Td 12/06/2003  . Tdap 09/04/2014    Past Medical History:  Diagnosis Date  . Anemia   . Complete deafness    Meningitis at age 89  . Deaf   . Diabetes mellitus   . Diabetic neuropathy ([place])   . Ganglion cyst 06/22/2011  . Gastroparesis   . GERD (gastroesophageal reflux disease)   . H/O: C-section   . Hyperlipidemia   . Hypertension   . Neuromuscular  disorder ([place])    diabetic neuropathy  . PSVT (paroxysmal supraventricular tachycardia) ([place]) 11/09/2017   Event monitor 09/14/2017 - Predominantly sinus rhythm with episodes of narrow-complex tachycardia suggestive of paroxysmal supraventricular tachycardia.  . S/P appy     Tobacco History: Social History   Tobacco Use  Smoking Status Never Smoker  Smokeless Tobacco Never Used   Counseling given: Not Answered   Outpatient Encounter Medications as of 11/27/2017  Medication Sig  . ACCU-CHEK SOFTCLIX LANCETS lancets TEST 4 TIMES A DAY  . acetaminophen-codeine (TYLENOL #3) 300-30 MG tablet Take 1 tablet by mouth at bedtime as needed for moderate pain.  Marland Kitchen aspirin (BAYER CHILDRENS ASPIRIN) 81 MG chewable tablet Chew 81 mg by mouth daily.    . benzonatate (TESSALON) 100 MG capsule Take 2 capsules (200 mg total) by mouth 3 (three) times daily as needed for cough.  . Blood Glucose Monitoring Suppl (ACCU-CHEK AVIVA) device Use as instructed  . carvedilol (COREG) 25 MG tablet Take 1 tablet (25 mg total) by mouth 2 (two) times daily.  . cholecalciferol (VITAMIN D) 1000 UNITS tablet Take 1,000 Units by mouth daily.  . cyanocobalamin 500 MCG tablet Take 500 mcg by mouth daily.  . Ferrous Sulfate (IRON) 325 (65 Fe) MG TABS Take 1 tablet (325 mg total) by mouth 2 (two) times daily.  . fluticasone (FLONASE) 50 MCG/ACT nasal spray Place 1 spray into both nostrils daily as needed.   . gabapentin (NEURONTIN) 400 MG capsule Take 3 capsules (1,200 mg total) by mouth 2 (two) times daily  as needed (pain).  Marland Kitchen glucose blood (ACCU-CHEK AVIVA PLUS) test strip CHECK SUGAR 3 TIMES A DAILY.  Marland Kitchen guaiFENesin 200 MG tablet Take 1 tablet (200 mg total) by mouth every 8 (eight) hours as needed for cough or to loosen phlegm.  . hydrochlorothiazide (HYDRODIURIL) 25 MG tablet Take 1 tablet (25 mg total) by mouth daily.  . Insulin Glargine (BASAGLAR KWIKPEN) 100 UNIT/ML SOPN INJECT 0.08 MLS (8 UNITS TOTAL) INTO THE SKIN  DAILY.  Marland Kitchen Insulin Pen Needle 31G X 5 MM MISC 1 Container by Does not apply route once as needed.  Marland Kitchen losartan (COZAAR) 100 MG tablet TAKE 1 TABLET BY MOUTH EVERY DAY  . meloxicam (MOBIC) 15 MG tablet Take 1 tablet (15 mg total) by mouth daily.  . metFORMIN (GLUCOPHAGE) 1000 MG tablet TAKE 1 TABLET BY MOUTH TWICE A DAY  . NOVOLOG 100 UNIT/ML injection INJECT 1-4 UNITS INTO THE SKIN 3 (THREE) TIMES DAILY WITH MEALS.  Marland Kitchen PATADAY 0.2 % SOLN PLACE 1 DROP INTO EACH EYE EVERY DAY AS NEEDED  . polyethylene glycol powder (GLYCOLAX/MIRALAX) powder Take 17 g by mouth 3 (three) times daily as needed.  . rosuvastatin (CRESTOR) 5 MG tablet TAKE 1 TABLET (5 MG TOTAL) BY MOUTH DAILY AT 6 PM.  . senna-docusate (SENOKOT-S) 8.6-50 MG tablet Take 1 tablet by mouth daily as needed for mild constipation or moderate constipation.  Marland Kitchen VICTOZA 18 MG/3ML SOPN INJECT 0.1-0.2 ML (0.6-1.2 MG TOTAL) INTO THE SKIN EVERY DAY  . [DISCONTINUED] polyethylene glycol powder (GLYCOLAX/MIRALAX) powder TAKE 17 GRAMS MIXED IN LIQUID THREE TIMES A WEEK   No facility-administered encounter medications on file as of 11/27/2017.      Review of Systems  Constitutional:   No  weight loss, night sweats,  Fevers, chills, fatigue, or  lassitude.  HEENT:   No headaches,  Difficulty swallowing,  Tooth/dental problems, or  Sore throat,                No sneezing, itching, ear ache, nasal congestion, post nasal drip,  positive deafness  CV:  No chest pain,  Orthopnea, PND, swelling in lower extremities, anasarca, dizziness, palpitations, syncope.   GI  No heartburn, indigestion, abdominal pain, nausea, vomiting, diarrhea, change in bowel habits, loss of appetite, bloody stools.   Resp: No shortness of breath with exertion or at rest.  No excess mucus, no productive cough,  No non-productive cough,  No coughing up of blood.  No change in color of mucus.  No wheezing.  No chest wall deformity  Skin: no rash or lesions.  GU: no dysuria, change  in color of urine, no urgency or frequency.  No flank pain, no hematuria   MS:  No joint pain or swelling.  No decreased range of motion.  Positive chronic back pain     Physical Exam  BP 120/62 (BP Location: Left Arm, Cuff Size: Normal)   Pulse 81   Ht 5\' 4"  (1.626 m)   Wt 161 lb 6.4 oz (73.2 kg)   SpO2 95%   BMI 27.70 kg/m   GEN: A/Ox3; pleasant , NAD, well nourished    HEENT:  [place]/AT,  EACs-clear, TMs-wnl, NOSE-clear, THROAT-clear, no lesions, no postnasal drip or exudate noted.  Class II MP airway  NECK:  Supple w/ fair ROM; no JVD; normal carotid impulses w/o bruits; no thyromegaly or nodules palpated; no lymphadenopathy.    RESP  Clear  P & A; w/o, wheezes/ rales/ or rhonchi. no accessory muscle use, no dullness  to percussion  CARD:  RRR, no m/r/g, no peripheral edema, pulses intact, no cyanosis or clubbing.  GI:   Soft & nt; nml bowel sounds; no organomegaly or masses detected.   Musco: Warm bil, no deformities or joint swelling noted.   Neuro: alert, no focal deficits noted.    Skin: Warm, no lesions or rashes    Lab Results:   BMET  BNP No results found for: BNP  ProBNP No results found for: PROBNP  Imaging: No results found.   Assessment & Plan:   OSA (obstructive sleep apnea) Mild sleep apnea with significant daytime sleepiness and sleep disturbances with snoring and restless sleep Patient is to begin CPAP at bedtime.  Patient education was given.  PLAN  Patient Instructions  Begin CPAP At bedtime  .  Wear CPAP for at least 4-6 hr each night .  Work on healthy weight .  Do not drive if sleepy .  Follow up with Dr. Halford Chessman  Or Parrett NP in 2-3 months and As needed            Rexene Edison, NP 11/27/2017

## 2017-11-27 NOTE — Progress Notes (Signed)
Reviewed and agree with assessment/plan.   Amarri Satterly, MD [facility] 06/28/2016, 12:24 PM Pager:  [phone]  

## 2017-11-27 NOTE — Patient Instructions (Addendum)
Begin CPAP At bedtime  .  Wear CPAP for at least 4-6 hr each night .  Work on healthy weight .  Do not drive if sleepy .  Follow up with Dr. Halford Chessman  Or Vinay Ertl NP in 2-3 months and As needed

## 2017-11-27 NOTE — Addendum Note (Signed)
Addended by: Parke Poisson E on: 11/27/2017 10:01 AM   Modules accepted: Orders

## 2017-11-27 NOTE — Assessment & Plan Note (Signed)
Mild sleep apnea with significant daytime sleepiness and sleep disturbances with snoring and restless sleep Patient is to begin CPAP at bedtime.  Patient education was given.  PLAN  Patient Instructions  Begin CPAP At bedtime  .  Wear CPAP for at least 4-6 hr each night .  Work on healthy weight .  Do not drive if sleepy .  Follow up with Dr. Halford Chessman  Or Dominik Yordy NP in 2-3 months and As needed

## 2017-12-01 DIAGNOSIS — M545 Low back pain: Secondary | ICD-10-CM | POA: Diagnosis not present

## 2017-12-06 ENCOUNTER — Telehealth: Payer: Self-pay | Admitting: Student in an Organized Health Care Education/Training Program

## 2017-12-06 NOTE — Telephone Encounter (Signed)
Pt had xrays done at [facility] [facility].  She would like to get dr Burr Medico to get the xrays and let her know what they say. They said dr Burr Medico had to contact them and then they would send the results.

## 2017-12-07 NOTE — Telephone Encounter (Signed)
What sort of xrays and do we need to have patient sign ROI to send over? Can [facility] please be called to ask about this process.

## 2017-12-10 ENCOUNTER — Other Ambulatory Visit: Payer: Self-pay | Admitting: Student in an Organized Health Care Education/Training Program

## 2017-12-10 DIAGNOSIS — M792 Neuralgia and neuritis, unspecified: Secondary | ICD-10-CM

## 2017-12-10 NOTE — Telephone Encounter (Signed)
Contacted pt and informed her that she could go to the [facility] and pick up her results from her xray and she could bring them here or she could wait until her next visit. Katharina Caper, April D, [place]

## 2017-12-10 NOTE — Telephone Encounter (Signed)
Contacted [facility] to see what we needed to do to get the xrays and she said that they had tried to reach the pt to let her know that she could come by and pick up the report and the disk with images on it.  [facility] stated that she had not filled out the Release Of Information form.  Will contact pt and let them know they are going to have this for pt to pick up. Katharina Caper, April D, [place]

## 2017-12-12 ENCOUNTER — Other Ambulatory Visit: Payer: Self-pay | Admitting: Family Medicine

## 2017-12-12 ENCOUNTER — Other Ambulatory Visit: Payer: Self-pay | Admitting: Student in an Organized Health Care Education/Training Program

## 2017-12-12 DIAGNOSIS — Z1231 Encounter for screening mammogram for malignant neoplasm of breast: Secondary | ICD-10-CM

## 2017-12-20 ENCOUNTER — Other Ambulatory Visit: Payer: Self-pay | Admitting: Student in an Organized Health Care Education/Training Program

## 2017-12-20 DIAGNOSIS — I10 Essential (primary) hypertension: Secondary | ICD-10-CM

## 2017-12-24 ENCOUNTER — Other Ambulatory Visit: Payer: Self-pay | Admitting: Student in an Organized Health Care Education/Training Program

## 2017-12-24 DIAGNOSIS — M545 Low back pain, unspecified: Secondary | ICD-10-CM

## 2017-12-26 ENCOUNTER — Ambulatory Visit (INDEPENDENT_AMBULATORY_CARE_PROVIDER_SITE_OTHER): Payer: Medicare Other | Admitting: Student

## 2017-12-26 ENCOUNTER — Other Ambulatory Visit: Payer: Self-pay

## 2017-12-26 ENCOUNTER — Encounter: Payer: Self-pay | Admitting: Student

## 2017-12-26 VITALS — BP 136/66 | HR 81 | Temp 97.9°F | Wt 160.0 lb

## 2017-12-26 DIAGNOSIS — Z789 Other specified health status: Secondary | ICD-10-CM | POA: Diagnosis not present

## 2017-12-26 DIAGNOSIS — M545 Low back pain, unspecified: Secondary | ICD-10-CM

## 2017-12-26 DIAGNOSIS — E1142 Type 2 diabetes mellitus with diabetic polyneuropathy: Secondary | ICD-10-CM

## 2017-12-26 LAB — POCT GLYCOSYLATED HEMOGLOBIN (HGB A1C): HbA1c, POC (controlled diabetic range): 7.6 % — AB (ref 0.0–7.0)

## 2017-12-26 NOTE — Patient Instructions (Signed)
It was great seeing you today! We have addressed the following issues today  Back pain: this is likely due to arthritis.  You also seem to have trochanteric bursitis (see below for more on this).  Unfortunately we do not have an abscess to you x[facility]rays from [facility].  We suggest you bring in the CD of your x-ray for review.  Meanwhile, we suggest you try back exercise as below.  Continue taking Tylenol extra strength every 8 hours.  You can also continue taking meloxicam daily.  We have sent a referral to [facility] for further evaluation and occult therapy.  I also suggested stopping the Crestor and see if there is an improvement in your pain.  If we did any lab work today, and the results require attention, either me or my nurse will get in touch with you. If everything is normal, you will get a letter in mail and a message via . If you don't hear from [name] in two weeks, please give [name] a call. Otherwise, we look forward to seeing you again at your next visit. If you have any questions or concerns before then, please call the clinic at [phone].  Please bring all your medications to every doctors visit  Sign up for My Chart to have easy access to your labs results, and communication with your Primary care physician.    Please check-out at the front desk before leaving the clinic.    Take Care,   Dr. Cyndia Skeeters  Trochanteric Bursitis Trochanteric bursitis is a condition that causes hip pain. Trochanteric bursitis happens when fluid-filled sacs (bursae) in the hip get irritated. Normally these sacs absorb shock and help strong bands of tissue (tendons) in your hip glide smoothly over each other and over your hip bones. What are the causes? This condition results from increased friction between the hip bones and the tendons that go over them. This condition can happen if you:  Have weak hips.  Use your hip muscles too much (overuse).  Get hit in the hip.  What increases the  risk? This condition is more likely to develop in:  Women.  Adults who are middle-aged or older.  People with arthritis or a spinal condition.  People with weak buttocks muscles (gluteal muscles).  People who have one leg that is shorter than the other.  People who participate in certain kinds of athletic activities, such as: ? Running sports, especially long-distance running. ? Contact sports, like football or martial arts. ? Sports in which falls may occur, like skiing.  What are the signs or symptoms? The main symptom of this condition is pain and tenderness over the point of your hip. The pain may be:  Sharp and intense.  Dull and achy.  Felt on the outside of your thigh.  It may increase when you:  Lie on your side.  Walk or run.  Go up on stairs.  Sit.  Stand up after sitting.  Stand for long periods of time.  How is this diagnosed? This condition may be diagnosed based on:  Your symptoms.  Your medical history.  A physical exam.  Imaging tests, such as: ? X-rays to check your bones. ? An MRI or ultrasound to check your tendons and muscles.  During your physical exam, your health care provider will check the movement and strength of your hip. He or she may press on the point of your hip to check for pain. How is this treated? This condition may be  treated by:  Resting.  Reducing your activity.  Avoiding activities that cause pain.  Using crutches, a cane, or a walker to decrease the strain on your hip.  Taking medicine to help with swelling.  Having medicine injected into the bursae to help with swelling.  Using ice, heat, and massage therapy for pain relief.  Physical therapy exercises for strength and flexibility.  Surgery (rare).  Follow these instructions at home: Activity  Rest.  Avoid activities that cause pain.  Return to your normal activities as told by your health care provider. Ask your health care provider what  activities are safe for you. Managing pain, stiffness, and swelling  Take over-the-counter and prescription medicines only as told by your health care provider.  If directed, apply heat to the injured area as told by your health care provider. ? Place a towel between your skin and the heat source. ? Leave the heat on for 20-30 minutes. ? Remove the heat if your skin turns bright red. This is especially important if you are unable to feel pain, heat, or cold. You may have a greater risk of getting burned.  If directed, apply ice to the injured area: ? Put ice in a plastic bag. ? Place a towel between your skin and the bag. ? Leave the ice on for 20 minutes, 2-3 times a day. General instructions  If the affected leg is one that you use for driving, ask your health care provider when it is safe to drive.  Use crutches, a cane, or a walker as told by your health care provider.  If one of your legs is shorter than the other, get fitted for a shoe insert.  Lose weight if you are overweight. How is this prevented?  Wear supportive footwear that is appropriate for your sport.  If you have hip pain, start any new exercise or sport slowly.  Maintain physical fitness, including: ? Strength. ? Flexibility. Contact a health care provider if:  Your pain does not improve with 2-4 weeks. Get help right away if:  You develop severe pain.  You have a fever.  You develop increased redness over your hip.  You have a change in your bowel function or bladder function.  You cannot control the muscles in your feet. This information is not intended to replace advice given to you by your health care provider. Make sure you discuss any questions you have with your health care provider. Document Released: 07/27/2004 Document Revised: 02/23/2016 Document Reviewed: 06/04/2015 Elsevier Interactive Patient Education  2018 [employer].   Back Exercises If you have pain in your back, do these  exercises 2-3 times each day or as told by your doctor. When the pain goes away, do the exercises once each day, but repeat the steps more times for each exercise (do more repetitions). If you do not have pain in your back, do these exercises once each day or as told by your doctor. Exercises Single Knee to Chest  Do these steps 3-5 times in a row for each leg: 1. Lie on your back on a firm bed or the floor with your legs stretched out. 2. Bring one knee to your chest. 3. Hold your knee to your chest by grabbing your knee or thigh. 4. Pull on your knee until you feel a gentle stretch in your lower back. 5. Keep doing the stretch for 10-30 seconds. 6. Slowly let go of your leg and straighten it.  Pelvic Tilt  Do these  steps 5-10 times in a row: 1. Lie on your back on a firm bed or the floor with your legs stretched out. 2. Bend your knees so they point up to the ceiling. Your feet should be flat on the floor. 3. Tighten your lower belly (abdomen) muscles to press your lower back against the floor. This will make your tailbone point up to the ceiling instead of pointing down to your feet or the floor. 4. Stay in this position for 5-10 seconds while you gently tighten your muscles and breathe evenly.  Cat-Cow  Do these steps until your lower back bends more easily: 1. Get on your hands and knees on a firm surface. Keep your hands under your shoulders, and keep your knees under your hips. You may put padding under your knees. 2. Let your head hang down, and make your tailbone point down to the floor so your lower back is round like the back of a cat. 3. Stay in this position for 5 seconds. 4. Slowly lift your head and make your tailbone point up to the ceiling so your back hangs low (sags) like the back of a cow. 5. Stay in this position for 5 seconds.  Press-Ups  Do these steps 5-10 times in a row: 1. Lie on your belly (face-down) on the floor. 2. Place your hands near your head, about  shoulder-width apart. 3. While you keep your back relaxed and keep your hips on the floor, slowly straighten your arms to raise the top half of your body and lift your shoulders. Do not use your back muscles. To make yourself more comfortable, you may change where you place your hands. 4. Stay in this position for 5 seconds. 5. Slowly return to lying flat on the floor.  Bridges  Do these steps 10 times in a row: 1. Lie on your back on a firm surface. 2. Bend your knees so they point up to the ceiling. Your feet should be flat on the floor. 3. Tighten your butt muscles and lift your butt off of the floor until your waist is almost as high as your knees. If you do not feel the muscles working in your butt and the back of your thighs, slide your feet 1-2 inches farther away from your butt. 4. Stay in this position for 3-5 seconds. 5. Slowly lower your butt to the floor, and let your butt muscles relax.  If this exercise is too easy, try doing it with your arms crossed over your chest. Belly Crunches  Do these steps 5-10 times in a row: 1. Lie on your back on a firm bed or the floor with your legs stretched out. 2. Bend your knees so they point up to the ceiling. Your feet should be flat on the floor. 3. Cross your arms over your chest. 4. Tip your chin a little bit toward your chest but do not bend your neck. 5. Tighten your belly muscles and slowly raise your chest just enough to lift your shoulder blades a tiny bit off of the floor. 6. Slowly lower your chest and your head to the floor.  Back Lifts Do these steps 5-10 times in a row: 1. Lie on your belly (face-down) with your arms at your sides, and rest your forehead on the floor. 2. Tighten the muscles in your legs and your butt. 3. Slowly lift your chest off of the floor while you keep your hips on the floor. Keep the back of your head  in line with the curve in your back. Look at the floor while you do this. 4. Stay in this position  for 3-5 seconds. 5. Slowly lower your chest and your face to the floor.  Contact a doctor if:  Your back pain gets a lot worse when you do an exercise.  Your back pain does not lessen 2 hours after you exercise. If you have any of these problems, stop doing the exercises. Do not do them again unless your doctor says it is okay. Get help right away if:  You have sudden, very bad back pain. If this happens, stop doing the exercises. Do not do them again unless your doctor says it is okay. This information is not intended to replace advice given to you by your health care provider. Make sure you discuss any questions you have with your health care provider. Document Released: 07/22/2010 Document Revised: 11/25/2015 Document Reviewed: 08/13/2014 Elsevier Interactive Patient Education  [employer].

## 2017-12-26 NOTE — Progress Notes (Signed)
Subjective:    Alicia [name] is a 62 y.o. old female here for follow-up on lower back pain.  She is here with an interpreter by the name Tyrone Nine.   HPI Lower back pain: is a chronic issue.  No history of trauma or injury.  No recent change.  Pain is over the lower back bilaterally.  Reports some radiation to her legs to knee level posteriorly.  Describes the pain as aching.  Pain is worse at the end of the day after work.  She denies numbness, tingling, fever, chills, bowel or bladder issue and saddle anesthesia.  She tried meloxicam without significant improvement. He was seen in clinic about a month ago.  At that time lumbar and hip x-ray were ordered.  [facility]  Was advised to bring in the x-ray results but thought we have an access to her result.  Patient also have history of statin intolerance in the past.  She is on a small dose Crestor  PMH/Problem List: has Diabetic polyneuropathy ([place]); Type 2 diabetes mellitus without complication, without long-term current use of insulin ([place]); HYPERCHOLESTEROLEMIA; HEARING LOSS NOS OR DEAFNESS; Essential hypertension, benign; Asthma; ARTHRITIS, KNEE; Diabetic retinopathy ([place]); Iron deficiency anemia; Carpal tunnel syndrome; Seizure ([place]); New onset of headaches after age 59; OSA (obstructive sleep apnea); Bilateral low back pain without sciatica; Hyperlipidemia; and PSVT (paroxysmal supraventricular tachycardia) ([facility]) on their problem list.   has a past medical history of Anemia, Complete deafness, Deaf, Diabetes mellitus, Diabetic neuropathy ([place]), Ganglion cyst (06/22/2011), Gastroparesis, GERD (gastroesophageal reflux disease), H/O: C-section, Hyperlipidemia, Hypertension, Neuromuscular disorder ([place]), PSVT (paroxysmal supraventricular tachycardia) ([place]) (11/09/2017), and S/P appy.  FH:  Family History  Problem Relation Age of Onset  . Hypertension Mother   . Heart attack Mother 59  . Diabetes Father   . Cancer Maternal  Aunt   . Cancer Maternal Grandmother     SH Social History   Tobacco Use  . Smoking status: Never Smoker  . Smokeless tobacco: Never Used  Substance Use Topics  . Alcohol use: Yes    Alcohol/week: 1.8 oz    Types: 3 Glasses of wine per week  . Drug use: No    Review of Systems Review of systems negative except for pertinent positives and negatives in history of present illness above.     Objective:     Vitals:   12/26/17 0829  BP: 136/66  Pulse: 81  Temp: 97.9 F (36.6 C)  TempSrc: Oral  SpO2: 95%  Weight: 160 lb (72.6 kg)   Body mass index is 27.46 kg/m.  Physical Exam  GEN: appears well & comfortable. No apparent distress. Head: normocephalic and atraumatic RESP: no IWOB MSK: Back & LE exam  Normal skin, spine with normal alignment and no deformity. LE symmetric appears symmetric  No step offs.  Tenderness to palpation over L4 spinous process and adjacent paraspinal muscles bilaterally.  Tenderness to palpation over trochanteric bursa bilaterally.  ROM is full at the lumbosacral region.  Positive FABER bilaterally.  Negative FADIR bilaterally.  Lying and seated SLR negative  Neuro exam in LE: motor 5/5 in all muscle groups, light sensation intact in L4-S1 dermatomes, patellar reflexes 2+ bilaterally  DP pulses 2+ bilaterally  SKIN: no apparent skin lesion NEURO: alert and oiented appropriately, no gross deficits   PSYCH: euthymic mood with congruent affect    Assessment and Plan:  1. Bilateral low back pain without sciatica, unspecified chronicity: this is likely due to  osteoarthritis.  There may be some element of trochanteric bursitis on exam but patient didn't complain pain localized to the area. She also has history of statin intolerance.  She was restarted on Crestor 5 mg daily.  She [facility]  She states she forgot to bring in the CD thinking that we have an access to her x-ray results. She is already on meloxicam  15 mg daily.  Added extra strength Tylenol every 8 hours.  I also gave her handout about back exercise. We will give a break from a statin and see if there is improvement in her pain. She may be a candidate for PCSK-9 inhibitor. She is also interested in physical therapy but she works from Monday to Friday.  She asked if she can get a referral to [facility] seemed to have a physical therapy office open on the weekends. - Ambulatory referral to Orthopedic Surgery  2. Diabetic polyneuropathy associated with type 2 diabetes mellitus ([place]): A1c 7.6% today. -No changes to medication.  Follow-up with PCP.   Return if symptoms worsen or fail to improve.  Mercy Riding, MD 12/26/17 Pager: [phone]

## 2017-12-30 ENCOUNTER — Other Ambulatory Visit: Payer: Self-pay | Admitting: Student in an Organized Health Care Education/Training Program

## 2017-12-30 DIAGNOSIS — E7800 Pure hypercholesterolemia, unspecified: Secondary | ICD-10-CM

## 2018-01-02 ENCOUNTER — Telehealth: Payer: Self-pay | Admitting: Student in an Organized Health Care Education/Training Program

## 2018-01-02 NOTE — Telephone Encounter (Signed)
Patient came to office with 3 CD's to give to her pcp. Placed in Dr Ernestina Penna mail box.

## 2018-01-11 ENCOUNTER — Ambulatory Visit
Admission: RE | Admit: 2018-01-11 | Discharge: 2018-01-11 | Disposition: A | Discharge status: Home / Self Care | Payer: Medicare Other | Source: Ambulatory Visit | Attending: *Deleted | Admitting: *Deleted

## 2018-01-11 DIAGNOSIS — Z1231 Encounter for screening mammogram for malignant neoplasm of breast: Secondary | ICD-10-CM

## 2018-01-17 NOTE — Telephone Encounter (Signed)
Pt is calling to check the status of Xray's that she dropped off.  She is still having pain that is now radiating to her back.  Will forward to MD to advise. Fleeger, Salome Spotted, CMA

## 2018-01-21 ENCOUNTER — Other Ambulatory Visit: Payer: Self-pay

## 2018-01-21 DIAGNOSIS — D649 Anemia, unspecified: Secondary | ICD-10-CM

## 2018-01-23 MED ORDER — IRON 325 (65 FE) MG PO TABS: 1.0000 | ORAL_TABLET | Freq: Two times a day (BID) | ORAL | 0 refills | Status: DC

## 2018-01-23 NOTE — Telephone Encounter (Signed)
I am having difficulty opening the imaging from the CD despite trying on multiple computers. Can we have the patient sign a release form so that the radiology read may be faxed to [name]?

## 2018-01-24 ENCOUNTER — Other Ambulatory Visit: Payer: Self-pay | Admitting: Family Medicine

## 2018-01-24 DIAGNOSIS — M545 Low back pain, unspecified: Secondary | ICD-10-CM

## 2018-01-25 NOTE — Telephone Encounter (Signed)
Pt informed.  She will go to radiologist and sign a ROI. Fleeger, Alicia [name], CMA

## 2018-01-25 NOTE — Telephone Encounter (Signed)
LM via the sign language interpreter to call office back to inform her of below. Alicia [name], Benigno Check D, [place]

## 2018-01-29 ENCOUNTER — Other Ambulatory Visit: Payer: Self-pay | Admitting: Family Medicine

## 2018-01-29 ENCOUNTER — Ambulatory Visit: Payer: Self-pay | Admitting: Pulmonary Disease

## 2018-01-29 DIAGNOSIS — M792 Neuralgia and neuritis, unspecified: Secondary | ICD-10-CM

## 2018-02-04 ENCOUNTER — Other Ambulatory Visit: Payer: Self-pay | Admitting: Student in an Organized Health Care Education/Training Program

## 2018-02-04 DIAGNOSIS — Z794 Long term (current) use of insulin: Principal | ICD-10-CM

## 2018-02-04 DIAGNOSIS — E119 Type 2 diabetes mellitus without complications: Secondary | ICD-10-CM

## 2018-02-14 ENCOUNTER — Telehealth: Payer: Self-pay | Admitting: Student in an Organized Health Care Education/Training Program

## 2018-02-14 NOTE — Telephone Encounter (Signed)
Pt would like a referral to do a colonoscopy jw

## 2018-02-15 ENCOUNTER — Other Ambulatory Visit: Payer: Self-pay | Admitting: Student in an Organized Health Care Education/Training Program

## 2018-02-15 DIAGNOSIS — Z1211 Encounter for screening for malignant neoplasm of colon: Secondary | ICD-10-CM

## 2018-02-15 NOTE — Telephone Encounter (Signed)
Referral to GI for colonoscopy placed

## 2018-02-21 ENCOUNTER — Other Ambulatory Visit: Payer: Self-pay | Admitting: Student in an Organized Health Care Education/Training Program

## 2018-02-21 DIAGNOSIS — M792 Neuralgia and neuritis, unspecified: Secondary | ICD-10-CM

## 2018-02-22 ENCOUNTER — Ambulatory Visit: Payer: Self-pay | Admitting: Pulmonary Disease

## 2018-02-27 ENCOUNTER — Other Ambulatory Visit: Payer: Self-pay | Admitting: Student in an Organized Health Care Education/Training Program

## 2018-02-27 DIAGNOSIS — M545 Low back pain, unspecified: Secondary | ICD-10-CM

## 2018-03-12 ENCOUNTER — Telehealth: Payer: Self-pay | Admitting: Student in an Organized Health Care Education/Training Program

## 2018-03-12 NOTE — Telephone Encounter (Signed)
Called patient to schedule health maintenance appointment. LVM to call office. Left office number. Kg.

## 2018-03-12 NOTE — Telephone Encounter (Signed)
Pt called back advised to schedule health maintenance appt. Appt scheduled for 04/17/18 at 8:30

## 2018-03-15 DIAGNOSIS — Z961 Presence of intraocular lens: Secondary | ICD-10-CM | POA: Diagnosis not present

## 2018-03-15 DIAGNOSIS — H26491 Other secondary cataract, right eye: Secondary | ICD-10-CM | POA: Diagnosis not present

## 2018-03-15 DIAGNOSIS — H43393 Other vitreous opacities, bilateral: Secondary | ICD-10-CM | POA: Diagnosis not present

## 2018-03-15 DIAGNOSIS — E113593 Type 2 diabetes mellitus with proliferative diabetic retinopathy without macular edema, bilateral: Secondary | ICD-10-CM | POA: Diagnosis not present

## 2018-03-20 ENCOUNTER — Ambulatory Visit (INDEPENDENT_AMBULATORY_CARE_PROVIDER_SITE_OTHER): Payer: Medicare Other | Admitting: Pulmonary Disease

## 2018-03-20 ENCOUNTER — Encounter: Payer: Self-pay | Admitting: Pulmonary Disease

## 2018-03-20 VITALS — BP 126/80 | HR 86 | Ht 63.0 in | Wt 165.6 lb

## 2018-03-20 DIAGNOSIS — Z9989 Dependence on other enabling machines and devices: Secondary | ICD-10-CM

## 2018-03-20 DIAGNOSIS — G4733 Obstructive sleep apnea (adult) (pediatric): Secondary | ICD-10-CM | POA: Diagnosis not present

## 2018-03-20 NOTE — Patient Instructions (Signed)
Can look up mask options at CPAP.com or similar web site  Will send order for your home care company to refit your CPAP mask  Follow up in 6 months

## 2018-03-20 NOTE — Progress Notes (Signed)
[facility]  Chief Complaint  Patient presents with  . Follow-up    Pt is having issues with the mask, and having hard time with it more so in last 3 weeks. Pt stopped using cpap last 3 months.     Constitutional: BP 126/80 (BP Location: Left Arm, Cuff Size: Normal)   Pulse 86   Ht 5\' 3"  (1.6 m)   Wt 165 lb 9.6 oz (75.1 kg)   SpO2 98%   BMI 29.33 kg/m   History of Present Illness: Alicia [name] is a 62 y.o. female with obstructive sleep apnea.  She is here with a sign language interpreter.  She feels CPAP helps.  No issues with pressure.  She has hybrid mask.  Only mask she has tried.  She got skin rash and air leak.  Made it difficult for her to sleep.  She had to stop using mask due to skin rash.  Comprehensive Respiratory Exam:  Appearance - well kempt  ENMT - nasal mucosa moist, turbinates clear, midline nasal septum, no dental lesions, no gingival bleeding, no oral exudates, no tonsillar hypertrophy Neck - no masses, trachea midline, no thyromegaly, no elevation in JVP Respiratory - normal appearance of chest wall, normal respiratory effort w/o accessory muscle use, no dullness on percussion, no wheezing or rales CV - s1s2 regular rate and rhythm, no murmurs, no peripheral edema, radial pulses symmetric GI - soft, non tender Lymph - no adenopathy noted in neck and axillary areas MSK - normal muscle strength and tone, normal gait Ext - no cyanosis, clubbing, or joint inflammation noted Skin - no rashes, lesions, or ulcers Neuro - oriented to person, place, and time Psych - normal mood and affect  Assessment/Plan:  Obstructive sleep apnea. - CPAP helped - developed reaction and air leak with hybrid mask - she will look up mask options on line - will send order for her DME to refit her mask - continue auto CPAP   Patient Instructions  Can look up mask options at CPAP.com or similar web site  Will send order for your home  care company to refit your CPAP mask  Follow up in 6 months    Chesley Mires, MD [place] 03/20/2018, 10:35 AM  Flow Sheet  Sleep tests: Sleep study 08/23/16 >> AHI 6.6, SpO2 low 78% Auto CPAP 12/18/17 to 01/10/18 >> used on 24 of 24 nights with average 3 hrs 53 min.  Average AHI 0.7 with median CPAP 7 and 95 th percentile CPAP 9 cm H2O  Past Medical History: She  has a past medical history of Anemia, Complete deafness, Deaf, Diabetes mellitus, Diabetic neuropathy ([place]), Ganglion cyst (06/22/2011), Gastroparesis, GERD (gastroesophageal reflux disease), H/O: C-section, Hyperlipidemia, Hypertension, Neuromuscular disorder ([place]), PSVT (paroxysmal supraventricular tachycardia) ([place]) (11/09/2017), and S/P appy.  Past Surgical History: She  has a past surgical history that includes Appendectomy; Tubal ligation; Cesarean section; Uterine fibroid embolization (2007); cardiolyte EF 77%, no ischemia in 2006 (2006); Wrist surgery; Wrist surgery (Right, 1985); Carpal tunnel release (Right, 04/20/2015); Ulnar nerve transposition (Right, 04/20/2015); Trigger finger release (Right, 04/20/2015); Open reduction internal fixation (orif) distal radial fracture (Left, 11/04/2015); and Carpal tunnel release (Left, 11/04/2015).  Family History: Her family history includes Cancer in her maternal aunt and maternal grandmother; Diabetes in her father; Heart attack (age of onset: 31) in her mother; Hypertension in her mother.  Social History: She  reports that she has never smoked. She has never used smokeless tobacco.  She reports that she drinks about 3.0 standard drinks of alcohol per week. She reports that she does not use drugs.  Medications: Allergies as of 03/20/2018      Reactions   Sulfonamide Derivatives Swelling, Rash   REACTION: rash, swelling - "Lost BABY" - Terrible itching.    Lipitor [atorvastatin Calcium] Other (See Comments)   Muscle Aches - Mild-Moderate - completely resolved with  D/C of atorva.    Ramipril Other (See Comments)   REACTION: cough   Trulicity [dulaglutide] Nausea And Vomiting      Medication List        Accurate as of 03/20/18 10:35 AM. Always use your most recent med list.          ACCU-CHEK SOFTCLIX LANCETS lancets TEST 4 TIMES A DAY   acetaminophen-codeine 300-30 MG tablet Commonly known as:  TYLENOL #3 Take 1 tablet by mouth at bedtime as needed for moderate pain.   BASAGLAR KWIKPEN 100 UNIT/ML Sopn INJECT 0.08 MLS (8 UNITS TOTAL) INTO THE SKIN DAILY.   BAYER CHILDRENS ASPIRIN 81 MG chewable tablet Generic drug:  aspirin Chew 81 mg by mouth daily.   carvedilol 25 MG tablet Commonly known as:  COREG Take 1 tablet (25 mg total) by mouth 2 (two) times daily.   carvedilol 25 MG tablet Commonly known as:  COREG Take 1 tablet (25 mg total) by mouth 2 (two) times daily with a meal.   cholecalciferol 1000 units tablet Commonly known as:  VITAMIN D Take 1,000 Units by mouth daily.   fluticasone 50 MCG/ACT nasal spray Commonly known as:  FLONASE Place 1 spray into both nostrils daily as needed.   gabapentin 400 MG capsule Commonly known as:  NEURONTIN TAKE 3 CAPSULES BY MOUTH TWICE A DAY AS NEEDED FOR PAIN   glucose blood test strip CHECK SUGAR 3 TIMES A DAILY.   guaiFENesin 200 MG tablet Take 1 tablet (200 mg total) by mouth every 8 (eight) hours as needed for cough or to loosen phlegm.   hydrochlorothiazide 25 MG tablet Commonly known as:  HYDRODIURIL Take 1 tablet (25 mg total) by mouth daily.   Insulin Pen Needle 31G X 5 MM Misc 1 Container by Does not apply route once as needed.   Iron 325 (65 Fe) MG Tabs Take 1 tablet (325 mg total) by mouth 2 (two) times daily.   losartan 100 MG tablet Commonly known as:  COZAAR TAKE 1 TABLET BY MOUTH EVERY DAY   meloxicam 15 MG tablet Commonly known as:  MOBIC TAKE 1 TABLET BY MOUTH EVERY DAY   metFORMIN 1000 MG tablet Commonly known as:  GLUCOPHAGE TAKE 1 TABLET BY  MOUTH TWICE A DAY   NOVOLOG 100 UNIT/ML injection Generic drug:  insulin aspart INJECT 1-4 UNITS INTO THE SKIN 3 (THREE) TIMES DAILY WITH MEALS.   PATADAY 0.2 % Soln Generic drug:  Olopatadine HCl PLACE 1 DROP INTO EACH EYE EVERY DAY AS NEEDED   polyethylene glycol powder powder Commonly known as:  GLYCOLAX/MIRALAX Take 17 g by mouth 3 (three) times daily as needed.   senna-docusate 8.6-50 MG tablet Commonly known as:  Senokot-S Take 1 tablet by mouth daily as needed for mild constipation or moderate constipation.   VICTOZA 18 MG/3ML Sopn Generic drug:  liraglutide INJECT 0.1-0.2 ML (0.6-1.2 MG TOTAL) INTO THE SKIN EVERY DAY   vitamin B-12 500 MCG tablet Commonly known as:  CYANOCOBALAMIN Take 500 mcg by mouth daily.

## 2018-03-24 ENCOUNTER — Other Ambulatory Visit: Payer: Self-pay | Admitting: Family Medicine

## 2018-03-24 DIAGNOSIS — E119 Type 2 diabetes mellitus without complications: Secondary | ICD-10-CM

## 2018-03-24 DIAGNOSIS — Z794 Long term (current) use of insulin: Principal | ICD-10-CM

## 2018-03-25 ENCOUNTER — Other Ambulatory Visit: Payer: Self-pay | Admitting: Student in an Organized Health Care Education/Training Program

## 2018-03-25 DIAGNOSIS — M545 Low back pain, unspecified: Secondary | ICD-10-CM

## 2018-04-17 ENCOUNTER — Ambulatory Visit (INDEPENDENT_AMBULATORY_CARE_PROVIDER_SITE_OTHER): Payer: Medicare Other | Admitting: Student in an Organized Health Care Education/Training Program

## 2018-04-17 ENCOUNTER — Encounter: Payer: Self-pay | Admitting: Student in an Organized Health Care Education/Training Program

## 2018-04-17 ENCOUNTER — Other Ambulatory Visit: Payer: Self-pay

## 2018-04-17 VITALS — BP 136/74 | HR 92 | Temp 97.8°F | Ht 63.0 in | Wt 161.6 lb

## 2018-04-17 DIAGNOSIS — Z23 Encounter for immunization: Secondary | ICD-10-CM | POA: Diagnosis not present

## 2018-04-17 DIAGNOSIS — E119 Type 2 diabetes mellitus without complications: Secondary | ICD-10-CM | POA: Diagnosis not present

## 2018-04-17 LAB — POCT GLYCOSYLATED HEMOGLOBIN (HGB A1C): HbA1c, POC (controlled diabetic range): 7.1 % — AB (ref 0.0–7.0)

## 2018-04-17 NOTE — Patient Instructions (Signed)
It was a pleasure seeing you today in our clinic.  Here is the treatment plan we have discussed and agreed upon together:  Diabetes Your diabetes has improved! Your A1c at today's visit was 7.1. We will keep all of your medicines the same.  Follow up in 3 months.  Our clinic's number is [phone]. Please call with questions or concerns about what we discussed today.  Be well, Dr. Burr Medico
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
## 2018-04-26 ENCOUNTER — Telehealth: Payer: Self-pay | Admitting: *Deleted

## 2018-04-26 NOTE — Telephone Encounter (Signed)
Pt called nurse line and wanted to speak with a nurse.  No other message left.  RT to pt.  She has not heard anything else about a GI referral.  We sent back in August. Gave number to call and check status. Antony Sian, Salome Spotted, CMA

## 2018-04-28 ENCOUNTER — Other Ambulatory Visit: Payer: Self-pay | Admitting: Student in an Organized Health Care Education/Training Program

## 2018-04-28 DIAGNOSIS — M545 Low back pain, unspecified: Secondary | ICD-10-CM

## 2018-05-01 ENCOUNTER — Other Ambulatory Visit: Payer: Self-pay | Admitting: *Deleted

## 2018-05-02 NOTE — Telephone Encounter (Signed)
Pt calls back.  She is actually a pt @ [facility].  Leory Plowman (ref coord) will resend to [place]. Analiza Cowger, Salome Spotted, CMA

## 2018-05-06 ENCOUNTER — Other Ambulatory Visit: Payer: Self-pay | Admitting: Student in an Organized Health Care Education/Training Program

## 2018-05-06 DIAGNOSIS — M545 Low back pain, unspecified: Secondary | ICD-10-CM

## 2018-05-07 NOTE — Telephone Encounter (Signed)
Pt called to check the status of meloxicam.   She would like to know why it cant be refilled because she is still having pain in her back and legs.  She states that the meloxicam didn't make it go away completely but did help.   Will forward to MD with new info. Kala Gassmann, Salome Spotted, CMA

## 2018-05-10 NOTE — Telephone Encounter (Signed)
Patient calling back to check on this. Would still like it refilled or does she need an OV?  Call back is [phone]  Danley Danker, RN [facility] RN)

## 2018-05-15 NOTE — Telephone Encounter (Signed)
We talked about reflux at her last visit and she indicated that she is not using this medication, which is why I refused it. Long term NSAID use may be contributing to her chronic abdominal pain.  It would be best for her her to come in for an OV.

## 2018-05-15 NOTE — Telephone Encounter (Signed)
Pt informed and appointment scheduled. Zimmerman Rumple, Ernest Orr D, CMA  

## 2018-05-24 ENCOUNTER — Other Ambulatory Visit: Payer: Self-pay

## 2018-05-24 ENCOUNTER — Encounter: Payer: Self-pay | Admitting: Family Medicine

## 2018-05-24 ENCOUNTER — Ambulatory Visit (INDEPENDENT_AMBULATORY_CARE_PROVIDER_SITE_OTHER): Payer: Medicare Other | Admitting: Family Medicine

## 2018-05-24 VITALS — BP 138/70 | HR 80 | Temp 98.0°F | Ht 63.0 in | Wt 160.0 lb

## 2018-05-24 DIAGNOSIS — M159 Polyosteoarthritis, unspecified: Secondary | ICD-10-CM | POA: Diagnosis not present

## 2018-05-24 DIAGNOSIS — K219 Gastro-esophageal reflux disease without esophagitis: Secondary | ICD-10-CM

## 2018-05-24 MED ORDER — OMEPRAZOLE 20 MG PO CPDR: 20.0000 mg | DELAYED_RELEASE_CAPSULE | Freq: Every day | ORAL | 3 refills | Status: DC

## 2018-05-24 MED ORDER — MELOXICAM 7.5 MG PO TABS: 7.5000 mg | ORAL_TABLET | Freq: Every day | ORAL | 1 refills | Status: DC

## 2018-05-24 NOTE — Patient Instructions (Addendum)
It was nice to meet you today,   Your pain is from arthritis.  The medicines we can use for helping the pain in arthritis are tylenol and NSAIDs.  Continue taking the tylenol and I have prescribe you an NSAID called meloxicam.  You take this once a day.  Please don't take other NSAIDs with this, such as ibuoprofen or alleve.    Please make an appointment to get your colonoscopy soon. If you would like to talk with your sleep apnea doctor first that is okay, but it is not necessary.  People with sleep apnea get colonoscopies all the time.    I also believe you have acid reflux, which is treated with a PPI.  You can get these over the counter but I have also prescribed one for you.  Take it once a day in the morning 30 minutes before breakfast with water.    Have a great day,   Clemetine Marker, MD

## 2018-05-24 NOTE — Progress Notes (Signed)
[facility] Phone: [phone]   cc: back and leg pain  Subjective:  Patient stated it started 3 months ago. She was Told to take tylenol.  She says one of the doctors told her alleve wouldn't help for this pain so she didn't use it. She Tried Ibuoprofen for three days but it didn't work. Has not tried physical therapy because she doesn't have time due to her schedule.   Feels worse after getting in the car after work because the car sits so low to the ground.  Having trouble driving because of it.  Gets worse throughout the day.  She states It feels like her lumbar vertabrae are rubbing against each other.  Her hips hurt as well and the pain radiates down her right leg every day.  The pain goes all the way down to her foot.  Describes at a soreness or tightness, sometimes feels like 'fire'.  It never goes numb.  No incontinence, no saddle anesthesia.  . She had a hip and spinal xray in may at [facility] but she was unable to upload the images from the CD  Reflux: Vomiting on Monday evening and Wednesday morning after eating, felt stomach pain and vomited up.   She was taking some kind of medication for reflux but forgets what it was. She complained about dyspepsia during last visit but has not taken medicine for it since that time.   Colonoscopy: She states a doctor told her she Needs to call for permission to get colonoscopy due to her sleep apnea, but she plans on doing that and scheduling her colonoscopy soon.   Has appointment for ophthalmology exam on December 9th.     ROS: See HPI for pertinent positives and negatives  Past Medical History  Family history reviewed for today's visit. No changes.  Objective: BP 138/70   Pulse 80   Temp 98 F (36.7 C) (Oral)   Ht 5\' 3"  (1.6 m)   Wt 160 lb (72.6 kg)   SpO2 96%   BMI 28.34 kg/m  Gen: NAD, alert and oriented, cooperative with exam. Patient deaf and communicatd through an interpreter.  CV: normal rate, regular  rhythm. No murmurs, no rubs.  Resp: LCTAB, no wheezes, crackles. normal work of breathing GI: nontender to palpation, BS present, no guarding or organomegaly Msk: patient walks slowly in deliberately and appears in a moderate amount of pain when ambulating.  She has TTP over the lumbar spine, trochanters, thighs.  Negative straight legged test bilaterally.  No crepitus over patella.  Pain in hips with internal and external rotation of lower extremities.  Psych: Appropriate behavior  Assessment/Plan: Osteoarthritis involving multiple joints on both sides of body Patient has pain on her lumbar spine, bilateral hips and her right knee.  There is documentation of similar complaints in her chart going back a few years.  Patient endorses sciatic pain but straight legged/cross legged test was negative on exam.  Patient has only been taking tylenol so far because she believes she was told alleve wouldn't help and ibuprofen didn't help her.  Told patient that osteoarthritic pain does not go away and the best we can do is reduce the pain.  Told her weight loss and weight bearing exercise will help reduce pain.  She states she would not be able to do the exercise due to pain.  Prescribed patient meloxicam 7.5 and told her to continue taking tylenol.    GERD During last visit patient complained of bloating  and burning sensation in her chest.  Today she described two episodes of vomiting after eating meals.  Patient signs and symptoms consistent with acid reflux. Will prescribe 20mg  omeprazole daily and continue to monitor.   Clemetine Marker, MD PGY-1

## 2018-05-25 DIAGNOSIS — M159 Polyosteoarthritis, unspecified: Secondary | ICD-10-CM | POA: Insufficient documentation

## 2018-05-25 NOTE — Assessment & Plan Note (Signed)
During last visit patient complained of bloating and burning sensation in her chest.  Today she described two episodes of vomiting after eating meals.  Patient signs and symptoms consistent with acid reflux. Will prescribe 20mg  omeprazole daily and continue to monitor.

## 2018-05-25 NOTE — Assessment & Plan Note (Deleted)
During last visit patient complained of bloating and burning sensation in her chest.  Today she described two episodes of vomiting after eating meals.  Patient signs and symptoms consistent with acid reflux. Will prescribe 20mg  omeprazole daily and continue to monitor.

## 2018-05-25 NOTE — Assessment & Plan Note (Signed)
Patient has pain on her lumbar spine, bilateral hips and her right knee.  There is documentation of similar complaints in her chart going back a few years.  Patient endorses sciatic pain but straight legged/cross legged test was negative on exam.  Patient has only been taking tylenol so far because she believes she was told alleve wouldn't help and ibuprofen didn't help her.  Told patient that osteoarthritic pain does not go away and the best we can do is reduce the pain.  Told her weight loss and weight bearing exercise will help reduce pain.  She states she would not be able to do the exercise due to pain.  Prescribed patient meloxicam 7.5 and told her to continue taking tylenol.

## 2018-05-28 ENCOUNTER — Telehealth: Payer: Self-pay | Admitting: Pulmonary Disease

## 2018-05-28 NOTE — Telephone Encounter (Signed)
Called patient via interpreter but she did not answer. Left VM with interpreter for patient to call back.

## 2018-06-03 NOTE — Telephone Encounter (Signed)
Called pt via sign language interpreter line letting her know about the request we received for procedure clearance from GI in order for her to have a colonoscopy. Per pt, she was told that she might need to have another sleep study performed.  I stated to the interpreter and pt that we needed to schedule her an OV so we can get all worked out for the procedure clearance. Pt expressed understanding. An appt has been scheduled for pt Tues. 06/11/18 at 9:15. Pt is requesting to have an actual sign language interpreter at the office and not to use the computer for interpretation. Checked with Sharl Ma and it is noted in pt's appt desk already that she needs to have an in-person interpreter.  Nothing further needed.

## 2018-06-06 ENCOUNTER — Ambulatory Visit (HOSPITAL_COMMUNITY)
Admission: EM | Admit: 2018-06-06 | Discharge: 2018-06-06 | Disposition: A | Discharge status: Home / Self Care | Payer: Medicare Other | Attending: Family Medicine | Admitting: Family Medicine

## 2018-06-06 ENCOUNTER — Encounter (HOSPITAL_COMMUNITY): Payer: Self-pay | Admitting: Emergency Medicine

## 2018-06-06 DIAGNOSIS — H1032 Unspecified acute conjunctivitis, left eye: Secondary | ICD-10-CM

## 2018-06-06 DIAGNOSIS — J019 Acute sinusitis, unspecified: Secondary | ICD-10-CM

## 2018-06-06 MED ORDER — AMOXICILLIN-POT CLAVULANATE 875-125 MG PO TABS: 1.0000 | ORAL_TABLET | Freq: Two times a day (BID) | ORAL | 0 refills | Status: DC

## 2018-06-06 MED ORDER — ERYTHROMYCIN 5 MG/GM OP OINT: TOPICAL_OINTMENT | OPHTHALMIC | 0 refills | Status: DC

## 2018-06-06 MED ORDER — PSEUDOEPHEDRINE HCL ER 120 MG PO TB12: 120.0000 mg | ORAL_TABLET | Freq: Two times a day (BID) | ORAL | 0 refills | Status: AC

## 2018-06-06 MED ORDER — FLUTICASONE PROPIONATE 50 MCG/ACT NA SUSP: 2.0000 | Freq: Every day | NASAL | 0 refills | Status: DC

## 2018-06-06 NOTE — ED Triage Notes (Signed)
PT reports left eye was uncomfortable yesterday. She woke up with redness in that eye and had drainage overnight. 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

## 2018-06-06 NOTE — ED Provider Notes (Signed)
[facility]    CSN: [id number] Arrival date & time: 06/06/18  1044     History   Chief Complaint Chief Complaint  Patient presents with  . Eye Problem    HPI Alicia [name] is a 62 y.o. female.   Subjective:  ASL interpretation by Jonelle Sidle [phone]  Alicia [name] is a 63 y.o. female who presents for evaluation of decreased vision, redness and foreign body sensation. She has noticed the above symptoms in the left eye for 1 day. Onset was sudden. Symptoms have included blurred vision, discharge, erythema and foreign body sensation. Patient denies itching, pain and visual field deficit. Patient works at a daycare and suspects that she has had contact with "pink eye" as she thinks that three of her students have had it. She also presents for evaluation of possible sinus infection. Symptoms include bilateral ear pressure, achiness, facial pain, lightheadedness, sinus pressure and sneezing with no cough, sore throat, fever, chills, night sweats or weight loss. Onset of symptoms was 2 days ago and has been unchanged since that time. She is drinking plenty of fluids. Past history is significant for no history of pneumonia or bronchitis. Patient is a non-smoker. Patient has taken OTC flu medications, allergy medicine and tylenol for her symptoms without much relief in her symptoms.   The following portions of the patient's history were reviewed and updated as appropriate: allergies, current medications, past family history, past medical history, past social history, past surgical history and problem list.        Past Medical History:  Diagnosis Date  . Anemia   . Complete deafness    Meningitis at age 43  . Deaf   . Diabetes mellitus   . Diabetic neuropathy ([place])   . Ganglion cyst 06/22/2011  . Gastroparesis   . GERD (gastroesophageal reflux disease)   . H/O: C-section   . Hyperlipidemia   . Hypertension   . Neuromuscular disorder ([place])    diabetic neuropathy  . PSVT  (paroxysmal supraventricular tachycardia) ([place]) 11/09/2017   Event monitor 09/14/2017 - Predominantly sinus rhythm with episodes of narrow-complex tachycardia suggestive of paroxysmal supraventricular tachycardia.  . S/P appy     Patient Active Problem List   Diagnosis Date Noted  . Osteoarthritis involving multiple joints on both sides of body 05/25/2018  . Statin intolerance 12/26/2017  . PSVT (paroxysmal supraventricular tachycardia) ([place]) 11/09/2017  . Hyperlipidemia 08/30/2017  . OSA (obstructive sleep apnea) 08/31/2016  . Seizure ([place]) 06/08/2014  . Iron deficiency anemia 07/01/2012  . Diabetic retinopathy ([place]) 07/21/2011  . Asthma 04/04/2010  . GERD 12/30/2007  . ARTHRITIS, KNEE 09/17/2007  . Diabetic polyneuropathy ([place]) 08/30/2006  . Type 2 diabetes mellitus without complication, without long-term current use of insulin ([place]) 08/30/2006  . HYPERCHOLESTEROLEMIA 08/30/2006  . HEARING LOSS NOS OR DEAFNESS 08/30/2006    Past Surgical History:  Procedure Laterality Date  . APPENDECTOMY    . cardiolyte EF 77%, no ischemia in 2006  2006  . CARPAL TUNNEL RELEASE Right 04/20/2015   Procedure: RIGHT CARPAL TUNNEL RELEASE;  Surgeon: Daryll Brod, MD;  Location: [facility];  Service: Orthopedics;  Laterality: Right;  . CARPAL TUNNEL RELEASE Left 11/04/2015   Procedure: CARPAL TUNNEL RELEASE;  Surgeon: Daryll Brod, MD;  Location: [facility];  Service: Orthopedics;  Laterality: Left;  . CESAREAN SECTION    . OPEN REDUCTION INTERNAL FIXATION (ORIF) DISTAL RADIAL FRACTURE Left 11/04/2015   Procedure: OPEN REDUCTION INTERNAL FIXATION (ORIF) LEFT DISTAL  RADIAL FRACTURE POSSIBLE BONE GRAFT;  Surgeon: Daryll Brod, MD;  Location: [facility];  Service: Orthopedics;  Laterality: Left;  . TRIGGER FINGER RELEASE Right 04/20/2015   Procedure: RELEASE TRIGGER FINGER/A-1 PULLEY RIGHT MIDDLE FINGER,RIGHT RING FINGER;  Surgeon: Daryll Brod, MD;  Location: [facility];  Service: Orthopedics;  Laterality: Right;  . TUBAL LIGATION    . ULNAR NERVE TRANSPOSITION Right 04/20/2015   Procedure: RIGHT ULNAR NERVE DECOMPRESSION;  Surgeon: Daryll Brod, MD;  Location: [facility];  Service: Orthopedics;  Laterality: Right;  . UTERINE FIBROID EMBOLIZATION  2007  . WRIST SURGERY     Cyst removed on left  . WRIST SURGERY Right 1985   tendon repair R wrist    OB History   None      Home Medications    Prior to Admission medications   Medication Sig Start Date End Date Taking? Authorizing Provider  ACCU-CHEK SOFTCLIX LANCETS lancets TEST 4 TIMES A DAY 07/11/17   Everrett Coombe, MD  amoxicillin-clavulanate (AUGMENTIN) 875-125 MG tablet Take 1 tablet by mouth every 12 (twelve) hours. 06/06/18   Enrique Sack, FNP  aspirin ([employer] ASPIRIN) 81 MG chewable tablet Chew 81 mg by mouth daily.      [provider]  carvedilol (COREG) 25 MG tablet Take 1 tablet (25 mg total) by mouth 2 (two) times daily. 09/27/17   End, Harrell Gave, MD  cholecalciferol (VITAMIN D) 1000 UNITS tablet Take 1,000 Units by mouth daily.    [provider]  cyanocobalamin 500 MCG tablet Take 500 mcg by mouth daily.    [provider]  erythromycin ophthalmic ointment Place a 1/2 inch ribbon of ointment into the left lower eyelid four times daily for 5 days 06/06/18   Enrique Sack, FNP  Ferrous Sulfate (IRON) 325 (65 Fe) MG TABS Take 1 tablet (325 mg total) by mouth 2 (two) times daily. 01/23/18   Everrett Coombe, MD  fluticasone (FLONASE) 50 MCG/ACT nasal spray Place 1 spray into both nostrils daily as needed.     [provider]  fluticasone (FLONASE) 50 MCG/ACT nasal spray Place 2 sprays into both nostrils daily. 06/06/18   Enrique Sack, FNP  gabapentin (NEURONTIN) 400 MG capsule TAKE 3 CAPSULES BY MOUTH TWICE A DAY AS NEEDED FOR PAIN 02/21/18   Everrett Coombe, MD  glucose blood (ACCU-CHEK AVIVA PLUS) test strip CHECK SUGAR  3 TIMES A DAILY. 08/17/17   Leeanne Rio, MD  hydrochlorothiazide (HYDRODIURIL) 25 MG tablet Take 1 tablet (25 mg total) by mouth daily. 10/22/17   Mercy Riding, MD  Insulin Glargine (BASAGLAR KWIKPEN) 100 UNIT/ML SOPN INJECT 0.08 MLS (8 UNITS TOTAL) INTO THE SKIN DAILY. 08/09/17   Everrett Coombe, MD  Insulin Pen Needle 31G X 5 MM MISC 1 Container by Does not apply route once as needed. 08/11/16   Zenia Resides, MD  losartan (COZAAR) 100 MG tablet TAKE 1 TABLET BY MOUTH EVERY DAY 11/12/17   Everrett Coombe, MD  meloxicam (MOBIC) 7.5 MG tablet Take 1 tablet (7.5 mg total) by mouth daily. 05/24/18   Benay Pike, MD  metFORMIN (GLUCOPHAGE) 1000 MG tablet TAKE 1 TABLET BY MOUTH TWICE A DAY 09/03/17   Everrett Coombe, MD  NOVOLOG 100 UNIT/ML injection INJECT 1-4 UNITS INTO THE SKIN 3 TIMES DAILY WITH MEALS. 03/27/18   Everrett Coombe, MD  omeprazole (PRILOSEC) 20 MG capsule Take 1 capsule (20 mg total) by mouth daily. 05/24/18   Benay Pike,  MD  PATADAY 0.2 % SOLN PLACE 1 DROP INTO EACH EYE EVERY DAY AS NEEDED 05/24/15   [provider]  polyethylene glycol powder (GLYCOLAX/MIRALAX) powder Take 17 g by mouth 3 (three) times daily as needed. 09/12/17   Everrett Coombe, MD  pseudoephedrine (SUDAFED 12 HOUR) 120 MG 12 hr tablet Take 1 tablet (120 mg total) by mouth 2 (two) times daily for 7 days. 06/06/18 06/13/18  Enrique Sack, FNP  senna-docusate (SENOKOT-S) 8.6-50 MG tablet Take 1 tablet by mouth daily as needed for mild constipation or moderate constipation. 09/12/17   Everrett Coombe, MD  VICTOZA 18 MG/3ML SOPN INJECT 0.1-0.2 ML (0.6-1.2 MG TOTAL) INTO THE SKIN EVERY DAY 02/05/18   Everrett Coombe, MD    Family History Family History  Problem Relation Age of Onset  . Hypertension Mother   . Heart attack Mother 94  . Diabetes Father   . Cancer Maternal Aunt   . Cancer Maternal Grandmother     Social History Social History   Tobacco Use  . Smoking status: Never Smoker  . Smokeless tobacco:  Never Used  Substance Use Topics  . Alcohol use: Yes    Alcohol/week: 3.0 standard drinks    Types: 3 Glasses of wine per week  . Drug use: No     Allergies   Sulfonamide derivatives; Lipitor [atorvastatin calcium]; Ramipril; and Trulicity [dulaglutide]   Review of Systems Review of Systems  Constitutional: Negative for fever.  HENT: Positive for ear pain, rhinorrhea, sinus pressure and sneezing. Negative for congestion and sore throat.   Eyes: Positive for photophobia, discharge, redness and visual disturbance. Negative for pain and itching.  Respiratory: Negative for cough.   Cardiovascular: Negative.   Gastrointestinal: Negative.   Neurological: Positive for light-headedness and headaches.  All other systems reviewed and are negative.    Physical Exam Triage Vital Signs ED Triage Vitals  Enc Vitals Group     BP 06/06/18 1158 (!) 152/84     Pulse Rate 06/06/18 1158 82     Resp 06/06/18 1158 16     Temp 06/06/18 1158 (!) 97.4 F (36.3 C)     Temp Source 06/06/18 1158 Oral     SpO2 06/06/18 1158 99 %     Weight --      Height --      Head Circumference --      Peak Flow --      Pain Score 06/06/18 1156 2     Pain Loc --      Pain Edu? --      Excl. in [place]? --    No data found.  Updated Vital Signs BP (!) 152/84   Pulse 82   Temp (!) 97.4 F (36.3 C) (Oral)   Resp 16   SpO2 99%   Visual Acuity Right Eye Distance:   Left Eye Distance:   Bilateral Distance:    Right Eye Near:   Left Eye Near:    Bilateral Near:     Physical Exam  Constitutional: She is oriented to person, place, and time. She appears well-developed and well-nourished.  HENT:  Head: Normocephalic.  Right Ear: External ear normal.  Left Ear: External ear normal.  Nose: Nose normal. No sinus tenderness.  Mouth/Throat: Oropharynx is clear and moist.  Facial pressure but no sinus tenderness noted   Eyes: Lids are normal. Lids are everted and swept, no foreign bodies found. Right  conjunctiva is injected. Right pupil is not reactive. Pupils are unequal.  Pupils unequal and right pupil is non reactive. Patient reports that this is baseline secondary to meningitis as a child.   Neck: Normal range of motion. Neck supple.  Cardiovascular: Normal rate and regular rhythm.  Pulmonary/Chest: Effort normal and breath sounds normal.  Musculoskeletal: Normal range of motion.  Lymphadenopathy:    She has no cervical adenopathy.  Neurological: She is alert and oriented to person, place, and time.  Skin: Skin is warm and dry.  Psychiatric: She has a normal mood and affect.     UC Treatments / Results  Labs (all labs ordered are listed, but only abnormal results are displayed) Labs Reviewed - No data to display  EKG None  Radiology No results found.  Procedures Procedures (including critical care time)  Medications Ordered in UC Medications - No data to display  Initial Impression / Assessment and Plan / UC Course  I have reviewed the triage vital signs and the nursing notes.  Pertinent labs & imaging results that were available during my care of the patient were reviewed by me and considered in my medical decision making (see chart for details).     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  She is alert and oriented x3.  Afebrile.  Nontoxic-appearing.  Plan:  1. Sudafed BID x 7 days  2. Augmentin BID x 7 days  3. Flonase daily  4. Ophthalmic ointment per orders. 5. Warm compress to eye(s). 6. OTC analgesics as needed. 7. Follow-up with opthalmology and PCP as needed   Today's evaluation has revealed no signs of a dangerous process. Discussed diagnosis with patient. Patient aware of their diagnosis, possible red flag symptoms to  watch out for and need for close follow up. Patient understands verbal and written discharge instructions. Patient comfortable with plan and disposition.  Patient has a clear mental status at this time, good insight into illness (after discussion and teaching) and has clear judgment to make decisions regarding their care.  Documentation was completed with the aid of voice recognition software. Transcription may contain typographical errors. Final Clinical Impressions(s) / UC Diagnoses   Final diagnoses:  Acute bacterial conjunctivitis of left eye  Acute sinusitis, recurrence not specified, unspecified location     Discharge Instructions     Take medications as prescribed. May take tylenol or ibuprofen as needed for pain. Drink plenty of fluids.     ED Prescriptions    Medication Sig Dispense Auth. Provider   erythromycin ophthalmic ointment Place a 1/2 inch ribbon of ointment into the left lower eyelid four times daily for 5 days 1 g Enrique Sack, FNP   amoxicillin-clavulanate (AUGMENTIN) 875-125 MG tablet Take 1 tablet by mouth every 12 (twelve) hours. 14 tablet Luzelena Heeg, Aldona Bar, FNP   fluticasone (FLONASE) 50 MCG/ACT nasal spray Place 2 sprays into both nostrils daily. 16 g Enrique Sack, FNP   pseudoephedrine (SUDAFED 12 HOUR) 120 MG 12 hr tablet Take 1 tablet (120 mg total) by mouth 2 (two) times daily for 7 days. 14 tablet Enrique Sack, FNP     Controlled Substance Prescriptions [place] Controlled Substance Registry consulted? Not Applicable   Enrique Sack, FNP 06/06/18 1250

## 2018-06-06 NOTE — Discharge Instructions (Signed)
Take medications as prescribed. May take tylenol or ibuprofen as needed for pain. Drink plenty of fluids.

## 2018-06-11 ENCOUNTER — Encounter: Payer: Self-pay | Admitting: Primary Care

## 2018-06-11 ENCOUNTER — Telehealth: Payer: Self-pay | Admitting: Primary Care

## 2018-06-11 ENCOUNTER — Ambulatory Visit (INDEPENDENT_AMBULATORY_CARE_PROVIDER_SITE_OTHER): Payer: Medicare Other | Admitting: Primary Care

## 2018-06-11 VITALS — BP 132/64 | HR 88 | Temp 98.0°F | Ht 63.0 in | Wt 164.0 lb

## 2018-06-11 DIAGNOSIS — G4733 Obstructive sleep apnea (adult) (pediatric): Secondary | ICD-10-CM | POA: Diagnosis not present

## 2018-06-11 DIAGNOSIS — Z01818 Encounter for other preprocedural examination: Secondary | ICD-10-CM | POA: Diagnosis not present

## 2018-06-11 NOTE — Assessment & Plan Note (Addendum)
-   Clinically stable, no recent exacerbations - Not on daily maintenance inhaler - Spirometry today showed no evidence of restriction or obstruction. Essentially normal, patient had some difficulty following instructions for test d/t communication barrier   Spirometry 06/11/2018 FVC 2.0 (80%), FEV1 1.5 (75%), ratio 73

## 2018-06-11 NOTE — Progress Notes (Signed)
Reviewed and agree with assessment/plan.   Athziry Millican, MD [facility] 06/28/2016, 12:24 PM Pager:  [phone]  

## 2018-06-11 NOTE — Assessment & Plan Note (Signed)
-   Mild obstructive sleep apnea on sleep study in 2018 - Not wearing CPAP d/t mask fit issues and poor tolerance - Given sample mask today in office - Continue to encourage compliance with device  - Recommend weight loss and side sleeping position  - Consider dental device, likely medicare will not cover and patient would have to pay out of pocket which I doubt she will be able to do  - Follow up in 3 months with Dr. Halford Chessman

## 2018-06-11 NOTE — Telephone Encounter (Signed)
Please advise patient that she needs to bring her cpap device and mask with her to colonoscopy - just in case they need to use it after procedure if she is somnolent. Thanks

## 2018-06-11 NOTE — Assessment & Plan Note (Addendum)
Cleared for colonoscopy from pulmonary standpoint. Asthma is clinically stable. Spirometry normal 06/11/2018. Patient has mild obstructive sleep apnea, need CPAP mask post procedure if somnolent.   Recommend 1. Short duration of surgery/procedure, avoid paralytic if possible. Recommend conscious sedation.  2. CPAP device/mask available post procedure, place if somnolent  3. DVT prophylaxis 4. Aggressive pulmonary toilet with o2, bronchodilatation, incentive spirometry and early ambulation

## 2018-06-11 NOTE — Patient Instructions (Addendum)
  Try to wear CPAP every night for 4 hours to the best of your ability  Needs mask fitting for CPAP  Unfortunately I do not think medicare will cover dental appliance (I will check) Do not drive if experiencing excessive daytime fatigue or somnolence   Cleared for colonoscopy   Follow up with Dr. Halford Chessman March for OSA review    Sleep Apnea Sleep apnea is a condition that affects breathing. People with sleep apnea have moments during sleep when their breathing pauses briefly or gets shallow. Sleep apnea can cause these symptoms:  Trouble staying asleep.  Sleepiness or tiredness during the day.  Irritability.  Loud snoring.  Morning headaches.  Trouble concentrating.  Forgetting things.  Less interest in sex.  Being sleepy for no reason.  Mood swings.  Personality changes.  Depression.  Waking up a lot during the night to pee (urinate).  Dry mouth.  Sore throat.  Follow these instructions at home:  Make any changes in your routine that your doctor recommends.  Eat a healthy, well-balanced diet.  Take over-the-counter and prescription medicines only as told by your doctor.  Avoid using alcohol, calming medicines (sedatives), and narcotic medicines.  Take steps to lose weight if you are overweight.  If you were given a machine (device) to use while you sleep, use it only as told by your doctor.  Do not use any tobacco products, such as cigarettes, chewing tobacco, and e-cigarettes. If you need help quitting, ask your doctor.  Keep all follow-up visits as told by your doctor. This is important. Contact a doctor if:  The machine that you were given to use during sleep is uncomfortable or does not seem to be working.  Your symptoms do not get better.  Your symptoms get worse. Get help right away if:  Your chest hurts.  You have trouble breathing in enough air (shortness of breath).  You have an uncomfortable feeling in your back, arms, or stomach.  You  have trouble talking.  One side of your body feels weak.  A part of your face is hanging down (drooping). These symptoms may be an emergency. Do not wait to see if the symptoms will go away. Get medical help right away. Call your local emergency services (911 in the [place] Do not drive yourself to the hospital. This information is not intended to replace advice given to you by your health care provider. Make sure you discuss any questions you have with your health care provider. Document Released: 03/28/2008 Document Revised: 02/13/2016 Document Reviewed: 03/29/2015 Elsevier Interactive Patient Education  [employer].

## 2018-06-11 NOTE — Progress Notes (Signed)
@Patient  ID: Alicia [name], female    DOB: 01/21/1956, 62 y.o.   MRN: [id number]  Chief Complaint  Patient presents with  . Follow-up    clearance for colonoscopy-not using CPAP    Referring provider: Everrett Coombe, MD  HPI: 62 year old female, never smoked. PMH significant for OSA and asthma. Patient of Dr. Halford Chessman, last seen 03/20/18. Sleep study 08/23/16, AHI 6.6, SpO2 low 78%.   06/11/2018 Patient presents today for clearance for colonoscopy, date to be determined. Accompanied by medical interpreter. Feels well, no acute complaints. Reports that her breathing is fine, having no issues. Denies wheezing, shortness of breath or cough. Asthma symptoms have been stable with no recent exacerbations. Not on daily inhalers or rescue inhalers. Some difficulty understanding spirometry instructions d/t language barrier but essentially normal spirometry today in office   Not using cpap since September because of mask fit issues and reports that she was just not able to tolerate citing that it caused a cough, dried her up and made it difficult for her to sleep. Sleep study in 2018 showed mild obstructive sleep apnea mild with AHI 6.6. Discussed other options including dental devise, she is not sure if she wants referral. Patient has medicare, may not cover.   Recently seen in ED on 06/06/18 for bacterial conjunctivitis of left eye and acute sinusitis. She was treated with course of Augmentin and given ophthalmic ointment. She reports that she feels better now and symptoms have resolved.    Spirometry 06/11/2018 FVC 2.0 (80%), FEV1 1.5 (75%), ratio 73  Allergies  Allergen Reactions  . Sulfonamide Derivatives Swelling and Rash    REACTION: rash, swelling - "Lost BABY" - Terrible itching.   . Lipitor [Atorvastatin Calcium] Other (See Comments)    Muscle Aches - Mild-Moderate - completely resolved with D/C of atorva.   . Ramipril Other (See Comments)    REACTION: cough  . Trulicity [Dulaglutide]  Nausea And Vomiting    Immunization History  Administered Date(s) Administered  . Influenza Split 06/22/2011, 03/29/2012  . Influenza Whole 06/14/2007, 07/16/2009, 05/13/2010  . Influenza,inj,Quad PF,6+ Mos 04/01/2013, 04/15/2014, 03/23/2015, 03/07/2016, 05/02/2017, 04/17/2018  . Pneumococcal Polysaccharide-23 04/03/2003, 08/23/2012  . Td 12/06/2003  . Tdap 09/04/2014    Past Medical History:  Diagnosis Date  . Anemia   . Complete deafness    Meningitis at age 28  . Deaf   . Diabetes mellitus   . Diabetic neuropathy ([place])   . Ganglion cyst 06/22/2011  . Gastroparesis   . GERD (gastroesophageal reflux disease)   . H/O: C-section   . Hyperlipidemia   . Hypertension   . Neuromuscular disorder ([place])    diabetic neuropathy  . PSVT (paroxysmal supraventricular tachycardia) ([place]) 11/09/2017   Event monitor 09/14/2017 - Predominantly sinus rhythm with episodes of narrow-complex tachycardia suggestive of paroxysmal supraventricular tachycardia.  . S/P appy     Tobacco History: Social History   Tobacco Use  Smoking Status Never Smoker  Smokeless Tobacco Never Used   Counseling given: Not Answered   Outpatient Medications Prior to Visit  Medication Sig Dispense Refill  . ACCU-CHEK SOFTCLIX LANCETS lancets TEST 4 TIMES A DAY 200 each 0  . amoxicillin-clavulanate (AUGMENTIN) 875-125 MG tablet Take 1 tablet by mouth every 12 (twelve) hours. 14 tablet 0  . aspirin (BAYER CHILDRENS ASPIRIN) 81 MG chewable tablet Chew 81 mg by mouth daily.      . carvedilol (COREG) 25 MG tablet Take 1 tablet (25 mg total) by mouth 2 (two)  times daily. 180 tablet 3  . cholecalciferol (VITAMIN D) 1000 UNITS tablet Take 1,000 Units by mouth daily.    . cyanocobalamin 500 MCG tablet Take 500 mcg by mouth daily.    Marland Kitchen erythromycin ophthalmic ointment Place a 1/2 inch ribbon of ointment into the left lower eyelid four times daily for 5 days 1 g 0  . Ferrous Sulfate (IRON) 325 (65 Fe) MG TABS Take 1 tablet  (325 mg total) by mouth 2 (two) times daily. 180 each 0  . fluticasone (FLONASE) 50 MCG/ACT nasal spray Place 2 sprays into both nostrils daily. 16 g 0  . gabapentin (NEURONTIN) 400 MG capsule TAKE 3 CAPSULES BY MOUTH TWICE A DAY AS NEEDED FOR PAIN 540 capsule 2  . glucose blood (ACCU-CHEK AVIVA PLUS) test strip CHECK SUGAR 3 TIMES A DAILY. 100 each 12  . hydrochlorothiazide (HYDRODIURIL) 25 MG tablet Take 1 tablet (25 mg total) by mouth daily. 30 tablet 0  . Insulin Glargine (BASAGLAR KWIKPEN) 100 UNIT/ML SOPN INJECT 0.08 MLS (8 UNITS TOTAL) INTO THE SKIN DAILY. 15 pen 3  . Insulin Pen Needle 31G X 5 MM MISC 1 Container by Does not apply route once as needed. 100 each 11  . losartan (COZAAR) 100 MG tablet TAKE 1 TABLET BY MOUTH EVERY DAY 90 tablet 3  . meloxicam (MOBIC) 7.5 MG tablet Take 1 tablet (7.5 mg total) by mouth daily. 30 tablet 1  . metFORMIN (GLUCOPHAGE) 1000 MG tablet TAKE 1 TABLET BY MOUTH TWICE A DAY 60 tablet 11  . NOVOLOG 100 UNIT/ML injection INJECT 1-4 UNITS INTO THE SKIN 3 TIMES DAILY WITH MEALS. 10 mL 2  . omeprazole (PRILOSEC) 20 MG capsule Take 1 capsule (20 mg total) by mouth daily. 30 capsule 3  . PATADAY 0.2 % SOLN PLACE 1 DROP INTO EACH EYE EVERY DAY AS NEEDED  12  . polyethylene glycol powder (GLYCOLAX/MIRALAX) powder Take 17 g by mouth 3 (three) times daily as needed. 3350 g 1  . pseudoephedrine (SUDAFED 12 HOUR) 120 MG 12 hr tablet Take 1 tablet (120 mg total) by mouth 2 (two) times daily for 7 days. 14 tablet 0  . senna-docusate (SENOKOT-S) 8.6-50 MG tablet Take 1 tablet by mouth daily as needed for mild constipation or moderate constipation. 30 tablet 0  . VICTOZA 18 MG/3ML SOPN INJECT 0.1-0.2 ML (0.6-1.2 MG TOTAL) INTO THE SKIN EVERY DAY 18 pen 3  . fluticasone (FLONASE) 50 MCG/ACT nasal spray Place 1 spray into both nostrils daily as needed.      No facility-administered medications prior to visit.     Review of Systems  Review of Systems  Constitutional:  Negative.   HENT: Negative.   Respiratory: Negative for apnea, cough, choking, chest tightness, shortness of breath, wheezing and stridor.   Cardiovascular: Negative.   Musculoskeletal: Positive for back pain.    Physical Exam  BP 132/64 (BP Location: Right Arm, Cuff Size: Normal)   Pulse 88   Temp 98 F (36.7 C)   Ht 5\' 3"  (1.6 m)   Wt 164 lb (74.4 kg)   SpO2 100%   BMI 29.05 kg/m  Physical Exam  Constitutional: She is oriented to person, place, and time. She appears well-developed and well-nourished.  Sign language for communication   HENT:  Head: Normocephalic and atraumatic.  Eyes: Pupils are equal, round, and reactive to light. EOM are normal.  Neck: Normal range of motion. Neck supple.  Cardiovascular: Normal rate, regular rhythm and normal heart sounds.  No murmur heard. Pulmonary/Chest: Effort normal and breath sounds normal. No respiratory distress. She has no wheezes.  CTA. No resp distress or wheezing.   Abdominal: Soft. Bowel sounds are normal. There is no tenderness.  Neurological: She is alert and oriented to person, place, and time.  Skin: Skin is warm and dry. No rash noted. No erythema.  Psychiatric: She has a normal mood and affect. Her behavior is normal. Judgment normal.     Lab Results:  CBC    Component Value Date/Time   WBC 5.6 10/15/2017 1109   WBC 5.7 09/28/2014 1032   RBC 3.67 (L) 10/15/2017 1109   RBC 4.00 09/28/2014 1032   HGB 9.0 (L) 10/15/2017 1109   HCT 27.6 (L) 10/22/2017 1127   PLT 336 09/28/2014 1032   MCV 80 10/15/2017 1109   MCH 24.5 (L) 10/15/2017 1109   MCH 29.5 09/28/2014 1032   MCHC 30.8 (L) 10/15/2017 1109   MCHC 32.3 09/28/2014 1032   RDW 17.1 (H) 10/15/2017 1109   LYMPHSABS 1.8 10/15/2017 1109   MONOABS 0.5 09/28/2014 1032   EOSABS 0.2 10/15/2017 1109   BASOSABS 0.0 10/15/2017 1109    BMET    Component Value Date/Time   NA 139 10/15/2017 1109   K 4.5 10/15/2017 1109   CL 98 10/15/2017 1109   CO2 25 10/15/2017  1109   GLUCOSE 295 (H) 10/15/2017 1109   GLUCOSE 217 (H) 07/28/2016 0936   BUN 15 10/15/2017 1109   CREATININE 0.89 10/15/2017 1109   CREATININE 0.75 07/28/2016 0936   CALCIUM 10.6 (H) 10/15/2017 1109   CALCIUM 10.3 09/30/2010 1658   GFRNONAA 70 10/15/2017 1109   GFRNONAA 87 07/28/2016 0936   GFRAA 81 10/15/2017 1109   GFRAA >89 07/28/2016 0936    BNP No results found for: BNP  ProBNP No results found for: PROBNP  Imaging: No results found.   Assessment & Plan:   Asthma - Clinically stable, no recent exacerbations - Not on daily maintenance inhaler - Spirometry today showed no evidence of restriction or obstruction. Essentially normal, patient had some difficulty following instructions for test d/t communication barrier   Spirometry 06/11/2018 FVC 2.0 (80%), FEV1 1.5 (75%), ratio 73  OSA (obstructive sleep apnea) - Mild obstructive sleep apnea on sleep study in 2018 - Not wearing CPAP d/t mask fit issues and poor tolerance - Given sample mask today in office - Continue to encourage compliance with device  - Recommend weight loss and side sleeping position  - Consider dental device, likely medicare will not cover and patient would have to pay out of pocket which I doubt she will be able to do  - Follow up in 3 months with Dr. Halford Chessman   Pre-op exam Cleared for colonoscopy from pulmonary standpoint. Asthma is clinically stable. Spirometry normal 06/11/2018. Patient has mild obstructive sleep apnea, need CPAP mask post procedure if somnolent.   Recommend 1. Short duration of surgery/procedure, avoid paralytic if possible. Recommend conscious sedation.  2. CPAP device/mask available post procedure, place if somnolent  3. DVT prophylaxis 4. Aggressive pulmonary toilet with o2, bronchodilatation, incentive spirometry and early ambulation    Martyn Ehrich, NP 06/11/2018

## 2018-06-13 ENCOUNTER — Telehealth: Payer: Self-pay | Admitting: Pulmonary Disease

## 2018-06-13 NOTE — Telephone Encounter (Signed)
I called pt but there was no answer. Left message to call back.

## 2018-06-13 NOTE — Telephone Encounter (Signed)
She should speak with her DME about getting a different hose.

## 2018-06-13 NOTE — Telephone Encounter (Signed)
Spoke with the pt  She states at last ov she was given sample of a CPAP mask She states that the mask fits well and is comfortble, but it does not connect to her CPAP hose  Please advise what to do thanks

## 2018-06-14 NOTE — Telephone Encounter (Signed)
Pt is calling back [phone]

## 2018-06-14 NOTE — Telephone Encounter (Signed)
Called and spoke with patient she is aware and verbalized understanding. Nothing further needed.  

## 2018-06-19 NOTE — Telephone Encounter (Signed)
Tried to call again but could not get through.  Will try again.

## 2018-06-20 NOTE — Telephone Encounter (Signed)
Phone rang and no one picked up.  Will send letter.

## 2018-06-21 ENCOUNTER — Other Ambulatory Visit: Payer: Self-pay | Admitting: Student in an Organized Health Care Education/Training Program

## 2018-06-21 DIAGNOSIS — E119 Type 2 diabetes mellitus without complications: Secondary | ICD-10-CM

## 2018-06-21 DIAGNOSIS — Z794 Long term (current) use of insulin: Principal | ICD-10-CM

## 2018-06-24 NOTE — Telephone Encounter (Signed)
Sent letter.  Will close.

## 2018-07-05 ENCOUNTER — Other Ambulatory Visit: Payer: Self-pay | Admitting: Student in an Organized Health Care Education/Training Program

## 2018-07-05 DIAGNOSIS — Z794 Long term (current) use of insulin: Principal | ICD-10-CM

## 2018-07-05 DIAGNOSIS — E119 Type 2 diabetes mellitus without complications: Secondary | ICD-10-CM

## 2018-07-18 DIAGNOSIS — E113512 Type 2 diabetes mellitus with proliferative diabetic retinopathy with macular edema, left eye: Secondary | ICD-10-CM | POA: Diagnosis not present

## 2018-07-18 DIAGNOSIS — E113591 Type 2 diabetes mellitus with proliferative diabetic retinopathy without macular edema, right eye: Secondary | ICD-10-CM | POA: Diagnosis not present

## 2018-07-21 ENCOUNTER — Other Ambulatory Visit: Payer: Self-pay | Admitting: Family Medicine

## 2018-07-25 DIAGNOSIS — R140 Abdominal distension (gaseous): Secondary | ICD-10-CM | POA: Diagnosis not present

## 2018-07-25 DIAGNOSIS — K219 Gastro-esophageal reflux disease without esophagitis: Secondary | ICD-10-CM | POA: Diagnosis not present

## 2018-07-25 DIAGNOSIS — Z1211 Encounter for screening for malignant neoplasm of colon: Secondary | ICD-10-CM | POA: Diagnosis not present

## 2018-07-25 DIAGNOSIS — R197 Diarrhea, unspecified: Secondary | ICD-10-CM | POA: Diagnosis not present

## 2018-08-01 ENCOUNTER — Other Ambulatory Visit: Payer: Self-pay | Admitting: Student in an Organized Health Care Education/Training Program

## 2018-08-01 DIAGNOSIS — Z794 Long term (current) use of insulin: Principal | ICD-10-CM

## 2018-08-01 DIAGNOSIS — E119 Type 2 diabetes mellitus without complications: Secondary | ICD-10-CM

## 2018-08-14 ENCOUNTER — Other Ambulatory Visit: Payer: Self-pay | Admitting: Student in an Organized Health Care Education/Training Program

## 2018-08-15 ENCOUNTER — Telehealth: Payer: Self-pay | Admitting: Pulmonary Disease

## 2018-08-15 ENCOUNTER — Other Ambulatory Visit: Payer: Self-pay | Admitting: Family Medicine

## 2018-08-15 DIAGNOSIS — Z794 Long term (current) use of insulin: Secondary | ICD-10-CM

## 2018-08-15 DIAGNOSIS — E118 Type 2 diabetes mellitus with unspecified complications: Secondary | ICD-10-CM

## 2018-08-15 DIAGNOSIS — G4733 Obstructive sleep apnea (adult) (pediatric): Secondary | ICD-10-CM

## 2018-08-15 NOTE — Telephone Encounter (Signed)
Spoke with pt. States that she is having issues with her CPAP with [employer]. She called [employer] to refill her CPAP supplies and was told that she would need to turn her CPAP machine. Pt is confused and doesn't know what's going on.  I have called [employer] and spoke with Raven. States that pt has not been compliant and insurance has stopped paying for her machine. She will have to have a CPAP titration in order to get her machine back.  LMTCB x1 for pt.

## 2018-08-16 NOTE — Telephone Encounter (Signed)
Spoke with pt, and advised her what [employer] stated. Pt understood and would like to proceed with having the CPAP titration study. Dr. Halford Chessman can we order? Please advise.

## 2018-08-19 ENCOUNTER — Other Ambulatory Visit: Payer: Self-pay | Admitting: Family Medicine

## 2018-08-19 NOTE — Telephone Encounter (Signed)
Previously routed to Dr Sood 

## 2018-08-20 NOTE — Telephone Encounter (Signed)
It looks like if they needed anything that it would be an updated HST rather than a CPAP titration. Could you clarify this with DME company?

## 2018-08-20 NOTE — Telephone Encounter (Signed)
Yes. Please order. Thanks.  

## 2018-08-20 NOTE — Telephone Encounter (Signed)
Spoke with [name] at [facility], states that because patient was noncompliant insurance requires the requalification to be done in-lab.  [name] please advise if ok to order.  Thanks!

## 2018-08-20 NOTE — Telephone Encounter (Signed)
I will route this message due to Dr. Halford Chessman being out.  TN please advise if we may do a titration study on this patient.  Spoke with pt. States that she is having issues with her CPAP with [employer]. She called [employer] to refill her CPAP supplies and was told that she would need to turn her CPAP machine. Pt is confused and doesn't know what's going on.  I have called [employer] and spoke with Raven. States that pt has not been compliant and insurance has stopped paying for her machine. She will have to have a CPAP titration in order to get her machine back.  LMTCB x1 for pt.

## 2018-08-20 NOTE — Telephone Encounter (Signed)
cpap titration ordered.   lmtcb for pt to make aware of recs.

## 2018-08-21 NOTE — Telephone Encounter (Signed)
Spoke with the pt and notified she needs to have the cpap titration done  She verbalized understanding and denies any questions

## 2018-08-27 ENCOUNTER — Encounter: Payer: Self-pay | Admitting: Family Medicine

## 2018-08-27 ENCOUNTER — Other Ambulatory Visit: Payer: Self-pay

## 2018-08-27 ENCOUNTER — Ambulatory Visit (INDEPENDENT_AMBULATORY_CARE_PROVIDER_SITE_OTHER): Payer: Medicare Other | Admitting: Family Medicine

## 2018-08-27 VITALS — BP 110/54 | HR 91 | Temp 97.7°F | Wt 163.5 lb

## 2018-08-27 DIAGNOSIS — J302 Other seasonal allergic rhinitis: Secondary | ICD-10-CM | POA: Diagnosis not present

## 2018-08-27 DIAGNOSIS — M159 Polyosteoarthritis, unspecified: Secondary | ICD-10-CM

## 2018-08-27 MED ORDER — FEXOFENADINE HCL 60 MG PO TABS: 60.0000 mg | ORAL_TABLET | Freq: Two times a day (BID) | ORAL | Status: DC

## 2018-08-27 MED ORDER — MELOXICAM 15 MG PO TABS: 15.0000 mg | ORAL_TABLET | Freq: Every day | ORAL | 1 refills | Status: DC

## 2018-08-27 NOTE — Assessment & Plan Note (Signed)
Continued hip and lumbar spine pain. meloxicam 7.5mg  did not help her so she stopped taking it.  Exercise has not helped. She is still taking tylenol.  Patient did endorse hip tenderness to palpation. Need to consider trochanteric bursitis if pain does not improve and/or x rays do not show signs of arthritis.  Previous x ray in 2016 showed mild lumbar degenerative changes and normal pelvis.   - repeat lumbar and pelvis x rays - increase meloxicam to 15mg  daily - follow up in approximately one month with PCP. Consider steroid injection/sports med referral/physical therapy if necessary.

## 2018-08-27 NOTE — Assessment & Plan Note (Signed)
Patient complains of rhinorrhea, sneezing for the past three months that has been unresponsive to nasal corticosteroids.  States she took a pill in previous years that worked well but she does not remember.  When shown pictures of 2nd gen oral antihistamines she thinks it may have been allegra.  Does not appear to be bacterial sinusitis based on history and physical.    - continue flonase - added allegra 60mg  BID - f/u one month

## 2018-08-27 NOTE — Patient Instructions (Signed)
For your runny nose, you likely have allergies.  Continue the flonase that you are already taking and also start taking the allegra that I prescribed to you.  You can also get this medication over the counter.  Take it as prescribed, one pill twice a day.    For your hip and back pain I'd like to increase the dose of the meloxicam that you were on and also get some x rays to see if your pain has progressed.  When you follow up in one month we can consider giving you an injection in your hips to see if that relieves the pain or we could refer you to the sports medicine specialists.   Please follow up in one month.   Clemetine Marker, MD

## 2018-08-27 NOTE — Progress Notes (Signed)
   [facility] Phone: [phone]   cc: sneezing, hip pain  Subjective:  Sneezing: 3 months of sneezing and runny nose.  She uses flonase every morning, two sprays but it is not helping. This is the only medicine she is using.   No thick green mucous, it is mostly clear.  Pressure is mostly in her forehead. Feels a lot of pressure in her head. Sometimes she will sneeze for an hour when she gets up to go to the bathroom in the morning, gets better during the day but is also bad at night.  Nose is running constantly.  No cough.  She feels like she has a tickle in her throat but her throat is not sore. No changes in medication, food, no new pets.   Hip and back pain: this is a chronic problem for her.  The meloxicam she was prescribed last time was not working for her. She continues to take tylenol.  She has tried exercising but this hasn't helped and makes the pain worse.  It gets worse throughout the day and at the end of work.  No paresthesia or numbness.     ROS: See HPI for pertinent positives and negatives  Past Medical History  Family history reviewed for today's visit. No changes.  Social history- patient is a non smoker  Objective: BP (!) 110/54   Pulse 91   Temp 97.7 F (36.5 C) (Oral)   Wt 163 lb 8 oz (74.2 kg)   SpO2 93%   BMI 28.96 kg/m  Gen: NAD, alert and oriented, cooperative with exam. Sign language interpreter is in the room.  HEENT: NCAT, no nasal mucus or erythema seen in nares.  No oropharyngeal erythema or exudates. No sinus tenderness to palpation.  Neck: FROM, supple, no masses CV: normal rate, regular rhythm. No murmurs, no rubs.  Resp: LCTAB, no wheezes, crackles. normal work of breathing Msk: gait is slowed and deliberate. No patellar crepitus.  TTP on trochanter bilaterally.  Normal abductor strength bilaterally. Hip tenderness of the bottom hip felt when patient lays on her side. This is present bilaterally.  Skin: No rashes, no  lesions Psych: Appropriate behavior  Assessment/Plan: Seasonal allergies Patient complains of rhinorrhea, sneezing for the past three months that has been unresponsive to nasal corticosteroids.  States she took a pill in previous years that worked well but she does not remember.  When shown pictures of 2nd gen oral antihistamines she thinks it may have been allegra.  Does not appear to be bacterial sinusitis based on history and physical.    - continue flonase - added allegra 60mg  BID - f/u one month  Osteoarthritis involving multiple joints on both sides of body Continued hip and lumbar spine pain. meloxicam 7.5mg  did not help her so she stopped taking it.  Exercise has not helped. She is still taking tylenol.  Patient did endorse hip tenderness to palpation. Need to consider trochanteric bursitis if pain does not improve and/or x rays do not show signs of arthritis.  Previous x ray in 2016 showed mild lumbar degenerative changes and normal pelvis.   - repeat lumbar and pelvis x rays - increase meloxicam to 15mg  daily - follow up in approximately one month with PCP. Consider steroid injection/sports med referral/physical therapy if necessary.      Clemetine Marker, MD PGY-1

## 2018-08-29 DIAGNOSIS — E113512 Type 2 diabetes mellitus with proliferative diabetic retinopathy with macular edema, left eye: Secondary | ICD-10-CM | POA: Diagnosis not present

## 2018-08-29 DIAGNOSIS — H35371 Puckering of macula, right eye: Secondary | ICD-10-CM | POA: Diagnosis not present

## 2018-08-29 DIAGNOSIS — E113591 Type 2 diabetes mellitus with proliferative diabetic retinopathy without macular edema, right eye: Secondary | ICD-10-CM | POA: Diagnosis not present

## 2018-08-29 DIAGNOSIS — H4312 Vitreous hemorrhage, left eye: Secondary | ICD-10-CM | POA: Diagnosis not present

## 2018-09-02 ENCOUNTER — Encounter (HOSPITAL_BASED_OUTPATIENT_CLINIC_OR_DEPARTMENT_OTHER): Payer: Self-pay

## 2018-09-02 DIAGNOSIS — R12 Heartburn: Secondary | ICD-10-CM | POA: Diagnosis not present

## 2018-09-02 DIAGNOSIS — K2900 Acute gastritis without bleeding: Secondary | ICD-10-CM | POA: Diagnosis not present

## 2018-09-02 DIAGNOSIS — K2930 Chronic superficial gastritis without bleeding: Secondary | ICD-10-CM | POA: Diagnosis not present

## 2018-09-02 DIAGNOSIS — K219 Gastro-esophageal reflux disease without esophagitis: Secondary | ICD-10-CM | POA: Diagnosis not present

## 2018-09-02 DIAGNOSIS — Z1211 Encounter for screening for malignant neoplasm of colon: Secondary | ICD-10-CM | POA: Diagnosis not present

## 2018-09-02 DIAGNOSIS — K640 First degree hemorrhoids: Secondary | ICD-10-CM | POA: Diagnosis not present

## 2018-09-05 DIAGNOSIS — K2930 Chronic superficial gastritis without bleeding: Secondary | ICD-10-CM | POA: Diagnosis not present

## 2018-09-07 ENCOUNTER — Other Ambulatory Visit: Payer: Self-pay | Admitting: Student in an Organized Health Care Education/Training Program

## 2018-09-07 DIAGNOSIS — I10 Essential (primary) hypertension: Secondary | ICD-10-CM

## 2018-09-09 NOTE — Telephone Encounter (Signed)
I received a refill request for HCTZ 12.5 mg daily but based on chart review it appears the patient was last charted as being on HCTZ 25 mg daily.  Can you please clarify the patient's HCTZ dose so that I can refill it appropriately?

## 2018-09-10 ENCOUNTER — Ambulatory Visit
Admission: RE | Admit: 2018-09-10 | Discharge: 2018-09-10 | Disposition: A | Discharge status: Home / Self Care | Payer: Medicare Other | Source: Ambulatory Visit | Attending: Family Medicine | Admitting: Family Medicine

## 2018-09-10 DIAGNOSIS — M48061 Spinal stenosis, lumbar region without neurogenic claudication: Secondary | ICD-10-CM | POA: Diagnosis not present

## 2018-09-10 DIAGNOSIS — M159 Polyosteoarthritis, unspecified: Secondary | ICD-10-CM

## 2018-09-10 DIAGNOSIS — M533 Sacrococcygeal disorders, not elsewhere classified: Secondary | ICD-10-CM | POA: Diagnosis not present

## 2018-09-10 DIAGNOSIS — M47816 Spondylosis without myelopathy or radiculopathy, lumbar region: Secondary | ICD-10-CM | POA: Diagnosis not present

## 2018-09-11 NOTE — Telephone Encounter (Signed)
Pt called to check status.  Her bottle at home is 25mg .  She states that she was told last fall to only take 12.5 (although I see no documentation).  She has been out of this medication x 8 days.  Fleeger, Salome Spotted, CMA

## 2018-09-23 DIAGNOSIS — E113512 Type 2 diabetes mellitus with proliferative diabetic retinopathy with macular edema, left eye: Secondary | ICD-10-CM | POA: Diagnosis not present

## 2018-09-25 ENCOUNTER — Ambulatory Visit: Payer: Self-pay | Admitting: Pulmonary Disease

## 2018-09-27 ENCOUNTER — Encounter (HOSPITAL_BASED_OUTPATIENT_CLINIC_OR_DEPARTMENT_OTHER): Payer: Self-pay

## 2018-10-02 ENCOUNTER — Other Ambulatory Visit: Payer: Self-pay

## 2018-10-02 ENCOUNTER — Ambulatory Visit: Payer: Self-pay | Admitting: Student in an Organized Health Care Education/Training Program

## 2018-10-02 ENCOUNTER — Telehealth (INDEPENDENT_AMBULATORY_CARE_PROVIDER_SITE_OTHER): Payer: Medicare Other | Admitting: Family Medicine

## 2018-10-02 DIAGNOSIS — D509 Iron deficiency anemia, unspecified: Secondary | ICD-10-CM | POA: Diagnosis not present

## 2018-10-02 DIAGNOSIS — M545 Low back pain, unspecified: Secondary | ICD-10-CM

## 2018-10-02 NOTE — Progress Notes (Signed)
[facility] Tele[facility] Visit  Patient consented to have visit conducted via telephone.  Encounter participants: Patient: Alicia [name]  Provider: Annabell Sabal  Others (if applicable): Sign interpreter used for entire encounter  Chief Complaint: back and hip pain  HPI:  Patient has several month history of hip and back pain.  Describes dull aching lower back as well as generalized aching around waist bilaterally.  Non-focalized either hip.  She was taking Tylenol and meloxicam with some relief of her pain.  However she reports that a physician recently told her to stop the meloxicam.  She is only been taking Tylenol for the past 2 to 3 weeks.  She has had increasing pain secondary to this.  No falls.  No weakness lower extremities.  Pain is worse with moving and better with rest.  No bladder or bowel incontinence.  No lightheadedness.  No chest pain.  No fevers or chills.  She had colonoscopy last month.  Told it was reportedly normal.  She has had no melena or hematochezia.  ROS: As above  Pertinent PMHx: Deaf, history of iron deficiency anemia  Exam:  Respiratory: Good strong voice.  Speaking in full sentences.   Neuro: She is alert and oriented x4.  Assessment/Plan:   1.  Back pain: -Ongoing.  I reviewed her chest x-rays with her.  She has some mild degenerative disease. -However her pain is persisted and I am unclear if this is related to degenerative disease or something else. -She has history of slightly elevated calcium over the past several months.  Also iron deficiency anemia from no discernible reason.  She has had no bleeding.  See below.  2.  Iron deficiency anemia: -With negative colonoscopy last month.  No other source of bleeding. -This was last noted on labs at this time last year.  She has not had repeat lab checks since that time. -Recommend she come in to be evaluated this week for through her back pain to see if she can take something  besides Tylenol for pain and have a physical exam.  She also needs to have repeat of her lab.   -There is a question of elevated calcium as well as iron deficiency anemia which may be playing into her back pain although this is much less likely.  Either way she should have a recheck and physical exam.  Patient would make an appointment to be seen later this week.  Continue Tylenol in the meantime.  No red flags.  Time spent on phone with patient: 20 minutes

## 2018-10-08 ENCOUNTER — Other Ambulatory Visit: Payer: Self-pay

## 2018-10-08 ENCOUNTER — Encounter: Payer: Self-pay | Admitting: Family Medicine

## 2018-10-08 ENCOUNTER — Ambulatory Visit (INDEPENDENT_AMBULATORY_CARE_PROVIDER_SITE_OTHER): Payer: Medicare Other | Admitting: Family Medicine

## 2018-10-08 VITALS — BP 140/64 | HR 95 | Temp 98.4°F | Wt 158.4 lb

## 2018-10-08 DIAGNOSIS — M159 Polyosteoarthritis, unspecified: Secondary | ICD-10-CM | POA: Diagnosis not present

## 2018-10-08 DIAGNOSIS — D509 Iron deficiency anemia, unspecified: Secondary | ICD-10-CM | POA: Diagnosis not present

## 2018-10-08 MED ORDER — FLUTICASONE PROPIONATE 50 MCG/ACT NA SUSP: 2.0000 | Freq: Every day | NASAL | 0 refills | Status: DC

## 2018-10-08 NOTE — Progress Notes (Signed)
    Subjective:  Alicia [name] is a 63 y.o. female who presents to the [facility] today with a chief complaint of chronic back pain.   HPI: Back pain She reports continued back pain for over 1 year.  Her back pain has not changed in character although she does think it is increased in severity lately.  She describes the pain as a shooting pain starting in her lower back radiating around her hips bilaterally and then shooting downward toward her knees.  It does not reach her feet.  This pain is especially intense during activities that require bending and squatting.  She also and noted tenderness to palpation of her hips bilaterally.  She is currently taking Tylenol 500 mg every 6 hours in addition to meloxicam 15 mg daily and gabapentin 400 mg twice daily.  Recent hip and lumbar imaging show no appreciable degenerative changes of the hips, sclerosis of the left SI joint, degenerative changes of the lumbar spine from L2-L4.  Anemia She reports recent lower and upper endoscopy which were ultimately unrevealing for signs of bleeding.  She was advised by her gastroenterologist to avoid NSAIDs.  This is led to decreased use of meloxicam.  She reports that she had been taking oral iron supplements but stopped because it made her constipated.  She denies any recent hematochezia, melena, hematemesis, vaginal bleeding.  Hypercalcemia Recent elevated calcium to 10.6.  No abdominal pain, constipation.  Chief Complaint noted Review of Symptoms - see HPI PMH - Smoking status noted.    Objective:  Physical Exam: BP 140/64   Pulse 95   Temp 98.4 F (36.9 C) (Oral)   Wt 158 lb 6 oz (71.8 kg)   SpO2 99%   BMI 28.05 kg/m    Gen: NAD, resting comfortably.  He will assess stand from seated position, walk around the table and step up to the exam table with mild pain. MSK: Straight leg raise negative bilaterally.  Mild tenderness to palpation over trochanters bilaterally.  Mild tenderness with external hip  rotation bilaterally.  Tenderness to palpation of lumbar vertebrae and SI joints bilaterally. No results found for this or any previous visit (from the past 72 hour(s)).   Assessment/Plan:  Osteoarthritis involving multiple joints on both sides of body Current back/hip pain appears chronic.  No acute changes.  We will continue to medicate with Tylenol, meloxicam, gabapentin.  Physical therapy referral placed.  Handouts for home physical therapy given. -Increase Tylenol to 500 mg every 4 hours as needed -Decrease meloxicam to once daily for severe pain only -Continue daily physical therapy at home -Follow-up physical therapy referral  Hypercalcemia -Follow-up PTH and calcium  Iron deficiency anemia No documentation of upper endoscopy but negative per patient report. -Follow-up iron labs -Consider restarting oral supplementation

## 2018-10-08 NOTE — Assessment & Plan Note (Signed)
Current back/hip pain appears chronic.  No acute changes.  We will continue to medicate with Tylenol, meloxicam, gabapentin.  Physical therapy referral placed.  Handouts for home physical therapy given. -Increase Tylenol to 500 mg every 4 hours as needed -Decrease meloxicam to once daily for severe pain only -Continue daily physical therapy at home -Follow-up physical therapy referral

## 2018-10-08 NOTE — Assessment & Plan Note (Addendum)
No documentation of upper endoscopy but negative per patient report. -Follow-up iron labs -Consider restarting oral supplementation

## 2018-10-08 NOTE — Patient Instructions (Addendum)
For your back pain:  1) increase your tylenol to 500 mg every 4 hours as needed  2) use meloxicam sparingly  3) do the exercised that I gave you daily  4) you may get a call from physical therapy to schedule an appointment  For your anemia:  1) I will let you know if you have any laboratory abnormalities

## 2018-10-08 NOTE — Assessment & Plan Note (Signed)
-  Follow-up PTH and calcium

## 2018-10-09 ENCOUNTER — Other Ambulatory Visit: Payer: Self-pay | Admitting: Family Medicine

## 2018-10-09 DIAGNOSIS — D649 Anemia, unspecified: Secondary | ICD-10-CM

## 2018-10-09 DIAGNOSIS — K5900 Constipation, unspecified: Secondary | ICD-10-CM

## 2018-10-09 LAB — IRON,TIBC AND FERRITIN PANEL
Ferritin: 7 ng/mL — ABNORMAL LOW (ref 15–150)
Iron Saturation: 5 % — CL (ref 15–55)
Iron: 14 ug/dL — ABNORMAL LOW (ref 27–139)
Total Iron Binding Capacity: 298 ug/dL (ref 250–450)
UIBC: 284 ug/dL (ref 118–369)

## 2018-10-09 LAB — PTH, INTACT AND CALCIUM
Calcium: 10 mg/dL (ref 8.7–10.3)
PTH: 66 pg/mL — ABNORMAL HIGH (ref 15–65)

## 2018-10-09 MED ORDER — IRON 325 (65 FE) MG PO TABS: 1.0000 | ORAL_TABLET | Freq: Every day | ORAL | 0 refills | Status: DC

## 2018-10-09 MED ORDER — SENNOSIDES-DOCUSATE SODIUM 8.6-50 MG PO TABS: 1.0000 | ORAL_TABLET | Freq: Every day | ORAL | 0 refills | Status: DC | PRN

## 2018-10-09 NOTE — Progress Notes (Signed)
Your iron level continues to be very low.  You need to continue taking iron supplements at least once daily. I sent iron supplements and a stool softener to your pharmacy.  Please call the clinic if you have questions. Please schedule an appointment in 3 months to recheck your levels.

## 2018-10-18 ENCOUNTER — Other Ambulatory Visit: Payer: Self-pay | Admitting: Family Medicine

## 2018-10-18 DIAGNOSIS — M159 Polyosteoarthritis, unspecified: Secondary | ICD-10-CM

## 2018-10-21 DIAGNOSIS — E113512 Type 2 diabetes mellitus with proliferative diabetic retinopathy with macular edema, left eye: Secondary | ICD-10-CM | POA: Diagnosis not present

## 2018-10-21 DIAGNOSIS — E113591 Type 2 diabetes mellitus with proliferative diabetic retinopathy without macular edema, right eye: Secondary | ICD-10-CM | POA: Diagnosis not present

## 2018-10-30 ENCOUNTER — Other Ambulatory Visit: Payer: Self-pay | Admitting: Family Medicine

## 2018-11-01 ENCOUNTER — Other Ambulatory Visit: Payer: Self-pay

## 2018-11-01 DIAGNOSIS — Z794 Long term (current) use of insulin: Principal | ICD-10-CM

## 2018-11-01 DIAGNOSIS — E119 Type 2 diabetes mellitus without complications: Secondary | ICD-10-CM

## 2018-11-02 MED ORDER — INSULIN PEN NEEDLE 31G X 5 MM MISC: 1.0000 | Freq: Once | 11 refills | Status: DC | PRN

## 2018-11-13 ENCOUNTER — Other Ambulatory Visit: Payer: Self-pay | Admitting: Family Medicine

## 2018-11-13 ENCOUNTER — Encounter: Payer: Self-pay | Admitting: Family Medicine

## 2018-11-13 ENCOUNTER — Telehealth: Payer: Self-pay | Admitting: Family Medicine

## 2018-11-13 ENCOUNTER — Telehealth: Payer: Self-pay

## 2018-11-13 DIAGNOSIS — M159 Polyosteoarthritis, unspecified: Secondary | ICD-10-CM

## 2018-11-13 NOTE — Telephone Encounter (Signed)
Two attempts were make to call pt. Message was left through her messaging service to call the office for lab results.  Alicia Haymaker, MD

## 2018-11-13 NOTE — Telephone Encounter (Signed)
Pt calls nurse line requesting labs from recent visit. Pt informed her blood count is low, due to lack of iron. Pt informed of medication to her pharmacy, and directions for use were given. Pt had no further questions at this time.

## 2018-11-21 ENCOUNTER — Other Ambulatory Visit: Payer: Self-pay | Admitting: Student in an Organized Health Care Education/Training Program

## 2018-11-21 DIAGNOSIS — M792 Neuralgia and neuritis, unspecified: Secondary | ICD-10-CM

## 2018-11-21 DIAGNOSIS — E113512 Type 2 diabetes mellitus with proliferative diabetic retinopathy with macular edema, left eye: Secondary | ICD-10-CM | POA: Diagnosis not present

## 2018-11-27 ENCOUNTER — Other Ambulatory Visit: Payer: Self-pay

## 2018-11-27 ENCOUNTER — Ambulatory Visit: Payer: Medicare Other | Attending: Family Medicine | Admitting: Physical Therapy

## 2018-11-27 ENCOUNTER — Encounter: Payer: Self-pay | Admitting: Physical Therapy

## 2018-11-27 DIAGNOSIS — M6281 Muscle weakness (generalized): Secondary | ICD-10-CM | POA: Insufficient documentation

## 2018-11-27 DIAGNOSIS — M545 Low back pain, unspecified: Secondary | ICD-10-CM

## 2018-11-27 DIAGNOSIS — G8929 Other chronic pain: Secondary | ICD-10-CM

## 2018-11-27 NOTE — Patient Instructions (Signed)
Access Code: [email or web address]  URL: [email or web address]  Date: 11/27/2018  Prepared by: Selinda Eon   Exercises  ITB Stretch at [employer] - 2 reps - 1 sets - 30s hold - 2x daily - 7x weekly  Supine Posterior Pelvic Tilt - 5 reps - 1 sets - 3x daily - 7x weekly

## 2018-11-27 NOTE — Therapy (Signed)
[place], [place], [address] Phone: [phone]   Fax:  [phone]  Physical Therapy Evaluation  Patient Details  Name: Alicia [name] MRN: [id number] Date of Birth: 03/27/1956 Referring Provider (PT): Zenia Resides, MD   Encounter Date: 11/27/2018  PT End of Session - 11/27/18 1031    Visit Number  1    Number of Visits  13    Date for PT Re-Evaluation  01/10/19    Authorization Type  MCR- KX at visit 15   PN at visit 10    PT Start Time  1032    PT Stop Time  1114    PT Time Calculation (min)  42 min    Activity Tolerance  Patient tolerated treatment well    Behavior During Therapy  [facility] for tasks assessed/performed       Past Medical History:  Diagnosis Date  . Anemia   . Complete deafness    Meningitis at age 54  . Deaf   . Diabetes mellitus   . Diabetic neuropathy ([place])   . Ganglion cyst 06/22/2011  . Gastroparesis   . GERD (gastroesophageal reflux disease)   . H/O: C-section   . Hyperlipidemia   . Hypertension   . Neuromuscular disorder ([place])    diabetic neuropathy  . PSVT (paroxysmal supraventricular tachycardia) ([place]) 11/09/2017   Event monitor 09/14/2017 - Predominantly sinus rhythm with episodes of narrow-complex tachycardia suggestive of paroxysmal supraventricular tachycardia.  . S/P appy     Past Surgical History:  Procedure Laterality Date  . APPENDECTOMY    . cardiolyte EF 77%, no ischemia in 2006  2006  . CARPAL TUNNEL RELEASE Right 04/20/2015   Procedure: RIGHT CARPAL TUNNEL RELEASE;  Surgeon: Daryll Brod, MD;  Location: [place];  Service: Orthopedics;  Laterality: Right;  . CARPAL TUNNEL RELEASE Left 11/04/2015   Procedure: CARPAL TUNNEL RELEASE;  Surgeon: Daryll Brod, MD;  Location: [facility] [facility];  Service: Orthopedics;  Laterality: Left;  . CESAREAN SECTION    . OPEN REDUCTION INTERNAL FIXATION (ORIF) DISTAL RADIAL FRACTURE Left 11/04/2015   Procedure: OPEN REDUCTION INTERNAL FIXATION (ORIF) LEFT DISTAL RADIAL FRACTURE POSSIBLE BONE GRAFT;  Surgeon: Daryll Brod, MD;  Location: [facility];  Service: Orthopedics;  Laterality: Left;  . TRIGGER FINGER RELEASE Right 04/20/2015   Procedure: RELEASE TRIGGER FINGER/A-1 PULLEY RIGHT MIDDLE FINGER,RIGHT RING FINGER;  Surgeon: Daryll Brod, MD;  Location: [facility];  Service: Orthopedics;  Laterality: Right;  . TUBAL LIGATION    . ULNAR NERVE TRANSPOSITION Right 04/20/2015   Procedure: RIGHT ULNAR NERVE DECOMPRESSION;  Surgeon: Daryll Brod, MD;  Location: [facility];  Service: Orthopedics;  Laterality: Right;  . UTERINE FIBROID EMBOLIZATION  2007  . WRIST SURGERY     Cyst removed on left  . WRIST SURGERY Right 1985   tendon repair R wrist    There were no vitals filed for this visit.   Subjective Assessment - 11/27/18 1035    Subjective  Mostly lower back and hip area, sometimes lower abdominal pain that feels like it is coming from my back. Denies N/T, just sharp pain off and on since 2018. Definitely every morning first thing and evenings after a long day. I try to prop my feet and knees which sometimes helps.     How long can you stand comfortably?  constant on and off through the day    Patient Stated Goals  decrease  pain, do daily activities, driving    Currently in Pain?  Yes    Pain Score  5     Pain Location  Back    Pain Orientation  Lower    Pain Descriptors / Indicators  Aching    Pain Type  Chronic pain    Pain Radiating Towards  lower abdomem    Aggravating Factors   AM pain, moving around too much    Pain Relieving Factors  rest         [facility] PT Assessment - 11/27/18 0001      Assessment   Medical Diagnosis  Osteoarthritis involving multiple joints on both sides of body    Referring Provider (PT)  Hensel, Jamal Collin, MD    Hand Dominance  Right      Precautions   Precautions  None      Restrictions   Weight Bearing  Restrictions  No      Balance Screen   Has the patient fallen in the past 6 months  No      [place] residence      Prior Function   Vocation Requirements  daycare      Sensation   Additional Comments  [facility]      Posture/Postural Control   Posture Comments  decreased curve, posterior pelvic tilt      ROM / Strength   AROM / PROM / Strength  AROM;Strength      AROM   AROM Assessment Site  Lumbar    Lumbar Flexion  WFL, pain upon return to standing    Lumbar Extension  WFL    Lumbar - Right Side Bend  WFL, painful    Lumbar - Left Side Bend  WFL, painful      Strength   Overall Strength Comments  gross LE strength 3+/5 with pain      Palpation   Palpation comment  TTP bil gr trochanter & ASIS                Objective measurements completed on examination: See above findings.      [facility] Adult PT Treatment/Exercise - 11/27/18 0001      Exercises   Exercises  Knee/Hip      Knee/Hip Exercises: Stretches   ITB Stretch  Both;30 seconds    ITB Stretch Limitations  at wall      Knee/Hip Exercises: Supine   Other Supine Knee/Hip Exercises  abd squeeze in hooklying with pelvic tilt      Manual Therapy   Manual therapy comments  edu on self STM with tennis ball             PT Education - 11/27/18 1615    Education Details  anatomy of condition, POC, HEP, exercise form/rationale    Person(s) Educated  Patient    Methods  Explanation;Demonstration;Tactile cues;Verbal cues;Handout    Comprehension  Verbalized understanding;Need further instruction;Returned demonstration;Verbal cues required;Tactile cues required       PT Short Term Goals - 11/27/18 1618      PT SHORT TERM GOAL #1   Title  Pt will be able to demonstrate proper form with short term HEP as it has been established    Baseline  began at eval    Time  3    Period  Weeks    Status  New    Target Date  12/18/18      PT SHORT TERM GOAL #2  Title  Pt  will be able to adjust her posture in a seated position & while driving to support lumbar spine    Baseline  will continue to education    Time  3    Period  Weeks    Status  New    Target Date  12/18/18        PT Long Term Goals - 11/27/18 1619      PT LONG TERM GOAL #1   Title  Pt will be able to perform bending and lifting without a spike of LBP upon return to stand    Baseline  significant pain at eval    Time  6    Period  Weeks    Status  New    Target Date  01/10/19      PT LONG TERM GOAL #2   Title  Pt will be able to get out of bed pain <=2/10    Baseline  severe in the AM    Time  6    Period  Weeks    Status  New    Target Date  01/10/19      PT LONG TERM GOAL #3   Title  pt will demo gross 5/5 lumbopelvic strength    Baseline  see flowsheet    Time  6    Period  Weeks    Status  New    Target Date  01/10/19      PT LONG TERM GOAL #4   Title  Pt will be able to sleep comfortably    Baseline  frequent shifting at eval    Time  6    Period  Weeks    Status  New    Target Date  01/10/19             Plan - 11/27/18 1337    Clinical Impression Statement  Pt presents to PT with complaints of LBP that wraps around to lower abdomen just above her hips. Xrays do indicate a calcified fibroid- pt reports she has required fibroid removals in the past and has not seen an OBGYN in 3 years, I suggested scheduling a f/u for annual well-check and discuss the possibility that this may be adding to pain. Trigger points in bilateral TFL with tightness and TTP along ITB. No s/s to indicate radicular symptoms. Pt reports she has been packing to move over the last week and may have overdone it. Pt will benefit from skilled PT in order to improve gross lumbopelvic stability and function to decrease pain and reach long term goals.     Personal Factors and Comorbidities  Comorbidity 1;Comorbidity 2    Comorbidities  diabetic neuropathy, calcified fibroid     Examination-Activity Limitations  Bed Mobility;Squat;Lift;Bend;Stand;Sleep;Sit    Examination-Participation Restrictions  Driving;Cleaning;Community Activity    Stability/Clinical Decision Making  Evolving/Moderate complexity    Clinical Decision Making  Moderate    Rehab Potential  Good    PT Frequency  2x / week    PT Duration  6 weeks    PT Treatment/Interventions  ADLs/Self Care Home Management;Cryotherapy;Traction;Moist Heat;Electrical Stimulation;Functional mobility training;Balance training;Therapeutic exercise;Therapeutic activities;Patient/family education;Manual techniques;Dry needling;Passive range of motion;Taping;Joint Manipulations;Spinal Manipulations;Iontophoresis 4mg /ml Dexamethasone    PT Next Visit Plan  STM/DN to hip abductors & ITB, core contraction, hip flexor stretching    PT Home Exercise Plan  tennis ball STM, ITB stretch, PPT with iso abd in hooklying    Consulted and Agree with Plan of Care  Patient       Patient will benefit from skilled therapeutic intervention in order to improve the following deficits and impairments:  Improper body mechanics, Pain, Postural dysfunction, Increased muscle spasms, Decreased activity tolerance, Decreased strength, Difficulty walking  Visit Diagnosis: Chronic bilateral low back pain without sciatica - Plan: PT plan of care cert/re-cert  Muscle weakness (generalized) - Plan: PT plan of care cert/re-cert     Problem List Patient Active Problem List   Diagnosis Date Noted  . Hypercalcemia 10/08/2018  . Seasonal allergies 08/27/2018  . Pre-op exam 06/11/2018  . Osteoarthritis involving multiple joints on both sides of body 05/25/2018  . Statin intolerance 12/26/2017  . PSVT (paroxysmal supraventricular tachycardia) ([place]) 11/09/2017  . Hyperlipidemia 08/30/2017  . OSA (obstructive sleep apnea) 08/31/2016  . Seizure ([place]) 06/08/2014  . Iron deficiency anemia 07/01/2012  . Diabetic retinopathy ([place]) 07/21/2011  . Asthma  04/04/2010  . GERD 12/30/2007  . ARTHRITIS, KNEE 09/17/2007  . Diabetic polyneuropathy ([place]) 08/30/2006  . Type 2 diabetes mellitus without complication, without long-term current use of insulin ([place]) 08/30/2006  . HYPERCHOLESTEROLEMIA 08/30/2006  . HEARING LOSS NOS OR DEAFNESS 08/30/2006    Jessah Danser C. Coni Homesley PT, DPT 11/27/18 4:25 PM   [facility] [address] [place], [place], [address] Phone: [phone]   Fax:  [phone]  Name: MIRNA SUTCLIFFE MRN: [id number] Date of Birth: [birth year]

## 2018-12-03 ENCOUNTER — Other Ambulatory Visit: Payer: Self-pay

## 2018-12-03 ENCOUNTER — Ambulatory Visit: Payer: Medicare Other | Attending: Family Medicine | Admitting: Physical Therapy

## 2018-12-03 ENCOUNTER — Encounter: Payer: Self-pay | Admitting: Physical Therapy

## 2018-12-03 DIAGNOSIS — M545 Low back pain: Secondary | ICD-10-CM | POA: Insufficient documentation

## 2018-12-03 DIAGNOSIS — G8929 Other chronic pain: Secondary | ICD-10-CM | POA: Insufficient documentation

## 2018-12-03 DIAGNOSIS — M6281 Muscle weakness (generalized): Secondary | ICD-10-CM | POA: Insufficient documentation

## 2018-12-03 NOTE — Therapy (Signed)
[place], [place], [address] Phone: [phone]   Fax:  [phone]  Physical Therapy Treatment  Patient Details  Name: Alicia [name] MRN: [id number] Date of Birth: December 20, 1955 Referring Provider (PT): Zenia Resides, MD   Encounter Date: 12/03/2018  PT End of Session - 12/03/18 1308    Visit Number  2    Number of Visits  13    Date for PT Re-Evaluation  01/10/19    Authorization Type  MCR- KX at visit 15   PN at visit 10    PT Start Time  1303    PT Stop Time  1344    PT Time Calculation (min)  41 min    Activity Tolerance  Patient tolerated treatment well    Behavior During Therapy  [facility] for tasks assessed/performed       Past Medical History:  Diagnosis Date  . Anemia   . Complete deafness    Meningitis at age 50  . Deaf   . Diabetes mellitus   . Diabetic neuropathy ([place])   . Ganglion cyst 06/22/2011  . Gastroparesis   . GERD (gastroesophageal reflux disease)   . H/O: C-section   . Hyperlipidemia   . Hypertension   . Neuromuscular disorder ([place])    diabetic neuropathy  . PSVT (paroxysmal supraventricular tachycardia) ([place]) 11/09/2017   Event monitor 09/14/2017 - Predominantly sinus rhythm with episodes of narrow-complex tachycardia suggestive of paroxysmal supraventricular tachycardia.  . S/P appy     Past Surgical History:  Procedure Laterality Date  . APPENDECTOMY    . cardiolyte EF 77%, no ischemia in 2006  2006  . CARPAL TUNNEL RELEASE Right 04/20/2015   Procedure: RIGHT CARPAL TUNNEL RELEASE;  Surgeon: Daryll Brod, MD;  Location: [place];  Service: Orthopedics;  Laterality: Right;  . CARPAL TUNNEL RELEASE Left 11/04/2015   Procedure: CARPAL TUNNEL RELEASE;  Surgeon: Daryll Brod, MD;  Location: [place];  Service: Orthopedics;  Laterality: Left;  . CESAREAN SECTION    . OPEN REDUCTION INTERNAL FIXATION (ORIF) DISTAL RADIAL FRACTURE Left 11/04/2015    Procedure: OPEN REDUCTION INTERNAL FIXATION (ORIF) LEFT DISTAL RADIAL FRACTURE POSSIBLE BONE GRAFT;  Surgeon: Daryll Brod, MD;  Location: [place];  Service: Orthopedics;  Laterality: Left;  . TRIGGER FINGER RELEASE Right 04/20/2015   Procedure: RELEASE TRIGGER FINGER/A-1 PULLEY RIGHT MIDDLE FINGER,RIGHT RING FINGER;  Surgeon: Daryll Brod, MD;  Location: [place];  Service: Orthopedics;  Laterality: Right;  . TUBAL LIGATION    . ULNAR NERVE TRANSPOSITION Right 04/20/2015   Procedure: RIGHT ULNAR NERVE DECOMPRESSION;  Surgeon: Daryll Brod, MD;  Location: [place];  Service: Orthopedics;  Laterality: Right;  . UTERINE FIBROID EMBOLIZATION  2007  . WRIST SURGERY     Cyst removed on left  . WRIST SURGERY Right 1985   tendon repair R wrist    There were no vitals filed for this visit.  Subjective Assessment - 12/03/18 1305    Subjective  I have been exercising with my daughter a lot the last few days so my back hurts.     Patient Stated Goals  decrease pain, do daily activities, driving    Currently in Pain?  Yes    Pain Score  7     Pain Location  Back    Pain Orientation  Lower;Right;Left    Pain Descriptors / Indicators  Aching  [facility] Adult PT Treatment/Exercise - 12/03/18 0001      Knee/Hip Exercises: Stretches   Passive Hamstring Stretch  Both;30 seconds    Passive Hamstring Stretch Limitations  supine with strap    Piriformis Stretch  Both;30 seconds    Gastroc Stretch  Both;2 reps;30 seconds    Other Knee/Hip Stretches  lower trunk rotation      Knee/Hip Exercises: Aerobic   Nustep  5 min L5 UE & LE      Knee/Hip Exercises: Supine   Bridges with Ball Squeeze  15 reps;Strengthening      Manual Therapy   Manual Therapy  Soft tissue mobilization    Soft tissue mobilization  bilateral hip abductors, piriformis               PT Short Term Goals - 11/27/18 1618      PT SHORT TERM  GOAL #1   Title  Pt will be able to demonstrate proper form with short term HEP as it has been established    Baseline  began at eval    Time  3    Period  Weeks    Status  New    Target Date  12/18/18      PT SHORT TERM GOAL #2   Title  Pt will be able to adjust her posture in a seated position & while driving to support lumbar spine    Baseline  will continue to education    Time  3    Period  Weeks    Status  New    Target Date  12/18/18        PT Long Term Goals - 11/27/18 1619      PT LONG TERM GOAL #1   Title  Pt will be able to perform bending and lifting without a spike of LBP upon return to stand    Baseline  significant pain at eval    Time  6    Period  Weeks    Status  New    Target Date  01/10/19      PT LONG TERM GOAL #2   Title  Pt will be able to get out of bed pain <=2/10    Baseline  severe in the AM    Time  6    Period  Weeks    Status  New    Target Date  01/10/19      PT LONG TERM GOAL #3   Title  pt will demo gross 5/5 lumbopelvic strength    Baseline  see flowsheet    Time  6    Period  Weeks    Status  New    Target Date  01/10/19      PT LONG TERM GOAL #4   Title  Pt will be able to sleep comfortably    Baseline  frequent shifting at eval    Time  6    Period  Weeks    Status  New    Target Date  01/10/19            Plan - 12/03/18 1344    Clinical Impression Statement  Pt denied doing stretches regularly at home and was provided with a handout to do BID. Decreased TTP following manual therapy today but pt reported feeling about the same after treatment.     PT Treatment/Interventions  ADLs/Self Care Home Management;Cryotherapy;Traction;Moist Heat;Electrical Stimulation;Functional mobility training;Balance training;Therapeutic exercise;Therapeutic activities;Patient/family education;Manual techniques;Dry needling;Passive range of motion;Taping;Joint Manipulations;Spinal  Manipulations;Iontophoresis 4mg /ml Dexamethasone    PT Next  Visit Plan  core/glut strengthening, CKC- wall sits, SLS    PT Home Exercise Plan  tennis ball STM, ITB stretch, PPT with iso abd in hooklying; stretches: hamstrings, gastroc, figure4, LTR; bridge ball squeeze    Consulted and Agree with Plan of Care  Patient       Patient will benefit from skilled therapeutic intervention in order to improve the following deficits and impairments:  Improper body mechanics, Pain, Postural dysfunction, Increased muscle spasms, Decreased activity tolerance, Decreased strength, Difficulty walking  Visit Diagnosis: Chronic bilateral low back pain without sciatica  Muscle weakness (generalized)     Problem List Patient Active Problem List   Diagnosis Date Noted  . Hypercalcemia 10/08/2018  . Seasonal allergies 08/27/2018  . Pre-op exam 06/11/2018  . Osteoarthritis involving multiple joints on both sides of body 05/25/2018  . Statin intolerance 12/26/2017  . PSVT (paroxysmal supraventricular tachycardia) ([place]) 11/09/2017  . Hyperlipidemia 08/30/2017  . OSA (obstructive sleep apnea) 08/31/2016  . Seizure ([place]) 06/08/2014  . Iron deficiency anemia 07/01/2012  . Diabetic retinopathy ([place]) 07/21/2011  . Asthma 04/04/2010  . GERD 12/30/2007  . ARTHRITIS, KNEE 09/17/2007  . Diabetic polyneuropathy ([place]) 08/30/2006  . Type 2 diabetes mellitus without complication, without long-term current use of insulin ([place]) 08/30/2006  . HYPERCHOLESTEROLEMIA 08/30/2006  . HEARING LOSS NOS OR DEAFNESS 08/30/2006    Kree Rafter C. Ruqayya Ventress PT, DPT 12/03/18 1:50 PM   [facility] [address] [place], [place], [address] Phone: [phone]   Fax:  [phone]  Name: Alicia [name] MRN: [id number] Date of Birth: 11-30-1955

## 2018-12-05 ENCOUNTER — Ambulatory Visit: Payer: Medicare Other | Admitting: Physical Therapy

## 2018-12-05 ENCOUNTER — Other Ambulatory Visit: Payer: Self-pay

## 2018-12-05 DIAGNOSIS — G8929 Other chronic pain: Secondary | ICD-10-CM | POA: Diagnosis not present

## 2018-12-05 DIAGNOSIS — M6281 Muscle weakness (generalized): Secondary | ICD-10-CM | POA: Diagnosis not present

## 2018-12-05 DIAGNOSIS — M545 Low back pain: Secondary | ICD-10-CM | POA: Diagnosis not present

## 2018-12-05 NOTE — Therapy (Signed)
[place], [place], [address] Phone: [phone]   Fax:  [phone]  Physical Therapy Treatment  Patient Details  Name: Alicia [name] MRN: [id number] Date of Birth: 1955-09-19 Referring Provider (PT): Zenia Resides, MD   Encounter Date: 12/05/2018  PT End of Session - 12/05/18 1109    Visit Number  3    Number of Visits  13    Date for PT Re-Evaluation  01/10/19    Authorization Type  MCR- KX at visit 15   PN at visit 10    PT Start Time  1101    PT Stop Time  1146    PT Time Calculation (min)  45 min    Activity Tolerance  Patient tolerated treatment well    Behavior During Therapy  [facility] for tasks assessed/performed       Past Medical History:  Diagnosis Date  . Anemia   . Complete deafness    Meningitis at age 63  . Deaf   . Diabetes mellitus   . Diabetic neuropathy ([place])   . Ganglion cyst 06/22/2011  . Gastroparesis   . GERD (gastroesophageal reflux disease)   . H/O: C-section   . Hyperlipidemia   . Hypertension   . Neuromuscular disorder ([place])    diabetic neuropathy  . PSVT (paroxysmal supraventricular tachycardia) ([place]) 11/09/2017   Event monitor 09/14/2017 - Predominantly sinus rhythm with episodes of narrow-complex tachycardia suggestive of paroxysmal supraventricular tachycardia.  . S/P appy     Past Surgical History:  Procedure Laterality Date  . APPENDECTOMY    . cardiolyte EF 77%, no ischemia in 2006  2006  . CARPAL TUNNEL RELEASE Right 04/20/2015   Procedure: RIGHT CARPAL TUNNEL RELEASE;  Surgeon: Daryll Brod, MD;  Location: [place];  Service: Orthopedics;  Laterality: Right;  . CARPAL TUNNEL RELEASE Left 11/04/2015   Procedure: CARPAL TUNNEL RELEASE;  Surgeon: Daryll Brod, MD;  Location: [place];  Service: Orthopedics;  Laterality: Left;  . CESAREAN SECTION    . OPEN REDUCTION INTERNAL FIXATION (ORIF) DISTAL RADIAL FRACTURE Left 11/04/2015    Procedure: OPEN REDUCTION INTERNAL FIXATION (ORIF) LEFT DISTAL RADIAL FRACTURE POSSIBLE BONE GRAFT;  Surgeon: Daryll Brod, MD;  Location: [place];  Service: Orthopedics;  Laterality: Left;  . TRIGGER FINGER RELEASE Right 04/20/2015   Procedure: RELEASE TRIGGER FINGER/A-1 PULLEY RIGHT MIDDLE FINGER,RIGHT RING FINGER;  Surgeon: Daryll Brod, MD;  Location: [place];  Service: Orthopedics;  Laterality: Right;  . TUBAL LIGATION    . ULNAR NERVE TRANSPOSITION Right 04/20/2015   Procedure: RIGHT ULNAR NERVE DECOMPRESSION;  Surgeon: Daryll Brod, MD;  Location: [place];  Service: Orthopedics;  Laterality: Right;  . UTERINE FIBROID EMBOLIZATION  2007  . WRIST SURGERY     Cyst removed on left  . WRIST SURGERY Right 1985   tendon repair R wrist    There were no vitals filed for this visit.  Subjective Assessment - 12/05/18 1110    Subjective  The pain is going down some. yesterday morning did walk and stretches which made me hurt. Felt more of an ache. Denies sharp pain in back. Hips were sore after STM last visit.     Patient Stated Goals  decrease pain, do daily activities, driving    Currently in Pain?  Yes    Pain Score  5     Pain Location  Back    Pain Orientation  Lower;Right;Left  Pain Descriptors / Indicators  Aching    Pain Type  Chronic pain                       [facility] Adult PT Treatment/Exercise - 12/05/18 0001      Knee/Hip Exercises: Stretches   Passive Hamstring Stretch  Both;2 reps;30 seconds    Passive Hamstring Stretch Limitations  supine with strap    Piriformis Stretch  Both;2 reps;30 seconds      Knee/Hip Exercises: Aerobic   Nustep  5 min L6 UE & LE      Knee/Hip Exercises: Standing   Heel Raises  20 reps;2 sets    Heel Raises Limitations  cues to use core and decrease lumbar hyperext- altered to do in lunge    Wall Squat Limitations  rolling on physioball    Other Standing Knee Exercises  post  pelvic tilt at wall               PT Short Term Goals - 11/27/18 1618      PT SHORT TERM GOAL #1   Title  Pt will be able to demonstrate proper form with short term HEP as it has been established    Baseline  began at eval    Time  3    Period  Weeks    Status  New    Target Date  12/18/18      PT SHORT TERM GOAL #2   Title  Pt will be able to adjust her posture in a seated position & while driving to support lumbar spine    Baseline  will continue to education    Time  3    Period  Weeks    Status  New    Target Date  12/18/18        PT Long Term Goals - 11/27/18 1619      PT LONG TERM GOAL #1   Title  Pt will be able to perform bending and lifting without a spike of LBP upon return to stand    Baseline  significant pain at eval    Time  6    Period  Weeks    Status  New    Target Date  01/10/19      PT LONG TERM GOAL #2   Title  Pt will be able to get out of bed pain <=2/10    Baseline  severe in the AM    Time  6    Period  Weeks    Status  New    Target Date  01/10/19      PT LONG TERM GOAL #3   Title  pt will demo gross 5/5 lumbopelvic strength    Baseline  see flowsheet    Time  6    Period  Weeks    Status  New    Target Date  01/10/19      PT LONG TERM GOAL #4   Title  Pt will be able to sleep comfortably    Baseline  frequent shifting at eval    Time  6    Period  Weeks    Status  New    Target Date  01/10/19            Plan - 12/05/18 1152    Clinical Impression Statement  Heavy cuing required for exercises today. Altered heel raises to lunge position as she was leaning forward and arching her back  when on two feet creating LBP. soreness in hips as expected after last treatment.     PT Treatment/Interventions  ADLs/Self Care Home Management;Cryotherapy;Traction;Moist Heat;Electrical Stimulation;Functional mobility training;Balance training;Therapeutic exercise;Therapeutic activities;Patient/family education;Manual techniques;Dry  needling;Passive range of motion;Taping;Joint Manipulations;Spinal Manipulations;Iontophoresis 4mg /ml Dexamethasone    PT Next Visit Plan  core activation, continue with gastroc strengthening    PT Home Exercise Plan  tennis ball STM, ITB stretch, PPT with iso abd in hooklying; stretches: hamstrings, gastroc, figure4, LTR; bridge ball squeeze; wall sits, PPT at wall    Consulted and Agree with Plan of Care  Patient       Patient will benefit from skilled therapeutic intervention in order to improve the following deficits and impairments:  Improper body mechanics, Pain, Postural dysfunction, Increased muscle spasms, Decreased activity tolerance, Decreased strength, Difficulty walking  Visit Diagnosis: Chronic bilateral low back pain without sciatica  Muscle weakness (generalized)     Problem List Patient Active Problem List   Diagnosis Date Noted  . Hypercalcemia 10/08/2018  . Seasonal allergies 08/27/2018  . Pre-op exam 06/11/2018  . Osteoarthritis involving multiple joints on both sides of body 05/25/2018  . Statin intolerance 12/26/2017  . PSVT (paroxysmal supraventricular tachycardia) ([place]) 11/09/2017  . Hyperlipidemia 08/30/2017  . OSA (obstructive sleep apnea) 08/31/2016  . Seizure ([place]) 06/08/2014  . Iron deficiency anemia 07/01/2012  . Diabetic retinopathy ([place]) 07/21/2011  . Asthma 04/04/2010  . GERD 12/30/2007  . ARTHRITIS, KNEE 09/17/2007  . Diabetic polyneuropathy ([place]) 08/30/2006  . Type 2 diabetes mellitus without complication, without long-term current use of insulin ([place]) 08/30/2006  . HYPERCHOLESTEROLEMIA 08/30/2006  . HEARING LOSS NOS OR DEAFNESS 08/30/2006   Jaydien Panepinto C. Jacobo Moncrief PT, DPT 12/05/18 11:54 AM   [facility] [address] [place], [place], [address] Phone: [phone]   Fax:  [phone]  Name: HALIA FRANEY MRN: [id number] Date of Birth: August 02, 1955

## 2018-12-10 ENCOUNTER — Other Ambulatory Visit: Payer: Self-pay

## 2018-12-10 ENCOUNTER — Ambulatory Visit: Payer: Medicare Other | Admitting: Physical Therapy

## 2018-12-10 ENCOUNTER — Other Ambulatory Visit: Payer: Self-pay | Admitting: Student in an Organized Health Care Education/Training Program

## 2018-12-10 ENCOUNTER — Encounter: Payer: Self-pay | Admitting: Physical Therapy

## 2018-12-10 DIAGNOSIS — M6281 Muscle weakness (generalized): Secondary | ICD-10-CM

## 2018-12-10 DIAGNOSIS — G8929 Other chronic pain: Secondary | ICD-10-CM

## 2018-12-10 DIAGNOSIS — M545 Low back pain: Secondary | ICD-10-CM | POA: Diagnosis not present

## 2018-12-10 DIAGNOSIS — Z1231 Encounter for screening mammogram for malignant neoplasm of breast: Secondary | ICD-10-CM

## 2018-12-10 NOTE — Therapy (Signed)
[place], [place], [address] Phone: [phone]   Fax:  [phone]  Physical Therapy Treatment  Patient Details  Name: Alicia [name] MRN: [id number] Date of Birth: May 02, 1956 Referring Provider (PT): Zenia Resides, MD   Encounter Date: 12/10/2018  PT End of Session - 12/10/18 1300    Visit Number  4    Number of Visits  13    Date for PT Re-Evaluation  01/10/19    Authorization Type  MCR- KX at visit 15   PN at visit 10    PT Start Time  1301    PT Stop Time  1342    PT Time Calculation (min)  41 min    Activity Tolerance  Patient tolerated treatment well    Behavior During Therapy  [facility] for tasks assessed/performed       Past Medical History:  Diagnosis Date  . Anemia   . Complete deafness    Meningitis at age 57  . Deaf   . Diabetes mellitus   . Diabetic neuropathy ([place])   . Ganglion cyst 06/22/2011  . Gastroparesis   . GERD (gastroesophageal reflux disease)   . H/O: C-section   . Hyperlipidemia   . Hypertension   . Neuromuscular disorder ([place])    diabetic neuropathy  . PSVT (paroxysmal supraventricular tachycardia) ([place]) 11/09/2017   Event monitor 09/14/2017 - Predominantly sinus rhythm with episodes of narrow-complex tachycardia suggestive of paroxysmal supraventricular tachycardia.  . S/P appy     Past Surgical History:  Procedure Laterality Date  . APPENDECTOMY    . cardiolyte EF 77%, no ischemia in 2006  2006  . CARPAL TUNNEL RELEASE Right 04/20/2015   Procedure: RIGHT CARPAL TUNNEL RELEASE;  Surgeon: Daryll Brod, MD;  Location: [place];  Service: Orthopedics;  Laterality: Right;  . CARPAL TUNNEL RELEASE Left 11/04/2015   Procedure: CARPAL TUNNEL RELEASE;  Surgeon: Daryll Brod, MD;  Location: [place];  Service: Orthopedics;  Laterality: Left;  . CESAREAN SECTION    . OPEN REDUCTION INTERNAL FIXATION (ORIF) DISTAL RADIAL FRACTURE Left 11/04/2015   Procedure: OPEN REDUCTION INTERNAL FIXATION (ORIF) LEFT DISTAL RADIAL FRACTURE POSSIBLE BONE GRAFT;  Surgeon: Daryll Brod, MD;  Location: [place];  Service: Orthopedics;  Laterality: Left;  . TRIGGER FINGER RELEASE Right 04/20/2015   Procedure: RELEASE TRIGGER FINGER/A-1 PULLEY RIGHT MIDDLE FINGER,RIGHT RING FINGER;  Surgeon: Daryll Brod, MD;  Location: [place];  Service: Orthopedics;  Laterality: Right;  . TUBAL LIGATION    . ULNAR NERVE TRANSPOSITION Right 04/20/2015   Procedure: RIGHT ULNAR NERVE DECOMPRESSION;  Surgeon: Daryll Brod, MD;  Location: [place];  Service: Orthopedics;  Laterality: Right;  . UTERINE FIBROID EMBOLIZATION  2007  . WRIST SURGERY     Cyst removed on left  . WRIST SURGERY Right 1985   tendon repair R wrist    There were no vitals filed for this visit.  Subjective Assessment - 12/10/18 1303    Subjective  I did a lot of walking around and cooking so I was really sore over the weekend. Did the stretches every day after treatment until Saturday becuase my back hurt too bad.     Patient Stated Goals  decrease pain, do daily activities, driving    Currently in Pain?  Yes    Pain Score  6     Pain Location  Back    Pain Orientation  Right;Left;Lower  Pain Descriptors / Indicators  Sore    Aggravating Factors   walking around too much    Pain Relieving Factors  rest                       [facility] Adult PT Treatment/Exercise - 12/10/18 0001      Knee/Hip Exercises: Stretches   Piriformis Stretch  Both;30 seconds    Other Knee/Hip Stretches  SKTC 2x30s each      Knee/Hip Exercises: Aerobic   Nustep  5 min L5 UE & LE      Knee/Hip Exercises: Supine   Bridges with [employer]  15 reps    Straight Leg Raises  Both;10 reps    Straight Leg Raises Limitations  straight leg lower with opp arm lower OH    Other Supine Knee/Hip Exercises  alt dead bug press into knee      Knee/Hip Exercises: Sidelying    Clams  x30 each      Manual Therapy   Soft tissue mobilization  bilateral QL & lumbar paraspinals               PT Short Term Goals - 11/27/18 1618      PT SHORT TERM GOAL #1   Title  Pt will be able to demonstrate proper form with short term HEP as it has been established    Baseline  began at eval    Time  3    Period  Weeks    Status  New    Target Date  12/18/18      PT SHORT TERM GOAL #2   Title  Pt will be able to adjust her posture in a seated position & while driving to support lumbar spine    Baseline  will continue to education    Time  3    Period  Weeks    Status  New    Target Date  12/18/18        PT Long Term Goals - 11/27/18 1619      PT LONG TERM GOAL #1   Title  Pt will be able to perform bending and lifting without a spike of LBP upon return to stand    Baseline  significant pain at eval    Time  6    Period  Weeks    Status  New    Target Date  01/10/19      PT LONG TERM GOAL #2   Title  Pt will be able to get out of bed pain <=2/10    Baseline  severe in the AM    Time  6    Period  Weeks    Status  New    Target Date  01/10/19      PT LONG TERM GOAL #3   Title  pt will demo gross 5/5 lumbopelvic strength    Baseline  see flowsheet    Time  6    Period  Weeks    Status  New    Target Date  01/10/19      PT LONG TERM GOAL #4   Title  Pt will be able to sleep comfortably    Baseline  frequent shifting at eval    Time  6    Period  Weeks    Status  New    Target Date  01/10/19            Plan - 12/10/18 1342  Clinical Impression Statement  We discussed the importance of taking rest breaks today to avoid bringing on high levels of pain. Also asked her to do stretches especially when she is more sore, even if it is more gentle. Added strengthening exercises for core and hip abductors today which were tolerated well without incr in LBP. Knots along paraspinals Rt>Lt created concordant pain and were reduced with IASTM.      PT Treatment/Interventions  ADLs/Self Care Home Management;Cryotherapy;Traction;Moist Heat;Electrical Stimulation;Functional mobility training;Balance training;Therapeutic exercise;Therapeutic activities;Patient/family education;Manual techniques;Dry needling;Passive range of motion;Taping;Joint Manipulations;Spinal Manipulations;Iontophoresis 4mg /ml Dexamethasone    PT Next Visit Plan  continue core strengthening    PT Home Exercise Plan  tennis ball STM, ITB stretch, PPT with iso abd in hooklying; stretches: hamstrings, gastroc, figure4, LTR; bridge ball squeeze; wall sits, PPT at wall; clam, dead bug ext, alt iso hip flx with opp UE.     Consulted and Agree with Plan of Care  Patient       Patient will benefit from skilled therapeutic intervention in order to improve the following deficits and impairments:  Improper body mechanics, Pain, Postural dysfunction, Increased muscle spasms, Decreased activity tolerance, Decreased strength, Difficulty walking  Visit Diagnosis: Chronic bilateral low back pain without sciatica  Muscle weakness (generalized)     Problem List Patient Active Problem List   Diagnosis Date Noted  . Hypercalcemia 10/08/2018  . Seasonal allergies 08/27/2018  . Pre-op exam 06/11/2018  . Osteoarthritis involving multiple joints on both sides of body 05/25/2018  . Statin intolerance 12/26/2017  . PSVT (paroxysmal supraventricular tachycardia) ([place]) 11/09/2017  . Hyperlipidemia 08/30/2017  . OSA (obstructive sleep apnea) 08/31/2016  . Seizure ([place]) 06/08/2014  . Iron deficiency anemia 07/01/2012  . Diabetic retinopathy ([place]) 07/21/2011  . Asthma 04/04/2010  . GERD 12/30/2007  . ARTHRITIS, KNEE 09/17/2007  . Diabetic polyneuropathy ([place]) 08/30/2006  . Type 2 diabetes mellitus without complication, without long-term current use of insulin ([place]) 08/30/2006  . HYPERCHOLESTEROLEMIA 08/30/2006  . HEARING LOSS NOS OR DEAFNESS 08/30/2006   Koralynn Greenspan C. Sadhana Frater PT,  DPT 12/10/18 1:45 PM   [facility] [address] [place], [place], [address] Phone: [phone]   Fax:  [phone]  Name: ALVIE SPELTZ MRN: [id number] Date of Birth: 07/02/1956

## 2018-12-12 ENCOUNTER — Ambulatory Visit: Payer: Medicare Other | Admitting: Physical Therapy

## 2018-12-12 ENCOUNTER — Encounter: Payer: Self-pay | Admitting: Physical Therapy

## 2018-12-12 ENCOUNTER — Other Ambulatory Visit: Payer: Self-pay

## 2018-12-12 DIAGNOSIS — G8929 Other chronic pain: Secondary | ICD-10-CM | POA: Diagnosis not present

## 2018-12-12 DIAGNOSIS — M545 Low back pain: Secondary | ICD-10-CM | POA: Diagnosis not present

## 2018-12-12 DIAGNOSIS — M6281 Muscle weakness (generalized): Secondary | ICD-10-CM | POA: Diagnosis not present

## 2018-12-12 NOTE — Therapy (Signed)
[place], [place], [address] Phone: [phone]   Fax:  [phone]  Physical Therapy Treatment  Patient Details  Name: Alicia [name] MRN: [id number] Date of Birth: 02/08/1956 Referring Provider (PT): Zenia Resides, MD   Encounter Date: 12/12/2018  PT End of Session - 12/12/18 1325    Visit Number  5    Number of Visits  13    Date for PT Re-Evaluation  01/10/19    Authorization Type  MCR- KX at visit 15   PN at visit 10    PT Start Time  1325    PT Stop Time  1416    PT Time Calculation (min)  51 min    Activity Tolerance  Patient tolerated treatment well    Behavior During Therapy  [facility] for tasks assessed/performed       Past Medical History:  Diagnosis Date  . Anemia   . Complete deafness    Meningitis at age 63  . Deaf   . Diabetes mellitus   . Diabetic neuropathy ([place])   . Ganglion cyst 06/22/2011  . Gastroparesis   . GERD (gastroesophageal reflux disease)   . H/O: C-section   . Hyperlipidemia   . Hypertension   . Neuromuscular disorder ([place])    diabetic neuropathy  . PSVT (paroxysmal supraventricular tachycardia) ([place]) 11/09/2017   Event monitor 09/14/2017 - Predominantly sinus rhythm with episodes of narrow-complex tachycardia suggestive of paroxysmal supraventricular tachycardia.  . S/P appy     Past Surgical History:  Procedure Laterality Date  . APPENDECTOMY    . cardiolyte EF 77%, no ischemia in 2006  2006  . CARPAL TUNNEL RELEASE Right 04/20/2015   Procedure: RIGHT CARPAL TUNNEL RELEASE;  Surgeon: Daryll Brod, MD;  Location: [place];  Service: Orthopedics;  Laterality: Right;  . CARPAL TUNNEL RELEASE Left 11/04/2015   Procedure: CARPAL TUNNEL RELEASE;  Surgeon: Daryll Brod, MD;  Location: [place];  Service: Orthopedics;  Laterality: Left;  . CESAREAN SECTION    . OPEN REDUCTION INTERNAL FIXATION (ORIF) DISTAL RADIAL FRACTURE Left 11/04/2015    Procedure: OPEN REDUCTION INTERNAL FIXATION (ORIF) LEFT DISTAL RADIAL FRACTURE POSSIBLE BONE GRAFT;  Surgeon: Daryll Brod, MD;  Location: [place];  Service: Orthopedics;  Laterality: Left;  . TRIGGER FINGER RELEASE Right 04/20/2015   Procedure: RELEASE TRIGGER FINGER/A-1 PULLEY RIGHT MIDDLE FINGER,RIGHT RING FINGER;  Surgeon: Daryll Brod, MD;  Location: [place];  Service: Orthopedics;  Laterality: Right;  . TUBAL LIGATION    . ULNAR NERVE TRANSPOSITION Right 04/20/2015   Procedure: RIGHT ULNAR NERVE DECOMPRESSION;  Surgeon: Daryll Brod, MD;  Location: [place];  Service: Orthopedics;  Laterality: Right;  . UTERINE FIBROID EMBOLIZATION  2007  . WRIST SURGERY     Cyst removed on left  . WRIST SURGERY Right 1985   tendon repair R wrist    There were no vitals filed for this visit.  Subjective Assessment - 12/12/18 1325    Subjective  I took breaks and did stretches, feeling better.    Currently in Pain?  Yes    Pain Score  4     Pain Location  Back    Pain Orientation  Right;Left;Lower    Pain Descriptors / Indicators  Aching    Aggravating Factors   being up a lot during the week    Pain Relieving Factors  rest/stretching  [facility] Adult PT Treatment/Exercise - 12/12/18 0001      Knee/Hip Exercises: Stretches   Passive Hamstring Stretch  Both;2 reps;30 seconds    Passive Hamstring Stretch Limitations  seated EOB with strap      Knee/Hip Exercises: Aerobic   Nustep  5 min L5 UE & LE      Knee/Hip Exercises: Standing   Rocker Board Limitations  A/P, static & dynamic    SLS  with opp toes on floor    Gait Training  with treking poles    Other Standing Knee Exercises  narrow tandem    Other Standing Knee Exercises  in// bars: step up to blue therapad; walking on line      Knee/Hip Exercises: Seated   Sit to Sand  10 reps;without UE support   cues for glut set & gentle sit            PT  Education - 12/12/18 1417    Education Details  association of balance with deafness as well as back pain, fall risk, soreness with practicing balance- increased time required for interpretation    Person(s) Educated  Patient    Methods  Explanation;Demonstration;Tactile cues    Comprehension  Verbalized understanding;Returned demonstration;Tactile cues required;Need further instruction       PT Short Term Goals - 11/27/18 1618      PT SHORT TERM GOAL #1   Title  Pt will be able to demonstrate proper form with short term HEP as it has been established    Baseline  began at eval    Time  3    Period  Weeks    Status  New    Target Date  12/18/18      PT SHORT TERM GOAL #2   Title  Pt will be able to adjust her posture in a seated position & while driving to support lumbar spine    Baseline  will continue to education    Time  3    Period  Weeks    Status  New    Target Date  12/18/18        PT Long Term Goals - 11/27/18 1619      PT LONG TERM GOAL #1   Title  Pt will be able to perform bending and lifting without a spike of LBP upon return to stand    Baseline  significant pain at eval    Time  6    Period  Weeks    Status  New    Target Date  01/10/19      PT LONG TERM GOAL #2   Title  Pt will be able to get out of bed pain <=2/10    Baseline  severe in the AM    Time  6    Period  Weeks    Status  New    Target Date  01/10/19      PT LONG TERM GOAL #3   Title  pt will demo gross 5/5 lumbopelvic strength    Baseline  see flowsheet    Time  6    Period  Weeks    Status  New    Target Date  01/10/19      PT LONG TERM GOAL #4   Title  Pt will be able to sleep comfortably    Baseline  frequent shifting at eval    Time  6    Period  Weeks    Status  New  Target Date  01/10/19            Plan - 12/12/18 1420    Clinical Impression Statement  Pt had difficulty standing from chair using gluts because of LOB which she reports balance has always been an  issue for her due to deafness. We discussed how poor balance can contribute to back pain. Utilized parallel bars for safety. Practiced ambulating with treking poles as an option for safety since she does not want to use a SPC.    PT Treatment/Interventions  ADLs/Self Care Home Management;Cryotherapy;Traction;Moist Heat;Electrical Stimulation;Functional mobility training;Balance training;Therapeutic exercise;Therapeutic activities;Patient/family education;Manual techniques;Dry needling;Passive range of motion;Taping;Joint Manipulations;Spinal Manipulations;Iontophoresis 4mg /ml Dexamethasone    PT Next Visit Plan  core strengthening, add balance to HEP    PT Home Exercise Plan  tennis ball STM, ITB stretch, PPT with iso abd in hooklying; stretches: hamstrings, gastroc, figure4, LTR; bridge ball squeeze; wall sits, PPT at wall; clam, dead bug ext, alt iso hip flx with opp UE. sit to stand with gluts;    Consulted and Agree with Plan of Care  Patient       Patient will benefit from skilled therapeutic intervention in order to improve the following deficits and impairments:  Improper body mechanics, Pain, Postural dysfunction, Increased muscle spasms, Decreased activity tolerance, Decreased strength, Difficulty walking  Visit Diagnosis: Chronic bilateral low back pain without sciatica  Muscle weakness (generalized)     Problem List Patient Active Problem List   Diagnosis Date Noted  . Hypercalcemia 10/08/2018  . Seasonal allergies 08/27/2018  . Pre-op exam 06/11/2018  . Osteoarthritis involving multiple joints on both sides of body 05/25/2018  . Statin intolerance 12/26/2017  . PSVT (paroxysmal supraventricular tachycardia) ([place]) 11/09/2017  . Hyperlipidemia 08/30/2017  . OSA (obstructive sleep apnea) 08/31/2016  . Seizure ([place]) 06/08/2014  . Iron deficiency anemia 07/01/2012  . Diabetic retinopathy ([place]) 07/21/2011  . Asthma 04/04/2010  . GERD 12/30/2007  . ARTHRITIS, KNEE 09/17/2007   . Diabetic polyneuropathy ([place]) 08/30/2006  . Type 2 diabetes mellitus without complication, without long-term current use of insulin ([place]) 08/30/2006  . HYPERCHOLESTEROLEMIA 08/30/2006  . HEARING LOSS NOS OR DEAFNESS 08/30/2006    Anjanette Gilkey C. Jacqualin Shirkey PT, DPT 12/12/18 2:23 PM   [facility] [address] [place], [place], [address] Phone: [phone]   Fax:  [phone]  Name: KIANI WURTZEL MRN: [id number] Date of Birth: 09-24-1955

## 2018-12-17 ENCOUNTER — Ambulatory Visit: Payer: Medicare Other | Admitting: Physical Therapy

## 2018-12-17 ENCOUNTER — Encounter: Payer: Self-pay | Admitting: Physical Therapy

## 2018-12-17 ENCOUNTER — Other Ambulatory Visit: Payer: Self-pay

## 2018-12-17 DIAGNOSIS — G8929 Other chronic pain: Secondary | ICD-10-CM

## 2018-12-17 DIAGNOSIS — M6281 Muscle weakness (generalized): Secondary | ICD-10-CM | POA: Diagnosis not present

## 2018-12-17 DIAGNOSIS — M545 Low back pain: Secondary | ICD-10-CM | POA: Diagnosis not present

## 2018-12-17 NOTE — Therapy (Signed)
[place], [place], [address] Phone: [phone]   Fax:  [phone]  Physical Therapy Treatment  Patient Details  Name: Alicia [name] MRN: [id number] Date of Birth: 30-Aug-1955 Referring Provider (PT): Zenia Resides, MD   Encounter Date: 12/17/2018  PT End of Session - 12/17/18 1349    Visit Number  6    Number of Visits  13    Date for PT Re-Evaluation  01/10/19    Authorization Type  MCR- KX at visit 15   PN at visit 10    PT Start Time  1301    PT Stop Time  1349    PT Time Calculation (min)  48 min    Activity Tolerance  Patient tolerated treatment well    Behavior During Therapy  [facility] for tasks assessed/performed       Past Medical History:  Diagnosis Date  . Anemia   . Complete deafness    Meningitis at age 17  . Deaf   . Diabetes mellitus   . Diabetic neuropathy ([place])   . Ganglion cyst 06/22/2011  . Gastroparesis   . GERD (gastroesophageal reflux disease)   . H/O: C-section   . Hyperlipidemia   . Hypertension   . Neuromuscular disorder ([place])    diabetic neuropathy  . PSVT (paroxysmal supraventricular tachycardia) ([place]) 11/09/2017   Event monitor 09/14/2017 - Predominantly sinus rhythm with episodes of narrow-complex tachycardia suggestive of paroxysmal supraventricular tachycardia.  . S/P appy     Past Surgical History:  Procedure Laterality Date  . APPENDECTOMY    . cardiolyte EF 77%, no ischemia in 2006  2006  . CARPAL TUNNEL RELEASE Right 04/20/2015   Procedure: RIGHT CARPAL TUNNEL RELEASE;  Surgeon: Daryll Brod, MD;  Location: [place];  Service: Orthopedics;  Laterality: Right;  . CARPAL TUNNEL RELEASE Left 11/04/2015   Procedure: CARPAL TUNNEL RELEASE;  Surgeon: Daryll Brod, MD;  Location: [place];  Service: Orthopedics;  Laterality: Left;  . CESAREAN SECTION    . OPEN REDUCTION INTERNAL FIXATION (ORIF) DISTAL RADIAL FRACTURE Left 11/04/2015   Procedure: OPEN REDUCTION INTERNAL FIXATION (ORIF) LEFT DISTAL RADIAL FRACTURE POSSIBLE BONE GRAFT;  Surgeon: Daryll Brod, MD;  Location: [place];  Service: Orthopedics;  Laterality: Left;  . TRIGGER FINGER RELEASE Right 04/20/2015   Procedure: RELEASE TRIGGER FINGER/A-1 PULLEY RIGHT MIDDLE FINGER,RIGHT RING FINGER;  Surgeon: Daryll Brod, MD;  Location: [place];  Service: Orthopedics;  Laterality: Right;  . TUBAL LIGATION    . ULNAR NERVE TRANSPOSITION Right 04/20/2015   Procedure: RIGHT ULNAR NERVE DECOMPRESSION;  Surgeon: Daryll Brod, MD;  Location: [place];  Service: Orthopedics;  Laterality: Right;  . UTERINE FIBROID EMBOLIZATION  2007  . WRIST SURGERY     Cyst removed on left  . WRIST SURGERY Right 1985   tendon repair R wrist    There were no vitals filed for this visit.  Subjective Assessment - 12/17/18 1304    Subjective  I feel better than I did last time.    Currently in Pain?  Yes    Pain Score  3     Pain Location  Back    Pain Orientation  Right;Left;Lower    Pain Descriptors / Indicators  Sore    Aggravating Factors   doing too much    Pain Relieving Factors  rest         [facility] PT Assessment - 12/17/18  0001      Assessment   Medical Diagnosis  Osteoarthritis involving multiple joints on both sides of body    Referring Provider (PT)  Hensel, Jamal Collin, MD    Hand Dominance  Right      AROM   Lumbar Flexion  achey    Lumbar Extension  WFL    Lumbar - Right Side Bend  mild discomfort    Lumbar - Left Side Bend  mild discomfort      Strength   Strength Assessment Site  Hip    Right/Left Hip  Right;Left    Right Hip Flexion  5/5    Right Hip ABduction  4+/5    Left Hip Flexion  5/5    Left Hip ABduction  4/5   pain in Left groin     Palpation   Palpation comment  mild TTP bil greater trochanter; no pain with fem-acetabular testing                   [facility] Adult PT Treatment/Exercise - 12/17/18  0001      Exercises   Exercises  Other Exercises    Other Exercises   reviewed HEP exercises listed in Plan section of note      Knee/Hip Exercises: Aerobic   Nustep  5 min L5 UE & LE      Knee/Hip Exercises: Standing   Rocker Board Limitations  A/P & lateral static & dynamic    Other Standing Knee Exercises  wide tandem walk with 3s holds             PT Education - 12/17/18 1356    Education Details  STGs, progress    Person(s) Educated  Patient    Methods  Explanation    Comprehension  Verbalized understanding;Need further instruction       PT Short Term Goals - 12/17/18 1305      PT SHORT TERM GOAL #1   Title  Pt will be able to demonstrate proper form with short term HEP as it has been established      PT SHORT TERM GOAL #2   Title  Pt will be able to adjust her posture in a seated position & while driving to support lumbar spine    Baseline  reports she is doing well    Status  Achieved        PT Long Term Goals - 11/27/18 1619      PT LONG TERM GOAL #1   Title  Pt will be able to perform bending and lifting without a spike of LBP upon return to stand    Baseline  significant pain at eval    Time  6    Period  Weeks    Status  New    Target Date  01/10/19      PT LONG TERM GOAL #2   Title  Pt will be able to get out of bed pain <=2/10    Baseline  severe in the AM    Time  6    Period  Weeks    Status  New    Target Date  01/10/19      PT LONG TERM GOAL #3   Title  pt will demo gross 5/5 lumbopelvic strength    Baseline  see flowsheet    Time  6    Period  Weeks    Status  New    Target Date  01/10/19  PT LONG TERM GOAL #4   Title  Pt will be able to sleep comfortably    Baseline  frequent shifting at eval    Time  6    Period  Weeks    Status  New    Target Date  01/10/19            Plan - 12/17/18 1353    Clinical Impression Statement  Reviewed HEP and assessed STGS & objective measures today- pt is making improvements  with treatment and tolerated balance challenges well at last visit. Added wide tandem balance down hallway at home to HEP. Has to miss her other appointment this week due to procedure on eye.    PT Treatment/Interventions  ADLs/Self Care Home Management;Cryotherapy;Traction;Moist Heat;Electrical Stimulation;Functional mobility training;Balance training;Therapeutic exercise;Therapeutic activities;Patient/family education;Manual techniques;Dry needling;Passive range of motion;Taping;Joint Manipulations;Spinal Manipulations;Iontophoresis 4mg /ml Dexamethasone    PT Home Exercise Plan  tennis ball STM, ITB stretch, PPT with iso abd in hooklying; stretches: hamstrings, gastroc, figure4, LTR; bridge ball squeeze; wall sits, PPT at wall; clam, dead bug ext, alt iso hip flx with opp UE. sit to stand with gluts; wide tandem balance    Consulted and Agree with Plan of Care  Patient       Patient will benefit from skilled therapeutic intervention in order to improve the following deficits and impairments:  Improper body mechanics, Pain, Postural dysfunction, Increased muscle spasms, Decreased activity tolerance, Decreased strength, Difficulty walking  Visit Diagnosis: 1. Chronic bilateral low back pain without sciatica   2. Muscle weakness (generalized)        Problem List Patient Active Problem List   Diagnosis Date Noted  . Hypercalcemia 10/08/2018  . Seasonal allergies 08/27/2018  . Pre-op exam 06/11/2018  . Osteoarthritis involving multiple joints on both sides of body 05/25/2018  . Statin intolerance 12/26/2017  . PSVT (paroxysmal supraventricular tachycardia) ([place]) 11/09/2017  . Hyperlipidemia 08/30/2017  . OSA (obstructive sleep apnea) 08/31/2016  . Seizure ([place]) 06/08/2014  . Iron deficiency anemia 07/01/2012  . Diabetic retinopathy ([place]) 07/21/2011  . Asthma 04/04/2010  . GERD 12/30/2007  . ARTHRITIS, KNEE 09/17/2007  . Diabetic polyneuropathy ([place]) 08/30/2006  . Type 2 diabetes  mellitus without complication, without long-term current use of insulin ([place]) 08/30/2006  . HYPERCHOLESTEROLEMIA 08/30/2006  . HEARING LOSS NOS OR DEAFNESS 08/30/2006   Alicia [name] PT, DPT 12/17/18 1:56 PM   [facility] [address] [place], [place], [address] Phone: [phone]   Fax:  [phone]  Name: Alicia [name] MRN: [id number] Date of Birth: [birth year]

## 2018-12-19 ENCOUNTER — Ambulatory Visit: Payer: Medicare Other | Admitting: Physical Therapy

## 2018-12-19 DIAGNOSIS — E113513 Type 2 diabetes mellitus with proliferative diabetic retinopathy with macular edema, bilateral: Secondary | ICD-10-CM | POA: Diagnosis not present

## 2018-12-19 DIAGNOSIS — E113512 Type 2 diabetes mellitus with proliferative diabetic retinopathy with macular edema, left eye: Secondary | ICD-10-CM | POA: Diagnosis not present

## 2018-12-24 ENCOUNTER — Ambulatory Visit: Payer: Medicare Other | Admitting: Physical Therapy

## 2018-12-24 ENCOUNTER — Other Ambulatory Visit: Payer: Self-pay | Admitting: Physician Assistant

## 2018-12-24 ENCOUNTER — Encounter: Payer: Self-pay | Admitting: Physical Therapy

## 2018-12-24 ENCOUNTER — Other Ambulatory Visit: Payer: Self-pay

## 2018-12-24 DIAGNOSIS — M545 Low back pain: Secondary | ICD-10-CM | POA: Diagnosis not present

## 2018-12-24 DIAGNOSIS — G8929 Other chronic pain: Secondary | ICD-10-CM | POA: Diagnosis not present

## 2018-12-24 DIAGNOSIS — M6281 Muscle weakness (generalized): Secondary | ICD-10-CM | POA: Diagnosis not present

## 2018-12-24 MED ORDER — CARVEDILOL 25 MG PO TABS: 25.0000 mg | ORAL_TABLET | Freq: Two times a day (BID) | ORAL | 0 refills | Status: DC

## 2018-12-24 NOTE — Therapy (Signed)
[place], [place], [address] Phone: [phone]   Fax:  [phone]  Physical Therapy Treatment  Patient Details  Name: Alicia [name] MRN: [id number] Date of Birth: 1956/05/27 Referring Provider (PT): Zenia Resides, MD   Encounter Date: 12/24/2018  PT End of Session - 12/24/18 1306    Visit Number  7    Number of Visits  13    Date for PT Re-Evaluation  01/10/19    Authorization Type  MCR- KX at visit 15   PN at visit 10    PT Start Time  1300    PT Stop Time  1342    PT Time Calculation (min)  42 min    Activity Tolerance  Patient tolerated treatment well    Behavior During Therapy  [facility] for tasks assessed/performed       Past Medical History:  Diagnosis Date  . Anemia   . Complete deafness    Meningitis at age 63  . Deaf   . Diabetes mellitus   . Diabetic neuropathy ([place])   . Ganglion cyst 06/22/2011  . Gastroparesis   . GERD (gastroesophageal reflux disease)   . H/O: C-section   . Hyperlipidemia   . Hypertension   . Neuromuscular disorder ([place])    diabetic neuropathy  . PSVT (paroxysmal supraventricular tachycardia) ([place]) 11/09/2017   Event monitor 09/14/2017 - Predominantly sinus rhythm with episodes of narrow-complex tachycardia suggestive of paroxysmal supraventricular tachycardia.  . S/P appy     Past Surgical History:  Procedure Laterality Date  . APPENDECTOMY    . cardiolyte EF 77%, no ischemia in 2006  2006  . CARPAL TUNNEL RELEASE Right 04/20/2015   Procedure: RIGHT CARPAL TUNNEL RELEASE;  Surgeon: Daryll Brod, MD;  Location: [place];  Service: Orthopedics;  Laterality: Right;  . CARPAL TUNNEL RELEASE Left 11/04/2015   Procedure: CARPAL TUNNEL RELEASE;  Surgeon: Daryll Brod, MD;  Location: [place];  Service: Orthopedics;  Laterality: Left;  . CESAREAN SECTION    . OPEN REDUCTION INTERNAL FIXATION (ORIF) DISTAL RADIAL FRACTURE Left 11/04/2015   Procedure: OPEN REDUCTION INTERNAL FIXATION (ORIF) LEFT DISTAL RADIAL FRACTURE POSSIBLE BONE GRAFT;  Surgeon: Daryll Brod, MD;  Location: [place];  Service: Orthopedics;  Laterality: Left;  . TRIGGER FINGER RELEASE Right 04/20/2015   Procedure: RELEASE TRIGGER FINGER/A-1 PULLEY RIGHT MIDDLE FINGER,RIGHT RING FINGER;  Surgeon: Daryll Brod, MD;  Location: [place];  Service: Orthopedics;  Laterality: Right;  . TUBAL LIGATION    . ULNAR NERVE TRANSPOSITION Right 04/20/2015   Procedure: RIGHT ULNAR NERVE DECOMPRESSION;  Surgeon: Daryll Brod, MD;  Location: [place];  Service: Orthopedics;  Laterality: Right;  . UTERINE FIBROID EMBOLIZATION  2007  . WRIST SURGERY     Cyst removed on left  . WRIST SURGERY Right 1985   tendon repair R wrist    There were no vitals filed for this visit.  Subjective Assessment - 12/24/18 1303    Subjective  Middle of my back still bothers me some. I am doing my stretches every day and doing better when walking with my daughter.    Patient Stated Goals  decrease pain, do daily activities, driving    Currently in Pain?  Yes    Pain Score  2     Pain Location  Back    Pain Orientation  Lower;Mid    Pain Descriptors / Indicators  Aching  Aggravating Factors   long walks    Pain Relieving Factors  rest                       [facility] Adult PT Treatment/Exercise - 12/24/18 0001      Exercises   Other Exercises   see note      Knee/Hip Exercises: Stretches   Passive Hamstring Stretch Limitations  seated in chair      Knee/Hip Exercises: Aerobic   Nustep  5 min L5 UE & LE       in parallel bars: Wide tandem gait with 3s balance Tandem walking on line Stepping over hurdles Wobble board static/dynamic, A/p/lateral airex slow march airex NBOS balance Step up to airex with knee drive and controlled step back Standing hip abduction x20 each Figure 4 stretch seated       PT Short Term  Goals - 12/17/18 1305      PT SHORT TERM GOAL #1   Title  Pt will be able to demonstrate proper form with short term HEP as it has been established      PT SHORT TERM GOAL #2   Title  Pt will be able to adjust her posture in a seated position & while driving to support lumbar spine    Baseline  reports she is doing well    Status  Achieved        PT Long Term Goals - 11/27/18 1619      PT LONG TERM GOAL #1   Title  Pt will be able to perform bending and lifting without a spike of LBP upon return to stand    Baseline  significant pain at eval    Time  6    Period  Weeks    Status  New    Target Date  01/10/19      PT LONG TERM GOAL #2   Title  Pt will be able to get out of bed pain <=2/10    Baseline  severe in the AM    Time  6    Period  Weeks    Status  New    Target Date  01/10/19      PT LONG TERM GOAL #3   Title  pt will demo gross 5/5 lumbopelvic strength    Baseline  see flowsheet    Time  6    Period  Weeks    Status  New    Target Date  01/10/19      PT LONG TERM GOAL #4   Title  Pt will be able to sleep comfortably    Baseline  frequent shifting at eval    Time  6    Period  Weeks    Status  New    Target Date  01/10/19            Plan - 12/24/18 1342    Clinical Impression Statement  Improved balance control today in challenges and pt denied any pain in back. Will continue to benefit from balance control to decrease use of low back in functional ambulation and obstacle navigation.    PT Treatment/Interventions  ADLs/Self Care Home Management;Cryotherapy;Traction;Moist Heat;Electrical Stimulation;Functional mobility training;Balance training;Therapeutic exercise;Therapeutic activities;Patient/family education;Manual techniques;Dry needling;Passive range of motion;Taping;Joint Manipulations;Spinal Manipulations;Iontophoresis 4mg /ml Dexamethasone    PT Next Visit Plan  continue balance challenges    PT Home Exercise Plan  tennis ball STM, ITB stretch,  PPT with iso abd in hooklying; stretches: hamstrings,  gastroc, figure4, LTR; bridge ball squeeze; wall sits, PPT at wall; clam, dead bug ext, alt iso hip flx with opp UE. sit to stand with gluts; wide tandem balance    Consulted and Agree with Plan of Care  Patient       Patient will benefit from skilled therapeutic intervention in order to improve the following deficits and impairments:  Improper body mechanics, Pain, Postural dysfunction, Increased muscle spasms, Decreased activity tolerance, Decreased strength, Difficulty walking  Visit Diagnosis: 1. Chronic bilateral low back pain without sciatica   2. Muscle weakness (generalized)        Problem List Patient Active Problem List   Diagnosis Date Noted  . Hypercalcemia 10/08/2018  . Seasonal allergies 08/27/2018  . Pre-op exam 06/11/2018  . Osteoarthritis involving multiple joints on both sides of body 05/25/2018  . Statin intolerance 12/26/2017  . PSVT (paroxysmal supraventricular tachycardia) ([place]) 11/09/2017  . Hyperlipidemia 08/30/2017  . OSA (obstructive sleep apnea) 08/31/2016  . Seizure ([place]) 06/08/2014  . Iron deficiency anemia 07/01/2012  . Diabetic retinopathy ([place]) 07/21/2011  . Asthma 04/04/2010  . GERD 12/30/2007  . ARTHRITIS, KNEE 09/17/2007  . Diabetic polyneuropathy ([place]) 08/30/2006  . Type 2 diabetes mellitus without complication, without long-term current use of insulin ([place]) 08/30/2006  . HYPERCHOLESTEROLEMIA 08/30/2006  . HEARING LOSS NOS OR DEAFNESS 08/30/2006    Ellina Sivertsen C. Dillan Candela PT, DPT 12/24/18 1:45 PM   [facility] [address] [place], [place], [address] Phone: [phone]   Fax:  [phone]  Name: REITA SHINDLER MRN: [id number] Date of Birth: 25-Mar-1956

## 2018-12-26 ENCOUNTER — Other Ambulatory Visit: Payer: Self-pay

## 2018-12-26 ENCOUNTER — Ambulatory Visit: Payer: Medicare Other | Admitting: Physical Therapy

## 2018-12-26 ENCOUNTER — Encounter: Payer: Self-pay | Admitting: Physical Therapy

## 2018-12-26 DIAGNOSIS — G8929 Other chronic pain: Secondary | ICD-10-CM

## 2018-12-26 DIAGNOSIS — M545 Low back pain: Secondary | ICD-10-CM | POA: Diagnosis not present

## 2018-12-26 DIAGNOSIS — M6281 Muscle weakness (generalized): Secondary | ICD-10-CM

## 2018-12-26 NOTE — Therapy (Signed)
[place], [place], [address] Phone: [phone]   Fax:  [phone]  Physical Therapy Treatment  Patient Details  Name: Alicia [name] MRN: [id number] Date of Birth: 03/11/1956 Referring Provider (PT): Zenia Resides, MD   Encounter Date: 12/26/2018  PT End of Session - 12/26/18 1333    Visit Number  8    Number of Visits  13    Date for PT Re-Evaluation  01/10/19    Authorization Type  MCR- KX at visit 15   PN at visit 10    PT Start Time  1333    PT Stop Time  1411    PT Time Calculation (min)  38 min    Activity Tolerance  Patient tolerated treatment well    Behavior During Therapy  [facility] for tasks assessed/performed       Past Medical History:  Diagnosis Date  . Anemia   . Complete deafness    Meningitis at age 22  . Deaf   . Diabetes mellitus   . Diabetic neuropathy ([place])   . Ganglion cyst 06/22/2011  . Gastroparesis   . GERD (gastroesophageal reflux disease)   . H/O: C-section   . Hyperlipidemia   . Hypertension   . Neuromuscular disorder ([place])    diabetic neuropathy  . PSVT (paroxysmal supraventricular tachycardia) ([place]) 11/09/2017   Event monitor 09/14/2017 - Predominantly sinus rhythm with episodes of narrow-complex tachycardia suggestive of paroxysmal supraventricular tachycardia.  . S/P appy     Past Surgical History:  Procedure Laterality Date  . APPENDECTOMY    . cardiolyte EF 77%, no ischemia in 2006  2006  . CARPAL TUNNEL RELEASE Right 04/20/2015   Procedure: RIGHT CARPAL TUNNEL RELEASE;  Surgeon: Daryll Brod, MD;  Location: [place];  Service: Orthopedics;  Laterality: Right;  . CARPAL TUNNEL RELEASE Left 11/04/2015   Procedure: CARPAL TUNNEL RELEASE;  Surgeon: Daryll Brod, MD;  Location: [place];  Service: Orthopedics;  Laterality: Left;  . CESAREAN SECTION    . OPEN REDUCTION INTERNAL FIXATION (ORIF) DISTAL RADIAL FRACTURE Left 11/04/2015    Procedure: OPEN REDUCTION INTERNAL FIXATION (ORIF) LEFT DISTAL RADIAL FRACTURE POSSIBLE BONE GRAFT;  Surgeon: Daryll Brod, MD;  Location: [place];  Service: Orthopedics;  Laterality: Left;  . TRIGGER FINGER RELEASE Right 04/20/2015   Procedure: RELEASE TRIGGER FINGER/A-1 PULLEY RIGHT MIDDLE FINGER,RIGHT RING FINGER;  Surgeon: Daryll Brod, MD;  Location: [place];  Service: Orthopedics;  Laterality: Right;  . TUBAL LIGATION    . ULNAR NERVE TRANSPOSITION Right 04/20/2015   Procedure: RIGHT ULNAR NERVE DECOMPRESSION;  Surgeon: Daryll Brod, MD;  Location: [place];  Service: Orthopedics;  Laterality: Right;  . UTERINE FIBROID EMBOLIZATION  2007  . WRIST SURGERY     Cyst removed on left  . WRIST SURGERY Right 1985   tendon repair R wrist    There were no vitals filed for this visit.  Subjective Assessment - 12/26/18 1334    Subjective  Feeling better, walked all over and my daughter was behind me! Still stretching every day.    Patient Stated Goals  decrease pain, do daily activities, driving    Currently in Pain?  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, kneeling      Knee/Hip Exercises: Aerobic   Nustep  5 min L6 UE & LE      Knee/Hip Exercises: Standing   Other Standing Knee Exercises  squat to lift box, lift from counter               PT Short Term Goals - 12/17/18 1305      PT SHORT TERM GOAL #1   Title  Pt will be able to demonstrate proper form with short term HEP as it has been established      PT SHORT TERM GOAL #2   Title  Pt will be able to adjust her posture in a seated position & while driving to support lumbar spine    Baseline  reports she is doing well    Status  Achieved        PT Long Term Goals - 12/26/18 1337       PT LONG TERM GOAL #1   Title  Pt will be able to perform bending and lifting without a spike of LBP upon return to stand    Baseline  bending is okay, but lifting is still uncomfortable      PT LONG TERM GOAL #2   Title  Pt will be able to get out of bed pain <=2/10    Baseline  no pain    Status  Achieved      PT LONG TERM GOAL #3   Title  pt will demo gross 5/5 lumbopelvic strength      PT LONG TERM GOAL #4   Title  Pt will be able to sleep comfortably    Status  Achieved            Plan - 12/26/18 1414    Clinical Impression Statement  Pt is doing very well progressing toward goals so we utilized our time today to discuss modifications that can be made to work activities to decrease strain on lumbar spine. Will review HEP at next visit and d/c.    PT Treatment/Interventions  ADLs/Self Care Home Management;Cryotherapy;Traction;Moist Heat;Electrical Stimulation;Functional mobility training;Balance training;Therapeutic exercise;Therapeutic activities;Patient/family education;Manual techniques;Dry needling;Passive range of motion;Taping;Joint Manipulations;Spinal Manipulations;Iontophoresis 4mg /ml Dexamethasone    PT Next Visit Plan  d/c    PT Home Exercise Plan  tennis ball STM, ITB stretch, PPT with iso abd in hooklying; stretches: hamstrings, gastroc, figure4, LTR; bridge ball squeeze; wall sits, PPT at wall; clam, dead bug ext, alt iso hip flx with opp UE. sit to stand with gluts; wide tandem balance    Consulted and Agree with Plan of Care  Patient       Patient will benefit from skilled therapeutic intervention in order to improve the following deficits and impairments:  Improper body mechanics, Pain, Postural dysfunction, Increased muscle spasms, Decreased activity tolerance, Decreased strength, Difficulty walking  Visit Diagnosis: 1. Chronic bilateral low back pain without sciatica   2. Muscle weakness (generalized)        Problem List Patient Active Problem List    Diagnosis Date Noted  . Hypercalcemia 10/08/2018  . Seasonal allergies 08/27/2018  . Pre-op exam 06/11/2018  . Osteoarthritis involving multiple joints on both sides of body 05/25/2018  . Statin intolerance 12/26/2017  . PSVT (paroxysmal supraventricular tachycardia) ([place]) 11/09/2017  . Hyperlipidemia 08/30/2017  . OSA (obstructive sleep apnea) 08/31/2016  . Seizure ([place]) 06/08/2014  . Iron deficiency anemia 07/01/2012  . Diabetic retinopathy ([place])  07/21/2011  . Asthma 04/04/2010  . GERD 12/30/2007  . ARTHRITIS, KNEE 09/17/2007  . Diabetic polyneuropathy ([place]) 08/30/2006  . Type 2 diabetes mellitus without complication, without long-term current use of insulin ([place]) 08/30/2006  . HYPERCHOLESTEROLEMIA 08/30/2006  . HEARING LOSS NOS OR DEAFNESS 08/30/2006    Antero Derosia C. Konner Saiz PT, DPT 12/26/18 2:17 PM   [place] [facility] [address] [place], [place], [address] Phone: [phone]   Fax:  [phone]  Name: THERISA MENNELLA MRN: [id number] Date of Birth: 1956-01-28

## 2018-12-31 ENCOUNTER — Other Ambulatory Visit: Payer: Self-pay

## 2018-12-31 ENCOUNTER — Ambulatory Visit: Payer: Medicare Other | Admitting: Physical Therapy

## 2018-12-31 ENCOUNTER — Encounter: Payer: Self-pay | Admitting: Physical Therapy

## 2018-12-31 DIAGNOSIS — M6281 Muscle weakness (generalized): Secondary | ICD-10-CM | POA: Diagnosis not present

## 2018-12-31 DIAGNOSIS — G8929 Other chronic pain: Secondary | ICD-10-CM | POA: Diagnosis not present

## 2018-12-31 DIAGNOSIS — M545 Low back pain: Secondary | ICD-10-CM | POA: Diagnosis not present

## 2018-12-31 NOTE — Therapy (Addendum)
[place], [place], [address] Phone: [phone]   Fax:  [phone]  Physical Therapy Treatment/Discharge  Patient Details  Name: Alicia [name] MRN: [id number] Date of Birth: 1955/12/29 Referring Provider (PT): Zenia Resides, MD   Encounter Date: 12/31/2018  PT End of Session - 12/31/18 1342    Visit Number  9    Number of Visits  13    Date for PT Re-Evaluation  01/10/19    Authorization Type  MCR- KX at visit 15   PN at visit 10    PT Start Time  1305    PT Stop Time  1343    PT Time Calculation (min)  38 min    Activity Tolerance  Patient tolerated treatment well    Behavior During Therapy  [facility] for tasks assessed/performed       Past Medical History:  Diagnosis Date  . Anemia   . Complete deafness    Meningitis at age 62  . Deaf   . Diabetes mellitus   . Diabetic neuropathy ([place])   . Ganglion cyst 06/22/2011  . Gastroparesis   . GERD (gastroesophageal reflux disease)   . H/O: C-section   . Hyperlipidemia   . Hypertension   . Neuromuscular disorder ([place])    diabetic neuropathy  . PSVT (paroxysmal supraventricular tachycardia) ([place]) 11/09/2017   Event monitor 09/14/2017 - Predominantly sinus rhythm with episodes of narrow-complex tachycardia suggestive of paroxysmal supraventricular tachycardia.  . S/P appy     Past Surgical History:  Procedure Laterality Date  . APPENDECTOMY    . cardiolyte EF 77%, no ischemia in 2006  2006  . CARPAL TUNNEL RELEASE Right 04/20/2015   Procedure: RIGHT CARPAL TUNNEL RELEASE;  Surgeon: Daryll Brod, MD;  Location: [facility];  Service: Orthopedics;  Laterality: Right;  . CARPAL TUNNEL RELEASE Left 11/04/2015   Procedure: CARPAL TUNNEL RELEASE;  Surgeon: Daryll Brod, MD;  Location: [facility];  Service: Orthopedics;  Laterality: Left;  . CESAREAN SECTION    . OPEN REDUCTION INTERNAL FIXATION (ORIF) DISTAL RADIAL FRACTURE Left  11/04/2015   Procedure: OPEN REDUCTION INTERNAL FIXATION (ORIF) LEFT DISTAL RADIAL FRACTURE POSSIBLE BONE GRAFT;  Surgeon: Daryll Brod, MD;  Location: [facility];  Service: Orthopedics;  Laterality: Left;  . TRIGGER FINGER RELEASE Right 04/20/2015   Procedure: RELEASE TRIGGER FINGER/A-1 PULLEY RIGHT MIDDLE FINGER,RIGHT RING FINGER;  Surgeon: Daryll Brod, MD;  Location: [facility];  Service: Orthopedics;  Laterality: Right;  . TUBAL LIGATION    . ULNAR NERVE TRANSPOSITION Right 04/20/2015   Procedure: RIGHT ULNAR NERVE DECOMPRESSION;  Surgeon: Daryll Brod, MD;  Location: [facility];  Service: Orthopedics;  Laterality: Right;  . UTERINE FIBROID EMBOLIZATION  2007  . WRIST SURGERY     Cyst removed on left  . WRIST SURGERY Right 1985   tendon repair R wrist    There were no vitals filed for this visit.  Subjective Assessment - 12/31/18 1309    Subjective  Feeling okay today. Had some Lt-sided back pain after washing car.    Currently in Pain?  No/denies         [facility] PT Assessment - 12/31/18 0001      Assessment   Medical Diagnosis  Osteoarthritis involving multiple joints on both sides of body    Referring Provider (PT)  Andria Frames, Jamal Collin, MD      AROM   Lumbar Flexion  [facility]  Lumbar - Right Side Bend  stretch Left side    Lumbar - Left Side Bend  stretcht Rt side      Strength   Right Hip Flexion  5/5    Right Hip Extension  5/5    Right Hip ABduction  5/5    Left Hip Flexion  5/5    Left Hip Extension  5/5    Left Hip ABduction  5/5      Palpation   Palpation comment  mild TTP bil greater trochanter                   [facility] Adult PT Treatment/Exercise - 12/31/18 0001      Exercises   Other Exercises   reviewed HEP exercise list in Plan section of note      Knee/Hip Exercises: Aerobic   Nustep  5 min L4 UE & LE             PT Education - 12/31/18 1350    Education Details  goals, importance of continued  stretching and exercising.    Person(s) Educated  Patient    Methods  Explanation    Comprehension  Verbalized understanding       PT Short Term Goals - 12/17/18 1305      PT SHORT TERM GOAL #1   Title  Pt will be able to demonstrate proper form with short term HEP as it has been established      PT SHORT TERM GOAL #2   Title  Pt will be able to adjust her posture in a seated position & while driving to support lumbar spine    Baseline  reports she is doing well    Status  Achieved        PT Long Term Goals - 12/31/18 1343      PT LONG TERM GOAL #1   Title  Pt will be able to perform bending and lifting without a spike of LBP upon return to stand    Baseline  bending is okay, but lifting is still somewhat uncomfortable    Status  Partially Met      PT LONG TERM GOAL #2   Title  Pt will be able to get out of bed pain <=2/10    Baseline  no pain    Status  Achieved      PT LONG TERM GOAL #3   Title  pt will demo gross 5/5 lumbopelvic strength    Baseline  see flowsheet    Status  Achieved      PT LONG TERM GOAL #4   Title  Pt will be able to sleep comfortably    Status  Achieved            Plan - 12/31/18 1343    Clinical Impression Statement  Pt has met all of her goals at this time and is now d/c to independent HEP. Advised that she will have on and off back pain with ADLs and to be consistent with stretching and exercising. Encouraged her to contact me with any questions.    PT Treatment/Interventions  ADLs/Self Care Home Management;Cryotherapy;Traction;Moist Heat;Electrical Stimulation;Functional mobility training;Balance training;Therapeutic exercise;Therapeutic activities;Patient/family education;Manual techniques;Dry needling;Passive range of motion;Taping;Joint Manipulations;Spinal Manipulations;Iontophoresis '4mg'$ /ml Dexamethasone    PT Home Exercise Plan  tennis ball STM, ITB stretch, PPT with iso abd in hooklying; stretches: hamstrings, gastroc, figure4, LTR;  bridge ball squeeze; wall sits, PPT at wall; clam, dead bug ext, alt iso hip flx with  opp UE. sit to stand with gluts; wide tandem balance    Consulted and Agree with Plan of Care  Patient       Patient will benefit from skilled therapeutic intervention in order to improve the following deficits and impairments:  Improper body mechanics, Pain, Postural dysfunction, Increased muscle spasms, Decreased activity tolerance, Decreased strength, Difficulty walking  Visit Diagnosis: 1. Chronic bilateral low back pain without sciatica   2. Muscle weakness (generalized)        Problem List Patient Active Problem List   Diagnosis Date Noted  . Hypercalcemia 10/08/2018  . Seasonal allergies 08/27/2018  . Pre-op exam 06/11/2018  . Osteoarthritis involving multiple joints on both sides of body 05/25/2018  . Statin intolerance 12/26/2017  . PSVT (paroxysmal supraventricular tachycardia) ([place]) 11/09/2017  . Hyperlipidemia 08/30/2017  . OSA (obstructive sleep apnea) 08/31/2016  . Seizure ([place]) 06/08/2014  . Iron deficiency anemia 07/01/2012  . Diabetic retinopathy ([place]) 07/21/2011  . Asthma 04/04/2010  . GERD 12/30/2007  . ARTHRITIS, KNEE 09/17/2007  . Diabetic polyneuropathy ([place]) 08/30/2006  . Type 2 diabetes mellitus without complication, without long-term current use of insulin ([place]) 08/30/2006  . HYPERCHOLESTEROLEMIA 08/30/2006  . HEARING LOSS NOS OR DEAFNESS 08/30/2006   PHYSICAL THERAPY DISCHARGE SUMMARY  Visits from Start of Care: 9  Current functional level related to goals / functional outcomes: See above   Remaining deficits: See above   Education / Equipment: Anatomy of condition, POC, HEP, exercise form/rationale  Plan: Patient agrees to discharge.  Patient goals were met. Patient is being discharged due to meeting the stated rehab goals.  ?????      Karmella Bouvier C. Glennette Galster PT, DPT 12/31/18 1:51 PM   [facility] [address] [place], [place], [address] Phone: [phone]   Fax:  [phone]  Name: DELISHA PEADEN MRN: [id number] Date of Birth: 02/23/1956

## 2019-01-01 ENCOUNTER — Ambulatory Visit: Payer: Medicare Other | Admitting: Student in an Organized Health Care Education/Training Program

## 2019-01-02 ENCOUNTER — Ambulatory Visit: Payer: Medicare Other | Admitting: Physical Therapy

## 2019-01-07 ENCOUNTER — Other Ambulatory Visit: Payer: Self-pay

## 2019-01-07 ENCOUNTER — Encounter: Payer: Self-pay | Admitting: Family Medicine

## 2019-01-07 ENCOUNTER — Ambulatory Visit: Payer: Medicare Other | Admitting: Physical Therapy

## 2019-01-07 ENCOUNTER — Ambulatory Visit (INDEPENDENT_AMBULATORY_CARE_PROVIDER_SITE_OTHER): Payer: Medicare Other | Admitting: Family Medicine

## 2019-01-07 VITALS — BP 140/72 | HR 84

## 2019-01-07 DIAGNOSIS — H269 Unspecified cataract: Secondary | ICD-10-CM

## 2019-01-07 DIAGNOSIS — Z0000 Encounter for general adult medical examination without abnormal findings: Secondary | ICD-10-CM

## 2019-01-07 DIAGNOSIS — H903 Sensorineural hearing loss, bilateral: Secondary | ICD-10-CM

## 2019-01-07 DIAGNOSIS — E11311 Type 2 diabetes mellitus with unspecified diabetic retinopathy with macular edema: Secondary | ICD-10-CM

## 2019-01-07 DIAGNOSIS — I10 Essential (primary) hypertension: Secondary | ICD-10-CM

## 2019-01-07 DIAGNOSIS — R238 Other skin changes: Secondary | ICD-10-CM | POA: Insufficient documentation

## 2019-01-07 DIAGNOSIS — E1142 Type 2 diabetes mellitus with diabetic polyneuropathy: Secondary | ICD-10-CM

## 2019-01-07 DIAGNOSIS — E119 Type 2 diabetes mellitus without complications: Secondary | ICD-10-CM | POA: Diagnosis not present

## 2019-01-07 DIAGNOSIS — E782 Mixed hyperlipidemia: Secondary | ICD-10-CM | POA: Diagnosis not present

## 2019-01-07 DIAGNOSIS — R233 Spontaneous ecchymoses: Secondary | ICD-10-CM | POA: Insufficient documentation

## 2019-01-07 DIAGNOSIS — D649 Anemia, unspecified: Secondary | ICD-10-CM | POA: Diagnosis not present

## 2019-01-07 HISTORY — DX: Unspecified cataract: H26.9

## 2019-01-07 LAB — POCT GLYCOSYLATED HEMOGLOBIN (HGB A1C): HbA1c, POC (controlled diabetic range): 7 % (ref 0.0–7.0)

## 2019-01-07 NOTE — Assessment & Plan Note (Signed)
Follows with ophthalmology at this time.  Will need to obtain records.  Patient reports going in for monthly injections since earlier this year to prepare for laser surgery as she has "leaking out of her vessels".  Her A1c is stable at 7.0.  She is doing very well with medications and blood sugar levels.  We will not make any changes to her diabetic regimen today, which includes NovoLog, glargine, Victoza, metformin.  Patient also already on losartan.  No statin use due to intolerance.

## 2019-01-07 NOTE — Patient Instructions (Signed)
Dear Alicia [name],   It was very nice to see you! Thank you for taking your time to come in to be seen. Today, we discussed the following:   Hands and feet   Continue to take the gabapentin. Do not stop taking this medication abruptly.  I will refer you to neurology   Diabetes   No changes-- GREAT WORK ON KEEPING YOUR SUGARS IN CONTROL!!   High Blood Pressure   Take your blood pressure at least once weekly. If you are consistently greater than 140/90, please let us know and schedule a follow up. Continue with the low salt diet.   I will send you a message on mychart about your labs and any issues.  Please let us know if you have any problems.   Please follow up in 3 months or 6 months if your blood pressures and sugars are stable at home.    Be well,   Dr. Zettie Cooley [facility] [phone]   Sign up for MyChart for instant access to your health profile, labs, orders, upcoming appointments or to contact your provider with questions.

## 2019-01-07 NOTE — Assessment & Plan Note (Signed)
Appears to be moderately controlled.  Patient does not take her blood pressures often at home and is encouraged to take them at least once weekly.  If blood pressures are above 140/90 on average, patient will return to clinic for further evaluation.  Patient's blood pressure regimen includes losartan, hydrochlorothiazide, carvedilol, ASA.

## 2019-01-07 NOTE — Progress Notes (Signed)
Established Patient - Clinic Visit Subjective  Subjective  Patient ID: MRN [id number]   Date of birth: 08/12/1955    PCP: Stark Klein, MD Name: Alicia [name], 63 y.o. female  CC: Diabetes and Foot Pain (worse in the morning and gabapentin not working)  Tingling in hands and feet  Been taking the medication everyday, but it does not seem to help very much. Feels like she needs to scratch or itchy like bugs crawling on her. In her hands, it radiates to her fingers.  Feeling comes up to her knees when she drives. When this happens, she gets upset and anxious. This is every day. The worse time is at midnight and has been progressively worsening. She does not have the symptoms now. In the morning it is not bad, throughout the afternoon, it will start at random times.  Becoming intolerable. Started around 6-7 years ago and has been taking the medicine which has been increasing in amount over time. Her daughter has been worried about it. Has moved to the backs of her ankles about a week or so ago. Not worsened with showers, hot water, being outside, walking. No history of autoimmune disorders.   Diabetes At home, patient checks sugar three times a day. Sugars throughout the day range from  AM fasting sugars average at 108 and have been as low as 77. Has had episodes of hypoglycemia, about once a month. If <120, don't take insulin. 8u of basaglar every night and sliding scale with each with victoza every morning. Does not have to take novolog everyday, but does use it enough to not be able to count the number of meals she uses it at. Two weeks ago saw the eye doctor. Will return at the end of July for further follow up for ultimate lase surgery 2/2 diabetic issues. Unsure of how often she goes over 200. Additionally, patient takes mobic for back pain.   HTN: Patient reports checking "once in a while".  She reports that last Wednesday has 70/45.  She was not symptomatic but did drink water as her  friend told her to. The next morning, 120/80.  Patient is on a low-salt diet and uses Mrs. Dash.  Patient has vision issues that are currently being addressed with her ophthalmologist and plans to have retinal laser surgery in the coming months.  She denies any chest pain, headache, shortness of breath.  Low back pain Daily Mobic use.  Currently with physical therapy  Iron deficiency  No constipation.  Taking iron supplements (unknown dose) twice daily with meals.  ROS: See HPI  HISTORY Meds   Allergies: Reviewed as appropriate   Social Hx: Alicia [name] reports that she has never smoked. She has never used smokeless tobacco. She reports current alcohol use of about 3.0 standard drinks of alcohol per week. She reports that she does not use drugs.     Objective   Objective  Physical Exam:  BP 140/72    Pulse 84    SpO2 96%  General: NAD, non-toxic, well-appearing, sitting comfortably in chair, accompanied by ASL Interpretor    HEENT: [place]/AT. PERRLA. EOMI.  Cardiovascular: RRR, normal S1, S2. B/L 2+ RP. No BLEE Respiratory: CTAB. No IWOB.  Abdomen: + BS. NT, ND, soft to palpation.  Extremities: Warm and well perfused. Moving spontaneously.  Integumentary: No obvious rashes, lesions, trauma on general exam. Normal exam of feet and hands. Neuro: CN grossly intact. No FND. No sensory or motor deficits appreciated in distal extremities.  Pertinent Labs & Imaging:  Results for Alicia, [name] (MRN [id number]) as of 01/07/2019 12:58  10/08/2018 10:15  Iron 14 (L)  UIBC 284  TIBC 298  Ferritin 7 (L)  Iron Saturation 5 (LL)   A1C 7.0 PTH 66, Ca 10.0   Assessment  Assessment & Plan  Type 2 diabetes mellitus with ophthalmic complication ([place]) Follows with ophthalmology at this time.  Will need to obtain records.  Patient reports going in for monthly injections since earlier this year to prepare for laser surgery as she has "leaking out of her vessels".  Her A1c is stable at 7.0.  She is doing  very well with medications and blood sugar levels.  We will not make any changes to her diabetic regimen today, which includes NovoLog, glargine, Victoza, metformin.  Patient also already on losartan.  No statin use due to intolerance.  Hearing loss ASL interpreter present in today's visit  Diabetic polyneuropathy Patient is currently on 400 mg gabapentin 3 times daily.  She has not experienced any relief from her symptoms.  She reports that she has been on increasing doses of gabapentin for the last several years.  When she describes her symptoms, she describes the sensation itches and that she must scratch, like there are bugs crawling on her.  Patient's diabetes is well controlled and I am not sure that this is a polyneuropathy problem.  Unlikely to be an infectious cause as this is chronic.  Unclear sensory neuropathy.  We will continue patient on gabapentin, will have to be weaned off if other etiologies exist, and will refer to neurology for further work-up.  Hyperlipidemia Repeat lipid panel today.  Encourage patient to practice good diet habits as she is intolerant to statins and has limited options for therapy.  Hypertension Appears to be moderately controlled.  Patient does not take her blood pressures often at home and is encouraged to take them at least once weekly.  If blood pressures are above 140/90 on average, patient will return to clinic for further evaluation.  Patient's blood pressure regimen includes losartan, hydrochlorothiazide, carvedilol, ASA.  Follow-up:  Future Appointments  Date Time Provider [place]  01/29/2019 11:20 AM [facility] 2 [facility]      Wilber Oliphant, M.D.  PGY-2   Family Medicine  01/07/2019 1:17 PM

## 2019-01-07 NOTE — Assessment & Plan Note (Signed)
Repeat lipid panel today.  Encourage patient to practice good diet habits as she is intolerant to statins and has limited options for therapy.

## 2019-01-07 NOTE — Assessment & Plan Note (Signed)
Patient is currently on 400 mg gabapentin 3 times daily.  She has not experienced any relief from her symptoms.  She reports that she has been on increasing doses of gabapentin for the last several years.  When she describes her symptoms, she describes the sensation itches and that she must scratch, like there are bugs crawling on her.  Patient's diabetes is well controlled and I am not sure that this is a polyneuropathy problem.  Unlikely to be an infectious cause as this is chronic.  Unclear sensory neuropathy.  We will continue patient on gabapentin, will have to be weaned off if other etiologies exist, and will refer to neurology for further work-up.

## 2019-01-07 NOTE — Assessment & Plan Note (Signed)
ASL interpreter present in today's visit

## 2019-01-08 LAB — BASIC METABOLIC PANEL
BUN/Creatinine Ratio: 21 (ref 12–28)
BUN: 15 mg/dL (ref 8–27)
CO2: 23 mmol/L (ref 20–29)
Calcium: 10.3 mg/dL (ref 8.7–10.3)
Chloride: 101 mmol/L (ref 96–106)
Creatinine, Ser: 0.72 mg/dL (ref 0.57–1.00)
GFR calc Af Amer: 103 mL/min/{1.73_m2} (ref >59)
GFR calc non Af Amer: 89 mL/min/{1.73_m2} (ref >59)
Glucose: 173 mg/dL — ABNORMAL HIGH (ref 65–99)
Potassium: 4.2 mmol/L (ref 3.5–5.2)
Sodium: 140 mmol/L (ref 134–144)

## 2019-01-08 LAB — CBC
Hematocrit: 33 % — ABNORMAL LOW (ref 34.0–46.6)
Hemoglobin: 11.4 g/dL (ref 11.1–15.9)
MCH: 29.2 pg (ref 26.6–33.0)
MCHC: 34.5 g/dL (ref 31.5–35.7)
MCV: 84 fL (ref 79–97)
Platelets: 220 10*3/uL (ref 150–450)
RBC: 3.91 x10E6/uL (ref 3.77–5.28)
RDW: 14.3 % (ref 11.7–15.4)
WBC: 6.8 10*3/uL (ref 3.4–10.8)

## 2019-01-08 LAB — LIPID PANEL
Chol/HDL Ratio: 2.4 ratio (ref 0.0–4.4)
Cholesterol, Total: 222 mg/dL — ABNORMAL HIGH (ref 100–199)
HDL: 91 mg/dL (ref >39)
LDL Calculated: 122 mg/dL — ABNORMAL HIGH (ref 0–99)
Triglycerides: 44 mg/dL (ref 0–149)
VLDL Cholesterol Cal: 9 mg/dL (ref 5–40)

## 2019-01-09 ENCOUNTER — Encounter: Payer: Medicare Other | Admitting: Physical Therapy

## 2019-01-09 DIAGNOSIS — E113591 Type 2 diabetes mellitus with proliferative diabetic retinopathy without macular edema, right eye: Secondary | ICD-10-CM | POA: Diagnosis not present

## 2019-01-09 DIAGNOSIS — E113592 Type 2 diabetes mellitus with proliferative diabetic retinopathy without macular edema, left eye: Secondary | ICD-10-CM | POA: Diagnosis not present

## 2019-01-09 DIAGNOSIS — H35371 Puckering of macula, right eye: Secondary | ICD-10-CM | POA: Diagnosis not present

## 2019-01-09 DIAGNOSIS — H35363 Drusen (degenerative) of macula, bilateral: Secondary | ICD-10-CM | POA: Diagnosis not present

## 2019-01-14 ENCOUNTER — Other Ambulatory Visit: Payer: Self-pay | Admitting: Family Medicine

## 2019-01-14 ENCOUNTER — Other Ambulatory Visit: Payer: Self-pay | Admitting: Student in an Organized Health Care Education/Training Program

## 2019-01-14 DIAGNOSIS — Z794 Long term (current) use of insulin: Secondary | ICD-10-CM

## 2019-01-14 DIAGNOSIS — E119 Type 2 diabetes mellitus without complications: Secondary | ICD-10-CM

## 2019-01-14 NOTE — Telephone Encounter (Signed)
Please call patient and verify her Victoza dosing. I have a prescription request for her and would be happy to refill it once I have the correct dose to prescribe.   Thank you,  Dr. Rosita Fire

## 2019-01-16 NOTE — Telephone Encounter (Signed)
Pt called to check status.  She is taking 0.6mg  daily.  She only has one dose left.  Will forward to MD.  Christen Bame, CMA

## 2019-01-19 ENCOUNTER — Other Ambulatory Visit: Payer: Self-pay | Admitting: Physician Assistant

## 2019-01-20 ENCOUNTER — Telehealth: Payer: Medicare Other | Admitting: Family Medicine

## 2019-01-20 ENCOUNTER — Other Ambulatory Visit: Payer: Self-pay

## 2019-01-20 NOTE — Progress Notes (Signed)
Called no answer. Left message to call back. Sign language interpreter services were used over the phone.

## 2019-01-21 ENCOUNTER — Other Ambulatory Visit: Payer: Self-pay | Admitting: *Deleted

## 2019-01-21 DIAGNOSIS — Z20822 Contact with and (suspected) exposure to covid-19: Secondary | ICD-10-CM

## 2019-01-23 DIAGNOSIS — E113592 Type 2 diabetes mellitus with proliferative diabetic retinopathy without macular edema, left eye: Secondary | ICD-10-CM | POA: Diagnosis not present

## 2019-01-26 LAB — NOVEL CORONAVIRUS, NAA: SARS-CoV-2, NAA: NOT DETECTED

## 2019-01-28 ENCOUNTER — Other Ambulatory Visit: Payer: Self-pay | Admitting: Student in an Organized Health Care Education/Training Program

## 2019-01-28 DIAGNOSIS — M159 Polyosteoarthritis, unspecified: Secondary | ICD-10-CM

## 2019-01-28 NOTE — Telephone Encounter (Signed)
Informed patient of negative results.  Alicia [name], [place]

## 2019-01-29 ENCOUNTER — Ambulatory Visit: Payer: Medicare Other

## 2019-01-29 ENCOUNTER — Telehealth: Payer: Self-pay | Admitting: *Deleted

## 2019-01-29 DIAGNOSIS — E119 Type 2 diabetes mellitus without complications: Secondary | ICD-10-CM

## 2019-01-29 DIAGNOSIS — Z794 Long term (current) use of insulin: Secondary | ICD-10-CM

## 2019-01-29 MED ORDER — VICTOZA 18 MG/3ML ~~LOC~~ SOPN: 0.6000 mg | PEN_INJECTOR | SUBCUTANEOUS | 3 refills | Status: DC

## 2019-01-29 NOTE — Telephone Encounter (Signed)
Pt calls because she only received one Januvia pen this time.    She typically get a 3 month supply (3 Pens) at at time.   It cost her $8 for one pen OR $8 for 3 pens so she would prefer to have 3 called in.  A new script would need to be sent.   Sending to PCP and Dr. Owens Shark who sent in previous script.  Christen Bame, CMA

## 2019-01-29 NOTE — Telephone Encounter (Signed)
New prescription for 3 pens sent to pharmacy.   Thank you!

## 2019-01-30 DIAGNOSIS — H3582 Retinal ischemia: Secondary | ICD-10-CM | POA: Diagnosis not present

## 2019-01-30 DIAGNOSIS — E113591 Type 2 diabetes mellitus with proliferative diabetic retinopathy without macular edema, right eye: Secondary | ICD-10-CM | POA: Diagnosis not present

## 2019-01-30 DIAGNOSIS — H35371 Puckering of macula, right eye: Secondary | ICD-10-CM | POA: Diagnosis not present

## 2019-01-30 DIAGNOSIS — E113512 Type 2 diabetes mellitus with proliferative diabetic retinopathy with macular edema, left eye: Secondary | ICD-10-CM | POA: Diagnosis not present

## 2019-02-04 ENCOUNTER — Other Ambulatory Visit: Payer: Self-pay | Admitting: Family Medicine

## 2019-02-04 DIAGNOSIS — D649 Anemia, unspecified: Secondary | ICD-10-CM

## 2019-02-12 ENCOUNTER — Other Ambulatory Visit: Payer: Self-pay | Admitting: Physician Assistant

## 2019-02-21 ENCOUNTER — Other Ambulatory Visit: Payer: Self-pay

## 2019-02-24 ENCOUNTER — Encounter: Payer: Self-pay | Admitting: Neurology

## 2019-02-24 ENCOUNTER — Ambulatory Visit (INDEPENDENT_AMBULATORY_CARE_PROVIDER_SITE_OTHER): Payer: Medicare Other | Admitting: Neurology

## 2019-02-24 ENCOUNTER — Other Ambulatory Visit: Payer: Self-pay

## 2019-02-24 VITALS — BP 160/76 | HR 88 | Temp 98.0°F | Ht 63.0 in | Wt 160.0 lb

## 2019-02-24 DIAGNOSIS — L299 Pruritus, unspecified: Secondary | ICD-10-CM | POA: Diagnosis not present

## 2019-02-24 DIAGNOSIS — G629 Polyneuropathy, unspecified: Secondary | ICD-10-CM | POA: Diagnosis not present

## 2019-02-24 MED ORDER — ALPHA-LIPOIC ACID 600 MG PO TABS: 600.0000 mg | ORAL_TABLET | Freq: Every day | ORAL | 6 refills | Status: DC

## 2019-02-24 NOTE — Progress Notes (Signed)
[facility]    Provider:  Dr Jaynee Eagles Requesting Provider: Raiford Simmonds. Owens Shark MD Primary Care Provider:  Stark Klein, MD  CC:  Nerve pain  HPI:  Alicia [name] is a 63 y.o. female here as requested by Stark Klein, MD for diabetic polyneuropathy.  She has a past medical history of hearing loss, diabetes type 2 with multiple complications including ophthalmic and neurologic with diabetic polyneuropathy, paroxysmal supraventricular tachycardia, hypertension, hyperlipidemia, gastroparesis, complete deafness, arthritis. She is here with an interpreter, if she get upset she starts itching, her feet and hands are itching, has been ongoing for several years, she is afraid she is going to get addicted to the medication, she is not getting better. It is not her skin, its inside, its her nerves. No dry or rash, she has to rub her feet on the ground, its inside. Symptoms started years ago. She has slowly increased her gabapentin dosage. She doesn't want to take the medicine. It used to help. The symptoms are not continuous, comes and goes.it can last for several hours longer at night, it is driving her crazy, she has to rub her hands together. It is worse at night, worse with being upset. Any reason. It can be one finger, one part of the hand, moves around.Can be on the heel only and moves around. No burning. No tingling. No numbness. No rashes. No FHx of neuropathy or autoimmune disease. No other focal neurologic deficits, associated symptoms, inciting events or modifiable factors.  Reviewed notes, labs and imaging from outside physicians, which showed:  I reviewed MRI of the brain images from 2016 which was unremarkable essentially normal for age.  Hemoglobin A1c 7, BMP showed elevated glucose, CBC unremarkable.  I reviewed requesting physician and her resident (Dr. Rosita Fire and Dr. Roosvelt Harps) notes.  She has type 2 diabetes with ophthalmic complications.  She is pending for later  surgery.  Her hemoglobin A1c has been stable at 7.  She is on long-term insulin.  No statin use due to intolerance.  She is currently on 400 mg gabapentin 3 times daily.  She has not experienced relief from her symptoms.  She has been on increasing doses of gabapentin for last several years.  She describes a sensation of itchiness, like there are bugs crawling on her, unclear sensory neuropathy.  She was referred here for other causes of neuropathy.  Review of Systems: Patient complains of symptoms per HPI as well as the following symptoms: itching. Pertinent negatives and positives per HPI. All others negative.   Social History   Socioeconomic History   Marital status: Divorced    Spouse name: Not on file   Number of children: 3   Years of education: 12   Highest education level: Not on file  Occupational History   Occupation: daycare    Employer: [employer] resource strain: Not on file   Food insecurity    Worry: Not on file    Inability: Not on file   Transportation needs    Medical: Not on file    Non-medical: Not on file  Tobacco Use   Smoking status: Never Smoker   Smokeless tobacco: Never Used  Substance and Sexual Activity   Alcohol use: Yes    Alcohol/week: 3.0 standard drinks    Types: 3 Glasses of wine per week    Comment: 1-2 wine occasional    Drug use: Never   Sexual activity: Yes    Birth  control/protection: Surgical  Lifestyle   Physical activity    Days per week: Not on file    Minutes per session: Not on file   Stress: Not on file  Relationships   Social connections    Talks on phone: Not on file    Gets together: Not on file    Attends religious service: Not on file    Active member of club or organization: Not on file    Attends meetings of clubs or organizations: Not on file    Relationship status: Not on file   Intimate partner violence    Fear of current or ex partner: Not on file     Emotionally abused: Not on file    Physically abused: Not on file    Forced sexual activity: Not on file  Other Topics Concern   Not on file  Social History Narrative   Lives at home with daughter   Right handed   Caffeine: often    Family History  Problem Relation Age of Onset   Hypertension Mother    Heart attack Mother 67   Diabetes Father    Cancer Maternal Aunt    Cancer Maternal Grandmother    Neuropathy Neg Hx     Past Medical History:  Diagnosis Date   Anemia    ARTHRITIS, KNEE 09/17/2007   Asthma 04/04/2010   Cataract 01/07/2019   Closed fracture of distal end of left radius 11/01/2015   Complete deafness    Meningitis at age 55   Deaf    Diabetes mellitus    Diabetic neuropathy ([place])    Ganglion cyst 06/22/2011   Gastroparesis    GERD (gastroesophageal reflux disease)    H/O: C-section    Hyperlipidemia    Hypertension    Neuromuscular disorder ([place])    diabetic neuropathy   PSVT (paroxysmal supraventricular tachycardia) ([place]) 11/09/2017   Event monitor 09/14/2017 - Predominantly sinus rhythm with episodes of narrow-complex tachycardia suggestive of paroxysmal supraventricular tachycardia.   S/P appy     Patient Active Problem List   Diagnosis Date Noted   Hypertension 01/07/2019   Hypercalcemia 10/08/2018   Seasonal allergies 08/27/2018   Osteoarthritis involving multiple joints on both sides of body 05/25/2018   Statin intolerance 12/26/2017   PSVT (paroxysmal supraventricular tachycardia) ([place]) 11/09/2017   Hyperlipidemia 08/30/2017   OSA (obstructive sleep apnea) 08/31/2016   Diabetes ([place]) 09/14/2015   Seizure ([place]) 06/08/2014   Iron deficiency anemia 07/01/2012   Diabetic retinopathy ([place]) 07/21/2011   GERD 12/30/2007   Diabetic polyneuropathy ([place]) 08/30/2006   Type 2 diabetes mellitus with ophthalmic complication ([place]) 76/16/0737   HYPERCHOLESTEROLEMIA 08/30/2006   Hearing loss 08/30/2006    Past  Surgical History:  Procedure Laterality Date   APPENDECTOMY     cardiolyte EF 77%, no ischemia in 2006  2006   [place] Right 04/20/2015   Procedure: RIGHT CARPAL TUNNEL RELEASE;  Surgeon: Daryll Brod, MD;  Location: [facility];  Service: Orthopedics;  Laterality: Right;   CARPAL TUNNEL RELEASE Left 11/04/2015   Procedure: CARPAL TUNNEL RELEASE;  Surgeon: Daryll Brod, MD;  Location: [place];  Service: Orthopedics;  Laterality: Left;   CESAREAN SECTION     OPEN REDUCTION INTERNAL FIXATION (ORIF) DISTAL RADIAL FRACTURE Left 11/04/2015   Procedure: OPEN REDUCTION INTERNAL FIXATION (ORIF) LEFT DISTAL RADIAL FRACTURE POSSIBLE BONE GRAFT;  Surgeon: Daryll Brod, MD;  Location: [facility];  Service: Orthopedics;  Laterality: Left;   TRIGGER  FINGER RELEASE Right 04/20/2015   Procedure: RELEASE TRIGGER FINGER/A-1 PULLEY RIGHT MIDDLE FINGER,RIGHT RING FINGER;  Surgeon: Daryll Brod, MD;  Location: [place] [facility];  Service: Orthopedics;  Laterality: Right;   TUBAL LIGATION     ULNAR NERVE TRANSPOSITION Right 04/20/2015   Procedure: RIGHT ULNAR NERVE DECOMPRESSION;  Surgeon: Daryll Brod, MD;  Location: [facility];  Service: Orthopedics;  Laterality: Right;   UTERINE FIBROID EMBOLIZATION  2007   WRIST SURGERY     Cyst removed on left   WRIST SURGERY Right 1985   tendon repair R wrist    Current Outpatient Medications  Medication Sig Dispense Refill   ACCU-CHEK SOFTCLIX LANCETS lancets TEST 4 TIMES A DAY 200 each 0   acetaminophen (TYLENOL) 500 MG tablet Take 500 mg by mouth every 4 (four) hours as needed.     aspirin (BAYER CHILDRENS ASPIRIN) 81 MG chewable tablet Chew 81 mg by mouth daily.       carvedilol (COREG) 25 MG tablet Take 1 tablet (25 mg total) by mouth 2 (two) times daily with a meal. Please schedule appt for further final  attempt 15 tablet 0   cholecalciferol (VITAMIN D) 1000 UNITS tablet Take  1,000 Units by mouth daily.     cyanocobalamin 500 MCG tablet Take 500 mcg by mouth daily.     erythromycin ophthalmic ointment Place a 1/2 inch ribbon of ointment into the left lower eyelid four times daily for 5 days 1 g 0   ferrous sulfate 325 (65 FE) MG tablet PLEASE SEE ATTACHED FOR DETAILED DIRECTIONS 180 tablet 0   fluticasone (FLONASE) 50 MCG/ACT nasal spray SPRAY 2 SPRAYS INTO EACH NOSTRIL EVERY DAY 16 g 3   gabapentin (NEURONTIN) 400 MG capsule TAKE 3 CAPSULES BY MOUTH TWICE A DAY AS NEEDED FOR PAIN 540 capsule 2   glucose blood test strip CHECK SUGAR 3 TIMES A DAILY. 100 each 12   hydrochlorothiazide (HYDRODIURIL) 12.5 MG tablet TAKE 1 TABLET BY MOUTH EVERY DAY 90 tablet 3   Insulin Glargine (BASAGLAR KWIKPEN) 100 UNIT/ML SOPN INJECT 8 UNITS INTO THE SKIN DAILY. 15 pen 3   Insulin Pen Needle 31G X 5 MM MISC 1 Container by Does not apply route once as needed for up to 1 dose. 100 each 11   liraglutide (VICTOZA) 18 MG/3ML SOPN Inject 0.1 mLs (0.6 mg total) into the skin 1 day or 1 dose for 120 doses. 3 pen 3   losartan (COZAAR) 100 MG tablet TAKE 1 TABLET BY MOUTH EVERY DAY 90 tablet 3   meloxicam (MOBIC) 15 MG tablet TAKE 1 TABLET BY MOUTH EVERY DAY 30 tablet 1   metFORMIN (GLUCOPHAGE) 1000 MG tablet TAKE 1 TABLET BY MOUTH TWICE A DAY 180 tablet 3   NOVOLOG 100 UNIT/ML injection INJECT 1-4 UNITS INTO THE SKIN 3 TIMES DAILY WITH MEALS. 30 mL 0   omeprazole (PRILOSEC) 20 MG capsule TAKE 1 CAPSULE BY MOUTH EVERY DAY 90 capsule 1   polyethylene glycol powder (GLYCOLAX/MIRALAX) powder Take 17 g by mouth 3 (three) times daily as needed. 3350 g 1   senna-docusate (SENOKOT-S) 8.6-50 MG tablet Take 1 tablet by mouth daily as needed for mild constipation or moderate constipation. 30 tablet 0   Alpha-Lipoic Acid 600 MG TABS Take 600 mg by mouth daily. 30 tablet 6   Current Facility-Administered Medications  Medication Dose Route Frequency Provider Last Rate Last Dose    fexofenadine (ALLEGRA) tablet 60 mg  60 mg Oral BID Jeannine Kitten,  Garen Lah, MD        Allergies as of 02/24/2019 - Review Complete 02/24/2019  Allergen Reaction Noted   Sulfonamide derivatives Swelling and Rash 12/07/2006   Lipitor [atorvastatin calcium] Other (See Comments) 09/13/2010   Ramipril Other (See Comments) 40/81/4481   Trulicity [dulaglutide] Nausea And Vomiting 09/08/2016   Atorvastatin Other (See Comments) 05/26/2015   Sulfamethoxazole Other (See Comments) 05/26/2015    Vitals: BP (!) 160/76 (BP Location: Right Arm, Patient Position: Sitting)    Pulse 88    Temp 98 F (36.7 C) Comment: taken by check-in staff   Ht '5\' 3"'$  (1.6 m)    Wt 160 lb (72.6 kg)    BMI 28.34 kg/m  Last Weight:  Wt Readings from Last 1 Encounters:  02/24/19 160 lb (72.6 kg)   Last Height:   Ht Readings from Last 1 Encounters:  02/24/19 '5\' 3"'$  (1.6 m)     Physical exam: Exam: Gen: NAD                   CV: RRR, no MRG. No Carotid Bruits. No peripheral edema, warm, nontender Eyes: Conjunctivae clear without exudates or hemorrhage  Neuro: Detailed Neurologic Exam  Speech:     Non verbal, sign language  Cognition:    The patient is oriented to person, place, and time;     recent and remote memory intact;     language fluent;     normal attention, concentration,     fund of knowledge Cranial Nerves:    The pupils are equal, round, and reactive to light. Attempted fundoscopic exam could not visualize. Visual fields are full to finger confrontation. Extraocular movements are intact. Trigeminal sensation is intact and the muscles of mastication are normal. The face is symmetric. The palate elevates in the midline. Hearing impaired. Voice is normal. Shoulder shrug is normal. The tongue has normal motion without fasciculations.   Coordination:    Normal finger to nose   Gait:    Heel-toe and tandem gait are normal with mild imbalance   Motor Observation:    No asymmetry, no atrophy, and no  involuntary movements noted. Tone:    Normal muscle tone.    Posture:    Posture is normal. normal erect    Strength:    Strength is V/V in the upper and lower limbs. Pain in the left wrist due to prior fracture.      Sensation: intact to LT, pin prick, vibration distally     Reflex Exam:  DTR's: Absent Ajs otherwise deep tendon reflexes in the upper and lower extremities are normal bilaterally.   Toes:    The toes are downgoing bilaterally.   Clonus:    Clonus is absent.    Assessment/Plan: This is a 63 year old with long-term diabetes on long-term insulin with multiple complications from her diabetes including polyneuropathy and ophthalmic complications as well as possible gastroparesis secondary to diabetes.  Sent over here for evaluation.  -Patient was sent over with a question of whether or not this is diabetes-related polyneuropathy considering her hemoglobin A1c has been stable.  Unfortunately, many years of stable diabetes or even prediabetes can still cause a peripheral diabetic polyneuropathy.  Cannot rule out other causes however.  We will look for other causes today as the symptoms are unusual for diabetic neuropathy as it is only itchiness, she is totally fine right now, comes and goes, worse when she is upset. It can be one finger, one part of the hand,  moves around.Can be on the heel only and moves around. May consider Rheumatologic or dermatologic or even evaluation for allergic cause evaluation if no other cause found in neurology. Still, we do see symptoms of itchy skin as a symptom of neuropathy albeit much less often.  -Continue gabapentin or may consider Cymbalta or Lyrica or other medications for management.  I will suggest alpha lipoic acid today 600 mg a day.  If no other cause found for her neuropathy that needs to be addressed by neurology, patient can return to primary care for for further workup and management.   - Emg/ncs: I'm not sure if emg/ncs will tell us  anything more than we already know. If it is positive for neuropathy then that won;t be unusual as she has long-standing diabetes, and if normal then she still could have a small-fiber neuropathy. I'm not aware of any other unusual neuropathies, demyelinating or otherwise, that present solely with itching.  Orders Placed This Encounter  Procedures   Vitamin B1   TSH   B12 and Folate Panel   Vitamin B6   Multiple Myeloma Panel (SPEP&IFE w/QIG)   Methylmalonic acid, serum   Meds ordered this encounter  Medications   Alpha-Lipoic Acid 600 MG TABS    Sig: Take 600 mg by mouth daily.    Dispense:  30 tablet    Refill:  6    Cc: Stark Klein, MD,  Stark Klein, MD  Sarina Ill, MD  [facility] [address] [place],  [address]  Phone [phone] Fax [phone]

## 2019-02-24 NOTE — Patient Instructions (Addendum)
-May consider daily alpha lipoic acid which is an antioxidant that may reduce free radical oxidative stress associated with diabetic polyneuropathy, existing evidence suggests that alpha lipoic acid significantly reduces stabbing, lancinating and burning pain and diabetic neuropathy with its onset of action as early as 1-2 weeks. 600mg  a day.  [place] today   Allergies, Adult An allergy is when your body's defense system (immune system) overreacts to an otherwise harmless substance (allergen) that you breathe in or eat or something that touches your skin. When you come into contact with something that you are allergic to, your immune system produces certain proteins (antibodies). These proteins cause cells to release chemicals (histamines) that trigger the symptoms of an allergic reaction. Allergies often affect the nasal passages (allergic rhinitis), eyes (allergic conjunctivitis), skin (atopic dermatitis), and stomach. Allergies can be mild or severe. Allergies cannot spread from person to person (are not contagious). They can develop at any age and may be outgrown. What increases the risk? You may be at greater risk of allergies if other people in your family have allergies. What are the signs or symptoms? Symptoms depend on what type of allergy you have. They may include:  Runny, stuffy nose.  Sneezing.  Itchy mouth, ears, or throat or skin.  Postnasal drip.  Sore throat.  Itchy, red, watery, or puffy eyes.  Skin rash or hives.  Stomach pain.  Vomiting.  Diarrhea.  Bloating.  Wheezing or coughing. People with a severe allergy to food, medicine, or an insect bite may have a life-threatening allergic reaction (anaphylaxis). Symptoms of anaphylaxis include:  Hives.  Itching.  Flushed face.  Swollen lips, tongue, or mouth.  Tight or swollen throat.  Chest pain or tightness in the chest.  Trouble breathing or shortness of breath.  Rapid heartbeat.  Dizziness or  fainting.  Vomiting.  Diarrhea.  Pain in the abdomen. How is this diagnosed? This condition is diagnosed based on:  Your symptoms.  Your family and medical history.  A physical exam. You may need to see a health care provider who specializes in treating allergies (allergist). You may also have tests, including:  Skin tests to see which allergens are causing your symptoms, such as: ? Skin prick test. In this test, your skin is pricked with a tiny needle and exposed to small amounts of possible allergens to see if your skin reacts. ? Intradermal skin test. In this test, a small amount of allergen is injected under your skin to see if your skin reacts. ? Patch test. In this test, a small amount of allergen is placed on your skin and then your skin is covered with a bandage. Your health care provider will check your skin after a couple of days to see if a rash has developed.  Blood tests.  Challenges tests. In this test, you inhale a small amount of allergen by mouth to see if you have an allergic reaction. You may also be asked to:  Keep a food diary. A food diary is a record of all the foods and drinks you have in a day and any symptoms you experience.  Practice an elimination diet. An elimination diet involves eliminating specific foods from your diet and then adding them back in one by one to find out if a certain food causes an allergic reaction. How is this treated? Treatment for allergies depends on your symptoms. Treatment may include:  Cold compresses to soothe itching and swelling.  Eye drops.  Nasal sprays.  Using a saline  spray or container (neti pot) to flush out the nose (nasal irrigation). These methods can help clear away mucus and keep the nasal passages moist.  Using a humidifier.  Oral antihistamines or other medicines to block allergic reaction and inflammation.  Skin creams to treat rashes or itching.  Diet changes to eliminate food allergy  triggers.  Repeated exposure to tiny amounts of allergens to build up a tolerance and prevent future allergic reactions (immunotherapy). These include: ? Allergy shots. ? Oral treatment. This involves taking small doses of an allergen under the tongue (sublingual immunotherapy).  Emergency epinephrine injection (auto-injector) in case of an allergic emergency. This is a self-injectable, pre-measured medicine that must be given within the first few minutes of a serious allergic reaction. Follow these instructions at home:         Avoid known allergens whenever possible.  If you suffer from airborne allergens, wash out your nose daily. You can do this with a saline spray or a neti pot to flush out your nose (nasal irrigation).  Take over-the-counter and prescription medicines only as told by your health care provider.  Keep all follow-up visits as told by your health care provider. This is important.  If you are at risk of a severe allergic reaction (anaphylaxis), keep your auto-injector with you at all times.  If you have ever had anaphylaxis, wear a medical alert bracelet or necklace that states you have a severe allergy. Contact a health care provider if:  Your symptoms do not improve with treatment. Get help right away if:  You have symptoms of anaphylaxis, such as: ? Swollen mouth, tongue, or throat. ? Pain or tightness in your chest. ? Trouble breathing or shortness of breath. ? Dizziness or fainting. ? Severe abdominal pain, vomiting, or diarrhea. This information is not intended to replace advice given to you by your health care provider. Make sure you discuss any questions you have with your health care provider. Document Released: 09/12/2002 Document Revised: 09/12/2017 Document Reviewed: 01/05/2016 Elsevier Patient Education  [employer].   Peripheral Neuropathy Peripheral neuropathy is a type of nerve damage. It affects nerves that carry signals between the  spinal cord and the arms, legs, and the rest of the body (peripheral nerves). It does not affect nerves in the spinal cord or brain. In peripheral neuropathy, one nerve or a group of nerves may be damaged. Peripheral neuropathy is a broad category that includes many specific nerve disorders, like diabetic neuropathy, hereditary neuropathy, and carpal tunnel syndrome. What are the causes? This condition may be caused by:  Diabetes. This is the most common cause of peripheral neuropathy.  Nerve injury.  Pressure or stress on a nerve that lasts a long time.  Lack (deficiency) of B vitamins. This can result from alcoholism, poor diet, or a restricted diet.  Infections.  Autoimmune diseases, such as rheumatoid arthritis and systemic lupus erythematosus.  Nerve diseases that are passed from parent to child (inherited).  Some medicines, such as cancer medicines (chemotherapy).  Poisonous (toxic) substances, such as lead and mercury.  Too little blood flowing to the legs.  Kidney disease.  Thyroid disease. In some cases, the cause of this condition is not known. What are the signs or symptoms? Symptoms of this condition depend on which of your nerves is damaged. Common symptoms include:  Loss of feeling (numbness) in the feet, hands, or both.  Tingling in the feet, hands, or both.  Burning pain.  Very sensitive skin.  Weakness.  Not being able to move a part of the body (paralysis).  Muscle twitching.  Clumsiness or poor coordination.  Loss of balance.  Not being able to control your bladder.  Feeling dizzy.  Sexual problems. How is this diagnosed? Diagnosing and finding the cause of peripheral neuropathy can be difficult. Your health care provider will take your medical history and do a physical exam. A neurological exam will also be done. This involves checking things that are affected by your brain, spinal cord, and nerves (nervous system). For example, your health  care provider will check your reflexes, how you move, and what you can feel. You may have other tests, such as:  Blood tests.  Electromyogram (EMG) and nerve conduction tests. These tests check nerve function and how well the nerves are controlling the muscles.  Imaging tests, such as CT scans or MRI to rule out other causes of your symptoms.  Removing a small piece of nerve to be examined in a lab (nerve biopsy). This is rare.  Removing and examining a small amount of the fluid that surrounds the brain and spinal cord (lumbar puncture). This is rare. How is this treated? Treatment for this condition may involve:  Treating the underlying cause of the neuropathy, such as diabetes, kidney disease, or vitamin deficiencies.  Stopping medicines that can cause neuropathy, such as chemotherapy.  Medicine to relieve pain. Medicines may include: ? Prescription or over-the-counter pain medicine. ? Antiseizure medicine. ? Antidepressants. ? Pain-relieving patches that are applied to painful areas of skin.  Surgery to relieve pressure on a nerve or to destroy a nerve that is causing pain.  Physical therapy to help improve movement and balance.  Devices to help you move around (assistive devices). Follow these instructions at home: Medicines  Take over-the-counter and prescription medicines only as told by your health care provider. Do not take any other medicines without first asking your health care provider.  Do not drive or use heavy machinery while taking prescription pain medicine. Lifestyle   Do not use any products that contain nicotine or tobacco, such as cigarettes and e-cigarettes. Smoking keeps blood from reaching damaged nerves. If you need help quitting, ask your health care provider.  Avoid or limit alcohol. Too much alcohol can cause a vitamin B deficiency, and vitamin B is needed for healthy nerves.  Eat a healthy diet. This includes: ? Eating foods that are high in  fiber, such as fresh fruits and vegetables, whole grains, and beans. ? Limiting foods that are high in fat and processed sugars, such as fried or sweet foods. General instructions   If you have diabetes, work closely with your health care provider to keep your blood sugar under control.  If you have numbness in your feet: ? Check every day for signs of injury or infection. Watch for redness, warmth, and swelling. ? Wear padded socks and comfortable shoes. These help protect your feet.  Develop a good support system. Living with peripheral neuropathy can be stressful. Consider talking with a mental health specialist or joining a support group.  Use assistive devices and attend physical therapy as told by your health care provider. This may include using a walker or a cane.  Keep all follow-up visits as told by your health care provider. This is important. Contact a health care provider if:  You have new signs or symptoms of peripheral neuropathy.  You are struggling emotionally from dealing with peripheral neuropathy.  Your pain is not well-controlled. Get help right  away if:  You have an injury or infection that is not healing normally.  You develop new weakness in an arm or leg.  You fall frequently. Summary  Peripheral neuropathy is when the nerves in the arms, or legs are damaged, resulting in numbness, weakness, or pain.  There are many causes of peripheral neuropathy, including diabetes, pinched nerves, vitamin deficiencies, autoimmune disease, and hereditary conditions.  Diagnosing and finding the cause of peripheral neuropathy can be difficult. Your health care provider will take your medical history, do a physical exam, and do tests, including blood tests and nerve function tests.  Treatment involves treating the underlying cause of the neuropathy and taking medicines to help control pain. Physical therapy and assistive devices may also help. This information is not  intended to replace advice given to you by your health care provider. Make sure you discuss any questions you have with your health care provider. Document Released: 06/09/2002 Document Revised: 06/01/2017 Document Reviewed: 08/28/2016 Elsevier Patient Education  2020 [employer].

## 2019-02-25 MED ORDER — OMEPRAZOLE 20 MG PO CPDR: DELAYED_RELEASE_CAPSULE | ORAL | 1 refills | Status: DC

## 2019-02-25 NOTE — Telephone Encounter (Signed)
2nd request . Jessica Fleeger, CMA  

## 2019-02-27 LAB — MULTIPLE MYELOMA PANEL, SERUM
Albumin SerPl Elph-Mcnc: 3.6 g/dL (ref 2.9–4.4)
Albumin/Glob SerPl: 1.3 (ref 0.7–1.7)
Alpha 1: 0.2 g/dL (ref 0.0–0.4)
Alpha2 Glob SerPl Elph-Mcnc: 0.8 g/dL (ref 0.4–1.0)
B-Globulin SerPl Elph-Mcnc: 1 g/dL (ref 0.7–1.3)
Gamma Glob SerPl Elph-Mcnc: 1.1 g/dL (ref 0.4–1.8)
Globulin, Total: 3 g/dL (ref 2.2–3.9)
IgA/Immunoglobulin A, Serum: 257 mg/dL (ref 87–352)
IgG (Immunoglobin G), Serum: 1155 mg/dL (ref 586–1602)
IgM (Immunoglobulin M), Srm: 18 mg/dL — ABNORMAL LOW (ref 26–217)
Total Protein: 6.6 g/dL (ref 6.0–8.5)

## 2019-02-27 LAB — VITAMIN B1: Thiamine: 102.5 nmol/L (ref 66.5–200.0)

## 2019-02-27 LAB — B12 AND FOLATE PANEL
Folate: 5.3 ng/mL (ref >3.0)
Vitamin B-12: >2000 pg/mL — ABNORMAL HIGH (ref 232–1245)

## 2019-02-27 LAB — METHYLMALONIC ACID, SERUM: Methylmalonic Acid: 93 nmol/L (ref 0–378)

## 2019-02-27 LAB — TSH: TSH: 0.105 u[IU]/mL — ABNORMAL LOW (ref 0.450–4.500)

## 2019-02-27 LAB — VITAMIN B6: Vitamin B6: 17.9 ug/L (ref 2.0–32.8)

## 2019-03-07 ENCOUNTER — Ambulatory Visit (INDEPENDENT_AMBULATORY_CARE_PROVIDER_SITE_OTHER): Payer: Medicare Other | Admitting: Family Medicine

## 2019-03-07 ENCOUNTER — Other Ambulatory Visit: Payer: Self-pay

## 2019-03-07 ENCOUNTER — Encounter: Payer: Self-pay | Admitting: Family Medicine

## 2019-03-07 VITALS — BP 140/80 | HR 92

## 2019-03-07 DIAGNOSIS — R7989 Other specified abnormal findings of blood chemistry: Secondary | ICD-10-CM

## 2019-03-07 DIAGNOSIS — I10 Essential (primary) hypertension: Secondary | ICD-10-CM

## 2019-03-07 DIAGNOSIS — E7800 Pure hypercholesterolemia, unspecified: Secondary | ICD-10-CM

## 2019-03-07 DIAGNOSIS — G4733 Obstructive sleep apnea (adult) (pediatric): Secondary | ICD-10-CM | POA: Diagnosis not present

## 2019-03-07 DIAGNOSIS — L299 Pruritus, unspecified: Secondary | ICD-10-CM | POA: Diagnosis not present

## 2019-03-07 DIAGNOSIS — E0590 Thyrotoxicosis, unspecified without thyrotoxic crisis or storm: Secondary | ICD-10-CM | POA: Diagnosis not present

## 2019-03-07 DIAGNOSIS — Z23 Encounter for immunization: Secondary | ICD-10-CM

## 2019-03-07 MED ORDER — EZETIMIBE 10 MG PO TABS: 10.0000 mg | ORAL_TABLET | Freq: Every day | ORAL | 0 refills | Status: DC

## 2019-03-07 NOTE — Assessment & Plan Note (Addendum)
Patient reports having generalized pruritis for... -referral to allergist

## 2019-03-07 NOTE — Progress Notes (Signed)
Patient Name: Alicia [name] Date of Birth: 04/07/1956 Date of Visit: 03/07/19 PCP: Stark Klein, MD  Chief Complaint:   Subjective: Alicia [name] is a pleasant 63 y.o. with medical history significant for DM2, HTN, deafness, GERD, HLD and pSVT presenting today for follow up of lab results and to discuss low TSH levels as well as chronic pruritis.   Hyperthyroidism: Patient states that she learned her TSH was very low and was concerned if she has hyperthyroidism.  ROS positive for diarrhea, increased feelings of anxiety, increased HR, hair loss with thinning hair line,  sweating and temperature intolerance.   Sleep Apnea  Patient reports she was supposed to get the equipment for nightly CPAP but was unable to complete the process due to COVID and hasn't heard back from sleep medicine since. Patient reports being seen by Pulmonary at [facility].   Pruritis: Patient states she is still itching on her hands and feet that persists throughout the day and is worse when she's upset and during long drives. The itching starts at her feet and stops at her knee. Her hands also itch and the itching primarily alternates between her hands and feet. She reports taking increased doses of Gabapentin. She states that she would like to not depend on this medication because she was worried about being addicted to the medication. Patient states the itching does not keep her awake at night but does seem to itch right when she gets in bed but does not wake her up. Denies burning and has "deep inside" itching and feels like 'ants are inside" of her skin. This had been going on for many years and continues to worsen. Only alleviating factor is to take gabapentin or to scratch. Denies family history.   DM2: Hemoglobin A1c in target at 7.   HTN:  Checks at home and usually 160s-170s. Had one episode with headache where she needed to sit down and rest.   Lab Results: Chol 222- patient tried on multiple lipid lowering  statins with intolerance. Patient agreeable to starting Zetia.   LDL 122   TSH 0.1 (low)- will repeat and consider referral to endocrinologist   B12 >2000 high - patient encouraged to stop taking vitamin B12   ROS: Per HPI.   I have reviewed the patient's medical, surgical, family, and social history as appropriate.  Vitals:   03/07/19 0851 03/07/19 1001  BP: (!) 178/88 140/80  Pulse: 92   SpO2: 98%      General: Alert and cooperative and appears to be in no acute distress HEENT: Neck non-tender without lymphadenopathy or masses, Exam notable for thyromegaly, nontender thyroid.  Cardio: Normal S1 and S2, no S3 or S4. Rhythm is regular. No murmurs or rubs.   Pulm: Clear to auscultation bilaterally, no crackles, wheezing, or diminished breath sounds. Normal respiratory effort Abdomen: Bowel sounds normal. Abdomen soft and non-tender.  Extremities: No peripheral edema. Warm/ well perfused.  Strong radial and pedal pulses. Neuro: Cranial nerves grossly intact  Assessment and Plan:   Pruritus Patient reports having generalized pruritis for... -referral to allergist   HYPERCHOLESTEROLEMIA Last Chol elevated at 222 and LDL elevated at 122  Patient does not tolerate statin therapy after being tried on two medications. Discussed starting Zetia 10mg  daily, patient agrees with this plan  -start Zetia 10mg  daily   Hypertension Patient with elevated BP 178/88. Repeat BP 140/88.  Counseled patient to continue measuring her BP at home and notify the office if it remains elevated [id number] systolic  or if it drops to [id number] systolic.  -encourage patient to measure BP at home  -will increase hydrochlorothiazide from 12.5 to 25mg  if BP remains elevated    Low serum thyroid stimulating hormone (TSH) Patient with suppressed TSH 0.105 and is experiencing increased feelings of anxiety, sweating, diarrhea, increased HR.  -repeat TSH, collect T3, and free T4  -if remains abnormal, will refer to  endocrinology for med management  OSA (obstructive sleep apnea) Patient continues to have sleepiness throughout the day and states that she is supposed to be using nightly CPAP but had a disconnect with pulmonary sleep medicine and getting her equipment due to COVID-19 restrictions. Patient encouraged to reach out to that office in order to arrange for equipment delivery.   Hearing loss Interpreter used during today's visit.   Hyperthyroidism Patient with concern for hyperthyroidism  -repeat TSH, T4 and T3  -will refer to endocrinology if labs remain abnormal    Return to care in 1 month.   Stark Klein, MD  Family Medicine  PGY-1

## 2019-03-07 NOTE — Patient Instructions (Addendum)
Thank you for allowing me to take part in your care today!   We discussed your lab results which were notable for elevated cholesterol of 222 and elevated LDL (bad cholesterol) of 122. We will plan to manage this with diet and exercise given your intolerance of statin medications that are usually used to help lower cholesterol levels.   We also discussed:  1. High Cholesterol   Please start taking Zetia as prescribed. We will repeat your lipid panel in 1 month.  2. Low TSH, concerning for hyperthyroidism  We will repeat your lab work as well as measure your T3 and T4 in order to determine the specific cause of hyperthyroidism and confirm whether or not you truly have hyperthyroidism.   If your levels remain abnormal, we will plan to refer you to an endocrinologist for further management.   3. HTN   Your BP was elevated at 178/88 and on repeat was 140/88.   Please continue checking your BP at home and record your readings to bring to your next appointment. Please continue taking  your blood pressure medications as prescribed.  If if remains elevated above for the top number, please call to notify our office and we will adjust your medication dosages.    Please return to care in 4 weeks in order to follow up.

## 2019-03-07 NOTE — Assessment & Plan Note (Signed)
Patient continues to have sleepiness throughout the day and states that she is supposed to be using nightly CPAP but had a disconnect with pulmonary sleep medicine and getting her equipment due to COVID-19 restrictions. Patient encouraged to reach out to that office in order to arrange for equipment delivery.

## 2019-03-07 NOTE — Assessment & Plan Note (Deleted)
Patient states that... -discontinue omeprazole and start famotidine?

## 2019-03-07 NOTE — Assessment & Plan Note (Signed)
Interpreter used during today's visit.

## 2019-03-07 NOTE — Assessment & Plan Note (Addendum)
Last Chol elevated at 222 and LDL elevated at 122  Patient does not tolerate statin therapy after being tried on two medications. Discussed starting Zetia 10mg  daily, patient agrees with this plan  -start Zetia 10mg  daily

## 2019-03-07 NOTE — Assessment & Plan Note (Addendum)
Patient with suppressed TSH 0.105 and is experiencing increased feelings of anxiety, sweating, diarrhea, increased HR.  -repeat TSH, collect T3, and free T4  -if remains abnormal, will refer to endocrinology for med management

## 2019-03-07 NOTE — Assessment & Plan Note (Signed)
Patient with concern for hyperthyroidism  -repeat TSH, T4 and T3  -will refer to endocrinology if labs remain abnormal

## 2019-03-07 NOTE — Assessment & Plan Note (Addendum)
Patient with elevated BP 178/88. Repeat BP 140/88.  Counseled patient to continue measuring her BP at home and notify the office if it remains elevated [id number] systolic or if it drops to [id number] systolic.  -encourage patient to measure BP at home  -will increase hydrochlorothiazide from 12.5 to 25mg  if BP remains elevated

## 2019-03-08 LAB — T4, FREE: Free T4: 1.02 ng/dL (ref 0.82–1.77)

## 2019-03-08 LAB — TSH: TSH: 1.64 u[IU]/mL (ref 0.450–4.500)

## 2019-03-08 LAB — T3, FREE: T3, Free: 1.9 pg/mL — ABNORMAL LOW (ref 2.0–4.4)

## 2019-03-11 ENCOUNTER — Other Ambulatory Visit: Payer: Self-pay | Admitting: Physician Assistant

## 2019-03-14 ENCOUNTER — Ambulatory Visit: Payer: Medicare Other

## 2019-03-19 ENCOUNTER — Telehealth: Payer: Self-pay | Admitting: Physician Assistant

## 2019-03-19 MED ORDER — CARVEDILOL 25 MG PO TABS: ORAL_TABLET | ORAL | 0 refills | Status: DC

## 2019-03-19 NOTE — Telephone Encounter (Signed)
Called and left message informing pt that we are unable to send in a 90 day supply of carvedilol because pt is overdue for a yearly appt. I asked pt to call back to schedule an appt with Dr. Saunders Revel or Richardson Dopp, PA, before a 90 day supply can be sent to her pharmacy.

## 2019-03-19 NOTE — Telephone Encounter (Signed)
New Message      *STAT* If patient is at the pharmacy, call can be transferred to refill team.   1. Which medications need to be refilled? (please list name of each medication and dose if known) Carvedilol 25mg  1 tablet 2 times daily  2. Which pharmacy/location (including street and city if local pharmacy) is medication to be sent to? [facility] on [address]  3. Do they need a 30 day or 90 day supply? 90 day supply

## 2019-03-21 ENCOUNTER — Other Ambulatory Visit: Payer: Self-pay | Admitting: Family Medicine

## 2019-03-21 DIAGNOSIS — M159 Polyosteoarthritis, unspecified: Secondary | ICD-10-CM

## 2019-03-26 ENCOUNTER — Other Ambulatory Visit: Payer: Self-pay | Admitting: Physician Assistant

## 2019-04-02 ENCOUNTER — Telehealth (INDEPENDENT_AMBULATORY_CARE_PROVIDER_SITE_OTHER): Payer: Medicare Other | Admitting: Family Medicine

## 2019-04-02 ENCOUNTER — Other Ambulatory Visit: Payer: Self-pay

## 2019-04-02 DIAGNOSIS — T466X5A Adverse effect of antihyperlipidemic and antiarteriosclerotic drugs, initial encounter: Secondary | ICD-10-CM

## 2019-04-02 DIAGNOSIS — R6889 Other general symptoms and signs: Secondary | ICD-10-CM

## 2019-04-02 DIAGNOSIS — M25569 Pain in unspecified knee: Secondary | ICD-10-CM

## 2019-04-02 DIAGNOSIS — Z789 Other specified health status: Secondary | ICD-10-CM

## 2019-04-02 NOTE — Assessment & Plan Note (Addendum)
History of intolerance with lipitor 40mg  and crestor 5mg , 10mg . Now with intolerance to ezetimibe 10mg . Last lipid panel and A1c was 01/07/2019. HDL 91, LDL 122, A1c 7.0. Has protections with the good HDL score but her diabetes warrants the use of a statin. -Stop Ezetimibe -Follow up in 2 weeks for lipid panel and A1c -Consider close monitoring or another antihyperlipidemic class at next visit

## 2019-04-02 NOTE — Progress Notes (Signed)
[facility] Visit  Patient consented to have virtual visit. Method of visit: Video was attempted, but technology challenges prevented patient from using video, so visit was conducted via telephone.   Use of sign language inter  Encounter participants: Patient: Alicia [name] - located at home Provider: Gerlene Fee - located at home office Others (if applicable): Use of sign language interpreter  Chief Complaint: Side effects from ezetimibe  HPI: Ms. Palleschi started experiencing what she says as weird symptoms 1 week ago and attributes it to taking her new medication ezetimibe. 3 days ago her knees on the lateral side became painful. She says she felt like something was wrong.  She also says the outside of her abdomen did not feel well, like there were bugs crawling on her and was feeling electric shocks. She checked on the computer and saw these could be side effect to her new medication. She says all of the symptoms started when she stared the drug and has since stopped since stopping it 2 days ago. She says that she could not sleep but is now sleeping well.  She has taken lipitor 40mg  and crestor 5mg , and 10mg  in the past all with muscles aches and knee discomfort, as well as odd skin sensations.  She denies any other concerns today and says she feel well after stopping the medication.  ROS: per HPI  Pertinent PMHx:  Patient Active Problem List   Diagnosis Date Noted  . Pruritus 03/07/2019  . Hyperthyroidism 03/07/2019  . Hypertension 01/07/2019  . Hypercalcemia 10/08/2018  . Seasonal allergies 08/27/2018  . Osteoarthritis involving multiple joints on both sides of body 05/25/2018  . Statin intolerance 12/26/2017  . PSVT (paroxysmal supraventricular tachycardia) ([place]) 11/09/2017  . Hyperlipidemia 08/30/2017  . OSA (obstructive sleep apnea) 08/31/2016  . Diabetes ([place]) 09/14/2015  . Seizure ([place]) 06/08/2014  . Iron deficiency anemia  07/01/2012  . Diabetic retinopathy ([place]) 07/21/2011  . GERD 12/30/2007  . Diabetic polyneuropathy ([place]) 08/30/2006  . Type 2 diabetes mellitus with ophthalmic complication ([place]) [id number]  . HYPERCHOLESTEROLEMIA 08/30/2006  . Hearing loss 08/30/2006    Exam:  Not able to assess due to nature of visit and use of interpreter  Assessment/Plan:  Statin intolerance History of intolerance with lipitor 40mg  and crestor 5mg , 10mg . Now with intolerance to ezetimibe 10mg . Last lipid panel and A1c was 01/07/2019. HDL 91, LDL 122, A1c 7.0. Has protections with the good HDL score but her diabetes warrants the use of a statin. -Stop Ezetimibe -Follow up in 2 weeks for lipid panel and A1c -Consider close monitoring or another antihyperlipidemic class at next visit    Time spent during visit with patient: 20 minutes  Gerlene Fee, [place] PGY-1

## 2019-04-03 NOTE — Addendum Note (Signed)
Addended by: Caralee Ates on: 04/03/2019 05:07 PM   Modules accepted: Level of Service

## 2019-04-04 DIAGNOSIS — Z20828 Contact with and (suspected) exposure to other viral communicable diseases: Secondary | ICD-10-CM | POA: Diagnosis not present

## 2019-04-07 DIAGNOSIS — E113512 Type 2 diabetes mellitus with proliferative diabetic retinopathy with macular edema, left eye: Secondary | ICD-10-CM | POA: Diagnosis not present

## 2019-04-07 DIAGNOSIS — E113592 Type 2 diabetes mellitus with proliferative diabetic retinopathy without macular edema, left eye: Secondary | ICD-10-CM | POA: Diagnosis not present

## 2019-04-07 DIAGNOSIS — H35373 Puckering of macula, bilateral: Secondary | ICD-10-CM | POA: Diagnosis not present

## 2019-04-15 NOTE — Progress Notes (Signed)
Cardiology Office Note:    Date:  04/16/2019   ID:  Alicia [name], DOB 24-Aug-1955, MRN [id number]  PCP:  Stark Klein, MD  Cardiologist:  Nelva Bush, MD  Electrophysiologist:  None   Referring MD: Stark Klein, MD   Chief Complaint: follow-up of paroxysmal SVT and atypical chest pain  History of Present Illness:    Alicia [name] is a 63 y.o. female with a history of atypical stress test with negative nuclear stress test in 08/2017, paroxysmal SVT noted on 08/2017, hypertension, hyperlipidemia intolerant to statins, type 2 diabetes mellitus, sleep apnea, and deafness who has been followed by Dr. Saunders Revel and presents today for follow-up.   Patient first seen by Dr. Saunders Revel in 08/2017 for evaluation of atypical chest pain that she described as a cramp like pain that would last for a few seconds and then resolve spontaneously. She also noted palpitations with associated shortness of breath. Lexiscan Myoview and Event Monitor were ordered for further evaluation. Myoview showed no evidence of ischemia and Monitor showed mostly sinus rhythm with two episodes of narrow complex tachycardia suggestive of SVT. Coreg was increased at that time. Patient was last seen by Richardson Dopp, PA-C, in 10/2017 for follow-up. She denied any recurrent chest pain at that time. She continued to report a few episodes of palpitations both related to meals. Patient was started on Pepcid. EP referral was recommended for possible ablation if palpitations continued.  Patient recently seen by PCP (Dr. Rosita Fire) on 03/07/2019. Patient was concerned that she may have hyperthyroidism at that time due to a recent low TSH with symptoms of increased anxiety, sweating, diarrhea, and increased heart rate. Repeat thyroid panel was ordered at that time which showed TSH 1.640 (WNL), free T4 1.02 (WNL), and free T3 1.9 (slightly low). Patient was also started on Zetia at that time due to elevated LDL of 122 in 01/2019. However, she was not  able to tolerate this either and it has since been discontinued.   Patient presents today for follow-up. She continues to have episodes of heart racing/pounding that occur randomly. She does not think that the Pepcid helped much. Episodes are sometimes triggered by low blood sugar, high blood sugar, after meals, if she is anxious, or after climbing several flight of steps. However, palpitations also often occur when she is just laying down at night. She denies any associated symptoms with this including chest pain, shortness of breath, lightheadedness, or dizziness. Episodes sometimes last for a couple of minutes but can last for greater than 15 minutes as well. PCP is continuing work-up for hyperthyroidism and she has plans to follow-up with them tomorrow. Patient also states that she has known sleep apnea but has not been able to get a CPAP machine during the pandemic. Her daughter tells her that she snores very loudly at night. She breathes better at night if she sleep on her side rather than her back. No PND or lower extremity edema. Patient denies any recurrent atypical chest pain. Patient reports significant diffuse upper abdominal pain after starting Zetia but states this has improved since she stopped this. She reports some nausea but states this is not new and for which she takes medicine for. She also reports occasional headache when her sugar is low.  Past Medical History:  Diagnosis Date   Anemia    ARTHRITIS, KNEE 09/17/2007   Asthma 04/04/2010   Cataract 01/07/2019   Closed fracture of distal end of left radius 11/01/2015   Complete deafness  Meningitis at age 68   Deaf    Diabetes mellitus    Diabetic neuropathy ([place])    Ganglion cyst 06/22/2011   Gastroparesis    GERD (gastroesophageal reflux disease)    H/O: C-section    Hyperlipidemia    Hypertension    Neuromuscular disorder (HCC)    diabetic neuropathy   PSVT (paroxysmal supraventricular tachycardia) ([place])  11/09/2017   Event monitor 09/14/2017 - Predominantly sinus rhythm with episodes of narrow-complex tachycardia suggestive of paroxysmal supraventricular tachycardia.   S/P appy     Past Surgical History:  Procedure Laterality Date   APPENDECTOMY     cardiolyte EF 77%, no ischemia in 2006  2006   [place] Right 04/20/2015   Procedure: RIGHT CARPAL TUNNEL RELEASE;  Surgeon: Daryll Brod, MD;  Location: [place];  Service: Orthopedics;  Laterality: Right;   CARPAL TUNNEL RELEASE Left 11/04/2015   Procedure: CARPAL TUNNEL RELEASE;  Surgeon: Daryll Brod, MD;  Location: [place];  Service: Orthopedics;  Laterality: Left;   CESAREAN SECTION     OPEN REDUCTION INTERNAL FIXATION (ORIF) DISTAL RADIAL FRACTURE Left 11/04/2015   Procedure: OPEN REDUCTION INTERNAL FIXATION (ORIF) LEFT DISTAL RADIAL FRACTURE POSSIBLE BONE GRAFT;  Surgeon: Daryll Brod, MD;  Location: [place];  Service: Orthopedics;  Laterality: Left;   TRIGGER FINGER RELEASE Right 04/20/2015   Procedure: RELEASE TRIGGER FINGER/A-1 PULLEY RIGHT MIDDLE FINGER,RIGHT RING FINGER;  Surgeon: Daryll Brod, MD;  Location: [place];  Service: Orthopedics;  Laterality: Right;   TUBAL LIGATION     ULNAR NERVE TRANSPOSITION Right 04/20/2015   Procedure: RIGHT ULNAR NERVE DECOMPRESSION;  Surgeon: Daryll Brod, MD;  Location: [place];  Service: Orthopedics;  Laterality: Right;   UTERINE FIBROID EMBOLIZATION  2007   WRIST SURGERY     Cyst removed on left   WRIST SURGERY Right 1985   tendon repair R wrist    Current Medications: Current Meds  Medication Sig   ACCU-CHEK SOFTCLIX LANCETS lancets TEST 4 TIMES A DAY   acetaminophen (TYLENOL) 500 MG tablet Take 500 mg by mouth every 4 (four) hours as needed.   aspirin (BAYER CHILDRENS ASPIRIN) 81 MG chewable tablet Chew 81 mg by mouth daily.     carvedilol (COREG) 25 MG tablet TAKE 1 TABLET (25 MG  TOTAL) BY MOUTH 2 (TWO) TIMES DAILY WITH A MEAL. PLEASE MAKE OVERDUE APPT   cholecalciferol (VITAMIN D) 1000 UNITS tablet Take 1,000 Units by mouth daily.   ferrous sulfate 325 (65 FE) MG tablet Take 325 mg by mouth daily with breakfast.   fluticasone (FLONASE) 50 MCG/ACT nasal spray SPRAY 2 SPRAYS INTO EACH NOSTRIL EVERY DAY   gabapentin (NEURONTIN) 400 MG capsule TAKE 3 CAPSULES BY MOUTH TWICE A DAY AS NEEDED FOR PAIN   glucose blood test strip CHECK SUGAR 3 TIMES A DAILY.   hydrochlorothiazide (HYDRODIURIL) 12.5 MG tablet TAKE 1 TABLET BY MOUTH EVERY DAY   Insulin Glargine (BASAGLAR KWIKPEN) 100 UNIT/ML SOPN INJECT 8 UNITS INTO THE SKIN DAILY.   Insulin Pen Needle 31G X 5 MM MISC 1 Container by Does not apply route once as needed for up to 1 dose.   liraglutide (VICTOZA) 18 MG/3ML SOPN Inject 0.1 mLs (0.6 mg total) into the skin 1 day or 1 dose for 120 doses.   losartan (COZAAR) 100 MG tablet TAKE 1 TABLET BY MOUTH EVERY DAY   meloxicam (MOBIC) 15 MG tablet TAKE 1 TABLET BY MOUTH EVERY  DAY   metFORMIN (GLUCOPHAGE) 1000 MG tablet TAKE 1 TABLET BY MOUTH TWICE A DAY   NOVOLOG 100 UNIT/ML injection INJECT 1-4 UNITS INTO THE SKIN 3 TIMES DAILY WITH MEALS.   Olopatadine HCl 0.2 % SOLN PLACE 1 DROP IN BOTH EYES DAILY   omeprazole (PRILOSEC) 20 MG capsule TAKE 1 CAPSULE BY MOUTH EVERY DAY   senna-docusate (SENOKOT-S) 8.6-50 MG tablet Take 1 tablet by mouth daily as needed for mild constipation or moderate constipation.   [DISCONTINUED] carvedilol (COREG) 25 MG tablet TAKE 1 TABLET (25 MG TOTAL) BY MOUTH 2 (TWO) TIMES DAILY WITH A MEAL. PLEASE MAKE OVERDUE APPT   Current Facility-Administered Medications for the 04/16/19 encounter (Office Visit) with Darreld Mclean, PA-C  Medication   fexofenadine (ALLEGRA) tablet 60 mg     Allergies:   Sulfonamide derivatives, Lipitor [atorvastatin calcium], Ramipril, Trulicity [dulaglutide], Atorvastatin, and Sulfamethoxazole   Social  History   Socioeconomic History   Marital status: Divorced    Spouse name: Not on file   Number of children: 3   Years of education: 12   Highest education level: Not on file  Occupational History   Occupation: daycare    Employer: [employer]  Social Needs   Financial resource strain: Not on file   Food insecurity    Worry: Not on file    Inability: Not on file   Transportation needs    Medical: Not on file    Non-medical: Not on file  Tobacco Use   Smoking status: Never Smoker   Smokeless tobacco: Never Used  Substance and Sexual Activity   Alcohol use: Yes    Alcohol/week: 3.0 standard drinks    Types: 3 Glasses of wine per week    Comment: 1-2 wine occasional    Drug use: Never   Sexual activity: Yes    Birth control/protection: Surgical  Lifestyle   Physical activity    Days per week: Not on file    Minutes per session: Not on file   Stress: Not on file  Relationships   Social connections    Talks on phone: Not on file    Gets together: Not on file    Attends religious service: Not on file    Active member of club or organization: Not on file    Attends meetings of clubs or organizations: Not on file    Relationship status: Not on file  Other Topics Concern   Not on file  Social History Narrative   Lives at home with daughter   Right handed   Caffeine: often     Family History: The patient's family history includes Cancer in her maternal aunt and maternal grandmother; Diabetes in her father; Heart attack (age of onset: 51) in her mother; Hypertension in her mother. There is no history of Neuropathy.  ROS:   Please see the history of present illness.    All other systems reviewed and are negative.  EKGs/Labs/Other Studies Reviewed:    The following studies were reviewed today:  Lexiscan Myoview 09/14/2017:  Nuclear stress EF: 61%.  The study is normal.  The left ventricular ejection fraction is normal (55-65%).   1.  EF 61%, normal wall motion.  2. No evidence for ischemia or infarction by perfusion images.    Normal study.  _______________  Cardiac Monitor  09/14/2017 to 09/27/2017:  The patient was monitored for 14 days.  The predominant rhythm was sinus; average heart rate during triggered events was 94 bpm.  Two episodes of narrow complex tachycardia were observed, suggestive of SVT.  No ventricular arrhythmias or prolonged pauses were seen.  Patient triggered events corresponding to rapid heart rate/palpitations/flutters correspond to sinus rhythm and narrow-complex tachycardia.   Predominantly sinus rhythm with episodes of narrow-complex tachycardia suggestive of paroxysmal supraventricular tachycardia.  EKG:  EKG ordered today. EKG personally reviewed and demonstrates normal sinus rhythm, rate 79 bpm, with possible left atrial enlargement and non-specific ST/T changes. There is some underlying artifact but it does not look like there are any significant changes compared to prior tracings.    Recent Labs: 01/07/2019: BUN 15; Creatinine, Ser 0.72; Hemoglobin 11.4; Platelets 220; Potassium 4.2; Sodium 140 03/07/2019: TSH 1.640  Recent Lipid Panel    Component Value Date/Time   CHOL 222 (H) 01/07/2019 1021   TRIG 44 01/07/2019 1021   HDL 91 01/07/2019 1021   CHOLHDL 2.4 01/07/2019 1021   CHOLHDL 2.3 07/28/2016 0936   VLDL 12 07/28/2016 0936   LDLCALC 122 (H) 01/07/2019 1021   LDLDIRECT 78 07/10/2013 1441    Physical Exam:    Vital Signs: BP 120/74    Pulse 81    Ht 5\' 3"  (1.6 m)    Wt 156 lb 3.2 oz (70.9 kg)    SpO2 97%    BMI 27.67 kg/m     Wt Readings from Last 3 Encounters:  04/16/19 156 lb 3.2 oz (70.9 kg)  02/24/19 160 lb (72.6 kg)  10/08/18 158 lb 6 oz (71.8 kg)     General: 63 y.o. African-American female in no acute distress. HEENT: Normocephalic and atraumatic. Sclera clear.  Neck: Supple. No carotid bruits. No JVD. Heart: RRR. Distinct S1 and S2. No murmurs, gallops,  or rubs. Radial pulses 2+ and equal bilaterally. Lungs: No increased work of breathing. Clear to ausculation bilaterally. No wheezes, rhonchi, or rales.  Abdomen: Soft, non-distended, and non-tender to palpation. Bowel sounds present. MSK: Normal strength and tone for age. Extremities: No lower extremity edema.    Skin: Warm and dry. Neuro: Alert and oriented x3. No focal deficits. Psych: Normal affect. Responds appropriately.  Assessment:    1. PSVT (paroxysmal supraventricular tachycardia) (HCC)   2. Palpitations   3. Atypical chest pain   4. Essential hypertension   5. Hyperlipidemia, unspecified hyperlipidemia type   6. Type 2 diabetes mellitus without complication, with long-term current use of insulin ([place])   7. Possible hyperthyroidism   8. OSA (obstructive sleep apnea)     Plan:    Paroxysmal SVT/ Palpitations  - Noted to have two episodes of narrow complex tachycardia suggestive of SVT on Event Monitor in 08/2017. - Patient continues to have frequent episodes of palpitations.  - Continue Coreg 25mg  twice daily. Will also prescribe Cardizem 30mg  to take as needed for palpitations that do not improve with Valsalva and last greater than 15 minutes.  - Patient is being worked up for possible hyperthyroidism which could certainly be triggering palpitations. But will go ahead and refer patient to EP given documented episodes of SVT for possible consideration of ablation.  Atypical Chest Pain - History of atypical chest pian with normal nuclear stress test in 08/2017.  - Stable. No recurrent chest pain.  - Continue aspirin and beta-blocker.  Hypertension - BP well controlled today at 120/74. - Continue Losartan 100mg  daily, Coreg 25mg  twice daily, and HCTZ 12.5mg  daily. - BMET from 01/2019 showed stable Cr of 0.72 and K of 4.5.   Hyperlipidemia - Most recent lipid panel from 01/2019:  Total Cholesterol 222, Triglycerides 44, HDL 91, LDL 122. - Intolerant to multiple statins in  the past. Recently tried Zetia but was also intolerant to that.  - Followed closely by PCP who is planning on rechecking lipid panel within the next week. Per PCP's last note on 04/02/2019, they may attempt another antihyperlipidemic class at next visit. Will defer to PCP for now but did notify patient that we could refer her to our lipid clinic in the future.   Type 2 Diabetes Mellitus - Hemoglobin A1c 70% in 01/2019. - Managed by PCP.  Possible Hyperthyroidism - Patient is concerned that she may have hyperthyroidism. She has multiple symptoms consistent with hyperthyroidism including anxiety, sweating, diarrhea, increased heart rate, fatigue, and hair thinning. Patient had low TSH of 0.105 in 02/2019. However, repeat labs in 03/2019 showed  TSH 1.640 (WNL), free T4 1.02 (WNL), and free T3 1.9 (slightly low).  - Patient reports she is scheduled to see PCP tomorrow to discuss possible further work-up.  Sleep Apnea - Patient states she has been unable to get CPAP machine during pandemic and has not heard back from sleep medicine at [place].   - Recommended calling and checking on this.   Disposition: Will refer to EP. Can follow-up with general Cardiology in 1 year.    Medication Adjustments/Labs and Tests Ordered: Current medicines are reviewed at length with the patient today.  Concerns regarding medicines are outlined above.  Orders Placed This Encounter  Procedures   Ambulatory referral to Cardiac Electrophysiology   EKG 12-Lead   Meds ordered this encounter  Medications   carvedilol (COREG) 25 MG tablet    Sig: TAKE 1 TABLET (25 MG TOTAL) BY MOUTH 2 (TWO) TIMES DAILY WITH A MEAL. PLEASE MAKE OVERDUE APPT    Dispense:  60 tablet    Refill:  0    Please make sure to keep your upcoming appointment for any future refills. Thank you   diltiazem (CARDIZEM) 30 MG tablet    Sig: Take 1 tablet (30 mg total) by mouth as needed (Heart racing or palpitations that last longer than  15 mins).    Dispense:  30 tablet    Refill:  2    Patient Instructions  Medication Instructions:  Your physician has recommended you make the following change in your medication:   1. START CARDIZEM 30 MG AS NEEDED FOR HEART RACING AND PALPITATIONS THAT LASTS LONGER THAN 15 MINUTES.   Refills have been sent in to your pharmacy for your Cardiac medications.   If you need a refill on your cardiac medications before your next appointment, please call your pharmacy.   Lab work: NONE If you have labs (blood work) drawn today and your tests are completely normal, you will receive your results only by:  [place] (if you have MyChart) OR  A paper copy in the mail If you have any lab test that is abnormal or we need to change your treatment, we will call you to review the results.  Testing/Procedures: NONE  Follow-Up: At [facility], you and your health needs are our priority.  As part of our continuing mission to provide you with exceptional heart care, we have created designated Provider Care Teams.  These Care Teams include your primary Cardiologist (physician) and Advanced Practice Providers (APPs -  Physician Assistants and Nurse Practitioners) who all work together to provide you with the care you need, when you need it. You will need a follow up appointment in:  12 months.  Please call our office 2 months in advance to schedule this appointment.  You may see Richardson Dopp, PA-C Vin Bhagat, PA-C  Daune Perch, NP  YOU HAVE BEEN REFERRED TO SEE AN ELECTROPHYSIOLOGIST.  Any Other Special Instructions Will Be Listed Below (If Applicable).       Signed, Darreld Mclean, PA-C  04/16/2019 5:07 PM    [facility]

## 2019-04-16 ENCOUNTER — Other Ambulatory Visit: Payer: Self-pay

## 2019-04-16 ENCOUNTER — Encounter: Payer: Self-pay | Admitting: Student

## 2019-04-16 ENCOUNTER — Ambulatory Visit (INDEPENDENT_AMBULATORY_CARE_PROVIDER_SITE_OTHER): Payer: Medicare Other | Admitting: Student

## 2019-04-16 VITALS — BP 120/74 | HR 81 | Ht 63.0 in | Wt 156.2 lb

## 2019-04-16 DIAGNOSIS — E785 Hyperlipidemia, unspecified: Secondary | ICD-10-CM | POA: Diagnosis not present

## 2019-04-16 DIAGNOSIS — E119 Type 2 diabetes mellitus without complications: Secondary | ICD-10-CM

## 2019-04-16 DIAGNOSIS — E0590 Thyrotoxicosis, unspecified without thyrotoxic crisis or storm: Secondary | ICD-10-CM

## 2019-04-16 DIAGNOSIS — R0789 Other chest pain: Secondary | ICD-10-CM | POA: Diagnosis not present

## 2019-04-16 DIAGNOSIS — I10 Essential (primary) hypertension: Secondary | ICD-10-CM

## 2019-04-16 DIAGNOSIS — G4733 Obstructive sleep apnea (adult) (pediatric): Secondary | ICD-10-CM | POA: Diagnosis not present

## 2019-04-16 DIAGNOSIS — Z794 Long term (current) use of insulin: Secondary | ICD-10-CM | POA: Diagnosis not present

## 2019-04-16 DIAGNOSIS — R002 Palpitations: Secondary | ICD-10-CM | POA: Diagnosis not present

## 2019-04-16 DIAGNOSIS — I471 Supraventricular tachycardia: Secondary | ICD-10-CM

## 2019-04-16 MED ORDER — DILTIAZEM HCL 30 MG PO TABS: 30.0000 mg | ORAL_TABLET | ORAL | 2 refills | Status: DC | PRN

## 2019-04-16 MED ORDER — CARVEDILOL 25 MG PO TABS: ORAL_TABLET | ORAL | 0 refills | Status: DC

## 2019-04-16 NOTE — Patient Instructions (Addendum)
Medication Instructions:  Your physician has recommended you make the following change in your medication:   1. START CARDIZEM 30 MG AS NEEDED FOR HEART RACING AND PALPITATIONS THAT LASTS LONGER THAN 15 MINUTES.   Refills have been sent in to your pharmacy for your Cardiac medications.   If you need a refill on your cardiac medications before your next appointment, please call your pharmacy.   Lab work: NONE If you have labs (blood work) drawn today and your tests are completely normal, you will receive your results only by: [facility] MyChart Message (if you have MyChart) OR . A paper copy in the mail If you have any lab test that is abnormal or we need to change your treatment, we will call you to review the results.  Testing/Procedures: NONE  Follow-Up: At [facility], you and your health needs are our priority.  As part of our continuing mission to provide you with exceptional heart care, we have created designated Provider Care Teams.  These Care Teams include your primary Cardiologist (physician) and Advanced Practice Providers (APPs -  Physician Assistants and Nurse Practitioners) who all work together to provide you with the care you need, when you need it. You will need a follow up appointment in:  12 months.  Please call our office 2 months in advance to schedule this appointment.  You may see Richardson Dopp, PA-C Vin Bhagat, PA-C . Daune Perch, NP  YOU HAVE BEEN REFERRED TO SEE AN ELECTROPHYSIOLOGIST.  Any Other Special Instructions Will Be Listed Below (If Applicable).

## 2019-04-17 ENCOUNTER — Ambulatory Visit (INDEPENDENT_AMBULATORY_CARE_PROVIDER_SITE_OTHER): Payer: Medicare Other | Admitting: Family Medicine

## 2019-04-17 ENCOUNTER — Encounter: Payer: Self-pay | Admitting: Family Medicine

## 2019-04-17 VITALS — BP 130/62 | HR 78 | Ht 63.0 in | Wt 154.2 lb

## 2019-04-17 DIAGNOSIS — E11311 Type 2 diabetes mellitus with unspecified diabetic retinopathy with macular edema: Secondary | ICD-10-CM

## 2019-04-17 DIAGNOSIS — E7800 Pure hypercholesterolemia, unspecified: Secondary | ICD-10-CM | POA: Diagnosis not present

## 2019-04-17 DIAGNOSIS — R197 Diarrhea, unspecified: Secondary | ICD-10-CM | POA: Diagnosis not present

## 2019-04-17 LAB — POCT GLYCOSYLATED HEMOGLOBIN (HGB A1C): HbA1c, POC (controlled diabetic range): 6.9 % (ref 0.0–7.0)

## 2019-04-17 NOTE — Patient Instructions (Addendum)
It was a pleasure to see you today! Thank you for choosing [facility] for your primary care. Alicia [name] was seen for .   Our plans for today were:  Increasing fiber to help combat your loose stools. Please increase fiber intake slowly in order to help bulk stools and prevent onset of constipation.   I would also suggest trying to identify potential food triggers that you may not be aware of that could be triggering your loose stools.   Please make sure to stay hydrated in order to prevent dehydration in the setting of having these loose stools.    I will recommend that you speak to your cardiologist about referral to lipid clinic in order to try and qualify for a different cholesterol medicine that will not cause the same side effects you are experiencing with your stomach.     To keep you healthy, we need to monitor some screening tests.  You should return to our clinic to see Dr. Rosita Fire as needed.   Best,  Dr. Rosita Fire

## 2019-04-17 NOTE — Progress Notes (Signed)
Patient Name: SELDA VISTA Date of Birth: [birth year] Date of Visit: 04/20/19 PCP: Stark Klein, MD  Chief Complaint: follow up for lab results (TSH), high cholesterol and loose stools   Subjective: Alicia [name] is a 63 y.o. with medical history significant for T2DM, gastroparesis, GERD, HLD, HTN and DM neuropathy who is presenting today for follow up.    Elevated TSH  Last office visit, patient was evaluated after having elevated TSH. Patient was concerned that she had developed hyperthyroidism as she was experiencing heart palpitations, diarrhea, increased anxiety and weight loss. On repeat measurements, her thyroid levels were within normal limits. We reviewed these results and will continue to monitor patient's symptoms.   Elevated Cholesterol  Last cholesterol levels in July of 2020 showed elevated total cholesterol of 222 and elevated LDL of 122. Patient was started on Zetia but now reports that she has developed an allergy related this prescription. She reports that every time she takes this medicine she has pain in her right abdomen that radiates to her back. She reports that she has since stopped taking the medication and is no longer experiencing the pain. Patient is noted to have intolerance to statin and now Zetia. We discussed that patient may need to discuss this with her cardiologist at her next appointment and be considered for a lipid clinic referral. Patient was in agreement with this plan.    Loose Stools  Alicia [name] also reports that she continues to have daily loose stools. She denies any blood or mucous in her stools and states that they are not watery but just loose. She denies any instances of bowel incontinence. Patient is noted to have history of gastroparesis that could be contributing to this. Patient also states she is unable to eat a large quantity of vegetables as they seem to make her loose stools worse. She has been unable to identify any specific  triggers for the loose stools. We discussed increasing fiber slowly to help bulk up stools. Patient also reports no relief with OTC anti-diarrhea medications.    I have reviewed the patient's medical, surgical, family, and social history as appropriate.  Vitals:   04/17/19 1605  BP: 130/62  Pulse: 78  SpO2: 96%    Physical Exam:  General: Alert and cooperative and appears to be in no acute distress Cardio: Normal S1 and S2, no S3 or S4. Rhythm is regular. No murmurs or rubs.   Pulm: Clear to auscultation bilaterally, no crackles, wheezing, or diminished breath sounds. Normal respiratory effort Abdomen: Bowel sounds normal. Abdomen soft and non-tender. Abdomen is soft.  Extremities: No peripheral edema. Neuro: alert and oriented x4   Assessment & Plan:   Type 2 diabetes mellitus with ophthalmic complication (HCC) Patient Hemoglobin A1c repeat today 6.5 -continue Metformin   HYPERCHOLESTEROLEMIA Patient with history of elevated cholesterol. Unable to tolerate statin nor Zetia due to side effects of myalgia and abdominal/back pain.  -recommend patient discuss referral to lipid clinic at next cardiology appointment  -patient will need repeat lipid panel   Frequent loose stools Patient reports daily loose stools, denies bloody stools or melena. Patient states these loose stools pre-date her starting Metformin for her DM. Patient states that OTC anti-diarrhea medications have provided no relief and has not been able to identify any absolute triggers outside of vegetables.  -discussed importance of maintaining oral hydration  -encouraged patient to increase fiber intake  -referral placed to GI   Return to care as needed.  Stark Klein, MD  Family Medicine  PGY1

## 2019-04-20 DIAGNOSIS — R197 Diarrhea, unspecified: Secondary | ICD-10-CM | POA: Insufficient documentation

## 2019-04-20 NOTE — Assessment & Plan Note (Addendum)
Patient reports daily loose stools, denies bloody stools or melena. Patient states these loose stools pre-date her starting Metformin for her DM. Patient states that OTC anti-diarrhea medications have provided no relief and has not been able to identify any absolute triggers outside of vegetables.  -discussed importance of maintaining oral hydration  -encouraged patient to increase fiber intake  -referral placed to GI

## 2019-04-20 NOTE — Assessment & Plan Note (Signed)
Patient with history of elevated cholesterol. Unable to tolerate statin nor Zetia due to side effects of myalgia and abdominal/back pain.  -recommend patient discuss referral to lipid clinic at next cardiology appointment  -patient will need repeat lipid panel

## 2019-04-20 NOTE — Assessment & Plan Note (Addendum)
Patient Hemoglobin A1c repeat today 6.5 -continue Metformin

## 2019-04-23 ENCOUNTER — Ambulatory Visit
Admission: RE | Admit: 2019-04-23 | Discharge: 2019-04-23 | Disposition: A | Discharge status: Home / Self Care | Payer: Medicare Other | Source: Ambulatory Visit | Attending: Nurse Practitioner | Admitting: Nurse Practitioner

## 2019-04-23 ENCOUNTER — Other Ambulatory Visit: Payer: Self-pay

## 2019-04-23 DIAGNOSIS — Z1231 Encounter for screening mammogram for malignant neoplasm of breast: Secondary | ICD-10-CM

## 2019-04-25 ENCOUNTER — Other Ambulatory Visit: Payer: Self-pay | Admitting: *Deleted

## 2019-04-25 DIAGNOSIS — E7800 Pure hypercholesterolemia, unspecified: Secondary | ICD-10-CM

## 2019-04-25 DIAGNOSIS — Z79899 Other long term (current) drug therapy: Secondary | ICD-10-CM

## 2019-04-29 ENCOUNTER — Other Ambulatory Visit: Payer: Medicare Other

## 2019-04-29 ENCOUNTER — Other Ambulatory Visit: Payer: Self-pay

## 2019-04-29 DIAGNOSIS — E7800 Pure hypercholesterolemia, unspecified: Secondary | ICD-10-CM | POA: Diagnosis not present

## 2019-04-29 DIAGNOSIS — Z79899 Other long term (current) drug therapy: Secondary | ICD-10-CM | POA: Diagnosis not present

## 2019-04-29 LAB — LIPID PANEL
Chol/HDL Ratio: 2.5 ratio (ref 0.0–4.4)
Cholesterol, Total: 206 mg/dL — ABNORMAL HIGH (ref 100–199)
HDL: 84 mg/dL (ref >39)
LDL Chol Calc (NIH): 112 mg/dL — ABNORMAL HIGH (ref 0–99)
Triglycerides: 52 mg/dL (ref 0–149)
VLDL Cholesterol Cal: 10 mg/dL (ref 5–40)

## 2019-04-30 ENCOUNTER — Other Ambulatory Visit: Payer: Self-pay | Admitting: Student

## 2019-04-30 MED ORDER — CARVEDILOL 25 MG PO TABS: ORAL_TABLET | ORAL | 3 refills | Status: DC

## 2019-05-06 ENCOUNTER — Telehealth: Payer: Self-pay

## 2019-05-06 ENCOUNTER — Ambulatory Visit (INDEPENDENT_AMBULATORY_CARE_PROVIDER_SITE_OTHER): Payer: Medicare Other | Admitting: Cardiology

## 2019-05-06 ENCOUNTER — Encounter: Payer: Self-pay | Admitting: Cardiology

## 2019-05-06 ENCOUNTER — Other Ambulatory Visit: Payer: Self-pay

## 2019-05-06 VITALS — BP 146/78 | HR 86 | Ht 63.0 in | Wt 154.6 lb

## 2019-05-06 DIAGNOSIS — I471 Supraventricular tachycardia: Secondary | ICD-10-CM

## 2019-05-06 MED ORDER — DILTIAZEM HCL ER COATED BEADS 180 MG PO CP24: 180.0000 mg | ORAL_CAPSULE | Freq: Every day | ORAL | 3 refills | Status: DC

## 2019-05-06 NOTE — Telephone Encounter (Signed)
The patient called and I told her that we would discuss a Lipid Clinic referral this afternoon at her OV.  She verbalized understanding.

## 2019-05-06 NOTE — Patient Instructions (Addendum)
Medication Instructions:  Your physician has recommended you make the following change in your medication:  1. START Diltiazem 180 mg once daily  * If you need a refill on your cardiac medications before your next appointment, please call your pharmacy.   Labwork: None ordered  Testing/Procedures: None ordered  Follow-Up: You are scheduled for a 3 month follow up with Dr. Curt Bears on 08/12/2019 @ 3:15 pm  Thank you for choosing [facility]!!   Trinidad Curet, RN [phone]  Any Other Special Instructions Will Be Listed Below (If Applicable).  Diltiazem tablets What is this medicine? DILTIAZEM (dil TYE a zem) is a calcium-channel blocker. It affects the amount of calcium found in your heart and muscle cells. This relaxes your blood vessels, which can reduce the amount of work the heart has to do. This medicine is used to treat chest pain caused by angina. This medicine may be used for other purposes; ask your health care provider or pharmacist if you have questions. COMMON BRAND NAME(S): Cardizem What should I tell my health care provider before I take this medicine? They need to know if you have any of these conditions:  heart problems, low blood pressure, irregular heartbeat  liver disease  previous heart attack  an unusual or allergic reaction to diltiazem, other medicines, foods, dyes, or preservatives  pregnant or trying to get pregnant  breast-feeding How should I use this medicine? Take this medicine by mouth with a glass of water. Follow the directions on the prescription label. Do not cut, crush or chew this medicine. This medicine is usually taken before meals and at bedtime. Take your doses at regular intervals. Do not take your medicine more often then directed. Do not stop taking except on the advice of your doctor or health care professional. Talk to your pediatrician regarding the use of this medicine in children. Special care may be needed. Overdosage:  If you think you have taken too much of this medicine contact a poison control center or emergency room at once. NOTE: This medicine is only for you. Do not share this medicine with others. What if I miss a dose? If you miss a dose, take it as soon as you can. If it is almost time for your next dose, take only that dose. Do not take double or extra doses. What may interact with this medicine? Do not take this medicine with any of the following:  cisapride  hawthorn  pimozide  ranolazine  red yeast rice This medicine may also interact with the following medications:  buspirone  carbamazepine  cimetidine  cyclosporine  digoxin  local anesthetics or general anesthetics  lovastatin  medicines for anxiety or difficulty sleeping like midazolam and triazolam  medicines for high blood pressure or heart problems  quinidine  rifampin, rifabutin, or rifapentine This list may not describe all possible interactions. Give your health care provider a list of all the medicines, herbs, non-prescription drugs, or dietary supplements you use. Also tell them if you smoke, drink alcohol, or use illegal drugs. Some items may interact with your medicine. What should I watch for while using this medicine? Check your blood pressure and pulse rate regularly. Ask your doctor or health care professional what your blood pressure and pulse rate should be and when you should contact him or her. You may feel dizzy or lightheaded. Do not drive, use machinery, or do anything that needs mental alertness until you know how this medicine affects you. To reduce the risk of  dizzy or fainting spells, do not sit or stand up quickly, especially if you are an older patient. Alcohol can make you more dizzy or increase flushing and rapid heartbeats. Avoid alcoholic drinks. What side effects may I notice from receiving this medicine? Side effects that you should report to your doctor or health care professional as  soon as possible:  allergic reactions like skin rash, itching or hives, swelling of the face, lips, or tongue  confusion, mental depression  feeling faint or lightheaded, falls  pinpoint red spots on the skin  redness, blistering, peeling or loosening of the skin, including inside the mouth  slow, irregular heartbeat  swelling of the ankles, feet  unusual bleeding or bruising Side effects that usually do not require medical attention (report to your doctor or health care professional if they continue or are bothersome):  change in sex drive or performance  constipation or diarrhea  flushing of the face  headache  nausea, vomiting  tired or weak  trouble sleeping This list may not describe all possible side effects. Call your doctor for medical advice about side effects. You may report side effects to FDA at 1-800-FDA-1088. Where should I keep my medicine? Keep out of the reach of children. Store at room temperature between 20 and 25 degrees C (68 and 77 degrees F). Protect from light. Keep container tightly closed. Throw away any unused medicine after the expiration date. NOTE: This sheet is a summary. It may not cover all possible information. If you have questions about this medicine, talk to your doctor, pharmacist, or health care provider.  2020 Elsevier/Gold Standard (2013-06-02 10:54:31)

## 2019-05-06 NOTE — Telephone Encounter (Signed)
-----   Message from Darreld Mclean, [place] sent at 05/05/2019  6:23 PM EST ----- Patient's PCP drew these labs and I think forwarded them to [name]. She has been intolerant to statins and Zetia in the past. It looks like PCP advised patient to ask [name] about referral to our lipid clinic at her next Cardiology appointment. Can we call patient and see if she would like [name] to go ahead and refer her to our lipid clinic?   Thank you!

## 2019-05-06 NOTE — Progress Notes (Signed)
Electrophysiology Office Note   Date:  05/06/2019   ID:  Alicia [name], DOB 1955/11/19, MRN [id number]  PCP:  Stark Klein, MD  Cardiologist:  End Primary Electrophysiologist:  Nickolaos Brallier Meredith Leeds, MD    Chief Complaint: SVT   History of Present Illness: Alicia [name] is a 63 y.o. female who is being seen today for the evaluation of SVT at the request of Liliane Shi, PA-C. Presenting today for electrophysiology evaluation.  She has a history of SVT, hypertension, and hyperlipidemia.  Also has diabetes, sleep apnea, and deafness.  She presented to cardiology clinic February 2019 for evaluation of atypical chest pain.  She also was having palpitations.  She had a Myoview which was low risk, and a cardiac monitor that showed episodes of narrow complex tachycardia.  She is continued to have episodes of palpitations.  Episodes can be triggered by changes in blood sugar, anxiety, or climbing stairs.  They can also occur when laying down.  Episodes can last from a few minutes to up to 15 minutes.  She is currently undergoing therapy for hyperthyroidism.  She does have sleep apnea but has been unable to get her CPAP machine and during the pandemic.  Today, she denies symptoms of palpitations, chest pain, shortness of breath, orthopnea, PND, lower extremity edema, claudication, dizziness, presyncope, syncope, bleeding, or neurologic sequela. The patient is tolerating medications without difficulties.    Past Medical History:  Diagnosis Date  . Anemia   . ARTHRITIS, KNEE 09/17/2007  . Asthma 04/04/2010  . Cataract 01/07/2019  . Closed fracture of distal end of left radius 11/01/2015  . Complete deafness    Meningitis at age 35  . Deaf   . Diabetes mellitus   . Diabetic neuropathy ([place])   . Ganglion cyst 06/22/2011  . Gastroparesis   . GERD (gastroesophageal reflux disease)   . H/O: C-section   . Hyperlipidemia   . Hypertension   . Neuromuscular disorder ([place])    diabetic  neuropathy  . PSVT (paroxysmal supraventricular tachycardia) ([place]) 11/09/2017   Event monitor 09/14/2017 - Predominantly sinus rhythm with episodes of narrow-complex tachycardia suggestive of paroxysmal supraventricular tachycardia.  . S/P appy    Past Surgical History:  Procedure Laterality Date  . APPENDECTOMY    . cardiolyte EF 77%, no ischemia in 2006  2006  . CARPAL TUNNEL RELEASE Right 04/20/2015   Procedure: RIGHT CARPAL TUNNEL RELEASE;  Surgeon: Daryll Brod, MD;  Location: [place] [facility];  Service: Orthopedics;  Laterality: Right;  . CARPAL TUNNEL RELEASE Left 11/04/2015   Procedure: CARPAL TUNNEL RELEASE;  Surgeon: Daryll Brod, MD;  Location: [facility];  Service: Orthopedics;  Laterality: Left;  . CESAREAN SECTION    . OPEN REDUCTION INTERNAL FIXATION (ORIF) DISTAL RADIAL FRACTURE Left 11/04/2015   Procedure: OPEN REDUCTION INTERNAL FIXATION (ORIF) LEFT DISTAL RADIAL FRACTURE POSSIBLE BONE GRAFT;  Surgeon: Daryll Brod, MD;  Location: [facility];  Service: Orthopedics;  Laterality: Left;  . TRIGGER FINGER RELEASE Right 04/20/2015   Procedure: RELEASE TRIGGER FINGER/A-1 PULLEY RIGHT MIDDLE FINGER,RIGHT RING FINGER;  Surgeon: Daryll Brod, MD;  Location: [facility] [place];  Service: Orthopedics;  Laterality: Right;  . TUBAL LIGATION    . ULNAR NERVE TRANSPOSITION Right 04/20/2015   Procedure: RIGHT ULNAR NERVE DECOMPRESSION;  Surgeon: Daryll Brod, MD;  Location: [facility];  Service: Orthopedics;  Laterality: Right;  . UTERINE FIBROID EMBOLIZATION  2007  . WRIST SURGERY  Cyst removed on left  . WRIST SURGERY Right 1985   tendon repair R wrist     Current Outpatient Medications  Medication Sig Dispense Refill  . ACCU-CHEK SOFTCLIX LANCETS lancets TEST 4 TIMES A DAY 200 each 0  . acetaminophen (TYLENOL) 500 MG tablet Take 500 mg by mouth every 4 (four) hours as needed.    Marland Kitchen aspirin (BAYER CHILDRENS ASPIRIN) 81 MG chewable  tablet Chew 81 mg by mouth daily.      . carvedilol (COREG) 25 MG tablet TAKE 1 TABLET (25 MG TOTAL) BY MOUTH 2 (TWO) TIMES DAILY WITH A MEAL. 180 tablet 3  . cholecalciferol (VITAMIN D) 1000 UNITS tablet Take 1,000 Units by mouth daily.    Marland Kitchen diltiazem (CARDIZEM) 30 MG tablet Take 1 tablet (30 mg total) by mouth as needed (Heart racing or palpitations that last longer than 15 mins). 30 tablet 2  . ferrous sulfate 325 (65 FE) MG tablet Take 325 mg by mouth daily with breakfast.    . fluticasone (FLONASE) 50 MCG/ACT nasal spray SPRAY 2 SPRAYS INTO EACH NOSTRIL EVERY DAY 16 g 3  . gabapentin (NEURONTIN) 400 MG capsule TAKE 3 CAPSULES BY MOUTH TWICE A DAY AS NEEDED FOR PAIN 540 capsule 2  . glucose blood test strip CHECK SUGAR 3 TIMES A DAILY. 100 each 12  . hydrochlorothiazide (HYDRODIURIL) 12.5 MG tablet TAKE 1 TABLET BY MOUTH EVERY DAY 90 tablet 3  . Insulin Glargine (BASAGLAR KWIKPEN) 100 UNIT/ML SOPN INJECT 8 UNITS INTO THE SKIN DAILY. 15 pen 3  . Insulin Pen Needle 31G X 5 MM MISC 1 Container by Does not apply route once as needed for up to 1 dose. 100 each 11  . liraglutide (VICTOZA) 18 MG/3ML SOPN Inject 0.1 mLs (0.6 mg total) into the skin 1 day or 1 dose for 120 doses. 3 pen 3  . losartan (COZAAR) 100 MG tablet TAKE 1 TABLET BY MOUTH EVERY DAY 90 tablet 3  . meloxicam (MOBIC) 15 MG tablet TAKE 1 TABLET BY MOUTH EVERY DAY 30 tablet 1  . metFORMIN (GLUCOPHAGE) 1000 MG tablet TAKE 1 TABLET BY MOUTH TWICE A DAY 180 tablet 3  . NOVOLOG 100 UNIT/ML injection INJECT 1-4 UNITS INTO THE SKIN 3 TIMES DAILY WITH MEALS. 30 mL 0  . Olopatadine HCl 0.2 % SOLN PLACE 1 DROP IN BOTH EYES DAILY    . omeprazole (PRILOSEC) 20 MG capsule TAKE 1 CAPSULE BY MOUTH EVERY DAY 90 capsule 1  . senna-docusate (SENOKOT-S) 8.6-50 MG tablet Take 1 tablet by mouth daily as needed for mild constipation or moderate constipation. 30 tablet 0   Current Facility-Administered Medications  Medication Dose Route Frequency  Provider Last Rate Last Dose  . fexofenadine (ALLEGRA) tablet 60 mg  60 mg Oral BID Benay Pike, MD        Allergies:   Sulfonamide derivatives, Lipitor [atorvastatin calcium], Ramipril, Trulicity [dulaglutide], Atorvastatin, and Sulfamethoxazole   Social History:  The patient  reports that she has never smoked. She has never used smokeless tobacco. She reports current alcohol use of about 3.0 standard drinks of alcohol per week. She reports that she does not use drugs.   Family History:  The patient's family history includes Cancer in her maternal aunt and maternal grandmother; Diabetes in her father; Heart attack (age of onset: 32) in her mother; Hypertension in her mother.    ROS:  Please see the history of present illness.   Otherwise, review of systems is positive for  none.   All other systems are reviewed and negative.    PHYSICAL EXAM: VS:  BP (!) 146/78   Pulse 86   Ht 5\' 3"  (1.6 m)   Wt 154 lb 9.6 oz (70.1 kg)   SpO2 97%   BMI 27.39 kg/m  , BMI Body mass index is 27.39 kg/m. GEN: Well nourished, well developed, in no acute distress  HEENT: normal  Neck: no JVD, carotid bruits, or masses Cardiac: RRR; no murmurs, rubs, or gallops,no edema  Respiratory:  clear to auscultation bilaterally, normal work of breathing GI: soft, nontender, nondistended, + BS MS: no deformity or atrophy  Skin: warm and dry Neuro:  Strength and sensation are intact Psych: euthymic mood, full affect  EKG:  EKG is not ordered today. Personal review of the ekg ordered 04/16/19 shows him, rate 79  Recent Labs: 01/07/2019: BUN 15; Creatinine, Ser 0.72; Hemoglobin 11.4; Platelets 220; Potassium 4.2; Sodium 140 03/07/2019: TSH 1.640    Lipid Panel     Component Value Date/Time   CHOL 206 (H) 04/29/2019 0748   TRIG 52 04/29/2019 0748   HDL 84 04/29/2019 0748   CHOLHDL 2.5 04/29/2019 0748   CHOLHDL 2.3 07/28/2016 0936   VLDL 12 07/28/2016 0936   LDLCALC 112 (H) 04/29/2019 0748   LDLDIRECT 78  07/10/2013 1441     Wt Readings from Last 3 Encounters:  05/06/19 154 lb 9.6 oz (70.1 kg)  04/17/19 154 lb 4 oz (70 kg)  04/16/19 156 lb 3.2 oz (70.9 kg)      Other studies Reviewed: Additional studies/ records that were reviewed today include: Monitor 10/22/17 personally reviewed  Review of the above records today demonstrates:   The patient was monitored for 14 days.  The predominant rhythm was sinus; average heart rate during triggered events was 94 bpm.  Two episodes of narrow complex tachycardia were observed, suggestive of SVT.  No ventricular arrhythmias or prolonged pauses were seen.  Patient triggered events corresponding to rapid heart rate/palpitations/flutters correspond to sinus rhythm and narrow-complex tachycardia.   SPECT 09/15/18 1. EF 61%, normal wall motion.  2. No evidence for ischemia or infarction by perfusion images.     ASSESSMENT AND PLAN:  1.  SVT: On cardiac monitor.  She is currently on carvedilol and as needed Cardizem.  She is continuing to have palpitations.  I spoke with her about the possibility of ablation.  Risks and benefits discussed include bleeding, tamponade, heart block, stroke, among others.  She would like to try medical management prior to ablation.  We Octavian Godek start her on 180 mg of diltiazem in addition to carvedilol.  2.  Hypertension: Mildly elevated.  Starting Cardizem.  3.  Hyperlipidemia: Intolerant to multiple statins.  Plan per PCP.  Current medicines are reviewed at length with the patient today.   The patient does not have concerns regarding her medicines.  The following changes were made today: Start diltiazem  Labs/ tests ordered today include:  No orders of the defined types were placed in this encounter.    Disposition:   FU with Berit Raczkowski 3 months  Signed, Jaylean Buenaventura Meredith Leeds, MD  05/06/2019 4:05 PM     [place] [address] [phone] (office)  [phone] (fax)

## 2019-05-06 NOTE — Telephone Encounter (Signed)
Notes recorded by Frederik Schmidt, RN on 05/06/2019 at 8:36 AM EST  [id number] 11/3  ------

## 2019-05-08 ENCOUNTER — Ambulatory Visit (INDEPENDENT_AMBULATORY_CARE_PROVIDER_SITE_OTHER): Payer: Medicare Other | Admitting: Student in an Organized Health Care Education/Training Program

## 2019-05-08 ENCOUNTER — Other Ambulatory Visit: Payer: Self-pay

## 2019-05-08 ENCOUNTER — Ambulatory Visit (HOSPITAL_COMMUNITY)
Admission: RE | Admit: 2019-05-08 | Discharge: 2019-05-08 | Disposition: A | Discharge status: Home / Self Care | Payer: Medicare Other | Source: Ambulatory Visit | Attending: Family Medicine | Admitting: Family Medicine

## 2019-05-08 VITALS — BP 156/80 | HR 81 | Wt 153.0 lb

## 2019-05-08 DIAGNOSIS — R079 Chest pain, unspecified: Secondary | ICD-10-CM | POA: Insufficient documentation

## 2019-05-08 DIAGNOSIS — R9431 Abnormal electrocardiogram [ECG] [EKG]: Secondary | ICD-10-CM | POA: Diagnosis not present

## 2019-05-08 DIAGNOSIS — E11311 Type 2 diabetes mellitus with unspecified diabetic retinopathy with macular edema: Secondary | ICD-10-CM | POA: Diagnosis not present

## 2019-05-08 LAB — GLUCOSE, POCT (MANUAL RESULT ENTRY): POC Glucose: 71 mg/dl (ref 70–99)

## 2019-05-08 MED ORDER — DILTIAZEM HCL ER COATED BEADS 240 MG PO CP24: 240.0000 mg | ORAL_CAPSULE | Freq: Every day | ORAL | 0 refills | Status: DC

## 2019-05-08 MED ORDER — NITROGLYCERIN 0.3 MG SL SUBL: 0.3000 mg | SUBLINGUAL_TABLET | SUBLINGUAL | 0 refills | Status: DC | PRN

## 2019-05-08 NOTE — Progress Notes (Signed)
Subjective:    Patient ID: Alicia [name], female    DOB: Apr 08, 1956, 63 y.o.   MRN: [id number]  CC: Chest pain  HPI:  63 year old female with history of PSVT, diabetes, GERD, deafness presents with 1 week history of abnormal chest pain.  Approximately 2-3 times per day she is experiencing a sharp tightening or spasm type pain under her breasts and mid chest which seems to radiate outwards.  The pain lasts approximately 30 to 60 minutes and resolved spontaneously.  Has not been doing anything in particular when the pain onsets acutely so was not exertional in nature.  She has been at home for work.  3 times she has had vomiting associated with the pain.  Also feels like her heart is racing.  She had a telephone encounter with her cardiologist 2 days ago and was prescribed 180 mg diltiazem which she has taken for 2 days now and has not have resolution of symptoms.  She has also been taking fiber supplement and Pepto-Bismol which have not impacted her symptoms.  At times, the pain has occurred at night and woken her up from sleep.  Prior to the onset of the symptoms a week ago the patient felt completely healthy and like herself.  When the pain is not occurring patient also feels at her baseline.  She does not have the chest pain currently while in the office. She has noticed her blood sugars have been elevated over the past week in the 250s range.  Smoking status reviewed   ROS: pertinent noted in the HPI   I have personally reviewed pertinent past medical history, surgical, family, and social history as appropriate. Objective:  BP (!) 156/80   Pulse 81   Wt 153 lb (69.4 kg)   SpO2 98%   BMI 27.10 kg/m   Vitals and nursing note reviewed  General: NAD, pleasant, able to participate in exam Cardiac: RRR, S1 S2 present. normal heart sounds, no murmurs. Respiratory: CTAB, normal effort, No wheezes, rales or rhonchi Extremities: no edema or cyanosis. Skin: warm and dry, no rashes noted  Neuro: alert, no obvious focal deficits Psych: Normal affect and mood  Assessment & Plan:   Chest pain Although patient has recent cardiology visit and history of chest pain, this pain is new and different than anything she is experienced in the past. Pain could be a symptom of her chronic SVT but cannot without a doubt rule out ACS.  Recommended patient go to emergency department for further work-up but she declined at this time.  ECG is normal sinus rhythm without ST segment or T wave abnormalities.  QTc 423 and heart rate 77. Could also be symptoms of hyperthyroidism.  Follow-up per PCP. -Prescribed increased dose of diltiazem to 40 mg -Prescribed sublingual nitrogen and provided patient with instructions on how to use. -Recommended patient call her cardiologist in the morning to describe the new symptoms. -Provided patient with counseling for signs and symptoms to go to the emergency department. -Would recommend patient being on long-term statin medication for disease modification and as she was intolerant to atorvastatin could try pravastatin.  Type 2 diabetes mellitus with ophthalmic complication (HCC) CBG today was 71. To monitor at home and take insulin as prescribed Follow-up with PCP  Orders Placed This Encounter  Procedures  . Glucose (CBG)  . EKG 12-Lead    Meds ordered this encounter  Medications  . diltiazem (CARDIZEM CD) 240 MG 24 hr capsule    Sig: Take 1  capsule (240 mg total) by mouth daily.    Dispense:  30 capsule    Refill:  0  . nitroGLYCERIN (NITROSTAT) 0.3 MG SL tablet    Sig: Place 1 tablet (0.3 mg total) under the tongue every 5 (five) minutes as needed for chest pain.    Dispense:  30 tablet    Refill:  0    Doristine Mango, [place] PGY-2

## 2019-05-08 NOTE — Assessment & Plan Note (Addendum)
Although patient has recent cardiology visit and history of chest pain, this pain is new and different than anything she is experienced in the past. Pain could be a symptom of her chronic SVT but cannot without a doubt rule out ACS.  Recommended patient go to emergency department for further work-up but she declined at this time.  ECG is normal sinus rhythm without ST segment or T wave abnormalities.  QTc 423 and heart rate 77. Could also be symptoms of hyperthyroidism.  Follow-up per PCP. -Prescribed increased dose of diltiazem to 40 mg -Prescribed sublingual nitrogen and provided patient with instructions on how to use. -Recommended patient call her cardiologist in the morning to describe the new symptoms. -Provided patient with counseling for signs and symptoms to go to the emergency department. -Would recommend patient being on long-term statin medication for disease modification and as she was intolerant to atorvastatin could try pravastatin.

## 2019-05-08 NOTE — Patient Instructions (Signed)
It was a pleasure to see you today!  To summarize our discussion for this visit:  I am increasing your dose of diltiazem to 240 mg daily.  I am also prescribing nitroglycerin tablets that will dissolve under your tongue.  Take 1 of these tablets if you start experiencing a chest pain again to see if that will help with the pain.  It is very important that you call your heart doctor office in the morning to let them know the symptoms you have been experiencing.  Please go to the emergency department if you are experiencing the symptoms that do not resolve or seem like they are getting worse.  Call the clinic at [phone] if your symptoms worsen or you have any concerns.   Thank you for allowing me to take part in your care,  Dr. Doristine Mango

## 2019-05-08 NOTE — Assessment & Plan Note (Signed)
CBG today was 71. To monitor at home and take insulin as prescribed Follow-up with PCP

## 2019-05-09 ENCOUNTER — Other Ambulatory Visit: Payer: Self-pay

## 2019-05-09 ENCOUNTER — Telehealth: Payer: Self-pay | Admitting: Cardiology

## 2019-05-09 ENCOUNTER — Emergency Department (HOSPITAL_COMMUNITY): Payer: Medicare Other

## 2019-05-09 ENCOUNTER — Encounter (HOSPITAL_COMMUNITY): Payer: Self-pay

## 2019-05-09 ENCOUNTER — Emergency Department (HOSPITAL_COMMUNITY)
Admission: EM | Admit: 2019-05-09 | Discharge: 2019-05-09 | Disposition: A | Discharge status: Home / Self Care | Payer: Medicare Other | Attending: Emergency Medicine | Admitting: Emergency Medicine

## 2019-05-09 DIAGNOSIS — I10 Essential (primary) hypertension: Secondary | ICD-10-CM | POA: Insufficient documentation

## 2019-05-09 DIAGNOSIS — R1010 Upper abdominal pain, unspecified: Secondary | ICD-10-CM

## 2019-05-09 DIAGNOSIS — R1011 Right upper quadrant pain: Secondary | ICD-10-CM | POA: Insufficient documentation

## 2019-05-09 DIAGNOSIS — Z794 Long term (current) use of insulin: Secondary | ICD-10-CM | POA: Insufficient documentation

## 2019-05-09 DIAGNOSIS — R0602 Shortness of breath: Secondary | ICD-10-CM | POA: Diagnosis not present

## 2019-05-09 DIAGNOSIS — Z79899 Other long term (current) drug therapy: Secondary | ICD-10-CM | POA: Diagnosis not present

## 2019-05-09 DIAGNOSIS — R110 Nausea: Secondary | ICD-10-CM | POA: Diagnosis not present

## 2019-05-09 DIAGNOSIS — R0789 Other chest pain: Secondary | ICD-10-CM | POA: Diagnosis not present

## 2019-05-09 DIAGNOSIS — R109 Unspecified abdominal pain: Secondary | ICD-10-CM | POA: Diagnosis not present

## 2019-05-09 DIAGNOSIS — R079 Chest pain, unspecified: Secondary | ICD-10-CM | POA: Insufficient documentation

## 2019-05-09 DIAGNOSIS — E119 Type 2 diabetes mellitus without complications: Secondary | ICD-10-CM | POA: Diagnosis not present

## 2019-05-09 DIAGNOSIS — Z7982 Long term (current) use of aspirin: Secondary | ICD-10-CM | POA: Insufficient documentation

## 2019-05-09 LAB — BASIC METABOLIC PANEL
Anion gap: 9 (ref 5–15)
BUN: 11 mg/dL (ref 8–23)
CO2: 28 mmol/L (ref 22–32)
Calcium: 10.7 mg/dL — ABNORMAL HIGH (ref 8.9–10.3)
Chloride: 101 mmol/L (ref 98–111)
Creatinine, Ser: 0.68 mg/dL (ref 0.44–1.00)
GFR calc Af Amer: >60 mL/min (ref >60)
GFR calc non Af Amer: >60 mL/min (ref >60)
Glucose, Bld: 137 mg/dL — ABNORMAL HIGH (ref 70–99)
Potassium: 3.6 mmol/L (ref 3.5–5.1)
Sodium: 138 mmol/L (ref 135–145)

## 2019-05-09 LAB — CBC
HCT: 35.6 % — ABNORMAL LOW (ref 36.0–46.0)
Hemoglobin: 11.4 g/dL — ABNORMAL LOW (ref 12.0–15.0)
MCH: 28.6 pg (ref 26.0–34.0)
MCHC: 32 g/dL (ref 30.0–36.0)
MCV: 89.2 fL (ref 80.0–100.0)
Platelets: 372 10*3/uL (ref 150–400)
RBC: 3.99 MIL/uL (ref 3.87–5.11)
RDW: 14.1 % (ref 11.5–15.5)
WBC: 5.7 10*3/uL (ref 4.0–10.5)
nRBC: 0 % (ref 0.0–0.2)

## 2019-05-09 LAB — HEPATIC FUNCTION PANEL
ALT: 14 U/L (ref 0–44)
AST: 19 U/L (ref 15–41)
Albumin: 3.7 g/dL (ref 3.5–5.0)
Alkaline Phosphatase: 72 U/L (ref 38–126)
Bilirubin, Direct: 0.1 mg/dL (ref 0.0–0.2)
Indirect Bilirubin: 0.3 mg/dL (ref 0.3–0.9)
Total Bilirubin: 0.4 mg/dL (ref 0.3–1.2)
Total Protein: 7.4 g/dL (ref 6.5–8.1)

## 2019-05-09 LAB — TROPONIN I (HIGH SENSITIVITY)
Troponin I (High Sensitivity): 3 ng/L (ref <18)
Troponin I (High Sensitivity): 4 ng/L (ref <18)

## 2019-05-09 LAB — LIPASE, BLOOD: Lipase: 23 U/L (ref 11–51)

## 2019-05-09 MED ORDER — FAMOTIDINE 20 MG PO TABS: 20.0000 mg | ORAL_TABLET | Freq: Two times a day (BID) | ORAL | 0 refills | Status: DC

## 2019-05-09 MED ORDER — SODIUM CHLORIDE 0.9 % IV BOLUS: 1000.0000 mL | Freq: Once | INTRAVENOUS | Status: DC

## 2019-05-09 MED ORDER — MORPHINE SULFATE (PF) 4 MG/ML IV SOLN
4.0000 mg | Freq: Once | INTRAVENOUS | Status: AC
  Administered 2019-05-09: 4 mg via INTRAVENOUS
  Filled 2019-05-09: qty 1

## 2019-05-09 NOTE — ED Provider Notes (Signed)
[facility] EMERGENCY DEPARTMENT Provider Note   CSN: [id number] Arrival date & time: 05/09/19  1505     History   Chief Complaint Chief Complaint  Patient presents with  . Nausea  . Chest Pain    HPI Alicia [name] is a 63 y.o. female.     HPI  63 year old female presents with on and off chest pain for about a week.  History taken with help of family, who interprets with sign language.  The patient has had multiple episodes but not every day of chest pressure that last about 30 minutes.  Sometimes with vomiting.  Also pain in the upper abdomen.  Seems to come on spontaneously with no obvious cause.  Some shortness of breath.  She will often have palpitations and was recently diagnosed with SVT.  The patient currently is having about 2 out of 10 discomfort.  Wraps around to her back along the right side.  Past Medical History:  Diagnosis Date  . Anemia   . ARTHRITIS, KNEE 09/17/2007  . Asthma 04/04/2010  . Cataract 01/07/2019  . Closed fracture of distal end of left radius 11/01/2015  . Complete deafness    Meningitis at age 34  . Deaf   . Diabetes mellitus   . Diabetic neuropathy ([place])   . Ganglion cyst 06/22/2011  . Gastroparesis   . GERD (gastroesophageal reflux disease)   . H/O: C-section   . Hyperlipidemia   . Hypertension   . Neuromuscular disorder ([place])    diabetic neuropathy  . PSVT (paroxysmal supraventricular tachycardia) ([place]) 11/09/2017   Event monitor 09/14/2017 - Predominantly sinus rhythm with episodes of narrow-complex tachycardia suggestive of paroxysmal supraventricular tachycardia.  . S/P appy     Patient Active Problem List   Diagnosis Date Noted  . Frequent loose stools 04/20/2019  . Pruritus 03/07/2019  . Hyperthyroidism 03/07/2019  . Hypertension 01/07/2019  . Hypercalcemia 10/08/2018  . Seasonal allergies 08/27/2018  . Osteoarthritis involving multiple joints on both sides of body 05/25/2018  . Statin intolerance 12/26/2017   . PSVT (paroxysmal supraventricular tachycardia) ([place]) 11/09/2017  . Chest pain 08/30/2017  . Hyperlipidemia 08/30/2017  . OSA (obstructive sleep apnea) 08/31/2016  . Diabetes ([place]) 09/14/2015  . Seizure ([place]) 06/08/2014  . Iron deficiency anemia 07/01/2012  . Diabetic retinopathy ([place]) 07/21/2011  . GERD 12/30/2007  . Diabetic polyneuropathy ([place]) 08/30/2006  . Type 2 diabetes mellitus with ophthalmic complication ([place]) [id number]  . HYPERCHOLESTEROLEMIA 08/30/2006  . Hearing loss 08/30/2006    Past Surgical History:  Procedure Laterality Date  . APPENDECTOMY    . cardiolyte EF 77%, no ischemia in 2006  2006  . CARPAL TUNNEL RELEASE Right 04/20/2015   Procedure: RIGHT CARPAL TUNNEL RELEASE;  Surgeon: Daryll Brod, MD;  Location: [facility];  Service: Orthopedics;  Laterality: Right;  . CARPAL TUNNEL RELEASE Left 11/04/2015   Procedure: CARPAL TUNNEL RELEASE;  Surgeon: Daryll Brod, MD;  Location: [facility];  Service: Orthopedics;  Laterality: Left;  . CESAREAN SECTION    . OPEN REDUCTION INTERNAL FIXATION (ORIF) DISTAL RADIAL FRACTURE Left 11/04/2015   Procedure: OPEN REDUCTION INTERNAL FIXATION (ORIF) LEFT DISTAL RADIAL FRACTURE POSSIBLE BONE GRAFT;  Surgeon: Daryll Brod, MD;  Location: [facility];  Service: Orthopedics;  Laterality: Left;  . TRIGGER FINGER RELEASE Right 04/20/2015   Procedure: RELEASE TRIGGER FINGER/A-1 PULLEY RIGHT MIDDLE FINGER,RIGHT RING FINGER;  Surgeon: Daryll Brod, MD;  Location: [facility];  Service: Orthopedics;  Laterality:  Right;  Marland Kitchen TUBAL LIGATION    . ULNAR NERVE TRANSPOSITION Right 04/20/2015   Procedure: RIGHT ULNAR NERVE DECOMPRESSION;  Surgeon: Daryll Brod, MD;  Location: [facility];  Service: Orthopedics;  Laterality: Right;  . UTERINE FIBROID EMBOLIZATION  2007  . WRIST SURGERY     Cyst removed on left  . WRIST SURGERY Right 1985   tendon repair R wrist     OB History   No  obstetric history on file.      Home Medications    Prior to Admission medications   Medication Sig Start Date End Date Taking? Authorizing Provider  ACCU-CHEK SOFTCLIX LANCETS lancets TEST 4 TIMES A DAY 07/11/17   Everrett Coombe, MD  acetaminophen (TYLENOL) 500 MG tablet Take 500 mg by mouth every 4 (four) hours as needed.    [provider]  aspirin (BAYER CHILDRENS ASPIRIN) 81 MG chewable tablet Chew 81 mg by mouth daily.      [provider]  carvedilol (COREG) 25 MG tablet TAKE 1 TABLET (25 MG TOTAL) BY MOUTH 2 (TWO) TIMES DAILY WITH A MEAL. 04/30/19   Sande Rives E, PA-C  cholecalciferol (VITAMIN D) 1000 UNITS tablet Take 1,000 Units by mouth daily.    [provider]  diltiazem (CARDIZEM CD) 180 MG 24 hr capsule Take 1 capsule (180 mg total) by mouth daily. 05/06/19   Camnitz, Ocie Doyne, MD  diltiazem (CARDIZEM CD) 240 MG 24 hr capsule Take 1 capsule (240 mg total) by mouth daily. 05/08/19   Anderson, Chelsey L, DO  diltiazem (CARDIZEM) 30 MG tablet Take 1 tablet (30 mg total) by mouth as needed (Heart racing or palpitations that last longer than 15 mins). 04/16/19   Richardson Dopp T, PA-C  ferrous sulfate 325 (65 FE) MG tablet Take 325 mg by mouth daily with breakfast.    [provider]  fluticasone (FLONASE) 50 MCG/ACT nasal spray SPRAY 2 SPRAYS INTO EACH NOSTRIL EVERY DAY 10/31/18   Everrett Coombe, MD  gabapentin (NEURONTIN) 400 MG capsule TAKE 3 CAPSULES BY MOUTH TWICE A DAY AS NEEDED FOR PAIN 11/22/18   Everrett Coombe, MD  glucose blood test strip CHECK SUGAR 3 TIMES A DAILY. 08/15/18   Everrett Coombe, MD  hydrochlorothiazide (HYDRODIURIL) 12.5 MG tablet TAKE 1 TABLET BY MOUTH EVERY DAY 09/11/18   Everrett Coombe, MD  Insulin Glargine (BASAGLAR KWIKPEN) 100 UNIT/ML SOPN INJECT 8 UNITS INTO THE SKIN DAILY. 08/01/18   Everrett Coombe, MD  Insulin Pen Needle 31G X 5 MM MISC 1 Container by Does not apply route once as needed for up to 1 dose. 11/02/18   Everrett Coombe,  MD  liraglutide (VICTOZA) 18 MG/3ML SOPN Inject 0.1 mLs (0.6 mg total) into the skin 1 day or 1 dose for 120 doses. 01/29/19 05/29/19  Martyn Malay, MD  losartan (COZAAR) 100 MG tablet TAKE 1 TABLET BY MOUTH EVERY DAY 08/14/18   Everrett Coombe, MD  meloxicam (MOBIC) 15 MG tablet TAKE 1 TABLET BY MOUTH EVERY DAY 03/21/19   Stark Klein, MD  metFORMIN (GLUCOPHAGE) 1000 MG tablet TAKE 1 TABLET BY MOUTH TWICE A DAY 07/05/18   Everrett Coombe, MD  nitroGLYCERIN (NITROSTAT) 0.3 MG SL tablet Place 1 tablet (0.3 mg total) under the tongue every 5 (five) minutes as needed for chest pain. 05/08/19   Anderson, Chelsey L, DO  NOVOLOG 100 UNIT/ML injection INJECT 1-4 UNITS INTO THE SKIN 3 TIMES DAILY WITH MEALS. 06/23/18   Everrett Coombe, MD  Olopatadine HCl  0.2 % SOLN PLACE 1 DROP IN BOTH EYES DAILY 03/24/19   [provider]  omeprazole (PRILOSEC) 20 MG capsule TAKE 1 CAPSULE BY MOUTH EVERY DAY 02/25/19   Stark Klein, MD  senna-docusate (SENOKOT-S) 8.6-50 MG tablet Take 1 tablet by mouth daily as needed for mild constipation or moderate constipation. 10/09/18   Matilde Haymaker, MD    Family History Family History  Problem Relation Age of Onset  . Hypertension Mother   . Heart attack Mother 25  . Diabetes Father   . Cancer Maternal Aunt   . Cancer Maternal Grandmother   . Neuropathy Neg Hx   . Breast cancer Neg Hx     Social History Social History   Tobacco Use  . Smoking status: Never Smoker  . Smokeless tobacco: Never Used  Substance Use Topics  . Alcohol use: Yes    Alcohol/week: 3.0 standard drinks    Types: 3 Glasses of wine per week    Comment: 1-2 wine occasional   . Drug use: Never     Allergies   Sulfonamide derivatives, Lipitor [atorvastatin calcium], Ramipril, Trulicity [dulaglutide], Atorvastatin, and Sulfamethoxazole   Review of Systems Review of Systems  Respiratory: Positive for shortness of breath.   Cardiovascular: Positive for chest pain and palpitations. Negative  for leg swelling.  Gastrointestinal: Positive for abdominal pain and vomiting.  Musculoskeletal: Positive for back pain.  All other systems reviewed and are negative.    Physical Exam Updated Vital Signs BP (!) 161/87 (BP Location: Right Arm)   Pulse 73   Temp 98.5 F (36.9 C) (Oral)   Resp 18   SpO2 99%   Physical Exam Vitals signs and nursing note reviewed.  Constitutional:      Appearance: She is well-developed.  HENT:     Head: Normocephalic and atraumatic.     Right Ear: External ear normal.     Left Ear: External ear normal.     Nose: Nose normal.  Eyes:     General:        Right eye: No discharge.        Left eye: No discharge.  Cardiovascular:     Rate and Rhythm: Normal rate and regular rhythm.     Heart sounds: Normal heart sounds.  Pulmonary:     Effort: Pulmonary effort is normal.     Breath sounds: Normal breath sounds.  Abdominal:     Palpations: Abdomen is soft.     Tenderness: There is abdominal tenderness in the right upper quadrant and epigastric area.  Skin:    General: Skin is warm and dry.     Findings: No rash (no obvious vesicular rash).  Neurological:     Mental Status: She is alert.  Psychiatric:        Mood and Affect: Mood is not anxious.      ED Treatments / Results  Labs (all labs ordered are listed, but only abnormal results are displayed) Labs Reviewed  BASIC METABOLIC PANEL - Abnormal; Notable for the following components:      Result Value   Glucose, Bld 137 (*)    Calcium 10.7 (*)    All other components within normal limits  CBC - Abnormal; Notable for the following components:   Hemoglobin 11.4 (*)    HCT 35.6 (*)    All other components within normal limits  LIPASE, BLOOD  HEPATIC FUNCTION PANEL  TROPONIN I (HIGH SENSITIVITY)  TROPONIN I (HIGH SENSITIVITY)    EKG EKG Interpretation  Date/Time:  Friday May 09 2019 15:37:05 EST Ventricular Rate:  79 PR Interval:  164 QRS Duration: 82 QT Interval:  376  QTC Calculation: 431 R Axis:   44 Text Interpretation: Normal sinus rhythm Nonspecific ST and T wave abnormality Abnormal ECG Poor data quality in current ECG precludes serial comparison Confirmed by Sherwood Gambler [phone]) on 05/09/2019 9:17:10 PM  ED ECG REPORT   Date: 05/09/2019  Rate: 72  Rhythm: normal sinus rhythm  QRS Axis: normal  Intervals: normal  ST/T Wave abnormalities: normal  Conduction Disutrbances:none  Narrative Interpretation:   Old EKG Reviewed: unchanged  I have personally reviewed the EKG tracing and agree with the computerized printout as noted.  Radiology Dg Chest 2 View  Result Date: 05/09/2019 CLINICAL DATA:  Chest pain, palpitations EXAM: CHEST - 2 VIEW COMPARISON:  Radiograph 01/04/2013 FINDINGS: No consolidation, features of edema, pneumothorax, or effusion. Pulmonary vascularity is normally distributed. The cardiomediastinal contours are unremarkable. No acute osseous or soft tissue abnormality. IMPRESSION: No acute cardiopulmonary abnormality. Electronically Signed   By: Lovena Le M.D.   On: 05/09/2019 15:52    Procedures Procedures (including critical care time)  Medications Ordered in ED Medications  sodium chloride 0.9 % bolus 1,000 mL (has no administration in time range)     Initial Impression / Assessment and Plan / ED Course  I have reviewed the triage vital signs and the nursing notes.  Pertinent labs & imaging results that were available during my care of the patient were reviewed by me and considered in my medical decision making (see chart for details).        Patient's pain is hard to tell if it is cardiac or GI.  However she has troponins negative x2.  Initial ECG obtained appears nonspecific but is a poor data quality.  A repeat looks just like priors and without ischemia.  My suspicion is this is probably not ACS and I think with the troponins being negative, she would be amenable to outpatient follow-up.  Heart score of 4.  She  is tender in her upper abdomen so a right upper quadrant ultrasound was obtained.  Her labs and ultrasound are benign.  I will increase her Prilosec dose and add Pepcid as this is probably gastric.  However we discussed that while MI was ruled out, she needs to return if she develops any new or worsening symptoms.  Final Clinical Impressions(s) / ED Diagnoses   Final diagnoses:  Upper abdominal pain    ED Discharge Orders    None       Sherwood Gambler, MD 05/09/19 2338

## 2019-05-09 NOTE — ED Triage Notes (Addendum)
Pt complaining of nausea and chest pain and dizziness that started last week. Pt states she threw up at least 3-4 times over the last week. Pt was seen by PCP and told she had SVT and that she needed to come to the emergency room to be evaluated for a nstemi. Pt denies SHOB. Pt is deaf, daughter is with pt interpreting using sign language. Pt states she also took 1 nitro before coming.

## 2019-05-09 NOTE — Discharge Instructions (Addendum)
If you develop recurrent, continued, or worsening chest pain, abdominal pain, shortness of breath, fever, vomiting, abdominal or back pain, or any other new/concerning symptoms then return to the ER for evaluation.   Increase your Omeprazole (Prilosec) to 40 mg daily and start the pepcid that was prescribed.

## 2019-05-09 NOTE — ED Notes (Signed)
Patient and daughter verbalized understanding of discharge instructions, prescribed medication, and follow-up care. Time allotted for questions and teachback.

## 2019-05-09 NOTE — Telephone Encounter (Signed)
New Message  Pt c/o of Chest Pain: STAT if CP now or developed within 24 hours  1. Are you having CP right now? Yes  2. Are you experiencing any other symptoms (ex. SOB, nausea, vomiting, sweating)? Vomiting  3. How long have you been experiencing CP? Last week  4. Is your CP continuous or coming and going? Coming and Going  5. Have you taken Nitroglycerin? No ?

## 2019-05-09 NOTE — Telephone Encounter (Signed)
The patient called because she is having severe CP "in between her breasts" and is vomiting.  We used a sign language interpreter to communicate and I informed her to go to the nearest ED by EMS.  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

## 2019-05-12 ENCOUNTER — Telehealth: Payer: Self-pay | Admitting: Cardiology

## 2019-05-12 ENCOUNTER — Other Ambulatory Visit: Payer: Self-pay

## 2019-05-12 DIAGNOSIS — E785 Hyperlipidemia, unspecified: Secondary | ICD-10-CM

## 2019-05-12 MED ORDER — FAMOTIDINE 20 MG PO TABS: 20.0000 mg | ORAL_TABLET | Freq: Two times a day (BID) | ORAL | 0 refills | Status: DC

## 2019-05-12 NOTE — Telephone Encounter (Signed)
I spoke with pt through interpreter and let her know we had received her message about ED visit.  I scheduled pt for appointment in lipid clinic on 05/15/19.  She will need sign language interpreter.

## 2019-05-12 NOTE — Telephone Encounter (Signed)
See lab results. Pt also needs referral to lipid clinic. I placed call to pt. Message left by interpreter to call office.

## 2019-05-12 NOTE — Telephone Encounter (Signed)
Patient called back in regards to the ER visit that she was instructed to go to on Friday. Patient states that her heart is fine, she was not having a heart attack and no gallbladder issues. She was having acid reflux and was prescribed famotidine (PEPCID) 20 MG tablet. She states that this has helped and that she is feeling much better.

## 2019-05-12 NOTE — Telephone Encounter (Signed)
Patient calls nurse line stating she was diagnosed with acid reflux recently (ED) and given Pepcid. Patient would like a refill on this. Please advise.

## 2019-05-15 ENCOUNTER — Encounter: Payer: Self-pay | Admitting: Pharmacist

## 2019-05-15 ENCOUNTER — Ambulatory Visit (INDEPENDENT_AMBULATORY_CARE_PROVIDER_SITE_OTHER): Payer: Medicare Other | Admitting: Pharmacist

## 2019-05-15 ENCOUNTER — Other Ambulatory Visit: Payer: Self-pay

## 2019-05-15 DIAGNOSIS — E7800 Pure hypercholesterolemia, unspecified: Secondary | ICD-10-CM

## 2019-05-15 DIAGNOSIS — E782 Mixed hyperlipidemia: Secondary | ICD-10-CM | POA: Diagnosis not present

## 2019-05-15 MED ORDER — ROSUVASTATIN CALCIUM 5 MG PO TABS: 5.0000 mg | ORAL_TABLET | ORAL | 3 refills | Status: DC

## 2019-05-15 NOTE — Progress Notes (Signed)
Patient ID: [name]                 DOB: [birth year]                    MRN: [id number]     HPI: Alicia [name] is a 63 y.o. female patient of Alicia [name] and Alicia [name] referred to lipid clinic by Alicia Rives, PA. PMH is significant for  atypical stress test with negative nuclear stress test in 08/2017, paroxysmal SVT noted on 08/2017, hypertension, hyperlipidemia intolerant to statins, type 2 diabetes mellitus, sleep apnea, and deafness.  Patient presents today to the lipid clinic. She is accompanied by a sign Alicia [name]. She is a very pleasant. She reports that she had very bad pains from atorvastatin. Rosuvastatin caused muscle pains, but not nearly as bad as rosuvastatin. She eats a diet low in fat and high in fiber.     Current Medications: none Intolerances: zetia, rosuvastatin 5mg  daily, rosuvastatin 10mg  daily, atorvastatin 40mg  daily Risk Factors: ASCVD 10 year risk score 16.2%, DM, HTN LDL goal: <100  Diet: special K, skim milk, fruit  Tuna sandwich w/ whole wheat bread, chicken Baked chicken with vegetables, brown rice unsweet tea, sparkling water, diet coke  Exercise: 30-40 min 2 times a week   Family History: The patient's family history includes Cancer in her maternal aunt and maternal grandmother; Diabetes in her father; Heart attack (age of onset: 45) in her mother; Hypertension in her mother. There is no history of Neuropathy.  Social History: neg tobacco, + ETOH  Labs:04/29/2019 TC 206 TG 52 HDL 84 LDL 112 (no therapy)  Past Medical History:  Diagnosis Date  . Anemia   . ARTHRITIS, KNEE 09/17/2007  . Asthma 04/04/2010  . Cataract 01/07/2019  . Closed fracture of distal end of left radius 11/01/2015  . Complete deafness    Meningitis at age 31  . Deaf   . Diabetes mellitus   . Diabetic neuropathy ([place])   . Ganglion cyst 06/22/2011  . Gastroparesis   . GERD (gastroesophageal reflux disease)   . H/O: C-section   . Hyperlipidemia   . Hypertension    . Neuromuscular disorder ([place])    diabetic neuropathy  . PSVT (paroxysmal supraventricular tachycardia) ([place]) 11/09/2017   Event monitor 09/14/2017 - Predominantly sinus rhythm with episodes of narrow-complex tachycardia suggestive of paroxysmal supraventricular tachycardia.  . S/P appy     Current Outpatient Medications on File Prior to Visit  Medication Sig Dispense Refill  . ACCU-CHEK SOFTCLIX LANCETS lancets TEST 4 TIMES A DAY 200 each 0  . acetaminophen (TYLENOL) 500 MG tablet Take 500 mg by mouth every 4 (four) hours as needed.    Marland Kitchen aspirin (BAYER CHILDRENS ASPIRIN) 81 MG chewable tablet Chew 81 mg by mouth daily.      . carvedilol (COREG) 25 MG tablet TAKE 1 TABLET (25 MG TOTAL) BY MOUTH 2 (TWO) TIMES DAILY WITH A MEAL. 180 tablet 3  . cholecalciferol (VITAMIN D) 1000 UNITS tablet Take 1,000 Units by mouth daily.    Marland Kitchen diltiazem (CARDIZEM CD) 180 MG 24 hr capsule Take 1 capsule (180 mg total) by mouth daily. 30 capsule 3  . diltiazem (CARDIZEM CD) 240 MG 24 hr capsule Take 1 capsule (240 mg total) by mouth daily. 30 capsule 0  . diltiazem (CARDIZEM) 30 MG tablet Take 1 tablet (30 mg total) by mouth as needed (Heart racing or palpitations that last longer than 15 mins). 30 tablet  2  . famotidine (PEPCID) 20 MG tablet Take 1 tablet (20 mg total) by mouth 2 (two) times daily. 60 tablet 0  . ferrous sulfate 325 (65 FE) MG tablet Take 325 mg by mouth daily with breakfast.    . fluticasone (FLONASE) 50 MCG/ACT nasal spray SPRAY 2 SPRAYS INTO EACH NOSTRIL EVERY DAY 16 g 3  . gabapentin (NEURONTIN) 400 MG capsule TAKE 3 CAPSULES BY MOUTH TWICE A DAY AS NEEDED FOR PAIN 540 capsule 2  . glucose blood test strip CHECK SUGAR 3 TIMES A DAILY. 100 each 12  . hydrochlorothiazide (HYDRODIURIL) 12.5 MG tablet TAKE 1 TABLET BY MOUTH EVERY DAY 90 tablet 3  . Insulin Glargine (BASAGLAR KWIKPEN) 100 UNIT/ML SOPN INJECT 8 UNITS INTO THE SKIN DAILY. 15 pen 3  . Insulin Pen Needle 31G X 5 MM MISC 1 Container by  Does not apply route once as needed for up to 1 dose. 100 each 11  . liraglutide (VICTOZA) 18 MG/3ML SOPN Inject 0.1 mLs (0.6 mg total) into the skin 1 day or 1 dose for 120 doses. 3 pen 3  . losartan (COZAAR) 100 MG tablet TAKE 1 TABLET BY MOUTH EVERY DAY 90 tablet 3  . meloxicam (MOBIC) 15 MG tablet TAKE 1 TABLET BY MOUTH EVERY DAY 30 tablet 1  . metFORMIN (GLUCOPHAGE) 1000 MG tablet TAKE 1 TABLET BY MOUTH TWICE A DAY 180 tablet 3  . nitroGLYCERIN (NITROSTAT) 0.3 MG SL tablet Place 1 tablet (0.3 mg total) under the tongue every 5 (five) minutes as needed for chest pain. 30 tablet 0  . NOVOLOG 100 UNIT/ML injection INJECT 1-4 UNITS INTO THE SKIN 3 TIMES DAILY WITH MEALS. 30 mL 0  . Olopatadine HCl 0.2 % SOLN PLACE 1 DROP IN BOTH EYES DAILY    . omeprazole (PRILOSEC) 20 MG capsule TAKE 1 CAPSULE BY MOUTH EVERY DAY 90 capsule 1  . senna-docusate (SENOKOT-S) 8.6-50 MG tablet Take 1 tablet by mouth daily as needed for mild constipation or moderate constipation. 30 tablet 0   Current Facility-Administered Medications on File Prior to Visit  Medication Dose Route Frequency Provider Last Rate Last Dose  . fexofenadine (ALLEGRA) tablet 60 mg  60 mg Oral BID Alicia Pike, MD        Allergies  Allergen Reactions  . Sulfonamide Derivatives Swelling and Rash    REACTION: rash, swelling - "Lost BABY" - Terrible itching.   . Lipitor [Atorvastatin Calcium] Other (See Comments)    Muscle Aches - Mild-Moderate - completely resolved with D/C of atorva.   . Ramipril Other (See Comments)    REACTION: cough Unknown reaction  . Trulicity [Dulaglutide] Nausea And Vomiting  . Atorvastatin Other (See Comments)    Unknown reaction  . Sulfamethoxazole Other (See Comments)    Unknown reaction    Assessment/Plan:  1. Hyperlipidemia - Patient LDL is not at goal of <100. Discussed with the patient the cardiovascular benefit from statin therapy. Discussed options of either trying pravastatin, dosing  rosuvastatin 5mg  two times a week, or trying PCKS9i. Patient is willing to try rosuvastatin 5mg  two times a week. Will recheck lipids in 3 months. Advised patient to call or send My Chart message if she experiences muscle pains with rosuvastatin. Continue exercising and try to increase to 4-5 days per week.    Thank you,  Ramond Dial, Pharm.D, BCPS, CPP [place]  [id number], [place], [place] [address]  Phone: [phone]; Fax: [phone]

## 2019-05-15 NOTE — Patient Instructions (Addendum)
It is was a pleasure to meet you today!  Start taking rousvastatin 5mg  two times a week.  Continue exercising. Try to increase from 2 days a week to 4-5 days.   We will repeat lab work on Thursday Feb 4th in the AM  Call us at [phone] with any questions or concerns or send [name] a My Chart Message.

## 2019-05-21 DIAGNOSIS — Z20828 Contact with and (suspected) exposure to other viral communicable diseases: Secondary | ICD-10-CM | POA: Diagnosis not present

## 2019-05-30 ENCOUNTER — Other Ambulatory Visit: Payer: Self-pay | Admitting: Student in an Organized Health Care Education/Training Program

## 2019-06-09 DIAGNOSIS — E113591 Type 2 diabetes mellitus with proliferative diabetic retinopathy without macular edema, right eye: Secondary | ICD-10-CM | POA: Diagnosis not present

## 2019-06-09 DIAGNOSIS — E113512 Type 2 diabetes mellitus with proliferative diabetic retinopathy with macular edema, left eye: Secondary | ICD-10-CM | POA: Diagnosis not present

## 2019-06-09 DIAGNOSIS — H35373 Puckering of macula, bilateral: Secondary | ICD-10-CM | POA: Diagnosis not present

## 2019-06-09 DIAGNOSIS — H35031 Hypertensive retinopathy, right eye: Secondary | ICD-10-CM | POA: Diagnosis not present

## 2019-06-10 ENCOUNTER — Other Ambulatory Visit: Payer: Self-pay | Admitting: Family Medicine

## 2019-06-15 ENCOUNTER — Other Ambulatory Visit: Payer: Self-pay | Admitting: Family Medicine

## 2019-06-15 DIAGNOSIS — M159 Polyosteoarthritis, unspecified: Secondary | ICD-10-CM

## 2019-06-19 ENCOUNTER — Other Ambulatory Visit: Payer: Self-pay | Admitting: Student in an Organized Health Care Education/Training Program

## 2019-06-19 ENCOUNTER — Other Ambulatory Visit: Payer: Self-pay

## 2019-06-19 ENCOUNTER — Ambulatory Visit (INDEPENDENT_AMBULATORY_CARE_PROVIDER_SITE_OTHER): Payer: Medicare Other | Admitting: Family Medicine

## 2019-06-19 ENCOUNTER — Encounter: Payer: Self-pay | Admitting: Family Medicine

## 2019-06-19 DIAGNOSIS — S61011A Laceration without foreign body of right thumb without damage to nail, initial encounter: Secondary | ICD-10-CM

## 2019-06-19 DIAGNOSIS — M79675 Pain in left toe(s): Secondary | ICD-10-CM

## 2019-06-19 DIAGNOSIS — E119 Type 2 diabetes mellitus without complications: Secondary | ICD-10-CM

## 2019-06-19 NOTE — Patient Instructions (Signed)
It was great to see you!  Our plans for today:  - Put petroleum jelly or cream on your finger to keep it moisturized and it should continue to heal normally. - Your toe looks great without signs of infection or trauma. It is likely a sensory nerve irritation. Make sure you wear shoes when walking and socks when you sleep to lessen the irritation. It may get better as your sugars get better. If it is still present after a few weeks or if you notice any skin changes or worsening, come back to see [name].  Take care and seek immediate care sooner if you develop any concerns.   Dr. [facility]

## 2019-06-19 NOTE — Progress Notes (Signed)
  Subjective:   Patient ID: Alicia [name]    DOB: 05/14/1956, 63 y.o. female   MRN: [id number]  Alicia [name] is a 63 y.o. female with a history of PSVT, HTN, OSA, GERD, T2DM, diabetic retinopathy, hypothyroidism, HLD, iron deficiency anemia, seizures, seasonal allergies here for   Cut on finger Cut R thumb on Friday when cooking with scissors. Has used black seed oil per daughter's recommendation to help heal. Feels it is healing some but noticed it has been dry. Denies blood or discharge. CBGs have been up and down lately.  L great toe discomfort Noticing discomfort along outside of L great toe when putting pressure on it x1 week. Reports "weird sensation" and occasional pain with not wearing shoes. Also notes odd sensation when sheets rub against toe. Does not have discomfort when wearing socks or shoes. Denies recent trauma. Cut her toenails 2 days ago. Denies discharge, bleeding, skin or nail changes. Does not follow with podiatry.  Review of Systems:  Per HPI.  Medications and smoking status reviewed.  Objective:   BP 130/60   Pulse 83   Wt 155 lb 2 oz (70.4 kg)   SpO2 97%   BMI 27.48 kg/m  Vitals and nursing note reviewed.  General: well nourished, well developed, in no acute distress with non-toxic appearance Skin: warm, dry. Small well healing cut noted to pad of R thumb without erythema, discharge, swelling, or bleeding. Extremities: warm and well perfused, normal tone. L great toe without swelling, erythema, discharge, nail within normal limits. No tenderness to palpation. Sensation intact to L foot. MSK: ROM grossly intact, gait normal.  Neuro: Alert and oriented, speech normal  Assessment & Plan:   Thumb laceration Well healing without signs of infection though dry on exam. Recommended petroleum jelly to keep area moisturized. RTC if worsens.  Toe pain Unclear etiology. Appears neurologic given sensory triggers with worsening without shoes and with sheets rubbing  area of concern. Pain not reproducible on exam today and toe without obvious abnormalities, sensation intact on exam. Full ROM without h/o trauma, unlikely to have fracture or nerve compromise. Has IDT2DM but last A1c 6.9 04/2019 and unlikely to be 2/2 diabetic neuropathy given asymmetric distribution. Recommended protective footwear when walking and socks with sleep, f/u in 1-2 weeks if no better. F/u with PCP as scheduled for DM.  No orders of the defined types were placed in this encounter.  No orders of the defined types were placed in this encounter.   Rory Percy, DO PGY-3, [facility] 06/20/2019 11:13 AM

## 2019-06-20 DIAGNOSIS — S61019A Laceration without foreign body of unspecified thumb without damage to nail, initial encounter: Secondary | ICD-10-CM | POA: Insufficient documentation

## 2019-06-20 DIAGNOSIS — M79674 Pain in right toe(s): Secondary | ICD-10-CM | POA: Insufficient documentation

## 2019-06-20 NOTE — Assessment & Plan Note (Signed)
Well healing without signs of infection though dry on exam. Recommended petroleum jelly to keep area moisturized. RTC if worsens.

## 2019-06-20 NOTE — Assessment & Plan Note (Signed)
Unclear etiology. Appears neurologic given sensory triggers with worsening without shoes and with sheets rubbing area of concern. Pain not reproducible on exam today and toe without obvious abnormalities, sensation intact on exam. Full ROM without h/o trauma, unlikely to have fracture or nerve compromise. Has IDT2DM but last A1c 6.9 04/2019 and unlikely to be 2/2 diabetic neuropathy given asymmetric distribution. Recommended protective footwear when walking and socks with sleep, f/u in 1-2 weeks if no better. F/u with PCP as scheduled for DM.

## 2019-06-21 ENCOUNTER — Other Ambulatory Visit: Payer: Self-pay | Admitting: Family Medicine

## 2019-06-21 ENCOUNTER — Other Ambulatory Visit: Payer: Self-pay | Admitting: Student in an Organized Health Care Education/Training Program

## 2019-06-28 ENCOUNTER — Other Ambulatory Visit: Payer: Self-pay | Admitting: Family Medicine

## 2019-07-01 ENCOUNTER — Other Ambulatory Visit: Payer: Self-pay

## 2019-07-01 ENCOUNTER — Telehealth (INDEPENDENT_AMBULATORY_CARE_PROVIDER_SITE_OTHER): Payer: Medicare Other | Admitting: Family Medicine

## 2019-07-01 DIAGNOSIS — R432 Parageusia: Secondary | ICD-10-CM

## 2019-07-01 NOTE — Progress Notes (Signed)
[facility] Visit  Patient consented to have virtual visit. Method of visit: Telephone  Encounter participants: Patient: Alicia [name] - located at home Provider: Patriciaann Clan - located at home Others (if applicable): none  Chief Complaint:   HPI: Alicia [name] is a 63 year old female with a history of hypertension, OSA, PSVT, diabetes, hypothyroidism, and seizure-like activity presenting discuss the following:  Sign language telephone interpreter used for the duration of the visit.  Change in taste: Approximate nonprogressive 2-year history with several "sweet "foods tasting awful.  She mainly notices it with "zero-calorie " things or with some fresh fruits.  Otherwise, she has no difficulty with taste and other food groups are fine.  It is bothersome to her that something would leave this lingering taste in her mouth, but seems to be managing it well.  Does not have a bad or metallic taste in her mouth at baseline, only after eating the certain foods with artificial sweeteners.  No previous history of stroke.  Does have a history of GERD and is currently on treatment for this.  She has not been to the dentist in several years.  Denies any sensation of dry mouth.  Unfortunately, due to miscommunications with the patient it was not until the end of our conversation that she mentioned this evaluation of her change in taste was made to rule out concerns for Covid to have her be seen in the clinic due to knee pain.   ROS: per HPI  Pertinent PMHx: See above  Exam:  Respiratory: Spoke with sign language interpreter only.  Assessment/Plan:  Dysgeusia Chronic. Unclear etiology, may be a part of normal aging process.  Recommended evaluation by her dentist in the near future to rule out any periodontal disease contributing.  Not on any anticholinergic medications, however could be a possible medication side effect.  Low concern for a neurological  process, or liver/kidney manifestation.  Her chronic change in taste is most definitely not concerning for Covid, she can be cleared to be seen in the clinic for her knee pain provided she is otherwise asymptomatic.    Time spent during visit with patient: 15 minutes  Patriciaann Clan, DO

## 2019-07-03 DIAGNOSIS — R432 Parageusia: Secondary | ICD-10-CM | POA: Insufficient documentation

## 2019-07-03 NOTE — Assessment & Plan Note (Signed)
Chronic. Unclear etiology, may be a part of normal aging process.  Recommended evaluation by her dentist in the near future to rule out any periodontal disease contributing.  Not on any anticholinergic medications, however could be a possible medication side effect.  Low concern for a neurological process, or liver/kidney manifestation.  Her chronic change in taste is most definitely not concerning for Covid, she can be cleared to be seen in the clinic for her knee pain provided she is otherwise asymptomatic.

## 2019-07-07 ENCOUNTER — Other Ambulatory Visit: Payer: Self-pay | Admitting: *Deleted

## 2019-07-07 DIAGNOSIS — E119 Type 2 diabetes mellitus without complications: Secondary | ICD-10-CM

## 2019-07-07 DIAGNOSIS — Z794 Long term (current) use of insulin: Secondary | ICD-10-CM

## 2019-07-07 MED ORDER — VICTOZA 18 MG/3ML ~~LOC~~ SOPN: 0.6000 mg | PEN_INJECTOR | SUBCUTANEOUS | 3 refills | Status: DC

## 2019-07-07 NOTE — Telephone Encounter (Signed)
Patient states that she has been out of [place] for over a week and that she was told by the pharmacy that it wasn't covered.  I advised patient that we would try sending a new script to the pharmacy since we haven't sent one since July.  Patient voiced understanding and we will update her if the insurance comes back requiring PA on it.  Ivy Meriwether,CMA

## 2019-07-09 ENCOUNTER — Other Ambulatory Visit: Payer: Self-pay

## 2019-07-09 ENCOUNTER — Ambulatory Visit (INDEPENDENT_AMBULATORY_CARE_PROVIDER_SITE_OTHER): Payer: Medicare Other | Admitting: Family Medicine

## 2019-07-09 ENCOUNTER — Encounter: Payer: Self-pay | Admitting: Family Medicine

## 2019-07-09 VITALS — BP 130/70 | HR 86 | Wt 156.2 lb

## 2019-07-09 DIAGNOSIS — M791 Myalgia, unspecified site: Secondary | ICD-10-CM | POA: Diagnosis not present

## 2019-07-09 NOTE — Patient Instructions (Addendum)
It was great to see you!  Our plans for today:  - We are checking some labs today, we will call you or send you a letter with these results. - Make sure you exercise enough to get your heart rate up at least 3 times per week. Youtube has great videos, check out BodyFit by Amy.  - Continue to use heating pad with tylenol and ibuprofen for pain relief. - You can resume your cholesterol medication as this is not likely the cause of your pain.  - Come back in 2-3 weeks if no better.   Take care and seek immediate care sooner if you develop any concerns.   Dr. [facility]

## 2019-07-09 NOTE — Progress Notes (Signed)
  Subjective:   Patient ID: Alicia [name]    DOB: [birth year], 64 y.o. female   MRN: [id number]  Alicia [name] is a 64 y.o. female with a history of PSVT, HTN, OSA, GERD, T2DM with retinopathy, hyperthyroidism, OA, HLD, iron deficiency anemia, seizure here for   Leg Pain Endorses bilateral inner thigh pain that radiates down the inner portions of her leg for the past few months.  She initially thought it was due to starting a new cholesterol medication but reports she is still having pain after stopping statin 2 weeks ago.  Pain is described as soreness, achiness and throbbing.  She denies any prior trauma.  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  Takes Tylenol and has tried a heating pad with some relief but then pain returns.  Denies any loss of sensation of her legs or loss of bowel or bladder control.  Denies any recent long distance travel or history of blood clots.  Denies redness, swelling, fever, weakness, weight loss.  Also reports she will occasionally have pain in her upper arms with movement as well.  Review of Systems:  Per HPI.  Medications and smoking status reviewed.  Objective:   BP 130/70   Pulse 86   Wt 156 lb 3.2 oz (70.9 kg)   SpO2 97%   BMI 27.67 kg/m  Vitals and nursing note reviewed.  General: Overweight female, in no acute distress with non-toxic appearance Skin: warm, dry, no rashes or lesions Extremities: warm and well perfused, normal tone MSK: AROM of upper and lower extremities grossly intact, gait normal.  Tenderness to palpation of proximal muscles of upper and lower extremities.  No swelling, redness, rashes appreciated on exam.  5/5 strength in U/LE bilaterally. Neuro: Alert and oriented, speech normal  Assessment & Plan:   Muscle pain Noted mainly of proximal inner thigh muscles of bilateral legs as well as proximal muscles of upper extremities bilaterally,  ongoing for some months with some relief with OTC pain control.  Exam notable only for tenderness to palpation of proximal muscles with intact strength and sensation and no skin findings.  Unlikely to be caused by statin as pain persisted despite discontinuation of statin, recommended patient continuing.  Full range of motion with no history of trauma, doubt fracture, dislocation, sprain.  Exam and history not consistent with blood clot, infection.  Given proximal muscle pain will evaluate for autoimmune etiology, though not likely given lack of personal or family history.  Will obtain CRP, ESR, CMP, ANA, CK to more fully evaluate.  Most likely patient has fibromyalgia given tenderness on exam and full strength though does not have psychiatric comorbidity do not required for diagnosis.  Recommended regular exercise and stretching with OTC pain relief.  Return precautions reviewed, see AVS for details.  Orders Placed This Encounter  Procedures  . CMP (comprehensive metabolic panel)    Order Specific Question:   Has the patient fasted?    Answer:   No  . Sedimentation Rate  . ANA  . CK  . C-reactive protein   No orders of the defined types were placed in this encounter.   Rory Percy, DO PGY-3, [facility] 07/10/2019 10:46 AM

## 2019-07-10 ENCOUNTER — Other Ambulatory Visit: Payer: Self-pay | Admitting: Student in an Organized Health Care Education/Training Program

## 2019-07-10 ENCOUNTER — Other Ambulatory Visit: Payer: Self-pay | Admitting: Physician Assistant

## 2019-07-10 DIAGNOSIS — Z794 Long term (current) use of insulin: Secondary | ICD-10-CM

## 2019-07-10 DIAGNOSIS — E119 Type 2 diabetes mellitus without complications: Secondary | ICD-10-CM

## 2019-07-10 LAB — COMPREHENSIVE METABOLIC PANEL
ALT: 14 IU/L (ref 0–32)
AST: 12 IU/L (ref 0–40)
Albumin/Globulin Ratio: 1.7 (ref 1.2–2.2)
Albumin: 4.3 g/dL (ref 3.8–4.8)
Alkaline Phosphatase: 88 IU/L (ref 39–117)
BUN/Creatinine Ratio: 25 (ref 12–28)
BUN: 22 mg/dL (ref 8–27)
Bilirubin Total: <0.2 mg/dL (ref 0.0–1.2)
CO2: 24 mmol/L (ref 20–29)
Calcium: 10.5 mg/dL — ABNORMAL HIGH (ref 8.7–10.3)
Chloride: 103 mmol/L (ref 96–106)
Creatinine, Ser: 0.87 mg/dL (ref 0.57–1.00)
GFR calc Af Amer: 82 mL/min/{1.73_m2} (ref >59)
GFR calc non Af Amer: 71 mL/min/{1.73_m2} (ref >59)
Globulin, Total: 2.6 g/dL (ref 1.5–4.5)
Glucose: 152 mg/dL — ABNORMAL HIGH (ref 65–99)
Potassium: 3.9 mmol/L (ref 3.5–5.2)
Sodium: 140 mmol/L (ref 134–144)
Total Protein: 6.9 g/dL (ref 6.0–8.5)

## 2019-07-10 LAB — C-REACTIVE PROTEIN: CRP: 13 mg/L — ABNORMAL HIGH (ref 0–10)

## 2019-07-10 LAB — CK: Total CK: 101 U/L (ref 32–182)

## 2019-07-10 LAB — ANA: Anti Nuclear Antibody (ANA): NEGATIVE

## 2019-07-10 LAB — SEDIMENTATION RATE: Sed Rate: 34 mm/hr (ref 0–40)

## 2019-07-10 NOTE — Assessment & Plan Note (Signed)
Noted mainly of proximal inner thigh muscles of bilateral legs as well as proximal muscles of upper extremities bilaterally, ongoing for some months with some relief with OTC pain control.  Exam notable only for tenderness to palpation of proximal muscles with intact strength and sensation and no skin findings.  Unlikely to be caused by statin as pain persisted despite discontinuation of statin, recommended patient continuing.  Full range of motion with no history of trauma, doubt fracture, dislocation, sprain.  Exam and history not consistent with blood clot, infection.  Given proximal muscle pain will evaluate for autoimmune etiology, though not likely given lack of personal or family history.  Will obtain CRP, ESR, CMP, ANA, CK to more fully evaluate.  Most likely patient has fibromyalgia given tenderness on exam and full strength though does not have psychiatric comorbidity do not required for diagnosis.  Recommended regular exercise and stretching with OTC pain relief.  Return precautions reviewed, see AVS for details.

## 2019-07-15 ENCOUNTER — Other Ambulatory Visit: Payer: Self-pay | Admitting: Student in an Organized Health Care Education/Training Program

## 2019-07-15 DIAGNOSIS — I10 Essential (primary) hypertension: Secondary | ICD-10-CM

## 2019-07-30 ENCOUNTER — Other Ambulatory Visit: Payer: Self-pay

## 2019-07-30 ENCOUNTER — Ambulatory Visit (INDEPENDENT_AMBULATORY_CARE_PROVIDER_SITE_OTHER): Payer: Medicare Other | Admitting: Family Medicine

## 2019-07-30 ENCOUNTER — Encounter: Payer: Self-pay | Admitting: Family Medicine

## 2019-07-30 VITALS — BP 138/68 | HR 73 | Wt 156.0 lb

## 2019-07-30 DIAGNOSIS — M7052 Other bursitis of knee, left knee: Secondary | ICD-10-CM | POA: Diagnosis not present

## 2019-07-30 DIAGNOSIS — M7051 Other bursitis of knee, right knee: Secondary | ICD-10-CM

## 2019-07-30 MED ORDER — DICLOFENAC SODIUM 1 % EX GEL: 2.0000 g | Freq: Three times a day (TID) | CUTANEOUS | 0 refills | Status: DC

## 2019-07-30 NOTE — Patient Instructions (Addendum)
Hi Ms Queener,  It was lovely to meet you today! I am sorry your knees have been hurting so much. I think you have bursitis in your knees which is called Pes Anserine bursitis.  Please apply the Voltaren gel to the affected area 3 times a day.  You can also apply ice an please buy a compression device to help support the knees.  I have also referred you to physical therapy which may help you feel more steady on your legs when walking.  Please come and see me in a few weeks time if the symptoms have not improved with the gel.    Best wishes,  Dr Posey Pronto

## 2019-08-02 DIAGNOSIS — M7051 Other bursitis of knee, right knee: Secondary | ICD-10-CM | POA: Insufficient documentation

## 2019-08-02 NOTE — Progress Notes (Signed)
   Subjective:    Patient ID: ASTI FAVREAU, female    DOB: [birth year], 64 y.o.   MRN: [id number]  In-person sign language interpretor was used for the entirety of this visit  CC: Alicia [name] is a 64 yr old female who presents today with bilateral knee pain   HPI:  Bilateral knee pain 1 month hx of gradual onset, "burning" bilateral knee pain. Pt pointed to medial aspect of knee joint. Pain radiates down legs and up to hips. 4/10 severity. 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 Has tried ice and tylenol which has not helped. Associated with minor joint swelling. When pt gets up she sometimes feels unsteady and feels she might fall. Denies fevers, recent trauma to knees or falls.  Denies other joint pains. Denies previous surgeries on knee.  Smoking status reviewed   ROS: pertinent noted in the HPI   Past medical history, surgical, family, and social history reviewed and updated in the EMR as appropriate. Reviewed problem list.   Objective:  BP 138/68   Pulse 73   Wt 156 lb (70.8 kg)   SpO2 98%   BMI 27.63 kg/m   Vitals and nursing note reviewed  General: NAD, pleasant, able to participate in exam MSK: no obvious edema or deformities of knees bilaterally, no erythema of overlying skin, skin is not warm to touch, tenderness on medial aspect of joint bilaterally, normal passive and active ROM of knee joint. No pain on palpation of greater trochanters 5/5 strength and normal sensation of LE  Assessment & Plan:    Pes anserinus bursitis of both knees Considered Pes Anserius as top differential given specific location of joint pain medially bilaterally and inflammatory nature pain. Low suspicion for septic arthritis as would expect acute onset, hot and edematous joint. Also considered OA but pain may be more generalized with crepitus on exam. -Recommended rest, ice, NSAIDs and Tylenol -To apply diclofenac gel to knees  BID -Amb referral to physiotherapy -Follow up with PCP if symptoms persist   Lattie Haw, MD  [facility] PGY-1

## 2019-08-02 NOTE — Assessment & Plan Note (Signed)
Considered Pes Anserius as top differential given specific location of joint pain medially bilaterally and inflammatory nature pain. Low suspicion for septic arthritis as would expect acute onset, hot and edematous joint. Also considered OA but pain may be more generalized with crepitus on exam. -Recommended rest, ice, NSAIDs and Tylenol -To apply diclofenac gel to knees BID -Amb referral to physiotherapy -Follow up with PCP if symptoms persist

## 2019-08-07 ENCOUNTER — Other Ambulatory Visit: Payer: Self-pay

## 2019-08-07 ENCOUNTER — Other Ambulatory Visit: Payer: Medicare Other | Admitting: *Deleted

## 2019-08-07 DIAGNOSIS — E7800 Pure hypercholesterolemia, unspecified: Secondary | ICD-10-CM | POA: Diagnosis not present

## 2019-08-07 LAB — LIPID PANEL
Chol/HDL Ratio: 2 ratio (ref 0.0–4.4)
Cholesterol, Total: 176 mg/dL (ref 100–199)
HDL: 88 mg/dL (ref >39)
LDL Chol Calc (NIH): 78 mg/dL (ref 0–99)
Triglycerides: 49 mg/dL (ref 0–149)
VLDL Cholesterol Cal: 10 mg/dL (ref 5–40)

## 2019-08-11 ENCOUNTER — Other Ambulatory Visit: Payer: Self-pay | Admitting: Family Medicine

## 2019-08-11 DIAGNOSIS — M159 Polyosteoarthritis, unspecified: Secondary | ICD-10-CM

## 2019-08-12 ENCOUNTER — Ambulatory Visit: Payer: Medicare Other | Admitting: Cardiology

## 2019-08-12 NOTE — Progress Notes (Signed)
Cardiology Office Note Date:  08/14/2019  Patient ID:  Alicia [name] 1956-06-14, MRN [id number] PCP:  Stark Klein, MD  Cardiologist:  Dr. Saunders Revel Electrophysiologist: Dr. Curt Bears   Today's visit is held today using sign language interpretor Juliann Pulse w/[facility]    Chief Complaint: Planned follow up  History of Present Illness: Alicia [name] is a 64 y.o. female with history of HTN, HLD, SVT, DM, deafness, OSA, CP felt to be atypical with neg stress testing  She comes in today to be seen for Dr. Curt Bears.  Referred to him Nov 2020 for palpitations and SVT noted on a monitor.  At that time was still waiting to receive CPAP for her sleep apnea.  She was still having palpitations despite coreg.  Discussed ablation though the pt preferred ongoing medical management first and daily dilt was added.  She had an ER visit in Nov 2020 with CP, nausea, felt to be GI ultimately and her prilosec increased. HS Trop 3 > 4  She feels well from a cardiac perspective, no CP, palpitations.  She is limited by b/l knee and back pain, and has been struggling with GI issues.   She has days that she gets so bloated , is very uncomfortable, her clothes are ting, associated with nausea and sometimes vomiting as well.  Mostly triggers by eating, and can be improved with Mylanta, sometimes a laxative will help with BM. She was to have been referred to GI via her PMD office though has been a few weeks and not hear back yet.  She is due to see her again in a week or two.  Says at their last visit her knees were hurting her so bad that they concentrated on that.  No palpitations, tachycardias, no dizzy spells, no near syncope or syncope.  She has not yet been able to get around to following up on her CPAP machine.  Says she just has som much to take care of, work, home, so on.    Past Medical History:  Diagnosis Date  . Anemia   . ARTHRITIS, KNEE 09/17/2007  . Asthma 04/04/2010  . Cataract 01/07/2019  .  Closed fracture of distal end of left radius 11/01/2015  . Complete deafness    Meningitis at age 18  . Deaf   . Diabetes mellitus   . Diabetic neuropathy ([place])   . Ganglion cyst 06/22/2011  . Gastroparesis   . GERD (gastroesophageal reflux disease)   . H/O: C-section   . Hyperlipidemia   . Hypertension   . Neuromuscular disorder ([place])    diabetic neuropathy  . PSVT (paroxysmal supraventricular tachycardia) ([place]) 11/09/2017   Event monitor 09/14/2017 - Predominantly sinus rhythm with episodes of narrow-complex tachycardia suggestive of paroxysmal supraventricular tachycardia.  . S/P appy     Past Surgical History:  Procedure Laterality Date  . APPENDECTOMY    . cardiolyte EF 77%, no ischemia in 2006  2006  . CARPAL TUNNEL RELEASE Right 04/20/2015   Procedure: RIGHT CARPAL TUNNEL RELEASE;  Surgeon: Daryll Brod, MD;  Location: [facility];  Service: Orthopedics;  Laterality: Right;  . CARPAL TUNNEL RELEASE Left 11/04/2015   Procedure: CARPAL TUNNEL RELEASE;  Surgeon: Daryll Brod, MD;  Location: [facility];  Service: Orthopedics;  Laterality: Left;  . CESAREAN SECTION    . OPEN REDUCTION INTERNAL FIXATION (ORIF) DISTAL RADIAL FRACTURE Left 11/04/2015   Procedure: OPEN REDUCTION INTERNAL FIXATION (ORIF) LEFT DISTAL RADIAL FRACTURE POSSIBLE BONE GRAFT;  Surgeon:  Daryll Brod, MD;  Location: [facility];  Service: Orthopedics;  Laterality: Left;  . TRIGGER FINGER RELEASE Right 04/20/2015   Procedure: RELEASE TRIGGER FINGER/A-1 PULLEY RIGHT MIDDLE FINGER,RIGHT RING FINGER;  Surgeon: Daryll Brod, MD;  Location: [facility];  Service: Orthopedics;  Laterality: Right;  . TUBAL LIGATION    . ULNAR NERVE TRANSPOSITION Right 04/20/2015   Procedure: RIGHT ULNAR NERVE DECOMPRESSION;  Surgeon: Daryll Brod, MD;  Location: [facility];  Service: Orthopedics;  Laterality: Right;  . UTERINE FIBROID EMBOLIZATION  2007  . WRIST SURGERY     Cyst  removed on left  . WRIST SURGERY Right 1985   tendon repair R wrist    Current Outpatient Medications  Medication Sig Dispense Refill  . ACCU-CHEK SOFTCLIX LANCETS lancets TEST 4 TIMES A DAY 200 each 0  . acetaminophen (TYLENOL) 500 MG tablet Take 500 mg by mouth every 4 (four) hours as needed.    Marland Kitchen aspirin (BAYER CHILDRENS ASPIRIN) 81 MG chewable tablet Chew 81 mg by mouth daily.      . carvedilol (COREG) 25 MG tablet TAKE 1 TABLET (25 MG TOTAL) BY MOUTH 2 (TWO) TIMES DAILY WITH A MEAL. 180 tablet 3  . cholecalciferol (VITAMIN D) 1000 UNITS tablet Take 1,000 Units by mouth daily.    . diclofenac Sodium (VOLTAREN) 1 % GEL Apply 2 g topically 3 (three) times daily. 100 g 0  . diltiazem (CARDIZEM CD) 240 MG 24 hr capsule TAKE 1 CAPSULE BY MOUTH EVERY DAY 30 capsule 2  . diltiazem (CARDIZEM) 30 MG tablet TAKE 1 TABLET BY MOUTH AS NEEDED (HEART RACING OR PALPITATIONS THAT LAST LONGER THAN 15 MINS). 90 tablet 3  . famotidine (PEPCID) 20 MG tablet TAKE 1 TABLET BY MOUTH TWICE A DAY 60 tablet 2  . ferrous sulfate 325 (65 FE) MG tablet Take 325 mg by mouth daily with breakfast.    . fluticasone (FLONASE) 50 MCG/ACT nasal spray SPRAY 2 SPRAYS INTO EACH NOSTRIL EVERY DAY 16 g 3  . gabapentin (NEURONTIN) 400 MG capsule TAKE 3 CAPSULES BY MOUTH TWICE A DAY AS NEEDED FOR PAIN 540 capsule 2  . glucose blood test strip CHECK SUGAR 3 TIMES A DAILY. 100 each 12  . hydrochlorothiazide (HYDRODIURIL) 12.5 MG tablet TAKE 1 TABLET BY MOUTH EVERY DAY 90 tablet 3  . Insulin Glargine (BASAGLAR KWIKPEN) 100 UNIT/ML SOPN INJECT 8 UNITS INTO THE SKIN DAILY. 15 pen 3  . Insulin Pen Needle 31G X 5 MM MISC 1 Container by Does not apply route once as needed for up to 1 dose. 100 each 11  . liraglutide (VICTOZA) 18 MG/3ML SOPN Inject 0.1 mLs (0.6 mg total) into the skin 1 day or 1 dose for 120 doses. 3 pen 3  . losartan (COZAAR) 100 MG tablet TAKE 1 TABLET BY MOUTH EVERY DAY 90 tablet 3  . meloxicam (MOBIC) 15 MG tablet  TAKE 1 TABLET BY MOUTH EVERY DAY 30 tablet 1  . metFORMIN (GLUCOPHAGE) 1000 MG tablet TAKE 1 TABLET BY MOUTH TWICE A DAY 180 tablet 0  . nitroGLYCERIN (NITROSTAT) 0.3 MG SL tablet Place 1 tablet (0.3 mg total) under the tongue every 5 (five) minutes as needed for chest pain. 30 tablet 0  . NOVOLOG 100 UNIT/ML injection INJECT 1-4 UNITS INTO THE SKIN 3 TIMES DAILY WITH MEALS. 30 mL 0  . Olopatadine HCl 0.2 % SOLN PLACE 1 DROP IN BOTH EYES DAILY    . omeprazole (PRILOSEC) 20 MG  capsule TAKE 1 CAPSULE BY MOUTH EVERY DAY (Patient taking differently: 2 (two) times daily before a meal. TAKE 1 CAPSULE BY MOUTH EVERY DAY) 90 capsule 1  . rosuvastatin (CRESTOR) 5 MG tablet Take 1 tablet (5 mg total) by mouth 2 (two) times a week. 26 tablet 3  . senna-docusate (SENOKOT-S) 8.6-50 MG tablet Take 1 tablet by mouth daily as needed for mild constipation or moderate constipation. 30 tablet 0   Current Facility-Administered Medications  Medication Dose Route Frequency Provider Last Rate Last Admin  . fexofenadine (ALLEGRA) tablet 60 mg  60 mg Oral BID Benay Pike, MD        Allergies:   Sulfonamide derivatives, Lipitor [atorvastatin calcium], Ramipril, Trulicity [dulaglutide], Atorvastatin, and Sulfamethoxazole   Social History:  The patient  reports that she has never smoked. She has never used smokeless tobacco. She reports current alcohol use of about 3.0 standard drinks of alcohol per week. She reports that she does not use drugs.   Family History:  The patient's family history includes Cancer in her maternal aunt and maternal grandmother; Diabetes in her father; Heart attack (age of onset: 40) in her mother; Hypertension in her mother.  ROS:  Please see the history of present illness.  All other systems are reviewed and otherwise negative.   PHYSICAL EXAM:  VS:  BP 134/66   Pulse 90   Ht 5\' 3"  (1.6 m)   Wt 157 lb (71.2 kg)   SpO2 97%   BMI 27.81 kg/m  BMI: Body mass index is 27.81 kg/m. Well  nourished, well developed, in no acute distress  HEENT: normocephalic, atraumatic  Neck: no JVD, carotid bruits or masses Cardiac: RRR; no significant murmurs, no rubs, or gallops Lungs:  CTA b/l, no wheezing, rhonchi or rales  Abd: soft, nontender, obese MS: no deformity or atrophy Ext: no edema  Skin: warm and dry, no rash Neuro:  No gross deficits appreciated Psych: euthymic mood, full affect    EKG:  Not done today Reviewed 05/08/2019 SR 7bpm, no ST/T changes, 05/09/2019 SR 79, some artifact, no ST/T changes   Lexiscan Myoview 09/14/2017:  Nuclear stress EF: 61%.  The study is normal.  The left ventricular ejection fraction is normal (55-65%).  1. EF 61%, normal wall motion.  2. No evidence for ischemia or infarction by perfusion images.   Normal study.  _______________  Cardiac Monitor  09/14/2017 to 09/27/2017:  The patient was monitored for 14 days.  The predominant rhythm was sinus; average heart rate during triggered events was 94 bpm.  Two episodes of narrow complex tachycardia were observed, suggestive of SVT.  No ventricular arrhythmias or prolonged pauses were seen.  Patient triggered events corresponding to rapid heart rate/palpitations/flutters correspond to sinus rhythm and narrow-complex tachycardia.  Predominantly sinus rhythm with episodes of narrow-complex tachycardia suggestive of paroxysmal supraventricular tachycardia.   10/14/2002: TTE SUMMARY  - Overall left ventricular systolic function was normal. Left     ventricular ejection fraction was estimated , range being 55     % to 65 %. There were no left ventricular regional wall     motion abnormalities.  - Mean transmitral gradient was 3.1 mmHg. Mitral valve area by     pressure half-time was 3.57 cm^2.  - Left atrial size was at the upper limits of normal.  IMPRESSIONS  - No likely source of an audible heart murmur was apparent.   Recent Labs: 03/07/2019: TSH  1.640 05/09/2019: Hemoglobin 11.4; Platelets 372 07/09/2019: ALT  14; BUN 22; Creatinine, Ser 0.87; Potassium 3.9; Sodium 140  08/07/2019: Chol/HDL Ratio 2.0; Cholesterol, Total 176; HDL 88; LDL Chol Calc (NIH) 78; Triglycerides 49   CrCl cannot be calculated (Patient's most recent lab result is older than the maximum 21 days allowed.).   Wt Readings from Last 3 Encounters:  08/14/19 157 lb (71.2 kg)  07/30/19 156 lb (70.8 kg)  07/09/19 156 lb 3.2 oz (70.9 kg)     Other studies reviewed: Additional studies/records reviewed today include: summarized above  ASSESSMENT AND PLAN:  1. SVT     No ongoing palpitations  2. HTN     Looks OK, no changes today  3. GI symptoms     Symptoms very much sound of GI etiology, not cardiac     She is encouraged to reach out to her PMD office referrals to follow on on her GI referral    Disposition: F/u with [name] in 1 year, sooner if needed   Current medicines are reviewed at length with the patient today.  The patient did not have any concerns regarding medicines.  Venetia Night, PA-C 08/14/2019 8:35 AM     [address] [place] [address] [phone] (office)  [phone] (fax)

## 2019-08-14 ENCOUNTER — Other Ambulatory Visit: Payer: Self-pay

## 2019-08-14 ENCOUNTER — Ambulatory Visit (INDEPENDENT_AMBULATORY_CARE_PROVIDER_SITE_OTHER): Payer: Medicare Other | Admitting: Physician Assistant

## 2019-08-14 VITALS — BP 134/66 | HR 90 | Ht 63.0 in | Wt 157.0 lb

## 2019-08-14 DIAGNOSIS — I471 Supraventricular tachycardia: Secondary | ICD-10-CM | POA: Diagnosis not present

## 2019-08-14 DIAGNOSIS — I10 Essential (primary) hypertension: Secondary | ICD-10-CM

## 2019-08-14 NOTE — Patient Instructions (Signed)
Medication Instructions:   Your physician recommends that you continue on your current medications as directed. Please refer to the Current Medication list given to you today.  *If you need a refill on your cardiac medications before your next appointment, please call your pharmacy*  Lab Work: [place]   If you have labs (blood work) drawn today and your tests are completely normal, you will receive your results only by: Marland Kitchen MyChart Message (if you have MyChart) OR . A paper copy in the mail If you have any lab test that is abnormal or we need to change your treatment, we will call you to review the results.  Testing/Procedures: NONE ORDERED  TODAY    Follow-Up: At [facility], you and your health needs are our priority.  As part of our continuing mission to provide you with exceptional heart care, we have created designated Provider Care Teams.  These Care Teams include your primary Cardiologist (physician) and Advanced Practice Providers (APPs -  Physician Assistants and Nurse Practitioners) who all work together to provide you with the care you need, when you need it.  Your next appointment:   1 year(s)  The format for your next appointment:   In Person  Provider:   You may see Dr. Curt Bears  or one of the following Advanced Practice Providers on your designated Care Team:    Chanetta Marshall, NP  Tommye Standard, PA-C  Legrand Como "Oda Kilts, [place]   Other Instructions

## 2019-08-15 ENCOUNTER — Ambulatory Visit: Payer: Medicare Other | Admitting: Family Medicine

## 2019-08-19 ENCOUNTER — Other Ambulatory Visit: Payer: Self-pay | Admitting: Student in an Organized Health Care Education/Training Program

## 2019-08-19 ENCOUNTER — Ambulatory Visit: Payer: Medicare Other | Admitting: Family Medicine

## 2019-08-19 DIAGNOSIS — M792 Neuralgia and neuritis, unspecified: Secondary | ICD-10-CM

## 2019-08-20 ENCOUNTER — Other Ambulatory Visit: Payer: Self-pay | Admitting: Family Medicine

## 2019-08-21 ENCOUNTER — Ambulatory Visit: Payer: Medicare Other | Admitting: Family Medicine

## 2019-08-24 ENCOUNTER — Other Ambulatory Visit: Payer: Self-pay | Admitting: Family Medicine

## 2019-08-24 DIAGNOSIS — Z794 Long term (current) use of insulin: Secondary | ICD-10-CM

## 2019-08-24 DIAGNOSIS — E118 Type 2 diabetes mellitus with unspecified complications: Secondary | ICD-10-CM

## 2019-08-25 ENCOUNTER — Telehealth: Payer: Self-pay | Admitting: *Deleted

## 2019-08-25 NOTE — Telephone Encounter (Signed)
LM using sign language service asking pt to contact us to go over screening questions prior to visit.Johnhenry Tippin Zimmerman Rumple, CMA

## 2019-08-26 ENCOUNTER — Ambulatory Visit (INDEPENDENT_AMBULATORY_CARE_PROVIDER_SITE_OTHER): Payer: Medicare Other | Admitting: Family Medicine

## 2019-08-26 ENCOUNTER — Other Ambulatory Visit: Payer: Self-pay

## 2019-08-26 ENCOUNTER — Encounter: Payer: Self-pay | Admitting: Family Medicine

## 2019-08-26 VITALS — BP 130/60 | HR 79 | Wt 156.5 lb

## 2019-08-26 DIAGNOSIS — E11311 Type 2 diabetes mellitus with unspecified diabetic retinopathy with macular edema: Secondary | ICD-10-CM | POA: Diagnosis not present

## 2019-08-26 DIAGNOSIS — M79674 Pain in right toe(s): Secondary | ICD-10-CM | POA: Diagnosis not present

## 2019-08-26 LAB — POCT GLYCOSYLATED HEMOGLOBIN (HGB A1C): HbA1c, POC (controlled diabetic range): 7.6 % — AB (ref 0.0–7.0)

## 2019-08-26 MED ORDER — COLCHICINE 0.6 MG PO TABS: ORAL_TABLET | ORAL | 0 refills | Status: DC

## 2019-08-26 NOTE — Progress Notes (Signed)
    SUBJECTIVE:   CHIEF COMPLAINT / HPI:   Right great toe pain Patient with history of right great toe pain.  Has been seen in the office few times now for this issue.  No improvement with OTC medications or improved footwear.  Pain is intermittent.  It seems to occur when patient is walking in flip-flops.  Described as an electrical shock, sudden onset pain.  Patient seems to be unable to trigger the pain when palpating her moving her foot but it seems to be related to torquing the joint.  Denies any known trauma to the area.  Patient endorses having a history of gout from "many years ago".    DMT2 stable . Symptoms: none  . Metformin, Liraglutide (Victoza), Lantus : 8 unit  . Statin therapy: Rosuvastatin  5 mg . BP Goal: < 140/ < 90. At goal? Yes   . No ACE inhibior due to allergy HgA1C Trend:  Lab Results  Component Value Date   HGBA1C 7.6 (A) 08/26/2019   HGBA1C 6.9 04/17/2019   HGBA1C 7.0 01/07/2019   Renal Function Labs:  Lab Results  Component Value Date   LDLCALC 78 08/07/2019   CREATININE 0.87 07/09/2019   The 10-year ASCVD risk score Mikey Bussing DC Jr., et al., 2013) is: 14.6% Diabetes Health Maintenance Due  Topic Date Due  . FOOT EXAM  01/07/2020  . OPHTHALMOLOGY EXAM  01/07/2020  . HEMOGLOBIN A1C  02/23/2020     PERTINENT  PMH / PSH:  Diabetes mellitus type 2  OBJECTIVE:   BP 130/60   Pulse 79   Wt 156 lb 8 oz (71 kg)   SpO2 99%   BMI 27.72 kg/m   Gen: no acute distress MSK: Right great toe, not swollen, no erythema or swelling, nontender to palpation, Full ROM, DP 2+, Full ROM of ankle, 5/5 strength in dorsi/plantar flexion, fine touch sensation intact Left foot: wnl   ASSESSMENT/PLAN:   Type 2 diabetes mellitus with ophthalmic complication (HCC) Well-controlled on current regimen.  No changes at this time.  Hypercalcemia Asymptomatic.  Mild found incidentally on last CMP.  Likely drug-induced from HCTZ. -Parathyroid hormone and repeat  calcium  Great toe pain, right History of gout, chronic flare?Marland Kitchen  Although unimpressive on exam.  Does not seem to be neuropathic -Uric acid level -Trial colchicine -X-ray to rule out occult fracture -Referral to podiatry for ongoing chronic needs    Bonnita Hollow, MD [place]

## 2019-08-26 NOTE — Patient Instructions (Addendum)
It was a pleasure to see you today! Thank you for choosing [facility] for your primary care. Alicia [name] was seen for right toe pain.   For your toe,   1. We are going to get blood work to check for gout.  2. We are starting a gout medication.  3. We are getting a toe xray at  [facility]: [place], [place], [place] [address] 4. We are referring you to Podiatry, if this does not improve your foot pain.  For your high calcium,  1. We are rechecking your calcium levels and parathyroid levels.   Best,  Marny Lowenstein, MD, MS FAMILY MEDICINE RESIDENT - PGY3 08/26/2019 9:36 AM

## 2019-08-26 NOTE — Progress Notes (Signed)
a 

## 2019-08-26 NOTE — Assessment & Plan Note (Signed)
History of gout, chronic flare?Alicia [name]  Although unimpressive on exam.  Does not seem to be neuropathic -Uric acid level -Trial colchicine -X-ray to rule out occult fracture -Referral to podiatry for ongoing chronic needs

## 2019-08-26 NOTE — Assessment & Plan Note (Signed)
Well-controlled on current regimen.  No changes at this time.

## 2019-08-26 NOTE — Assessment & Plan Note (Signed)
Asymptomatic.  Mild found incidentally on last CMP.  Likely drug-induced from HCTZ. -Parathyroid hormone and repeat calcium

## 2019-08-27 ENCOUNTER — Other Ambulatory Visit: Payer: Self-pay | Admitting: Physician Assistant

## 2019-08-27 ENCOUNTER — Other Ambulatory Visit (HOSPITAL_COMMUNITY): Payer: Self-pay | Admitting: Physician Assistant

## 2019-08-27 DIAGNOSIS — R140 Abdominal distension (gaseous): Secondary | ICD-10-CM | POA: Diagnosis not present

## 2019-08-27 DIAGNOSIS — K2970 Gastritis, unspecified, without bleeding: Secondary | ICD-10-CM | POA: Diagnosis not present

## 2019-08-27 DIAGNOSIS — R1110 Vomiting, unspecified: Secondary | ICD-10-CM

## 2019-08-27 DIAGNOSIS — K5900 Constipation, unspecified: Secondary | ICD-10-CM | POA: Diagnosis not present

## 2019-08-27 LAB — PTH, INTACT AND CALCIUM
Calcium: 10.4 mg/dL — ABNORMAL HIGH (ref 8.7–10.3)
PTH: 52 pg/mL (ref 15–65)

## 2019-08-27 LAB — URIC ACID: Uric Acid: 4.8 mg/dL (ref 3.0–7.2)

## 2019-08-29 ENCOUNTER — Other Ambulatory Visit: Payer: Self-pay

## 2019-08-29 ENCOUNTER — Other Ambulatory Visit: Payer: Self-pay | Admitting: Family Medicine

## 2019-08-29 ENCOUNTER — Ambulatory Visit
Admission: RE | Admit: 2019-08-29 | Discharge: 2019-08-29 | Disposition: A | Discharge status: Home / Self Care | Payer: Medicare Other | Source: Ambulatory Visit | Attending: Family Medicine | Admitting: Family Medicine

## 2019-08-29 DIAGNOSIS — Z794 Long term (current) use of insulin: Secondary | ICD-10-CM

## 2019-08-29 DIAGNOSIS — E118 Type 2 diabetes mellitus with unspecified complications: Secondary | ICD-10-CM

## 2019-08-29 DIAGNOSIS — M19072 Primary osteoarthritis, left ankle and foot: Secondary | ICD-10-CM | POA: Diagnosis not present

## 2019-08-29 DIAGNOSIS — M79675 Pain in left toe(s): Secondary | ICD-10-CM

## 2019-08-29 MED ORDER — ACCU-CHEK AVIVA PLUS VI STRP: ORAL_STRIP | 12 refills | Status: DC

## 2019-08-29 NOTE — Telephone Encounter (Signed)
Patient calls nurse line to report that her glucometer supplies were not being covered by insurance. Spoke with pharmacist that stated glucometer strips needed to be sent in by provider that is [employer] certified. Re-sent rx under Dr. Andria Frames (precepting doctor).   Patient also reports that the pain in her toe is feeling better after starting new medication.   Patient informed of status of referral and reminded to go to [facility] for X-Ray.   Patient verbalized understanding.   To PCP and Dr. Grandville Silos (saw patient on 08/26/19)  Talbot Grumbling, RN

## 2019-09-05 ENCOUNTER — Ambulatory Visit: Payer: Medicare Other | Attending: Family Medicine | Admitting: Physical Therapy

## 2019-09-05 ENCOUNTER — Other Ambulatory Visit: Payer: Self-pay

## 2019-09-05 ENCOUNTER — Encounter: Payer: Self-pay | Admitting: Physical Therapy

## 2019-09-05 DIAGNOSIS — M6281 Muscle weakness (generalized): Secondary | ICD-10-CM | POA: Insufficient documentation

## 2019-09-05 DIAGNOSIS — G8929 Other chronic pain: Secondary | ICD-10-CM | POA: Diagnosis not present

## 2019-09-05 DIAGNOSIS — R2689 Other abnormalities of gait and mobility: Secondary | ICD-10-CM | POA: Insufficient documentation

## 2019-09-05 DIAGNOSIS — M545 Low back pain: Secondary | ICD-10-CM | POA: Insufficient documentation

## 2019-09-05 DIAGNOSIS — M25562 Pain in left knee: Secondary | ICD-10-CM | POA: Diagnosis not present

## 2019-09-05 DIAGNOSIS — M25561 Pain in right knee: Secondary | ICD-10-CM | POA: Insufficient documentation

## 2019-09-05 NOTE — Therapy (Signed)
[place], [place], [address] Phone: [phone]   Fax:  [phone]  Physical Therapy Evaluation  Patient Details  Name: Alicia [name] MRN: [id number] Date of Birth: 12/10/1955 Referring Provider (PT): Dorcas Mcmurray, MD   Encounter Date: 09/05/2019  PT End of Session - 09/05/19 0904    Visit Number  1    Number of Visits  17    Date for PT Re-Evaluation  10/31/19    Authorization Type  MCR- PN at visit 10    PT Start Time  0850    PT Stop Time  0932    PT Time Calculation (min)  42 min    Activity Tolerance  Patient tolerated treatment well    Behavior During Therapy  [facility] for tasks assessed/performed       Past Medical History:  Diagnosis Date  . Anemia   . ARTHRITIS, KNEE 09/17/2007  . Asthma 04/04/2010  . Cataract 01/07/2019  . Closed fracture of distal end of left radius 11/01/2015  . Complete deafness    Meningitis at age 89  . Deaf   . Diabetes mellitus   . Diabetic neuropathy ([place])   . Ganglion cyst 06/22/2011  . Gastroparesis   . GERD (gastroesophageal reflux disease)   . H/O: C-section   . Hyperlipidemia   . Hypertension   . Neuromuscular disorder ([place])    diabetic neuropathy  . PSVT (paroxysmal supraventricular tachycardia) ([place]) 11/09/2017   Event monitor 09/14/2017 - Predominantly sinus rhythm with episodes of narrow-complex tachycardia suggestive of paroxysmal supraventricular tachycardia.  . S/P appy     Past Surgical History:  Procedure Laterality Date  . APPENDECTOMY    . cardiolyte EF 77%, no ischemia in 2006  2006  . CARPAL TUNNEL RELEASE Right 04/20/2015   Procedure: RIGHT CARPAL TUNNEL RELEASE;  Surgeon: Daryll Brod, MD;  Location: [place];  Service: Orthopedics;  Laterality: Right;  . CARPAL TUNNEL RELEASE Left 11/04/2015   Procedure: CARPAL TUNNEL RELEASE;  Surgeon: Daryll Brod, MD;  Location: [place];  Service: Orthopedics;  Laterality: Left;   . CESAREAN SECTION    . OPEN REDUCTION INTERNAL FIXATION (ORIF) DISTAL RADIAL FRACTURE Left 11/04/2015   Procedure: OPEN REDUCTION INTERNAL FIXATION (ORIF) LEFT DISTAL RADIAL FRACTURE POSSIBLE BONE GRAFT;  Surgeon: Daryll Brod, MD;  Location: [place];  Service: Orthopedics;  Laterality: Left;  . TRIGGER FINGER RELEASE Right 04/20/2015   Procedure: RELEASE TRIGGER FINGER/A-1 PULLEY RIGHT MIDDLE FINGER,RIGHT RING FINGER;  Surgeon: Daryll Brod, MD;  Location: [place];  Service: Orthopedics;  Laterality: Right;  . TUBAL LIGATION    . ULNAR NERVE TRANSPOSITION Right 04/20/2015   Procedure: RIGHT ULNAR NERVE DECOMPRESSION;  Surgeon: Daryll Brod, MD;  Location: [place];  Service: Orthopedics;  Laterality: Right;  . UTERINE FIBROID EMBOLIZATION  2007  . WRIST SURGERY     Cyst removed on left  . WRIST SURGERY Right 1985   tendon repair R wrist    There were no vitals filed for this visit.   Subjective Assessment - 09/05/19 0853    Subjective  Both knees hurt and jerking of my whole body. Ill have a cup in my hand and jerk and it will drop and seems to be getting worse. Began last month or so. I have been ramping gabapentin up over time and maybe that is causing it. I talked to the MD about changin it but they said we  cannot decrease it. I can feel pretty well in my feet. Have an apt with foot specialist coming up about possible cyst in Lt foot that was determined not to be gout. Medial joint pain in bilat knees    How long can you sit comfortably?  about 4 hours    How long can you stand comfortably?  about 4 hours    How long can you walk comfortably?  not bad    Currently in Pain?  No/denies    Pain Location  Knee    Pain Orientation  Right;Left    Aggravating Factors   AM pain, sitting or standing too long, touching them         [facility] PT Assessment - 09/05/19 0001      Assessment   Medical Diagnosis  bil knee pain    Referring Provider  (PT)  Dorcas Mcmurray, MD    Onset Date/Surgical Date  --   about 2 months ago, jerking about a month ago   Hand Dominance  Right      Precautions   Precautions  None      Restrictions   Weight Bearing Restrictions  No      Balance Screen   Has the patient fallen in the past 6 months  No      [place] residence    Additional Comments  second floor apartment      Prior Function   Level of Independence  Independent    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      Cognition   Overall Cognitive Status  Within Functional Limits for tasks assessed      Observation/Other Assessments   Focus on Therapeutic Outcomes (FOTO)   52% limited      Sensation   Light Touch  Appears Intact      ROM / Strength   AROM / PROM / Strength  Strength      Strength   Strength Assessment Site  Knee;Hip    Right/Left Hip  Right;Left    Right Hip Flexion  4/5    Right Hip ABduction  4/5    Left Hip Flexion  4+/5    Left Hip ABduction  4+/5    Right/Left Knee  Right;Left    Right Knee Flexion  4+/5    Right Knee Extension  5/5    Left Knee Flexion  4-/5    Left Knee Extension  5/5      High Level Balance   High Level Balance Comments  NBOS &EC req SBA, tandem & SLS req maxA for support/balance correction                Objective measurements completed on examination: See above findings.      [place] Adult PT Treatment/Exercise - 09/05/19 0001      Exercises   Exercises  Knee/Hip      Knee/Hip Exercises: Stretches   Passive Hamstring Stretch Limitations  seated EOB    Piriformis Stretch Limitations  seated    Gastroc Stretch Limitations  standing lunge    Other Knee/Hip Stretches  standing & seated ADD stretch             PT Education - 09/05/19 0927    Education Details  antomy of condition, POC, HEP, exercise form/rationale    Person(s) Educated  Patient    Methods  Explanation;Demonstration;Tactile  cues;Handout    Comprehension  Verbalized understanding;Returned demonstration;Tactile  cues required;Need further instruction       PT Short Term Goals - 09/05/19 0936      PT SHORT TERM GOAL #1   Title  able to use her stretches during the day to maintain lower levels of pain    Baseline  began educating at eval    Time  3    Period  Weeks    Status  New    Target Date  09/26/19      PT SHORT TERM GOAL #2   Title  .        PT Long Term Goals - 09/05/19 0934      PT LONG TERM GOAL #1   Title  Independent with long term HEP    Baseline  began establishing at eval    Time  8    Period  Weeks    Status  New    Target Date  10/31/19      PT LONG TERM GOAL #2   Title  demo SLS at least 5s bilaterally with stable control    Baseline  unable at eval    Time  8    Period  Weeks    Status  New    Target Date  10/31/19      PT LONG TERM GOAL #3   Title  gross LE strength 5/5    Baseline  see flowsheet    Time  8    Period  Weeks    Status  New    Target Date  10/31/19      PT LONG TERM GOAL #4   Title  Pt will be able to stand from her car without increase in knee pain and without LOB    Baseline  pain with freqent falling back into car at eval    Time  8    Period  Weeks    Status  New    Target Date  10/31/19      PT LONG TERM GOAL #5   Title  FOTO to 41% limited    Baseline  52% limited at eval    Time  8    Period  Weeks    Status  New    Target Date  10/31/19             Plan - 09/05/19 [id number]    Clinical Impression Statement  Pt presents to PT with complaints of bil knee pain that began about 2 months ago of insidious onset. Seems to be associated with an onset of "jerking" that she does not know what causes it. Feels that she has very poor balance and falls backward into her car frequently when first standing up. Gross weakness and poor balance control noted. Did not keep up with exercises after last round of PT and we discussed the importance of  continuing after this round. Pt will benefit from skilled PT in order to improve strength and balance to reach goals.    Personal Factors and Comorbidities  Comorbidity 1    Comorbidities  diabetic neuropathy, poor balance, h/o hip and back pain    Examination-Activity Limitations  Bathing;Bed Mobility;Sit;Bend;Sleep;Squat;Stairs;Stand;Lift;Locomotion Level    Examination-Participation Restrictions  Other    Stability/Clinical Decision Making  Unstable/Unpredictable    Clinical Decision Making  High    Rehab Potential  Good    PT Frequency  2x / week    PT Duration  8 weeks    PT Treatment/Interventions  ADLs/Self Care Home  Management;Cryotherapy;Electrical Stimulation;Iontophoresis 4mg /ml Dexamethasone;Functional mobility training;Stair training;Gait training;Ultrasound;Traction;Moist Heat;Therapeutic activities;Therapeutic exercise;Balance training;Neuromuscular re-education;Patient/family education;Passive range of motion;Manual techniques;Dry needling;Taping    PT Next Visit Plan  gross strength    PT Home Exercise Plan  HSS, piriformis stretch, gastroc stretch, adductor stretch    Consulted and Agree with Plan of Care  Patient       Patient will benefit from skilled therapeutic intervention in order to improve the following deficits and impairments:  Increased muscle spasms, Decreased activity tolerance, Pain, Difficulty walking, Impaired flexibility, Decreased balance, Decreased strength  Visit Diagnosis: Chronic pain of left knee - Plan: PT plan of care cert/re-cert  Chronic pain of right knee - Plan: PT plan of care cert/re-cert  Muscle weakness (generalized) - Plan: PT plan of care cert/re-cert  Other abnormalities of gait and mobility - Plan: PT plan of care cert/re-cert     Problem List Patient Active Problem List   Diagnosis Date Noted  . Pes anserinus bursitis of both knees 08/02/2019  . Dysgeusia 07/03/2019  . Thumb laceration 06/20/2019  . Great toe pain, right  06/20/2019  . Frequent loose stools 04/20/2019  . Pruritus 03/07/2019  . Hyperthyroidism 03/07/2019  . Hypertension 01/07/2019  . Hypercalcemia 10/08/2018  . Seasonal allergies 08/27/2018  . Osteoarthritis involving multiple joints on both sides of body 05/25/2018  . Statin intolerance 12/26/2017  . PSVT (paroxysmal supraventricular tachycardia) ([place]) 11/09/2017  . Chest pain 08/30/2017  . Hyperlipidemia 08/30/2017  . OSA (obstructive sleep apnea) 08/31/2016  . Diabetes ([place]) 09/14/2015  . Muscle pain 09/28/2014  . Seizure ([place]) 06/08/2014  . Iron deficiency anemia 07/01/2012  . Diabetic retinopathy ([place]) 07/21/2011  . GERD 12/30/2007  . Diabetic polyneuropathy ([place]) 08/30/2006  . Type 2 diabetes mellitus with ophthalmic complication ([place]) [id number]  . HYPERCHOLESTEROLEMIA 08/30/2006  . Hearing loss 08/30/2006   Alicia [name] PT, DPT 09/05/19 11:20 AM   [facility] [address] [place], [place], [address] Phone: [phone]   Fax:  [phone]  Name: Alicia [name] MRN: [id number] Date of Birth: Dec 17, 1955

## 2019-09-06 ENCOUNTER — Ambulatory Visit: Payer: Medicare Other | Attending: Internal Medicine

## 2019-09-06 DIAGNOSIS — Z23 Encounter for immunization: Secondary | ICD-10-CM | POA: Insufficient documentation

## 2019-09-06 NOTE — Progress Notes (Signed)
   Covid-19 Vaccination Clinic  Name:  Alicia [name]    MRN: [id number] DOB: [birth year]  09/06/2019  [name] was observed post Covid-19 immunization for 15 minutes without incident. She was provided with Vaccine Information Sheet and instruction to access the V-Safe system.   [name] was instructed to call 911 with any severe reactions post vaccine: Marland Kitchen Difficulty breathing  . Swelling of face and throat  . A fast heartbeat  . A bad rash all over body  . Dizziness and weakness   Immunizations Administered    Name Date Dose VIS Date Route   Pfizer COVID-19 Vaccine 09/06/2019  9:21 AM 0.3 mL 06/13/2019 Intramuscular   Manufacturer: [place]   Lot: [id number]   [place]: [id number]

## 2019-09-08 ENCOUNTER — Other Ambulatory Visit: Payer: Self-pay | Admitting: Family Medicine

## 2019-09-09 ENCOUNTER — Other Ambulatory Visit: Payer: Self-pay

## 2019-09-09 ENCOUNTER — Encounter (HOSPITAL_COMMUNITY)
Admission: RE | Admit: 2019-09-09 | Discharge: 2019-09-09 | Disposition: A | Discharge status: Home / Self Care | Payer: Medicare Other | Source: Ambulatory Visit | Attending: Physician Assistant | Admitting: Physician Assistant

## 2019-09-09 DIAGNOSIS — R1110 Vomiting, unspecified: Secondary | ICD-10-CM | POA: Diagnosis not present

## 2019-09-13 ENCOUNTER — Other Ambulatory Visit: Payer: Self-pay | Admitting: Family Medicine

## 2019-09-13 DIAGNOSIS — E119 Type 2 diabetes mellitus without complications: Secondary | ICD-10-CM

## 2019-09-13 DIAGNOSIS — Z794 Long term (current) use of insulin: Secondary | ICD-10-CM

## 2019-09-16 ENCOUNTER — Telehealth: Payer: Self-pay

## 2019-09-16 DIAGNOSIS — M792 Neuralgia and neuritis, unspecified: Secondary | ICD-10-CM

## 2019-09-16 MED ORDER — GABAPENTIN 800 MG PO TABS: 800.0000 mg | ORAL_TABLET | Freq: Two times a day (BID) | ORAL | 2 refills | Status: DC | PRN

## 2019-09-16 NOTE — Telephone Encounter (Signed)
Prescribed 800 mg tablets twice daily PRN.

## 2019-09-16 NOTE — Telephone Encounter (Signed)
Patient calls nurse line stating her insurance no longer covers her dose of Gabapentin, as of 08/21/2019. I called her pharmacy to get more information. Per pharmacist, her insurance will only cover 3 capsules. Patient is currently taking 6 capsules per day. A way to combat this would be to send in a higher strength, pharmacist suggested 800mg . Please advise.

## 2019-09-17 ENCOUNTER — Other Ambulatory Visit: Payer: Self-pay

## 2019-09-17 ENCOUNTER — Encounter: Payer: Self-pay | Admitting: Physical Therapy

## 2019-09-17 ENCOUNTER — Ambulatory Visit: Payer: Medicare Other | Admitting: Physical Therapy

## 2019-09-17 DIAGNOSIS — G8929 Other chronic pain: Secondary | ICD-10-CM | POA: Diagnosis not present

## 2019-09-17 DIAGNOSIS — M545 Low back pain: Secondary | ICD-10-CM | POA: Diagnosis not present

## 2019-09-17 DIAGNOSIS — M6281 Muscle weakness (generalized): Secondary | ICD-10-CM | POA: Diagnosis not present

## 2019-09-17 DIAGNOSIS — R2689 Other abnormalities of gait and mobility: Secondary | ICD-10-CM

## 2019-09-17 DIAGNOSIS — M25561 Pain in right knee: Secondary | ICD-10-CM | POA: Diagnosis not present

## 2019-09-17 DIAGNOSIS — M25562 Pain in left knee: Secondary | ICD-10-CM | POA: Diagnosis not present

## 2019-09-17 NOTE — Patient Instructions (Signed)
Access Code: [email or web address] URL: [email or web address] Date: 09/17/2019 Prepared by: Hessie Diener  Exercises Bird Dog - 2 x daily - 7 x weekly - 2 sets - 10 reps - 5-10 hold Sidelying Hip Abduction - 2 x daily - 7 x weekly - 2 sets - 10 reps

## 2019-09-17 NOTE — Therapy (Signed)
[place], [place], [address] Phone: [phone]   Fax:  [phone]  Physical Therapy Treatment  Patient Details  Name: Alicia [name] MRN: [id number] Date of Birth: 11/26/1955 Referring Provider (PT): Dorcas Mcmurray, MD   Encounter Date: 09/17/2019  PT End of Session - 09/17/19 0852    Visit Number  2    Number of Visits  17    Date for PT Re-Evaluation  10/31/19    Authorization Type  MCR- PN at visit 10    PT Start Time  0845    PT Stop Time  0925    PT Time Calculation (min)  40 min       Past Medical History:  Diagnosis Date  . Anemia   . ARTHRITIS, KNEE 09/17/2007  . Asthma 04/04/2010  . Cataract 01/07/2019  . Closed fracture of distal end of left radius 11/01/2015  . Complete deafness    Meningitis at age 64  . Deaf   . Diabetes mellitus   . Diabetic neuropathy ([place])   . Ganglion cyst 06/22/2011  . Gastroparesis   . GERD (gastroesophageal reflux disease)   . H/O: C-section   . Hyperlipidemia   . Hypertension   . Neuromuscular disorder ([place])    diabetic neuropathy  . PSVT (paroxysmal supraventricular tachycardia) ([place]) 11/09/2017   Event monitor 09/14/2017 - Predominantly sinus rhythm with episodes of narrow-complex tachycardia suggestive of paroxysmal supraventricular tachycardia.  . S/P appy     Past Surgical History:  Procedure Laterality Date  . APPENDECTOMY    . cardiolyte EF 77%, no ischemia in 2006  2006  . CARPAL TUNNEL RELEASE Right 04/20/2015   Procedure: RIGHT CARPAL TUNNEL RELEASE;  Surgeon: Daryll Brod, MD;  Location: [place];  Service: Orthopedics;  Laterality: Right;  . CARPAL TUNNEL RELEASE Left 11/04/2015   Procedure: CARPAL TUNNEL RELEASE;  Surgeon: Daryll Brod, MD;  Location: [place];  Service: Orthopedics;  Laterality: Left;  . CESAREAN SECTION    . OPEN REDUCTION INTERNAL FIXATION (ORIF) DISTAL RADIAL FRACTURE Left 11/04/2015   Procedure:  OPEN REDUCTION INTERNAL FIXATION (ORIF) LEFT DISTAL RADIAL FRACTURE POSSIBLE BONE GRAFT;  Surgeon: Daryll Brod, MD;  Location: [place];  Service: Orthopedics;  Laterality: Left;  . TRIGGER FINGER RELEASE Right 04/20/2015   Procedure: RELEASE TRIGGER FINGER/A-1 PULLEY RIGHT MIDDLE FINGER,RIGHT RING FINGER;  Surgeon: Daryll Brod, MD;  Location: [place];  Service: Orthopedics;  Laterality: Right;  . TUBAL LIGATION    . ULNAR NERVE TRANSPOSITION Right 04/20/2015   Procedure: RIGHT ULNAR NERVE DECOMPRESSION;  Surgeon: Daryll Brod, MD;  Location: [place];  Service: Orthopedics;  Laterality: Right;  . UTERINE FIBROID EMBOLIZATION  2007  . WRIST SURGERY     Cyst removed on left  . WRIST SURGERY Right 1985   tendon repair R wrist    There were no vitals filed for this visit.  Subjective Assessment - 09/17/19 0851    Subjective  I could not do the seated adductor stretch, it hurt my back. Back pain today and inside of the knee feels like something has hit it sometimes. Sometimes left big toe hurts due to cyst.    Currently in Pain?  Yes    Pain Score  7    up to 9-10/10   Pain Location  Back    Pain Orientation  Right;Left;Lower    Pain Descriptors / Indicators  Aching  Aggravating Factors   getting up from sitting, grocerry shopping, needs cart    Pain Relieving Factors  sometimes at rest                       [facility] Adult PT Treatment/Exercise - 09/17/19 0001      Knee/Hip Exercises: Stretches   Passive Hamstring Stretch Limitations  seated EOB    Piriformis Stretch Limitations  seated    Gastroc Stretch Limitations  standing lunge    Other Knee/Hip Stretches  prefers standing      Knee/Hip Exercises: Seated   Sit to Sand  10 reps   hands on thighs.      Knee/Hip Exercises: Supine   Quad Sets  10 reps    Quad Sets Limitations  tactile cues and verbal cues for correct technique     Short Arc [employer]  10 reps     Bridges  10 reps    Bridges with Clamshell  10 reps   green   Straight Leg Raises  10 reps    Straight Leg Raises Limitations  fatigues with reps     Other Supine Knee/Hip Exercises  green band clam with abdominal draw in             PT Education - 09/17/19 0922    Education Details  HEP    Person(s) Educated  Patient    Methods  Explanation;Handout    Comprehension  Verbalized understanding       PT Short Term Goals - 09/05/19 0936      PT SHORT TERM GOAL #1   Title  able to use her stretches during the day to maintain lower levels of pain    Baseline  began educating at eval    Time  3    Period  Weeks    Status  New    Target Date  09/26/19      PT SHORT TERM GOAL #2   Title  .        PT Long Term Goals - 09/05/19 0934      PT LONG TERM GOAL #1   Title  Independent with long term HEP    Baseline  began establishing at eval    Time  8    Period  Weeks    Status  New    Target Date  10/31/19      PT LONG TERM GOAL #2   Title  demo SLS at least 5s bilaterally with stable control    Baseline  unable at eval    Time  8    Period  Weeks    Status  New    Target Date  10/31/19      PT LONG TERM GOAL #3   Title  gross LE strength 5/5    Baseline  see flowsheet    Time  8    Period  Weeks    Status  New    Target Date  10/31/19      PT LONG TERM GOAL #4   Title  Pt will be able to stand from her car without increase in knee pain and without LOB    Baseline  pain with freqent falling back into car at eval    Time  8    Period  Weeks    Status  New    Target Date  10/31/19      PT LONG TERM GOAL #5  Title  FOTO to 41% limited    Baseline  52% limited at eval    Time  8    Period  Weeks    Status  New    Target Date  10/31/19            Plan - 09/17/19 1119    Clinical Impression Statement  Pt reports back pain today. Reviewed HEP and she is independent. Seated adductor stretch causes back pain so she prefers the standing version.  Continued with gross hip strengh and gentle knee strength per PT POC. Updated HEP. Some increased pain with sit-stands in left knee.    PT Next Visit Plan  gross strength, check newest HEP    PT Home Exercise Plan  HSS, piriformis stretch, gastroc stretch, adductor stretch, added SLR, sit-stands, bridge with CLAM (green)       Patient will benefit from skilled therapeutic intervention in order to improve the following deficits and impairments:  Increased muscle spasms, Decreased activity tolerance, Pain, Difficulty walking, Impaired flexibility, Decreased balance, Decreased strength  Visit Diagnosis: Chronic pain of left knee  Chronic pain of right knee  Muscle weakness (generalized)  Other abnormalities of gait and mobility     Problem List Patient Active Problem List   Diagnosis Date Noted  . Pes anserinus bursitis of both knees 08/02/2019  . Dysgeusia 07/03/2019  . Thumb laceration 06/20/2019  . Great toe pain, right 06/20/2019  . Frequent loose stools 04/20/2019  . Pruritus 03/07/2019  . Hyperthyroidism 03/07/2019  . Hypertension 01/07/2019  . Hypercalcemia 10/08/2018  . Seasonal allergies 08/27/2018  . Osteoarthritis involving multiple joints on both sides of body 05/25/2018  . Statin intolerance 12/26/2017  . PSVT (paroxysmal supraventricular tachycardia) ([place]) 11/09/2017  . Chest pain 08/30/2017  . Hyperlipidemia 08/30/2017  . OSA (obstructive sleep apnea) 08/31/2016  . Diabetes ([place]) 09/14/2015  . Muscle pain 09/28/2014  . Seizure ([place]) 06/08/2014  . Iron deficiency anemia 07/01/2012  . Diabetic retinopathy ([place]) 07/21/2011  . GERD 12/30/2007  . Diabetic polyneuropathy ([place]) 08/30/2006  . Type 2 diabetes mellitus with ophthalmic complication ([place]) [id number]  . HYPERCHOLESTEROLEMIA 08/30/2006  . Hearing loss 08/30/2006    Dorene Ar, PTA 09/17/2019, 11:30 AM  [place], [place], [address] Phone: [phone]   Fax:  [phone]  Name: PAILEY WILLENBRINK MRN: [id number] Date of Birth: 08/31/1955

## 2019-09-18 ENCOUNTER — Ambulatory Visit (INDEPENDENT_AMBULATORY_CARE_PROVIDER_SITE_OTHER): Payer: Medicare Other | Admitting: Podiatry

## 2019-09-18 ENCOUNTER — Other Ambulatory Visit: Payer: Self-pay | Admitting: Podiatry

## 2019-09-18 ENCOUNTER — Ambulatory Visit (INDEPENDENT_AMBULATORY_CARE_PROVIDER_SITE_OTHER): Payer: Medicare Other

## 2019-09-18 ENCOUNTER — Encounter: Payer: Self-pay | Admitting: Podiatry

## 2019-09-18 VITALS — BP 124/60 | HR 82 | Temp 97.4°F

## 2019-09-18 DIAGNOSIS — M79675 Pain in left toe(s): Secondary | ICD-10-CM

## 2019-09-18 DIAGNOSIS — M792 Neuralgia and neuritis, unspecified: Secondary | ICD-10-CM

## 2019-09-18 DIAGNOSIS — M722 Plantar fascial fibromatosis: Secondary | ICD-10-CM

## 2019-09-18 DIAGNOSIS — M8560 Other cyst of bone, unspecified site: Secondary | ICD-10-CM

## 2019-09-18 DIAGNOSIS — M85679 Other cyst of bone, unspecified ankle and foot: Secondary | ICD-10-CM | POA: Diagnosis not present

## 2019-09-18 MED ORDER — DICLOFENAC SODIUM 1 % EX GEL: 2.0000 g | Freq: Four times a day (QID) | CUTANEOUS | 2 refills | Status: DC

## 2019-09-18 NOTE — Patient Instructions (Signed)
I have sent voltaren gel to the pharmacy for you   Plantar Fasciitis (Heel Spur Syndrome) with Rehab The plantar fascia is a fibrous, ligament-like, soft-tissue structure that spans the bottom of the foot. Plantar fasciitis is a condition that causes pain in the foot due to inflammation of the tissue. SYMPTOMS   Pain and tenderness on the underneath side of the foot.  Pain that worsens with standing or walking. CAUSES  Plantar fasciitis is caused by irritation and injury to the plantar fascia on the underneath side of the foot. Common mechanisms of injury include:  Direct trauma to bottom of the foot.  Damage to a small nerve that runs under the foot where the main fascia attaches to the heel bone.  Stress placed on the plantar fascia due to bone spurs. RISK INCREASES WITH:   Activities that place stress on the plantar fascia (running, jumping, pivoting, or cutting).  Poor strength and flexibility.  Improperly fitted shoes.  Tight calf muscles.  Flat feet.  Failure to warm-up properly before activity.  Obesity. PREVENTION  Warm up and stretch properly before activity.  Allow for adequate recovery between workouts.  Maintain physical fitness:  Strength, flexibility, and endurance.  Cardiovascular fitness.  Maintain a health body weight.  Avoid stress on the plantar fascia.  Wear properly fitted shoes, including arch supports for individuals who have flat feet.  PROGNOSIS  If treated properly, then the symptoms of plantar fasciitis usually resolve without surgery. However, occasionally surgery is necessary.  RELATED COMPLICATIONS   Recurrent symptoms that may result in a chronic condition.  Problems of the lower back that are caused by compensating for the injury, such as limping.  Pain or weakness of the foot during push-off following surgery.  Chronic inflammation, scarring, and partial or complete fascia tear, occurring more often from repeated  injections.  TREATMENT  Treatment initially involves the use of ice and medication to help reduce pain and inflammation. The use of strengthening and stretching exercises may help reduce pain with activity, especially stretches of the Achilles tendon. These exercises may be performed at home or with a therapist. Your caregiver may recommend that you use heel cups of arch supports to help reduce stress on the plantar fascia. Occasionally, corticosteroid injections are given to reduce inflammation. If symptoms persist for greater than 6 months despite non-surgical (conservative), then surgery may be recommended.   MEDICATION   If pain medication is necessary, then nonsteroidal anti-inflammatory medications, such as aspirin and ibuprofen, or other minor pain relievers, such as acetaminophen, are often recommended.  Do not take pain medication within 7 days before surgery.  Prescription pain relievers may be given if deemed necessary by your caregiver. Use only as directed and only as much as you need.  Corticosteroid injections may be given by your caregiver. These injections should be reserved for the most serious cases, because they may only be given a certain number of times.  HEAT AND COLD  Cold treatment (icing) relieves pain and reduces inflammation. Cold treatment should be applied for 10 to 15 minutes every 2 to 3 hours for inflammation and pain and immediately after any activity that aggravates your symptoms. Use ice packs or massage the area with a piece of ice (ice massage).  Heat treatment may be used prior to performing the stretching and strengthening activities prescribed by your caregiver, physical therapist, or athletic trainer. Use a heat pack or soak the injury in warm water.  SEEK IMMEDIATE MEDICAL CARE IF:  Treatment  seems to offer no benefit, or the condition worsens.  Any medications produce adverse side effects.  EXERCISES- RANGE OF MOTION (ROM) AND STRETCHING  EXERCISES - Plantar Fasciitis (Heel Spur Syndrome) These exercises may help you when beginning to rehabilitate your injury. Your symptoms may resolve with or without further involvement from your physician, physical therapist or athletic trainer. While completing these exercises, remember:   Restoring tissue flexibility helps normal motion to return to the joints. This allows healthier, less painful movement and activity.  An effective stretch should be held for at least 30 seconds.  A stretch should never be painful. You should only feel a gentle lengthening or release in the stretched tissue.  RANGE OF MOTION - Toe Extension, Flexion  Sit with your right / left leg crossed over your opposite knee.  Grasp your toes and gently pull them back toward the top of your foot. You should feel a stretch on the bottom of your toes and/or foot.  Hold this stretch for 10 seconds.  Now, gently pull your toes toward the bottom of your foot. You should feel a stretch on the top of your toes and or foot.  Hold this stretch for 10 seconds. Repeat  times. Complete this stretch 3 times per day.   RANGE OF MOTION - Ankle Dorsiflexion, Active Assisted  Remove shoes and sit on a chair that is preferably not on a carpeted surface.  Place right / left foot under knee. Extend your opposite leg for support.  Keeping your heel down, slide your right / left foot back toward the chair until you feel a stretch at your ankle or calf. If you do not feel a stretch, slide your bottom forward to the edge of the chair, while still keeping your heel down.  Hold this stretch for 10 seconds. Repeat 3 times. Complete this stretch 2 times per day.   STRETCH  Gastroc, Standing  Place hands on wall.  Extend right / left leg, keeping the front knee somewhat bent.  Slightly point your toes inward on your back foot.  Keeping your right / left heel on the floor and your knee straight, shift your weight toward the wall,  not allowing your back to arch.  You should feel a gentle stretch in the right / left calf. Hold this position for 10 seconds. Repeat 3 times. Complete this stretch 2 times per day.  STRETCH  Soleus, Standing  Place hands on wall.  Extend right / left leg, keeping the other knee somewhat bent.  Slightly point your toes inward on your back foot.  Keep your right / left heel on the floor, bend your back knee, and slightly shift your weight over the back leg so that you feel a gentle stretch deep in your back calf.  Hold this position for 10 seconds. Repeat 3 times. Complete this stretch 2 times per day.  STRETCH  Gastrocsoleus, Standing  Note: This exercise can place a lot of stress on your foot and ankle. Please complete this exercise only if specifically instructed by your caregiver.   Place the ball of your right / left foot on a step, keeping your other foot firmly on the same step.  Hold on to the wall or a rail for balance.  Slowly lift your other foot, allowing your body weight to press your heel down over the edge of the step.  You should feel a stretch in your right / left calf.  Hold this position for 10 seconds.  Repeat this exercise with a slight bend in your right / left knee. Repeat 3 times. Complete this stretch 2 times per day.   STRENGTHENING EXERCISES - Plantar Fasciitis (Heel Spur Syndrome)  These exercises may help you when beginning to rehabilitate your injury. They may resolve your symptoms with or without further involvement from your physician, physical therapist or athletic trainer. While completing these exercises, remember:   Muscles can gain both the endurance and the strength needed for everyday activities through controlled exercises.  Complete these exercises as instructed by your physician, physical therapist or athletic trainer. Progress the resistance and repetitions only as guided.  STRENGTH - Towel Curls  Sit in a chair positioned on a  non-carpeted surface.  Place your foot on a towel, keeping your heel on the floor.  Pull the towel toward your heel by only curling your toes. Keep your heel on the floor. Repeat 3 times. Complete this exercise 2 times per day.  STRENGTH - Ankle Inversion  Secure one end of a rubber exercise band/tubing to a fixed object (table, pole). Loop the other end around your foot just before your toes.  Place your fists between your knees. This will focus your strengthening at your ankle.  Slowly, pull your big toe up and in, making sure the band/tubing is positioned to resist the entire motion.  Hold this position for 10 seconds.  Have your muscles resist the band/tubing as it slowly pulls your foot back to the starting position. Repeat 3 times. Complete this exercises 2 times per day.  Document Released: 06/19/2005 Document Revised: 09/11/2011 Document Reviewed: 10/01/2008 [facility] Patient Information 2014 [place], [place].

## 2019-09-19 ENCOUNTER — Ambulatory Visit: Payer: Medicare Other | Admitting: Physical Therapy

## 2019-09-19 NOTE — Progress Notes (Signed)
Subjective:   Patient ID: Flora Lipps, female   DOB: 64 y.o.   MRN: [id number]   HPI 64 year old female presents the office today for concerns of pain in the left big toe.  She is describing sharp pains at times in the big toe.  She had an x-ray performed previously that did show a cyst.  She also describes some discomfort in the arch of the foot.  Most of the sharp pain in the big toe occurs with walking.  Does not wake her up at nighttime.  No radiating pain or weakness.  She is on gabapentin which seems to be neuropathy.   Review of Systems  All other systems reviewed and are negative.  Past Medical History:  Diagnosis Date  . Anemia   . ARTHRITIS, KNEE 09/17/2007  . Asthma 04/04/2010  . Cataract 01/07/2019  . Closed fracture of distal end of left radius 11/01/2015  . Complete deafness    Meningitis at age 72  . Deaf   . Diabetes mellitus   . Diabetic neuropathy ([place])   . Ganglion cyst 06/22/2011  . Gastroparesis   . GERD (gastroesophageal reflux disease)   . H/O: C-section   . Hyperlipidemia   . Hypertension   . Neuromuscular disorder ([place])    diabetic neuropathy  . PSVT (paroxysmal supraventricular tachycardia) ([place]) 11/09/2017   Event monitor 09/14/2017 - Predominantly sinus rhythm with episodes of narrow-complex tachycardia suggestive of paroxysmal supraventricular tachycardia.  . S/P appy     Past Surgical History:  Procedure Laterality Date  . APPENDECTOMY    . cardiolyte EF 77%, no ischemia in 2006  2006  . CARPAL TUNNEL RELEASE Right 04/20/2015   Procedure: RIGHT CARPAL TUNNEL RELEASE;  Surgeon: Daryll Brod, MD;  Location: [facility];  Service: Orthopedics;  Laterality: Right;  . CARPAL TUNNEL RELEASE Left 11/04/2015   Procedure: CARPAL TUNNEL RELEASE;  Surgeon: Daryll Brod, MD;  Location: [facility];  Service: Orthopedics;  Laterality: Left;  . CESAREAN SECTION    . OPEN REDUCTION INTERNAL FIXATION (ORIF) DISTAL RADIAL FRACTURE Left  11/04/2015   Procedure: OPEN REDUCTION INTERNAL FIXATION (ORIF) LEFT DISTAL RADIAL FRACTURE POSSIBLE BONE GRAFT;  Surgeon: Daryll Brod, MD;  Location: [facility];  Service: Orthopedics;  Laterality: Left;  . TRIGGER FINGER RELEASE Right 04/20/2015   Procedure: RELEASE TRIGGER FINGER/A-1 PULLEY RIGHT MIDDLE FINGER,RIGHT RING FINGER;  Surgeon: Daryll Brod, MD;  Location: [facility];  Service: Orthopedics;  Laterality: Right;  . TUBAL LIGATION    . ULNAR NERVE TRANSPOSITION Right 04/20/2015   Procedure: RIGHT ULNAR NERVE DECOMPRESSION;  Surgeon: Daryll Brod, MD;  Location: [facility];  Service: Orthopedics;  Laterality: Right;  . UTERINE FIBROID EMBOLIZATION  2007  . WRIST SURGERY     Cyst removed on left  . WRIST SURGERY Right 1985   tendon repair R wrist     Current Outpatient Medications:  .  omeprazole (PRILOSEC) 20 MG capsule, Take 1 capsule (20 mg total) by mouth 2 (two) times daily before a meal., Disp: 180 capsule, Rfl: 1 .  ACCU-CHEK SOFTCLIX LANCETS lancets, TEST 4 TIMES A DAY, Disp: 200 each, Rfl: 0 .  acetaminophen (TYLENOL) 500 MG tablet, Take 500 mg by mouth every 4 (four) hours as needed., Disp: , Rfl:  .  aspirin ([employer] ASPIRIN) 81 MG chewable tablet, Chew 81 mg by mouth daily.  , Disp: , Rfl:  .  carvedilol (COREG) 25 MG tablet, TAKE 1  TABLET (25 MG TOTAL) BY MOUTH 2 (TWO) TIMES DAILY WITH A MEAL., Disp: 180 tablet, Rfl: 3 .  cholecalciferol (VITAMIN D) 1000 UNITS tablet, Take 1,000 Units by mouth daily., Disp: , Rfl:  .  colchicine 0.6 MG tablet, Take 2 pills (1.2 mg) on day 1, and 1 pill on day 2, Disp: 3 tablet, Rfl: 0 .  diclofenac Sodium (VOLTAREN) 1 % GEL, Apply 2 g topically 3 (three) times daily., Disp: 100 g, Rfl: 0 .  diclofenac Sodium (VOLTAREN) 1 % GEL, Apply 2 g topically 4 (four) times daily. Rub into affected area of foot 2 to 4 times daily, Disp: 100 g, Rfl: 2 .  diltiazem (CARDIZEM CD) 240 MG 24 hr capsule, TAKE  1 CAPSULE BY MOUTH EVERY DAY, Disp: 30 capsule, Rfl: 2 .  diltiazem (CARDIZEM) 30 MG tablet, TAKE 1 TABLET BY MOUTH AS NEEDED (HEART RACING OR PALPITATIONS THAT LAST LONGER THAN 15 MINS)., Disp: 90 tablet, Rfl: 3 .  famotidine (PEPCID) 20 MG tablet, TAKE 1 TABLET BY MOUTH TWICE A DAY, Disp: 180 tablet, Rfl: 1 .  ferrous sulfate 325 (65 FE) MG tablet, Take 325 mg by mouth daily with breakfast., Disp: , Rfl:  .  fluticasone (FLONASE) 50 MCG/ACT nasal spray, SPRAY 2 SPRAYS INTO EACH NOSTRIL EVERY DAY, Disp: 16 g, Rfl: 3 .  gabapentin (NEURONTIN) 800 MG tablet, Take 1 tablet (800 mg total) by mouth 2 (two) times daily as needed., Disp: 60 tablet, Rfl: 2 .  glucose blood (ACCU-CHEK AVIVA PLUS) test strip, CHECK SUGAR 3 TIMES A DAILY., Disp: 100 strip, Rfl: 12 .  hydrochlorothiazide (HYDRODIURIL) 12.5 MG tablet, TAKE 1 TABLET BY MOUTH EVERY DAY, Disp: 90 tablet, Rfl: 3 .  Insulin Glargine (BASAGLAR KWIKPEN) 100 UNIT/ML SOPN, INJECT 8 UNITS INTO THE SKIN DAILY., Disp: 15 pen, Rfl: 3 .  Insulin Pen Needle 31G X 5 MM MISC, 1 Container by Does not apply route once as needed for up to 1 dose., Disp: 100 each, Rfl: 11 .  LINZESS 72 MCG capsule, Take 72 mcg by mouth every morning., Disp: , Rfl:  .  liraglutide (VICTOZA) 18 MG/3ML SOPN, Inject 0.1 mLs (0.6 mg total) into the skin 1 day or 1 dose for 120 doses., Disp: 3 pen, Rfl: 3 .  losartan (COZAAR) 100 MG tablet, TAKE 1 TABLET BY MOUTH EVERY DAY, Disp: 90 tablet, Rfl: 3 .  meloxicam (MOBIC) 15 MG tablet, TAKE 1 TABLET BY MOUTH EVERY DAY, Disp: 30 tablet, Rfl: 1 .  metFORMIN (GLUCOPHAGE) 1000 MG tablet, TAKE 1 TABLET BY MOUTH TWICE A DAY, Disp: 180 tablet, Rfl: 0 .  nitroGLYCERIN (NITROSTAT) 0.3 MG SL tablet, Place 1 tablet (0.3 mg total) under the tongue every 5 (five) minutes as needed for chest pain., Disp: 30 tablet, Rfl: 0 .  NOVOLOG 100 UNIT/ML injection, INJECT 1-4 UNITS INTO THE SKIN 3 TIMES DAILY WITH MEALS., Disp: 30 mL, Rfl: 0 .  Olopatadine HCl 0.2  % SOLN, PLACE 1 DROP IN BOTH EYES DAILY, Disp: , Rfl:  .  rosuvastatin (CRESTOR) 5 MG tablet, Take 1 tablet (5 mg total) by mouth 2 (two) times a week., Disp: 26 tablet, Rfl: 3 .  senna-docusate (SENOKOT-S) 8.6-50 MG tablet, Take 1 tablet by mouth daily as needed for mild constipation or moderate constipation., Disp: 30 tablet, Rfl: 0  Current Facility-Administered Medications:  .  fexofenadine (ALLEGRA) tablet 60 mg, 60 mg, Oral, BID, Benay Pike, MD  Allergies  Allergen Reactions  . Sulfonamide Derivatives  Swelling and Rash    REACTION: rash, swelling - "Lost BABY" - Terrible itching.   . Lipitor [Atorvastatin Calcium] Other (See Comments)    Muscle Aches - Mild-Moderate - completely resolved with D/C of atorva.   . Ramipril Other (See Comments)    REACTION: cough Unknown reaction  . Trulicity [Dulaglutide] Nausea And Vomiting  . Atorvastatin Other (See Comments)    Unknown reaction  . Sulfamethoxazole Other (See Comments)    Unknown reaction        Objective:  Physical Exam  General: AAO x3, NAD  Dermatological: Skin is warm, dry and supple bilateral. Nails x 10 are well manicured; remaining integument appears unremarkable at this time. There are no open sores, no preulcerative lesions, no rash or signs of infection present.  Vascular: Dorsalis Pedis artery and Posterior Tibial artery pedal pulses are 2/4 bilateral with immedate capillary fill time. Pedal hair growth present. No varicosities and no lower extremity edema present bilateral. There is no pain with calf compression, swelling, warmth, erythema.   Neruologic: Grossly intact via light touch bilateral.Protective threshold with Semmes Wienstein monofilament intact to all pedal sites bilateral.   Musculoskeletal: On today's exam there is no tenderness palpation of the hallux itself.  No pain of the toenail.  There is no edema, erythema to the toe.  There is discomfort however on the medial band of plantar fascia just  on the distal portion of the ankle towards the distal insertion.  Plantar fascial appears to be intact.  No other areas of discomfort identified.  Muscular strength 5/5 in all groups tested bilateral.  Gait: Unassisted, Nonantalgic.       Assessment:   Neuritis, plantar fasciitis; bone cyst    Plan:  -Treatment options discussed including all alternatives, risks, and complications -Etiology of symptoms were discussed -X-rays were obtained and reviewed with the patient.  I reviewed the x-rays today.  The cysts are not as evident on today's x-ray.  No evidence of acute fracture. -More of her symptoms are result of neuritis that seems and she has discomfort on the plantar fascia. We discussed stretching, ice exercises daily.  Voltaren gel.  Discussed wearing shoes with support and also discussed inserts. -Regard to the cyst see her back in 1 month and repeat x-rays.  If still concern will order MRI.  Trula Slade DPM

## 2019-09-22 DIAGNOSIS — K219 Gastro-esophageal reflux disease without esophagitis: Secondary | ICD-10-CM | POA: Diagnosis not present

## 2019-09-22 DIAGNOSIS — K5900 Constipation, unspecified: Secondary | ICD-10-CM | POA: Diagnosis not present

## 2019-09-22 DIAGNOSIS — R140 Abdominal distension (gaseous): Secondary | ICD-10-CM | POA: Diagnosis not present

## 2019-09-23 ENCOUNTER — Ambulatory Visit: Payer: Medicare Other | Admitting: Physical Therapy

## 2019-09-23 ENCOUNTER — Encounter: Payer: Self-pay | Admitting: Physical Therapy

## 2019-09-23 ENCOUNTER — Other Ambulatory Visit: Payer: Self-pay

## 2019-09-23 DIAGNOSIS — M25562 Pain in left knee: Secondary | ICD-10-CM | POA: Diagnosis not present

## 2019-09-23 DIAGNOSIS — R2689 Other abnormalities of gait and mobility: Secondary | ICD-10-CM

## 2019-09-23 DIAGNOSIS — G8929 Other chronic pain: Secondary | ICD-10-CM | POA: Diagnosis not present

## 2019-09-23 DIAGNOSIS — M545 Low back pain: Secondary | ICD-10-CM | POA: Diagnosis not present

## 2019-09-23 DIAGNOSIS — M25561 Pain in right knee: Secondary | ICD-10-CM | POA: Diagnosis not present

## 2019-09-23 DIAGNOSIS — M6281 Muscle weakness (generalized): Secondary | ICD-10-CM | POA: Diagnosis not present

## 2019-09-23 NOTE — Patient Instructions (Signed)
Access Code: [email or web address] URL: [email or web address] Date: 09/23/2019 Prepared by: Hessie Diener  Exercises Single Leg Stance with Support - 2 x daily - 7 x weekly - 1 sets - 1-2 reps - 20-30 hold Standing Tandem Balance with Counter Support - 2 x daily - 7 x weekly - 1 sets - 1-2 reps - 20-30 hold

## 2019-09-23 NOTE — Therapy (Signed)
[place], [place], [address] Phone: [phone]   Fax:  [phone]  Physical Therapy Treatment  Patient Details  Name: Alicia [name] MRN: [id number] Date of Birth: Oct 25, 1955 Referring Provider (PT): Dorcas Mcmurray, MD   Encounter Date: 09/23/2019  PT End of Session - 09/23/19 0935    Visit Number  3    Number of Visits  17    Date for PT Re-Evaluation  10/31/19    Authorization Type  MCR- PN at visit 10    PT Start Time  0930    PT Stop Time  1015    PT Time Calculation (min)  45 min       Past Medical History:  Diagnosis Date  . Anemia   . ARTHRITIS, KNEE 09/17/2007  . Asthma 04/04/2010  . Cataract 01/07/2019  . Closed fracture of distal end of left radius 11/01/2015  . Complete deafness    Meningitis at age 64  . Deaf   . Diabetes mellitus   . Diabetic neuropathy ([facility])   . Ganglion cyst 06/22/2011  . Gastroparesis   . GERD (gastroesophageal reflux disease)   . H/O: C-section   . Hyperlipidemia   . Hypertension   . Neuromuscular disorder ([place])    diabetic neuropathy  . PSVT (paroxysmal supraventricular tachycardia) ([facility]) 11/09/2017   Event monitor 09/14/2017 - Predominantly sinus rhythm with episodes of narrow-complex tachycardia suggestive of paroxysmal supraventricular tachycardia.  . S/P appy     Past Surgical History:  Procedure Laterality Date  . APPENDECTOMY    . cardiolyte EF 77%, no ischemia in 2006  2006  . CARPAL TUNNEL RELEASE Right 04/20/2015   Procedure: RIGHT CARPAL TUNNEL RELEASE;  Surgeon: Daryll Brod, MD;  Location: [facility];  Service: Orthopedics;  Laterality: Right;  . CARPAL TUNNEL RELEASE Left 11/04/2015   Procedure: CARPAL TUNNEL RELEASE;  Surgeon: Daryll Brod, MD;  Location: [facility];  Service: Orthopedics;  Laterality: Left;  . CESAREAN SECTION    . OPEN REDUCTION INTERNAL FIXATION (ORIF) DISTAL RADIAL FRACTURE Left 11/04/2015   Procedure:  OPEN REDUCTION INTERNAL FIXATION (ORIF) LEFT DISTAL RADIAL FRACTURE POSSIBLE BONE GRAFT;  Surgeon: Daryll Brod, MD;  Location: [facility];  Service: Orthopedics;  Laterality: Left;  . TRIGGER FINGER RELEASE Right 04/20/2015   Procedure: RELEASE TRIGGER FINGER/A-1 PULLEY RIGHT MIDDLE FINGER,RIGHT RING FINGER;  Surgeon: Daryll Brod, MD;  Location: [facility];  Service: Orthopedics;  Laterality: Right;  . TUBAL LIGATION    . ULNAR NERVE TRANSPOSITION Right 04/20/2015   Procedure: RIGHT ULNAR NERVE DECOMPRESSION;  Surgeon: Daryll Brod, MD;  Location: [facility];  Service: Orthopedics;  Laterality: Right;  . UTERINE FIBROID EMBOLIZATION  2007  . WRIST SURGERY     Cyst removed on left  . WRIST SURGERY Right 1985   tendon repair R wrist    There were no vitals filed for this visit.  Subjective Assessment - 09/23/19 0932    Subjective  Right knee more painful, I believe bad weather is coming. I was out of town this weekend visiting my sister who is in ICU. I am not great at doing my exercises. The back is no better. The knees are the same.    Patient is accompained by:  Interpreter   ALS interpreter   Currently in Pain?  Yes    Pain Score  4     Pain Location  Knee    Pain  Orientation  Right    Pain Descriptors / Indicators  Aching                       [facility] Adult PT Treatment/Exercise - 09/23/19 0001      Knee/Hip Exercises: Stretches   Passive Hamstring Stretch Limitations  seated EOB    Piriformis Stretch Limitations  seated    Gastroc Stretch Limitations  standing lunge    Other Knee/Hip Stretches  standing ADD stretch      Knee/Hip Exercises: Aerobic   Recumbent Bike  L1 x 5 minutes      Knee/Hip Exercises: Standing   Forward Step Up  Right;Left;10 reps;Hand Hold: 1;Step Height: 6"    SLS  SLS and tandem - needs UE touch 30 sec each       Knee/Hip Exercises: Seated   Sit to Sand  10 reps   cues for descent       Knee/Hip Exercises: Supine   Bridges with Clamshell  10 reps   green   Straight Leg Raises  10 reps   2 sets each    Straight Leg Raises Limitations  fatigues with reps              PT Education - 09/23/19 1011    Education Details  HEP    Person(s) Educated  Patient    Methods  Explanation;Handout    Comprehension  Returned demonstration;Other (comment)   ALS interpreter      PT Short Term Goals - 09/23/19 0953      PT SHORT TERM GOAL #1   Title  able to use her stretches during the day to maintain lower levels of pain    Time  3    Period  Weeks    Status  Partially Met        PT Long Term Goals - 09/05/19 0934      PT LONG TERM GOAL #1   Title  Independent with long term HEP    Baseline  began establishing at eval    Time  8    Period  Weeks    Status  New    Target Date  10/31/19      PT LONG TERM GOAL #2   Title  demo SLS at least 5s bilaterally with stable control    Baseline  unable at eval    Time  8    Period  Weeks    Status  New    Target Date  10/31/19      PT LONG TERM GOAL #3   Title  gross LE strength 5/5    Baseline  see flowsheet    Time  8    Period  Weeks    Status  New    Target Date  10/31/19      PT LONG TERM GOAL #4   Title  Pt will be able to stand from her car without increase in knee pain and without LOB    Baseline  pain with freqent falling back into car at eval    Time  8    Period  Weeks    Status  New    Target Date  10/31/19      PT LONG TERM GOAL #5   Title  FOTO to 41% limited    Baseline  52% limited at eval    Time  8    Period  Weeks    Status  New  Target Date  10/31/19            Plan - 09/23/19 1012    Clinical Impression Statement  Pt reports no changes in pain and minimal improvement in pain. Progressed with closed chain LE strengthening and balance. She tolerated session without c/o pain. SHe is unable to release UE support with balance exercises. Updated HEP with balance using couner  support.    PT Next Visit Plan  gross strength, focus HEP- she is not consistent    PT Home Exercise Plan  HSS, piriformis stretch, gastroc stretch, adductor stretch, added SLR, sit-stands, bridge with CLAM (green), added SLS and tandem with UE support.       Patient will benefit from skilled therapeutic intervention in order to improve the following deficits and impairments:  Increased muscle spasms, Decreased activity tolerance, Pain, Difficulty walking, Impaired flexibility, Decreased balance, Decreased strength  Visit Diagnosis: Chronic pain of left knee  Chronic pain of right knee  Muscle weakness (generalized)  Other abnormalities of gait and mobility  Chronic bilateral low back pain without sciatica     Problem List Patient Active Problem List   Diagnosis Date Noted  . Pes anserinus bursitis of both knees 08/02/2019  . Dysgeusia 07/03/2019  . Thumb laceration 06/20/2019  . Great toe pain, right 06/20/2019  . Frequent loose stools 04/20/2019  . Pruritus 03/07/2019  . Hyperthyroidism 03/07/2019  . Hypertension 01/07/2019  . Hypercalcemia 10/08/2018  . Seasonal allergies 08/27/2018  . Osteoarthritis involving multiple joints on both sides of body 05/25/2018  . Statin intolerance 12/26/2017  . PSVT (paroxysmal supraventricular tachycardia) ([facility]) 11/09/2017  . Chest pain 08/30/2017  . Hyperlipidemia 08/30/2017  . OSA (obstructive sleep apnea) 08/31/2016  . Diabetes ([facility]) 09/14/2015  . Muscle pain 09/28/2014  . Seizure ([facility]) 06/08/2014  . Iron deficiency anemia 07/01/2012  . Diabetic retinopathy ([place]) 07/21/2011  . GERD 12/30/2007  . Diabetic polyneuropathy ([facility]) 08/30/2006  . Type 2 diabetes mellitus with ophthalmic complication ([facility]) 20/80/2233  . HYPERCHOLESTEROLEMIA 08/30/2006  . Hearing loss 08/30/2006    Dorene Ar, PTA 09/23/2019, 11:19 AM  [place], [place],  [address] Phone: [phone]   Fax:  [phone]  Name: PORCHIA SINKLER MRN: [id number] Date of Birth: 1956/01/26

## 2019-09-24 ENCOUNTER — Other Ambulatory Visit: Payer: Self-pay | Admitting: Family Medicine

## 2019-09-25 ENCOUNTER — Encounter: Payer: Self-pay | Admitting: Physical Therapy

## 2019-09-25 ENCOUNTER — Ambulatory Visit: Payer: Medicare Other | Admitting: Physical Therapy

## 2019-09-25 ENCOUNTER — Other Ambulatory Visit: Payer: Self-pay

## 2019-09-25 DIAGNOSIS — M25562 Pain in left knee: Secondary | ICD-10-CM | POA: Diagnosis not present

## 2019-09-25 DIAGNOSIS — G8929 Other chronic pain: Secondary | ICD-10-CM | POA: Diagnosis not present

## 2019-09-25 DIAGNOSIS — M6281 Muscle weakness (generalized): Secondary | ICD-10-CM

## 2019-09-25 DIAGNOSIS — M25561 Pain in right knee: Secondary | ICD-10-CM

## 2019-09-25 DIAGNOSIS — R2689 Other abnormalities of gait and mobility: Secondary | ICD-10-CM

## 2019-09-25 DIAGNOSIS — M545 Low back pain: Secondary | ICD-10-CM | POA: Diagnosis not present

## 2019-09-25 NOTE — Therapy (Signed)
[place], [place], [address] Phone: [phone]   Fax:  [phone]  Physical Therapy Treatment  Patient Details  Name: Alicia [name] MRN: [id number] Date of Birth: [birth year] Referring Provider (PT): Dorcas Mcmurray, MD   Encounter Date: 09/25/2019  PT End of Session - 09/25/19 0853    Visit Number  4    Number of Visits  17    Date for PT Re-Evaluation  10/31/19    Authorization Type  MCR- PN at visit 10    PT Start Time  0848    PT Stop Time  0929    PT Time Calculation (min)  41 min       Past Medical History:  Diagnosis Date  . Anemia   . ARTHRITIS, KNEE 09/17/2007  . Asthma 04/04/2010  . Cataract 01/07/2019  . Closed fracture of distal end of left radius 11/01/2015  . Complete deafness    Meningitis at age 64  . Deaf   . Diabetes mellitus   . Diabetic neuropathy ([facility])   . Ganglion cyst 06/22/2011  . Gastroparesis   . GERD (gastroesophageal reflux disease)   . H/O: C-section   . Hyperlipidemia   . Hypertension   . Neuromuscular disorder ([facility])    diabetic neuropathy  . PSVT (paroxysmal supraventricular tachycardia) ([facility]) 11/09/2017   Event monitor 09/14/2017 - Predominantly sinus rhythm with episodes of narrow-complex tachycardia suggestive of paroxysmal supraventricular tachycardia.  . S/P appy     Past Surgical History:  Procedure Laterality Date  . APPENDECTOMY    . cardiolyte EF 77%, no ischemia in 2006  2006  . CARPAL TUNNEL RELEASE Right 04/20/2015   Procedure: RIGHT CARPAL TUNNEL RELEASE;  Surgeon: Daryll Brod, MD;  Location: [facility];  Service: Orthopedics;  Laterality: Right;  . CARPAL TUNNEL RELEASE Left 11/04/2015   Procedure: CARPAL TUNNEL RELEASE;  Surgeon: Daryll Brod, MD;  Location: [facility];  Service: Orthopedics;  Laterality: Left;  . CESAREAN SECTION    . OPEN REDUCTION INTERNAL FIXATION (ORIF) DISTAL RADIAL FRACTURE Left 11/04/2015   Procedure:  OPEN REDUCTION INTERNAL FIXATION (ORIF) LEFT DISTAL RADIAL FRACTURE POSSIBLE BONE GRAFT;  Surgeon: Daryll Brod, MD;  Location: [facility];  Service: Orthopedics;  Laterality: Left;  . TRIGGER FINGER RELEASE Right 04/20/2015   Procedure: RELEASE TRIGGER FINGER/A-1 PULLEY RIGHT MIDDLE FINGER,RIGHT RING FINGER;  Surgeon: Daryll Brod, MD;  Location: [facility];  Service: Orthopedics;  Laterality: Right;  . TUBAL LIGATION    . ULNAR NERVE TRANSPOSITION Right 04/20/2015   Procedure: RIGHT ULNAR NERVE DECOMPRESSION;  Surgeon: Daryll Brod, MD;  Location: [facility];  Service: Orthopedics;  Laterality: Right;  . UTERINE FIBROID EMBOLIZATION  2007  . WRIST SURGERY     Cyst removed on left  . WRIST SURGERY Right 1985   tendon repair R wrist    There were no vitals filed for this visit.  Subjective Assessment - 09/25/19 0851    Subjective  Back was really hurting yesterday, I was very active and it was rainy weather.    Patient is accompained by:  Interpreter    Currently in Pain?  Yes    Pain Score  --   Back pain7-8/10, Knee pain 3-4/10 Right, 2/10LT knee   Pain Location  Knee                       [facility] Adult PT Treatment/Exercise -  09/25/19 0001      Knee/Hip Exercises: Stretches   Other Knee/Hip Stretches  knee to chest 20 sec each x 2      Knee/Hip Exercises: Aerobic   Recumbent Bike  L2 x 5 minutes      Knee/Hip Exercises: Standing   Forward Step Up  Right;Left;15 reps;Hand Hold: 1;Step Height: 6"    SLS  SLS needs UE , Tandem 6 sec best    in parallel bars    Gait Training  tandem gait with light touch- cues required to catch herself before loosing balance into parallel bars     Other Standing Knee Exercises  narrow stance on foam withoout UE- 45 sec       Knee/Hip Exercises: Seated   Sit to Sand  10 reps   cues for descent      Knee/Hip Exercises: Supine   Bridges with Clamshell  10 reps   green   Straight Leg Raises   10 reps   2 sets each    Straight Leg Raises Limitations  fatigues with reps       Knee/Hip Exercises: Sidelying   Hip ABduction  Both;10 reps               PT Short Term Goals - 09/25/19 0955      PT SHORT TERM GOAL #1   Title  able to use her stretches during the day to maintain lower levels of pain    Baseline  utilizing but reports no change in pain    Time  3    Period  Weeks    Status  Partially Met        PT Long Term Goals - 09/05/19 0934      PT LONG TERM GOAL #1   Title  Independent with long term HEP    Baseline  began establishing at eval    Time  8    Period  Weeks    Status  New    Target Date  10/31/19      PT LONG TERM GOAL #2   Title  demo SLS at least 5s bilaterally with stable control    Baseline  unable at eval    Time  8    Period  Weeks    Status  New    Target Date  10/31/19      PT LONG TERM GOAL #3   Title  gross LE strength 5/5    Baseline  see flowsheet    Time  8    Period  Weeks    Status  New    Target Date  10/31/19      PT LONG TERM GOAL #4   Title  Pt will be able to stand from her car without increase in knee pain and without LOB    Baseline  pain with freqent falling back into car at eval    Time  8    Period  Weeks    Status  New    Target Date  10/31/19      PT LONG TERM GOAL #5   Title  FOTO to 41% limited    Baseline  52% limited at eval    Time  8    Period  Weeks    Status  New    Target Date  10/31/19            Plan - 09/25/19 0953    Clinical Impression Statement  Pt reports  increased back pain yesterday and today. Less Knee pain. She reports her pain comes and goes in hips, knees and back as well as great toe. Education provided on tissue healing and chronic pain. Encouraged her to retrun to a physical activuty she enjoys like walking outdoors. She reports no improvement in pain and she reports attempting stretches/ HEP when in pain without improvement. No STGs met. Increased balance  challenges with good tolerance, needs cues to use parallel bars to prevent LOB as needed. She tends to allow herself to fall into forearms on parallel bars instead of using her hands.    PT Next Visit Plan  gross strength, focus HEP- she is not consistent    PT Home Exercise Plan  HSS, piriformis stretch, gastroc stretch, adductor stretch, added SLR, sit-stands, bridge with CLAM (green), added SLS and tandem with UE support.       Patient will benefit from skilled therapeutic intervention in order to improve the following deficits and impairments:  Increased muscle spasms, Decreased activity tolerance, Pain, Difficulty walking, Impaired flexibility, Decreased balance, Decreased strength  Visit Diagnosis: Chronic pain of right knee  Chronic pain of left knee  Muscle weakness (generalized)  Other abnormalities of gait and mobility  Chronic bilateral low back pain without sciatica     Problem List Patient Active Problem List   Diagnosis Date Noted  . Pes anserinus bursitis of both knees 08/02/2019  . Dysgeusia 07/03/2019  . Thumb laceration 06/20/2019  . Great toe pain, right 06/20/2019  . Frequent loose stools 04/20/2019  . Pruritus 03/07/2019  . Hyperthyroidism 03/07/2019  . Hypertension 01/07/2019  . Hypercalcemia 10/08/2018  . Seasonal allergies 08/27/2018  . Osteoarthritis involving multiple joints on both sides of body 05/25/2018  . Statin intolerance 12/26/2017  . PSVT (paroxysmal supraventricular tachycardia) ([facility]) 11/09/2017  . Chest pain 08/30/2017  . Hyperlipidemia 08/30/2017  . OSA (obstructive sleep apnea) 08/31/2016  . Diabetes ([facility]) 09/14/2015  . Muscle pain 09/28/2014  . Seizure ([facility]) 06/08/2014  . Iron deficiency anemia 07/01/2012  . Diabetic retinopathy ([facility]) 07/21/2011  . GERD 12/30/2007  . Diabetic polyneuropathy ([facility]) 08/30/2006  . Type 2 diabetes mellitus with ophthalmic complication ([facility]) 50/56/9794  . HYPERCHOLESTEROLEMIA 08/30/2006  . Hearing  loss 08/30/2006    Dorene Ar, PTA 09/25/2019, 9:59 AM  [place], [place], [address] Phone: [phone]   Fax:  [phone]  Name: ARMIYAH CAPRON MRN: [id number] Date of Birth: [birth year]

## 2019-09-27 ENCOUNTER — Ambulatory Visit: Payer: Medicare Other | Attending: Internal Medicine

## 2019-09-27 DIAGNOSIS — Z23 Encounter for immunization: Secondary | ICD-10-CM

## 2019-09-27 NOTE — Progress Notes (Signed)
   Covid-19 Vaccination Clinic  Name:  Alicia [name]    MRN: [id number] DOB: [birth year]  09/27/2019  Ms. Alicia [name] was observed post Covid-19 immunization for 15 minutes without incident. She was provided with Vaccine Information Sheet and instruction to access the V-Safe system.   Ms. Alicia [name] was instructed to call 911 with any severe reactions post vaccine: Marland Kitchen Difficulty breathing  . Swelling of face and throat  . A fast heartbeat  . A bad rash all over body  . Dizziness and weakness   Immunizations Administered    Name Date Dose VIS Date Route   Pfizer COVID-19 Vaccine 09/27/2019  9:08 AM 0.3 mL 06/13/2019 Intramuscular   Manufacturer: [place]   Lot: [id number]   [place]: [id number]

## 2019-09-30 DIAGNOSIS — H35371 Puckering of macula, right eye: Secondary | ICD-10-CM | POA: Diagnosis not present

## 2019-09-30 DIAGNOSIS — E103513 Type 1 diabetes mellitus with proliferative diabetic retinopathy with macular edema, bilateral: Secondary | ICD-10-CM | POA: Diagnosis not present

## 2019-09-30 DIAGNOSIS — H35031 Hypertensive retinopathy, right eye: Secondary | ICD-10-CM | POA: Diagnosis not present

## 2019-09-30 DIAGNOSIS — H35363 Drusen (degenerative) of macula, bilateral: Secondary | ICD-10-CM | POA: Diagnosis not present

## 2019-10-02 ENCOUNTER — Other Ambulatory Visit: Payer: Self-pay | Admitting: Family Medicine

## 2019-10-02 DIAGNOSIS — E119 Type 2 diabetes mellitus without complications: Secondary | ICD-10-CM

## 2019-10-05 ENCOUNTER — Other Ambulatory Visit: Payer: Self-pay | Admitting: Family Medicine

## 2019-10-05 DIAGNOSIS — M159 Polyosteoarthritis, unspecified: Secondary | ICD-10-CM

## 2019-10-07 ENCOUNTER — Ambulatory Visit: Payer: Medicare Other | Attending: Family Medicine | Admitting: Physical Therapy

## 2019-10-07 ENCOUNTER — Encounter: Payer: Self-pay | Admitting: Physical Therapy

## 2019-10-07 ENCOUNTER — Other Ambulatory Visit: Payer: Self-pay

## 2019-10-07 DIAGNOSIS — M545 Low back pain: Secondary | ICD-10-CM | POA: Insufficient documentation

## 2019-10-07 DIAGNOSIS — M6281 Muscle weakness (generalized): Secondary | ICD-10-CM

## 2019-10-07 DIAGNOSIS — M25562 Pain in left knee: Secondary | ICD-10-CM | POA: Diagnosis not present

## 2019-10-07 DIAGNOSIS — M25561 Pain in right knee: Secondary | ICD-10-CM | POA: Insufficient documentation

## 2019-10-07 DIAGNOSIS — G8929 Other chronic pain: Secondary | ICD-10-CM

## 2019-10-07 DIAGNOSIS — R2689 Other abnormalities of gait and mobility: Secondary | ICD-10-CM | POA: Diagnosis not present

## 2019-10-07 NOTE — Therapy (Signed)
[place], [place], [address] Phone: [phone]   Fax:  [phone]  Physical Therapy Treatment  Patient Details  Name: Alicia [name] MRN: [id number] Date of Birth: 1955/10/30 Referring Provider (PT): Dorcas Mcmurray, MD   Encounter Date: 10/07/2019  PT End of Session - 10/07/19 0857    Visit Number  5    Number of Visits  17    Date for PT Re-Evaluation  10/31/19    Authorization Type  MCR- PN at visit 10    PT Start Time  0847    PT Stop Time  0931    PT Time Calculation (min)  44 min       Past Medical History:  Diagnosis Date  . Anemia   . ARTHRITIS, KNEE 09/17/2007  . Asthma 04/04/2010  . Cataract 01/07/2019  . Closed fracture of distal end of left radius 11/01/2015  . Complete deafness    Meningitis at age 59  . Deaf   . Diabetes mellitus   . Diabetic neuropathy ([facility])   . Ganglion cyst 06/22/2011  . Gastroparesis   . GERD (gastroesophageal reflux disease)   . H/O: C-section   . Hyperlipidemia   . Hypertension   . Neuromuscular disorder ([facility])    diabetic neuropathy  . PSVT (paroxysmal supraventricular tachycardia) ([facility]) 11/09/2017   Event monitor 09/14/2017 - Predominantly sinus rhythm with episodes of narrow-complex tachycardia suggestive of paroxysmal supraventricular tachycardia.  . S/P appy     Past Surgical History:  Procedure Laterality Date  . APPENDECTOMY    . cardiolyte EF 77%, no ischemia in 2006  2006  . CARPAL TUNNEL RELEASE Right 04/20/2015   Procedure: RIGHT CARPAL TUNNEL RELEASE;  Surgeon: Daryll Brod, MD;  Location: [facility];  Service: Orthopedics;  Laterality: Right;  . CARPAL TUNNEL RELEASE Left 11/04/2015   Procedure: CARPAL TUNNEL RELEASE;  Surgeon: Daryll Brod, MD;  Location: [facility];  Service: Orthopedics;  Laterality: Left;  . CESAREAN SECTION    . OPEN REDUCTION INTERNAL FIXATION (ORIF) DISTAL RADIAL FRACTURE Left 11/04/2015   Procedure:  OPEN REDUCTION INTERNAL FIXATION (ORIF) LEFT DISTAL RADIAL FRACTURE POSSIBLE BONE GRAFT;  Surgeon: Daryll Brod, MD;  Location: [facility];  Service: Orthopedics;  Laterality: Left;  . TRIGGER FINGER RELEASE Right 04/20/2015   Procedure: RELEASE TRIGGER FINGER/A-1 PULLEY RIGHT MIDDLE FINGER,RIGHT RING FINGER;  Surgeon: Daryll Brod, MD;  Location: [facility];  Service: Orthopedics;  Laterality: Right;  . TUBAL LIGATION    . ULNAR NERVE TRANSPOSITION Right 04/20/2015   Procedure: RIGHT ULNAR NERVE DECOMPRESSION;  Surgeon: Daryll Brod, MD;  Location: [facility];  Service: Orthopedics;  Laterality: Right;  . UTERINE FIBROID EMBOLIZATION  2007  . WRIST SURGERY     Cyst removed on left  . WRIST SURGERY Right 1985   tendon repair R wrist    There were no vitals filed for this visit.  Subjective Assessment - 10/07/19 0850    Subjective  I am doing so so. The back is worse. The left knee is throb, throb throb. Sometimes the right knee. Back and forth. The back and knees were really painful this weekend. The pain does not let up.    Patient is accompained by:  Interpreter    Currently in Pain?  Yes   5/10 back , aching, constant   Pain Score  1    averages 5/10   Pain Location  Knee  Pain Orientation  Left;Right    Pain Descriptors / Indicators  Aching    Aggravating Factors   sit-stand, walking         [facility] PT Assessment - 10/07/19 0001      Strength   Right Hip Flexion  4/5    Left Hip Flexion  4/5                   [facility] Adult PT Treatment/Exercise - 10/07/19 0001      Self-Care   Self-Care  Other Self-Care Comments    Other Self-Care Comments   15 minutes: Spine degeneration/chronic pain, How PT can help manage pain, benefits of HEP, benefits of morning stretching before rise-extra time needed for interpreter       Knee/Hip Exercises: Stretches   Hip Flexor Stretch  2 reps;20 seconds    Hip Flexor Stretch Limitations  edge  of mat       Knee/Hip Exercises: Aerobic   Recumbent Bike  --      Knee/Hip Exercises: Supine   Bridges  10 reps    Straight Leg Raises  15 reps    Straight Leg Raises Limitations  cues for abdominal draw in, improved endurance to reps     Other Supine Knee/Hip Exercises  pelvic tilit x 10 cues for technique       Knee/Hip Exercises: Sidelying   Hip ABduction  15 reps    Hip ABduction Limitations  needs pillow under hip - diffiult with sidelying,                PT Short Term Goals - 10/07/19 0950      PT SHORT TERM GOAL #1   Title  able to use her stretches during the day to maintain lower levels of pain    Baseline  utilizing but reports no change in pain    Time  3    Period  Weeks    Status  Partially Met    Target Date  09/26/19        PT Long Term Goals - 09/05/19 0934      PT LONG TERM GOAL #1   Title  Independent with long term HEP    Baseline  began establishing at eval    Time  8    Period  Weeks    Status  New    Target Date  10/31/19      PT LONG TERM GOAL #2   Title  demo SLS at least 5s bilaterally with stable control    Baseline  unable at eval    Time  8    Period  Weeks    Status  New    Target Date  10/31/19      PT LONG TERM GOAL #3   Title  gross LE strength 5/5    Baseline  see flowsheet    Time  8    Period  Weeks    Status  New    Target Date  10/31/19      PT LONG TERM GOAL #4   Title  Pt will be able to stand from her car without increase in knee pain and without LOB    Baseline  pain with freqent falling back into car at eval    Time  8    Period  Weeks    Status  New    Target Date  10/31/19      PT LONG TERM GOAL #5  Title  FOTO to 41% limited    Baseline  52% limited at eval    Time  8    Period  Weeks    Status  New    Target Date  10/31/19            Plan - 10/07/19 0920    Clinical Impression Statement  Pt reports no change in pain. She reports compliance with HEP. 15 minutes spent with Self Care  and discussion of how PT can help with Chronic Pain and what role she needs to play in managing her pain. She demonstrates improved tolerance to reps with hip strength. She reports continued knee pain with getting out of car and she has not had LOB because she turns to hold the car door for assistance (she reports she has never had LOB back into car per LTG#4)    PT Next Visit Plan  gross strength, focus streamlined HEP- she is not consistent    PT Home Exercise Plan  HSS, piriformis stretch, gastroc stretch, adductor stretch, added SLR, sit-stands, bridge with CLAM (green), added SLS and tandem with UE support.       Patient will benefit from skilled therapeutic intervention in order to improve the following deficits and impairments:  Increased muscle spasms, Decreased activity tolerance, Pain, Difficulty walking, Impaired flexibility, Decreased balance, Decreased strength  Visit Diagnosis: Chronic pain of left knee  Chronic pain of right knee  Muscle weakness (generalized)  Other abnormalities of gait and mobility  Chronic bilateral low back pain without sciatica     Problem List Patient Active Problem List   Diagnosis Date Noted  . Pes anserinus bursitis of both knees 08/02/2019  . Dysgeusia 07/03/2019  . Thumb laceration 06/20/2019  . Great toe pain, right 06/20/2019  . Frequent loose stools 04/20/2019  . Pruritus 03/07/2019  . Hyperthyroidism 03/07/2019  . Hypertension 01/07/2019  . Hypercalcemia 10/08/2018  . Seasonal allergies 08/27/2018  . Osteoarthritis involving multiple joints on both sides of body 05/25/2018  . Statin intolerance 12/26/2017  . PSVT (paroxysmal supraventricular tachycardia) ([facility]) 11/09/2017  . Chest pain 08/30/2017  . Hyperlipidemia 08/30/2017  . OSA (obstructive sleep apnea) 08/31/2016  . Diabetes ([facility]) 09/14/2015  . Muscle pain 09/28/2014  . Seizure ([facility]) 06/08/2014  . Iron deficiency anemia 07/01/2012  . Diabetic retinopathy ([facility]) 07/21/2011   . GERD 12/30/2007  . Diabetic polyneuropathy ([facility]) 08/30/2006  . Type 2 diabetes mellitus with ophthalmic complication ([facility]) 57/90/3833  . HYPERCHOLESTEROLEMIA 08/30/2006  . Hearing loss 08/30/2006    Dorene Ar, PTA 10/07/2019, 10:16 AM  [place], [place], [address] Phone: [phone]   Fax:  [phone]  Name: Alicia [name] MRN: [id number] Date of Birth: Nov 18, 1955

## 2019-10-09 ENCOUNTER — Encounter: Payer: Medicare Other | Admitting: Physical Therapy

## 2019-10-10 ENCOUNTER — Other Ambulatory Visit: Payer: Self-pay

## 2019-10-10 ENCOUNTER — Encounter: Payer: Self-pay | Admitting: Physical Therapy

## 2019-10-10 ENCOUNTER — Ambulatory Visit: Payer: Medicare Other | Admitting: Physical Therapy

## 2019-10-10 DIAGNOSIS — M545 Low back pain, unspecified: Secondary | ICD-10-CM

## 2019-10-10 DIAGNOSIS — M25562 Pain in left knee: Secondary | ICD-10-CM

## 2019-10-10 DIAGNOSIS — R2689 Other abnormalities of gait and mobility: Secondary | ICD-10-CM | POA: Diagnosis not present

## 2019-10-10 DIAGNOSIS — G8929 Other chronic pain: Secondary | ICD-10-CM

## 2019-10-10 DIAGNOSIS — M25561 Pain in right knee: Secondary | ICD-10-CM

## 2019-10-10 DIAGNOSIS — M6281 Muscle weakness (generalized): Secondary | ICD-10-CM | POA: Diagnosis not present

## 2019-10-10 NOTE — Therapy (Signed)
[place], [place], [address] Phone: [phone]   Fax:  [phone]  Physical Therapy Treatment  Patient Details  Name: Alicia [name] MRN: [id number] Date of Birth: 1955-10-20 Referring Provider (PT): Dorcas Mcmurray, MD   Encounter Date: 10/10/2019  PT End of Session - 10/10/19 1110    Visit Number  6    Number of Visits  17    Date for PT Re-Evaluation  10/31/19    Authorization Type  MCR- PN at visit 10    PT Start Time  0920    PT Stop Time  1003    PT Time Calculation (min)  43 min    Activity Tolerance  Patient tolerated treatment well       Past Medical History:  Diagnosis Date  . Anemia   . ARTHRITIS, KNEE 09/17/2007  . Asthma 04/04/2010  . Cataract 01/07/2019  . Closed fracture of distal end of left radius 11/01/2015  . Complete deafness    Meningitis at age 62  . Deaf   . Diabetes mellitus   . Diabetic neuropathy ([place])   . Ganglion cyst 06/22/2011  . Gastroparesis   . GERD (gastroesophageal reflux disease)   . H/O: C-section   . Hyperlipidemia   . Hypertension   . Neuromuscular disorder ([place])    diabetic neuropathy  . PSVT (paroxysmal supraventricular tachycardia) ([place]) 11/09/2017   Event monitor 09/14/2017 - Predominantly sinus rhythm with episodes of narrow-complex tachycardia suggestive of paroxysmal supraventricular tachycardia.  . S/P appy     Past Surgical History:  Procedure Laterality Date  . APPENDECTOMY    . cardiolyte EF 77%, no ischemia in 2006  2006  . CARPAL TUNNEL RELEASE Right 04/20/2015   Procedure: RIGHT CARPAL TUNNEL RELEASE;  Surgeon: Daryll Brod, MD;  Location: [facility];  Service: Orthopedics;  Laterality: Right;  . CARPAL TUNNEL RELEASE Left 11/04/2015   Procedure: CARPAL TUNNEL RELEASE;  Surgeon: Daryll Brod, MD;  Location: [facility];  Service: Orthopedics;  Laterality: Left;  . CESAREAN SECTION    . OPEN REDUCTION INTERNAL FIXATION  (ORIF) DISTAL RADIAL FRACTURE Left 11/04/2015   Procedure: OPEN REDUCTION INTERNAL FIXATION (ORIF) LEFT DISTAL RADIAL FRACTURE POSSIBLE BONE GRAFT;  Surgeon: Daryll Brod, MD;  Location: [facility];  Service: Orthopedics;  Laterality: Left;  . TRIGGER FINGER RELEASE Right 04/20/2015   Procedure: RELEASE TRIGGER FINGER/A-1 PULLEY RIGHT MIDDLE FINGER,RIGHT RING FINGER;  Surgeon: Daryll Brod, MD;  Location: [facility];  Service: Orthopedics;  Laterality: Right;  . TUBAL LIGATION    . ULNAR NERVE TRANSPOSITION Right 04/20/2015   Procedure: RIGHT ULNAR NERVE DECOMPRESSION;  Surgeon: Daryll Brod, MD;  Location: [facility];  Service: Orthopedics;  Laterality: Right;  . UTERINE FIBROID EMBOLIZATION  2007  . WRIST SURGERY     Cyst removed on left  . WRIST SURGERY Right 1985   tendon repair R wrist    There were no vitals filed for this visit.  Subjective Assessment - 10/10/19 1105    Subjective  Knee pain is not too bad today.I want to stretch . My shoulder started hurting when I walked in    Currently in Pain?  Yes    Pain Score  2     Pain Location  Knee    Pain Orientation  Right;Left    Pain Descriptors / Indicators  Aching    Pain Type  Chronic pain    Pain Onset  More than a month ago    Pain Frequency  Intermittent         [facility] Adult PT Treatment/Exercise - 10/10/19 0001      Knee/Hip Exercises: Stretches   Piriformis Stretch  Both;3 reps;30 seconds    Other Knee/Hip Stretches  lower trunk rot x 10 between sets     Other Knee/Hip Stretches  knee to chest 20 sec each x 2      Knee/Hip Exercises: Aerobic   Nustep  L4 UE and LE x 5 min       Knee/Hip Exercises: Seated   Sit to Sand  3 sets;10 reps;without UE support;Other (comment)   elevated with Airex, focus on eccentric.       Knee/Hip Exercises: Supine   Bridges  2 sets;10 reps    Straight Leg Raises  15 reps    Other Supine Knee/Hip Exercises  clam red band x 10 then add brdge  with clam x 10 red band       Knee/Hip Exercises: Sidelying   Clams  red band x 15              PT Education - 10/10/19 1110    Education Details  sit to stand    Person(s) Educated  Patient    Methods  Explanation    Comprehension  Verbalized understanding       PT Short Term Goals - 10/07/19 0950      PT SHORT TERM GOAL #1   Title  able to use her stretches during the day to maintain lower levels of pain    Baseline  utilizing but reports no change in pain    Time  3    Period  Weeks    Status  Partially Met    Target Date  09/26/19        PT Long Term Goals - 09/05/19 0934      PT LONG TERM GOAL #1   Title  Independent with long term HEP    Baseline  began establishing at eval    Time  8    Period  Weeks    Status  New    Target Date  10/31/19      PT LONG TERM GOAL #2   Title  demo SLS at least 5s bilaterally with stable control    Baseline  unable at eval    Time  8    Period  Weeks    Status  New    Target Date  10/31/19      PT LONG TERM GOAL #3   Title  gross LE strength 5/5    Baseline  see flowsheet    Time  8    Period  Weeks    Status  New    Target Date  10/31/19      PT LONG TERM GOAL #4   Title  Pt will be able to stand from her car without increase in knee pain and without LOB    Baseline  pain with freqent falling back into car at eval    Time  8    Period  Weeks    Status  New    Target Date  10/31/19      PT LONG TERM GOAL #5   Title  FOTO to 41% limited    Baseline  52% limited at eval    Time  8    Period  Weeks    Status  New  Target Date  10/31/19            Plan - 10/10/19 9702    Clinical Impression Statement  Patient did not complain of pain during session.  She had difficulty with sit to stand without hands, pt unable to break habit of reaching back, had 2 mild LOB posteriorly.  Also reported L shoulder pain as she began session as well, did not address today.    PT Treatment/Interventions  ADLs/Self  Care Home Management;Cryotherapy;Electrical Stimulation;Iontophoresis '4mg'$ /ml Dexamethasone;Functional mobility training;Stair training;Gait training;Ultrasound;Traction;Moist Heat;Therapeutic activities;Therapeutic exercise;Balance training;Neuromuscular re-education;Patient/family education;Passive range of motion;Manual techniques;Dry needling;Taping    PT Next Visit Plan  gross strength, focus streamlined HEP- sit to stand and light balance in corner?    PT Home Exercise Plan  HSS, piriformis stretch, gastroc stretch, adductor stretch, added SLR, sit-stands, bridge with CLAM (red/green), added SLS and tandem with UE support.    Consulted and Agree with Plan of Care  Patient       Patient will benefit from skilled therapeutic intervention in order to improve the following deficits and impairments:  Increased muscle spasms, Decreased activity tolerance, Pain, Difficulty walking, Impaired flexibility, Decreased balance, Decreased strength  Visit Diagnosis: Chronic pain of left knee  Chronic pain of right knee  Muscle weakness (generalized)  Chronic bilateral low back pain without sciatica  Other abnormalities of gait and mobility     Problem List Patient Active Problem List   Diagnosis Date Noted  . Pes anserinus bursitis of both knees 08/02/2019  . Dysgeusia 07/03/2019  . Thumb laceration 06/20/2019  . Great toe pain, right 06/20/2019  . Frequent loose stools 04/20/2019  . Pruritus 03/07/2019  . Hyperthyroidism 03/07/2019  . Hypertension 01/07/2019  . Hypercalcemia 10/08/2018  . Seasonal allergies 08/27/2018  . Osteoarthritis involving multiple joints on both sides of body 05/25/2018  . Statin intolerance 12/26/2017  . PSVT (paroxysmal supraventricular tachycardia) ([place]) 11/09/2017  . Chest pain 08/30/2017  . Hyperlipidemia 08/30/2017  . OSA (obstructive sleep apnea) 08/31/2016  . Diabetes ([place]) 09/14/2015  . Muscle pain 09/28/2014  . Seizure ([place]) 06/08/2014  . Iron  deficiency anemia 07/01/2012  . Diabetic retinopathy ([place]) 07/21/2011  . GERD 12/30/2007  . Diabetic polyneuropathy ([place]) 08/30/2006  . Type 2 diabetes mellitus with ophthalmic complication ([place]) 63/78/5885  . HYPERCHOLESTEROLEMIA 08/30/2006  . Hearing loss 08/30/2006    Kishawn Pickar 10/10/2019, 11:12 AM  [place], [place], [address] Phone: [phone]   Fax:  [phone]  Name: Alicia [name] MRN: [id number] Date of Birth: 12/19/1955  Raeford Razor, PT 10/10/19 11:12 AM Phone: [phone] Fax: [phone]

## 2019-10-14 ENCOUNTER — Ambulatory Visit: Payer: Medicare Other | Admitting: Physical Therapy

## 2019-10-14 ENCOUNTER — Other Ambulatory Visit: Payer: Self-pay

## 2019-10-14 ENCOUNTER — Encounter: Payer: Self-pay | Admitting: Physical Therapy

## 2019-10-14 DIAGNOSIS — M25561 Pain in right knee: Secondary | ICD-10-CM | POA: Diagnosis not present

## 2019-10-14 DIAGNOSIS — M25562 Pain in left knee: Secondary | ICD-10-CM

## 2019-10-14 DIAGNOSIS — M545 Low back pain, unspecified: Secondary | ICD-10-CM

## 2019-10-14 DIAGNOSIS — G8929 Other chronic pain: Secondary | ICD-10-CM

## 2019-10-14 DIAGNOSIS — R2689 Other abnormalities of gait and mobility: Secondary | ICD-10-CM

## 2019-10-14 DIAGNOSIS — M6281 Muscle weakness (generalized): Secondary | ICD-10-CM | POA: Diagnosis not present

## 2019-10-14 NOTE — Therapy (Signed)
[place], [place], [address] Phone: [phone]   Fax:  [phone]  Physical Therapy Treatment  Patient Details  Name: Alicia [name] MRN: [id number] Date of Birth: 10-24-1955 Referring Provider (PT): Dorcas Mcmurray, MD   Encounter Date: 10/14/2019  PT End of Session - 10/14/19 0932    Visit Number  7    Number of Visits  17    Date for PT Re-Evaluation  10/31/19    Authorization Type  MCR- PN at visit 10    PT Start Time  0921    PT Stop Time  1013    PT Time Calculation (min)  52 min    Activity Tolerance  Patient tolerated treatment well    Behavior During Therapy  [facility] for tasks assessed/performed       Past Medical History:  Diagnosis Date  . Anemia   . ARTHRITIS, KNEE 09/17/2007  . Asthma 04/04/2010  . Cataract 01/07/2019  . Closed fracture of distal end of left radius 11/01/2015  . Complete deafness    Meningitis at age 38  . Deaf   . Diabetes mellitus   . Diabetic neuropathy ([place])   . Ganglion cyst 06/22/2011  . Gastroparesis   . GERD (gastroesophageal reflux disease)   . H/O: C-section   . Hyperlipidemia   . Hypertension   . Neuromuscular disorder ([place])    diabetic neuropathy  . PSVT (paroxysmal supraventricular tachycardia) ([place]) 11/09/2017   Event monitor 09/14/2017 - Predominantly sinus rhythm with episodes of narrow-complex tachycardia suggestive of paroxysmal supraventricular tachycardia.  . S/P appy     Past Surgical History:  Procedure Laterality Date  . APPENDECTOMY    . cardiolyte EF 77%, no ischemia in 2006  2006  . CARPAL TUNNEL RELEASE Right 04/20/2015   Procedure: RIGHT CARPAL TUNNEL RELEASE;  Surgeon: Daryll Brod, MD;  Location: [facility];  Service: Orthopedics;  Laterality: Right;  . CARPAL TUNNEL RELEASE Left 11/04/2015   Procedure: CARPAL TUNNEL RELEASE;  Surgeon: Daryll Brod, MD;  Location: [facility];  Service: Orthopedics;  Laterality: Left;   . CESAREAN SECTION    . OPEN REDUCTION INTERNAL FIXATION (ORIF) DISTAL RADIAL FRACTURE Left 11/04/2015   Procedure: OPEN REDUCTION INTERNAL FIXATION (ORIF) LEFT DISTAL RADIAL FRACTURE POSSIBLE BONE GRAFT;  Surgeon: Daryll Brod, MD;  Location: [facility];  Service: Orthopedics;  Laterality: Left;  . TRIGGER FINGER RELEASE Right 04/20/2015   Procedure: RELEASE TRIGGER FINGER/A-1 PULLEY RIGHT MIDDLE FINGER,RIGHT RING FINGER;  Surgeon: Daryll Brod, MD;  Location: [facility];  Service: Orthopedics;  Laterality: Right;  . TUBAL LIGATION    . ULNAR NERVE TRANSPOSITION Right 04/20/2015   Procedure: RIGHT ULNAR NERVE DECOMPRESSION;  Surgeon: Daryll Brod, MD;  Location: [facility];  Service: Orthopedics;  Laterality: Right;  . UTERINE FIBROID EMBOLIZATION  2007  . WRIST SURGERY     Cyst removed on left  . WRIST SURGERY Right 1985   tendon repair R wrist    There were no vitals filed for this visit.  Subjective Assessment - 10/14/19 0931    Subjective  Sore all over, did my exercises yesterday.    Currently in Pain?  Yes    Pain Score  4     Pain Location  --   waist down           [facility] Adult PT Treatment/Exercise - 10/14/19 0001      Lumbar Exercises: Stretches  Lower Trunk Rotation Limitations  knees crossed gentle x 5, 10 sec hold     Standing Side Bend Limitations  seated x 2, 20 sec     Active Hamstring Stretch  Both;2 reps;30 seconds    Piriformis Stretch  Both;3 reps;30 seconds    Piriformis Stretch Limitations  seated       Knee/Hip Exercises: Aerobic   Nustep  L4 UE and LE x 6 min       Knee/Hip Exercises: Standing   Functional Squat  10 reps    Functional Squat Limitations  at sink for hip mobility    Other Standing Knee Exercises  sink stretch for lower back x 3       Knee/Hip Exercises: Supine   Bridges with Ball Squeeze  Strengthening;Both;2 sets;10 reps   heels dig x 10     Knee/Hip Exercises: Sidelying   Hip ABduction   Strengthening;Both;2 sets;10 reps      Modalities   Modalities  Moist Heat      Moist Heat Therapy   Number Minutes Moist Heat  10 Minutes    Moist Heat Location  Lumbar Spine;Hip             PT Education - 10/14/19 1314    Education Details  back pain and how it affects QOL , limits activity and constribute to deconditioning    Person(s) Educated  Patient    Methods  Explanation    Comprehension  Verbalized understanding       PT Short Term Goals - 10/07/19 0950      PT SHORT TERM GOAL #1   Title  able to use her stretches during the day to maintain lower levels of pain    Baseline  utilizing but reports no change in pain    Time  3    Period  Weeks    Status  Partially Met    Target Date  09/26/19        PT Long Term Goals - 09/05/19 0934      PT LONG TERM GOAL #1   Title  Independent with long term HEP    Baseline  began establishing at eval    Time  8    Period  Weeks    Status  New    Target Date  10/31/19      PT LONG TERM GOAL #2   Title  demo SLS at least 5s bilaterally with stable control    Baseline  unable at eval    Time  8    Period  Weeks    Status  New    Target Date  10/31/19      PT LONG TERM GOAL #3   Title  gross LE strength 5/5    Baseline  see flowsheet    Time  8    Period  Weeks    Status  New    Target Date  10/31/19      PT LONG TERM GOAL #4   Title  Pt will be able to stand from her car without increase in knee pain and without LOB    Baseline  pain with freqent falling back into car at eval    Time  8    Period  Weeks    Status  New    Target Date  10/31/19      PT LONG TERM GOAL #5   Title  FOTO to 41% limited    Baseline  52%  limited at eval    Time  8    Period  Weeks    Status  New    Target Date  10/31/19            Plan - 10/14/19 0932    Clinical Impression Statement  Pt able to perform exercises with slight increase in back pain .  She was told she could do nothing for her back.  Cont to work  on general mobility and LE /core strength .    PT Treatment/Interventions  ADLs/Self Care Home Management;Cryotherapy;Electrical Stimulation;Iontophoresis '4mg'$ /ml Dexamethasone;Functional mobility training;Stair training;Gait training;Ultrasound;Traction;Moist Heat;Therapeutic activities;Therapeutic exercise;Balance training;Neuromuscular re-education;Patient/family education;Passive range of motion;Manual techniques;Dry needling;Taping    PT Next Visit Plan  gross strength, focus streamlined HEP- sit to stand and light balance in corner?    PT Home Exercise Plan  HSS, piriformis stretch, gastroc stretch, adductor stretch, added SLR, sit-stands, bridge with CLAM (red/green), added SLS and tandem with UE support.    Consulted and Agree with Plan of Care  Patient       Patient will benefit from skilled therapeutic intervention in order to improve the following deficits and impairments:  Increased muscle spasms, Decreased activity tolerance, Pain, Difficulty walking, Impaired flexibility, Decreased balance, Decreased strength  Visit Diagnosis: Chronic pain of left knee  Chronic pain of right knee  Muscle weakness (generalized)  Chronic bilateral low back pain without sciatica  Other abnormalities of gait and mobility     Problem List Patient Active Problem List   Diagnosis Date Noted  . Pes anserinus bursitis of both knees 08/02/2019  . Dysgeusia 07/03/2019  . Thumb laceration 06/20/2019  . Great toe pain, right 06/20/2019  . Frequent loose stools 04/20/2019  . Pruritus 03/07/2019  . Hyperthyroidism 03/07/2019  . Hypertension 01/07/2019  . Hypercalcemia 10/08/2018  . Seasonal allergies 08/27/2018  . Osteoarthritis involving multiple joints on both sides of body 05/25/2018  . Statin intolerance 12/26/2017  . PSVT (paroxysmal supraventricular tachycardia) ([place]) 11/09/2017  . Chest pain 08/30/2017  . Hyperlipidemia 08/30/2017  . OSA (obstructive sleep apnea) 08/31/2016  . Diabetes  ([place]) 09/14/2015  . Muscle pain 09/28/2014  . Seizure ([place]) 06/08/2014  . Iron deficiency anemia 07/01/2012  . Diabetic retinopathy ([place]) 07/21/2011  . GERD 12/30/2007  . Diabetic polyneuropathy ([place]) 08/30/2006  . Type 2 diabetes mellitus with ophthalmic complication ([place]) 38/37/7939  . HYPERCHOLESTEROLEMIA 08/30/2006  . Hearing loss 08/30/2006    Rodgers Likes 10/14/2019, 1:17 PM  [place], [place], [address] Phone: [phone]   Fax:  [phone]  Name: MACLOVIA UHER MRN: [id number] Date of Birth: [birth year]  Raeford Razor, PT 10/14/19 1:17 PM Phone: [phone] Fax: [phone]

## 2019-10-16 ENCOUNTER — Ambulatory Visit: Payer: Medicare Other | Admitting: Physical Therapy

## 2019-10-16 ENCOUNTER — Other Ambulatory Visit: Payer: Self-pay

## 2019-10-16 ENCOUNTER — Other Ambulatory Visit: Payer: Self-pay | Admitting: Student in an Organized Health Care Education/Training Program

## 2019-10-16 ENCOUNTER — Encounter: Payer: Self-pay | Admitting: Physical Therapy

## 2019-10-16 DIAGNOSIS — M545 Low back pain, unspecified: Secondary | ICD-10-CM

## 2019-10-16 DIAGNOSIS — G8929 Other chronic pain: Secondary | ICD-10-CM

## 2019-10-16 DIAGNOSIS — M6281 Muscle weakness (generalized): Secondary | ICD-10-CM | POA: Diagnosis not present

## 2019-10-16 DIAGNOSIS — R2689 Other abnormalities of gait and mobility: Secondary | ICD-10-CM

## 2019-10-16 DIAGNOSIS — M25562 Pain in left knee: Secondary | ICD-10-CM | POA: Diagnosis not present

## 2019-10-16 DIAGNOSIS — E119 Type 2 diabetes mellitus without complications: Secondary | ICD-10-CM

## 2019-10-16 DIAGNOSIS — Z794 Long term (current) use of insulin: Secondary | ICD-10-CM

## 2019-10-16 DIAGNOSIS — M25561 Pain in right knee: Secondary | ICD-10-CM | POA: Diagnosis not present

## 2019-10-16 NOTE — Therapy (Signed)
[place], [place], [address] Phone: [phone]   Fax:  [phone]  Physical Therapy Treatment  Patient Details  Name: Alicia [name] MRN: [id number] Date of Birth: 1956/03/06 Referring Provider (PT): Dorcas Mcmurray, MD   Encounter Date: 10/16/2019  PT End of Session - 10/16/19 0850    Visit Number  8    Number of Visits  17    Date for PT Re-Evaluation  10/31/19    Authorization Type  MCR- PN at visit 10    PT Start Time  0845    PT Stop Time  0925    PT Time Calculation (min)  40 min       Past Medical History:  Diagnosis Date  . Anemia   . ARTHRITIS, KNEE 09/17/2007  . Asthma 04/04/2010  . Cataract 01/07/2019  . Closed fracture of distal end of left radius 11/01/2015  . Complete deafness    Meningitis at age 89  . Deaf   . Diabetes mellitus   . Diabetic neuropathy ([place])   . Ganglion cyst 06/22/2011  . Gastroparesis   . GERD (gastroesophageal reflux disease)   . H/O: C-section   . Hyperlipidemia   . Hypertension   . Neuromuscular disorder ([place])    diabetic neuropathy  . PSVT (paroxysmal supraventricular tachycardia) ([place]) 11/09/2017   Event monitor 09/14/2017 - Predominantly sinus rhythm with episodes of narrow-complex tachycardia suggestive of paroxysmal supraventricular tachycardia.  . S/P appy     Past Surgical History:  Procedure Laterality Date  . APPENDECTOMY    . cardiolyte EF 77%, no ischemia in 2006  2006  . CARPAL TUNNEL RELEASE Right 04/20/2015   Procedure: RIGHT CARPAL TUNNEL RELEASE;  Surgeon: Daryll Brod, MD;  Location: [facility];  Service: Orthopedics;  Laterality: Right;  . CARPAL TUNNEL RELEASE Left 11/04/2015   Procedure: CARPAL TUNNEL RELEASE;  Surgeon: Daryll Brod, MD;  Location: [facility];  Service: Orthopedics;  Laterality: Left;  . CESAREAN SECTION    . OPEN REDUCTION INTERNAL FIXATION (ORIF) DISTAL RADIAL FRACTURE Left 11/04/2015   Procedure:  OPEN REDUCTION INTERNAL FIXATION (ORIF) LEFT DISTAL RADIAL FRACTURE POSSIBLE BONE GRAFT;  Surgeon: Daryll Brod, MD;  Location: [facility];  Service: Orthopedics;  Laterality: Left;  . TRIGGER FINGER RELEASE Right 04/20/2015   Procedure: RELEASE TRIGGER FINGER/A-1 PULLEY RIGHT MIDDLE FINGER,RIGHT RING FINGER;  Surgeon: Daryll Brod, MD;  Location: [facility];  Service: Orthopedics;  Laterality: Right;  . TUBAL LIGATION    . ULNAR NERVE TRANSPOSITION Right 04/20/2015   Procedure: RIGHT ULNAR NERVE DECOMPRESSION;  Surgeon: Daryll Brod, MD;  Location: [facility];  Service: Orthopedics;  Laterality: Right;  . UTERINE FIBROID EMBOLIZATION  2007  . WRIST SURGERY     Cyst removed on left  . WRIST SURGERY Right 1985   tendon repair R wrist    There were no vitals filed for this visit.  Subjective Assessment - 10/16/19 0848    Subjective  I think it is the weather , my back is hurting. The left knee popped yesterday and it was painful, better now.    Currently in Pain?  Yes    Pain Score  5     Pain Location  Back    Pain Orientation  Lower    Pain Descriptors / Indicators  Aching         [facility] PT Assessment - 10/16/19 0001      Observation/Other  Assessments   Focus on Therapeutic Outcomes (FOTO)   37% limited  (41% predicted)                    [facility] Adult PT Treatment/Exercise - 10/16/19 0001      Lumbar Exercises: Stretches   Lower Trunk Rotation Limitations  knees crossed gentle x 5, 10 sec hold       Knee/Hip Exercises: Stretches   Active Hamstring Stretch  Both;2 reps;30 seconds    Piriformis Stretch  Both;3 reps;30 seconds    Piriformis Stretch Limitations  seated     Other Knee/Hip Stretches  knee to chest 20 sec each x 2      Knee/Hip Exercises: Aerobic   Nustep  L5 UE/LE x 5 minutes       Knee/Hip Exercises: Standing   SLS  SLS needs UE touch , Tandem 5-6 sec best       Knee/Hip Exercises: Seated   Sit to Sand  10  reps   cues for descent      Knee/Hip Exercises: Supine   Bridges  2 sets;10 reps    Straight Leg Raises  15 reps    Straight Leg Raises Limitations  cues for abdominal draw in, improved endurance to reps       Knee/Hip Exercises: Sidelying   Hip ABduction  15 reps    Clams  x 15               PT Short Term Goals - 10/07/19 0950      PT SHORT TERM GOAL #1   Title  able to use her stretches during the day to maintain lower levels of pain    Baseline  utilizing but reports no change in pain    Time  3    Period  Weeks    Status  Partially Met    Target Date  09/26/19        PT Long Term Goals - 10/16/19 0925      PT LONG TERM GOAL #1   Title  Independent with long term HEP    Baseline  began establishing at eval    Time  8    Period  Weeks    Status  Partially Met      PT LONG TERM GOAL #2   Title  demo SLS at least 5s bilaterally with stable control    Baseline  needs UE assist    Time  8    Period  Weeks    Status  On-going      PT LONG TERM GOAL #3   Title  gross LE strength 5/5    Baseline  4/5    Time  8    Period  Weeks    Status  On-going      PT LONG TERM GOAL #4   Title  Pt will be able to stand from her car without increase in knee pain and without LOB    Time  8    Period  Weeks    Status  On-going      PT LONG TERM GOAL #5   Title  FOTO to 41% limited    Baseline  52% limited at eval, 37% on status    Time  8    Period  Weeks    Status  Achieved            Plan - 10/16/19 0926    Clinical Impression Statement  Pt  arrives with back pain most bothersome today. She is unable to SLS without UE support. Tandem stance 5-6 seconds at most. Needs cues to not allow LOB beyond self recovery. FOTO score improved to beyond prediected. Patient reports she feels about the same since starting PT. Will focus finalizing HEP for rest of POC.    PT Next Visit Plan  gross strength, focus streamlined HEP- sit to stand and light balance in corner?  Finalize HEp for discharge.    PT Home Exercise Plan  HSS, piriformis stretch, gastroc stretch, adductor stretch, added SLR, sit-stands, bridge with CLAM (red/green), added SLS and tandem with UE support.       Patient will benefit from skilled therapeutic intervention in order to improve the following deficits and impairments:  Increased muscle spasms, Decreased activity tolerance, Pain, Difficulty walking, Impaired flexibility, Decreased balance, Decreased strength  Visit Diagnosis: Chronic pain of left knee  Chronic pain of right knee  Muscle weakness (generalized)  Chronic bilateral low back pain without sciatica  Other abnormalities of gait and mobility     Problem List Patient Active Problem List   Diagnosis Date Noted  . Pes anserinus bursitis of both knees 08/02/2019  . Dysgeusia 07/03/2019  . Thumb laceration 06/20/2019  . Great toe pain, right 06/20/2019  . Frequent loose stools 04/20/2019  . Pruritus 03/07/2019  . Hyperthyroidism 03/07/2019  . Hypertension 01/07/2019  . Hypercalcemia 10/08/2018  . Seasonal allergies 08/27/2018  . Osteoarthritis involving multiple joints on both sides of body 05/25/2018  . Statin intolerance 12/26/2017  . PSVT (paroxysmal supraventricular tachycardia) ([place]) 11/09/2017  . Chest pain 08/30/2017  . Hyperlipidemia 08/30/2017  . OSA (obstructive sleep apnea) 08/31/2016  . Diabetes ([place]) 09/14/2015  . Muscle pain 09/28/2014  . Seizure ([place]) 06/08/2014  . Iron deficiency anemia 07/01/2012  . Diabetic retinopathy ([place]) 07/21/2011  . GERD 12/30/2007  . Diabetic polyneuropathy ([place]) 08/30/2006  . Type 2 diabetes mellitus with ophthalmic complication ([place]) 25/75/0518  . HYPERCHOLESTEROLEMIA 08/30/2006  . Hearing loss 08/30/2006    Alicia [name], PTA 10/16/2019, 11:34 AM  [facility] [address] [place], [place], [address] Phone: [phone]   Fax:   [phone]  Name: Alicia [name] MRN: [id number] Date of Birth: 03/20/1956

## 2019-10-20 ENCOUNTER — Telehealth: Payer: Self-pay | Admitting: *Deleted

## 2019-10-20 ENCOUNTER — Ambulatory Visit (INDEPENDENT_AMBULATORY_CARE_PROVIDER_SITE_OTHER): Payer: Medicare Other

## 2019-10-20 ENCOUNTER — Other Ambulatory Visit: Payer: Self-pay

## 2019-10-20 ENCOUNTER — Encounter: Payer: Self-pay | Admitting: Podiatry

## 2019-10-20 ENCOUNTER — Ambulatory Visit (INDEPENDENT_AMBULATORY_CARE_PROVIDER_SITE_OTHER): Payer: Medicare Other | Admitting: Podiatry

## 2019-10-20 VITALS — Temp 95.7°F | Resp 14

## 2019-10-20 DIAGNOSIS — B351 Tinea unguium: Secondary | ICD-10-CM | POA: Diagnosis not present

## 2019-10-20 DIAGNOSIS — M8560 Other cyst of bone, unspecified site: Secondary | ICD-10-CM

## 2019-10-20 DIAGNOSIS — M722 Plantar fascial fibromatosis: Secondary | ICD-10-CM

## 2019-10-20 DIAGNOSIS — M79675 Pain in left toe(s): Secondary | ICD-10-CM

## 2019-10-20 DIAGNOSIS — M6740 Ganglion, unspecified site: Secondary | ICD-10-CM

## 2019-10-20 DIAGNOSIS — T148XXA Other injury of unspecified body region, initial encounter: Secondary | ICD-10-CM

## 2019-10-20 NOTE — Telephone Encounter (Signed)
-----   Message from Trula Slade, DPM sent at 10/20/2019  8:58 AM EDT ----- Can you please order an MRI of the left foot. She has a bone cyst in the toe she is worried about but also having pain along the distal plantar fascia with swelling. This is to evaluate the bone cyst but also to rule out plantar fascial tear.   She has a metal plate in her arm but no other hardware.

## 2019-10-20 NOTE — Telephone Encounter (Signed)
[facility] - Dr. Lucita Ferrara states the metal plate in pt's arm will not be affected by MRI. Faxed to [facility].

## 2019-10-21 ENCOUNTER — Encounter: Payer: Self-pay | Admitting: Physical Therapy

## 2019-10-21 ENCOUNTER — Ambulatory Visit: Payer: Medicare Other | Admitting: Physical Therapy

## 2019-10-21 DIAGNOSIS — M6281 Muscle weakness (generalized): Secondary | ICD-10-CM | POA: Diagnosis not present

## 2019-10-21 DIAGNOSIS — R2689 Other abnormalities of gait and mobility: Secondary | ICD-10-CM | POA: Diagnosis not present

## 2019-10-21 DIAGNOSIS — M25562 Pain in left knee: Secondary | ICD-10-CM | POA: Diagnosis not present

## 2019-10-21 DIAGNOSIS — G8929 Other chronic pain: Secondary | ICD-10-CM

## 2019-10-21 DIAGNOSIS — M545 Low back pain: Secondary | ICD-10-CM | POA: Diagnosis not present

## 2019-10-21 DIAGNOSIS — M25561 Pain in right knee: Secondary | ICD-10-CM | POA: Diagnosis not present

## 2019-10-21 NOTE — Therapy (Signed)
[place], [place], [address] Phone: [phone]   Fax:  [phone]  Physical Therapy Treatment  Patient Details  Name: Alicia [name] MRN: [id number] Date of Birth: 01/20/1956 Referring Provider (PT): Dorcas Mcmurray, MD   Encounter Date: 10/21/2019  PT End of Session - 10/21/19 0849    Visit Number  9    Number of Visits  17    Date for PT Re-Evaluation  10/31/19    Authorization Type  MCR- PN at visit 10    PT Start Time  0845    PT Stop Time  0925    PT Time Calculation (min)  40 min       Past Medical History:  Diagnosis Date  . Anemia   . ARTHRITIS, KNEE 09/17/2007  . Asthma 04/04/2010  . Cataract 01/07/2019  . Closed fracture of distal end of left radius 11/01/2015  . Complete deafness    Meningitis at age 43  . Deaf   . Diabetes mellitus   . Diabetic neuropathy ([place])   . Ganglion cyst 06/22/2011  . Gastroparesis   . GERD (gastroesophageal reflux disease)   . H/O: C-section   . Hyperlipidemia   . Hypertension   . Neuromuscular disorder ([place])    diabetic neuropathy  . PSVT (paroxysmal supraventricular tachycardia) ([place]) 11/09/2017   Event monitor 09/14/2017 - Predominantly sinus rhythm with episodes of narrow-complex tachycardia suggestive of paroxysmal supraventricular tachycardia.  . S/P appy     Past Surgical History:  Procedure Laterality Date  . APPENDECTOMY    . cardiolyte EF 77%, no ischemia in 2006  2006  . CARPAL TUNNEL RELEASE Right 04/20/2015   Procedure: RIGHT CARPAL TUNNEL RELEASE;  Surgeon: Daryll Brod, MD;  Location: [facility];  Service: Orthopedics;  Laterality: Right;  . CARPAL TUNNEL RELEASE Left 11/04/2015   Procedure: CARPAL TUNNEL RELEASE;  Surgeon: Daryll Brod, MD;  Location: [facility];  Service: Orthopedics;  Laterality: Left;  . CESAREAN SECTION    . OPEN REDUCTION INTERNAL FIXATION (ORIF) DISTAL RADIAL FRACTURE Left 11/04/2015   Procedure:  OPEN REDUCTION INTERNAL FIXATION (ORIF) LEFT DISTAL RADIAL FRACTURE POSSIBLE BONE GRAFT;  Surgeon: Daryll Brod, MD;  Location: [facility];  Service: Orthopedics;  Laterality: Left;  . TRIGGER FINGER RELEASE Right 04/20/2015   Procedure: RELEASE TRIGGER FINGER/A-1 PULLEY RIGHT MIDDLE FINGER,RIGHT RING FINGER;  Surgeon: Daryll Brod, MD;  Location: [facility];  Service: Orthopedics;  Laterality: Right;  . TUBAL LIGATION    . ULNAR NERVE TRANSPOSITION Right 04/20/2015   Procedure: RIGHT ULNAR NERVE DECOMPRESSION;  Surgeon: Daryll Brod, MD;  Location: [facility];  Service: Orthopedics;  Laterality: Right;  . UTERINE FIBROID EMBOLIZATION  2007  . WRIST SURGERY     Cyst removed on left  . WRIST SURGERY Right 1985   tendon repair R wrist    There were no vitals filed for this visit.  Subjective Assessment - 10/21/19 0846    Subjective  I went to the [facility] yesterday. Got an injection in left foot and will have an MRI on the left foot.  I am feeling okay. I am about the same. I have not noticed that much improvement. Left hip was painful on saturday. Right knee and hip 4/10, back 5/10.    Currently in Pain?  Yes    Pain Score  5     Pain Location  Back    Pain Descriptors /  Indicators  Aching    Pain Radiating Towards  left hip    Aggravating Factors   sit-stand, walking    Pain Relieving Factors  resting         [facility] PT Assessment - 10/21/19 0001      Strength   Right Hip Flexion  4+/5    Right Hip ABduction  4+/5    Left Hip Flexion  4+/5    Left Hip ABduction  4+/5                   [facility] Adult PT Treatment/Exercise - 10/21/19 0001      Lumbar Exercises: Stretches   Lower Trunk Rotation Limitations  knees crossed gentle x 5, 10 sec hold       Knee/Hip Exercises: Stretches   Active Hamstring Stretch  Both;2 reps;30 seconds    Other Knee/Hip Stretches  lower trunk rot x 10 between sets     Other Knee/Hip Stretches   knee to chest 20 sec each x 2      Knee/Hip Exercises: Aerobic   Nustep  L5 UE/LE x 5 minutes       Knee/Hip Exercises: Standing   Functional Squat  10 reps    Functional Squat Limitations  at sink for hip mobility    SLS  SLS needs UE touch , Tandem 5-6 sec best    used corner for safety- needs cues to recover prior to LOB   Other Standing Knee Exercises  sink stretch for lower back x 3       Knee/Hip Exercises: Seated   Sit to Sand  10 reps   improved control for descent , no UE     Knee/Hip Exercises: Supine   Bridges  2 sets;10 reps    Straight Leg Raises  20 reps    Straight Leg Raises Limitations  cues for abdominal draw in, improved endurance to reps       Knee/Hip Exercises: Sidelying   Hip ABduction  20 reps    Clams  x 20               PT Short Term Goals - 10/21/19 0854      PT SHORT TERM GOAL #1   Title  able to use her stretches during the day to maintain lower levels of pain    Time  3    Period  Weeks    Status  Partially Met    Target Date  09/26/19        PT Long Term Goals - 10/21/19 0916      PT LONG TERM GOAL #1   Title  Independent with long term HEP    Baseline  performs 4-5 x per week    Time  8    Period  Weeks    Status  Achieved      PT LONG TERM GOAL #2   Title  demo SLS at least 5s bilaterally with stable control    Baseline  needs UE assist    Time  8    Period  Weeks    Status  Not Met      PT LONG TERM GOAL #3   Title  gross LE strength 5/5    Baseline  4/5 improved to 4+/5    Time  8    Period  Weeks    Status  Partially Met      PT LONG TERM GOAL #4   Title  Pt  will be able to stand from her car without increase in knee pain and without LOB    Baseline  improved control    Time  8    Period  Weeks    Status  Achieved      PT LONG TERM GOAL #5   Title  FOTO to 41% limited    Baseline  52% limited at eval, 37% on status and discharge (better than predicted)    Time  8    Period  Weeks    Status  Achieved             Plan - 10/21/19 5830    Clinical Impression Statement  Pt reports left hip has been bothering her and continued Lumbar and knee pain without improvement. Focused on reviewing and finalizing HEP for discharge next visit. She has made improvements in hip strength and can tolerate increased repetitions with strengthening. She is unstable with SLS and tandem trials and requires UE support to hold greater than 5 sec.    PT Next Visit Plan  gross strength, focus streamlined HEP- sit to stand and light balance in corner? Finalize HEp for discharge.    PT Home Exercise Plan  HSS, piriformis stretch, gastroc stretch, adductor stretch, added SLR, sit-stands, bridge with CLAM (red/green), added SLS and tandem with UE support.       Patient will benefit from skilled therapeutic intervention in order to improve the following deficits and impairments:  Increased muscle spasms, Decreased activity tolerance, Pain, Difficulty walking, Impaired flexibility, Decreased balance, Decreased strength  Visit Diagnosis: Chronic pain of left knee  Chronic pain of right knee  Muscle weakness (generalized)  Chronic bilateral low back pain without sciatica  Other abnormalities of gait and mobility     Problem List Patient Active Problem List   Diagnosis Date Noted  . Pes anserinus bursitis of both knees 08/02/2019  . Dysgeusia 07/03/2019  . Thumb laceration 06/20/2019  . Great toe pain, right 06/20/2019  . Frequent loose stools 04/20/2019  . Pruritus 03/07/2019  . Hyperthyroidism 03/07/2019  . Hypertension 01/07/2019  . Hypercalcemia 10/08/2018  . Seasonal allergies 08/27/2018  . Osteoarthritis involving multiple joints on both sides of body 05/25/2018  . Statin intolerance 12/26/2017  . PSVT (paroxysmal supraventricular tachycardia) ([place]) 11/09/2017  . Chest pain 08/30/2017  . Hyperlipidemia 08/30/2017  . OSA (obstructive sleep apnea) 08/31/2016  . Diabetes ([place]) 09/14/2015  . Muscle  pain 09/28/2014  . Seizure ([place]) 06/08/2014  . Iron deficiency anemia 07/01/2012  . Diabetic retinopathy ([place]) 07/21/2011  . GERD 12/30/2007  . Diabetic polyneuropathy ([place]) 08/30/2006  . Type 2 diabetes mellitus with ophthalmic complication ([place]) 74/60/0298  . HYPERCHOLESTEROLEMIA 08/30/2006  . Hearing loss 08/30/2006    Dorene Ar, PTA 10/21/2019, 9:21 AM  [place], [place], [address] Phone: [phone]   Fax:  [phone]  Name: Alicia [name] MRN: [id number] Date of Birth: 11-26-1955

## 2019-10-21 NOTE — Progress Notes (Signed)
Subjective: 64 year old female presents the office today for follow-up evaluation.  She states that she is having pain in the left foot.  She states that she is having pain to the toe however upon further discussion it is more the bottom of the foot where she gets discomfort.  She does at times she does get a sharp pain into the toes as well as swelling.  No recent injury.  On the right foot.  She states that her right big toenail is thick and has fungus is hard to trim.  She also notes the last couple weeks the cyst formed on the top of her right foot which is causing discomfort.  She said no recent treatment for this. Denies any systemic complaints such as fevers, chills, nausea, vomiting. No acute changes since last appointment, and no other complaints at this time.   Objective: AAO x3, NAD DP/PT pulses palpable bilaterally, CRT less than 3 seconds On the dorsal medial midfoot there is a large fluid-filled mobile soft tissue mass.  This is just distal to the ankle joint.  See procedure note below but appear to be ganglion cyst.  Right hallux toenails hypertrophic, dystrophic with yellow and brown discoloration.  No significant discomfort no redness or drainage or any signs of infection. LEFT foot: Majority of tenderness appears to be along the medial band of plantar fascia distally.  She does describe discomfort along the first MPJ as well but not able to elicit any tenderness.  No swelling identified today there is no erythema or warmth. No open lesions or pre-ulcerative lesions.  No pain with calf compression, swelling, warmth, erythema  Assessment: Left hallux bone cyst, rule out first MPJ ligamentous injury/partial tear of distal plantar fascia; right soft tissue mass, ganglion cyst; right hallux onychomycosis  Plan: -All treatment options discussed with the patient including all alternatives, risks, complications.  -Repeat x-rays of the left foot were obtained.  There are some cystic  structures further on the first MPJ within the bone.  Not sure that this is causing her symptoms.  The majority of her discomfort is more on the plantar fasciitis although not in any common area.  We will order an MRI to further right the cyst as well as the first MPJ and plantar fascia.  She is a considered conservative treatment or any resolution.  Also plantar fascial taping was applied.  Discussed wearing supportive shoes. -Right hallux nail was debrided with no complications or bleeding.  Discussed treatment for nail fungus. -Cyst on the right foot was drained.  It was with alcohol and a mixture of 3 cc of lidocaine, Marcaine plain was infiltrated in a regional block fashion.  Once anesthetized the skin was prepped with Betadine.  An 18-gauge needle was utilized to aspirate clear, gel-like fluid.  At this time the mass was deflated.  1/4cc Kenalog 10, 0.25 cc of Marcaine plain was infiltrated into the area.complications.  Compression was applied.  Post procedure instructions discussed.  She tolerated well. -Patient encouraged to call the office with any questions, concerns, change in symptoms.

## 2019-10-23 ENCOUNTER — Ambulatory Visit: Payer: Medicare Other | Admitting: Physical Therapy

## 2019-10-23 ENCOUNTER — Encounter: Payer: Self-pay | Admitting: Physical Therapy

## 2019-10-23 ENCOUNTER — Other Ambulatory Visit: Payer: Self-pay

## 2019-10-23 DIAGNOSIS — M25561 Pain in right knee: Secondary | ICD-10-CM | POA: Diagnosis not present

## 2019-10-23 DIAGNOSIS — M545 Low back pain: Secondary | ICD-10-CM | POA: Diagnosis not present

## 2019-10-23 DIAGNOSIS — G8929 Other chronic pain: Secondary | ICD-10-CM | POA: Diagnosis not present

## 2019-10-23 DIAGNOSIS — R2689 Other abnormalities of gait and mobility: Secondary | ICD-10-CM

## 2019-10-23 DIAGNOSIS — M6281 Muscle weakness (generalized): Secondary | ICD-10-CM | POA: Diagnosis not present

## 2019-10-23 DIAGNOSIS — M25562 Pain in left knee: Secondary | ICD-10-CM | POA: Diagnosis not present

## 2019-10-23 NOTE — Therapy (Addendum)
[place], [place], [address] Phone: [phone]   Fax:  [phone]  Physical Therapy Treatment/Discharge  Patient Details  Name: Alicia [name] MRN: [id number] Date of Birth: [birth year] Referring Provider (PT): Dorcas Mcmurray, MD   Encounter Date: 10/23/2019  PT End of Session - 10/23/19 0849    Visit Number  10    Number of Visits  17    Date for PT Re-Evaluation  10/31/19    Authorization Type  MCR- PN at visit 10    PT Start Time  0845    PT Stop Time  0925    PT Time Calculation (min)  40 min       Past Medical History:  Diagnosis Date  . Anemia   . ARTHRITIS, KNEE 09/17/2007  . Asthma 04/04/2010  . Cataract 01/07/2019  . Closed fracture of distal end of left radius 11/01/2015  . Complete deafness    Meningitis at age 13  . Deaf   . Diabetes mellitus   . Diabetic neuropathy ([place])   . Ganglion cyst 06/22/2011  . Gastroparesis   . GERD (gastroesophageal reflux disease)   . H/O: C-section   . Hyperlipidemia   . Hypertension   . Neuromuscular disorder ([place])    diabetic neuropathy  . PSVT (paroxysmal supraventricular tachycardia) ([place]) 11/09/2017   Event monitor 09/14/2017 - Predominantly sinus rhythm with episodes of narrow-complex tachycardia suggestive of paroxysmal supraventricular tachycardia.  . S/P appy     Past Surgical History:  Procedure Laterality Date  . APPENDECTOMY    . cardiolyte EF 77%, no ischemia in 2006  2006  . CARPAL TUNNEL RELEASE Right 04/20/2015   Procedure: RIGHT CARPAL TUNNEL RELEASE;  Surgeon: Daryll Brod, MD;  Location: [facility];  Service: Orthopedics;  Laterality: Right;  . CARPAL TUNNEL RELEASE Left 11/04/2015   Procedure: CARPAL TUNNEL RELEASE;  Surgeon: Daryll Brod, MD;  Location: [facility];  Service: Orthopedics;  Laterality: Left;  . CESAREAN SECTION    . OPEN REDUCTION INTERNAL FIXATION (ORIF) DISTAL RADIAL FRACTURE Left 11/04/2015   Procedure: OPEN REDUCTION INTERNAL FIXATION (ORIF) LEFT DISTAL RADIAL FRACTURE POSSIBLE BONE GRAFT;  Surgeon: Daryll Brod, MD;  Location: [facility];  Service: Orthopedics;  Laterality: Left;  . TRIGGER FINGER RELEASE Right 04/20/2015   Procedure: RELEASE TRIGGER FINGER/A-1 PULLEY RIGHT MIDDLE FINGER,RIGHT RING FINGER;  Surgeon: Daryll Brod, MD;  Location: [facility];  Service: Orthopedics;  Laterality: Right;  . TUBAL LIGATION    . ULNAR NERVE TRANSPOSITION Right 04/20/2015   Procedure: RIGHT ULNAR NERVE DECOMPRESSION;  Surgeon: Daryll Brod, MD;  Location: [facility];  Service: Orthopedics;  Laterality: Right;  . UTERINE FIBROID EMBOLIZATION  2007  . WRIST SURGERY     Cyst removed on left  . WRIST SURGERY Right 1985   tendon repair R wrist    There were no vitals filed for this visit.  Subjective Assessment - 10/23/19 0847    Subjective  I am doing good. Back pain is about a 5/10. It's worse in the morning. The left knee was hurting yesterday. Today 2-3/10. Up stairs is harder than down the stairs. The back hurts the most.    Patient is accompained by:  Interpreter   ALS interpreter, in person   Currently in Pain?  Yes    Pain Score  5     Pain Location  Back    Pain Orientation  Lower  Pain Descriptors / Indicators  Aching    Pain Type  Chronic pain    Aggravating Factors   sit-stand, walking    Pain Relieving Factors  resting         [facility] PT Assessment - 10/23/19 0001      Strength   Right Hip Flexion  4+/5    Right Hip ABduction  4+/5    Left Hip Flexion  4+/5    Left Hip ABduction  4+/5                   [facility] Adult PT Treatment/Exercise - 10/23/19 0001      Lumbar Exercises: Stretches   Lower Trunk Rotation Limitations  knees crossed gentle x 5, 10 sec hold       Knee/Hip Exercises: Stretches   Active Hamstring Stretch  Both;2 reps;30 seconds    Piriformis Stretch  Both;3 reps;30 seconds    Piriformis  Stretch Limitations  seated     Other Knee/Hip Stretches  lower trunk rot x 10 between sets     Other Knee/Hip Stretches  knee to chest 20 sec each x 2      Knee/Hip Exercises: Aerobic   Nustep  L5 UE/LE x 5 minutes       Knee/Hip Exercises: Standing   SLS  SLS needs UE touch , Tandem 5-6 sec best , in corner   light touch at all times to prevent LOB     Knee/Hip Exercises: Seated   Sit to Sand  10 reps   improved control for descent , no UE     Knee/Hip Exercises: Supine   Bridges  2 sets;10 reps    Straight Leg Raises  20 reps    Straight Leg Raises Limitations  cues for abdominal draw in, improved endurance to reps       Knee/Hip Exercises: Sidelying   Hip ABduction  20 reps    Clams  x 20               PT Short Term Goals - 10/23/19 0850      PT SHORT TERM GOAL #1   Title  able to use her stretches during the day to maintain lower levels of pain    Baseline  does use them intermittently    Time  3    Status  Achieved        PT Long Term Goals - 10/21/19 0916      PT LONG TERM GOAL #1   Title  Independent with long term HEP    Baseline  performs 4-5 x per week    Time  8    Period  Weeks    Status  Achieved      PT LONG TERM GOAL #2   Title  demo SLS at least 5s bilaterally with stable control    Baseline  needs UE assist    Time  8    Period  Weeks    Status  Not Met      PT LONG TERM GOAL #3   Title  gross LE strength 5/5    Baseline  4/5 improved to 4+/5    Time  8    Period  Weeks    Status  Partially Met      PT LONG TERM GOAL #4   Title  Pt will be able to stand from her car without increase in knee pain and without LOB    Baseline  improved  control    Time  8    Period  Weeks    Status  Achieved      PT LONG TERM GOAL #5   Title  FOTO to 41% limited    Baseline  52% limited at eval, 37% on status and discharge (better than predicted)    Time  8    Period  Weeks    Status  Achieved            Plan - 10/23/19 0907     Clinical Impression Statement  Pt reports she is doing okay today with Lumbar pain most bothersome at 5/10. Overall, she reports no improvement in pain. She has improved her LE strength and FOTO score. She is independent with her HEP. REviewed balance HEP as well and instructed her to maintian light touch at all times for safety. She verbalized understanding via interpreter. LTG# 1, 4, 5 met. LTG# 3 partially met. She will continue to work on HEP for continued strength and balance.    Comorbidities  diabetic neuropathy, poor balance, h/o hip and back pain    Examination-Activity Limitations  Bathing;Bed Mobility;Sit;Bend;Sleep;Squat;Stairs;Stand;Lift;Locomotion Level    PT Next Visit Plan  discharge to HEP today    PT Home Exercise Plan  HSS, piriformis stretch, gastroc stretch, adductor stretch, added SLR, sit-stands, bridge with CLAM (red/green), added SLS and tandem with UE support.       Patient will benefit from skilled therapeutic intervention in order to improve the following deficits and impairments:  Increased muscle spasms, Decreased activity tolerance, Pain, Difficulty walking, Impaired flexibility, Decreased balance, Decreased strength  Visit Diagnosis: Chronic pain of left knee  Chronic pain of right knee  Muscle weakness (generalized)  Chronic bilateral low back pain without sciatica  Other abnormalities of gait and mobility     Problem List Patient Active Problem List   Diagnosis Date Noted  . Pes anserinus bursitis of both knees 08/02/2019  . Dysgeusia 07/03/2019  . Thumb laceration 06/20/2019  . Great toe pain, right 06/20/2019  . Frequent loose stools 04/20/2019  . Pruritus 03/07/2019  . Hyperthyroidism 03/07/2019  . Hypertension 01/07/2019  . Hypercalcemia 10/08/2018  . Seasonal allergies 08/27/2018  . Osteoarthritis involving multiple joints on both sides of body 05/25/2018  . Statin intolerance 12/26/2017  . PSVT (paroxysmal supraventricular tachycardia)  ([place]) 11/09/2017  . Chest pain 08/30/2017  . Hyperlipidemia 08/30/2017  . OSA (obstructive sleep apnea) 08/31/2016  . Diabetes ([place]) 09/14/2015  . Muscle pain 09/28/2014  . Seizure ([place]) 06/08/2014  . Iron deficiency anemia 07/01/2012  . Diabetic retinopathy ([place]) 07/21/2011  . GERD 12/30/2007  . Diabetic polyneuropathy ([place]) 08/30/2006  . Type 2 diabetes mellitus with ophthalmic complication ([place]) 31/49/7026  . HYPERCHOLESTEROLEMIA 08/30/2006  . Hearing loss 08/30/2006    Dorene Ar, PTA 10/23/2019, 9:15 AM  [place], [place], [address] Phone: [phone]   Fax:  [phone]  Name: Alicia [name] MRN: [id number] Date of Birth: 1955/10/07  PHYSICAL THERAPY DISCHARGE SUMMARY  Visits from Start of Care: 10  Current functional level related to goals / functional outcomes: See above   Remaining deficits: Single leg balance, min hip and knee weakness   Education / Equipment: HEP, balance, core  Plan: Patient agrees to discharge.  Patient goals were partially met. Patient is being discharged due to being pleased with the current functional level.  ?????    Raeford Razor, PT 10/23/19 12:25 PM Phone: [phone] Fax:  [phone]  

## 2019-10-27 DIAGNOSIS — H35373 Puckering of macula, bilateral: Secondary | ICD-10-CM | POA: Diagnosis not present

## 2019-10-27 DIAGNOSIS — E103513 Type 1 diabetes mellitus with proliferative diabetic retinopathy with macular edema, bilateral: Secondary | ICD-10-CM | POA: Diagnosis not present

## 2019-10-27 DIAGNOSIS — H35031 Hypertensive retinopathy, right eye: Secondary | ICD-10-CM | POA: Diagnosis not present

## 2019-10-27 DIAGNOSIS — H35363 Drusen (degenerative) of macula, bilateral: Secondary | ICD-10-CM | POA: Diagnosis not present

## 2019-10-30 ENCOUNTER — Telehealth: Payer: Self-pay

## 2019-10-30 DIAGNOSIS — G8929 Other chronic pain: Secondary | ICD-10-CM

## 2019-10-30 NOTE — Telephone Encounter (Signed)
Order placed for imaging.   Alicia Klein, MD  PGY-1, [place]

## 2019-10-30 NOTE — Telephone Encounter (Signed)
Patient calls nurse line regarding chronic back pain. Patient reports this pain has been going on for approx. 2 years. Patient states that she has been trying OTC pain killers, however, nothing seems to be working. Patient states that her therapist in [facility] suggested her receiving a MRI.   Please advise if order can be placed or if patient needs to schedule follow up appointment.   To PCP  Talbot Grumbling, RN

## 2019-11-03 ENCOUNTER — Other Ambulatory Visit: Payer: Self-pay

## 2019-11-03 ENCOUNTER — Ambulatory Visit (INDEPENDENT_AMBULATORY_CARE_PROVIDER_SITE_OTHER): Payer: Medicare Other | Admitting: Family Medicine

## 2019-11-03 ENCOUNTER — Other Ambulatory Visit (HOSPITAL_COMMUNITY)
Admission: RE | Admit: 2019-11-03 | Discharge: 2019-11-03 | Disposition: A | Discharge status: Home / Self Care | Payer: Medicare Other | Source: Ambulatory Visit | Attending: Family Medicine | Admitting: Family Medicine

## 2019-11-03 VITALS — BP 130/60 | HR 81 | Ht 63.0 in | Wt 151.0 lb

## 2019-11-03 DIAGNOSIS — N898 Other specified noninflammatory disorders of vagina: Secondary | ICD-10-CM | POA: Diagnosis not present

## 2019-11-03 DIAGNOSIS — Z114 Encounter for screening for human immunodeficiency virus [HIV]: Secondary | ICD-10-CM

## 2019-11-03 LAB — POCT WET PREP (WET MOUNT)
Clue Cells Wet Prep Whiff POC: NEGATIVE
Trichomonas Wet Prep HPF POC: ABSENT

## 2019-11-03 NOTE — Progress Notes (Signed)
    SUBJECTIVE:   CHIEF COMPLAINT / HPI:   Vaginal discharge Alicia [name] reports ongoing vaginal discharge for roughly 1 month.  She reports that it is a white/yellow creamy discharge.  She also notes some pelvic discomfort.  She has had some occasional nausea but that was about 1 month ago and potentially related to other GI issues.  She reports that she has not been sexually active for roughly 4 years.  PERTINENT  PMH / PSH: Diabetes  OBJECTIVE:   BP 130/60   Pulse 81   Ht 5\' 3"  (1.6 m)   Wt 151 lb (68.5 kg)   SpO2 97%   BMI 26.75 kg/m    General: Comfortable appearing 64 year old woman.  No acute distress.  Communicating through a semirigid interpreter. Abdomen: Soft, nontender to palpation. Pelvic: Normal-appearing external genitalia.  Pink, moist vaginal mucosa.  Copious leukorrhea, not malodorous.  Samples taken.  No significant cervical motion tenderness or adnexal tenderness or masses.   ASSESSMENT/PLAN:   Vaginal discharge Initially, high suspicion for BV or yeast infection.  Ultimately, wet prep showed no evidence of yeast or BV or trichomoniasis.  We will plan to send out for GC/chlamydia and get testing for HIV/RPR as well for routine STI screening. -Follow-up GC/chlamydia, HIV, RPR -Follow-up in roughly 1 month for diabetes follow-up     Alicia Haymaker, MD [place]

## 2019-11-03 NOTE — Patient Instructions (Signed)
The tests that we ran in the clinic today did not show any evidence of a yeast infection or bacterial vaginosis.  We will send out additional testing to see if there is another infection that we could treat.  I will let you know about the results of this further testing.

## 2019-11-03 NOTE — Assessment & Plan Note (Signed)
Initially, high suspicion for BV or yeast infection.  Ultimately, wet prep showed no evidence of yeast or BV or trichomoniasis.  We will plan to send out for GC/chlamydia and get testing for HIV/RPR as well for routine STI screening. -Follow-up GC/chlamydia, HIV, RPR -Follow-up in roughly 1 month for diabetes follow-up

## 2019-11-04 LAB — CERVICOVAGINAL ANCILLARY ONLY
Chlamydia: NEGATIVE
Comment: NEGATIVE
Comment: NORMAL
Neisseria Gonorrhea: NEGATIVE

## 2019-11-04 LAB — RPR: RPR Ser Ql: NONREACTIVE

## 2019-11-04 LAB — HIV ANTIBODY (ROUTINE TESTING W REFLEX): HIV Screen 4th Generation wRfx: NONREACTIVE

## 2019-11-05 ENCOUNTER — Telehealth: Payer: Self-pay | Admitting: Family Medicine

## 2019-11-05 ENCOUNTER — Telehealth: Payer: Self-pay

## 2019-11-05 NOTE — Telephone Encounter (Signed)
Patient returns call to nurse line. Informed of result note, per Dr. Pilar Plate. Patient would like to talk with provider before receiving any new medications.   Talbot Grumbling, RN

## 2019-11-06 MED ORDER — METRONIDAZOLE 500 MG PO TABS
500.0000 mg | ORAL_TABLET | Freq: Two times a day (BID) | ORAL | 0 refills | Status: AC
Start: 2019-11-06 — End: 2019-11-13

## 2019-11-06 NOTE — Telephone Encounter (Signed)
Flagyl sent to pharmacy on file.

## 2019-11-06 NOTE — Telephone Encounter (Signed)
Thank you.  I have made many attempts to reach Ms. Foss without success.  Please let her know that all of her testing is negative and we did not find any concerning results.  I spoke with a gynecologist who recommended empiric treatment (which means providing general treatment even though we don't know exactly what the issue is).  If a round of antibiotics does not work, we will refer her to a gynecologist.  I have sent in 7 days of flagyl 500 mg BID to the pharmacy on file.  Thank you, Matilde Haymaker, MD

## 2019-11-06 NOTE — Telephone Encounter (Signed)
Patient calls nurse line returning [place] call. I see where all tests are negative, however we may send in Flagyl anyway, however I do not see where this has been called in. Will forward to provider. I see you you all have been playing phone tag, I am happy to relay any message to her about Flagyl.

## 2019-11-09 ENCOUNTER — Other Ambulatory Visit: Payer: Self-pay | Admitting: Student in an Organized Health Care Education/Training Program

## 2019-11-13 NOTE — Telephone Encounter (Signed)
Left VM

## 2019-11-27 ENCOUNTER — Other Ambulatory Visit: Payer: Self-pay | Admitting: Family Medicine

## 2019-11-27 DIAGNOSIS — M159 Polyosteoarthritis, unspecified: Secondary | ICD-10-CM

## 2019-12-05 ENCOUNTER — Ambulatory Visit (INDEPENDENT_AMBULATORY_CARE_PROVIDER_SITE_OTHER): Payer: Medicare Other | Admitting: Family Medicine

## 2019-12-05 ENCOUNTER — Other Ambulatory Visit: Payer: Self-pay

## 2019-12-05 ENCOUNTER — Encounter: Payer: Self-pay | Admitting: Family Medicine

## 2019-12-05 VITALS — BP 132/76 | HR 81 | Ht 63.0 in | Wt 150.4 lb

## 2019-12-05 DIAGNOSIS — E11311 Type 2 diabetes mellitus with unspecified diabetic retinopathy with macular edema: Secondary | ICD-10-CM | POA: Diagnosis not present

## 2019-12-05 DIAGNOSIS — L304 Erythema intertrigo: Secondary | ICD-10-CM | POA: Diagnosis not present

## 2019-12-05 DIAGNOSIS — N898 Other specified noninflammatory disorders of vagina: Secondary | ICD-10-CM | POA: Diagnosis not present

## 2019-12-05 DIAGNOSIS — K219 Gastro-esophageal reflux disease without esophagitis: Secondary | ICD-10-CM | POA: Diagnosis not present

## 2019-12-05 LAB — POCT GLYCOSYLATED HEMOGLOBIN (HGB A1C): HbA1c, POC (controlled diabetic range): 7.5 % — AB (ref 0.0–7.0)

## 2019-12-05 MED ORDER — KETOCONAZOLE 2 % EX CREA: 1.0000 "application " | TOPICAL_CREAM | Freq: Every day | CUTANEOUS | 0 refills | Status: DC

## 2019-12-05 MED ORDER — METRONIDAZOLE 0.75 % EX GEL: CUTANEOUS | 0 refills | Status: DC

## 2019-12-05 NOTE — Assessment & Plan Note (Signed)
>>  ASSESSMENT AND PLAN FOR DIABETES ([facility]CC) WRITTEN ON 12/05/2019 11:59 AM BY SIMMONS, MAKIERA, MD  Hemoglobin A1c checked today, 7.5  -continue medications as prescribed  -encouraged physical activity and maintaining diet  -continue CBG checks

## 2019-12-05 NOTE — Patient Instructions (Signed)
I have prescribed a cream to treat the irritation beneath your breasts called ketoconazole. Please apply daily for 2 weeks.    Intertrigo Intertrigo is skin irritation (inflammation) that happens in warm, moist areas of the body. The irritation can cause a rash and make skin raw and itchy. The rash is usually pink or red. It happens mostly between folds of skin or where skin rubs together, such as:  Between the toes.  In the armpits.  In the groin area.  Under the belly.  Under the breasts.  Around the butt area. This condition is not passed from person to person (is not contagious). What are the causes?  Heat, moisture, rubbing, and not enough air movement.  The condition can be made worse by: ? Sweat. ? Bacteria. ? A fungus, such as yeast. What increases the risk?  Moisture in your skin folds.  You are more likely to develop this condition if you: ? Have diabetes. ? Are overweight. ? Are not able to move around. ? Live in a warm and moist climate. ? Wear splints, braces, or other medical devices. ? Are not able to control your pee (urine) or poop (stool). What are the signs or symptoms?  A pink or red skin rash in the skin fold or near the skin fold.  Raw or scaly skin.  Itching.  A burning feeling.  Bleeding.  Leaking fluid.  A bad smell. How is this treated?  Cleaning and drying your skin.  Taking an antibiotic medicine or using an antibiotic skin cream for a bacterial infection.  Using an antifungal cream on your skin or taking pills for an infection that was caused by a fungus, such as yeast.  Using a steroid ointment to stop the itching and irritation.  Separating the skin fold with a clean cotton cloth to absorb moisture and allow air to flow into the area. Follow these instructions at home:  Keep the affected area clean and dry.  Do not scratch your skin.  Stay cool as much as you can. Use an air conditioner or a fan, if you have  one.  Apply over-the-counter and prescription medicines only as told by your doctor.  If you were prescribed an antibiotic medicine, use it as told by your doctor. Do not stop using the antibiotic even if your condition starts to get better.  Keep all follow-up visits as told by your doctor. This is important. How is this prevented?   Stay at a healthy weight.  Take care of your feet. This is very important if you have diabetes. You should: ? Wear shoes that fit well. ? Keep your feet dry. ? Wear clean cotton or wool socks.  Protect the skin in your groin and butt area as told by your doctor. To do this: ? Follow a regular cleaning routine. ? Use creams, powders, or ointments that protect your skin. ? Change protection pads often.  Do not wear tight clothes. Wear clothes that: ? Are loose. ? Take moisture away from your body. ? Are made of cotton.  Wear a bra that gives good support, if needed.  Shower and dry yourself well after being active. Use a hair dryer on a cool setting to dry between skin folds.  Keep your blood sugar under control if you have diabetes. Contact a doctor if:  Your symptoms do not get better with treatment.  Your symptoms get worse or they spread.  You notice more redness and warmth.  You have a  fever. Summary  Intertrigo is skin irritation that occurs when folds of skin rub together.  This condition is caused by heat, moisture, and rubbing.  This condition may be treated by cleaning and drying your skin and with medicines.  Apply over-the-counter and prescription medicines only as told by your doctor.  Keep all follow-up visits as told by your doctor. This is important. This information is not intended to replace advice given to you by your health care provider. Make sure you discuss any questions you have with your health care provider. Document Revised: 03/28/2018 Document Reviewed: 03/28/2018 Elsevier Patient Education  2020 [employer].

## 2019-12-05 NOTE — Assessment & Plan Note (Signed)
Prescribed vaginal metronidazole for 5 days.

## 2019-12-05 NOTE — Assessment & Plan Note (Addendum)
Areas of inframamillary fold erythema most consistent with intertrigo, could also consider cellulitis on differential but less likely given clinical history and physical exam appearance. Other diagnoses on differential include inverse psoriasis and tinea corporis.  -Prescribed topical ketoconazole to apply daily for 2 weeks

## 2019-12-05 NOTE — Assessment & Plan Note (Signed)
Patient to continue on pantoprazole until follow up with GI. Discussed potential side effects for long term use of this class of medications. Patient expressed understanding and would like to continue as this regimen provides symptom relief.

## 2019-12-05 NOTE — Progress Notes (Signed)
    SUBJECTIVE:   CHIEF COMPLAINT / HPI: follow for DM   Sign language interpreter was used for this encounter.   T2DM  Patient reports that she is here to have hemoglobin a1c checked.  Patient reports that her fasting glucoses seem to be between 80 and 120.  She reports that 2 days ago after eating ice cream and a snack her blood glucose was 300 and has postprandials that range in the 200s.  Before bed blood glucose is between 70 and 80.  She reports that she usually does not eat a diet that is heavy in carbohydrates.  Reports that she has wheat bread once per week.  Patient states that her ability to exercise is limited by her arthritis and back pain.  She is looking forward to having a back MRI next week.  She denies any symptoms of polydipsia but does have frequent nighttime urination.  Counseled patient on managing blood glucoses to prevent frequent fungal skin infections to which she verbalized understanding.  Patient denies any symptoms of neuropathy.  GERD  Reviewed medications for this problem.  Patient states that she was recommended by GI physician to continue pantoprazole until reevaluation next year.  Patient reports symptoms are much improved on her current medication regimen.   Irritation beneath breast Patient reports irritation with intense pruritis in her inframamillary folds.  Patient reports intermittent episodes of breakouts in this area, states that she sometimes has some itching that it breaks the skin.  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   Bacterial Vaginitis  Patient recently diagnosed with BV after wet prep. Patient reports completing antibiotic but continues to have some discomfort and discharge.   PERTINENT  PMH / PSH: DM, GERD   OBJECTIVE:   BP 132/76   Pulse 81   Ht 5\' 3"  (1.6 m)   Wt 150 lb 6.4 oz (68.2 kg)   SpO2 98%   BMI 26.64 kg/m    General: female appearing stated age in no  acute distress Cardio: Normal S1 and S2, no S3 or S4. Rhythm is regular. No murmurs or rubs.  Bilateral radial pulses palpable Chest: Evidence of moisture in inframamillary folds, mild erythema/evidence of excoriations Pulm: Clear to auscultation bilaterally, no crackles, wheezing, or diminished breath sounds. Normal respiratory effort, stable on room air Abdomen: Bowel sounds normal. Abdomen soft and non-tender.  Extremities: No peripheral edema. Warm/ well perfused.  Neuro: pt alert and oriented x4, Hearing loss   ASSESSMENT/PLAN:   Intertrigo Areas of inframamillary fold erythema most consistent with intertrigo, could also consider cellulitis on differential but less likely given clinical history and physical exam appearance. Other diagnoses on differential include inverse psoriasis and tinea corporis.  -Prescribed topical ketoconazole to apply daily for 2 weeks  Diabetes ([place]) Hemoglobin A1c checked today, 7.5  -continue medications as prescribed  -encouraged physical activity and maintaining diet  -continue CBG checks   GERD Patient to continue on pantoprazole until follow up with GI. Discussed potential side effects for long term use of this class of medications. Patient expressed understanding and would like to continue as this regimen provides symptom relief.   Vaginal discharge Prescribed vaginal metronidazole for 5 days.    F/u in 3 months or earlier as needed.   Stark Klein, MD [place]  d

## 2019-12-05 NOTE — Assessment & Plan Note (Signed)
Hemoglobin A1c checked today, 7.5  -continue medications as prescribed  -encouraged physical activity and maintaining diet  -continue CBG checks

## 2019-12-08 ENCOUNTER — Other Ambulatory Visit: Payer: Self-pay | Admitting: Family Medicine

## 2019-12-09 ENCOUNTER — Other Ambulatory Visit: Payer: Self-pay | Admitting: Family Medicine

## 2019-12-09 DIAGNOSIS — Z794 Long term (current) use of insulin: Secondary | ICD-10-CM

## 2019-12-09 DIAGNOSIS — E119 Type 2 diabetes mellitus without complications: Secondary | ICD-10-CM

## 2019-12-12 ENCOUNTER — Ambulatory Visit
Admission: RE | Admit: 2019-12-12 | Discharge: 2019-12-12 | Disposition: A | Discharge status: Home / Self Care | Payer: Medicare Other | Source: Ambulatory Visit | Attending: Family Medicine | Admitting: Family Medicine

## 2019-12-12 ENCOUNTER — Other Ambulatory Visit: Payer: Self-pay

## 2019-12-12 ENCOUNTER — Ambulatory Visit
Admission: RE | Admit: 2019-12-12 | Discharge: 2019-12-12 | Disposition: A | Discharge status: Home / Self Care | Payer: Medicare Other | Source: Ambulatory Visit | Attending: Podiatry | Admitting: Podiatry

## 2019-12-12 DIAGNOSIS — M48061 Spinal stenosis, lumbar region without neurogenic claudication: Secondary | ICD-10-CM | POA: Diagnosis not present

## 2019-12-12 DIAGNOSIS — M79675 Pain in left toe(s): Secondary | ICD-10-CM | POA: Diagnosis not present

## 2019-12-12 DIAGNOSIS — G8929 Other chronic pain: Secondary | ICD-10-CM

## 2019-12-16 ENCOUNTER — Other Ambulatory Visit: Payer: Self-pay | Admitting: Family Medicine

## 2019-12-16 ENCOUNTER — Telehealth: Payer: Self-pay | Admitting: *Deleted

## 2019-12-16 NOTE — Telephone Encounter (Signed)
-----   Message from Trula Slade, DPM sent at 12/15/2019  8:01 PM EDT ----- Val- please let her know that the MRI did not show any signfiicant issues.

## 2019-12-16 NOTE — Telephone Encounter (Signed)
Left message for pt to call for results  

## 2019-12-19 ENCOUNTER — Ambulatory Visit (INDEPENDENT_AMBULATORY_CARE_PROVIDER_SITE_OTHER): Payer: Medicare Other | Admitting: Family Medicine

## 2019-12-19 ENCOUNTER — Other Ambulatory Visit: Payer: Self-pay

## 2019-12-19 ENCOUNTER — Telehealth: Payer: Self-pay | Admitting: *Deleted

## 2019-12-19 ENCOUNTER — Encounter: Payer: Self-pay | Admitting: Family Medicine

## 2019-12-19 ENCOUNTER — Telehealth: Payer: Self-pay

## 2019-12-19 VITALS — BP 124/68 | HR 81 | Wt 151.8 lb

## 2019-12-19 DIAGNOSIS — R29818 Other symptoms and signs involving the nervous system: Secondary | ICD-10-CM

## 2019-12-19 NOTE — Patient Instructions (Signed)
Today we talked about the symptoms even having with the sensation changes in your face and some of the tingling that has been going on.  We do not think that it is necessary to call this a stroke at this point, but it certainly does seem to be like a transient neurological deficit.  We want to go ahead and get some imaging done to try and make sure that you are safe.  As we discussed in the office you are now scheduled to get an MRI of your head on Saturday 6/19 at 1:20 PM at [address] which is [employer].  We got an ultrasound scheduled for you at [facility] heart and vascular which is located at [facility], if you go to the admitting department at the front desk at 4 PM on Monday 6/21.  They can get you where you need to be and get your picture taken.  If anything happens that is a new event where you get new or worse symptoms are advises that you go straight to the emergency department.  -Dr. Criss Rosales

## 2019-12-19 NOTE — Telephone Encounter (Signed)
Patient calls nurse line reporting a weird episode last night. Patient reports she woke from her sleep feeling "tingling" all over, especially her arms, face, neck and back. Patient reports she had a metallic taste in her mouth when she tried to drink water. Patient reports it lasted for about an hour and then went away. Patient denies any facial changes, she signs, therefore is unsure of slurred speech, but feels like no. Patient has full movement of arms and legs and signing without difficulty. Patient reports she has done her daily morning routine without difficulty. Scheduled with Jacey Eckerson this AM for evaluation.

## 2019-12-19 NOTE — Telephone Encounter (Signed)
-----   Message from Trula Slade, DPM sent at 12/15/2019  8:01 PM EDT ----- Val- please let her know that the MRI did not show any signfiicant issues.

## 2019-12-19 NOTE — Progress Notes (Signed)
 *  Patient is hearing impaired and a sending with interpreter was used for the entirety of this visit in person* SUBJECTIVE:   CHIEF COMPLAINT / HPI: Neurological symptoms  Patient states that the night prior to this visit she had a an episode of a few minutes worth of tingling in her face and the feeling that her mouth did not "feel".  He said that she thought her vision got a little bit blurry but has since resolved.  There is no known trigger for this and she denies any recent sickness or trauma.  She also states that the morning of this visit at approximately 3 AM that she woke up and felt like the tingling had returned, she got up and went to her daughter's room and woke her daughter up and she said that her daughter told her her face looked funny.  She is unable to characterize further beyond this what that meant, as she is hearing impaired she did not have the ability to quantify if there was slurred speech which she felt like her mouth felt wrong and had a feeling of tingliness, she specific to say not like falling asleep, she said she did have good motor control that the sensation was off.  She says she also had slight blurriness of vision during this timeframe.  She said there was no weakness or lack of motor control in her limbs.  ABCD for TIA score 3  PERTINENT  PMH / PSH:   OBJECTIVE:   BP 124/68   Pulse 81   Wt 151 lb 12.8 oz (68.9 kg)   SpO2 99%   BMI 26.89 kg/m   General: Alert, pleasant, no distress Neuro exam: Patient expressed some sensation "difference "to right cheek and bilateral forehead.  No sensation deficit or motor control deficits in limbs bilaterally.  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 Cardiac: Regular rate and rhythm, no murmur noted to my exam Lungs: Clear to auscultation bilaterally, no increased work of breathing  ASSESSMENT/PLAN:   Transient  neurologic deficit Outpatient MRI and carotid ultrasound, significant discussion about need for patient to be around people until the work-up is complete with strict return precautions to the emergency department  At time of writing this note MRI has been negative for any explanation of the symptoms, ultrasound still pending     Alicia [name]

## 2019-12-19 NOTE — Telephone Encounter (Signed)
I informed pt through hearing impaired service of Dr. Leigh Aurora review of MRI results. Pt's service asked for results of the MRI back. I informed pt those results would come from the ordering physician Dr. Dorris Singh.

## 2019-12-20 ENCOUNTER — Ambulatory Visit
Admission: RE | Admit: 2019-12-20 | Discharge: 2019-12-20 | Disposition: A | Discharge status: Home / Self Care | Payer: Medicare Other | Source: Ambulatory Visit | Attending: Family Medicine | Admitting: Family Medicine

## 2019-12-20 DIAGNOSIS — R29818 Other symptoms and signs involving the nervous system: Secondary | ICD-10-CM

## 2019-12-20 DIAGNOSIS — G459 Transient cerebral ischemic attack, unspecified: Secondary | ICD-10-CM | POA: Diagnosis not present

## 2019-12-22 ENCOUNTER — Ambulatory Visit (HOSPITAL_COMMUNITY)
Admission: RE | Admit: 2019-12-22 | Discharge: 2019-12-22 | Disposition: A | Discharge status: Home / Self Care | Payer: Medicare Other | Source: Ambulatory Visit | Attending: Family Medicine | Admitting: Family Medicine

## 2019-12-22 ENCOUNTER — Other Ambulatory Visit: Payer: Self-pay | Admitting: Family Medicine

## 2019-12-22 ENCOUNTER — Other Ambulatory Visit: Payer: Self-pay

## 2019-12-22 DIAGNOSIS — R29818 Other symptoms and signs involving the nervous system: Secondary | ICD-10-CM | POA: Insufficient documentation

## 2019-12-22 DIAGNOSIS — E119 Type 2 diabetes mellitus without complications: Secondary | ICD-10-CM

## 2019-12-22 NOTE — Assessment & Plan Note (Signed)
Outpatient MRI and carotid ultrasound, significant discussion about need for patient to be around people until the work-up is complete with strict return precautions to the emergency department  At time of writing this note MRI has been negative for any explanation of the symptoms, ultrasound still pending

## 2019-12-22 NOTE — Progress Notes (Signed)
VASCULAR LAB    Carotid duplex completed.    Preliminary report:  See CV proc for preliminary results.  Redonna Wilbert, RVT 12/22/2019, 4:28 PM

## 2019-12-23 ENCOUNTER — Encounter: Payer: Self-pay | Admitting: Family Medicine

## 2019-12-27 ENCOUNTER — Ambulatory Visit (INDEPENDENT_AMBULATORY_CARE_PROVIDER_SITE_OTHER): Payer: Medicare Other | Admitting: Podiatry

## 2019-12-27 ENCOUNTER — Encounter: Payer: Self-pay | Admitting: Podiatry

## 2019-12-27 ENCOUNTER — Other Ambulatory Visit: Payer: Self-pay

## 2019-12-27 DIAGNOSIS — M8560 Other cyst of bone, unspecified site: Secondary | ICD-10-CM

## 2019-12-27 DIAGNOSIS — B351 Tinea unguium: Secondary | ICD-10-CM | POA: Diagnosis not present

## 2019-12-28 NOTE — Progress Notes (Signed)
Subjective: 64 year old female presents the office with interpreter for follow-up evaluation of left foot pain discussed MRI results.  She states overall she is doing well she had no pain to her foot and she does not need to think about her foot anymore.  No swelling or any new issues.  On the way out of the appointment today she was asked about her right big toenail becoming somewhat yellow discoloration asking for any medication she can put on this.  No pain, redness or drainage or any swelling. Denies any systemic complaints such as fevers, chills, nausea, vomiting. No acute changes since last appointment, and no other complaints at this time.   Objective: AAO x3, NAD DP/PT pulses palpable bilaterally, CRT less than 3 seconds Right hallux toenails mildly hypertrophic with yellow discoloration.  There is no pain of the nail there is no redness or drainage or any signs of infection.   Left foot: There is no discomfort to the foot there is no edema, erythema.  Range of motion appears to be intact.  No open lesions or pre-ulcerative lesions.  No pain with calf compression, swelling, warmth, erythema  Assessment: Resolved left foot pain; right hallux onychomycosis  Plan: -All treatment options discussed with the patient including all alternatives, risks, complications.  -I reviewed the MRI results with her.  No acute abnormality noted.  Specifically there was no focal bone lesion.  At this time her symptoms have resolved.  Discussed with general stretching, rehab exercises as well as wearing supportive shoes. -Recommended Fungi-Nail for the right hallux toenail.  No improvement next couple months we can do other treatment options. -Patient encouraged to call the office with any questions, concerns, change in symptoms.   Trula Slade DPM

## 2020-01-08 ENCOUNTER — Encounter: Payer: Self-pay | Admitting: Family Medicine

## 2020-01-08 ENCOUNTER — Ambulatory Visit (INDEPENDENT_AMBULATORY_CARE_PROVIDER_SITE_OTHER): Payer: Medicare Other | Admitting: Family Medicine

## 2020-01-08 ENCOUNTER — Other Ambulatory Visit: Payer: Self-pay

## 2020-01-08 VITALS — BP 124/72 | HR 75 | Wt 154.0 lb

## 2020-01-08 DIAGNOSIS — M48061 Spinal stenosis, lumbar region without neurogenic claudication: Secondary | ICD-10-CM | POA: Diagnosis not present

## 2020-01-08 MED ORDER — TRAMADOL HCL 50 MG PO TABS: 50.0000 mg | ORAL_TABLET | Freq: Four times a day (QID) | ORAL | 0 refills | Status: AC | PRN

## 2020-01-08 NOTE — Patient Instructions (Signed)
Thank you for choosing [facility]. Today we discussed your back pain due to lumbar spinal stenosis. I have referred you to Interventional radiology for an evaluation for steroid injection for your pain. They should contact you within the next two weeks to schedule you for an appointment.   Please follow up with Dr. Alba Cory in 4-6 weeks.    Spinal Stenosis  Spinal stenosis occurs when the open space (spinal canal) between the bones of your spine (vertebrae) narrows, putting pressure on the spinal cord or nerves. What are the causes? This condition is caused by areas of bone pushing into the central canals of your vertebrae. This condition may be present at birth (congenital), or it may be caused by:  Arthritic deterioration of your vertebrae (spinal degeneration). This usually starts around age 73.  Injury or trauma to the spine.  Tumors in the spine.  Calcium deposits in the spine. What are the signs or symptoms? Symptoms of this condition include:  Pain in the neck or back that is generally worse with activities, particularly when standing and walking.  Numbness, tingling, hot or cold sensations, weakness, or weariness in your legs.  Pain going up and down the leg (sciatica).  Frequent episodes of falling.  A foot-slapping gait that leads to muscle weakness. In more serious cases, you may develop:  Problemspassing stool or passing urine.  Difficulty having sex.  Loss of feeling in part or all of your leg. Symptoms may come on slowly and get worse over time. How is this diagnosed? This condition is diagnosed based on your medical history and a physical exam. Tests will also be done, such as:  MRI.  CT scan.  X-ray. How is this treated? Treatment for this condition often focuses on managing your pain and any other symptoms. Treatment may include:  Practicing good posture to lessen pressure on your nerves.  Exercising to strengthen muscles, build  endurance, improve balance, and maintain good joint movement (range of motion).  Losing weight, if needed.  Taking medicines to reduce swelling, inflammation, or pain.  Assistive devices, such as a corset or brace. In some cases, surgery may be needed. The most common procedure is decompression laminectomy. This is done to remove excess bone that puts pressure on your nerve roots. Follow these instructions at home: Managing pain, stiffness, and swelling  Do all exercises and stretches as told by your health care provider.  Practice good posture. If you were given a brace or a corset, wear it as told by your health care provider.  Do not do any activities that cause pain. Ask your health care provider what activities are safe for you.  Do not lift anything that is heavier than 10 lb (4.5 kg) or the limit that your health care provider tells you.  Maintain a healthy weight. Talk with your health care provider if you need help losing weight.  If directed, apply heat to the affected area as often as told by your health care provider. Use the heat source that your health care provider recommends, such as a moist heat pack or a heating pad. ? Place a towel between your skin and the heat source. ? Leave the heat on for 20-30 minutes. ? Remove the heat if your skin turns bright red. This is especially important if you are not able to feel pain, heat, or cold. You may have a greater risk of getting burned. General instructions  Take over-the-counter and prescription medicines only as told by your  health care provider.  Do not use any products that contain nicotine or tobacco, such as cigarettes and e-cigarettes. If you need help quitting, ask your health care provider.  Eat a healthy diet. This includes plenty of fruits and vegetables, whole grains, and low-fat (lean) protein.  Keep all follow-up visits as told by your health care provider. This is important. Contact a health care provider  if:  Your symptoms do not get better or they get worse.  You have a fever. Get help right away if:  You have new or worse pain in your neck or upper back.  You have severe pain that cannot be controlled with medicines.  You are dizzy.  You have vision problems, blurred vision, or double vision.  You have a severe headache that is worse when you stand.  You have nausea or you vomit.  You develop new or worse numbness or tingling in your back or legs.  You have pain, redness, swelling, or warmth in your arm or leg. Summary  Spinal stenosis occurs when the open space (spinal canal) between the bones of your spine (vertebrae) narrows. This narrowing puts pressure on the spinal cord or nerves.  Spinal stenosis can cause numbness, weakness, or pain in the neck, back, and legs.  This condition may be caused by a birth defect, arthritic deterioration of your vertebrae, injury, tumors, or calcium deposits.  This condition is usually diagnosed with MRIs, CT scans, and X-rays. This information is not intended to replace advice given to you by your health care provider. Make sure you discuss any questions you have with your health care provider. Document Revised: 06/01/2017 Document Reviewed: 05/24/2016 Elsevier Patient Education  2020 [employer].

## 2020-01-08 NOTE — Progress Notes (Signed)
    SUBJECTIVE:   CHIEF COMPLAINT / HPI:   Spinal Stenosis  Patient reports continued back pain with any type of bending over to pick up items despite taking medications for pain. She reports that her pain was no problem while she was on vacation from work. When asked if there is possibility for light duty work, patient states there is not option for lighter duty, she would just be sent home from work which would not be good. 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 She states that she  has tried wearing a back brace before that was too uncomfortable and would move throughout the day without providing much support. She reports that she has tried several medications including NSAIDS, tylenol, meloxicam, icy hot topical and heating pads with no relief. Sometimes the pain is better in the AM. Standing really causes her to have a lot of pain. Sitting for long periods of time hurts as well. Patient reports she previously had PT and they recommended a MRI and the results showed L2-L5 stenosis.    PERTINENT  PMH / PSH: chronic back pain due to lumbar stenosis   OBJECTIVE:   BP 124/72   Pulse 75   Wt 154 lb (69.9 kg)   SpO2 98%   BMI 27.28 kg/m    General: female appearing stated age in no acute distress Cardio: Normal S1 and S2, no S3 or S4. Rhythm is regular. No murmurs or rubs.  Bilateral radial pulses palpable Pulm: Clear to auscultation bilaterally, no crackles, wheezing, or diminished breath sounds.  Abdomen: Bowel sounds normal. Abdomen soft and non-tender.  Neuro: pt alert and oriented x4, sensation intact in lower extremities Back: patient's back extension and flexion limited due to pain, no skin changes noted along area of spine, +straight leg testing    ASSESSMENT/PLAN:   Spinal stenosis at L4-L5 level MRI confirmed spinal stenosis with pain during flexion and extension of spine and sitting. Patient previously had steroid injections that she  reports did provide some pain relief and she is agreeable to receiving injections again.  - precepted with Dr. McDiarmid - prescribed Tramadol 50mg   - referral to IR for evaluation for epidural steroid injection  - patient to follow up in clinic after evaluation by IR      Eulis Foster, MD [place]

## 2020-01-12 DIAGNOSIS — M48061 Spinal stenosis, lumbar region without neurogenic claudication: Secondary | ICD-10-CM | POA: Insufficient documentation

## 2020-01-12 NOTE — Assessment & Plan Note (Signed)
MRI confirmed spinal stenosis with pain during flexion and extension of spine and sitting. Patient previously had steroid injections that she reports did provide some pain relief and she is agreeable to receiving injections again.  - precepted with Dr. McDiarmid - prescribed Tramadol 50mg   - referral to IR for evaluation for epidural steroid injection  - patient to follow up in clinic after evaluation by IR

## 2020-01-21 ENCOUNTER — Ambulatory Visit: Payer: Medicare Other

## 2020-01-21 DIAGNOSIS — H35363 Drusen (degenerative) of macula, bilateral: Secondary | ICD-10-CM | POA: Diagnosis not present

## 2020-01-21 DIAGNOSIS — H43813 Vitreous degeneration, bilateral: Secondary | ICD-10-CM | POA: Diagnosis not present

## 2020-01-21 DIAGNOSIS — E103593 Type 1 diabetes mellitus with proliferative diabetic retinopathy without macular edema, bilateral: Secondary | ICD-10-CM | POA: Diagnosis not present

## 2020-01-27 NOTE — Progress Notes (Signed)
    SUBJECTIVE:   CHIEF COMPLAINT / HPI:   Vaginal Discharge Multiple previous visits for vaginal discharge (11/03/19 w/ Dr. Pilar Plate) Discharge is yellow, blotchy looking, sometimes a little blood. Sometimes seems like a lot but other times less. Vaginal itching in the front. Also saw small little bumps in the area and then they disappeared after two weeks about 3 weeks ago and then was seen in clinic afterwards. Bumps were not painful. History of fibroid surgery.  Noticed some firmness in her right pelvic area and feels like it is getting a little bigger.   PERTINENT  PMH / PSH: No history of abnormal Pap smears  OBJECTIVE:   BP (!) 112/60   Pulse 75   Ht 5\' 3"  (1.6 m)   Wt 151 lb 6.4 oz (68.7 kg)   SpO2 96%   BMI 26.82 kg/m   General: Well-appearing, no acute distress Abdomen: Positive bowel sounds.  No tenderness to palpation.  Nondistended.  Hernia appreciated at the superiormost aspect of surgical scar right of umbilicus.  Reducible. Vagina: Normal external vulva.  Postmenopausal changes with mucosal thinning and absent rugae.  Cervical os with surround erythema with irregular borders. Friable when obtaining sample. No abnormalities on bimanual exam.  ASSESSMENT/PLAN:   Vaginal discharge Has been seen multiple times for this. Friable cervix on exam. Scant bright red blood on underwear at home mixed with yellow-ish discharge. STDs previously ruled out and otherwise not sexually active. Also due to pap smear. If pap smear negative, patient may benefit from endometrial biopsy for scant postmenopausal blood.  Wet prep results similar to previous with white blood cells and no other findings.  It is possible that discharge is a result of postmenopausal vaginal changes.  Can consider trying estrogen cream in the future, however, would wait for Pap smear and endometrial biopsy.    Patient prefers communication via Pharmacist, community.  Hernia of abdominal wall Referral to general surgery as hernia is  bothersome to patient.  Freely reducible with no red flags today.   Wilber Oliphant, MD [place]

## 2020-01-28 ENCOUNTER — Ambulatory Visit (INDEPENDENT_AMBULATORY_CARE_PROVIDER_SITE_OTHER): Payer: Medicare Other | Admitting: Family Medicine

## 2020-01-28 ENCOUNTER — Other Ambulatory Visit: Payer: Self-pay

## 2020-01-28 ENCOUNTER — Other Ambulatory Visit (HOSPITAL_COMMUNITY)
Admission: RE | Admit: 2020-01-28 | Discharge: 2020-01-28 | Disposition: A | Discharge status: Home / Self Care | Payer: Medicare Other | Source: Ambulatory Visit | Attending: Family Medicine | Admitting: Family Medicine

## 2020-01-28 ENCOUNTER — Encounter: Payer: Self-pay | Admitting: Family Medicine

## 2020-01-28 VITALS — BP 112/60 | HR 75 | Ht 63.0 in | Wt 151.4 lb

## 2020-01-28 DIAGNOSIS — Z1151 Encounter for screening for human papillomavirus (HPV): Secondary | ICD-10-CM | POA: Insufficient documentation

## 2020-01-28 DIAGNOSIS — K439 Ventral hernia without obstruction or gangrene: Secondary | ICD-10-CM | POA: Insufficient documentation

## 2020-01-28 DIAGNOSIS — Z124 Encounter for screening for malignant neoplasm of cervix: Secondary | ICD-10-CM

## 2020-01-28 DIAGNOSIS — N898 Other specified noninflammatory disorders of vagina: Secondary | ICD-10-CM | POA: Diagnosis not present

## 2020-01-28 LAB — POCT WET PREP (WET MOUNT)
Clue Cells Wet Prep Whiff POC: NEGATIVE
Trichomonas Wet Prep HPF POC: ABSENT
WBC, Wet Prep HPF POC: >20

## 2020-01-28 NOTE — Assessment & Plan Note (Addendum)
Has been seen multiple times for this. Friable cervix on exam. Scant bright red blood on underwear at home mixed with yellow-ish discharge. STDs previously ruled out and otherwise not sexually active. Also due to pap smear. If pap smear negative, patient may benefit from endometrial biopsy for scant postmenopausal blood.  Wet prep results similar to previous with white blood cells and no other findings.  It is possible that discharge is a result of postmenopausal vaginal changes.  Can consider trying estrogen cream in the future, however, would wait for Pap smear and endometrial biopsy.    Patient prefers communication via Pharmacist, community.

## 2020-01-28 NOTE — Assessment & Plan Note (Signed)
Referral to general surgery as hernia is bothersome to patient.  Freely reducible with no red flags today.

## 2020-01-28 NOTE — Patient Instructions (Addendum)
I am sending samples out for Pap smear.  I am unsure of what the yellow discharge is at this time.  All of your testing has been negative in the past and has only showed white blood cells with no pathogen (anything that could be causing an infection). You continue to have white blood cells today without any other finding. You can try over the counter antibiotics such as:   To help with vaginal flora. Usually, discharge is due to an overgrowth of a specific bacteria.   You also have a hernia. I have sent a referral to general surgery.  If you feel like you cannot replace a bump inside of your stomach, this would be concerning for your intestines possibly being stuck and could cause some abdominal pain and bloody bowel movements.  It does not look like this is currently happening but can be a sequela. Go straight to the emergency room if this is happening.

## 2020-01-29 LAB — CYTOLOGY - PAP
Comment: NEGATIVE
Diagnosis: NEGATIVE
High risk HPV: NEGATIVE

## 2020-02-08 NOTE — Progress Notes (Signed)
    SUBJECTIVE:   CHIEF COMPLAINT / HPI: follow up for back pain   Patient states that she continues to have back pain but improved.  She reports that she never heard from the referral that was sent at our last visit in July.  Patient states that she has not tried any new medications since visit in July.  She states that the tramadol did help for a few days.  Patient would like to pursue spine injections for pain.  She denies any lower extremity numbness, denies urinary or bladder incontinence.  Patient states that she is able to walk but does have pain with sweeping, vacuuming and since pain has become more persistent, she has her daughter to lift things for her.   PERTINENT  PMH / PSH: L3-L4 severe spinal canal stenosis with moderate right and mild left neural foraminal stenosis. L2-L3 moderate spinal canal stenosis and mild left neural foraminal stenosis.  L4-L5 mild spinal canal stenosis.  OBJECTIVE:   BP 132/64   Pulse 77   Ht 5\' 3"  (1.6 m)   Wt 149 lb 6.4 oz (67.8 kg)   SpO2 97%   BMI 26.47 kg/m    General: female appearing stated age in no acute distress Cardio: Normal S1 and S2, no S3 or S4. Rhythm is regular. No murmurs or rubs.  Bilateral radial pulses palpable Abdomen: Bowel sounds normal. Abdomen soft and non-tender.  Neuro: no decreased sensation to lower extremities Back: unable to demonstrate extension 2/2 to pain, limited rotation to bilateral sides, able to flex back slowly, patient slowly rises to standing position, no external spinal curvature noted   ASSESSMENT/PLAN:   Spinal stenosis at L4-L5 level Referral to neurosurgery for evaluation for steroid injections Tramadol prescribed in interim for severe pain days as patient reports some relief with prior prescription   Follow up after evaluation with neurosurgery   Eulis Foster, MD [place]

## 2020-02-09 ENCOUNTER — Ambulatory Visit (INDEPENDENT_AMBULATORY_CARE_PROVIDER_SITE_OTHER): Payer: Medicare Other | Admitting: Family Medicine

## 2020-02-09 ENCOUNTER — Encounter: Payer: Self-pay | Admitting: Family Medicine

## 2020-02-09 ENCOUNTER — Other Ambulatory Visit: Payer: Self-pay

## 2020-02-09 DIAGNOSIS — M48061 Spinal stenosis, lumbar region without neurogenic claudication: Secondary | ICD-10-CM

## 2020-02-09 MED ORDER — TRAMADOL HCL 50 MG PO TABS: 50.0000 mg | ORAL_TABLET | Freq: Four times a day (QID) | ORAL | 0 refills | Status: AC | PRN

## 2020-02-09 NOTE — Assessment & Plan Note (Addendum)
Referral to neurosurgery for evaluation for steroid injections Tramadol prescribed in interim for severe pain days as patient reports some relief with prior prescription

## 2020-02-09 NOTE — Patient Instructions (Signed)
It was a pleasure to see you today!  Thank you for choosing [facility] for your primary care.  Alicia [name] was seen for follow-up for back pain.   Our plans for today were:  I have resent a referral to have neurosurgery evaluate you and consider you for steroid injections to help with your pain.  Notify office if you do not hear about an appointment for this referral within the next 2 weeks.   You should return to our clinic as needed.  Best Wishes,   Dr. Alba Cory     Chronic Back Pain When back pain lasts longer than 3 months, it is called chronic back pain. Pain may get worse at certain times (flare-ups). There are things you can do at home to manage your pain. Follow these instructions at home: Activity      Avoid bending and other activities that make pain worse.  When standing: ? Keep your upper back and neck straight. ? Keep your shoulders pulled back. ? Avoid slouching.  When sitting: ? Keep your back straight. ? Relax your shoulders. Do not round your shoulders or pull them backward.  Do not sit or stand in one place for long periods of time.  Take short rest breaks during the day. Lying down or standing is usually better than sitting. Resting can help relieve pain.  When sitting or lying down for a long time, do some mild activity or stretching. This will help to prevent stiffness and pain.  Get regular exercise. Ask your doctor what activities are safe for you.  Do not lift anything that is heavier than 10 lb (4.5 kg). To prevent injury when you lift things: ? Bend your knees. ? Keep the weight close to your body. ? Avoid twisting. Managing pain  If told, put ice on the painful area. Your doctor may tell you to use ice for 24-48 hours after a flare-up starts. ? Put ice in a plastic bag. ? Place a towel between your skin and the bag. ? Leave the ice on for 20 minutes, 2-3 times a day.  If told, put heat on the painful area as  often as told by your doctor. Use the heat source that your doctor recommends, such as a moist heat pack or a heating pad. ? Place a towel between your skin and the heat source. ? Leave the heat on for 20-30 minutes. ? Remove the heat if your skin turns bright red. This is especially important if you are unable to feel pain, heat, or cold. You may have a greater risk of getting burned.  Soak in a warm bath. This can help relieve pain.  Take over-the-counter and prescription medicines only as told by your doctor. General instructions  Sleep on a firm mattress. Try lying on your side with your knees slightly bent. If you lie on your back, put a pillow under your knees.  Keep all follow-up visits as told by your doctor. This is important. Contact a doctor if:  You have pain that does not get better with rest or medicine. Get help right away if:  One or both of your arms or legs feel weak.  One or both of your arms or legs lose feeling (numbness).  You have trouble controlling when you poop (bowel movement) or pee (urinate).  You feel sick to your stomach (nauseous).  You throw up (vomit).  You have belly (abdominal) pain.  You have shortness of breath.  You pass  out (faint). Summary  When back pain lasts longer than 3 months, it is called chronic back pain.  Pain may get worse at certain times (flare-ups).  Use ice and heat as told by your doctor. Your doctor may tell you to use ice after flare-ups. This information is not intended to replace advice given to you by your health care provider. Make sure you discuss any questions you have with your health care provider. Document Revised: 10/10/2018 Document Reviewed: 02/01/2017 Elsevier Patient Education  2020 [employer].

## 2020-02-11 ENCOUNTER — Other Ambulatory Visit: Payer: Self-pay | Admitting: Family Medicine

## 2020-02-11 DIAGNOSIS — M159 Polyosteoarthritis, unspecified: Secondary | ICD-10-CM

## 2020-02-15 ENCOUNTER — Other Ambulatory Visit: Payer: Self-pay | Admitting: Family Medicine

## 2020-02-15 DIAGNOSIS — Z794 Long term (current) use of insulin: Secondary | ICD-10-CM

## 2020-02-15 DIAGNOSIS — E119 Type 2 diabetes mellitus without complications: Secondary | ICD-10-CM

## 2020-02-17 ENCOUNTER — Other Ambulatory Visit: Payer: Self-pay | Admitting: Family Medicine

## 2020-02-17 ENCOUNTER — Telehealth: Payer: Self-pay

## 2020-02-17 DIAGNOSIS — E119 Type 2 diabetes mellitus without complications: Secondary | ICD-10-CM

## 2020-02-17 MED ORDER — BASAGLAR KWIKPEN 100 UNIT/ML ~~LOC~~ SOPN: 10.0000 [IU] | PEN_INJECTOR | Freq: Every day | SUBCUTANEOUS | 4 refills | Status: DC

## 2020-02-17 NOTE — Telephone Encounter (Signed)
Contacted both patient and pharmacy. Refill sent for glargine.

## 2020-02-17 NOTE — Progress Notes (Signed)
Patient reports she has been without glardin insulin for 4 days and her blood glucose has been increasing to greater than 350. Contacted pharmacist for assistance. Wrote for patient to use up to 10 units of glargine daily from 8units previously.

## 2020-02-17 NOTE — Telephone Encounter (Signed)
Received phone call from patient regarding issues with cost of insulin. Per pharmacist, insurance will not cover additional pens until 8/29. Patient reports that she has needed to use additional insulin due to elevated blood sugars. Patient reports CBG of 375 this AM. Attempted to return phone call to patient to gather more information, no answer, left VM for patient to return call to office. Per pharmacist if directions were changed on signature, insurance could cover.   To PCP  Please advise  Talbot Grumbling, RN

## 2020-02-29 ENCOUNTER — Other Ambulatory Visit: Payer: Self-pay | Admitting: Family Medicine

## 2020-03-04 ENCOUNTER — Other Ambulatory Visit: Payer: Self-pay | Admitting: Family Medicine

## 2020-03-04 DIAGNOSIS — R109 Unspecified abdominal pain: Secondary | ICD-10-CM | POA: Diagnosis not present

## 2020-03-07 ENCOUNTER — Other Ambulatory Visit: Payer: Self-pay | Admitting: Family Medicine

## 2020-03-07 DIAGNOSIS — Z794 Long term (current) use of insulin: Secondary | ICD-10-CM

## 2020-03-07 DIAGNOSIS — E119 Type 2 diabetes mellitus without complications: Secondary | ICD-10-CM

## 2020-03-17 ENCOUNTER — Telehealth: Payer: Self-pay

## 2020-03-17 NOTE — Telephone Encounter (Signed)
Patient calls nurse line to schedule appointment for diabetes follow up. Scheduled patient for first available on October 1st with PCP. Patient is also reporting skin irritation, that she has been treating with Ketoconazole cream. Patient states that she is having itching under breast and in skin folds. Positive redness and irritation to areas. Patient would like to know if there is anything additionally she could try between now and scheduled appointment.   To PCP   Please advise  *Patient requests that if she does not answer the phone that provider leave detailed voice message.   Talbot Grumbling, RN

## 2020-03-18 DIAGNOSIS — H35371 Puckering of macula, right eye: Secondary | ICD-10-CM | POA: Diagnosis not present

## 2020-03-18 DIAGNOSIS — H35363 Drusen (degenerative) of macula, bilateral: Secondary | ICD-10-CM | POA: Diagnosis not present

## 2020-03-18 DIAGNOSIS — H4312 Vitreous hemorrhage, left eye: Secondary | ICD-10-CM | POA: Diagnosis not present

## 2020-03-18 DIAGNOSIS — E113593 Type 2 diabetes mellitus with proliferative diabetic retinopathy without macular edema, bilateral: Secondary | ICD-10-CM | POA: Diagnosis not present

## 2020-03-23 ENCOUNTER — Other Ambulatory Visit: Payer: Self-pay | Admitting: Family Medicine

## 2020-03-23 ENCOUNTER — Encounter: Payer: Self-pay | Admitting: Family Medicine

## 2020-03-23 MED ORDER — MUPIROCIN 2 % EX OINT: 1.0000 "application " | TOPICAL_OINTMENT | Freq: Two times a day (BID) | CUTANEOUS | 0 refills | Status: DC

## 2020-03-23 NOTE — Telephone Encounter (Signed)
Voicemail left for patient. Prescribed ointment to be applied to affected areas for 7 days. Patient to follow up as scheduled.

## 2020-03-23 NOTE — Progress Notes (Signed)
Prescribed course of mupirocin for skin fold pruritis and irritation. Patient to apply twice daily for 7 days and stop if no improvement. We will reassess at 10/1 appointment.

## 2020-03-24 ENCOUNTER — Other Ambulatory Visit: Payer: Self-pay | Admitting: Family Medicine

## 2020-03-24 DIAGNOSIS — Z1231 Encounter for screening mammogram for malignant neoplasm of breast: Secondary | ICD-10-CM

## 2020-03-26 ENCOUNTER — Other Ambulatory Visit: Payer: Self-pay | Admitting: Family Medicine

## 2020-03-26 ENCOUNTER — Telehealth: Payer: Self-pay

## 2020-03-26 DIAGNOSIS — Z1231 Encounter for screening mammogram for malignant neoplasm of breast: Secondary | ICD-10-CM

## 2020-03-26 NOTE — Telephone Encounter (Signed)
Order for 3D mammogram placed as requested for 1 year follow up

## 2020-03-26 NOTE — Progress Notes (Signed)
3D mammogram order placed for patient to schedule.

## 2020-03-26 NOTE — Telephone Encounter (Signed)
Patient LVM on nurse line regarding receiving order for mammogram. It seems that patient's last mammogram (04/23/19) was indicated that patient have follow up screening mammogram in one year.   Patient does have follow up appointment with PCP on 04/02/20.   Please advise if order can be placed so patient can be scheduled for re screening.   Talbot Grumbling, RN

## 2020-04-02 ENCOUNTER — Encounter: Payer: Self-pay | Admitting: Family Medicine

## 2020-04-02 ENCOUNTER — Ambulatory Visit (INDEPENDENT_AMBULATORY_CARE_PROVIDER_SITE_OTHER): Payer: Medicare Other | Admitting: Family Medicine

## 2020-04-02 ENCOUNTER — Other Ambulatory Visit: Payer: Self-pay

## 2020-04-02 VITALS — BP 128/80 | HR 81 | Ht 64.0 in | Wt 149.0 lb

## 2020-04-02 DIAGNOSIS — E11311 Type 2 diabetes mellitus with unspecified diabetic retinopathy with macular edema: Secondary | ICD-10-CM | POA: Diagnosis not present

## 2020-04-02 DIAGNOSIS — Z23 Encounter for immunization: Secondary | ICD-10-CM

## 2020-04-02 DIAGNOSIS — Z0000 Encounter for general adult medical examination without abnormal findings: Secondary | ICD-10-CM

## 2020-04-02 DIAGNOSIS — L304 Erythema intertrigo: Secondary | ICD-10-CM | POA: Diagnosis not present

## 2020-04-02 LAB — POCT GLYCOSYLATED HEMOGLOBIN (HGB A1C): HbA1c, POC (controlled diabetic range): 6.9 % (ref 0.0–7.0)

## 2020-04-02 NOTE — Patient Instructions (Addendum)
It was a pleasure to see you today!  Thank you for choosing [facility] for your primary care.  Alicia [name] was seen for diabetes follow up and flu vaccine.   Our plans for today were:  We checked your a1c. Please continue to monitor your blood sugars and take your diabetes medications as prescribed  Please send a message via mychart when you have a chance to talk with your daughter about the medication that helped for you skin.    You should return to our clinic in 3 months for diabetes follow up.   Best Wishes,   Dr. Alba Cory

## 2020-04-02 NOTE — Progress Notes (Signed)
    SUBJECTIVE:   CHIEF COMPLAINT / HPI: DM f/u and rash with pruritis  Interpreter was present for the duration of this appointment.   Intertrigo   Had cream from her daughter and has one stubborn spot on left hip that has a lot of itching. Patient reports that the appearance of the rash in her inframamillary folds has improved and is no longer pruritic. Has tried triamcinolone cream and had steroid cream that did not make much of a difference. Daughter gave her a cream that helped but she is unsure of the name of the medication.   DM  Blood sugars at home ranged from 300s after meals and lower. Down to 8 sometimes. In the AM she gets measurements of 109, yesterday was in the 90s. No symptoms of hypoglycemia. Describes an episode where her daughter said she was disoriented and had mood changes, was not sure what her blood sugar was at the time. She had not eaten prior to that episode and attributes change in behavior to this.   PERTINENT  PMH / PSH:  DM Intertrigo    OBJECTIVE:   BP 128/80   Pulse 81   Ht 5\' 4"  (1.626 m)   Wt 149 lb (67.6 kg)   SpO2 95%   BMI 25.58 kg/m    General: female appearing stated age in no acute distress HEENT: MMM, no oral lesions noted,Neck non-tender without lymphadenopathy Cardio: Normal S1 and S2, no S3 or S4. Rhythm is regular. No murmurs or rubs.  Bilateral radial pulses palpable Pulm: Clear to auscultation bilaterally, no crackles, wheezing, or diminished breath sounds. Normal respiratory effort Abdomen: Bowel sounds normal. Abdomen soft and non-tender. Extremities: No peripheral edema.  Neuro: pt alert and oriented x4   ASSESSMENT/PLAN:   Diabetes ([place]) Hemoglobin A1c collected today Patient to continue with 10 units insulin daily and meal time sliding scale of 1-4 units  Goal A1c <7  Repeat A1c in 3 months  Foot exam completed in office today   Intertrigo Patient will continue to use topical given to her by her daughter Patient  recommended to send MyChart message once she is able to determine the name of the medication in case she needs more in the future Patient on gabapentin for neuropathy   Healthcare maintenance Flu shot completed in office today    F/u in 3 months   Eulis Foster, MD [place]

## 2020-04-03 ENCOUNTER — Other Ambulatory Visit: Payer: Self-pay | Admitting: Family Medicine

## 2020-04-03 DIAGNOSIS — M159 Polyosteoarthritis, unspecified: Secondary | ICD-10-CM

## 2020-04-04 NOTE — Assessment & Plan Note (Signed)
Hemoglobin A1c collected today Patient to continue with 10 units insulin daily and meal time sliding scale of 1-4 units  Goal A1c <7  Repeat A1c in 3 months  Foot exam completed in office today

## 2020-04-04 NOTE — Assessment & Plan Note (Signed)
Patient will continue to use topical given to her by her daughter Patient recommended to send MyChart message once she is able to determine the name of the medication in case she needs more in the future Patient on gabapentin for neuropathy

## 2020-04-04 NOTE — Assessment & Plan Note (Signed)
>>  ASSESSMENT AND PLAN FOR DIABETES ([facility]) WRITTEN ON 04/04/2020  4:15 AM BY SIMMONS, MAKIERA, MD  Hemoglobin A1c collected today Patient to continue with 10 units insulin  daily and meal time sliding scale of 1-4 units  Goal A1c <7  Repeat A1c in 3 months  Foot exam completed in office today

## 2020-04-04 NOTE — Assessment & Plan Note (Signed)
Flu shot completed in office today

## 2020-04-05 ENCOUNTER — Encounter: Payer: Self-pay | Admitting: Family Medicine

## 2020-04-05 DIAGNOSIS — H35373 Puckering of macula, bilateral: Secondary | ICD-10-CM | POA: Diagnosis not present

## 2020-04-05 DIAGNOSIS — H35363 Drusen (degenerative) of macula, bilateral: Secondary | ICD-10-CM | POA: Diagnosis not present

## 2020-04-05 DIAGNOSIS — E113593 Type 2 diabetes mellitus with proliferative diabetic retinopathy without macular edema, bilateral: Secondary | ICD-10-CM | POA: Diagnosis not present

## 2020-04-05 DIAGNOSIS — H4311 Vitreous hemorrhage, right eye: Secondary | ICD-10-CM | POA: Diagnosis not present

## 2020-04-08 DIAGNOSIS — E113592 Type 2 diabetes mellitus with proliferative diabetic retinopathy without macular edema, left eye: Secondary | ICD-10-CM | POA: Diagnosis not present

## 2020-04-25 DIAGNOSIS — Z20822 Contact with and (suspected) exposure to covid-19: Secondary | ICD-10-CM | POA: Diagnosis not present

## 2020-04-28 DIAGNOSIS — M48062 Spinal stenosis, lumbar region with neurogenic claudication: Secondary | ICD-10-CM | POA: Diagnosis not present

## 2020-04-28 DIAGNOSIS — I10 Essential (primary) hypertension: Secondary | ICD-10-CM | POA: Diagnosis not present

## 2020-05-06 ENCOUNTER — Ambulatory Visit
Admission: RE | Admit: 2020-05-06 | Discharge: 2020-05-06 | Disposition: A | Discharge status: Home / Self Care | Payer: Medicare Other | Source: Ambulatory Visit | Attending: Family Medicine | Admitting: Family Medicine

## 2020-05-06 ENCOUNTER — Other Ambulatory Visit: Payer: Self-pay

## 2020-05-06 DIAGNOSIS — Z1231 Encounter for screening mammogram for malignant neoplasm of breast: Secondary | ICD-10-CM | POA: Diagnosis not present

## 2020-05-07 ENCOUNTER — Other Ambulatory Visit: Payer: Self-pay | Admitting: Student

## 2020-05-07 ENCOUNTER — Other Ambulatory Visit: Payer: Self-pay | Admitting: Internal Medicine

## 2020-05-07 ENCOUNTER — Other Ambulatory Visit: Payer: Self-pay | Admitting: Family Medicine

## 2020-05-07 DIAGNOSIS — Z23 Encounter for immunization: Secondary | ICD-10-CM | POA: Diagnosis not present

## 2020-05-07 NOTE — Telephone Encounter (Signed)
Pt was last seen by lipid clinic. Please address

## 2020-05-07 NOTE — Telephone Encounter (Signed)
Refill Request.  

## 2020-05-07 NOTE — Telephone Encounter (Signed)
Called patient to schedule appointment. There was no answer and no option to leave a voicemail.

## 2020-05-10 ENCOUNTER — Encounter: Payer: Self-pay | Admitting: *Deleted

## 2020-05-10 NOTE — Telephone Encounter (Signed)
Attempted to call pt multiple times with no answer. Will send a mychart message. Jakki Doughty Kennon Holter, CMA

## 2020-05-26 ENCOUNTER — Other Ambulatory Visit: Payer: Self-pay | Admitting: Family Medicine

## 2020-05-28 ENCOUNTER — Other Ambulatory Visit: Payer: Self-pay | Admitting: Family Medicine

## 2020-05-28 DIAGNOSIS — M159 Polyosteoarthritis, unspecified: Secondary | ICD-10-CM

## 2020-05-31 ENCOUNTER — Other Ambulatory Visit: Payer: Self-pay | Admitting: Family Medicine

## 2020-05-31 NOTE — Progress Notes (Signed)
    SUBJECTIVE:   CHIEF COMPLAINT / HPI: follow up   ALS interpreter present.  Health Maintenance Eye Exam: patient reports appt scheduled for later this month  Just had mammogram earlier this month, recommended for repeat in 1 year   Chronic HTN Disease Monitoring: checks blood pressure throughout the week  Home BP Monitoring - yes  Chest pain- no     Dyspnea- no  Headache - no   Medications: HCTZ 12.5, Coreg, Cardizem,losartan  Compliance-  yes       Lightheadedness-  no               Edema- no   Preventitive Healthcare:  Exercise: no, patient reports walking a lot during her work day    Diabetes Current Regimen: metformin, basaglar, and humalog  Last A1c:  Lab Results  Component Value Date   HGBA1C 6.9 04/02/2020   Denies polyuria, polydipsia, hypoglycemia: rare hypoglycemia when she forgets to eat, no exact number  Last Eye Exam: 01/2019 Statin: lipitor ACE/ARB: losartan   IDA  Patient has hx of IDA and stopped taking iron pills about two months ago due to constipation. Patient reports taking medication daily with plenty of water. She denies any dizziness/lightheadedness.    PERTINENT  PMH / PSH:  HTN  DM    OBJECTIVE:   BP 124/62   Pulse 81   Ht 5\' 4"  (1.626 m)   Wt 146 lb 9.6 oz (66.5 kg)   SpO2 98%   BMI 25.16 kg/m   PE  General: female appearing stated age in no acute distress Cardio: Normal S1 and S2, no S3 or S4. Rhythm is regular. No murmurs or rubs.  Bilateral radial pulses palpable Pulm: Clear to auscultation bilaterally, no crackles, wheezing, or diminished breath sounds. Normal respiratory effort Abdomen:  Abdomen soft and non-tender.  Extremities: No peripheral edema. Warm/ well perfused.  Neuro: pt alert and oriented x4   ASSESSMENT/PLAN:   Hypertension Continue losartan, coreg, and diuretic  Monitor BP 2-3 times per week  BMP  Type 2 diabetes mellitus with ophthalmic complication (HCC) Hgb Z6X was 6.9 and within range last  month  Patient to continue insulin and oral antihyperglycemic medication  Encouraged diet and exercise modifications to keep Blood glucose levels within acceptable ranges  Bmp    Iron deficiency anemia CBC  Discussed taking iron supplements every other day with orange juice to help with constipation   Healthcare maintenance Ophthalmology exam scheduled for later this month.  Will follow up with results when available  Mammogram completed  Patient up to date on influenza and covid vaccines including booster      Eulis Foster, MD [place]

## 2020-06-01 ENCOUNTER — Ambulatory Visit (INDEPENDENT_AMBULATORY_CARE_PROVIDER_SITE_OTHER): Payer: Medicare Other | Admitting: Family Medicine

## 2020-06-01 ENCOUNTER — Other Ambulatory Visit: Payer: Self-pay

## 2020-06-01 ENCOUNTER — Encounter: Payer: Self-pay | Admitting: Family Medicine

## 2020-06-01 VITALS — BP 124/62 | HR 81 | Ht 64.0 in | Wt 146.6 lb

## 2020-06-01 DIAGNOSIS — I10 Essential (primary) hypertension: Secondary | ICD-10-CM

## 2020-06-01 DIAGNOSIS — D500 Iron deficiency anemia secondary to blood loss (chronic): Secondary | ICD-10-CM

## 2020-06-01 DIAGNOSIS — E11311 Type 2 diabetes mellitus with unspecified diabetic retinopathy with macular edema: Secondary | ICD-10-CM

## 2020-06-01 DIAGNOSIS — Z0000 Encounter for general adult medical examination without abnormal findings: Secondary | ICD-10-CM

## 2020-06-01 NOTE — Patient Instructions (Signed)
Today we will check your electrolytes and blood counts.  I will follow up with any abnormal results. Please continue to take your medications as prescribed. Also we will look for any notes from your eye exam coming up next month.  Please continue to check your blood pressure and keep a record of your blood pressure numbers so the we can review them in case you continue to have lower measurements/dizziness.

## 2020-06-02 ENCOUNTER — Other Ambulatory Visit: Payer: Self-pay | Admitting: Family Medicine

## 2020-06-02 DIAGNOSIS — E119 Type 2 diabetes mellitus without complications: Secondary | ICD-10-CM

## 2020-06-02 LAB — BASIC METABOLIC PANEL
BUN/Creatinine Ratio: 25 (ref 12–28)
BUN: 21 mg/dL (ref 8–27)
CO2: 23 mmol/L (ref 20–29)
Calcium: 10.5 mg/dL — ABNORMAL HIGH (ref 8.7–10.3)
Chloride: 101 mmol/L (ref 96–106)
Creatinine, Ser: 0.85 mg/dL (ref 0.57–1.00)
GFR calc Af Amer: 84 mL/min/{1.73_m2} (ref >59)
GFR calc non Af Amer: 73 mL/min/{1.73_m2} (ref >59)
Glucose: 216 mg/dL — ABNORMAL HIGH (ref 65–99)
Potassium: 4.2 mmol/L (ref 3.5–5.2)
Sodium: 139 mmol/L (ref 134–144)

## 2020-06-02 LAB — CBC
Hematocrit: 30.2 % — ABNORMAL LOW (ref 34.0–46.6)
Hemoglobin: 9.7 g/dL — ABNORMAL LOW (ref 11.1–15.9)
MCH: 26.4 pg — ABNORMAL LOW (ref 26.6–33.0)
MCHC: 32.1 g/dL (ref 31.5–35.7)
MCV: 82 fL (ref 79–97)
Platelets: 383 10*3/uL (ref 150–450)
RBC: 3.67 x10E6/uL — ABNORMAL LOW (ref 3.77–5.28)
RDW: 14.6 % (ref 11.7–15.4)
WBC: 5.9 10*3/uL (ref 3.4–10.8)

## 2020-06-02 LAB — IRON: Iron: 18 ug/dL — ABNORMAL LOW (ref 27–139)

## 2020-06-03 NOTE — Assessment & Plan Note (Signed)
CBC  Discussed taking iron supplements every other day with orange juice to help with constipation

## 2020-06-03 NOTE — Assessment & Plan Note (Addendum)
Continue losartan, coreg, and diuretic  Monitor BP 2-3 times per week  BMP

## 2020-06-03 NOTE — Assessment & Plan Note (Signed)
Hgb A1c was 6.9 and within range last month  Patient to continue insulin and oral antihyperglycemic medication  Encouraged diet and exercise modifications to keep Blood glucose levels within acceptable ranges  Bmp

## 2020-06-03 NOTE — Assessment & Plan Note (Signed)
Ophthalmology exam scheduled for later this month.  Will follow up with results when available  Mammogram completed  Patient up to date on influenza and covid vaccines including booster

## 2020-06-07 DIAGNOSIS — M48062 Spinal stenosis, lumbar region with neurogenic claudication: Secondary | ICD-10-CM | POA: Diagnosis not present

## 2020-06-10 ENCOUNTER — Encounter: Payer: Self-pay | Admitting: Family Medicine

## 2020-06-10 ENCOUNTER — Telehealth: Payer: Self-pay

## 2020-06-10 ENCOUNTER — Ambulatory Visit (INDEPENDENT_AMBULATORY_CARE_PROVIDER_SITE_OTHER): Payer: Medicare Other | Admitting: Family Medicine

## 2020-06-10 ENCOUNTER — Other Ambulatory Visit: Payer: Self-pay

## 2020-06-10 VITALS — BP 130/56 | HR 88 | Ht 64.0 in | Wt 147.4 lb

## 2020-06-10 DIAGNOSIS — R739 Hyperglycemia, unspecified: Secondary | ICD-10-CM | POA: Insufficient documentation

## 2020-06-10 LAB — GLUCOSE, POCT (MANUAL RESULT ENTRY): POC Glucose: 251 mg/dl — AB (ref 70–99)

## 2020-06-10 NOTE — Assessment & Plan Note (Addendum)
Blood sugars are acutely more elevated than normal as patient recently had CSI on Monday 12/6.  Her blood sugars have been elevated as high as 600, at today's visit was better at 251.  Patient given the following instructions: 1. Take BasaGlar 10 units in the morning and at night. Only take this for the next few days while your sugars remain elevated.  2. Novolog 10 units three times daily with meals. You can reduce the amount of Novolog you take as your sugars become better under control. NO INSULIN IF SUGAR IS LESS THAN 120. 3. Maintain a low carbohydrate diet to reduce your sugar intake. 4. Patient to be contacted by nursing staff to continue to monitor blood sugars at home, MyChart message was sent to patient but she has not yet responded.  5. Has follow up scheduled Jan 24th 2022 with PCP.

## 2020-06-10 NOTE — Patient Instructions (Addendum)
Thank you for coming in to see Alicia [name] today! Please see below to review our plan for today's visit:  1. Take BasaGlar 10 units in the morning and at night. Only take this for the next few days while your sugars remain elevated.  2. Novolog 10 units three times daily with meals. You can reduce the amount of Novolog you take as your sugars become better under control. NO INSULIN IF SUGAR IS LESS THAN 120. 3. Maintain a low carbohydrate diet to reduce your sugar intake.   Please call the clinic at [phone] if your symptoms worsen or you have any concerns. It was our pleasure to serve you!   Dr. Milus Banister [facility]    Vegetarian Eating Information Many people may prefer vegetarian eating for religious, environmental, or personal reasons. These diets are often lower in calories, salt, sugar, cholesterol, and saturated and trans fats. Vegetarian eating provides significant health benefits. People who eat a vegetarian diet often have lower rates of:  Obesity.  Diabetes.  Breast and colon cancers.  Cardiovascular and gallbladder diseases. What are the types of vegetarian eating?  Vegetarian eating includes dietary choices that focus on eating mostly vegetables and fruit, grains, beans, nuts, and seeds. There are several different types of vegetarian eating. Talk with a diet and nutrition specialist (dietitian) about what type of vegetarian diet is best for you. Lacto-ovo vegetarian  Recommended foods: fruits and vegetables, milk and dairy, eggs, grains, soy and vegetable protein, beans, nuts, and seeds.  Foods to avoid: meat, poultry, seafood, animal-based broths and gravies, and gelatin. Lacto-vegetarian  Recommended foods: fruits and vegetables, milk and dairy, grains, soy and vegetable protein, beans, nuts, and seeds.  Foods to avoid: meat, poultry, seafood, animal-based broths and gravies, gelatin, and eggs. Vegan  Recommended foods: fruits and vegetables,  grains, soy and vegetable protein, beans, nuts, and seeds.  Foods to avoid: meat, poultry, seafood, animal-based broths and gravies, gelatin, eggs, milk and dairy, and honey. What do I need to know about vegetarian eating? All vegetarian diets restrict proteins that come from animals. Foods that come from animals have important nutrients, such as protein, fats, vitamins, and minerals. It is important to get these nutrients from other types of foods. If you think you may not be getting the right nutrients, or if you do not eat any animal products, talk with your health care provider or dietitian about taking supplements. A dietitian can help determine your individual nutrient needs. What are tips for following this plan? Eat a diet that includes a variety of fruits, vegetables, whole grains, and protein sources. This is important to make sure you get enough of the following nutrients: Protein Healthy protein sources include:  Eggs, milk, and cheese. Soy products. Tofu, tempeh, and textured vegetable protein (TVP). Quinoa. Hemp seeds. Other protein sources include:  Beans, such as black beans or kidney beans. Other legumes, such as lentils and split peas. Nuts, such as almonds, [place] nuts, and pecans. Seeds, such as sunflower seeds. To get the most benefit from plant-based proteins, combine two or more sources of plant protein with whole grains in one dish. Examples include beans and rice, almond butter on bread, or sunflower seeds on noodles. Vitamin B12 Sources of vitamin B12 include:  Cheese and eggs. Breakfast cereals and other prepared products that have vitamin B12 added (fortified products). If you eat a vegan diet, ask your health care provider or dietitian about taking a B12 supplement. Vitamin D Good sources of  vitamin D include:  Egg yolks. Fortified dairy products. Fortified orange juice. Mushrooms. Cereals with added vitamin D. Another way of getting vitamin D is to spend 10  minutes each day in the sun. This helps your body make its own vitamin D. Depending on your age, you may need to take a vitamin D supplement. Talk with your health care provider or dietitian about how much vitamin D you need in a supplement. Iron Healthy sources of iron include:  Dark, leafy greens. Nuts. Beans. Grain products that are fortified with iron, such as cereals. Tofu, tempeh, soybeans, and quinoa. To get the most iron from plant-based foods:  Eat iron-containing plant-based foods with vitamin C. For example, squeeze fresh lemon juice over cooked greens like kale, chard, or spinach, or have a glass of orange juice with your meals.  Avoid eating dairy products, coffee, or tea with iron-containing foods. Omega-3 fatty acids Good sources of omega-3 fatty acids include:  Walnuts. Flax seeds, canola oil, soybean oil, and tofu. Avocados. Olives and olive oil. Foods with added omega-3 fatty acids, such as eggs, milk, and juices. Calcium Good sources of calcium include:  Dairy products. Fortified non-dairy milk. Fortified tofu. Dark, leafy greens, such as kale, bok choy, Chinese cabbage, collard greens and mustard greens. Broccoli. Okra. Fortified breakfast cereals and fruit juices. Figs. Zinc Good sources of zinc include:  Pumpkin seeds. Legumes, such as chickpeas, kidney beans, and green peas. Wheat germ, whole grains, and fortified cereals. Mushrooms. Spinach and kale. Milk and dairy foods. Dark chocolate. Summary  Vegetarian eating is a choice made by people who prefer vegetarian eating for religious, environmental, or personal reasons. These diets can provide significant health benefits.  There are several types of vegetarian diets, but all restrict proteins that come from animals.  It is important to make sure that you are getting enough nutrients, including protein, vitamin B12, vitamin D, iron, omega-3 fatty acids, calcium, and zinc from your diet.  If you think you are not  getting the right nutrients or if you do not eat any animal products, talk with your health care provider or dietitian.

## 2020-06-10 NOTE — Telephone Encounter (Signed)
Patient calls nurse line regarding elevated blood sugar readings over the last three days. Patient reports having steroid injection in back on Monday.   CBG levels 300-500s. Patient reports fasting AM reading 399. Patient is currently experiencing some fatigue, otherwise, aysmptomatic.   Advised office visit today to be evaluated by provider. Patient agreeable and scheduled for ATC at 9:30 am.   Attempting to contact interpreter services for in-person interpreter, however due to same day necessity, iPad may need to be used.   Talbot Grumbling, RN

## 2020-06-10 NOTE — Progress Notes (Signed)
    SUBJECTIVE:   CHIEF COMPLAINT / HPI: Elevated sugars since CSI  This encounter was translated with the help of Kacy #[id number]  Elevated blood sugars: Patient reports that on Monday 12/6 she received a steroid injection for her back, and since then she has had elevated blood sugars.  Most recent HbA1c is 6.9%.  She reports that initially her sugars were very elevated as high as 500, 600.  They recently came down to 200.  During the night she can feel her heart racing, checked her blood sugar and her blood sugar was about 300.  This morning her blood sugar was 399.  This morning her CBG is 251 here in the clinic.  She reports that she has not been eating very much because she does not want to make her sugars go very high.  Last night for dinner she had salmon and asparagus.  She has been checking her blood sugars a lot more frequently.  She endorses feeling occasionally dizzy, off balance, when she comes home from work she has to sit down and rest.  The patient reports she takes the following regimen for diabetes: Insulin glargine 10 units at night Victoza in the morning 0.6 mg Metformin 500 mg twice daily NovoLog: She has recently been taking 10 units in the morning with breakfast, 10 units at lunchtime, and 10 units at dinner.   PERTINENT  PMH / PSH:  HLD, T2DM, GERD, HTN, OA  OBJECTIVE:   BP (!) 130/56   Pulse 88   Ht 5\' 4"  (1.626 m)   Wt 147 lb 6 oz (66.8 kg)   SpO2 98%   BMI 25.30 kg/m    Physical exam: General: Well-appearing, very pleasant patient Respiratory: Comfortable work of breathing  ASSESSMENT/PLAN:   Elevated blood sugar Blood sugars are acutely more elevated than normal as patient recently had CSI on Monday 12/6.  Her blood sugars have been elevated as high as 600, at today's visit was better at 251.  Patient given the following instructions: 1. Take BasaGlar 10 units in the morning and at night. Only take this for the next few days while your sugars remain  elevated.  2. Novolog 10 units three times daily with meals. You can reduce the amount of Novolog you take as your sugars become better under control. NO INSULIN IF SUGAR IS LESS THAN 120. 3. Maintain a low carbohydrate diet to reduce your sugar intake. 4. Patient to be contacted by nursing staff to continue to monitor blood sugars at home, MyChart message was sent to patient but she has not yet responded.  5. Has follow up scheduled Jan 24th 2022 with PCP.      [place]

## 2020-06-14 DIAGNOSIS — E113592 Type 2 diabetes mellitus with proliferative diabetic retinopathy without macular edema, left eye: Secondary | ICD-10-CM | POA: Diagnosis not present

## 2020-06-14 DIAGNOSIS — H43813 Vitreous degeneration, bilateral: Secondary | ICD-10-CM | POA: Diagnosis not present

## 2020-06-14 DIAGNOSIS — H35373 Puckering of macula, bilateral: Secondary | ICD-10-CM | POA: Diagnosis not present

## 2020-06-14 DIAGNOSIS — E113591 Type 2 diabetes mellitus with proliferative diabetic retinopathy without macular edema, right eye: Secondary | ICD-10-CM | POA: Diagnosis not present

## 2020-06-16 ENCOUNTER — Telehealth: Payer: Self-pay

## 2020-06-16 NOTE — Telephone Encounter (Signed)
Patient LVM on nurse line this morning regarding blood sugar levels. Patient reports that levels have been consistently in the 200s and has questions about how she should proceed with medications.   Attempted to return call to patient. No answer, left HIPAA compliant VM for patient to return call to office to discuss further.   Talbot Grumbling, RN

## 2020-06-17 NOTE — Telephone Encounter (Signed)
Patient calls nurse line reporting CBGs of 237 last night and 180 this morning. Patient reports Basaglar dose last night and this morning. Will forward to [facility] as an [place].

## 2020-06-23 ENCOUNTER — Other Ambulatory Visit: Payer: Self-pay | Admitting: Family Medicine

## 2020-06-23 DIAGNOSIS — I10 Essential (primary) hypertension: Secondary | ICD-10-CM

## 2020-06-24 ENCOUNTER — Other Ambulatory Visit: Payer: Self-pay

## 2020-06-24 ENCOUNTER — Ambulatory Visit (INDEPENDENT_AMBULATORY_CARE_PROVIDER_SITE_OTHER): Payer: Medicare Other | Admitting: Family Medicine

## 2020-06-24 ENCOUNTER — Encounter: Payer: Self-pay | Admitting: Family Medicine

## 2020-06-24 DIAGNOSIS — S60311A Abrasion of right thumb, initial encounter: Secondary | ICD-10-CM | POA: Diagnosis not present

## 2020-06-24 NOTE — Patient Instructions (Signed)
I recommend continuing to keep your hands moisturized using take lotions or creams.  Eucerin is a Runner, broadcasting/film/video.  After applying lotion, I would apply Vaseline over top just to help fill in the moisture.  Things to look out for return to care would be increased redness or swelling of your thumb, inability to use your thumb, stiffness or if you notice red streaking into your hand or arm as this may be a sign of infection.  Dry Skin Care  What causes dry skin?  Dry skin is common and results from inadequate moisture in the outer skin layers. Dry skin usually results from the excessive loss of moisture from the skin surface. This occurs due to two major factors: 1. Normally the skin's oil glands deposit a layer of oil on the skin's surface. This layer of oil prevents the loss of moisture from the skin. Exposure to soaps, cleaners, solvents, and disinfectants removes this oily film, allowing water to escape. 2. Water loss from the skin increases when the humidity is low. During winter months we spend a lot of time indoors where the air is heated. Heated air has very low humidity. This also contributes to dry skin.  A tendency for dry skin may accompany such disorders as eczema. Also, as people age, the number of functioning oil glands decreases, and the tendency toward dry skin can be a sensation of skin tightness when emerging from the shower.  How do I manage dry skin?  1. Humidify your environment. This can be accomplished by using a humidifier in your bedroom at night during winter months. 2. Bathing can actually put moisture back into your skin if done right. Take the following steps while bathing to sooth dry skin:  Avoid hot water, which only dries the skin and makes itching worse. Use warm water.  Avoid washcloths or extensive rubbing or scrubbing.  Use mild soaps like unscented Dove, Oil of Olay, Cetaphil, Basis, or CeraVe.  If you take baths rather than showers, rinse off soap residue  with clean water before getting out of tub.  Once out of the shower/tub, pat dry gently with a soft towel. Leave your skin damp.  While still damp, apply any medicated ointment/cream you were prescribed to the affected areas. After you apply your medicated ointment/cream, then apply your moisturizer to your whole body.This is the most important step in dry skin care. If this is omitted, your skin will continue to be dry.  The choice of moisturizer is also very important. In general, lotion will not provider enough moisture to severely dry skin because it is water based. You should use an ointment or cream. Moisturizers should also be unscented. Good choices include Vaseline (plain petrolatum), Aquaphor, Cetaphil, CeraVe, Vanicream, DML Forte, Aveeno moisture, or Eucerin Cream.  Bath oils can be helpful, but do not replace the application of moisturizer after the bath. In addition, they make the tub slippery causing an increased risk for falls. Therefore, we do not recommend their use.

## 2020-06-24 NOTE — Progress Notes (Signed)
    SUBJECTIVE:   CHIEF COMPLAINT / HPI: right thumb pain   Patient is present with American sign language interpreter.  Patient states that the skin on the top of her right thumb is been very dry and hard to keep moisturized because of the cold weather and having to frequently wash her hands.  She reports that yesterday small area of skin split and began to bleed.  She reports having a hard time stopping the bleeding.  Since then she has been trying to keep it moisturized with lotion and keeping it covered with a bandage.  She denies any numbness or difficulty moving her thumb.  She reports is difficult to try to cook with a as she has limited use get into the car.  The area did appear red at the time.  She denies any swelling.  She has tried to use Neosporin and different types of ointments on the area.  PERTINENT  PMH / PSH:  DM   OBJECTIVE:   BP (!) 158/62   Pulse 75   Wt 143 lb 6.4 oz (65 kg)   SpO2 97%   BMI 24.61 kg/m    Media Information         Document Information  Photos  Right Thumb  06/24/2020 15:58  Attached To:  Office Visit on 06/24/20 with Eulis Foster, MD   Source Information  Simmons-Robinson, Riki Sheer, MD  [facility] Resident     ASSESSMENT/PLAN:   Abrasion of skin of right thumb Based on appearance and mechanism of injury, this is complication of prolonged dry skin. 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 On exam today, there is no bleeding, no purulent drainage, there is no streaking into her hand or arm, she demonstrates normal ROM of her thumb and there is no edema. No warmth to touch. Do not think this is infection/cellulitis  - counseled patient on signs to return to care including swelling, redness, purulent drainage or inability to stop bleeding  -counseled on moisturizing hands  frequently after every hand wash by using thick emollients and sealing in moisture with vaseline or eucerin     Eulis Foster, MD [place]

## 2020-06-25 ENCOUNTER — Encounter: Payer: Self-pay | Admitting: Family Medicine

## 2020-06-25 DIAGNOSIS — S60311A Abrasion of right thumb, initial encounter: Secondary | ICD-10-CM | POA: Insufficient documentation

## 2020-06-25 DIAGNOSIS — K5904 Chronic idiopathic constipation: Secondary | ICD-10-CM | POA: Insufficient documentation

## 2020-06-25 NOTE — Assessment & Plan Note (Signed)
Based on appearance and mechanism of injury, this is complication of prolonged dry skin. 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 On exam today, there is no bleeding, no purulent drainage, there is no streaking into her hand or arm, she demonstrates normal ROM of her thumb and there is no edema. No warmth to touch. Do not think this is infection/cellulitis  - counseled patient on signs to return to care including swelling, redness, purulent drainage or inability to stop bleeding  -counseled on moisturizing hands frequently after every hand wash by using thick emollients and sealing in moisture with vaseline or eucerin

## 2020-06-30 DIAGNOSIS — M48062 Spinal stenosis, lumbar region with neurogenic claudication: Secondary | ICD-10-CM | POA: Diagnosis not present

## 2020-06-30 DIAGNOSIS — I10 Essential (primary) hypertension: Secondary | ICD-10-CM | POA: Diagnosis not present

## 2020-07-12 ENCOUNTER — Ambulatory Visit (INDEPENDENT_AMBULATORY_CARE_PROVIDER_SITE_OTHER): Payer: Medicare Other | Admitting: Podiatry

## 2020-07-12 ENCOUNTER — Other Ambulatory Visit: Payer: Self-pay

## 2020-07-12 DIAGNOSIS — M79675 Pain in left toe(s): Secondary | ICD-10-CM | POA: Diagnosis not present

## 2020-07-12 DIAGNOSIS — M792 Neuralgia and neuritis, unspecified: Secondary | ICD-10-CM | POA: Diagnosis not present

## 2020-07-12 DIAGNOSIS — B351 Tinea unguium: Secondary | ICD-10-CM | POA: Diagnosis not present

## 2020-07-12 MED ORDER — NONFORMULARY OR COMPOUNDED ITEM
3 refills | Status: DC
Start: 2020-07-12 — End: 2020-10-27

## 2020-07-12 MED ORDER — DICLOFENAC SODIUM 1 % EX GEL: 2.0000 g | Freq: Four times a day (QID) | CUTANEOUS | 2 refills | Status: DC

## 2020-07-14 NOTE — Progress Notes (Signed)
Subjective: 65 year old female presents the office with interpreter for evaluation of left foot pain.  She said that she continues have discomfort mostly to her big toes and she gets sharp pain which is elevated at nighttime.  She also describes burning discomfort in the toes.  She states the pain is worse when she does a lot of standing or walking.  States that when she bends her big toe it hurts some.  She did purchase some over-the-counter nerve cream which helps some.  Also concern about her nails becoming thickened discolored.  She denies any recent injury or falls.  No back pain or radiating pain otherwise.  Objective: AAO x3, NAD DP/PT pulses palpable bilaterally, CRT less than 3 seconds Hallux toenails are mildly hypertrophic and dystrophic we will discoloration.  There is no hyperpigmentation.  No pain, redness or drainage or any signs of infection. Tenderness palpation mostly on the medial aspect of the first MPJ bilaterally.  There is no area pinpoint tenderness.  No edema, erythema.  No crepitation with MPJ range of motion.  Flexor, extensor tendons appear to be intact.  MMT 5/5. No pain with calf compression, swelling, warmth, erythema  Assessment: Resolved left foot pain; right hallux onychomycosis  Plan: -All treatment options discussed with the patient including all alternatives, risks, complications.  -Order compound cream today through [facility] for onychomycosis -Continue to the pain that she is getting is more related to biomechanical changes.  Dispensed power step inserts.  She could also use Voltaren gel to the area as needed.  Although given the burning that she gets could not rule out possible neuropathy and discussed possibly starting gabapentin if needed.  With treating as a biomechanical issue for now. -Patient encouraged to call the office with any questions, concerns, change in symptoms.   Trula Slade DPM

## 2020-07-26 ENCOUNTER — Ambulatory Visit (INDEPENDENT_AMBULATORY_CARE_PROVIDER_SITE_OTHER): Payer: Medicare Other | Admitting: Family Medicine

## 2020-07-26 ENCOUNTER — Other Ambulatory Visit: Payer: Self-pay

## 2020-07-26 ENCOUNTER — Encounter: Payer: Self-pay | Admitting: Family Medicine

## 2020-07-26 VITALS — BP 124/72 | HR 91 | Wt 143.0 lb

## 2020-07-26 DIAGNOSIS — E119 Type 2 diabetes mellitus without complications: Secondary | ICD-10-CM

## 2020-07-26 DIAGNOSIS — Z794 Long term (current) use of insulin: Secondary | ICD-10-CM | POA: Diagnosis not present

## 2020-07-26 DIAGNOSIS — E11311 Type 2 diabetes mellitus with unspecified diabetic retinopathy with macular edema: Secondary | ICD-10-CM

## 2020-07-26 LAB — POCT GLYCOSYLATED HEMOGLOBIN (HGB A1C): Hemoglobin A1C: 7.8 % — AB (ref 4.0–5.6)

## 2020-07-26 MED ORDER — VICTOZA 18 MG/3ML ~~LOC~~ SOPN: 1.2000 mg | PEN_INJECTOR | SUBCUTANEOUS | 1 refills | Status: DC

## 2020-07-26 MED ORDER — BASAGLAR KWIKPEN 100 UNIT/ML ~~LOC~~ SOPN: 10.0000 [IU] | PEN_INJECTOR | Freq: Every day | SUBCUTANEOUS | 4 refills | Status: DC

## 2020-07-26 NOTE — Progress Notes (Signed)
    SUBJECTIVE:   CHIEF COMPLAINT / HPI: Diabetes American sign language interpreter present for encounter  Diabetes Current Regimen: Metformin 1000 mg twice daily, Victoza in Basaglar 10 units daily with NovoLog 1 to 4 units 3 times a day with meals Last A1c: on 04/2020, 6.9 Denies polyuria, polydipsia, hypoglycemia  Last Eye Exam: Due for annual diabetes eye exam, follows with ophthalmology for retinal pathology Statin: Crestor ACE/ARB: Losartan Patient states she had CBG of 79 for her fasting level this morning.  She is requesting refills for Victoza and Basaglar.  PERTINENT  PMH / PSH: Diabetes  OBJECTIVE:   BP 124/72   Pulse 91   Wt 143 lb (64.9 kg)   SpO2 98%   BMI 24.55 kg/m   General: Female appearing stated age in no acute distress Cardio: Normal S1 and S2, no S3 or S4. Rhythm is regular. No murmurs or rubs.  Bilateral radial pulses palpable Pulm: Clear to auscultation bilaterally, no crackles, wheezing, or diminished breath sounds. Normal respiratory effort Abdomen: Bowel sounds normal. Abdomen soft and non-tender.  Extremities: No peripheral edema. Warm & well perfused.   ASSESSMENT/PLAN:   Diabetes ([place])  -Patient to continue diabetes regimen including 10 units of Basaglar daily, NovoLog 1-4 units with meals, Victoza and Metformin 1000 mg twice daily -Check hemoglobin A1c today, last hemoglobin A1c 6.9 in 2021/October -Hemoglobin A1c elevated to 7.8 from 6.9 -We will increase dose of Victoza to 1.2 -We will reach out to patient to follow-up in 6 weeks rather than 6 months      Eulis Foster, MD [place]

## 2020-07-26 NOTE — Assessment & Plan Note (Signed)
>>  ASSESSMENT AND PLAN FOR DIABETES ([facility]) WRITTEN ON 07/28/2020  7:35 AM BY MARCINE COYER, MD   -Patient to continue diabetes regimen including 10 units of Basaglar  daily, NovoLog  1-4 units with meals, Victoza  and Metformin  1000 mg twice daily -Check hemoglobin A1c today, last hemoglobin A1c 6.9 in 2021/October -Hemoglobin A1c elevated to 7.8 from 6.9 -We will increase dose of Victoza  to 1.2 -We will reach out to patient to follow-up in 6 weeks rather than 6 months

## 2020-07-26 NOTE — Assessment & Plan Note (Addendum)
-  Patient to continue diabetes regimen including 10 units of Basaglar daily, NovoLog 1-4 units with meals, Victoza and Metformin 1000 mg twice daily -Check hemoglobin A1c today, last hemoglobin A1c 6.9 in 2021/October -Hemoglobin A1c elevated to 7.8 from 6.9 -We will increase dose of Victoza to 1.2 -We will reach out to patient to follow-up in 6 weeks rather than 6 months

## 2020-07-26 NOTE — Patient Instructions (Addendum)
It was a pleasure to see you today!  Thank you for choosing [facility] for your primary care.  Alicia [name] was seen for diabetes follow up.   Our plans for today were:  Diabetes: Your hemoglobin A1c was slightly elevated from prior 6.9 at 7.8 today.  I have increased the dose of your Victoza from 0.6-1.2.  Please continue to take this as prescribed once weekly.  I have reviewed your lab results from 06/01/2020 and your creatinine was 0.85 which is well within normal range.  I do not think we need to recheck this level today as it was recently checked and is normal.   To keep you healthy, please keep in mind the following health maintenance items that you are due for:   1. Diabetes Eye Exam     You should return to our clinic in 6 months or sooner as needed.   Best Wishes,   Dr. Alba Cory

## 2020-07-28 ENCOUNTER — Encounter: Payer: Self-pay | Admitting: Family Medicine

## 2020-07-29 ENCOUNTER — Telehealth: Payer: Self-pay | Admitting: *Deleted

## 2020-07-29 ENCOUNTER — Other Ambulatory Visit: Payer: Self-pay | Admitting: Cardiology

## 2020-07-29 NOTE — Telephone Encounter (Signed)
Called patient and left VMessage  that per Dr Jacqualyn Posey , she may purchase the medication Diclofenac Sodium Gel OTC.

## 2020-08-17 ENCOUNTER — Other Ambulatory Visit: Payer: Self-pay | Admitting: Family Medicine

## 2020-08-21 ENCOUNTER — Other Ambulatory Visit: Payer: Self-pay | Admitting: Physician Assistant

## 2020-08-22 ENCOUNTER — Other Ambulatory Visit: Payer: Self-pay | Admitting: Family Medicine

## 2020-08-23 ENCOUNTER — Ambulatory Visit (INDEPENDENT_AMBULATORY_CARE_PROVIDER_SITE_OTHER): Payer: Medicare Other | Admitting: Podiatry

## 2020-08-23 ENCOUNTER — Other Ambulatory Visit: Payer: Self-pay

## 2020-08-23 ENCOUNTER — Telehealth: Payer: Self-pay

## 2020-08-23 DIAGNOSIS — M722 Plantar fascial fibromatosis: Secondary | ICD-10-CM

## 2020-08-23 DIAGNOSIS — M792 Neuralgia and neuritis, unspecified: Secondary | ICD-10-CM | POA: Diagnosis not present

## 2020-08-23 MED ORDER — NONFORMULARY OR COMPOUNDED ITEM: 3 refills | Status: DC

## 2020-08-23 NOTE — Telephone Encounter (Signed)
Prescription for neuropathy cream faxed to [facility]

## 2020-08-27 NOTE — Progress Notes (Signed)
Subjective: 65 year old female presents today with interpreter for follow-up evaluation of left foot pain.  She states that she is doing somewhat better.  After discussing today with her more seen tingling to her feet with the left side worse than the right.  She has been seen back doctor as well with no injections in her back.  She is currently on gabapentin.  She does state that the power steps to help her foot discomfort as well.  No recent injury or falls or weakness.  No other concerns. Denies any systemic complaints such as fevers, chills, nausea, vomiting. No acute changes since last appointment, and no other complaints at this time.   Objective: AAO x3, NAD DP/PT pulses palpable bilaterally, CRT less than 3 seconds There is no area of pinpoint tenderness identified today.  She describes more of a tingling sensation to her foot.  There is no edema, erythema.  Flexor, extensor tendons appear to be intact.  Subjectively there is some discomfort on the arch of the foot on the plantar fascial but no area of tenderness otherwise.  Plantar fascial and Achilles tendon appear to be intact.  MMT 5/5. No pain with calf compression, swelling, warmth, erythema  Assessment: 65 year old female with plantar fasciitis, concern for neuropathy/nerve issues possibly coming from back  Plan: -All treatment options discussed with the patient including all alternatives, risks, complications.  -I do believe that her symptoms are multifactorial.  I would like for her to continue the power steps, stretching, icing daily. -From a nerve standpoint she is already on gabapentin.  I ordered a compound cream today through [facility].  Also recommend for her to follow-up with her back doctor as well.  She has not mentioned the tingling to her feet. -Patient encouraged to call the office with any questions, concerns, change in symptoms.   Trula Slade DPM

## 2020-08-30 ENCOUNTER — Telehealth: Payer: Self-pay | Admitting: Podiatry

## 2020-08-30 NOTE — Telephone Encounter (Signed)
This has been completed.

## 2020-08-30 NOTE — Telephone Encounter (Signed)
Patient calling to inform Dr Jacqualyn Posey, Dr Lenord Carbo did her back surgery.

## 2020-08-30 NOTE — Telephone Encounter (Signed)
If not already done, can one of you please send my last clinic note to him? Thanks.

## 2020-08-31 DIAGNOSIS — H43813 Vitreous degeneration, bilateral: Secondary | ICD-10-CM | POA: Diagnosis not present

## 2020-08-31 DIAGNOSIS — H35373 Puckering of macula, bilateral: Secondary | ICD-10-CM | POA: Diagnosis not present

## 2020-08-31 DIAGNOSIS — H35033 Hypertensive retinopathy, bilateral: Secondary | ICD-10-CM | POA: Diagnosis not present

## 2020-08-31 DIAGNOSIS — E113513 Type 2 diabetes mellitus with proliferative diabetic retinopathy with macular edema, bilateral: Secondary | ICD-10-CM | POA: Diagnosis not present

## 2020-09-02 ENCOUNTER — Other Ambulatory Visit: Payer: Self-pay | Admitting: Physician Assistant

## 2020-09-03 DIAGNOSIS — M48062 Spinal stenosis, lumbar region with neurogenic claudication: Secondary | ICD-10-CM | POA: Diagnosis not present

## 2020-09-07 ENCOUNTER — Other Ambulatory Visit: Payer: Self-pay | Admitting: Family Medicine

## 2020-09-07 DIAGNOSIS — M159 Polyosteoarthritis, unspecified: Secondary | ICD-10-CM

## 2020-09-08 ENCOUNTER — Other Ambulatory Visit: Payer: Self-pay | Admitting: Family Medicine

## 2020-09-08 NOTE — Telephone Encounter (Signed)
Valsartan 160 substituted for 100mg  losartan as losartan on backorder.

## 2020-09-09 ENCOUNTER — Telehealth: Payer: Self-pay | Admitting: Podiatry

## 2020-09-09 NOTE — Telephone Encounter (Signed)
Patient called regarding collaboration between you and another provider in reference to treatment plan and prescriptions that were prescribed. Patient called in with translator, Please Advise

## 2020-09-14 ENCOUNTER — Other Ambulatory Visit: Payer: Self-pay | Admitting: Family Medicine

## 2020-09-14 DIAGNOSIS — Z794 Long term (current) use of insulin: Secondary | ICD-10-CM

## 2020-09-15 ENCOUNTER — Other Ambulatory Visit: Payer: Self-pay

## 2020-09-15 ENCOUNTER — Ambulatory Visit (INDEPENDENT_AMBULATORY_CARE_PROVIDER_SITE_OTHER): Payer: Medicare Other | Admitting: Pharmacist

## 2020-09-15 DIAGNOSIS — E119 Type 2 diabetes mellitus without complications: Secondary | ICD-10-CM | POA: Diagnosis not present

## 2020-09-15 DIAGNOSIS — Z794 Long term (current) use of insulin: Secondary | ICD-10-CM

## 2020-09-15 DIAGNOSIS — E11311 Type 2 diabetes mellitus with unspecified diabetic retinopathy with macular edema: Secondary | ICD-10-CM

## 2020-09-15 LAB — POCT CBG (FASTING - GLUCOSE)-MANUAL ENTRY
Glucose Fasting, POC: 38 mg/dL — AB (ref 70–99)
Glucose Fasting, POC: 59 mg/dL — AB (ref 70–99)
Glucose Fasting, POC: 90 mg/dL (ref 70–99)

## 2020-09-15 NOTE — Progress Notes (Signed)
Subjective:    Patient ID: Alicia [name], female    DOB: 04-27-1956, 65 y.o.   MRN: [id number]  HPI Patient is a 65 y.o. female who presents for medication management from follow-up call to nurse line on 09/13/20 in which there was confusing regarding dosing and administration of her Liraglutide (Victoza) and insulin.  She is in good spirits and presents with assistance of in-person [place] Sign Language Intepreter. Patient was last seen by Primary Care Provider 07/26/20  Patient reported to nurse line that she was out of her Liraglutide (Victoza) for over a month because the pharmacy only gave her one pen back in January. She states she spoke to the pharmacy and they claim to have given her enough, therefore it is too soon to fill.  Current diabetes medications include: Liraglutide (Victoza) 1.2mg , Novolog 100 units/mL 1-4 units TID with meals, metformin 1000mg  BID, Basaglar 100 units/mL 8 units daily Patient states that she is taking her diabetes medications as prescribed. Patient reports adherence with medications, besides Victoza due to running out. She states that she does not miss doses of her medications.  During appt patient mentions she does not feel well and that she did not eat breakfast. She believes her blood glucose is low. After treating hypoglycemic event with glucose gel x1 patient did not immediately feel better. It wasn't until glucose gel x2 when patient's blood glucose level was >70 that symptoms began to improve and patient requested to wrap up appointment so she could go home and eat. Patient reports this hypoglycemic event is unusual for her, and that when she checked her blood glucose this AM upon awakening it was 109.  Objective:   Lab performed in-office POC and her glucose was found to be 38. Patient was subsequently given glucose gel and monitored. 30 minutes later POC glucose re-check was found to be 58. Process of glucose gel was repeated. 30 minute POC glucose  re-check was then found to be 90. Patient given several hard candies to take with her.   Lab Results  Component Value Date   HGBA1C 7.8 (A) 07/26/2020   HGBA1C 6.9 04/02/2020   HGBA1C 7.5 (A) 12/05/2019   Lipid Panel     Component Value Date/Time   CHOL 176 08/07/2019 0806   TRIG 49 08/07/2019 0806   HDL 88 08/07/2019 0806   CHOLHDL 2.0 08/07/2019 0806   CHOLHDL 2.3 07/28/2016 0936   VLDL 12 07/28/2016 0936   LDLCALC 78 08/07/2019 0806   LDLDIRECT 78 07/10/2013 1441    PHQ-9 Score: 6  Assessment/Plan:   T2DM is uncontrolled based on event of severe in-office hypoglycemia. Medication adherence appears optimal. Confusion from nurse call on 09/13/20 makes it hard to assess if medication administration is truly optimal. Additional pharmacotherapy is not warranted due to hypoglycemic event. Discussed patient with Dr. Dorris Singh, MD, who recommended patient be stopped on all injectables for the time being due to confusion with medications as well as severe hypoglycemic event during visit. Following instruction patient verbalized understanding of treatment plan, although expressed that she was unhappy with stopping all of her injectable medications. Plan is to closely monitor patient and re-initiate diabetes therapy as appropriate.  1. Discontinued basal insulin Basaglar 8 units 2. Discontinued  rapid insulin Novolog 1-4 units TID with meals  3. Discontinued GLP-1 Victoza (liraglutide) 1.2mg   4. Continue all other medications as prescribed  5. Counseled on s/sx of and management of hypoglycemia 6. Next A1C anticipated 10/24/20.   Follow-up  appointment with pharmacy clinic on 09/20/20 to review sugar readings. Written patient instructions provided.  This appointment required 90 minutes of patient care (this includes precharting, chart review, review of results, and face-to-face care).  Thank you for involving pharmacy to assist in providing this patient's care.

## 2020-09-15 NOTE — Patient Instructions (Signed)
Alicia [name] it was a pleasure seeing you today.   Please do the following:  1. STOP all insulin (Basaglar and Novolog) as directed today during your appointment. If you have any questions or if you believe something has occurred because of this change, call me or your doctor to let one of [name] know.  2. STOP Victoza  as directed today during your appointment. If you have any questions or if you believe something has occurred because of this change, call me or your doctor to let one of [name] know.  3. Continue checking blood sugars at home. It's really important that you record these and bring these in to your next doctor's appointment.  4. Continue making the lifestyle changes we've discussed together during our visit. Diet and exercise play a significant role in improving your blood sugars.  5. Follow-up with me on on 09/20/20 at 9:00 AM  Hypoglycemia or low blood sugar:   Low blood sugar can happen quickly and may become an emergency if not treated right away.   While this shouldn't happen often, it can be brought upon if you skip a meal or do not eat enough. Also, if your insulin or other diabetes medications are dosed too high, this can cause your blood sugar to go to low.   Warning signs of low blood sugar include: 1. Feeling shaky or dizzy 2. Feeling weak or tired  3. Excessive hunger 4. Feeling anxious or upset  5. Sweating even when you aren't exercising  What to do if I experience low blood sugar? Follow the Rule of 15 1. Check your blood sugar with your meter. If lower than 70, proceed to step 2.  2. Treat with 15 grams of fast acting carbs which is found in 3-4 glucose tablets. If none are available you can try hard candy, 1 tablespoon of sugar or honey,4 ounces of fruit juice, or 6 ounces of REGULAR soda.  6. Re-check your sugar in 15 minutes. If it is still below 70, do what you did in step 2 again. If your blood sugar has come back up, go ahead and eat a snack or small meal made up  of complex carbs (ex. Whole grains) and protein at this time to avoid recurrence of low blood sugar.

## 2020-09-16 DIAGNOSIS — H35373 Puckering of macula, bilateral: Secondary | ICD-10-CM | POA: Diagnosis not present

## 2020-09-16 DIAGNOSIS — H43813 Vitreous degeneration, bilateral: Secondary | ICD-10-CM | POA: Diagnosis not present

## 2020-09-16 DIAGNOSIS — E113511 Type 2 diabetes mellitus with proliferative diabetic retinopathy with macular edema, right eye: Secondary | ICD-10-CM | POA: Diagnosis not present

## 2020-09-16 DIAGNOSIS — E113592 Type 2 diabetes mellitus with proliferative diabetic retinopathy without macular edema, left eye: Secondary | ICD-10-CM | POA: Diagnosis not present

## 2020-09-17 ENCOUNTER — Encounter: Payer: Self-pay | Admitting: Pharmacist

## 2020-09-17 ENCOUNTER — Ambulatory Visit (INDEPENDENT_AMBULATORY_CARE_PROVIDER_SITE_OTHER): Payer: Medicare Other | Admitting: Pharmacist

## 2020-09-17 ENCOUNTER — Telehealth: Payer: Self-pay | Admitting: Family Medicine

## 2020-09-17 ENCOUNTER — Other Ambulatory Visit: Payer: Self-pay

## 2020-09-17 DIAGNOSIS — E11311 Type 2 diabetes mellitus with unspecified diabetic retinopathy with macular edema: Secondary | ICD-10-CM

## 2020-09-17 LAB — POCT CBG (FASTING - GLUCOSE)-MANUAL ENTRY: Glucose Fasting, POC: 468 mg/dL — AB (ref 70–99)

## 2020-09-17 MED ORDER — INSULIN LISPRO (1 UNIT DIAL) 100 UNIT/ML (KWIKPEN): 1.0000 [IU] | PEN_INJECTOR | Freq: Three times a day (TID) | SUBCUTANEOUS | 0 refills | Status: DC

## 2020-09-17 NOTE — Progress Notes (Signed)
   humalog - [id number] DA, 03/02/2022 S:     Chief Complaint  Patient presents with  . Medication Management    Diabetes, hyperglycemia/insulin restart    Patient arrives unaccompanied and in good spirits, but worried about high blood sugars in the last day. Patient communicated that they are comfortable proceeding without an interpreter. Presents for diabetes evaluation, education, and management. Patient was referred on 09/16/2020.  Patient was last seen by Primary Care Provider on 07/26/2020.   Medication adherence reported to be good, however has not been taking insulin since last visit per provider.   Current diabetes medications include:  metformin 1000mg  BID, Recently stopped basaglar (glargine), Novolog (insulin aspart) and Victoza (liraglutide).  Current hypertension medications include: carvedilol 25 mg BID, HCTZ 12.5 mg daily, valsartan 160mg  HS Current hyperlipidemia medications include: rosuvastatin 5mg  twice a week.  Patient denies hypoglycemic events. Patient did not eat at all yesterday or this morning because she was afraid it would raise her blood sugar too high. She stated that eating half of a banana on Wednesday raised her blood sugar from 465 to 511. Patient reports that she did not take any insulin this morning and her blood sugar was 109.    O:  Physical Exam Constitutional:      Appearance: Normal appearance.  Pulmonary:     Effort: Pulmonary effort is normal.  Neurological:     Mental Status: She is alert.  Psychiatric:        Mood and Affect: Mood normal.      Review of Systems  Genitourinary: Positive for frequency.  Psychiatric/Behavioral: The patient is nervous/anxious.      Lab Results  Component Value Date   HGBA1C 7.8 (A) 07/26/2020   There were no vitals filed for this visit.  Lipid Panel     Component Value Date/Time   CHOL 176 08/07/2019 0806   TRIG 49 08/07/2019 0806   HDL 88 08/07/2019 0806   CHOLHDL 2.0 08/07/2019 0806   CHOLHDL  2.3 07/28/2016 0936   VLDL 12 07/28/2016 0936   LDLCALC 78 08/07/2019 0806   LDLDIRECT 78 07/10/2013 1441   400s and 500s Home fasting blood sugars this AM reported as 109 (However, patient states she has not eaten since two nights ago.)  In OFFICE CBG = 468   A/P: Diabetes longstanding with insulin dependency.  Likely LADA.  Since stopping insulin Wed she has had recurrent readings > 300 and patient reports not eating yesterday (Thursday) due to fear of hyperglycemia. Able to communicate appropriate hypoglycemia management plan. -Restarted basal insulin Basaglar  (insulin glargine). Previously reported dose was 10 units once daily in the evening. Reduced dose to 6 units once daily (patient preference to take in the evening.  Dose taken in office.  -Gave samples of  rapid insulin Humalog PEN (insulin lispro) at 1-2 units prior to meals (previously 1-4 units prior to meals with novolog -vial and syringe).   -Continued  To hold GLP-1 Victoza (generic name liraglutide) until next visit.  -Extensively discussed pathophysiology of diabetes, recommended lifestyle interventions, dietary effects on blood sugar control -Counseled on s/sx of and management of hypoglycemia  Written patient instructions provided.  Total time in face to face counseling 35 minutes.   Follow up Pharmacist Clinic Visit in 09/20/2020.   Patient seen with Inis Sizer, PharmD Candidate, and Shauna Hugh, PharmD - PGY-1 Resident.

## 2020-09-17 NOTE — Telephone Encounter (Signed)
I called and left a VM through the interpreter  service.

## 2020-09-17 NOTE — Patient Instructions (Signed)
It was great seeing you today!   Restart insulin aspart (Humalog) 1-2 units three times a day with meals (see below for sliding scale)  - Blood sugar of  80-120 = 1 unit  - Blood sugar of 120 or higher = 2 units Remember, we are replacing your Novolog vial with a Humalog pen.  Restart insulin glargine (Basaglar) 6 units daily at night Please wait to restart Victoza until we see you on Monday.  We will see you on Monday. Please call with any questions.

## 2020-09-17 NOTE — Assessment & Plan Note (Signed)
Diabetes longstanding with insulin dependency.  Likely LADA.  Since stopping insulin Wed she has had recurrent readings > 300 and patient reports not eating yesterday (Thursday) due to fear of hyperglycemia. Able to communicate appropriate hypoglycemia management plan. -Restarted basal insulin Basaglar  (insulin glargine). Previously reported dose was 10 units once daily in the evening. Reduced dose to 6 units once daily (patient preference to take in the evening.  Dose taken in office.  -Gave samples of  rapid insulin Humalog PEN (insulin lispro) at 1-2 units prior to meals (previously 1-4 units prior to meals with novolog -vial and syringe).   -Continued  To hold GLP-1 Victoza (generic name liraglutide) until next visit.  -Extensively discussed pathophysiology of diabetes, recommended lifestyle interventions, dietary effects on blood sugar control -Counseled on s/sx of and management of hypoglycemia

## 2020-09-17 NOTE — Telephone Encounter (Signed)
Contacted patient via telephone using interpreter.  Patient states that her blood glucose was 511 this morning when she last checked and she is concerned.  Patient states that she is currently having repairs done to her car and not able to travel at this time.  Asked patient if she would be able to come into the [facility] medicine center to work with Dr. Valentina Lucks on her insulin dosing as well as pick up her insulin it was left here from her last appointment.  Patient reports that her car should be fixed around 940 this morning and she has a 10:00 appointment.  She reports that she would be able to report to the family medicine center after her appointment finishes around 40.  Requested if patient message me via MyChart if she is unable to make it to our clinic.  Patient agreed with this plan.

## 2020-09-18 ENCOUNTER — Encounter: Payer: Self-pay | Admitting: Family Medicine

## 2020-09-18 ENCOUNTER — Telehealth: Payer: Self-pay | Admitting: Family Medicine

## 2020-09-18 LAB — BASIC METABOLIC PANEL WITH GFR
BUN/Creatinine Ratio: 23 (ref 12–28)
BUN: 20 mg/dL (ref 8–27)
CO2: 18 mmol/L — ABNORMAL LOW (ref 20–29)
Calcium: 10.5 mg/dL — ABNORMAL HIGH (ref 8.7–10.3)
Chloride: 97 mmol/L (ref 96–106)
Creatinine, Ser: 0.86 mg/dL (ref 0.57–1.00)
Glucose: 539 mg/dL (ref 65–99)
Potassium: 4.6 mmol/L (ref 3.5–5.2)
Sodium: 136 mmol/L (ref 134–144)
eGFR: 75 mL/min/1.73 (ref >59)

## 2020-09-18 IMAGING — US US ABDOMEN LIMITED
1 series · 14 of 25 positions shown · non-contrast
Comparison: None.

CLINICAL DATA: Pain

EXAM:
ULTRASOUND ABDOMEN LIMITED RIGHT UPPER QUADRANT

[Series 1: us abdomen limited · 14 of 54 slices shown]
[im 1/54]
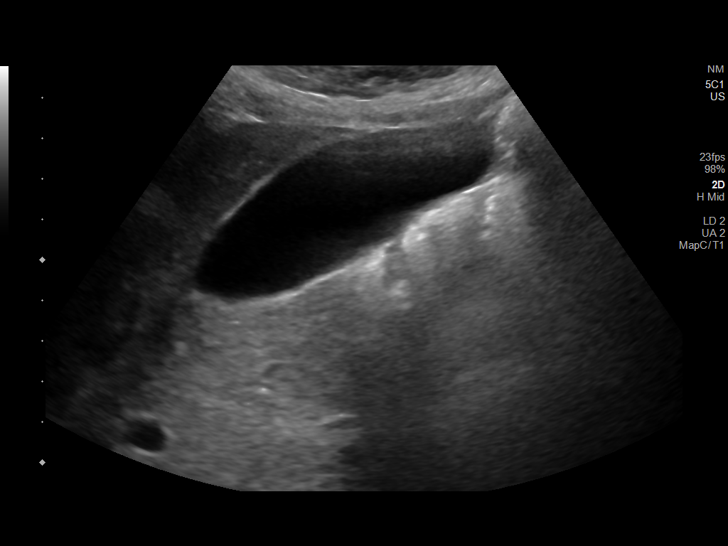
[im 5/54]
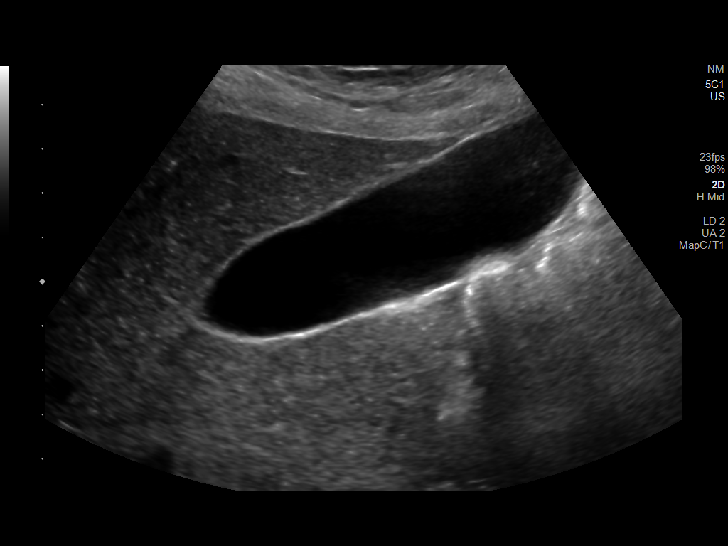
[im 9/54]
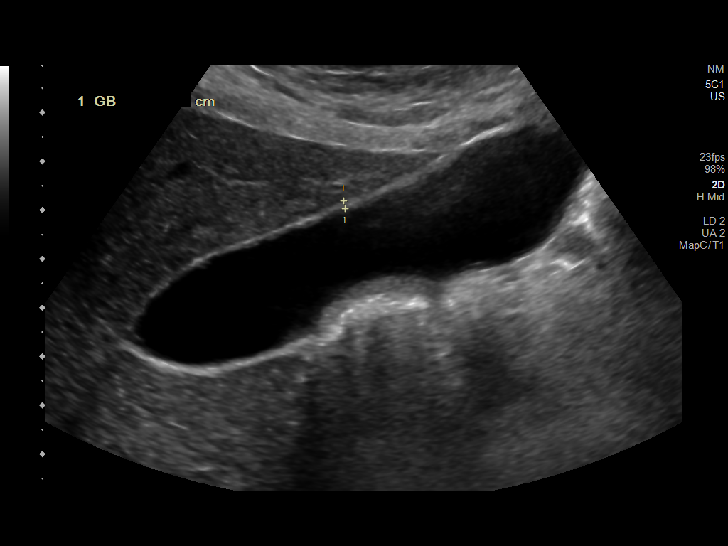
[im 14/54]
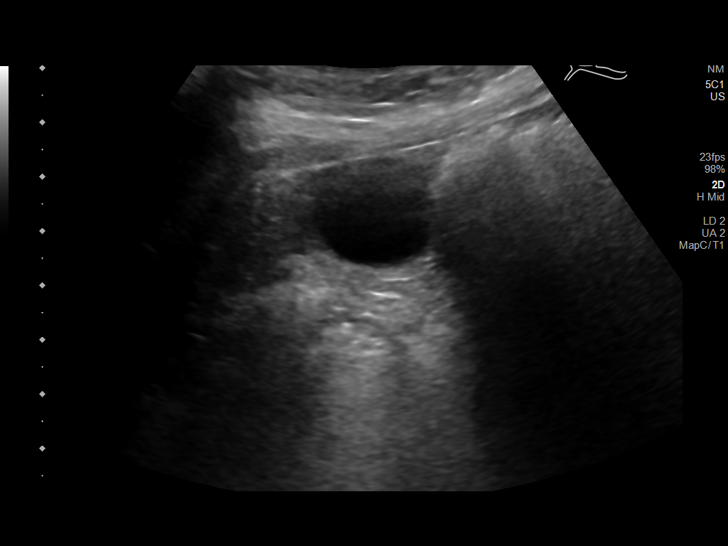
[im 18/54]
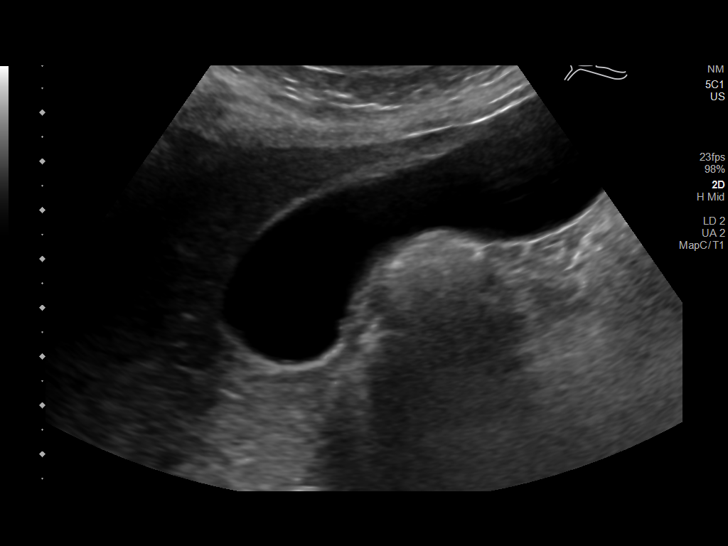
[im 20/54]
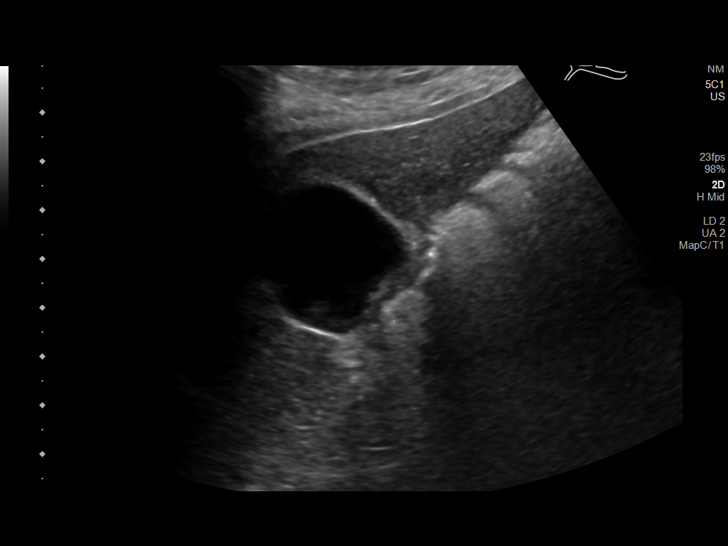
[im 25/54]
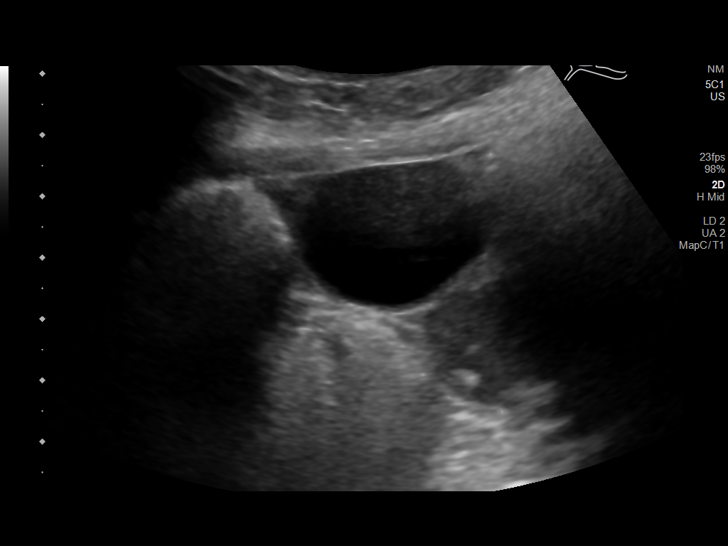
[im 29/54]
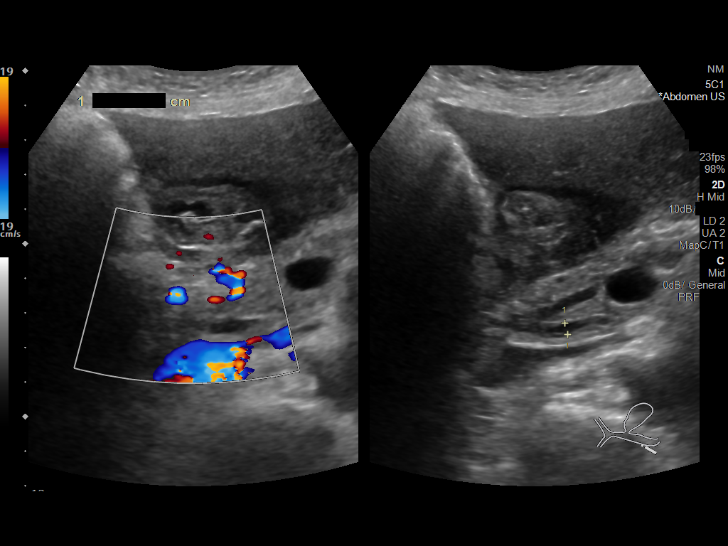
[im 34/54]
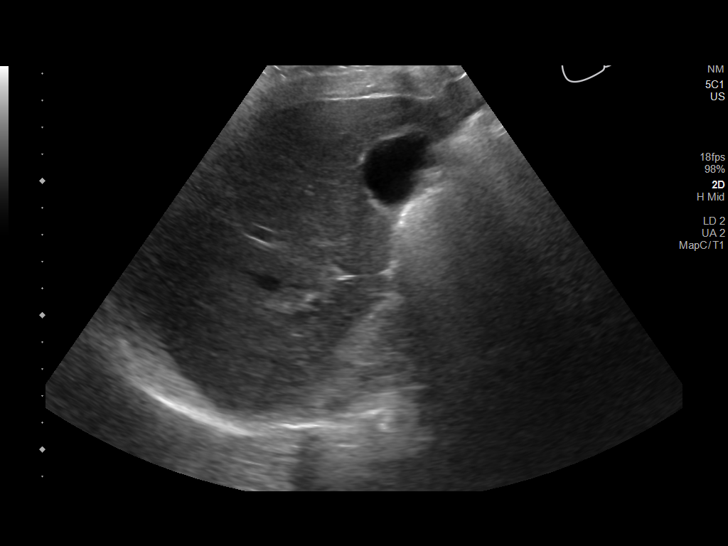
[im 36/54]
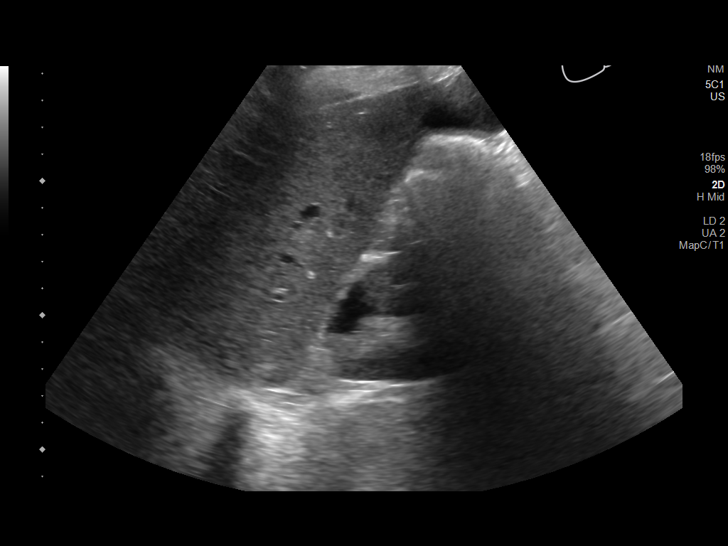
[im 40/54]
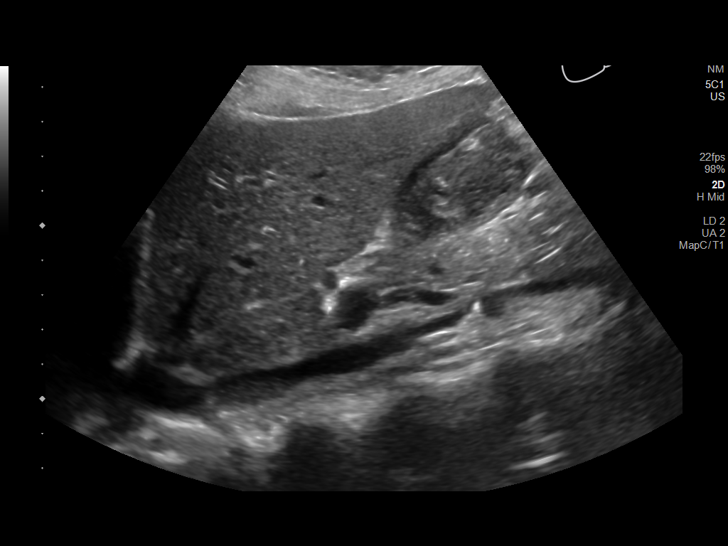
[im 45/54]
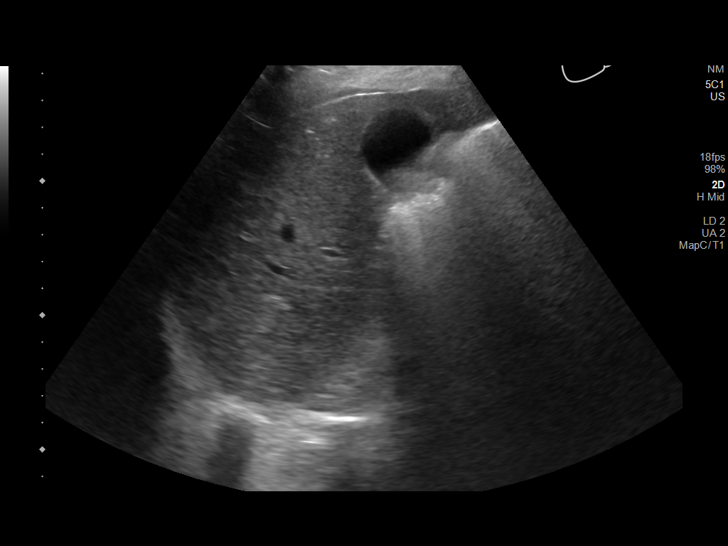
[im 49/54]
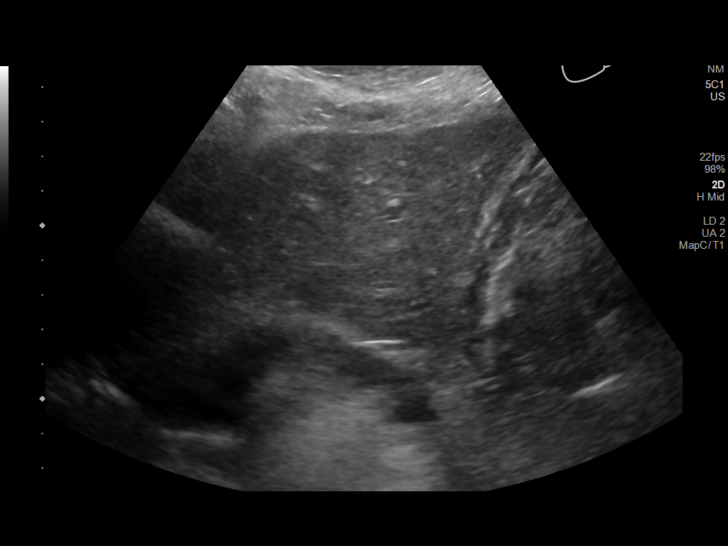
[im 54/54]
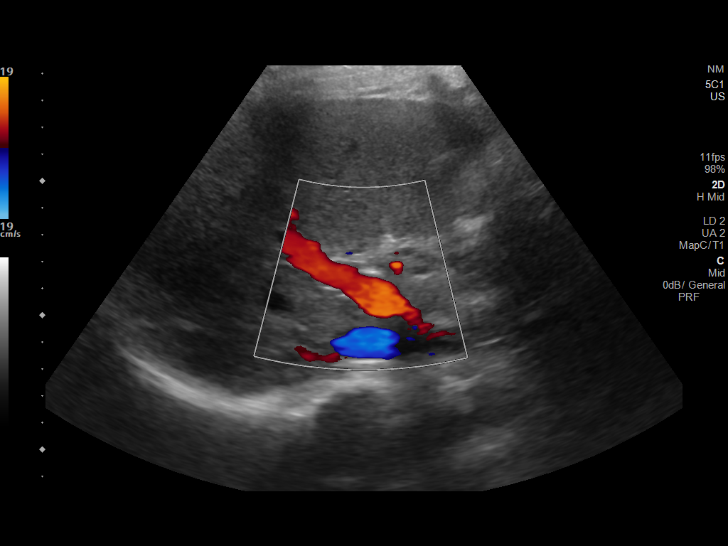

[14 of 25 positions shown; findings below may reference images not displayed]

FINDINGS: Gallbladder:

No gallstones or wall thickening visualized. No sonographic Murphy
sign noted by sonographer.

Common bile duct:

Diameter: 3 mm

Liver:

No focal lesion identified. Within normal limits in parenchymal
echogenicity. Portal vein is patent on color Doppler imaging with
normal direction of blood flow towards the liver.

Other: None.
IMPRESSION: Normal study.

## 2020-09-18 NOTE — Telephone Encounter (Signed)
Contacted patient at beginning of shift due to Patient contacted due to elevated BG on BMP collected 539.  Patient sent   Since 6:30 PM, she has had 2 units of novolog.   Patient reports taking 6 units of basaglar last night and plans to take another 6 tonight around 10-11pm.   Patient advised to increase basaglar to 8 units tonight.   Patient denies feeling any lightheadedness or nausea at this time. She reports some dizziness when putting in eye drops but states that it quickly goes away.  She reports sensation that her heart is pounding. She checked her heart rate with wrist monitor and reports rate of 89 and BP  137/72.

## 2020-09-18 NOTE — Telephone Encounter (Signed)
**  AFTER HOURS CALL**  Received After hours page from [facility] that patient had a critical reading of serum blood glucose of 539 from yesterday during her visit with Dr. Valentina Lucks at [facility]. I see they have been titrating her lantus and patient has lantus to use at home as of yesterday. Patient requires ASL interpreter and has phone messaging service. Attempted to contact using home phone with messaging service. Left VM for patient to call our after hours emergency line or to go to nearest ED. Will send MyChart message. Patient must be evaluated for DKA if she has persistently high BG.  Gladys Damme, MD [facility], PGY-2

## 2020-09-18 NOTE — Progress Notes (Signed)
Reviewed: I agree with Dr. Koval's documentation and management. 

## 2020-09-20 ENCOUNTER — Ambulatory Visit (INDEPENDENT_AMBULATORY_CARE_PROVIDER_SITE_OTHER): Payer: Medicare Other | Admitting: Pharmacist

## 2020-09-20 ENCOUNTER — Other Ambulatory Visit: Payer: Self-pay

## 2020-09-20 VITALS — Wt 136.0 lb

## 2020-09-20 DIAGNOSIS — E11311 Type 2 diabetes mellitus with unspecified diabetic retinopathy with macular edema: Secondary | ICD-10-CM

## 2020-09-20 NOTE — Patient Instructions (Signed)
Ms. Morioka it was a pleasure seeing you today.   Please do the following:  1. START Victoza 0.6mg  into the skin once daily as directed today during your appointment. If you have any questions or if you believe something has occurred because of this change, call me or your doctor to let one of [name] know.  2. Continue checking blood sugars at home. It's really important that you record these and bring these in to your next doctor's appointment.  3. Continue making the lifestyle changes we've discussed together during our visit. Diet and exercise play a significant role in improving your blood sugars.  4. Follow-up with me via mychart in one week.   Hypoglycemia or low blood sugar:   Low blood sugar can happen quickly and may become an emergency if not treated right away.   While this shouldn't happen often, it can be brought upon if you skip a meal or do not eat enough. Also, if your insulin or other diabetes medications are dosed too high, this can cause your blood sugar to go to low.   Warning signs of low blood sugar include: 1. Feeling shaky or dizzy 2. Feeling weak or tired  3. Excessive hunger 4. Feeling anxious or upset  5. Sweating even when you aren't exercising  What to do if I experience low blood sugar? Follow the Rule of 15 1. Check your blood sugar with your meter. If lower than 70, proceed to step 2.  2. Treat with 15 grams of fast acting carbs which is found in 3-4 glucose tablets. If none are available you can try hard candy, 1 tablespoon of sugar or honey,4 ounces of fruit juice, or 6 ounces of REGULAR soda.  3. Re-check your sugar in 15 minutes. If it is still below 70, do what you did in step 2 again. If your blood sugar has come back up, go ahead and eat a snack or small meal made up of complex carbs (ex. Whole grains) and protein at this time to avoid recurrence of low blood sugar.

## 2020-09-20 NOTE — Progress Notes (Signed)
Subjective:    Patient ID: Alicia [name], female    DOB: [birth year], 65 y.o.   MRN: [id number]  HPI  Patient is a 65 y.o. female who presents for diabetes management. She is in good spirits and presents with assistance of sign language intepreter. Patient was referred on 09/16/2020.  Patient was last seen by Primary Care Provider on 07/26/2020. Patient was seen on 09/15/20 and all injectable diabetes medications were discontinued per provider due to in clinic hypoglycemic event (blood glucose of 38). Patient then presented 2 days later due to complaints of HYPERglycemia. Basal and bolus insulin were re-started during this visit.   Patient reports since restarting insulin her blood glucose has been "fine." She mentioned that she spoke with Dr. Alba Cory on Saturday night and was instructed to increase her Basaglar by 2 units, from 6 to 8 units/day due to continued elevation in blood glucose.  PMH significant for HTN, HLD.   Insurance coverage/medication affordability: Medicare A/B  Current diabetes medications include:  metformin 1000mg  BID, basaglar (glargine) 8 units, Humalog (insulin lispro) 1-2 units TID with meals Current hypertension medications include: carvedilol 25 mg BID, HCTZ 12.5 mg daily, valsartan 160mg  HS Current hyperlipidemia medications include: rosuvastatin 5mg  twice a week. Patient states that she is taking her medications as prescribed. Patient reports adherence with medications. She states that she does not miss her medications.  Patient reported dietary habits:  Eats 3 meals/day and 1-2 snacks/day; Boluses with 3 meals/day Breakfast: cereal, wheat waffle, [place] bacon Lunch: sandwich or salad with grilled chicken Dinner: chicken, vegetables, brown rice Snacks: yogurt, crackers, peanut butter, fruit Drinks: orange juice, water, diet soda   Patient denies hypoglycemic events. Patient reports polyuria (increased urination).  Patient denies polyphagia (increased  appetite).  Patient denies polydipsia (increased thirst).  Patient reports neuropathy (nerve pain). Patient denies visual changes. Patient reports self foot exams.   Home fasting blood sugars: 324, 351 2 hour post-meal/random blood sugars: 337 (before lunch), 380 (before bed)  Objective:   Labs:   Lab Results  Component Value Date   HGBA1C 7.8 (A) 07/26/2020   HGBA1C 6.9 04/02/2020   HGBA1C 7.5 (A) 12/05/2019   Today's Vitals   09/20/20 1018  Weight: 136 lb (61.7 kg)   Lipid Panel     Component Value Date/Time   CHOL 176 08/07/2019 0806   TRIG 49 08/07/2019 0806   HDL 88 08/07/2019 0806   CHOLHDL 2.0 08/07/2019 0806   CHOLHDL 2.3 07/28/2016 0936   VLDL 12 07/28/2016 0936   LDLCALC 78 08/07/2019 0806   LDLDIRECT 78 07/10/2013 1441    Clinical Atherosclerotic Cardiovascular Disease (ASCVD): No  The 10-year ASCVD risk score Mikey Bussing DC Jr., et al., 2013) is: 14.2%   Values used to calculate the score:     Age: 59 years     Sex: Female     Is Non-Hispanic African American: Yes     Diabetic: Yes     Tobacco smoker: No     Systolic Blood Pressure: 101 mmHg     Is BP treated: Yes     HDL Cholesterol: 88 mg/dL     Total Cholesterol: 176 mg/dL    Assessment/Plan:   T2DM longstanding is not controlled likely due to currently adjusting regimen to achieve [name] glycemic control while avoiding hypoglycemia. Medication adherence appears optimal. Additional pharmacotherapy is warranted at this time as patient is continuing to have elevated blood glucose. Patient with a history of intolerance to Trulicity. Will re-start Victoza 0.6mg .  Patient educated on purpose, proper use and potential adverse effects of Victoza. Following instruction patient verbalized understanding of treatment plan.   1. Continued basal insulin BASAGLAR 8 units.  2. Continued  rapid insulin HUMALOG 1-2 units TID with meals.  3. Restarted GLP-1 Victoza (liraglutide) 0.6mg  once daily.  4. Extensively  discussed pathophysiology of diabetes, recommended lifestyle interventions, dietary effects on blood sugar control 5. Counseled on s/sx of and management of hypoglycemia 6. Next A1C anticipated 10/24/20.   Follow-up via mychart in one week to review sugar readings and tolerability to Victoza. Written patient instructions provided.  This appointment required 40 minutes of patient care (this includes precharting, chart review, review of results, and face-to-face care).  Thank you for involving pharmacy to assist in providing this patient's care.  Patient seen with Inis Sizer, PharmD Candidate, and Shauna Hugh, PharmD - PGY-1 Resident.   Medication Samples have been provided to the patient.  Drug name: Liraglutide (Victoza)       Strength: 18mg /80mL       Qty: 5 pens  LOT: [id number]  Exp.Date: 09/30/21  Dosing instructions: Inject 0.6mg  into the skin once daily and then will titrate based on follow-up with provider  The patient has been instructed regarding the correct time, dose, and frequency of taking this medication, including desired effects and most common side effects.   Alicia [name] 10:19 AM 09/20/2020

## 2020-09-28 ENCOUNTER — Telehealth: Payer: Self-pay | Admitting: Podiatry

## 2020-09-29 NOTE — Telephone Encounter (Signed)
error 

## 2020-09-30 ENCOUNTER — Ambulatory Visit (INDEPENDENT_AMBULATORY_CARE_PROVIDER_SITE_OTHER): Payer: Medicare Other

## 2020-09-30 ENCOUNTER — Encounter: Payer: Self-pay | Admitting: Podiatry

## 2020-09-30 ENCOUNTER — Other Ambulatory Visit: Payer: Self-pay

## 2020-09-30 ENCOUNTER — Ambulatory Visit (INDEPENDENT_AMBULATORY_CARE_PROVIDER_SITE_OTHER): Payer: Medicare Other | Admitting: Podiatry

## 2020-09-30 DIAGNOSIS — S9031XA Contusion of right foot, initial encounter: Secondary | ICD-10-CM

## 2020-09-30 NOTE — Progress Notes (Signed)
  Subjective:  Patient ID: Alicia [name], female    DOB: 07/09/1955,  MRN: [id number]  Chief Complaint  Patient presents with  . Foot Pain    Right foot pain. PT stated that on Sunday she was cleaning her house and somehow she hit her foot against the wall and her foot started to swell she has been using ice but her foot is still really sore    65 y.o. female presents with the above complaint. History confirmed with patient.  She is hearing impaired and is here with an interpreter today  Objective:  Physical Exam: warm, good capillary refill, no trophic changes or ulcerative lesions, normal DP and PT pulses and normal sensory exam.   Right Foot: She has pain on palpation along the fifth metatarsal and at the base and with resisted eversion, no gross instability or ecchymosis or evidence of fracture.  Mild edema in the area   Radiographs: X-ray of the right foot: no fracture, dislocation, swelling or degenerative changes noted Assessment:   1. Contusion of right foot, initial encounter      Plan:  Patient was evaluated and treated and all questions answered.  Discussed with her that she has a likely contusion of the foot from direct impact.  Reviewed RICE protocol.  Advised to use Tylenol for pain control, she cannot tolerate NSAIDs per her medical doctor's advice.  Gave her a compression sleeve to control edema and recommend she be WBAT in a CAM boot, she can discontinue its use in a couple weeks if it is feeling much better.  Return in about 4 weeks (around 10/28/2020), or if symptoms worsen or fail to improve.

## 2020-09-30 NOTE — Patient Instructions (Signed)
Foot Contusion A foot contusion is a deep bruise to the foot. Contusions are the result of an injury to tissues and muscle fibers under the skin. The injury causes bleeding under the skin. The skin over the contusion may turn blue, purple, or yellow. Minor injuries will cause a painless contusion, but more severe contusions may stay painful and swollen for a few weeks. What are the causes? This condition is usually caused by a hard hit or direct force to your foot, such as having a heavy object fall on your foot. What are the signs or symptoms? Symptoms of this condition include:  Swelling of the foot.  Pain and tenderness of the foot.  Discoloration of the foot. The area may have redness and then turn blue, purple, or yellow.   How is this diagnosed? This condition may be diagnosed based on:  Your medical history.  A physical exam. In some cases, imaging tests may be done to check for other injuries. These may include:  An X-ray to check for broken bones (fractures).  CT scan or MRI to check for torn or injured ligaments. How is this treated? In general, the best treatment for a foot contusion is rest, ice, pressure (compression), and elevation. This is often called RICE therapy. An elastic wrap may be recommended to support your foot. Over-the-counter anti-inflammatory medicines may also be recommended for pain control. If your swelling or pain is severe, you may be given crutches. Follow these instructions at home: RICE therapy  Rest the injured area. Try to avoid standing or walking while your foot is painful.  If directed, put ice on the injured area. ? Put ice in a plastic bag. ? Place a towel between your skin and the bag. ? Leave the ice on for 20 minutes, 2-3 times a day.  If directed, apply light compression to the injured area using an elastic wrap. Make sure the wrap is not too tight. Remove and reapply the wrap as told by your health care provider. If your toes become  numb, cold, or blue, take the wrap off and reapply it more loosely.  Raise (elevate) the injured area above the level of your heart while you are sitting or lying down.   General instructions  Take over-the-counter and prescription medicines only as told by your health care provider.  Use crutches as told by your health care provider, if this applies. Do not use the injured foot to support your body weight until your health care provider says that you can.  Do not use any products that contain nicotine or tobacco, such as cigarettes, e-cigarettes, and chewing tobacco. These can delay healing. If you need help quitting, ask your health care provider.  Keep all follow-up visits as told by your health care provider. This is important. Contact a health care provider if:  Your symptoms do not improve after several days of treatment.  You have redness, swelling, or pain in your foot or toes.  You have difficulty moving the injured area.  Your swelling or pain is not relieved with medicines. Get help right away if:  You have severe pain.  Your foot or toes become numb.  Your foot or toes become pale or cold.  You cannot move your foot or ankle.  Your foot is warm to the touch. Summary  A foot contusion is a deep bruise to the foot.  This condition is usually caused by a hard hit or direct force to your foot.  Symptoms include  swelling, pain, and discoloration in the injured area.  In general, the best treatment for a foot contusion is rest, ice, pressure (compression), and elevation. This information is not intended to replace advice given to you by your health care provider. Make sure you discuss any questions you have with your health care provider. Document Revised: 02/19/2018 Document Reviewed: 02/19/2018 Elsevier Patient Education  2021 [employer].

## 2020-10-07 DIAGNOSIS — M48062 Spinal stenosis, lumbar region with neurogenic claudication: Secondary | ICD-10-CM | POA: Diagnosis not present

## 2020-10-09 ENCOUNTER — Other Ambulatory Visit: Payer: Self-pay | Admitting: Physician Assistant

## 2020-10-11 ENCOUNTER — Ambulatory Visit (INDEPENDENT_AMBULATORY_CARE_PROVIDER_SITE_OTHER): Payer: Medicare Other | Admitting: Podiatry

## 2020-10-11 ENCOUNTER — Other Ambulatory Visit: Payer: Self-pay

## 2020-10-11 DIAGNOSIS — M778 Other enthesopathies, not elsewhere classified: Secondary | ICD-10-CM | POA: Diagnosis not present

## 2020-10-11 MED ORDER — BETAMETHASONE SOD PHOS & ACET 6 (3-3) MG/ML IJ SUSP
3.0000 mg | Freq: Once | INTRAMUSCULAR | Status: AC
  Administered 2020-10-11: 3 mg via INTRA_ARTICULAR

## 2020-10-11 NOTE — Progress Notes (Signed)
   HPI: 65 y.o. female presenting today for follow-up evaluation and treatment regarding an injury to the patient's right foot that occurred approximately 3 weeks ago when she hit her foot against a kitchen cabinet.  She was last seen in the office on 09/30/2020 at which time a cam boot was dispensed.  She did not wear the cam boot much because it was throwing her balance off.  She felt very unstable and uncomfortable in the cam boot.  She has noticed modest improvement over the past few weeks.  She presents for further treatment evaluation  Past Medical History:  Diagnosis Date  . Anemia   . ARTHRITIS, KNEE 09/17/2007  . Asthma 04/04/2010  . Cataract 01/07/2019  . Closed fracture of distal end of left radius 11/01/2015  . Complete deafness    Meningitis at age 81  . Deaf   . Diabetes mellitus   . Diabetic neuropathy ([place])   . Ganglion cyst 06/22/2011  . Gastroparesis   . GERD (gastroesophageal reflux disease)   . H/O: C-section   . Hyperlipidemia   . Hypertension   . Neuromuscular disorder ([place])    diabetic neuropathy  . PSVT (paroxysmal supraventricular tachycardia) ([place]) 11/09/2017   Event monitor 09/14/2017 - Predominantly sinus rhythm with episodes of narrow-complex tachycardia suggestive of paroxysmal supraventricular tachycardia.  . S/P appy      Physical Exam: General: The patient is alert and oriented x3 in no acute distress.  Dermatology: Skin is warm, dry and supple bilateral lower extremities. Negative for open lesions or macerations.  Vascular: Palpable pedal pulses bilaterally. No edema or erythema noted. Capillary refill within normal limits.  Neurological: Epicritic and protective threshold grossly intact bilaterally.   Musculoskeletal Exam: Range of motion within normal limits to all pedal and ankle joints bilateral. Muscle strength 5/5 in all groups bilateral.  There is pain on palpation along the tarsometatarsal joint at the base of the fourth metatarsal.    Assessment: 1.  Capsulitis right foot secondary to blunt trauma injury; approximately 3 weeks ago   Plan of Care:  1. Patient evaluated.  2.  Injection of 0.5 cc Celestone Soluspan injected into the lateral midfoot right 3.  Patient cannot tolerate oral NSAIDs 4.  Patient states that prednisone packs make her blood glucose levels spike.  No oral medications were prescribed today 5.  Patient may discontinue cam boot and compression sleeve since she finds them uncomfortable and there is no significant edema 6.  Recommend good supportive tennis shoes with OTC power step insoles that she already has 7.  Return to clinic on a scheduled appointment with Dr. Debbe Bales, DPM [facility]  Dr. Edrick Kins, DPM    [address], [place] [address]                Office [phone]  Fax [phone]

## 2020-10-14 ENCOUNTER — Other Ambulatory Visit: Payer: Self-pay

## 2020-10-14 DIAGNOSIS — E11311 Type 2 diabetes mellitus with unspecified diabetic retinopathy with macular edema: Secondary | ICD-10-CM

## 2020-10-14 MED ORDER — INSULIN LISPRO (1 UNIT DIAL) 100 UNIT/ML (KWIKPEN): 1.0000 [IU] | PEN_INJECTOR | Freq: Three times a day (TID) | SUBCUTANEOUS | 0 refills | Status: DC

## 2020-10-19 ENCOUNTER — Other Ambulatory Visit: Payer: Self-pay | Admitting: Physician Assistant

## 2020-10-24 NOTE — Progress Notes (Signed)
Subjective:   Patient ID: Alicia [name]    DOB: May 28, 1956, 65 y.o. female   MRN: [id number]  Alicia [name] is a 65 y.o. female with a history of HTN, Paroxysmal SVT, OSA, chronic idiopathic constipation, GERD, DM with neuropathy and retinopathy, hyperthyroidism, T2DM, hearing loss, osteoarthritis, chest pain, abdominal wall hernia, HLD, iron deficiency anemia, muscle pain, seasonal allergies, seizure, spinal stenosis of L4-5 here for med refill  Acute Concerns: Posterior Leg pain: tight pulling in the posterior lower extremities at certain times with certain movements, localized to the hamstring muscles. Goes away on its own. Seems to be worse in morning. Denies leg swelling.  Notes that it feels like a shooting pain that goes from buttocks down leg. This occurs on both sides. She notes she has chronic back pain. She sees ortho and they wanted to treat with steroid injection and   Diabetes: Last three A1C's below. Currently on Victoza 1.2mg  QD, Metformin 1000mg  BID, Basaglar 10U qHS, Humalog 2-4U with meals. Endorses compliance. Notes CBGs range 80-200. Fasting this morning was 110 One reading of 400 after forgetting to take injection. One episode of hypoglycemia <60 when forgetting to eat. Denies any polyuria, polydipsia, polyphagia. Due for diabetic eye exam.   Lab Results  Component Value Date   HGBA1C 8.7 (A) 10/27/2020   HGBA1C 7.8 (A) 07/26/2020   HGBA1C 6.9 04/02/2020    HTN:  BP: 118/60 today. Currently on Carvedilol 25mg  BID, HCTZ 12.5mg  QD, Valsartan 160mg  HS, Diltiazem 240mg  QD. Endorses compliance. Non-smoker. Denies any chest pain, SOB, vision changes, or headaches.  Losartan??  Lab Results  Component Value Date   CREATININE 0.86 09/17/2020   CREATININE 0.85 06/01/2020   CREATININE 0.87 07/09/2019     HLD: Last lipid panel below. Currently on Rosuvastatin 5mg  2x/week.. Endorses compliance. Denies any muscles aches or weakness. The 10-year ASCVD risk score Mikey Bussing DC  Jr., et al., 2013) is: 12.6%   Lab Results  Component Value Date   CHOL 176 08/07/2019   HDL 88 08/07/2019   LDLCALC 78 08/07/2019   LDLDIRECT 78 07/10/2013   TRIG 49 08/07/2019   CHOLHDL 2.0 08/07/2019    Review of Systems:  Per HPI.   Objective:   BP 118/60   Pulse 72   Wt 141 lb 12.8 oz (64.3 kg)   SpO2 98%   BMI 24.34 kg/m  Vitals and nursing note reviewed.  General: pleasant older woman, sitting comfortably in exam chair, well nourished, well developed, in no acute distress with non-toxic appearance CV: regular rate and rhythm without murmurs, rubs, or gallops, no lower extremity edema, 2+ radial and pedal pulses bilaterally Lungs: clear to auscultation bilaterally with normal work of breathing on room air Skin: warm, dry Extremities: warm and well perfused, normal tone MSK: negative straight leg raise bilaterally, pain to piriformis muscle bilaterally, pain to greater trochanter bilaterally but L>R, no lumbar spinal bony tenderness to palpation, gait normal Neuro: Alert and oriented, speech normal  Prior Imaging: Lumbar MRI: IMPRESSION: 1. L3-L4 severe spinal canal stenosis with moderate right and mild left neural foraminal stenosis. 2. L2-L3 moderate spinal canal stenosis and mild left neural foraminal stenosis. 3. L4-L5 mild spinal canal stenosis.  Assessment & Plan:   Hypertension Chronic, well controlled. Continue Carvedilol, HCTZ, Valsartan, Diltaizem as prescribed Last BMP in March 2022 stable kidney function  Type 2 diabetes mellitus with ophthalmic complication (HCC) Chronic. Worsening A1C Increase Victoza to 1.8mg  QD Continue Metformin 1000mg  BID Fasting glucose appropriate thus will  not adjust Basaglar. Continue 10U daily Continue Humalog 2-4 units with meals Follow up with Dr. Valentina Lucks in 1 month Consider addition of SGLT-2 if further glucose lowering is needed Due for diabetic eye exam. Recommended  HYPERCHOLESTEROLEMIA Chronic. On  intermittent statin use due to history of intolerance.  - continue Rosuvastatin 2x/week - lipid panel today to monitor - can consider zetia for additional lipid lowering   Iron deficiency anemia Chronic. Has been on iron supplement for some time. Normal colonoscopy per chart review but no records in chart.  Recommend follow up with PCP for further evaluation, repeat labs, ferritin level. Consider celiac study.   Greater trochanteric bursitis of both hips Exam consistent with L>R greater trochanteric bursitis. Patient hesitant to try steroid injection due to diabetes and hyperglycemia with steroid injections in the past.  Naproxen 500mg  BID x 14 days.  Heating pad Follow up if no improvement  Piriformis syndrome of both sides Acute. History and physical exam appear most consistent with piriformis syndrome with tight hamstrings, although SLR was negative today. Recommended piriformis and hamstring stretches, heat, and Naproxen 500mg  BID.  Follow up if no improvement.   Orders Placed This Encounter  Procedures  . Lipid Panel  . POCT glycosylated hemoglobin (Hb A1C)   Meds ordered this encounter  Medications  . insulin lispro (HUMALOG KWIKPEN) 100 UNIT/ML KwikPen    Sig: Inject 2-4 Units into the skin 3 (three) times daily before meals.    Dispense:  3 mL    Refill:  0    Order Specific Question:   Lot Number?    Answer:   [id number]    Order Specific Question:   Expiration Date?    Answer:   03/02/2022    Order Specific Question:   Quantity    Answer:   1    Comments:   Pen  . liraglutide (VICTOZA) 18 MG/3ML SOPN    Sig: Inject 1.8 mg into the skin 1 day or 1 dose for 120 doses.    Dispense:  27 mL    Refill:  1  . Insulin Pen Needle 31G X 5 MM MISC    Sig: 1 Container by Does not apply route once as needed for up to 1 dose.    Dispense:  100 each    Refill:  11    Dispense QS for 4 times daily injection.  . Insulin Glargine (BASAGLAR KWIKPEN) 100 UNIT/ML    Sig: Inject 10  Units into the skin daily.    Dispense:  3 mL    Refill:  4    Type 2 Diabetes E11  . naproxen (NAPROSYN) 500 MG tablet    Sig: Take 1 tablet (500 mg total) by mouth 2 (two) times daily with a meal for 14 days.    Dispense:  28 tablet    Refill:  0   Due to language barrier, an interpreter was present during the history-taking and subsequent discussion (and for part of the physical exam) with this patient. ALS Interpreter    Mina Marble, DO PGY-3, [facility] 10/27/2020 6:52 PM

## 2020-10-27 ENCOUNTER — Ambulatory Visit (INDEPENDENT_AMBULATORY_CARE_PROVIDER_SITE_OTHER): Payer: Medicare Other | Admitting: Family Medicine

## 2020-10-27 ENCOUNTER — Other Ambulatory Visit: Payer: Self-pay

## 2020-10-27 ENCOUNTER — Encounter: Payer: Self-pay | Admitting: Family Medicine

## 2020-10-27 ENCOUNTER — Other Ambulatory Visit: Payer: Self-pay | Admitting: Family Medicine

## 2020-10-27 VITALS — BP 118/60 | HR 72 | Wt 141.8 lb

## 2020-10-27 DIAGNOSIS — E119 Type 2 diabetes mellitus without complications: Secondary | ICD-10-CM | POA: Diagnosis not present

## 2020-10-27 DIAGNOSIS — E7800 Pure hypercholesterolemia, unspecified: Secondary | ICD-10-CM

## 2020-10-27 DIAGNOSIS — Z794 Long term (current) use of insulin: Secondary | ICD-10-CM

## 2020-10-27 DIAGNOSIS — D509 Iron deficiency anemia, unspecified: Secondary | ICD-10-CM | POA: Diagnosis not present

## 2020-10-27 DIAGNOSIS — G5703 Lesion of sciatic nerve, bilateral lower limbs: Secondary | ICD-10-CM | POA: Diagnosis not present

## 2020-10-27 DIAGNOSIS — M5431 Sciatica, right side: Secondary | ICD-10-CM | POA: Insufficient documentation

## 2020-10-27 DIAGNOSIS — M7062 Trochanteric bursitis, left hip: Secondary | ICD-10-CM | POA: Insufficient documentation

## 2020-10-27 DIAGNOSIS — I10 Essential (primary) hypertension: Secondary | ICD-10-CM | POA: Diagnosis not present

## 2020-10-27 DIAGNOSIS — M5432 Sciatica, left side: Secondary | ICD-10-CM | POA: Insufficient documentation

## 2020-10-27 DIAGNOSIS — M7061 Trochanteric bursitis, right hip: Secondary | ICD-10-CM | POA: Diagnosis not present

## 2020-10-27 DIAGNOSIS — E11311 Type 2 diabetes mellitus with unspecified diabetic retinopathy with macular edema: Secondary | ICD-10-CM

## 2020-10-27 LAB — POCT GLYCOSYLATED HEMOGLOBIN (HGB A1C): HbA1c, POC (controlled diabetic range): 8.7 % — AB (ref 0.0–7.0)

## 2020-10-27 MED ORDER — INSULIN LISPRO (1 UNIT DIAL) 100 UNIT/ML (KWIKPEN): 2.0000 [IU] | PEN_INJECTOR | Freq: Three times a day (TID) | SUBCUTANEOUS | 0 refills | Status: DC

## 2020-10-27 MED ORDER — BASAGLAR KWIKPEN 100 UNIT/ML ~~LOC~~ SOPN: 10.0000 [IU] | PEN_INJECTOR | Freq: Every day | SUBCUTANEOUS | 4 refills | Status: DC

## 2020-10-27 MED ORDER — INSULIN PEN NEEDLE 31G X 5 MM MISC: 1.0000 | Freq: Once | 11 refills | Status: DC | PRN

## 2020-10-27 MED ORDER — NAPROXEN 500 MG PO TABS: 500.0000 mg | ORAL_TABLET | Freq: Two times a day (BID) | ORAL | 0 refills | Status: AC

## 2020-10-27 MED ORDER — VICTOZA 18 MG/3ML ~~LOC~~ SOPN: 1.8000 mg | PEN_INJECTOR | SUBCUTANEOUS | 1 refills | Status: DC

## 2020-10-27 NOTE — Patient Instructions (Signed)
It was a pleasure to see you today!  Thank you for choosing [facility] for your primary care.   Our plans for today were:  Diabetes: increase Vitcoza to 1.8mg  daily. Continue Humalog 2 to 4 units with meals.  Continue Basaglar 10 units daily.  Continue metformin 1000 mg twice a day.  Follow-up with Dr. Valentina Lucks on 11/26/2020  Continue all of your blood pressure and cholesterol medication medications  Please follow-up with your PCP to discuss your iron deficiency  For your leg pain please take naproxen 500 mg twice a day for 14 days.  I recommend daily stretches and heating pad  To keep you healthy, please keep in mind the following health maintenance items that you are due for:   1. You are due for your diabetic eye exam   We are checking some labs today, I will call you if they are abnormal will send you a MyChart message or a letter if they are normal.  If you do not hear about your labs in the next 2 weeks please let us know.  BRING ALL OF YOUR MEDICATIONS WITH YOU TO EVERY VISIT   You should return to our clinic in 3 months for Diabetes follow up.   Best Wishes,   Mina Marble, DO

## 2020-10-27 NOTE — Assessment & Plan Note (Signed)
Chronic, well controlled. Continue Carvedilol, HCTZ, Valsartan, Diltaizem as prescribed Last BMP in March 2022 stable kidney function

## 2020-10-27 NOTE — Assessment & Plan Note (Signed)
Exam consistent with L>R greater trochanteric bursitis. Patient hesitant to try steroid injection due to diabetes and hyperglycemia with steroid injections in the past.  Naproxen 500mg  BID x 14 days.  Heating pad Follow up if no improvement

## 2020-10-27 NOTE — Assessment & Plan Note (Signed)
Acute. History and physical exam appear most consistent with piriformis syndrome with tight hamstrings, although SLR was negative today. Recommended piriformis and hamstring stretches, heat, and Naproxen 500mg  BID.  Follow up if no improvement.

## 2020-10-27 NOTE — Assessment & Plan Note (Addendum)
Chronic. Worsening A1C Increase Victoza to 1.8mg  QD Continue Metformin 1000mg  BID Fasting glucose appropriate thus will not adjust Basaglar. Continue 10U daily Continue Humalog 2-4 units with meals Follow up with Dr. Valentina Lucks in 1 month Consider addition of SGLT-2 if further glucose lowering is needed Due for diabetic eye exam. Recommended

## 2020-10-27 NOTE — Assessment & Plan Note (Addendum)
Chronic. On intermittent statin use due to history of intolerance.  - continue Rosuvastatin 2x/week - lipid panel today to monitor - can consider zetia for additional lipid lowering

## 2020-10-27 NOTE — Assessment & Plan Note (Signed)
Chronic. Has been on iron supplement for some time. Normal colonoscopy per chart review but no records in chart.  Recommend follow up with PCP for further evaluation, repeat labs, ferritin level. Consider celiac study.

## 2020-10-28 LAB — LIPID PANEL
Chol/HDL Ratio: 2.3 ratio (ref 0.0–4.4)
Cholesterol, Total: 200 mg/dL — ABNORMAL HIGH (ref 100–199)
HDL: 88 mg/dL (ref >39)
LDL Chol Calc (NIH): 104 mg/dL — ABNORMAL HIGH (ref 0–99)
Triglycerides: 40 mg/dL (ref 0–149)
VLDL Cholesterol Cal: 8 mg/dL (ref 5–40)

## 2020-10-29 ENCOUNTER — Telehealth: Payer: Self-pay

## 2020-10-29 MED ORDER — INSULIN ASPART 100 UNIT/ML FLEXPEN: 2.0000 [IU] | PEN_INJECTOR | Freq: Three times a day (TID) | SUBCUTANEOUS | 5 refills | Status: DC

## 2020-10-29 NOTE — Telephone Encounter (Signed)
Patient calls nurse line stating her insurance no longer covers Humalog. I called her pharmacy and Novolog is now preferred. Please advise on change and send in if appropriate.

## 2020-10-29 NOTE — Telephone Encounter (Signed)
Updated prescription for novolog 2-3 units with meals from humalog with meals.

## 2020-11-01 ENCOUNTER — Telehealth: Payer: Self-pay

## 2020-11-01 NOTE — Telephone Encounter (Signed)
Patient calls nurse line stating her leg/hip pain is not improving. Patient reports she has been taking Naproxen as prescribed with little relief. Patient is requesting a steroid injection now, she previously declined. Apt made for 5/9 with Dr. Tarry Kos.

## 2020-11-03 ENCOUNTER — Other Ambulatory Visit: Payer: Self-pay | Admitting: Physician Assistant

## 2020-11-04 NOTE — Progress Notes (Signed)
Cardiology Office Note Date:  11/04/2020  Patient ID:  Alicia [name], Alicia [name] [birth year], MRN [id number] PCP:  Eulis Foster, MD  Cardiologist:  Dr. Saunders Revel Electrophysiologist: Dr. Curt Bears   Today's visit is held today using sign language interpretor Sarina Ser w/[facility] services    Chief Complaint: annual visit  History of Present Illness: Alicia [name] is a 65 y.o. female with history of HTN, HLD, SVT, DM, deafness, OSA (she denies this as an ongoing diagnosis for her, has never been told she needed a CPAP), CP felt to be atypical with neg stress testing  She comes in today to be seen for Dr. Curt Bears.  Referred to him Nov 2020 for palpitations and SVT noted on a monitor.  At that time was still waiting to receive CPAP for her sleep apnea.  She was still having palpitations despite coreg.  Discussed ablation though the pt preferred ongoing medical management first and daily dilt was added.  She had an ER visit in Nov 2020 with CP, nausea, felt to be GI ultimately and her prilosec increased. HS Trop 3 > 4  I saw her Feb 2021 She feels well from a cardiac perspective, no CP, palpitations.  She is limited by b/l knee and back pain, and has been struggling with GI issues.   She has days that she gets so bloated , is very uncomfortable, her clothes are tight, associated with nausea and sometimes vomiting as well.  Mostly triggers by eating, and can be improved with Mylanta, sometimes a laxative will help with BM. She was to have been referred to GI via her PMD office though has been a few weeks and not hear back yet.  She is due to see her again in a week or two.  Says at their last visit her knees were hurting her so bad that they concentrated on that. No palpitations, tachycardias, no dizzy spells, no near syncope or syncope. She has not yet been able to get around to following up on her CPAP machine.  Says she just has som much to take care of, work, home, so on. No changes were  made Encouraged to f/u on GI referral  TODAY She is struggling with her DM especially Has had many changes in her meds in the last 3 months and starting to get back on track Follows this with her PMD and their pharmacists S he has had bloating but thinks that is her Linzess. Infrequent palpitations and rarely uses the PRN diltiazem She will sometimes feel like her HR is fast though this usually coorelates with very high or low sugars  No CP, no near syncope or syncope She does not have any cardiac concerns today    Past Medical History:  Diagnosis Date  . Anemia   . ARTHRITIS, KNEE 09/17/2007  . Asthma 04/04/2010  . Cataract 01/07/2019  . Closed fracture of distal end of left radius 11/01/2015  . Complete deafness    Meningitis at age 39  . Deaf   . Diabetes mellitus   . Diabetic neuropathy ([place])   . Ganglion cyst 06/22/2011  . Gastroparesis   . GERD (gastroesophageal reflux disease)   . H/O: C-section   . Hyperlipidemia   . Hypertension   . Neuromuscular disorder ([place])    diabetic neuropathy  . PSVT (paroxysmal supraventricular tachycardia) ([place]) 11/09/2017   Event monitor 09/14/2017 - Predominantly sinus rhythm with episodes of narrow-complex tachycardia suggestive of paroxysmal supraventricular tachycardia.  . S/P appy  Past Surgical History:  Procedure Laterality Date  . APPENDECTOMY    . cardiolyte EF 77%, no ischemia in 2006  2006  . CARPAL TUNNEL RELEASE Right 04/20/2015   Procedure: RIGHT CARPAL TUNNEL RELEASE;  Surgeon: Daryll Brod, MD;  Location: [place];  Service: Orthopedics;  Laterality: Right;  . CARPAL TUNNEL RELEASE Left 11/04/2015   Procedure: CARPAL TUNNEL RELEASE;  Surgeon: Daryll Brod, MD;  Location: [place];  Service: Orthopedics;  Laterality: Left;  . CESAREAN SECTION    . OPEN REDUCTION INTERNAL FIXATION (ORIF) DISTAL RADIAL FRACTURE Left 11/04/2015   Procedure: OPEN REDUCTION INTERNAL FIXATION (ORIF) LEFT DISTAL RADIAL  FRACTURE POSSIBLE BONE GRAFT;  Surgeon: Daryll Brod, MD;  Location: [place];  Service: Orthopedics;  Laterality: Left;  . TRIGGER FINGER RELEASE Right 04/20/2015   Procedure: RELEASE TRIGGER FINGER/A-1 PULLEY RIGHT MIDDLE FINGER,RIGHT RING FINGER;  Surgeon: Daryll Brod, MD;  Location: [place];  Service: Orthopedics;  Laterality: Right;  . TUBAL LIGATION    . ULNAR NERVE TRANSPOSITION Right 04/20/2015   Procedure: RIGHT ULNAR NERVE DECOMPRESSION;  Surgeon: Daryll Brod, MD;  Location: [place];  Service: Orthopedics;  Laterality: Right;  . UTERINE FIBROID EMBOLIZATION  2007  . WRIST SURGERY     Cyst removed on left  . WRIST SURGERY Right 1985   tendon repair R wrist    Current Outpatient Medications  Medication Sig Dispense Refill  . ACCU-CHEK AVIVA PLUS test strip CHECK SUGAR 3 TIMES A DAILY. 100 strip 12  . ACCU-CHEK SOFTCLIX LANCETS lancets TEST 4 TIMES A DAY 200 each 0  . aspirin 81 MG chewable tablet Chew 81 mg by mouth daily.    . carvedilol (COREG) 25 MG tablet TAKE 1 TABLET BY MOUTH TWICE A DAY (NEEDS APPOINTMENT FOR ANY FUTURE REFILLS) 2nd attempt. Please keep upcoming appointment for future refills. 3rd attempt. Thank you 30 tablet 0  . cholecalciferol (VITAMIN D) 1000 UNITS tablet Take 1,000 Units by mouth daily.    . diclofenac Sodium (VOLTAREN) 1 % GEL Apply 2 g topically 4 (four) times daily. Rub into affected area of foot 2 to 4 times daily 100 g 2  . diltiazem (CARDIZEM CD) 240 MG 24 hr capsule TAKE 1 CAPSULE BY MOUTH EVERY DAY 90 capsule 1  . diltiazem (CARDIZEM) 30 MG tablet TAKE 1 TABLET BY MOUTH AS NEEDED (HEART RACING OR PALPITATIONS THAT LAST LONGER THAN 15 MINS). 90 tablet 3  . ferrous sulfate 325 (65 FE) MG tablet Take 325 mg by mouth daily with breakfast.    . fluticasone (FLONASE) 50 MCG/ACT nasal spray SPRAY 2 SPRAYS INTO EACH NOSTRIL EVERY DAY 16 g 3  . gabapentin (NEURONTIN) 800 MG tablet TAKE 1 TABLET BY MOUTH  TWICE A DAY AS NEEDED 60 tablet 2  . hydrochlorothiazide (HYDRODIURIL) 12.5 MG tablet TAKE 1 TABLET BY MOUTH EVERY DAY 90 tablet 3  . insulin aspart (NOVOLOG) 100 UNIT/ML FlexPen Inject 2-4 Units into the skin 3 (three) times daily with meals. 3 mL 5  . Insulin Glargine (BASAGLAR KWIKPEN) 100 UNIT/ML Inject 10 Units into the skin daily. 3 mL 4  . Insulin Pen Needle 31G X 5 MM MISC 1 Container by Does not apply route once as needed for up to 1 dose. 100 each 11  . LINZESS 72 MCG capsule Take 72 mcg by mouth every morning.    . liraglutide (VICTOZA) 18 MG/3ML SOPN Inject 1.8 mg into the skin 1 day  or 1 dose for 120 doses. 27 mL 1  . losartan (COZAAR) 100 MG tablet Take by mouth.    . metFORMIN (GLUCOPHAGE) 1000 MG tablet TAKE 1 TABLET BY MOUTH TWICE A DAY 180 tablet 1  . Misc Natural Products (GLUCOSAMINE CHOND MSM FORMULA) TABS Take by mouth daily.    . naproxen (NAPROSYN) 500 MG tablet Take 1 tablet (500 mg total) by mouth 2 (two) times daily with a meal for 14 days. 28 tablet 0  . Olopatadine HCl 0.2 % SOLN PLACE 1 DROP IN BOTH EYES DAILY    . pantoprazole (PROTONIX) 40 MG tablet Take 40 mg by mouth every morning.    . rosuvastatin (CRESTOR) 5 MG tablet TAKE 1 TABLET BY MOUTH 2 (TWO) TIMES A WEEK. 26 tablet 3  . Sennosides-Docusate Sodium 8.6-50 MG CAPS Take by mouth.    . valsartan (DIOVAN) 160 MG tablet Take 1 tablet (160 mg total) by mouth at bedtime. 90 tablet 0   Current Facility-Administered Medications  Medication Dose Route Frequency Provider Last Rate Last Admin  . fexofenadine (ALLEGRA) tablet 60 mg  60 mg Oral BID Sandre Kitty, MD        Allergies:   Sulfonamide derivatives, Lipitor [atorvastatin calcium], Ramipril, and Trulicity [dulaglutide]   Social History:  The patient  reports that she has never smoked. She has never used smokeless tobacco. She reports current alcohol use of about 3.0 standard drinks of alcohol per week. She reports that she does not use drugs.   Family  History:  The patient's family history includes Cancer in her maternal aunt and maternal grandmother; Diabetes in her father; Heart attack (age of onset: 29) in her mother; Hypertension in her mother.  ROS:  Please see the history of present illness.  All other systems are reviewed and otherwise negative.   PHYSICAL EXAM:  VS:  There were no vitals taken for this visit. BMI: There is no height or weight on file to calculate BMI. Well nourished, well developed, in no acute distress  HEENT: normocephalic, atraumatic  Neck: no JVD, carotid bruits or masses Cardiac: RRR; no significant murmurs, no rubs, or gallops Lungs:  CTA b/l, no wheezing, rhonchi or rales  Abd: soft, nontender, obese MS: no deformity or atrophy Ext: no edema  Skin: warm and dry, no rash Neuro:  No gross deficits appreciated Psych: euthymic mood, full affect    EKG:  Done today and reviewed by myself SR 95bpm, no changes   Lexiscan Myoview 09/14/2017:  Nuclear stress EF: 61%.  The study is normal.  The left ventricular ejection fraction is normal (55-65%).  1. EF 61%, normal wall motion.  2. No evidence for ischemia or infarction by perfusion images.   Normal study.  _______________  Cardiac Monitor  09/14/2017 to 09/27/2017:  The patient was monitored for 14 days.  The predominant rhythm was sinus; average heart rate during triggered events was 94 bpm.  Two episodes of narrow complex tachycardia were observed, suggestive of SVT.  No ventricular arrhythmias or prolonged pauses were seen.  Patient triggered events corresponding to rapid heart rate/palpitations/flutters correspond to sinus rhythm and narrow-complex tachycardia.  Predominantly sinus rhythm with episodes of narrow-complex tachycardia suggestive of paroxysmal supraventricular tachycardia.   10/14/2002: TTE SUMMARY  - Overall left ventricular systolic function was normal. Left     ventricular ejection fraction was estimated ,  range being 55     % to 65 %. There were no left ventricular regional wall  motion abnormalities.  - Mean transmitral gradient was 3.1 mmHg. Mitral valve area by     pressure half-time was 3.57 cm^2.  - Left atrial size was at the upper limits of normal.  IMPRESSIONS  - No likely source of an audible heart murmur was apparent.   Recent Labs: 06/01/2020: Hemoglobin 9.7; Platelets 383 09/17/2020: BUN 20; Creatinine, Ser 0.86; Potassium 4.6; Sodium 136  10/27/2020: Chol/HDL Ratio 2.3; Cholesterol, Total 200; HDL 88; LDL Chol Calc (NIH) 104; Triglycerides 40   CrCl cannot be calculated (Patient's most recent lab result is older than the maximum 21 days allowed.).   Wt Readings from Last 3 Encounters:  10/27/20 141 lb 12.8 oz (64.3 kg)  09/20/20 136 lb (61.7 kg)  09/17/20 137 lb 9.6 oz (62.4 kg)     Other studies reviewed: Additional studies/records reviewed today include: summarized above  ASSESSMENT AND PLAN:  1. SVT     No change in her palpitations behavior     Some fast HRs associated with high/low blood sugars, not her SVT  2. HTN     Looks OK, no changes today  She has both valsartan and losartan on her list, she does not think she is taking valsartan. Will take that off her list, she will have family give a call next week to confirm, she has to go to work after this visit and won't be home until after 5   Disposition: we will continue annual visits, sooner if needed   Current medicines are reviewed at length with the patient today.  The patient did not have any concerns regarding medicines.  Venetia Night, PA-C 11/04/2020 6:59 AM     [facility] [address] [place] [address] [phone] (office)  [phone] (fax)

## 2020-11-05 ENCOUNTER — Other Ambulatory Visit: Payer: Self-pay

## 2020-11-05 ENCOUNTER — Ambulatory Visit (INDEPENDENT_AMBULATORY_CARE_PROVIDER_SITE_OTHER): Payer: Medicare Other | Admitting: Physician Assistant

## 2020-11-05 ENCOUNTER — Telehealth: Payer: Self-pay | Admitting: Physician Assistant

## 2020-11-05 ENCOUNTER — Encounter: Payer: Self-pay | Admitting: Physician Assistant

## 2020-11-05 VITALS — BP 124/62 | HR 95 | Ht 64.0 in | Wt 139.0 lb

## 2020-11-05 DIAGNOSIS — I10 Essential (primary) hypertension: Secondary | ICD-10-CM

## 2020-11-05 DIAGNOSIS — I471 Supraventricular tachycardia: Secondary | ICD-10-CM

## 2020-11-05 NOTE — Telephone Encounter (Signed)
Pt c/o medication issue:  1. Name of Medication: valsartan  2. How are you currently taking this medication (dosage and times per day)? Has not taken it  3. Are you having a reaction (difficulty breathing--STAT)? no  4. What is your medication issue? Patient states Renee wanted to know if she had taken the medication and if so to stop it. She states she has not taken the medication.

## 2020-11-05 NOTE — Patient Instructions (Signed)

## 2020-11-05 NOTE — Telephone Encounter (Signed)
Lvm with communication system that message was received to continue to not take Valsartan and will make sure its removed off her medication list in our system.

## 2020-11-06 NOTE — Progress Notes (Deleted)
   Subjective:   Patient ID: Alicia [name]    DOB: 1956/03/30, 65 y.o. female   MRN: [id number]  Alicia [name] is a 65 y.o. female with a history of hypertension, paroxysmal SVT, OSA, chronic idiopathic constipation, GERD, diabetes with polyneuropathy and retinopathy, hypothyroidism, hearing loss, hyperlipidemia, iron deficiency anemia, abdominal wall hernia, seasonal allergies, history of seizure, spinal stenosis L4/5, statin intolerance here for steroid injection  HPI: Patient was seen on 4/27 and noted to have left greater than right greater trochanteric bursitis.  She was hesitant to obtain steroid injection due to her diabetes and history of hyperglycemia with steroid injections in the past.  She has been treating with naproxen 500 mg twice daily and heating pad.  She notes that she is now interested in steroid injection today.  Review of Systems:  Per HPI.   Objective:   There were no vitals taken for this visit. Vitals and nursing note reviewed.  General: pleasant ***, sitting comfortably in exam chair, well nourished, well developed, in no acute distress with non-toxic appearance HEENT: normocephalic, atraumatic, moist mucous membranes, oropharynx clear without erythema or exudate, TM normal bilaterally  Neck: supple, non-tender without lymphadenopathy CV: regular rate and rhythm without murmurs, rubs, or gallops, no lower extremity edema, 2+ radial and pedal pulses bilaterally Lungs: clear to auscultation bilaterally with normal work of breathing on room air Resp: breathing comfortably on room air, speaking in full sentences Abdomen: soft, non-tender, non-distended, no masses or organomegaly palpable, normoactive bowel sounds Skin: warm, dry, no rashes or lesions Extremities: warm and well perfused, normal tone MSK: ROM grossly intact, strength intact, gait normal Neuro: Alert and oriented, speech normal  PROCEDURE NOTE: Preop diagnosis: Left hip greater trochanter bursitis   Postop diagnosis: Left hip greater trochanter bursitis  Procedure performed: Left hip greater trochanter bursa corticosteroid injection Complications:  none EBL: < 0.56mL Specimens Removed: none Performed by: Dr. Mina Marble, DO Procedure note: Consent obtained and verified. Time-out conducted. Noted no overlying erythema, induration, or other signs of local infection. The left lateral Hip was identified and the overlying skin was prepped in a sterile fashion. Topical analgesic spray: Ethyl chloride. Needle: 25G 1 1/2  Completed without difficulty. Meds: 52mL of 40mg  Depomeddrol and 72mL of 1% Lidocaine  Advised to call if fevers/chills, erythema, induration, drainage, or persistent bleeding.  Assessment & Plan:   No problem-specific Assessment & Plan notes found for this encounter.  No orders of the defined types were placed in this encounter.  No orders of the defined types were placed in this encounter.   {    This will disappear when note is signed, click to select method of visit    :1}  Mina Marble, DO PGY-3, [facility] 11/06/2020 8:36 PM

## 2020-11-07 ENCOUNTER — Other Ambulatory Visit: Payer: Self-pay | Admitting: Family Medicine

## 2020-11-08 ENCOUNTER — Ambulatory Visit: Payer: Medicare Other | Admitting: Family Medicine

## 2020-11-10 DIAGNOSIS — K589 Irritable bowel syndrome without diarrhea: Secondary | ICD-10-CM | POA: Diagnosis not present

## 2020-11-10 DIAGNOSIS — K5900 Constipation, unspecified: Secondary | ICD-10-CM | POA: Diagnosis not present

## 2020-11-10 DIAGNOSIS — R140 Abdominal distension (gaseous): Secondary | ICD-10-CM | POA: Diagnosis not present

## 2020-11-10 DIAGNOSIS — K219 Gastro-esophageal reflux disease without esophagitis: Secondary | ICD-10-CM | POA: Diagnosis not present

## 2020-11-10 DIAGNOSIS — K2970 Gastritis, unspecified, without bleeding: Secondary | ICD-10-CM | POA: Diagnosis not present

## 2020-11-11 ENCOUNTER — Telehealth: Payer: Self-pay

## 2020-11-11 NOTE — Telephone Encounter (Signed)
Patient LVM on nurse line requesting a call back to discuss her symptoms. Patient called with a sign language interrupter and message was left to return my call.

## 2020-11-12 ENCOUNTER — Other Ambulatory Visit: Payer: Self-pay | Admitting: Physician Assistant

## 2020-11-12 ENCOUNTER — Ambulatory Visit (INDEPENDENT_AMBULATORY_CARE_PROVIDER_SITE_OTHER): Payer: Medicare Other | Admitting: Family Medicine

## 2020-11-12 ENCOUNTER — Other Ambulatory Visit: Payer: Self-pay

## 2020-11-12 DIAGNOSIS — J019 Acute sinusitis, unspecified: Secondary | ICD-10-CM | POA: Insufficient documentation

## 2020-11-12 DIAGNOSIS — B9789 Other viral agents as the cause of diseases classified elsewhere: Secondary | ICD-10-CM

## 2020-11-12 MED ORDER — FLUTICASONE PROPIONATE 50 MCG/ACT NA SUSP: NASAL | 3 refills | Status: DC

## 2020-11-12 NOTE — Telephone Encounter (Signed)
Patient returns call to nurse line. Patient is reporting chills, sore throat, body aches, and fatigue. Denies any known sick exposures. Patient is fully vaccinated with booster.   Patient reports taking home COVID test two days ago. Patient reports negative home results.  Reports symptom onset Tuesday 5/10. Patient requesting appointment for today. Unfortunately, we do not have any availability until Monday. Patient states that she will go to [facility] for further evaluation.   Talbot Grumbling, RN

## 2020-11-12 NOTE — Progress Notes (Signed)
    SUBJECTIVE:   CHIEF COMPLAINT / HPI:   Viral sinusitis Alicia [name] reports that she has been having symptoms for roughly the past 3 days.  She reports that she has been having a scratchy, raw throat in addition to some nasal congestion and pressure under her eyes.  She works at daycare and was feeling particularly bad yesterday with subjective fever and chills.  She ended up going home early and spending the rest of the day at home.  She was encouraged to see her medical provider before returning to work.  She took her temperature at home which was between 97 and 98.  Overall, she feels significantly improved today.  She denies fever, shortness of breath, chest pain, cough, abdominal pain, nausea, changes in bowel movements, rashes.  PERTINENT  PMH / PSH: Seasonal allergies and previous sinusitis  OBJECTIVE:   BP 116/62   Pulse 93   Temp 98 F (36.7 C) (Oral)   SpO2 97%    General: Alert and cooperative and appears to be in no acute distress HEENT: Very mildly erythematous oropharynx without tonsillar enlargement or exudate.  No cervical lymphadenopathy.  Normal TMs visualized bilaterally.  Significant tenderness with palpation of the maxillary sinuses.  No tenderness with palpation of the frontal sinuses. Cardio: Normal S1 and S2, no S3 or S4. Rhythm is regular. No murmurs or rubs.   Pulm: Clear to auscultation bilaterally, no crackles, wheezing, or diminished breath sounds. Normal respiratory effort Abdomen: Bowel sounds normal. Abdomen soft and non-tender.  Extremities: No peripheral edema. Warm/ well perfused.  Strong radial pulses. Neuro: Cranial nerves grossly intact  ASSESSMENT/PLAN:   Acute viral sinusitis Normal vitals in clinic today.  Improved symptoms today.  Overall she seems to be improving.  History and physical are consistent with a viral sinusitis.  She had a negative COVID test 2 days ago.  As the duration of symptoms has only been 3 days so far, this is likely  viral.  She was encouraged to use Tylenol and Motrin for discomfort/fever.  Fluticasone refilled.  She was encouraged to message me if her symptoms last more than 10 days and I could prescribe her a course of antibiotics. -Fluticasone refilled -We will consider antibiotics if symptoms persist for 10 days or more -Work note provided that she is safe to return to work on Monday     Matilde Haymaker, MD [place]

## 2020-11-12 NOTE — Assessment & Plan Note (Signed)
Normal vitals in clinic today.  Improved symptoms today.  Overall she seems to be improving.  History and physical are consistent with a viral sinusitis.  She had a negative COVID test 2 days ago.  As the duration of symptoms has only been 3 days so far, this is likely viral.  She was encouraged to use Tylenol and Motrin for discomfort/fever.  Fluticasone refilled.  She was encouraged to message me if her symptoms last more than 10 days and I could prescribe her a course of antibiotics. -Fluticasone refilled -We will consider antibiotics if symptoms persist for 10 days or more -Work note provided that she is safe to return to work on Monday

## 2020-11-12 NOTE — Patient Instructions (Signed)
I am sorry that you have not been feeling well for the past 3 days.  I think this is most likely related to a sinus infection.  Based on the duration of your symptoms, it is most likely viral.  If your symptoms persist for more than 10 days, please send me a message and I will send you antibiotics.  For sore throat, would recommend tea with honey or over-the-counter cough medicine.  I think it is safe and appropriate for you to return to work on Monday.

## 2020-11-13 NOTE — Progress Notes (Signed)
   Subjective:   Patient ID: Alicia [name]    DOB: 04-08-1956, 65 y.o. female   MRN: [id number]  Alicia [name] is a 65 y.o. female with a history of hypertension, paroxysmal SVT, OSA, chronic idiopathic constipation, GERD, diabetes with polyneuropathy and retinopathy, hypothyroidism, hearing loss, hyperlipidemia, iron deficiency anemia, abdominal wall hernia, seasonal allergies, history of seizure, spinal stenosis L4/5, statin intolerance here for steroid injection  HPI: Patient returns today for continued sciatica, L>R. Denies any bowel/bladder dysfunction or saddle anesthesia. Has chronic low back pain and butt pain. Pain is worse with certain movements. Has not been back to see her neurosurgeon. Has taken the Naproxen but got stomach pain thus discontinued. She has tried many medications without any improvement. She notes taking Gabapentin chronically and has taken it BID and TID of various doses with minimal improvement in symptoms.  Review of Systems:  Per HPI.   Objective:   BP 136/72   Pulse 67   Wt 139 lb (63 kg)   SpO2 96%   BMI 23.86 kg/m  Vitals and nursing note reviewed.  General: pleasant older woman, sitting comfortably in exam chair, well nourished, well developed, in no acute distress with non-toxic appearance Resp: breathing comfortably on room air, speaking in full sentences Skin: warm, dry Extremities: warm and well perfused, normal tone MSK:  gait normal Neuro: Alert and oriented, speech normal  Lumbar Back. No gross deformity, scoliosis. TTP along midline spine and piriformis, L>R.  FROM. Strength LEs 5/5 all muscle groups.   Negative SLRs. Negative fabers and piriformis stretches.  Prior Imaging: MRI Lumbar spine 12/12/19 IMPRESSION: 1. L3-L4 severe spinal canal stenosis with moderate right and mild left neural foraminal stenosis. 2. L2-L3 moderate spinal canal stenosis and mild left neural foraminal stenosis. 3. L4-L5 mild spinal canal  stenosis.  Assessment & Plan:   Bilateral sciatica Chronic in setting of significant lumbar spinal stenosis and foraminal stenosis. Follows with neurosurgery. Minimal improvement with gabapentin and NSAIDs. Discussed difficulty in treatment with goal aimed at helping manage symptoms to maintain ADL's. Patient voiced understanding and agreement with plan. - Tylenol 650mg  q8 hours PRN -  Continue gabapentin 800 mg twice daily - Baclofen 10mg  qHS PRN - Start Cymbalta 30mg  QD x 1 week then increase to 60mg  daily - schedule follow up with neurosurgery - topical analgesics - heating pad - piriformis stretches  No orders of the defined types were placed in this encounter.  Meds ordered this encounter  Medications  . baclofen (LIORESAL) 10 MG tablet    Sig: Take 1 tablet (10 mg total) by mouth at bedtime.    Dispense:  30 each    Refill:  0  . DULoxetine (CYMBALTA) 30 MG capsule    Sig: Take 1 capsule (30 mg total) by mouth daily. Increase to 2 tablets (60mg  total) daily after 1 week.    Dispense:  60 capsule    Refill:  0  . acetaminophen (TYLENOL) 325 MG tablet    Sig: Take 2 tablets (650 mg total) by mouth every 6 (six) hours as needed for moderate pain.    Dispense:  60 tablet    Refill:  1   Due to language barrier, an interpreter was present during the history-taking and subsequent discussion (and for part of the physical exam) with this patient. ASL in-person interpreter    Mina Marble, DO PGY-3, [facility] 11/15/2020 1:48 PM

## 2020-11-15 ENCOUNTER — Other Ambulatory Visit: Payer: Self-pay

## 2020-11-15 ENCOUNTER — Ambulatory Visit (INDEPENDENT_AMBULATORY_CARE_PROVIDER_SITE_OTHER): Payer: Medicare Other | Admitting: Family Medicine

## 2020-11-15 ENCOUNTER — Encounter: Payer: Self-pay | Admitting: Family Medicine

## 2020-11-15 VITALS — BP 136/72 | HR 67 | Wt 139.0 lb

## 2020-11-15 DIAGNOSIS — M5432 Sciatica, left side: Secondary | ICD-10-CM

## 2020-11-15 DIAGNOSIS — M48061 Spinal stenosis, lumbar region without neurogenic claudication: Secondary | ICD-10-CM

## 2020-11-15 DIAGNOSIS — M5431 Sciatica, right side: Secondary | ICD-10-CM | POA: Diagnosis not present

## 2020-11-15 MED ORDER — ACETAMINOPHEN 325 MG PO TABS: 650.0000 mg | ORAL_TABLET | Freq: Four times a day (QID) | ORAL | 1 refills | Status: DC | PRN

## 2020-11-15 MED ORDER — BACLOFEN 10 MG PO TABS: 10.0000 mg | ORAL_TABLET | Freq: Every day | ORAL | 0 refills | Status: DC

## 2020-11-15 MED ORDER — DULOXETINE HCL 30 MG PO CPEP: 30.0000 mg | ORAL_CAPSULE | Freq: Every day | ORAL | 0 refills | Status: DC

## 2020-11-15 NOTE — Patient Instructions (Addendum)
Your pain is due to sciatica from the narrowing of your spinal cord. Please continue to take Tylenol 650mg  every 6 hours as needed I have also called in a muscle relaxer called baclofen.  He can take this medication at night.  You can increase this medicine up to 3 times a day as needed if it does not make you too sleepy. I am also calling in a medicine called Cymbalta.  You will start with 1 tablet (30 mg) a day for 1 week and then increase to 2 tablets (60 mg) a day thereafter.  If you are tolerating well we will continue to refill this medication.  Continue the gabapentin as prescribed. Continue the stretches, topical creams such as capsaicin, Voltaren gel, lidocaine gel.  Also continue to use heating pad. Schedule follow-up with your neurosurgeon to discuss further treatment options

## 2020-11-15 NOTE — Assessment & Plan Note (Addendum)
Chronic in setting of significant lumbar spinal stenosis and foraminal stenosis. Follows with neurosurgery. Minimal improvement with gabapentin and NSAIDs. Discussed difficulty in treatment with goal aimed at helping manage symptoms to maintain ADL's. Patient voiced understanding and agreement with plan. - Tylenol 650mg  q8 hours PRN -  Continue gabapentin 800 mg twice daily - Baclofen 10mg  qHS PRN - Start Cymbalta 30mg  QD x 1 week then increase to 60mg  daily - schedule follow up with neurosurgery - topical analgesics - heating pad - piriformis stretches

## 2020-11-22 ENCOUNTER — Other Ambulatory Visit: Payer: Self-pay

## 2020-11-22 ENCOUNTER — Encounter: Payer: Self-pay | Admitting: Podiatry

## 2020-11-22 ENCOUNTER — Ambulatory Visit (INDEPENDENT_AMBULATORY_CARE_PROVIDER_SITE_OTHER): Payer: Medicare Other | Admitting: Podiatry

## 2020-11-22 DIAGNOSIS — B351 Tinea unguium: Secondary | ICD-10-CM | POA: Diagnosis not present

## 2020-11-22 DIAGNOSIS — M778 Other enthesopathies, not elsewhere classified: Secondary | ICD-10-CM | POA: Diagnosis not present

## 2020-11-22 DIAGNOSIS — K589 Irritable bowel syndrome without diarrhea: Secondary | ICD-10-CM | POA: Insufficient documentation

## 2020-11-22 NOTE — Patient Instructions (Signed)
I have ordered a medication for you that will come from [facility] in [place]. They should be calling you to verify insurance and will mail the medication to you. If you live close by then you can go by their pharmacy to pick up the medication. Their phone number is [phone]. If you do not hear from them in the next few days, please give us a call at [phone].   

## 2020-11-23 NOTE — Progress Notes (Signed)
Subjective: 65 year old female presents the office evaluation of injury to the right foot.  She states that the injection did help.  She stopped wearing the boot after it made her off balance causing discomfort.  She states the pain in the right foot is resolved and not having any issues.  Left hallux toenail is mildly thickened present some occasional discomfort but denies any redness or drainage or any swelling. Denies any systemic complaints such as fevers, chills, nausea, vomiting. No acute changes since last appointment, and no other complaints at this time.   Objective: AAO x3, NAD-presents with interpreter DP/PT pulses palpable bilaterally, CRT less than 3 seconds Not able to elicit any area of tenderness on the right foot there is no edema, erythema the right foot.  On the left foot the left hallux toenail is mildly hypertrophic, dystrophic and mild incurvation.  There is no edema, erythema or any signs of infection of the toenail site. No pain with calf compression, swelling, warmth, erythema  Assessment: Resolved right foot pain; left foot onychomycosis with mild ingrown toenail  Plan: -All treatment options discussed with the patient including all alternatives, risks, complications.  -Right foot is doing well.  Continue with supportive shoe gear -On the left foot extremity debride the toenail and complications of bleeding.  Topical medication for nail fungus.  She never received the compound cream that I ordered so I [facility]. -Patient encouraged to call the office with any questions, concerns, change in symptoms.   Trula Slade DPM

## 2020-11-26 ENCOUNTER — Encounter: Payer: Self-pay | Admitting: Pharmacist

## 2020-11-26 ENCOUNTER — Other Ambulatory Visit: Payer: Self-pay

## 2020-11-26 ENCOUNTER — Ambulatory Visit (INDEPENDENT_AMBULATORY_CARE_PROVIDER_SITE_OTHER): Payer: Medicare Other | Admitting: Pharmacist

## 2020-11-26 DIAGNOSIS — E11311 Type 2 diabetes mellitus with unspecified diabetic retinopathy with macular edema: Secondary | ICD-10-CM

## 2020-11-26 DIAGNOSIS — M5431 Sciatica, right side: Secondary | ICD-10-CM

## 2020-11-26 DIAGNOSIS — M5432 Sciatica, left side: Secondary | ICD-10-CM | POA: Diagnosis not present

## 2020-11-26 DIAGNOSIS — E7800 Pure hypercholesterolemia, unspecified: Secondary | ICD-10-CM | POA: Diagnosis not present

## 2020-11-26 DIAGNOSIS — I10 Essential (primary) hypertension: Secondary | ICD-10-CM | POA: Diagnosis not present

## 2020-11-26 MED ORDER — DULOXETINE HCL 60 MG PO CPEP: 60.0000 mg | ORAL_CAPSULE | Freq: Every day | ORAL | 3 refills | Status: DC

## 2020-11-26 MED ORDER — ACETAMINOPHEN ER 650 MG PO TBCR: 1300.0000 mg | EXTENDED_RELEASE_TABLET | Freq: Three times a day (TID) | ORAL | 3 refills | Status: DC | PRN

## 2020-11-26 NOTE — Progress Notes (Signed)
Reviewed: I agree with Dr. Koval's documentation and management. 

## 2020-11-26 NOTE — Assessment & Plan Note (Signed)
ASCVD risk - primary prevention in patient with diabetes. Last LDL is not controlled. ASCVD risk score is not >20%  - moderate intensity statin indicated. Aspirin is indicated. Control is suboptimal due to statin intolerance. She has lost weight recently and anticipate repeat LDL is at goal. -Continued aspirin 81 mg  -Continued rosuvastatin (Crestor) 5 mg twice weekly.  -Follow up with LDL with next labs, could consider bempadoic acid if above goal

## 2020-11-26 NOTE — Patient Instructions (Addendum)
It was wonderful to see you today!  -Continue to take duloxetine 60mg  (2 of the 30mg  capsules) once daily. We sent a new script for 60mg  capsules to your pharmacy. -Start taking 1300mg  of tylenol (acetaminophen) extended-release twice daily for pain  -Stop taking hydrochlorothiazide (HCTZ), your blood pressure is well controlled, call us if you have high blood pressure at home  -Continue current doses of insulin and Victoza  -follow up in 2 months with Dr. Quentin Cornwall

## 2020-11-26 NOTE — Assessment & Plan Note (Signed)
Back pain currently uncontrolled. Provided education that duloxetine takes several weeks to take full effect. Discussed immediate-release tylenol may not be providing enough release -stop immediate-release acetaminophen -start acetaminophen 1300mg  (2x 650mg ) ER twice daily (sent script to pharmacy) -continue duloxetine 60mg  daily (sent in new script to reflect patient's current dose)

## 2020-11-26 NOTE — Progress Notes (Signed)
S:     Chief Complaint  Patient presents with  . Medication Management    Diabetes med management    Patient arrives in good spirits, accompanied by a sign language interpreter.  Presents for diabetes evaluation, education, and management Patient was referred and last seen by Primary Care Provider Dr. Alba Cory  She is adherent to her insulin regimen. She reports low blood sugars in the 60-70 when she forgets to eat. Denies any low blood sugars when she remembers to eat. Endorses CBGs to the 300s last week when she was sick with COVID. They run around 130 when she is healthy and eating.   She endorses dizziness when she stands, has lost her home blood pressure cuff.  Patient reports back pain. She had a severe muscle spasm last night.Endorses back pain around a 5 today, but is very severe and shoots down her leg sometimes. She is frustrated with the amount of medications she needs to take. She states her tylenol and baclofen are not working for her.  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  Current diabetes medications include: insulin glargine 10 units dailys, insulin aspart 2-4 units with meals, metformin 1000mg  twice daily, liraglutide 1.8 once daily Current hypertension medications include: carvedilol 25mg  twice daily, losartan 100mg  once daily, hydrochlorothiazide 12.5 daily,  Current hyperlipidemia medications include: rosuvastatin 5mg  twice weekly  Patient denies hypoglycemic events, when she remembers to eat.  Patient-reported exercise habits: exercises with her students   Patient denies nocturia (nighttime urination).  Patient denies neuropathy (nerve pain).    O:  Physical Exam Constitutional:      Appearance: Normal appearance. She is normal weight.  Pulmonary:     Effort: Pulmonary effort is normal.  Musculoskeletal:     Right lower leg: No edema.     Left lower leg: No edema.  Neurological:      Mental Status: She is alert.  Psychiatric:        Mood and Affect: Mood normal.        Thought Content: Thought content normal.    Review of Systems  Constitutional: Weight loss: intentional.  Musculoskeletal: Positive for back pain (rated as 5/10, described as bilateral shooting pain).  Neurological: Positive for dizziness (with position change).     Lab Results  Component Value Date   HGBA1C 8.7 (A) 10/27/2020   Vitals:   11/26/20 0831  BP: 128/70  Pulse: 92  SpO2: 98%    Lipid Panel     Component Value Date/Time   CHOL 200 (H) 10/27/2020 0933   TRIG 40 10/27/2020 0933   HDL 88 10/27/2020 0933   CHOLHDL 2.3 10/27/2020 0933   CHOLHDL 2.3 07/28/2016 0936   VLDL 12 07/28/2016 0936   LDLCALC 104 (H) 10/27/2020 0933   LDLDIRECT 78 07/10/2013 1441    Home fasting blood sugars: 70-80  2 hour post-meal/random blood sugars: 130-150.   Clinical Atherosclerotic Cardiovascular Disease (ASCVD): No  The 10-year ASCVD risk score Mikey Bussing DC Jr., et al., 2013) is: 17.1%   Values used to calculate the score:     Age: 60 years     Sex: Female     Is Non-Hispanic African American: Yes     Diabetic: Yes     Tobacco smoker: No     Systolic Blood Pressure: 160 mmHg     Is BP treated: Yes     HDL Cholesterol: 88 mg/dL     Total Cholesterol:  200 mg/dL    A/P: Diabetes longstanding currently above goal. Patient is able to verbalize appropriate hypoglycemia management plan. Medication adherence appears excellent. Recently control is suboptimal due to recent illness however currently doing well and near glycemic  targets.  -Continued basal insulin glargine 10 units daily.  -Continued  rapid insulin aspart 2-4 units with meals three times daily.  -Continue metformin 1000mg  twice daily -Continued GLP-1 Victoza (liraglutide) to 1.8mg  daily.  -Extensively discussed pathophysiology of diabetes, recommended lifestyle interventions, dietary effects on blood sugar control -Counseled on  s/sx of and management of hypoglycemia -Next A1C anticipated 12/2020 (two months).   ASCVD risk - primary prevention in patient with diabetes. Last LDL is not controlled. ASCVD risk score is not >20%  - moderate intensity statin indicated. Aspirin is indicated. Control is suboptimal due to statin intolerance. She has lost weight recently and anticipate repeat LDL is at goal. -Continued aspirin 81 mg  -Continued rosuvastatin (Crestor) 5 mg twice weekly.  -Follow up with LDL with next labs, could consider bempadoic acid if above goal  Hypertension longstanding currently controlled.  Blood pressure goal = <130/80 mmHg. Medication adherence excellent.  Due to orthostatic hypotension and well controlled blood pressure, will discontinue HCTZ -stop hydrochlorothiazide -patient instructed to call if she has high blood pressures at home  Back pain currently uncontrolled. Provided education that duloxetine takes several weeks to take full effect. Discussed immediate-release tylenol may not be providing enough release -stop immediate-release acetaminophen -start acetaminophen 1300mg  (2x 650mg ) ER twice daily (sent script to pharmacy) -continue duloxetine 60mg  daily (sent in new script to reflect patient's current dose)  -received HCTZ, baclofen and short-acting acetaminophen for destruction per patient preference  Written patient instructions provided.  Total time in face to face counseling 45 minutes.   Follow PCP Clinic Visit in 2 months.   Patient seen with Marlowe Alt PharmD Candidate, and Norina Buzzard, PharmD - PGY-1 Resident.

## 2020-11-26 NOTE — Assessment & Plan Note (Signed)
Diabetes longstanding currently above goal. Patient is able to verbalize appropriate hypoglycemia management plan. Medication adherence appears excellent. Recently control is suboptimal due to recent illness however currently doing well and near glycemic  targets.  -Continued basal insulin glargine 10 units daily.  -Continued  rapid insulin aspart 2-4 units with meals three times daily.  -Continue metformin 1000mg  twice daily -Continued GLP-1 Victoza (liraglutide) to 1.8mg  daily.  -Extensively discussed pathophysiology of diabetes, recommended lifestyle interventions, dietary effects on blood sugar control -Counseled on s/sx of and management of hypoglycemia -Next A1C anticipated 12/2020 (two months).

## 2020-11-26 NOTE — Assessment & Plan Note (Signed)
  Hypertension longstanding currently controlled.  Blood pressure goal = <130/80 mmHg. Medication adherence excellent.  Due to orthostatic hypotension and well controlled blood pressure, will discontinue HCTZ -stop hydrochlorothiazide -patient instructed to call if she has high blood pressures at home

## 2020-11-29 ENCOUNTER — Other Ambulatory Visit: Payer: Self-pay | Admitting: Family Medicine

## 2020-11-29 DIAGNOSIS — Z794 Long term (current) use of insulin: Secondary | ICD-10-CM

## 2020-11-29 DIAGNOSIS — E119 Type 2 diabetes mellitus without complications: Secondary | ICD-10-CM

## 2020-12-05 ENCOUNTER — Other Ambulatory Visit: Payer: Self-pay | Admitting: Family Medicine

## 2020-12-08 ENCOUNTER — Other Ambulatory Visit: Payer: Self-pay | Admitting: Family Medicine

## 2020-12-08 DIAGNOSIS — M5432 Sciatica, left side: Secondary | ICD-10-CM

## 2021-01-01 ENCOUNTER — Other Ambulatory Visit: Payer: Self-pay | Admitting: Family Medicine

## 2021-01-07 ENCOUNTER — Other Ambulatory Visit: Payer: Self-pay | Admitting: Family Medicine

## 2021-01-07 DIAGNOSIS — M159 Polyosteoarthritis, unspecified: Secondary | ICD-10-CM

## 2021-01-10 DIAGNOSIS — H43813 Vitreous degeneration, bilateral: Secondary | ICD-10-CM | POA: Diagnosis not present

## 2021-01-10 DIAGNOSIS — E113592 Type 2 diabetes mellitus with proliferative diabetic retinopathy without macular edema, left eye: Secondary | ICD-10-CM | POA: Diagnosis not present

## 2021-01-10 DIAGNOSIS — E113511 Type 2 diabetes mellitus with proliferative diabetic retinopathy with macular edema, right eye: Secondary | ICD-10-CM | POA: Diagnosis not present

## 2021-01-10 DIAGNOSIS — H35373 Puckering of macula, bilateral: Secondary | ICD-10-CM | POA: Diagnosis not present

## 2021-01-10 NOTE — Progress Notes (Signed)
Subjective:    CC: Low back pain w/ B leg pain  I, Molly Weber, LAT, ATC, am serving as scribe for Dr. Lynne Leader.  HPI: Pt is a 65 y/o female presenting w/ chronic low back pain and B LE pain ongoing since 2016.  She has previously been seen by neurology/neurosurgery multiple times w/ her most recent visit being on 10/07/20.  Today, she locates her pain to bilat low back, w/ radiating pain into B buttocks and down legs.  Radiating pain: yes into B Legs LE numbness/tingling: no Aggravating factors: standing, sitting, laying in bed Treatments tried: Baclofen; Cymbalta; Naproxen; Gabapentin; Tylenol  Diagnostic imaging: L-spine MRI- 12/12/19; L-spine and pelvis XR- 09/10/18   Pertinent review of Systems: Fevers or chills  Relevant historical information: History of lumbar spinal stenosis.  Diabetes.  Deaf.  History of hyperglycemia following epidural steroid injection.   Objective:    Vitals:   01/11/21 1048  BP: 132/78  Pulse: 93  SpO2: 97%   General: Well Developed, well nourished, and in no acute distress.   MSK: L-spine normal. Nontender midline. Tender palpation lumbar paraspinal musculature. Decreased lumbar motion. Lower extremity strength is intact bilaterally.   Lab and Radiology Results  EXAM: MRI LUMBAR SPINE WITHOUT CONTRAST   TECHNIQUE: Multiplanar, multisequence MR imaging of the lumbar spine was performed. No intravenous contrast was administered.   COMPARISON:  None.   FINDINGS: Segmentation:  Standard.   Alignment:  Grade 1 retrolisthesis at L2-3 and L3-4   Vertebrae:  No fracture, evidence of discitis, or bone lesion.   Conus medullaris and cauda equina: Conus extends to the L1 level. Conus and cauda equina appear normal.   Paraspinal and other soft tissues: Multiple renal cysts   Disc levels:   T11-12: Normal.   T12-L1: Normal.   L1-L2: Normal disc space and facet joints. There is no spinal canal stenosis. No neural foraminal  stenosis.   L2-L3: Intermediate disc bulge. Moderate spinal canal stenosis. Mild left neural foraminal stenosis.   L3-L4: Intermediate disc bulge. Mild facet hypertrophy with ligamentum flavum redundancy. Severe spinal canal stenosis. Moderate right and mild left neural foraminal stenosis.   L4-L5: Small disc bulge and mild facet hypertrophy. Mild spinal canal stenosis. No neural foraminal stenosis.   L5-S1: Normal disc space and facet joints. There is no spinal canal stenosis. No neural foraminal stenosis.   Visualized sacrum: Normal.   IMPRESSION: 1. L3-L4 severe spinal canal stenosis with moderate right and mild left neural foraminal stenosis. 2. L2-L3 moderate spinal canal stenosis and mild left neural foraminal stenosis. 3. L4-L5 mild spinal canal stenosis.     Electronically Signed   By: Ulyses Jarred M.D.   On: 12/13/2019 00:10  I, Lynne Leader, personally (independently) visualized and performed the interpretation of the images attached in this note.     Impression and Recommendations:    Assessment and Plan: 65 y.o. female with low back pain with pain radiating into bilateral posterior thighs.  Pain primarily due to lumbosacral spasm and dysfunction and probably facetogenic back pain.  Discussed options.  Based on her poor prior response to steroid injections in her back would like to avoid that route for now.  Plan for trial of physical therapy.  She should benefit from core strengthening and dry needling.  We will recheck in 6 weeks.  If not better at that time would likely proceed with medial branch block and ablation facet joints probably starting with L3-L4 bilaterally working down from there.  American sign language interpreter used.  PDMP not reviewed this encounter. Orders Placed This Encounter  Procedures   Ambulatory referral to Physical Therapy    Referral Priority:   Routine    Referral Type:   Physical Medicine    Referral Reason:   Specialty  Services Required    Requested Specialty:   Physical Therapy    Number of Visits Requested:   1   No orders of the defined types were placed in this encounter.   Discussed warning signs or symptoms. Please see discharge instructions. Patient expresses understanding.   The above documentation has been reviewed and is accurate and complete Lynne Leader, M.D.

## 2021-01-11 ENCOUNTER — Ambulatory Visit (INDEPENDENT_AMBULATORY_CARE_PROVIDER_SITE_OTHER): Payer: Medicare Other | Admitting: Family Medicine

## 2021-01-11 ENCOUNTER — Other Ambulatory Visit: Payer: Self-pay

## 2021-01-11 VITALS — BP 132/78 | HR 93 | Ht 64.0 in | Wt 133.6 lb

## 2021-01-11 DIAGNOSIS — M5441 Lumbago with sciatica, right side: Secondary | ICD-10-CM

## 2021-01-11 DIAGNOSIS — G8929 Other chronic pain: Secondary | ICD-10-CM

## 2021-01-11 DIAGNOSIS — M5442 Lumbago with sciatica, left side: Secondary | ICD-10-CM | POA: Diagnosis not present

## 2021-01-11 NOTE — Patient Instructions (Signed)
Thank you for coming in today.   I've referred you to Physical Therapy.  Let us know if you don't hear from them in one week.   Recheck in 6 weeks.   Schedule follow up visit with ASL interpreter.  30 min visit.

## 2021-01-14 ENCOUNTER — Other Ambulatory Visit: Payer: Self-pay | Admitting: Family Medicine

## 2021-01-14 DIAGNOSIS — Z794 Long term (current) use of insulin: Secondary | ICD-10-CM

## 2021-01-14 DIAGNOSIS — E118 Type 2 diabetes mellitus with unspecified complications: Secondary | ICD-10-CM

## 2021-01-18 ENCOUNTER — Ambulatory Visit: Payer: Medicare Other | Admitting: Family Medicine

## 2021-01-19 MED ORDER — ACCU-CHEK AVIVA PLUS VI STRP: ORAL_STRIP | 3 refills | Status: DC

## 2021-01-19 NOTE — Telephone Encounter (Signed)
Patient calls nurse line regarding insurance not covering glucose test strips. Spoke with pharmacist. Reports that provider (Simmons-Robinson) is not [place] certified.   Spoke with Dr. Andria Frames who gave verbal okay to send test strips.    Called and left HIPAA compliant VM informing patient that test strips were resent.   Talbot Grumbling, RN

## 2021-01-19 NOTE — Addendum Note (Signed)
Addended by: Talbot Grumbling on: 01/19/2021 09:26 AM   Modules accepted: Orders

## 2021-01-25 ENCOUNTER — Telehealth: Payer: Self-pay

## 2021-01-25 NOTE — Telephone Encounter (Signed)
Patient LVM on nurse line requesting to speak with nurse. Attempted to return call to patient, no answer. Left VM for patient to return call to office.   Talbot Grumbling, RN

## 2021-01-26 NOTE — Telephone Encounter (Signed)
Recommend patient schedule appointment for evaluation of rectal bleeding ASAP.   Eulis Foster, MD [place], PGY-3 [phone]

## 2021-01-26 NOTE — Telephone Encounter (Signed)
Patient calls nurse line regarding hemorrhoids. Patient states that she has noticed small amount of bleeding when she wipes. Patient states that she has been treating with tucks pads and OTC hemorrhoid cream. Patient reports that she feels that hemorrhoid is shrinking.   Patient reports history of hemorrhoids.   Please advise additional recommendations or if new medication can be sent in. Patient had concerns as her hemorrhoids have never bled before. Advised patient of ED/UC precautions.   Patient verbalizes understanding.   Talbot Grumbling, RN

## 2021-01-27 ENCOUNTER — Other Ambulatory Visit: Payer: Self-pay

## 2021-01-27 ENCOUNTER — Encounter (HOSPITAL_COMMUNITY): Payer: Self-pay | Admitting: Emergency Medicine

## 2021-01-27 ENCOUNTER — Ambulatory Visit (HOSPITAL_COMMUNITY)
Admission: EM | Admit: 2021-01-27 | Discharge: 2021-01-27 | Disposition: A | Discharge status: Home / Self Care | Payer: Medicare Other | Attending: Emergency Medicine | Admitting: Emergency Medicine

## 2021-01-27 DIAGNOSIS — K649 Unspecified hemorrhoids: Secondary | ICD-10-CM

## 2021-01-27 MED ORDER — HYDROCORTISONE (PERIANAL) 2.5 % EX CREA: 1.0000 "application " | TOPICAL_CREAM | Freq: Two times a day (BID) | CUTANEOUS | 0 refills | Status: DC

## 2021-01-27 NOTE — ED Triage Notes (Signed)
Pt is present today with bleeding hemorrhoids. Pt states that the bleeding started today. Pt states that she has a hx of hemorrhoids.

## 2021-01-27 NOTE — ED Provider Notes (Signed)
Alicia [name]    CSN: [id number] Arrival date & time: 01/27/21  [id number]      History   Chief Complaint Chief Complaint  Patient presents with   Hemorrhoids   Diarrhea    HPI Alicia [name] is a 65 y.o. female.   Patient here for evaluation of bleeding hemorrhoids that started 3 days ago.  Reports history of hemorrhoids but that they have never bled before.  Reports bleeding was initially light but got worse after having a BM this morning.  Reports wearing a pad due to bleeding.  Denies any constipation.  Denies any dizziness.  Denies any trauma, injury, or other precipitating event.  Denies any specific alleviating or aggravating factors.  Denies any fevers, chest pain, shortness of breath, N/V/D, numbness, tingling, weakness, abdominal pain, or headaches.     The history is provided by the patient.  Diarrhea  Past Medical History:  Diagnosis Date   Anemia    ARTHRITIS, KNEE 09/17/2007   Asthma 04/04/2010   Cataract 01/07/2019   Closed fracture of distal end of left radius 11/01/2015   Complete deafness    Meningitis at age 3   Deaf    Diabetes mellitus    Diabetic neuropathy ([place])    Ganglion cyst 06/22/2011   Gastroparesis    GERD (gastroesophageal reflux disease)    H/O: C-section    Hyperlipidemia    Hypertension    Neuromuscular disorder ([place])    diabetic neuropathy   PSVT (paroxysmal supraventricular tachycardia) ([place]) 11/09/2017   Event monitor 09/14/2017 - Predominantly sinus rhythm with episodes of narrow-complex tachycardia suggestive of paroxysmal supraventricular tachycardia.   S/P appy     Patient Active Problem List   Diagnosis Date Noted   Irritable bowel syndrome 11/22/2020   Acute viral sinusitis 11/12/2020   Bilateral sciatica 10/27/2020   Greater trochanteric bursitis of both hips 10/27/2020   Chronic idiopathic constipation 06/25/2020   Elevated blood sugar 06/10/2020   Hernia of abdominal wall 01/28/2020   Spinal stenosis at L4-L5 level  01/12/2020   Transient neurologic deficit 12/22/2019   Frequent loose stools 04/20/2019   Hyperthyroidism 03/07/2019   Hypertension 01/07/2019   Hypercalcemia 10/08/2018   Seasonal allergies 08/27/2018   Osteoarthritis involving multiple joints on both sides of body 05/25/2018   Statin intolerance 12/26/2017   PSVT (paroxysmal supraventricular tachycardia) ([place]) 11/09/2017   Chest pain 08/30/2017   Hyperlipidemia 08/30/2017   OSA (obstructive sleep apnea) 08/31/2016   Diabetes ([place]) 09/14/2015   Muscle pain 09/28/2014   Seizure ([place]) 06/08/2014   Iron deficiency anemia 07/01/2012   Diabetic retinopathy ([place]) 07/21/2011   GERD 12/30/2007   Diabetic polyneuropathy ([place]) 08/30/2006   Type 2 diabetes mellitus with ophthalmic complication ([place]) [id number]   HYPERCHOLESTEROLEMIA 08/30/2006   Hearing loss 08/30/2006    Past Surgical History:  Procedure Laterality Date   APPENDECTOMY     cardiolyte EF 77%, no ischemia in 2006  2006   CARPAL TUNNEL RELEASE Right 04/20/2015   Procedure: RIGHT CARPAL TUNNEL RELEASE;  Surgeon: Daryll Brod, MD;  Location: [place];  Service: Orthopedics;  Laterality: Right;   CARPAL TUNNEL RELEASE Left 11/04/2015   Procedure: CARPAL TUNNEL RELEASE;  Surgeon: Daryll Brod, MD;  Location: [place];  Service: Orthopedics;  Laterality: Left;   CESAREAN SECTION     OPEN REDUCTION INTERNAL FIXATION (ORIF) DISTAL RADIAL FRACTURE Left 11/04/2015   Procedure: OPEN REDUCTION INTERNAL FIXATION (ORIF) LEFT DISTAL RADIAL FRACTURE POSSIBLE BONE GRAFT;  Surgeon:  Daryll Brod, MD;  Location: [place];  Service: Orthopedics;  Laterality: Left;   TRIGGER FINGER RELEASE Right 04/20/2015   Procedure: RELEASE TRIGGER FINGER/A-1 PULLEY RIGHT MIDDLE FINGER,RIGHT RING FINGER;  Surgeon: Daryll Brod, MD;  Location: [place];  Service: Orthopedics;  Laterality: Right;   TUBAL LIGATION     ULNAR NERVE TRANSPOSITION Right  04/20/2015   Procedure: RIGHT ULNAR NERVE DECOMPRESSION;  Surgeon: Daryll Brod, MD;  Location: [place];  Service: Orthopedics;  Laterality: Right;   UTERINE FIBROID EMBOLIZATION  2007   WRIST SURGERY     Cyst removed on left   WRIST SURGERY Right 1985   tendon repair R wrist    OB History   No obstetric history on file.      Home Medications    Prior to Admission medications   Medication Sig Start Date End Date Taking? Authorizing Provider  hydrocortisone (ANUSOL-HC) 2.5 % rectal cream Place 1 application rectally 2 (two) times daily. 01/27/21  Yes Pearson Forster, NP  ACCU-CHEK SOFTCLIX LANCETS lancets TEST 4 TIMES A DAY 07/11/17   Everrett Coombe, MD  acetaminophen (TYLENOL) 650 MG CR tablet Take 2 tablets (1,300 mg total) by mouth every 8 (eight) hours as needed for pain. 11/26/20   Zenia Resides, MD  aspirin 81 MG chewable tablet Chew 81 mg by mouth daily.    [provider]  carvedilol (COREG) 25 MG tablet TAKE 1 TABLET BY MOUTH TWICE A DAY (NEEDS APPOINTMENT FOR ANY FUTURE REFILLS) 2ND ATTEMPT. 11/12/20   Baldwin Jamaica, PA-C  cholecalciferol (VITAMIN D) 1000 UNITS tablet Take 1,000 Units by mouth daily.    [provider]  Cyanocobalamin (VITAMIN B 12) 500 MCG TABS 1 tablet    [provider]  diclofenac Sodium (VOLTAREN) 1 % GEL Apply 2 g topically 4 (four) times daily. Rub into affected area of foot 2 to 4 times daily 09/18/19   Trula Slade, DPM  diltiazem (CARDIZEM CD) 240 MG 24 hr capsule TAKE 1 CAPSULE BY MOUTH EVERY DAY 08/23/20   Simmons-Robinson, Makiera, MD  diltiazem (CARDIZEM) 30 MG tablet TAKE 1 TABLET BY MOUTH AS NEEDED (HEART RACING OR PALPITATIONS THAT LAST LONGER THAN 15 MINS). 07/10/19   Richardson Dopp T, PA-C  DULoxetine (CYMBALTA) 60 MG capsule Take 1 capsule (60 mg total) by mouth daily. 11/26/20   Zenia Resides, MD  famotidine (PEPCID) 20 MG tablet Take 20 mg by mouth 2 (two) times daily. 08/27/19   [provider]  ferrous sulfate 325 (65 FE) MG tablet Take 325 mg by mouth daily with breakfast. Patient not taking: No sig reported    [provider]  fluticasone (FLONASE) 50 MCG/ACT nasal spray SPRAY 2 SPRAYS INTO EACH NOSTRIL EVERY DAY 11/12/20   Matilde Haymaker, MD  gabapentin (NEURONTIN) 800 MG tablet TAKE 1 TABLET BY MOUTH TWICE A DAY AS NEEDED 11/08/20   Simmons-Robinson, Makiera, MD  glucose blood (ACCU-CHEK AVIVA PLUS) test strip Please use to check blood sugar three times per day. E11.8 01/19/21   Zenia Resides, MD  insulin aspart (NOVOLOG) 100 UNIT/ML FlexPen Inject 2-4 Units into the skin 3 (three) times daily with meals. 10/29/20   Simmons-Robinson, Makiera, MD  Insulin Glargine (BASAGLAR KWIKPEN) 100 UNIT/ML Inject 10 Units into the skin daily. 10/27/20   Mullis, Kiersten P, DO  Insulin Pen Needle 31G X 5 MM MISC 1 Container by Does not apply route once as needed for up  to 1 dose. 10/27/20   Mullis, Kiersten P, DO  Lactobacillus Casei-Folic Acid (RESTORA RX) 60-1.25 MG CAPS 1 capsule between meals 11/10/20   [provider]  LINZESS 72 MCG capsule Take 72 mcg by mouth every morning. 09/10/19   [provider]  liraglutide (VICTOZA) 18 MG/3ML SOPN Inject 1.8 mg into the skin 1 day or 1 dose for 120 doses. 10/27/20 02/24/21  Mullis, Kiersten P, DO  losartan (COZAAR) 100 MG tablet Take by mouth. 04/28/20   [provider]  meloxicam (MOBIC) 15 MG tablet TAKE 1 TABLET BY MOUTH EVERY DAY 01/07/21   Simmons-Robinson, Riki Sheer, MD  metFORMIN (GLUCOPHAGE) 1000 MG tablet TAKE 1 TABLET BY MOUTH TWICE A DAY 11/30/20   Simmons-Robinson, Riki Sheer, MD  Misc Natural Products (GLUCOSAMINE CHOND MSM FORMULA) TABS Take by mouth daily.    [provider]  NON FORMULARY [facility]  Antifungal (nail)-#1    [provider]  Olopatadine HCl 0.2 % SOLN PLACE 1 DROP IN BOTH EYES DAILY 03/24/19   [provider]  pantoprazole (PROTONIX) 40 MG tablet  Take 40 mg by mouth every morning. 08/23/20   [provider]  rosuvastatin (CRESTOR) 5 MG tablet TAKE 1 TABLET BY MOUTH 2 (TWO) TIMES A WEEK. 05/07/20   Camnitz, Will Hassell Done, MD  valsartan (DIOVAN) 160 MG tablet TAKE 1 TABLET (160 MG TOTAL) BY MOUTH AT BEDTIME. 01/04/21   Simmons-Robinson, Makiera, MD    Family History Family History  Problem Relation Age of Onset   Hypertension Mother    Heart attack Mother 82   Diabetes Father    Cancer Maternal Aunt    Cancer Maternal Grandmother    Neuropathy Neg Hx    Breast cancer Neg Hx     Social History Social History   Tobacco Use   Smoking status: Never   Smokeless tobacco: Never  Vaping Use   Vaping Use: Never used  Substance Use Topics   Alcohol use: Yes    Alcohol/week: 3.0 standard drinks    Types: 3 Glasses of wine per week    Comment: 1-2 wine occasional    Drug use: Never     Allergies   Sulfonamide derivatives, Lipitor [atorvastatin calcium], Ramipril, and Trulicity [dulaglutide]   Review of Systems Review of Systems  Gastrointestinal:  Positive for blood in stool, diarrhea and rectal pain.  All other systems reviewed and are negative.   Physical Exam Triage Vital Signs ED Triage Vitals  Enc Vitals Group     BP 01/27/21 1117 (!) 145/76     Pulse Rate 01/27/21 1117 83     Resp 01/27/21 1117 18     Temp 01/27/21 1117 97.8 F (36.6 C)     Temp src --      SpO2 01/27/21 1117 94 %     Weight --      Height --      Head Circumference --      Peak Flow --      Pain Score 01/27/21 1115 4     Pain Loc --      Pain Edu? --      Excl. in [place]? --    No data found.  Updated Vital Signs BP (!) 145/76 (BP Location: Left Arm)   Pulse 83   Temp 97.8 F (36.6 C)   Resp 18   SpO2 94%   Visual Acuity Right Eye Distance:   Left Eye Distance:   Bilateral Distance:    Right Eye  Near:   Left Eye Near:    Bilateral Near:     Physical Exam Vitals and nursing note reviewed.  Constitutional:       General: She is not in acute distress.    Appearance: Normal appearance. She is not ill-appearing, toxic-appearing or diaphoretic.  HENT:     Head: Normocephalic and atraumatic.  Eyes:     Conjunctiva/sclera: Conjunctivae normal.  Cardiovascular:     Rate and Rhythm: Normal rate.     Pulses: Normal pulses.  Pulmonary:     Effort: Pulmonary effort is normal.  Abdominal:     General: Abdomen is flat.  Musculoskeletal:        General: Normal range of motion.     Cervical back: Normal range of motion.  Skin:    General: Skin is warm and dry.  Neurological:     General: No focal deficit present.     Mental Status: She is alert and oriented to person, place, and time.  Psychiatric:        Mood and Affect: Mood normal.     UC Treatments / Results  Labs (all labs ordered are listed, but only abnormal results are displayed) Labs Reviewed - No data to display  EKG   Radiology No results found.  Procedures Procedures (including critical care time)  Medications Ordered in UC Medications - No data to display  Initial Impression / Assessment and Plan / UC Course  I have reviewed the triage vital signs and the nursing notes.  Pertinent labs & imaging results that were available during my care of the patient were reviewed by me and considered in my medical decision making (see chart for details).    Assessment negative for red flags or concerns.  Likely hemorrhoids.  May use Anusol twice a day as needed for pain.  May also use Tucks pads or Preparation H as needed.  Recommend Colace if having a bowel movement is painful and increase fiber in diet.  Strict ED follow-up for any worsening symptoms or red flags.  Follow-up with general surgery if symptoms do not improve for possible removal.  Follow-up with primary care  Final Clinical Impressions(s) / UC Diagnoses   Final diagnoses:  Hemorrhoids, unspecified hemorrhoid type     Discharge Instructions      You can use the  Anusol twice a day as needed for pain due to hemorrhoids.   You can use Tucks pads or preparation H as needed.    If the bleeding gets worse, you get dizziness or lightheaded, go to the Emergency Department for further evaluation.    You can follow up with general surgery for evaluation and possible removal.    Follow up with your primary care provider as needed.       ED Prescriptions     Medication Sig Dispense Auth. Provider   hydrocortisone (ANUSOL-HC) 2.5 % rectal cream Place 1 application rectally 2 (two) times daily. 30 g Pearson Forster, NP      PDMP not reviewed this encounter.   Pearson Forster, NP 01/27/21 1234

## 2021-01-27 NOTE — Discharge Instructions (Addendum)
You can use the Anusol twice a day as needed for pain due to hemorrhoids.   You can use Tucks pads or preparation H as needed.    If the bleeding gets worse, you get dizziness or lightheaded, go to the Emergency Department for further evaluation.    You can follow up with general surgery for evaluation and possible removal.    Follow up with your primary care provider as needed.

## 2021-01-27 NOTE — Telephone Encounter (Signed)
Patient returns call to nurse line. Patient reports that overnight she noticed increase in bleeding. Patient reports that she is now having "a lot of bleeding" Patient reports that she has saturated an entire pad this morning.   Advised ED evaluation as soon as possible. Patient states she will go to the ED for further evaluation.   Scheduled follow up in clinic next Thursday at 8:30.   Talbot Grumbling, RN

## 2021-01-31 ENCOUNTER — Ambulatory Visit: Payer: Medicare Other | Admitting: Physical Therapy

## 2021-02-02 ENCOUNTER — Other Ambulatory Visit: Payer: Self-pay | Admitting: Family Medicine

## 2021-02-03 ENCOUNTER — Ambulatory Visit (INDEPENDENT_AMBULATORY_CARE_PROVIDER_SITE_OTHER): Payer: Medicare Other | Admitting: Family Medicine

## 2021-02-03 ENCOUNTER — Other Ambulatory Visit: Payer: Self-pay

## 2021-02-03 VITALS — BP 140/78 | HR 88 | Ht 64.0 in | Wt 134.4 lb

## 2021-02-03 DIAGNOSIS — K625 Hemorrhage of anus and rectum: Secondary | ICD-10-CM

## 2021-02-03 DIAGNOSIS — K649 Unspecified hemorrhoids: Secondary | ICD-10-CM | POA: Insufficient documentation

## 2021-02-03 NOTE — Progress Notes (Signed)
    SUBJECTIVE:   CHIEF COMPLAINT / HPI:   ED follow-up for hemorrhoids Patient was seen in the emergency department on 7/28 for rectal bleeding.  Was evaluated and found to have bleeding hemorrhoids.  Was told to use Anusol twice a day as needed as well as Tucks pads or Preparation H as needed.  She was also recommended to use Colace and increase her fiber diet.  Since being seen in the emergency department she reports she is still had issues with bleeding from her hemorrhoids.  She reports that it has not been copious amounts but it has been a fair amount.  She would like to see someone about removal of the hemorrhoids.  Denies any signs or symptoms of anemia such as lightheadedness, dizziness, shortness of breath, weakness, fatigue.  Is up-to-date on her colon cancer screening with most recent colonoscopy in July 2020.  OBJECTIVE:   Ht '5\' 4"'$  (1.626 m)   Wt 134 lb 6.4 oz (61 kg)   BMI 23.07 kg/m   General: Well-appearing, no acute distress Cardiac: Regular rate and rhythm, no murmurs appreciated Respiratory: Normal work of breathing, lungs clear to auscultation bilaterally Abdomen: Soft, nontender, positive bowel sounds MSK: No gross abnormalities Rectal exam: Deferred per patient's preference  ASSESSMENT/PLAN:   No problem-specific Assessment & Plan notes found for this encounter.     Gifford Shave, MD [place]

## 2021-02-03 NOTE — Patient Instructions (Signed)
It was a pleasure seeing you today.  I am sorry you are still having issues with your hemorrhoids.  I have placed a referral for general surgery for them to evaluate you for possible removal of the hemorrhoids.  Someone should be calling you to schedule an appointment.  If your bleeding worsens or you have any of the symptoms that we discussed regarding anemia please seek medical attention in the emergency department.  I am going to check your hemoglobin today to ensure that you have not lost too much blood.  If you have any questions or concerns please call the clinic.  I hope you have a wonderful day!

## 2021-02-03 NOTE — Assessment & Plan Note (Signed)
Patient presents for follow-up after being seen in the emergency department and told she had hemorrhoids.  She would like a consult for general surgery for evaluation for removal.  Consult placed.  Denies any signs or symptoms of anemia but checking CBC today to ensure she is not anemic.  I will call her with results if any abnormalities.  Strict ED and return precautions given and patient is agreeable to this.  Follow-up as needed.

## 2021-02-04 LAB — CBC
Hematocrit: 32.8 % — ABNORMAL LOW (ref 34.0–46.6)
Hemoglobin: 10.4 g/dL — ABNORMAL LOW (ref 11.1–15.9)
MCH: 27.4 pg (ref 26.6–33.0)
MCHC: 31.7 g/dL (ref 31.5–35.7)
MCV: 87 fL (ref 79–97)
Platelets: 369 10*3/uL (ref 150–450)
RBC: 3.79 x10E6/uL (ref 3.77–5.28)
RDW: 14.6 % (ref 11.7–15.4)
WBC: 5.5 10*3/uL (ref 3.4–10.8)

## 2021-02-07 ENCOUNTER — Other Ambulatory Visit: Payer: Self-pay | Admitting: Family Medicine

## 2021-02-14 ENCOUNTER — Encounter: Payer: Self-pay | Admitting: Rehabilitative and Restorative Service Providers"

## 2021-02-14 ENCOUNTER — Other Ambulatory Visit: Payer: Self-pay

## 2021-02-14 ENCOUNTER — Ambulatory Visit (INDEPENDENT_AMBULATORY_CARE_PROVIDER_SITE_OTHER): Payer: Medicare Other | Admitting: Rehabilitative and Restorative Service Providers"

## 2021-02-14 DIAGNOSIS — R262 Difficulty in walking, not elsewhere classified: Secondary | ICD-10-CM

## 2021-02-14 DIAGNOSIS — G8929 Other chronic pain: Secondary | ICD-10-CM

## 2021-02-14 DIAGNOSIS — M5441 Lumbago with sciatica, right side: Secondary | ICD-10-CM

## 2021-02-14 DIAGNOSIS — M79605 Pain in left leg: Secondary | ICD-10-CM

## 2021-02-14 DIAGNOSIS — M79604 Pain in right leg: Secondary | ICD-10-CM

## 2021-02-14 DIAGNOSIS — M5442 Lumbago with sciatica, left side: Secondary | ICD-10-CM

## 2021-02-14 NOTE — Therapy (Signed)
[facility] [address] [place], [place], [address] Phone: [phone]   Fax:  [phone]  Physical Therapy Evaluation  Patient Details  Name: Alicia [name] MRN: [id number] Date of Birth: January 14, 1956 Referring Provider (PT): Gregor Hams, MD   Encounter Date: 02/14/2021   PT End of Session - 02/14/21 0758     Visit Number 1    Number of Visits 20    Date for PT Re-Evaluation 04/25/21    Authorization Type Medicare KX at 15    Progress Note Due on Visit 10    PT Start Time 0800    PT Stop Time 0840    PT Time Calculation (min) 40 min    Activity Tolerance Patient tolerated treatment well    Behavior During Therapy [facility] for tasks assessed/performed             Past Medical History:  Diagnosis Date   Anemia    ARTHRITIS, KNEE 09/17/2007   Asthma 04/04/2010   Cataract 01/07/2019   Closed fracture of distal end of left radius 11/01/2015   Complete deafness    Meningitis at age 42   Deaf    Diabetes mellitus    Diabetic neuropathy ([place])    Ganglion cyst 06/22/2011   Gastroparesis    GERD (gastroesophageal reflux disease)    H/O: C-section    Hyperlipidemia    Hypertension    Neuromuscular disorder ([place])    diabetic neuropathy   PSVT (paroxysmal supraventricular tachycardia) ([place]) 11/09/2017   Event monitor 09/14/2017 - Predominantly sinus rhythm with episodes of narrow-complex tachycardia suggestive of paroxysmal supraventricular tachycardia.   S/P appy     Past Surgical History:  Procedure Laterality Date   APPENDECTOMY     cardiolyte EF 77%, no ischemia in 2006  2006   [place] Right 04/20/2015   Procedure: RIGHT CARPAL TUNNEL RELEASE;  Surgeon: Daryll Brod, MD;  Location: [place];  Service: Orthopedics;  Laterality: Right;   CARPAL TUNNEL RELEASE Left 11/04/2015   Procedure: CARPAL TUNNEL RELEASE;  Surgeon: Daryll Brod, MD;  Location: [place];  Service: Orthopedics;  Laterality:  Left;   CESAREAN SECTION     OPEN REDUCTION INTERNAL FIXATION (ORIF) DISTAL RADIAL FRACTURE Left 11/04/2015   Procedure: OPEN REDUCTION INTERNAL FIXATION (ORIF) LEFT DISTAL RADIAL FRACTURE POSSIBLE BONE GRAFT;  Surgeon: Daryll Brod, MD;  Location: [place];  Service: Orthopedics;  Laterality: Left;   TRIGGER FINGER RELEASE Right 04/20/2015   Procedure: RELEASE TRIGGER FINGER/A-1 PULLEY RIGHT MIDDLE FINGER,RIGHT RING FINGER;  Surgeon: Daryll Brod, MD;  Location: [place];  Service: Orthopedics;  Laterality: Right;   TUBAL LIGATION     ULNAR NERVE TRANSPOSITION Right 04/20/2015   Procedure: RIGHT ULNAR NERVE DECOMPRESSION;  Surgeon: Daryll Brod, MD;  Location: [place];  Service: Orthopedics;  Laterality: Right;   UTERINE FIBROID EMBOLIZATION  2007   WRIST SURGERY     Cyst removed on left   WRIST SURGERY Right 1985   tendon repair R wrist    There were no vitals filed for this visit.    Subjective Assessment - 02/14/21 0807     Subjective Pt. comes to clinic c lower back pain and noticing pain in both hips.  Pt. stated history of pain back in 2016 with severe pain with ER visit and injection.  Pt. stated the injection helped alot.  Pt. stated more recent onset of symptoms again and also noticed some complaints  into bilateral legs.  Pt. indicated therapy referral first.    Patient is accompained by: Interpreter    Limitations Standing;Walking;House hold activities;Sitting    How long can you sit comfortably? 5 mins    How long can you stand comfortably? <30 mins    Patient Stated Goals Reduce pain    Currently in Pain? Yes    Pain Score 5    at worst 8/10   Pain Location Back    Pain Orientation Left;Right;Lower    Pain Descriptors / Indicators Constant;Sharp    Pain Type Chronic pain    Pain Radiating Towards bilateral posterior legs (denied tingling), worse on Lt.  To back of knees.    Pain Onset More than a month ago    Pain Frequency  Constant    Aggravating Factors  standing, sitting, sleeping, walking    Pain Relieving Factors previous injection    Effect of Pain on Daily Activities Limiting in standing, walking, sleeping.                [facility] PT Assessment - 02/14/21 0001       Assessment   Medical Diagnosis M54.41,M54.42,G89.29 (ICD-10-CM) - Chronic left-sided low back pain with bilateral sciatica    Referring Provider (PT) Gregor Hams, MD    Onset Date/Surgical Date --   acute on chronic, worsened in last calendar year or so (approx. 1 year)   Hand Dominance Right      Precautions   Precautions None      Restrictions   Weight Bearing Restrictions No      Balance Screen   Has the patient fallen in the past 6 months No    Has the patient had a decrease in activity level because of a fear of falling?  No    Is the patient reluctant to leave their home because of a fear of falling?  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  x 5 repeated in standing movement improved to 75%    Lumbar - Right Side Bend movement to lateral femoral epicondyle c bilateral back pain    Lumbar - Left Side Bend movement to lateral femoral epicondyle c bilateral back pain      Strength   Overall Strength Comments Myotomal check WFL.  Pain noted c Rt hip flexion MMT    Strength Assessment  Site Hip;Knee;Ankle    Right/Left Hip Left;Right    Right Hip Flexion 5/5    Left Hip Flexion 5/5    Right/Left Knee Left;Right    Right Knee Flexion 5/5    Right Knee Extension 5/5    Left Knee Flexion 5/5    Left Knee  Extension 5/5    Right/Left Ankle Left;Right    Right Ankle Dorsiflexion 5/5    Left Ankle Dorsiflexion 5/5      Flexibility   Soft Tissue Assessment /Muscle Length yes    Hamstrings 90 degrees bilateral c pain in back c Lt leg   in passive SLR supine     Palpation   Palpation comment Tenderness to light touch across lumbar, bilateral posterior/lateral hips      Special Tests   Other special tests (-) Slump bilateral, (-) crossed SLR for contralteral leg symptoms      Transfers   Comments Increased difficulty/pain c sit to stand from 18 inch table, supine to sit transfers                        Objective measurements completed on examination: See above findings.       [place] Adult PT Treatment/Exercise - 02/14/21 0001       Exercises   Exercises Other Exercises;Lumbar    Other Exercises  HEP instruction/performance c cues for techniques, handout provided.  Trial set performed of each for comprehension and symptom assessment.  HEP consisting of standing lumbar extension, supine bridge, supine LTR stretch bilateral, single knee to chest bilateral per handout                    PT Education - 02/14/21 0830     Education Details HEP, POC, neural sensitization    Person(s) Educated Patient    Methods Explanation;Demonstration;Verbal cues;Handout    Comprehension Returned demonstration;Verbalized understanding              PT Short Term Goals - 02/14/21 0758       PT SHORT TERM GOAL #1   Title Patient will demonstrate independent use of home exercise program to maintain progress from in clinic treatments.    Time 3    Period Weeks    Status New    Target Date 03/07/21               PT Long Term Goals - 02/14/21  0759       PT LONG TERM GOAL #1   Title Patient will demonstrate/report pain at worst less than or equal to 2/10 to facilitate minimal limitation in daily activity secondary to pain symptoms.    Time 10    Period Weeks    Status New    Target Date 04/25/21      PT LONG TERM GOAL #2   Title Patient will demonstrate independent use of home exercise program to facilitate ability to maintain/progress functional gains from skilled physical therapy services.    Time 10    Period Weeks    Status New    Target Date 04/25/21      PT LONG TERM GOAL #3   Title Pt. will demonstrate FOTO outcome > or =58% to indicated reduced disability due to condition.    Time 10    Period Weeks    Status New    Target Date 04/25/21      PT LONG TERM GOAL #4   Title Pt. will demonstrate active lumbar extension in standing 100 % WFL s symptoms to facilitate upright standing, walking at PLOF.    Time 10    Period Weeks    Status New    Target Date 04/25/21      PT LONG TERM GOAL #5   Title Pt. will demonstrate lateral flexion  to knee joint s symptoms to facilitate bed mobility and bending at PLOF.    Time 10    Period Weeks    Status New    Target Date 04/25/21                    Plan - 02/14/21 0800     Clinical Impression Statement Patient is a 65 y.o. who comes to clinic with complaints of low back pain c bilateral posterior LE pain with mobility deficits that impair their ability to perform usual daily and recreational functional activities without increase difficulty/symptoms at this time.  Patient to benefit from skilled PT services to address impairments and limitations to improve to previous level of function without restriction secondary to condition.    Personal Factors and Comorbidities Comorbidity 3+    Comorbidities OA, asthma, Lt distal radius fx, deaf, DM with neuropathy, HTN,    Examination-Activity Limitations Sit;Sleep;Bed Mobility;Bend;Squat;Caring for  Others;Carry;Stand;Transfers;Lift;Locomotion Level    Examination-Participation Restrictions Community Activity;Occupation;Meal Prep;Laundry;Interpersonal Relationship;Shop;Cleaning    Stability/Clinical Decision Making Stable/Uncomplicated    Clinical Decision Making Low    Rehab Potential --   fair to good   PT Frequency 2x / week    PT Duration Other (comment)   10 weeks   PT Treatment/Interventions ADLs/Self Care Home Management;Cryotherapy;Electrical Stimulation;Moist Heat;Traction;Balance training;Therapeutic exercise;Therapeutic activities;Iontophoresis '4mg'$ /ml Dexamethasone;Functional mobility training;Stair training;Gait training;DME Instruction;Ultrasound;Neuromuscular re-education;Patient/family education;Passive range of motion;Spinal Manipulations;Joint Manipulations;Dry needling;Taping;Manual techniques    PT Next Visit Plan Lumbar mobility improvements, continue c progressive neural desensitization c graded physical activity return    PT Home Exercise Plan [email or web address]    Consulted and Agree with Plan of Care Patient             Patient will benefit from skilled therapeutic intervention in order to improve the following deficits and impairments:  Decreased endurance, Hypomobility, Pain, Increased fascial restricitons, Decreased activity tolerance, Decreased mobility, Difficulty walking, Increased muscle spasms, Improper body mechanics, Impaired perceived functional ability, Impaired flexibility, Decreased range of motion  Visit Diagnosis: Chronic bilateral low back pain with bilateral sciatica - Plan: PT plan of care cert/re-cert  Pain in left leg - Plan: PT plan of care cert/re-cert  Pain in right leg - Plan: PT plan of care cert/re-cert  Difficulty in walking, not elsewhere classified - Plan: PT plan of care cert/re-cert     Problem List Patient Active Problem List   Diagnosis Date Noted   Hemorrhoids 02/03/2021   Irritable bowel syndrome 11/22/2020   Acute viral  sinusitis 11/12/2020   Bilateral sciatica 10/27/2020   Greater trochanteric bursitis of both hips 10/27/2020   Chronic idiopathic constipation 06/25/2020   Elevated blood sugar 06/10/2020   Hernia of abdominal wall 01/28/2020   Spinal stenosis at L4-L5 level 01/12/2020   Transient neurologic deficit 12/22/2019   Frequent loose stools 04/20/2019   Hyperthyroidism 03/07/2019   Hypertension 01/07/2019   Hypercalcemia 10/08/2018   Seasonal allergies 08/27/2018   Osteoarthritis involving multiple joints on both sides of body 05/25/2018   Statin intolerance 12/26/2017   PSVT (paroxysmal supraventricular tachycardia) ([place]) 11/09/2017   Chest pain 08/30/2017   Hyperlipidemia 08/30/2017   OSA (obstructive sleep apnea) 08/31/2016   Diabetes ([place]) 09/14/2015   Muscle pain 09/28/2014   Seizure ([place]) 06/08/2014   Iron deficiency anemia 07/01/2012   Diabetic retinopathy ([place]) 07/21/2011   GERD 12/30/2007   Diabetic polyneuropathy ([place]) 08/30/2006   Type 2 diabetes mellitus with ophthalmic complication ([place]) [id number]   HYPERCHOLESTEROLEMIA 08/30/2006   Hearing loss 08/30/2006  Scot Jun, PT, DPT, OCS, ATC 02/14/21  8:51 AM    [facility] Physical Therapy [address] [place], [place], [address] Phone: [phone]   Fax:  [phone]  Name: LORYNN ALAVI MRN: [id number] Date of Birth: July 02, 1956

## 2021-02-14 NOTE — Patient Instructions (Signed)
Access Code: [email or web address] URL: [email or web address] Date: 02/14/2021 Prepared by: Scot Jun  Exercises Supine Lower Trunk Rotation - 2 x daily - 7 x weekly - 1 sets - 5 reps - 15 hold Supine Bridge - 2 x daily - 7 x weekly - 3 sets - 10 reps - 2 hold Hooklying Single Knee to Chest Stretch - 2 x daily - 7 x weekly - 1 sets - 5 reps - 15 hold Standing Lumbar Extension - 2 x daily - 7 x weekly - 2-3 sets - 5-10 reps

## 2021-02-16 ENCOUNTER — Other Ambulatory Visit: Payer: Self-pay

## 2021-02-16 ENCOUNTER — Ambulatory Visit (INDEPENDENT_AMBULATORY_CARE_PROVIDER_SITE_OTHER): Payer: Medicare Other | Admitting: Physical Therapy

## 2021-02-16 ENCOUNTER — Other Ambulatory Visit: Payer: Self-pay | Admitting: Family Medicine

## 2021-02-16 ENCOUNTER — Encounter: Payer: Self-pay | Admitting: Physical Therapy

## 2021-02-16 DIAGNOSIS — M79604 Pain in right leg: Secondary | ICD-10-CM

## 2021-02-16 DIAGNOSIS — M5442 Lumbago with sciatica, left side: Secondary | ICD-10-CM

## 2021-02-16 DIAGNOSIS — M5441 Lumbago with sciatica, right side: Secondary | ICD-10-CM

## 2021-02-16 DIAGNOSIS — G8929 Other chronic pain: Secondary | ICD-10-CM

## 2021-02-16 DIAGNOSIS — M79605 Pain in left leg: Secondary | ICD-10-CM | POA: Diagnosis not present

## 2021-02-16 NOTE — Therapy (Signed)
[facility] [address] [place], [place], [address] Phone: [phone]   Fax:  [phone]  Physical Therapy Treatment  Patient Details  Name: Alicia [name] MRN: [id number] Date of Birth: 1956/06/27 Referring Provider (PT): Gregor Hams, MD   Encounter Date: 02/16/2021   PT End of Session - 02/16/21 0758     Visit Number 2    Number of Visits 20    Date for PT Re-Evaluation 04/25/21    Authorization Type Medicare KX at 15    Progress Note Due on Visit 10    PT Start Time 0800    PT Stop Time 0845    PT Time Calculation (min) 45 min    Activity Tolerance Patient limited by pain;Patient tolerated treatment well    Behavior During Therapy [facility] for tasks assessed/performed             Past Medical History:  Diagnosis Date   Anemia    ARTHRITIS, KNEE 09/17/2007   Asthma 04/04/2010   Cataract 01/07/2019   Closed fracture of distal end of left radius 11/01/2015   Complete deafness    Meningitis at age 12   Deaf    Diabetes mellitus    Diabetic neuropathy ([place])    Ganglion cyst 06/22/2011   Gastroparesis    GERD (gastroesophageal reflux disease)    H/O: C-section    Hyperlipidemia    Hypertension    Neuromuscular disorder ([place])    diabetic neuropathy   PSVT (paroxysmal supraventricular tachycardia) ([place]) 11/09/2017   Event monitor 09/14/2017 - Predominantly sinus rhythm with episodes of narrow-complex tachycardia suggestive of paroxysmal supraventricular tachycardia.   S/P appy     Past Surgical History:  Procedure Laterality Date   APPENDECTOMY     cardiolyte EF 77%, no ischemia in 2006  2006   [place] Right 04/20/2015   Procedure: RIGHT CARPAL TUNNEL RELEASE;  Surgeon: Daryll Brod, MD;  Location: [place];  Service: Orthopedics;  Laterality: Right;   CARPAL TUNNEL RELEASE Left 11/04/2015   Procedure: CARPAL TUNNEL RELEASE;  Surgeon: Daryll Brod, MD;  Location: [place];  Service:  Orthopedics;  Laterality: Left;   CESAREAN SECTION     OPEN REDUCTION INTERNAL FIXATION (ORIF) DISTAL RADIAL FRACTURE Left 11/04/2015   Procedure: OPEN REDUCTION INTERNAL FIXATION (ORIF) LEFT DISTAL RADIAL FRACTURE POSSIBLE BONE GRAFT;  Surgeon: Daryll Brod, MD;  Location: [place];  Service: Orthopedics;  Laterality: Left;   TRIGGER FINGER RELEASE Right 04/20/2015   Procedure: RELEASE TRIGGER FINGER/A-1 PULLEY RIGHT MIDDLE FINGER,RIGHT RING FINGER;  Surgeon: Daryll Brod, MD;  Location: [place];  Service: Orthopedics;  Laterality: Right;   TUBAL LIGATION     ULNAR NERVE TRANSPOSITION Right 04/20/2015   Procedure: RIGHT ULNAR NERVE DECOMPRESSION;  Surgeon: Daryll Brod, MD;  Location: [place];  Service: Orthopedics;  Laterality: Right;   UTERINE FIBROID EMBOLIZATION  2007   WRIST SURGERY     Cyst removed on left   WRIST SURGERY Right 1985   tendon repair R wrist    There were no vitals filed for this visit.   Subjective Assessment - 02/16/21 0803     Subjective Patient reports her pain is awful like 9/10. Both thighs hurt very bad    Patient is accompained by: Interpreter    How long can you sit comfortably? 5 mins    How long can you stand comfortably? <30 mins    Patient Stated Goals Reduce  pain    Currently in Pain? Yes    Pain Score 9     Pain Location Back    Pain Orientation Right;Left;Lower    Pain Descriptors / Indicators Sharp;Contraction                               [facility] Adult PT Treatment/Exercise - 02/16/21 0001       Self-Care   Self-Care ADL's    ADL's squats and 1/2 kneel options for working with her kids      Lumbar Exercises: Prone   Other Prone Lumbar Exercises prone lying: pelvic press series 5 x 5 sec, with knee bends x 5 ea, then bil x 5, hip ext x 5 ea, mule kick x 5 ea; chin tucks x 5; scapular squeeze x 5      Modalities   Modalities Electrical Stimulation      Electrical  Stimulation   Electrical Stimulation Location lumbar/glutes    Electrical Stimulation Action IFC    Electrical Stimulation Parameters x 10 min to tolerance    Electrical Stimulation Goals Pain                    PT Education - 02/16/21 [phone]     Education Details HEP progressed; TENS info    Person(s) Educated Patient    Methods Explanation;Demonstration    Comprehension Verbalized understanding;Returned demonstration              PT Short Term Goals - 02/14/21 0758       PT SHORT TERM GOAL #1   Title Patient will demonstrate independent use of home exercise program to maintain progress from in clinic treatments.    Time 3    Period Weeks    Status New    Target Date 03/07/21               PT Long Term Goals - 02/14/21 0759       PT LONG TERM GOAL #1   Title Patient will demonstrate/report pain at worst less than or equal to 2/10 to facilitate minimal limitation in daily activity secondary to pain symptoms.    Time 10    Period Weeks    Status New    Target Date 04/25/21      PT LONG TERM GOAL #2   Title Patient will demonstrate independent use of home exercise program to facilitate ability to maintain/progress functional gains from skilled physical therapy services.    Time 10    Period Weeks    Status New    Target Date 04/25/21      PT LONG TERM GOAL #3   Title Pt. will demonstrate FOTO outcome > or =58% to indicated reduced disability due to condition.    Time 10    Period Weeks    Status New    Target Date 04/25/21      PT LONG TERM GOAL #4   Title Pt. will demonstrate active lumbar extension in standing 100 % WFL s symptoms to facilitate upright standing, walking at PLOF.    Time 10    Period Weeks    Status New    Target Date 04/25/21      PT LONG TERM GOAL #5   Title Pt. will demonstrate lateral flexion to knee joint s symptoms to facilitate bed mobility and bending at PLOF.    Time 10    Period Weeks  Status New    Target  Date 04/25/21                   Plan - 02/16/21 0841     Clinical Impression Statement Patient in significant pain today. We worked in prone the entire time today. She was unable to tolerate prone on elbows initially, but at the end of treatment got into the position with ease to get off the table. Patient advised to spend time in prone or prone on elbows at home and continue wall extension at work. She denied any pain in lower legs at end of session but still has pain in thighs. No significant change in thigh pain. She was able to tolerate prone exercises without increase in sx. Good response to estim. TENs info provided. 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 LTGs ongoing.    Comorbidities OA, asthma, Lt distal radius fx, deaf, DM with neuropathy, HTN,    Examination-Activity Limitations Sit;Sleep;Bed Mobility;Bend;Squat;Caring for Others;Carry;Stand;Transfers;Lift;Locomotion Level    PT Treatment/Interventions ADLs/Self Care Home Management;Cryotherapy;Electrical Stimulation;Moist Heat;Traction;Balance training;Therapeutic exercise;Therapeutic activities;Iontophoresis '4mg'$ /ml Dexamethasone;Functional mobility training;Stair training;Gait training;DME Instruction;Ultrasound;Neuromuscular re-education;Patient/family education;Passive range of motion;Spinal Manipulations;Joint Manipulations;Dry needling;Taping;Manual techniques    PT Next Visit Plan Lumbar mobility improvements, continue c progressive neural desensitization c graded physical activity return    PT Home Exercise Plan [email or web address]    Consulted and Agree with Plan of Care Patient             Patient will benefit from skilled therapeutic intervention in order to improve the following deficits and impairments:  Decreased endurance, Hypomobility, Pain, Increased fascial restricitons, Decreased activity tolerance, Decreased mobility, Difficulty walking,  Increased muscle spasms, Improper body mechanics, Impaired perceived functional ability, Impaired flexibility, Decreased range of motion  Visit Diagnosis: Chronic bilateral low back pain with bilateral sciatica  Pain in left leg  Pain in right leg     Problem List Patient Active Problem List   Diagnosis Date Noted   Hemorrhoids 02/03/2021   Irritable bowel syndrome 11/22/2020   Acute viral sinusitis 11/12/2020   Bilateral sciatica 10/27/2020   Greater trochanteric bursitis of both hips 10/27/2020   Chronic idiopathic constipation 06/25/2020   Elevated blood sugar 06/10/2020   Hernia of abdominal wall 01/28/2020   Spinal stenosis at L4-L5 level 01/12/2020   Transient neurologic deficit 12/22/2019   Frequent loose stools 04/20/2019   Hyperthyroidism 03/07/2019   Hypertension 01/07/2019   Hypercalcemia 10/08/2018   Seasonal allergies 08/27/2018   Osteoarthritis involving multiple joints on both sides of body 05/25/2018   Statin intolerance 12/26/2017   PSVT (paroxysmal supraventricular tachycardia) ([place]) 11/09/2017   Chest pain 08/30/2017   Hyperlipidemia 08/30/2017   OSA (obstructive sleep apnea) 08/31/2016   Diabetes ([place]) 09/14/2015   Muscle pain 09/28/2014   Seizure ([place]) 06/08/2014   Iron deficiency anemia 07/01/2012   Diabetic retinopathy ([place]) 07/21/2011   GERD 12/30/2007   Diabetic polyneuropathy ([place]) 08/30/2006   Type 2 diabetes mellitus with ophthalmic complication ([place]) [id number]   HYPERCHOLESTEROLEMIA 08/30/2006   Hearing loss 08/30/2006    Madelyn Flavors PT 02/16/2021, [place] [address] [place], [place], [address] Phone: [phone]   Fax:  [phone]  Name: Alicia [name] MRN: [id number] Date of Birth: 24-Jul-1955

## 2021-02-16 NOTE — Patient Instructions (Addendum)
Pelvic Press     Place hands under belly between navel and pubic bone, palms up. Feel pressure on hands. Increase pressure on hands by pressing pelvis down. This is NOT a pelvic tilt. Hold __5_ seconds. Relax. Repeat _10__ times. Once a day.  KNEE: Flexion - Prone   Hold pelvic press. Bend knee, then return the foot down. Repeat on opposite leg. Do not raise hips. _10__ reps per set. When this is mastered, pull both heels up at same time, x 10 reps.  Once a day   Leg Lift: One-Leg   Press pelvis down. Keep knee straight; lengthen and lift one leg (from waist). Do not twist body. Keep other leg down. Hold _1__ seconds. Relax. Repeat 10 time. Repeat with other leg.  HIP: Extension / KNEE: Flexion - Prone    Hold pelvic press. Bend knee, squeeze glutes. Raise leg up  10___ reps per set, _1__ sets per day, _1__ time a day.   Axial Extension- Upper body sequence * always start with pelvic press    Lie on stomach with forehead resting on floor and arms at sides. Tuck chin in and raise head from floor without bending it up or down. Repeat ___5_ times per set. Do __1__ sets per session. Do _1___ sessions per day.  Progression:  Arms at side    [facility] [place] [address], [place] [address]  [phone] (office) [phone] (fax)   Access Code: [email or web address] URL: [email or web address] Date: 02/16/2021 Prepared by: Almyra Free Patient Education TENS Unit

## 2021-02-17 NOTE — Progress Notes (Signed)
I, [name], LAT, ATC, am serving as scribe for Dr. Lynne Leader.  Alicia [name] is a 65 y.o. female who presents to [facility] [facility] today for f/u of chronic LBP w/ B LE pain since 2016.  She has previously been seen by neurology/neurosurgery multiple times w/ her most recent visit being on 10/07/20.  She was last seen by Dr. Georgina Snell on 01/11/21 and was referred to PT of which she has completed 2 sessions.  Since her last visit, pt reports back is feeling terrible, worse than when she was seen last. Over the weekend the pain was very bad and pt suffered a fall yesterday at work.  No numbness/tingling noted. Pt reports pain is "crampy" w/ intermittent sharp pains w/ increased pain when transitioning from sitting to standing.  She has quite a bit of pain in the lateral hips bilaterally with pain radiating down the lateral thighs.  Left worse than right.  Diagnostic imaging: L-spine MRI- 12/12/19; L-spine and pelvis XR- 09/10/18   Pertinent review of systems: No fevers or chills  Relevant historical information: Diabetes.  Deaf.   Exam:  BP (!) 150/94   Pulse 85   Ht '5\' 4"'$  (1.626 m)   Wt 136 lb (61.7 kg)   SpO2 98%   BMI 23.34 kg/m  General: Well Developed, well nourished, and in no acute distress.   MSK: L-spine nontender midline decreased lumbar motion. Hips bilaterally normal-appearing normal motion.  Tender palpation greater trochanter hip abduction strength diminished 4/5 with pain.  EXAM: MRI LUMBAR SPINE WITHOUT CONTRAST   TECHNIQUE: Multiplanar, multisequence MR imaging of the lumbar spine was performed. No intravenous contrast was administered.   COMPARISON:  None.   FINDINGS: Segmentation:  Standard.   Alignment:  Grade 1 retrolisthesis at L2-3 and L3-4   Vertebrae:  No fracture, evidence of discitis, or bone lesion.   Conus medullaris and cauda equina: Conus extends to the L1 level. Conus and cauda equina appear normal.   Paraspinal  and other soft tissues: Multiple renal cysts   Disc levels:   T11-12: Normal.   T12-L1: Normal.   L1-L2: Normal disc space and facet joints. There is no spinal canal stenosis. No neural foraminal stenosis.   L2-L3: Intermediate disc bulge. Moderate spinal canal stenosis. Mild left neural foraminal stenosis.   L3-L4: Intermediate disc bulge. Mild facet hypertrophy with ligamentum flavum redundancy. Severe spinal canal stenosis. Moderate right and mild left neural foraminal stenosis.   L4-L5: Small disc bulge and mild facet hypertrophy. Mild spinal canal stenosis. No neural foraminal stenosis.   L5-S1: Normal disc space and facet joints. There is no spinal canal stenosis. No neural foraminal stenosis.   Visualized sacrum: Normal.   IMPRESSION: 1. L3-L4 severe spinal canal stenosis with moderate right and mild left neural foraminal stenosis. 2. L2-L3 moderate spinal canal stenosis and mild left neural foraminal stenosis. 3. L4-L5 mild spinal canal stenosis.     Electronically Signed   By: Ulyses Jarred M.D.   On: 12/13/2019 00:10   I, Lynne Leader, personally (independently) visualized and performed the interpretation of the images attached in this note.    Lab and Radiology Results  X-ray images L-spine and hips bilaterally obtained today personally and independently interpreted  L-spine: Diffuse lumbar DDD.  No acute fractures are visible.  Bilateral hip no severe hip DJD.  No acute fractures.  Question calcified uterine fibroid  Await formal radiology review  Hip greater trochanteric injection: left Consent obtained and  timeout performed. Area of maximum tenderness palpated and identified. Skin cleaned with alcohol, cold spray applied. A 22g needle was used to access the greater trochanteric bursa. '40mg'$  of Kenalog and 2 mL of Marcaine were used to inject the trochanteric bursa. Patient tolerated the procedure well.    Assessment and Plan: 65 y.o. female  with bilateral hip pain due to greater trochanteric bursitis and hip abductor tendinopathy.  Plan for left greater trochanter injection today and return in 1 week for right greater trochanter injection.  Emphasized physical therapy for this issue and informed PT of diagnosis with new order.  Additionally patient has back pain that to be due to an significant back DDD and facet DJD.  If back pain not improved following bilateral hip injections and physical therapy would consider facet medial branch block and ablation likely bilateral L3-4 working down from there.   PDMP not reviewed this encounter. Orders Placed This Encounter  Procedures   DG Lumbar Spine 2-3 Views    Standing Status:   Future    Number of Occurrences:   1    Standing Expiration Date:   02/22/2022    Order Specific Question:   Reason for Exam (SYMPTOM  OR DIAGNOSIS REQUIRED)    Answer:   eval low back pain    Order Specific Question:   Preferred imaging location?    Answer:   [facility]   Ambulatory referral to Physical Therapy    Referral Priority:   Routine    Referral Type:   Physical Medicine    Referral Reason:   Specialty Services Required    Requested Specialty:   Physical Therapy    Number of Visits Requested:   1   No orders of the defined types were placed in this encounter.    Discussed warning signs or symptoms. Please see discharge instructions. Patient expresses understanding.   The above documentation has been reviewed and is accurate and complete Lynne Leader, M.D.  American sign language interpreter used for today's visit

## 2021-02-21 ENCOUNTER — Encounter: Payer: Medicare Other | Admitting: Physical Therapy

## 2021-02-22 ENCOUNTER — Ambulatory Visit (INDEPENDENT_AMBULATORY_CARE_PROVIDER_SITE_OTHER): Payer: Medicare Other

## 2021-02-22 ENCOUNTER — Ambulatory Visit (INDEPENDENT_AMBULATORY_CARE_PROVIDER_SITE_OTHER): Payer: Medicare Other | Admitting: Family Medicine

## 2021-02-22 ENCOUNTER — Other Ambulatory Visit: Payer: Self-pay | Admitting: Family Medicine

## 2021-02-22 ENCOUNTER — Other Ambulatory Visit: Payer: Self-pay

## 2021-02-22 VITALS — BP 150/94 | HR 85 | Ht 64.0 in | Wt 136.0 lb

## 2021-02-22 DIAGNOSIS — G8929 Other chronic pain: Secondary | ICD-10-CM

## 2021-02-22 DIAGNOSIS — M7061 Trochanteric bursitis, right hip: Secondary | ICD-10-CM | POA: Diagnosis not present

## 2021-02-22 DIAGNOSIS — M5442 Lumbago with sciatica, left side: Secondary | ICD-10-CM

## 2021-02-22 DIAGNOSIS — M7062 Trochanteric bursitis, left hip: Secondary | ICD-10-CM

## 2021-02-22 DIAGNOSIS — M25552 Pain in left hip: Secondary | ICD-10-CM | POA: Diagnosis not present

## 2021-02-22 DIAGNOSIS — M5441 Lumbago with sciatica, right side: Secondary | ICD-10-CM

## 2021-02-22 DIAGNOSIS — M545 Low back pain, unspecified: Secondary | ICD-10-CM | POA: Diagnosis not present

## 2021-02-22 NOTE — Patient Instructions (Addendum)
Thank you for coming in today.   Call or go to the ER if you develop a large red swollen joint with extreme pain or oozing puss.    Schedule right hip injection in 1 week.   Please get an Xray today before you leave   Hip Bursitis  Hip bursitis is swelling of one or more fluid-filled sacs (bursae) in your hip joint. This condition can cause pain, and your symptoms may comeand go over time. What are the causes? Repeated use of your hip muscles. Injury to the hip. Weak butt muscles. Bone spurs. Infection. In some cases, the cause may not be known. What increases the risk? You are more likely to develop this condition if: You had a past hip injury or hip surgery. You have a condition, such as arthritis, gout, diabetes, or thyroid disease. You have spine problems. You have one leg that is shorter than the other. You run a lot or do long-distance running. You play sports where there is a risk of injury or falling, such as football, martial arts, or skiing. What are the signs or symptoms? Symptoms may come and go, and they often include: Pain in the hip or groin area. Pain may get worse when you move your hip. Tenderness and swelling of the hip. In rare cases, the bursa may become infected. If this happens, you may get afever, as well as have warmth and redness in the hip area. How is this treated? This condition is treated by: Resting your hip. Icing your hip. Wrapping the hip area with an elastic bandage (compression wrap). Keeping the hip raised. Other treatments may include medicine, draining fluid out of the bursa, orusing crutches, a cane, or a walker. Surgery may be needed, but this is rare. Long-term treatment may include doing exercises to help your strength and flexibility. It may also include lifestyle changes like losing weight to lessenthe strain on your hip. Follow these instructions at home: Managing pain, stiffness, and swelling     If told, put ice on the  painful area. Put ice in a plastic bag. Place a towel between your skin and the bag. Leave the ice on for 20 minutes, 2-3 times a day. Raise your hip by putting a pillow under your hips while you lie down. Stop if you feel pain. If told, put heat on the affected area. Do this as often as told by your doctor. Use a moist heat pack or a heating pad as told by your doctor. Place a towel between your skin and the heat source. Leave the heat on for 20-30 minutes. Take off the heat if your skin turns bright red. This is very important if you are unable to feel pain, heat, or cold. You may have a greater risk of getting burned. Activity Do not use your hip to support your body weight until your doctor says that you can. Use crutches, a cane, or a walker as told by your doctor. If the affected leg is one that you use to drive, ask your doctor if it is safe to drive. Rest and protect your hip as much as you can until you feel better. Return to your normal activities as told by your doctor. Ask your doctor what activities are safe for you. Do exercises as told by your doctor. General instructions Take over-the-counter and prescription medicines only as told by your doctor. Gently rub and stretch your injured area as often as is comfortable. Wear elastic bandages only as told  by your doctor. If one of your legs is shorter than the other, get fitted for a shoe insert or orthotic. Keep a healthy weight. Follow instructions from your doctor. Keep all follow-up visits as told by your doctor. This is important. How is this prevented? Exercise regularly, as told by your doctor. Wear the right shoes for the sport you play. Warm up and stretch before being active. Cool down and stretch after being active. Take breaks often from repeated activity. Avoid activities that bother your hip or cause pain. Avoid sitting down for a long time. Where to find more information American Academy of Orthopaedic  Surgeons: orthoinfo.aaos.org Contact a doctor if: You have a fever. You have new symptoms. You have trouble walking or doing everyday activities. You have pain that gets worse or does not get better with medicine. Your skin around your hip is red. You get a feeling of warmth in your hip area. Get help right away if: You cannot move your hip. You have very bad pain. You cannot control the muscles in your feet. Summary Hip bursitis is swelling of one or more fluid-filled sacs (bursae) in your hip joint. Symptoms often come and go over time. This condition is often treated by resting and icing the hip. It also may help to keep the area raised and wrapped in an elastic bandage. Other treatments may be needed. This information is not intended to replace advice given to you by your health care provider. Make sure you discuss any questions you have with your healthcare provider. Document Revised: 04/21/2019 Document Reviewed: 02/25/2018 Elsevier Patient Education  2022 [employer].

## 2021-02-23 ENCOUNTER — Encounter: Payer: Self-pay | Admitting: Rehabilitative and Restorative Service Providers"

## 2021-02-23 ENCOUNTER — Ambulatory Visit (INDEPENDENT_AMBULATORY_CARE_PROVIDER_SITE_OTHER): Payer: Medicare Other | Admitting: Rehabilitative and Restorative Service Providers"

## 2021-02-23 DIAGNOSIS — M5442 Lumbago with sciatica, left side: Secondary | ICD-10-CM | POA: Diagnosis not present

## 2021-02-23 DIAGNOSIS — M79605 Pain in left leg: Secondary | ICD-10-CM

## 2021-02-23 DIAGNOSIS — M79604 Pain in right leg: Secondary | ICD-10-CM

## 2021-02-23 DIAGNOSIS — R262 Difficulty in walking, not elsewhere classified: Secondary | ICD-10-CM

## 2021-02-23 DIAGNOSIS — G8929 Other chronic pain: Secondary | ICD-10-CM

## 2021-02-23 DIAGNOSIS — M5441 Lumbago with sciatica, right side: Secondary | ICD-10-CM | POA: Diagnosis not present

## 2021-02-23 NOTE — Progress Notes (Signed)
Xray lumbar spine shows multi-level arthritis changes.  We can also see a possible kidney stone and a calcified uterine fiberoid. You dont need to do anything about those right now.

## 2021-02-23 NOTE — Progress Notes (Signed)
Bilateral hip Xray shoes mild arthritis.

## 2021-02-23 NOTE — Patient Instructions (Signed)
Access Code: [email or web address] URL: [email or web address] Date: 02/23/2021 Prepared by: Scot Jun  Exercises Supine Lower Trunk Rotation - 2 x daily - 7 x weekly - 1 sets - 5 reps - 15 hold Supine Bridge - 2 x daily - 7 x weekly - 3 sets - 10 reps - 2 hold Hooklying Single Knee to Chest Stretch - 2 x daily - 7 x weekly - 1 sets - 5 reps - 15 hold Supine Double Knee to Chest - 2 x daily - 7 x weekly - 1 sets - 5 reps - 15 hold Clamshell - 2 x daily - 7 x weekly - 3 sets - 10 reps

## 2021-02-23 NOTE — Therapy (Signed)
[facility] [address] [place], [place], [address] Phone: [phone]   Fax:  [phone]  Physical Therapy Treatment  Patient Details  Name: Alicia [name] MRN: [id number] Date of Birth: [birth year] Referring Provider (PT): Gregor Hams, MD   Encounter Date: 02/23/2021   PT End of Session - 02/23/21 0756     Visit Number 3    Number of Visits 20    Date for PT Re-Evaluation 04/25/21    Authorization Type Medicare KX at 15    Progress Note Due on Visit 10    PT Start Time 0758    PT Stop Time 0842    PT Time Calculation (min) 44 min    Activity Tolerance Patient tolerated treatment well    Behavior During Therapy [facility] for tasks assessed/performed             Past Medical History:  Diagnosis Date   Anemia    ARTHRITIS, KNEE 09/17/2007   Asthma 04/04/2010   Cataract 01/07/2019   Closed fracture of distal end of left radius 11/01/2015   Complete deafness    Meningitis at age 34   Deaf    Diabetes mellitus    Diabetic neuropathy ([place])    Ganglion cyst 06/22/2011   Gastroparesis    GERD (gastroesophageal reflux disease)    H/O: C-section    Hyperlipidemia    Hypertension    Neuromuscular disorder ([place])    diabetic neuropathy   PSVT (paroxysmal supraventricular tachycardia) ([place]) 11/09/2017   Event monitor 09/14/2017 - Predominantly sinus rhythm with episodes of narrow-complex tachycardia suggestive of paroxysmal supraventricular tachycardia.   S/P appy     Past Surgical History:  Procedure Laterality Date   APPENDECTOMY     cardiolyte EF 77%, no ischemia in 2006  2006   [place] Right 04/20/2015   Procedure: RIGHT CARPAL TUNNEL RELEASE;  Surgeon: Daryll Brod, MD;  Location: [place];  Service: Orthopedics;  Laterality: Right;   CARPAL TUNNEL RELEASE Left 11/04/2015   Procedure: CARPAL TUNNEL RELEASE;  Surgeon: Daryll Brod, MD;  Location: [place];  Service: Orthopedics;  Laterality:  Left;   CESAREAN SECTION     OPEN REDUCTION INTERNAL FIXATION (ORIF) DISTAL RADIAL FRACTURE Left 11/04/2015   Procedure: OPEN REDUCTION INTERNAL FIXATION (ORIF) LEFT DISTAL RADIAL FRACTURE POSSIBLE BONE GRAFT;  Surgeon: Daryll Brod, MD;  Location: [place];  Service: Orthopedics;  Laterality: Left;   TRIGGER FINGER RELEASE Right 04/20/2015   Procedure: RELEASE TRIGGER FINGER/A-1 PULLEY RIGHT MIDDLE FINGER,RIGHT RING FINGER;  Surgeon: Daryll Brod, MD;  Location: [place];  Service: Orthopedics;  Laterality: Right;   TUBAL LIGATION     ULNAR NERVE TRANSPOSITION Right 04/20/2015   Procedure: RIGHT ULNAR NERVE DECOMPRESSION;  Surgeon: Daryll Brod, MD;  Location: [place];  Service: Orthopedics;  Laterality: Right;   UTERINE FIBROID EMBOLIZATION  2007   WRIST SURGERY     Cyst removed on left   WRIST SURGERY Right 1985   tendon repair R wrist    There were no vitals filed for this visit.   Subjective Assessment - 02/23/21 0757     Subjective Pt. stated feeling ok today.  Pt. indicated hip injection in Lt hip and will get injection in Rt hip and back later.  Pt. stated she though the Lt hip felt a little better since the injection.  Pt. indicated walking was a bit better.  Pt. indicated back pain has seemed  to get worse over the last week.    Patient is accompained by: Interpreter    Diagnostic tests MRI indicated spinal canal stenosis mid lumbar    Patient Stated Goals Reduce pain    Currently in Pain? Yes    Pain Score 4     Pain Location Back    Pain Orientation Right;Left;Lower    Pain Descriptors / Indicators Sharp;Tightness    Pain Type Chronic pain    Pain Radiating Towards bilateral hip/thighs    Pain Onset More than a month ago    Pain Frequency Constant    Aggravating Factors  lying, standing from sitting    Pain Relieving Factors injection helped some on the Lt                [facility] PT Assessment - 02/23/21 0001        Assessment   Medical Diagnosis M54.41,M54.42,G89.29 (ICD-10-CM) - Chronic left-sided low back pain with bilateral sciatica    Referring Provider (PT) Gregor Hams, MD      AROM   Lumbar Flexion mid shin, no complaints    Lumbar Extension 75% WFL, ERP lumbar, Leg symptoms      Strength   Right Hip ABduction 3+/5    Left Hip ABduction 3+/5      Palpation   Palpation comment Glute med/min/max trigger points lateral, posterior lateral hip bilateral                           [facility] Adult PT Treatment/Exercise - 02/23/21 0001       Lumbar Exercises: Stretches   Single Knee to Chest Stretch 1 rep;Left;Right   15 sec hold   Double Knee to Chest Stretch 5 reps   15 sec hold   Lower Trunk Rotation 3 reps   15 sec hold each side     Lumbar Exercises: Aerobic   Nustep Lvl 5 6 mins UE/LE      Lumbar Exercises: Sidelying   Clam Both   2 x 10     Lumbar Exercises: Prone   Other Prone Lumbar Exercises held all prone execises to prevent worsening c repeated extension movement                    PT Education - 02/23/21 0835     Education Details Adjusted HEP to focus on flexion movements for centralizations and symptom relief    Person(s) Educated Patient    Methods Explanation;Demonstration;Verbal cues;Handout    Comprehension Verbalized understanding;Returned demonstration              PT Short Term Goals - 02/23/21 0805       PT SHORT TERM GOAL #1   Title Patient will demonstrate independent use of home exercise program to maintain progress from in clinic treatments.    Time 3    Period Weeks    Status On-going    Target Date 03/07/21               PT Long Term Goals - 02/14/21 0759       PT LONG TERM GOAL #1   Title Patient will demonstrate/report pain at worst less than or equal to 2/10 to facilitate minimal limitation in daily activity secondary to pain symptoms.    Time 10    Period Weeks    Status New    Target Date 04/25/21       PT LONG TERM GOAL #  2   Title Patient will demonstrate independent use of home exercise program to facilitate ability to maintain/progress functional gains from skilled physical therapy services.    Time 10    Period Weeks    Status New    Target Date 04/25/21      PT LONG TERM GOAL #3   Title Pt. will demonstrate FOTO outcome > or =58% to indicated reduced disability due to condition.    Time 10    Period Weeks    Status New    Target Date 04/25/21      PT LONG TERM GOAL #4   Title Pt. will demonstrate active lumbar extension in standing 100 % WFL s symptoms to facilitate upright standing, walking at PLOF.    Time 10    Period Weeks    Status New    Target Date 04/25/21      PT LONG TERM GOAL #5   Title Pt. will demonstrate lateral flexion to knee joint s symptoms to facilitate bed mobility and bending at PLOF.    Time 10    Period Weeks    Status New    Target Date 04/25/21                   Plan - 02/23/21 [id number]     Clinical Impression Statement Review of existing MRI (spinal canal stenosis in mid lumbar) as well as patient response to initial treatment movements and results of reassessment in movement today indicated plan shift to focus on repeated movements into flexion in supine to decrease spinal canal pressure in efforts to reduce severity of symptoms at this time. Hold on all extension based movements at this time.  Performance of flexion movements today reduced arrival symptoms severity.    Comorbidities OA, asthma, Lt distal radius fx, deaf, DM with neuropathy, HTN,    Examination-Activity Limitations Sit;Sleep;Bed Mobility;Bend;Squat;Caring for Others;Carry;Stand;Transfers;Lift;Locomotion Level    PT Frequency 2x / week    PT Duration Other (comment)    PT Treatment/Interventions ADLs/Self Care Home Management;Cryotherapy;Electrical Stimulation;Moist Heat;Traction;Balance training;Therapeutic exercise;Therapeutic activities;Iontophoresis '4mg'$ /ml  Dexamethasone;Functional mobility training;Stair training;Gait training;DME Instruction;Ultrasound;Neuromuscular re-education;Patient/family education;Passive range of motion;Spinal Manipulations;Joint Manipulations;Dry needling;Taping;Manual techniques    PT Next Visit Plan Follow up on flexion based symptom relief intervention.  Progress hip strength, possible Trigger point release techniques for lateral hip.    PT Home Exercise Plan [email or web address]    Consulted and Agree with Plan of Care Patient             Patient will benefit from skilled therapeutic intervention in order to improve the following deficits and impairments:  Decreased endurance, Hypomobility, Pain, Increased fascial restricitons, Decreased activity tolerance, Decreased mobility, Difficulty walking, Increased muscle spasms, Improper body mechanics, Impaired perceived functional ability, Impaired flexibility, Decreased range of motion  Visit Diagnosis: Chronic bilateral low back pain with bilateral sciatica  Pain in left leg  Pain in right leg  Difficulty in walking, not elsewhere classified     Problem List Patient Active Problem List   Diagnosis Date Noted   Hemorrhoids 02/03/2021   Irritable bowel syndrome 11/22/2020   Acute viral sinusitis 11/12/2020   Bilateral sciatica 10/27/2020   Greater trochanteric bursitis of both hips 10/27/2020   Chronic idiopathic constipation 06/25/2020   Elevated blood sugar 06/10/2020   Hernia of abdominal wall 01/28/2020   Spinal stenosis at L4-L5 level 01/12/2020   Transient neurologic deficit 12/22/2019   Frequent loose stools 04/20/2019   Hyperthyroidism 03/07/2019   Hypertension 01/07/2019  Hypercalcemia 10/08/2018   Seasonal allergies 08/27/2018   Osteoarthritis involving multiple joints on both sides of body 05/25/2018   Statin intolerance 12/26/2017   PSVT (paroxysmal supraventricular tachycardia) ([place]) 11/09/2017   Chest pain 08/30/2017   Hyperlipidemia 08/30/2017    OSA (obstructive sleep apnea) 08/31/2016   Diabetes ([place]) 09/14/2015   Muscle pain 09/28/2014   Seizure ([place]) 06/08/2014   Iron deficiency anemia 07/01/2012   Diabetic retinopathy ([place]) 07/21/2011   GERD 12/30/2007   Diabetic polyneuropathy ([place]) 08/30/2006   Type 2 diabetes mellitus with ophthalmic complication ([place]) [id number]   HYPERCHOLESTEROLEMIA 08/30/2006   Hearing loss 08/30/2006   Scot Jun, PT, DPT, OCS, ATC 02/23/21  8:52 AM    [facility] [address] [place], [place], [address] Phone: [phone]   Fax:  [phone]  Name: Alicia [name] MRN: [id number] Date of Birth: 03-14-1956

## 2021-02-28 NOTE — Progress Notes (Signed)
   I, [name], LAT, ATC, am serving as scribe for Dr. Lynne Leader.  Alicia [name] is a 65 y.o. female who presents to [place] at [facility] today for f/u of chronic LBP and B lateral hip pain thought to be due to greater trochanteric bursitis.  She was last seen by Dr. Georgina Snell on 02/22/21 w/ worsening LBP and had a L GT injection.  She was advised to con't PT and has completed a total of 3 sessions.  Today, pt reports much relief from from L GT injection. Pt reports R hip aches and is painful. Pt locates pain to the lateral aspect of R hip and deep within the joint.  Diagnostic testing: B hip and L-spine XR- 02/22/21; L-spine MRI- 12/12/19   Pertinent review of systems: No fevers or chills  Relevant historical information: Type 2 diabetes    Exam:  BP 124/76   Pulse 94   Ht '5\' 4"'$  (1.626 m)   Wt 130 lb 3.2 oz (59.1 kg)   SpO2 99%   BMI 22.35 kg/m  General: Well Developed, well nourished, and in no acute distress.   MSK: Right hip normal-appearing tender palpation greater trochanter.  Reduced strength with hip abduction    Lab and Radiology Results Hip greater trochanteric injection: right Consent obtained and timeout performed. Area of maximum tenderness palpated and identified. Skin cleaned with alcohol, cold spray applied. A 22g needle was used to access the greater trochanteric bursa. '40mg'$  of Kenalog and 2 mL of Marcaine were used to inject the trochanteric bursa. Patient tolerated the procedure well.      Assessment and Plan: 65 y.o. female with right lateral hip pain due to greater trochanteric bursitis and hip abductor tendinopathy.  Plan for right-sided greater trochanter injection today.  Patient is already engaged with physical therapy.  Plan to continue PT for this issue and the low back pain previously identified. Recheck back as needed with myself for this and back pain going forward if not improved.   PDMP not reviewed this  encounter. No orders of the defined types were placed in this encounter.  No orders of the defined types were placed in this encounter.    Discussed warning signs or symptoms. Please see discharge instructions. Patient expresses understanding.   The above documentation has been reviewed and is accurate and complete Lynne Leader, M.D.  American sign language interpreter used today Total encounter time 15 minutes including face-to-face time with the patient and, reviewing past medical record, and charting on the date of service.   Time does not include time to perform the injection.

## 2021-03-01 ENCOUNTER — Ambulatory Visit (INDEPENDENT_AMBULATORY_CARE_PROVIDER_SITE_OTHER): Payer: Medicare Other | Admitting: Family Medicine

## 2021-03-01 ENCOUNTER — Ambulatory Visit: Payer: Self-pay

## 2021-03-01 ENCOUNTER — Other Ambulatory Visit: Payer: Self-pay

## 2021-03-01 VITALS — BP 124/76 | HR 94 | Ht 64.0 in | Wt 130.2 lb

## 2021-03-01 DIAGNOSIS — M7061 Trochanteric bursitis, right hip: Secondary | ICD-10-CM | POA: Diagnosis not present

## 2021-03-01 DIAGNOSIS — M7062 Trochanteric bursitis, left hip: Secondary | ICD-10-CM | POA: Diagnosis not present

## 2021-03-01 NOTE — Patient Instructions (Addendum)
Thank you for coming in today.   Call or go to the ER if you develop a large red swollen joint with extreme pain or oozing puss.    Continue the home exercises and physical therapy.    Return as needed.

## 2021-03-02 ENCOUNTER — Encounter: Payer: Self-pay | Admitting: Rehabilitative and Restorative Service Providers"

## 2021-03-02 ENCOUNTER — Ambulatory Visit (INDEPENDENT_AMBULATORY_CARE_PROVIDER_SITE_OTHER): Payer: Medicare Other | Admitting: Rehabilitative and Restorative Service Providers"

## 2021-03-02 DIAGNOSIS — M5441 Lumbago with sciatica, right side: Secondary | ICD-10-CM | POA: Diagnosis not present

## 2021-03-02 DIAGNOSIS — M79605 Pain in left leg: Secondary | ICD-10-CM | POA: Diagnosis not present

## 2021-03-02 DIAGNOSIS — R262 Difficulty in walking, not elsewhere classified: Secondary | ICD-10-CM | POA: Diagnosis not present

## 2021-03-02 DIAGNOSIS — M79604 Pain in right leg: Secondary | ICD-10-CM | POA: Diagnosis not present

## 2021-03-02 DIAGNOSIS — G8929 Other chronic pain: Secondary | ICD-10-CM

## 2021-03-02 DIAGNOSIS — M5442 Lumbago with sciatica, left side: Secondary | ICD-10-CM

## 2021-03-02 NOTE — Therapy (Signed)
[facility] Physical Therapy [address] [place], [place], [address] Phone: [phone]   Fax:  [phone]  Physical Therapy Treatment  Patient Details  Name: Alicia [name] MRN: [id number] Date of Birth: 08-20-1955 Referring Provider (PT): Gregor Hams, MD   Encounter Date: 03/02/2021   PT End of Session - 03/02/21 0811     Visit Number 4    Number of Visits 20    Date for PT Re-Evaluation 04/25/21    Authorization Type Medicare KX at 15    Progress Note Due on Visit 10    PT Start Time 0803    PT Stop Time 0842    PT Time Calculation (min) 39 min    Activity Tolerance Patient tolerated treatment well    Behavior During Therapy [facility] for tasks assessed/performed             Past Medical History:  Diagnosis Date   Anemia    ARTHRITIS, KNEE 09/17/2007   Asthma 04/04/2010   Cataract 01/07/2019   Closed fracture of distal end of left radius 11/01/2015   Complete deafness    Meningitis at age 49   Deaf    Diabetes mellitus    Diabetic neuropathy ([place])    Ganglion cyst 06/22/2011   Gastroparesis    GERD (gastroesophageal reflux disease)    H/O: C-section    Hyperlipidemia    Hypertension    Neuromuscular disorder ([place])    diabetic neuropathy   PSVT (paroxysmal supraventricular tachycardia) ([place]) 11/09/2017   Event monitor 09/14/2017 - Predominantly sinus rhythm with episodes of narrow-complex tachycardia suggestive of paroxysmal supraventricular tachycardia.   S/P appy     Past Surgical History:  Procedure Laterality Date   APPENDECTOMY     cardiolyte EF 77%, no ischemia in 2006  2006   [place] Right 04/20/2015   Procedure: RIGHT CARPAL TUNNEL RELEASE;  Surgeon: Daryll Brod, MD;  Location: [place];  Service: Orthopedics;  Laterality: Right;   CARPAL TUNNEL RELEASE Left 11/04/2015   Procedure: CARPAL TUNNEL RELEASE;  Surgeon: Daryll Brod, MD;  Location: [place];  Service: Orthopedics;  Laterality:  Left;   CESAREAN SECTION     OPEN REDUCTION INTERNAL FIXATION (ORIF) DISTAL RADIAL FRACTURE Left 11/04/2015   Procedure: OPEN REDUCTION INTERNAL FIXATION (ORIF) LEFT DISTAL RADIAL FRACTURE POSSIBLE BONE GRAFT;  Surgeon: Daryll Brod, MD;  Location: [place];  Service: Orthopedics;  Laterality: Left;   TRIGGER FINGER RELEASE Right 04/20/2015   Procedure: RELEASE TRIGGER FINGER/A-1 PULLEY RIGHT MIDDLE FINGER,RIGHT RING FINGER;  Surgeon: Daryll Brod, MD;  Location: [place];  Service: Orthopedics;  Laterality: Right;   TUBAL LIGATION     ULNAR NERVE TRANSPOSITION Right 04/20/2015   Procedure: RIGHT ULNAR NERVE DECOMPRESSION;  Surgeon: Daryll Brod, MD;  Location: [place];  Service: Orthopedics;  Laterality: Right;   UTERINE FIBROID EMBOLIZATION  2007   WRIST SURGERY     Cyst removed on left   WRIST SURGERY Right 1985   tendon repair R wrist    There were no vitals filed for this visit.   Subjective Assessment - 03/02/21 0810     Subjective Pt. indicated she had some digestive troubles over weekend that limited her use of HEP.  Pt. stated Lt hip and back seem to be doing better in waking up.  Injection in Rt hip yesterday was helpful per Pt. report.  Denied having pain upon arrival today.    Patient is  accompained by: Interpreter    Diagnostic tests MRI indicated spinal canal stenosis mid lumbar    Patient Stated Goals Reduce pain    Currently in Pain? No/denies    Pain Score 0-No pain    Pain Onset More than a month ago                               [facility] Adult PT Treatment/Exercise - 03/02/21 0001       Lumbar Exercises: Stretches   Double Knee to Chest Stretch 5 reps   15 seconds   Lower Trunk Rotation 3 reps   15 sec x 3 bilateral     Lumbar Exercises: Aerobic   Nustep Lvl 6 5 mins, lvl 5 3 mins UE/LE(brief rest at 5 mins)      Lumbar Exercises: Seated   Sit to Stand 10 reps   18 inch table without UE      Lumbar Exercises: Supine   Bridge 20 reps      Lumbar Exercises: Sidelying   Clam Both   3 x 10                     PT Short Term Goals - 02/23/21 0805       PT SHORT TERM GOAL #1   Title Patient will demonstrate independent use of home exercise program to maintain progress from in clinic treatments.    Time 3    Period Weeks    Status On-going    Target Date 03/07/21               PT Long Term Goals - 02/14/21 0759       PT LONG TERM GOAL #1   Title Patient will demonstrate/report pain at worst less than or equal to 2/10 to facilitate minimal limitation in daily activity secondary to pain symptoms.    Time 10    Period Weeks    Status New    Target Date 04/25/21      PT LONG TERM GOAL #2   Title Patient will demonstrate independent use of home exercise program to facilitate ability to maintain/progress functional gains from skilled physical therapy services.    Time 10    Period Weeks    Status New    Target Date 04/25/21      PT LONG TERM GOAL #3   Title Pt. will demonstrate FOTO outcome > or =58% to indicated reduced disability due to condition.    Time 10    Period Weeks    Status New    Target Date 04/25/21      PT LONG TERM GOAL #4   Title Pt. will demonstrate active lumbar extension in standing 100 % WFL s symptoms to facilitate upright standing, walking at PLOF.    Time 10    Period Weeks    Status New    Target Date 04/25/21      PT LONG TERM GOAL #5   Title Pt. will demonstrate lateral flexion to knee joint s symptoms to facilitate bed mobility and bending at PLOF.    Time 10    Period Weeks    Status New    Target Date 04/25/21                   Plan - 03/02/21 0820     Clinical Impression Statement Short term indications of benefits from injections and flexion  based movements in HEP.  Noted generalized fatigue in activity.  Pt. indicated she thought weight loss over last few years and stomach illness from weekend  played a role in fatigue today.  Continued skilled PT services encouraged to promote continued gains in mobility and symptoms as well as improving endurance.    Comorbidities OA, asthma, Lt distal radius fx, deaf, DM with neuropathy, HTN,    Examination-Activity Limitations Sit;Sleep;Bed Mobility;Bend;Squat;Caring for Others;Carry;Stand;Transfers;Lift;Locomotion Level    PT Frequency 2x / week    PT Duration Other (comment)    PT Treatment/Interventions ADLs/Self Care Home Management;Cryotherapy;Electrical Stimulation;Moist Heat;Traction;Balance training;Therapeutic exercise;Therapeutic activities;Iontophoresis '4mg'$ /ml Dexamethasone;Functional mobility training;Stair training;Gait training;DME Instruction;Ultrasound;Neuromuscular re-education;Patient/family education;Passive range of motion;Spinal Manipulations;Joint Manipulations;Dry needling;Taping;Manual techniques    PT Next Visit Plan Continue use of flexion based symptom relief direcitonal preference  Progress hip strength and overall endurance    PT Home Exercise Plan [id number]    Consulted and Agree with Plan of Care Patient             Patient will benefit from skilled therapeutic intervention in order to improve the following deficits and impairments:  Decreased endurance, Hypomobility, Pain, Increased fascial restricitons, Decreased activity tolerance, Decreased mobility, Difficulty walking, Increased muscle spasms, Improper body mechanics, Impaired perceived functional ability, Impaired flexibility, Decreased range of motion  Visit Diagnosis: Chronic bilateral low back pain with bilateral sciatica  Pain in left leg  Pain in right leg  Difficulty in walking, not elsewhere classified     Problem List Patient Active Problem List   Diagnosis Date Noted   Hemorrhoids 02/03/2021   Irritable bowel syndrome 11/22/2020   Acute viral sinusitis 11/12/2020   Bilateral sciatica 10/27/2020   Greater trochanteric bursitis of both hips  10/27/2020   Chronic idiopathic constipation 06/25/2020   Elevated blood sugar 06/10/2020   Hernia of abdominal wall 01/28/2020   Spinal stenosis at L4-L5 level 01/12/2020   Transient neurologic deficit 12/22/2019   Frequent loose stools 04/20/2019   Hyperthyroidism 03/07/2019   Hypertension 01/07/2019   Hypercalcemia 10/08/2018   Seasonal allergies 08/27/2018   Osteoarthritis involving multiple joints on both sides of body 05/25/2018   Statin intolerance 12/26/2017   PSVT (paroxysmal supraventricular tachycardia) ([place]) 11/09/2017   Chest pain 08/30/2017   Hyperlipidemia 08/30/2017   OSA (obstructive sleep apnea) 08/31/2016   Diabetes ([place]) 09/14/2015   Muscle pain 09/28/2014   Seizure ([place]) 06/08/2014   Iron deficiency anemia 07/01/2012   Diabetic retinopathy ([place]) 07/21/2011   GERD 12/30/2007   Diabetic polyneuropathy ([place]) 08/30/2006   Type 2 diabetes mellitus with ophthalmic complication ([place]) [id number]   HYPERCHOLESTEROLEMIA 08/30/2006   Hearing loss 08/30/2006    Scot Jun, PT, DPT, OCS, ATC 03/02/21  8:39 AM    [facility] Physical Therapy [address] [place], [place], [address] Phone: [phone]   Fax:  [phone]  Name: Alicia [name] MRN: [id number] Date of Birth: 1955-10-17

## 2021-03-04 ENCOUNTER — Ambulatory Visit: Payer: Medicare Other | Admitting: Family Medicine

## 2021-03-09 ENCOUNTER — Ambulatory Visit (INDEPENDENT_AMBULATORY_CARE_PROVIDER_SITE_OTHER): Payer: Medicare Other | Admitting: Physical Therapy

## 2021-03-09 ENCOUNTER — Other Ambulatory Visit: Payer: Self-pay

## 2021-03-09 ENCOUNTER — Encounter: Payer: Self-pay | Admitting: Physical Therapy

## 2021-03-09 DIAGNOSIS — M79605 Pain in left leg: Secondary | ICD-10-CM

## 2021-03-09 DIAGNOSIS — M5442 Lumbago with sciatica, left side: Secondary | ICD-10-CM | POA: Diagnosis not present

## 2021-03-09 DIAGNOSIS — M5441 Lumbago with sciatica, right side: Secondary | ICD-10-CM | POA: Diagnosis not present

## 2021-03-09 DIAGNOSIS — R262 Difficulty in walking, not elsewhere classified: Secondary | ICD-10-CM | POA: Diagnosis not present

## 2021-03-09 DIAGNOSIS — G8929 Other chronic pain: Secondary | ICD-10-CM

## 2021-03-09 DIAGNOSIS — M79604 Pain in right leg: Secondary | ICD-10-CM

## 2021-03-09 NOTE — Therapy (Signed)
[facility] Physical Therapy [address] [place], [place], [address] Phone: [phone]   Fax:  [phone]  Physical Therapy Treatment  Patient Details  Name: Alicia [name] MRN: [id number] Date of Birth: 16-Jan-1956 Referring Provider (PT): Gregor Hams, MD   Encounter Date: 03/09/2021   PT End of Session - 03/09/21 0811     Visit Number 5    Number of Visits 20    Date for PT Re-Evaluation 04/25/21    Authorization Type Medicare KX at 15    Progress Note Due on Visit 10    PT Start Time 0803    PT Stop Time 0842    PT Time Calculation (min) 39 min    Activity Tolerance Patient tolerated treatment well    Behavior During Therapy [facility] for tasks assessed/performed             Past Medical History:  Diagnosis Date   Anemia    ARTHRITIS, KNEE 09/17/2007   Asthma 04/04/2010   Cataract 01/07/2019   Closed fracture of distal end of left radius 11/01/2015   Complete deafness    Meningitis at age 6   Deaf    Diabetes mellitus    Diabetic neuropathy ([place])    Ganglion cyst 06/22/2011   Gastroparesis    GERD (gastroesophageal reflux disease)    H/O: C-section    Hyperlipidemia    Hypertension    Neuromuscular disorder ([place])    diabetic neuropathy   PSVT (paroxysmal supraventricular tachycardia) ([place]) 11/09/2017   Event monitor 09/14/2017 - Predominantly sinus rhythm with episodes of narrow-complex tachycardia suggestive of paroxysmal supraventricular tachycardia.   S/P appy     Past Surgical History:  Procedure Laterality Date   APPENDECTOMY     cardiolyte EF 77%, no ischemia in 2006  2006   [place] Right 04/20/2015   Procedure: RIGHT CARPAL TUNNEL RELEASE;  Surgeon: Daryll Brod, MD;  Location: [place];  Service: Orthopedics;  Laterality: Right;   CARPAL TUNNEL RELEASE Left 11/04/2015   Procedure: CARPAL TUNNEL RELEASE;  Surgeon: Daryll Brod, MD;  Location: [place];  Service: Orthopedics;  Laterality: Left;    CESAREAN SECTION     OPEN REDUCTION INTERNAL FIXATION (ORIF) DISTAL RADIAL FRACTURE Left 11/04/2015   Procedure: OPEN REDUCTION INTERNAL FIXATION (ORIF) LEFT DISTAL RADIAL FRACTURE POSSIBLE BONE GRAFT;  Surgeon: Daryll Brod, MD;  Location: [place];  Service: Orthopedics;  Laterality: Left;   TRIGGER FINGER RELEASE Right 04/20/2015   Procedure: RELEASE TRIGGER FINGER/A-1 PULLEY RIGHT MIDDLE FINGER,RIGHT RING FINGER;  Surgeon: Daryll Brod, MD;  Location: [place];  Service: Orthopedics;  Laterality: Right;   TUBAL LIGATION     ULNAR NERVE TRANSPOSITION Right 04/20/2015   Procedure: RIGHT ULNAR NERVE DECOMPRESSION;  Surgeon: Daryll Brod, MD;  Location: [place];  Service: Orthopedics;  Laterality: Right;   UTERINE FIBROID EMBOLIZATION  2007   WRIST SURGERY     Cyst removed on left   WRIST SURGERY Right 1985   tendon repair R wrist    There were no vitals filed for this visit.   Subjective Assessment - 03/09/21 0809     Subjective Pt arriving today reporting 5/10 low back pain. Pt stating she is sleeping fairly well.    Patient is accompained by: Interpreter    Limitations Standing;Walking;House hold activities;Sitting    Diagnostic tests MRI indicated spinal canal stenosis mid lumbar    Currently in Pain? Yes    Pain  Score 5     Pain Location Back    Pain Orientation Lower    Pain Descriptors / Indicators Tightness;Aching    Pain Type Chronic pain                               [facility] Adult PT Treatment/Exercise - 03/09/21 0001       Lumbar Exercises: Stretches   Double Knee to Chest Stretch 5 reps;10 seconds    Lower Trunk Rotation 3 reps   15 sec x 3 bilateral   Figure 4 Stretch 3 reps;20 seconds      Lumbar Exercises: Aerobic   Nustep L6 x 8 minutes      Lumbar Exercises: Standing   Heel Raises 10 reps    Other Standing Lumbar Exercises marching x 20 with UE support    Other Standing Lumbar Exercises  Airex: feet apart with intermittent support and CGA to maintain balance.      Lumbar Exercises: Seated   Sit to Stand 10 reps   18 inch table without UE     Lumbar Exercises: Supine   Bridge 20 reps      Lumbar Exercises: Sidelying   Clam Both;10 reps    Hip Abduction Both;10 reps                  Upper Extremity Functional Index Score :   /80     PT Short Term Goals - 03/09/21 [id number]       PT SHORT TERM GOAL #1   Title Patient will demonstrate independent use of home exercise program to maintain progress from in clinic treatments.    Status On-going               PT Long Term Goals - 02/14/21 0759       PT LONG TERM GOAL #1   Title Patient will demonstrate/report pain at worst less than or equal to 2/10 to facilitate minimal limitation in daily activity secondary to pain symptoms.    Time 10    Period Weeks    Status New    Target Date 04/25/21      PT LONG TERM GOAL #2   Title Patient will demonstrate independent use of home exercise program to facilitate ability to maintain/progress functional gains from skilled physical therapy services.    Time 10    Period Weeks    Status New    Target Date 04/25/21      PT LONG TERM GOAL #3   Title Pt. will demonstrate FOTO outcome > or =58% to indicated reduced disability due to condition.    Time 10    Period Weeks    Status New    Target Date 04/25/21      PT LONG TERM GOAL #4   Title Pt. will demonstrate active lumbar extension in standing 100 % WFL s symptoms to facilitate upright standing, walking at PLOF.    Time 10    Period Weeks    Status New    Target Date 04/25/21      PT LONG TERM GOAL #5   Title Pt. will demonstrate lateral flexion to knee joint s symptoms to facilitate bed mobility and bending at PLOF.    Time 10    Period Weeks    Status New    Target Date 04/25/21  Plan - 03/09/21 [id number]     Clinical Impression Statement Pt with increased pain today from  last visit. Pt reporting 5/10 low back pain. Pt tolerating treatment with lumbar stretching and core activation and strengthening. Pt needs to work on dynamic balance. Pt with several episodes of leg cramping during session today.  Pt reporting concerns of recent weight loss where she is down to 125 pounds from 160. Pt reporting she has a visit with her PCP tomorrow and pt was advised to bring up her concerns. Continue skilled PT as pt tolerates.    Personal Factors and Comorbidities Comorbidity 3+    Comorbidities OA, asthma, Lt distal radius fx, deaf, DM with neuropathy, HTN,    Examination-Activity Limitations Sit;Sleep;Bed Mobility;Bend;Squat;Caring for Others;Carry;Stand;Transfers;Lift;Locomotion Level    Examination-Participation Restrictions Community Activity;Occupation;Meal Prep;Laundry;Interpersonal Relationship;Shop;Cleaning    Stability/Clinical Decision Making Stable/Uncomplicated    PT Frequency 2x / week    PT Duration Other (comment)    PT Treatment/Interventions ADLs/Self Care Home Management;Cryotherapy;Electrical Stimulation;Moist Heat;Traction;Balance training;Therapeutic exercise;Therapeutic activities;Iontophoresis '4mg'$ /ml Dexamethasone;Functional mobility training;Stair training;Gait training;DME Instruction;Ultrasound;Neuromuscular re-education;Patient/family education;Passive range of motion;Spinal Manipulations;Joint Manipulations;Dry needling;Taping;Manual techniques    PT Next Visit Plan Continue use of flexion based symptom relief direcitonal preference  Progress hip strength and overall endurance    PT Home Exercise Plan [id number]    Consulted and Agree with Plan of Care Patient             Patient will benefit from skilled therapeutic intervention in order to improve the following deficits and impairments:  Decreased endurance, Hypomobility, Pain, Increased fascial restricitons, Decreased activity tolerance, Decreased mobility, Difficulty walking, Increased muscle  spasms, Improper body mechanics, Impaired perceived functional ability, Impaired flexibility, Decreased range of motion  Visit Diagnosis: Chronic bilateral low back pain with bilateral sciatica  Pain in left leg  Pain in right leg  Difficulty in walking, not elsewhere classified     Problem List Patient Active Problem List   Diagnosis Date Noted   Hemorrhoids 02/03/2021   Irritable bowel syndrome 11/22/2020   Acute viral sinusitis 11/12/2020   Bilateral sciatica 10/27/2020   Greater trochanteric bursitis of both hips 10/27/2020   Chronic idiopathic constipation 06/25/2020   Elevated blood sugar 06/10/2020   Hernia of abdominal wall 01/28/2020   Spinal stenosis at L4-L5 level 01/12/2020   Transient neurologic deficit 12/22/2019   Frequent loose stools 04/20/2019   Hyperthyroidism 03/07/2019   Hypertension 01/07/2019   Hypercalcemia 10/08/2018   Seasonal allergies 08/27/2018   Osteoarthritis involving multiple joints on both sides of body 05/25/2018   Statin intolerance 12/26/2017   PSVT (paroxysmal supraventricular tachycardia) ([place]) 11/09/2017   Chest pain 08/30/2017   Hyperlipidemia 08/30/2017   OSA (obstructive sleep apnea) 08/31/2016   Diabetes ([place]) 09/14/2015   Muscle pain 09/28/2014   Seizure ([place]) 06/08/2014   Iron deficiency anemia 07/01/2012   Diabetic retinopathy ([place]) 07/21/2011   GERD 12/30/2007   Diabetic polyneuropathy ([place]) 08/30/2006   Type 2 diabetes mellitus with ophthalmic complication ([place]) [id number]   HYPERCHOLESTEROLEMIA 08/30/2006   Hearing loss 08/30/2006    Oretha Caprice, PT, MPT 03/09/2021, 8:46 AM  [facility] Physical Therapy [address] [place], [place], [address] Phone: [phone]   Fax:  [phone]  Name: NANSI ELLIFRITZ MRN: [id number] Date of Birth: [birth year]

## 2021-03-09 NOTE — [place] Notes (Addendum)
    SUBJECTIVE:   CHIEF COMPLAINT / HPI: DM f/u   Diabetes Current Regimen: Victoza 1.8 and basaglar 10 units & metformin, still takes 2-4 units of Novolog with meals  CBGs: 200-300s with steroid injections, fasting this AM it was 202, reports having recent reading of 60 after not eating dinner  Last A1c:  Lab Results  Component Value Date   HGBA1C 8.2 (A) 03/10/2021   Denies polyuria, polydipsia  +hypoglycemia (after missing dinner and fasting) Last Eye Exam: patient reports upcoming appt in next 1-2 months, last time had good report  Statin: crestor   HM  Patient recommended for COVID (three total) would prefer to do it on a Friday at [facility] for scheduling purpose. Agreeable to PNA vaccine &  influenza Recommended Shingrix, diabetes eye exam, DEXA scan.   PERTINENT  PMH / PSH:  T2DM   OBJECTIVE:   BP 136/76   Pulse 96   Wt 129 lb 3.2 oz (58.6 kg)   SpO2 100%   BMI 22.18 kg/m   General: female appearing stated age in no acute distress, well-appearing Cardio: Normal S1 and S2, no S3 or S4. Rhythm is regular. No murmurs or rubs.  Bilateral radial pulses palpable Pulm: Clear to auscultation bilaterally, no crackles, wheezing, or diminished breath sounds. Normal respiratory effort, stable on room air Abdomen: Bowel sounds normal. Abdomen soft and non-tender. Extremities: No peripheral edema. Warm/ well perfused.    ASSESSMENT/PLAN:   Diabetes (HCC) Repeat hemoglobin A1c today Continue Basaglar 10 units Continue NovoLog 2 to 4 units with meals Continue metformin Continue Victoza 1.8 mg Patient to follow-up in 1 month and continue with blood glucose checks at home  Healthcare maintenance Hemoglobin A1c checked today DEXA scan ordered, patient to schedule appointment Pneumonia and influenza vaccines administered today Patient to schedule fourth COVID booster on Friday in case she has symptoms following vaccination  Hypercalcemia Elevated calcium of 10.5. Future  order to check PTH and recheck calcium entered for follow up visit.      Eulis Foster, MD [place]

## 2021-03-10 ENCOUNTER — Other Ambulatory Visit: Payer: Self-pay

## 2021-03-10 ENCOUNTER — Ambulatory Visit (INDEPENDENT_AMBULATORY_CARE_PROVIDER_SITE_OTHER): Payer: Medicare Other | Admitting: Family Medicine

## 2021-03-10 ENCOUNTER — Encounter: Payer: Self-pay | Admitting: Family Medicine

## 2021-03-10 VITALS — BP 136/76 | HR 96 | Wt 129.2 lb

## 2021-03-10 DIAGNOSIS — Z23 Encounter for immunization: Secondary | ICD-10-CM | POA: Diagnosis not present

## 2021-03-10 DIAGNOSIS — E119 Type 2 diabetes mellitus without complications: Secondary | ICD-10-CM

## 2021-03-10 DIAGNOSIS — E11311 Type 2 diabetes mellitus with unspecified diabetic retinopathy with macular edema: Secondary | ICD-10-CM

## 2021-03-10 DIAGNOSIS — Z794 Long term (current) use of insulin: Secondary | ICD-10-CM | POA: Diagnosis not present

## 2021-03-10 DIAGNOSIS — E2839 Other primary ovarian failure: Secondary | ICD-10-CM

## 2021-03-10 DIAGNOSIS — Z0000 Encounter for general adult medical examination without abnormal findings: Secondary | ICD-10-CM

## 2021-03-10 LAB — POCT GLYCOSYLATED HEMOGLOBIN (HGB A1C): HbA1c, POC (controlled diabetic range): 8.2 % — AB (ref 0.0–7.0)

## 2021-03-10 MED ORDER — BASAGLAR KWIKPEN 100 UNIT/ML ~~LOC~~ SOPN: 10.0000 [IU] | PEN_INJECTOR | Freq: Every day | SUBCUTANEOUS | 5 refills | Status: DC

## 2021-03-10 NOTE — Assessment & Plan Note (Signed)
>>  ASSESSMENT AND PLAN FOR DIABETES (HCC) WRITTEN ON 03/10/2021  9:05 AM BY SIMMONS-ROBINSON, MAKIERA, MD  Repeat hemoglobin A1c today Continue Basaglar  10 units Continue NovoLog  2 to 4 units with meals Continue metformin  Continue Victoza  1.8 mg Patient to follow-up in 1 month and continue with blood glucose checks at home

## 2021-03-10 NOTE — Assessment & Plan Note (Signed)
Repeat hemoglobin A1c today Continue Basaglar 10 units Continue NovoLog 2 to 4 units with meals Continue metformin Continue Victoza 1.8 mg Patient to follow-up in 1 month and continue with blood glucose checks at home

## 2021-03-10 NOTE — Assessment & Plan Note (Signed)
Elevated calcium of 10.5. Future order to check PTH and recheck calcium entered for follow up visit.

## 2021-03-10 NOTE — Patient Instructions (Addendum)
It was a pleasure to see you today!  Thank you for choosing [facility] for your primary care.   Alicia [name] was seen for diabetes.   Our plans for today were: Continue your diabetes medications as prescribed.  Please schedule an appointment with your GI specialist ASAP to discuss the concerns you have for your digestive system.  I have submitted a refill for your insulin.   To keep you healthy, please keep in mind the following health maintenance items that you are due for:   DEXA Scan has been ordered, please schedule an appointment with the [facility] in [place].  Please schedule fourth covid vaccine at your earliest convenience.  We will complete your flu and pneumonia vaccines today.  Diabetes Eye exam will be due soon.    You should return to our clinic in 1 month for diabetes check up.   Best Wishes,   Dr. Alba Cory

## 2021-03-10 NOTE — Addendum Note (Signed)
Addended by: Adolph Pollack on: 03/10/2021 09:53 PM   Modules accepted: Orders

## 2021-03-10 NOTE — Assessment & Plan Note (Signed)
Hemoglobin A1c checked today DEXA scan ordered, patient to schedule appointment Pneumonia and influenza vaccines administered today Patient to schedule fourth COVID booster on Friday in case she has symptoms following vaccination

## 2021-03-14 DIAGNOSIS — E113511 Type 2 diabetes mellitus with proliferative diabetic retinopathy with macular edema, right eye: Secondary | ICD-10-CM | POA: Diagnosis not present

## 2021-03-14 DIAGNOSIS — H4312 Vitreous hemorrhage, left eye: Secondary | ICD-10-CM | POA: Diagnosis not present

## 2021-03-14 DIAGNOSIS — E113592 Type 2 diabetes mellitus with proliferative diabetic retinopathy without macular edema, left eye: Secondary | ICD-10-CM | POA: Diagnosis not present

## 2021-03-14 DIAGNOSIS — H3582 Retinal ischemia: Secondary | ICD-10-CM | POA: Diagnosis not present

## 2021-03-16 ENCOUNTER — Encounter: Payer: Medicare Other | Admitting: Rehabilitative and Restorative Service Providers"

## 2021-03-18 ENCOUNTER — Encounter: Payer: Medicare Other | Admitting: Rehabilitative and Restorative Service Providers"

## 2021-03-21 ENCOUNTER — Telehealth: Payer: Self-pay

## 2021-03-21 NOTE — Telephone Encounter (Signed)
Patient calls nurse line reporting she will be out of basaglar as of this week. Patient called the pharmacy for a refill, however was told insurance will not pay again until 10/28. I called the pharmacy and was told she should not be running out soon if she is taking 10 units daily. Pharmacist stated he will call her insurance to see if he can get an override code. VM left for patient informing of above.

## 2021-03-22 ENCOUNTER — Encounter: Payer: Medicare Other | Admitting: Rehabilitative and Restorative Service Providers"

## 2021-03-24 ENCOUNTER — Encounter: Payer: Medicare Other | Admitting: Rehabilitative and Restorative Service Providers"

## 2021-03-29 ENCOUNTER — Encounter: Payer: Self-pay | Admitting: Rehabilitative and Restorative Service Providers"

## 2021-03-29 ENCOUNTER — Other Ambulatory Visit: Payer: Self-pay

## 2021-03-29 ENCOUNTER — Ambulatory Visit (INDEPENDENT_AMBULATORY_CARE_PROVIDER_SITE_OTHER): Payer: Medicare Other | Admitting: Rehabilitative and Restorative Service Providers"

## 2021-03-29 DIAGNOSIS — G8929 Other chronic pain: Secondary | ICD-10-CM

## 2021-03-29 DIAGNOSIS — M79605 Pain in left leg: Secondary | ICD-10-CM

## 2021-03-29 DIAGNOSIS — R262 Difficulty in walking, not elsewhere classified: Secondary | ICD-10-CM

## 2021-03-29 DIAGNOSIS — M5441 Lumbago with sciatica, right side: Secondary | ICD-10-CM

## 2021-03-29 DIAGNOSIS — M5442 Lumbago with sciatica, left side: Secondary | ICD-10-CM | POA: Diagnosis not present

## 2021-03-29 DIAGNOSIS — M79604 Pain in right leg: Secondary | ICD-10-CM

## 2021-03-29 NOTE — Therapy (Addendum)
[facility] [address] [place], [place], [address] Phone: [phone]   Fax:  [phone]  Physical Therapy Treatment  Patient Details  Name: Alicia [name] MRN: [id number] Date of Birth: 01-Mar-1956 Referring Provider (PT): Gregor Hams, MD   Encounter Date: 03/29/2021   PT End of Session - 03/29/21 0808     Visit Number 6    Number of Visits 20    Date for PT Re-Evaluation 04/25/21    Authorization Type Medicare KX at 15    Progress Note Due on Visit 10    PT Start Time 0802    PT Stop Time 0841    PT Time Calculation (min) 39 min    Activity Tolerance Patient tolerated treatment well    Behavior During Therapy [facility] for tasks assessed/performed             Past Medical History:  Diagnosis Date   Anemia    ARTHRITIS, KNEE 09/17/2007   Asthma 04/04/2010   Cataract 01/07/2019   Closed fracture of distal end of left radius 11/01/2015   Complete deafness    Meningitis at age 78   Deaf    Diabetes mellitus    Diabetic neuropathy ([place])    Ganglion cyst 06/22/2011   Gastroparesis    GERD (gastroesophageal reflux disease)    H/O: C-section    Hyperlipidemia    Hypertension    Neuromuscular disorder ([place])    diabetic neuropathy   PSVT (paroxysmal supraventricular tachycardia) ([place]) 11/09/2017   Event monitor 09/14/2017 - Predominantly sinus rhythm with episodes of narrow-complex tachycardia suggestive of paroxysmal supraventricular tachycardia.   S/P appy     Past Surgical History:  Procedure Laterality Date   APPENDECTOMY     cardiolyte EF 77%, no ischemia in 2006  2006   [place] Right 04/20/2015   Procedure: RIGHT CARPAL TUNNEL RELEASE;  Surgeon: Daryll Brod, MD;  Location: [place];  Service: Orthopedics;  Laterality: Right;   CARPAL TUNNEL RELEASE Left 11/04/2015   Procedure: CARPAL TUNNEL RELEASE;  Surgeon: Daryll Brod, MD;  Location: [place];  Service: Orthopedics;  Laterality:  Left;   CESAREAN SECTION     OPEN REDUCTION INTERNAL FIXATION (ORIF) DISTAL RADIAL FRACTURE Left 11/04/2015   Procedure: OPEN REDUCTION INTERNAL FIXATION (ORIF) LEFT DISTAL RADIAL FRACTURE POSSIBLE BONE GRAFT;  Surgeon: Daryll Brod, MD;  Location: [place];  Service: Orthopedics;  Laterality: Left;   TRIGGER FINGER RELEASE Right 04/20/2015   Procedure: RELEASE TRIGGER FINGER/A-1 PULLEY RIGHT MIDDLE FINGER,RIGHT RING FINGER;  Surgeon: Daryll Brod, MD;  Location: [place];  Service: Orthopedics;  Laterality: Right;   TUBAL LIGATION     ULNAR NERVE TRANSPOSITION Right 04/20/2015   Procedure: RIGHT ULNAR NERVE DECOMPRESSION;  Surgeon: Daryll Brod, MD;  Location: [place];  Service: Orthopedics;  Laterality: Right;   UTERINE FIBROID EMBOLIZATION  2007   WRIST SURGERY     Cyst removed on left   WRIST SURGERY Right 1985   tendon repair R wrist    There were no vitals filed for this visit.   Subjective Assessment - 03/29/21 0807     Subjective Pt. indicated she wasn't able to come in due to eye complaints recently.  Pt. stated at home her pain feels fine but at work she still noticed pain complaints similar c doing things.  Pt. stated pain upon arrival today 2-3/10.  At worst at work 6/10.    Patient is  accompained by: Interpreter    Limitations Standing;Walking;House hold activities;Sitting    Diagnostic tests MRI indicated spinal canal stenosis mid lumbar    Pain Score 2     Pain Location Back    Pain Orientation Lower    Pain Descriptors / Indicators Tightness;Aching    Pain Type Chronic pain    Pain Onset More than a month ago    Pain Frequency Intermittent    Aggravating Factors  work, particularily at end of day.    Pain Relieving Factors resting after work    Effect of Pain on Daily Activities Limited in work activity                [facility] PT Assessment - 03/29/21 0001       Assessment   Medical Diagnosis M54.41,M54.42,G89.29  (ICD-10-CM) - Chronic left-sided low back pain with bilateral sciatica    Referring Provider (PT) Gregor Hams, MD      AROM   Lumbar Flexion to ankles with bilateral thigh pulling    Lumbar Extension 100% WFL without complaints                           [facility] Adult PT Treatment/Exercise - 03/29/21 0001       Lumbar Exercises: Stretches   Passive Hamstring Stretch 3 reps;Left;Right   15 sec x 3 bilateral c strap supine     Lumbar Exercises: Aerobic   Nustep Lvl 6 7 mins UE/LE      Lumbar Exercises: Seated   Sit to Stand --   18 inch table 2 x 10 , no UE     Lumbar Exercises: Supine   Bridge 20 reps;3 seconds           supine sciatic nerve flossing 2 x 10 bilateral          PT Education - 03/29/21 0819     Education Details HEP progression    Person(s) Educated Patient    Methods Explanation;Demonstration;Verbal cues;Handout    Comprehension Returned demonstration;Verbalized understanding              PT Short Term Goals - 03/29/21 0819       PT SHORT TERM GOAL #1   Title Patient will demonstrate independent use of home exercise program to maintain progress from in clinic treatments.    Status Achieved               PT Long Term Goals - 03/29/21 5397       PT LONG TERM GOAL #1   Title Patient will demonstrate/report pain at worst less than or equal to 2/10 to facilitate minimal limitation in daily activity secondary to pain symptoms.    Time 10    Period Weeks    Status On-going    Target Date 04/25/21      PT LONG TERM GOAL #2   Title Patient will demonstrate independent use of home exercise program to facilitate ability to maintain/progress functional gains from skilled physical therapy services.    Time 10    Period Weeks    Status On-going    Target Date 04/25/21      PT LONG TERM GOAL #3   Title Pt. will demonstrate FOTO outcome > or =58% to indicated reduced disability due to condition.    Time 10    Period  Weeks    Status On-going    Target Date 04/25/21  PT LONG TERM GOAL #4   Title Pt. will demonstrate active lumbar extension in standing 100 % WFL s symptoms to facilitate upright standing, walking at PLOF.    Baseline update: 100% achieved but still limited in activity    Time 10    Period Weeks    Status Partially Met    Target Date 04/25/21      PT LONG TERM GOAL #5   Title Pt. will demonstrate lateral flexion to knee joint s symptoms to facilitate bed mobility and bending at PLOF.    Time 10    Period Weeks    Status On-going    Target Date 04/25/21                   Plan - 03/29/21 0823     Clinical Impression Statement Pt. progressed in observed mobility today in lumbar reassessment of AROM.  Limitations still communicated in work activity (standing, bending, lifting) although reported at reduced severity overall.  Inclusion today to continue to improve neurodynamics and hamstring mobility to help improve posterior thigh symptoms.    Personal Factors and Comorbidities Comorbidity 3+    Comorbidities OA, asthma, Lt distal radius fx, deaf, DM with neuropathy, HTN,    Examination-Activity Limitations Sit;Sleep;Bed Mobility;Bend;Squat;Caring for Others;Carry;Stand;Transfers;Lift;Locomotion Level    Examination-Participation Restrictions Community Activity;Occupation;Meal Prep;Laundry;Interpersonal Relationship;Shop;Cleaning    Stability/Clinical Decision Making Stable/Uncomplicated    PT Frequency 2x / week    PT Duration Other (comment)    PT Treatment/Interventions ADLs/Self Care Home Management;Cryotherapy;Electrical Stimulation;Moist Heat;Traction;Balance training;Therapeutic exercise;Therapeutic activities;Iontophoresis 4mg /ml Dexamethasone;Functional mobility training;Stair training;Gait training;DME Instruction;Ultrasound;Neuromuscular re-education;Patient/family education;Passive range of motion;Spinal Manipulations;Joint Manipulations;Dry needling;Taping;Manual  techniques    PT Next Visit Plan Continue use of flexion based symptom relief direcitonal preference for symptom relief.  Continue to improve hip strength and mobility.    PT Home Exercise Plan [email or web address]    Consulted and Agree with Plan of Care Patient             Patient will benefit from skilled therapeutic intervention in order to improve the following deficits and impairments:  Decreased endurance, Hypomobility, Pain, Increased fascial restricitons, Decreased activity tolerance, Decreased mobility, Difficulty walking, Increased muscle spasms, Improper body mechanics, Impaired perceived functional ability, Impaired flexibility, Decreased range of motion  Visit Diagnosis: Chronic bilateral low back pain with bilateral sciatica  Pain in left leg  Pain in right leg  Difficulty in walking, not elsewhere classified     Problem List Patient Active Problem List   Diagnosis Date Noted   Hemorrhoids 02/03/2021   Irritable bowel syndrome 11/22/2020   Acute viral sinusitis 11/12/2020   Bilateral sciatica 10/27/2020   Greater trochanteric bursitis of both hips 10/27/2020   Chronic idiopathic constipation 06/25/2020   Elevated blood sugar 06/10/2020   Hernia of abdominal wall 01/28/2020   Spinal stenosis at L4-L5 level 01/12/2020   Transient neurologic deficit 12/22/2019   Frequent loose stools 04/20/2019   Hyperthyroidism 03/07/2019   Hypertension 01/07/2019   Hypercalcemia 10/08/2018   Seasonal allergies 08/27/2018   Osteoarthritis involving multiple joints on both sides of body 05/25/2018   Statin intolerance 12/26/2017   PSVT (paroxysmal supraventricular tachycardia) ([place]) 11/09/2017   Chest pain 08/30/2017   Hyperlipidemia 08/30/2017   OSA (obstructive sleep apnea) 08/31/2016   Healthcare maintenance 03/07/2016   Diabetes ([place]) 09/14/2015   Muscle pain 09/28/2014   Seizure ([place]) 06/08/2014   Iron deficiency anemia 07/01/2012   Diabetic retinopathy ([place]) 07/21/2011    GERD 12/30/2007   Diabetic polyneuropathy ([place])  08/30/2006   Type 2 diabetes mellitus with ophthalmic complication ([place]) 00/16/4290   HYPERCHOLESTEROLEMIA 08/30/2006   Hearing loss 08/30/2006    Scot Jun, PT, DPT, OCS, ATC 03/29/21  8:36 AM    [facility] [address] [place], [place], [address] Phone: [phone]   Fax:  [phone]  Name: Alicia [name] MRN: [id number] Date of Birth: Jul 10, 1955

## 2021-03-29 NOTE — Patient Instructions (Signed)
Access Code: [email or web address] URL: [email or web address] Date: 03/29/2021 Prepared by: Scot Jun  Exercises Supine Lower Trunk Rotation - 2 x daily - 7 x weekly - 1 sets - 5 reps - 15 hold Supine Bridge - 2 x daily - 7 x weekly - 3 sets - 10 reps - 2 hold Hooklying Single Knee to Chest Stretch - 2 x daily - 7 x weekly - 1 sets - 5 reps - 15 hold Supine Double Knee to Chest - 2 x daily - 7 x weekly - 1 sets - 5 reps - 15 hold Clamshell - 2 x daily - 7 x weekly - 3 sets - 10 reps Supine Hamstring Stretch with Strap - 2 x daily - 7 x weekly - 1 sets - 5 reps - 15 hold Supine 90/90 Sciatic Nerve Glide with Knee Flexion/Extension - 2 x daily - 7 x weekly - 3 sets - 10 reps

## 2021-03-31 ENCOUNTER — Encounter: Payer: Self-pay | Admitting: Rehabilitative and Restorative Service Providers"

## 2021-03-31 ENCOUNTER — Ambulatory Visit (INDEPENDENT_AMBULATORY_CARE_PROVIDER_SITE_OTHER): Payer: Medicare Other | Admitting: Rehabilitative and Restorative Service Providers"

## 2021-03-31 ENCOUNTER — Other Ambulatory Visit: Payer: Self-pay

## 2021-03-31 DIAGNOSIS — M79605 Pain in left leg: Secondary | ICD-10-CM | POA: Diagnosis not present

## 2021-03-31 DIAGNOSIS — G8929 Other chronic pain: Secondary | ICD-10-CM

## 2021-03-31 DIAGNOSIS — M79604 Pain in right leg: Secondary | ICD-10-CM | POA: Diagnosis not present

## 2021-03-31 DIAGNOSIS — R262 Difficulty in walking, not elsewhere classified: Secondary | ICD-10-CM

## 2021-03-31 DIAGNOSIS — M5442 Lumbago with sciatica, left side: Secondary | ICD-10-CM

## 2021-03-31 DIAGNOSIS — M5441 Lumbago with sciatica, right side: Secondary | ICD-10-CM

## 2021-03-31 NOTE — Therapy (Signed)
[facility] Physical Therapy [address] [place], [place], [address] Phone: [phone]   Fax:  [phone]  Physical Therapy Treatment  Patient Details  Name: Alicia [name] MRN: [id number] Date of Birth: Jun 11, 1956 Referring Provider (PT): Gregor Hams, MD   Encounter Date: 03/31/2021   PT End of Session - 03/31/21 0826     Visit Number 7    Number of Visits 20    Date for PT Re-Evaluation 04/25/21    Authorization Type Medicare KX at 15    Progress Note Due on Visit 10    PT Start Time 0804    PT Stop Time 0843    PT Time Calculation (min) 39 min    Activity Tolerance Patient tolerated treatment well    Behavior During Therapy [facility] for tasks assessed/performed             Past Medical History:  Diagnosis Date   Anemia    ARTHRITIS, KNEE 09/17/2007   Asthma 04/04/2010   Cataract 01/07/2019   Closed fracture of distal end of left radius 11/01/2015   Complete deafness    Meningitis at age 31   Deaf    Diabetes mellitus    Diabetic neuropathy ([place])    Ganglion cyst 06/22/2011   Gastroparesis    GERD (gastroesophageal reflux disease)    H/O: C-section    Hyperlipidemia    Hypertension    Neuromuscular disorder ([place])    diabetic neuropathy   PSVT (paroxysmal supraventricular tachycardia) ([place]) 11/09/2017   Event monitor 09/14/2017 - Predominantly sinus rhythm with episodes of narrow-complex tachycardia suggestive of paroxysmal supraventricular tachycardia.   S/P appy     Past Surgical History:  Procedure Laterality Date   APPENDECTOMY     cardiolyte EF 77%, no ischemia in 2006  2006   [place] Right 04/20/2015   Procedure: RIGHT CARPAL TUNNEL RELEASE;  Surgeon: Daryll Brod, MD;  Location: [place];  Service: Orthopedics;  Laterality: Right;   CARPAL TUNNEL RELEASE Left 11/04/2015   Procedure: CARPAL TUNNEL RELEASE;  Surgeon: Daryll Brod, MD;  Location: [place];  Service: Orthopedics;  Laterality:  Left;   CESAREAN SECTION     OPEN REDUCTION INTERNAL FIXATION (ORIF) DISTAL RADIAL FRACTURE Left 11/04/2015   Procedure: OPEN REDUCTION INTERNAL FIXATION (ORIF) LEFT DISTAL RADIAL FRACTURE POSSIBLE BONE GRAFT;  Surgeon: Daryll Brod, MD;  Location: [place];  Service: Orthopedics;  Laterality: Left;   TRIGGER FINGER RELEASE Right 04/20/2015   Procedure: RELEASE TRIGGER FINGER/A-1 PULLEY RIGHT MIDDLE FINGER,RIGHT RING FINGER;  Surgeon: Daryll Brod, MD;  Location: [place];  Service: Orthopedics;  Laterality: Right;   TUBAL LIGATION     ULNAR NERVE TRANSPOSITION Right 04/20/2015   Procedure: RIGHT ULNAR NERVE DECOMPRESSION;  Surgeon: Daryll Brod, MD;  Location: [place];  Service: Orthopedics;  Laterality: Right;   UTERINE FIBROID EMBOLIZATION  2007   WRIST SURGERY     Cyst removed on left   WRIST SURGERY Right 1985   tendon repair R wrist    There were no vitals filed for this visit.   Subjective Assessment - 03/31/21 0824     Subjective Pt. stated 5/10 pain in back today.  She indicated she woke up late and was running behind today.  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 Also noted c standing cooking and transfers after prolonged sitting.  Mainly back symptoms indicated.  Patient is accompained by: Interpreter    Limitations Standing;Walking;House hold activities;Sitting    Diagnostic tests MRI indicated spinal canal stenosis mid lumbar    Currently in Pain? Yes    Pain Score 5     Pain Location Back    Pain Orientation Lower    Pain Type Chronic pain    Pain Onset More than a month ago    Pain Frequency Intermittent    Aggravating Factors  work, bending forward, standing cooking, sweeping at work    Pain Relieving Factors resting                [facility] PT Assessment - 03/31/21 0001       Assessment   Medical Diagnosis M54.41,M54.42,G89.29 (ICD-10-CM) - Chronic  left-sided low back pain with bilateral sciatica    Referring Provider (PT) Gregor Hams, MD      AROM   Lumbar Flexion to ankles with pain in back produced end range    Lumbar Extension 100% WFL with ERP lumbar                           [facility] Adult PT Treatment/Exercise - 03/31/21 0001       Therapeutic Activites    Therapeutic Activities Other Therapeutic Activities    Other Therapeutic Activities Education c verbal and visual cues for ways to avoid 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  Detailed use of half kneeling as well as squats c neutral lumbar positioning.  Included golfer's lift education as well. Extended time spent to ensure Pt. understood involvement of sustained flexion loading and symptom aggravation. Activity modification will hopefully improve activity tolerance at work.      Lumbar Exercises: Stretches   Passive Hamstring Stretch Left;Right;3 reps   15 sec x 3 bilateral   Other Lumbar Stretch Exercise supine sciatic nerve flossing 2 x 10 bilateral      Lumbar Exercises: Machines for Strengthening   Leg Press Double leg 56 lbs 2 x 10                       PT Short Term Goals - 03/29/21 0263       PT SHORT TERM GOAL #1   Title Patient will demonstrate independent use of home exercise program to maintain progress from in clinic treatments.    Status Achieved               PT Long Term Goals - 03/29/21 7858       PT LONG TERM GOAL #1   Title Patient will demonstrate/report pain at worst less than or equal to 2/10 to facilitate minimal limitation in daily activity secondary to pain symptoms.    Time 10    Period Weeks    Status On-going    Target Date 04/25/21      PT LONG TERM GOAL #2   Title Patient will demonstrate independent use of home exercise program to facilitate ability to maintain/progress functional gains from skilled physical therapy services.    Time 10     Period Weeks    Status On-going    Target Date 04/25/21      PT LONG TERM GOAL #3   Title Pt. will demonstrate FOTO outcome > or =58% to indicated reduced disability due to condition.    Time 10    Period Weeks    Status On-going  Target Date 04/25/21      PT LONG TERM GOAL #4   Title Pt. will demonstrate active lumbar extension in standing 100 % WFL s symptoms to facilitate upright standing, walking at PLOF.    Baseline update: 100% achieved but still limited in activity    Time 10    Period Weeks    Status Partially Met    Target Date 04/25/21      PT LONG TERM GOAL #5   Title Pt. will demonstrate lateral flexion to knee joint s symptoms to facilitate bed mobility and bending at PLOF.    Time 10    Period Weeks    Status On-going    Target Date 04/25/21                   Plan - 03/31/21 0834     Clinical Impression Statement Most evident exacerbation of symptoms related to sustained flexion movement at work and home that has continued to be troublesome and limiting.  Spent additional time today reviewing importance of proper lifting and body mechanics to help reduced sustained flexion aggravation through half kneeling, golfer's lift, etc to facilitate ability to perform work with improved tolerance.    Personal Factors and Comorbidities Comorbidity 3+    Comorbidities OA, asthma, Lt distal radius fx, deaf, DM with neuropathy, HTN,    Examination-Activity Limitations Sit;Sleep;Bed Mobility;Bend;Squat;Caring for Others;Carry;Stand;Transfers;Lift;Locomotion Level    Examination-Participation Restrictions Community Activity;Occupation;Meal Prep;Laundry;Interpersonal Relationship;Shop;Cleaning    Stability/Clinical Decision Making Stable/Uncomplicated    PT Frequency 2x / week    PT Duration Other (comment)    PT Treatment/Interventions ADLs/Self Care Home Management;Cryotherapy;Electrical Stimulation;Moist Heat;Traction;Balance training;Therapeutic exercise;Therapeutic  activities;Iontophoresis 46m/ml Dexamethasone;Functional mobility training;Stair training;Gait training;DME Instruction;Ultrasound;Neuromuscular re-education;Patient/family education;Passive range of motion;Spinal Manipulations;Joint Manipulations;Dry needling;Taping;Manual techniques    PT Next Visit Plan Review body mechanics.  Possible transitioning to include lumbar extension based movements as peripherialization not noted in last few visits. Leg strengthening.    PT Home Exercise Plan [email or web address]    Consulted and Agree with Plan of Care Patient             Patient will benefit from skilled therapeutic intervention in order to improve the following deficits and impairments:  Decreased endurance, Hypomobility, Pain, Increased fascial restricitons, Decreased activity tolerance, Decreased mobility, Difficulty walking, Increased muscle spasms, Improper body mechanics, Impaired perceived functional ability, Impaired flexibility, Decreased range of motion  Visit Diagnosis: Chronic bilateral low back pain with bilateral sciatica  Pain in left leg  Pain in right leg  Difficulty in walking, not elsewhere classified     Problem List Patient Active Problem List   Diagnosis Date Noted   Hemorrhoids 02/03/2021   Irritable bowel syndrome 11/22/2020   Acute viral sinusitis 11/12/2020   Bilateral sciatica 10/27/2020   Greater trochanteric bursitis of both hips 10/27/2020   Chronic idiopathic constipation 06/25/2020   Elevated blood sugar 06/10/2020   Hernia of abdominal wall 01/28/2020   Spinal stenosis at L4-L5 level 01/12/2020   Transient neurologic deficit 12/22/2019   Frequent loose stools 04/20/2019   Hyperthyroidism 03/07/2019   Hypertension 01/07/2019   Hypercalcemia 10/08/2018   Seasonal allergies 08/27/2018   Osteoarthritis involving multiple joints on both sides of body 05/25/2018   Statin intolerance 12/26/2017   PSVT (paroxysmal supraventricular tachycardia) ([place] 11/09/2017    Chest pain 08/30/2017   Hyperlipidemia 08/30/2017   OSA (obstructive sleep apnea) 08/31/2016   Healthcare maintenance 03/07/2016   Diabetes ([place] 09/14/2015   Muscle pain 09/28/2014   Seizure ([place]  06/08/2014   Iron deficiency anemia 07/01/2012   Diabetic retinopathy ([place]) 07/21/2011   GERD 12/30/2007   Diabetic polyneuropathy ([place]) 08/30/2006   Type 2 diabetes mellitus with ophthalmic complication ([place]) 69/48/5462   HYPERCHOLESTEROLEMIA 08/30/2006   Hearing loss 08/30/2006   Scot Jun, PT, DPT, OCS, ATC 03/31/21  8:43 AM   [facility] Physical Therapy [address] [place], [place], [address] Phone: [phone]   Fax:  [phone]  Name: KAREE FORGE MRN: [id number] Date of Birth: 04-Sep-1955

## 2021-04-02 ENCOUNTER — Other Ambulatory Visit: Payer: Self-pay | Admitting: Cardiology

## 2021-04-02 ENCOUNTER — Other Ambulatory Visit: Payer: Self-pay | Admitting: Family Medicine

## 2021-04-12 ENCOUNTER — Other Ambulatory Visit: Payer: Self-pay

## 2021-04-12 ENCOUNTER — Ambulatory Visit (INDEPENDENT_AMBULATORY_CARE_PROVIDER_SITE_OTHER): Payer: Medicare Other | Admitting: Physical Therapy

## 2021-04-12 DIAGNOSIS — M5442 Lumbago with sciatica, left side: Secondary | ICD-10-CM | POA: Diagnosis not present

## 2021-04-12 DIAGNOSIS — G8929 Other chronic pain: Secondary | ICD-10-CM

## 2021-04-12 DIAGNOSIS — M79604 Pain in right leg: Secondary | ICD-10-CM | POA: Diagnosis not present

## 2021-04-12 DIAGNOSIS — M79605 Pain in left leg: Secondary | ICD-10-CM | POA: Diagnosis not present

## 2021-04-12 DIAGNOSIS — M5441 Lumbago with sciatica, right side: Secondary | ICD-10-CM

## 2021-04-12 DIAGNOSIS — R262 Difficulty in walking, not elsewhere classified: Secondary | ICD-10-CM

## 2021-04-12 NOTE — Therapy (Signed)
[facility] Physical Therapy [address] [place], [place], [address] Phone: [phone]   Fax:  [phone]  Physical Therapy Treatment  Patient Details  Name: Alicia [name] MRN: [id number] Date of Birth: March 17, 1956 Referring Provider (PT): Gregor Hams, MD   Encounter Date: 04/12/2021   PT End of Session - 04/12/21 0827     Visit Number 8    Number of Visits 20    Date for PT Re-Evaluation 04/25/21    Authorization Type Medicare KX at 15    Progress Note Due on Visit 10    PT Start Time 0806    PT Stop Time 0845    PT Time Calculation (min) 39 min    Activity Tolerance Patient tolerated treatment well    Behavior During Therapy [facility] for tasks assessed/performed             Past Medical History:  Diagnosis Date   Anemia    ARTHRITIS, KNEE 09/17/2007   Asthma 04/04/2010   Cataract 01/07/2019   Closed fracture of distal end of left radius 11/01/2015   Complete deafness    Meningitis at age 85   Deaf    Diabetes mellitus    Diabetic neuropathy ([place])    Ganglion cyst 06/22/2011   Gastroparesis    GERD (gastroesophageal reflux disease)    H/O: C-section    Hyperlipidemia    Hypertension    Neuromuscular disorder ([place])    diabetic neuropathy   PSVT (paroxysmal supraventricular tachycardia) ([place]) 11/09/2017   Event monitor 09/14/2017 - Predominantly sinus rhythm with episodes of narrow-complex tachycardia suggestive of paroxysmal supraventricular tachycardia.   S/P appy     Past Surgical History:  Procedure Laterality Date   APPENDECTOMY     cardiolyte EF 77%, no ischemia in 2006  2006   [place] Right 04/20/2015   Procedure: RIGHT CARPAL TUNNEL RELEASE;  Surgeon: Daryll Brod, MD;  Location: [place];  Service: Orthopedics;  Laterality: Right;   CARPAL TUNNEL RELEASE Left 11/04/2015   Procedure: CARPAL TUNNEL RELEASE;  Surgeon: Daryll Brod, MD;  Location: [place];  Service: Orthopedics;  Laterality:  Left;   CESAREAN SECTION     OPEN REDUCTION INTERNAL FIXATION (ORIF) DISTAL RADIAL FRACTURE Left 11/04/2015   Procedure: OPEN REDUCTION INTERNAL FIXATION (ORIF) LEFT DISTAL RADIAL FRACTURE POSSIBLE BONE GRAFT;  Surgeon: Daryll Brod, MD;  Location: [place];  Service: Orthopedics;  Laterality: Left;   TRIGGER FINGER RELEASE Right 04/20/2015   Procedure: RELEASE TRIGGER FINGER/A-1 PULLEY RIGHT MIDDLE FINGER,RIGHT RING FINGER;  Surgeon: Daryll Brod, MD;  Location: [employer];  Service: Orthopedics;  Laterality: Right;   TUBAL LIGATION     ULNAR NERVE TRANSPOSITION Right 04/20/2015   Procedure: RIGHT ULNAR NERVE DECOMPRESSION;  Surgeon: Daryll Brod, MD;  Location: [place];  Service: Orthopedics;  Laterality: Right;   UTERINE FIBROID EMBOLIZATION  2007   WRIST SURGERY     Cyst removed on left   WRIST SURGERY Right 1985   tendon repair R wrist    There were no vitals filed for this visit.   Subjective Assessment - 04/12/21 0815     Subjective relays 4/10 pain overall today in her low back with cramping down the back of her legs to her knees. She relays she hurt and was off balance and almost fell after the leg press machine last time and does not want to do this again.    Patient is accompained by:  Interpreter    Limitations Standing;Walking;House hold activities;Sitting    How long can you sit comfortably? 5 mins    How long can you stand comfortably? <30 mins    Diagnostic tests MRI indicated spinal canal stenosis mid lumbar    Patient Stated Goals Reduce pain    Pain Onset More than a month ago              [facility] Adult PT Treatment/Exercise - 04/12/21 0001       Lumbar Exercises: Stretches   Passive Hamstring Stretch Left;Right;3 reps    Passive Hamstring Stretch Limitations 15 sec with strap    Lower Trunk Rotation 5 reps;10 seconds    Other Lumbar Stretch Exercise supine sciatic nerve flossing x 10 bilateral      Lumbar Exercises:  Aerobic   Nustep L5X8 min UE/LE      Lumbar Exercises: Prone   Straight Leg Raise 15 reps      Lumbar Exercises: Quadruped   Madcat/Old Horse 10 reps    Madcat/Old Horse Limitations holding 5 sec    Single Arm Raise 5 reps    Straight Leg Raise 5 reps   attempted bird dog but too difficult to broke this down into legs only then arms only   Opposite Arm/Leg Raise --      Manual Therapy   Manual therapy comments long axis distraction 60 sec x2 on each side in supine, then moved into prone for central PA spinal mobs grade 2 from L2-S1                       PT Short Term Goals - 03/29/21 3149       PT SHORT TERM GOAL #1   Title Patient will demonstrate independent use of home exercise program to maintain progress from in clinic treatments.    Status Achieved               PT Long Term Goals - 03/29/21 7026       PT LONG TERM GOAL #1   Title Patient will demonstrate/report pain at worst less than or equal to 2/10 to facilitate minimal limitation in daily activity secondary to pain symptoms.    Time 10    Period Weeks    Status On-going    Target Date 04/25/21      PT LONG TERM GOAL #2   Title Patient will demonstrate independent use of home exercise program to facilitate ability to maintain/progress functional gains from skilled physical therapy services.    Time 10    Period Weeks    Status On-going    Target Date 04/25/21      PT LONG TERM GOAL #3   Title Pt. will demonstrate FOTO outcome > or =58% to indicated reduced disability due to condition.    Time 10    Period Weeks    Status On-going    Target Date 04/25/21      PT LONG TERM GOAL #4   Title Pt. will demonstrate active lumbar extension in standing 100 % WFL s symptoms to facilitate upright standing, walking at PLOF.    Baseline update: 100% achieved but still limited in activity    Time 10    Period Weeks    Status Partially Met    Target Date 04/25/21      PT LONG TERM GOAL #5    Title Pt. will demonstrate lateral flexion to knee joint s symptoms to facilitate bed  mobility and bending at PLOF.    Time 10    Period Weeks    Status On-going    Target Date 04/25/21                   Plan - 04/12/21 0841     Clinical Impression Statement Added more core strengthening, added in quadriped mobiliy to improve ROM and added in manual therapy in efforts to reduce overall pain and tightness. She appeared to have good overall tolerance to session but does still need cuing to stay in pain free ROM with exercises. Continue POC    Personal Factors and Comorbidities Comorbidity 3+    Comorbidities OA, asthma, Lt distal radius fx, deaf, DM with neuropathy, HTN,    Examination-Activity Limitations Sit;Sleep;Bed Mobility;Bend;Squat;Caring for Others;Carry;Stand;Transfers;Lift;Locomotion Level    Examination-Participation Restrictions Community Activity;Occupation;Meal Prep;Laundry;Interpersonal Relationship;Shop;Cleaning    Stability/Clinical Decision Making Stable/Uncomplicated    PT Frequency 2x / week    PT Duration Other (comment)    PT Treatment/Interventions ADLs/Self Care Home Management;Cryotherapy;Electrical Stimulation;Moist Heat;Traction;Balance training;Therapeutic exercise;Therapeutic activities;Iontophoresis 25m/ml Dexamethasone;Functional mobility training;Stair training;Gait training;DME Instruction;Ultrasound;Neuromuscular re-education;Patient/family education;Passive range of motion;Spinal Manipulations;Joint Manipulations;Dry needling;Taping;Manual techniques    PT Next Visit Plan how was manual therapy and exercises from last session? Do not do leg press, she felt like it made her hurt and be off balance, perform sit to stand or squat instead.    PT Home Exercise Plan [email or web address]    Consulted and Agree with Plan of Care Patient             Patient will benefit from skilled therapeutic intervention in order to improve the following deficits and impairments:   Decreased endurance, Hypomobility, Pain, Increased fascial restricitons, Decreased activity tolerance, Decreased mobility, Difficulty walking, Increased muscle spasms, Improper body mechanics, Impaired perceived functional ability, Impaired flexibility, Decreased range of motion  Visit Diagnosis: Chronic bilateral low back pain with bilateral sciatica  Pain in left leg  Pain in right leg  Difficulty in walking, not elsewhere classified     Problem List Patient Active Problem List   Diagnosis Date Noted   Hemorrhoids 02/03/2021   Irritable bowel syndrome 11/22/2020   Acute viral sinusitis 11/12/2020   Bilateral sciatica 10/27/2020   Greater trochanteric bursitis of both hips 10/27/2020   Chronic idiopathic constipation 06/25/2020   Elevated blood sugar 06/10/2020   Hernia of abdominal wall 01/28/2020   Spinal stenosis at L4-L5 level 01/12/2020   Transient neurologic deficit 12/22/2019   Frequent loose stools 04/20/2019   Hyperthyroidism 03/07/2019   Hypertension 01/07/2019   Hypercalcemia 10/08/2018   Seasonal allergies 08/27/2018   Osteoarthritis involving multiple joints on both sides of body 05/25/2018   Statin intolerance 12/26/2017   PSVT (paroxysmal supraventricular tachycardia) ([place] 11/09/2017   Chest pain 08/30/2017   Hyperlipidemia 08/30/2017   OSA (obstructive sleep apnea) 08/31/2016   Healthcare maintenance 03/07/2016   Diabetes ([place] 09/14/2015   Muscle pain 09/28/2014   Seizure ([place] 06/08/2014   Iron deficiency anemia 07/01/2012   Diabetic retinopathy ([place] 07/21/2011   GERD 12/30/2007   Di[place]neuropathy ([place] 08/30/2006   Type 2 diabetes mellitus with ophthalmic complication ([place] 056/25/6389  HYPERCHOLESTEROLEMIA 08/30/2006   Hearing loss 08/30/2006    BDebbe Odea PT,DPT 04/12/2021, 8:54 AM  [facility] [address] [address]Phone: [phone]  Fax:  [phone] Name: Alicia VULTAGGIOMRN: [id number] of Birth: [birth year]

## 2021-04-14 DIAGNOSIS — E113512 Type 2 diabetes mellitus with proliferative diabetic retinopathy with macular edema, left eye: Secondary | ICD-10-CM | POA: Diagnosis not present

## 2021-04-14 DIAGNOSIS — H3582 Retinal ischemia: Secondary | ICD-10-CM | POA: Diagnosis not present

## 2021-04-14 DIAGNOSIS — E113591 Type 2 diabetes mellitus with proliferative diabetic retinopathy without macular edema, right eye: Secondary | ICD-10-CM | POA: Diagnosis not present

## 2021-04-14 DIAGNOSIS — H4312 Vitreous hemorrhage, left eye: Secondary | ICD-10-CM | POA: Diagnosis not present

## 2021-04-15 ENCOUNTER — Encounter: Payer: Medicare Other | Admitting: Physical Therapy

## 2021-04-18 ENCOUNTER — Ambulatory Visit: Payer: Medicare Other | Admitting: Family Medicine

## 2021-04-19 ENCOUNTER — Encounter: Payer: Medicare Other | Admitting: Physical Therapy

## 2021-04-21 ENCOUNTER — Encounter: Payer: Self-pay | Admitting: Rehabilitative and Restorative Service Providers"

## 2021-04-21 ENCOUNTER — Other Ambulatory Visit: Payer: Self-pay | Admitting: Family Medicine

## 2021-04-21 ENCOUNTER — Other Ambulatory Visit: Payer: Self-pay

## 2021-04-21 ENCOUNTER — Ambulatory Visit (INDEPENDENT_AMBULATORY_CARE_PROVIDER_SITE_OTHER): Payer: Medicare Other | Admitting: Rehabilitative and Restorative Service Providers"

## 2021-04-21 DIAGNOSIS — Z1231 Encounter for screening mammogram for malignant neoplasm of breast: Secondary | ICD-10-CM

## 2021-04-21 DIAGNOSIS — M5442 Lumbago with sciatica, left side: Secondary | ICD-10-CM

## 2021-04-21 DIAGNOSIS — M79604 Pain in right leg: Secondary | ICD-10-CM | POA: Diagnosis not present

## 2021-04-21 DIAGNOSIS — M5441 Lumbago with sciatica, right side: Secondary | ICD-10-CM | POA: Diagnosis not present

## 2021-04-21 DIAGNOSIS — R262 Difficulty in walking, not elsewhere classified: Secondary | ICD-10-CM | POA: Diagnosis not present

## 2021-04-21 DIAGNOSIS — M79605 Pain in left leg: Secondary | ICD-10-CM | POA: Diagnosis not present

## 2021-04-21 DIAGNOSIS — G8929 Other chronic pain: Secondary | ICD-10-CM

## 2021-04-21 NOTE — Patient Instructions (Signed)
Access Code: [email or web address] URL: [email or web address] Date: 04/21/2021 Prepared by: Scot Jun  Exercises Supine Lower Trunk Rotation - 2 x daily - 7 x weekly - 1 sets - 5 reps - 15 hold Supine Bridge - 2 x daily - 7 x weekly - 3 sets - 10 reps - 2 hold Hooklying Single Knee to Chest Stretch - 2 x daily - 7 x weekly - 1 sets - 5 reps - 15 hold Supine Double Knee to Chest - 2 x daily - 7 x weekly - 1 sets - 5 reps - 15 hold Supine Hamstring Stretch with Strap - 2 x daily - 7 x weekly - 1 sets - 5 reps - 15 hold Supine 90/90 Sciatic Nerve Glide with Knee Flexion/Extension - 2 x daily - 7 x weekly - 3 sets - 10 reps Supine Pelvic Tilt with Straight Leg Raise - 1 x daily - 7 x weekly - 3 sets - 10 reps Sit to Stand - 1 x daily - 7 x weekly - 3 sets - 10 reps Sidelying Hip Abduction - 1 x daily - 7 x weekly - 3 sets - 10 reps

## 2021-04-21 NOTE — Therapy (Addendum)
[facility] Physical Therapy [address] [place], [place], [address] Phone: [phone]   Fax:  [phone]  Physical Therapy Treatment/Recertification / Discharge   Patient Details  Name: Alicia [name] MRN: [id number] Date of Birth: Oct 23, 1955 Referring Provider (PT): Gregor Hams, MD   Encounter Date: 04/21/2021  Progress Note Reporting Period 02/14/2021 to 04/21/2021  See note below for Objective Data and Assessment of Progress/Goals.       PT End of Session - 04/21/21 0809     Visit Number 9    Number of Visits 20    Date for PT Re-Evaluation 06/16/21    Authorization Type Medicare KX at 15    Progress Note Due on Visit 19    PT Start Time 0801    PT Stop Time 0841    PT Time Calculation (min) 40 min    Activity Tolerance Patient tolerated treatment well    Behavior During Therapy [facility] for tasks assessed/performed             Past Medical History:  Diagnosis Date   Anemia    ARTHRITIS, KNEE 09/17/2007   Asthma 04/04/2010   Cataract 01/07/2019   Closed fracture of distal end of left radius 11/01/2015   Complete deafness    Meningitis at age 65   Deaf    Diabetes mellitus    Diabetic neuropathy ([place])    Ganglion cyst 06/22/2011   Gastroparesis    GERD (gastroesophageal reflux disease)    H/O: C-section    Hyperlipidemia    Hypertension    Neuromuscular disorder ([place])    diabetic neuropathy   PSVT (paroxysmal supraventricular tachycardia) ([place]) 11/09/2017   Event monitor 09/14/2017 - Predominantly sinus rhythm with episodes of narrow-complex tachycardia suggestive of paroxysmal supraventricular tachycardia.   S/P appy     Past Surgical History:  Procedure Laterality Date   APPENDECTOMY     cardiolyte EF 77%, no ischemia in 2006  2006   [place] Right 04/20/2015   Procedure: RIGHT CARPAL TUNNEL RELEASE;  Surgeon: Daryll Brod, MD;  Location: [place];  Service: Orthopedics;  Laterality: Right;   CARPAL  TUNNEL RELEASE Left 11/04/2015   Procedure: CARPAL TUNNEL RELEASE;  Surgeon: Daryll Brod, MD;  Location: [place];  Service: Orthopedics;  Laterality: Left;   CESAREAN SECTION     OPEN REDUCTION INTERNAL FIXATION (ORIF) DISTAL RADIAL FRACTURE Left 11/04/2015   Procedure: OPEN REDUCTION INTERNAL FIXATION (ORIF) LEFT DISTAL RADIAL FRACTURE POSSIBLE BONE GRAFT;  Surgeon: Daryll Brod, MD;  Location: [place];  Service: Orthopedics;  Laterality: Left;   TRIGGER FINGER RELEASE Right 04/20/2015   Procedure: RELEASE TRIGGER FINGER/A-1 PULLEY RIGHT MIDDLE FINGER,RIGHT RING FINGER;  Surgeon: Daryll Brod, MD;  Location: [place];  Service: Orthopedics;  Laterality: Right;   TUBAL LIGATION     ULNAR NERVE TRANSPOSITION Right 04/20/2015   Procedure: RIGHT ULNAR NERVE DECOMPRESSION;  Surgeon: Daryll Brod, MD;  Location: [place];  Service: Orthopedics;  Laterality: Right;   UTERINE FIBROID EMBOLIZATION  2007   WRIST SURGERY     Cyst removed on left   WRIST SURGERY Right 1985   tendon repair R wrist    There were no vitals filed for this visit.   Subjective Assessment - 04/21/21 0805     Subjective Pt. indicated she was sick in last week and didn't get much of the exercise.  Pt. indicated she still feels pain at work and not so  much at home.  Global Rating of change reported at +6 at this time.    Patient is accompained by: Interpreter    Limitations Standing;Walking;House hold activities;Sitting    Diagnostic tests MRI indicated spinal canal stenosis mid lumbar    Patient Stated Goals Reduce pain    Currently in Pain? Yes    Pain Score 2    pain at worst in last week 8/10   Pain Location Back    Pain Orientation Lower    Pain Descriptors / Indicators Tightness;Aching    Pain Type Chronic pain    Pain Onset More than a month ago    Pain Frequency Intermittent    Aggravating Factors  work activity    Pain Relieving Factors at home,  resting                [facility] PT Assessment - 04/21/21 0001       Assessment   Medical Diagnosis M54.41,M54.42,G89.29 (ICD-10-CM) - Chronic left-sided low back pain with bilateral sciatica    Referring Provider (PT) Gregor Hams, MD      Observation/Other Assessments   Focus on Therapeutic Outcomes (FOTO)  update 67%      AROM   Lumbar Flexion to ankles with tightness in hamstrings bilateral    Lumbar Extension 100% c mild Lt hip complaints      Strength   Overall Strength Comments Lumbar flexion MMT 3/5    Right Hip Flexion 5/5    Right Hip ABduction 4+/5    Left Hip Flexion 5/5    Left Hip ABduction 4/5    Right Knee Flexion 5/5    Right Knee Extension 5/5    Left Knee Flexion 5/5    Left Knee Extension 5/5                           [facility] Adult PT Treatment/Exercise - 04/21/21 0001       Exercises   Other Exercises  Review of HEP and usage at home c verbal cues and trials      Lumbar Exercises: Seated   Sit to Stand 10 reps   fast up, slow down     Lumbar Exercises: Supine   Straight Leg Raise Other (comment)   2 x 10 bilateral     Lumbar Exercises: Sidelying   Other Sidelying Lumbar Exercises hip abduction SLR x 10 On Lt side                     PT Education - 04/21/21 0821     Education Details HEP update, reassessment results, advice on MD return for follow up and continued HEP use.    Person(s) Educated Patient    Methods Explanation;Demonstration;Verbal cues;Handout    Comprehension Returned demonstration;Verbalized understanding              PT Short Term Goals - 03/29/21 0819       PT SHORT TERM GOAL #1   Title Patient will demonstrate independent use of home exercise program to maintain progress from in clinic treatments.    Status Achieved               PT Long Term Goals - 04/21/21 7253       PT LONG TERM GOAL #1   Title Patient will demonstrate/report pain at worst less than or equal to 2/10 to  facilitate minimal limitation in daily activity secondary to pain symptoms.  Time 8    Period Weeks    Status Revised    Target Date 06/16/21      PT LONG TERM GOAL #2   Title Patient will demonstrate independent use of home exercise program to facilitate ability to maintain/progress functional gains from skilled physical therapy services.    Time 8    Period Weeks    Status Revised    Target Date 06/16/21      PT LONG TERM GOAL #3   Title Pt. will demonstrate FOTO outcome > or =58% to indicated reduced disability due to condition.    Time 10    Period Weeks    Status Achieved      PT LONG TERM GOAL #4   Title Pt. will demonstrate active lumbar extension in standing 100 % WFL s symptoms to facilitate upright standing, walking at PLOF.    Baseline update 04/21/2021 : 100% achieved but still limited in activity    Time 8    Period Weeks    Status Partially Met      PT LONG TERM GOAL #5   Title Pt. will demonstrate lateral flexion to knee joint s symptoms to facilitate bed mobility and bending at PLOF.    Time 10    Period Weeks    Status Achieved      Additional Long Term Goals   Additional Long Term Goals Yes      PT LONG TERM GOAL #6   Title Pt. will demonstrate Lt hip MMT equal to Rt, lumbar flexion MMT > or = 4/5 to faciitate strength for daily work activity.    Time 8    Period Weeks    Status New    Target Date 06/16/21                   Plan - 04/21/21 0809     Clinical Impression Statement Pt. has attended 9 visits overall during course of treatment.  Pt. continued to present c complaints of low back/hip pains c mobility, strength deficits which impair daily and primarily work related activity.  Pt. has demonstrated improved objective data and FOTO outcome measurement to this point. Pt. may continue to benefit from skilled PT services paired c HEP to address.  At this time, suggestion of return to MD for follow up indicated due to continued presentation  of symptoms despite mild changes symptoms to this point with work activity.  Pt. elected a trial 1-2 weeks of HEP and will call to return if desired based off symptoms.    Personal Factors and Comorbidities Comorbidity 3+    Comorbidities OA, asthma, Lt distal radius fx, deaf, DM with neuropathy, HTN,    Examination-Activity Limitations Sit;Sleep;Bed Mobility;Bend;Squat;Caring for Others;Carry;Stand;Transfers;Lift;Locomotion Level    Examination-Participation Restrictions Community Activity;Occupation;Meal Prep;Laundry;Interpersonal Relationship;Shop;Cleaning    Stability/Clinical Decision Making Stable/Uncomplicated    PT Frequency Other (comment)   1-2x/week   PT Duration 8 weeks    PT Treatment/Interventions ADLs/Self Care Home Management;Cryotherapy;Electrical Stimulation;Moist Heat;Traction;Balance training;Therapeutic exercise;Therapeutic activities;Iontophoresis 4mg /ml Dexamethasone;Functional mobility training;Stair training;Gait training;DME Instruction;Ultrasound;Neuromuscular re-education;Patient/family education;Passive range of motion;Spinal Manipulations;Joint Manipulations;Dry needling;Taping;Manual techniques    PT Next Visit Plan on next visit, continue to improve lumbar/hip mobility and overall strength/endurance for work related tasks    PT [place] and Agree with Plan of Care Patient             Patient will benefit from skilled therapeutic intervention in order to improve the following deficits  and impairments:  Decreased endurance, Hypomobility, Pain, Increased fascial restricitons, Decreased activity tolerance, Decreased mobility, Difficulty walking, Increased muscle spasms, Improper body mechanics, Impaired perceived functional ability, Impaired flexibility, Decreased range of motion  Visit Diagnosis: Chronic bilateral low back pain with bilateral sciatica  Pain in left leg  Pain in right leg  Difficulty in walking, not elsewhere  classified     Problem List Patient Active Problem List   Diagnosis Date Noted   Hemorrhoids 02/03/2021   Irritable bowel syndrome 11/22/2020   Acute viral sinusitis 11/12/2020   Bilateral sciatica 10/27/2020   Greater trochanteric bursitis of both hips 10/27/2020   Chronic idiopathic constipation 06/25/2020   Elevated blood sugar 06/10/2020   Hernia of abdominal wall 01/28/2020   Spinal stenosis at L4-L5 level 01/12/2020   Transient neurologic deficit 12/22/2019   Frequent loose stools 04/20/2019   Hyperthyroidism 03/07/2019   Hypertension 01/07/2019   Hypercalcemia 10/08/2018   Seasonal allergies 08/27/2018   Osteoarthritis involving multiple joints on both sides of body 05/25/2018   Statin intolerance 12/26/2017   PSVT (paroxysmal supraventricular tachycardia) ([place]) 11/09/2017   Chest pain 08/30/2017   Hyperlipidemia 08/30/2017   OSA (obstructive sleep apnea) 08/31/2016   Healthcare maintenance 03/07/2016   Diabetes ([place]) 09/14/2015   Muscle pain 09/28/2014   Seizure ([place]) 06/08/2014   Iron deficiency anemia 07/01/2012   Diabetic retinopathy ([place]) 07/21/2011   GERD 12/30/2007   Diabetic polyneuropathy ([place]) 08/30/2006   Type 2 diabetes mellitus with ophthalmic complication ([place]) 29/56/2130   HYPERCHOLESTEROLEMIA 08/30/2006   Hearing loss 08/30/2006    Scot Jun, PT, DPT, OCS, ATC 04/21/21  8:45 AM  PHYSICAL THERAPY DISCHARGE SUMMARY  Visits from Start of Care: 9  Current functional level related to goals / functional outcomes: See note   Remaining deficits: See note   Education / Equipment: HEP   Patient agrees to discharge. Patient goals were partially met. Patient is being discharged due to not returning since the last visit.  Scot Jun, PT, DPT, OCS, ATC 06/07/21  10:51 AM       [facility] Physical Therapy [address] [place], [place], [address] Phone: [phone]   Fax:  [phone]  Name: MC HOLLEN MRN: [id number] Date of Birth: 08/22/1955

## 2021-04-23 IMAGING — MR MR LUMBAR SPINE W/O CM
5 series · 47 of 48 positions shown · non-contrast
Comparison: None.

CLINICAL DATA: Low back pain radiating to the groin 5 years

EXAM:
MRI LUMBAR SPINE WITHOUT CONTRAST
TECHNIQUE: Multiplanar, multisequence MR imaging of the lumbar spine was
performed. No intravenous contrast was administered.

[Series 3: tirm sag · sagittal · 4.0mm · 0.55mm/px · 5 of 13 slices shown]
[im 1/13]
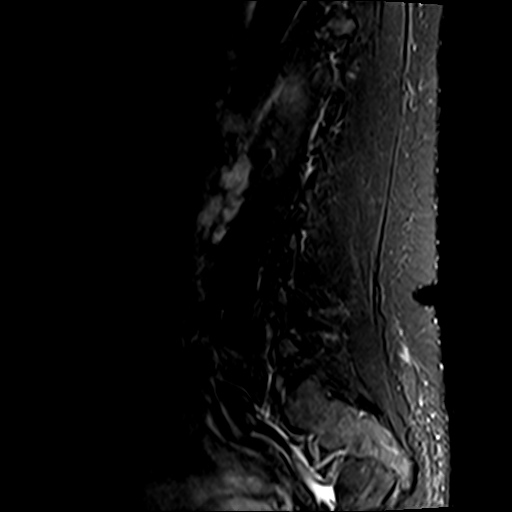
[im 4/13]
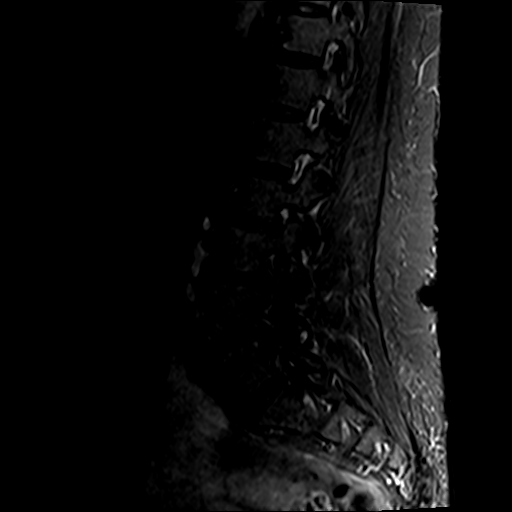
[im 7/13]
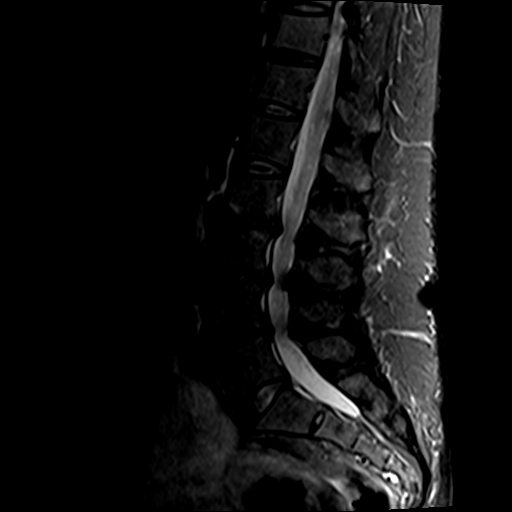
[im 10/13]
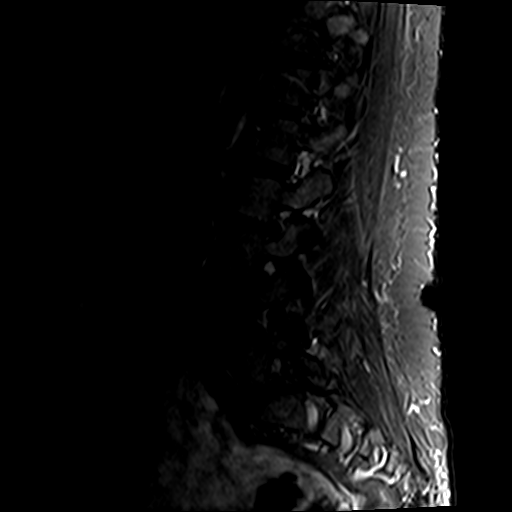
[im 13/13]
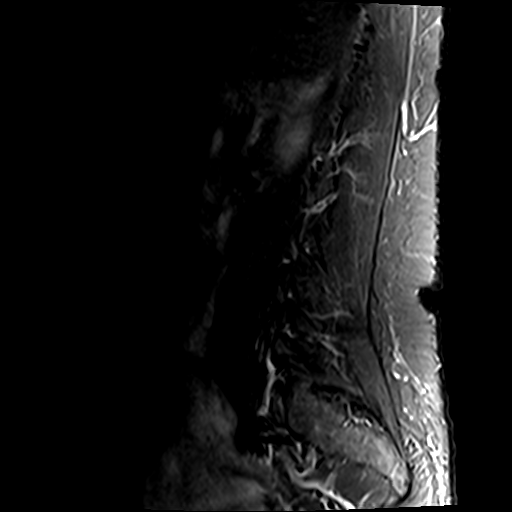

[Series 4: T2 · sagittal · 4.0mm · 0.88mm/px · 5 of 13 slices shown (1 of 2)]
[im 1/13]
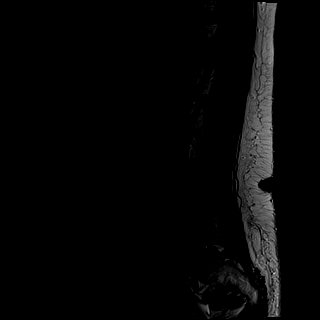
[im 4/13]
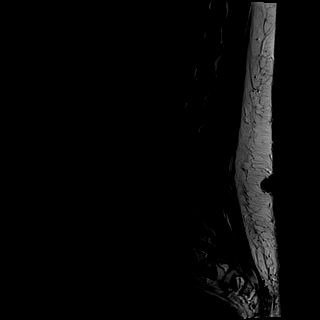
[im 7/13]
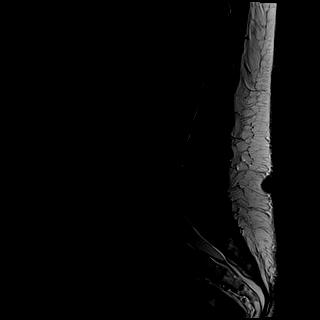
[im 10/13]
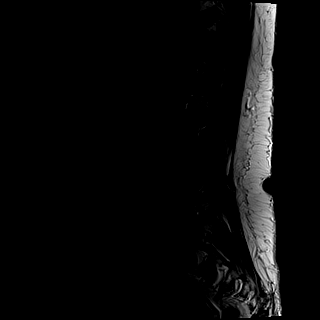
[im 13/13]
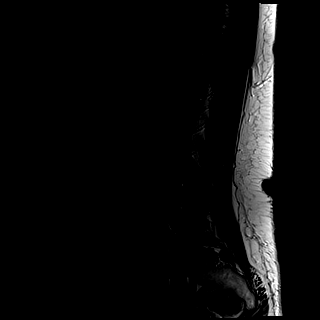

[Series 5: T1 · sagittal · 4.0mm · 0.88mm/px · 6 of 13 slices shown (1 of 2)]
[im 1/13]
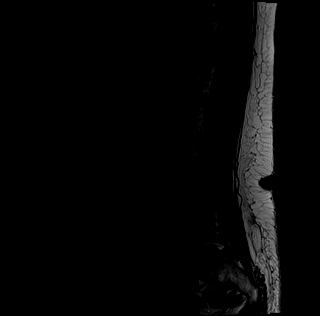
[im 3/13]
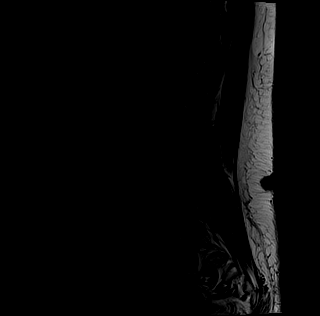
[im 5/13]
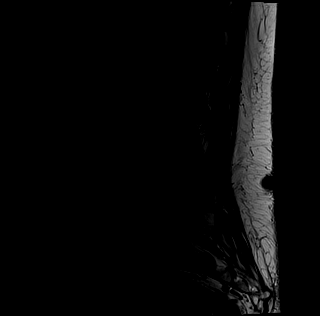
[im 8/13]
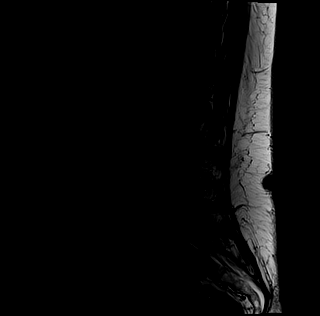
[im 10/13]
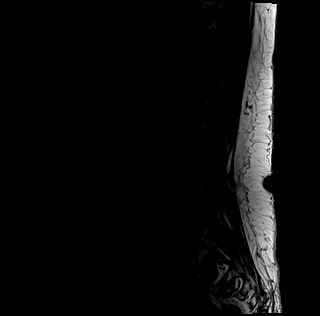
[im 13/13]
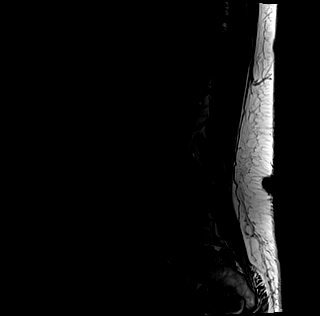

[Series 6: T1 · axial · 4.0mm · 0.78mm/px · z∈[-95,+106]mm · 15 of 37 slices shown (2 of 2)]
[im 1/37]
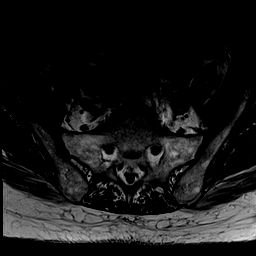
[im 3/37]
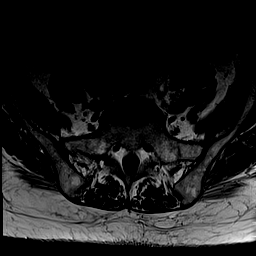
[im 5/37]
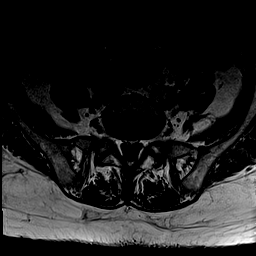
[im 8/37]
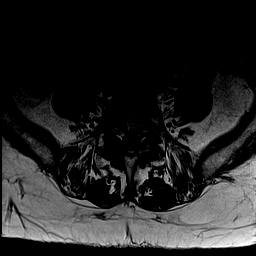
[im 10/37]
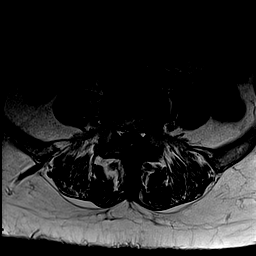
[im 13/37]
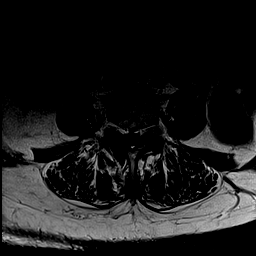
[im 15/37]
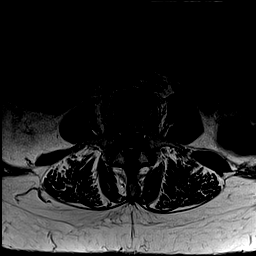
[im 17/37]
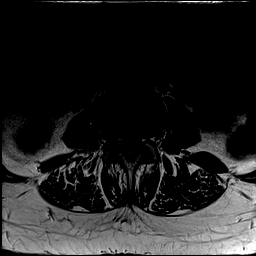
[im 20/37]
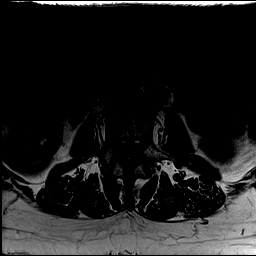
[im 22/37]
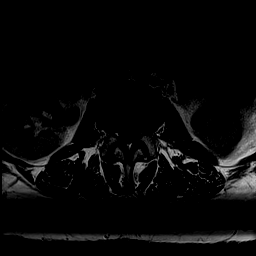
[im 25/37]
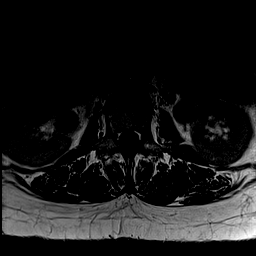
[im 27/37]
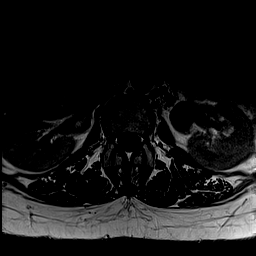
[im 29/37]
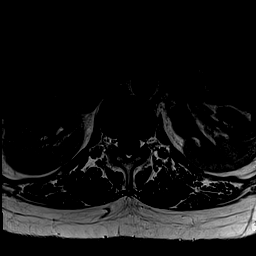
[im 32/37]
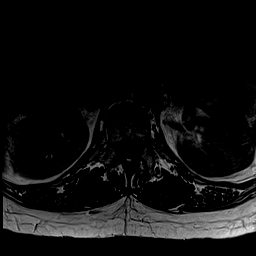
[im 37/37]
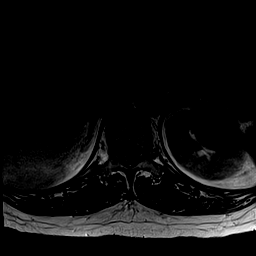

[Series 7: T2 · axial · 4.0mm · 0.78mm/px · z∈[-95,+106]mm · 16 of 37 slices shown (2 of 2)]
[im 1/37]
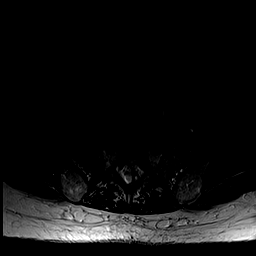
[im 3/37]
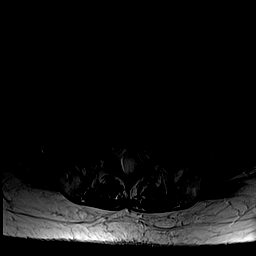
[im 5/37]
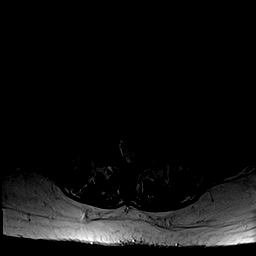
[im 8/37]
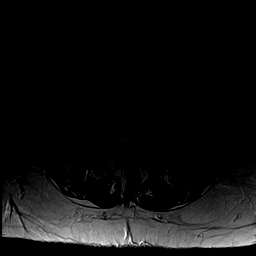
[im 10/37]
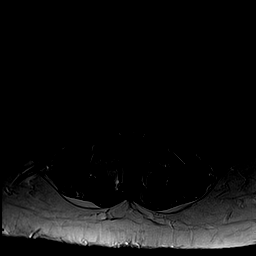
[im 13/37]
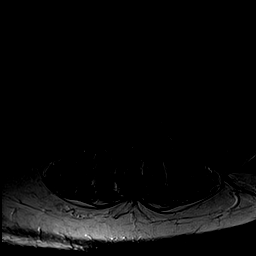
[im 15/37]
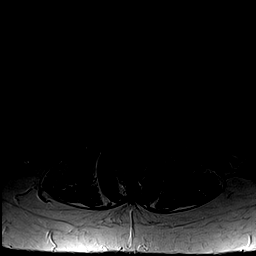
[im 17/37]
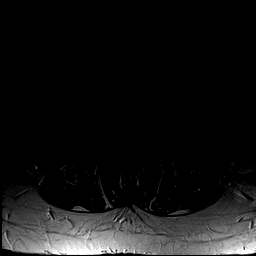
[im 20/37]
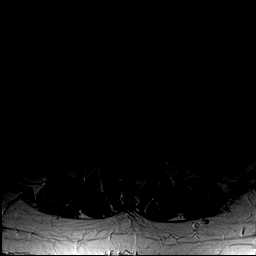
[im 22/37]
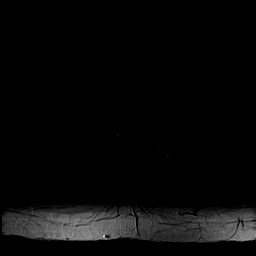
[im 25/37]
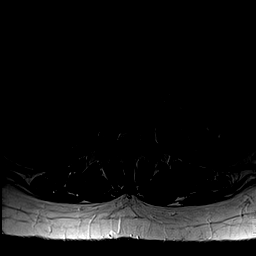
[im 27/37]
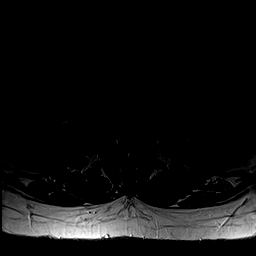
[im 29/37]
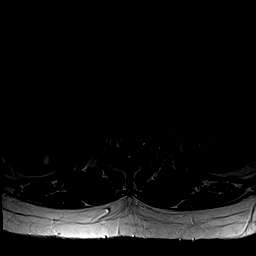
[im 32/37]
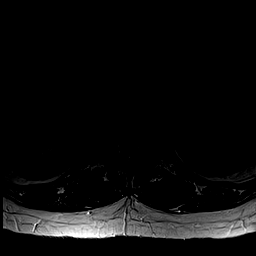
[im 34/37]
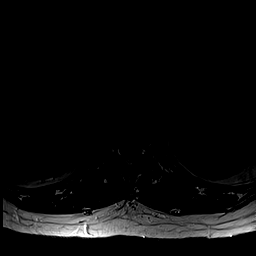
[im 37/37]
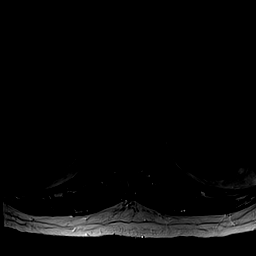

[47 of 48 positions shown; findings below may reference images not displayed]

FINDINGS: Segmentation:  Standard.

Alignment:  Grade 1 retrolisthesis at L2-3 and L3-4

Vertebrae:  No fracture, evidence of discitis, or bone lesion.

Conus medullaris and cauda equina: Conus extends to the L1 level.
Conus and cauda equina appear normal.

Paraspinal and other soft tissues: Multiple renal cysts

Disc levels:

T11-12: Normal.

T12-L1: Normal.

L1-L2: Normal disc space and facet joints. There is no spinal canal
stenosis. No neural foraminal stenosis.

L2-L3: Intermediate disc bulge. Moderate spinal canal stenosis. Mild
left neural foraminal stenosis.

L3-L4: Intermediate disc bulge. Mild facet hypertrophy with
ligamentum flavum redundancy. Severe spinal canal stenosis. Moderate
right and mild left neural foraminal stenosis.

L4-L5: Small disc bulge and mild facet hypertrophy. Mild spinal
canal stenosis. No neural foraminal stenosis.

L5-S1: Normal disc space and facet joints. There is no spinal canal
stenosis. No neural foraminal stenosis.

Visualized sacrum: Normal.
IMPRESSION: 1. L3-L4 severe spinal canal stenosis with moderate right and mild
left neural foraminal stenosis.
2. L2-L3 moderate spinal canal stenosis and mild left neural
foraminal stenosis.
3. L4-L5 mild spinal canal stenosis.

## 2021-04-30 ENCOUNTER — Other Ambulatory Visit: Payer: Self-pay | Admitting: Family Medicine

## 2021-05-06 DIAGNOSIS — Z23 Encounter for immunization: Secondary | ICD-10-CM | POA: Diagnosis not present

## 2021-05-13 ENCOUNTER — Ambulatory Visit (INDEPENDENT_AMBULATORY_CARE_PROVIDER_SITE_OTHER): Payer: Medicare Other | Admitting: Family Medicine

## 2021-05-13 ENCOUNTER — Other Ambulatory Visit: Payer: Self-pay

## 2021-05-13 ENCOUNTER — Encounter: Payer: Self-pay | Admitting: Family Medicine

## 2021-05-13 VITALS — BP 160/91 | HR 89 | Ht 64.0 in | Wt 131.2 lb

## 2021-05-13 DIAGNOSIS — E11311 Type 2 diabetes mellitus with unspecified diabetic retinopathy with macular edema: Secondary | ICD-10-CM | POA: Diagnosis not present

## 2021-05-13 DIAGNOSIS — R3911 Hesitancy of micturition: Secondary | ICD-10-CM | POA: Insufficient documentation

## 2021-05-13 DIAGNOSIS — I10 Essential (primary) hypertension: Secondary | ICD-10-CM | POA: Diagnosis not present

## 2021-05-13 LAB — POCT URINALYSIS DIP (MANUAL ENTRY)
Bilirubin, UA: NEGATIVE
Blood, UA: NEGATIVE
Glucose, UA: NEGATIVE mg/dL
Ketones, POC UA: NEGATIVE mg/dL
Leukocytes, UA: NEGATIVE
Nitrite, UA: NEGATIVE
Protein Ur, POC: NEGATIVE mg/dL
Spec Grav, UA: 1.01 (ref 1.010–1.025)
Urobilinogen, UA: 0.2 E.U./dL
pH, UA: 5.5 (ref 5.0–8.0)

## 2021-05-13 MED ORDER — HYDROCHLOROTHIAZIDE 12.5 MG PO CAPS: 12.5000 mg | ORAL_CAPSULE | Freq: Every day | ORAL | 0 refills | Status: DC

## 2021-05-13 NOTE — Patient Instructions (Addendum)
For your elevated blood pressure, I have added your microzide 12.5mg . Please schedule a follow up visit in one week for a blood pressure check.   If you experience worsening headaches, blurry vision or develop chest pain, it will be important to be evaluated in the ED ASAP.

## 2021-05-13 NOTE — Assessment & Plan Note (Signed)
Hypertensive today Will add microzide and check bmp  Patient to follow up in 1 week

## 2021-05-13 NOTE — Assessment & Plan Note (Signed)
Will collect UA

## 2021-05-13 NOTE — Progress Notes (Signed)
    SUBJECTIVE:   CHIEF COMPLAINT / HPI: 1 mo DM f/u   Diabetes Current Regimen: novolog, lantus CBGs: had two times with 300, reports less episodes of lower BG levels, when she does not eat she feels more tired so she eats to keep her energy up   Last A1c:  Lab Results  Component Value Date   HGBA1C 8.2 (A) 03/10/2021    Denies polyuria, polydipsia  Last Eye Exam:  Statin: yes ACE/ARB: yes  HTN  Patient with elevated BP at 165/81 despite taking her antihypertensives  Started to have HA at midnight, unable to take BP when this started  She has taken tylenol without much change  HA has been present all morning  She reports she was told to stop HCTZ by Dr. Valentina Lucks   She reports being told she has bladder stones from previous imaging with her spine doctor. She states that she often has the urge to urinate but has a hard time gettingthe urine to flow. She denies pain with urination. She reports an episode of right sided back pain that had sudden onset this morning and then sauddently resolved. She wonders if this could be related to her kidney function.   PERTINENT  PMH / PSH:  HTN  DM    OBJECTIVE:   BP (!) 165/81   Pulse 89   Ht 5\' 4"  (1.626 m)   Wt 131 lb 3.2 oz (59.5 kg)   SpO2 100%   BMI 22.52 kg/m   General: female appearing stated age in no acute distress Cardio: Normal S1 and S2, no S3 or S4. Rhythm is regular. No murmurs or rubs.  Bilateral radial pulses palpable Pulm: Clear to auscultation bilaterally, no crackles, wheezing, or diminished breath sounds. Normal respiratory effort, stable on RA Abdomen: Bowel sounds normal. Abdomen soft and non-tender. Right thoracic and lumbar regions without erythema or swelling, minimal tenderness to palpation no CVA tenderness  Extremities: No peripheral edema. Warm/ well perfused.  Neuro: pt alert and oriented x4, EOMI bilaterally, evidence of lens procedure in right eye, left pupil reactive to light, no scleral icterus, 5/5  strength in bilateral upper and lower extremities    ASSESSMENT/PLAN:   Hypertension Hypertensive today Will add microzide and check bmp  Patient to follow up in 1 week    Type 2 diabetes mellitus with ophthalmic complication ([place]) Will continue with current regimen  Plan to check hgb a1c Dec 2022  Urinary hesitancy Will collect UA     Eulis Foster, MD [place]

## 2021-05-13 NOTE — Assessment & Plan Note (Signed)
Will continue with current regimen  Plan to check hgb a1c Dec 2022

## 2021-05-14 ENCOUNTER — Other Ambulatory Visit: Payer: Self-pay | Admitting: Family Medicine

## 2021-05-14 DIAGNOSIS — M159 Polyosteoarthritis, unspecified: Secondary | ICD-10-CM

## 2021-05-14 LAB — BASIC METABOLIC PANEL
BUN/Creatinine Ratio: 23 (ref 12–28)
BUN: 16 mg/dL (ref 8–27)
CO2: 28 mmol/L (ref 20–29)
Calcium: 10.8 mg/dL — ABNORMAL HIGH (ref 8.7–10.3)
Chloride: 100 mmol/L (ref 96–106)
Creatinine, Ser: 0.71 mg/dL (ref 0.57–1.00)
Glucose: 59 mg/dL — ABNORMAL LOW (ref 70–99)
Potassium: 4.8 mmol/L (ref 3.5–5.2)
Sodium: 142 mmol/L (ref 134–144)
eGFR: 94 mL/min/{1.73_m2} (ref >59)

## 2021-05-16 ENCOUNTER — Other Ambulatory Visit: Payer: Self-pay | Admitting: Family Medicine

## 2021-05-16 DIAGNOSIS — E119 Type 2 diabetes mellitus without complications: Secondary | ICD-10-CM

## 2021-05-16 DIAGNOSIS — H3582 Retinal ischemia: Secondary | ICD-10-CM | POA: Diagnosis not present

## 2021-05-16 DIAGNOSIS — E113593 Type 2 diabetes mellitus with proliferative diabetic retinopathy without macular edema, bilateral: Secondary | ICD-10-CM | POA: Diagnosis not present

## 2021-05-16 DIAGNOSIS — H35373 Puckering of macula, bilateral: Secondary | ICD-10-CM | POA: Diagnosis not present

## 2021-05-16 DIAGNOSIS — H4312 Vitreous hemorrhage, left eye: Secondary | ICD-10-CM | POA: Diagnosis not present

## 2021-05-20 ENCOUNTER — Ambulatory Visit: Payer: Medicare Other | Admitting: Family Medicine

## 2021-05-25 ENCOUNTER — Ambulatory Visit
Admission: RE | Admit: 2021-05-25 | Discharge: 2021-05-25 | Disposition: A | Discharge status: Home / Self Care | Payer: Medicare Other | Source: Ambulatory Visit | Attending: Family Medicine | Admitting: Family Medicine

## 2021-05-25 DIAGNOSIS — Z1231 Encounter for screening mammogram for malignant neoplasm of breast: Secondary | ICD-10-CM

## 2021-05-27 ENCOUNTER — Other Ambulatory Visit: Payer: Self-pay | Admitting: Family Medicine

## 2021-05-27 DIAGNOSIS — Z794 Long term (current) use of insulin: Secondary | ICD-10-CM

## 2021-05-27 DIAGNOSIS — E118 Type 2 diabetes mellitus with unspecified complications: Secondary | ICD-10-CM

## 2021-05-27 DIAGNOSIS — E119 Type 2 diabetes mellitus without complications: Secondary | ICD-10-CM

## 2021-05-30 ENCOUNTER — Other Ambulatory Visit: Payer: Self-pay | Admitting: Family Medicine

## 2021-05-30 DIAGNOSIS — Z794 Long term (current) use of insulin: Secondary | ICD-10-CM

## 2021-05-30 DIAGNOSIS — E118 Type 2 diabetes mellitus with unspecified complications: Secondary | ICD-10-CM

## 2021-06-02 ENCOUNTER — Other Ambulatory Visit: Payer: Self-pay | Admitting: Physician Assistant

## 2021-06-02 ENCOUNTER — Ambulatory Visit: Payer: Medicare Other | Admitting: Family Medicine

## 2021-06-02 DIAGNOSIS — R109 Unspecified abdominal pain: Secondary | ICD-10-CM

## 2021-06-02 DIAGNOSIS — D509 Iron deficiency anemia, unspecified: Secondary | ICD-10-CM | POA: Diagnosis not present

## 2021-06-02 DIAGNOSIS — R634 Abnormal weight loss: Secondary | ICD-10-CM | POA: Diagnosis not present

## 2021-06-02 DIAGNOSIS — R1084 Generalized abdominal pain: Secondary | ICD-10-CM | POA: Diagnosis not present

## 2021-06-02 DIAGNOSIS — K589 Irritable bowel syndrome without diarrhea: Secondary | ICD-10-CM | POA: Diagnosis not present

## 2021-06-02 DIAGNOSIS — R197 Diarrhea, unspecified: Secondary | ICD-10-CM | POA: Diagnosis not present

## 2021-06-06 ENCOUNTER — Other Ambulatory Visit: Payer: Self-pay

## 2021-06-06 ENCOUNTER — Ambulatory Visit
Admission: RE | Admit: 2021-06-06 | Discharge: 2021-06-06 | Disposition: A | Discharge status: Home / Self Care | Payer: Medicare Other | Source: Ambulatory Visit | Attending: Physician Assistant | Admitting: Physician Assistant

## 2021-06-06 DIAGNOSIS — R109 Unspecified abdominal pain: Secondary | ICD-10-CM

## 2021-06-06 DIAGNOSIS — R140 Abdominal distension (gaseous): Secondary | ICD-10-CM | POA: Diagnosis not present

## 2021-06-06 DIAGNOSIS — R197 Diarrhea, unspecified: Secondary | ICD-10-CM | POA: Diagnosis not present

## 2021-06-06 DIAGNOSIS — Z794 Long term (current) use of insulin: Secondary | ICD-10-CM

## 2021-06-06 DIAGNOSIS — E118 Type 2 diabetes mellitus with unspecified complications: Secondary | ICD-10-CM

## 2021-06-06 DIAGNOSIS — N200 Calculus of kidney: Secondary | ICD-10-CM | POA: Diagnosis not present

## 2021-06-06 DIAGNOSIS — R110 Nausea: Secondary | ICD-10-CM | POA: Diagnosis not present

## 2021-06-06 MED ORDER — ACCU-CHEK AVIVA PLUS VI STRP: ORAL_STRIP | 12 refills | Status: DC

## 2021-06-06 MED ORDER — IOPAMIDOL (ISOVUE-300) INJECTION 61%
100.0000 mL | Freq: Once | INTRAVENOUS | Status: AC | PRN
  Administered 2021-06-06: 100 mL via INTRAVENOUS

## 2021-06-06 NOTE — Telephone Encounter (Signed)
Patient calls nurse line reporting problems picking up test strips. I called the pharmacy and (1) an attending needs to send in and (2) the frequency and dx code needs to be present.   I have resent prescription under original attendings name.

## 2021-06-06 NOTE — Addendum Note (Signed)
Addended by: Dorna Bloom on: 06/06/2021 03:36 PM   Modules accepted: Orders

## 2021-06-07 ENCOUNTER — Other Ambulatory Visit: Payer: Self-pay | Admitting: Family Medicine

## 2021-06-09 ENCOUNTER — Ambulatory Visit: Payer: Medicare Other | Admitting: Family Medicine

## 2021-06-09 ENCOUNTER — Other Ambulatory Visit: Payer: Self-pay | Admitting: Family Medicine

## 2021-06-09 ENCOUNTER — Telehealth: Payer: Self-pay

## 2021-06-09 DIAGNOSIS — E113593 Type 2 diabetes mellitus with proliferative diabetic retinopathy without macular edema, bilateral: Secondary | ICD-10-CM | POA: Diagnosis not present

## 2021-06-09 DIAGNOSIS — H35371 Puckering of macula, right eye: Secondary | ICD-10-CM | POA: Diagnosis not present

## 2021-06-09 MED ORDER — LIRAGLUTIDE 18 MG/3ML ~~LOC~~ SOPN: 1.8000 mg | PEN_INJECTOR | Freq: Every day | SUBCUTANEOUS | 3 refills | Status: DC

## 2021-06-09 NOTE — Telephone Encounter (Signed)
Rx updated to reflect 1.8mg  dosing. Prescription with refills sent to pharmacy

## 2021-06-09 NOTE — Telephone Encounter (Signed)
Patient presents to clinic asking to speak with nurse regarding issues with glucose test strips.   I called and spoke with [facility] pharmacy. Reports that they were still running rx under Dr. Alba Cory. Rx was sent on 12/5 under Dr. Owens Shark. They were then able to process the prescription for $3.33.   Patient also expressed concerns with Victoza prescription. Patient reports that she is taking prescription 1.8 mg daily, however, current rx is for 1.2 mg daily. Due to this, patient is out of medication and insurance will not pay for medication at 1.2 mg dosing until 12/16.  Per note on 03/10/21 patients diabetes regimen is as follows.   Continue Basaglar 10 units Continue NovoLog 2 to 4 units with meals Continue metformin Continue Victoza 1.8 mg.   Note from 11/11 states to continue current medication regimen.   Will forward to PCP to clarify dosing. Patient will need a new rx sent to the pharmacy with updated dosing and instructions.   Patient also states some confusion with appointment for today. Patient had called yesterday and canceled appointment. Advised that I would schedule for next available appointment. Scheduled with Dr. Nita Sells on 12/19 for DM follow up.   Talbot Grumbling, RN

## 2021-06-09 NOTE — Progress Notes (Signed)
Victoza dosing updated to reflect correct dose of 1.8mg . Rx resent to pharmacy.

## 2021-06-13 ENCOUNTER — Other Ambulatory Visit: Payer: Self-pay | Admitting: Family Medicine

## 2021-06-13 DIAGNOSIS — I10 Essential (primary) hypertension: Secondary | ICD-10-CM

## 2021-06-19 NOTE — Patient Instructions (Addendum)
It was wonderful to see you today.  Please bring ALL of your medications with you to every visit.   Today we talked about:  -Your A1c today was 7.1, improvement from the last one that was 8.3.  -Keep up the great work! No medication changes at this time. -Return in 3 months for a follow up -For your back pain, please take the baclofen up to 3 times a day as needed.  -Continue your back exercises!  -We are doing lab work today to check your cholesterol. I will send you a MyChart message if you have MyChart.  If you don't hear from me in 2 weeks, please call the office.     Thank you for choosing [facility].   Please call [phone] with any questions about today's appointment.  Please be sure to schedule follow up at the front  desk before you leave today.   Sharion Settler, DO PGY-2 Family Medicine    Back Exercises The following exercises strengthen the muscles that help to support the trunk (torso) and back. They also help to keep the lower back flexible. Doing these exercises can help to prevent or lessen existing low back pain. If you have back pain or discomfort, try doing these exercises 2-3 times each day or as told by your health care provider. As your pain improves, do them once each day, but increase the number of times that you repeat the steps for each exercise (do more repetitions). To prevent the recurrence of back pain, continue to do these exercises once each day or as told by your health care provider. Do exercises exactly as told by your health care provider and adjust them as directed. It is normal to feel mild stretching, pulling, tightness, or discomfort as you do these exercises, but you should stop right away if you feel sudden pain or your pain gets worse. Exercises Single knee to chest Repeat these steps 3-5 times for each leg: Lie on your back on a firm bed or the floor with your legs extended. Bring one knee to your chest. Your other  leg should stay extended and in contact with the floor. Hold your knee in place by grabbing your knee or thigh with both hands and hold. Pull on your knee until you feel a gentle stretch in your lower back or buttocks. Hold the stretch for 10-30 seconds. Slowly release and straighten your leg.  Pelvic tilt Repeat these steps 5-10 times: Lie on your back on a firm bed or the floor with your legs extended. Bend your knees so they are pointing toward the ceiling and your feet are flat on the floor. Tighten your lower abdominal muscles to press your lower back against the floor. This motion will tilt your pelvis so your tailbone points up toward the ceiling instead of pointing to your feet or the floor. With gentle tension and even breathing, hold this position for 5-10 seconds.  Cat-cow Repeat these steps until your lower back becomes more flexible: Get into a hands-and-knees position on a firm bed or the floor. Keep your hands under your shoulders, and keep your knees under your hips. You may place padding under your knees for comfort. Let your head hang down toward your chest. Contract your abdominal muscles and point your tailbone toward the floor so your lower back becomes rounded like the back of a cat. Hold this position for 5 seconds. Slowly lift your head, let your abdominal muscles relax, and point your  tailbone up toward the ceiling so your back forms a sagging arch like the back of a cow. Hold this position for 5 seconds.  Press-ups Repeat these steps 5-10 times: Lie on your abdomen (face-down) on a firm bed or the floor. Place your palms near your head, about shoulder-width apart. Keeping your back as relaxed as possible and keeping your hips on the floor, slowly straighten your arms to raise the top half of your body and lift your shoulders. Do not use your back muscles to raise your upper torso. You may adjust the placement of your hands to make yourself more comfortable. Hold  this position for 5 seconds while you keep your back relaxed. Slowly return to lying flat on the floor.  Bridges Repeat these steps 10 times: Lie on your back on a firm bed or the floor. Bend your knees so they are pointing toward the ceiling and your feet are flat on the floor. Your arms should be flat at your sides, next to your body. Tighten your buttocks muscles and lift your buttocks off the floor until your waist is at almost the same height as your knees. You should feel the muscles working in your buttocks and the back of your thighs. If you do not feel these muscles, slide your feet 1-2 inches (2.5-5 cm) farther away from your buttocks. Hold this position for 3-5 seconds. Slowly lower your hips to the starting position, and allow your buttocks muscles to relax completely. If this exercise is too easy, try doing it with your arms crossed over your chest. Abdominal crunches Repeat these steps 5-10 times: Lie on your back on a firm bed or the floor with your legs extended. Bend your knees so they are pointing toward the ceiling and your feet are flat on the floor. Cross your arms over your chest. Tip your chin slightly toward your chest without bending your neck. Tighten your abdominal muscles and slowly raise your torso high enough to lift your shoulder blades a tiny bit off the floor. Avoid raising your torso higher than that because it can put too much stress on your lower back and does not help to strengthen your abdominal muscles. Slowly return to your starting position.  Back lifts Repeat these steps 5-10 times: Lie on your abdomen (face-down) with your arms at your sides, and rest your forehead on the floor. Tighten the muscles in your legs and your buttocks. Slowly lift your chest off the floor while you keep your hips pressed to the floor. Keep the back of your head in line with the curve in your back. Your eyes should be looking at the floor. Hold this position for 3-5  seconds. Slowly return to your starting position.  Contact a health care provider if: Your back pain or discomfort gets much worse when you do an exercise. Your worsening back pain or discomfort does not lessen within 2 hours after you exercise. If you have any of these problems, stop doing these exercises right away. Do not do them again unless your health care provider says that you can. Get help right away if: You develop sudden, severe back pain. If this happens, stop doing the exercises right away. Do not do them again unless your health care provider says that you can. This information is not intended to replace advice given to you by your health care provider. Make sure you discuss any questions you have with your health care provider. Document Revised: 12/14/2020 Document Reviewed: 09/01/2020 Elsevier  Patient Education © 2022 [employer]vier Inc. ° °

## 2021-06-19 NOTE — Progress Notes (Signed)
SUBJECTIVE:   CHIEF COMPLAINT / HPI:   In person sign-language interpreter, Elta Guadel[place], present for entirety of visit.  Type 2 DM: Follow Up  - Last A1c 8.2 on 03/10/21 - Medications: Currently taking Victoza 1.8 mg, Metformin 1000 mg BID, Basaglar 10U, Novolog 2-4U with meals  - Compliance: Good - Checking BG at home: States checks every day. Before bed, before dinner and before breakfast. This AM it was 90. Most >80.  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 - Eye exam: States that she has been getting injections into her left eye every 2 weeks.  - Foot exam: Due  - Statin: On Rosuvastatin 5 mg. LDL 104 in April 2022. Goal <70 - Denies symptoms of hypoglycemia, polyuria, polydipsia, numbness extremities, foot ulcers/trauma  The 10-year ASCVD risk score (Arnett DK, et al., 2019) is: 26.5%   Values used to calculate the score:     Age: 65 years     Sex: Female     Is Non-Hispanic African American: Yes     Diabetic: Yes     Tobacco smoker: No     Systolic Blood Pressure: 852 mmHg     Is BP treated: Yes     HDL Cholesterol: 88 mg/dL     Total Cholesterol: 200 mg/dL  Back Pain  Patient notes that she was doing her laundry on Saturday when she noticed sudden right-sided back pain.  Since then she has been having trouble twisting to the left and lying on her back.  At the time she had a 10 out of 10 pain, states that she almost went to the ED for it.  The pain does not radiate down to her legs.  She has tried Tylenol for the pain which only helps mildly.  No numbness or tingling.  Notes that she has had back issues in the past and used to get injections to her back.  She tried physical therapy in the past as well and frequently does back exercises at home.  PERTINENT  PMH / PSH:  Past Medical History:  Diagnosis Date   Anemia    ARTHRITIS, KNEE 09/17/2007   Asthma 04/04/2010   Cataract 01/07/2019   Closed fracture of distal end of left radius 11/01/2015    Complete deafness    Meningitis at age 23   Deaf    Diabetes mellitus    Diabetic neuropathy ([place])    Ganglion cyst 06/22/2011   Gastroparesis    GERD (gastroesophageal reflux disease)    H/O: C-section    Hyperlipidemia    Hypertension    Neuromuscular disorder ([place])    diabetic neuropathy   PSVT (paroxysmal supraventricular tachycardia) ([place]) 11/09/2017   Event monitor 09/14/2017 - Predominantly sinus rhythm with episodes of narrow-complex tachycardia suggestive of paroxysmal supraventricular tachycardia.   S/P appy     OBJECTIVE:   BP 140/73    Pulse 82    Wt 135 lb 6.4 oz (61.4 kg)    SpO2 100%    BMI 23.24 kg/m    General: NAD, pleasant, able to participate in exam Respiratory: Breathing comfortably on room air Foot exam: No deformities, ulcerations, or other skin breakdown on feet bilaterally.  Sensation intact to monofilament and light touch.  PT and DP pulses intact BL.   Back: Tenderness to lumbar spine and across right side of low back without any deformities appreciated.  Normal gait.  No increased tissue texture changes.  Negative straight leg  raise bilaterally. Normal hip flexion and extension b/l Psych: Normal affect and mood  ASSESSMENT/PLAN:   Type 2 diabetes mellitus with ophthalmic complication ([facility]) Hgb E9H 7.1, which is improvement from previous A1c of 8.2.  Patient is compliant with her medications.  No hypoglycemic symptoms. Given improvement in A1c will not adjust medications at this time. Plan:  -Follow up in 3 months for repeat A1c -Foot examination completed today  -Patient up-to-date with eye examination  -No medication changes today -Rosuvastatin should be increased to 20 mg daily for high-intensity dosage given ASCVD risk  -Continue ARB  Hypertension BP mildly elevated at 140/73 today. Was unable to recheck. Current medications include valsartan, carvedilol, diltiazem, HCTZ. HCTZ may be contributing to her hypercalcemia.  -Continue checking home  blood pressures -Can readdress at follow-up  Back pain No red flag symptoms.  Has pain on palpation to lower lumbar spine that radiates across the right paraspinal muscles.  No deformities.  She is able to walk without much difficulty but did have significant difficulty laying down. -Short course of baclofen prescribed, patient to use 3 times a day as needed for muscle spasms -Provided her with a list of back exercises to use, we discussed how physical therapy could help her pain      Sharion Settler, [place]

## 2021-06-20 ENCOUNTER — Ambulatory Visit (INDEPENDENT_AMBULATORY_CARE_PROVIDER_SITE_OTHER): Payer: Medicare Other | Admitting: Family Medicine

## 2021-06-20 ENCOUNTER — Encounter: Payer: Self-pay | Admitting: Family Medicine

## 2021-06-20 ENCOUNTER — Other Ambulatory Visit: Payer: Self-pay

## 2021-06-20 VITALS — BP 140/73 | HR 82 | Wt 135.4 lb

## 2021-06-20 DIAGNOSIS — E11311 Type 2 diabetes mellitus with unspecified diabetic retinopathy with macular edema: Secondary | ICD-10-CM

## 2021-06-20 DIAGNOSIS — I10 Essential (primary) hypertension: Secondary | ICD-10-CM | POA: Diagnosis not present

## 2021-06-20 DIAGNOSIS — M545 Low back pain, unspecified: Secondary | ICD-10-CM | POA: Diagnosis not present

## 2021-06-20 DIAGNOSIS — M549 Dorsalgia, unspecified: Secondary | ICD-10-CM | POA: Insufficient documentation

## 2021-06-20 LAB — POCT GLYCOSYLATED HEMOGLOBIN (HGB A1C): HbA1c, POC (controlled diabetic range): 7.1 % — AB (ref 0.0–7.0)

## 2021-06-20 MED ORDER — BACLOFEN 10 MG PO TABS: 10.0000 mg | ORAL_TABLET | Freq: Three times a day (TID) | ORAL | 0 refills | Status: DC | PRN

## 2021-06-20 NOTE — Assessment & Plan Note (Signed)
Hgb A1c 7.1, which is improvement from previous A1c of 8.2.  Patient is compliant with her medications.  No hypoglycemic symptoms. Given improvement in A1c will not adjust medications at this time. Plan:  -Follow up in 3 months for repeat A1c -Foot examination completed today  -Patient up-to-date with eye examination  -No medication changes today -Rosuvastatin should be increased to 20 mg daily for high-intensity dosage given ASCVD risk  -Continue ARB

## 2021-06-20 NOTE — Assessment & Plan Note (Signed)
No red flag symptoms.  Has pain on palpation to lower lumbar spine that radiates across the right paraspinal muscles.  No deformities.  She is able to walk without much difficulty but did have significant difficulty laying down. -Short course of baclofen prescribed, patient to use 3 times a day as needed for muscle spasms -Provided her with a list of back exercises to use, we discussed how physical therapy could help her pain

## 2021-06-20 NOTE — Assessment & Plan Note (Signed)
BP mildly elevated at 140/73 today. Was unable to recheck. Current medications include valsartan, carvedilol, diltiazem, HCTZ. HCTZ may be contributing to her hypercalcemia.  -Continue checking home blood pressures -Can readdress at follow-up

## 2021-06-29 ENCOUNTER — Other Ambulatory Visit: Payer: Self-pay | Admitting: Physician Assistant

## 2021-06-29 DIAGNOSIS — R9389 Abnormal findings on diagnostic imaging of other specified body structures: Secondary | ICD-10-CM

## 2021-06-29 DIAGNOSIS — N281 Cyst of kidney, acquired: Secondary | ICD-10-CM

## 2021-07-01 ENCOUNTER — Ambulatory Visit
Admission: RE | Admit: 2021-07-01 | Discharge: 2021-07-01 | Disposition: A | Discharge status: Home / Self Care | Payer: Medicare Other | Source: Ambulatory Visit | Attending: Physician Assistant | Admitting: Physician Assistant

## 2021-07-01 DIAGNOSIS — K820 Obstruction of gallbladder: Secondary | ICD-10-CM | POA: Diagnosis not present

## 2021-07-01 DIAGNOSIS — N281 Cyst of kidney, acquired: Secondary | ICD-10-CM

## 2021-07-01 DIAGNOSIS — R9389 Abnormal findings on diagnostic imaging of other specified body structures: Secondary | ICD-10-CM

## 2021-07-01 DIAGNOSIS — D259 Leiomyoma of uterus, unspecified: Secondary | ICD-10-CM | POA: Diagnosis not present

## 2021-07-01 DIAGNOSIS — M47816 Spondylosis without myelopathy or radiculopathy, lumbar region: Secondary | ICD-10-CM | POA: Diagnosis not present

## 2021-07-01 MED ORDER — GADOBENATE DIMEGLUMINE 529 MG/ML IV SOLN
12.0000 mL | Freq: Once | INTRAVENOUS | Status: AC | PRN
  Administered 2021-07-01: 12 mL via INTRAVENOUS

## 2021-07-19 DIAGNOSIS — D509 Iron deficiency anemia, unspecified: Secondary | ICD-10-CM | POA: Diagnosis not present

## 2021-07-22 ENCOUNTER — Ambulatory Visit (INDEPENDENT_AMBULATORY_CARE_PROVIDER_SITE_OTHER): Payer: Medicare Other | Admitting: Podiatry

## 2021-07-22 ENCOUNTER — Other Ambulatory Visit: Payer: Self-pay

## 2021-07-22 DIAGNOSIS — L603 Nail dystrophy: Secondary | ICD-10-CM | POA: Diagnosis not present

## 2021-07-22 DIAGNOSIS — E1149 Type 2 diabetes mellitus with other diabetic neurological complication: Secondary | ICD-10-CM

## 2021-07-22 DIAGNOSIS — B351 Tinea unguium: Secondary | ICD-10-CM

## 2021-07-22 DIAGNOSIS — M79675 Pain in left toe(s): Secondary | ICD-10-CM

## 2021-07-22 DIAGNOSIS — A499 Bacterial infection, unspecified: Secondary | ICD-10-CM | POA: Diagnosis not present

## 2021-07-22 DIAGNOSIS — M79674 Pain in right toe(s): Secondary | ICD-10-CM

## 2021-07-22 NOTE — Patient Instructions (Signed)

## 2021-07-25 ENCOUNTER — Other Ambulatory Visit: Payer: Self-pay | Admitting: Family Medicine

## 2021-07-25 NOTE — Progress Notes (Signed)
Subjective: 66 year old female presents the office today for concerns of her big toenail becoming more thick and discolored over the last year most on her right foot.  She notes of brown discoloration.  She is current fungus.  Some burning she was having previously is almost resolved.  She gets similar on the big toe but not impacting walking or activity.  No recent injuries that she reports.  No open lesions.  Her last A1c was 7.1 which has come down form 8.2.   Objective: AAO x3, NAD-presents with interpreter DP/PT pulses palpable bilaterally, CRT less than 3 seconds Nails appear to be hypertrophic, dystrophic with yellow-brown discoloration.  No edema, erythema to the toenail sites.  Tenderness nails 1-5 bilaterally.  No open lesions bilaterally.   No pain with calf compression, swelling, warmth, erythema  Assessment: Symptomatic onychomycosis  Plan: -All treatment options discussed with the patient including all alternatives, risks, complications.  -Nails sharply debrided x10 without any complications or bleeding.  I sent the nail for culture, pathology to [facility] before proceeding with further treatment of the toenails.  -Patient encouraged to call the office with any questions, concerns, change in symptoms.   Trula Slade DPM

## 2021-07-26 DIAGNOSIS — Q6100 Congenital renal cyst, unspecified: Secondary | ICD-10-CM | POA: Diagnosis not present

## 2021-07-26 DIAGNOSIS — K5900 Constipation, unspecified: Secondary | ICD-10-CM | POA: Diagnosis not present

## 2021-07-26 DIAGNOSIS — R197 Diarrhea, unspecified: Secondary | ICD-10-CM | POA: Diagnosis not present

## 2021-07-26 DIAGNOSIS — R634 Abnormal weight loss: Secondary | ICD-10-CM | POA: Diagnosis not present

## 2021-07-26 DIAGNOSIS — D649 Anemia, unspecified: Secondary | ICD-10-CM | POA: Diagnosis not present

## 2021-07-30 ENCOUNTER — Other Ambulatory Visit: Payer: Self-pay | Admitting: Family Medicine

## 2021-08-02 ENCOUNTER — Telehealth: Payer: Self-pay

## 2021-08-02 NOTE — Telephone Encounter (Signed)
Patient calls nurse line reporting continued neck and back pain. Patient was seen in December and prescribed Baclofen. Patient reports she has been taking Baclofen and Tylenol with minimal relief. Patient denies any known injury. Patient denies numbness or tingling sensation.   Patient schedule for evaluation on 2/3.  Red flags discussed in the meantime.

## 2021-08-04 DIAGNOSIS — R3915 Urgency of urination: Secondary | ICD-10-CM | POA: Diagnosis not present

## 2021-08-04 DIAGNOSIS — N281 Cyst of kidney, acquired: Secondary | ICD-10-CM | POA: Diagnosis not present

## 2021-08-05 ENCOUNTER — Other Ambulatory Visit: Payer: Self-pay

## 2021-08-05 ENCOUNTER — Encounter: Payer: Self-pay | Admitting: Podiatry

## 2021-08-05 ENCOUNTER — Ambulatory Visit (INDEPENDENT_AMBULATORY_CARE_PROVIDER_SITE_OTHER): Payer: Medicare Other | Admitting: Student

## 2021-08-05 ENCOUNTER — Encounter: Payer: Self-pay | Admitting: Student

## 2021-08-05 DIAGNOSIS — M629 Disorder of muscle, unspecified: Secondary | ICD-10-CM | POA: Diagnosis not present

## 2021-08-05 DIAGNOSIS — D649 Anemia, unspecified: Secondary | ICD-10-CM | POA: Diagnosis not present

## 2021-08-05 NOTE — Progress Notes (Signed)
° ° °  SUBJECTIVE:   CHIEF COMPLAINT / HPI:   Left sided upper back pain Started a few weeks ago, spontaneously. Has tried ice/heat, tylenol with no relief. 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 No numbness/tingling in neck, arm, forearm, hand or fingers. Denies neck or shoulder pain, it is mostly located in the trapezius region.  In-person ASL interpreter used throughout entirety of visit.  PERTINENT  PMH / PSH: Reviewed  OBJECTIVE:   BP 128/64    Pulse 80    Wt 135 lb (61.2 kg)    BMI 23.17 kg/m   General: Well-appearing, in no distress CV: Regular rate Resp: Breathing comfortably on room air MSK: Normal active and passive range of motion with left arm and shoulder. Normal strength.  Left trapezius soft tissue with palpable tightness, trigger points and tenderness to palpation. Skin: Warm, dry, no rashes or lesions  ASSESSMENT/PLAN:   Musculoskeletal disorder involving upper trapezius muscle Likely strain of left trapezius muscle, probably worsened by occupation with lifting. No cervical or intra-articular shoulder pain. No concern for radiculopathy, nerve impingement, rotator cuff tear or adhesive capsulitis given location and symptoms only of muscle tightness and pain in distinct location. Physical exam reassuring with good ROM. Discussed at-home remedies in step-wise fashion to try ice/heat and stretches later on when tolerable. Advised to limit use of that arm/shoulder. Discussed likelihood that this may take weeks/months to improve. Provided patient with return precautions should she develop numbness/tingling, severe pain.     Orvis Brill, [place]

## 2021-08-05 NOTE — Assessment & Plan Note (Signed)
Likely strain of left trapezius muscle, probably worsened by occupation with lifting. No cervical or intra-articular shoulder pain. No concern for radiculopathy, nerve impingement, rotator cuff tear or adhesive capsulitis given location and symptoms only of muscle tightness and pain in distinct location. Physical exam reassuring with good ROM. Discussed at-home remedies in step-wise fashion to try ice/heat and stretches later on when tolerable. Advised to limit use of that arm/shoulder. Discussed likelihood that this may take weeks/months to improve. Provided patient with return precautions should she develop numbness/tingling, severe pain.

## 2021-08-05 NOTE — Patient Instructions (Signed)
It was great to see you, Alicia [name].   I think your left upper back pain is due to muscle strain. This will get better over time- weeks to months. Continue using ice/heat at home and then add stretches as able. Avoid excessive use of that arm/shoulder.  If you develop worsening severe pain, weakness or numbness in your hand or arm, please schedule an appointment with [name].  Best wishes, Dr. Owens Shark

## 2021-08-06 ENCOUNTER — Other Ambulatory Visit: Payer: Self-pay | Admitting: Podiatry

## 2021-08-06 MED ORDER — EFINACONAZOLE 10 % EX SOLN: 1.0000 [drp] | Freq: Every day | CUTANEOUS | 11 refills | Status: DC

## 2021-08-15 ENCOUNTER — Other Ambulatory Visit: Payer: Self-pay

## 2021-08-15 DIAGNOSIS — Z794 Long term (current) use of insulin: Secondary | ICD-10-CM

## 2021-08-15 DIAGNOSIS — E118 Type 2 diabetes mellitus with unspecified complications: Secondary | ICD-10-CM

## 2021-08-15 MED ORDER — ACCU-CHEK AVIVA PLUS VI STRP: ORAL_STRIP | 1 refills | Status: DC

## 2021-08-15 NOTE — Telephone Encounter (Signed)
Patient calls nurse line regarding issues with picking up accu-chek glucose testing strips. Current [facility] [address] is [facility] and will need [facility] [address]. [facility].   Patient is also requesting refill on baclofen for continued muscle pain in neck and back.   Medication pended to encounter. Please advise.   Talbot Grumbling, RN

## 2021-08-16 ENCOUNTER — Other Ambulatory Visit: Payer: Self-pay | Admitting: Family Medicine

## 2021-08-16 MED ORDER — BACLOFEN 10 MG PO TABS: 10.0000 mg | ORAL_TABLET | Freq: Three times a day (TID) | ORAL | 0 refills | Status: DC | PRN

## 2021-08-16 NOTE — Telephone Encounter (Signed)
Patient LVM on nurse line requesting returned phone call due to continued sharp pains in her neck. Patient reports that she was having a difficult time turning her head when driving due to pain.   I attempted to call patient back. No answer. LVM for patient to return call to office.   Talbot Grumbling, RN

## 2021-08-16 NOTE — Telephone Encounter (Signed)
Patient returns call to nurse line regarding neck and right sided shoulder pain. Patient states that she woke up today and that pain had significantly worsened. Patient is currently rating pain at 10/10.   Advised patient that due to significant worsening that she should be evaluated as soon as possible. Due to [name] not having any appointments today, recommended that patient be evaluated in [facility]. Patient states that she is unsure if she has transportation to receive evaluation today. Patient is requesting appointment for tomorrow. Scheduled patient for tomorrow morning. Patient is requesting in person ASL interpreter. Called interpreter services and LVM to ensure patient would have interpreter for visit.   UC/ED precautions given.   Patient is requesting any additional recommendations to alleviate pain. States that we can leave a detailed message on her VM if she is unable to answer.   Talbot Grumbling, RN

## 2021-08-17 ENCOUNTER — Ambulatory Visit (INDEPENDENT_AMBULATORY_CARE_PROVIDER_SITE_OTHER): Payer: Medicare Other | Admitting: Family Medicine

## 2021-08-17 ENCOUNTER — Other Ambulatory Visit: Payer: Self-pay

## 2021-08-17 VITALS — BP 119/70 | HR 94 | Ht 64.0 in | Wt 133.4 lb

## 2021-08-17 DIAGNOSIS — M629 Disorder of muscle, unspecified: Secondary | ICD-10-CM

## 2021-08-17 MED ORDER — TIZANIDINE HCL 4 MG PO TABS: 4.0000 mg | ORAL_TABLET | Freq: Four times a day (QID) | ORAL | 0 refills | Status: DC | PRN

## 2021-08-17 MED ORDER — NAPROXEN 500 MG PO TABS: 500.0000 mg | ORAL_TABLET | Freq: Two times a day (BID) | ORAL | 0 refills | Status: DC

## 2021-08-17 MED ORDER — ACETAMINOPHEN ER 650 MG PO TBCR: 650.0000 mg | EXTENDED_RELEASE_TABLET | Freq: Three times a day (TID) | ORAL | 0 refills | Status: AC | PRN

## 2021-08-17 NOTE — Progress Notes (Signed)
° ° °  SUBJECTIVE:   CHIEF COMPLAINT / HPI: neck pain  Due to language barrier, an interpreter was present during the history-taking and subsequent discussion (and for part of the physical exam) with this patient.   Reports neck pain for 3-4 weeks after waking up with pain in neck. Seen in clinic for muscle pain but reports that pain has increased.  Was worse yesterday while at work.  Worse on right side.  Pain extends outward on both sides and mid back. Denies any injury, headaches, visual disturbances ,fevers, upper extremity numbness or tingling.  Difficult to sign or turn head because of pain.  Tried ice and exercise which has not helped.    PERTINENT  PMH / PSH:  Multi level spondylosis and facet hypertrophy of L-spine OA of multiple joints Bilateral hearing loss Bilateral Sciatica OBJECTIVE:   BP 119/70    Pulse 94    Ht 5\' 4"  (1.626 m)    Wt 133 lb 6.4 oz (60.5 kg)    SpO2 99%    BMI 22.90 kg/m    General: Alert, no acute distress No gross deformity, swelling, bruising. TTP along trapezius and supraspinatus  muscles bilaterally. No midline/bony TTP. ROM limited secondary to pain when asked to perform tasks.  FROM when distracted. BUE strength 5/5.   Sensation intact to light touch.   2+ equal reflexes in triceps, biceps, brachioradialis tendons. Negative spurlings. NV intact distal BUEs.   ASSESSMENT/PLAN:   Musculoskeletal disorder involving upper trapezius muscle Continues to have neck pain especially at work.  No red flags. Noted spondylosis on previous L Spine xrays.  Considered OA, Acute Myofascial pain, Spinal Nerve etiology. Given tenderness in 6 trigger points could consider Fibromyalgia in differential.  -Will trial Zanaflex 4 mg q8h prn x 14 days -Naproxen 500 mg BID x 14 days -Tylenol 650 mg q6h x 14 day -Continue Voltaren gel QID -Heat/Ice as needed -Continue neck exercises/stretching.  Offered PT, patient declined.  -Discussed that may take 6-8 weeks to return  to baseline. -Follow up with PCP in 2 weeks.  Recommend assessment of trigger points for FM if continues to have pain.  Would also recommend C-Spine xray if no improvement.     Carollee Leitz, MD [place]

## 2021-08-17 NOTE — Patient Instructions (Addendum)
Thank you for coming to see me today. It was a pleasure.   Take Zanaflex at night Take Naproxen twice a day Take Tylenol three times a day  Use heat and ice as needed Continue gentle neck exercises  Please follow-up with PCP in 2 weeks  If you have any questions or concerns, please do not hesitate to call the office at [phone].  Best,    Carollee Leitz, MD    Muscle Strain A muscle strain, or pulled muscle, happens when a muscle is stretched beyond its normal length. This can tear some muscle fibers and cause pain. Usually, it takes 1-2 weeks to heal from a muscle strain. Full healing normally takes 5-6 weeks. What are the causes? This condition is caused when a sudden force is placed on a muscle and stretches it too far. This can happen with a fall, while lifting, or during sports. What increases the risk? You are more likely to develop a muscle strain if you are an athlete or you do a lot of physical activity. What are the signs or symptoms? Pain. Tenderness. Bruising. Swelling. Trouble using the muscle. How is this treated? This condition is first treated with PRICE therapy. This involves: Protecting your muscle from being injured again. Resting your injured muscle. Icing your injured muscle. Putting pressure (compression) on your injured muscle. This may be done with a splint or elastic bandage. Raising (elevating) your injured muscle. Your doctor may also recommend medicine for pain. Follow these instructions at home: If you have a splint that can be taken off: Wear the splint as told by your doctor. Take it off only as told by your doctor. Check the skin around the splint every day. Tell your doctor if you see problems. Loosen the splint if your fingers or toes: Tingle. Become numb. Turn cold and blue. Keep the splint clean. If the splint is not waterproof: Do not let it get wet. Cover it with a watertight covering when you take a bath or a  shower. Managing pain, stiffness, and swelling  If told, put ice on your injured area. To do this: If you have a removable splint, take it off as told by your doctor. Put ice in a plastic bag. Place a towel between your skin and the bag. Leave the ice on for 20 minutes, 2-3 times a day. Take off the ice if your skin turns bright red. This is very important. If you cannot feel pain, heat, or cold, you have a greater risk of damage to the area. Move your fingers or toes often. Raise the injured area above the level of your heart while you are sitting or lying down. Wear an elastic bandage as told by your doctor. Make sure it is not too tight. General instructions Take over-the-counter and prescription medicines only as told by your doctor. This may include: Medicines for pain and swelling that are taken by mouth or put on the skin. Medicines to help relax your muscles. Limit your activity. Rest your injured muscle as told by your doctor. Your doctor may say that gentle movements are okay. If physical therapy was prescribed, do exercises as told by your doctor. Do not put pressure on any part of the splint until it is fully hardened. This may take many hours. Do not smoke or use any products that contain nicotine or tobacco. If you need help quitting, ask your doctor. Ask your doctor when it is safe to drive if you have a splint. Keep  all follow-up visits. How is this prevented? Warm up before you exercise. This helps to prevent more muscle strains. Contact a doctor if: You have more pain or swelling in the injured area. Get help right away if: You have any of these problems in your injured area: Numbness. Tingling. Less strength than normal. Summary A muscle strain is an injury that happens when a muscle is stretched beyond normal length. This condition is first treated with PRICE therapy. This includes protecting, resting, icing, adding pressure, and raising your injury. Limit your  activity. Rest your injured muscle as told by your doctor. Your doctor may say that gentle movements are okay. Warm up before you exercise. This helps to prevent more muscle strains. This information is not intended to replace advice given to you by your health care provider. Make sure you discuss any questions you have with your health care provider. Document Revised: 09/06/2020 Document Reviewed: 09/06/2020 Elsevier Patient Education  [place].

## 2021-08-18 ENCOUNTER — Encounter: Payer: Self-pay | Admitting: Family Medicine

## 2021-08-18 NOTE — Assessment & Plan Note (Addendum)
Continues to have neck pain especially at work.  No red flags. Noted spondylosis on previous L Spine xrays.  Considered OA, Acute Myofascial pain, Spinal Nerve etiology. Given tenderness in 6 trigger points could consider Fibromyalgia in differential.  -Will trial Zanaflex 4 mg q8h prn x 14 days -Naproxen 500 mg BID x 14 days -Tylenol 650 mg q6h x 14 day -Continue Voltaren gel QID -Heat/Ice as needed -Continue neck exercises/stretching.  Offered PT, patient declined.  -Discussed that may take 6-8 weeks to return to baseline. -Follow up with PCP in 2 weeks.  Recommend assessment of trigger points for FM if continues to have pain.  Would also recommend C-Spine xray if no improvement.

## 2021-08-21 ENCOUNTER — Other Ambulatory Visit: Payer: Self-pay | Admitting: Family Medicine

## 2021-08-25 DIAGNOSIS — H35373 Puckering of macula, bilateral: Secondary | ICD-10-CM | POA: Diagnosis not present

## 2021-08-25 DIAGNOSIS — H35033 Hypertensive retinopathy, bilateral: Secondary | ICD-10-CM | POA: Diagnosis not present

## 2021-08-25 DIAGNOSIS — E103513 Type 1 diabetes mellitus with proliferative diabetic retinopathy with macular edema, bilateral: Secondary | ICD-10-CM | POA: Diagnosis not present

## 2021-08-25 DIAGNOSIS — E103512 Type 1 diabetes mellitus with proliferative diabetic retinopathy with macular edema, left eye: Secondary | ICD-10-CM | POA: Diagnosis not present

## 2021-08-25 DIAGNOSIS — H4312 Vitreous hemorrhage, left eye: Secondary | ICD-10-CM | POA: Diagnosis not present

## 2021-08-26 ENCOUNTER — Ambulatory Visit
Admission: RE | Admit: 2021-08-26 | Discharge: 2021-08-26 | Disposition: A | Discharge status: Home / Self Care | Payer: Medicare Other | Source: Ambulatory Visit | Attending: Family Medicine | Admitting: Family Medicine

## 2021-08-26 DIAGNOSIS — M8589 Other specified disorders of bone density and structure, multiple sites: Secondary | ICD-10-CM | POA: Diagnosis not present

## 2021-08-26 DIAGNOSIS — E2839 Other primary ovarian failure: Secondary | ICD-10-CM

## 2021-08-26 DIAGNOSIS — Z780 Asymptomatic menopausal state: Secondary | ICD-10-CM | POA: Diagnosis not present

## 2021-09-20 DIAGNOSIS — D509 Iron deficiency anemia, unspecified: Secondary | ICD-10-CM | POA: Diagnosis not present

## 2021-09-25 NOTE — Progress Notes (Signed)
? ? ?  SUBJECTIVE:  ? ?CHIEF COMPLAINT / HPI: f/u for neck pain  ? ?ALS interpreter was present for this encounter.  ? ?Patient presents for follow up of neck pain  ?She was initially seen in February 2023 and prescribed Naproxen, zanaflex, tylenol  ?Today she reports continued pain in her upper back  ?She denies injury  ?She denies presence of weakness in her upper extremities  ?She has difficulty with motions that require her to look down  ? ?PERTINENT  PMH / PSH:  ?Hyperthyrodism  ?DM  ?OA in multiple joints  ?Spinal stenosis L4-L5 ? ?OBJECTIVE:  ? ?BP 115/75   Pulse 95   Ht '5\' 4"'$  (1.626 m)   Wt 129 lb (58.5 kg)   SpO2 98%   BMI 22.14 kg/m?   ?No gross deformity, swelling, bruising. ?TTP along sternocleidomastoid on left .  No midline/bony TTP. ?FROM. ?BUE strength 5/5.   ?Sensation intact to light touch.   ?+ spurlings. ?NV intact distal BUEs. ? ? ?ASSESSMENT/PLAN:  ? ?Sternocleidomastoid muscle tenderness ?Unresponsive to muscle relaxer and Tylenol, heat or ice  ?Will obtain cervical spine xray ?Trial baclofen, however patient declines as she already has this prescription ? DC zanaflex  ?Continue Tylenol 650 mg q6h x 14 day ?Continue Voltaren gel QID ?Heat/Ice as needed ?Continue neck exercises/stretching.   ?Will Offer PT again pending results of XR  ?  ? ? ?Eulis Foster, MD ?[place]  ?

## 2021-09-26 ENCOUNTER — Ambulatory Visit
Admission: RE | Admit: 2021-09-26 | Discharge: 2021-09-26 | Disposition: A | Discharge status: Home / Self Care | Payer: Medicare Other | Source: Ambulatory Visit | Attending: Family Medicine | Admitting: Family Medicine

## 2021-09-26 ENCOUNTER — Ambulatory Visit (INDEPENDENT_AMBULATORY_CARE_PROVIDER_SITE_OTHER): Payer: Medicare Other | Admitting: Family Medicine

## 2021-09-26 ENCOUNTER — Encounter: Payer: Self-pay | Admitting: Family Medicine

## 2021-09-26 ENCOUNTER — Other Ambulatory Visit: Payer: Self-pay

## 2021-09-26 VITALS — BP 115/75 | HR 95 | Ht 64.0 in | Wt 129.0 lb

## 2021-09-26 DIAGNOSIS — M7912 Myalgia of auxiliary muscles, head and neck: Secondary | ICD-10-CM

## 2021-09-26 DIAGNOSIS — M542 Cervicalgia: Secondary | ICD-10-CM | POA: Diagnosis not present

## 2021-09-26 MED ORDER — BACLOFEN 10 MG PO TABS: 10.0000 mg | ORAL_TABLET | Freq: Three times a day (TID) | ORAL | 0 refills | Status: DC

## 2021-09-26 NOTE — Assessment & Plan Note (Addendum)
Unresponsive to muscle relaxer and Tylenol, heat or ice  ?Will obtain cervical spine xray ?Trial baclofen, however patient declines as she already has this prescription ? DC zanaflex  ?Continue Tylenol 650 mg q6h x 14 day ?Continue Voltaren gel QID ?Heat/Ice as needed ?Continue neck exercises/stretching.   ?Will Offer PT again pending results of XR  ? ?

## 2021-09-26 NOTE — Patient Instructions (Addendum)
Please report to [facility] located at: ?[address]  ?[address]  ?[place], [place] [address] ? ?You do not need an appointment to have xrays completed.  ? ?I will follow up with abnormal results once available. ? ?Please continue to apply voltaren gel as needed, along with heat and ice throughout the day  ? ?I have also submitted a prescription for Baclofen. You can stop the Zanaflex from your previous visit.  ? ? ? ? ?

## 2021-10-08 ENCOUNTER — Other Ambulatory Visit: Payer: Self-pay | Admitting: Family Medicine

## 2021-10-13 NOTE — Progress Notes (Signed)
? ?I, [name], LAT, ATC, am serving as scribe for Dr. Lynne Leader. ? ?Alicia [name] is a 66 y.o. female who presents to [place] at [facility] today for neck pain and UE paresthesias.  She was last seen by Dr. Georgina Snell on 03/01/21 for B hip pain.  Today, pt reports neck pain since mid-March 2023.  She locates her pain to bilat sides on the neck, L>R.  She has been seen x 3 visits at [facility] w/ similar c/o and has been prescribed multiple medications including naproxen, tizanidine and Tylenol.  Pt notes increased pain at night. Pt works at a daycare.  She notes some weakness and is occasionally dropping objects.  Symptoms are worsening. ? ?Radiating pain: yes- bilat hands ?UE paresthesias: yes- L>R ?UE weakness: yes ?Aggravating factors: cervical AROM, esp rotation ?Treatments tried: naproxen, tizanidine and Tylenol; Voltaren gel; neck exercises/HEP; heat ? ?Diagnostic testing: C-spine XR- 09/26/21 ? ?Pertinent review of systems: No fevers or chills ? ?Relevant historical information: Diabetes.  Hard of hearing/deaf using American sign language interpreter. ? ? ?Exam:  ?BP (!) 142/80   Pulse 90   Ht '5\' 4"'$  (1.626 m)   Wt 124 lb (56.2 kg)   SpO2 97%   BMI 21.28 kg/m?  ?General: Well Developed, well nourished, and in no acute distress.  ? ?MSK: C-spine: Normal-appearing ?Nontender to midline. ?Tender palpation left paraspinal musculature. ?Decreased cervical motion. ?Positive left-sided Spurling's test. ?Upper extremity strength slight decreased strength to left elbow flexion and left wrist and finger extension ?Grip strength is intact. ?Reflexes and sensation are intact distal bilateral upper extremities. ? ? ? ?Lab and Radiology Results ? ?EXAM: ?CERVICAL SPINE - COMPLETE 4+ VIEW ?  ?COMPARISON:  None. ?  ?FINDINGS: ?There is no evidence of cervical spine fracture or prevertebral soft ?tissue swelling. Alignment is normal. Severe degenerative disc ?disease is  noted at C5-6. There appears to be fusion of the C6 and ?C7 vertebra most likely due to degenerative change or congenital. ?Bilateral neural foraminal stenosis is noted at C5-6 and C6-7 ?secondary to uncovertebral spurring. ?  ?IMPRESSION: ?Severe degenerative disc disease is noted at C5-6. Bilateral neural ?foraminal stenosis secondary to uncovertebral spurring is noted as ?described above. No acute abnormality seen. ?  ?  ?Electronically Signed ?  By: Marijo Conception M.D. ?  On: 09/27/2021 08:35 ?  ?I, Lynne Leader, personally (independently) visualized and performed the interpretation of the images attached in this note. ? ? ? ? ? ?Assessment and Plan: ?66 y.o. female with neck pain and cervical radiculopathy left worse than right.  Cervical radiculopathy is most consistent with C6 dermatomal pattern.  She may have some C7 component as well. ?She has significant DDD at C5-6 on x-ray obtained March 27. ?She is developing weakness and overall is worsening.  At this point given her progressive neurological symptoms we will proceed to MRI to further evaluate source of weakness and pain and for epidural steroid injection planning.  Discussed with patient that likely will proceed directly to epidural steroid injection after MRI. ? ?American sign language interpreter used for today's visit. ? ? ?PDMP not reviewed this encounter. ?Orders Placed This Encounter  ?Procedures  ? MR Cervical Spine Wo Contrast  ?  Standing Status:   Future  ?  Standing Expiration Date:   10/15/2022  ?  Order Specific Question:   What is the patient's sedation requirement?  ?  Answer:   No Sedation  ?  Order Specific Question:   Does the patient have a pacemaker or implanted devices?  ?  Answer:   No  ?  Order Specific Question:   Preferred imaging location?  ?  Answer:   Product/process development scientist (table limit-350lbs)  ? ?No orders of the defined types were placed in this encounter. ? ? ? ?Discussed warning signs or symptoms. Please see discharge  instructions. Patient expresses understanding. ? ? ?The above documentation has been reviewed and is accurate and complete Lynne Leader, M.D. ? ? ?

## 2021-10-14 ENCOUNTER — Ambulatory Visit (INDEPENDENT_AMBULATORY_CARE_PROVIDER_SITE_OTHER): Payer: Medicare Other | Admitting: Family Medicine

## 2021-10-14 ENCOUNTER — Other Ambulatory Visit: Payer: Self-pay | Admitting: *Deleted

## 2021-10-14 ENCOUNTER — Other Ambulatory Visit: Payer: Self-pay | Admitting: Family Medicine

## 2021-10-14 VITALS — BP 142/80 | HR 90 | Ht 64.0 in | Wt 124.0 lb

## 2021-10-14 DIAGNOSIS — E118 Type 2 diabetes mellitus with unspecified complications: Secondary | ICD-10-CM

## 2021-10-14 DIAGNOSIS — M5010 Cervical disc disorder with radiculopathy, unspecified cervical region: Secondary | ICD-10-CM | POA: Diagnosis not present

## 2021-10-14 DIAGNOSIS — R29898 Other symptoms and signs involving the musculoskeletal system: Secondary | ICD-10-CM | POA: Diagnosis not present

## 2021-10-14 MED ORDER — ACCU-CHEK AVIVA PLUS VI STRP: ORAL_STRIP | 2 refills | Status: DC

## 2021-10-14 NOTE — Patient Instructions (Addendum)
Thank you for coming in today.  ? ?You should hear from MRI scheduling within 1 week. If you do not hear please let me know.   ? ?Follow-up after MRI. ?

## 2021-10-16 ENCOUNTER — Ambulatory Visit (INDEPENDENT_AMBULATORY_CARE_PROVIDER_SITE_OTHER): Payer: Medicare Other

## 2021-10-16 ENCOUNTER — Other Ambulatory Visit: Payer: Medicare Other

## 2021-10-16 DIAGNOSIS — M4312 Spondylolisthesis, cervical region: Secondary | ICD-10-CM | POA: Diagnosis not present

## 2021-10-16 DIAGNOSIS — M2578 Osteophyte, vertebrae: Secondary | ICD-10-CM | POA: Diagnosis not present

## 2021-10-16 DIAGNOSIS — M5412 Radiculopathy, cervical region: Secondary | ICD-10-CM | POA: Diagnosis not present

## 2021-10-16 DIAGNOSIS — M5010 Cervical disc disorder with radiculopathy, unspecified cervical region: Secondary | ICD-10-CM

## 2021-10-17 ENCOUNTER — Telehealth: Payer: Self-pay | Admitting: Family Medicine

## 2021-10-17 DIAGNOSIS — M5010 Cervical disc disorder with radiculopathy, unspecified cervical region: Secondary | ICD-10-CM

## 2021-10-17 NOTE — Telephone Encounter (Signed)
Epidural steroid injection ordered 

## 2021-10-17 NOTE — Progress Notes (Signed)
MRI has multiple areas of potential pinched nerves.  Your arm pain could be explained by this MRI.  I have ordered an epidural steroid injection.  Please contact [facility] to schedule the epidural steroid injection.  Phone number is [phone] or [phone] to schedule. ? ?I recommend you see me in about a month.  You should have had your injection and hopefully should be feeling better.

## 2021-10-21 ENCOUNTER — Ambulatory Visit (INDEPENDENT_AMBULATORY_CARE_PROVIDER_SITE_OTHER): Payer: Medicare Other | Admitting: Podiatry

## 2021-10-21 ENCOUNTER — Encounter: Payer: Self-pay | Admitting: Podiatry

## 2021-10-21 DIAGNOSIS — M79674 Pain in right toe(s): Secondary | ICD-10-CM | POA: Diagnosis not present

## 2021-10-21 DIAGNOSIS — E1149 Type 2 diabetes mellitus with other diabetic neurological complication: Secondary | ICD-10-CM | POA: Diagnosis not present

## 2021-10-21 DIAGNOSIS — B351 Tinea unguium: Secondary | ICD-10-CM | POA: Diagnosis not present

## 2021-10-21 DIAGNOSIS — R2681 Unsteadiness on feet: Secondary | ICD-10-CM

## 2021-10-21 DIAGNOSIS — M79675 Pain in left toe(s): Secondary | ICD-10-CM

## 2021-10-21 MED ORDER — CICLOPIROX 8 % EX SOLN: Freq: Every day | CUTANEOUS | 2 refills | Status: DC

## 2021-10-23 ENCOUNTER — Other Ambulatory Visit: Payer: Self-pay | Admitting: Family Medicine

## 2021-10-24 NOTE — Progress Notes (Signed)
Subjective: ?66 year old female presents the office today for follow-up evaluation of nail fungus no other nails trimmed today as they are thickened elongated has difficulty trimming them.  She was unable to get the medication previously given cost she is asking for an alternative.  No swelling or redness or any drainage.  States that she has been having some balance issues as well.  No recent injuries.  No open lesions.   ? ?Last A1c was 7.1 on June 20, 2021 ? ?Objective: ?AAO x3, NAD-presents with interpreter ?DP/PT pulses palpable bilaterally, CRT less than 3 seconds ?Nails appear to be hypertrophic, dystrophic with yellow-brown discoloration.  No edema, erythema to the toenail sites.  Tenderness nails 1-5 bilaterally.  No open lesions bilaterally.   ?No pain with calf compression, swelling, warmth, erythema ? ?Assessment: ?Symptomatic onychomycosis; gait instability ? ?Plan: ?-All treatment options discussed with the patient including all alternatives, risks, complications.  ?-Nails sharply debrided x10 without any complications or bleeding.  Prescribed [place]. ?-Referral to physical therapy for gait instability.  She is not able to go at this time but I will still place referral for her to go to when she is able to. ?-Patient encouraged to call the office with any questions, concerns, change in symptoms.  ? ?Trula Slade DPM ? ?

## 2021-10-25 ENCOUNTER — Other Ambulatory Visit: Payer: Self-pay | Admitting: Physician Assistant

## 2021-10-25 ENCOUNTER — Other Ambulatory Visit: Payer: Self-pay | Admitting: Family Medicine

## 2021-10-25 DIAGNOSIS — E119 Type 2 diabetes mellitus without complications: Secondary | ICD-10-CM

## 2021-10-27 DIAGNOSIS — K319 Disease of stomach and duodenum, unspecified: Secondary | ICD-10-CM | POA: Diagnosis not present

## 2021-10-27 DIAGNOSIS — K648 Other hemorrhoids: Secondary | ICD-10-CM | POA: Diagnosis not present

## 2021-10-27 DIAGNOSIS — K2970 Gastritis, unspecified, without bleeding: Secondary | ICD-10-CM | POA: Diagnosis not present

## 2021-10-27 DIAGNOSIS — D509 Iron deficiency anemia, unspecified: Secondary | ICD-10-CM | POA: Diagnosis not present

## 2021-10-27 DIAGNOSIS — K3189 Other diseases of stomach and duodenum: Secondary | ICD-10-CM | POA: Diagnosis not present

## 2021-10-27 NOTE — Progress Notes (Signed)
? ?I, Alicia [name], LAT, ATC, am serving as scribe for Dr. Lynne Leader. ? ?Alicia [name] is a 66 y.o. female who presents to [place] at [facility] today for R knee pain.  She was last seen by Dr. Georgina Snell on 10/14/21 for neck pain and UE paresthesias.  Today, pt reports R knee/calf pain after suffering a fall on 10/23/21.  She fell and landed on her L side.  She then fell again the next day and landed on her L hip and elbow.  She locates her pain to her R post lower leg/calf and L lateral hip. ? ?At the last visit with me she was diagnosed with suspected cervical radiculopathy and MRI was ordered.  MRI did show significant nerve impingement and she has an epidural steroid injection scheduled for May 2. ? ?She saw her podiatrist on April 21 who referred her to PT for balance and gait.  She has not yet started physical therapy for this yet. ? ?R knee/calf swelling: no  ?Aggravating factors: crossing her legs; twisting movements; transitioning from sit-to-stand ?Treatments tried: brace; cane; Baclofen ? ? ?Pertinent review of systems: No fevers or chills ? ?Relevant historical information: Diabetes.  Deaf. ? ? ?Exam:  ?BP 120/64 (BP Location: Left Arm, Patient Position: Sitting, Cuff Size: Normal)   Pulse 74   Ht '5\' 4"'$  (1.626 m)   Wt 131 lb 3.2 oz (59.5 kg)   SpO2 94%   BMI 22.52 kg/m?  ?General: Well Developed, well nourished, and in no acute distress.  ? ?MSK:  ?Left hip: Normal-appearing ?Tender palpation greater trochanter. ?Normal hip motion. ?Hip abduction strength diminished 4/5 with pain.  External rotation strength mildly diminished 4+/5. ? ?Right knee normal-appearing normal motion with crepitation. ?Nontender. ?Intact strength some pain with resisted knee extension felt at the posterior knee. ? ? ? ?Lab and Radiology Results ? ?Procedure: Real-time Ultrasound Guided Injection of the left lateral hip greater trochanter bursa ?Device: Philips Affiniti 50G ?Images permanently stored and  available for review in PACS ?Verbal informed consent obtained.  Discussed risks and benefits of procedure. Warned about infection, bleeding, hyperglycemia damage to structures among others. ?Patient expresses understanding and agreement ?Time-out conducted.   ?Noted no overlying erythema, induration, or other signs of local infection.   ?Skin prepped in a sterile fashion.   ?Local anesthesia: Topical Ethyl chloride.   ?With sterile technique and under real time ultrasound guidance: 40 mg of Kenalog and 2 mL of Marcaine injected into greater trochanter bursa. Fluid seen entering the bursa.   ?Completed without difficulty   ?Pain immediately resolved suggesting accurate placement of the medication.   ?Advised to call if fevers/chills, erythema, induration, drainage, or persistent bleeding.   ?Images permanently stored and available for review in the ultrasound unit.  ?Impression: Technically successful ultrasound guided injection. ? ? ? ?X-ray images left hip and right knee obtained today personally and independently interpreted ? ?Left hip: No acute fractures are present.  No significant left hip degenerative changes are present. ?Rounded calcified disc disease present in the pelvis likely represent uterine fibroids. ? ?Right knee: Superior patellar pole osteophyte.  Minimal degenerative changes.  No acute fractures are present. ? ?Await formal radiology review ? ? ?EXAM: ?MRI CERVICAL SPINE WITHOUT CONTRAST ?  ?TECHNIQUE: ?Multiplanar, multisequence MR imaging of the cervical spine was ?performed. No intravenous contrast was administered. ?  ?COMPARISON:  Cervical spine radiographs 09/26/2021. ?  ?FINDINGS: ?Alignment: Combined straightening and lordotic curvature of the ?cervical spine with subtle  anterolisthesis of C4 on C5, stable from ?last month. ?  ?Vertebrae: Normal background bone marrow signal. Congenital ?incomplete segmentation of C6 and C7. Patchy degenerative appearing ?marrow edema involving both the  C4-C5 endplates, and left side ?facets (series 5, image 11). See details of that level below. Lesser ?patchy degenerative endplate marrow edema at C5-C6. ?  ?Cord: Degenerative spinal cord mass effect detailed below. No ?definite spinal cord signal abnormality. ?  ?Posterior Fossa, vertebral arteries, paraspinal tissues: ?Cervicomedullary junction is within normal limits. Negative visible ?posterior fossa and brain parenchyma. Preserved major vascular flow ?voids in the neck. Negative visible neck soft tissues and lung ?apices. ?  ?Disc levels: ?  ?C2-C3: Minor disc space loss and foraminal endplate spurring. No ?significant stenosis. ?  ?C3-C4: Mild disc space loss, disc bulging and endplate spurring. ?Mild facet hypertrophy. Mild spinal stenosis. Mild if any cord mass ?effect. Mild to moderate left and moderate to severe right C4 ?foraminal stenosis. ?  ?C4-C5: Subtle anterolisthesis. Disc space loss with circumferential ?disc bulge and endplate spurring eccentric to the left. Broad-based ?posterior component. Moderate facet and mild ligament flavum ?hypertrophy greater on the left. Spinal stenosis with mild cord mass ?effect (series 8, image 15). Severe left and moderate to severe ?right C5 foraminal stenosis. ?  ?C5-C6: Severe disc space loss. Circumferential disc osteophyte ?complex eccentric to the right. Broad-based posterior component. ?Mild facet hypertrophy. Mild spinal stenosis and spinal cord mass ?effect (series 8, image 18). Severe bilateral C6 foraminal stenosis. ?  ?C6-C7:  Incomplete segmentation.  Otherwise negative. ?  ?C7-T1: Mild facet, ligament flavum hypertrophy and endplate ?spurring. No stenosis. ?  ?Up to mild visible upper thoracic spinal stenosis at T2-T3 related ?to the mild central disc bulging and moderate posterior element ?hypertrophy. ?  ?IMPRESSION: ?1. 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 ?Severely degenerated C5-C6 and C4-C5 levels. Mild anterolisthesis  at ?the latter with some associated acute exacerbation of chronic left ?side facet arthropathy at C4-C5. ?  ?2. Mild multifactorial spinal stenosis at both levels with spinal ?cord mass effect but no spinal cord signal abnormality. Moderate to ?severe left greater than right C5 and bilateral C6 neural foraminal ?stenosis. ?  ?3. Mild spinal stenosis also at C3-C4 with moderate to severe right ?C4 foraminal stenosis. ?  ?4. Possible mild degenerative upper thoracic spinal stenosis at ?T2-T3. ?  ?  ?Electronically Signed ?  By: Genevie Ann M.D. ?  On: 10/17/2021 09:51 ?  ?I, Lynne Leader, personally (independently) visualized and performed the interpretation of the images attached in this note. ? ? ?Assessment and Plan: ?66 y.o. female with left cervical radiculopathy.  MRI shows severe stenosis changes on MRI.  Plan for epidural steroid injection already scheduled.  Recheck back in 1 month. ? ?Left lateral hip pain after fall.  Trochanteric bursitis.  Injection today.  Again recheck in 1 month. ? ?Right knee pain: Unclear etiology.  X-ray obtained today.  Physical therapy already ordered should be helpful.  Recheck in 1 month. ? ?American sign language interpreter used for today's visit. ? ? ?PDMP not reviewed this encounter. ?Orders Placed This Encounter  ?Procedures  ? [name] LIMITED JOINT SPACE STRUCTURES LOW RIGHT(NO LINKED CHARGES)  ?  Order Specific Question:   Reason for Exam (SYMPTOM  OR DIAGNOSIS REQUIRED)  ?  Answer:   R knee pain  ?  Order Specific Question:   Preferred imaging location?  ?  Answer:   [facility]  ? DG HIP [facility]  OR W/O PELVIS 2-3 VIEWS LEFT  ?  Standing Status:   Future  ?  Number of Occurrences:   1  ?  Standing Expiration Date:   11/27/2021  ?  Order Specific Question:   Reason for Exam (SYMPTOM  OR DIAGNOSIS REQUIRED)  ?  Answer:   L hip pain  ?  Order Specific Question:   Preferred imaging location?  ?  Answer:   [facility]  ? DG Knee AP/LAT W/Sun[place] Right   ?  Standing Status:   Future  ?  Number of Occurrences:   1  ?  Standing Expiration Date:   11/27/2021  ?  Order Specific Question:   Reason for Exam (SYMPTOM  OR DIAGNOSIS REQUIRED)  ?  Answer:   r knee pain

## 2021-10-28 ENCOUNTER — Encounter: Payer: Self-pay | Admitting: Family Medicine

## 2021-10-28 ENCOUNTER — Ambulatory Visit (INDEPENDENT_AMBULATORY_CARE_PROVIDER_SITE_OTHER): Payer: Medicare Other

## 2021-10-28 ENCOUNTER — Ambulatory Visit: Payer: Self-pay

## 2021-10-28 ENCOUNTER — Ambulatory Visit (INDEPENDENT_AMBULATORY_CARE_PROVIDER_SITE_OTHER): Payer: Medicare Other | Admitting: Family Medicine

## 2021-10-28 VITALS — BP 120/64 | HR 74 | Ht 64.0 in | Wt 131.2 lb

## 2021-10-28 DIAGNOSIS — M79661 Pain in right lower leg: Secondary | ICD-10-CM | POA: Diagnosis not present

## 2021-10-28 DIAGNOSIS — M5010 Cervical disc disorder with radiculopathy, unspecified cervical region: Secondary | ICD-10-CM | POA: Diagnosis not present

## 2021-10-28 DIAGNOSIS — M25552 Pain in left hip: Secondary | ICD-10-CM

## 2021-10-28 DIAGNOSIS — M25561 Pain in right knee: Secondary | ICD-10-CM

## 2021-10-28 DIAGNOSIS — R296 Repeated falls: Secondary | ICD-10-CM | POA: Diagnosis not present

## 2021-10-28 NOTE — Patient Instructions (Addendum)
Good to see you today. ? ?Please get an Xray today before you leave  ? ?Follow-up: one month (30 min) ?

## 2021-10-31 NOTE — Progress Notes (Signed)
Left hip x-ray shows mild arthritis

## 2021-10-31 NOTE — Progress Notes (Signed)
Right knee x-ray shows some mild arthritis

## 2021-11-01 ENCOUNTER — Ambulatory Visit
Admission: RE | Admit: 2021-11-01 | Discharge: 2021-11-01 | Disposition: A | Discharge status: Home / Self Care | Payer: Medicare Other | Source: Ambulatory Visit | Attending: Family Medicine | Admitting: Family Medicine

## 2021-11-01 DIAGNOSIS — M5010 Cervical disc disorder with radiculopathy, unspecified cervical region: Secondary | ICD-10-CM | POA: Diagnosis not present

## 2021-11-01 MED ORDER — TRIAMCINOLONE ACETONIDE 40 MG/ML IJ SUSP (RADIOLOGY)
60.0000 mg | Freq: Once | INTRAMUSCULAR | Status: AC
  Administered 2021-11-01: 60 mg via EPIDURAL

## 2021-11-01 MED ORDER — IOPAMIDOL (ISOVUE-M 300) INJECTION 61%
1.0000 mL | Freq: Once | INTRAMUSCULAR | Status: AC
  Administered 2021-11-01: 1 mL via EPIDURAL

## 2021-11-01 NOTE — Discharge Instructions (Signed)

## 2021-11-03 DIAGNOSIS — K319 Disease of stomach and duodenum, unspecified: Secondary | ICD-10-CM | POA: Diagnosis not present

## 2021-11-06 ENCOUNTER — Other Ambulatory Visit: Payer: Self-pay | Admitting: Family Medicine

## 2021-11-08 NOTE — Progress Notes (Signed)
? ? ?  SUBJECTIVE:  ? ?CHIEF COMPLAINT / HPI:  ? ?Elevated CBG readings after steroid joint injection: ?She had an injection of the left lateral hip greater trochanter bursa on 4/28. She noticed her CBGs elevated at 495 the day after and improving since then. This morning it was back to 139.  She states prior to the steroid injection her sugars have been in the normal range. ? ?Diabetic Follow Up: ?Patient is a 66 y.o. female who present today for diabetic follow up.  ? ?Patient endorses no problems ? ?Home medications include: metformin, victoza, basaglar 10u, sliding scale with meals. ?Patient endorses taking these medications as prescribed. ? ?Most recent A1Cs:  ?Lab Results  ?Component Value Date  ? HGBA1C 8.5 (A) 11/09/2021  ? HGBA1C 7.1 (A) 06/20/2021  ? HGBA1C 8.2 (A) 03/10/2021  ? ?Last Microalbumin, LDL, Creatinine: ?Lab Results  ?Component Value Date  ? LDLCALC 104 (H) 10/27/2020  ? CREATININE 0.71 05/13/2021  ? ?Patient does check blood glucose on a regular basis. ? ? ?ASL interpretor used for encounter   ? ? ?PERTINENT  PMH / PSH: T2DM ? ?OBJECTIVE:  ? ?BP (!) 116/59   Pulse 89   Wt 125 lb 4 oz (56.8 kg)   SpO2 99%   BMI 21.50 kg/m?   ? ?General: NAD, pleasant, able to participate in exam ?Cardiac: RRR, no murmurs. ?Respiratory: CTAB, normal effort, No wheezes, rales or rhonchi ?Neuro: alert, no obvious focal deficits ?Psych: Normal affect and mood ? ?ASSESSMENT/PLAN:  ? ?Type 2 diabetes: ?A1c worsened from 7.1-8.5, however this was in the setting of 2 different steroid injections since last A1c.  Her sugars have been at a good range for her prior to the steroid injections and have returned to 139 with fasting glucose this morning.  I suspect that her A1c has worsened due to the steroid injections and with the morning glucose of 139 and the patient being on top of monitoring her glucose closely I do not think we need to make any changes at this time.  Recommended her follow-up in 3 months for an A1c  check unless she sees morning glucose levels approaching the 200 or higher range.  If that is the case she is going to follow-up sooner so we can make changes to her medications.  No refills needed at this time. ? ?Lurline Del, DO ?[place]  ? ? ? ?

## 2021-11-09 ENCOUNTER — Ambulatory Visit (INDEPENDENT_AMBULATORY_CARE_PROVIDER_SITE_OTHER): Payer: Medicare Other | Admitting: Family Medicine

## 2021-11-09 VITALS — BP 116/59 | HR 89 | Wt 125.2 lb

## 2021-11-09 DIAGNOSIS — E11311 Type 2 diabetes mellitus with unspecified diabetic retinopathy with macular edema: Secondary | ICD-10-CM | POA: Diagnosis not present

## 2021-11-09 LAB — POCT GLYCOSYLATED HEMOGLOBIN (HGB A1C): HbA1c, POC (controlled diabetic range): 8.5 % — AB (ref 0.0–7.0)

## 2021-11-09 NOTE — Patient Instructions (Signed)
Your A1c worsened from 7.1-8.5 but I think this is due to the steroid injections.  I want you to continue your current medications and watch her morning blood sugars.  If you are seeing blood sugars in the morning in the 200 or higher range please see [name] back to make changes on your diabetic medications, if you do not see numbers above 200 in the morning we can continue on your current medicines and we will see what your true A1c is at the next visit in 3 months.  If you need anything in the meantime please let us know. ?

## 2021-11-10 DIAGNOSIS — H35373 Puckering of macula, bilateral: Secondary | ICD-10-CM | POA: Diagnosis not present

## 2021-11-10 DIAGNOSIS — H31093 Other chorioretinal scars, bilateral: Secondary | ICD-10-CM | POA: Diagnosis not present

## 2021-11-10 DIAGNOSIS — E103592 Type 1 diabetes mellitus with proliferative diabetic retinopathy without macular edema, left eye: Secondary | ICD-10-CM | POA: Diagnosis not present

## 2021-11-10 DIAGNOSIS — E103593 Type 1 diabetes mellitus with proliferative diabetic retinopathy without macular edema, bilateral: Secondary | ICD-10-CM | POA: Diagnosis not present

## 2021-11-10 DIAGNOSIS — H35033 Hypertensive retinopathy, bilateral: Secondary | ICD-10-CM | POA: Diagnosis not present

## 2021-11-15 ENCOUNTER — Telehealth: Payer: Self-pay | Admitting: Family Medicine

## 2021-11-15 DIAGNOSIS — R29898 Other symptoms and signs involving the musculoskeletal system: Secondary | ICD-10-CM

## 2021-11-15 DIAGNOSIS — M5010 Cervical disc disorder with radiculopathy, unspecified cervical region: Secondary | ICD-10-CM

## 2021-11-15 NOTE — Telephone Encounter (Signed)
[facility] called with BL arm tingling and decreased arm and hand strength.  ? ?ESI did not help.  ? ?Plan to repeat ESI and refer to Neurosurgery.  ? ?Used ASL interperter via phone app  ? ? ?

## 2021-11-16 ENCOUNTER — Encounter: Payer: Self-pay | Admitting: Family Medicine

## 2021-11-22 ENCOUNTER — Ambulatory Visit
Admission: RE | Admit: 2021-11-22 | Discharge: 2021-11-22 | Disposition: A | Discharge status: Home / Self Care | Payer: Medicare Other | Source: Ambulatory Visit | Attending: Family Medicine | Admitting: Family Medicine

## 2021-11-22 DIAGNOSIS — M5010 Cervical disc disorder with radiculopathy, unspecified cervical region: Secondary | ICD-10-CM

## 2021-11-22 DIAGNOSIS — R29898 Other symptoms and signs involving the musculoskeletal system: Secondary | ICD-10-CM

## 2021-11-22 DIAGNOSIS — M47812 Spondylosis without myelopathy or radiculopathy, cervical region: Secondary | ICD-10-CM | POA: Diagnosis not present

## 2021-11-22 MED ORDER — IOPAMIDOL (ISOVUE-M 300) INJECTION 61%
1.0000 mL | Freq: Once | INTRAMUSCULAR | Status: AC
  Administered 2021-11-22: 1 mL via EPIDURAL

## 2021-11-22 MED ORDER — TRIAMCINOLONE ACETONIDE 40 MG/ML IJ SUSP (RADIOLOGY)
60.0000 mg | Freq: Once | INTRAMUSCULAR | Status: AC
  Administered 2021-11-22: 60 mg via EPIDURAL

## 2021-11-22 NOTE — Discharge Instructions (Signed)

## 2021-11-29 NOTE — Progress Notes (Deleted)
   I, [name], LAT, ATC, am serving as scribe for Dr. Lynne Leader.  DAYLYN CHRISTINE is a 66 y.o. female who presents to [place] at [facility] today for f/u of R knee/lower leg and L hip pain.  She was last seen by Dr. Georgina Snell on 10/28/21 after suffering a fall on 10/23/21 and had a L GT steroid injection.  She was advised to proceed w/ previously-ordered PT but has not completed any visits.  Today, pt reports   Diagnostic imaging: R knee and L hip XR- 10/28/21  Pertinent review of systems: ***  Relevant historical information: ***   Exam:  There were no vitals taken for this visit. General: Well Developed, well nourished, and in no acute distress.   MSK: ***    Lab and Radiology Results No results found. However, due to the size of the patient record, not all encounters were searched. Please check Results Review for a complete set of results. No results found.     Assessment and Plan: 66 y.o. female with ***   PDMP not reviewed this encounter. No orders of the defined types were placed in this encounter.  No orders of the defined types were placed in this encounter.    Discussed warning signs or symptoms. Please see discharge instructions. Patient expresses understanding.   ***

## 2021-11-30 ENCOUNTER — Ambulatory Visit: Payer: Medicare Other | Admitting: Family Medicine

## 2021-12-01 ENCOUNTER — Ambulatory Visit: Payer: Medicare Other | Attending: Podiatry | Admitting: Physical Therapy

## 2021-12-01 DIAGNOSIS — R2689 Other abnormalities of gait and mobility: Secondary | ICD-10-CM | POA: Insufficient documentation

## 2021-12-01 DIAGNOSIS — R2681 Unsteadiness on feet: Secondary | ICD-10-CM | POA: Diagnosis not present

## 2021-12-01 DIAGNOSIS — M6281 Muscle weakness (generalized): Secondary | ICD-10-CM | POA: Diagnosis not present

## 2021-12-01 DIAGNOSIS — G8929 Other chronic pain: Secondary | ICD-10-CM | POA: Diagnosis not present

## 2021-12-01 DIAGNOSIS — M5441 Lumbago with sciatica, right side: Secondary | ICD-10-CM | POA: Diagnosis not present

## 2021-12-01 DIAGNOSIS — M5442 Lumbago with sciatica, left side: Secondary | ICD-10-CM | POA: Diagnosis not present

## 2021-12-01 NOTE — Therapy (Signed)
OUTPATIENT PHYSICAL THERAPY LOWER EXTREMITY EVALUATION   Patient Name: Alicia [name] MRN: [id number] DOB:1955-11-18, 66 y.o., female Today's Date: 12/01/2021   PT End of Session - 12/01/21 1109     Visit Number 1    Date for PT Re-Evaluation 01/26/22   1-2x/week   Authorization Type MCR - FOTO    Progress Note Due on Visit 10    PT Start Time 0915    PT Stop Time 0958    PT Time Calculation (min) 43 min             Past Medical History:  Diagnosis Date   Anemia    ARTHRITIS, KNEE 09/17/2007   Asthma 04/04/2010   Cataract 01/07/2019   Closed fracture of distal end of left radius 11/01/2015   Complete deafness    Meningitis at age 42   Deaf    Diabetes mellitus    Diabetic neuropathy ([place])    Ganglion cyst 06/22/2011   Gastroparesis    GERD (gastroesophageal reflux disease)    H/O: C-section    Hyperlipidemia    Hypertension    Neuromuscular disorder ([place])    diabetic neuropathy   PSVT (paroxysmal supraventricular tachycardia) ([place]) 11/09/2017   Event monitor 09/14/2017 - Predominantly sinus rhythm with episodes of narrow-complex tachycardia suggestive of paroxysmal supraventricular tachycardia.   S/P appy    Past Surgical History:  Procedure Laterality Date   APPENDECTOMY     cardiolyte EF 77%, no ischemia in 2006  2006   [place] Right 04/20/2015   Procedure: RIGHT CARPAL TUNNEL RELEASE;  Surgeon: Daryll Brod, MD;  Location: [facility];  Service: Orthopedics;  Laterality: Right;   CARPAL TUNNEL RELEASE Left 11/04/2015   Procedure: CARPAL TUNNEL RELEASE;  Surgeon: Daryll Brod, MD;  Location: [facility];  Service: Orthopedics;  Laterality: Left;   CESAREAN SECTION     OPEN REDUCTION INTERNAL FIXATION (ORIF) DISTAL RADIAL FRACTURE Left 11/04/2015   Procedure: OPEN REDUCTION INTERNAL FIXATION (ORIF) LEFT DISTAL RADIAL FRACTURE POSSIBLE BONE GRAFT;  Surgeon: Daryll Brod, MD;  Location: [facility];  Service: Orthopedics;   Laterality: Left;   TRIGGER FINGER RELEASE Right 04/20/2015   Procedure: RELEASE TRIGGER FINGER/A-1 PULLEY RIGHT MIDDLE FINGER,RIGHT RING FINGER;  Surgeon: Daryll Brod, MD;  Location: [facility];  Service: Orthopedics;  Laterality: Right;   TUBAL LIGATION     ULNAR NERVE TRANSPOSITION Right 04/20/2015   Procedure: RIGHT ULNAR NERVE DECOMPRESSION;  Surgeon: Daryll Brod, MD;  Location: [facility];  Service: Orthopedics;  Laterality: Right;   UTERINE FIBROID EMBOLIZATION  2007   WRIST SURGERY     Cyst removed on left   WRIST SURGERY Right 1985   tendon repair R wrist   Patient Active Problem List   Diagnosis Date Noted   Cervical disc disorder with radiculopathy of cervical region 10/28/2021   Sternocleidomastoid muscle tenderness 09/26/2021   Musculoskeletal disorder involving upper trapezius muscle 08/05/2021   Back pain 06/20/2021   Urinary hesitancy 05/13/2021   Hemorrhoids 02/03/2021   Irritable bowel syndrome 11/22/2020   Acute viral sinusitis 11/12/2020   Bilateral sciatica 10/27/2020   Greater trochanteric bursitis of both hips 10/27/2020   Chronic idiopathic constipation 06/25/2020   Elevated blood sugar 06/10/2020   Hernia of abdominal wall 01/28/2020   Spinal stenosis at L4-L5 level 01/12/2020   Transient neurologic deficit 12/22/2019   Frequent loose stools 04/20/2019   Hyperthyroidism 03/07/2019   Hypertension 01/07/2019   Hypercalcemia 10/08/2018  Seasonal allergies 08/27/2018   Osteoarthritis involving multiple joints on both sides of body 05/25/2018   Statin intolerance 12/26/2017   PSVT (paroxysmal supraventricular tachycardia) ([place]) 11/09/2017   Chest pain 08/30/2017   Hyperlipidemia 08/30/2017   OSA (obstructive sleep apnea) 08/31/2016   Healthcare maintenance 03/07/2016   Diabetes ([place]) 09/14/2015   Muscle pain 09/28/2014   Seizure ([place]) 06/08/2014   Iron deficiency anemia 07/01/2012   Diabetic retinopathy ([place]) 07/21/2011    GERD 12/30/2007   Diabetic polyneuropathy ([place]) 08/30/2006   Type 2 diabetes mellitus with ophthalmic complication ([place]) 27/12/2374   HYPERCHOLESTEROLEMIA 08/30/2006   Hearing loss 08/30/2006    PCP: Eulis Foster, MD  REFERRING PROVIDER: Trula Slade, DPM  THERAPY DIAG:  Muscle weakness - Plan: PT plan of care cert/re-cert  Chronic bilateral low back pain with bilateral sciatica - Plan: PT plan of care cert/re-cert  Unsteadiness on feet - Plan: PT plan of care cert/re-cert  Other abnormalities of gait and mobility - Plan: PT plan of care cert/re-cert  REFERRING DIAG: Referral diagnosis: Gait instability [R26.81]  Rationale for Evaluation and Treatment Rehabilitation  SUBJECTIVE:  PERTINENT PAST HISTORY:  Diabetic neuropathy, back problems with sciatica (pain, no numbness), R knee pain, L hip pain, spinal stenosis, osteoporosis       PRECAUTIONS: Fall  WEIGHT BEARING RESTRICTIONS No  FALLS:  Has patient fallen in last 6 months? Yes, Number of falls: 2-3x (1x/month)  MOI/History of condition:  Onset date: 2016 but getting worse in last month  Alicia [name] is a 66 y.o. female who presents to clinic with chief complaint of loss of balance with frequent falls.  She denies dizziness.  She feels like she catches he feet on things.  She has poor eyesight, particularly at night.  She does not exercise.  She feels her legs will "give out".   Red flags:  denies   Pain:  Are you having pain? Yes Pain location: low back pain NPRS scale:  current 5/10  Aggravating factors: activity  NPRS, highest: 7/10 Relieving factors: sitting/resting  NPRS: best: 3/10 Pain description: constant, sharp, and aching  Occupation: Geographical information systems officer: cane  Patient Goals/Specific Activities: improve balance   OBJECTIVE:    X-Ray   X-ray images left hip and right knee obtained today personally and independently interpreted   Left hip: No acute fractures  are present.  No significant left hip degenerative changes are present. Rounded calcified disc disease present in the pelvis likely represent uterine fibroids.   Right knee: Superior patellar pole osteophyte.  Minimal degenerative changes.  No acute fractures are present.   Await formal radiology review   GENERAL OBSERVATION/GAIT:   Antalgic, slow gait using SPC  SENSATION:  Light touch: Appears intact  LE MMT:  MMT Right 12/01/2021 Left 12/01/2021  Hip flexion (L2, L3) 3 3  Knee extension (L3) 3+ 3+  Knee flexion 3+ 3+  Hip abduction 3- 3-  Hip extension 3+ 3  Hip external rotation    Hip internal rotation    Hip adduction    Ankle dorsiflexion (L4)    Ankle plantarflexion (S1) 3x 3x  Ankle inversion    Ankle eversion    Great Toe ext (L5)    Grossly     (Blank rows = not tested, score listed is out of 5 possible points.  N = WNL, D = diminished, C = clear for gross weakness with myotome testing, * = concordant pain with testing)  LE ROM:  ROM Right  12/01/2021 Left 12/01/2021  Hip flexion    Hip extension    Hip abduction    Hip adduction    Hip internal rotation    Hip external rotation    Knee flexion    Knee extension    Ankle dorsiflexion    Ankle plantarflexion    Ankle inversion    Ankle eversion     (Blank rows = not tested, N = WNL, * = concordant pain with testing)  Functional Tests  Eval (12/01/2021)    BERG BALANCE TEST Sitting to Standing: 3.      Stands independently using hands Standing Unsupported: 3.      Stands 2 minutes with supervision Sitting Unsupported: 4.     Sits for 2 minutes independently Standing to Sitting: 1.     Sits independently, but uncontrolled descent  Transfers: 3.     Transfers safely definite use of hands Standing with eyes closed: 3.     Stands 10 seconds with supervision Standing with feet together: 0.     Needs help to attain position and unable to hold for 15 seconds Reaching forward with outstretched arm: 2.      Reaches forward 2 inches Retrieving object from the floor: 3.     Able to pick up with supervision Turning to look behind: 4.     Looks behind from both sides and weight shifts well Turning 360 degrees: 1.     Needs supervision or verbal cueing Place alternate foot on stool: 2.     4 steps without assistance/supervision Standing with one foot in front: 0.     Loses balance while standing/stepping Standing on one foot: 0.     Unable  Total Score: 29/56     30'' STS: 1x  UE used? Y                                                       PATIENT SURVEYS:  Take next visit   TODAY'S TREATMENT: Creating, reviewing, and completing below HEP   PATIENT EDUCATION:  POC, diagnosis, prognosis, HEP, and outcome measures.  Pt educated via explanation, demonstration, and handout (HEP).  Pt confirms understanding verbally.   HOME EXERCISE PROGRAM: Access Code: [email or web address] URL: [email or web address] Date: 12/01/2021 Prepared by: Shearon Balo  Exercises - Hooklying Clamshell with Resistance  - 1 x daily - 7 x weekly - 3 sets - 10 reps - Supine Bridge  - 1 x daily - 7 x weekly - 3 sets - 10 reps - Supine Active Straight Leg Raise  - 1 x daily - 7 x weekly - 3 sets - 10 reps  ASTERISK SIGNS   Asterisk Signs Eval (12/01/2021)       BERG 29       Knee and hip MMT                                  ASSESSMENT:  CLINICAL IMPRESSION: Alicia [name] is a 66 y.o. female who presents to clinic with signs and sxs consistent with significant gait instability secondary to muscle weakness and balance deficits.  No signs of vestibular dysfunction.  Gross LE weakness.  Spinal stenosis may be a contributing factor, but unclear how significant at this time.  OBJECTIVE IMPAIRMENTS: Pain,  gross hip/LE/core weakness, balance, gait  ACTIVITY LIMITATIONS: bending, walking, squatting, stairs, housework  PERSONAL FACTORS: See medical history and pertinent history   REHAB POTENTIAL:  Good  CLINICAL DECISION MAKING: Stable/uncomplicated  EVALUATION COMPLEXITY: Low   GOALS:   SHORT TERM GOALS: Target date: 12/22/2021  Alicia [name] will be >75% HEP compliant to improve carryover between sessions and facilitate independent management of condition  Evaluation (12/01/2021): ongoing Goal status: INITIAL   LONG TERM GOALS: Target date: 01/26/2022  Alicia [name] will improve FOTO score to predicted as a proxy for functional improvement  Goal status: INITIAL   2.  Alicia [name] will imporve BERG balance score to 45 pts, to show a significant improvement in balance in order to reduce fall risk  Evaluation/Baseline (12/01/2021): 29 pts Goal status: INITIAL  See interpretation below:      3.  Alicia [name] will improve the following MMTs to >/= 4/5 to show improvement in strength:  bil LE  Evaluation/Baseline (12/01/2021): see chart in note Goal status: INITIAL   4.  Alicia [name] will report confidence in self management of condition at time of discharge with advanced HEP  Evaluation/Baseline (12/01/2021): unable to self manage Goal status: INITIAL  5.  Alicia [name] will improve 30'' STS (MCID 2) to >/= 5x (w/ UE?: Y) to show improved LE strength and improved transfers   Evaluation/Baseline (12/01/2021): 1x  w/ UE? Y Goal status: INITIAL    PLAN: PT FREQUENCY: 1-2x/week  PT DURATION: 8 weeks (Ending 01/26/2022)  PLANNED INTERVENTIONS: Therapeutic exercises, Aquatic therapy, Therapeutic activity, Neuro Muscular re-education, Gait training, Patient/Family education, Joint mobilization, Dry Needling, Electrical stimulation, Spinal mobilization and/or manipulation, Moist heat, Taping, Vasopneumatic device, Ionotophoresis '4mg'$ /ml Dexamethasone, and Manual therapy  PLAN FOR NEXT SESSION: FOTO, progressive balance, progressive LE/hip/core strength   Shearon Balo PT, DPT 12/01/2021, 12:24 PM

## 2021-12-01 NOTE — Progress Notes (Unsigned)
I, [name], LAT, ATC, am serving as scribe for Dr. Lynne Leader.  Alicia [name] is a 66 y.o. female who presents to [facility] at [facility] today for f/u of R knee/calf and L hip pain and cervical radiculopathy.  She was last seen by Dr. Georgina Snell on 10/28/21 w/ these c/o after suffering 2 falls within a few days of each other and had a L hip GT steroid injection.  She was also referred to PT for her R knee and L hip and has completed one visit.  She has had 2 cervical ESI on 11/22/21 (L C7-T1) and 11/01/21 (L C7).  Today, pt reports neck pain has continued, L-sided. Pt reports no relief from prior ESI and feels worse than before. Pt is losing strength in her L arm/hand and is dropping things.  She has an appointment scheduled with [place] [facility] in mid June.  Radiating pain: yes- to L hand/fingers Numbness/tingling: yes  Right knee pain: Patient was seen last visit for knee pain as well.  She notes it still painful although it has improved since the last visit.  She would be willing to have an injection today.  She notes her left hip has improved after injection last visit.  Diagnostic testing: R knee and L hip XR- 10/28/21; C-spine MRI- 10/16/21; C-spine XR- 09/26/21  Pertinent review of systems: No fevers or chills  Relevant historical information: Diabetes   Exam:  BP (!) 102/58   Pulse 96   Ht '5\' 4"'$  (1.626 m)   Wt 126 lb (57.2 kg)   SpO2 96%   BMI 21.63 kg/m  General: Well Developed, well nourished, and in no acute distress.   MSK: C-spine decreased cervical motion. Upper extremity strength is diminished left arm.  Right knee: Mild effusion normal motion with crepitation.  Tender palpation medial joint line.    Lab and Radiology Results  Procedure: Real-time Ultrasound Guided Injection of knee superior lateral patellar space Device: Philips Affiniti 50G Images permanently stored and available for review in PACS Verbal informed consent obtained.   Discussed risks and benefits of procedure. Warned about infection, bleeding, hyperglycemia damage to structures among others. Patient expresses understanding and agreement Time-out conducted.   Noted no overlying erythema, induration, or other signs of local infection.   Skin prepped in a sterile fashion.   Local anesthesia: Topical Ethyl chloride.   With sterile technique and under real time ultrasound guidance: 40 mg of Kenalog and 2 mL of Marcaine injected into knee joint. Fluid seen entering the joint capsule.   Completed without difficulty   Pain immediately resolved suggesting accurate placement of the medication.   Advised to call if fevers/chills, erythema, induration, drainage, or persistent bleeding.   Images permanently stored and available for review in the ultrasound unit.  Impression: Technically successful ultrasound guided injection.    EXAM: RIGHT KNEE 3 VIEWS   COMPARISON:  Radiograph 10/28/2014.   FINDINGS: No fracture or dislocation. Mild tricompartmental osteoarthritis with peripheral spurring. Subchondral cystic change in the superior patella. Mild spurring of the tibial spines. No erosion or bone destruction. No focal bone abnormality. There may be a minimal joint effusion. Quadriceps tendon enthesophyte. Unremarkable soft tissues.   IMPRESSION: 1. Mild tricompartmental osteoarthritis without acute fracture. 2. Possible minimal joint effusion.     Electronically Signed   By: Keith Rake M.D.   On: 10/28/2021 15:54     EXAM: MRI CERVICAL SPINE WITHOUT CONTRAST   TECHNIQUE: Multiplanar, multisequence MR imaging of  the cervical spine was performed. No intravenous contrast was administered.   COMPARISON:  Cervical spine radiographs 09/26/2021.   FINDINGS: Alignment: Combined straightening and lordotic curvature of the cervical spine with subtle anterolisthesis of C4 on C5, stable from last month.   Vertebrae: Normal background bone marrow  signal. Congenital incomplete segmentation of C6 and C7. Patchy degenerative appearing marrow edema involving both the C4-C5 endplates, and left side facets (series 5, image 11). See details of that level below. Lesser patchy degenerative endplate marrow edema at C5-C6.   Cord: Degenerative spinal cord mass effect detailed below. No definite spinal cord signal abnormality.   Posterior Fossa, vertebral arteries, paraspinal tissues: Cervicomedullary junction is within normal limits. Negative visible posterior fossa and brain parenchyma. Preserved major vascular flow voids in the neck. Negative visible neck soft tissues and lung apices.   Disc levels:   C2-C3: Minor disc space loss and foraminal endplate spurring. No significant stenosis.   C3-C4: Mild disc space loss, disc bulging and endplate spurring. Mild facet hypertrophy. Mild spinal stenosis. Mild if any cord mass effect. Mild to moderate left and moderate to severe right C4 foraminal stenosis.   C4-C5: Subtle anterolisthesis. Disc space loss with circumferential disc bulge and endplate spurring eccentric to the left. Broad-based posterior component. Moderate facet and mild ligament flavum hypertrophy greater on the left. Spinal stenosis with mild cord mass effect (series 8, image 15). Severe left and moderate to severe right C5 foraminal stenosis.   C5-C6: Severe disc space loss. Circumferential disc osteophyte complex eccentric to the right. Broad-based posterior component. Mild facet hypertrophy. Mild spinal stenosis and spinal cord mass effect (series 8, image 18). Severe bilateral C6 foraminal stenosis.   C6-C7:  Incomplete segmentation.  Otherwise negative.   C7-T1: Mild facet, ligament flavum hypertrophy and endplate spurring. No stenosis.   Up to mild visible upper thoracic spinal stenosis at T2-T3 related to the mild central disc bulging and moderate posterior element hypertrophy.   IMPRESSION: 1.  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 Severely degenerated C5-C6 and C4-C5 levels. Mild anterolisthesis at the latter with some associated acute exacerbation of chronic left side facet arthropathy at C4-C5.   2. Mild multifactorial spinal stenosis at both levels with spinal cord mass effect but no spinal cord signal abnormality. Moderate to severe left greater than right C5 and bilateral C6 neural foraminal stenosis.   3. Mild spinal stenosis also at C3-C4 with moderate to severe right C4 foraminal stenosis.   4. Possible mild degenerative upper thoracic spinal stenosis at T2-T3.     Electronically Signed   By: Genevie Ann M.D.   On: 10/17/2021 09:51   I, Lynne Leader, personally (independently) visualized and performed the interpretation of the images attached in this note.   Assessment and Plan: 66 y.o. female with left cervical radiculopathy with weakness.  Unfortunately not improving after 2 epidural steroid injections.  I am not optimistic that a 3rd injection would help very much. She already has an appointment scheduled with [place] [facility] in about 2 weeks for initial surgical consultation.  I think is very likely that given her weakness and failure to respond to epidural steroid injections x2 surgeon will recommend surgery.  If not check back with me.  Right knee pain: Plan for steroid injection today.  Check back as needed.  Continue quad strengthening exercises.  Left lateral hip pain: Improved after injection.  Continue strengthening exercises.   PDMP not reviewed this encounter. Orders Placed This Encounter  Procedures  [name] LIMITED JOINT SPACE STRUCTURES LOW RIGHT(NO LINKED CHARGES)    Order Specific Question:   Reason for Exam (SYMPTOM  OR DIAGNOSIS REQUIRED)    Answer:   right knee pain    Order Specific Question:   Preferred imaging location?    Answer:   [facility]   No orders of the defined types were placed  in this encounter.    Discussed warning signs or symptoms. Please see discharge instructions. Patient expresses understanding.   The above documentation has been reviewed and is accurate and complete Lynne Leader, M.D.

## 2021-12-02 ENCOUNTER — Ambulatory Visit: Payer: Self-pay

## 2021-12-02 ENCOUNTER — Ambulatory Visit (INDEPENDENT_AMBULATORY_CARE_PROVIDER_SITE_OTHER): Payer: Medicare Other | Admitting: Family Medicine

## 2021-12-02 VITALS — BP 102/58 | HR 96 | Ht 64.0 in | Wt 126.0 lb

## 2021-12-02 DIAGNOSIS — M5010 Cervical disc disorder with radiculopathy, unspecified cervical region: Secondary | ICD-10-CM | POA: Diagnosis not present

## 2021-12-02 DIAGNOSIS — M25552 Pain in left hip: Secondary | ICD-10-CM | POA: Diagnosis not present

## 2021-12-02 DIAGNOSIS — M25561 Pain in right knee: Secondary | ICD-10-CM

## 2021-12-02 DIAGNOSIS — R29898 Other symptoms and signs involving the musculoskeletal system: Secondary | ICD-10-CM

## 2021-12-02 NOTE — Patient Instructions (Addendum)
Thank you for coming in today.   You received a steroid injection in your right knee today. Seek immediate medical attention if the joint becomes red, extremely painful, or is oozing fluid.   Check back as needed

## 2021-12-06 ENCOUNTER — Ambulatory Visit: Payer: Medicare Other | Admitting: Family Medicine

## 2021-12-13 ENCOUNTER — Encounter: Payer: Self-pay | Admitting: Physical Therapy

## 2021-12-13 ENCOUNTER — Ambulatory Visit: Payer: Medicare Other | Admitting: Physical Therapy

## 2021-12-13 DIAGNOSIS — R2681 Unsteadiness on feet: Secondary | ICD-10-CM | POA: Diagnosis not present

## 2021-12-13 DIAGNOSIS — M5442 Lumbago with sciatica, left side: Secondary | ICD-10-CM | POA: Diagnosis not present

## 2021-12-13 DIAGNOSIS — M5441 Lumbago with sciatica, right side: Secondary | ICD-10-CM | POA: Diagnosis not present

## 2021-12-13 DIAGNOSIS — G8929 Other chronic pain: Secondary | ICD-10-CM | POA: Diagnosis not present

## 2021-12-13 DIAGNOSIS — M6281 Muscle weakness (generalized): Secondary | ICD-10-CM

## 2021-12-13 DIAGNOSIS — M5412 Radiculopathy, cervical region: Secondary | ICD-10-CM | POA: Diagnosis not present

## 2021-12-13 DIAGNOSIS — R2689 Other abnormalities of gait and mobility: Secondary | ICD-10-CM | POA: Diagnosis not present

## 2021-12-13 NOTE — Therapy (Signed)
OUTPATIENT PHYSICAL THERAPY TREATMENT NOTE   Patient Name: Alicia [name] MRN: [id number] DOB:[birth year], 66 y.o., female Today's Date: 12/13/2021  PCP: Eulis Foster, MD REFERRING PROVIDER: Trula Slade, DPM   PT End of Session - 12/13/21 1403     Visit Number 2    Date for PT Re-Evaluation 01/26/22   1-2x/week   Authorization Type MCR - FOTO    Progress Note Due on Visit 10    PT Start Time 9833    PT Stop Time 8250    PT Time Calculation (min) 40 min             Past Medical History:  Diagnosis Date   Anemia    ARTHRITIS, KNEE 09/17/2007   Asthma 04/04/2010   Cataract 01/07/2019   Closed fracture of distal end of left radius 11/01/2015   Complete deafness    Meningitis at age 19   Deaf    Diabetes mellitus    Diabetic neuropathy ([place])    Ganglion cyst 06/22/2011   Gastroparesis    GERD (gastroesophageal reflux disease)    H/O: C-section    Hyperlipidemia    Hypertension    Neuromuscular disorder ([place])    diabetic neuropathy   PSVT (paroxysmal supraventricular tachycardia) ([place]) 11/09/2017   Event monitor 09/14/2017 - Predominantly sinus rhythm with episodes of narrow-complex tachycardia suggestive of paroxysmal supraventricular tachycardia.   S/P appy    Past Surgical History:  Procedure Laterality Date   APPENDECTOMY     cardiolyte EF 77%, no ischemia in 2006  2006   [place] Right 04/20/2015   Procedure: RIGHT CARPAL TUNNEL RELEASE;  Surgeon: Daryll Brod, MD;  Location: [facility];  Service: Orthopedics;  Laterality: Right;   CARPAL TUNNEL RELEASE Left 11/04/2015   Procedure: CARPAL TUNNEL RELEASE;  Surgeon: Daryll Brod, MD;  Location: [facility];  Service: Orthopedics;  Laterality: Left;   CESAREAN SECTION     OPEN REDUCTION INTERNAL FIXATION (ORIF) DISTAL RADIAL FRACTURE Left 11/04/2015   Procedure: OPEN REDUCTION INTERNAL FIXATION (ORIF) LEFT DISTAL RADIAL FRACTURE POSSIBLE BONE GRAFT;  Surgeon: Daryll Brod, MD;  Location: [facility];  Service: Orthopedics;  Laterality: Left;   TRIGGER FINGER RELEASE Right 04/20/2015   Procedure: RELEASE TRIGGER FINGER/A-1 PULLEY RIGHT MIDDLE FINGER,RIGHT RING FINGER;  Surgeon: Daryll Brod, MD;  Location: [facility];  Service: Orthopedics;  Laterality: Right;   TUBAL LIGATION     ULNAR NERVE TRANSPOSITION Right 04/20/2015   Procedure: RIGHT ULNAR NERVE DECOMPRESSION;  Surgeon: Daryll Brod, MD;  Location: [facility];  Service: Orthopedics;  Laterality: Right;   UTERINE FIBROID EMBOLIZATION  2007   WRIST SURGERY     Cyst removed on left   WRIST SURGERY Right 1985   tendon repair R wrist   Patient Active Problem List   Diagnosis Date Noted   Cervical disc disorder with radiculopathy of cervical region 10/28/2021   Sternocleidomastoid muscle tenderness 09/26/2021   Musculoskeletal disorder involving upper trapezius muscle 08/05/2021   Back pain 06/20/2021   Urinary hesitancy 05/13/2021   Hemorrhoids 02/03/2021   Irritable bowel syndrome 11/22/2020   Acute viral sinusitis 11/12/2020   Bilateral sciatica 10/27/2020   Greater trochanteric bursitis of both hips 10/27/2020   Chronic idiopathic constipation 06/25/2020   Elevated blood sugar 06/10/2020   Hernia of abdominal wall 01/28/2020   Spinal stenosis at L4-L5 level 01/12/2020   Transient neurologic deficit 12/22/2019   Frequent loose stools 04/20/2019  Hyperthyroidism 03/07/2019   Hypertension 01/07/2019   Hypercalcemia 10/08/2018   Seasonal allergies 08/27/2018   Osteoarthritis involving multiple joints on both sides of body 05/25/2018   Statin intolerance 12/26/2017   PSVT (paroxysmal supraventricular tachycardia) ([place]) 11/09/2017   Chest pain 08/30/2017   Hyperlipidemia 08/30/2017   OSA (obstructive sleep apnea) 08/31/2016   Healthcare maintenance 03/07/2016   Diabetes ([place]) 09/14/2015   Muscle pain 09/28/2014   Seizure ([place]) 06/08/2014   Iron  deficiency anemia 07/01/2012   Diabetic retinopathy ([place]) 07/21/2011   GERD 12/30/2007   Diabetic polyneuropathy ([place]) 08/30/2006   Type 2 diabetes mellitus with ophthalmic complication ([place]) 49/67/5916   HYPERCHOLESTEROLEMIA 08/30/2006   Hearing loss 08/30/2006    THERAPY DIAG:  Muscle weakness  Chronic bilateral low back pain with bilateral sciatica  Unsteadiness on feet  REFERRING DIAG: Referral diagnosis: Gait instability [R26.81]  PERTINENT HISTORY: Diabetic neuropathy, back problems with sciatica (pain, no numbness), R knee pain, L hip pain, spinal stenosis, osteoporosis   PRECAUTIONS/RESTRICTIONS:   FALL!!!  SUBJECTIVE:  Pt reports that she is doing fair.  She had a MD appt and was told that she needs cervical and lumbar surgery.  Pain:  Are you having pain? Yes Pain location: low back pain NPRS scale:  current 5/10  Aggravating factors: activity           NPRS, highest: 7/10 Relieving factors: sitting/resting           NPRS: best: 3/10 Pain description: constant, sharp, and aching  OBJECTIVE:  X-Ray             X-ray images left hip and right knee obtained today personally and independently interpreted   Left hip: No acute fractures are present.  No significant left hip degenerative changes are present. Rounded calcified disc disease present in the pelvis likely represent uterine fibroids.   Right knee: Superior patellar pole osteophyte.  Minimal degenerative changes.  No acute fractures are present.   Await formal radiology review             GENERAL OBSERVATION/GAIT:                     Antalgic, slow gait using SPC   SENSATION:          Light touch: Appears intact   LE MMT:   MMT Right 12/01/2021 Left 12/01/2021  Hip flexion (L2, L3) 3 3  Knee extension (L3) 3+ 3+  Knee flexion 3+ 3+  Hip abduction 3- 3-  Hip extension 3+ 3  Hip external rotation      Hip internal rotation      Hip adduction      Ankle dorsiflexion (L4)      Ankle  plantarflexion (S1) 3x 3x  Ankle inversion      Ankle eversion      Great Toe ext (L5)      Grossly        (Blank rows = not tested, score listed is out of 5 possible points.  N = WNL, D = diminished, C = clear for gross weakness with myotome testing, * = concordant pain with testing)   LE ROM:   ROM Right 12/01/2021 Left 12/01/2021  Hip flexion      Hip extension      Hip abduction      Hip adduction      Hip internal rotation      Hip external rotation      Knee flexion  Knee extension      Ankle dorsiflexion      Ankle plantarflexion      Ankle inversion      Ankle eversion        (Blank rows = not tested, N = WNL, * = concordant pain with testing)   Functional Tests   Eval (12/01/2021)      BERG BALANCE TEST Sitting to Standing: 3.      Stands independently using hands Standing Unsupported: 3.      Stands 2 minutes with supervision Sitting Unsupported: 4.     Sits for 2 minutes independently Standing to Sitting: 1.     Sits independently, but uncontrolled descent  Transfers: 3.     Transfers safely definite use of hands Standing with eyes closed: 3.     Stands 10 seconds with supervision Standing with feet together: 0.     Needs help to attain position and unable to hold for 15 seconds Reaching forward with outstretched arm: 2.     Reaches forward 2 inches Retrieving object from the floor: 3.     Able to pick up with supervision Turning to look behind: 4.     Looks behind from both sides and weight shifts well Turning 360 degrees: 1.     Needs supervision or verbal cueing Place alternate foot on stool: 2.     4 steps without assistance/supervision Standing with one foot in front: 0.     Loses balance while standing/stepping Standing on one foot: 0.     Unable   Total Score: 29/56        30'' STS: 1x  UE used? Y                                                                                                PATIENT SURVEYS:  Take next visit      TODAY'S TREATMENT: Creating, reviewing, and completing below HEP     PATIENT EDUCATION:  POC, diagnosis, prognosis, HEP, and outcome measures.  Pt educated via explanation, demonstration, and handout (HEP).  Pt confirms understanding verbally.    HOME EXERCISE PROGRAM: Access Code: [email or web address] URL: [email or web address] Date: 12/01/2021 Prepared by: Shearon Balo   Exercises - Hooklying Clamshell with Resistance  - 1 x daily - 7 x weekly - 3 sets - 10 reps - Supine Bridge  - 1 x daily - 7 x weekly - 3 sets - 10 reps - Supine Active Straight Leg Raise  - 1 x daily - 7 x weekly - 3 sets - 10 reps   ASTERISK SIGNS     Asterisk Signs Eval (12/01/2021)            BERG 29            Knee and hip MMT  TREATMENT 6/13:  Therapeutic Exercise: - nu-step L5 76mwhile taking subjective and planning session with patient - LAQ - 5# - 2x20 - knee ext machine (unable d/t weakness) - alternating SLR on foam roller - alternating - x20 - Sit to stand from raised mat table - 3x10 - bird dog - unable d/t weakness  Neuromuscular re-ed (gait belt): - semi tandem (100%) - feet together on foam - rocker board DF/PF   ASSESSMENT:   CLINICAL IMPRESSION: DNouradid well with therapy today.  She is significantly challenged with balance exercises and requires CGA to Mod A throughout for safety.  She fatigues quickly with therex.  We attempted bird dog, but she was unable to maintain a quadruped position.     OBJECTIVE IMPAIRMENTS: Pain, gross hip/LE/core weakness, balance, gait   ACTIVITY LIMITATIONS: bending, walking, squatting, stairs, housework   PERSONAL FACTORS: See medical history and pertinent history     REHAB POTENTIAL: Good   CLINICAL DECISION MAKING: Stable/uncomplicated   EVALUATION COMPLEXITY: Low     GOALS:     SHORT TERM GOALS: Target date: 12/22/2021   DPakouwill be >75% HEP compliant to  improve carryover between sessions and facilitate independent management of condition   Evaluation (12/01/2021): ongoing Goal status: INITIAL     LONG TERM GOALS: Target date: 01/26/2022   DOndinewill improve FOTO score to predicted as a proxy for functional improvement   Goal status: INITIAL     2.  DSanviwill imporve BERG balance score to 45 pts, to show a significant improvement in balance in order to reduce fall risk   Evaluation/Baseline (12/01/2021): 29 pts Goal status: INITIAL   See interpretation below:         3.  DKamoriwill improve the following MMTs to >/= 4/5 to show improvement in strength:  bil LE   Evaluation/Baseline (12/01/2021): see chart in note Goal status: INITIAL     4.  DZamyahwill report confidence in self management of condition at time of discharge with advanced HEP   Evaluation/Baseline (12/01/2021): unable to self manage Goal status: INITIAL   5.  DAhlamwill improve 30'' STS (MCID 2) to >/= 5x (w/ UE?: Y) to show improved LE strength and improved transfers    Evaluation/Baseline (12/01/2021): 1x  w/ UE? Y Goal status: INITIAL       PLAN: PT FREQUENCY: 1-2x/week   PT DURATION: 8 weeks (Ending 01/26/2022)   PLANNED INTERVENTIONS: Therapeutic exercises, Aquatic therapy, Therapeutic activity, Neuro Muscular re-education, Gait training, Patient/Family education, Joint mobilization, Dry Needling, Electrical stimulation, Spinal mobilization and/or manipulation, Moist heat, Taping, Vasopneumatic device, Ionotophoresis '4mg'$ /ml Dexamethasone, and Manual therapy   PLAN FOR NEXT SESSION: FOTO, progressive balance, progressive LE/hip/core strength   Panayiotis [place] E Pattie Flaharty PT 12/13/2021, 2:57 PM

## 2021-12-14 ENCOUNTER — Other Ambulatory Visit: Payer: Self-pay | Admitting: Neurological Surgery

## 2021-12-15 ENCOUNTER — Ambulatory Visit: Payer: Medicare Other | Admitting: Physical Therapy

## 2021-12-15 ENCOUNTER — Encounter: Payer: Self-pay | Admitting: Physical Therapy

## 2021-12-15 DIAGNOSIS — M6281 Muscle weakness (generalized): Secondary | ICD-10-CM

## 2021-12-15 DIAGNOSIS — R2681 Unsteadiness on feet: Secondary | ICD-10-CM

## 2021-12-15 DIAGNOSIS — R2689 Other abnormalities of gait and mobility: Secondary | ICD-10-CM

## 2021-12-15 DIAGNOSIS — G8929 Other chronic pain: Secondary | ICD-10-CM | POA: Diagnosis not present

## 2021-12-15 DIAGNOSIS — M5441 Lumbago with sciatica, right side: Secondary | ICD-10-CM | POA: Diagnosis not present

## 2021-12-15 DIAGNOSIS — M5442 Lumbago with sciatica, left side: Secondary | ICD-10-CM | POA: Diagnosis not present

## 2021-12-15 NOTE — Therapy (Signed)
OUTPATIENT PHYSICAL THERAPY TREATMENT NOTE   Patient Name: Alicia [name] MRN: [id number] DOB:09/29/1955, 65 y.o., female Today's Date: 12/15/2021  PCP: Simmons-Robinson, Makiera, MD REFERRING PROVIDER: Wagoner, Matthew R, DPM   PT End of Session - 12/15/21 0917     Visit Number 3    Date for PT Re-Evaluation 01/26/22   1-2x/week   Authorization Type MCR - FOTO    Progress Note Due on Visit 10    PT Start Time 0916    PT Stop Time 0957    PT Time Calculation (min) 41 min             Past Medical History:  Diagnosis Date   Anemia    ARTHRITIS, KNEE 09/17/2007   Asthma 04/04/2010   Cataract 01/07/2019   Closed fracture of distal end of left radius 11/01/2015   Complete deafness    Meningitis at age 3   Deaf    Diabetes mellitus    Diabetic neuropathy ([facility])    Ganglion cyst 06/22/2011   Gastroparesis    GERD (gastroesophageal reflux disease)    H/O: C-section    Hyperlipidemia    Hypertension    Neuromuscular disorder ([facility])    diabetic neuropathy   PSVT (paroxysmal supraventricular tachycardia) ([facility]) 11/09/2017   Event monitor 09/14/2017 - Predominantly sinus rhythm with episodes of narrow-complex tachycardia suggestive of paroxysmal supraventricular tachycardia.   S/P appy    Past Surgical History:  Procedure Laterality Date   APPENDECTOMY     cardiolyte EF 77%, no ischemia in 2006  2006   CARPAL TUNNEL RELEASE Right 04/20/2015   Procedure: RIGHT CARPAL TUNNEL RELEASE;  Surgeon: Gary Kuzma, MD;  Location: [facility];  Service: Orthopedics;  Laterality: Right;   CARPAL TUNNEL RELEASE Left 11/04/2015   Procedure: CARPAL TUNNEL RELEASE;  Surgeon: Gary Kuzma, MD;  Location: [facility];  Service: Orthopedics;  Laterality: Left;   CESAREAN SECTION     OPEN REDUCTION INTERNAL FIXATION (ORIF) DISTAL RADIAL FRACTURE Left 11/04/2015   Procedure: OPEN REDUCTION INTERNAL FIXATION (ORIF) LEFT DISTAL RADIAL FRACTURE POSSIBLE BONE GRAFT;  Surgeon: Gary  Kuzma, MD;  Location: [facility];  Service: Orthopedics;  Laterality: Left;   TRIGGER FINGER RELEASE Right 04/20/2015   Procedure: RELEASE TRIGGER FINGER/A-1 PULLEY RIGHT MIDDLE FINGER,RIGHT RING FINGER;  Surgeon: Gary Kuzma, MD;  Location: [facility];  Service: Orthopedics;  Laterality: Right;   TUBAL LIGATION     ULNAR NERVE TRANSPOSITION Right 04/20/2015   Procedure: RIGHT ULNAR NERVE DECOMPRESSION;  Surgeon: Gary Kuzma, MD;  Location: [facility];  Service: Orthopedics;  Laterality: Right;   UTERINE FIBROID EMBOLIZATION  2007   WRIST SURGERY     Cyst removed on left   WRIST SURGERY Right 1985   tendon repair R wrist   Patient Active Problem List   Diagnosis Date Noted   Cervical disc disorder with radiculopathy of cervical region 10/28/2021   Sternocleidomastoid muscle tenderness 09/26/2021   Musculoskeletal disorder involving upper trapezius muscle 08/05/2021   Back pain 06/20/2021   Urinary hesitancy 05/13/2021   Hemorrhoids 02/03/2021   Irritable bowel syndrome 11/22/2020   Acute viral sinusitis 11/12/2020   Bilateral sciatica 10/27/2020   Greater trochanteric bursitis of both hips 10/27/2020   Chronic idiopathic constipation 06/25/2020   Elevated blood sugar 06/10/2020   Hernia of abdominal wall 01/28/2020   Spinal stenosis at L4-L5 level 01/12/2020   Transient neurologic deficit 12/22/2019   Frequent loose stools 04/20/2019     Hyperthyroidism 03/07/2019   Hypertension 01/07/2019   Hypercalcemia 10/08/2018   Seasonal allergies 08/27/2018   Osteoarthritis involving multiple joints on both sides of body 05/25/2018   Statin intolerance 12/26/2017   PSVT (paroxysmal supraventricular tachycardia) ([facility]) 11/09/2017   Chest pain 08/30/2017   Hyperlipidemia 08/30/2017   OSA (obstructive sleep apnea) 08/31/2016   Healthcare maintenance 03/07/2016   Diabetes ([facility]) 09/14/2015   Muscle pain 09/28/2014   Seizure ([facility]) 06/08/2014   Iron  deficiency anemia 07/01/2012   Diabetic retinopathy ([facility]) 07/21/2011   GERD 12/30/2007   Diabetic polyneuropathy ([facility]) 08/30/2006   Type 2 diabetes mellitus with ophthalmic complication ([facility]) 08/30/2006   HYPERCHOLESTEROLEMIA 08/30/2006   Hearing loss 08/30/2006    THERAPY DIAG:  Muscle weakness  Chronic bilateral low back pain with bilateral sciatica  Unsteadiness on feet  Other abnormalities of gait and mobility  REFERRING DIAG: Referral diagnosis: Gait instability [R26.81]  PERTINENT HISTORY: Diabetic neuropathy, back problems with sciatica (pain, no numbness), R knee pain, L hip pain, spinal stenosis, osteoporosis   PRECAUTIONS/RESTRICTIONS:   FALL!!!  SUBJECTIVE:   In person interpreter present throughout  Pt reports that she will have a cervical decompression surgery in late June.  Pain:  Are you having pain? Yes Pain location: low back pain NPRS scale:  current 4/10  Aggravating factors: activity           NPRS, highest: 4/10 Relieving factors: sitting/resting           NPRS: best: 3/10 Pain description: constant, sharp, and aching  OBJECTIVE:  X-Ray             X-ray images left hip and right knee obtained today personally and independently interpreted   Left hip: No acute fractures are present.  No significant left hip degenerative changes are present. Rounded calcified disc disease present in the pelvis likely represent uterine fibroids.   Right knee: Superior patellar pole osteophyte.  Minimal degenerative changes.  No acute fractures are present.   Await formal radiology review             GENERAL OBSERVATION/GAIT:                     Antalgic, slow gait using SPC   SENSATION:          Light touch: Appears intact   LE MMT:   MMT Right 12/01/2021 Left 12/01/2021  Hip flexion (L2, L3) 3 3  Knee extension (L3) 3+ 3+  Knee flexion 3+ 3+  Hip abduction 3- 3-  Hip extension 3+ 3  Hip external rotation      Hip internal rotation      Hip  adduction      Ankle dorsiflexion (L4)      Ankle plantarflexion (S1) 3x 3x  Ankle inversion      Ankle eversion      Great Toe ext (L5)      Grossly        (Blank rows = not tested, score listed is out of 5 possible points.  N = WNL, D = diminished, C = clear for gross weakness with myotome testing, * = concordant pain with testing)   LE ROM:   ROM Right 12/01/2021 Left 12/01/2021  Hip flexion      Hip extension      Hip abduction      Hip adduction      Hip internal rotation      Hip external rotation        Knee flexion      Knee extension      Ankle dorsiflexion      Ankle plantarflexion      Ankle inversion      Ankle eversion        (Blank rows = not tested, N = WNL, * = concordant pain with testing)   Functional Tests   Eval (12/01/2021)      BERG BALANCE TEST Sitting to Standing: 3.      Stands independently using hands Standing Unsupported: 3.      Stands 2 minutes with supervision Sitting Unsupported: 4.     Sits for 2 minutes independently Standing to Sitting: 1.     Sits independently, but uncontrolled descent  Transfers: 3.     Transfers safely definite use of hands Standing with eyes closed: 3.     Stands 10 seconds with supervision Standing with feet together: 0.     Needs help to attain position and unable to hold for 15 seconds Reaching forward with outstretched arm: 2.     Reaches forward 2 inches Retrieving object from the floor: 3.     Able to pick up with supervision Turning to look behind: 4.     Looks behind from both sides and weight shifts well Turning 360 degrees: 1.     Needs supervision or verbal cueing Place alternate foot on stool: 2.     4 steps without assistance/supervision Standing with one foot in front: 0.     Loses balance while standing/stepping Standing on one foot: 0.     Unable   Total Score: 29/56        30'' STS: 1x  UE used? Y                                                                                                 PATIENT SURVEYS:  Take next visit     TODAY'S TREATMENT: Creating, reviewing, and completing below HEP     PATIENT EDUCATION:  POC, diagnosis, prognosis, HEP, and outcome measures.  Pt educated via explanation, demonstration, and handout (HEP).  Pt confirms understanding verbally.    HOME EXERCISE PROGRAM: Access Code: [email or web address] URL: [email or web address] Date: 12/01/2021 Prepared by: Shearon Balo   Exercises - Hooklying Clamshell with Resistance  - 1 x daily - 7 x weekly - 3 sets - 10 reps - Supine Bridge  - 1 x daily - 7 x weekly - 3 sets - 10 reps - Supine Active Straight Leg Raise  - 1 x daily - 7 x weekly - 3 sets - 10 reps   ASTERISK SIGNS     Asterisk Signs Eval (12/01/2021)            BERG 29            Knee and hip MMT  TREATMENT 6/15:  Therapeutic Exercise: - nu-step L5 5m while taking subjective and planning session with patient - knee ext machine 5# - 3x10 - HS curl - 10# - 3x10 - alternating SLR on foam roller - alternating - x20 - Sit to stand from blue chair - 3x10  Neuromuscular re-ed (gait belt): - semi tandem (100%) - feet together on foam - marching on foam - rocker board DF/PF (NT)    TREATMENT 6/13:  Therapeutic Exercise: - nu-step L5 5m while taking subjective and planning session with patient - LAQ - 5# - 2x20 - knee ext machine (unable d/t weakness) - alternating SLR on foam roller - alternating - x20 - Sit to stand from raised mat table - 3x10 - bird dog - unable d/t weakness  Neuromuscular re-ed (gait belt): - semi tandem (100%) - feet together on foam - rocker board DF/PF   ASSESSMENT:   CLINICAL IMPRESSION: Jaena did well with therapy today with no adverse events.  She shows more stable gait today but still requires CGA throughout and MinA at times during balance exercises.  She was able to complete knee ext on machine today showing improved  quad strength today.  Will continue to progress as able.    OBJECTIVE IMPAIRMENTS: Pain, gross hip/LE/core weakness, balance, gait   ACTIVITY LIMITATIONS: bending, walking, squatting, stairs, housework   PERSONAL FACTORS: See medical history and pertinent history     REHAB POTENTIAL: Good   CLINICAL DECISION MAKING: Stable/uncomplicated   EVALUATION COMPLEXITY: Low     GOALS:     SHORT TERM GOALS: Target date: 12/22/2021   Cherree will be >75% HEP compliant to improve carryover between sessions and facilitate independent management of condition   Evaluation (12/01/2021): ongoing Goal status: Met 6/22     LONG TERM GOALS: Target date: 01/26/2022   Malachi will improve FOTO score to predicted as a proxy for functional improvement   Goal status: INITIAL     2.  Jameelah will imporve BERG balance score to 45 pts, to show a significant improvement in balance in order to reduce fall risk   Evaluation/Baseline (12/01/2021): 29 pts Goal status: INITIAL   See interpretation below:         3.  Cameka will improve the following MMTs to >/= 4/5 to show improvement in strength:  bil LE   Evaluation/Baseline (12/01/2021): see chart in note Goal status: INITIAL     4.  Allia will report confidence in self management of condition at time of discharge with advanced HEP   Evaluation/Baseline (12/01/2021): unable to self manage Goal status: INITIAL   5.  Madina will improve 30'' STS (MCID 2) to >/= 5x (w/ UE?: Y) to show improved LE strength and improved transfers    Evaluation/Baseline (12/01/2021): 1x  w/ UE? Y Goal status: INITIAL       PLAN: PT FREQUENCY: 1-2x/week   PT DURATION: 8 weeks (Ending 01/26/2022)   PLANNED INTERVENTIONS: Therapeutic exercises, Aquatic therapy, Therapeutic activity, Neuro Muscular re-education, Gait training, Patient/Family education, Joint mobilization, Dry Needling, Electrical stimulation, Spinal mobilization and/or manipulation, Moist heat, Taping,  Vasopneumatic device, Ionotophoresis 4mg/ml Dexamethasone, and Manual therapy   PLAN FOR NEXT SESSION: FOTO, progressive balance, progressive LE/hip/core strength   Karl E Reinhartsen PT 12/15/2021, 9:57 AM    

## 2021-12-17 ENCOUNTER — Other Ambulatory Visit: Payer: Self-pay | Admitting: Physician Assistant

## 2021-12-20 ENCOUNTER — Encounter: Payer: Self-pay | Admitting: Physical Therapy

## 2021-12-20 ENCOUNTER — Ambulatory Visit: Payer: Medicare Other | Admitting: Physical Therapy

## 2021-12-20 DIAGNOSIS — G8929 Other chronic pain: Secondary | ICD-10-CM | POA: Diagnosis not present

## 2021-12-20 DIAGNOSIS — R2681 Unsteadiness on feet: Secondary | ICD-10-CM | POA: Diagnosis not present

## 2021-12-20 DIAGNOSIS — R2689 Other abnormalities of gait and mobility: Secondary | ICD-10-CM

## 2021-12-20 DIAGNOSIS — M5441 Lumbago with sciatica, right side: Secondary | ICD-10-CM | POA: Diagnosis not present

## 2021-12-20 DIAGNOSIS — M6281 Muscle weakness (generalized): Secondary | ICD-10-CM

## 2021-12-20 DIAGNOSIS — M5442 Lumbago with sciatica, left side: Secondary | ICD-10-CM | POA: Diagnosis not present

## 2021-12-20 NOTE — Therapy (Signed)
OUTPATIENT PHYSICAL THERAPY TREATMENT NOTE   Patient Name: Alicia [name] MRN: [id number] DOB:1955/09/28, 66 y.o., female Today's Date: 12/20/2021  PCP: Ronnald Ramp, MD REFERRING PROVIDER: Vivi Barrack, DPM   PT End of Session - 12/20/21 1258     Visit Number 4    Date for PT Re-Evaluation 01/26/22   1-2x/week   Authorization Type MCR - FOTO    Progress Note Due on Visit 10    PT Start Time 1300    PT Stop Time 1341    PT Time Calculation (min) 41 min             Past Medical History:  Diagnosis Date   Anemia    ARTHRITIS, KNEE 09/17/2007   Asthma 04/04/2010   Cataract 01/07/2019   Closed fracture of distal end of left radius 11/01/2015   Complete deafness    Meningitis at age 40   Deaf    Diabetes mellitus    Diabetic neuropathy (HCC)    Ganglion cyst 06/22/2011   Gastroparesis    GERD (gastroesophageal reflux disease)    H/O: C-section    Hyperlipidemia    Hypertension    Neuromuscular disorder (HCC)    diabetic neuropathy   PSVT (paroxysmal supraventricular tachycardia) (HCC) 11/09/2017   Event monitor 09/14/2017 - Predominantly sinus rhythm with episodes of narrow-complex tachycardia suggestive of paroxysmal supraventricular tachycardia.   S/P appy    Past Surgical History:  Procedure Laterality Date   APPENDECTOMY     cardiolyte EF 77%, no ischemia in 2006  2006   CARPAL TUNNEL RELEASE Right 04/20/2015   Procedure: RIGHT CARPAL TUNNEL RELEASE;  Surgeon: Cindee Salt, MD;  Location: [facility];  Service: Orthopedics;  Laterality: Right;   CARPAL TUNNEL RELEASE Left 11/04/2015   Procedure: CARPAL TUNNEL RELEASE;  Surgeon: Cindee Salt, MD;  Location: [facility];  Service: Orthopedics;  Laterality: Left;   CESAREAN SECTION     OPEN REDUCTION INTERNAL FIXATION (ORIF) DISTAL RADIAL FRACTURE Left 11/04/2015   Procedure: OPEN REDUCTION INTERNAL FIXATION (ORIF) LEFT DISTAL RADIAL FRACTURE POSSIBLE BONE GRAFT;  Surgeon: Cindee Salt, MD;  Location: [facility];  Service: Orthopedics;  Laterality: Left;   TRIGGER FINGER RELEASE Right 04/20/2015   Procedure: RELEASE TRIGGER FINGER/A-1 PULLEY RIGHT MIDDLE FINGER,RIGHT RING FINGER;  Surgeon: Cindee Salt, MD;  Location: [facility];  Service: Orthopedics;  Laterality: Right;   TUBAL LIGATION     ULNAR NERVE TRANSPOSITION Right 04/20/2015   Procedure: RIGHT ULNAR NERVE DECOMPRESSION;  Surgeon: Cindee Salt, MD;  Location: [facility];  Service: Orthopedics;  Laterality: Right;   UTERINE FIBROID EMBOLIZATION  2007   WRIST SURGERY     Cyst removed on left   WRIST SURGERY Right 1985   tendon repair R wrist   Patient Active Problem List   Diagnosis Date Noted   Cervical disc disorder with radiculopathy of cervical region 10/28/2021   Sternocleidomastoid muscle tenderness 09/26/2021   Musculoskeletal disorder involving upper trapezius muscle 08/05/2021   Back pain 06/20/2021   Urinary hesitancy 05/13/2021   Hemorrhoids 02/03/2021   Irritable bowel syndrome 11/22/2020   Acute viral sinusitis 11/12/2020   Bilateral sciatica 10/27/2020   Greater trochanteric bursitis of both hips 10/27/2020   Chronic idiopathic constipation 06/25/2020   Elevated blood sugar 06/10/2020   Hernia of abdominal wall 01/28/2020   Spinal stenosis at L4-L5 level 01/12/2020   Transient neurologic deficit 12/22/2019   Frequent loose stools 04/20/2019  Hyperthyroidism 03/07/2019   Hypertension 01/07/2019   Hypercalcemia 10/08/2018   Seasonal allergies 08/27/2018   Osteoarthritis involving multiple joints on both sides of body 05/25/2018   Statin intolerance 12/26/2017   PSVT (paroxysmal supraventricular tachycardia) ([place]) 11/09/2017   Chest pain 08/30/2017   Hyperlipidemia 08/30/2017   OSA (obstructive sleep apnea) 08/31/2016   Healthcare maintenance 03/07/2016   Diabetes ([place]) 09/14/2015   Muscle pain 09/28/2014   Seizure ([place]) 06/08/2014   Iron  deficiency anemia 07/01/2012   Diabetic retinopathy ([place]) 07/21/2011   GERD 12/30/2007   Diabetic polyneuropathy ([place]) 08/30/2006   Type 2 diabetes mellitus with ophthalmic complication ([place]) 22/63/3354   HYPERCHOLESTEROLEMIA 08/30/2006   Hearing loss 08/30/2006    THERAPY DIAG:  Muscle weakness  Chronic bilateral low back pain with bilateral sciatica  Unsteadiness on feet  Other abnormalities of gait and mobility  REFERRING DIAG: Referral diagnosis: Gait instability [R26.81]  PERTINENT HISTORY: Diabetic neuropathy, back problems with sciatica (pain, no numbness), R knee pain, L hip pain, spinal stenosis, osteoporosis   PRECAUTIONS/RESTRICTIONS:   FALL!!!  SUBJECTIVE:   In person interpreter present throughout  Pt reports that her neck is hurting more today.  Her calves are hurting  Pain:  Are you having pain? Yes Pain location: low back pain NPRS scale:  current 5/10  Aggravating factors: activity           NPRS, highest: 4/10 Relieving factors: sitting/resting           NPRS: best: 3/10 Pain description: constant, sharp, and aching  OBJECTIVE:  X-Ray             X-ray images left hip and right knee obtained today personally and independently interpreted   Left hip: No acute fractures are present.  No significant left hip degenerative changes are present. Rounded calcified disc disease present in the pelvis likely represent uterine fibroids.   Right knee: Superior patellar pole osteophyte.  Minimal degenerative changes.  No acute fractures are present.   Await formal radiology review             GENERAL OBSERVATION/GAIT:                     Antalgic, slow gait using SPC   SENSATION:          Light touch: Appears intact   LE MMT:   MMT Right 12/01/2021 Left 12/01/2021  Hip flexion (L2, L3) 3 3  Knee extension (L3) 3+ 3+  Knee flexion 3+ 3+  Hip abduction 3- 3-  Hip extension 3+ 3  Hip external rotation      Hip internal rotation      Hip adduction       Ankle dorsiflexion (L4)      Ankle plantarflexion (S1) 3x 3x  Ankle inversion      Ankle eversion      Great Toe ext (L5)      Grossly        (Blank rows = not tested, score listed is out of 5 possible points.  N = WNL, D = diminished, C = clear for gross weakness with myotome testing, * = concordant pain with testing)   LE ROM:   ROM Right 12/01/2021 Left 12/01/2021  Hip flexion      Hip extension      Hip abduction      Hip adduction      Hip internal rotation      Hip external rotation  Knee flexion      Knee extension      Ankle dorsiflexion      Ankle plantarflexion      Ankle inversion      Ankle eversion        (Blank rows = not tested, N = WNL, * = concordant pain with testing)   Functional Tests   Eval (12/01/2021)      BERG BALANCE TEST Sitting to Standing: 3.      Stands independently using hands Standing Unsupported: 3.      Stands 2 minutes with supervision Sitting Unsupported: 4.     Sits for 2 minutes independently Standing to Sitting: 1.     Sits independently, but uncontrolled descent  Transfers: 3.     Transfers safely definite use of hands Standing with eyes closed: 3.     Stands 10 seconds with supervision Standing with feet together: 0.     Needs help to attain position and unable to hold for 15 seconds Reaching forward with outstretched arm: 2.     Reaches forward 2 inches Retrieving object from the floor: 3.     Able to pick up with supervision Turning to look behind: 4.     Looks behind from both sides and weight shifts well Turning 360 degrees: 1.     Needs supervision or verbal cueing Place alternate foot on stool: 2.     4 steps without assistance/supervision Standing with one foot in front: 0.     Loses balance while standing/stepping Standing on one foot: 0.     Unable   Total Score: 29/56        30'' STS: 1x  UE used? Y                                                                                                 PATIENT SURVEYS:  Take next visit     TODAY'S TREATMENT: Creating, reviewing, and completing below HEP     PATIENT EDUCATION:  POC, diagnosis, prognosis, HEP, and outcome measures.  Pt educated via explanation, demonstration, and handout (HEP).  Pt confirms understanding verbally.    HOME EXERCISE PROGRAM: Access Code: [email or web address] URL: [email or web address] Date: 12/01/2021 Prepared by: Shearon Balo   Exercises - Hooklying Clamshell with Resistance  - 1 x daily - 7 x weekly - 3 sets - 10 reps - Supine Bridge  - 1 x daily - 7 x weekly - 3 sets - 10 reps - Supine Active Straight Leg Raise  - 1 x daily - 7 x weekly - 3 sets - 10 reps   ASTERISK SIGNS     Asterisk Signs Eval (12/01/2021)            BERG 29            Knee and hip MMT  TREATMENT 6/20:  Therapeutic Exercise: - nu-step L5 25m while taking subjective and planning session with patient - knee ext machine 10# - 3x10 - HS curl - 20# - 3x10 - Sit to stand from blue chair - 2x10  Neuromuscular re-ed (gait belt): - semi tandem (100%) - feet together on foam - marching on foam - heel raises on foam - rocker board DF/PF (NT)  TREATMENT 6/15:  Therapeutic Exercise: - nu-step L5 61m while taking subjective and planning session with patient - knee ext machine 5# - 3x10 - HS curl - 10# - 3x10 - alternating SLR on foam roller - alternating - x20 - Sit to stand from blue chair - 3x10  Neuromuscular re-ed (gait belt): - semi tandem (100%) - feet together on foam - marching on foam - rocker board DF/PF (NT)    TREATMENT 6/13:  Therapeutic Exercise: - nu-step L5 92m while taking subjective and planning session with patient - LAQ - 5# - 2x20 - knee ext machine (unable d/t weakness) - alternating SLR on foam roller - alternating - x20 - Sit to stand from raised mat table - 3x10 - bird dog - unable d/t weakness  Neuromuscular  re-ed (gait belt): - semi tandem (100%) - feet together on foam - rocker board DF/PF   ASSESSMENT:   CLINICAL IMPRESSION: Mollie tolerated session well.  She is very limited d/t pain.  She is unstable in all standing exercises requiring CGA to MinA throughout.  She did have more radicular L LE pain with standing exercises today, but this dissipated with seated rest.  She is scheduled for Cx surgery on 6/26.   OBJECTIVE IMPAIRMENTS: Pain, gross hip/LE/core weakness, balance, gait   ACTIVITY LIMITATIONS: bending, walking, squatting, stairs, housework   PERSONAL FACTORS: See medical history and pertinent history     REHAB POTENTIAL: Good   CLINICAL DECISION MAKING: Stable/uncomplicated   EVALUATION COMPLEXITY: Low     GOALS:     SHORT TERM GOALS: Target date: 12/22/2021   Chantalle will be >75% HEP compliant to improve carryover between sessions and facilitate independent management of condition   Evaluation (12/01/2021): ongoing Goal status: Met 6/22     LONG TERM GOALS: Target date: 01/26/2022   Ameri will improve FOTO score to predicted as a proxy for functional improvement   Goal status: INITIAL     2.  Laporshia will imporve BERG balance score to 45 pts, to show a significant improvement in balance in order to reduce fall risk   Evaluation/Baseline (12/01/2021): 29 pts Goal status: INITIAL   See interpretation below:         3.  Emalea will improve the following MMTs to >/= 4/5 to show improvement in strength:  bil LE   Evaluation/Baseline (12/01/2021): see chart in note Goal status: INITIAL     4.  Rondell will report confidence in self management of condition at time of discharge with advanced HEP   Evaluation/Baseline (12/01/2021): unable to self manage Goal status: INITIAL   5.  Lawan will improve 30'' STS (MCID 2) to >/= 5x (w/ UE?: Y) to show improved LE strength and improved transfers    Evaluation/Baseline (12/01/2021): 1x  w/ UE? Y Goal status: INITIAL        PLAN: PT FREQUENCY: 1-2x/week   PT DURATION: 8 weeks (Ending 01/26/2022)   PLANNED INTERVENTIONS: Therapeutic exercises, Aquatic therapy, Therapeutic activity, Neuro Muscular re-education, Gait training, Patient/Family education, Joint mobilization, Dry Needling, Electrical stimulation, Spinal mobilization and/or manipulation, Moist heat, Taping,  Vasopneumatic device, Ionotophoresis 4mg /ml Dexamethasone, and Manual therapy   PLAN FOR NEXT SESSION: FOTO, progressive balance, progressive LE/hip/core strength   Gleb Mcguire E Vidur Knust PT 12/20/2021, 1:50 PM

## 2021-12-21 ENCOUNTER — Other Ambulatory Visit: Payer: Self-pay | Admitting: Family Medicine

## 2021-12-21 NOTE — Progress Notes (Signed)
Surgical Instructions    Your procedure is scheduled on Monday 12/26/21.  Report to [facility] Main Entrance "A" at 5:30 A.M., then check in with the Admitting office.  Call this number if you have problems the morning of surgery:  [phone]   If you have any questions prior to your surgery date call [phone]: Open Monday-Friday 8am-4pm    Remember:  Do not eat or drink after midnight the night before your surgery     Take these medicines the morning of surgery with A SIP OF WATER:  carvedilol (COREG)  diltiazem (CARDIZEM CD) DULoxetine (CYMBALTA)  famotidine (PEPCID) fluticasone (FLONASE) LINZESS Olopatadine  pantoprazole (PROTONIX)  rosuvastatin (CRESTOR)   IF NEEDED: acetaminophen (TYLENOL)  gabapentin (NEURONTIN)  tiZANidine (ZANAFLEX)  As of today, STOP taking any Aspirin (unless otherwise instructed by your surgeon) Aleve, Naproxen, Ibuprofen, Motrin, Advil, Goody's, BC's, all herbal medications, fish oil, diclofenac Sodium (VOLTAREN), meloxicam (MOBIC) and all vitamins.  WHAT DO I DO ABOUT MY DIABETES MEDICATION?   Do not take oral diabetes medicines (pills) the morning of surgery.      THE MORNING OF SURGERY, take 50% (5 units) of Insulin Glargine [facility]). Do not take liraglutide (VICTOZA) or metFORMIN (GLUCOPHAGE).  The day of surgery, do not take other diabetes injectables, including Byetta (exenatide), Bydureon (exenatide ER), Victoza (liraglutide), or Trulicity (dulaglutide).  If your CBG is greater than 220 mg/dL, you may take  of your sliding scale (Novolog) dose of insulin.   HOW TO MANAGE YOUR DIABETES BEFORE AND AFTER SURGERY  Why is it important to control my blood sugar before and after surgery? Improving blood sugar levels before and after surgery helps healing and can limit problems. A way of improving blood sugar control is eating a healthy diet by:  Eating less sugar and carbohydrates  Increasing activity/exercise  Talking  with your doctor about reaching your blood sugar goals High blood sugars (greater than 180 mg/dL) can raise your risk of infections and slow your recovery, so you will need to focus on controlling your diabetes during the weeks before surgery. Make sure that the doctor who takes care of your diabetes knows about your planned surgery including the date and location.  How do I manage my blood sugar before surgery? Check your blood sugar at least 4 times a day, starting 2 days before surgery, to make sure that the level is not too high or low.  Check your blood sugar the morning of your surgery when you wake up and every 2 hours until you get to the Short Stay unit.  If your blood sugar is less than 70 mg/dL, you will need to treat for low blood sugar: Do not take insulin. Treat a low blood sugar (less than 70 mg/dL) with  cup of clear juice (cranberry or apple), 4 glucose tablets, OR glucose gel. Recheck blood sugar in 15 minutes after treatment (to make sure it is greater than 70 mg/dL). If your blood sugar is not greater than 70 mg/dL on recheck, call [phone] for further instructions. Report your blood sugar to the short stay nurse when you get to Short Stay.  If you are admitted to the hospital after surgery: Your blood sugar will be checked by the staff and you will probably be given insulin after surgery (instead of oral diabetes medicines) to make sure you have good blood sugar levels. The goal for blood sugar control after surgery is 80-180 mg/dL.      Do not wear jewelry  or makeup Do not wear lotions, powders, perfumes/colognes, or deodorant. Do not shave 48 hours prior to surgery.   Do not bring valuables to the hospital. Do not wear nail polish, gel polish, artificial nails, or any other type of covering on natural nails (fingers and toes) If you have artificial nails or gel coating that need to be removed by a nail salon, please have this removed prior to surgery. Artificial  nails or gel coating may interfere with anesthesia's ability to adequately monitor your vital signs.  [facility] is not responsible for any belongings or valuables. .   Do NOT Smoke (Tobacco/Vaping)  24 hours prior to your procedure  If you use a CPAP at night, you may bring your mask for your overnight stay.   Contacts, glasses, hearing aids, dentures or partials may not be worn into surgery, please bring cases for these belongings   For patients admitted to the hospital, discharge time will be determined by your treatment team.   Patients discharged the day of surgery will not be allowed to drive home, and someone needs to stay with them for 24 hours.   SURGICAL WAITING ROOM VISITATION Patients having surgery or a procedure in a hospital may have two support people. Children under the age of 90 must have an adult with them who is not the patient. They may stay in the waiting area during the procedure and may switch out with other visitors. If the patient needs to stay at the hospital during part of their recovery, the visitor guidelines for inpatient rooms apply.  Please refer to the [facility] website for the visitor guidelines for Inpatients (after your surgery is over and you are in a regular room).       Special instructions:    Oral Hygiene is also important to reduce your risk of infection.  Remember - BRUSH YOUR TEETH THE MORNING OF SURGERY WITH YOUR REGULAR TOOTHPASTE   [facility]- Preparing For Surgery  Before surgery, you can play an important role. Because skin is not sterile, your skin needs to be as free of germs as possible. You can reduce the number of germs on your skin by washing with CHG (chlorahexidine gluconate) Soap before surgery.  CHG is an antiseptic cleaner which kills germs and bonds with the skin to continue killing germs even after washing.     Please do not use if you have an allergy to CHG or antibacterial soaps. If your skin becomes  reddened/irritated stop using the CHG.  Do not shave (including legs and underarms) for at least 48 hours prior to first CHG shower. It is OK to shave your face.  Please follow these instructions carefully.     Shower the NIGHT BEFORE SURGERY and the MORNING OF SURGERY with CHG Soap.   If you chose to wash your hair, wash your hair first as usual with your normal shampoo. After you shampoo, rinse your hair and body thoroughly to remove the shampoo.  Then [employer] and genitals (private parts) with your normal soap and rinse thoroughly to remove soap.  After that Use CHG Soap as you would any other liquid soap. You can apply CHG directly to the skin and wash gently with a scrungie or a clean washcloth.   Apply the CHG Soap to your body ONLY FROM THE NECK DOWN.  Do not use on open wounds or open sores. Avoid contact with your eyes, ears, mouth and genitals (private parts). Wash Face and genitals (private parts)  with your normal soap.   Wash thoroughly, paying special attention to the area where your surgery will be performed.  Thoroughly rinse your body with warm water from the neck down.  DO NOT shower/wash with your normal soap after using and rinsing off the CHG Soap.  Pat yourself dry with a CLEAN TOWEL.  Wear CLEAN PAJAMAS to bed the night before surgery  Place CLEAN SHEETS on your bed the night before your surgery  DO NOT SLEEP WITH PETS.   Day of Surgery: Take a shower with CHG soap. Wear Clean/Comfortable clothing the morning of surgery Do not apply any deodorants/lotions.   Remember to brush your teeth WITH YOUR REGULAR TOOTHPASTE.    If you received a COVID test during your pre-op visit, it is requested that you wear a mask when out in public, stay away from anyone that may not be feeling well, and notify your surgeon if you develop symptoms. If you have been in contact with anyone that has tested positive in the last 10 days, please notify your surgeon.    Please read  over the following fact sheets that you were given.

## 2021-12-22 ENCOUNTER — Other Ambulatory Visit (HOSPITAL_COMMUNITY): Payer: Medicare Other

## 2021-12-22 ENCOUNTER — Ambulatory Visit: Payer: Medicare Other

## 2021-12-22 ENCOUNTER — Encounter (HOSPITAL_COMMUNITY): Payer: Self-pay

## 2021-12-22 ENCOUNTER — Encounter (HOSPITAL_COMMUNITY)
Admission: RE | Admit: 2021-12-22 | Discharge: 2021-12-22 | Disposition: A | Discharge status: Home / Self Care | Payer: Medicare Other | Source: Ambulatory Visit | Attending: Neurological Surgery | Admitting: Neurological Surgery

## 2021-12-22 ENCOUNTER — Other Ambulatory Visit: Payer: Self-pay

## 2021-12-22 VITALS — BP 118/74 | HR 94 | Temp 98.0°F | Resp 18 | Ht 64.0 in | Wt 130.6 lb

## 2021-12-22 DIAGNOSIS — G8929 Other chronic pain: Secondary | ICD-10-CM

## 2021-12-22 DIAGNOSIS — E785 Hyperlipidemia, unspecified: Secondary | ICD-10-CM | POA: Insufficient documentation

## 2021-12-22 DIAGNOSIS — I10 Essential (primary) hypertension: Secondary | ICD-10-CM | POA: Insufficient documentation

## 2021-12-22 DIAGNOSIS — E119 Type 2 diabetes mellitus without complications: Secondary | ICD-10-CM | POA: Diagnosis not present

## 2021-12-22 DIAGNOSIS — Z01818 Encounter for other preprocedural examination: Secondary | ICD-10-CM | POA: Diagnosis not present

## 2021-12-22 DIAGNOSIS — M6281 Muscle weakness (generalized): Secondary | ICD-10-CM

## 2021-12-22 DIAGNOSIS — M5441 Lumbago with sciatica, right side: Secondary | ICD-10-CM | POA: Diagnosis not present

## 2021-12-22 DIAGNOSIS — M5442 Lumbago with sciatica, left side: Secondary | ICD-10-CM | POA: Diagnosis not present

## 2021-12-22 DIAGNOSIS — I471 Supraventricular tachycardia: Secondary | ICD-10-CM | POA: Insufficient documentation

## 2021-12-22 DIAGNOSIS — R2689 Other abnormalities of gait and mobility: Secondary | ICD-10-CM | POA: Diagnosis not present

## 2021-12-22 DIAGNOSIS — R2681 Unsteadiness on feet: Secondary | ICD-10-CM | POA: Diagnosis not present

## 2021-12-22 HISTORY — DX: Unspecified osteoarthritis, unspecified site: M19.90

## 2021-12-22 LAB — BASIC METABOLIC PANEL
Anion gap: 9 (ref 5–15)
BUN: 15 mg/dL (ref 8–23)
CO2: 26 mmol/L (ref 22–32)
Calcium: 10.5 mg/dL — ABNORMAL HIGH (ref 8.9–10.3)
Chloride: 104 mmol/L (ref 98–111)
Creatinine, Ser: 0.6 mg/dL (ref 0.44–1.00)
GFR, Estimated: >60 mL/min (ref >60)
Glucose, Bld: 86 mg/dL (ref 70–99)
Potassium: 3.4 mmol/L — ABNORMAL LOW (ref 3.5–5.1)
Sodium: 139 mmol/L (ref 135–145)

## 2021-12-22 LAB — HEMOGLOBIN A1C
Hgb A1c MFr Bld: 9.2 % — ABNORMAL HIGH (ref 4.8–5.6)
Mean Plasma Glucose: 217.34 mg/dL

## 2021-12-22 LAB — TYPE AND SCREEN
ABO/RH(D): O POS
Antibody Screen: NEGATIVE

## 2021-12-22 LAB — CBC
HCT: 38.2 % (ref 36.0–46.0)
Hemoglobin: 12.4 g/dL (ref 12.0–15.0)
MCH: 30.4 pg (ref 26.0–34.0)
MCHC: 32.5 g/dL (ref 30.0–36.0)
MCV: 93.6 fL (ref 80.0–100.0)
Platelets: 255 10*3/uL (ref 150–400)
RBC: 4.08 MIL/uL (ref 3.87–5.11)
RDW: 15.7 % — ABNORMAL HIGH (ref 11.5–15.5)
WBC: 6.8 10*3/uL (ref 4.0–10.5)
nRBC: 0 % (ref 0.0–0.2)

## 2021-12-22 LAB — SURGICAL PCR SCREEN
MRSA, PCR: NEGATIVE
Staphylococcus aureus: NEGATIVE

## 2021-12-22 LAB — GLUCOSE, CAPILLARY: Glucose-Capillary: 101 mg/dL — ABNORMAL HIGH (ref 70–99)

## 2021-12-22 LAB — PROTIME-INR
INR: 0.9 (ref 0.8–1.2)
Prothrombin Time: 12.5 seconds (ref 11.4–15.2)

## 2021-12-22 NOTE — Therapy (Signed)
OUTPATIENT PHYSICAL THERAPY TREATMENT NOTE/DISCHARGE  PHYSICAL THERAPY DISCHARGE SUMMARY  Visits from Start of Care: 5  Current functional level related to goals / functional outcomes: See goals and objective   Remaining deficits: See goals and objective   Education / Equipment: HEP   Patient agrees to discharge. Patient goals were partially met. Patient is being discharged due to  patient having cervical fusion surgery on 12/26/2021.   Patient Name: Alicia [name] MRN: [id number] DOB:03-Feb-1956, 66 y.o., female Today's Date: 12/22/2021  PCP: Eulis Foster, MD REFERRING PROVIDER: Trula Slade, DPM   PT End of Session - 12/22/21 1127     Visit Number 5    Date for PT Re-Evaluation 01/26/22   1-2x/week   Authorization Type MCR - FOTO    Progress Note Due on Visit 10    PT Start Time 1130    PT Stop Time 1208    PT Time Calculation (min) 38 min    Activity Tolerance Patient tolerated treatment well    Behavior During Therapy [facility] for tasks assessed/performed             Past Medical History:  Diagnosis Date   Anemia    Arthritis    back and knees   ARTHRITIS, KNEE 09/17/2007   Asthma 04/04/2010   pt states she does not have asthma   Cataract 01/07/2019   Closed fracture of distal end of left radius 11/01/2015   Complete deafness    Meningitis at age 66   Deaf    Diabetes mellitus    Diabetic neuropathy ([place])    Ganglion cyst 06/22/2011   Gastroparesis    GERD (gastroesophageal reflux disease)    H/O: C-section    Hyperlipidemia    Hypertension    Neuromuscular disorder ([place])    diabetic neuropathy   PSVT (paroxysmal supraventricular tachycardia) ([place]) 11/09/2017   Event monitor 09/14/2017 - Predominantly sinus rhythm with episodes of narrow-complex tachycardia suggestive of paroxysmal supraventricular tachycardia.   S/P appy    Past Surgical History:  Procedure Laterality Date   APPENDECTOMY     cardiolyte EF 77%, no ischemia in 2006   2006   [place] Right 04/20/2015   Procedure: RIGHT CARPAL TUNNEL RELEASE;  Surgeon: Daryll Brod, MD;  Location: [place];  Service: Orthopedics;  Laterality: Right;   CARPAL TUNNEL RELEASE Left 11/04/2015   Procedure: CARPAL TUNNEL RELEASE;  Surgeon: Daryll Brod, MD;  Location: [place];  Service: Orthopedics;  Laterality: Left;   CESAREAN SECTION     OPEN REDUCTION INTERNAL FIXATION (ORIF) DISTAL RADIAL FRACTURE Left 11/04/2015   Procedure: OPEN REDUCTION INTERNAL FIXATION (ORIF) LEFT DISTAL RADIAL FRACTURE POSSIBLE BONE GRAFT;  Surgeon: Daryll Brod, MD;  Location: [place];  Service: Orthopedics;  Laterality: Left;   TRIGGER FINGER RELEASE Right 04/20/2015   Procedure: RELEASE TRIGGER FINGER/A-1 PULLEY RIGHT MIDDLE FINGER,RIGHT RING FINGER;  Surgeon: Daryll Brod, MD;  Location: [place];  Service: Orthopedics;  Laterality: Right;   TUBAL LIGATION     ULNAR NERVE TRANSPOSITION Right 04/20/2015   Procedure: RIGHT ULNAR NERVE DECOMPRESSION;  Surgeon: Daryll Brod, MD;  Location: [place];  Service: Orthopedics;  Laterality: Right;   UTERINE FIBROID EMBOLIZATION  2007   WRIST SURGERY     Cyst removed on left   WRIST SURGERY Right 1985   tendon repair R wrist   Patient Active Problem List   Diagnosis Date Noted   Cervical disc disorder  with radiculopathy of cervical region 10/28/2021   Sternocleidomastoid muscle tenderness 09/26/2021   Musculoskeletal disorder involving upper trapezius muscle 08/05/2021   Back pain 06/20/2021   Urinary hesitancy 05/13/2021   Hemorrhoids 02/03/2021   Irritable bowel syndrome 11/22/2020   Acute viral sinusitis 11/12/2020   Bilateral sciatica 10/27/2020   Greater trochanteric bursitis of both hips 10/27/2020   Chronic idiopathic constipation 06/25/2020   Elevated blood sugar 06/10/2020   Hernia of abdominal wall 01/28/2020   Spinal stenosis at L4-L5 level 01/12/2020    Transient neurologic deficit 12/22/2019   Frequent loose stools 04/20/2019   Hyperthyroidism 03/07/2019   Hypertension 01/07/2019   Hypercalcemia 10/08/2018   Seasonal allergies 08/27/2018   Osteoarthritis involving multiple joints on both sides of body 05/25/2018   Statin intolerance 12/26/2017   PSVT (paroxysmal supraventricular tachycardia) ([place]) 11/09/2017   Chest pain 08/30/2017   Hyperlipidemia 08/30/2017   OSA (obstructive sleep apnea) 08/31/2016   Healthcare maintenance 03/07/2016   Diabetes ([place]) 09/14/2015   Muscle pain 09/28/2014   Seizure ([place]) 06/08/2014   Iron deficiency anemia 07/01/2012   Diabetic retinopathy ([place]) 07/21/2011   GERD 12/30/2007   Diabetic polyneuropathy ([place]) 08/30/2006   Type 2 diabetes mellitus with ophthalmic complication ([place]) 13/01/6577   HYPERCHOLESTEROLEMIA 08/30/2006   Hearing loss 08/30/2006    THERAPY DIAG:  Muscle weakness  Chronic bilateral low back pain with bilateral sciatica  REFERRING DIAG: Referral diagnosis: Gait instability [R26.81]  PERTINENT HISTORY: Diabetic neuropathy, back problems with sciatica (pain, no numbness), R knee pain, L hip pain, spinal stenosis, osteoporosis   PRECAUTIONS/RESTRICTIONS:   FALL!!!  SUBJECTIVE:   In person interpreter present throughout  Pt reports that her neck is hurting more today.  Her calves are hurting  Pain:  Are you having pain? Yes Pain location: low back pain NPRS scale:  current 5/10  Aggravating factors: activity           NPRS, highest: 4/10 Relieving factors: sitting/resting           NPRS: best: 3/10 Pain description: constant, sharp, and aching  OBJECTIVE:  X-Ray             X-ray images left hip and right knee obtained today personally and independently interpreted   Left hip: No acute fractures are present.  No significant left hip degenerative changes are present. Rounded calcified disc disease present in the pelvis likely represent uterine fibroids.    Right knee: Superior patellar pole osteophyte.  Minimal degenerative changes.  No acute fractures are present.   Await formal radiology review             GENERAL OBSERVATION/GAIT:                     Antalgic, slow gait using SPC   SENSATION:          Light touch: Appears intact   LE MMT:   MMT Right 12/01/2021 Left 12/01/2021  Hip flexion (L2, L3) 3 3  Knee extension (L3) 3+ 3+  Knee flexion 3+ 3+  Hip abduction 3- 3-  Hip extension 3+ 3  Hip external rotation      Hip internal rotation      Hip adduction      Ankle dorsiflexion (L4)      Ankle plantarflexion (S1) 3x 3x  Ankle inversion      Ankle eversion      Great Toe ext (L5)      Grossly        (  Blank rows = not tested, score listed is out of 5 possible points.  N = WNL, D = diminished, C = clear for gross weakness with myotome testing, * = concordant pain with testing)   LE ROM:   ROM Right 12/01/2021 Left 12/01/2021  Hip flexion      Hip extension      Hip abduction      Hip adduction      Hip internal rotation      Hip external rotation      Knee flexion      Knee extension      Ankle dorsiflexion      Ankle plantarflexion      Ankle inversion      Ankle eversion        (Blank rows = not tested, N = WNL, * = concordant pain with testing)   Functional Tests   Eval (12/01/2021) 12/22/2021    BERG BALANCE TEST Sitting to Standing: 3.      Stands independently using hands Standing Unsupported: 3.      Stands 2 minutes with supervision Sitting Unsupported: 4.     Sits for 2 minutes independently Standing to Sitting: 1.     Sits independently, but uncontrolled descent  Transfers: 3.     Transfers safely definite use of hands Standing with eyes closed: 3.     Stands 10 seconds with supervision Standing with feet together: 0.     Needs help to attain position and unable to hold for 15 seconds Reaching forward with outstretched arm: 2.     Reaches forward 2 inches Retrieving object from the floor: 3.     Able  to pick up with supervision Turning to look behind: 4.     Looks behind from both sides and weight shifts well Turning 360 degrees: 1.     Needs supervision or verbal cueing Place alternate foot on stool: 2.     4 steps without assistance/supervision Standing with one foot in front: 0.     Loses balance while standing/stepping Standing on one foot: 0.     Unable   Total Score: 29/56    BERG BALANCE TEST Sitting to Standing: 3.      Stands independently using hands Standing Unsupported: 3.      Stands 2 minutes with supervision Sitting Unsupported: 4.     Sits for 2 minutes independently Standing to Sitting: 1.     Sits independently, but uncontrolled descent  Transfers: 3.     Transfers safely definite use of hands Standing with eyes closed: 3.     Stands 10 seconds with supervision Standing with feet together: 0.     Needs help to attain position and unable to hold for 15 seconds Reaching forward with outstretched arm: 2.     Reaches forward 2 inches Retrieving object from the floor: 3.     Able to pick up with supervision Turning to look behind: 4.     Looks behind from both sides and weight shifts well Turning 360 degrees: 1.     Needs supervision or verbal cueing Place alternate foot on stool: 2.     4 steps without assistance/supervision Standing with one foot in front: 0.     Loses balance while standing/stepping Standing on one foot: 0.     Unable   Total Score: 29/56  30'' STS: 1x  UE used? Y   30'' STS: 4x  UE used? Darreld Mclean  PATIENT EDUCATION:  HEP and discharge   HOME EXERCISE PROGRAM: Access Code: [email or web address] URL: [email or web address] Date: 12/01/2021 Prepared by: Shearon Balo   Exercises - Hooklying Clamshell with Resistance  - 1 x daily - 7 x weekly - 3 sets - 10 reps - Supine Bridge  - 1 x daily - 7 x weekly - 3 sets - 10 reps - Supine Active Straight Leg Raise  - 1 x daily - 7 x weekly - 3 sets - 10 reps   TREATMENT 6/20:  Therapeutic  Exercise: Supine clamshell x 15 GTB Bridge x 10 - increased neck pain Supine SLR x 5 Therapeutic Activity: Assessment of tests/measures, goals, and outcomes for discharge   TREATMENT 6/15:  Therapeutic Exercise: - nu-step L5 20m while taking subjective and planning session with patient - knee ext machine 5# - 3x10 - HS curl - 10# - 3x10 - alternating SLR on foam roller - alternating - x20 - Sit to stand from blue chair - 3x10  Neuromuscular re-ed (gait belt): - semi tandem (100%) - feet together on foam - marching on foam - rocker board DF/PF (NT)    TREATMENT 6/13:  Therapeutic Exercise: - nu-step L5 90m while taking subjective and planning session with patient - LAQ - 5# - 2x20 - knee ext machine (unable d/t weakness) - alternating SLR on foam roller - alternating - x20 - Sit to stand from raised mat table - 3x10 - bird dog - unable d/t weakness  Neuromuscular re-ed (gait belt): - semi tandem (100%) - feet together on foam - rocker board DF/PF   ASSESSMENT:   CLINICAL IMPRESSION: Pt was able to complete prescribed exercises and demonstrated knowledge of HEP, despite increased pain with bridge. Over the course of PT she was not able to progress her balance due to cervical myelopathy with no change in BERG. She did improve her functional ability with transfers, as noted by 30 Second Sit to Stand improvement. Pt will most likely return once cleared after surgery. Pt in agreement with current plan and is ready to discharge at this time.    OBJECTIVE IMPAIRMENTS: Pain, gross hip/LE/core weakness, balance, gait   ACTIVITY LIMITATIONS: bending, walking, squatting, stairs, housework   PERSONAL FACTORS: See medical history and pertinent history    GOALS:     SHORT TERM GOALS: Target date: 12/22/2021   Jailani will be >75% HEP compliant to improve carryover between sessions and facilitate independent management of condition   Evaluation (12/01/2021): ongoing Goal status:  Met 6/22     LONG TERM GOALS: Target date: 01/26/2022   Meaghann will improve FOTO score to predicted as a proxy for functional improvement   Goal status: NOT TAKEN     2.  Kelda will imporve BERG balance score to 45 pts, to show a significant improvement in balance in order to reduce fall risk   Evaluation/Baseline (12/01/2021): 29 pts 12/22/2021: 29 pts Goal status: INITIAL   See interpretation below:         3.  Lyra will improve the following MMTs to >/= 4/5 to show improvement in strength:  bil LE   Evaluation/Baseline (12/01/2021): see chart in note Goal status: MET     4.  Ynez will report confidence in self management of condition at time of discharge with advanced HEP   Evaluation/Baseline (12/01/2021): unable to self manage Goal status: NOT MET   5.  August will improve 30'' STS (MCID 2) to >/= 5x (w/ UE?: Y) to show improved LE strength  and improved transfers    Evaluation/Baseline (12/01/2021): 1x  w/ UE? Y 12/22/2021: MOSTLY MET Goal status: INITIAL      PLAN: PT FREQUENCY: 1-2x/week   PT DURATION: 8 weeks (Ending 01/26/2022)   PLANNED INTERVENTIONS: Therapeutic exercises, Aquatic therapy, Therapeutic activity, Neuro Muscular re-education, Gait training, Patient/Family education, Joint mobilization, Dry Needling, Electrical stimulation, Spinal mobilization and/or manipulation, Moist heat, Taping, Vasopneumatic device, Ionotophoresis 4mg /ml Dexamethasone, and Manual therapy   PLAN FOR NEXT SESSION: FOTO, progressive balance, progressive LE/hip/core strength   Ward Chatters PT 12/22/2021, 1:45 PM

## 2021-12-22 NOTE — Progress Notes (Signed)
PCP - Dr. Eulis Foster Cardiologist - Dr. Saunders Revel  PPM/ICD - denies   Chest x-ray - 05/09/2019 EKG - 12/22/21 Stress Test - 09/14/17 ECHO - 10/14/02 Cardiac Cath - denies  Sleep Study - 5+ years ago, OSA+ CPAP - denies, unable to tolerate  DM- Type 2 Fasting Blood Sugar - 80-100 Checks Blood Sugar 1-4 times a day  Blood Thinner Instructions: n/a Aspirin Instructions: hold 7 days prior to surgery  ERAS Protcol - no, NPO   COVID TEST- n/a   Anesthesia review: yes, cardiac hx  Patient denies shortness of breath, fever, cough and chest pain at PAT appointment   All instructions explained to the patient, with a verbal understanding of the material. Patient agrees to go over the instructions while at home for a better understanding.  The opportunity to ask questions was provided.

## 2021-12-23 NOTE — Anesthesia Preprocedure Evaluation (Addendum)
Anesthesia Evaluation  Patient identified by MRN, date of birth, ID band Patient awake    Reviewed: Allergy & Precautions, NPO status , Patient's Chart, lab work & pertinent test results  Airway Mallampati: II  TM Distance: >3 FB Neck ROM: Limited    Dental no notable dental hx.    Pulmonary asthma , sleep apnea ,    Pulmonary exam normal        Cardiovascular hypertension, Pt. on medications and Pt. on home beta blockers  Rhythm:Regular Rate:Normal     Neuro/Psych Seizures -, Well Controlled,  negative psych ROS   GI/Hepatic Neg liver ROS, GERD  Medicated,  Endo/Other  diabetes, Poorly Controlled, Type 2, Insulin Dependent, Oral Hypoglycemic AgentsHyperthyroidism   Renal/GU negative Renal ROS  negative genitourinary   Musculoskeletal  (+) Arthritis , Osteoarthritis,    Abdominal Normal abdominal exam  (+)   Peds  Hematology  (+) Blood dyscrasia, anemia , Lab Results      Component                Value               Date                      WBC                      6.8                 12/22/2021                HGB                      12.4                12/22/2021                HCT                      38.2                12/22/2021                MCV                      93.6                12/22/2021                PLT                      255                 12/22/2021           Lab Results      Component                Value               Date                      NA                       139                 12/22/2021  K                        3.4 (L)             12/22/2021                CO2                      26                  12/22/2021                GLUCOSE                  86                  12/22/2021                BUN                      15                  12/22/2021                CREATININE               0.60                12/22/2021                CALCIUM                   10.5 (H)            12/22/2021                EGFR                     94                  05/13/2021                GFRNONAA                 >60                 12/22/2021             Anesthesia Other Findings   Reproductive/Obstetrics                           Anesthesia Physical Anesthesia Plan  ASA: 3  Anesthesia Plan: General   Post-op Pain Management: Celebrex PO (pre-op)*, Tylenol PO (pre-op)*, Ketamine IV* and Gabapentin PO (pre-op)*   Induction: Intravenous  PONV Risk Score and Plan: 3 and Ondansetron, Dexamethasone, Midazolam and Treatment may vary due to age or medical condition  Airway Management Planned: Mask, Oral ETT and Video Laryngoscope Planned  Additional Equipment: None  Intra-op Plan:   Post-operative Plan: Extubation in OR  Informed Consent: I have reviewed the patients History and Physical, chart, labs and discussed the procedure including the risks, benefits and alternatives for the proposed anesthesia with the patient or authorized representative who has indicated his/her understanding and acceptance.     Dental advisory given and Interpreter used for interveiw  Plan Discussed with: CRNA  Anesthesia Plan Comments: (PAT note by Antionette Poles, PA-C: Follows annually with cardiology for history of HTN,  HLD, SVT, atypical chest pain with history of negative stress testing.  Last seen May 2022, doing well at time cardiac standpoint, no chest pain, infrequent palpitations, rarely using as needed diltiazem.  No changes made to management.  Recommended continue annual follow-ups.  IDDM 2, poorly controlled, A1c 9.2 on preop labs.  Remainder of preop labs reviewed, unremarkable.  Patient is deaf, requires sign language interpreter.  EKG 12/22/2021: NSR.  Rate 89.  Nuclear stress 09/14/2017: 1. EF 61%, normal wall motion.  2. No evidence for ischemia or infarction by perfusion images.   Normal study.   Event monitor  09/14/2017: . The patient was monitored for 14 days. . The predominant rhythm was sinus; average heart rate during triggered events was 94 bpm. . Two episodes of narrow complex tachycardia were observed, suggestive of SVT. Marland Kitchen No ventricular arrhythmias or prolonged pauses were seen. . Patient triggered events corresponding to rapid heart rate/palpitations/flutters correspond to sinus rhythm and narrow-complex tachycardia.  Predominantly sinus rhythm with episodes of narrow-complex tachycardia suggestive of paroxysmal supraventricular tachycardia.  )      Anesthesia Quick Evaluation

## 2021-12-26 ENCOUNTER — Encounter (HOSPITAL_COMMUNITY)
Admission: RE | Disposition: A | Discharge status: Home / Self Care | Payer: Self-pay | Source: Home / Self Care | Attending: Neurological Surgery

## 2021-12-26 ENCOUNTER — Encounter (HOSPITAL_COMMUNITY): Payer: Self-pay | Admitting: Neurological Surgery

## 2021-12-26 ENCOUNTER — Inpatient Hospital Stay (HOSPITAL_COMMUNITY)
Admission: RE | Admit: 2021-12-26 | Discharge: 2021-12-27 | DRG: 473 | Disposition: A | Discharge status: Home / Self Care | Payer: Medicare Other | Attending: Neurological Surgery | Admitting: Neurological Surgery

## 2021-12-26 ENCOUNTER — Other Ambulatory Visit: Payer: Self-pay

## 2021-12-26 ENCOUNTER — Inpatient Hospital Stay (HOSPITAL_COMMUNITY): Payer: Medicare Other

## 2021-12-26 ENCOUNTER — Inpatient Hospital Stay (HOSPITAL_COMMUNITY): Payer: Medicare Other | Admitting: Physician Assistant

## 2021-12-26 DIAGNOSIS — Z79899 Other long term (current) drug therapy: Secondary | ICD-10-CM | POA: Diagnosis not present

## 2021-12-26 DIAGNOSIS — M50122 Cervical disc disorder at C5-C6 level with radiculopathy: Secondary | ICD-10-CM | POA: Diagnosis present

## 2021-12-26 DIAGNOSIS — M4802 Spinal stenosis, cervical region: Secondary | ICD-10-CM | POA: Diagnosis not present

## 2021-12-26 DIAGNOSIS — Z882 Allergy status to sulfonamides status: Secondary | ICD-10-CM

## 2021-12-26 DIAGNOSIS — E1143 Type 2 diabetes mellitus with diabetic autonomic (poly)neuropathy: Secondary | ICD-10-CM | POA: Diagnosis present

## 2021-12-26 DIAGNOSIS — Z833 Family history of diabetes mellitus: Secondary | ICD-10-CM

## 2021-12-26 DIAGNOSIS — M4722 Other spondylosis with radiculopathy, cervical region: Secondary | ICD-10-CM | POA: Diagnosis present

## 2021-12-26 DIAGNOSIS — Z791 Long term (current) use of non-steroidal anti-inflammatories (NSAID): Secondary | ICD-10-CM

## 2021-12-26 DIAGNOSIS — J45909 Unspecified asthma, uncomplicated: Secondary | ICD-10-CM | POA: Diagnosis present

## 2021-12-26 DIAGNOSIS — Z794 Long term (current) use of insulin: Secondary | ICD-10-CM

## 2021-12-26 DIAGNOSIS — Z8249 Family history of ischemic heart disease and other diseases of the circulatory system: Secondary | ICD-10-CM | POA: Diagnosis not present

## 2021-12-26 DIAGNOSIS — G4730 Sleep apnea, unspecified: Secondary | ICD-10-CM | POA: Diagnosis not present

## 2021-12-26 DIAGNOSIS — Z888 Allergy status to other drugs, medicaments and biological substances status: Secondary | ICD-10-CM

## 2021-12-26 DIAGNOSIS — K219 Gastro-esophageal reflux disease without esophagitis: Secondary | ICD-10-CM | POA: Diagnosis present

## 2021-12-26 DIAGNOSIS — E119 Type 2 diabetes mellitus without complications: Secondary | ICD-10-CM

## 2021-12-26 DIAGNOSIS — I10 Essential (primary) hypertension: Secondary | ICD-10-CM | POA: Diagnosis present

## 2021-12-26 DIAGNOSIS — Z7982 Long term (current) use of aspirin: Secondary | ICD-10-CM | POA: Diagnosis not present

## 2021-12-26 DIAGNOSIS — M50322 Other cervical disc degeneration at C5-C6 level: Secondary | ICD-10-CM | POA: Diagnosis not present

## 2021-12-26 DIAGNOSIS — H918X3 Other specified hearing loss, bilateral: Secondary | ICD-10-CM | POA: Diagnosis present

## 2021-12-26 DIAGNOSIS — M5412 Radiculopathy, cervical region: Secondary | ICD-10-CM

## 2021-12-26 DIAGNOSIS — M170 Bilateral primary osteoarthritis of knee: Secondary | ICD-10-CM | POA: Diagnosis present

## 2021-12-26 DIAGNOSIS — E1165 Type 2 diabetes mellitus with hyperglycemia: Secondary | ICD-10-CM

## 2021-12-26 DIAGNOSIS — H269 Unspecified cataract: Secondary | ICD-10-CM | POA: Diagnosis present

## 2021-12-26 DIAGNOSIS — M542 Cervicalgia: Secondary | ICD-10-CM | POA: Diagnosis not present

## 2021-12-26 DIAGNOSIS — Z981 Arthrodesis status: Principal | ICD-10-CM

## 2021-12-26 DIAGNOSIS — Z8661 Personal history of infections of the central nervous system: Secondary | ICD-10-CM | POA: Diagnosis not present

## 2021-12-26 DIAGNOSIS — K3184 Gastroparesis: Secondary | ICD-10-CM | POA: Diagnosis present

## 2021-12-26 DIAGNOSIS — M50121 Cervical disc disorder at C4-C5 level with radiculopathy: Secondary | ICD-10-CM | POA: Diagnosis present

## 2021-12-26 DIAGNOSIS — M5011 Cervical disc disorder with radiculopathy,  high cervical region: Secondary | ICD-10-CM | POA: Diagnosis present

## 2021-12-26 DIAGNOSIS — Z7984 Long term (current) use of oral hypoglycemic drugs: Secondary | ICD-10-CM | POA: Diagnosis not present

## 2021-12-26 DIAGNOSIS — E785 Hyperlipidemia, unspecified: Secondary | ICD-10-CM | POA: Diagnosis present

## 2021-12-26 HISTORY — PX: POSTERIOR CERVICAL FUSION/FORAMINOTOMY: SHX5038

## 2021-12-26 LAB — GLUCOSE, CAPILLARY
Glucose-Capillary: 67 mg/dL — ABNORMAL LOW (ref 70–99)
Glucose-Capillary: 136 mg/dL — ABNORMAL HIGH (ref 70–99)
Glucose-Capillary: 179 mg/dL — ABNORMAL HIGH (ref 70–99)
Glucose-Capillary: 229 mg/dL — ABNORMAL HIGH (ref 70–99)
Glucose-Capillary: 288 mg/dL — ABNORMAL HIGH (ref 70–99)
Glucose-Capillary: 186 mg/dL — ABNORMAL HIGH (ref 70–99)

## 2021-12-26 LAB — ABO/RH: ABO/RH(D): O POS

## 2021-12-26 SURGERY — POSTERIOR CERVICAL FUSION/FORAMINOTOMY LEVEL 3
Anesthesia: General

## 2021-12-26 MED ORDER — ONDANSETRON HCL 4 MG/2ML IJ SOLN
INTRAMUSCULAR | Status: AC
Start: 2021-12-26 — End: ?
  Filled 2021-12-26: qty 2

## 2021-12-26 MED ORDER — CEFAZOLIN SODIUM-DEXTROSE 2-4 GM/100ML-% IV SOLN
2.0000 g | INTRAVENOUS | Status: AC
  Administered 2021-12-26: 200 g via INTRAVENOUS
  Administered 2021-12-26: 2 g via INTRAVENOUS
  Filled 2021-12-26: qty 100

## 2021-12-26 MED ORDER — ORAL CARE MOUTH RINSE: 15.0000 mL | Freq: Once | OROMUCOSAL | Status: AC

## 2021-12-26 MED ORDER — PHENOL 1.4 % MT LIQD: 1.0000 | OROMUCOSAL | Status: DC | PRN

## 2021-12-26 MED ORDER — THROMBIN 5000 UNITS EX SOLR
CUTANEOUS | Status: AC
Start: 2021-12-26 — End: ?
  Filled 2021-12-26: qty 5000

## 2021-12-26 MED ORDER — SODIUM CHLORIDE 0.9% FLUSH: 3.0000 mL | INTRAVENOUS | Status: DC | PRN

## 2021-12-26 MED ORDER — BUPIVACAINE HCL (PF) 0.25 % IJ SOLN
INTRAMUSCULAR | Status: AC
  Filled 2021-12-26: qty 30

## 2021-12-26 MED ORDER — ROCURONIUM BROMIDE 10 MG/ML (PF) SYRINGE
PREFILLED_SYRINGE | INTRAVENOUS | Status: AC
  Filled 2021-12-26: qty 10

## 2021-12-26 MED ORDER — DEXAMETHASONE SODIUM PHOSPHATE 10 MG/ML IJ SOLN
INTRAMUSCULAR | Status: AC
  Filled 2021-12-26: qty 1

## 2021-12-26 MED ORDER — MENTHOL 3 MG MT LOZG: 1.0000 | LOZENGE | OROMUCOSAL | Status: DC | PRN

## 2021-12-26 MED ORDER — VITAMIN D 25 MCG (1000 UNIT) PO TABS
1000.0000 [IU] | ORAL_TABLET | Freq: Every day | ORAL | Status: DC
  Administered 2021-12-26 – 2021-12-27 (×2): 1000 [IU] via ORAL
  Filled 2021-12-26: qty 1

## 2021-12-26 MED ORDER — LIDOCAINE 2% (20 MG/ML) 5 ML SYRINGE
INTRAMUSCULAR | Status: AC
  Filled 2021-12-26: qty 5

## 2021-12-26 MED ORDER — PROPOFOL 10 MG/ML IV BOLUS
INTRAVENOUS | Status: DC | PRN
  Administered 2021-12-26: 50 mg via INTRAVENOUS
  Administered 2021-12-26: 80 mg via INTRAVENOUS

## 2021-12-26 MED ORDER — CARVEDILOL 25 MG PO TABS
25.0000 mg | ORAL_TABLET | Freq: Two times a day (BID) | ORAL | Status: DC
  Administered 2021-12-26 – 2021-12-27 (×2): 25 mg via ORAL
  Filled 2021-12-26 (×2): qty 1

## 2021-12-26 MED ORDER — ONDANSETRON HCL 4 MG PO TABS: 4.0000 mg | ORAL_TABLET | Freq: Four times a day (QID) | ORAL | Status: DC | PRN

## 2021-12-26 MED ORDER — TRAMADOL HCL 50 MG PO TABS
50.0000 mg | ORAL_TABLET | Freq: Four times a day (QID) | ORAL | Status: DC | PRN
  Administered 2021-12-26 – 2021-12-27 (×3): 50 mg via ORAL
  Filled 2021-12-26 (×3): qty 1

## 2021-12-26 MED ORDER — MORPHINE SULFATE (PF) 2 MG/ML IV SOLN: 2.0000 mg | INTRAVENOUS | Status: DC | PRN

## 2021-12-26 MED ORDER — DILTIAZEM HCL ER COATED BEADS 120 MG PO CP24
240.0000 mg | ORAL_CAPSULE | Freq: Every day | ORAL | Status: DC
Start: 2021-12-26 — End: 2021-12-27
  Administered 2021-12-27: 240 mg via ORAL
  Filled 2021-12-26 (×2): qty 2

## 2021-12-26 MED ORDER — GABAPENTIN 600 MG PO TABS
600.0000 mg | ORAL_TABLET | Freq: Two times a day (BID) | ORAL | Status: DC
  Administered 2021-12-26: 600 mg via ORAL
  Filled 2021-12-26 (×3): qty 1

## 2021-12-26 MED ORDER — LINACLOTIDE 72 MCG PO CAPS
72.0000 ug | ORAL_CAPSULE | Freq: Every morning | ORAL | Status: DC
  Filled 2021-12-26: qty 1

## 2021-12-26 MED ORDER — DULOXETINE HCL 30 MG PO CPEP
30.0000 mg | ORAL_CAPSULE | Freq: Every day | ORAL | Status: DC
  Administered 2021-12-27: 30 mg via ORAL
  Filled 2021-12-26: qty 1

## 2021-12-26 MED ORDER — LIDOCAINE 2% (20 MG/ML) 5 ML SYRINGE
INTRAMUSCULAR | Status: DC | PRN
  Administered 2021-12-26: 40 mg via INTRAVENOUS

## 2021-12-26 MED ORDER — BUPIVACAINE HCL (PF) 0.25 % IJ SOLN
INTRAMUSCULAR | Status: DC | PRN
  Administered 2021-12-26: 10 mL

## 2021-12-26 MED ORDER — PANTOPRAZOLE SODIUM 40 MG PO TBEC
40.0000 mg | DELAYED_RELEASE_TABLET | Freq: Every morning | ORAL | Status: DC
  Administered 2021-12-27: 40 mg via ORAL
  Filled 2021-12-26: qty 1

## 2021-12-26 MED ORDER — THROMBIN 20000 UNITS EX SOLR
CUTANEOUS | Status: DC | PRN
  Administered 2021-12-26: 20 mL via TOPICAL

## 2021-12-26 MED ORDER — SODIUM CHLORIDE 0.9 % IV SOLN
250.0000 mL | INTRAVENOUS | Status: DC
  Administered 2021-12-26: 250 mL via INTRAVENOUS

## 2021-12-26 MED ORDER — IRBESARTAN 75 MG PO TABS
37.5000 mg | ORAL_TABLET | Freq: Every day | ORAL | Status: DC
  Administered 2021-12-26: 37.5 mg via ORAL
  Filled 2021-12-26 (×2): qty 0.5

## 2021-12-26 MED ORDER — FENTANYL CITRATE (PF) 100 MCG/2ML IJ SOLN
INTRAMUSCULAR | Status: AC
  Filled 2021-12-26: qty 2

## 2021-12-26 MED ORDER — FERROUS SULFATE 325 (65 FE) MG PO TABS
325.0000 mg | ORAL_TABLET | Freq: Every day | ORAL | Status: DC
Start: 2021-12-27 — End: 2021-12-27
  Administered 2021-12-27: 325 mg via ORAL
  Filled 2021-12-26: qty 1

## 2021-12-26 MED ORDER — MIDAZOLAM HCL 2 MG/2ML IJ SOLN
INTRAMUSCULAR | Status: DC | PRN
  Administered 2021-12-26: 2 mg via INTRAVENOUS

## 2021-12-26 MED ORDER — INSULIN ASPART 100 UNIT/ML IJ SOLN
0.0000 [IU] | Freq: Three times a day (TID) | INTRAMUSCULAR | Status: DC
  Administered 2021-12-26: 5 [IU] via SUBCUTANEOUS
  Administered 2021-12-26: 8 [IU] via SUBCUTANEOUS
  Administered 2021-12-27: 5 [IU] via SUBCUTANEOUS

## 2021-12-26 MED ORDER — PHENYLEPHRINE 80 MCG/ML (10ML) SYRINGE FOR IV PUSH (FOR BLOOD PRESSURE SUPPORT)
PREFILLED_SYRINGE | INTRAVENOUS | Status: DC | PRN
  Administered 2021-12-26 (×2): 80 ug via INTRAVENOUS

## 2021-12-26 MED ORDER — PROPOFOL 10 MG/ML IV BOLUS
INTRAVENOUS | Status: AC
  Filled 2021-12-26: qty 20

## 2021-12-26 MED ORDER — OXYCODONE HCL 5 MG PO TABS
10.0000 mg | ORAL_TABLET | ORAL | Status: DC | PRN
  Administered 2021-12-26: 10 mg via ORAL
  Filled 2021-12-26: qty 2

## 2021-12-26 MED ORDER — MIDAZOLAM HCL 2 MG/2ML IJ SOLN
INTRAMUSCULAR | Status: AC
Start: 2021-12-26 — End: ?
  Filled 2021-12-26: qty 2

## 2021-12-26 MED ORDER — PHENYLEPHRINE 80 MCG/ML (10ML) SYRINGE FOR IV PUSH (FOR BLOOD PRESSURE SUPPORT)
PREFILLED_SYRINGE | INTRAVENOUS | Status: AC
Start: 2021-12-26 — End: ?
  Filled 2021-12-26: qty 10

## 2021-12-26 MED ORDER — ACETAMINOPHEN 500 MG PO TABS
1000.0000 mg | ORAL_TABLET | ORAL | Status: AC
  Administered 2021-12-26: 1000 mg via ORAL
  Filled 2021-12-26: qty 2

## 2021-12-26 MED ORDER — POTASSIUM CHLORIDE IN NACL 20-0.9 MEQ/L-% IV SOLN: INTRAVENOUS | Status: DC

## 2021-12-26 MED ORDER — CHLORHEXIDINE GLUCONATE CLOTH 2 % EX PADS: 6.0000 | MEDICATED_PAD | Freq: Once | CUTANEOUS | Status: DC

## 2021-12-26 MED ORDER — HYDROXYZINE HCL 50 MG/ML IM SOLN
50.0000 mg | Freq: Four times a day (QID) | INTRAMUSCULAR | Status: DC | PRN
  Administered 2021-12-26: 50 mg via INTRAMUSCULAR
  Filled 2021-12-26: qty 1

## 2021-12-26 MED ORDER — FENTANYL CITRATE (PF) 250 MCG/5ML IJ SOLN
INTRAMUSCULAR | Status: AC
  Filled 2021-12-26: qty 5

## 2021-12-26 MED ORDER — SENNA 8.6 MG PO TABS: 1.0000 | ORAL_TABLET | Freq: Two times a day (BID) | ORAL | Status: DC

## 2021-12-26 MED ORDER — ROCURONIUM BROMIDE 10 MG/ML (PF) SYRINGE
PREFILLED_SYRINGE | INTRAVENOUS | Status: DC | PRN
  Administered 2021-12-26: 30 mg via INTRAVENOUS
  Administered 2021-12-26: 60 mg via INTRAVENOUS

## 2021-12-26 MED ORDER — THROMBIN 5000 UNITS EX SOLR
OROMUCOSAL | Status: DC | PRN
  Administered 2021-12-26: 5 mL via TOPICAL

## 2021-12-26 MED ORDER — DEXAMETHASONE SODIUM PHOSPHATE 10 MG/ML IJ SOLN
INTRAMUSCULAR | Status: DC | PRN
  Administered 2021-12-26: 5 mg via INTRAVENOUS

## 2021-12-26 MED ORDER — FENTANYL CITRATE (PF) 250 MCG/5ML IJ SOLN
INTRAMUSCULAR | Status: DC | PRN
Start: 2021-12-26 — End: 2021-12-26
  Administered 2021-12-26 (×2): 50 ug via INTRAVENOUS
  Administered 2021-12-26: 100 ug via INTRAVENOUS

## 2021-12-26 MED ORDER — 0.9 % SODIUM CHLORIDE (POUR BTL) OPTIME
TOPICAL | Status: DC | PRN
  Administered 2021-12-26: 1000 mL

## 2021-12-26 MED ORDER — PHENYLEPHRINE HCL-NACL 20-0.9 MG/250ML-% IV SOLN
INTRAVENOUS | Status: DC | PRN
  Administered 2021-12-26: 20 ug/min via INTRAVENOUS

## 2021-12-26 MED ORDER — LACTATED RINGERS IV SOLN: INTRAVENOUS | Status: DC

## 2021-12-26 MED ORDER — CEFAZOLIN SODIUM-DEXTROSE 2-4 GM/100ML-% IV SOLN
2.0000 g | Freq: Three times a day (TID) | INTRAVENOUS | Status: AC
  Administered 2021-12-26 (×2): 2 g via INTRAVENOUS
  Filled 2021-12-26 (×2): qty 100

## 2021-12-26 MED ORDER — FENTANYL CITRATE (PF) 100 MCG/2ML IJ SOLN
25.0000 ug | INTRAMUSCULAR | Status: DC | PRN
  Administered 2021-12-26 (×2): 25 ug via INTRAVENOUS

## 2021-12-26 MED ORDER — THROMBIN 20000 UNITS EX SOLR
CUTANEOUS | Status: AC
Start: 2021-12-26 — End: ?
  Filled 2021-12-26: qty 20000

## 2021-12-26 MED ORDER — ONDANSETRON HCL 4 MG/2ML IJ SOLN
4.0000 mg | Freq: Four times a day (QID) | INTRAMUSCULAR | Status: DC | PRN
  Administered 2021-12-26: 4 mg via INTRAVENOUS
  Filled 2021-12-26: qty 2

## 2021-12-26 MED ORDER — HYDROCHLOROTHIAZIDE 12.5 MG PO CAPS
12.5000 mg | ORAL_CAPSULE | Freq: Every day | ORAL | Status: DC
  Filled 2021-12-26: qty 1

## 2021-12-26 MED ORDER — TIZANIDINE HCL 4 MG PO TABS
4.0000 mg | ORAL_TABLET | Freq: Four times a day (QID) | ORAL | Status: DC | PRN
Start: 2021-12-26 — End: 2021-12-27
  Administered 2021-12-26 – 2021-12-27 (×2): 4 mg via ORAL
  Filled 2021-12-26 (×3): qty 1

## 2021-12-26 MED ORDER — ACETAMINOPHEN 500 MG PO TABS
1000.0000 mg | ORAL_TABLET | Freq: Four times a day (QID) | ORAL | Status: AC
  Administered 2021-12-26 – 2021-12-27 (×4): 1000 mg via ORAL
  Filled 2021-12-26 (×4): qty 2

## 2021-12-26 MED ORDER — OLOPATADINE HCL 0.1 % OP SOLN
1.0000 [drp] | Freq: Two times a day (BID) | OPHTHALMIC | Status: DC
  Filled 2021-12-26: qty 5

## 2021-12-26 MED ORDER — ONDANSETRON HCL 4 MG/2ML IJ SOLN
INTRAMUSCULAR | Status: DC | PRN
  Administered 2021-12-26: 4 mg via INTRAVENOUS

## 2021-12-26 MED ORDER — METFORMIN HCL 500 MG PO TABS
1000.0000 mg | ORAL_TABLET | Freq: Two times a day (BID) | ORAL | Status: DC
  Administered 2021-12-26 – 2021-12-27 (×3): 1000 mg via ORAL
  Filled 2021-12-26 (×3): qty 2

## 2021-12-26 MED ORDER — DEXTROSE 50 % IV SOLN
12.5000 g | INTRAVENOUS | Status: DC
  Filled 2021-12-26: qty 50

## 2021-12-26 MED ORDER — DEXTROSE 50 % IV SOLN
INTRAVENOUS | Status: AC
  Administered 2021-12-26: 25 mL
  Filled 2021-12-26: qty 50

## 2021-12-26 MED ORDER — GABAPENTIN 300 MG PO CAPS
300.0000 mg | ORAL_CAPSULE | ORAL | Status: AC
  Administered 2021-12-26: 300 mg via ORAL
  Filled 2021-12-26: qty 1

## 2021-12-26 MED ORDER — BACITRACIN ZINC 500 UNIT/GM EX OINT
TOPICAL_OINTMENT | CUTANEOUS | Status: AC
  Filled 2021-12-26: qty 28.35

## 2021-12-26 MED ORDER — SODIUM CHLORIDE 0.9% FLUSH
3.0000 mL | Freq: Two times a day (BID) | INTRAVENOUS | Status: DC
  Administered 2021-12-26: 3 mL via INTRAVENOUS

## 2021-12-26 MED ORDER — CHLORHEXIDINE GLUCONATE 0.12 % MT SOLN
15.0000 mL | Freq: Once | OROMUCOSAL | Status: AC
  Administered 2021-12-26: 15 mL via OROMUCOSAL
  Filled 2021-12-26: qty 15

## 2021-12-26 SURGICAL SUPPLY — 57 items
APL SKNCLS STERI-STRIP NONHPOA (GAUZE/BANDAGES/DRESSINGS) ×1
BAG COUNTER SPONGE SURGICOUNT (BAG) ×2 IMPLANT
BAG SPNG CNTER NS LX DISP (BAG) ×1
BAND INSRT 18 STRL LF DISP RB (MISCELLANEOUS)
BAND RUBBER #18 3X1/16 STRL (MISCELLANEOUS) IMPLANT
BASKET BONE COLLECTION (BASKET) ×1 IMPLANT
BENZOIN TINCTURE PRP APPL 2/3 (GAUZE/BANDAGES/DRESSINGS) ×3 IMPLANT
BIT DRILL 2.3X12 (BIT) ×1 IMPLANT
BLADE CLIPPER SURG (BLADE) ×1 IMPLANT
BUR CARBIDE MATCH 3.0 (BURR) ×1 IMPLANT
CANISTER SUCT 3000ML PPV (MISCELLANEOUS) ×2 IMPLANT
DRAPE C-ARM 42X72 X-RAY (DRAPES) ×4 IMPLANT
DRAPE LAPAROTOMY 100X72 PEDS (DRAPES) ×2 IMPLANT
DRAPE MICROSCOPE LEICA (MISCELLANEOUS) IMPLANT
DRSG OPSITE POSTOP 4X6 (GAUZE/BANDAGES/DRESSINGS) ×1 IMPLANT
DURAPREP 26ML APPLICATOR (WOUND CARE) ×2 IMPLANT
ELECT REM PT RETURN 9FT ADLT (ELECTROSURGICAL) ×2
ELECTRODE REM PT RTRN 9FT ADLT (ELECTROSURGICAL) ×1 IMPLANT
EVACUATOR 1/8 PVC DRAIN (DRAIN) ×1 IMPLANT
GAUZE 4X4 16PLY ~~LOC~~+RFID DBL (SPONGE) IMPLANT
GAUZE SPONGE 4X4 12PLY STRL (GAUZE/BANDAGES/DRESSINGS) ×2 IMPLANT
GLOVE BIO SURGEON STRL SZ7 (GLOVE) ×3 IMPLANT
GLOVE BIO SURGEON STRL SZ8 (GLOVE) ×2 IMPLANT
GLOVE BIOGEL PI IND STRL 7.0 (GLOVE) IMPLANT
GLOVE BIOGEL PI INDICATOR 7.0 (GLOVE) ×3
GOWN STRL REUS W/ TWL LRG LVL3 (GOWN DISPOSABLE) IMPLANT
GOWN STRL REUS W/ TWL XL LVL3 (GOWN DISPOSABLE) ×1 IMPLANT
GOWN STRL REUS W/TWL 2XL LVL3 (GOWN DISPOSABLE) IMPLANT
GOWN STRL REUS W/TWL LRG LVL3 (GOWN DISPOSABLE) ×6
GOWN STRL REUS W/TWL XL LVL3 (GOWN DISPOSABLE) ×2
HEMOSTAT POWDER KIT SURGIFOAM (HEMOSTASIS) ×1 IMPLANT
KIT BASIN OR (CUSTOM PROCEDURE TRAY) ×2 IMPLANT
KIT TURNOVER KIT B (KITS) ×2 IMPLANT
NDL SPNL 20GX3.5 QUINCKE YW (NEEDLE) IMPLANT
NEEDLE HYPO 22GX1.5 SAFETY (NEEDLE) ×2 IMPLANT
NEEDLE SPNL 20GX3.5 QUINCKE YW (NEEDLE) IMPLANT
NS IRRIG 1000ML POUR BTL (IV SOLUTION) ×2 IMPLANT
PACK LAMINECTOMY NEURO (CUSTOM PROCEDURE TRAY) ×2 IMPLANT
PAD ARMBOARD 7.5X6 YLW CONV (MISCELLANEOUS) ×3 IMPLANT
PIN MAYFIELD SKULL DISP (PIN) ×2 IMPLANT
PUTTY DBM 5CC (Putty) ×1 IMPLANT
ROD LORD INVICTUS 3.5X45 (Rod) ×1 IMPLANT
ROD TI LORDOTIC 3.5X50 (Rod) ×1 IMPLANT
SCREW PA 3.5X12 (Screw) ×2 IMPLANT
SCREW POLYAXIAL 3.5 X 14 (Screw) ×6 IMPLANT
SCREW SET ATEC (Screw) ×8 IMPLANT
SPONGE SURGIFOAM ABS GEL 100 (HEMOSTASIS) ×2 IMPLANT
SPONGE T-LAP 4X18 ~~LOC~~+RFID (SPONGE) IMPLANT
STRIP CLOSURE SKIN 1/2X4 (GAUZE/BANDAGES/DRESSINGS) ×2 IMPLANT
SUT VIC AB 0 CT1 18XCR BRD8 (SUTURE) ×1 IMPLANT
SUT VIC AB 0 CT1 8-18 (SUTURE) ×2
SUT VIC AB 2-0 CP2 18 (SUTURE) ×2 IMPLANT
SUT VIC AB 3-0 SH 8-18 (SUTURE) ×2 IMPLANT
SYR BULB IRRIG 60ML STRL (SYRINGE) ×1 IMPLANT
TOWEL GREEN STERILE (TOWEL DISPOSABLE) ×2 IMPLANT
TOWEL GREEN STERILE FF (TOWEL DISPOSABLE) ×2 IMPLANT
WATER STERILE IRR 1000ML POUR (IV SOLUTION) ×2 IMPLANT

## 2021-12-27 ENCOUNTER — Other Ambulatory Visit: Payer: Self-pay | Admitting: Physician Assistant

## 2021-12-27 ENCOUNTER — Encounter (HOSPITAL_COMMUNITY): Payer: Self-pay | Admitting: Neurological Surgery

## 2021-12-27 ENCOUNTER — Ambulatory Visit: Payer: Medicare Other

## 2021-12-27 LAB — GLUCOSE, CAPILLARY: Glucose-Capillary: 228 mg/dL — ABNORMAL HIGH (ref 70–99)

## 2021-12-27 MED ORDER — TIZANIDINE HCL 4 MG PO TABS: 4.0000 mg | ORAL_TABLET | Freq: Four times a day (QID) | ORAL | 1 refills | Status: DC | PRN

## 2021-12-27 MED ORDER — SUGAMMADEX SODIUM 200 MG/2ML IV SOLN
INTRAVENOUS | Status: DC | PRN
  Administered 2021-12-26: 200 mg via INTRAVENOUS

## 2021-12-27 MED ORDER — HYDROCHLOROTHIAZIDE 12.5 MG PO TABS: 12.5000 mg | ORAL_TABLET | Freq: Every day | ORAL | Status: DC

## 2021-12-27 MED ORDER — TRAMADOL HCL 50 MG PO TABS
50.0000 mg | ORAL_TABLET | Freq: Four times a day (QID) | ORAL | 1 refills | Status: DC | PRN
Start: 2021-12-27 — End: 2022-02-23

## 2022-01-01 ENCOUNTER — Other Ambulatory Visit: Payer: Self-pay | Admitting: Physician Assistant

## 2022-01-02 ENCOUNTER — Other Ambulatory Visit: Payer: Self-pay | Admitting: Cardiology

## 2022-01-12 ENCOUNTER — Ambulatory Visit: Payer: Medicare Other | Admitting: Physical Therapy

## 2022-01-13 ENCOUNTER — Other Ambulatory Visit: Payer: Self-pay | Admitting: Physician Assistant

## 2022-01-17 ENCOUNTER — Other Ambulatory Visit: Payer: Self-pay | Admitting: Family Medicine

## 2022-01-17 DIAGNOSIS — Z794 Long term (current) use of insulin: Secondary | ICD-10-CM

## 2022-01-20 ENCOUNTER — Other Ambulatory Visit: Payer: Self-pay | Admitting: Family Medicine

## 2022-01-26 ENCOUNTER — Other Ambulatory Visit: Payer: Self-pay | Admitting: Cardiology

## 2022-01-26 ENCOUNTER — Other Ambulatory Visit: Payer: Self-pay | Admitting: Physician Assistant

## 2022-01-26 DIAGNOSIS — E103593 Type 1 diabetes mellitus with proliferative diabetic retinopathy without macular edema, bilateral: Secondary | ICD-10-CM | POA: Diagnosis not present

## 2022-01-26 DIAGNOSIS — H35033 Hypertensive retinopathy, bilateral: Secondary | ICD-10-CM | POA: Diagnosis not present

## 2022-01-26 DIAGNOSIS — H3582 Retinal ischemia: Secondary | ICD-10-CM | POA: Diagnosis not present

## 2022-01-26 DIAGNOSIS — E103592 Type 1 diabetes mellitus with proliferative diabetic retinopathy without macular edema, left eye: Secondary | ICD-10-CM | POA: Diagnosis not present

## 2022-01-26 DIAGNOSIS — H35373 Puckering of macula, bilateral: Secondary | ICD-10-CM | POA: Diagnosis not present

## 2022-02-01 ENCOUNTER — Telehealth: Payer: Self-pay | Admitting: Family Medicine

## 2022-02-01 NOTE — Telephone Encounter (Signed)
Patient called asking if another referral could be sent to [facility] for balance and walking.   Please advise.

## 2022-02-02 ENCOUNTER — Other Ambulatory Visit: Payer: Self-pay

## 2022-02-02 DIAGNOSIS — R269 Unspecified abnormalities of gait and mobility: Secondary | ICD-10-CM

## 2022-02-02 DIAGNOSIS — G8929 Other chronic pain: Secondary | ICD-10-CM

## 2022-02-08 DIAGNOSIS — D509 Iron deficiency anemia, unspecified: Secondary | ICD-10-CM | POA: Diagnosis not present

## 2022-02-08 DIAGNOSIS — R112 Nausea with vomiting, unspecified: Secondary | ICD-10-CM | POA: Diagnosis not present

## 2022-02-08 DIAGNOSIS — K5900 Constipation, unspecified: Secondary | ICD-10-CM | POA: Diagnosis not present

## 2022-02-08 DIAGNOSIS — K589 Irritable bowel syndrome without diarrhea: Secondary | ICD-10-CM | POA: Diagnosis not present

## 2022-02-14 ENCOUNTER — Telehealth: Payer: Self-pay | Admitting: Internal Medicine

## 2022-02-14 MED ORDER — CARVEDILOL 25 MG PO TABS: ORAL_TABLET | ORAL | 0 refills | Status: DC

## 2022-02-14 NOTE — Telephone Encounter (Signed)
Pt's medication was sent to pt's pharmacy as requested, asking pt to make overdue appt with Dr. Curt Bears before anymore refills. Confirmation received

## 2022-02-14 NOTE — Telephone Encounter (Signed)
Please advise if OK to refill. Last seen 10/2020 at [facility] office. Patient scheduled appt with Dr. Saunders Revel on 03/30/22. Thank you!

## 2022-02-14 NOTE — Telephone Encounter (Signed)
*  STAT* If patient is at the pharmacy, call can be transferred to refill team.   1. Which medications need to be refilled? (please list name of each medication and dose if known) carvedilol (COREG) 25 MG tablet  2. Which pharmacy/location (including street and city if local pharmacy) is medication to be sent to? [id number] [place] [address]. Do they need a 30 day or 90 day supply? 90   Patient appt is schd for 9/28

## 2022-02-21 ENCOUNTER — Ambulatory Visit: Payer: Medicare Other | Admitting: Physical Therapy

## 2022-02-22 ENCOUNTER — Other Ambulatory Visit: Payer: Self-pay | Admitting: Cardiology

## 2022-02-23 ENCOUNTER — Ambulatory Visit (INDEPENDENT_AMBULATORY_CARE_PROVIDER_SITE_OTHER): Payer: Medicare Other

## 2022-02-23 ENCOUNTER — Ambulatory Visit (INDEPENDENT_AMBULATORY_CARE_PROVIDER_SITE_OTHER): Payer: Medicare Other | Admitting: Podiatry

## 2022-02-23 DIAGNOSIS — S92515A Nondisplaced fracture of proximal phalanx of left lesser toe(s), initial encounter for closed fracture: Secondary | ICD-10-CM

## 2022-02-23 DIAGNOSIS — M79675 Pain in left toe(s): Secondary | ICD-10-CM

## 2022-02-23 DIAGNOSIS — M79672 Pain in left foot: Secondary | ICD-10-CM

## 2022-02-23 DIAGNOSIS — M5412 Radiculopathy, cervical region: Secondary | ICD-10-CM | POA: Diagnosis not present

## 2022-02-23 DIAGNOSIS — M79674 Pain in right toe(s): Secondary | ICD-10-CM | POA: Diagnosis not present

## 2022-02-23 DIAGNOSIS — B351 Tinea unguium: Secondary | ICD-10-CM | POA: Diagnosis not present

## 2022-02-23 NOTE — Progress Notes (Signed)
Subjective: 66 year old female presents the office today for follow-up evaluation of nail fungus no other nails trimmed today as they are thickened elongated has difficulty trimming them.  She is continue with topical medication.  She also states that she recently hit her left fourth toe and she also cracked the nail.  This occurred on Saturday, August 19.  No recent treatment.  Last A1c was 9.2 and 12/22/2021  Objective: AAO x3, NAD-presents with interpreter DP/PT pulses palpable bilaterally, CRT less than 3 seconds Nails appear to be hypertrophic, dystrophic with yellow-brown discoloration.  No edema, erythema to the toenail sites.  Tenderness nails 1-5 bilaterally.  Left fifth toe nail is mildly split.  No open lesions bilaterally.  There is tenderness palpation left fourth toe.  There is mild edema there is no erythema.  There is no open lesions. No pain with calf compression, swelling, warmth, erythema  Assessment: Symptomatic onychomycosis; left fourth toe fracture  Plan: -All treatment options discussed with the patient including all alternatives, risks, complications.  -X-rays obtained reviewed.  3 views left foot were obtained.  Fracture noted in the proximal planes in the fourth toe. -I discussed with her how to buddy splint the toe.  She is comfortable wearing her current shoes.  Discussed wearing stiffer soled shoe to help facilitate healing. -Nails sharply debrided x10 without any complications or bleeding.  Continue [place]. -Patient encouraged to call the office with any questions, concerns, change in symptoms.   Trula Slade DPM

## 2022-02-25 ENCOUNTER — Other Ambulatory Visit: Payer: Self-pay | Admitting: Family Medicine

## 2022-02-25 DIAGNOSIS — E119 Type 2 diabetes mellitus without complications: Secondary | ICD-10-CM

## 2022-02-28 ENCOUNTER — Ambulatory Visit: Payer: Medicare Other

## 2022-03-01 ENCOUNTER — Other Ambulatory Visit: Payer: Self-pay | Admitting: Podiatry

## 2022-03-01 DIAGNOSIS — S92515A Nondisplaced fracture of proximal phalanx of left lesser toe(s), initial encounter for closed fracture: Secondary | ICD-10-CM

## 2022-03-07 ENCOUNTER — Ambulatory Visit (INDEPENDENT_AMBULATORY_CARE_PROVIDER_SITE_OTHER): Payer: Medicare Other | Admitting: Student

## 2022-03-07 ENCOUNTER — Other Ambulatory Visit: Payer: Self-pay

## 2022-03-07 ENCOUNTER — Encounter: Payer: Self-pay | Admitting: Student

## 2022-03-07 VITALS — BP 146/72 | HR 96 | Wt 131.4 lb

## 2022-03-07 DIAGNOSIS — E119 Type 2 diabetes mellitus without complications: Secondary | ICD-10-CM | POA: Diagnosis not present

## 2022-03-07 DIAGNOSIS — I10 Essential (primary) hypertension: Secondary | ICD-10-CM | POA: Diagnosis not present

## 2022-03-07 DIAGNOSIS — Z794 Long term (current) use of insulin: Secondary | ICD-10-CM

## 2022-03-07 DIAGNOSIS — E782 Mixed hyperlipidemia: Secondary | ICD-10-CM

## 2022-03-07 MED ORDER — FLUTICASONE PROPIONATE 50 MCG/ACT NA SUSP: NASAL | 1 refills | Status: DC

## 2022-03-07 NOTE — Progress Notes (Signed)
  SUBJECTIVE:   CHIEF COMPLAINT / HPI:   Diabetes check up  DM Meds: Victoza 1.8 mg wk, Metformin 1000 mg BID, glargine 10 units daily, novolog 2-4 units w/ meals Compliance: taking daily Fasting CBG: 101 CBG range: 100-200 Last A1c: 12/22/21 - 9.2 Symptoms: hypoglycemia infrequently if she hasn't eaten something. Happening 1 x a month. Denies any polyuria, polydipsia Had eye exam this year   HLD Meds: crestor 5 mg daily Lab Results  Component Value Date   CHOL 212 (H) 03/07/2022   HDL 108 03/07/2022   LDLCALC 92 03/07/2022   LDLDIRECT 78 07/10/2013   TRIG 66 03/07/2022   CHOLHDL 2.0 03/07/2022  Taking daily   HTN: Meds: Valsartan 160 mg daily, HCTZ 12.5 mg daily, carvedilol 25 mg  Been out of meds for 2 days, no refill until 03/11/22 appoitnment with cardiologist. Out of carvedilol Compliance: daily   Health [place]: Mammo: 05/25/21 - normal repeat this year Colonoscopy: 12/06/21 - repeat 12/07/31  Fractured Left second from pinky toe, had been budy taping, feels better now. Going for f/u in October. Broken 2-3 weeks ago.   Is doing well since the surgery thinks that things are improved.   PERTINENT  PMH / PSH: DM, HTN, HLD   OBJECTIVE:  BP (!) 146/72   Pulse 96   Wt 131 lb 6.4 oz (59.6 kg)   SpO2 99%   BMI 22.55 kg/m   General: NAD, pleasant, able to participate in exam Cardiac: RRR, S1/S2, no murmurs auscultated. Respiratory: CTAB, normal effort, no wheezes, rales or rhonchi Abdomen: soft, non-tender, non-distended, normoactive bowel sounds Extremities: warm and well perfused, no edema or cyanosis. Skin: warm and dry, no rashes noted Neuro: alert, no obvious focal deficits, speech normal Psych: Normal affect and mood  ASSESSMENT/PLAN:  Hypertension Patient BP well controlled, but slightly elevated today as patient is out of carvedilol. Patient to refill this med at end of week.  -Continue valsartan 160 -Continue HCTZ 12.5 -Continue Carvedilol  25 -BMP  Hyperlipidemia Continues to take Crestor 5 mg daily -Lipid panel  Diabetes ([place]) Patient reports good fasting CBG and CBG range. Patient reports infrequent episodes of hypoglycemia, if she hasn't eaten anything. Patient's a1c was last checked in June and was 9.2. Patient eligible for a1c check at end of month. -Continue Victoza 1.8 mg wk -Continue Metformin 1000 mg BID -Continue Glargine 10 units daily -Continue Novolog 2-4 units w/ meals    Orders Placed This Encounter  Procedures   Basic Metabolic Panel   Lipid Panel   Meds ordered this encounter  Medications   fluticasone (FLONASE) 50 MCG/ACT nasal spray    Sig: 2 puffs each nostril daily    Dispense:  48 mL    Refill:  1   No follow-ups on file. '@SIGNNOTE'$ @

## 2022-03-07 NOTE — Patient Instructions (Addendum)
It was great to see you! Thank you for allowing me to participate in your care!  I recommend that you always bring your medications to each appointment as this makes it easy to ensure we are on the correct medications and helps [name] not miss when refills are needed.  Our plans for today:  - Diabetes  No change to current diabetic routine I'll place an order for an a1c, and you can call and schedule a lab visit to have it checked at the end of the month - Lipid  Checking your lipids today  - High blood pressure Your blood pressure is elevated, but it's likely due to you running out of medicine. Continue you your home meds once refilled Checking kidney function and electrolytes  - Shinrgirx vaccine Call your pharmacy and ask if they have in stock, you can schedule an appointment with them to receive it! (We do not cary this in clinic)  We are checking some labs today, I will call you if they are abnormal will send you a MyChart message or a letter if they are normal.  If you do not hear about your labs in the next 2 weeks please let us know.  Take care and seek immediate care sooner if you develop any concerns.   Dr. Holley Bouche, MD [place]

## 2022-03-08 ENCOUNTER — Other Ambulatory Visit: Payer: Self-pay | Admitting: Family Medicine

## 2022-03-08 ENCOUNTER — Telehealth: Payer: Self-pay | Admitting: Cardiology

## 2022-03-08 LAB — LIPID PANEL
Chol/HDL Ratio: 2 ratio (ref 0.0–4.4)
Cholesterol, Total: 212 mg/dL — ABNORMAL HIGH (ref 100–199)
HDL: 108 mg/dL (ref >39)
LDL Chol Calc (NIH): 92 mg/dL (ref 0–99)
Triglycerides: 66 mg/dL (ref 0–149)
VLDL Cholesterol Cal: 12 mg/dL (ref 5–40)

## 2022-03-08 LAB — BASIC METABOLIC PANEL
BUN/Creatinine Ratio: 22 (ref 12–28)
BUN: 14 mg/dL (ref 8–27)
CO2: 25 mmol/L (ref 20–29)
Calcium: 10.1 mg/dL (ref 8.7–10.3)
Chloride: 101 mmol/L (ref 96–106)
Creatinine, Ser: 0.64 mg/dL (ref 0.57–1.00)
Glucose: 285 mg/dL — ABNORMAL HIGH (ref 70–99)
Potassium: 4.1 mmol/L (ref 3.5–5.2)
Sodium: 143 mmol/L (ref 134–144)
eGFR: 97 mL/min/{1.73_m2} (ref >59)

## 2022-03-08 MED ORDER — ROSUVASTATIN CALCIUM 5 MG PO TABS: ORAL_TABLET | ORAL | 0 refills | Status: DC

## 2022-03-08 NOTE — Telephone Encounter (Signed)
*  STAT* If patient is at the pharmacy, call can be transferred to refill team.   1. Which medications need to be refilled? (please list name of each medication and dose if known)   rosuvastatin (CRESTOR) 5 MG tablet    2. Which pharmacy/location (including street and city if local pharmacy) is medication to be sent to? [id number] [place], [place] - [address]. Do they need a 30 day or 90 day supply? 951 Pt has a f/u with Dr. ESaunders Revel9/28/23 and EP Ursuy,PA for 03/21/22.

## 2022-03-08 NOTE — Telephone Encounter (Signed)
Pt's medication was sent to pt's pharmacy as requested. Confirmation received.  °

## 2022-03-09 ENCOUNTER — Other Ambulatory Visit: Payer: Self-pay | Admitting: Cardiology

## 2022-03-15 ENCOUNTER — Encounter: Payer: Self-pay | Admitting: Student

## 2022-03-15 NOTE — Assessment & Plan Note (Signed)
Patient reports good fasting CBG and CBG range. Patient reports infrequent episodes of hypoglycemia, if she hasn't eaten anything. Patient's a1c was last checked in June and was 9.2. Patient eligible for a1c check at end of month. -Continue Victoza 1.8 mg wk -Continue Metformin 1000 mg BID -Continue Glargine 10 units daily -Continue Novolog 2-4 units w/ meals

## 2022-03-15 NOTE — Assessment & Plan Note (Addendum)
Patient BP well controlled, but slightly elevated today as patient is out of carvedilol. Patient to refill this med at end of week.  -Continue valsartan 160 -Continue HCTZ 12.5 -Continue Carvedilol 25 -BMP

## 2022-03-15 NOTE — Assessment & Plan Note (Signed)
Continues to take Crestor 5 mg daily -Lipid panel

## 2022-03-15 NOTE — Assessment & Plan Note (Deleted)
Continues to take Crestor 5 mg daily -Lipid panel

## 2022-03-15 NOTE — Assessment & Plan Note (Signed)
>>  ASSESSMENT AND PLAN FOR DIABETES (HCC) WRITTEN ON 03/15/2022  4:03 AM BY JENNELLE RIIS, MD  Patient reports good fasting CBG and CBG range. Patient reports infrequent episodes of hypoglycemia, if she hasn't eaten anything. Patient's a1c was last checked in June and was 9.2. Patient eligible for a1c check at end of month. -Continue Victoza  1.8 mg wk -Continue Metformin  1000 mg BID -Continue Glargine 10 units daily -Continue Novolog  2-4 units w/ meals

## 2022-03-16 DIAGNOSIS — H31093 Other chorioretinal scars, bilateral: Secondary | ICD-10-CM | POA: Diagnosis not present

## 2022-03-16 DIAGNOSIS — H43813 Vitreous degeneration, bilateral: Secondary | ICD-10-CM | POA: Diagnosis not present

## 2022-03-16 DIAGNOSIS — H35373 Puckering of macula, bilateral: Secondary | ICD-10-CM | POA: Diagnosis not present

## 2022-03-16 DIAGNOSIS — E103593 Type 1 diabetes mellitus with proliferative diabetic retinopathy without macular edema, bilateral: Secondary | ICD-10-CM | POA: Diagnosis not present

## 2022-03-17 ENCOUNTER — Other Ambulatory Visit: Payer: Self-pay | Admitting: Cardiology

## 2022-03-19 NOTE — Progress Notes (Deleted)
Cardiology Office Note Date:  03/19/2022  Patient ID:  Alicia [name], Alicia [name] 12-Dec-1955, MRN [id number] PCP:  Holley Bouche, MD  Cardiologist:  Dr. Saunders Revel Electrophysiologist: Dr. Curt Bears   *** Today's visit is held today using sign language interpretor  *** Sarina Ser w/[facility] services    Chief Complaint: *** annual visit  History of Present Illness: Alicia [name] is a 66 y.o. female with history of HTN, HLD, SVT, DM, deafness, OSA (she denies this as an ongoing diagnosis for her, has never been told she needed a CPAP),   Historically CP felt to be atypical with neg stress testing  She comes in today to be seen for Dr. Curt Bears.  Referred to him Nov 2020 for palpitations and SVT noted on a monitor.  At that time was still waiting to receive CPAP for her sleep apnea.  She was still having palpitations despite coreg.  Discussed ablation though the pt preferred ongoing medical management first and daily dilt was added.  She had an ER visit in Nov 2020 with CP, nausea, felt to be GI ultimately and her prilosec increased. HS Trop 3 > 4  I saw her Feb 2021 She feels well from a cardiac perspective, no CP, palpitations.  She is limited by b/l knee and back pain, and has been struggling with GI issues.   She has days that she gets so bloated , is very uncomfortable, her clothes are tight, associated with nausea and sometimes vomiting as well.  Mostly triggers by eating, and can be improved with Mylanta, sometimes a laxative will help with BM. She was to have been referred to GI via her PMD office though has been a few weeks and not hear back yet.  She is due to see her again in a week or two.  Says at their last visit her knees were hurting her so bad that they concentrated on that. No palpitations, tachycardias, no dizzy spells, no near syncope or syncope. She has not yet been able to get around to following up on her CPAP machine.  Says she just has som much to take care of, work, home, so  on. No changes were made Encouraged to f/u on GI referral  I saw her May 2022 She is struggling with her DM especially Has had many changes in her meds in the last 3 months and starting to get back on track Follows this with her PMD and their pharmacists S he has had bloating but thinks that is her Linzess. Infrequent palpitations and rarely uses the PRN diltiazem She will sometimes feel like her HR is fast though this usually coorelates with very high or low sugars No CP, no near syncope or syncope She does not have any cardiac concerns today No changes were made, planned for annual visits and PRN  *** symptoms *** SVT *** labs, lipids...  Past Medical History:  Diagnosis Date   Anemia    Arthritis    back and knees   ARTHRITIS, KNEE 09/17/2007   Asthma 04/04/2010   pt states she does not have asthma   Cataract 01/07/2019   Closed fracture of distal end of left radius 11/01/2015   Complete deafness    Meningitis at age 53   Deaf    Diabetes mellitus    Diabetic neuropathy ([place])    Ganglion cyst 06/22/2011   Gastroparesis    GERD (gastroesophageal reflux disease)    H/O: C-section    Hyperlipidemia    Hypertension  Neuromuscular disorder ([place])    diabetic neuropathy   PSVT (paroxysmal supraventricular tachycardia) ([place]) 11/09/2017   Event monitor 09/14/2017 - Predominantly sinus rhythm with episodes of narrow-complex tachycardia suggestive of paroxysmal supraventricular tachycardia.   S/P appy     Past Surgical History:  Procedure Laterality Date   APPENDECTOMY     cardiolyte EF 77%, no ischemia in 2006  2006   [place] Right 04/20/2015   Procedure: RIGHT CARPAL TUNNEL RELEASE;  Surgeon: Daryll Brod, MD;  Location: [facility];  Service: Orthopedics;  Laterality: Right;   CARPAL TUNNEL RELEASE Left 11/04/2015   Procedure: CARPAL TUNNEL RELEASE;  Surgeon: Daryll Brod, MD;  Location: [facility];  Service: Orthopedics;   Laterality: Left;   CESAREAN SECTION     OPEN REDUCTION INTERNAL FIXATION (ORIF) DISTAL RADIAL FRACTURE Left 11/04/2015   Procedure: OPEN REDUCTION INTERNAL FIXATION (ORIF) LEFT DISTAL RADIAL FRACTURE POSSIBLE BONE GRAFT;  Surgeon: Daryll Brod, MD;  Location: [facility];  Service: Orthopedics;  Laterality: Left;   POSTERIOR CERVICAL FUSION/FORAMINOTOMY N/A 12/26/2021   Procedure: Posterior cervical fusion with lateral mass fixation - Cervical Three-Cervical Six, Cervical Laminectomy Cervical Three-Cervica; Five;  Surgeon: Eustace Moore, MD;  Location: [place];  Service: Neurosurgery;  Laterality: N/A;   TRIGGER FINGER RELEASE Right 04/20/2015   Procedure: RELEASE TRIGGER FINGER/A-1 PULLEY RIGHT MIDDLE FINGER,RIGHT RING FINGER;  Surgeon: Daryll Brod, MD;  Location: [facility];  Service: Orthopedics;  Laterality: Right;   TUBAL LIGATION     ULNAR NERVE TRANSPOSITION Right 04/20/2015   Procedure: RIGHT ULNAR NERVE DECOMPRESSION;  Surgeon: Daryll Brod, MD;  Location: [place] [facility];  Service: Orthopedics;  Laterality: Right;   UTERINE FIBROID EMBOLIZATION  2007   WRIST SURGERY     Cyst removed on left   WRIST SURGERY Right 1985   tendon repair R wrist    Current Outpatient Medications  Medication Sig Dispense Refill   ACCU-CHEK AVIVA PLUS test strip USE TO CHECK BLOOD GLUCOSE LEVELS 3X PER DAY. DX CODE: E11.8 100 strip 2   ACCU-CHEK SOFTCLIX LANCETS lancets TEST 4 TIMES A DAY 200 each 0   acetaminophen (TYLENOL) 650 MG CR tablet Take 2 tablets (1,300 mg total) by mouth every 8 (eight) hours as needed for pain. 120 tablet 3   aspirin 81 MG chewable tablet Chew 81 mg by mouth daily.     carvedilol (COREG) 25 MG tablet TAKE 1 TABLET BY MOUTH TWICE A DAY. 30 tablet 0   cholecalciferol (VITAMIN D) 1000 UNITS tablet Take 1,000 Units by mouth daily.     ciclopirox (PENLAC) 8 % solution Apply topically at bedtime. Apply over nail and surrounding skin. Apply daily over  previous coat. After seven (7) days, may remove with alcohol and continue cycle. (Patient not taking: Reported on 12/16/2021) 6.6 mL 2   Cyanocobalamin (VITAMIN B 12) 500 MCG TABS Take 500 mcg by mouth daily at 6 (six) AM.     diclofenac Sodium (VOLTAREN) 1 % GEL Apply 2 g topically 4 (four) times daily. Rub into affected area of foot 2 to 4 times daily (Patient taking differently: Apply 2 g topically 4 (four) times daily as needed (pain). Rub into affected area of foot 2 to 4 times daily) 100 g 2   diltiazem (CARDIZEM CD) 240 MG 24 hr capsule TAKE 1 CAPSULE BY MOUTH EVERY DAY 90 capsule 1   diltiazem (CARDIZEM) 30 MG tablet TAKE 1 TABLET BY MOUTH AS NEEDED (  HEART RACING OR PALPITATIONS THAT LAST LONGER THAN 15 MINS). 90 tablet 3   DULoxetine (CYMBALTA) 60 MG capsule TAKE 1 CAPSULE BY MOUTH EVERY DAY 90 capsule 3   Efinaconazole 10 % SOLN Apply 1 drop topically daily. (Patient not taking: Reported on 12/16/2021) 4 mL 11   famotidine (PEPCID) 20 MG tablet TAKE 1 TABLET BY MOUTH TWICE A DAY 180 tablet 1   ferrous sulfate 325 (65 FE) MG tablet Take 325 mg by mouth daily with breakfast.     fluticasone (FLONASE) 50 MCG/ACT nasal spray 2 puffs each nostril daily 48 mL 1   gabapentin (NEURONTIN) 800 MG tablet TAKE 1 TABLET BY MOUTH TWICE A DAY AS NEEDED 60 tablet 2   hydrochlorothiazide (MICROZIDE) 12.5 MG capsule TAKE 1 CAPSULE BY MOUTH EVERY DAY 90 capsule 1   hydrocortisone (ANUSOL-HC) 2.5 % rectal cream Place 1 application rectally 2 (two) times daily. (Patient not taking: Reported on 12/16/2021) 30 g 0   Insulin Glargine (BASAGLAR KWIKPEN) 100 UNIT/ML Inject 10 Units into the skin daily. 3 mL 5   Insulin Pen Needle 31G X 5 MM MISC 1 Container by Does not apply route once as needed for up to 1 dose. 100 each 11   LINZESS 72 MCG capsule Take 72 mcg by mouth every morning.     liraglutide (VICTOZA) 18 MG/3ML SOPN Inject 1.8 mg into the skin once a week. 3 mL 4   meloxicam (MOBIC) 15 MG tablet TAKE 1 TABLET  BY MOUTH EVERY DAY 60 tablet 1   metFORMIN (GLUCOPHAGE) 1000 MG tablet TAKE 1 TABLET BY MOUTH TWICE A DAY 180 tablet 1   naproxen (NAPROSYN) 500 MG tablet Take 1 tablet (500 mg total) by mouth 2 (two) times daily with a meal. (Patient taking differently: Take 500 mg by mouth 2 (two) times daily as needed for moderate pain.) 30 tablet 0   NOVOLOG FLEXPEN 100 UNIT/ML FlexPen INJECT 2-4 UNITS INTO THE SKIN 3 (THREE) TIMES DAILY WITH MEALS. 3 mL 5   Olopatadine HCl 0.2 % SOLN PLACE 1 DROP IN BOTH EYES DAILY     pantoprazole (PROTONIX) 40 MG tablet Take 40 mg by mouth every morning.     rosuvastatin (CRESTOR) 5 MG tablet TAKE 1 TABLET (5 MG TOTAL) BY MOUTH 2 (TWO) TIMES A WEEK. 8 tablet 0   valsartan (DIOVAN) 160 MG tablet TAKE 1 TABLET BY MOUTH EVERYDAY AT BEDTIME 90 tablet 1   No current facility-administered medications for this visit.    Allergies:   Sulfonamide derivatives, Lipitor [atorvastatin calcium], Ramipril, and Trulicity [dulaglutide]   Social History:  The patient  reports that she has never smoked. She has never used smokeless tobacco. She reports current alcohol use of about 3.0 standard drinks of alcohol per week. She reports that she does not use drugs.   Family History:  The patient's family history includes Cancer in her maternal aunt and maternal grandmother; Diabetes in her father; Heart attack (age of onset: 49) in her mother; Hypertension in her mother.  ROS:  Please see the history of present illness.  All other systems are reviewed and otherwise negative.   PHYSICAL EXAM:  VS:  There were no vitals taken for this visit. BMI: There is no height or weight on file to calculate BMI. Well nourished, well developed, in no acute distress  HEENT: normocephalic, atraumatic  Neck: no JVD, carotid bruits or masses Cardiac: *** RRR; no significant murmurs, no rubs, or gallops Lungs:  *** CTA b/l,  no wheezing, rhonchi or rales  Abd: soft, nontender, obese MS: no deformity or  atrophy Ext: *** no edema  Skin: warm and dry, no rash Neuro:  No gross deficits appreciated Psych: euthymic mood, full affect    EKG:  Done today and reviewed by myself ***   Lexiscan Myoview 09/14/2017: Nuclear stress EF: 61%. The study is normal. The left ventricular ejection fraction is normal (55-65%).   1. EF 61%, normal wall motion.  2. No evidence for ischemia or infarction by perfusion images.     Normal study.  _______________   Cardiac Monitor  09/14/2017 to 09/27/2017: The patient was monitored for 14 days. The predominant rhythm was sinus; average heart rate during triggered events was 94 bpm. Two episodes of narrow complex tachycardia were observed, suggestive of SVT. No ventricular arrhythmias or prolonged pauses were seen. Patient triggered events corresponding to rapid heart rate/palpitations/flutters correspond to sinus rhythm and narrow-complex tachycardia.   Predominantly sinus rhythm with episodes of narrow-complex tachycardia suggestive of paroxysmal supraventricular tachycardia.   10/14/2002: TTE SUMMARY   -  Overall left ventricular systolic function was normal. Left         ventricular ejection fraction was estimated , range being 55         % to 65 %. There were no left ventricular regional wall         motion abnormalities.   -  Mean transmitral gradient was 3.1 mmHg. Mitral valve area by         pressure half-time was 3.57 cm^2.   -  Left atrial size was at the upper limits of normal.   IMPRESSIONS   -  No likely source of an audible heart murmur was apparent.   Recent Labs: 12/22/2021: Hemoglobin 12.4; Platelets 255 03/07/2022: BUN 14; Creatinine, Ser 0.64; Potassium 4.1; Sodium 143  03/07/2022: Chol/HDL Ratio 2.0; Cholesterol, Total 212; HDL 108; LDL Chol Calc (NIH) 92; Triglycerides 66   Estimated Creatinine Clearance: 59.7 mL/min (by C-G formula based on SCr of 0.64 mg/dL).   Wt Readings from Last 3 Encounters:  03/07/22 131 lb 6.4 oz (59.6  kg)  12/22/21 130 lb 9.6 oz (59.2 kg)  12/02/21 126 lb (57.2 kg)     Other studies reviewed: Additional studies/records reviewed today include: summarized above  ASSESSMENT AND PLAN:  1. SVT     *** No change in her palpitations behavior     ***  2. HTN     *** Looks OK, no changes today    Disposition: ***    Current medicines are reviewed at length with the patient today.  The patient did not have any concerns regarding medicines.  Venetia Night, PA-C 03/19/2022 12:56 PM     [address] [place] [address] [phone] (office)  [phone] (fax)

## 2022-03-20 DIAGNOSIS — D649 Anemia, unspecified: Secondary | ICD-10-CM | POA: Insufficient documentation

## 2022-03-20 DIAGNOSIS — Z9049 Acquired absence of other specified parts of digestive tract: Secondary | ICD-10-CM | POA: Insufficient documentation

## 2022-03-20 DIAGNOSIS — G709 Myoneural disorder, unspecified: Secondary | ICD-10-CM | POA: Insufficient documentation

## 2022-03-20 DIAGNOSIS — Z98891 History of uterine scar from previous surgery: Secondary | ICD-10-CM

## 2022-03-20 DIAGNOSIS — M1990 Unspecified osteoarthritis, unspecified site: Secondary | ICD-10-CM | POA: Insufficient documentation

## 2022-03-20 HISTORY — DX: History of uterine scar from previous surgery: Z98.891

## 2022-03-21 ENCOUNTER — Ambulatory Visit: Payer: Medicare Other | Admitting: Physician Assistant

## 2022-03-24 ENCOUNTER — Ambulatory Visit: Payer: Medicare Other | Attending: Physician Assistant | Admitting: Physician Assistant

## 2022-03-24 ENCOUNTER — Encounter: Payer: Self-pay | Admitting: Physician Assistant

## 2022-03-24 VITALS — BP 138/64 | HR 83 | Ht 64.0 in | Wt 135.6 lb

## 2022-03-24 DIAGNOSIS — I471 Supraventricular tachycardia: Secondary | ICD-10-CM | POA: Insufficient documentation

## 2022-03-24 DIAGNOSIS — I10 Essential (primary) hypertension: Secondary | ICD-10-CM | POA: Insufficient documentation

## 2022-03-24 MED ORDER — DILTIAZEM HCL 30 MG PO TABS: ORAL_TABLET | ORAL | 3 refills | Status: DC

## 2022-03-24 NOTE — Patient Instructions (Signed)
Medication Instructions:   Your physician recommends that you continue on your current medications as directed. Please refer to the Current Medication list given to you today.  *If you need a refill on your cardiac medications before your next appointment, please call your pharmacy*   Lab Work:  NONE ORDERED  TODAY   If you have labs (blood work) drawn today and your tests are completely normal, you will receive your results only by: MyChart Message (if you have MyChart) OR A paper copy in the mail If you have any lab test that is abnormal or we need to change your treatment, we will call you to review the results.   Testing/Procedures: NONE ORDERED  TODAY     Follow-Up: At [facility], you and your health needs are our priority.  As part of our continuing mission to provide you with exceptional heart care, we have created designated Provider Care Teams.  These Care Teams include your primary Cardiologist (physician) and Advanced Practice Providers (APPs -  Physician Assistants and Nurse Practitioners) who all work together to provide you with the care you need, when you need it.  We recommend signing up for the patient portal called "MyChart".  Sign up information is provided on this After Visit Summary.  MyChart is used to connect with patients for Virtual Visits (Telemedicine).  Patients are able to view lab/test results, encounter notes, upcoming appointments, etc.  Non-urgent messages can be sent to your provider as well.   To learn more about what you can do with MyChart, go to [email or web address].    Your next appointment:   1 year(s)  The format for your next appointment:   In Person  Provider:   Will Camnitz, MD    Other Instructions   Important Information About Sugar       

## 2022-03-24 NOTE — Progress Notes (Signed)
Cardiology Office Note Date:  03/24/2022  Patient ID:  Alicia, [name] 1956-04-17, MRN [id number] PCP:  Holley Bouche, MD  Cardiologist:  Dr. Saunders Revel Electrophysiologist: Dr. Curt Bears   Today's visit is held today using sign language interpretor  Juliann Pulse w/[facility] services    Chief Complaint:  annual visit  History of Present Illness: Alicia [name] is a 66 y.o. female with history of HTN, HLD, SVT, DM, deafness, OSA (she denies this as an ongoing diagnosis for her, has never been told she needed a CPAP),   Historically CP felt to be atypical with neg stress testing  She comes in today to be seen for Dr. Curt Bears.  Referred to him Nov 2020 for palpitations and SVT noted on a monitor.  At that time was still waiting to receive CPAP for her sleep apnea.  She was still having palpitations despite coreg.  Discussed ablation though the pt preferred ongoing medical management first and daily dilt was added.  She had an ER visit in Nov 2020 with CP, nausea, felt to be GI ultimately and her prilosec increased. HS Trop 3 > 4  I saw her Feb 2021 She feels well from a cardiac perspective, no CP, palpitations.  She is limited by b/l knee and back pain, and has been struggling with GI issues.   She has days that she gets so bloated , is very uncomfortable, her clothes are tight, associated with nausea and sometimes vomiting as well.  Mostly triggers by eating, and can be improved with Mylanta, sometimes a laxative will help with BM. She was to have been referred to GI via her PMD office though has been a few weeks and not hear back yet.  She is due to see her again in a week or two.  Says at their last visit her knees were hurting her so bad that they concentrated on that. No palpitations, tachycardias, no dizzy spells, no near syncope or syncope. She has not yet been able to get around to following up on her CPAP machine.  Says she just has som much to take care of, work, home, so on. No changes were  made Encouraged to f/u on GI referral  I saw her May 2022 She is struggling with her DM especially Has had many changes in her meds in the last 3 months and starting to get back on track Follows this with her PMD and their pharmacists S he has had bloating but thinks that is her Linzess. Infrequent palpitations and rarely uses the PRN diltiazem She will sometimes feel like her HR is fast though this usually coorelates with very high or low sugars No CP, no near syncope or syncope She does not have any cardiac concerns today No changes were made, planned for annual visits and PRN  TODAY She is doing quite well, especially after she healed up after her neck surgery. She is much more active, has lost some weight Also has retired with has allowed her more time to exercise as well. No CP, SOB She will infrequently have a fleeting flutter in her heart beat, thinks that is more to do with her blood sugar. No near syncope or syncope. No use of her prn dilt  Past Medical History:  Diagnosis Date   Anemia    Arthritis    back and knees   ARTHRITIS, KNEE 09/17/2007   Asthma 04/04/2010   pt states she does not have asthma   Cataract 01/07/2019   Closed fracture  of distal end of left radius 11/01/2015   Complete deafness    Meningitis at age 15   Deaf    Diabetes mellitus    Diabetic neuropathy ([place])    Ganglion cyst 06/22/2011   Gastroparesis    GERD (gastroesophageal reflux disease)    H/O: C-section    Hyperlipidemia    Hypertension    Neuromuscular disorder ([place])    diabetic neuropathy   PSVT (paroxysmal supraventricular tachycardia) ([place]) 11/09/2017   Event monitor 09/14/2017 - Predominantly sinus rhythm with episodes of narrow-complex tachycardia suggestive of paroxysmal supraventricular tachycardia.   S/P appy     Past Surgical History:  Procedure Laterality Date   APPENDECTOMY     cardiolyte EF 77%, no ischemia in 2006  2006   [place] Right 04/20/2015    Procedure: RIGHT CARPAL TUNNEL RELEASE;  Surgeon: Daryll Brod, MD;  Location: [place];  Service: Orthopedics;  Laterality: Right;   CARPAL TUNNEL RELEASE Left 11/04/2015   Procedure: CARPAL TUNNEL RELEASE;  Surgeon: Daryll Brod, MD;  Location: [place];  Service: Orthopedics;  Laterality: Left;   CESAREAN SECTION     OPEN REDUCTION INTERNAL FIXATION (ORIF) DISTAL RADIAL FRACTURE Left 11/04/2015   Procedure: OPEN REDUCTION INTERNAL FIXATION (ORIF) LEFT DISTAL RADIAL FRACTURE POSSIBLE BONE GRAFT;  Surgeon: Daryll Brod, MD;  Location: [place];  Service: Orthopedics;  Laterality: Left;   POSTERIOR CERVICAL FUSION/FORAMINOTOMY N/A 12/26/2021   Procedure: Posterior cervical fusion with lateral mass fixation - Cervical Three-Cervical Six, Cervical Laminectomy Cervical Three-Cervica; Five;  Surgeon: Eustace Moore, MD;  Location: [place];  Service: Neurosurgery;  Laterality: N/A;   TRIGGER FINGER RELEASE Right 04/20/2015   Procedure: RELEASE TRIGGER FINGER/A-1 PULLEY RIGHT MIDDLE FINGER,RIGHT RING FINGER;  Surgeon: Daryll Brod, MD;  Location: [place];  Service: Orthopedics;  Laterality: Right;   TUBAL LIGATION     ULNAR NERVE TRANSPOSITION Right 04/20/2015   Procedure: RIGHT ULNAR NERVE DECOMPRESSION;  Surgeon: Daryll Brod, MD;  Location: [place];  Service: Orthopedics;  Laterality: Right;   UTERINE FIBROID EMBOLIZATION  2007   WRIST SURGERY     Cyst removed on left   WRIST SURGERY Right 1985   tendon repair R wrist    Current Outpatient Medications  Medication Sig Dispense Refill   ACCU-CHEK AVIVA PLUS test strip USE TO CHECK BLOOD GLUCOSE LEVELS 3X PER DAY. DX CODE: E11.8 100 strip 2   ACCU-CHEK SOFTCLIX LANCETS lancets TEST 4 TIMES A DAY 200 each 0   acetaminophen (TYLENOL) 650 MG CR tablet Take 2 tablets (1,300 mg total) by mouth every 8 (eight) hours as needed for pain. 120 tablet 3   aspirin 81 MG chewable tablet Chew  81 mg by mouth daily.     carvedilol (COREG) 25 MG tablet TAKE 1 TABLET BY MOUTH TWICE A DAY. 30 tablet 0   cholecalciferol (VITAMIN D) 1000 UNITS tablet Take 1,000 Units by mouth daily.     ciclopirox (PENLAC) 8 % solution Apply topically at bedtime. Apply over nail and surrounding skin. Apply daily over previous coat. After seven (7) days, may remove with alcohol and continue cycle. (Patient not taking: Reported on 12/16/2021) 6.6 mL 2   Cyanocobalamin (VITAMIN B 12) 500 MCG TABS Take 500 mcg by mouth daily at 6 (six) AM.     diclofenac Sodium (VOLTAREN) 1 % GEL Apply 2 g topically 4 (four) times daily. Rub into affected area of foot 2 to 4  times daily (Patient taking differently: Apply 2 g topically 4 (four) times daily as needed (pain). Rub into affected area of foot 2 to 4 times daily) 100 g 2   diltiazem (CARDIZEM CD) 240 MG 24 hr capsule TAKE 1 CAPSULE BY MOUTH EVERY DAY 90 capsule 1   diltiazem (CARDIZEM) 30 MG tablet TAKE 1 TABLET BY MOUTH AS NEEDED (HEART RACING OR PALPITATIONS THAT LAST LONGER THAN 15 MINS). 90 tablet 3   DULoxetine (CYMBALTA) 60 MG capsule TAKE 1 CAPSULE BY MOUTH EVERY DAY 90 capsule 3   Efinaconazole 10 % SOLN Apply 1 drop topically daily. (Patient not taking: Reported on 12/16/2021) 4 mL 11   famotidine (PEPCID) 20 MG tablet TAKE 1 TABLET BY MOUTH TWICE A DAY 180 tablet 1   ferrous sulfate 325 (65 FE) MG tablet Take 325 mg by mouth daily with breakfast.     fluticasone (FLONASE) 50 MCG/ACT nasal spray 2 puffs each nostril daily 48 mL 1   gabapentin (NEURONTIN) 800 MG tablet TAKE 1 TABLET BY MOUTH TWICE A DAY AS NEEDED 60 tablet 2   hydrochlorothiazide (MICROZIDE) 12.5 MG capsule TAKE 1 CAPSULE BY MOUTH EVERY DAY 90 capsule 1   hydrocortisone (ANUSOL-HC) 2.5 % rectal cream Place 1 application rectally 2 (two) times daily. (Patient not taking: Reported on 12/16/2021) 30 g 0   Insulin Glargine (BASAGLAR KWIKPEN) 100 UNIT/ML Inject 10 Units into the skin daily. 3 mL 5    Insulin Pen Needle 31G X 5 MM MISC 1 Container by Does not apply route once as needed for up to 1 dose. 100 each 11   LINZESS 72 MCG capsule Take 72 mcg by mouth every morning.     liraglutide (VICTOZA) 18 MG/3ML SOPN Inject 1.8 mg into the skin once a week. 3 mL 4   meloxicam (MOBIC) 15 MG tablet TAKE 1 TABLET BY MOUTH EVERY DAY 60 tablet 1   metFORMIN (GLUCOPHAGE) 1000 MG tablet TAKE 1 TABLET BY MOUTH TWICE A DAY 180 tablet 1   naproxen (NAPROSYN) 500 MG tablet Take 1 tablet (500 mg total) by mouth 2 (two) times daily with a meal. (Patient taking differently: Take 500 mg by mouth 2 (two) times daily as needed for moderate pain.) 30 tablet 0   NOVOLOG FLEXPEN 100 UNIT/ML FlexPen INJECT 2-4 UNITS INTO THE SKIN 3 (THREE) TIMES DAILY WITH MEALS. 3 mL 5   Olopatadine HCl 0.2 % SOLN PLACE 1 DROP IN BOTH EYES DAILY     pantoprazole (PROTONIX) 40 MG tablet Take 40 mg by mouth every morning.     rosuvastatin (CRESTOR) 5 MG tablet TAKE 1 TABLET (5 MG TOTAL) BY MOUTH 2 (TWO) TIMES A WEEK. 8 tablet 0   valsartan (DIOVAN) 160 MG tablet TAKE 1 TABLET BY MOUTH EVERYDAY AT BEDTIME 90 tablet 1   No current facility-administered medications for this visit.    Allergies:   Sulfonamide derivatives, Lipitor [atorvastatin calcium], Ramipril, and Trulicity [dulaglutide]   Social History:  The patient  reports that she has never smoked. She has never used smokeless tobacco. She reports current alcohol use of about 3.0 standard drinks of alcohol per week. She reports that she does not use drugs.   Family History:  The patient's family history includes Cancer in her maternal aunt and maternal grandmother; Diabetes in her father; Heart attack (age of onset: 3) in her mother; Hypertension in her mother.  ROS:  Please see the history of present illness.  All other systems are reviewed and  otherwise negative.   PHYSICAL EXAM:  VS:  There were no vitals taken for this visit. BMI: There is no height or weight on file to  calculate BMI. Well nourished, well developed, in no acute distress  HEENT: normocephalic, atraumatic  Neck: no JVD, carotid bruits or masses Cardiac: RRR; no significant murmurs, no rubs, or gallops Lungs:  CTA b/l, no wheezing, rhonchi or rales  Abd: soft, nontender, obese MS: no deformity or atrophy Ext: no edema  Skin: warm and dry, no rash Neuro:  No gross deficits appreciated Psych: euthymic mood, full affect    EKG:  not done today   Lexiscan Myoview 09/14/2017: Nuclear stress EF: 61%. The study is normal. The left ventricular ejection fraction is normal (55-65%).   1. EF 61%, normal wall motion.  2. No evidence for ischemia or infarction by perfusion images.     Normal study.  _______________   Cardiac Monitor  09/14/2017 to 09/27/2017: The patient was monitored for 14 days. The predominant rhythm was sinus; average heart rate during triggered events was 94 bpm. Two episodes of narrow complex tachycardia were observed, suggestive of SVT. No ventricular arrhythmias or prolonged pauses were seen. Patient triggered events corresponding to rapid heart rate/palpitations/flutters correspond to sinus rhythm and narrow-complex tachycardia.   Predominantly sinus rhythm with episodes of narrow-complex tachycardia suggestive of paroxysmal supraventricular tachycardia.   10/14/2002: TTE SUMMARY   -  Overall left ventricular systolic function was normal. Left         ventricular ejection fraction was estimated , range being 55         % to 65 %. There were no left ventricular regional wall         motion abnormalities.   -  Mean transmitral gradient was 3.1 mmHg. Mitral valve area by         pressure half-time was 3.57 cm^2.   -  Left atrial size was at the upper limits of normal.   IMPRESSIONS   -  No likely source of an audible heart murmur was apparent.   Recent Labs: 12/22/2021: Hemoglobin 12.4; Platelets 255 03/07/2022: BUN 14; Creatinine, Ser 0.64; Potassium 4.1;  Sodium 143  03/07/2022: Chol/HDL Ratio 2.0; Cholesterol, Total 212; HDL 108; LDL Chol Calc (NIH) 92; Triglycerides 66   CrCl cannot be calculated (Unknown ideal weight.).   Wt Readings from Last 3 Encounters:  03/07/22 131 lb 6.4 oz (59.6 kg)  12/22/21 130 lb 9.6 oz (59.2 kg)  12/02/21 126 lb (57.2 kg)     Other studies reviewed: Additional studies/records reviewed today include: summarized above  ASSESSMENT AND PLAN:  1. SVT     No change in her palpitations behavior, thinks the flutters she has is more to do with high/low blood sugars  2. HTN     Looks OK, no changes today    Disposition: we will continue to see her annually, sooner if needed.  Will see Dr. Curt Bears next year.    Current medicines are reviewed at length with the patient today.  The patient did not have any concerns regarding medicines.  Venetia Night, PA-C 03/24/2022 3:51 AM     [facility] [address] [place] [address] [place]  [phone] (office)  [phone] (fax)

## 2022-03-24 NOTE — Addendum Note (Signed)
Addended by: Claude Manges on: 03/24/2022 01:12 PM   Modules accepted: Orders

## 2022-03-28 ENCOUNTER — Other Ambulatory Visit (INDEPENDENT_AMBULATORY_CARE_PROVIDER_SITE_OTHER): Payer: Medicare Other | Admitting: Student

## 2022-03-28 DIAGNOSIS — Z794 Long term (current) use of insulin: Secondary | ICD-10-CM

## 2022-03-28 DIAGNOSIS — E119 Type 2 diabetes mellitus without complications: Secondary | ICD-10-CM | POA: Diagnosis not present

## 2022-03-29 ENCOUNTER — Other Ambulatory Visit: Payer: Self-pay | Admitting: Cardiology

## 2022-03-30 ENCOUNTER — Ambulatory Visit: Payer: Medicare Other | Admitting: Internal Medicine

## 2022-03-30 ENCOUNTER — Other Ambulatory Visit: Payer: Self-pay

## 2022-03-30 DIAGNOSIS — Z794 Long term (current) use of insulin: Secondary | ICD-10-CM

## 2022-03-30 MED ORDER — BASAGLAR KWIKPEN 100 UNIT/ML ~~LOC~~ SOPN: 10.0000 [IU] | PEN_INJECTOR | Freq: Every day | SUBCUTANEOUS | 5 refills | Status: DC

## 2022-03-30 NOTE — Progress Notes (Deleted)
Follow-up Outpatient Visit Date: 03/30/2022  Primary Care Provider: Holley Bouche, MD [place] [address]  Chief Complaint: ***  HPI:  Alicia [name] is a 66 y.o. female with history of SVT, hypertension, hyperlipidemia, type 2 diabetes mellitus, obstructive sleep apnea, and deafness, who presents for follow-up of SVT.  I last saw her in 08/2017 for follow-up of chest pain and palpitations.  Event monitor showed PSVT, prompting referral to EP.  EP study/ablation was discussed, though Alicia [name] did not wish to move forward with this.  She was managed with carvedilol and diltiazem with improvement in symptoms.  --------------------------------------------------------------------------------------------------  Past Medical History:  Diagnosis Date   Anemia    Arthritis    back and knees   ARTHRITIS, KNEE 09/17/2007   Asthma 04/04/2010   pt states she does not have asthma   Cataract 01/07/2019   Closed fracture of distal [place] of left radius 11/01/2015   Complete deafness    Meningitis at age 42   Deaf    Diabetes mellitus    Diabetic neuropathy ([place])    Ganglion cyst 06/22/2011   Gastroparesis    GERD (gastroesophageal reflux disease)    H/O: C-section    Hyperlipidemia    Hypertension    Neuromuscular disorder ([place])    diabetic neuropathy   PSVT (paroxysmal supraventricular tachycardia) ([place]) 11/09/2017   Event monitor 09/14/2017 - Predominantly sinus rhythm with episodes of narrow-complex tachycardia suggestive of paroxysmal supraventricular tachycardia.   S/P appy    Past Surgical History:  Procedure Laterality Date   APPENDECTOMY     cardiolyte EF 77%, no ischemia in 2006  2006   [place] Right 04/20/2015   Procedure: RIGHT CARPAL TUNNEL RELEASE;  Surgeon: Daryll Brod, MD;  Location: [place];  Service: Orthopedics;  Laterality: Right;   CARPAL TUNNEL RELEASE Left 11/04/2015   Procedure: CARPAL TUNNEL RELEASE;  Surgeon: Daryll Brod, MD;  Location: [place];  Service: Orthopedics;  Laterality: Left;   CESAREAN SECTION     OPEN REDUCTION INTERNAL FIXATION (ORIF) DISTAL RADIAL FRACTURE Left 11/04/2015   Procedure: OPEN REDUCTION INTERNAL FIXATION (ORIF) LEFT DISTAL RADIAL FRACTURE POSSIBLE BONE GRAFT;  Surgeon: Daryll Brod, MD;  Location: [place];  Service: Orthopedics;  Laterality: Left;   POSTERIOR CERVICAL FUSION/FORAMINOTOMY N/A 12/26/2021   Procedure: Posterior cervical fusion with lateral mass fixation - Cervical Three-Cervical Six, Cervical Laminectomy Cervical Three-Cervica; Five;  Surgeon: Eustace Moore, MD;  Location: [facility];  Service: Neurosurgery;  Laterality: N/A;   TRIGGER FINGER RELEASE Right 04/20/2015   Procedure: RELEASE TRIGGER FINGER/A-1 PULLEY RIGHT MIDDLE FINGER,RIGHT RING FINGER;  Surgeon: Daryll Brod, MD;  Location: [place];  Service: Orthopedics;  Laterality: Right;   TUBAL LIGATION     ULNAR NERVE TRANSPOSITION Right 04/20/2015   Procedure: RIGHT ULNAR NERVE DECOMPRESSION;  Surgeon: Daryll Brod, MD;  Location: [place];  Service: Orthopedics;  Laterality: Right;   UTERINE FIBROID EMBOLIZATION  2007   WRIST SURGERY     Cyst removed on left   WRIST SURGERY Right 1985   tendon repair R wrist     Recent CV Pertinent Labs: Lab Results  Component Value Date   CHOL 212 (H) 03/07/2022   HDL 108 03/07/2022   LDLCALC 92 03/07/2022   LDLDIRECT 78 07/10/2013   TRIG 66 03/07/2022   CHOLHDL 2.0 03/07/2022   CHOLHDL 2.3 07/28/2016   INR 0.9 12/22/2021   K 4.1  03/07/2022   MG 1.4 (L) 11/09/2011   BUN 14 03/07/2022   CREATININE 0.64 03/07/2022   CREATININE 0.75 07/28/2016    Past medical and surgical history were reviewed and updated in EPIC.  No outpatient medications have been marked as taking for the 03/30/22 encounter (Appointment) with Saquoia Sianez, Alicia Gave, MD.    Allergies: Sulfonamide derivatives, Lipitor [atorvastatin  calcium], Ramipril, and Trulicity [dulaglutide]  Social History   Tobacco Use   Smoking status: Never   Smokeless tobacco: Never  Vaping Use   Vaping Use: Never used  Substance Use Topics   Alcohol use: Yes    Alcohol/week: 3.0 standard drinks of alcohol    Types: 3 Glasses of wine per week    Comment: 1-2 wine occasional, not every week   Drug use: Never    Family History  Problem Relation Age of Onset   Hypertension Mother    Heart attack Mother 1   Diabetes Father    Cancer Maternal Aunt    Cancer Maternal Grandmother    Neuropathy Neg Hx    Breast cancer Neg Hx     Review of Systems: A 12-system review of systems was performed and was negative except as noted in the HPI.  --------------------------------------------------------------------------------------------------  Physical Exam: There were no vitals taken for this visit.  General:  NAD. Neck: No JVD or HJR. Lungs: Clear to auscultation bilaterally without wheezes or crackles. Heart: Regular rate and rhythm without murmurs, rubs, or gallops. Abdomen: Soft, nontender, nondistended. Extremities: No lower extremity edema.  EKG:  ***  Lab Results  Component Value Date   WBC 6.8 12/22/2021   HGB 12.4 12/22/2021   HCT 38.2 12/22/2021   MCV 93.6 12/22/2021   PLT 255 12/22/2021    Lab Results  Component Value Date   NA 143 03/07/2022   K 4.1 03/07/2022   CL 101 03/07/2022   CO2 25 03/07/2022   BUN 14 03/07/2022   CREATININE 0.64 03/07/2022   GLUCOSE 285 (H) 03/07/2022   ALT 14 07/09/2019    Lab Results  Component Value Date   CHOL 212 (H) 03/07/2022   HDL 108 03/07/2022   LDLCALC 92 03/07/2022   LDLDIRECT 78 07/10/2013   TRIG 66 03/07/2022   CHOLHDL 2.0 03/07/2022    --------------------------------------------------------------------------------------------------  ASSESSMENT AND PLAN: Alicia Gave Shalimar Mcclain, MD 03/30/2022 7:43 AM

## 2022-03-31 ENCOUNTER — Other Ambulatory Visit (INDEPENDENT_AMBULATORY_CARE_PROVIDER_SITE_OTHER): Payer: Medicare Other

## 2022-03-31 LAB — POCT GLYCOSYLATED HEMOGLOBIN (HGB A1C): HbA1c, POC (controlled diabetic range): 7.2 % — AB (ref 0.0–7.0)

## 2022-04-03 ENCOUNTER — Other Ambulatory Visit: Payer: Self-pay | Admitting: Cardiology

## 2022-04-05 ENCOUNTER — Other Ambulatory Visit: Payer: Self-pay | Admitting: Urology

## 2022-04-05 ENCOUNTER — Telehealth: Payer: Self-pay | Admitting: Student

## 2022-04-05 ENCOUNTER — Other Ambulatory Visit: Payer: Self-pay | Admitting: Student

## 2022-04-05 DIAGNOSIS — E119 Type 2 diabetes mellitus without complications: Secondary | ICD-10-CM

## 2022-04-05 DIAGNOSIS — N281 Cyst of kidney, acquired: Secondary | ICD-10-CM

## 2022-04-05 MED ORDER — BASAGLAR KWIKPEN 100 UNIT/ML ~~LOC~~ SOPN: 12.0000 [IU] | PEN_INJECTOR | Freq: Every day | SUBCUTANEOUS | 5 refills | Status: DC

## 2022-04-05 NOTE — Telephone Encounter (Signed)
Called to let patient know that her Hemoglobin A1c has improved to 7.2! This is almost at goal, we ideally want it below 7.0. Because we are nearly at goal, we will go up on glargine to 12 units, from the previous 10 units.   I'd like her to make an appointment for follow up in 2 months to recheck her A1C.  If she has any questions or concerns she can reach me through [place], or set up an appointment if she needs to be seen.

## 2022-04-05 NOTE — Progress Notes (Signed)
Changed dose of glargine to 12 units from 10 units

## 2022-04-06 ENCOUNTER — Ambulatory Visit (INDEPENDENT_AMBULATORY_CARE_PROVIDER_SITE_OTHER): Payer: Medicare Other

## 2022-04-06 ENCOUNTER — Ambulatory Visit (INDEPENDENT_AMBULATORY_CARE_PROVIDER_SITE_OTHER): Payer: Medicare Other | Admitting: Podiatry

## 2022-04-06 DIAGNOSIS — S92515A Nondisplaced fracture of proximal phalanx of left lesser toe(s), initial encounter for closed fracture: Secondary | ICD-10-CM | POA: Diagnosis not present

## 2022-04-06 DIAGNOSIS — S92515D Nondisplaced fracture of proximal phalanx of left lesser toe(s), subsequent encounter for fracture with routine healing: Secondary | ICD-10-CM

## 2022-04-07 DIAGNOSIS — Z23 Encounter for immunization: Secondary | ICD-10-CM | POA: Diagnosis not present

## 2022-04-08 NOTE — Progress Notes (Signed)
Subjective: 66 year old female presents the office today for follow-up evaluation of left fourth toe fracture.  She states that she is doing well she is having any issues.  No pain.  She forgot that she broke her toe at times.  She is wearing regular shoes.   Last A1c was 7.2 on September 29 200 history  Objective: AAO x3, NAD-presents with interpreter DP/PT pulses palpable bilaterally, CRT less than 3 seconds There is no pain on exam to the fourth toe there is no edema, erythema.  No other areas of tenderness. No pain with calf compression, swelling, warmth, erythema  Assessment: Symptomatic onychomycosis; left fourth toe fracture  Plan: -All treatment options discussed with the patient including all alternatives, risks, complications.  -X-rays obtained reviewed.  3 views left foot were obtained.  Increased consolidation noted across the fracture -Continue regular shoe gear as tolerated activity as tolerated. -Daily foot inspection  Trula Slade DPM

## 2022-04-10 ENCOUNTER — Telehealth: Payer: Self-pay

## 2022-04-10 NOTE — Telephone Encounter (Signed)
Patient calls nurse line in regards to Victoza.   Patient reports she went to pick this up, however the medication was ~1,000 dollars.   I called two separate pharmacies ([facility] and [facility] They both reported the medication was not covered. Unable to give me anymore information.   This is not a new medication for patient.   Will forward to [place] for assistance.

## 2022-04-11 ENCOUNTER — Other Ambulatory Visit (HOSPITAL_COMMUNITY): Payer: Self-pay

## 2022-04-11 ENCOUNTER — Other Ambulatory Visit: Payer: Self-pay | Admitting: *Deleted

## 2022-04-11 MED ORDER — VICTOZA 18 MG/3ML ~~LOC~~ SOPN: 1.8000 mg | PEN_INJECTOR | SUBCUTANEOUS | 4 refills | Status: DC

## 2022-04-11 NOTE — Telephone Encounter (Signed)
[facility] was running incorrect insurance. Medication is covered and has to be ordered for tomorrow.  Left message for pt with this info.

## 2022-04-16 ENCOUNTER — Other Ambulatory Visit: Payer: Self-pay | Admitting: Family Medicine

## 2022-04-16 DIAGNOSIS — E118 Type 2 diabetes mellitus with unspecified complications: Secondary | ICD-10-CM

## 2022-04-18 NOTE — Telephone Encounter (Signed)
Patient returns call to nurse line regarding still not being able to pick up Victoza. Patient prefers to [facility] at [facility] [address]. Pharmacist reports that they need to order medication and should be able to get in by tomorrow.   Returned call to patient. She did not answer, LVM informing her of this.   Talbot Grumbling, RN

## 2022-04-19 ENCOUNTER — Other Ambulatory Visit: Payer: Self-pay | Admitting: Student

## 2022-04-24 ENCOUNTER — Other Ambulatory Visit: Payer: Self-pay | Admitting: Family Medicine

## 2022-04-24 ENCOUNTER — Telehealth: Payer: Self-pay

## 2022-04-24 DIAGNOSIS — E118 Type 2 diabetes mellitus with unspecified complications: Secondary | ICD-10-CM

## 2022-04-24 MED ORDER — ACCU-CHEK AVIVA PLUS VI STRP: ORAL_STRIP | 2 refills | Status: DC

## 2022-04-24 NOTE — Progress Notes (Signed)
Office Visit    Patient Name: Alicia [name] Date of Encounter: 04/25/2022  PCP:  Holley Bouche, [place]  Cardiologist:  Nelva Bush, MD  Advanced Practice Provider:  No care team member to display Electrophysiologist:  None   HPI    Alicia [name] is a 66 y.o. female with a past medical history significant for hypertension, hyperlipidemia, SVT, DM, deafness, OSA (denied as an ongoing diagnosis, never been told she needed CPAP) presents today for overdue follow-up appointment.  Historically her chest pain is felt to be atypical with negative stress testing.  She was seen by Dr. Curt Bears November 2020 for palpitations and SVT noted on the monitor.  At that time she was still waiting to receive CPAP for sleep apnea.  She was having palpitations despite being on Coreg.  Discussed ablation though the patient preferred ongoing medical management first and daily diltiazem was added.  She was in the ER November 2020 with chest pain, nausea, felt to be GI ultimately and her Prilosec was increased.  Last seen 03/24/2022.  She was more active but had lost some weight.  She retired which allowed her to have more time to exercise.  No chest pain or shortness of breath.  Infrequently will have fluttering feeling in her heart beat.  No use of her prn dilt.  Interview performed with onsite sign language interpreter.  Today, she states that she is having some tightness on the right side.  It happened yesterday when she was folding close.  She sat down and it persisted for about 2 to 3 minutes.  She states that it happens about twice a year and is not associated with the fluttering she was feeling in her chest before.  She has experienced some fluttering in her chest but she takes her as needed diltiazem and this works well for her.  We discussed if the right-sided chest tightness occurs more frequently or last for longer to please let us know.  No EKG changes present  today that would be concerning for ischemia.  Her exercise has slowed down due to neck surgery but she is just starting to get back to her normal activity level.  Reports no shortness of breath nor dyspnea on exertion. No edema, orthopnea, PND. Reports no palpitations.    Past Medical History    Past Medical History:  Diagnosis Date   Anemia    Arthritis    back and knees   ARTHRITIS, KNEE 09/17/2007   Asthma 04/04/2010   pt states she does not have asthma   Cataract 01/07/2019   Closed fracture of distal end of left radius 11/01/2015   Complete deafness    Meningitis at age 19   Deaf    Diabetes mellitus    Diabetic neuropathy ([place])    Ganglion cyst 06/22/2011   Gastroparesis    GERD (gastroesophageal reflux disease)    H/O: C-section    Hyperlipidemia    Hypertension    Neuromuscular disorder ([place])    diabetic neuropathy   PSVT (paroxysmal supraventricular tachycardia) 11/09/2017   Event monitor 09/14/2017 - Predominantly sinus rhythm with episodes of narrow-complex tachycardia suggestive of paroxysmal supraventricular tachycardia.   S/P appy    Past Surgical History:  Procedure Laterality Date   APPENDECTOMY     cardiolyte EF 77%, no ischemia in 2006  2006   [place] Right 04/20/2015   Procedure: RIGHT CARPAL TUNNEL RELEASE;  Surgeon: Daryll Brod, MD;  Location: [facility];  Service: Orthopedics;  Laterality: Right;   CARPAL TUNNEL RELEASE Left 11/04/2015   Procedure: CARPAL TUNNEL RELEASE;  Surgeon: Daryll Brod, MD;  Location: [facility];  Service: Orthopedics;  Laterality: Left;   CESAREAN SECTION     OPEN REDUCTION INTERNAL FIXATION (ORIF) DISTAL RADIAL FRACTURE Left 11/04/2015   Procedure: OPEN REDUCTION INTERNAL FIXATION (ORIF) LEFT DISTAL RADIAL FRACTURE POSSIBLE BONE GRAFT;  Surgeon: Daryll Brod, MD;  Location: [facility];  Service: Orthopedics;  Laterality: Left;   POSTERIOR CERVICAL FUSION/FORAMINOTOMY N/A  12/26/2021   Procedure: Posterior cervical fusion with lateral mass fixation - Cervical Three-Cervical Six, Cervical Laminectomy Cervical Three-Cervica; Five;  Surgeon: Eustace Moore, MD;  Location: [facility];  Service: Neurosurgery;  Laterality: N/A;   TRIGGER FINGER RELEASE Right 04/20/2015   Procedure: RELEASE TRIGGER FINGER/A-1 PULLEY RIGHT MIDDLE FINGER,RIGHT RING FINGER;  Surgeon: Daryll Brod, MD;  Location: [facility];  Service: Orthopedics;  Laterality: Right;   TUBAL LIGATION     ULNAR NERVE TRANSPOSITION Right 04/20/2015   Procedure: RIGHT ULNAR NERVE DECOMPRESSION;  Surgeon: Daryll Brod, MD;  Location: [facility];  Service: Orthopedics;  Laterality: Right;   UTERINE FIBROID EMBOLIZATION  2007   WRIST SURGERY     Cyst removed on left   WRIST SURGERY Right 1985   tendon repair R wrist    Allergies  Allergies  Allergen Reactions   Sulfonamide Derivatives Swelling and Rash    REACTION: rash, swelling - "Lost BABY" - Terrible itching.    Lipitor [Atorvastatin Calcium] Other (See Comments)    Muscle Aches - Mild-Moderate - completely resolved with D/C of atorva.    Ramipril Other (See Comments)    REACTION: cough    Trulicity [Dulaglutide] Nausea And Vomiting    EKGs/Labs/Other Studies Reviewed:   The following studies were reviewed today:  Carotid ultrasound 12/22/2019  Summary:  Right Carotid: The extracranial vessels were near-normal with only minimal  wall                 thickening or plaque.   Left Carotid: The extracranial vessels were near-normal with only minimal  wall                thickening or plaque.   Vertebrals:  Bilateral vertebral arteries demonstrate antegrade flow.  Subclavians: Normal flow hemodynamics were seen in bilateral subclavian               arteries.  EKG:  EKG is  ordered today.  The ekg ordered today demonstrates normal sinus rhythm, rate 69 bpm  Recent Labs: 12/22/2021: Hemoglobin 12.4; Platelets  255 03/07/2022: BUN 14; Creatinine, Ser 0.64; Potassium 4.1; Sodium 143  Recent Lipid Panel    Component Value Date/Time   CHOL 212 (H) 03/07/2022 1216   TRIG 66 03/07/2022 1216   HDL 108 03/07/2022 1216   CHOLHDL 2.0 03/07/2022 1216   CHOLHDL 2.3 07/28/2016 0936   VLDL 12 07/28/2016 0936   LDLCALC 92 03/07/2022 1216   LDLDIRECT 78 07/10/2013 1441    Home Medications   Current Meds  Medication Sig   ACCU-CHEK SOFTCLIX LANCETS lancets TEST 4 TIMES A DAY   acetaminophen (TYLENOL) 650 MG CR tablet Take 2 tablets (1,300 mg total) by mouth every 8 (eight) hours as needed for pain.   aspirin 81 MG chewable tablet Chew 81 mg by mouth daily.   carvedilol (COREG) 25 MG tablet TAKE 1 TABLET BY MOUTH TWICE A  DAY.   cholecalciferol (VITAMIN D) 1000 UNITS tablet Take 1,000 Units by mouth daily.   ciclopirox (PENLAC) 8 % solution Apply topically at bedtime. Apply over nail and surrounding skin. Apply daily over previous coat. After seven (7) days, may remove with alcohol and continue cycle.   Cyanocobalamin (VITAMIN B 12) 500 MCG TABS Take 500 mcg by mouth daily at 6 (six) AM.   diclofenac Sodium (VOLTAREN) 1 % GEL Apply 2 g topically 4 (four) times daily. Rub into affected area of foot 2 to 4 times daily (Patient taking differently: Apply 2 g topically 4 (four) times daily as needed (pain). Rub into affected area of foot 2 to 4 times daily)   diltiazem (CARDIZEM CD) 240 MG 24 hr capsule TAKE 1 CAPSULE BY MOUTH EVERY DAY   diltiazem (CARDIZEM) 30 MG tablet TAKE 1 TABLET BY MOUTH AS NEEDED (HEART RACING OR PALPITATIONS THAT LAST LONGER THAN 15 MINS).   DULoxetine (CYMBALTA) 60 MG capsule TAKE 1 CAPSULE BY MOUTH EVERY DAY   Efinaconazole 10 % SOLN Apply 1 drop topically daily.   famotidine (PEPCID) 20 MG tablet TAKE 1 TABLET BY MOUTH TWICE A DAY   ferrous sulfate 325 (65 FE) MG tablet Take 325 mg by mouth daily with breakfast.   fluticasone (FLONASE) 50 MCG/ACT nasal spray 2 puffs each nostril daily    gabapentin (NEURONTIN) 800 MG tablet TAKE 1 TABLET BY MOUTH TWICE A DAY AS NEEDED   glucose blood (ACCU-CHEK AVIVA PLUS) test strip USE 1 NEW STRIP TO TEST BLOOD SUGAR 3 TIMES PER DAY. E11.9   hydrochlorothiazide (MICROZIDE) 12.5 MG capsule TAKE 1 CAPSULE BY MOUTH EVERY DAY   hydrocortisone (ANUSOL-HC) 2.5 % rectal cream Place 1 application rectally 2 (two) times daily.   Insulin Glargine (BASAGLAR KWIKPEN) 100 UNIT/ML Inject 12 Units into the skin daily.   Insulin Pen Needle 31G X 5 MM MISC 1 Container by Does not apply route once as needed for up to 1 dose.   LINZESS 72 MCG capsule Take 72 mcg by mouth every morning.   liraglutide (VICTOZA) 18 MG/3ML SOPN Inject 1.8 mg into the skin once a week.   meloxicam (MOBIC) 15 MG tablet TAKE 1 TABLET BY MOUTH EVERY DAY   metFORMIN (GLUCOPHAGE) 1000 MG tablet TAKE 1 TABLET BY MOUTH TWICE A DAY   NOVOLOG FLEXPEN 100 UNIT/ML FlexPen INJECT 2-4 UNITS INTO THE SKIN 3 (THREE) TIMES DAILY WITH MEALS.   Olopatadine HCl 0.2 % SOLN PLACE 1 DROP IN BOTH EYES DAILY   pantoprazole (PROTONIX) 40 MG tablet Take 40 mg by mouth every morning.   rosuvastatin (CRESTOR) 5 MG tablet TAKE 1 TABLET (5 MG TOTAL) BY MOUTH 2 TIMES A WEEK   valsartan (DIOVAN) 160 MG tablet TAKE 1 TABLET BY MOUTH EVERYDAY AT BEDTIME     Review of Systems      All other systems reviewed and are otherwise negative except as noted above.  Physical Exam    VS:  BP 130/80   Pulse 69   Ht '5\' 4"'$  (1.626 m)   Wt 136 lb 6.4 oz (61.9 kg)   SpO2 96%   BMI 23.41 kg/m  , BMI Body mass index is 23.41 kg/m.  Wt Readings from Last 3 Encounters:  04/25/22 136 lb 6.4 oz (61.9 kg)  03/24/22 135 lb 9.6 oz (61.5 kg)  03/07/22 131 lb 6.4 oz (59.6 kg)     GEN: Well nourished, well developed, in no acute distress. HEENT: normal. Neck: Supple, no  JVD, carotid bruits, or masses. Cardiac: RRR, no murmurs, rubs, or gallops. No clubbing, cyanosis, edema.  Radials/PT 2+ and equal bilaterally.   Respiratory:  Respirations regular and unlabored, clear to auscultation bilaterally. GI: Soft, nontender, nondistended. MS: No deformity or atrophy. Skin: Warm and dry, no rash. Neuro:  Strength and sensation are intact. Psych: Normal affect.  Assessment & Plan    Right-sided Chest tightness -Occurs infrequently and only last 2 to 3 minutes -If this starts to occur more often or last for longer please let us know -Low suspicion for ischemia and would defer ischemic work-up for now  SVT -When she has symptoms she takes an extra dose of her diltiazem and this works well -Continue current medication regimen  HTN -well controlled today -[place] blood pressure cuff recommended for at home monitoring  HLD -lipid panel when she next comes in for appointment -Continue current medications which include Crestor 5 mg twice a week.   5. Provider change -She would like to change providers due to location.  We will work on this today.        Disposition: Follow up 10 month with Nelva Bush, MD or APP.  Signed, Elgie Collard, PA-C 04/25/2022, 11:32 AM [facility]

## 2022-04-24 NOTE — Telephone Encounter (Signed)
Patient calls nurse line in regards to (2) prescriptions she has not been able to pick up. Victoza and Accu-Chek Strips.  I called the pharmacy and Victoza is on a nation wide back order.   Testing strips are not covered by PCP.   Will send in testing strips under this mornings preceptor.

## 2022-04-25 ENCOUNTER — Telehealth: Payer: Self-pay | Admitting: *Deleted

## 2022-04-25 ENCOUNTER — Ambulatory Visit: Payer: Medicare Other | Attending: Internal Medicine | Admitting: Physician Assistant

## 2022-04-25 ENCOUNTER — Encounter: Payer: Self-pay | Admitting: Physician Assistant

## 2022-04-25 VITALS — BP 130/80 | HR 69 | Ht 64.0 in | Wt 136.4 lb

## 2022-04-25 DIAGNOSIS — I10 Essential (primary) hypertension: Secondary | ICD-10-CM | POA: Insufficient documentation

## 2022-04-25 DIAGNOSIS — E785 Hyperlipidemia, unspecified: Secondary | ICD-10-CM | POA: Diagnosis not present

## 2022-04-25 DIAGNOSIS — R0789 Other chest pain: Secondary | ICD-10-CM | POA: Diagnosis not present

## 2022-04-25 DIAGNOSIS — I471 Supraventricular tachycardia, unspecified: Secondary | ICD-10-CM | POA: Insufficient documentation

## 2022-04-25 NOTE — Telephone Encounter (Signed)
Recall place for Dr Johney Frame.

## 2022-04-25 NOTE — Patient Instructions (Signed)
Medication Instructions:  Your physician recommends that you continue on your current medications as directed. Please refer to the Current Medication list given to you today.  *If you need a refill on your cardiac medications before your next appointment, please call your pharmacy*   Lab Work: Fasting lipid at follow up appointment in August 2024 If you have labs (blood work) drawn today and your tests are completely normal, you will receive your results only by: [place] (if you have MyChart) OR A paper copy in the mail If you have any lab test that is abnormal or we need to change your treatment, we will call you to review the results.   Follow-Up: At [facility], you and your health needs are our priority.  As part of our continuing mission to provide you with exceptional heart care, we have created designated Provider Care Teams.  These Care Teams include your primary Cardiologist (physician) and Advanced Practice Providers (APPs -  Physician Assistants and Nurse Practitioners) who all work together to provide you with the care you need, when you need it.   Your next appointment:   10 month(s) with fasting lipid panel  The format for your next appointment:   In Person  Other Instructions 1.If you have any more episodes of right sided chest pain call and let us know 2.Omron is our recommended brand of blood pressure cuff to use at home as discussed  Important Information About Sugar

## 2022-04-25 NOTE — Telephone Encounter (Signed)
Patient saw Johann Capers today and requested a provider/location switch as she lives in [place] and this office is closer for her than the [place] office. Please advise. Thanks, Jhovanny Guinta

## 2022-04-27 DIAGNOSIS — E103592 Type 1 diabetes mellitus with proliferative diabetic retinopathy without macular edema, left eye: Secondary | ICD-10-CM | POA: Diagnosis not present

## 2022-04-27 DIAGNOSIS — H35373 Puckering of macula, bilateral: Secondary | ICD-10-CM | POA: Diagnosis not present

## 2022-04-27 DIAGNOSIS — H43813 Vitreous degeneration, bilateral: Secondary | ICD-10-CM | POA: Diagnosis not present

## 2022-04-27 DIAGNOSIS — E103593 Type 1 diabetes mellitus with proliferative diabetic retinopathy without macular edema, bilateral: Secondary | ICD-10-CM | POA: Diagnosis not present

## 2022-04-27 DIAGNOSIS — H35363 Drusen (degenerative) of macula, bilateral: Secondary | ICD-10-CM | POA: Diagnosis not present

## 2022-04-28 ENCOUNTER — Other Ambulatory Visit: Payer: Self-pay | Admitting: Family Medicine

## 2022-04-28 ENCOUNTER — Other Ambulatory Visit: Payer: Self-pay | Admitting: Student

## 2022-04-28 DIAGNOSIS — Z1231 Encounter for screening mammogram for malignant neoplasm of breast: Secondary | ICD-10-CM

## 2022-05-03 DIAGNOSIS — K5730 Diverticulosis of large intestine without perforation or abscess without bleeding: Secondary | ICD-10-CM | POA: Diagnosis not present

## 2022-05-03 DIAGNOSIS — D509 Iron deficiency anemia, unspecified: Secondary | ICD-10-CM | POA: Diagnosis not present

## 2022-05-03 DIAGNOSIS — K648 Other hemorrhoids: Secondary | ICD-10-CM | POA: Diagnosis not present

## 2022-05-08 ENCOUNTER — Ambulatory Visit
Admission: RE | Admit: 2022-05-08 | Discharge: 2022-05-08 | Disposition: A | Discharge status: Home / Self Care | Payer: Medicare Other | Source: Ambulatory Visit | Attending: Urology | Admitting: Urology

## 2022-05-08 DIAGNOSIS — N281 Cyst of kidney, acquired: Secondary | ICD-10-CM | POA: Diagnosis not present

## 2022-05-08 MED ORDER — GADOPICLENOL 0.5 MMOL/ML IV SOLN
6.0000 mL | Freq: Once | INTRAVENOUS | Status: AC | PRN
  Administered 2022-05-08: 6 mL via INTRAVENOUS

## 2022-05-12 DIAGNOSIS — N393 Stress incontinence (female) (male): Secondary | ICD-10-CM | POA: Diagnosis not present

## 2022-05-12 DIAGNOSIS — N281 Cyst of kidney, acquired: Secondary | ICD-10-CM | POA: Diagnosis not present

## 2022-05-29 ENCOUNTER — Other Ambulatory Visit: Payer: Self-pay | Admitting: Family Medicine

## 2022-05-29 DIAGNOSIS — E119 Type 2 diabetes mellitus without complications: Secondary | ICD-10-CM

## 2022-05-30 NOTE — Telephone Encounter (Signed)
Unable to refill per protocol, last refill by another provider. Provider not at this practice, will refuse.  Requested Prescriptions  Pending Prescriptions Disp Refills   hydrochlorothiazide (MICROZIDE) 12.5 MG capsule [Pharmacy Med Name: HYDROCHLOROTHIAZIDE 12.5 MG CP] 90 capsule 1    Sig: TAKE 1 CAPSULE BY MOUTH EVERY DAY     There is no refill protocol information for this order     Cataract 18 MG/3ML SOPN [Pharmacy Med Name: VICTOZA 2-PAK 18 MG/3 ML PEN]  3    Sig: INJECT 1.2 MG INTO THE SKIN 1 DAY OR 1 DOSE FOR 120 DOSES.     There is no refill protocol information for this order

## 2022-06-02 ENCOUNTER — Telehealth: Payer: Self-pay

## 2022-06-02 NOTE — Telephone Encounter (Signed)
Patient calls nurse line in regards to Victoza.  She reports she has not had any in~2 months. I advised we sent in a prescription with additional refills on 04/11/2022. She reports the medication is not covered by her insurance.   I called the pharmacy. Pharmacist reports there is no stock of the medication. He reports they have told patient this multiple times. However, he does report the copay is ~62 dollars.   Patient has been scheduled with PCP for DM FU. She would like to discuss alteratives.

## 2022-06-02 NOTE — Telephone Encounter (Signed)
Thank you for all of your work! Will discuss with her at visit!

## 2022-06-09 ENCOUNTER — Other Ambulatory Visit (HOSPITAL_COMMUNITY): Payer: Self-pay

## 2022-06-09 ENCOUNTER — Encounter: Payer: Self-pay | Admitting: Student

## 2022-06-09 ENCOUNTER — Ambulatory Visit (INDEPENDENT_AMBULATORY_CARE_PROVIDER_SITE_OTHER): Payer: Medicare Other | Admitting: Student

## 2022-06-09 VITALS — BP 149/84 | HR 78 | Temp 98.1°F | Ht 63.0 in | Wt 140.6 lb

## 2022-06-09 DIAGNOSIS — Z794 Long term (current) use of insulin: Secondary | ICD-10-CM

## 2022-06-09 DIAGNOSIS — E11311 Type 2 diabetes mellitus with unspecified diabetic retinopathy with macular edema: Secondary | ICD-10-CM

## 2022-06-09 DIAGNOSIS — E1169 Type 2 diabetes mellitus with other specified complication: Secondary | ICD-10-CM

## 2022-06-09 LAB — GLUCOSE, POCT (MANUAL RESULT ENTRY)
POC Glucose: 44 mg/dl — AB (ref 70–99)
POC Glucose: 80 mg/dl (ref 70–99)

## 2022-06-09 MED ORDER — GLUCOSE 40 % PO GEL
1.0000 | Freq: Once | ORAL | Status: AC
  Administered 2022-06-09: 31 g via ORAL

## 2022-06-09 NOTE — Patient Instructions (Addendum)
It was great to see you! Thank you for allowing me to participate in your care!   Our plans for today:   Diabetes - Check Urine for Kidney function - Check A1c at end of month 29th or later - Send eye exam notes to clinic - Be sure to eat everyday, because insulin without food is making you hypoglycemic and is life threatening/dangerous - Talking to Pharmacy about medication coverage  We are checking some labs today, I will call you if they are abnormal will send you a MyChart message or a letter if they are normal.  If you do not hear about your labs in the next 2 weeks please let us know.  Take care and seek immediate care sooner if you develop any concerns.   Dr. Holley Bouche, MD [place]

## 2022-06-09 NOTE — Progress Notes (Signed)
SUBJECTIVE:   CHIEF COMPLAINT / HPI:   DM f/u Issues: Could not refill victoza, has been off for 2 months, and novolog won't be covered by insurance Lab Results  Component Value Date   HGBA1C 7.2 (A) 03/31/2022  CBG: Fasting around 125, CBG range is usually less than 200. Having hypoglycemia 3-4 times a month, when Alicia [name] forgets to eat. Reports drinking juice/eating crackers when hypoglycemia Meds:Glargine 12 units daily, Victoza 1.8 mg wk, Metformin 1000 mg BID, novolog 2-4 units w/ meals  Foot: Exam completed Urine:Will collect today Eye: Had eyes checked last month, and has f/u in 6 wk. File request started.   HTN: Meds: Valsartan 160 mg daily, HCTZ 12.5 mg daily, carvedilol 25 mg   PERTINENT  PMH / PSH: DM, HTN    OBJECTIVE:  BP (!) 149/84   Pulse 78   Temp 98.1 F (36.7 C)   Ht '5\' 3"'$  (1.6 m)   Wt 140 lb 9.6 oz (63.8 kg)   SpO2 99%   BMI 24.91 kg/m  Physical Exam Constitutional:      General: Alicia [name] is not in acute distress.    Appearance: Normal appearance. Alicia [name] is normal weight. Alicia [name] is not ill-appearing.  Cardiovascular:     Rate and Rhythm: Normal rate and regular rhythm.     Pulses: Normal pulses.     Heart sounds: Normal heart sounds. No murmur heard.    No friction rub. No gallop.  Pulmonary:     Effort: Pulmonary effort is normal. No respiratory distress.     Breath sounds: Normal breath sounds. No stridor. No wheezing, rhonchi or rales.  Abdominal:     General: Abdomen is flat. There is no distension.     Palpations: Abdomen is soft. There is no mass.     Tenderness: There is no abdominal tenderness. There is no guarding.     Hernia: No hernia is present.  Musculoskeletal:     Right foot: No deformity or bunion.     Left foot: No deformity or bunion.  Feet:     Right foot:     Protective Sensation: 8 sites tested.  8 sites sensed.     Skin integrity: Skin integrity normal.     Toenail Condition: Right toenails are normal.     Left foot:     Protective  Sensation: 8 sites tested.  8 sites sensed.     Skin integrity: Skin integrity normal.     Toenail Condition: Left toenails are normal.  Neurological:     Mental Status: Alicia [name] is alert.      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 Patient reports hasn't had victoza in 2 months as insurance won't cover/pharmacy won't fill, but is compliant with all other meds. Patient comes in w/ paperwork showing that Victoza is not covered, and Novolog will not be covered come January. Foot exam normal today. Patient to have A1c and [place] Cr/Alb checked at end of month. Patient had hypoglycemic event while  in [place] w/ CBG down to 40's, that returned to 80's after oral glucose. Dr. Valentina [name] saw patient, CGM monitor suggested by pharmacy. Pharmacy notes that patient should be able to refill victoza come January, and that novolog would also be available.  -A1c and Urine Cr/alb at end of the month -Consult pharm about DM meds -Continue Glargine 12 units daily, Victoz1 1.8 mg wk, Metformin 1000 mg BID, Novolog 2-4 units w/ meals -Continuous Glucose Monitor to be ordered by pharmacy    No follow-ups on file. Holley Bouche, MD 06/09/2022, 6:22 PM PGY-2, [place]

## 2022-06-09 NOTE — Assessment & Plan Note (Signed)
>>  ASSESSMENT AND PLAN FOR DIABETES (HCC) WRITTEN ON 06/09/2022  6:22 PM BY JENNELLE RIIS, MD  Patient f/u for DM check. Patient reports hasn't had victoza  in 2 months as insurance won't cover/pharmacy won't fill, but is compliant with all other meds. Patient comes in w/ paperwork showing that Victoza  is not covered, and Novolog  will not be covered come January. Foot exam normal today. Patient to have A1c and Ureine Cr/Alb checked at end of month. Patient had hypoglycemic event while in [place] w/ CBG down to 40's, that returned to 80's after oral glucose. Dr. Koval saw patient, CGM monitor suggested by pharmacy. Pharmacy notes that patient should be able to refill victoza  come January, and that novolog  would also be available.  -A1c and Urine Cr/alb at end of the month -Consult pharm about DM meds -Continue Glargine 12 units daily, Victoz1 1.8 mg wk, Metformin  1000 mg BID, Novolog  2-4 units w/ meals -Continuous Glucose Monitor to be ordered by pharmacy

## 2022-06-09 NOTE — Assessment & Plan Note (Addendum)
Patient f/u for DM check. Patient reports hasn't had victoza in 2 months as insurance won't cover/pharmacy won't fill, but is compliant with all other meds. Patient comes in w/ paperwork showing that Victoza is not covered, and Novolog will not be covered come January. Foot exam normal today. Patient to have A1c and Ureine Cr/Alb checked at end of month. Patient had hypoglycemic event while in [place] w/ CBG down to 40's, that returned to 80's after oral glucose. Dr. Valentina Lucks saw patient, CGM monitor suggested by pharmacy. Pharmacy notes that patient should be able to refill victoza come January, and that novolog would also be available.  -A1c and Urine Cr/alb at end of the month -Consult pharm about DM meds -Continue Glargine 12 units daily, Victoz1 1.8 mg wk, Metformin 1000 mg BID, Novolog 2-4 units w/ meals -Continuous Glucose Monitor to be ordered by pharmacy

## 2022-06-13 ENCOUNTER — Other Ambulatory Visit (HOSPITAL_COMMUNITY): Payer: Self-pay

## 2022-06-13 ENCOUNTER — Telehealth: Payer: Self-pay

## 2022-06-13 NOTE — Telephone Encounter (Signed)
Submitted order for [employer] to [employer] via [employer]

## 2022-06-19 ENCOUNTER — Other Ambulatory Visit: Payer: Self-pay | Admitting: Physician Assistant

## 2022-06-19 NOTE — Telephone Encounter (Signed)
Order cancelled.   Per Alicia [name] from [employer] 06/16/22:  THIRD ATTEMPT [facility]SR  Called patient to establish contact from new [facility] referral. No answer- UNBLE to leave a voicemail. Sent text/email   Canceled: Order for [employer], change every 14 days + 1 other item was canceled. Please contact the supplier if you wish to have this order fulfilled

## 2022-06-22 ENCOUNTER — Ambulatory Visit: Payer: Medicare Other

## 2022-06-23 ENCOUNTER — Ambulatory Visit
Admission: RE | Admit: 2022-06-23 | Discharge: 2022-06-23 | Disposition: A | Discharge status: Home / Self Care | Payer: Medicare Other | Source: Ambulatory Visit

## 2022-06-23 DIAGNOSIS — Z1231 Encounter for screening mammogram for malignant neoplasm of breast: Secondary | ICD-10-CM

## 2022-06-29 NOTE — Telephone Encounter (Signed)
Contact supplier info [phone]

## 2022-06-30 ENCOUNTER — Other Ambulatory Visit (INDEPENDENT_AMBULATORY_CARE_PROVIDER_SITE_OTHER): Payer: Medicare Other

## 2022-06-30 DIAGNOSIS — E11311 Type 2 diabetes mellitus with unspecified diabetic retinopathy with macular edema: Secondary | ICD-10-CM

## 2022-06-30 LAB — POCT GLYCOSYLATED HEMOGLOBIN (HGB A1C): HbA1c, POC (controlled diabetic range): 7.5 % — AB (ref 0.0–7.0)

## 2022-07-01 LAB — MICROALBUMIN / CREATININE URINE RATIO
Creatinine, Urine: 49 mg/dL
Microalb/Creat Ratio: 13 mg/g creat (ref 0–29)
Microalbumin, Urine: 6.3 ug/mL

## 2022-07-05 ENCOUNTER — Other Ambulatory Visit: Payer: Self-pay | Admitting: Family Medicine

## 2022-07-07 ENCOUNTER — Other Ambulatory Visit: Payer: Self-pay | Admitting: Student

## 2022-07-07 NOTE — Telephone Encounter (Signed)
Medication was sent in on 07/05/2022 by covering provider Adah Salvage.   However, prescription failed to transmit.   Will resend at this time.

## 2022-07-10 ENCOUNTER — Encounter: Payer: Self-pay | Admitting: Student

## 2022-07-10 ENCOUNTER — Other Ambulatory Visit: Payer: Self-pay | Admitting: Student

## 2022-07-10 ENCOUNTER — Telehealth: Payer: Self-pay | Admitting: Student

## 2022-07-10 MED ORDER — SEMAGLUTIDE (1 MG/DOSE) 4 MG/3ML ~~LOC~~ SOPN: 1.0000 mg | PEN_INJECTOR | SUBCUTANEOUS | 1 refills | Status: DC

## 2022-07-10 NOTE — Progress Notes (Signed)
Patient recently seen for management of DM, and had A1c checked at later date, when appropriate. A1c 7.5 from 7.2 previously. Patient sugars are modestly well controlled w/ goal less then 7. However patient having issues w/ hypoglycemia and not eating but still taking insulin. Patient hypoglycemic at last office visit. Pharmacy plans to get patient CGM, and encouraged patient to eat, especially since she is on insulin.   Will not adjust DM regimen at this time, given issues w/ hypoglycemia and reasonably well controlled A1c. Will f/u in 6 months.

## 2022-07-10 NOTE — Telephone Encounter (Signed)
Called to inform patient that A1c is 7.5, slightly up from previously (7.2). Will not make any changes to diabetes regimen at this time, and will re check her A1c in 6 months.   Also switched patient to Ozempic from [place], as her insurance would no longer cover the Victoza.

## 2022-07-10 NOTE — Progress Notes (Signed)
Patient switched from Victoza to [place], as per pharmacy, victoza no longer covered by insurance. Pharmacy request alternative. Patient on max dose of victoza, will start her on 2nd highest dose ozempic.

## 2022-07-11 NOTE — Telephone Encounter (Signed)
Patient returns call to nurse line.   Patient advised of A1c result and medication change.   Patient was appreciative of information.

## 2022-07-12 ENCOUNTER — Telehealth: Payer: Self-pay

## 2022-07-12 NOTE — Telephone Encounter (Signed)
A Prior Authorization was initiated for this patients OZEMPIC through CoverMyMeds.   Key: [id number]

## 2022-07-13 NOTE — Telephone Encounter (Signed)
Prior Auth for patients medication OZEMPIC approved by [facility] MEDICARE from 07/10/22 to FURTHER NOTICE.   Key: [id number]

## 2022-07-17 ENCOUNTER — Other Ambulatory Visit: Payer: Self-pay | Admitting: Student

## 2022-07-25 ENCOUNTER — Telehealth: Payer: Self-pay

## 2022-07-25 NOTE — Telephone Encounter (Signed)
Patient calls nurse line reporting difficulties picking up Ozempic and Novolog.   She reports the copays exceed 800 dollars per month total.   I called the pharmacy to clarify. It appears we got authorization through her insurance for [place], however he reports "she must be in the donut hole."   Patient reports she has filed for more assistance through [employer], however reports she has not heard back.   She is requesting samples of these medication if possible until she can get more assistance.   Will forward to pharmacy team.

## 2022-07-26 MED ORDER — FIASP 100 UNIT/ML IJ SOLN: INTRAMUSCULAR | 0 refills | Status: DC

## 2022-07-26 NOTE — Telephone Encounter (Signed)
Patients presents to [facility] to pick up up Fiasp samples provided by [facility].   2 boxes were handed to patient.   Lot: [id number] Exp: 12/01/2023  Patient appreciative and all questions were answered.

## 2022-07-29 ENCOUNTER — Other Ambulatory Visit: Payer: Self-pay | Admitting: Family Medicine

## 2022-07-29 DIAGNOSIS — E118 Type 2 diabetes mellitus with unspecified complications: Secondary | ICD-10-CM

## 2022-07-31 ENCOUNTER — Other Ambulatory Visit: Payer: Self-pay | Admitting: Student

## 2022-07-31 DIAGNOSIS — E118 Type 2 diabetes mellitus with unspecified complications: Secondary | ICD-10-CM

## 2022-08-01 NOTE — Addendum Note (Signed)
Addended by: Lona Millard C on: 08/01/2022 11:13 AM   Modules accepted: Orders

## 2022-08-01 NOTE — Telephone Encounter (Signed)
Dr. Nori Riis- can you please send in this prescription. Medicare requires attending for refill.   Thanks.   Talbot Grumbling, RN

## 2022-08-02 MED ORDER — ACCU-CHEK AVIVA PLUS VI STRP: ORAL_STRIP | 2 refills | Status: DC

## 2022-08-02 NOTE — Addendum Note (Signed)
Addended byDorcas Mcmurray L on: 08/02/2022 02:50 PM   Modules accepted: Orders

## 2022-08-03 DIAGNOSIS — H43813 Vitreous degeneration, bilateral: Secondary | ICD-10-CM | POA: Diagnosis not present

## 2022-08-03 DIAGNOSIS — E113591 Type 2 diabetes mellitus with proliferative diabetic retinopathy without macular edema, right eye: Secondary | ICD-10-CM | POA: Diagnosis not present

## 2022-08-03 DIAGNOSIS — H35033 Hypertensive retinopathy, bilateral: Secondary | ICD-10-CM | POA: Diagnosis not present

## 2022-08-03 DIAGNOSIS — H35373 Puckering of macula, bilateral: Secondary | ICD-10-CM | POA: Diagnosis not present

## 2022-08-03 DIAGNOSIS — E113512 Type 2 diabetes mellitus with proliferative diabetic retinopathy with macular edema, left eye: Secondary | ICD-10-CM | POA: Diagnosis not present

## 2022-08-08 ENCOUNTER — Other Ambulatory Visit: Payer: Self-pay | Admitting: Family Medicine

## 2022-08-08 DIAGNOSIS — E118 Type 2 diabetes mellitus with unspecified complications: Secondary | ICD-10-CM

## 2022-08-08 MED ORDER — ACCU-CHEK AVIVA PLUS VI STRP: ORAL_STRIP | 2 refills | Status: DC

## 2022-08-08 NOTE — Addendum Note (Signed)
Addended by: Dorna Bloom on: 08/08/2022 09:54 AM   Modules accepted: Orders

## 2022-08-08 NOTE — Telephone Encounter (Signed)
Patient calls nurse line reporting continued difficulty picking up test strips.   The pharmacy needs an attending DEA and diagnosis code.   Will resend prescription.

## 2022-08-09 ENCOUNTER — Other Ambulatory Visit: Payer: Self-pay | Admitting: Student

## 2022-08-09 MED ORDER — GLUCOSE BLOOD VI STRP: ORAL_STRIP | 12 refills | Status: DC

## 2022-08-09 NOTE — Progress Notes (Signed)
Patient needing new glucose test strips, note from refill request from pharmacy noting that those strips no longer covered.

## 2022-08-17 ENCOUNTER — Ambulatory Visit: Payer: Medicare HMO

## 2022-08-17 ENCOUNTER — Ambulatory Visit (INDEPENDENT_AMBULATORY_CARE_PROVIDER_SITE_OTHER): Payer: Medicare HMO | Admitting: Podiatry

## 2022-08-17 DIAGNOSIS — B351 Tinea unguium: Secondary | ICD-10-CM | POA: Diagnosis not present

## 2022-08-17 DIAGNOSIS — M792 Neuralgia and neuritis, unspecified: Secondary | ICD-10-CM | POA: Diagnosis not present

## 2022-08-17 DIAGNOSIS — E1149 Type 2 diabetes mellitus with other diabetic neurological complication: Secondary | ICD-10-CM

## 2022-08-17 DIAGNOSIS — M722 Plantar fascial fibromatosis: Secondary | ICD-10-CM

## 2022-08-17 DIAGNOSIS — M79675 Pain in left toe(s): Secondary | ICD-10-CM | POA: Diagnosis not present

## 2022-08-17 DIAGNOSIS — M79674 Pain in right toe(s): Secondary | ICD-10-CM | POA: Diagnosis not present

## 2022-08-17 DIAGNOSIS — M79671 Pain in right foot: Secondary | ICD-10-CM

## 2022-08-17 DIAGNOSIS — M159 Polyosteoarthritis, unspecified: Secondary | ICD-10-CM

## 2022-08-17 MED ORDER — MELOXICAM 15 MG PO TABS: 15.0000 mg | ORAL_TABLET | Freq: Every day | ORAL | 0 refills | Status: DC | PRN

## 2022-08-17 NOTE — Patient Instructions (Signed)

## 2022-08-17 NOTE — Progress Notes (Signed)
Subjective: Chief Complaint  Patient presents with   Foot Problem    NAIL TRIM, BILATERAL FEET HAVE PAIN IN THE PADS OF THE TOES   67 year old female presents the office today with above concerns.  She states that she has been some nerve pain to the big toe.  She has been some discomfort at the bottom of her foot as well.  She does not recall any injury.  This started out 1 month ago.  No swelling.  No radiating pain.  She is also send the nails be trimmed as are thickened elongated she has difficulty trimming herself.  Objective: AAO x3, NAD DP/PT pulses palpable bilaterally, CRT less than 3 seconds She has tenderness to the distal aspect of the toes and she describes nerve type symptoms.  Sensation appears to be intact with Thornell Mule monofilament is a negative Tinel's sign today.  She does get some tenderness to the arch of the foot as well as the plantar aspect the heel.  There is no pain with lateral compression of calcaneus.  No area pinpoint tenderness.  There is no edema, erythema or any open lesions.   Nails are hypertrophic, dystrophic, brittle, discolored, elongated 10. No surrounding redness or drainage. Tenderness nails 1-5 bilaterally. No open lesions or pre-ulcerative lesions are identified today. No pain with calf compression, swelling, warmth, erythema  Assessment: 67 year old female with neuritis; symptomatic onychomycosis  Plan: -All treatment options discussed with the patient including all alternatives, risks, complications.  -X-rays were obtained reviewed.  3 views of the foot were obtained.  No evidence of acute fracture.  Previous fracture noted to the fourth digit. -We discussed possible neuropathy but she is already on gabapentin.  Discussed possible inflammation of the nerve we discussed stretching, icing on regular basis as well as shoes and good arch support. Prescribed mobic. Discussed side effects of the medication and directed to stop if any are to occur  and call the office.  -Sharply debrided nails x 10 without any complications or bleeding. -Patient encouraged to call the office with any questions, concerns, change in symptoms.   Trula Slade DPM

## 2022-09-01 ENCOUNTER — Other Ambulatory Visit: Payer: Self-pay | Admitting: Student

## 2022-09-01 DIAGNOSIS — Z794 Long term (current) use of insulin: Secondary | ICD-10-CM

## 2022-09-07 ENCOUNTER — Other Ambulatory Visit: Payer: Self-pay | Admitting: Student

## 2022-09-13 ENCOUNTER — Other Ambulatory Visit: Payer: Self-pay | Admitting: Podiatry

## 2022-09-13 DIAGNOSIS — M792 Neuralgia and neuritis, unspecified: Secondary | ICD-10-CM

## 2022-09-14 ENCOUNTER — Other Ambulatory Visit: Payer: Self-pay | Admitting: Cardiology

## 2022-09-14 ENCOUNTER — Ambulatory Visit: Payer: Medicare HMO | Admitting: Podiatry

## 2022-09-18 ENCOUNTER — Other Ambulatory Visit: Payer: Self-pay

## 2022-09-18 MED ORDER — FIASP 100 UNIT/ML IJ SOLN: INTRAMUSCULAR | 2 refills | Status: DC

## 2022-09-20 ENCOUNTER — Encounter (HOSPITAL_COMMUNITY): Payer: Self-pay

## 2022-09-20 ENCOUNTER — Emergency Department (HOSPITAL_COMMUNITY): Payer: Medicare Other

## 2022-09-20 ENCOUNTER — Other Ambulatory Visit: Payer: Self-pay

## 2022-09-20 ENCOUNTER — Emergency Department (HOSPITAL_COMMUNITY)
Admission: EM | Admit: 2022-09-20 | Discharge: 2022-09-20 | Disposition: A | Discharge status: Home / Self Care | Payer: Medicare Other | Attending: Emergency Medicine | Admitting: Emergency Medicine

## 2022-09-20 DIAGNOSIS — M79602 Pain in left arm: Secondary | ICD-10-CM | POA: Diagnosis not present

## 2022-09-20 DIAGNOSIS — I10 Essential (primary) hypertension: Secondary | ICD-10-CM | POA: Insufficient documentation

## 2022-09-20 DIAGNOSIS — E7800 Pure hypercholesterolemia, unspecified: Secondary | ICD-10-CM | POA: Diagnosis not present

## 2022-09-20 DIAGNOSIS — Z7984 Long term (current) use of oral hypoglycemic drugs: Secondary | ICD-10-CM | POA: Diagnosis not present

## 2022-09-20 DIAGNOSIS — S42292A Other displaced fracture of upper end of left humerus, initial encounter for closed fracture: Secondary | ICD-10-CM | POA: Diagnosis not present

## 2022-09-20 DIAGNOSIS — E11649 Type 2 diabetes mellitus with hypoglycemia without coma: Secondary | ICD-10-CM | POA: Diagnosis not present

## 2022-09-20 DIAGNOSIS — R079 Chest pain, unspecified: Secondary | ICD-10-CM | POA: Diagnosis not present

## 2022-09-20 DIAGNOSIS — S42202A Unspecified fracture of upper end of left humerus, initial encounter for closed fracture: Secondary | ICD-10-CM | POA: Diagnosis not present

## 2022-09-20 DIAGNOSIS — W19XXXA Unspecified fall, initial encounter: Secondary | ICD-10-CM

## 2022-09-20 DIAGNOSIS — R42 Dizziness and giddiness: Secondary | ICD-10-CM | POA: Diagnosis not present

## 2022-09-20 DIAGNOSIS — Z7982 Long term (current) use of aspirin: Secondary | ICD-10-CM | POA: Insufficient documentation

## 2022-09-20 DIAGNOSIS — Z043 Encounter for examination and observation following other accident: Secondary | ICD-10-CM | POA: Diagnosis not present

## 2022-09-20 DIAGNOSIS — Z79899 Other long term (current) drug therapy: Secondary | ICD-10-CM | POA: Diagnosis not present

## 2022-09-20 DIAGNOSIS — Z794 Long term (current) use of insulin: Secondary | ICD-10-CM | POA: Insufficient documentation

## 2022-09-20 DIAGNOSIS — S42232A 3-part fracture of surgical neck of left humerus, initial encounter for closed fracture: Secondary | ICD-10-CM | POA: Diagnosis not present

## 2022-09-20 DIAGNOSIS — S42212A Unspecified displaced fracture of surgical neck of left humerus, initial encounter for closed fracture: Secondary | ICD-10-CM | POA: Diagnosis not present

## 2022-09-20 DIAGNOSIS — E1142 Type 2 diabetes mellitus with diabetic polyneuropathy: Secondary | ICD-10-CM | POA: Diagnosis not present

## 2022-09-20 LAB — CBC WITH DIFFERENTIAL/PLATELET
Abs Immature Granulocytes: 0.03 10*3/uL (ref 0.00–0.07)
Basophils Absolute: 0 10*3/uL (ref 0.0–0.1)
Basophils Relative: 0 %
Eosinophils Absolute: 0.1 10*3/uL (ref 0.0–0.5)
Eosinophils Relative: 1 %
HCT: 40.1 % (ref 36.0–46.0)
Hemoglobin: 12.9 g/dL (ref 12.0–15.0)
Immature Granulocytes: 0 %
Lymphocytes Relative: 15 %
Lymphs Abs: 1.4 10*3/uL (ref 0.7–4.0)
MCH: 30.7 pg (ref 26.0–34.0)
MCHC: 32.2 g/dL (ref 30.0–36.0)
MCV: 95.5 fL (ref 80.0–100.0)
Monocytes Absolute: 0.6 10*3/uL (ref 0.1–1.0)
Monocytes Relative: 7 %
Neutro Abs: 7.2 10*3/uL (ref 1.7–7.7)
Neutrophils Relative %: 77 %
Platelets: 298 10*3/uL (ref 150–400)
RBC: 4.2 MIL/uL (ref 3.87–5.11)
RDW: 13.6 % (ref 11.5–15.5)
WBC: 9.4 10*3/uL (ref 4.0–10.5)
nRBC: 0 % (ref 0.0–0.2)

## 2022-09-20 LAB — COMPREHENSIVE METABOLIC PANEL
ALT: 13 U/L (ref 0–44)
AST: 19 U/L (ref 15–41)
Albumin: 3.8 g/dL (ref 3.5–5.0)
Alkaline Phosphatase: 71 U/L (ref 38–126)
Anion gap: 10 (ref 5–15)
BUN: 15 mg/dL (ref 8–23)
CO2: 27 mmol/L (ref 22–32)
Calcium: 10 mg/dL (ref 8.9–10.3)
Chloride: 98 mmol/L (ref 98–111)
Creatinine, Ser: 0.75 mg/dL (ref 0.44–1.00)
GFR, Estimated: >60 mL/min (ref >60)
Glucose, Bld: 128 mg/dL — ABNORMAL HIGH (ref 70–99)
Potassium: 3.9 mmol/L (ref 3.5–5.1)
Sodium: 135 mmol/L (ref 135–145)
Total Bilirubin: 0.3 mg/dL (ref 0.3–1.2)
Total Protein: 7 g/dL (ref 6.5–8.1)

## 2022-09-20 LAB — CBG MONITORING, ED
Glucose-Capillary: 50 mg/dL — ABNORMAL LOW (ref 70–99)
Glucose-Capillary: 112 mg/dL — ABNORMAL HIGH (ref 70–99)

## 2022-09-20 LAB — MAGNESIUM: Magnesium: 1.7 mg/dL (ref 1.7–2.4)

## 2022-09-20 MED ORDER — OXYCODONE-ACETAMINOPHEN 5-325 MG PO TABS: 1.0000 | ORAL_TABLET | Freq: Four times a day (QID) | ORAL | 0 refills | Status: DC | PRN

## 2022-09-20 MED ORDER — FENTANYL CITRATE PF 50 MCG/ML IJ SOSY
25.0000 ug | PREFILLED_SYRINGE | Freq: Once | INTRAMUSCULAR | Status: AC
  Administered 2022-09-20: 25 ug via INTRAVENOUS
  Filled 2022-09-20: qty 1

## 2022-09-20 MED ORDER — OXYCODONE-ACETAMINOPHEN 5-325 MG PO TABS
1.0000 | ORAL_TABLET | Freq: Once | ORAL | Status: AC
  Administered 2022-09-20: 1 via ORAL
  Filled 2022-09-20: qty 1

## 2022-09-20 NOTE — ED Notes (Signed)
CBG 50  Reported to RN  Crackers and orange juice given to pt

## 2022-09-20 NOTE — Progress Notes (Signed)
Orthopedic Tech Progress Note Patient Details:  Alicia [name] Nov 17, 1955 [id number]  Ortho Devices Type of Ortho Device: Shoulder immobilizer Ortho Device/Splint Location: LUE Ortho Device/Splint Interventions: Application   Post Interventions Patient Tolerated: Well  Linus Salmons Larya Charpentier 09/20/2022, 12:50 PM

## 2022-09-20 NOTE — Discharge Instructions (Addendum)
You were seen in the emergency department today for a fall.  Your labs are reassuring.  I think that you likely fell because your blood sugar was low.  It has been normal here in the emergency department after drinking some juice.  Please monitor this closely over the next few days and have sugar products nearby should your sugar fall.  When you fell you did fracture the upper part of your left arm.  We have placed you in a sling.  You are to see Dr. Marcelino Scot with orthopedic surgery on Monday morning in his clinic.  Please call when you leave the emergency department to schedule this follow-up appointment for Monday.  I have also sent a electronic referral to ensure that they have your information.  Please return to the emergency department for significantly worsening pain not controlled by her medication.  I have prescribed you oxycodone-acetaminophen also known as Percocet that you can use every 6 hours for pain.  You have been prescribed a medication that is considered an opiate. Opiates are pain medications that should be used with caution. It is important that you do not drive while taking this medication as it can cause drowsiness and impaired reaction times. Do not mix this medication with benzodiazepine medications or alcohol as this can cause respiratory depression. Additionally, opiates have addicting properties to them. Please use medication as prescribed by your provider.

## 2022-09-20 NOTE — Consult Note (Signed)
Reason for Consult:Left humerus fx Referring Physician: Tretha Sciara Time called: [id number] Time at bedside: [place] is an 67 y.o. female.  HPI: Shenay's leg buckled on her this morning and she fell at home. She had immediate left shoulder pain. She came to the ED where x-rays showed a left humerus fx and orthopedic surgery was consulted. She is RHD and retired.  Past Medical History:  Diagnosis Date   Anemia    Arthritis    back and knees   ARTHRITIS, KNEE 09/17/2007   Asthma 04/04/2010   pt states she does not have asthma   Cataract 01/07/2019   Closed fracture of distal end of left radius 11/01/2015   Complete deafness    Meningitis at age 46   Deaf    Diabetes mellitus    Diabetic neuropathy ([place])    Ganglion cyst 06/22/2011   Gastroparesis    GERD (gastroesophageal reflux disease)    H/O: C-section    Hyperlipidemia    Hypertension    Neuromuscular disorder ([place])    diabetic neuropathy   PSVT (paroxysmal supraventricular tachycardia) 11/09/2017   Event monitor 09/14/2017 - Predominantly sinus rhythm with episodes of narrow-complex tachycardia suggestive of paroxysmal supraventricular tachycardia.   S/P appy     Past Surgical History:  Procedure Laterality Date   APPENDECTOMY     cardiolyte EF 77%, no ischemia in 2006  2006   [place] Right 04/20/2015   Procedure: RIGHT CARPAL TUNNEL RELEASE;  Surgeon: Daryll Brod, MD;  Location: [place];  Service: Orthopedics;  Laterality: Right;   CARPAL TUNNEL RELEASE Left 11/04/2015   Procedure: CARPAL TUNNEL RELEASE;  Surgeon: Daryll Brod, MD;  Location: [place];  Service: Orthopedics;  Laterality: Left;   CESAREAN SECTION     OPEN REDUCTION INTERNAL FIXATION (ORIF) DISTAL RADIAL FRACTURE Left 11/04/2015   Procedure: OPEN REDUCTION INTERNAL FIXATION (ORIF) LEFT DISTAL RADIAL FRACTURE POSSIBLE BONE GRAFT;  Surgeon: Daryll Brod, MD;  Location: [facility];   Service: Orthopedics;  Laterality: Left;   POSTERIOR CERVICAL FUSION/FORAMINOTOMY N/A 12/26/2021   Procedure: Posterior cervical fusion with lateral mass fixation - Cervical Three-Cervical Six, Cervical Laminectomy Cervical Three-Cervica; Five;  Surgeon: Eustace Moore, MD;  Location: [facility];  Service: Neurosurgery;  Laterality: N/A;   TRIGGER FINGER RELEASE Right 04/20/2015   Procedure: RELEASE TRIGGER FINGER/A-1 PULLEY RIGHT MIDDLE FINGER,RIGHT RING FINGER;  Surgeon: Daryll Brod, MD;  Location: [facility];  Service: Orthopedics;  Laterality: Right;   TUBAL LIGATION     ULNAR NERVE TRANSPOSITION Right 04/20/2015   Procedure: RIGHT ULNAR NERVE DECOMPRESSION;  Surgeon: Daryll Brod, MD;  Location: [place] [facility];  Service: Orthopedics;  Laterality: Right;   UTERINE FIBROID EMBOLIZATION  2007   WRIST SURGERY     Cyst removed on left   WRIST SURGERY Right 1985   tendon repair R wrist    Family History  Problem Relation Age of Onset   Hypertension Mother    Heart attack Mother 15   Diabetes Father    Cancer Maternal Aunt    Cancer Maternal Grandmother    Neuropathy Neg Hx    Breast cancer Neg Hx     Social History:  reports that she has never smoked. She has never used smokeless tobacco. She reports current alcohol use of about 3.0 standard drinks of alcohol per week. She reports that she does not use drugs.  Allergies:  Allergies  Allergen Reactions  Sulfonamide Derivatives Swelling and Rash    REACTION: rash, swelling - "Lost BABY" - Terrible itching.    Lipitor [Atorvastatin Calcium] Other (See Comments)    Muscle Aches - Mild-Moderate - completely resolved with D/C of atorva.    Ramipril Other (See Comments)    REACTION: cough    Trulicity [Dulaglutide] Nausea And Vomiting    Medications: I have reviewed the patient's current medications.  Results for orders placed or performed during the hospital encounter of 09/20/22 (from the past 48 hour(s))  CBG  monitoring, ED     Status: Abnormal   Collection Time: 09/20/22  7:59 AM  Result Value Ref Range   Glucose-Capillary 50 (L) 70 - 99 mg/dL    Comment: Glucose reference range applies only to samples taken after fasting for at least 8 hours.  CBC with Differential     Status: None   Collection Time: 09/20/22  8:20 AM  Result Value Ref Range   WBC 9.4 4.0 - 10.5 K/uL   RBC 4.20 3.87 - 5.11 MIL/uL   Hemoglobin 12.9 12.0 - 15.0 g/dL   HCT 40.1 36.0 - 46.0 %   MCV 95.5 80.0 - 100.0 fL   MCH 30.7 26.0 - 34.0 pg   MCHC 32.2 30.0 - 36.0 g/dL   RDW 13.6 11.5 - 15.5 %   Platelets 298 150 - 400 K/uL   nRBC 0.0 0.0 - 0.2 %   Neutrophils Relative % 77 %   Neutro Abs 7.2 1.7 - 7.7 K/uL   Lymphocytes Relative 15 %   Lymphs Abs 1.4 0.7 - 4.0 K/uL   Monocytes Relative 7 %   Monocytes Absolute 0.6 0.1 - 1.0 K/uL   Eosinophils Relative 1 %   Eosinophils Absolute 0.1 0.0 - 0.5 K/uL   Basophils Relative 0 %   Basophils Absolute 0.0 0.0 - 0.1 K/uL   Immature Granulocytes 0 %   Abs Immature Granulocytes 0.03 0.00 - 0.07 K/uL    Comment: Performed at [facility], [address], [place], [place] [address]  POC CBG, ED     Status: Abnormal   Collection Time: 09/20/22  8:37 AM  Result Value Ref Range   Glucose-Capillary 112 (H) 70 - 99 mg/dL    Comment: Glucose reference range applies only to samples taken after fasting for at least 8 hours.   *Note: Due to a large number of results and/or encounters for the requested time period, some results have not been displayed. A complete set of results can be found in Results Review.    DG Elbow 2 Views Left  Result Date: 09/20/2022 CLINICAL DATA:  67 year old female status post fall. EXAM: LEFT ELBOW - 2 VIEW COMPARISON:  Left humerus and shoulder series today. FINDINGS: Bone mineralization is within normal limits for age. Maintained alignment at the left elbow. No evidence of elbow joint effusion on the cross-table lateral view. Distal humerus,  proximal radius and ulna appear intact. IMPRESSION: No acute fracture or dislocation identified about the left elbow. Electronically Signed   By: Genevie Ann M.D.   On: 09/20/2022 09:43   DG Shoulder Left  Result Date: 09/20/2022 CLINICAL DATA:  67 year old female status post fall. EXAM: LEFT HUMERUS - 2+ VIEW; LEFT SHOULDER - 2+ VIEW COMPARISON:  Left elbow series today reported separately. FINDINGS: LEFT HUMERUS: Comminuted, impacted and anteriorly displaced proximal left humerus fracture affecting both the humeral head and proximal metadiaphysis. 4.5 cm butterfly fragment along the medial proximal shaft. Mid and distal  left humerus appear intact. LEFT SHOULDER: Proximal left humerus fracture. Left glenohumeral alignment appears relatively maintained. Left clavicle and scapula appear to remain intact. Partially visible posterior left lower cervical spine fusion hardware. Intact visible left ribs. IMPRESSION: 1. Comminuted, impacted and anteriorly displaced proximal left humerus fracture affecting the humeral head and proximal metadiaphysis. 4.5 cm medial proximal shaft butterfly fragment. Glenohumeral alignment appears relatively preserved. 2. Left clavicle and scapula intact. 3. Left elbow reported separately. Electronically Signed   By: Genevie Ann M.D.   On: 09/20/2022 09:42   DG Humerus Left  Result Date: 09/20/2022 CLINICAL DATA:  67 year old female status post fall. EXAM: LEFT HUMERUS - 2+ VIEW; LEFT SHOULDER - 2+ VIEW COMPARISON:  Left elbow series today reported separately. FINDINGS: LEFT HUMERUS: Comminuted, impacted and anteriorly displaced proximal left humerus fracture affecting both the humeral head and proximal metadiaphysis. 4.5 cm butterfly fragment along the medial proximal shaft. Mid and distal left humerus appear intact. LEFT SHOULDER: Proximal left humerus fracture. Left glenohumeral alignment appears relatively maintained. Left clavicle and scapula appear to remain intact. Partially visible  posterior left lower cervical spine fusion hardware. Intact visible left ribs. IMPRESSION: 1. Comminuted, impacted and anteriorly displaced proximal left humerus fracture affecting the humeral head and proximal metadiaphysis. 4.5 cm medial proximal shaft butterfly fragment. Glenohumeral alignment appears relatively preserved. 2. Left clavicle and scapula intact. 3. Left elbow reported separately. Electronically Signed   By: Genevie Ann M.D.   On: 09/20/2022 09:42    Review of Systems  HENT:  Negative for ear discharge, ear pain, hearing loss and tinnitus.   Eyes:  Negative for photophobia and pain.  Respiratory:  Negative for cough and shortness of breath.   Cardiovascular:  Negative for chest pain.  Gastrointestinal:  Negative for abdominal pain, nausea and vomiting.  Genitourinary:  Negative for dysuria, flank pain, frequency and urgency.  Musculoskeletal:  Positive for arthralgias (Left shoulder). Negative for back pain, myalgias and neck pain.  Neurological:  Negative for dizziness and headaches.  Hematological:  Does not bruise/bleed easily.  Psychiatric/Behavioral:  The patient is not nervous/anxious.    Blood pressure 137/73, pulse 75, temperature (!) 97.4 F (36.3 C), temperature source Oral, resp. rate 13, height 5\' 4"  (1.626 m), weight 63.5 kg, SpO2 93 %. Physical Exam Constitutional:      General: She is not in acute distress.    Appearance: She is well-developed. She is not diaphoretic.  HENT:     Head: Normocephalic and atraumatic.  Eyes:     General: No scleral icterus.       Right eye: No discharge.        Left eye: No discharge.     Conjunctiva/sclera: Conjunctivae normal.  Cardiovascular:     Rate and Rhythm: Normal rate and regular rhythm.  Pulmonary:     Effort: Pulmonary effort is normal. No respiratory distress.  Musculoskeletal:     Cervical back: Normal range of motion.     Comments: Left shoulder, elbow, wrist, digits- no skin wounds, mod TTP shoulder, no  instability, no blocks to motion  Sens  Ax/R/M/U intact  Mot   Ax/ R/ PIN/ M/ AIN/ U intact  Rad 2+  Skin:    General: Skin is warm and dry.  Neurological:     Mental Status: She is alert.  Psychiatric:        Mood and Affect: Mood normal.        Behavior: Behavior normal.     Assessment/Plan: Left humerus fx --  Will get CT but hopefully can treat this non-operatively with sling and NWB. F/u with Dr. Marcelino Scot in 2 weeks.    Lisette Abu, PA-C Orthopedic Surgery [phone] 09/20/2022, 10:47 AM

## 2022-09-20 NOTE — ED Triage Notes (Addendum)
Pt arrived POV from home c/o a fall. Pt went to use the restroom and felt like her CBG was low so she went to the kitchen to get some juice while in the kitchen pt states she has been having leg cramps for a few days this started and caused her to fall onto her left arm. Pt denies LOC, hitting her head or blood thinner use. Pt is c/o left arm pain and left thigh pain.

## 2022-09-20 NOTE — ED Notes (Signed)
CBG- 112 

## 2022-09-20 NOTE — ED Notes (Signed)
Called lab for results of CMP and [place]. Specimen sent to lab at 0950. Per lab tech, the results are still pending.

## 2022-09-20 NOTE — ED Notes (Signed)
Left shoulder pain radiating down left arm. Distal CMS intact.

## 2022-09-20 NOTE — ED Provider Notes (Signed)
[facility] Provider Note   CSN: [id number] Arrival date & time: 09/20/22  [id number]     History  Chief Complaint  Patient presents with   Lytle Michaels    Alicia [name] is a 67 y.o. female. With past medical history of type 2 diabetes, neuropathy, HLD, GERD, HTN, deafness who presents to the emergency department with fall.   Patient states this morning she was in the restroom doing her usual morning routine when she began to feel woozy.  She states that she went to the kitchen feeling that her sugar was low.  She drank some orange juice.  She then began having some leg cramping and fell onto her left arm.  She denies striking her head or loss of consciousness.  She is not anticoagulated.  She states that her legs have been cramping over the past few weeks.  She does have a low back pain history and is wondering if that is the reason for it.  She denies having any recent illnesses.  She denies having  prodromal symptoms for her fall like chest pain, shortness of breath, palpitations, diaphoresis.  She currently denies chest pain, shortness of breath, palpitations, lightheadedness or dizziness, nausea.  She denies recent history of dysuria, nausea, vomiting, diarrhea, fevers.   HPI     Home Medications Prior to Admission medications   Medication Sig Start Date End Date Taking? Authorizing Provider  oxyCODONE-acetaminophen (PERCOCET/ROXICET) 5-325 MG tablet Take 1 tablet by mouth every 6 (six) hours as needed for severe pain. 09/20/22  Yes Mickie Hillier, PA-C  ACCU-CHEK SOFTCLIX LANCETS lancets TEST 4 TIMES A DAY 07/11/17   Everrett Coombe, MD  acetaminophen (TYLENOL) 650 MG CR tablet Take 2 tablets (1,300 mg total) by mouth every 8 (eight) hours as needed for pain. 11/26/20   Zenia Resides, MD  aspirin 81 MG chewable tablet Chew 81 mg by mouth daily.    [provider]  carvedilol (COREG) 25 MG tablet TAKE 1 TABLET BY MOUTH TWICE A DAY. 04/03/22    Josue Hector, MD  cholecalciferol (VITAMIN D) 1000 UNITS tablet Take 1,000 Units by mouth daily.    [provider]  ciclopirox (PENLAC) 8 % solution Apply topically at bedtime. Apply over nail and surrounding skin. Apply daily over previous coat. After seven (7) days, may remove with alcohol and continue cycle. 10/21/21   Trula Slade, DPM  Cyanocobalamin (VITAMIN B 12) 500 MCG TABS Take 500 mcg by mouth daily at 6 (six) AM.    [provider]  diclofenac Sodium (VOLTAREN) 1 % GEL Apply 2 g topically 4 (four) times daily. Rub into affected area of foot 2 to 4 times daily Patient taking differently: Apply 2 g topically 4 (four) times daily as needed (pain). Rub into affected area of foot 2 to 4 times daily 09/18/19   Trula Slade, DPM  diltiazem (CARDIZEM CD) 240 MG 24 hr capsule TAKE 1 CAPSULE BY MOUTH EVERY DAY 09/01/22   Holley Bouche, MD  diltiazem (CARDIZEM) 30 MG tablet TAKE 1 TABLET BY MOUTH AS NEEDED (HEART RACING OR PALPITATIONS THAT LAST LONGER THAN 15 MINS). 03/24/22   Baldwin Jamaica, PA-C  DULoxetine (CYMBALTA) 60 MG capsule TAKE 1 CAPSULE BY MOUTH EVERY DAY 10/25/21   Simmons-Robinson, Riki Sheer, MD  Efinaconazole 10 % SOLN Apply 1 drop topically daily. 08/06/21   Trula Slade, DPM  famotidine (PEPCID) 20 MG tablet TAKE 1 TABLET BY MOUTH TWICE  A DAY 09/07/22   Holley Bouche, MD  ferrous sulfate 325 (65 FE) MG tablet Take 325 mg by mouth daily with breakfast.    [provider]  fluticasone (FLONASE) 50 MCG/ACT nasal spray 2 puffs each nostril daily 03/07/22   Holley Bouche, MD  gabapentin (NEURONTIN) 800 MG tablet TAKE 1 TABLET BY MOUTH TWICE A DAY AS NEEDED 07/18/22   Holley Bouche, MD  glucose blood (ACCU-CHEK AVIVA PLUS) test strip USE TO CHECK BLOOD SUGAR 3 TIMES PER DAY 08/09/22   Holley Bouche, MD  glucose blood test strip Use as instructed 08/09/22   Holley Bouche, MD  hydrochlorothiazide (MICROZIDE) 12.5 MG capsule TAKE 1 CAPSULE BY  MOUTH EVERY DAY 07/07/22   Holley Bouche, MD  hydrocortisone (ANUSOL-HC) 2.5 % rectal cream Place 1 application rectally 2 (two) times daily. 01/27/21   Pearson Forster, NP  Insulin Aspart, w/Niacinamide, (FIASP) 100 UNIT/ML SOLN INJECT 2-4 UNITS INTO THE SKIN 3 (THREE) TIMES DAILY WITH MEALS. 09/18/22   Holley Bouche, MD  Insulin Glargine (BASAGLAR KWIKPEN) 100 UNIT/ML Inject 12 Units into the skin daily. 04/05/22   Holley Bouche, MD  Insulin Pen Needle 31G X 5 MM MISC 1 Container by Does not apply route once as needed for up to 1 dose. 10/27/20   Mullis, Kiersten P, DO  LINZESS 72 MCG capsule Take 72 mcg by mouth every morning. 09/10/19   [provider]  meloxicam (MOBIC) 15 MG tablet TAKE 1 TABLET BY MOUTH EVERY DAY AS NEEDED FOR PAIN 09/13/22   Trula Slade, DPM  metFORMIN (GLUCOPHAGE) 1000 MG tablet TAKE 1 TABLET BY MOUTH TWICE A DAY 09/01/22   Holley Bouche, MD  naproxen (NAPROSYN) 500 MG tablet Take 1 tablet (500 mg total) by mouth 2 (two) times daily with a meal. 08/17/21   Carollee Leitz, MD  Olopatadine HCl 0.2 % SOLN PLACE 1 DROP IN BOTH EYES DAILY 03/24/19   [provider]  pantoprazole (PROTONIX) 40 MG tablet Take 40 mg by mouth every morning. 08/23/20   [provider]  rosuvastatin (CRESTOR) 5 MG tablet TAKE 1 TABLET (5 MG TOTAL) BY MOUTH 2 TIMES A WEEK 09/14/22   Camnitz, Will Hassell Done, MD  Semaglutide, 1 MG/DOSE, 4 MG/3ML SOPN Inject 1 mg into the skin once a week. 07/10/22   Holley Bouche, MD  valsartan (DIOVAN) 160 MG tablet TAKE 1 TABLET BY MOUTH EVERYDAY AT BEDTIME 09/01/22   Holley Bouche, MD      Allergies    Sulfonamide derivatives, Lipitor [atorvastatin calcium], Ramipril, and Trulicity [dulaglutide]    Review of Systems   Review of Systems  Musculoskeletal:  Positive for arthralgias and myalgias.  Neurological:  Positive for light-headedness.  All other systems reviewed and are negative.   Physical Exam Updated Vital Signs BP 138/88    Pulse 77   Temp 97.8 F (36.6 C) (Oral)   Resp 13   Ht 5\' 4"  (1.626 m)   Wt 63.5 kg   SpO2 98%   BMI 24.03 kg/m  Physical Exam Vitals and nursing note reviewed.  Constitutional:      General: She is not in acute distress.    Appearance: Normal appearance. She is not toxic-appearing.  HENT:     Head: Normocephalic and atraumatic.     Mouth/Throat:     Mouth: Mucous membranes are moist.     Pharynx: Oropharynx is clear.  Eyes:     General: No scleral icterus.    Extraocular Movements: Extraocular movements intact.  Pupils: Pupils are equal, round, and reactive to light.  Cardiovascular:     Rate and Rhythm: Normal rate and regular rhythm.     Pulses: Normal pulses.     Heart sounds: No murmur heard. Pulmonary:     Effort: Pulmonary effort is normal. No respiratory distress.     Breath sounds: Normal breath sounds.  Abdominal:     General: Bowel sounds are normal. There is no distension.     Palpations: Abdomen is soft.     Tenderness: There is no abdominal tenderness.  Musculoskeletal:        General: Tenderness and signs of injury present.     Cervical back: Normal range of motion and neck supple. No tenderness.     Comments: Presents in a home sling to her left arm.  She has significant tenderness to the left shoulder, humerus.  Radial pulses 2+.  Extremity is warm and dry.  She is able to move all of her fingers.  Sensation is intact.  Slight deformity to the left upper arm/shoulder. Appears somewhat anterior compared to the right  Skin:    General: Skin is warm and dry.     Capillary Refill: Capillary refill takes less than 2 seconds.  Neurological:     General: No focal deficit present.     Mental Status: She is alert and oriented to person, place, and time. Mental status is at baseline.  Psychiatric:        Mood and Affect: Mood normal.        Behavior: Behavior normal.        Thought Content: Thought content normal.        Judgment: Judgment normal.     ED  Results / Procedures / Treatments   Labs (all labs ordered are listed, but only abnormal results are displayed) Labs Reviewed  COMPREHENSIVE METABOLIC PANEL - Abnormal; Notable for the following components:      Result Value   Glucose, Bld 128 (*)    All other components within normal limits  CBG MONITORING, ED - Abnormal; Notable for the following components:   Glucose-Capillary 50 (*)    All other components within normal limits  CBG MONITORING, ED - Abnormal; Notable for the following components:   Glucose-Capillary 112 (*)    All other components within normal limits  CBC WITH DIFFERENTIAL/PLATELET  MAGNESIUM    EKG None  Radiology CT SHOULDER LEFT WO CONTRAST  Result Date: 09/20/2022 CLINICAL DATA:  Fall.  Left shoulder fracture EXAM: CT OF THE UPPER LEFT EXTREMITY WITHOUT CONTRAST TECHNIQUE: Multidetector CT imaging of the upper left extremity was performed according to the standard protocol. RADIATION DOSE REDUCTION: This exam was performed according to the departmental dose-optimization program which includes automated exposure control, adjustment of the mA and/or kV according to patient size and/or use of iterative reconstruction technique. COMPARISON:  X-ray 09/20/2022 FINDINGS: Bones/Joint/Cartilage Acute comminuted fracture of the proximal left humerus with fracture involvement of the surgical neck, proximal metaphysis, and greater tuberosity. Moderate fracture impaction and varus angulation at the level of the surgical neck. Vertically oriented fracture extending inferiorly to the proximal metadiaphysis is mildly medially displaced and angulated. Comminuted fracture involvement of the greater tuberosity is minimally displaced. No fracture involvement of the humeral head articular surface. Glenohumeral joint alignment is maintained without dislocation. Moderate sized glenohumeral joint lipohemarthrosis. Remaining osseous structures are otherwise intact. No additional fracture. AC  joint intact and unremarkable. Ligaments Suboptimally assessed by CT. Muscles and Tendons No rotator cuff  muscle atrophy. Tendons are grossly intact by CT although not well evaluated. Soft tissues Ill-defined hemorrhage at the fracture site. No left axillary lymphadenopathy. Included portion of the left upper lobe is clear. IMPRESSION: Acute comminuted fracture of the proximal left humerus, as described above. Electronically Signed   By: Davina Poke D.O.   On: 09/20/2022 11:40   DG Elbow 2 Views Left  Result Date: 09/20/2022 CLINICAL DATA:  67 year old female status post fall. EXAM: LEFT ELBOW - 2 VIEW COMPARISON:  Left humerus and shoulder series today. FINDINGS: Bone mineralization is within normal limits for age. Maintained alignment at the left elbow. No evidence of elbow joint effusion on the cross-table lateral view. Distal humerus, proximal radius and ulna appear intact. IMPRESSION: No acute fracture or dislocation identified about the left elbow. Electronically Signed   By: Genevie Ann M.D.   On: 09/20/2022 09:43   DG Shoulder Left  Result Date: 09/20/2022 CLINICAL DATA:  67 year old female status post fall. EXAM: LEFT HUMERUS - 2+ VIEW; LEFT SHOULDER - 2+ VIEW COMPARISON:  Left elbow series today reported separately. FINDINGS: LEFT HUMERUS: Comminuted, impacted and anteriorly displaced proximal left humerus fracture affecting both the humeral head and proximal metadiaphysis. 4.5 cm butterfly fragment along the medial proximal shaft. Mid and distal left humerus appear intact. LEFT SHOULDER: Proximal left humerus fracture. Left glenohumeral alignment appears relatively maintained. Left clavicle and scapula appear to remain intact. Partially visible posterior left lower cervical spine fusion hardware. Intact visible left ribs. IMPRESSION: 1. Comminuted, impacted and anteriorly displaced proximal left humerus fracture affecting the humeral head and proximal metadiaphysis. 4.5 cm medial proximal shaft  butterfly fragment. Glenohumeral alignment appears relatively preserved. 2. Left clavicle and scapula intact. 3. Left elbow reported separately. Electronically Signed   By: Genevie Ann M.D.   On: 09/20/2022 09:42   DG Humerus Left  Result Date: 09/20/2022 CLINICAL DATA:  67 year old female status post fall. EXAM: LEFT HUMERUS - 2+ VIEW; LEFT SHOULDER - 2+ VIEW COMPARISON:  Left elbow series today reported separately. FINDINGS: LEFT HUMERUS: Comminuted, impacted and anteriorly displaced proximal left humerus fracture affecting both the humeral head and proximal metadiaphysis. 4.5 cm butterfly fragment along the medial proximal shaft. Mid and distal left humerus appear intact. LEFT SHOULDER: Proximal left humerus fracture. Left glenohumeral alignment appears relatively maintained. Left clavicle and scapula appear to remain intact. Partially visible posterior left lower cervical spine fusion hardware. Intact visible left ribs. IMPRESSION: 1. Comminuted, impacted and anteriorly displaced proximal left humerus fracture affecting the humeral head and proximal metadiaphysis. 4.5 cm medial proximal shaft butterfly fragment. Glenohumeral alignment appears relatively preserved. 2. Left clavicle and scapula intact. 3. Left elbow reported separately. Electronically Signed   By: Genevie Ann M.D.   On: 09/20/2022 09:42    Procedures Procedures   Medications Ordered in ED Medications  fentaNYL (SUBLIMAZE) injection 25 mcg (has no administration in time range)  oxyCODONE-acetaminophen (PERCOCET/ROXICET) 5-325 MG per tablet 1 tablet (1 tablet Oral Given 09/20/22 0814)  fentaNYL (SUBLIMAZE) injection 25 mcg (25 mcg Intravenous Given 09/20/22 1022)    ED Course/ Medical Decision Making/ A&P Clinical Course as of 09/20/22 1239  Wed Sep 20, 2022  0952 Spoke with Silvestre Gunner with orthopedics who will evaluate patient.  [LA]  1003 GLF with prox humeral fracture  [CC]    Clinical Course User Index [CC] Tretha Sciara,  MD [LA] Mickie Hillier, PA-C    Medical Decision Making Amount and/or Complexity of Data Reviewed Labs: ordered. Radiology: ordered.  Risk Prescription drug management.  Initial Impression and Ddx 67 year old female who presents to the emergency department with fall and left arm pain Patient PMH that increases complexity of ED encounter: Diabetes, neuropathy, hypertension, deafness Differential: Syncope, hypoglycemia, electrolyte dysfunction, infection.  Fracture or subluxation on left arm pain  Interpretation of Diagnostics I independent reviewed and interpreted the labs as followed: Glucose 50 -> 112, CBC without leukocytosis or anemia, CMP within normal limits, mag normal  - I independently visualized the following imaging with scope of interpretation limited to determining acute life threatening conditions related to emergency care: plain film left humerus/shoulder, which revealed comminuted, impacted anterior displaced proximal left humerus fracture affecting the humeral head and proximal metadiaphysis.  4.5 cm medial proximal shaft butterfly fragment, CT of the left shoulder demonstrates the comminuted fracture  Patient Reassessment and Ultimate Disposition/Management Overall this is stable appearing 67 year old female who presents with fall after hypoglycemic event and left arm pain.  She does have hypoglycemia on arrival at 67.  We are able to give her juice and this return to 110.  She does have significant pain to the left arm with deformity.  Complaining of leg cramps.  Will obtain labs to evaluate for electrolytes, imaging for the left arm pain.  [id number]: Left arm with humeral comminuted, compression fracture. I consulted and spoke with Silvestre Gunner, PA-C with orthopedics who will come evaluate patient. Will redose with fentanyl given ongoing significant pain. Labs still in process. Glucose improved to 112 after juice.  Hilbert Odor, PA-C ordered a CT of the left shoulder  which demonstrates known findings.  He then recommends patient being placed in sling and swathe and to see Dr. Marcelino Scot, orthopedic surgery in the clinic on Monday.  Patient had some improvement with IV fentanyl and I will redose it prior to her being discharged.  The remainder of her labs are within normal limits.  I think that she likely had lightheadedness from hypoglycemia this morning at home.  She was hypoglycemic on her initial arrival which is improved and maintained here in the emergency department in the low 100s.  Her EKG does not show any malignant arrhythmia.  She is not anemic.  No evidence of infection.  Her electrolytes are within normal limits.  No AKI.  Magnesium is normal.  She is nonfocal and do not feel that she needs a CT of her head.  I have sent the ambulatory referral to orthopedic surgery and given her Dr. Carlean Jews information to call today to schedule her follow-up appointment for Monday, 09/25/2022.  I have also prescribed her Percocet.  Will have her discharge and she can return if pain is not being controlled at home with oral opiates.  She understands discharge instructions.  The entirety of her evaluation was performed with a interpreter at bedside.  The patient has been appropriately medically screened and/or stabilized in the ED. I have low suspicion for any other emergent medical condition which would require further screening, evaluation or treatment in the ED or require inpatient management. At time of discharge the patient is hemodynamically stable and in no acute distress. I have discussed work-up results and diagnosis with patient and answered all questions. Patient is agreeable with discharge plan. We discussed strict return precautions for returning to the emergency department and they verbalized understanding.     Patient management required discussion with the following services or consulting groups:  Orthopedic Surgery  Complexity of Problems Addressed Acute  complicated illness or Injury  Additional Data Reviewed  and Analyzed Further history obtained from: Past medical history and medications listed in the EMR, Prior ED visit notes, and Care Everywhere  Patient Encounter Risk Assessment Prescriptions, SDOH impact on management, Use of parenteral controlled substances, and Consideration of hospitalization  Final Clinical Impression(s) / ED Diagnoses Final diagnoses:  Fall, initial encounter    Rx / DC Orders ED Discharge Orders          Ordered    oxyCODONE-acetaminophen (PERCOCET/ROXICET) 5-325 MG tablet  Every 6 hours PRN        09/20/22 1235    Ambulatory referral to Orthopedic Surgery        09/20/22 1236              Mickie Hillier, PA-C 09/20/22 1242    Tretha Sciara, MD 09/20/22 1530

## 2022-09-22 DIAGNOSIS — H43813 Vitreous degeneration, bilateral: Secondary | ICD-10-CM | POA: Diagnosis not present

## 2022-09-22 DIAGNOSIS — E113591 Type 2 diabetes mellitus with proliferative diabetic retinopathy without macular edema, right eye: Secondary | ICD-10-CM | POA: Diagnosis not present

## 2022-09-22 DIAGNOSIS — E113512 Type 2 diabetes mellitus with proliferative diabetic retinopathy with macular edema, left eye: Secondary | ICD-10-CM | POA: Diagnosis not present

## 2022-09-22 DIAGNOSIS — H35033 Hypertensive retinopathy, bilateral: Secondary | ICD-10-CM | POA: Diagnosis not present

## 2022-09-25 DIAGNOSIS — S42232D 3-part fracture of surgical neck of left humerus, subsequent encounter for fracture with routine healing: Secondary | ICD-10-CM | POA: Diagnosis not present

## 2022-09-27 ENCOUNTER — Telehealth: Payer: Self-pay

## 2022-09-27 MED ORDER — INSULIN GLARGINE 100 UNIT/ML SOLOSTAR PEN: 12.0000 [IU] | PEN_INJECTOR | SUBCUTANEOUS | 11 refills | Status: DC

## 2022-09-27 NOTE — Telephone Encounter (Signed)
Patient calls nurse line in regards to [place] coverage.   She reports she received a letter in the mail from [facility] stating they will be no longer covering [place]. She reports there was no mention of alternatives in the letter.   Patient reports she has two pens left.   Will forward to pharmacy team for alternative options.

## 2022-09-28 NOTE — Telephone Encounter (Signed)
LVM informing patient.   I asked her to call me back with any questions.

## 2022-09-28 NOTE — Telephone Encounter (Signed)
Reviewed and agree with Dr Koval's plan.   

## 2022-10-04 DIAGNOSIS — S42232D 3-part fracture of surgical neck of left humerus, subsequent encounter for fracture with routine healing: Secondary | ICD-10-CM | POA: Diagnosis not present

## 2022-10-06 ENCOUNTER — Ambulatory Visit (INDEPENDENT_AMBULATORY_CARE_PROVIDER_SITE_OTHER): Payer: Medicare Other | Admitting: Podiatry

## 2022-10-06 DIAGNOSIS — M722 Plantar fascial fibromatosis: Secondary | ICD-10-CM

## 2022-10-06 DIAGNOSIS — E1149 Type 2 diabetes mellitus with other diabetic neurological complication: Secondary | ICD-10-CM | POA: Diagnosis not present

## 2022-10-06 NOTE — Progress Notes (Signed)
Subjective: Chief Complaint  Patient presents with   Plantar Fasciitis    6 week follow up   Diabetes    Diabetic nail trim    67 year old female presents the office today with above concerns.  She states that she has been some nerve pain to the big toe.  She has been some discomfort at th she states the arch of the foot feels tight.  No significant pain.  She does feel more numbness, tingling to her feet.  She does have diagnosed neuropathy.  She is currently on gabapentin 800 mg twice a day.  She recently had a injury fracturing her arm however no lower extremity injury.  Objective: AAO x3, NAD DP/PT pulses palpable bilaterally, CRT less than 3 seconds Subsequently burning, tingling mostly to her toes.  Sensation intact with Semmes Weinstein monofilament.  Still negative Tinel sign.   Slight discomfort to the arch of the foot along the medial band plantar fascia as well as of the heel.  Describes more tightness.  No area pinpoint tenderness.  No edema, erythema  No pain with calf compression, swelling, warmth, erythema  Assessment: 67 year old female with neuritis; symptomatic onychomycosis  Plan: -All treatment options discussed with the patient including all alternatives, risks, complications.  -I encouraged continued stretching, icing on a regular basis as well as shoes with good arch support to help with the arch discomfort as well as the heel. -She is currently on gabapentin.  Discussed possibly increasing this to 3 times a day if needed. -Daily foot inspection  Return in about 3 months (around 01/05/2023) for diabetic foot exam, nail trim.  Vivi Barrack DPM

## 2022-10-06 NOTE — Patient Instructions (Addendum)

## 2022-10-12 ENCOUNTER — Other Ambulatory Visit: Payer: Self-pay | Admitting: Student

## 2022-10-16 ENCOUNTER — Ambulatory Visit: Payer: Medicare Other | Attending: Orthopedic Surgery | Admitting: Physical Therapy

## 2022-10-16 ENCOUNTER — Encounter: Payer: Self-pay | Admitting: Physical Therapy

## 2022-10-16 DIAGNOSIS — M6281 Muscle weakness (generalized): Secondary | ICD-10-CM | POA: Insufficient documentation

## 2022-10-16 DIAGNOSIS — M25612 Stiffness of left shoulder, not elsewhere classified: Secondary | ICD-10-CM | POA: Diagnosis not present

## 2022-10-16 DIAGNOSIS — M25512 Pain in left shoulder: Secondary | ICD-10-CM | POA: Insufficient documentation

## 2022-10-16 NOTE — Therapy (Signed)
OUTPATIENT PHYSICAL THERAPY UPPER EXTREMITY EVALUATION   Patient Name: Alicia [name] MRN: [id number] DOB:[birth year], 67 y.o., female Today's Date: 10/16/2022  END OF SESSION:  PT End of Session - 10/16/22 1058     Visit Number 1    Date for PT Re-Evaluation 01/15/23    Authorization Type BCBS    PT Start Time 1055    PT Stop Time 1145    PT Time Calculation (min) 50 min    Activity Tolerance Patient tolerated treatment well    Behavior During Therapy WFL for tasks assessed/performed             Past Medical History:  Diagnosis Date   Anemia    Arthritis    back and knees   ARTHRITIS, KNEE 09/17/2007   Asthma 04/04/2010   pt states she does not have asthma   Cataract 01/07/2019   Closed fracture of distal end of left radius 11/01/2015   Complete deafness    Meningitis at age 49   Deaf    Diabetes mellitus    Diabetic neuropathy    Ganglion cyst 06/22/2011   Gastroparesis    GERD (gastroesophageal reflux disease)    H/O: C-section    Hyperlipidemia    Hypertension    Neuromuscular disorder    diabetic neuropathy   PSVT (paroxysmal supraventricular tachycardia) 11/09/2017   Event monitor 09/14/2017 - Predominantly sinus rhythm with episodes of narrow-complex tachycardia suggestive of paroxysmal supraventricular tachycardia.   S/P appy    Past Surgical History:  Procedure Laterality Date   APPENDECTOMY     cardiolyte EF 77%, no ischemia in 2006  2006   CARPAL TUNNEL RELEASE Right 04/20/2015   Procedure: RIGHT CARPAL TUNNEL RELEASE;  Surgeon: Cindee Salt, MD;  Location: [facility];  Service: Orthopedics;  Laterality: Right;   CARPAL TUNNEL RELEASE Left 11/04/2015   Procedure: CARPAL TUNNEL RELEASE;  Surgeon: Cindee Salt, MD;  Location: [facility];  Service: Orthopedics;  Laterality: Left;   CESAREAN SECTION     OPEN REDUCTION INTERNAL FIXATION (ORIF) DISTAL RADIAL FRACTURE Left 11/04/2015   Procedure: OPEN REDUCTION INTERNAL FIXATION  (ORIF) LEFT DISTAL RADIAL FRACTURE POSSIBLE BONE GRAFT;  Surgeon: Cindee Salt, MD;  Location: [facility];  Service: Orthopedics;  Laterality: Left;   POSTERIOR CERVICAL FUSION/FORAMINOTOMY N/A 12/26/2021   Procedure: Posterior cervical fusion with lateral mass fixation - Cervical Three-Cervical Six, Cervical Laminectomy Cervical Three-Cervica; Five;  Surgeon: Tia Alert, MD;  Location: [facility] OR;  Service: Neurosurgery;  Laterality: N/A;   TRIGGER FINGER RELEASE Right 04/20/2015   Procedure: RELEASE TRIGGER FINGER/A-1 PULLEY RIGHT MIDDLE FINGER,RIGHT RING FINGER;  Surgeon: Cindee Salt, MD;  Location: [facility];  Service: Orthopedics;  Laterality: Right;   TUBAL LIGATION     ULNAR NERVE TRANSPOSITION Right 04/20/2015   Procedure: RIGHT ULNAR NERVE DECOMPRESSION;  Surgeon: Cindee Salt, MD;  Location: [facility];  Service: Orthopedics;  Laterality: Right;   UTERINE FIBROID EMBOLIZATION  2007   WRIST SURGERY     Cyst removed on left   WRIST SURGERY Right 1985   tendon repair R wrist   Patient Active Problem List   Diagnosis Date Noted   S/P appy 03/20/2022   Neuromuscular disorder 03/20/2022   H/O: C-section 03/20/2022   Anemia 03/20/2022   Arthritis 03/20/2022   S/P cervical spinal fusion 12/26/2021   Cervical disc disorder with radiculopathy of cervical region 10/28/2021   Sternocleidomastoid muscle tenderness 09/26/2021   Musculoskeletal disorder  involving upper trapezius muscle 08/05/2021   Back pain 06/20/2021   Urinary hesitancy 05/13/2021   Hemorrhoids 02/03/2021   Irritable bowel syndrome 11/22/2020   Acute viral sinusitis 11/12/2020   Bilateral sciatica 10/27/2020   Greater trochanteric bursitis of both hips 10/27/2020   Chronic idiopathic constipation 06/25/2020   Elevated blood sugar 06/10/2020   Hernia of abdominal wall 01/28/2020   Spinal stenosis at L4-L5 level 01/12/2020   Transient neurologic deficit 12/22/2019   Frequent  loose stools 04/20/2019   Hyperthyroidism 03/07/2019   Hypertension 01/07/2019   Hypercalcemia 10/08/2018   Seasonal allergies 08/27/2018   Osteoarthritis involving multiple joints on both sides of body 05/25/2018   Statin intolerance 12/26/2017   PSVT (paroxysmal supraventricular tachycardia) 11/09/2017   Chest pain 08/30/2017   Hyperlipidemia 08/30/2017   OSA (obstructive sleep apnea) 08/31/2016   Healthcare maintenance 03/07/2016   ARTHRITIS, KNEE 11/01/2015   Diabetes 09/14/2015   Muscle pain 09/28/2014   Seizure 06/08/2014   Iron deficiency anemia 07/01/2012   Diabetic retinopathy 07/21/2011   GERD 12/30/2007   Diabetic polyneuropathy 08/30/2006   Type 2 diabetes mellitus with ophthalmic complication 08/30/2006   HYPERCHOLESTEROLEMIA 08/30/2006   Hearing loss 08/30/2006    PCP: Barbaraann Faster, MD  REFERRING PROVIDER: Carola Frost, MD  REFERRING DIAG: left humeral fracture  THERAPY DIAG:  Acute pain of left shoulder  Stiffness of left shoulder, not elsewhere classified  Rationale for Evaluation and Treatment: Rehabilitation  ONSET DATE: 09/20/22  SUBJECTIVE:                                                                                                                                                                                      SUBJECTIVE STATEMENT: Patient had a fall 09/20/22, fractured the shoulder left proximal humerus, no surgery, in sling.  She is right hand dominant.  She reports no surgery, just very painful, difficulty cutting food, dressing Hand dominance: Right  PERTINENT HISTORY: See above  PAIN:  Are you having pain? Yes: NPRS scale: 0/10 Pain location: left shoulder to the left elbow Pain description: very sharp electric pain Aggravating factors: any motions will increase pain up to 10/10 Relieving factors: in sling, not moving it pain can be 0/10  PRECAUTIONS: None  WEIGHT BEARING RESTRICTIONS: No  FALLS:  Has patient fallen in last 6 months? Yes.  Number of falls 1  LIVING ENVIRONMENT: Lives with: lives with their family Lives in: House/apartment Stairs: Yes: Internal: 26 steps; can reach both Has following equipment at home: None  OCCUPATION: retired  PLOF: Independent and with all ADL's  PATIENT GOALS: dress independently, less pain, lift a baby in May, drive  NEXT MD VISIT: 10/10/79  OBJECTIVE:   DIAGNOSTIC FINDINGS:  Comminuted humeral fracture  PATIENT SURVEYS :  FOTO 16  COGNITION: Overall cognitive status: Within functional limits for tasks assessed     SENSATION: WFL's  POSTURE: Fwd head, rounded shoulders  UPPER EXTREMITY ROM: Severe pain in the left shoulder with active motions  Passive ROM Left PROM eval Left AROM eval  Shoulder flexion 85 20  Shoulder extension    Shoulder abduction    Shoulder adduction    Shoulder internal rotation 30 30  Shoulder external rotation 15 5  Elbow flexion 130 30  Elbow extension 5 514  Wrist flexion    Wrist extension    Wrist ulnar deviation    Wrist radial deviation    Wrist pronation    Wrist supination    (Blank rows = not tested)  UPPER EXTREMITY MMT:  NOT TESTED at EVAL  MMT Right eval Left eval  Shoulder flexion    Shoulder extension    Shoulder abduction    Shoulder adduction    Shoulder internal rotation    Shoulder external rotation    Middle trapezius    Lower trapezius    Elbow flexion    Elbow extension    Wrist flexion    Wrist extension    Wrist ulnar deviation    Wrist radial deviation    Wrist pronation    Wrist supination    Grip strength (lbs)    (Blank rows = not tested)  PALPATION:  Very tight and tender in the left upper trap and the left upper arm   TODAY'S TREATMENT:                                                                                                                                         DATE:   PATIENT EDUCATION: Education details: HEP/POC Person educated: Patient Education method:  Programmer, multimedia, Facilities manager, Actor cues, Verbal cues, and Handouts Education comprehension: verbalized understanding  HOME EXERCISE PROGRAM: Access Code: [email or web address] URL: [email or web address] Date: 10/16/2022 Prepared by: Stacie Glaze  Exercises - Supine Shoulder Flexion AAROM with Hands Clasped  - 2 x daily - 7 x weekly - 1 sets - 10 reps - 3 hold - Seated Shoulder Flexion Towel Slide at Table Top  - 2 x daily - 7 x weekly - 1 sets - 10 reps - 3 hold - Seated Elbow Extension and Shoulder External Rotation AAROM at Table with Towel  - 2 x daily - 7 x weekly - 1 sets - 10 reps - 3 hold - Circular Shoulder Pendulum with Table Support  - 2 x daily - 7 x weekly - 1 sets - 10 reps - 1 hold - Standing Elbow Flexion Extension AROM  - 2 x daily - 7 x weekly - 1 sets - 10 reps - 3 hold - Seated Shoulder Shrug Circles AROM Backward  - 2 x daily - 7 x  weekly - 1 sets - 10 reps - 3 hold - Seated Scapular Retraction  - 2 x daily - 7 x weekly - 1 sets - 10 reps - 3 hold  ASSESSMENT:  CLINICAL IMPRESSION: Patient is a 67 y.o. female who was seen today for physical therapy evaluation and treatment for left humerus fracture.  Has been in a sling doing only pendulums,. Can now progress to AAROM/PROM. She is very limited with any motions due to fear and pain, has difficulty relaxing and allowing PROM, when she allows PROM she does well but then will tense up on the eccentric part.     OBJECTIVE IMPAIRMENTS: decreased activity tolerance, decreased coordination, decreased endurance, decreased ROM, decreased strength, increased edema, increased muscle spasms, impaired flexibility, impaired UE functional use, improper body mechanics, postural dysfunction, and pain.   REHAB POTENTIAL: Good  CLINICAL DECISION MAKING: Evolving/moderate complexity  EVALUATION COMPLEXITY: Low  GOALS: Goals reviewed with patient? Yes  SHORT TERM GOALS: Target date: 10/30/22  Independent with initial  HEP Goal status: INITIAL  LONG TERM GOALS: Target date: 01/29/23  Independent with advanced HEP Goal status: INITIAL  2.  Do hair without difficulty Goal status: INITIAL  3.  Dress without difficulty Goal status: INITIAL  4.  Decrease pain with motions by 50% Goal status: INITIAL  5.  Increase AROM of the left shoulder to 130 degrees flexion Goal status: INITIAL  PLAN: PT FREQUENCY: 1-2x/week  PT DURATION: 12 weeks  PLANNED INTERVENTIONS: Therapeutic exercises, Therapeutic activity, Neuromuscular re-education, Balance training, Gait training, Patient/Family education, Self Care, Joint mobilization, Dry Needling, Electrical stimulation, Cryotherapy, Moist heat, Taping, Vasopneumatic device, and Manual therapy  PLAN FOR NEXT SESSION: Lenda Kelp, PT 10/16/2022, 10:59 AM

## 2022-10-18 DIAGNOSIS — S42232D 3-part fracture of surgical neck of left humerus, subsequent encounter for fracture with routine healing: Secondary | ICD-10-CM | POA: Diagnosis not present

## 2022-10-20 ENCOUNTER — Ambulatory Visit: Payer: Medicare Other | Admitting: Physical Therapy

## 2022-10-20 DIAGNOSIS — M25512 Pain in left shoulder: Secondary | ICD-10-CM

## 2022-10-20 DIAGNOSIS — M6281 Muscle weakness (generalized): Secondary | ICD-10-CM | POA: Diagnosis not present

## 2022-10-20 DIAGNOSIS — M25612 Stiffness of left shoulder, not elsewhere classified: Secondary | ICD-10-CM

## 2022-10-20 NOTE — Therapy (Signed)
OUTPATIENT PHYSICAL THERAPY UPPER EXTREMITY EVALUATION   Patient Name: Alicia [name] MRN: [id number] DOB:08-Dec-1955, 67 y.o., female Today's Date: 10/20/2022  END OF SESSION:  PT End of Session - 10/20/22 1011     Visit Number 2    Date for PT Re-Evaluation 01/15/23    Authorization Type BCBS    PT Start Time 1015    PT Stop Time 1100    PT Time Calculation (min) 45 min             Past Medical History:  Diagnosis Date   Anemia    Arthritis    back and knees   ARTHRITIS, KNEE 09/17/2007   Asthma 04/04/2010   pt states she does not have asthma   Cataract 01/07/2019   Closed fracture of distal end of left radius 11/01/2015   Complete deafness    Meningitis at age 30   Deaf    Diabetes mellitus    Diabetic neuropathy    Ganglion cyst 06/22/2011   Gastroparesis    GERD (gastroesophageal reflux disease)    H/O: C-section    Hyperlipidemia    Hypertension    Neuromuscular disorder    diabetic neuropathy   PSVT (paroxysmal supraventricular tachycardia) 11/09/2017   Event monitor 09/14/2017 - Predominantly sinus rhythm with episodes of narrow-complex tachycardia suggestive of paroxysmal supraventricular tachycardia.   S/P appy    Past Surgical History:  Procedure Laterality Date   APPENDECTOMY     cardiolyte EF 77%, no ischemia in 2006  2006   CARPAL TUNNEL RELEASE Right 04/20/2015   Procedure: RIGHT CARPAL TUNNEL RELEASE;  Surgeon: Cindee Salt, MD;  Location: [facility];  Service: Orthopedics;  Laterality: Right;   CARPAL TUNNEL RELEASE Left 11/04/2015   Procedure: CARPAL TUNNEL RELEASE;  Surgeon: Cindee Salt, MD;  Location: [facility];  Service: Orthopedics;  Laterality: Left;   CESAREAN SECTION     OPEN REDUCTION INTERNAL FIXATION (ORIF) DISTAL RADIAL FRACTURE Left 11/04/2015   Procedure: OPEN REDUCTION INTERNAL FIXATION (ORIF) LEFT DISTAL RADIAL FRACTURE POSSIBLE BONE GRAFT;  Surgeon: Cindee Salt, MD;  Location: [facility];   Service: Orthopedics;  Laterality: Left;   POSTERIOR CERVICAL FUSION/FORAMINOTOMY N/A 12/26/2021   Procedure: Posterior cervical fusion with lateral mass fixation - Cervical Three-Cervical Six, Cervical Laminectomy Cervical Three-Cervica; Five;  Surgeon: Tia Alert, MD;  Location: [facility];  Service: Neurosurgery;  Laterality: N/A;   TRIGGER FINGER RELEASE Right 04/20/2015   Procedure: RELEASE TRIGGER FINGER/A-1 PULLEY RIGHT MIDDLE FINGER,RIGHT RING FINGER;  Surgeon: Cindee Salt, MD;  Location: [facility];  Service: Orthopedics;  Laterality: Right;   TUBAL LIGATION     ULNAR NERVE TRANSPOSITION Right 04/20/2015   Procedure: RIGHT ULNAR NERVE DECOMPRESSION;  Surgeon: Cindee Salt, MD;  Location: [facility];  Service: Orthopedics;  Laterality: Right;   UTERINE FIBROID EMBOLIZATION  2007   WRIST SURGERY     Cyst removed on left   WRIST SURGERY Right 1985   tendon repair R wrist   Patient Active Problem List   Diagnosis Date Noted   S/P appy 03/20/2022   Neuromuscular disorder 03/20/2022   H/O: C-section 03/20/2022   Anemia 03/20/2022   Arthritis 03/20/2022   S/P cervical spinal fusion 12/26/2021   Cervical disc disorder with radiculopathy of cervical region 10/28/2021   Sternocleidomastoid muscle tenderness 09/26/2021   Musculoskeletal disorder involving upper trapezius muscle 08/05/2021   Back pain 06/20/2021   Urinary hesitancy 05/13/2021   Hemorrhoids 02/03/2021  Irritable bowel syndrome 11/22/2020   Acute viral sinusitis 11/12/2020   Bilateral sciatica 10/27/2020   Greater trochanteric bursitis of both hips 10/27/2020   Chronic idiopathic constipation 06/25/2020   Elevated blood sugar 06/10/2020   Hernia of abdominal wall 01/28/2020   Spinal stenosis at L4-L5 level 01/12/2020   Transient neurologic deficit 12/22/2019   Frequent loose stools 04/20/2019   Hyperthyroidism 03/07/2019   Hypertension 01/07/2019   Hypercalcemia 10/08/2018   Seasonal  allergies 08/27/2018   Osteoarthritis involving multiple joints on both sides of body 05/25/2018   Statin intolerance 12/26/2017   PSVT (paroxysmal supraventricular tachycardia) 11/09/2017   Chest pain 08/30/2017   Hyperlipidemia 08/30/2017   OSA (obstructive sleep apnea) 08/31/2016   Healthcare maintenance 03/07/2016   ARTHRITIS, KNEE 11/01/2015   Diabetes 09/14/2015   Muscle pain 09/28/2014   Seizure 06/08/2014   Iron deficiency anemia 07/01/2012   Diabetic retinopathy 07/21/2011   GERD 12/30/2007   Diabetic polyneuropathy 08/30/2006   Type 2 diabetes mellitus with ophthalmic complication 08/30/2006   HYPERCHOLESTEROLEMIA 08/30/2006   Hearing loss 08/30/2006    PCP: Barbaraann Faster, MD  REFERRING PROVIDER: Carola Frost, MD  REFERRING DIAG: left humeral fracture  THERAPY DIAG:  Acute pain of left shoulder  Stiffness of left shoulder, not elsewhere classified  Rationale for Evaluation and Treatment: Rehabilitation  ONSET DATE: 09/20/22  SUBJECTIVE:                                                                                                                                                                                      SUBJECTIVE STATEMENT:  pt reports doing better, doing HEP and can nt put earrings on and tie shoe which she had not been able to do. Pain varies on actvitiy/mvmt PERTINENT HISTORY: See above  PAIN:  Are you having pain? Yes: NPRS scale: 0-6/10 Pain location: left shoulder to the left elbow Pain description: very sharp electric pain Aggravating factors: any motions will increase pain up to 10/10 Relieving factors: in sling, not moving it pain can be 0/10  PRECAUTIONS: None  WEIGHT BEARING RESTRICTIONS: No  FALLS:  Has patient fallen in last 6 months? Yes. Number of falls 1  LIVING ENVIRONMENT: Lives with: lives with their family Lives in: House/apartment Stairs: Yes: Internal: 26 steps; can reach both Has following equipment at home:  None  OCCUPATION: retired  PLOF: Independent and with all ADL's  PATIENT GOALS: dress independently, less pain, lift a baby in May, drive  NEXT MD VISIT: 1/61/09  OBJECTIVE:   DIAGNOSTIC FINDINGS:  Comminuted humeral fracture  PATIENT SURVEYS :  FOTO 16  COGNITION: Overall cognitive status: Within functional limits for tasks assessed  SENSATION: WFL's  POSTURE: Fwd head, rounded shoulders  UPPER EXTREMITY ROM: Severe pain in the left shoulder with active motions  Passive ROM Left PROM eval Left AROM eval Left Standing  10/30/22  Shoulder flexion 85 20 70  Shoulder extension     Shoulder abduction     Shoulder adduction     Shoulder internal rotation 30 30 63  Shoulder external rotation 15 5   Elbow flexion 130 30   Elbow extension 5 514   Wrist flexion     Wrist extension     Wrist ulnar deviation     Wrist radial deviation     Wrist pronation     Wrist supination     (Blank rows = not tested)  UPPER EXTREMITY MMT:  NOT TESTED at EVAL  MMT Right eval Left eval  Shoulder flexion    Shoulder extension    Shoulder abduction    Shoulder adduction    Shoulder internal rotation    Shoulder external rotation    Middle trapezius    Lower trapezius    Elbow flexion    Elbow extension    Wrist flexion    Wrist extension    Wrist ulnar deviation    Wrist radial deviation    Wrist pronation    Wrist supination    Grip strength (lbs)    (Blank rows = not tested)  PALPATION:  Very tight and tender in the left upper trap and the left upper arm   TODAY'S TREATMENT:                                                                                                                                         DATE:   10/20/22 UBE L 1 2 min fwd, 1 min backward with pain Finger ladder flex and bad 5 x each Rolling ball on mat 4 ways 10 x each Standing 3# cane bicep 10 x Standing cane ex no wt for shld upright row, chest press, ER, IR and shld ext 10 x  each Supine cane chest press, shld flex ,ER and abd 10 x each PROM LEft shld supine   Eval  PATIENT EDUCATION: Education details: HEP/POC Person educated: Patient Education method: Programmer, multimedia, Facilities manager, Actor cues, Verbal cues, and Handouts Education comprehension: verbalized understanding  HOME EXERCISE PROGRAM: Access Code: [email or web address] URL: [email or web address] Date: 10/16/2022 Prepared by: Stacie Glaze  Exercises - Supine Shoulder Flexion AAROM with Hands Clasped  - 2 x daily - 7 x weekly - 1 sets - 10 reps - 3 hold - Seated Shoulder Flexion Towel Slide at Table Top  - 2 x daily - 7 x weekly - 1 sets - 10 reps - 3 hold - Seated Elbow Extension and Shoulder External Rotation AAROM at Table with Towel  - 2 x daily - 7 x weekly - 1 sets - 10 reps - 3 hold - Circular Shoulder  Pendulum with Table Support  - 2 x daily - 7 x weekly - 1 sets - 10 reps - 1 hold - Standing Elbow Flexion Extension AROM  - 2 x daily - 7 x weekly - 1 sets - 10 reps - 3 hold - Seated Shoulder Shrug Circles AROM Backward  - 2 x daily - 7 x weekly - 1 sets - 10 reps - 3 hold - Seated Scapular Retraction  - 2 x daily - 7 x weekly - 1 sets - 10 reps - 3 hold  ASSESSMENT:  CLINICAL IMPRESSION: Pt arrives feeling better and able to use alittle more.  Progressed ex slowly with some pain but overall tolerated well. Cuing with PROM tp relax. All ex and ROM tp tolerance   OBJECTIVE IMPAIRMENTS: decreased activity tolerance, decreased coordination, decreased endurance, decreased ROM, decreased strength, increased edema, increased muscle spasms, impaired flexibility, impaired UE functional use, improper body mechanics, postural dysfunction, and pain.   REHAB POTENTIAL: Good  CLINICAL DECISION MAKING: Evolving/moderate complexity  EVALUATION COMPLEXITY: Low  GOALS: Goals reviewed with patient? Yes  SHORT TERM GOALS: Target date: 10/30/22  Independent with initial HEP Goal status:  10/20/22 MET  LONG TERM GOALS: Target date: 01/29/23  Independent with advanced HEP Goal status: INITIAL  2.  Do hair without difficulty Goal status: INITIAL  3.  Dress without difficulty Goal status: INITIAL  4.  Decrease pain with motions by 50% Goal status: INITIAL  5.  Increase AROM of the left shoulder to 130 degrees flexion Goal status: INITIAL  PLAN: PT FREQUENCY: 1-2x/week  PT DURATION: 12 weeks  PLANNED INTERVENTIONS: Therapeutic exercises, Therapeutic activity, Neuromuscular re-education, Balance training, Gait training, Patient/Family education, Self Care, Joint mobilization, Dry Needling, Electrical stimulation, Cryotherapy, Moist heat, Taping, Vasopneumatic device, and Manual therapy  PLAN FOR NEXT SESSION: PROM/AAROM. Look to increase HEP as needed   Marlina Cataldi,ANGIE, PTA 10/20/2022, 10:12 AM [place] [facility] [address] [facility] [place], [place], [address] Phone: [phone]   Fax:  [phone]  Patient Details  Name: SHIORI ADCOX MRN: [id number] Date of Birth: 08-04-1955 Referring Provider:  Bess Kinds, MD  Encounter Date: 10/20/2022   Suanne Marker, PTA 10/20/2022, 10:12 AM  [place] [facility] [place], [place], [address] Phone: [phone]   Fax:  [phone]

## 2022-10-24 ENCOUNTER — Ambulatory Visit: Payer: Medicare Other | Admitting: Physical Therapy

## 2022-10-24 DIAGNOSIS — M6281 Muscle weakness (generalized): Secondary | ICD-10-CM

## 2022-10-24 DIAGNOSIS — M25612 Stiffness of left shoulder, not elsewhere classified: Secondary | ICD-10-CM

## 2022-10-24 DIAGNOSIS — M25512 Pain in left shoulder: Secondary | ICD-10-CM

## 2022-10-24 NOTE — Therapy (Signed)
OUTPATIENT PHYSICAL THERAPY UPPER EXTREMITY    Patient Name: Alicia [name] MRN: [id number] DOB:06-Oct-1955, 67 y.o., female Today's Date: 10/24/2022  END OF SESSION:  PT End of Session - 10/24/22 0758     Visit Number 3    Date for PT Re-Evaluation 01/15/23    PT Start Time 0758    PT Stop Time 0845    PT Time Calculation (min) 47 min             Past Medical History:  Diagnosis Date   Anemia    Arthritis    back and knees   ARTHRITIS, KNEE 09/17/2007   Asthma 04/04/2010   pt states she does not have asthma   Cataract 01/07/2019   Closed fracture of distal end of left radius 11/01/2015   Complete deafness    Meningitis at age 29   Deaf    Diabetes mellitus    Diabetic neuropathy    Ganglion cyst 06/22/2011   Gastroparesis    GERD (gastroesophageal reflux disease)    H/O: C-section    Hyperlipidemia    Hypertension    Neuromuscular disorder    diabetic neuropathy   PSVT (paroxysmal supraventricular tachycardia) 11/09/2017   Event monitor 09/14/2017 - Predominantly sinus rhythm with episodes of narrow-complex tachycardia suggestive of paroxysmal supraventricular tachycardia.   S/P appy    Past Surgical History:  Procedure Laterality Date   APPENDECTOMY     cardiolyte EF 77%, no ischemia in 2006  2006   CARPAL TUNNEL RELEASE Right 04/20/2015   Procedure: RIGHT CARPAL TUNNEL RELEASE;  Surgeon: Cindee Salt, MD;  Location: [facility];  Service: Orthopedics;  Laterality: Right;   CARPAL TUNNEL RELEASE Left 11/04/2015   Procedure: CARPAL TUNNEL RELEASE;  Surgeon: Cindee Salt, MD;  Location: [facility];  Service: Orthopedics;  Laterality: Left;   CESAREAN SECTION     OPEN REDUCTION INTERNAL FIXATION (ORIF) DISTAL RADIAL FRACTURE Left 11/04/2015   Procedure: OPEN REDUCTION INTERNAL FIXATION (ORIF) LEFT DISTAL RADIAL FRACTURE POSSIBLE BONE GRAFT;  Surgeon: Cindee Salt, MD;  Location: [facility];  Service: Orthopedics;  Laterality:  Left;   POSTERIOR CERVICAL FUSION/FORAMINOTOMY N/A 12/26/2021   Procedure: Posterior cervical fusion with lateral mass fixation - Cervical Three-Cervical Six, Cervical Laminectomy Cervical Three-Cervica; Five;  Surgeon: Tia Alert, MD;  Location: [facility] OR;  Service: Neurosurgery;  Laterality: N/A;   TRIGGER FINGER RELEASE Right 04/20/2015   Procedure: RELEASE TRIGGER FINGER/A-1 PULLEY RIGHT MIDDLE FINGER,RIGHT RING FINGER;  Surgeon: Cindee Salt, MD;  Location: [facility];  Service: Orthopedics;  Laterality: Right;   TUBAL LIGATION     ULNAR NERVE TRANSPOSITION Right 04/20/2015   Procedure: RIGHT ULNAR NERVE DECOMPRESSION;  Surgeon: Cindee Salt, MD;  Location: [facility];  Service: Orthopedics;  Laterality: Right;   UTERINE FIBROID EMBOLIZATION  2007   WRIST SURGERY     Cyst removed on left   WRIST SURGERY Right 1985   tendon repair R wrist   Patient Active Problem List   Diagnosis Date Noted   S/P appy 03/20/2022   Neuromuscular disorder 03/20/2022   H/O: C-section 03/20/2022   Anemia 03/20/2022   Arthritis 03/20/2022   S/P cervical spinal fusion 12/26/2021   Cervical disc disorder with radiculopathy of cervical region 10/28/2021   Sternocleidomastoid muscle tenderness 09/26/2021   Musculoskeletal disorder involving upper trapezius muscle 08/05/2021   Back pain 06/20/2021   Urinary hesitancy 05/13/2021   Hemorrhoids 02/03/2021   Irritable bowel syndrome 11/22/2020  Acute viral sinusitis 11/12/2020   Bilateral sciatica 10/27/2020   Greater trochanteric bursitis of both hips 10/27/2020   Chronic idiopathic constipation 06/25/2020   Elevated blood sugar 06/10/2020   Hernia of abdominal wall 01/28/2020   Spinal stenosis at L4-L5 level 01/12/2020   Transient neurologic deficit 12/22/2019   Frequent loose stools 04/20/2019   Hyperthyroidism 03/07/2019   Hypertension 01/07/2019   Hypercalcemia 10/08/2018   Seasonal allergies 08/27/2018   Osteoarthritis  involving multiple joints on both sides of body 05/25/2018   Statin intolerance 12/26/2017   PSVT (paroxysmal supraventricular tachycardia) 11/09/2017   Chest pain 08/30/2017   Hyperlipidemia 08/30/2017   OSA (obstructive sleep apnea) 08/31/2016   Healthcare maintenance 03/07/2016   ARTHRITIS, KNEE 11/01/2015   Diabetes 09/14/2015   Muscle pain 09/28/2014   Seizure 06/08/2014   Iron deficiency anemia 07/01/2012   Diabetic retinopathy 07/21/2011   GERD 12/30/2007   Diabetic polyneuropathy 08/30/2006   Type 2 diabetes mellitus with ophthalmic complication 08/30/2006   HYPERCHOLESTEROLEMIA 08/30/2006   Hearing loss 08/30/2006    PCP: Barbaraann Faster, MD  REFERRING PROVIDER: Carola Frost, MD  REFERRING DIAG: left humeral fracture  THERAPY DIAG:  Acute pain of left shoulder  Stiffness of left shoulder, not elsewhere classified  Muscle weakness  Rationale for Evaluation and Treatment: Rehabilitation  ONSET DATE: 09/20/22  SUBJECTIVE:                                                                                                                                                                                      SUBJECTIVE STATEMENT:  okay after last session but took medicine. Slowly getting better. Sleeping is bad and pain down to wrist   PERTINENT HISTORY: See above  PAIN:  Are you having pain? Yes: NPRS scale: 0-6/10 Pain location: left shoulder to the left elbow Pain description: very sharp electric pain Aggravating factors: any motions will increase pain up to 10/10 Relieving factors: in sling, not moving it pain can be 0/10  PRECAUTIONS: None  WEIGHT BEARING RESTRICTIONS: No  FALLS:  Has patient fallen in last 6 months? Yes. Number of falls 1  LIVING ENVIRONMENT: Lives with: lives with their family Lives in: House/apartment Stairs: Yes: Internal: 26 steps; can reach both Has following equipment at home: None  OCCUPATION: retired  PLOF: Independent and with all  ADL's  PATIENT GOALS: dress independently, less pain, lift a baby in May, drive  NEXT MD VISIT: 1/61/09  OBJECTIVE:   DIAGNOSTIC FINDINGS:  Comminuted humeral fracture  PATIENT SURVEYS :  FOTO 16  COGNITION: Overall cognitive status: Within functional limits for tasks assessed     SENSATION: WFL's  POSTURE: Fwd head, rounded shoulders  UPPER EXTREMITY ROM: Severe pain in the left shoulder with active motions  Passive ROM Left PROM eval Left AROM eval Left Standing  10/30/22  Shoulder flexion 85 20 70  Shoulder extension     Shoulder abduction     Shoulder adduction     Shoulder internal rotation 30 30 63  Shoulder external rotation 15 5   Elbow flexion 130 30   Elbow extension 5 514   Wrist flexion     Wrist extension     Wrist ulnar deviation     Wrist radial deviation     Wrist pronation     Wrist supination     (Blank rows = not tested)  UPPER EXTREMITY MMT:  NOT TESTED at EVAL  MMT Right eval Left eval  Shoulder flexion    Shoulder extension    Shoulder abduction    Shoulder adduction    Shoulder internal rotation    Shoulder external rotation    Middle trapezius    Lower trapezius    Elbow flexion    Elbow extension    Wrist flexion    Wrist extension    Wrist ulnar deviation    Wrist radial deviation    Wrist pronation    Wrist supination    Grip strength (lbs)    (Blank rows = not tested)  PALPATION:  Very tight and tender in the left upper trap and the left upper arm   TODAY'S TREATMENT:                                                                                                                                         DATE:   10/24/22 UBE L 1 2 min fwd, 1 min backward Finger ladder flex and Abd 5 x each Rolling ball on mat 4 ways 10 x each Supine cane chest press, shld flex ,ER and abd 10 x each Supine isometrics PROM Left shld supine VASO Left shld for pain control after session  10/20/22 UBE L 1 2 min fwd, 1 min backward  with pain Finger ladder flex and bad 5 x each Rolling ball on mat 4 ways 10 x each Standing 3# cane bicep 10 x Standing cane ex no wt for shld upright row, chest press, ER, IR and shld ext 10 x each Supine cane chest press, shld flex ,ER and abd 10 x each PROM LEft shld supine   Eval  PATIENT EDUCATION: Education details: HEP/POC Person educated: Patient Education method: Programmer, multimedia, Facilities manager, Actor cues, Verbal cues, and Handouts Education comprehension: verbalized understanding  HOME EXERCISE PROGRAM: Access Code: [email or web address] URL: [email or web address] Date: 10/16/2022 Prepared by: Stacie Glaze  Exercises - Supine Shoulder Flexion AAROM with Hands Clasped  - 2 x daily - 7 x weekly - 1 sets - 10 reps - 3 hold - Seated Shoulder Flexion Towel Slide at Table Top  - 2 x daily - 7  x weekly - 1 sets - 10 reps - 3 hold - Seated Elbow Extension and Shoulder External Rotation AAROM at Table with Towel  - 2 x daily - 7 x weekly - 1 sets - 10 reps - 3 hold - Circular Shoulder Pendulum with Table Support  - 2 x daily - 7 x weekly - 1 sets - 10 reps - 1 hold - Standing Elbow Flexion Extension AROM  - 2 x daily - 7 x weekly - 1 sets - 10 reps - 3 hold - Seated Shoulder Shrug Circles AROM Backward  - 2 x daily - 7 x weekly - 1 sets - 10 reps - 3 hold - Seated Scapular Retraction  - 2 x daily - 7 x weekly - 1 sets - 10 reps - 3 hold  ASSESSMENT:  CLINICAL IMPRESSION: Pt demo increased AA ROM with ex. Still some pain limitations and cuing to relax with ex and PROM OBJECTIVE IMPAIRMENTS: decreased activity tolerance, decreased coordination, decreased endurance, decreased ROM, decreased strength, increased edema, increased muscle spasms, impaired flexibility, impaired UE functional use, improper body mechanics, postural dysfunction, and pain.   REHAB POTENTIAL: Good  CLINICAL DECISION MAKING: Evolving/moderate complexity  EVALUATION COMPLEXITY: Low  GOALS: Goals  reviewed with patient? Yes  SHORT TERM GOALS: Target date: 10/30/22  Independent with initial HEP Goal status: 10/20/22 MET  LONG TERM GOALS: Target date: 01/29/23  Independent with advanced HEP Goal status: INITIAL  2.  Do hair without difficulty Goal status: INITIAL  3.  Dress without difficulty Goal status: INITIAL  4.  Decrease pain with motions by 50% Goal status: INITIAL  5.  Increase AROM of the left shoulder to 130 degrees flexion Goal status: INITIAL  PLAN: PT FREQUENCY: 1-2x/week  PT DURATION: 12 weeks  PLANNED INTERVENTIONS: Therapeutic exercises, Therapeutic activity, Neuromuscular re-education, Balance training, Gait training, Patient/Family education, Self Care, Joint mobilization, Dry Needling, Electrical stimulation, Cryotherapy, Moist heat, Taping, Vasopneumatic device, and Manual therapy  PLAN FOR NEXT SESSION: PROM/AAROM. Look to increase HEP as needed   Kyllian Clingerman,ANGIE, PTA 10/24/2022, 8:01 AM [facility] at [facility] [address] [facility] [place], [place], [address] Phone: [phone]   Fax:  [phone]  Patient Details  Name: SHARNE LINDERS MRN: [id number] Date of Birth: February 27, 1956 Referring Provider:  Bess Kinds, MD  Encounter Date: 10/24/2022   Suanne Marker, PTA 10/24/2022, 8:01 AM  [facility] [place] [facility] at [facility] [address] [place], [place], [address] Phone: [phone]   Fax:  [phone] at [facility] [address]. [place], [place], [address] Phone: [phone]   Fax:  [phone]  Patient Details  Name: XARENI KELCH MRN: [id number] Date of Birth: 1955-12-26 Referring Provider:  Bess Kinds, MD  Encounter Date: 10/24/2022   Suanne Marker, PTA 10/24/2022, 8:01 AM  [facility] at [facility] [address] [place], [place], [address] Phone:  [phone]   Fax:  [phone]

## 2022-10-27 ENCOUNTER — Ambulatory Visit: Payer: Medicare Other | Admitting: Physical Therapy

## 2022-10-27 ENCOUNTER — Encounter: Payer: Self-pay | Admitting: Physical Therapy

## 2022-10-27 DIAGNOSIS — M25612 Stiffness of left shoulder, not elsewhere classified: Secondary | ICD-10-CM

## 2022-10-27 DIAGNOSIS — M6281 Muscle weakness (generalized): Secondary | ICD-10-CM | POA: Diagnosis not present

## 2022-10-27 DIAGNOSIS — M25512 Pain in left shoulder: Secondary | ICD-10-CM | POA: Diagnosis not present

## 2022-10-27 NOTE — Therapy (Signed)
OUTPATIENT PHYSICAL THERAPY UPPER EXTREMITY    Patient Name: Alicia [name] MRN: [id number] DOB:[birth year], 67 y.o., female Today's Date: 10/27/2022  END OF SESSION:  PT End of Session - 10/27/22 1018     Visit Number 4    Date for PT Re-Evaluation 01/15/23    Authorization Type [employer]    PT Start Time 1015    PT Stop Time 1110    PT Time Calculation (min) 55 min    Activity Tolerance Patient tolerated treatment well    Behavior During Therapy WFL for tasks assessed/performed             Past Medical History:  Diagnosis Date   Anemia    Arthritis    back and knees   ARTHRITIS, KNEE 09/17/2007   Asthma 04/04/2010   pt states she does not have asthma   Cataract 01/07/2019   Closed fracture of distal end of left radius 11/01/2015   Complete deafness    Meningitis at age 87   Deaf    Diabetes mellitus    Diabetic neuropathy (HCC)    Ganglion cyst 06/22/2011   Gastroparesis    GERD (gastroesophageal reflux disease)    H/O: C-section    Hyperlipidemia    Hypertension    Neuromuscular disorder (HCC)    diabetic neuropathy   PSVT (paroxysmal supraventricular tachycardia) 11/09/2017   Event monitor 09/14/2017 - Predominantly sinus rhythm with episodes of narrow-complex tachycardia suggestive of paroxysmal supraventricular tachycardia.   S/P appy    Past Surgical History:  Procedure Laterality Date   APPENDECTOMY     cardiolyte EF 77%, no ischemia in 2006  2006   CARPAL TUNNEL RELEASE Right 04/20/2015   Procedure: RIGHT CARPAL TUNNEL RELEASE;  Surgeon: Cindee Salt, MD;  Location: [facility];  Service: Orthopedics;  Laterality: Right;   CARPAL TUNNEL RELEASE Left 11/04/2015   Procedure: CARPAL TUNNEL RELEASE;  Surgeon: Cindee Salt, MD;  Location: [facility];  Service: Orthopedics;  Laterality: Left;   CESAREAN SECTION     OPEN REDUCTION INTERNAL FIXATION (ORIF) DISTAL RADIAL FRACTURE Left 11/04/2015   Procedure: OPEN REDUCTION INTERNAL FIXATION  (ORIF) LEFT DISTAL RADIAL FRACTURE POSSIBLE BONE GRAFT;  Surgeon: Cindee Salt, MD;  Location: [facility];  Service: Orthopedics;  Laterality: Left;   POSTERIOR CERVICAL FUSION/FORAMINOTOMY N/A 12/26/2021   Procedure: Posterior cervical fusion with lateral mass fixation - Cervical Three-Cervical Six, Cervical Laminectomy Cervical Three-Cervica; Five;  Surgeon: Tia Alert, MD;  Location: [facility];  Service: Neurosurgery;  Laterality: N/A;   TRIGGER FINGER RELEASE Right 04/20/2015   Procedure: RELEASE TRIGGER FINGER/A-1 PULLEY RIGHT MIDDLE FINGER,RIGHT RING FINGER;  Surgeon: Cindee Salt, MD;  Location: [facility];  Service: Orthopedics;  Laterality: Right;   TUBAL LIGATION     ULNAR NERVE TRANSPOSITION Right 04/20/2015   Procedure: RIGHT ULNAR NERVE DECOMPRESSION;  Surgeon: Cindee Salt, MD;  Location: [facility];  Service: Orthopedics;  Laterality: Right;   UTERINE FIBROID EMBOLIZATION  2007   WRIST SURGERY     Cyst removed on left   WRIST SURGERY Right 1985   tendon repair R wrist   Patient Active Problem List   Diagnosis Date Noted   S/P appy 03/20/2022   Neuromuscular disorder (HCC) 03/20/2022   H/O: C-section 03/20/2022   Anemia 03/20/2022   Arthritis 03/20/2022   S/P cervical spinal fusion 12/26/2021   Cervical disc disorder with radiculopathy of cervical region 10/28/2021   Sternocleidomastoid muscle tenderness 09/26/2021  Musculoskeletal disorder involving upper trapezius muscle 08/05/2021   Back pain 06/20/2021   Urinary hesitancy 05/13/2021   Hemorrhoids 02/03/2021   Irritable bowel syndrome 11/22/2020   Acute viral sinusitis 11/12/2020   Bilateral sciatica 10/27/2020   Greater trochanteric bursitis of both hips 10/27/2020   Chronic idiopathic constipation 06/25/2020   Elevated blood sugar 06/10/2020   Hernia of abdominal wall 01/28/2020   Spinal stenosis at L4-L5 level 01/12/2020   Transient neurologic deficit 12/22/2019    Frequent loose stools 04/20/2019   Hyperthyroidism 03/07/2019   Hypertension 01/07/2019   Hypercalcemia 10/08/2018   Seasonal allergies 08/27/2018   Osteoarthritis involving multiple joints on both sides of body 05/25/2018   Statin intolerance 12/26/2017   PSVT (paroxysmal supraventricular tachycardia) 11/09/2017   Chest pain 08/30/2017   Hyperlipidemia 08/30/2017   OSA (obstructive sleep apnea) 08/31/2016   Healthcare maintenance 03/07/2016   ARTHRITIS, KNEE 11/01/2015   Diabetes (HCC) 09/14/2015   Muscle pain 09/28/2014   Seizure (HCC) 06/08/2014   Iron deficiency anemia 07/01/2012   Diabetic retinopathy (HCC) 07/21/2011   GERD 12/30/2007   Diabetic polyneuropathy (HCC) 08/30/2006   Type 2 diabetes mellitus with ophthalmic complication (HCC) 08/30/2006   HYPERCHOLESTEROLEMIA 08/30/2006   Hearing loss 08/30/2006    PCP: Barbaraann Faster, MD  REFERRING PROVIDER: Carola Frost, MD  REFERRING DIAG: left humeral fracture  THERAPY DIAG:  Acute pain of left shoulder  Stiffness of left shoulder, not elsewhere classified  Muscle weakness  Rationale for Evaluation and Treatment: Rehabilitation  ONSET DATE: 09/20/22  SUBJECTIVE:                                                                                                                                                                                      SUBJECTIVE STATEMENT:  Sore, but tried to drive and it really hurt   PERTINENT HISTORY: See above  PAIN:  Are you having pain? Yes: NPRS scale: 0-6/10 Pain location: left shoulder to the left elbow Pain description: very sharp electric pain Aggravating factors: any motions will increase pain up to 10/10 Relieving factors: in sling, not moving it pain can be 0/10  PRECAUTIONS: None  WEIGHT BEARING RESTRICTIONS: No  FALLS:  Has patient fallen in last 6 months? Yes. Number of falls 1  LIVING ENVIRONMENT: Lives with: lives with their family Lives in: House/apartment Stairs: Yes:  Internal: 26 steps; can reach both Has following equipment at home: None  OCCUPATION: retired  PLOF: Independent and with all ADL's  PATIENT GOALS: dress independently, less pain, lift a baby in May, drive  NEXT MD VISIT: 12/04/52  OBJECTIVE:   DIAGNOSTIC FINDINGS:  Comminuted humeral fracture  PATIENT SURVEYS :  FOTO  16  COGNITION: Overall cognitive status: Within functional limits for tasks assessed     SENSATION: WFL's  POSTURE: Fwd head, rounded shoulders  UPPER EXTREMITY ROM: Severe pain in the left shoulder with active motions  Passive ROM Left PROM eval Left AROM eval Left Standing  10/30/22  Shoulder flexion 85 20 70  Shoulder extension     Shoulder abduction     Shoulder adduction     Shoulder internal rotation 30 30 63  Shoulder external rotation 15 5   Elbow flexion 130 30   Elbow extension 5 514   Wrist flexion     Wrist extension     Wrist ulnar deviation     Wrist radial deviation     Wrist pronation     Wrist supination     (Blank rows = not tested)  UPPER EXTREMITY MMT:  NOT TESTED at EVAL  MMT Right eval Left eval  Shoulder flexion    Shoulder extension    Shoulder abduction    Shoulder adduction    Shoulder internal rotation    Shoulder external rotation    Middle trapezius    Lower trapezius    Elbow flexion    Elbow extension    Wrist flexion    Wrist extension    Wrist ulnar deviation    Wrist radial deviation    Wrist pronation    Wrist supination    Grip strength (lbs)    (Blank rows = not tested)  PALPATION:  Very tight and tender in the left upper trap and the left upper arm   TODAY'S TREATMENT:                                                                                                                                         DATE:  10/27/22 UBE level 1 1fw/3bkwd 1# Wate bar biceps, extension Wall slides with assist, circles Ball rolls for flexion and circles Red tband rows and extension Thigh  slides Shrugs ER/IR on sliding board Supine chest press Supine left shoulder isometrics Vaso medium pressure left shoulder  10/24/22 UBE L 1 2 min fwd, 1 min backward Finger ladder flex and Abd 5 x each Rolling ball on mat 4 ways 10 x each Supine cane chest press, shld flex ,ER and abd 10 x each Supine isometrics PROM Left shld supine VASO Left shld for pain control after session  10/20/22 UBE L 1 2 min fwd, 1 min backward with pain Finger ladder flex and bad 5 x each Rolling ball on mat 4 ways 10 x each Standing 3# cane bicep 10 x Standing cane ex no wt for shld upright row, chest press, ER, IR and shld ext 10 x each Supine cane chest press, shld flex ,ER and abd 10 x each PROM LEft shld supine   Eval  PATIENT EDUCATION: Education details: HEP/POC Person educated: Patient Education method: Explanation,  Demonstration, Tactile cues, Verbal cues, and Handouts Education comprehension: verbalized understanding  HOME EXERCISE PROGRAM: Access Code: [email or web address] URL: [email or web address] Date: 10/16/2022 Prepared by: Stacie Glaze  Exercises - Supine Shoulder Flexion AAROM with Hands Clasped  - 2 x daily - 7 x weekly - 1 sets - 10 reps - 3 hold - Seated Shoulder Flexion Towel Slide at Table Top  - 2 x daily - 7 x weekly - 1 sets - 10 reps - 3 hold - Seated Elbow Extension and Shoulder External Rotation AAROM at Table with Towel  - 2 x daily - 7 x weekly - 1 sets - 10 reps - 3 hold - Circular Shoulder Pendulum with Table Support  - 2 x daily - 7 x weekly - 1 sets - 10 reps - 1 hold - Standing Elbow Flexion Extension AROM  - 2 x daily - 7 x weekly - 1 sets - 10 reps - 3 hold - Seated Shoulder Shrug Circles AROM Backward  - 2 x daily - 7 x weekly - 1 sets - 10 reps - 3 hold - Seated Scapular Retraction  - 2 x daily - 7 x weekly - 1 sets - 10 reps - 3 hold  ASSESSMENT:  CLINICAL IMPRESSION: Patient continues to be very guarded, very timid with motions, she is  moving better on some things but still fearful with new things.  We are easing into strength added tband rows and extension, tried ER but could not do this today OBJECTIVE IMPAIRMENTS: decreased activity tolerance, decreased coordination, decreased endurance, decreased ROM, decreased strength, increased edema, increased muscle spasms, impaired flexibility, impaired UE functional use, improper body mechanics, postural dysfunction, and pain.   REHAB POTENTIAL: Good  CLINICAL DECISION MAKING: Evolving/moderate complexity  EVALUATION COMPLEXITY: Low  GOALS: Goals reviewed with patient? Yes  SHORT TERM GOALS: Target date: 10/30/22  Independent with initial HEP Goal status: 10/20/22 MET  LONG TERM GOALS: Target date: 01/29/23  Independent with advanced HEP Goal status: INITIAL  2.  Do hair without difficulty Goal status:ongoing 10/27/22  3.  Dress without difficulty Goal status:ongoing 10/27/22  4.  Decrease pain with motions by 50% Goal status: INITIAL  5.  Increase AROM of the left shoulder to 130 degrees flexion Goal status: INITIAL  PLAN: PT FREQUENCY: 1-2x/week  PT DURATION: 12 weeks  PLANNED INTERVENTIONS: Therapeutic exercises, Therapeutic activity, Neuromuscular re-education, Balance training, Gait training, Patient/Family education, Self Care, Joint mobilization, Dry Needling, Electrical stimulation, Cryotherapy, Moist heat, Taping, Vasopneumatic device, and Manual therapy  PLAN FOR NEXT SESSION: PROM/AAROM. Look to increase HEP as needed   Jearld Lesch, PT 10/27/2022, 11:45 AM [facility] Out[facility] Rehabilitation at [facility] [address] [facility] [place], [place], [address] Phone: [phone]   Fax:  [phone]

## 2022-10-31 ENCOUNTER — Ambulatory Visit: Payer: Medicare Other | Admitting: Physical Therapy

## 2022-11-02 ENCOUNTER — Ambulatory Visit: Payer: Medicare Other | Attending: Orthopedic Surgery | Admitting: Physical Therapy

## 2022-11-02 DIAGNOSIS — M25612 Stiffness of left shoulder, not elsewhere classified: Secondary | ICD-10-CM | POA: Diagnosis not present

## 2022-11-02 DIAGNOSIS — M25512 Pain in left shoulder: Secondary | ICD-10-CM | POA: Insufficient documentation

## 2022-11-02 DIAGNOSIS — M6281 Muscle weakness (generalized): Secondary | ICD-10-CM

## 2022-11-02 NOTE — Therapy (Signed)
OUTPATIENT PHYSICAL THERAPY UPPER EXTREMITY    Patient Name: Alicia [name] MRN: [id number] DOB:Aug 17, 1955, 67 y.o., female Today's Date: 11/02/2022  END OF SESSION:  PT End of Session - 11/02/22 0925     Visit Number 5    Date for PT Re-Evaluation 01/15/23    Authorization Type [employer]    PT Start Time 0930    PT Stop Time 1020    PT Time Calculation (min) 50 min             Past Medical History:  Diagnosis Date   Anemia    Arthritis    back and knees   ARTHRITIS, KNEE 09/17/2007   Asthma 04/04/2010   pt states she does not have asthma   Cataract 01/07/2019   Closed fracture of distal end of left radius 11/01/2015   Complete deafness    Meningitis at age 43   Deaf    Diabetes mellitus    Diabetic neuropathy (HCC)    Ganglion cyst 06/22/2011   Gastroparesis    GERD (gastroesophageal reflux disease)    H/O: C-section    Hyperlipidemia    Hypertension    Neuromuscular disorder (HCC)    diabetic neuropathy   PSVT (paroxysmal supraventricular tachycardia) 11/09/2017   Event monitor 09/14/2017 - Predominantly sinus rhythm with episodes of narrow-complex tachycardia suggestive of paroxysmal supraventricular tachycardia.   S/P appy    Past Surgical History:  Procedure Laterality Date   APPENDECTOMY     cardiolyte EF 77%, no ischemia in 2006  2006   CARPAL TUNNEL RELEASE Right 04/20/2015   Procedure: RIGHT CARPAL TUNNEL RELEASE;  Surgeon: Cindee Salt, MD;  Location: [facility];  Service: Orthopedics;  Laterality: Right;   CARPAL TUNNEL RELEASE Left 11/04/2015   Procedure: CARPAL TUNNEL RELEASE;  Surgeon: Cindee Salt, MD;  Location: [facility];  Service: Orthopedics;  Laterality: Left;   CESAREAN SECTION     OPEN REDUCTION INTERNAL FIXATION (ORIF) DISTAL RADIAL FRACTURE Left 11/04/2015   Procedure: OPEN REDUCTION INTERNAL FIXATION (ORIF) LEFT DISTAL RADIAL FRACTURE POSSIBLE BONE GRAFT;  Surgeon: Cindee Salt, MD;  Location: [facility];   Service: Orthopedics;  Laterality: Left;   POSTERIOR CERVICAL FUSION/FORAMINOTOMY N/A 12/26/2021   Procedure: Posterior cervical fusion with lateral mass fixation - Cervical Three-Cervical Six, Cervical Laminectomy Cervical Three-Cervica; Five;  Surgeon: Tia Alert, MD;  Location: [facility];  Service: Neurosurgery;  Laterality: N/A;   TRIGGER FINGER RELEASE Right 04/20/2015   Procedure: RELEASE TRIGGER FINGER/A-1 PULLEY RIGHT MIDDLE FINGER,RIGHT RING FINGER;  Surgeon: Cindee Salt, MD;  Location: [facility];  Service: Orthopedics;  Laterality: Right;   TUBAL LIGATION     ULNAR NERVE TRANSPOSITION Right 04/20/2015   Procedure: RIGHT ULNAR NERVE DECOMPRESSION;  Surgeon: Cindee Salt, MD;  Location: [facility];  Service: Orthopedics;  Laterality: Right;   UTERINE FIBROID EMBOLIZATION  2007   WRIST SURGERY     Cyst removed on left   WRIST SURGERY Right 1985   tendon repair R wrist   Patient Active Problem List   Diagnosis Date Noted   S/P appy 03/20/2022   Neuromuscular disorder (HCC) 03/20/2022   H/O: C-section 03/20/2022   Anemia 03/20/2022   Arthritis 03/20/2022   S/P cervical spinal fusion 12/26/2021   Cervical disc disorder with radiculopathy of cervical region 10/28/2021   Sternocleidomastoid muscle tenderness 09/26/2021   Musculoskeletal disorder involving upper trapezius muscle 08/05/2021   Back pain 06/20/2021   Urinary hesitancy 05/13/2021  Hemorrhoids 02/03/2021   Irritable bowel syndrome 11/22/2020   Acute viral sinusitis 11/12/2020   Bilateral sciatica 10/27/2020   Greater trochanteric bursitis of both hips 10/27/2020   Chronic idiopathic constipation 06/25/2020   Elevated blood sugar 06/10/2020   Hernia of abdominal wall 01/28/2020   Spinal stenosis at L4-L5 level 01/12/2020   Transient neurologic deficit 12/22/2019   Frequent loose stools 04/20/2019   Hyperthyroidism 03/07/2019   Hypertension 01/07/2019   Hypercalcemia 10/08/2018    Seasonal allergies 08/27/2018   Osteoarthritis involving multiple joints on both sides of body 05/25/2018   Statin intolerance 12/26/2017   PSVT (paroxysmal supraventricular tachycardia) 11/09/2017   Chest pain 08/30/2017   Hyperlipidemia 08/30/2017   OSA (obstructive sleep apnea) 08/31/2016   Healthcare maintenance 03/07/2016   ARTHRITIS, KNEE 11/01/2015   Diabetes (HCC) 09/14/2015   Muscle pain 09/28/2014   Seizure (HCC) 06/08/2014   Iron deficiency anemia 07/01/2012   Diabetic retinopathy (HCC) 07/21/2011   GERD 12/30/2007   Diabetic polyneuropathy (HCC) 08/30/2006   Type 2 diabetes mellitus with ophthalmic complication (HCC) 08/30/2006   HYPERCHOLESTEROLEMIA 08/30/2006   Hearing loss 08/30/2006    PCP: Barbaraann Faster, MD  REFERRING PROVIDER: Carola Frost, MD  REFERRING DIAG: left humeral fracture  THERAPY DIAG:  Acute pain of left shoulder  Stiffness of left shoulder, not elsewhere classified  Muscle weakness  Rationale for Evaluation and Treatment: Rehabilitation  ONSET DATE: 09/20/22  SUBJECTIVE:                                                                                                                                                                                      SUBJECTIVE STATEMENT:  was suppose to see MD yesterday but son sick so need to reschedule. Doing more so its a bit moe sore   PERTINENT HISTORY: See above  PAIN:  Are you having pain? Yes: NPRS scale: 0-6/10 Pain location: left shoulder to the left elbow Pain description: very sharp electric pain Aggravating factors: any motions will increase pain up to 10/10 Relieving factors: in sling, not moving it pain can be 0/10  PRECAUTIONS: None  WEIGHT BEARING RESTRICTIONS: No  FALLS:  Has patient fallen in last 6 months? Yes. Number of falls 1  LIVING ENVIRONMENT: Lives with: lives with their family Lives in: House/apartment Stairs: Yes: Internal: 26 steps; can reach both Has following equipment at  home: None  OCCUPATION: retired  PLOF: Independent and with all ADL's  PATIENT GOALS: dress independently, less pain, lift a baby in May, drive  NEXT MD VISIT: 1/61/09  OBJECTIVE:   DIAGNOSTIC FINDINGS:  Comminuted humeral fracture  PATIENT SURVEYS :  FOTO 16  COGNITION: Overall cognitive status: Within  functional limits for tasks assessed     SENSATION: WFL's  POSTURE: Fwd head, rounded shoulders  UPPER EXTREMITY ROM: Severe pain in the left shoulder with active motions  Passive ROM Left PROM eval Left AROM eval Left Standing  10/30/22 Left Standing 11/02/22  Shoulder flexion 85 20 70 100  Shoulder extension      Shoulder abduction      Shoulder adduction      Shoulder internal rotation 30 30 63 86  Shoulder external rotation 15 5    Elbow flexion 130 30    Elbow extension 5 514    Wrist flexion      Wrist extension      Wrist ulnar deviation      Wrist radial deviation      Wrist pronation      Wrist supination      (Blank rows = not tested)  UPPER EXTREMITY MMT:  NOT TESTED at EVAL  MMT Right eval Left eval  Shoulder flexion    Shoulder extension    Shoulder abduction    Shoulder adduction    Shoulder internal rotation    Shoulder external rotation    Middle trapezius    Lower trapezius    Elbow flexion    Elbow extension    Wrist flexion    Wrist extension    Wrist ulnar deviation    Wrist radial deviation    Wrist pronation    Wrist supination    Grip strength (lbs)    (Blank rows = not tested)  PALPATION:  Very tight and tender in the left upper trap and the left upper arm   TODAY'S TREATMENT:                                                                                                                                         DATE:   11/02/22 UBE L 1 3 min each way ROM taken and goals assessed ( left shld is subluxed 1.5 finger width) Wall slides flex,abd and circles AA 10 x each Red tband rows and extension 2 sets 10 1# standing  cane ex 10 x each directions Supine PROM and isometrics VASO after for pain and swelling 10/27/22 UBE level 1 37fw/3bkwd 1# Wate bar biceps, extension Wall slides with assist, circles Ball rolls for flexion and circles Red tband rows and extension Thigh slides Shrugs ER/IR on sliding board Supine chest press Supine left shoulder isometrics Vaso medium pressure left shoulder  10/24/22 UBE L 1 2 min fwd, 1 min backward Finger ladder flex and Abd 5 x each Rolling ball on mat 4 ways 10 x each Supine cane chest press, shld flex ,ER and abd 10 x each Supine isometrics PROM Left shld supine VASO Left shld for pain control after session  10/20/22 UBE L 1 2 min fwd, 1 min backward with pain Finger ladder flex and bad 5  x each Rolling ball on mat 4 ways 10 x each Standing 3# cane bicep 10 x Standing cane ex no wt for shld upright row, chest press, ER, IR and shld ext 10 x each Supine cane chest press, shld flex ,ER and abd 10 x each PROM LEft shld supine   Eval  PATIENT EDUCATION: Education details: HEP/POC Person educated: Patient Education method: Programmer, multimedia, Demonstration, Actor cues, Verbal cues, and Handouts Education comprehension: verbalized understanding  HOME EXERCISE PROGRAM: Access Code: [email or web address] URL: [email or web address] Date: 10/16/2022 Prepared by: Stacie Glaze  Exercises - Supine Shoulder Flexion AAROM with Hands Clasped  - 2 x daily - 7 x weekly - 1 sets - 10 reps - 3 hold - Seated Shoulder Flexion Towel Slide at Table Top  - 2 x daily - 7 x weekly - 1 sets - 10 reps - 3 hold - Seated Elbow Extension and Shoulder External Rotation AAROM at Table with Towel  - 2 x daily - 7 x weekly - 1 sets - 10 reps - 3 hold - Circular Shoulder Pendulum with Table Support  - 2 x daily - 7 x weekly - 1 sets - 10 reps - 1 hold - Standing Elbow Flexion Extension AROM  - 2 x daily - 7 x weekly - 1 sets - 10 reps - 3 hold - Seated Shoulder Shrug Circles AROM  Backward  - 2 x daily - 7 x weekly - 1 sets - 10 reps - 3 hold - Seated Scapular Retraction  - 2 x daily - 7 x weekly - 1 sets - 10 reps - 3 hold  ASSESSMENT:  CLINICAL IMPRESSION: ROM taken and improving. Goals assessed. Left shld subluxed 1.5 finger width. Progress ex and ROM with cuing verb and tactile ot increase ROM as she is guarded and pain limited OBJECTIVE IMPAIRMENTS: decreased activity tolerance, decreased coordination, decreased endurance, decreased ROM, decreased strength, increased edema, increased muscle spasms, impaired flexibility, impaired UE functional use, improper body mechanics, postural dysfunction, and pain.   REHAB POTENTIAL: Good  CLINICAL DECISION MAKING: Evolving/moderate complexity  EVALUATION COMPLEXITY: Low  GOALS: Goals reviewed with patient? Yes  SHORT TERM GOALS: Target date: 10/30/22  Independent with initial HEP Goal status: 10/20/22 MET  LONG TERM GOALS: Target date: 01/29/23  Independent with advanced HEP Goal status: INITIAL  2.  Do hair without difficulty Goal status:ongoing 10/27/22 and 11/02/22  3.  Dress without difficulty Goal status:ongoing 10/27/22 progressing 11/02/22  4.  Decrease pain with motions by 50% Goal status: INITIAL  5.  Increase AROM of the left shoulder to 130 degrees flexion Goal status: progressing 11/02/22  PLAN: PT FREQUENCY: 1-2x/week  PT DURATION: 12 weeks  PLANNED INTERVENTIONS: Therapeutic exercises, Therapeutic activity, Neuromuscular re-education, Balance training, Gait training, Patient/Family education, Self Care, Joint mobilization, Dry Needling, Electrical stimulation, Cryotherapy, Moist heat, Taping, Vasopneumatic device, and Manual therapy  PLAN FOR NEXT SESSION: PROM/AAROM. Look to increase HEP as needed   Janney Priego,ANGIE, PTA 11/02/2022, 9:26 AM [facility] [address] [facility]. [place], [place], [address] Phone: [phone]   Fax:  [phone] [address] [place], [place], [address] Phone: [phone]   Fax:  [phone]  Patient Details  Name: Alicia [name] MRN: [id number] Date of Birth: October 07, 1955 Referring Provider:  Bess Kinds, MD

## 2022-11-06 DIAGNOSIS — S42232D 3-part fracture of surgical neck of left humerus, subsequent encounter for fracture with routine healing: Secondary | ICD-10-CM | POA: Diagnosis not present

## 2022-11-07 ENCOUNTER — Ambulatory Visit: Payer: Medicare Other | Admitting: Physical Therapy

## 2022-11-07 DIAGNOSIS — M25512 Pain in left shoulder: Secondary | ICD-10-CM

## 2022-11-07 DIAGNOSIS — M6281 Muscle weakness (generalized): Secondary | ICD-10-CM | POA: Diagnosis not present

## 2022-11-07 DIAGNOSIS — M25612 Stiffness of left shoulder, not elsewhere classified: Secondary | ICD-10-CM | POA: Diagnosis not present

## 2022-11-07 NOTE — Therapy (Signed)
OUTPATIENT PHYSICAL THERAPY UPPER EXTREMITY    Patient Name: Alicia [name] MRN: [id number] DOB:02-Feb-1956, 67 y.o., female Today's Date: 11/07/2022  END OF SESSION:  PT End of Session - 11/07/22 0934     Visit Number 6    Date for PT Re-Evaluation 01/15/23    Authorization Type BCBS    PT Start Time [phone]    PT Stop Time 1015    PT Time Calculation (min) 41 min             Past Medical History:  Diagnosis Date   Anemia    Arthritis    back and knees   ARTHRITIS, KNEE 09/17/2007   Asthma 04/04/2010   pt states she does not have asthma   Cataract 01/07/2019   Closed fracture of distal end of left radius 11/01/2015   Complete deafness    Meningitis at age 29   Deaf    Diabetes mellitus    Diabetic neuropathy (HCC)    Ganglion cyst 06/22/2011   Gastroparesis    GERD (gastroesophageal reflux disease)    H/O: C-section    Hyperlipidemia    Hypertension    Neuromuscular disorder (HCC)    diabetic neuropathy   PSVT (paroxysmal supraventricular tachycardia) 11/09/2017   Event monitor 09/14/2017 - Predominantly sinus rhythm with episodes of narrow-complex tachycardia suggestive of paroxysmal supraventricular tachycardia.   S/P appy    Past Surgical History:  Procedure Laterality Date   APPENDECTOMY     cardiolyte EF 77%, no ischemia in 2006  2006   CARPAL TUNNEL RELEASE Right 04/20/2015   Procedure: RIGHT CARPAL TUNNEL RELEASE;  Surgeon: Cindee Salt, MD;  Location: [facility];  Service: Orthopedics;  Laterality: Right;   CARPAL TUNNEL RELEASE Left 11/04/2015   Procedure: CARPAL TUNNEL RELEASE;  Surgeon: Cindee Salt, MD;  Location: [facility];  Service: Orthopedics;  Laterality: Left;   CESAREAN SECTION     OPEN REDUCTION INTERNAL FIXATION (ORIF) DISTAL RADIAL FRACTURE Left 11/04/2015   Procedure: OPEN REDUCTION INTERNAL FIXATION (ORIF) LEFT DISTAL RADIAL FRACTURE POSSIBLE BONE GRAFT;  Surgeon: Cindee Salt, MD;  Location: [facility];   Service: Orthopedics;  Laterality: Left;   POSTERIOR CERVICAL FUSION/FORAMINOTOMY N/A 12/26/2021   Procedure: Posterior cervical fusion with lateral mass fixation - Cervical Three-Cervical Six, Cervical Laminectomy Cervical Three-Cervica; Five;  Surgeon: Tia Alert, MD;  Location: [facility];  Service: Neurosurgery;  Laterality: N/A;   TRIGGER FINGER RELEASE Right 04/20/2015   Procedure: RELEASE TRIGGER FINGER/A-1 PULLEY RIGHT MIDDLE FINGER,RIGHT RING FINGER;  Surgeon: Cindee Salt, MD;  Location: [facility];  Service: Orthopedics;  Laterality: Right;   TUBAL LIGATION     ULNAR NERVE TRANSPOSITION Right 04/20/2015   Procedure: RIGHT ULNAR NERVE DECOMPRESSION;  Surgeon: Cindee Salt, MD;  Location:  SUR[facility]ERY C[facility];  Service: Orthopedics;  Laterality: Right;   UTERINE FIBROID EMBOLIZATION  2007   WRIST SURGERY     Cyst removed on left   WRIST SURGERY Right 1985   tendon repair R wrist   Patient Active Problem List   Diagnosis Date Noted   S/P appy 03/20/2022   Neuromuscular disorder (HCC) 03/20/2022   H/O: C-section 03/20/2022   Anemia 03/20/2022   Arthritis 03/20/2022   S/P cervical spinal fusion 12/26/2021   Cervical disc disorder with radiculopathy of cervical region 10/28/2021   Sternocleidomastoid muscle tenderness 09/26/2021   Musculoskeletal disorder involving upper trapezius muscle 08/05/2021   Back pain 06/20/2021   Urinary hesitancy 05/13/2021  Hemorrhoids 02/03/2021   Irritable bowel syndrome 11/22/2020   Acute viral sinusitis 11/12/2020   Bilateral sciatica 10/27/2020   Greater trochanteric bursitis of both hips 10/27/2020   Chronic idiopathic constipation 06/25/2020   Elevated blood sugar 06/10/2020   Hernia of abdominal wall 01/28/2020   Spinal stenosis at L4-L5 level 01/12/2020   Transient neurologic deficit 12/22/2019   Frequent loose stools 04/20/2019   Hyperthyroidism 03/07/2019   Hypertension 01/07/2019   Hypercalcemia 10/08/2018    Seasonal allergies 08/27/2018   Osteoarthritis involving multiple joints on both sides of body 05/25/2018   Statin intolerance 12/26/2017   PSVT (paroxysmal supraventricular tachycardia) 11/09/2017   Chest pain 08/30/2017   Hyperlipidemia 08/30/2017   OSA (obstructive sleep apnea) 08/31/2016   Healthcare maintenance 03/07/2016   ARTHRITIS, KNEE 11/01/2015   Diabetes (HCC) 09/14/2015   Muscle pain 09/28/2014   Seizure (HCC) 06/08/2014   Iron deficiency anemia 07/01/2012   Diabetic retinopathy (HCC) 07/21/2011   GERD 12/30/2007   Diabetic polyneuropathy (HCC) 08/30/2006   Type 2 diabetes mellitus with ophthalmic complication (HCC) 08/30/2006   HYPERCHOLESTEROLEMIA 08/30/2006   Hearing loss 08/30/2006    PCP: Barbaraann Faster, MD  REFERRING PROVIDER: Carola Frost, MD  REFERRING DIAG: left humeral fracture  THERAPY DIAG:  Acute pain of left shoulder  Stiffness of left shoulder, not elsewhere classified  Rationale for Evaluation and Treatment: Rehabilitation  ONSET DATE: 09/20/22  SUBJECTIVE:                                                                                                                                                                                      SUBJECTIVE STATEMENT:  saw MD yesterday . Script NO RESISTANCE ,gentle isometric okay and PROM. F/u 2 weeks nad maybe surgery ( script scanned)  PERTINENT HISTORY: See above  PAIN:  Are you having pain? Yes: NPRS scale: 0-6/10 Pain location: left shoulder to the left elbow Pain description: very sharp electric pain Aggravating factors: any motions will increase pain up to 10/10 Relieving factors: in sling, not moving it pain can be 0/10  PRECAUTIONS: None  WEIGHT BEARING RESTRICTIONS: No  FALLS:  Has patient fallen in last 6 months? Yes. Number of falls 1  LIVING ENVIRONMENT: Lives with: lives with their family Lives in: House/apartment Stairs: Yes: Internal: 26 steps; can reach both Has following equipment at  home: None  OCCUPATION: retired  PLOF: Independent and with all ADL's  PATIENT GOALS: dress independently, less pain, lift a baby in May, drive  NEXT MD VISIT: 1/61/09  OBJECTIVE:   DIAGNOSTIC FINDINGS:  Comminuted humeral fracture  PATIENT SURVEYS :  FOTO 16  COGNITION: Overall cognitive status: Within functional limits for tasks  assessed     SENSATION: WFL's  POSTURE: Fwd head, rounded shoulders  UPPER EXTREMITY ROM: Severe pain in the left shoulder with active motions  Passive ROM Left PROM eval Left AROM eval Left Standing  10/30/22 Left Standing 11/02/22 11/07/22  Shoulder flexion 85 20 70 100 110  Shoulder extension       Shoulder abduction       Shoulder adduction       Shoulder internal rotation 30 30 63 86 90  Shoulder external rotation 15 5     Elbow flexion 130 30     Elbow extension 5 514     Wrist flexion       Wrist extension       Wrist ulnar deviation       Wrist radial deviation       Wrist pronation       Wrist supination       (Blank rows = not tested)  UPPER EXTREMITY MMT:  NOT TESTED at EVAL  MMT Right eval Left eval  Shoulder flexion    Shoulder extension    Shoulder abduction    Shoulder adduction    Shoulder internal rotation    Shoulder external rotation    Middle trapezius    Lower trapezius    Elbow flexion    Elbow extension    Wrist flexion    Wrist extension    Wrist ulnar deviation    Wrist radial deviation    Wrist pronation    Wrist supination    Grip strength (lbs)    (Blank rows = not tested)  PALPATION:  Very tight and tender in the left upper trap and the left upper arm   TODAY'S TREATMENT:                                                                                                                                         DATE:   11/07/22 Isometrics -pain with IR and abd PROM LEFt shld to tolerance, gentle Reviewed ALL HEP and adjusted   11/02/22 UBE L 1 3 min each way ROM taken and goals assessed (  left shld is subluxed 1.5 finger width) Wall slides flex,abd and circles AA 10 x each Red tband rows and extension 2 sets 10 1# standing cane ex 10 x each directions Supine PROM and isometrics VASO after for pain and swelling 10/27/22 UBE level 1 35fw/3bkwd 1# Wate bar biceps, extension Wall slides with assist, circles Ball rolls for flexion and circles Red tband rows and extension Thigh slides Shrugs ER/IR on sliding board Supine chest press Supine left shoulder isometrics Vaso medium pressure left shoulder  10/24/22 UBE L 1 2 min fwd, 1 min backward Finger ladder flex and Abd 5 x each Rolling ball on mat 4 ways 10 x each Supine cane chest press, shld flex ,ER and abd 10 x each Supine isometrics  PROM Left shld supine VASO Left shld for pain control after session  10/20/22 UBE L 1 2 min fwd, 1 min backward with pain Finger ladder flex and bad 5 x each Rolling ball on mat 4 ways 10 x each Standing 3# cane bicep 10 x Standing cane ex no wt for shld upright row, chest press, ER, IR and shld ext 10 x each Supine cane chest press, shld flex ,ER and abd 10 x each PROM LEft shld supine   Eval  PATIENT EDUCATION: Education details: HEP/POC Person educated: Patient Education method: Programmer, multimedia, Facilities manager, Actor cues, Verbal cues, and Handouts Education comprehension: verbalized understanding  HOME EXERCISE PROGRAM: Access Code: [email or web address] URL: [email or web address] Date: 10/16/2022 Prepared by: Stacie Glaze  Exercises - Supine Shoulder Flexion AAROM with Hands Clasped  - 2 x daily - 7 x weekly - 1 sets - 10 reps - 3 hold - Seated Shoulder Flexion Towel Slide at Table Top  - 2 x daily - 7 x weekly - 1 sets - 10 reps - 3 hold - Seated Elbow Extension and Shoulder External Rotation AAROM at Table with Towel  - 2 x daily - 7 x weekly - 1 sets - 10 reps - 3 hold - Circular Shoulder Pendulum with Table Support  - 2 x daily - 7 x weekly - 1 sets - 10 reps -  1 hold - Standing Elbow Flexion Extension AROM  - 2 x daily - 7 x weekly - 1 sets - 10 reps - 3 hold - Seated Shoulder Shrug Circles AROM Backward  - 2 x daily - 7 x weekly - 1 sets - 10 reps - 3 hold - Seated Scapular Retraction  - 2 x daily - 7 x weekly - 1 sets - 10 reps - 3 hold  ASSESSMENT:  CLINICAL IMPRESSION: Pt arrived after seeing MD yesterday NO resistance> Pt sees MD in 2 weeks and may need surgery. Pt states PT makes arm worse so we decided to HOLD therapy and have her continue with HEPs that we reviewed OBJECTIVE IMPAIRMENTS: decreased activity tolerance, decreased coordination, decreased endurance, decreased ROM, decreased strength, increased edema, increased muscle spasms, impaired flexibility, impaired UE functional use, improper body mechanics, postural dysfunction, and pain.   REHAB POTENTIAL: Good  CLINICAL DECISION MAKING: Evolving/moderate complexity  EVALUATION COMPLEXITY: Low  GOALS: Goals reviewed with patient? Yes  SHORT TERM GOALS: Target date: 10/30/22  Independent with initial HEP Goal status: 10/20/22 MET  LONG TERM GOALS: Target date: 01/29/23  Independent with advanced HEP Goal status: INITIAL  2.  Do hair without difficulty Goal status:ongoing 10/27/22 and 11/02/22  3.  Dress without difficulty Goal status:ongoing 10/27/22 progressing 11/02/22  4.  Decrease pain with motions by 50% Goal status: INITIAL  5.  Increase AROM of the left shoulder to 130 degrees flexion Goal status: progressing 11/02/22  PLAN: PT FREQUENCY: 1-2x/week  PT DURATION: 12 weeks  PLANNED INTERVENTIONS: Therapeutic exercises, Therapeutic activity, Neuromuscular re-education, Balance training, Gait training, Patient/Family education, Self Care, Joint mobilization, Dry Needling, Electrical stimulation, Cryotherapy, Moist heat, Taping, Vasopneumatic device, and Manual therapy  PLAN FOR NEXT SESSION: HOLD, assess after MD appt   Alicia [name],ANGIE, PTA 11/07/2022, 9:38 AM [facility] at [facility] [address] [facility] [address] [place], [place], [address] Phone: [phone]   Fax:  [phone] at [facility] [address] [place], [place], [address] Phone: [phone]   Fax:  [phone]  Patient Details  Name: Alicia  JENAE [name] MRN: [id number] Date of Birth: May 08, 1956 Referring Provider:  Bess Kinds, [facility] at [facility]. [place], [place], [address] Phone: [phone]   Fax:  [phone]  Patient Details  Name: Alicia [name] MRN: [id number] Date of Birth: [birth year] Referring Provider:  Bess Kinds, MD  Encounter Date: 11/07/2022   Suanne Marker, PTA 11/07/2022, 9:38 AM  [facility] at [facility] [address] [place], [place], [address] Phone: [phone]   Fax:  [phone]

## 2022-11-09 ENCOUNTER — Ambulatory Visit: Payer: Medicare Other | Admitting: Physical Therapy

## 2022-11-13 ENCOUNTER — Ambulatory Visit (INDEPENDENT_AMBULATORY_CARE_PROVIDER_SITE_OTHER): Payer: Medicare Other | Admitting: Student

## 2022-11-13 ENCOUNTER — Encounter: Payer: Self-pay | Admitting: Student

## 2022-11-13 VITALS — BP 136/78 | HR 79 | Wt 148.0 lb

## 2022-11-13 DIAGNOSIS — Z794 Long term (current) use of insulin: Secondary | ICD-10-CM | POA: Diagnosis not present

## 2022-11-13 DIAGNOSIS — E119 Type 2 diabetes mellitus without complications: Secondary | ICD-10-CM | POA: Diagnosis not present

## 2022-11-13 LAB — POCT GLYCOSYLATED HEMOGLOBIN (HGB A1C): HbA1c, POC (controlled diabetic range): 7 % (ref 0.0–7.0)

## 2022-11-13 NOTE — Patient Instructions (Signed)
It was great to see you! Thank you for allowing me to participate in your care!  I recommend that you always bring your medications to each appointment as this makes it easy to ensure we are on the correct medications and helps [name] not miss when refills are needed.  Our plans for today:  - Diabetes Regimen -Decrease dose of Glargine from 12 to 10 units. Take 10 units daily  -Continue Victoza 1 mg weekly  -Continue Novolog 2-4 units w/ meals  -Continue Metformin 1000 mg BID - Take insulin in morning, and remember to eat with it - Make follow up appointment in 3 months - Be sure to eat 3 meals daily, w/ snacks!   Take care and seek immediate care sooner if you develop any concerns.   Dr. Bess Kinds, MD [facility]

## 2022-11-13 NOTE — Progress Notes (Unsigned)
  SUBJECTIVE:   CHIEF COMPLAINT / HPI:   Diabetes Meds: Glargine 12 units daily, Victoza 1 mg q wk, Metformin 1000 mg BID, Novolog 2-4 units w/ meals  Lab Results  Component Value Date   HGBA1C 7.5 (A) 06/30/2022  CBG range: 60-80's in the mornings. Remains below 200 Low CBG's: Has had some episodes of hypoglycemia, broke arm from being hypoglycemic and falling.   PERTINENT  PMH / PSH: DM   Patient Care Team: Bess Kinds, MD as PCP - General (Family Medicine) End, Cristal Deer, MD as PCP - Cardiology (Cardiology) OBJECTIVE:  There were no vitals taken for this visit. Physical Exam Constitutional:      General: She is not in acute distress.    Appearance: Normal appearance. She is not ill-appearing.  Cardiovascular:     Rate and Rhythm: Normal rate and regular rhythm.     Pulses: Normal pulses.     Heart sounds: Normal heart sounds. No murmur heard.    No friction rub. No gallop.  Pulmonary:     Effort: Pulmonary effort is normal. No respiratory distress.     Breath sounds: Normal breath sounds. No stridor. No wheezing, rhonchi or rales.  Abdominal:     General: Abdomen is flat. Bowel sounds are normal. There is no distension.     Palpations: Abdomen is soft.     Tenderness: There is no abdominal tenderness.  Neurological:     Mental Status: She is alert.      ASSESSMENT/PLAN:  There are no diagnoses linked to this encounter. No follow-ups on file. Bess Kinds, MD 11/13/2022, 8:07 AM PGY-2, [facility] {    This will disappear when note is signed, click to select method of visit    :1}

## 2022-11-14 ENCOUNTER — Ambulatory Visit: Payer: Medicare Other | Admitting: Physical Therapy

## 2022-11-14 NOTE — Assessment & Plan Note (Signed)
Patient reports good compliance w/ medication, but continues to have episodes of hypoglycemia. She takes her insulin, but forgets to eat w/ some frequency. Notes she got hypoglycemic and was feeling weak and ended running into something/falling and broke her arm. Her A1c is improved from last time at 7.0 (7.5). Given her risk for hypoglycemia and control DM, will lower long acting insulin dose, and have 3 month f/u. Will also encourage patient to take insulin in the morning, to see if this helps limit risk of hypoglycemia. -Glargine 10 units daily, previously 12 -Novolog 2-4 units w/ meals -Metformin 1000 mg BID -Ozempic 1 mg wkly -f/u 3 months

## 2022-11-14 NOTE — Assessment & Plan Note (Signed)
>>  ASSESSMENT AND PLAN FOR DIABETES (HCC) WRITTEN ON 11/14/2022  8:20 AM BY JENNELLE RIIS, MD  Patient reports good compliance w/ medication, but continues to have episodes of hypoglycemia. She takes her insulin , but forgets to eat w/ some frequency. Notes she got hypoglycemic and was feeling weak and ended running into something/falling and broke her arm. Her A1c is improved from last time at 7.0 (7.5). Given her risk for hypoglycemia and control DM, will lower long acting insulin  dose, and have 3 month f/u. Will also encourage patient to take insulin  in the morning, to see if this helps limit risk of hypoglycemia. -Glargine 10 units daily, previously 12 -Novolog  2-4 units w/ meals -Metformin  1000 mg BID -Ozempic  1 mg wkly -f/u 3 months

## 2022-11-16 ENCOUNTER — Ambulatory Visit: Payer: Medicare Other | Admitting: Physical Therapy

## 2022-11-21 ENCOUNTER — Ambulatory Visit: Payer: Medicare Other | Admitting: Physical Therapy

## 2022-11-22 DIAGNOSIS — S42232D 3-part fracture of surgical neck of left humerus, subsequent encounter for fracture with routine healing: Secondary | ICD-10-CM | POA: Diagnosis not present

## 2022-11-23 ENCOUNTER — Ambulatory Visit: Payer: Medicare Other | Admitting: Physical Therapy

## 2022-11-23 DIAGNOSIS — E113512 Type 2 diabetes mellitus with proliferative diabetic retinopathy with macular edema, left eye: Secondary | ICD-10-CM | POA: Diagnosis not present

## 2022-11-23 DIAGNOSIS — H35373 Puckering of macula, bilateral: Secondary | ICD-10-CM | POA: Diagnosis not present

## 2022-11-23 DIAGNOSIS — H35033 Hypertensive retinopathy, bilateral: Secondary | ICD-10-CM | POA: Diagnosis not present

## 2022-11-23 DIAGNOSIS — H43813 Vitreous degeneration, bilateral: Secondary | ICD-10-CM | POA: Diagnosis not present

## 2022-11-23 DIAGNOSIS — E113591 Type 2 diabetes mellitus with proliferative diabetic retinopathy without macular edema, right eye: Secondary | ICD-10-CM | POA: Diagnosis not present

## 2022-11-28 ENCOUNTER — Ambulatory Visit: Payer: Medicare Other | Admitting: Physical Therapy

## 2022-11-30 ENCOUNTER — Ambulatory Visit: Payer: Medicare Other

## 2022-11-30 DIAGNOSIS — M6281 Muscle weakness (generalized): Secondary | ICD-10-CM | POA: Diagnosis not present

## 2022-11-30 DIAGNOSIS — M25612 Stiffness of left shoulder, not elsewhere classified: Secondary | ICD-10-CM

## 2022-11-30 DIAGNOSIS — M25512 Pain in left shoulder: Secondary | ICD-10-CM

## 2022-11-30 NOTE — Therapy (Signed)
OUTPATIENT PHYSICAL THERAPY UPPER EXTREMITY    Patient Name: Alicia [name] MRN: [id number] DOB:Apr 30, 1956, 67 y.o., female Today's Date: 11/07/2022  END OF SESSION:  PT End of Session - 11/07/22 0934     Visit Number 6    Date for PT Re-Evaluation 01/15/23    Authorization Type [employer]    PT Start Time [phone]    PT Stop Time 1015    PT Time Calculation (min) 41 min             Past Medical History:  Diagnosis Date   Anemia    Arthritis    back and knees   ARTHRITIS, KNEE 09/17/2007   Asthma 04/04/2010   pt states she does not have asthma   Cataract 01/07/2019   Closed fracture of distal end of left radius 11/01/2015   Complete deafness    Meningitis at age 51   Deaf    Diabetes mellitus    Diabetic neuropathy (HCC)    Ganglion cyst 06/22/2011   Gastroparesis    GERD (gastroesophageal reflux disease)    H/O: C-section    Hyperlipidemia    Hypertension    Neuromuscular disorder (HCC)    diabetic neuropathy   PSVT (paroxysmal supraventricular tachycardia) 11/09/2017   Event monitor 09/14/2017 - Predominantly sinus rhythm with episodes of narrow-complex tachycardia suggestive of paroxysmal supraventricular tachycardia.   S/P appy    Past Surgical History:  Procedure Laterality Date   APPENDECTOMY     cardiolyte EF 77%, no ischemia in 2006  2006   CARPAL TUNNEL RELEASE Right 04/20/2015   Procedure: RIGHT CARPAL TUNNEL RELEASE;  Surgeon: Cindee Salt, MD;  Location: [facility];  Service: Orthopedics;  Laterality: Right;   CARPAL TUNNEL RELEASE Left 11/04/2015   Procedure: CARPAL TUNNEL RELEASE;  Surgeon: Cindee Salt, MD;  Location: [facility];  Service: Orthopedics;  Laterality: Left;   CESAREAN SECTION     OPEN REDUCTION INTERNAL FIXATION (ORIF) DISTAL RADIAL FRACTURE Left 11/04/2015   Procedure: OPEN REDUCTION INTERNAL FIXATION (ORIF) LEFT DISTAL RADIAL FRACTURE POSSIBLE BONE GRAFT;  Surgeon: Cindee Salt, MD;  Location: [facility];   Service: Orthopedics;  Laterality: Left;   POSTERIOR CERVICAL FUSION/FORAMINOTOMY N/A 12/26/2021   Procedure: Posterior cervical fusion with lateral mass fixation - Cervical Three-Cervical Six, Cervical Laminectomy Cervical Three-Cervica; Five;  Surgeon: Tia Alert, MD;  Location: [facility] OR;  Service: Neurosurgery;  Laterality: N/A;   TRIGGER FINGER RELEASE Right 04/20/2015   Procedure: RELEASE TRIGGER FINGER/A-1 PULLEY RIGHT MIDDLE FINGER,RIGHT RING FINGER;  Surgeon: Cindee Salt, MD;  Location: [facility];  Service: Orthopedics;  Laterality: Right;   TUBAL LIGATION     ULNAR NERVE TRANSPOSITION Right 04/20/2015   Procedure: RIGHT ULNAR NERVE DECOMPRESSION;  Surgeon: Cindee Salt, MD;  Location: [facility];  Service: Orthopedics;  Laterality: Right;   UTERINE FIBROID EMBOLIZATION  2007   WRIST SURGERY     Cyst removed on left   WRIST SURGERY Right 1985   tendon repair R wrist   Patient Active Problem List   Diagnosis Date Noted   S/P appy 03/20/2022   Neuromuscular disorder (HCC) 03/20/2022   H/O: C-section 03/20/2022   Anemia 03/20/2022   Arthritis 03/20/2022   S/P cervical spinal fusion 12/26/2021   Cervical disc disorder with radiculopathy of cervical region 10/28/2021   Sternocleidomastoid muscle tenderness 09/26/2021   Musculoskeletal disorder involving upper trapezius muscle 08/05/2021   Back pain 06/20/2021   Urinary hesitancy 05/13/2021  Hemorrhoids 02/03/2021   Irritable bowel syndrome 11/22/2020   Acute viral sinusitis 11/12/2020   Bilateral sciatica 10/27/2020   Greater trochanteric bursitis of both hips 10/27/2020   Chronic idiopathic constipation 06/25/2020   Elevated blood sugar 06/10/2020   Hernia of abdominal wall 01/28/2020   Spinal stenosis at L4-L5 level 01/12/2020   Transient neurologic deficit 12/22/2019   Frequent loose stools 04/20/2019   Hyperthyroidism 03/07/2019   Hypertension 01/07/2019   Hypercalcemia 10/08/2018    Seasonal allergies 08/27/2018   Osteoarthritis involving multiple joints on both sides of body 05/25/2018   Statin intolerance 12/26/2017   PSVT (paroxysmal supraventricular tachycardia) 11/09/2017   Chest pain 08/30/2017   Hyperlipidemia 08/30/2017   OSA (obstructive sleep apnea) 08/31/2016   Healthcare maintenance 03/07/2016   ARTHRITIS, KNEE 11/01/2015   Diabetes (HCC) 09/14/2015   Muscle pain 09/28/2014   Seizure (HCC) 06/08/2014   Iron deficiency anemia 07/01/2012   Diabetic retinopathy (HCC) 07/21/2011   GERD 12/30/2007   Diabetic polyneuropathy (HCC) 08/30/2006   Type 2 diabetes mellitus with ophthalmic complication (HCC) 08/30/2006   HYPERCHOLESTEROLEMIA 08/30/2006   Hearing loss 08/30/2006    PCP: Barbaraann Faster, MD  REFERRING PROVIDER: Carola Frost, MD  REFERRING DIAG: left humeral fracture  THERAPY DIAG:  Acute pain of left shoulder  Stiffness of left shoulder, not elsewhere classified  Rationale for Evaluation and Treatment: Rehabilitation  ONSET DATE: 09/20/22  SUBJECTIVE:                                                                                                                                                                                      SUBJECTIVE STATEMENT:  shoulder still hurts, it is between 6-10. Sometimes it hurts really bad  PERTINENT HISTORY: See above  PAIN:  Are you having pain? Yes: NPRS scale: 0-6/10 Pain location: left shoulder to the left elbow Pain description: very sharp electric pain Aggravating factors: any motions will increase pain up to 10/10 Relieving factors: in sling, not moving it pain can be 0/10  PRECAUTIONS: None  WEIGHT BEARING RESTRICTIONS: No  FALLS:  Has patient fallen in last 6 months? Yes. Number of falls 1  LIVING ENVIRONMENT: Lives with: lives with their family Lives in: House/apartment Stairs: Yes: Internal: 26 steps; can reach both Has following equipment at home: None  OCCUPATION: retired  PLOF:  Independent and with all ADL's  PATIENT GOALS: dress independently, less pain, lift a baby in May, drive  NEXT MD VISIT: 9/60/45  OBJECTIVE:   DIAGNOSTIC FINDINGS:  Comminuted humeral fracture  PATIENT SURVEYS :  FOTO 16  COGNITION: Overall cognitive status: Within functional limits for tasks assessed     SENSATION: WFL's  POSTURE:  Fwd head, rounded shoulders  UPPER EXTREMITY ROM: Severe pain in the left shoulder with active motions  Passive ROM Left PROM eval Left AROM eval Left Standing  10/30/22 Left Standing 11/02/22 11/07/22 11/30/22  Shoulder flexion 85 20 70 100 110 115  Shoulder extension        Shoulder abduction      80  Shoulder adduction        Shoulder internal rotation 30 30 63 86 90 90  Shoulder external rotation 15 5      Elbow flexion 130 30      Elbow extension 5 514      Wrist flexion        Wrist extension        Wrist ulnar deviation        Wrist radial deviation        Wrist pronation        Wrist supination        (Blank rows = not tested)  UPPER EXTREMITY MMT:  NOT TESTED at EVAL  MMT Right eval Left eval  Shoulder flexion    Shoulder extension    Shoulder abduction    Shoulder adduction    Shoulder internal rotation    Shoulder external rotation    Middle trapezius    Lower trapezius    Elbow flexion    Elbow extension    Wrist flexion    Wrist extension    Wrist ulnar deviation    Wrist radial deviation    Wrist pronation    Wrist supination    Grip strength (lbs)    (Blank rows = not tested)  PALPATION:  Very tight and tender in the left upper trap and the left upper arm   TODAY'S TREATMENT:                                                                                                                                         DATE:  11/30/22 Review goals UBE L1 x55mins  Finger ladder x5 flexion and abd  Wall slides flex x10  Red tband rows 2x10 1# standing cane flexion, ext x10  seated PROM in all  directions  VASO  after for pain and swelling 10 mins    11/07/22 Isometrics -pain with IR and abd PROM LEFt shld to tolerance, gentle Reviewed ALL HEP and adjusted   11/02/22 UBE L 1 3 min each way ROM taken and goals assessed ( left shld is subluxed 1.5 finger width) Wall slides flex,abd and circles AA 10 x each Red tband rows and extension 2 sets 10 1# standing cane ex 10 x each directions Supine PROM and isometrics VASO after for pain and swelling   10/27/22 UBE level 1 92fw/3bkwd 1# Wate bar biceps, extension Wall slides with assist, circles Ball rolls for flexion and circles Red tband rows and extension Thigh slides Shrugs ER/IR on sliding  board Supine chest press Supine left shoulder isometrics Vaso medium pressure left shoulder  10/24/22 UBE L 1 2 min fwd, 1 min backward Finger ladder flex and Abd 5 x each Rolling ball on mat 4 ways 10 x each Supine cane chest press, shld flex ,ER and abd 10 x each Supine isometrics PROM Left shld supine VASO Left shld for pain control after session  10/20/22 UBE L 1 2 min fwd, 1 min backward with pain Finger ladder flex and bad 5 x each Rolling ball on mat 4 ways 10 x each Standing 3# cane bicep 10 x Standing cane ex no wt for shld upright row, chest press, ER, IR and shld ext 10 x each Supine cane chest press, shld flex ,ER and abd 10 x each PROM LEft shld supine   Eval  PATIENT EDUCATION: Education details: HEP/POC Person educated: Patient Education method: Programmer, multimedia, Facilities manager, Actor cues, Verbal cues, and Handouts Education comprehension: verbalized understanding  HOME EXERCISE PROGRAM: Access Code: [email or web address] URL: [email or web address] Date: 10/16/2022 Prepared by: Stacie Glaze  Exercises - Supine Shoulder Flexion AAROM with Hands Clasped  - 2 x daily - 7 x weekly - 1 sets - 10 reps - 3 hold - Seated Shoulder Flexion Towel Slide at Table Top  - 2 x daily - 7 x weekly - 1 sets - 10 reps - 3 hold - Seated  Elbow Extension and Shoulder External Rotation AAROM at Table with Towel  - 2 x daily - 7 x weekly - 1 sets - 10 reps - 3 hold - Circular Shoulder Pendulum with Table Support  - 2 x daily - 7 x weekly - 1 sets - 10 reps - 1 hold - Standing Elbow Flexion Extension AROM  - 2 x daily - 7 x weekly - 1 sets - 10 reps - 3 hold - Seated Shoulder Shrug Circles AROM Backward  - 2 x daily - 7 x weekly - 1 sets - 10 reps - 3 hold - Seated Scapular Retraction  - 2 x daily - 7 x weekly - 1 sets - 10 reps - 3 hold  ASSESSMENT:  CLINICAL IMPRESSION: Patient comes back to PT after over 20 days. Reviewed her goals and she still is working  towards meeting them with little progress so far. Patient returns with updated referral that states, increase resistance and ROM exercises. Also says no restrictions and would like to continue PT for 2x a week. She continues to have high pain levels and limited motions mostly into flexion and abduction. We worked on some AAROM and gentle strengthening. Very guarded with PROM.    OBJECTIVE IMPAIRMENTS: decreased activity tolerance, decreased coordination, decreased endurance, decreased ROM, decreased strength, increased edema, increased muscle spasms, impaired flexibility, impaired UE functional use, improper body mechanics, postural dysfunction, and pain.   REHAB POTENTIAL: Good  CLINICAL DECISION MAKING: Evolving/moderate complexity  EVALUATION COMPLEXITY: Low  GOALS: Goals reviewed with patient? Yes  SHORT TERM GOALS: Target date: 10/30/22  Independent with initial HEP Goal status: 10/20/22 MET  LONG TERM GOALS: Target date: 01/29/23  Independent with advanced HEP Goal status: INITIAL  2.  Do hair without difficulty Goal status:ongoing 10/27/22 and 11/02/22, progressing 11/30/22  3.  Dress without difficulty Goal status:ongoing 10/27/22 progressing 11/02/22, progressing 11/30/22  4.  Decrease pain with motions by 50% Goal status: IN PROGRESS  5.  Increase AROM of  the left shoulder to 130 degrees flexion Goal status: progressing 11/02/22  PLAN: PT FREQUENCY: 1-2x/week  PT DURATION: 12  weeks  PLANNED INTERVENTIONS: Therapeutic exercises, Therapeutic activity, Neuromuscular re-education, Balance training, Gait training, Patient/Family education, Self Care, Joint mobilization, Dry Needling, Electrical stimulation, Cryotherapy, Moist heat, Taping, Vasopneumatic device, and Manual therapy  PLAN FOR NEXT SESSION: HOLD, assess after MD appt   PAYSEUR,ANGIE, PTA 11/07/2022, 9:38 AM [facility] at [facility] [address] [facility]. [place], [place], [address] Phone: [phone]   Fax:  [phone] at [facility] [address]. [place], [place], [address] Phone: [phone]   Fax:  [phone]  Patient Details  Name: Alicia [name] MRN: [id number] Date of Birth: 1956/02/09 Referring Provider:  Bess Kinds, [facility] at [facility]. [place], [place], [address] Phone: [phone]   Fax:  [phone]  Patient Details  Name: Alicia [name] MRN: [id number] Date of Birth: 1956-01-24 Referring Provider:  Bess Kinds, MD  Encounter Date: 11/07/2022   Suanne Marker, PTA 11/07/2022, 9:38 AM  [facility] at [facility] [address] [place], [place], [address] Phone: [phone]   Fax:  [phone]

## 2022-12-04 ENCOUNTER — Ambulatory Visit: Payer: Medicare Other | Attending: Orthopedic Surgery

## 2022-12-04 DIAGNOSIS — M25512 Pain in left shoulder: Secondary | ICD-10-CM | POA: Diagnosis not present

## 2022-12-04 DIAGNOSIS — M6281 Muscle weakness (generalized): Secondary | ICD-10-CM | POA: Diagnosis not present

## 2022-12-04 DIAGNOSIS — M25612 Stiffness of left shoulder, not elsewhere classified: Secondary | ICD-10-CM | POA: Diagnosis not present

## 2022-12-04 NOTE — Therapy (Signed)
OUTPATIENT PHYSICAL THERAPY UPPER EXTREMITY    Patient Name: Alicia [name] MRN: [id number] DOB:Nov 07, 1955, 67 y.o., female Today's Date: 12/04/2022  END OF SESSION:  PT End of Session - 12/04/22 0853     Visit Number 8    Date for PT Re-Evaluation 01/15/23    Authorization Type [employer]    PT Start Time [phone]    PT Stop Time 0930    PT Time Calculation (min) 37 min             Past Medical History:  Diagnosis Date   Anemia    Arthritis    back and knees   ARTHRITIS, KNEE 09/17/2007   Asthma 04/04/2010   pt states she does not have asthma   Cataract 01/07/2019   Closed fracture of distal end of left radius 11/01/2015   Complete deafness    Meningitis at age 51   Deaf    Diabetes mellitus    Diabetic neuropathy (HCC)    Ganglion cyst 06/22/2011   Gastroparesis    GERD (gastroesophageal reflux disease)    H/O: C-section    Hyperlipidemia    Hypertension    Neuromuscular disorder (HCC)    diabetic neuropathy   PSVT (paroxysmal supraventricular tachycardia) 11/09/2017   Event monitor 09/14/2017 - Predominantly sinus rhythm with episodes of narrow-complex tachycardia suggestive of paroxysmal supraventricular tachycardia.   S/P appy    Past Surgical History:  Procedure Laterality Date   APPENDECTOMY     cardiolyte EF 77%, no ischemia in 2006  2006   CARPAL TUNNEL RELEASE Right 04/20/2015   Procedure: RIGHT CARPAL TUNNEL RELEASE;  Surgeon: Cindee Salt, MD;  Location: [facility];  Service: Orthopedics;  Laterality: Right;   CARPAL TUNNEL RELEASE Left 11/04/2015   Procedure: CARPAL TUNNEL RELEASE;  Surgeon: Cindee Salt, MD;  Location: [facility];  Service: Orthopedics;  Laterality: Left;   CESAREAN SECTION     OPEN REDUCTION INTERNAL FIXATION (ORIF) DISTAL RADIAL FRACTURE Left 11/04/2015   Procedure: OPEN REDUCTION INTERNAL FIXATION (ORIF) LEFT DISTAL RADIAL FRACTURE POSSIBLE BONE GRAFT;  Surgeon: Cindee Salt, MD;  Location: [facility];   Service: Orthopedics;  Laterality: Left;   POSTERIOR CERVICAL FUSION/FORAMINOTOMY N/A 12/26/2021   Procedure: Posterior cervical fusion with lateral mass fixation - Cervical Three-Cervical Six, Cervical Laminectomy Cervical Three-Cervica; Five;  Surgeon: Tia Alert, MD;  Location: [facility] OR;  Service: Neurosurgery;  Laterality: N/A;   TRIGGER FINGER RELEASE Right 04/20/2015   Procedure: RELEASE TRIGGER FINGER/A-1 PULLEY RIGHT MIDDLE FINGER,RIGHT RING FINGER;  Surgeon: Cindee Salt, MD;  Location: [facility];  Service: Orthopedics;  Laterality: Right;   TUBAL LIGATION     ULNAR NERVE TRANSPOSITION Right 04/20/2015   Procedure: RIGHT ULNAR NERVE DECOMPRESSION;  Surgeon: Cindee Salt, MD;  Location: [facility];  Service: Orthopedics;  Laterality: Right;   UTERINE FIBROID EMBOLIZATION  2007   WRIST SURGERY     Cyst removed on left   WRIST SURGERY Right 1985   tendon repair R wrist   Patient Active Problem List   Diagnosis Date Noted   S/P appy 03/20/2022   Neuromuscular disorder (HCC) 03/20/2022   H/O: C-section 03/20/2022   Anemia 03/20/2022   Arthritis 03/20/2022   S/P cervical spinal fusion 12/26/2021   Cervical disc disorder with radiculopathy of cervical region 10/28/2021   Sternocleidomastoid muscle tenderness 09/26/2021   Musculoskeletal disorder involving upper trapezius muscle 08/05/2021   Back pain 06/20/2021   Urinary hesitancy 05/13/2021  Hemorrhoids 02/03/2021   Irritable bowel syndrome 11/22/2020   Acute viral sinusitis 11/12/2020   Bilateral sciatica 10/27/2020   Greater trochanteric bursitis of both hips 10/27/2020   Chronic idiopathic constipation 06/25/2020   Elevated blood sugar 06/10/2020   Hernia of abdominal wall 01/28/2020   Spinal stenosis at L4-L5 level 01/12/2020   Transient neurologic deficit 12/22/2019   Frequent loose stools 04/20/2019   Hyperthyroidism 03/07/2019   Hypertension 01/07/2019   Hypercalcemia 10/08/2018    Seasonal allergies 08/27/2018   Osteoarthritis involving multiple joints on both sides of body 05/25/2018   Statin intolerance 12/26/2017   PSVT (paroxysmal supraventricular tachycardia) 11/09/2017   Chest pain 08/30/2017   Hyperlipidemia 08/30/2017   OSA (obstructive sleep apnea) 08/31/2016   Healthcare maintenance 03/07/2016   ARTHRITIS, KNEE 11/01/2015   Diabetes (HCC) 09/14/2015   Muscle pain 09/28/2014   Seizure (HCC) 06/08/2014   Iron deficiency anemia 07/01/2012   Diabetic retinopathy (HCC) 07/21/2011   GERD 12/30/2007   Diabetic polyneuropathy (HCC) 08/30/2006   Type 2 diabetes mellitus with ophthalmic complication (HCC) 08/30/2006   HYPERCHOLESTEROLEMIA 08/30/2006   Hearing loss 08/30/2006    PCP: Barbaraann Faster, MD  REFERRING PROVIDER: Carola Frost, MD  REFERRING DIAG: left humeral fracture  THERAPY DIAG:  Acute pain of left shoulder  Stiffness of left shoulder, not elsewhere classified  Muscle weakness  Rationale for Evaluation and Treatment: Rehabilitation  ONSET DATE: 09/20/22  SUBJECTIVE:                                                                                                                                                                                      SUBJECTIVE STATEMENT:  still feel the same, it is sore and still hurts esp when I try to lift something or sleep on it.   PERTINENT HISTORY: See above  PAIN:  Are you having pain? Yes: NPRS scale: 7-8/10 Pain location: left shoulder to the left elbow Pain description: very sharp electric pain Aggravating factors: any motions will increase pain up to 10/10 Relieving factors: in sling, not moving it pain can be 0/10  PRECAUTIONS: None  WEIGHT BEARING RESTRICTIONS: No  FALLS:  Has patient fallen in last 6 months? Yes. Number of falls 1  LIVING ENVIRONMENT: Lives with: lives with their family Lives in: House/apartment Stairs: Yes: Internal: 26 steps; can reach both Has following equipment at home:  None  OCCUPATION: retired  PLOF: Independent and with all ADL's  PATIENT GOALS: dress independently, less pain, lift a baby in May, drive  NEXT MD VISIT: 1/61/09  OBJECTIVE:   DIAGNOSTIC FINDINGS:  Comminuted humeral fracture  PATIENT SURVEYS :  FOTO 16  COGNITION: Overall cognitive status: Within  functional limits for tasks assessed     SENSATION: WFL's  POSTURE: Fwd head, rounded shoulders  UPPER EXTREMITY ROM: Severe pain in the left shoulder with active motions  Passive ROM Left PROM eval Left AROM eval Left Standing  10/30/22 Left Standing 11/02/22 11/07/22 11/30/22  Shoulder flexion 85 20 70 100 110 115  Shoulder extension        Shoulder abduction      80  Shoulder adduction        Shoulder internal rotation 30 30 63 86 90 90  Shoulder external rotation 15 5      Elbow flexion 130 30      Elbow extension 5 514      Wrist flexion        Wrist extension        Wrist ulnar deviation        Wrist radial deviation        Wrist pronation        Wrist supination        (Blank rows = not tested)  UPPER EXTREMITY MMT:  NOT TESTED at EVAL  MMT Right eval Left eval  Shoulder flexion    Shoulder extension    Shoulder abduction    Shoulder adduction    Shoulder internal rotation    Shoulder external rotation    Middle trapezius    Lower trapezius    Elbow flexion    Elbow extension    Wrist flexion    Wrist extension    Wrist ulnar deviation    Wrist radial deviation    Wrist pronation    Wrist supination    Grip strength (lbs)    (Blank rows = not tested)  PALPATION:  Very tight and tender in the left upper trap and the left upper arm   TODAY'S TREATMENT:                                                                                                                                         DATE:  12/04/22 UBE L1 x5mins  Supine PROM in all directions  Supine AAROM flexion x10 Supine chest press x10 Supine 1# ER/IR  Standing abd with dowel x10   Bicep curls 2# 2x10 Vaso to L shoulder 10 mins   11/30/22 Review goals UBE L1 x76mins  Finger ladder x5 flexion and abd  Wall slides flex x10  Red tband rows 2x10 1# standing cane flexion, ext x10  seated PROM in all directions  VASO after for pain and swelling 10 mins    11/07/22 Isometrics -pain with IR and abd PROM LEFt shld to tolerance, gentle Reviewed ALL HEP and adjusted   11/02/22 UBE L 1 3 min each way ROM taken and goals assessed ( left shld is subluxed 1.5 finger width) Wall slides flex,abd and circles AA 10 x each Red tband rows and extension 2 sets  10 1# standing cane ex 10 x each directions Supine PROM and isometrics VASO after for pain and swelling   10/27/22 UBE level 1 36fw/3bkwd 1# Wate bar biceps, extension Wall slides with assist, circles Ball rolls for flexion and circles Red tband rows and extension Thigh slides Shrugs ER/IR on sliding board Supine chest press Supine left shoulder isometrics Vaso medium pressure left shoulder  10/24/22 UBE L 1 2 min fwd, 1 min backward Finger ladder flex and Abd 5 x each Rolling ball on mat 4 ways 10 x each Supine cane chest press, shld flex ,ER and abd 10 x each Supine isometrics PROM Left shld supine VASO Left shld for pain control after session  10/20/22 UBE L 1 2 min fwd, 1 min backward with pain Finger ladder flex and bad 5 x each Rolling ball on mat 4 ways 10 x each Standing 3# cane bicep 10 x Standing cane ex no wt for shld upright row, chest press, ER, IR and shld ext 10 x each Supine cane chest press, shld flex ,ER and abd 10 x each PROM LEft shld supine   Eval  PATIENT EDUCATION: Education details: HEP/POC Person educated: Patient Education method: Programmer, multimedia, Facilities manager, Actor cues, Verbal cues, and Handouts Education comprehension: verbalized understanding  HOME EXERCISE PROGRAM: Access Code: [email or web address] URL: [email or web address] Date: 10/16/2022 Prepared by:  Stacie Glaze  Exercises - Supine Shoulder Flexion AAROM with Hands Clasped  - 2 x daily - 7 x weekly - 1 sets - 10 reps - 3 hold - Seated Shoulder Flexion Towel Slide at Table Top  - 2 x daily - 7 x weekly - 1 sets - 10 reps - 3 hold - Seated Elbow Extension and Shoulder External Rotation AAROM at Table with Towel  - 2 x daily - 7 x weekly - 1 sets - 10 reps - 3 hold - Circular Shoulder Pendulum with Table Support  - 2 x daily - 7 x weekly - 1 sets - 10 reps - 1 hold - Standing Elbow Flexion Extension AROM  - 2 x daily - 7 x weekly - 1 sets - 10 reps - 3 hold - Seated Shoulder Shrug Circles AROM Backward  - 2 x daily - 7 x weekly - 1 sets - 10 reps - 3 hold - Seated Scapular Retraction  - 2 x daily - 7 x weekly - 1 sets - 10 reps - 3 hold  ASSESSMENT:  CLINICAL IMPRESSION: Patient returns with continued high pain levels in L shoulder. We worked on some AAROM and gentle strengthening. Very guarded with PROM especially into flexion and abduction. Cues needed to avoid pain compensations with most interventions. Patient request ice at end of visit.   OBJECTIVE IMPAIRMENTS: decreased activity tolerance, decreased coordination, decreased endurance, decreased ROM, decreased strength, increased edema, increased muscle spasms, impaired flexibility, impaired UE functional use, improper body mechanics, postural dysfunction, and pain.   REHAB POTENTIAL: Good  CLINICAL DECISION MAKING: Evolving/moderate complexity  EVALUATION COMPLEXITY: Low  GOALS: Goals reviewed with patient? Yes  SHORT TERM GOALS: Target date: 10/30/22  Independent with initial HEP Goal status: 10/20/22 MET  LONG TERM GOALS: Target date: 01/29/23  Independent with advanced HEP Goal status: INITIAL  2.  Do hair without difficulty Goal status:ongoing 10/27/22 and 11/02/22, progressing 11/30/22  3.  Dress without difficulty Goal status:ongoing 10/27/22 progressing 11/02/22, progressing 11/30/22  4.  Decrease pain with motions  by 50% Goal status: IN PROGRESS  5.  Increase AROM of the left shoulder to  130 degrees flexion Goal status: progressing 11/02/22  PLAN: PT FREQUENCY: 1-2x/week  PT DURATION: 12 weeks  PLANNED INTERVENTIONS: Therapeutic exercises, Therapeutic activity, Neuromuscular re-education, Balance training, Gait training, Patient/Family education, Self Care, Joint mobilization, Dry Needling, Electrical stimulation, Cryotherapy, Moist heat, Taping, Vasopneumatic device, and Manual therapy  PLAN FOR NEXT SESSION: HOLD, assess after MD appt   Cassie Freer, PT 12/04/2022, 9:24 AM [facility]. [place], [place], [address] Phone: [phone]   Fax:  [phone] [address] [place], [place], [address] Phone: [phone]   Fax:  [phone]  Patient Details  Name: Alicia [name] MRN: [id number] Date of Birth: Oct 13, 1955 Referring Provider:  Bess Kinds, [facility]. [place], [place], [address] Phone: [phone]   Fax:  [phone]  Patient Details  Name: Alicia [name] MRN: [id number] Date of Birth: 23-May-1956 Referring Provider:  Bess Kinds, MD  Encounter Date: 12/04/2022   Cassie Freer, PT 12/04/2022, 9:24 AM  [facility] [address] [facility] [address] [place], [place], [address] Phone: [phone]   Fax:  [phone]

## 2022-12-05 NOTE — Therapy (Signed)
OUTPATIENT PHYSICAL THERAPY UPPER EXTREMITY    Patient Name: Alicia [name] MRN: [id number] DOB:[birth year], 67 y.o., female Today's Date: 12/06/2022  END OF SESSION:  PT End of Session - 12/06/22 1450     Visit Number 9    Date for PT Re-Evaluation 01/15/23    Authorization Type [employer]    PT Start Time 1450    PT Stop Time 1530    PT Time Calculation (min) 40 min    Activity Tolerance Patient limited by pain             Past Medical History:  Diagnosis Date   Anemia    Arthritis    back and knees   ARTHRITIS, KNEE 09/17/2007   Asthma 04/04/2010   pt states she does not have asthma   Cataract 01/07/2019   Closed fracture of distal end of left radius 11/01/2015   Complete deafness    Meningitis at age 37   Deaf    Diabetes mellitus    Diabetic neuropathy (HCC)    Ganglion cyst 06/22/2011   Gastroparesis    GERD (gastroesophageal reflux disease)    H/O: C-section    Hyperlipidemia    Hypertension    Neuromuscular disorder (HCC)    diabetic neuropathy   PSVT (paroxysmal supraventricular tachycardia) 11/09/2017   Event monitor 09/14/2017 - Predominantly sinus rhythm with episodes of narrow-complex tachycardia suggestive of paroxysmal supraventricular tachycardia.   S/P appy    Past Surgical History:  Procedure Laterality Date   APPENDECTOMY     cardiolyte EF 77%, no ischemia in 2006  2006   CARPAL TUNNEL RELEASE Right 04/20/2015   Procedure: RIGHT CARPAL TUNNEL RELEASE;  Surgeon: Cindee Salt, MD;  Location: [facility];  Service: Orthopedics;  Laterality: Right;   CARPAL TUNNEL RELEASE Left 11/04/2015   Procedure: CARPAL TUNNEL RELEASE;  Surgeon: Cindee Salt, MD;  Location: [facility];  Service: Orthopedics;  Laterality: Left;   CESAREAN SECTION     OPEN REDUCTION INTERNAL FIXATION (ORIF) DISTAL RADIAL FRACTURE Left 11/04/2015   Procedure: OPEN REDUCTION INTERNAL FIXATION (ORIF) LEFT DISTAL RADIAL FRACTURE POSSIBLE BONE GRAFT;  Surgeon: Cindee Salt, MD;  Location: [facility];  Service: Orthopedics;  Laterality: Left;   POSTERIOR CERVICAL FUSION/FORAMINOTOMY N/A 12/26/2021   Procedure: Posterior cervical fusion with lateral mass fixation - Cervical Three-Cervical Six, Cervical Laminectomy Cervical Three-Cervica; Five;  Surgeon: Tia Alert, MD;  Location: [facility];  Service: Neurosurgery;  Laterality: N/A;   TRIGGER FINGER RELEASE Right 04/20/2015   Procedure: RELEASE TRIGGER FINGER/A-1 PULLEY RIGHT MIDDLE FINGER,RIGHT RING FINGER;  Surgeon: Cindee Salt, MD;  Location: [facility];  Service: Orthopedics;  Laterality: Right;   TUBAL LIGATION     ULNAR NERVE TRANSPOSITION Right 04/20/2015   Procedure: RIGHT ULNAR NERVE DECOMPRESSION;  Surgeon: Cindee Salt, MD;  Location: [facility];  Service: Orthopedics;  Laterality: Right;   UTERINE FIBROID EMBOLIZATION  2007   WRIST SURGERY     Cyst removed on left   WRIST SURGERY Right 1985   tendon repair R wrist   Patient Active Problem List   Diagnosis Date Noted   S/P appy 03/20/2022   Neuromuscular disorder (HCC) 03/20/2022   H/O: C-section 03/20/2022   Anemia 03/20/2022   Arthritis 03/20/2022   S/P cervical spinal fusion 12/26/2021   Cervical disc disorder with radiculopathy of cervical region 10/28/2021   Sternocleidomastoid muscle tenderness 09/26/2021   Musculoskeletal disorder involving upper trapezius muscle 08/05/2021  Back pain 06/20/2021   Urinary hesitancy 05/13/2021   Hemorrhoids 02/03/2021   Irritable bowel syndrome 11/22/2020   Acute viral sinusitis 11/12/2020   Bilateral sciatica 10/27/2020   Greater trochanteric bursitis of both hips 10/27/2020   Chronic idiopathic constipation 06/25/2020   Elevated blood sugar 06/10/2020   Hernia of abdominal wall 01/28/2020   Spinal stenosis at L4-L5 level 01/12/2020   Transient neurologic deficit 12/22/2019   Frequent loose stools 04/20/2019   Hyperthyroidism 03/07/2019   Hypertension  01/07/2019   Hypercalcemia 10/08/2018   Seasonal allergies 08/27/2018   Osteoarthritis involving multiple joints on both sides of body 05/25/2018   Statin intolerance 12/26/2017   PSVT (paroxysmal supraventricular tachycardia) 11/09/2017   Chest pain 08/30/2017   Hyperlipidemia 08/30/2017   OSA (obstructive sleep apnea) 08/31/2016   Healthcare maintenance 03/07/2016   ARTHRITIS, KNEE 11/01/2015   Diabetes (HCC) 09/14/2015   Muscle pain 09/28/2014   Seizure (HCC) 06/08/2014   Iron deficiency anemia 07/01/2012   Diabetic retinopathy (HCC) 07/21/2011   GERD 12/30/2007   Diabetic polyneuropathy (HCC) 08/30/2006   Type 2 diabetes mellitus with ophthalmic complication (HCC) 08/30/2006   HYPERCHOLESTEROLEMIA 08/30/2006   Hearing loss 08/30/2006    PCP: Barbaraann Faster, MD  REFERRING PROVIDER: Carola Frost, MD  REFERRING DIAG: left humeral fracture  THERAPY DIAG:  Acute pain of left shoulder  Stiffness of left shoulder, not elsewhere classified  Muscle weakness  Rationale for Evaluation and Treatment: Rehabilitation  ONSET DATE: 09/20/22  SUBJECTIVE:                                                                                                                                                                                      SUBJECTIVE STATEMENT:  so-so. Pain is about a 4 right now. Was sore after PT, I tried to drive after and it was not a good ides.   PERTINENT HISTORY: See above  PAIN:  Are you having pain? Yes: NPRS scale: 4/10 Pain location: left shoulder to the left elbow Pain description: very sharp electric pain Aggravating factors: any motions will increase pain up to 10/10 Relieving factors: in sling, not moving it pain can be 0/10  PRECAUTIONS: None  WEIGHT BEARING RESTRICTIONS: No  FALLS:  Has patient fallen in last 6 months? Yes. Number of falls 1  LIVING ENVIRONMENT: Lives with: lives with their family Lives in: House/apartment Stairs: Yes: Internal: 26 steps;  can reach both Has following equipment at home: None  OCCUPATION: retired  PLOF: Independent and with all ADL's  PATIENT GOALS: dress independently, less pain, lift a baby in May, drive  NEXT MD VISIT: 1/61/09  OBJECTIVE:   DIAGNOSTIC FINDINGS:  Comminuted humeral fracture  PATIENT SURVEYS :  FOTO 16  COGNITION: Overall cognitive status: Within functional limits for tasks assessed     SENSATION: WFL's  POSTURE: Fwd head, rounded shoulders  UPPER EXTREMITY ROM: Severe pain in the left shoulder with active motions  Passive ROM Left PROM eval Left AROM eval Left Standing  10/30/22 Left Standing 11/02/22 11/07/22 11/30/22  Shoulder flexion 85 20 70 100 110 115  Shoulder extension        Shoulder abduction      80  Shoulder adduction        Shoulder internal rotation 30 30 63 86 90 90  Shoulder external rotation 15 5      Elbow flexion 130 30      Elbow extension 5 514      Wrist flexion        Wrist extension        Wrist ulnar deviation        Wrist radial deviation        Wrist pronation        Wrist supination        (Blank rows = not tested)  UPPER EXTREMITY MMT:  NOT TESTED at EVAL  MMT Right eval Left eval  Shoulder flexion    Shoulder extension    Shoulder abduction    Shoulder adduction    Shoulder internal rotation    Shoulder external rotation    Middle trapezius    Lower trapezius    Elbow flexion    Elbow extension    Wrist flexion    Wrist extension    Wrist ulnar deviation    Wrist radial deviation    Wrist pronation    Wrist supination    Grip strength (lbs)    (Blank rows = not tested)  PALPATION:  Very tight and tender in the left upper trap and the left upper arm   TODAY'S TREATMENT:                                                                                                                                         DATE:  12/06/22 UBE L1 x3mins each way  Wall slides flexion x10 Rows and ext red 2x10 ER /IR yellow band  2x10 Pulleys flexion and abd 2x10    12/04/22 UBE L1 x16mins  Supine PROM in all directions  Supine AAROM flexion x10 Supine chest press x10 Supine 1# ER/IR  Standing abd with dowel x10  Bicep curls 2# 2x10 Vaso to L shoulder 10 mins   11/30/22 Review goals UBE L1 x59mins  Finger ladder x5 flexion and abd  Wall slides flex x10  Red tband rows 2x10 1# standing cane flexion, ext x10  seated PROM in all directions  VASO after for pain and swelling 10 mins    11/07/22 Isometrics -pain with IR and abd PROM LEFt shld to tolerance, gentle Reviewed ALL HEP  and adjusted   11/02/22 UBE L 1 3 min each way ROM taken and goals assessed ( left shld is subluxed 1.5 finger width) Wall slides flex,abd and circles AA 10 x each Red tband rows and extension 2 sets 10 1# standing cane ex 10 x each directions Supine PROM and isometrics VASO after for pain and swelling   10/27/22 UBE level 1 25fw/3bkwd 1# Wate bar biceps, extension Wall slides with assist, circles Ball rolls for flexion and circles Red tband rows and extension Thigh slides Shrugs ER/IR on sliding board Supine chest press Supine left shoulder isometrics Vaso medium pressure left shoulder  10/24/22 UBE L 1 2 min fwd, 1 min backward Finger ladder flex and Abd 5 x each Rolling ball on mat 4 ways 10 x each Supine cane chest press, shld flex ,ER and abd 10 x each Supine isometrics PROM Left shld supine VASO Left shld for pain control after session  10/20/22 UBE L 1 2 min fwd, 1 min backward with pain Finger ladder flex and bad 5 x each Rolling ball on mat 4 ways 10 x each Standing 3# cane bicep 10 x Standing cane ex no wt for shld upright row, chest press, ER, IR and shld ext 10 x each Supine cane chest press, shld flex ,ER and abd 10 x each PROM LEft shld supine   Eval  PATIENT EDUCATION: Education details: HEP/POC Person educated: Patient Education method: Programmer, multimedia, Facilities manager, Actor cues, Verbal cues,  and Handouts Education comprehension: verbalized understanding  HOME EXERCISE PROGRAM: Access Code: [email or web address] URL: [email or web address] Date: 10/16/2022 Prepared by: Stacie Glaze  Exercises - Supine Shoulder Flexion AAROM with Hands Clasped  - 2 x daily - 7 x weekly - 1 sets - 10 reps - 3 hold - Seated Shoulder Flexion Towel Slide at Table Top  - 2 x daily - 7 x weekly - 1 sets - 10 reps - 3 hold - Seated Elbow Extension and Shoulder External Rotation AAROM at Table with Towel  - 2 x daily - 7 x weekly - 1 sets - 10 reps - 3 hold - Circular Shoulder Pendulum with Table Support  - 2 x daily - 7 x weekly - 1 sets - 10 reps - 1 hold - Standing Elbow Flexion Extension AROM  - 2 x daily - 7 x weekly - 1 sets - 10 reps - 3 hold - Seated Shoulder Shrug Circles AROM Backward  - 2 x daily - 7 x weekly - 1 sets - 10 reps - 3 hold - Seated Scapular Retraction  - 2 x daily - 7 x weekly - 1 sets - 10 reps - 3 hold  ASSESSMENT:  CLINICAL IMPRESSION: Patient returns with decreased pain levels.  We worked on some AAROM and gentle strengthening. She is able to get near normal limits for shoulder flexion and abduction using the pulleys. Most difficulty noted with ER/IR band exercise today.   OBJECTIVE IMPAIRMENTS: decreased activity tolerance, decreased coordination, decreased endurance, decreased ROM, decreased strength, increased edema, increased muscle spasms, impaired flexibility, impaired UE functional use, improper body mechanics, postural dysfunction, and pain.   REHAB POTENTIAL: Good  CLINICAL DECISION MAKING: Evolving/moderate complexity  EVALUATION COMPLEXITY: Low  GOALS: Goals reviewed with patient? Yes  SHORT TERM GOALS: Target date: 10/30/22  Independent with initial HEP Goal status: 10/20/22 MET  LONG TERM GOALS: Target date: 01/29/23  Independent with advanced HEP Goal status: INITIAL  2.  Do hair without difficulty Goal status:ongoing 10/27/22 and 11/02/22,  progressing  11/30/22  3.  Dress without difficulty Goal status:ongoing 10/27/22 progressing 11/02/22, progressing 11/30/22  4.  Decrease pain with motions by 50% Goal status: IN PROGRESS  5.  Increase AROM of the left shoulder to 130 degrees flexion Goal status: progressing 11/02/22  PLAN: PT FREQUENCY: 1-2x/week  PT DURATION: 12 weeks  PLANNED INTERVENTIONS: Therapeutic exercises, Therapeutic activity, Neuromuscular re-education, Balance training, Gait training, Patient/Family education, Self Care, Joint mobilization, Dry Needling, Electrical stimulation, Cryotherapy, Moist heat, Taping, Vasopneumatic device, and Manual therapy  PLAN FOR NEXT SESSION: HOLD, assess after MD appt   Cassie Freer, PT 12/06/2022, 3:30 PM [facility] [employer] [facility] [facility] at [facility]. [place], [place], [address] Phone: [phone]   Fax:  [phone] at [facility] [address] [place], [place], [address] Phone: [phone]   Fax:  [phone]  Patient Details  Name: Alicia [name] MRN: [id number] Date of Birth: 07-09-1955 Referring Provider:  Myrene Galas, [facility] at [facility]. [place], [place], [address] Phone: [phone]   Fax:  [phone]  Patient Details  Name: Alicia [name] MRN: [id number] Date of Birth: 01-01-1956 Referring Provider:  Myrene Galas, MD  Encounter Date: 12/06/2022   Cassie Freer, PT 12/06/2022, 3:30 PM  [facility] at [facility] [address] [place], [place], [address] Phone: [phone]   Fax:  [phone]

## 2022-12-06 ENCOUNTER — Ambulatory Visit: Payer: Medicare Other

## 2022-12-06 DIAGNOSIS — M6281 Muscle weakness (generalized): Secondary | ICD-10-CM

## 2022-12-06 DIAGNOSIS — M25512 Pain in left shoulder: Secondary | ICD-10-CM

## 2022-12-06 DIAGNOSIS — M25612 Stiffness of left shoulder, not elsewhere classified: Secondary | ICD-10-CM

## 2022-12-12 ENCOUNTER — Ambulatory Visit: Payer: Medicare Other | Admitting: Physical Therapy

## 2022-12-12 DIAGNOSIS — M6281 Muscle weakness (generalized): Secondary | ICD-10-CM

## 2022-12-12 DIAGNOSIS — M25512 Pain in left shoulder: Secondary | ICD-10-CM | POA: Diagnosis not present

## 2022-12-12 DIAGNOSIS — M25612 Stiffness of left shoulder, not elsewhere classified: Secondary | ICD-10-CM | POA: Diagnosis not present

## 2022-12-12 NOTE — Therapy (Signed)
OUTPATIENT PHYSICAL THERAPY UPPER EXTREMITY  Progress Note Reporting Period 10/16/22 to 12/12/22  See note below for Objective Data and Assessment of Progress/Goals.      Patient Name: Alicia [name] MRN: [id number] DOB:Jun 23, 1956, 67 y.o., female Today's Date: 12/12/2022  END OF SESSION:  PT End of Session - 12/12/22 0938     Visit Number 10    Date for PT Re-Evaluation 01/15/23    Authorization Type [employer]    PT Start Time 0935    PT Stop Time 1015    PT Time Calculation (min) 40 min             Past Medical History:  Diagnosis Date   Anemia    Arthritis    back and knees   ARTHRITIS, KNEE 09/17/2007   Asthma 04/04/2010   pt states she does not have asthma   Cataract 01/07/2019   Closed fracture of distal end of left radius 11/01/2015   Complete deafness    Meningitis at age 6   Deaf    Diabetes mellitus    Diabetic neuropathy (HCC)    Ganglion cyst 06/22/2011   Gastroparesis    GERD (gastroesophageal reflux disease)    H/O: C-section    Hyperlipidemia    Hypertension    Neuromuscular disorder (HCC)    diabetic neuropathy   PSVT (paroxysmal supraventricular tachycardia) 11/09/2017   Event monitor 09/14/2017 - Predominantly sinus rhythm with episodes of narrow-complex tachycardia suggestive of paroxysmal supraventricular tachycardia.   S/P appy    Past Surgical History:  Procedure Laterality Date   APPENDECTOMY     cardiolyte EF 77%, no ischemia in 2006  2006   CARPAL TUNNEL RELEASE Right 04/20/2015   Procedure: RIGHT CARPAL TUNNEL RELEASE;  Surgeon: Cindee Salt, MD;  Location: [facility];  Service: Orthopedics;  Laterality: Right;   CARPAL TUNNEL RELEASE Left 11/04/2015   Procedure: CARPAL TUNNEL RELEASE;  Surgeon: Cindee Salt, MD;  Location: [facility];  Service: Orthopedics;  Laterality: Left;   CESAREAN SECTION     OPEN REDUCTION INTERNAL FIXATION (ORIF) DISTAL RADIAL FRACTURE Left 11/04/2015   Procedure: OPEN REDUCTION INTERNAL  FIXATION (ORIF) LEFT DISTAL RADIAL FRACTURE POSSIBLE BONE GRAFT;  Surgeon: Cindee Salt, MD;  Location: [facility];  Service: Orthopedics;  Laterality: Left;   POSTERIOR CERVICAL FUSION/FORAMINOTOMY N/A 12/26/2021   Procedure: Posterior cervical fusion with lateral mass fixation - Cervical Three-Cervical Six, Cervical Laminectomy Cervical Three-Cervica; Five;  Surgeon: Tia Alert, MD;  Location: [facility] OR;  Service: Neurosurgery;  Laterality: N/A;   TRIGGER FINGER RELEASE Right 04/20/2015   Procedure: RELEASE TRIGGER FINGER/A-1 PULLEY RIGHT MIDDLE FINGER,RIGHT RING FINGER;  Surgeon: Cindee Salt, MD;  Location: [facility];  Service: Orthopedics;  Laterality: Right;   TUBAL LIGATION     ULNAR NERVE TRANSPOSITION Right 04/20/2015   Procedure: RIGHT ULNAR NERVE DECOMPRESSION;  Surgeon: Cindee Salt, MD;  Location: [facility];  Service: Orthopedics;  Laterality: Right;   UTERINE FIBROID EMBOLIZATION  2007   WRIST SURGERY     Cyst removed on left   WRIST SURGERY Right 1985   tendon repair R wrist   Patient Active Problem List   Diagnosis Date Noted   S/P appy 03/20/2022   Neuromuscular disorder (HCC) 03/20/2022   H/O: C-section 03/20/2022   Anemia 03/20/2022   Arthritis 03/20/2022   S/P cervical spinal fusion 12/26/2021   Cervical disc disorder with radiculopathy of cervical region 10/28/2021   Sternocleidomastoid muscle tenderness  09/26/2021   Musculoskeletal disorder involving upper trapezius muscle 08/05/2021   Back pain 06/20/2021   Urinary hesitancy 05/13/2021   Hemorrhoids 02/03/2021   Irritable bowel syndrome 11/22/2020   Acute viral sinusitis 11/12/2020   Bilateral sciatica 10/27/2020   Greater trochanteric bursitis of both hips 10/27/2020   Chronic idiopathic constipation 06/25/2020   Elevated blood sugar 06/10/2020   Hernia of abdominal wall 01/28/2020   Spinal stenosis at L4-L5 level 01/12/2020   Transient neurologic deficit 12/22/2019    Frequent loose stools 04/20/2019   Hyperthyroidism 03/07/2019   Hypertension 01/07/2019   Hypercalcemia 10/08/2018   Seasonal allergies 08/27/2018   Osteoarthritis involving multiple joints on both sides of body 05/25/2018   Statin intolerance 12/26/2017   PSVT (paroxysmal supraventricular tachycardia) 11/09/2017   Chest pain 08/30/2017   Hyperlipidemia 08/30/2017   OSA (obstructive sleep apnea) 08/31/2016   Healthcare maintenance 03/07/2016   ARTHRITIS, KNEE 11/01/2015   Diabetes (HCC) 09/14/2015   Muscle pain 09/28/2014   Seizure (HCC) 06/08/2014   Iron deficiency anemia 07/01/2012   Diabetic retinopathy (HCC) 07/21/2011   GERD 12/30/2007   Diabetic polyneuropathy (HCC) 08/30/2006   Type 2 diabetes mellitus with ophthalmic complication (HCC) 08/30/2006   HYPERCHOLESTEROLEMIA 08/30/2006   Hearing loss 08/30/2006    PCP: Barbaraann Faster, MD  REFERRING PROVIDER: Carola Frost, MD  REFERRING DIAG: left humeral fracture  THERAPY DIAG:  Acute pain of left shoulder  Stiffness of left shoulder, not elsewhere classified  Muscle weakness  Rationale for Evaluation and Treatment: Rehabilitation  ONSET DATE: 09/20/22  SUBJECTIVE:                                                                                                                                                                                      SUBJECTIVE STATEMENT:  upper arm is better, shld feel stuck   PERTINENT HISTORY: See above  PAIN:  Are you having pain? Yes: NPRS scale: 4/10 Pain location: left shoulder to the left elbow Pain description: very sharp electric pain Aggravating factors: any motions will increase pain up to 10/10 Relieving factors: in sling, not moving it pain can be 0/10  PRECAUTIONS: None  WEIGHT BEARING RESTRICTIONS: No  FALLS:  Has patient fallen in last 6 months? Yes. Number of falls 1  LIVING ENVIRONMENT: Lives with: lives with their family Lives in: House/apartment Stairs: Yes:  Internal: 26 steps; can reach both Has following equipment at home: None  OCCUPATION: retired  PLOF: Independent and with all ADL's  PATIENT GOALS: dress independently, less pain, lift a baby in May, drive  NEXT MD VISIT: 0/86/57  OBJECTIVE:   DIAGNOSTIC FINDINGS:  Comminuted humeral fracture  PATIENT SURVEYS :  FOTO 16  COGNITION: Overall cognitive status: Within functional limits for tasks assessed     SENSATION: WFL's  POSTURE: Fwd head, rounded shoulders  UPPER EXTREMITY ROM: Severe pain in the left shoulder with active motions  Passive ROM Left PROM eval Left AROM eval Left Standing  10/30/22 Left Standing 11/02/22 11/07/22 11/30/22  Shoulder flexion 85 20 70 100 110 115  Shoulder extension        Shoulder abduction      80  Shoulder adduction        Shoulder internal rotation 30 30 63 86 90 90  Shoulder external rotation 15 5      Elbow flexion 130 30      Elbow extension 5 514      Wrist flexion        Wrist extension        Wrist ulnar deviation        Wrist radial deviation        Wrist pronation        Wrist supination        (Blank rows = not tested)  UPPER EXTREMITY MMT:  NOT TESTED at EVAL  MMT Right eval Left eval  Shoulder flexion    Shoulder extension    Shoulder abduction    Shoulder adduction    Shoulder internal rotation    Shoulder external rotation    Middle trapezius    Lower trapezius    Elbow flexion    Elbow extension    Wrist flexion    Wrist extension    Wrist ulnar deviation    Wrist radial deviation    Wrist pronation    Wrist supination    Grip strength (lbs)    (Blank rows = not tested)  PALPATION:  Very tight and tender in the left upper trap and the left upper arm   TODAY'S TREATMENT:                                                                                                                                         DATE:   12/12/22 Nustep standing L 2 push /pull 2 min then horz abd 2 min AROM standing FLEX   105   and ABD 88 1 finger sublux MMT RT shld flex,abd and IR 3/5. ER 3-/5 Finger ladder flex and abd 8 x each up on ladder and eccentric lowering 4# shruggs backward rolls and shld ext and scap squeeze 2 sets 10 Cabinet reaching lower level 10 x Rows and ext red 2x10 ER /IR yellow band 2x10 Pulleys flexion and abd 2x10    12/06/22 UBE L1 x25mins each way  Wall slides flexion x10 Rows and ext red 2x10 ER /IR yellow band 2x10 Pulleys flexion and abd 2x10    12/04/22 UBE L1 x51mins  Supine PROM in all directions  Supine AAROM flexion x10 Supine chest press x10 Supine  1# ER/IR  Standing abd with dowel x10  Bicep curls 2# 2x10 Vaso to L shoulder 10 mins   11/30/22 Review goals UBE L1 x55mins  Finger ladder x5 flexion and abd  Wall slides flex x10  Red tband rows 2x10 1# standing cane flexion, ext x10  seated PROM in all directions  VASO after for pain and swelling 10 mins    11/07/22 Isometrics -pain with IR and abd PROM LEFt shld to tolerance, gentle Reviewed ALL HEP and adjusted   11/02/22 UBE L 1 3 min each way ROM taken and goals assessed ( left shld is subluxed 1.5 finger width) Wall slides flex,abd and circles AA 10 x each Red tband rows and extension 2 sets 10 1# standing cane ex 10 x each directions Supine PROM and isometrics VASO after for pain and swelling   10/27/22 UBE level 1 7fw/3bkwd 1# Wate bar biceps, extension Wall slides with assist, circles Ball rolls for flexion and circles Red tband rows and extension Thigh slides Shrugs ER/IR on sliding board Supine chest press Supine left shoulder isometrics Vaso medium pressure left shoulder  10/24/22 UBE L 1 2 min fwd, 1 min backward Finger ladder flex and Abd 5 x each Rolling ball on mat 4 ways 10 x each Supine cane chest press, shld flex ,ER and abd 10 x each Supine isometrics PROM Left shld supine VASO Left shld for pain control after session  10/20/22 UBE L 1 2 min fwd, 1 min backward with  pain Finger ladder flex and bad 5 x each Rolling ball on mat 4 ways 10 x each Standing 3# cane bicep 10 x Standing cane ex no wt for shld upright row, chest press, ER, IR and shld ext 10 x each Supine cane chest press, shld flex ,ER and abd 10 x each PROM LEft shld supine   Eval  PATIENT EDUCATION: Education details: HEP/POC Person educated: Patient Education method: Programmer, multimedia, Facilities manager, Actor cues, Verbal cues, and Handouts Education comprehension: verbalized understanding  HOME EXERCISE PROGRAM: Access Code: [email or web address] URL: [email or web address] Date: 10/16/2022 Prepared by: Stacie Glaze  Exercises - Supine Shoulder Flexion AAROM with Hands Clasped  - 2 x daily - 7 x weekly - 1 sets - 10 reps - 3 hold - Seated Shoulder Flexion Towel Slide at Table Top  - 2 x daily - 7 x weekly - 1 sets - 10 reps - 3 hold - Seated Elbow Extension and Shoulder External Rotation AAROM at Table with Towel  - 2 x daily - 7 x weekly - 1 sets - 10 reps - 3 hold - Circular Shoulder Pendulum with Table Support  - 2 x daily - 7 x weekly - 1 sets - 10 reps - 1 hold - Standing Elbow Flexion Extension AROM  - 2 x daily - 7 x weekly - 1 sets - 10 reps - 3 hold - Seated Shoulder Shrug Circles AROM Backward  - 2 x daily - 7 x weekly - 1 sets - 10 reps - 3 hold - Seated Scapular Retraction  - 2 x daily - 7 x weekly - 1 sets - 10 reps - 3 hold  ASSESSMENT:  CLINICAL IMPRESSION: Pt states pain has returned some in shld , upper arm better. Pt does have a noteable 1 finger sublex. ROM ,MMT and goals assessed and documented. Cuing for there ex for ROM and strengthening OBJECTIVE IMPAIRMENTS: decreased activity tolerance, decreased coordination, decreased endurance, decreased ROM, decreased strength, increased edema, increased muscle spasms, impaired flexibility,  impaired UE functional use, improper body mechanics, postural dysfunction, and pain.   REHAB POTENTIAL: Good  CLINICAL DECISION  MAKING: Evolving/moderate complexity  EVALUATION COMPLEXITY: Low  GOALS: Goals reviewed with patient? Yes  SHORT TERM GOALS: Target date: 10/30/22  Independent with initial HEP Goal status: 10/20/22 MET  LONG TERM GOALS: Target date: 01/29/23  Independent with advanced HEP Goal status: 12/12/22 evolving  2.  Do hair without difficulty Goal status:ongoing 10/27/22 and 11/02/22, progressing 11/30/22  6/11/ 24 progressing  3.  Dress without difficulty Goal status:ongoing 10/27/22 progressing 11/02/22, progressing 11/30/22 12/12/22 progressing  4.  Decrease pain with motions by 50% Goal status: IN PROGRESS 12/12/22  5.  Increase AROM of the left shoulder to 130 degrees flexion Goal status: progressing 11/02/22  progressing 12/12/22  PLAN: PT FREQUENCY: 1-2x/week  PT DURATION: 12 weeks  PLANNED INTERVENTIONS: Therapeutic exercises, Therapeutic activity, Neuromuscular re-education, Balance training, Gait training, Patient/Family education, Self Care, Joint mobilization, Dry Needling, Electrical stimulation, Cryotherapy, Moist heat, Taping, Vasopneumatic device, and Manual therapy  PLAN FOR NEXT SESSION: progress ROM and strength  Zaray Gatchel,ANGIE, PTA 12/12/2022, 9:39 AM [facility] at [facility] [address] [place], [place], [address] Phone: [phone]   Fax:  [phone] at [facility] [address] [place], [place], [address] Phone: [phone]   Fax:  [phone]  Patient Details  Name: Alicia [name] MRN: [id number] Date of Birth: 1956/02/18 Referring Provider:  Myrene Galas, [facility] at [facility]. [place], [place], [address] Phone: [phone]   Fax:  [phone]  Patient Details  Name: Alicia [name] MRN: [id number] Date of Birth: 1956/04/21 Referring Provider:  Myrene Galas, MD  Encounter Date:  12/12/2022   Suanne Marker, PTA 12/12/2022, 9:39 AM  [place]  Out[facility] at [facility] [address] [place], [place], [address] Phone: [phone]   Fax:  [phone] at [facility] [address] [place], [place], [address] Phone: [phone]   Fax:  [phone]

## 2022-12-14 ENCOUNTER — Ambulatory Visit: Payer: Medicare Other

## 2022-12-15 ENCOUNTER — Other Ambulatory Visit: Payer: Self-pay | Admitting: Family Medicine

## 2022-12-19 ENCOUNTER — Ambulatory Visit: Payer: Medicare Other | Admitting: Physical Therapy

## 2022-12-19 DIAGNOSIS — M25512 Pain in left shoulder: Secondary | ICD-10-CM | POA: Diagnosis not present

## 2022-12-19 DIAGNOSIS — M25612 Stiffness of left shoulder, not elsewhere classified: Secondary | ICD-10-CM

## 2022-12-19 DIAGNOSIS — M6281 Muscle weakness (generalized): Secondary | ICD-10-CM | POA: Diagnosis not present

## 2022-12-19 NOTE — Therapy (Signed)
OUTPATIENT PHYSICAL THERAPY UPPER EXTREMITY  Progress Note Reporting Period 10/16/22 to 12/12/22  See note below for Objective Data and Assessment of Progress/Goals.      Patient Name: Alicia [name] MRN: [id number] DOB:03/27/1956, 67 y.o., female Today's Date: 12/19/2022  END OF SESSION:  PT End of Session - 12/19/22 0936     Visit Number 11    Date for PT Re-Evaluation 01/15/23    Authorization Type BCBS    PT Start Time [phone]    PT Stop Time 1015    PT Time Calculation (min) 39 min             Past Medical History:  Diagnosis Date   Anemia    Arthritis    back and knees   ARTHRITIS, KNEE 09/17/2007   Asthma 04/04/2010   pt states she does not have asthma   Cataract 01/07/2019   Closed fracture of distal end of left radius 11/01/2015   Complete deafness    Meningitis at age 65   Deaf    Diabetes mellitus    Diabetic neuropathy (HCC)    Ganglion cyst 06/22/2011   Gastroparesis    GERD (gastroesophageal reflux disease)    H/O: C-section    Hyperlipidemia    Hypertension    Neuromuscular disorder (HCC)    diabetic neuropathy   PSVT (paroxysmal supraventricular tachycardia) 11/09/2017   Event monitor 09/14/2017 - Predominantly sinus rhythm with episodes of narrow-complex tachycardia suggestive of paroxysmal supraventricular tachycardia.   S/P appy    Past Surgical History:  Procedure Laterality Date   APPENDECTOMY     cardiolyte EF 77%, no ischemia in 2006  2006   CARPAL TUNNEL RELEASE Right 04/20/2015   Procedure: RIGHT CARPAL TUNNEL RELEASE;  Surgeon: Cindee Salt, MD;  Location: [facility];  Service: Orthopedics;  Laterality: Right;   CARPAL TUNNEL RELEASE Left 11/04/2015   Procedure: CARPAL TUNNEL RELEASE;  Surgeon: Cindee Salt, MD;  Location: [facility];  Service: Orthopedics;  Laterality: Left;   CESAREAN SECTION     OPEN REDUCTION INTERNAL FIXATION (ORIF) DISTAL RADIAL FRACTURE Left 11/04/2015   Procedure: OPEN REDUCTION INTERNAL  FIXATION (ORIF) LEFT DISTAL RADIAL FRACTURE POSSIBLE BONE GRAFT;  Surgeon: Cindee Salt, MD;  Location: [facility];  Service: Orthopedics;  Laterality: Left;   POSTERIOR CERVICAL FUSION/FORAMINOTOMY N/A 12/26/2021   Procedure: Posterior cervical fusion with lateral mass fixation - Cervical Three-Cervical Six, Cervical Laminectomy Cervical Three-Cervica; Five;  Surgeon: Tia Alert, MD;  Location: [facility] OR;  Service: Neurosurgery;  Laterality: N/A;   TRIGGER FINGER RELEASE Right 04/20/2015   Procedure: RELEASE TRIGGER FINGER/A-1 PULLEY RIGHT MIDDLE FINGER,RIGHT RING FINGER;  Surgeon: Cindee Salt, MD;  Location: [facility];  Service: Orthopedics;  Laterality: Right;   TUBAL LIGATION     ULNAR NERVE TRANSPOSITION Right 04/20/2015   Procedure: RIGHT ULNAR NERVE DECOMPRESSION;  Surgeon: Cindee Salt, MD;  Location: [facility];  Service: Orthopedics;  Laterality: Right;   UTERINE FIBROID EMBOLIZATION  2007   WRIST SURGERY     Cyst removed on left   WRIST SURGERY Right 1985   tendon repair R wrist   Patient Active Problem List   Diagnosis Date Noted   S/P appy 03/20/2022   Neuromuscular disorder (HCC) 03/20/2022   H/O: C-section 03/20/2022   Anemia 03/20/2022   Arthritis 03/20/2022   S/P cervical spinal fusion 12/26/2021   Cervical disc disorder with radiculopathy of cervical region 10/28/2021   Sternocleidomastoid muscle tenderness  09/26/2021   Musculoskeletal disorder involving upper trapezius muscle 08/05/2021   Back pain 06/20/2021   Urinary hesitancy 05/13/2021   Hemorrhoids 02/03/2021   Irritable bowel syndrome 11/22/2020   Acute viral sinusitis 11/12/2020   Bilateral sciatica 10/27/2020   Greater trochanteric bursitis of both hips 10/27/2020   Chronic idiopathic constipation 06/25/2020   Elevated blood sugar 06/10/2020   Hernia of abdominal wall 01/28/2020   Spinal stenosis at L4-L5 level 01/12/2020   Transient neurologic deficit 12/22/2019    Frequent loose stools 04/20/2019   Hyperthyroidism 03/07/2019   Hypertension 01/07/2019   Hypercalcemia 10/08/2018   Seasonal allergies 08/27/2018   Osteoarthritis involving multiple joints on both sides of body 05/25/2018   Statin intolerance 12/26/2017   PSVT (paroxysmal supraventricular tachycardia) 11/09/2017   Chest pain 08/30/2017   Hyperlipidemia 08/30/2017   OSA (obstructive sleep apnea) 08/31/2016   Healthcare maintenance 03/07/2016   ARTHRITIS, KNEE 11/01/2015   Diabetes (HCC) 09/14/2015   Muscle pain 09/28/2014   Seizure (HCC) 06/08/2014   Iron deficiency anemia 07/01/2012   Diabetic retinopathy (HCC) 07/21/2011   GERD 12/30/2007   Diabetic polyneuropathy (HCC) 08/30/2006   Type 2 diabetes mellitus with ophthalmic complication (HCC) 08/30/2006   HYPERCHOLESTEROLEMIA 08/30/2006   Hearing loss 08/30/2006    PCP: Barbaraann Faster, MD  REFERRING PROVIDER: Carola Frost, MD  REFERRING DIAG: left humeral fracture  THERAPY DIAG:  Acute pain of left shoulder  Stiffness of left shoulder, not elsewhere classified  Muscle weakness  Rationale for Evaluation and Treatment: Rehabilitation  ONSET DATE: 09/20/22  SUBJECTIVE:                                                                                                                                                                                      SUBJECTIVE STATEMENT: shoulder is awful. Seeing MD tomorrow   PERTINENT HISTORY: See above  PAIN:  Are you having pain? Yes: NPRS scale: 8/10 Pain location: left shoulder to the left elbow Pain description: very sharp electric pain Aggravating factors: any motions will increase pain up to 10/10 Relieving factors: in sling, not moving it pain can be 0/10  PRECAUTIONS: None  WEIGHT BEARING RESTRICTIONS: No  FALLS:  Has patient fallen in last 6 months? Yes. Number of falls 1  LIVING ENVIRONMENT: Lives with: lives with their family Lives in: House/apartment Stairs: Yes:  Internal: 26 steps; can reach both Has following equipment at home: None  OCCUPATION: retired  PLOF: Independent and with all ADL's  PATIENT GOALS: dress independently, less pain, lift a baby in May, drive  NEXT MD VISIT: 1/61/09  OBJECTIVE:   DIAGNOSTIC FINDINGS:  Comminuted humeral fracture  PATIENT SURVEYS :  FOTO 16  COGNITION: Overall cognitive status: Within functional limits for tasks assessed     SENSATION: WFL's  POSTURE: Fwd head, rounded shoulders  UPPER EXTREMITY ROM: Severe pain in the left shoulder with active motions  Passive ROM Left PROM eval Left AROM eval Left Standing  10/30/22 Left Standing 11/02/22 11/07/22 11/30/22  Shoulder flexion 85 20 70 100 110 115  Shoulder extension        Shoulder abduction      80  Shoulder adduction        Shoulder internal rotation 30 30 63 86 90 90  Shoulder external rotation 15 5      Elbow flexion 130 30      Elbow extension 5 514      Wrist flexion        Wrist extension        Wrist ulnar deviation        Wrist radial deviation        Wrist pronation        Wrist supination        (Blank rows = not tested)  UPPER EXTREMITY MMT:  NOT TESTED at EVAL  MMT Right eval Left eval  Shoulder flexion    Shoulder extension    Shoulder abduction    Shoulder adduction    Shoulder internal rotation    Shoulder external rotation    Middle trapezius    Lower trapezius    Elbow flexion    Elbow extension    Wrist flexion    Wrist extension    Wrist ulnar deviation    Wrist radial deviation    Wrist pronation    Wrist supination    Grip strength (lbs)    (Blank rows = not tested)  PALPATION:  Very tight and tender in the left upper trap and the left upper arm   TODAY'S TREATMENT:                                                                                                                                         DATE:   12/19/22 Nustep standing L 2 push /pull 2 min then horz abd 2 min UBE L 1 2 min fwd  and 2 min back KT tape to left shld to help with sublux- instructed in wear and to remove if bothersome Rows and ext red 2x10 ER /IR yellow band 2x10 ( AA with ER) Pulleys flexion and abd 2x10  4# shruggs backward rolls and shld ext and scap squeeze 2 sets 10    12/12/22 Nustep standing L 2 push /pull 2 min then horz abd 2 min AROM standing FLEX  105   and ABD 88 1 finger sublux MMT RT shld flex,abd and IR 3/5. ER 3-/5 Finger ladder flex and abd 8 x each up on ladder and eccentric lowering 4# shruggs backward rolls and shld ext and scap squeeze 2 sets  10 Cabinet reaching lower level 10 x Rows and ext red 2x10 ER /IR yellow band 2x10 Pulleys flexion and abd 2x10    12/06/22 UBE L1 x65mins each way  Wall slides flexion x10 Rows and ext red 2x10 ER /IR yellow band 2x10 Pulleys flexion and abd 2x10    12/04/22 UBE L1 x26mins  Supine PROM in all directions  Supine AAROM flexion x10 Supine chest press x10 Supine 1# ER/IR  Standing abd with dowel x10  Bicep curls 2# 2x10 Vaso to L shoulder 10 mins   11/30/22 Review goals UBE L1 x79mins  Finger ladder x5 flexion and abd  Wall slides flex x10  Red tband rows 2x10 1# standing cane flexion, ext x10  seated PROM in all directions  VASO after for pain and swelling 10 mins    11/07/22 Isometrics -pain with IR and abd PROM LEFt shld to tolerance, gentle Reviewed ALL HEP and adjusted   11/02/22 UBE L 1 3 min each way ROM taken and goals assessed ( left shld is subluxed 1.5 finger width) Wall slides flex,abd and circles AA 10 x each Red tband rows and extension 2 sets 10 1# standing cane ex 10 x each directions Supine PROM and isometrics VASO after for pain and swelling   10/27/22 UBE level 1 59fw/3bkwd 1# Wate bar biceps, extension Wall slides with assist, circles Ball rolls for flexion and circles Red tband rows and extension Thigh slides Shrugs ER/IR on sliding board Supine chest press Supine left shoulder  isometrics Vaso medium pressure left shoulder  10/24/22 UBE L 1 2 min fwd, 1 min backward Finger ladder flex and Abd 5 x each Rolling ball on mat 4 ways 10 x each Supine cane chest press, shld flex ,ER and abd 10 x each Supine isometrics PROM Left shld supine VASO Left shld for pain control after session  10/20/22 UBE L 1 2 min fwd, 1 min backward with pain Finger ladder flex and bad 5 x each Rolling ball on mat 4 ways 10 x each Standing 3# cane bicep 10 x Standing cane ex no wt for shld upright row, chest press, ER, IR and shld ext 10 x each Supine cane chest press, shld flex ,ER and abd 10 x each PROM LEft shld supine   Eval  PATIENT EDUCATION: Education details: HEP/POC Person educated: Patient Education method: Programmer, multimedia, Facilities manager, Actor cues, Verbal cues, and Handouts Education comprehension: verbalized understanding  HOME EXERCISE PROGRAM: Access Code: [email or web address] URL: [email or web address] Date: 10/16/2022 Prepared by: Stacie Glaze  Exercises - Supine Shoulder Flexion AAROM with Hands Clasped  - 2 x daily - 7 x weekly - 1 sets - 10 reps - 3 hold - Seated Shoulder Flexion Towel Slide at Table Top  - 2 x daily - 7 x weekly - 1 sets - 10 reps - 3 hold - Seated Elbow Extension and Shoulder External Rotation AAROM at Table with Towel  - 2 x daily - 7 x weekly - 1 sets - 10 reps - 3 hold - Circular Shoulder Pendulum with Table Support  - 2 x daily - 7 x weekly - 1 sets - 10 reps - 1 hold - Standing Elbow Flexion Extension AROM  - 2 x daily - 7 x weekly - 1 sets - 10 reps - 3 hold - Seated Shoulder Shrug Circles AROM Backward  - 2 x daily - 7 x weekly - 1 sets - 10 reps - 3 hold - Seated Scapular Retraction  - 2 x  daily - 7 x weekly - 1 sets - 10 reps - 3 hold  ASSESSMENT:  CLINICAL IMPRESSION: pt with increased pain since resuming PT, we discussed this is what happen last time with PT. Pt to discuss with MD tomorrow. OBJECTIVE IMPAIRMENTS:  decreased activity tolerance, decreased coordination, decreased endurance, decreased ROM, decreased strength, increased edema, increased muscle spasms, impaired flexibility, impaired UE functional use, improper body mechanics, postural dysfunction, and pain.   REHAB POTENTIAL: Good  CLINICAL DECISION MAKING: Evolving/moderate complexity  EVALUATION COMPLEXITY: Low  GOALS: Goals reviewed with patient? Yes  SHORT TERM GOALS: Target date: 10/30/22  Independent with initial HEP Goal status: 10/20/22 MET  LONG TERM GOALS: Target date: 01/29/23  Independent with advanced HEP Goal status: 12/12/22 evolving  2.  Do hair without difficulty Goal status:ongoing 10/27/22 and 11/02/22, progressing 11/30/22  6/11/ 24 progressing  3.  Dress without difficulty Goal status:ongoing 10/27/22 progressing 11/02/22, progressing 11/30/22 12/12/22 progressing  4.  Decrease pain with motions by 50% Goal status: IN PROGRESS 12/12/22  5.  Increase AROM of the left shoulder to 130 degrees flexion Goal status: progressing 11/02/22  progressing 12/12/22  PLAN: PT FREQUENCY: 1-2x/week  PT DURATION: 12 weeks  PLANNED INTERVENTIONS: Therapeutic exercises, Therapeutic activity, Neuromuscular re-education, Balance training, Gait training, Patient/Family education, Self Care, Joint mobilization, Dry Needling, Electrical stimulation, Cryotherapy, Moist heat, Taping, Vasopneumatic device, and Manual therapy  PLAN FOR NEXT SESSION: assess MD appt  Jeralyn Nolden,ANGIE, PTA 12/19/2022, 9:37 AM [facility] at [facility] [address] [facility] [place], [place], [address] Phone: [phone]   Fax:  [phone] at [facility] [address] [place], [place], [address] Phone: [phone]   Fax:  [phone]  Patient Details  Name: Alicia [name] MRN: [id number] Date of Birth: [birth year] Referring Provider:  Myrene Galas, M[facility] Out[facility] at [facility]. [place], [place], [address] Phone: [phone]   Fax:  [phone]  Patient Details  Name: Alicia [name] MRN: [id number] Date of Birth: 1955/07/16 Referring Provider:  Myrene Galas, MD  Encounter Date: 12/19/2022   Suanne Marker, PTA 12/19/2022, 9:37 AM  [facility] at [facility] [address] [place], [place], [address] Phone: [phone]   Fax:  [phone] at [facility] [address] [facility] [address] [place], [place], [address] Phone: [phone]   Fax:  [phone] at [facility] [address] [facility] [address]. [place], [place], [address] Phone: [phone]   Fax:  [phone]  Patient Details  Name: Alicia [name] MRN: [id number] Date of Birth: 08-12-1955 Referring Provider:  Myrene Galas, MD  Encounter Date: 12/19/2022   Suanne Marker, PTA 12/19/2022, 9:37 AM  [place]  Out[facility] at [facility] [address] [facility] [place], [place], [address] Phone: [phone]   Fax:  [phone]

## 2022-12-20 DIAGNOSIS — S42232K 3-part fracture of surgical neck of left humerus, subsequent encounter for fracture with nonunion: Secondary | ICD-10-CM | POA: Diagnosis not present

## 2022-12-21 ENCOUNTER — Ambulatory Visit: Payer: Medicare Other | Admitting: Physical Therapy

## 2022-12-26 ENCOUNTER — Ambulatory Visit: Payer: Medicare Other | Admitting: Physical Therapy

## 2022-12-26 NOTE — Therapy (Signed)
OUTPATIENT PHYSICAL THERAPY UPPER EXTREMITY   Patient Name: Alicia [name] MRN: [id number] DOB:June 23, 1956, 67 y.o., female Today's Date: 12/26/2022  END OF SESSION:  PT End of Session - 12/26/22 0931     Visit Number 12    Date for PT Re-Evaluation 01/15/23    Authorization Type BCBS    PT Start Time [phone]    PT Stop Time 1015    PT Time Calculation (min) 44 min             Past Medical History:  Diagnosis Date   Anemia    Arthritis    back and knees   ARTHRITIS, KNEE 09/17/2007   Asthma 04/04/2010   pt states she does not have asthma   Cataract 01/07/2019   Closed fracture of distal end of left radius 11/01/2015   Complete deafness    Meningitis at age 38   Deaf    Diabetes mellitus    Diabetic neuropathy (HCC)    Ganglion cyst 06/22/2011   Gastroparesis    GERD (gastroesophageal reflux disease)    H/O: C-section    Hyperlipidemia    Hypertension    Neuromuscular disorder (HCC)    diabetic neuropathy   PSVT (paroxysmal supraventricular tachycardia) 11/09/2017   Event monitor 09/14/2017 - Predominantly sinus rhythm with episodes of narrow-complex tachycardia suggestive of paroxysmal supraventricular tachycardia.   S/P appy    Past Surgical History:  Procedure Laterality Date   APPENDECTOMY     cardiolyte EF 77%, no ischemia in 2006  2006   CARPAL TUNNEL RELEASE Right 04/20/2015   Procedure: RIGHT CARPAL TUNNEL RELEASE;  Surgeon: Cindee Salt, MD;  Location: [facility];  Service: Orthopedics;  Laterality: Right;   CARPAL TUNNEL RELEASE Left 11/04/2015   Procedure: CARPAL TUNNEL RELEASE;  Surgeon: Cindee Salt, MD;  Location: [facility];  Service: Orthopedics;  Laterality: Left;   CESAREAN SECTION     OPEN REDUCTION INTERNAL FIXATION (ORIF) DISTAL RADIAL FRACTURE Left 11/04/2015   Procedure: OPEN REDUCTION INTERNAL FIXATION (ORIF) LEFT DISTAL RADIAL FRACTURE POSSIBLE BONE GRAFT;  Surgeon: Cindee Salt, MD;  Location: [facility];   Service: Orthopedics;  Laterality: Left;   POSTERIOR CERVICAL FUSION/FORAMINOTOMY N/A 12/26/2021   Procedure: Posterior cervical fusion with lateral mass fixation - Cervical Three-Cervical Six, Cervical Laminectomy Cervical Three-Cervica; Five;  Surgeon: Tia Alert, MD;  Location: [facility] OR;  Service: Neurosurgery;  Laterality: N/A;   TRIGGER FINGER RELEASE Right 04/20/2015   Procedure: RELEASE TRIGGER FINGER/A-1 PULLEY RIGHT MIDDLE FINGER,RIGHT RING FINGER;  Surgeon: Cindee Salt, MD;  Location: [facility];  Service: Orthopedics;  Laterality: Right;   TUBAL LIGATION     ULNAR NERVE TRANSPOSITION Right 04/20/2015   Procedure: RIGHT ULNAR NERVE DECOMPRESSION;  Surgeon: Cindee Salt, MD;  Location:  SURGERY CENTER;  Service: Orthopedics;  Laterality: Right;   UTERINE FIBROID EMBOLIZATION  2007   WRIST SURGERY     Cyst removed on left   WRIST SURGERY Right 1985   tendon repair R wrist   Patient Active Problem List   Diagnosis Date Noted   S/P appy 03/20/2022   Neuromuscular disorder (HCC) 03/20/2022   H/O: C-section 03/20/2022   Anemia 03/20/2022   Arthritis 03/20/2022   S/P cervical spinal fusion 12/26/2021   Cervical disc disorder with radiculopathy of cervical region 10/28/2021   Sternocleidomastoid muscle tenderness 09/26/2021   Musculoskeletal disorder involving upper trapezius muscle 08/05/2021   Back pain 06/20/2021   Urinary hesitancy 05/13/2021  Hemorrhoids 02/03/2021   Irritable bowel syndrome 11/22/2020   Acute viral sinusitis 11/12/2020   Bilateral sciatica 10/27/2020   Greater trochanteric bursitis of both hips 10/27/2020   Chronic idiopathic constipation 06/25/2020   Elevated blood sugar 06/10/2020   Hernia of abdominal wall 01/28/2020   Spinal stenosis at L4-L5 level 01/12/2020   Transient neurologic deficit 12/22/2019   Frequent loose stools 04/20/2019   Hyperthyroidism 03/07/2019   Hypertension 01/07/2019   Hypercalcemia 10/08/2018    Seasonal allergies 08/27/2018   Osteoarthritis involving multiple joints on both sides of body 05/25/2018   Statin intolerance 12/26/2017   PSVT (paroxysmal supraventricular tachycardia) 11/09/2017   Chest pain 08/30/2017   Hyperlipidemia 08/30/2017   OSA (obstructive sleep apnea) 08/31/2016   Healthcare maintenance 03/07/2016   ARTHRITIS, KNEE 11/01/2015   Diabetes (HCC) 09/14/2015   Muscle pain 09/28/2014   Seizure (HCC) 06/08/2014   Iron deficiency anemia 07/01/2012   Diabetic retinopathy (HCC) 07/21/2011   GERD 12/30/2007   Diabetic polyneuropathy (HCC) 08/30/2006   Type 2 diabetes mellitus with ophthalmic complication (HCC) 08/30/2006   HYPERCHOLESTEROLEMIA 08/30/2006   Hearing loss 08/30/2006    PCP: Barbaraann Faster, MD  REFERRING PROVIDER: Carola Frost, MD  REFERRING DIAG: left humeral fracture  THERAPY DIAG:  Acute pain of left shoulder  Stiffness of left shoulder, not elsewhere classified  Muscle weakness  Rationale for Evaluation and Treatment: Rehabilitation  ONSET DATE: 09/20/22  SUBJECTIVE:                                                                                                                                                                                      SUBJECTIVE STATEMENT: pt arrived stating MD said CT scan and possible surgery unsure about PT   PERTINENT HISTORY: See above  PAIN:  Are you having pain? Yes: NPRS scale: 8/10 Pain location: left shoulder to the left elbow Pain description: very sharp electric pain Aggravating factors: any motions will increase pain up to 10/10 Relieving factors: in sling, not moving it pain can be 0/10  PRECAUTIONS: None  WEIGHT BEARING RESTRICTIONS: No  FALLS:  Has patient fallen in last 6 months? Yes. Number of falls 1  LIVING ENVIRONMENT: Lives with: lives with their family Lives in: House/apartment Stairs: Yes: Internal: 26 steps; can reach both Has following equipment at home:  None  OCCUPATION: retired  PLOF: Independent and with all ADL's  PATIENT GOALS: dress independently, less pain, lift a baby in May, drive  NEXT MD VISIT: 1/61/09  OBJECTIVE:   DIAGNOSTIC FINDINGS:  Comminuted humeral fracture  PATIENT SURVEYS :  FOTO 16  COGNITION: Overall cognitive status: Within functional limits for tasks assessed  SENSATION: WFL's  POSTURE: Fwd head, rounded shoulders  UPPER EXTREMITY ROM: Severe pain in the left shoulder with active motions  Passive ROM Left PROM eval Left AROM eval Left Standing  10/30/22 Left Standing 11/02/22 11/07/22 11/30/22  Shoulder flexion 85 20 70 100 110 115  Shoulder extension        Shoulder abduction      80  Shoulder adduction        Shoulder internal rotation 30 30 63 86 90 90  Shoulder external rotation 15 5      Elbow flexion 130 30      Elbow extension 5 514      Wrist flexion        Wrist extension        Wrist ulnar deviation        Wrist radial deviation        Wrist pronation        Wrist supination        (Blank rows = not tested)  UPPER EXTREMITY MMT:  NOT TESTED at EVAL  MMT Right eval Left eval  Shoulder flexion    Shoulder extension    Shoulder abduction    Shoulder adduction    Shoulder internal rotation    Shoulder external rotation    Middle trapezius    Lower trapezius    Elbow flexion    Elbow extension    Wrist flexion    Wrist extension    Wrist ulnar deviation    Wrist radial deviation    Wrist pronation    Wrist supination    Grip strength (lbs)    (Blank rows = not tested)  PALPATION:  Very tight and tender in the left upper trap and the left upper arm   TODAY'S TREATMENT:                                                                                                                                         DATE:   12/19/22 Nustep standing L 2 push /pull 2 min then horz abd 2 min UBE L 1 2 min fwd and 2 min back KT tape to left shld to help with sublux- instructed  in wear and to remove if bothersome Rows and ext red 2x10 ER /IR yellow band 2x10 ( AA with ER) Pulleys flexion and abd 2x10  4# shruggs backward rolls and shld ext and scap squeeze 2 sets 10    12/12/22 Nustep standing L 2 push /pull 2 min then horz abd 2 min AROM standing FLEX  105   and ABD 88 1 finger sublux MMT RT shld flex,abd and IR 3/5. ER 3-/5 Finger ladder flex and abd 8 x each up on ladder and eccentric lowering 4# shruggs backward rolls and shld ext and scap squeeze 2 sets 10 Cabinet reaching lower level 10 x Rows and ext red 2x10 ER Gerald Leitz  yellow band 2x10 Pulleys flexion and abd 2x10    12/06/22 UBE L1 x61mins each way  Wall slides flexion x10 Rows and ext red 2x10 ER /IR yellow band 2x10 Pulleys flexion and abd 2x10    12/04/22 UBE L1 x49mins  Supine PROM in all directions  Supine AAROM flexion x10 Supine chest press x10 Supine 1# ER/IR  Standing abd with dowel x10  Bicep curls 2# 2x10 Vaso to L shoulder 10 mins   11/30/22 Review goals UBE L1 x66mins  Finger ladder x5 flexion and abd  Wall slides flex x10  Red tband rows 2x10 1# standing cane flexion, ext x10  seated PROM in all directions  VASO after for pain and swelling 10 mins    11/07/22 Isometrics -pain with IR and abd PROM LEFt shld to tolerance, gentle Reviewed ALL HEP and adjusted   11/02/22 UBE L 1 3 min each way ROM taken and goals assessed ( left shld is subluxed 1.5 finger width) Wall slides flex,abd and circles AA 10 x each Red tband rows and extension 2 sets 10 1# standing cane ex 10 x each directions Supine PROM and isometrics VASO after for pain and swelling   10/27/22 UBE level 1 85fw/3bkwd 1# Wate bar biceps, extension Wall slides with assist, circles Ball rolls for flexion and circles Red tband rows and extension Thigh slides Shrugs ER/IR on sliding board Supine chest press Supine left shoulder isometrics Vaso medium pressure left shoulder  10/24/22 UBE L 1 2 min fwd, 1  min backward Finger ladder flex and Abd 5 x each Rolling ball on mat 4 ways 10 x each Supine cane chest press, shld flex ,ER and abd 10 x each Supine isometrics PROM Left shld supine VASO Left shld for pain control after session  10/20/22 UBE L 1 2 min fwd, 1 min backward with pain Finger ladder flex and bad 5 x each Rolling ball on mat 4 ways 10 x each Standing 3# cane bicep 10 x Standing cane ex no wt for shld upright row, chest press, ER, IR and shld ext 10 x each Supine cane chest press, shld flex ,ER and abd 10 x each PROM LEft shld supine   Eval  PATIENT EDUCATION: Education details: HEP/POC Person educated: Patient Education method: Programmer, multimedia, Facilities manager, Actor cues, Verbal cues, and Handouts Education comprehension: verbalized understanding  HOME EXERCISE PROGRAM: Access Code: [email or web address] URL: [email or web address] Date: 10/16/2022 Prepared by: Stacie Glaze  Exercises - Supine Shoulder Flexion AAROM with Hands Clasped  - 2 x daily - 7 x weekly - 1 sets - 10 reps - 3 hold - Seated Shoulder Flexion Towel Slide at Table Top  - 2 x daily - 7 x weekly - 1 sets - 10 reps - 3 hold - Seated Elbow Extension and Shoulder External Rotation AAROM at Table with Towel  - 2 x daily - 7 x weekly - 1 sets - 10 reps - 3 hold - Circular Shoulder Pendulum with Table Support  - 2 x daily - 7 x weekly - 1 sets - 10 reps - 1 hold - Standing Elbow Flexion Extension AROM  - 2 x daily - 7 x weekly - 1 sets - 10 reps - 3 hold - Seated Shoulder Shrug Circles AROM Backward  - 2 x daily - 7 x weekly - 1 sets - 10 reps - 3 hold - Seated Scapular Retraction  - 2 x daily - 7 x weekly - 1 sets - 10 reps - 3 hold  ASSESSMENT:  CLINICAL IMPRESSION: no treatment as we decided to hold. Pt arrived stating shld worse with PT . MD ordering CTs can and possible surgery. LM and  MD to call back re: PT OBJECTIVE IMPAIRMENTS: decreased activity tolerance, decreased coordination, decreased  endurance, decreased ROM, decreased strength, increased edema, increased muscle spasms, impaired flexibility, impaired UE functional use, improper body mechanics, postural dysfunction, and pain.   REHAB POTENTIAL: Good  CLINICAL DECISION MAKING: Evolving/moderate complexity  EVALUATION COMPLEXITY: Low  GOALS: Goals reviewed with patient? Yes  SHORT TERM GOALS: Target date: 10/30/22  Independent with initial HEP Goal status: 10/20/22 MET  LONG TERM GOALS: Target date: 01/29/23  Independent with advanced HEP Goal status: 12/12/22 evolving  2.  Do hair without difficulty Goal status:ongoing 10/27/22 and 11/02/22, progressing 11/30/22  6/11/ 24 progressing  3.  Dress without difficulty Goal status:ongoing 10/27/22 progressing 11/02/22, progressing 11/30/22 12/12/22 progressing  4.  Decrease pain with motions by 50% Goal status: IN PROGRESS 12/12/22  5.  Increase AROM of the left shoulder to 130 degrees flexion Goal status: progressing 11/02/22  progressing 12/12/22  PLAN: PT FREQUENCY: 1-2x/week  PT DURATION: 12 weeks  PLANNED INTERVENTIONS: Therapeutic exercises, Therapeutic activity, Neuromuscular re-education, Balance training, Gait training, Patient/Family education, Self Care, Joint mobilization, Dry Needling, Electrical stimulation, Cryotherapy, Moist heat, Taping, Vasopneumatic device, and Manual therapy  PLAN FOR NEXT SESSION: awaiting MD call back  Erlinda Solinger,ANGIE, PTA 12/26/2022, 9:32 AM [facility] at [facility]. [place], [place], [address] Phone: [phone]   Fax:  [phone] at [facility] [address] [place], [place], [address] Phone: [phone]   Fax:  [phone]  Patient Details  Name: KHADIJA THIER MRN: [id number] Date of Birth: Mar 20, 1956 Referring Provider:  Myrene Galas, [facility] at [facility]. [place], [place], [address] Phone: [phone]   Fax:  [phone]  Patient Details  Name: EVELYN AGUINALDO MRN: [id number] Date of Birth: July 17, 1955 Referring Provider:  Myrene Galas, MD  Encounter Date: 12/26/2022   Suanne Marker, PTA 12/26/2022, 9:32 AM  [facility] at [facility] [address] [place], [place], [address] Phone: [phone]   Fax:  [phone] at [facility] [address] [place], [place], [address] Phone: [phone]   Fax:  [phone] Rehabilitation at [facility] [address] [place], [place], [address] Phone: [phone]   Fax:  [phone]  Patient Details  Name: LIDIYA REISE MRN: [id number] Date of Birth: [birth year] Referring Provider:  Myrene Galas, MD  Encounter Date: 12/26/2022   Suanne Marker, PTA 12/26/2022, 9:32 AM  [facility] Rehabilitation at [facility] [address] [place], [place], [address] Phone: [phone]   Fax:  [phone] at [facility] [address] [place], [place], [address] Phone: [phone]   Fax:  [phone]  Patient Details  Name: NASIRA JANUSZ MRN: [id number] Date of Birth: 17-Jan-1956 Referring Provider:  Myrene Galas, MD  Encounter Date: 12/26/2022   Suanne Marker, PTA 12/26/2022, 9:32 AM  [place]  Outpatient Rehabilitation at [facility] [address] [place], [place], [address] Phone: [phone]   Fax:  [phone]

## 2022-12-28 ENCOUNTER — Other Ambulatory Visit: Payer: Self-pay | Admitting: Orthopedic Surgery

## 2022-12-28 ENCOUNTER — Ambulatory Visit: Payer: Medicare Other | Admitting: Physical Therapy

## 2022-12-28 DIAGNOSIS — S42232K 3-part fracture of surgical neck of left humerus, subsequent encounter for fracture with nonunion: Secondary | ICD-10-CM

## 2022-12-29 ENCOUNTER — Other Ambulatory Visit: Payer: Self-pay | Admitting: Family Medicine

## 2022-12-29 ENCOUNTER — Other Ambulatory Visit: Payer: Self-pay | Admitting: Student

## 2023-01-01 DIAGNOSIS — E119 Type 2 diabetes mellitus without complications: Secondary | ICD-10-CM | POA: Diagnosis not present

## 2023-01-02 ENCOUNTER — Ambulatory Visit
Admission: RE | Admit: 2023-01-02 | Discharge: 2023-01-02 | Disposition: A | Discharge status: Home / Self Care | Payer: Medicare Other | Source: Ambulatory Visit | Attending: Orthopedic Surgery | Admitting: Orthopedic Surgery

## 2023-01-02 DIAGNOSIS — S42232K 3-part fracture of surgical neck of left humerus, subsequent encounter for fracture with nonunion: Secondary | ICD-10-CM

## 2023-01-02 DIAGNOSIS — S42212D Unspecified displaced fracture of surgical neck of left humerus, subsequent encounter for fracture with routine healing: Secondary | ICD-10-CM | POA: Diagnosis not present

## 2023-01-10 DIAGNOSIS — S42232K 3-part fracture of surgical neck of left humerus, subsequent encounter for fracture with nonunion: Secondary | ICD-10-CM | POA: Diagnosis not present

## 2023-01-11 ENCOUNTER — Other Ambulatory Visit: Payer: Self-pay | Admitting: Student

## 2023-01-12 ENCOUNTER — Ambulatory Visit: Payer: Medicare Other | Admitting: Podiatry

## 2023-01-12 DIAGNOSIS — E1149 Type 2 diabetes mellitus with other diabetic neurological complication: Secondary | ICD-10-CM

## 2023-01-12 DIAGNOSIS — M7752 Other enthesopathy of left foot: Secondary | ICD-10-CM

## 2023-01-12 DIAGNOSIS — M79674 Pain in right toe(s): Secondary | ICD-10-CM

## 2023-01-12 DIAGNOSIS — B351 Tinea unguium: Secondary | ICD-10-CM

## 2023-01-12 DIAGNOSIS — M79675 Pain in left toe(s): Secondary | ICD-10-CM | POA: Diagnosis not present

## 2023-01-12 DIAGNOSIS — G629 Polyneuropathy, unspecified: Secondary | ICD-10-CM

## 2023-01-12 MED ORDER — GABAPENTIN 800 MG PO TABS: 800.0000 mg | ORAL_TABLET | Freq: Two times a day (BID) | ORAL | 2 refills | Status: DC | PRN

## 2023-01-12 MED ORDER — NAPROXEN 500 MG PO TABS: 500.0000 mg | ORAL_TABLET | Freq: Two times a day (BID) | ORAL | 0 refills | Status: DC

## 2023-01-12 NOTE — Progress Notes (Signed)
Subjective: Chief Complaint  Patient presents with   Nail Problem    Diabetic Foot Care-nail trim    Toe Pain    C/o toe pain occasionally. Pain comes and then disappears. Patient is using oil that was prescribed by a provider and applies it to the cuticle and the pain goes away.      67 year old female presents the office today with above concerns.  She states that she cannot go to the nails herself, she does not feel back.  She is needing some intermittent pain to the left big toe.  No injuries that she reports.  Pain is daily but intermittent.  No injuries.  She is on gabapentin and she states her nerves have were really bad last night.   Objective: AAO x3, NAD DP/PT pulses palpable bilaterally, CRT less than 3 seconds Still describes burning, tingling mostly to her toes.  Sensation intact with Semmes Weinstein monofilament.  Still negative Tinel sign.   No areas of pinpoint tenderness.  Not able to elicit any significant pain particular the left hallux today but this where she gets discomfort along the hallux IPJ going to the first MPJ.  There is no pain with range of motion today.  There is no other areas of discomfort. Nails are hypertrophic, dystrophic, brittle, discolored, elongated 10. No surrounding redness or drainage. Tenderness nails 1-5 bilaterally. No open lesions or pre-ulcerative lesions are identified today. No pain with calf compression, swelling, warmth, erythema  Assessment: 67 year old female with capsulitis, neuropathy; symptomatic onychomycosis  Plan: -All treatment options discussed with the patient including all alternatives, risks, complications.  -Continue gabapentin.  We had refilled this for her today given her symptoms. -We discussed anti-inflammatories as needed.  Refilled naproxen that she was on this previously did well.  Discussed topical medications.  Continue to monitor she is avoiding excess pressure.  I did review her prior x-rays with her as  well. -Sharp debrided nails x 10 without any complications or bleeding  Return in about 3 months (around 04/14/2023) for toe pain, routine care.  Vivi Barrack DPM

## 2023-01-25 ENCOUNTER — Other Ambulatory Visit: Payer: Self-pay | Admitting: Student

## 2023-01-29 ENCOUNTER — Other Ambulatory Visit: Payer: Self-pay | Admitting: Student

## 2023-01-29 DIAGNOSIS — E11311 Type 2 diabetes mellitus with unspecified diabetic retinopathy with macular edema: Secondary | ICD-10-CM

## 2023-01-29 NOTE — Telephone Encounter (Signed)
Patient returns call to nurse line regarding Fiasp prescription.   Called pharmacy. They are requesting alternative. Insurance preferred alternative is Humalog. Please send in new prescription if appropriate.   Veronda Prude, RN

## 2023-01-30 MED ORDER — INSULIN LISPRO 100 UNIT/ML IJ SOLN: INTRAMUSCULAR | 11 refills | Status: DC

## 2023-01-30 MED ORDER — HUMALOG TEMPO PEN 100 UNIT/ML ~~LOC~~ SOPN: PEN_INJECTOR | SUBCUTANEOUS | 6 refills | Status: DC

## 2023-01-30 NOTE — Addendum Note (Signed)
Addended by: Pearlean Brownie L on: 01/30/2023 08:09 AM   Modules accepted: Orders

## 2023-02-01 DIAGNOSIS — E119 Type 2 diabetes mellitus without complications: Secondary | ICD-10-CM | POA: Diagnosis not present

## 2023-02-06 DIAGNOSIS — M25512 Pain in left shoulder: Secondary | ICD-10-CM | POA: Diagnosis not present

## 2023-02-06 DIAGNOSIS — S42202A Unspecified fracture of upper end of left humerus, initial encounter for closed fracture: Secondary | ICD-10-CM | POA: Diagnosis not present

## 2023-02-07 ENCOUNTER — Telehealth: Payer: Self-pay

## 2023-02-07 NOTE — Telephone Encounter (Signed)
   Pre-operative Risk Assessment    Patient Name: Alicia [name]  DOB: 1956-03-26 MRN: [id number]     Request for Surgical Clearance    Procedure:  Left reverse shoulder arthroplasty   Date of Surgery:  Clearance TBD                                 Surgeon:  Dr. Duwayne Heck  Surgeon's Group or Practice Name:  [facility] Phone number:  [phone] Fax number:  [phone]   Type of Clearance Requested:   - Pharmacy:  Hold Aspirin     Type of Anesthesia:  Choice    Additional requests/questions:    SignedVernard Gambles   02/07/2023, 2:52 PM

## 2023-02-08 NOTE — Telephone Encounter (Signed)
Left message through sign language interpreter for pt to call back to [phone] to schedule in office appt with DR. End or APP for pre op clearance.

## 2023-02-08 NOTE — Telephone Encounter (Signed)
   Name: Alicia [name]  DOB: 1956-02-17  MRN: [id number]  Primary Cardiologist: Yvonne Kendall, MD  Chart reviewed as part of pre-operative protocol coverage. Because of Alicia [name]'s past medical history and time since last visit, she will require a follow-up in-office visit in order to better assess preoperative cardiovascular risk.She is deaf and needs interpretor with her to translate.  Last seen 04/12/23/2023   Pre-op covering staff: - Please schedule appointment and call patient to inform them. If patient already had an upcoming appointment within acceptable timeframe, please add "pre-op clearance" to the appointment notes so provider is aware. - Please contact requesting surgeon's office via preferred method (i.e, phone, fax) to inform them of need for appointment prior to surgery.  Per office protocol, if patient is without any new symptoms or concerns at the time of their virtual visit, he/she may hold aSA for 7 days prior to procedure. Please resume ASA as soon as possible postprocedure, at the discretion of the surgeon.     Joni Reining, NP  02/08/2023, 8:44 AM

## 2023-02-13 NOTE — Telephone Encounter (Signed)
Pt has appt with Jari Favre, [facility] 03/01/23. I have added need pre op clearance to appt notes as well. I will update all parties involved.

## 2023-02-14 ENCOUNTER — Telehealth: Payer: Self-pay | Admitting: Family Medicine

## 2023-02-14 NOTE — Telephone Encounter (Signed)
Received form for surgical clearance.  Patient has diabetes, needs A1C check etc Front team Please call to schedule visit with any resident in coming 1-2 weeks  Form is in bottom part of Dr. Charlyne Mom box.  Terisa Starr, MD  Family Medicine Teaching Service

## 2023-02-16 ENCOUNTER — Encounter: Payer: Self-pay | Admitting: Student

## 2023-02-16 ENCOUNTER — Ambulatory Visit (INDEPENDENT_AMBULATORY_CARE_PROVIDER_SITE_OTHER): Payer: Medicare Other | Admitting: Student

## 2023-02-16 VITALS — BP 134/72 | HR 80 | Ht 64.0 in | Wt 150.4 lb

## 2023-02-16 DIAGNOSIS — E1169 Type 2 diabetes mellitus with other specified complication: Secondary | ICD-10-CM

## 2023-02-16 DIAGNOSIS — Z794 Long term (current) use of insulin: Secondary | ICD-10-CM

## 2023-02-16 DIAGNOSIS — E11311 Type 2 diabetes mellitus with unspecified diabetic retinopathy with macular edema: Secondary | ICD-10-CM

## 2023-02-16 LAB — POCT GLYCOSYLATED HEMOGLOBIN (HGB A1C): HbA1c, POC (controlled diabetic range): 8.3 % — AB (ref 0.0–7.0)

## 2023-02-16 MED ORDER — SEMAGLUTIDE (1 MG/DOSE) 4 MG/3ML ~~LOC~~ SOPN: 2.0000 mg | PEN_INJECTOR | SUBCUTANEOUS | 1 refills | Status: DC

## 2023-02-16 MED ORDER — PANTOPRAZOLE SODIUM 40 MG PO TBEC: 40.0000 mg | DELAYED_RELEASE_TABLET | Freq: Every morning | ORAL | 0 refills | Status: DC

## 2023-02-16 MED ORDER — BASAGLAR KWIKPEN 100 UNIT/ML ~~LOC~~ SOPN: 14.0000 [IU] | PEN_INJECTOR | SUBCUTANEOUS | 11 refills | Status: DC

## 2023-02-16 NOTE — Patient Instructions (Addendum)
It was great to see you today! Thank you for choosing [facility] for your primary care.  Today we addressed: Increase Ozempic to 2 mg  Increase Basaglar to 14 U Humalog 2-4 units with meals  Come back in 6 weeks for a recheck  Bring in your pill bottles   If you haven't already, sign up for My Chart to have easy access to your labs results, and communication with your primary care physician. I recommend that you always bring your medications to each appointment as this makes it easy to ensure you are on the correct medications and helps [name] not miss refills when you need them. Call the clinic at [phone] if your symptoms worsen or you have any concerns.  Return in about 6 weeks (around 03/30/2023) for Pre-op. Please arrive 15 minutes before your appointment to ensure smooth check in process.  We appreciate your efforts in making this happen.  Thank you for allowing me to participate in your care, Alfredo Martinez, MD 02/16/2023, 11:42 AM PGY-3, [facility]

## 2023-02-16 NOTE — Assessment & Plan Note (Signed)
Increase to Ozempic 2 mg, Basaglar 14 units, keep Humalog at 2 to 4 units with meals. I would like to have this patient's A1c improved before continuing with the reverse shoulder, discussed with the patient. She will return in 4 to 6 weeks for repeat A1c although at 4 weeks she may not see benefit from her increase in medications today. At next visit with PCP, please bring all pill bottles for [name] to assess.

## 2023-02-16 NOTE — Progress Notes (Cosign Needed Addendum)
  Patient Name: Alicia [name] Date of Birth: [birth year] Date of Visit: 02/19/23 PCP: Bess Kinds, MD  Sign Language interpreter in room   Chief Complaint: preoperative evaluation Surgeon:  Dr. Junita Push  Surgery: Reverse Shoulder Date of Procedure: TBD  Subjective: Alicia [name] is a pleasant 67 y.o. with medical history significant for DM2, deafness, HTN, hyperthyroidism, HLD, IDA, IBS, PSVT, OSA presenting today for preop clearance.   Type 2 Diabetes: Home medications include: Ozempic 1 mg, Basaglar 12 U daily, Humalog 2-4 U meals. Does endorse compliance.   Most recent A1Cs:  Lab Results  Component Value Date   HGBA1C 8.3 (A) 02/16/2023   HGBA1C 7.0 11/13/2022   Last Microalbumin, LDL, Creatinine: Lab Results  Component Value Date   LDLCALC 92 03/07/2022   CREATININE 0.75 09/20/2022    The patient can walk up 2 flights of stairs without difficulty.    Prior surgeries: 12 separate surgeries   ACDF -- Last year, went well   Review of systems: Chest pain with exertion? No Dyspnea on exertion? No Can you walk 2 blocks without dyspnea or chest pain? No Can you walk up a flight of stairs without chest pain or dyspnea? No Any history of easy bruising or bleeding? No Any family history of bleeding diathesis? No Any personal or family of difficulty with anesthesia? No Any history of sleep apnea? No  Medical History: Cardiac conditions? pSVT History of VTE? No History of adverse surgical outcome? No  Diabetes? Yes  Obesity? BMI 25 OSA? Yes    I have reviewed the patient's medical, surgical, family, and social history as appropriate.   Vitals:   02/16/23 1104  BP: 134/72  Pulse: 80  SpO2: 100%   Filed Weights   02/16/23 1104  Weight: 150 lb 6 oz (68.2 kg)   General: NAD, pleasant, able to participate in exam Card: RRR, no murmur/gallop/rub Respiratory: No respiratory distress Skin: warm and dry, no rashes noted Psych: Normal affect and  mood  Assessment & Plan Type 2 diabetes mellitus with other specified complication, with long-term current use of insulin (HCC) Increase to Ozempic 2 mg, Basaglar 14 units, keep Humalog at 2 to 4 units with meals. I would like to have this patient's A1c improved before continuing with the reverse shoulder, discussed with the patient. She will return in 4 to 6 weeks for repeat A1c although at 4 weeks she may not see benefit from her increase in medications today. At next visit with PCP, please bring all pill bottles for [name] to assess.  Alfredo Martinez, MD  [facility] Medicine Teaching Service

## 2023-02-16 NOTE — Progress Notes (Deleted)
  Patient Name: Alicia [name] Date of Birth: 1956/04/04 Date of Visit: 02/16/23 PCP: Bess Kinds, MD  Chief Complaint: preoperative evaluation Surgeon:  *** Surgery: *** Date of Procedure: ***  Subjective: Alicia [name] is a pleasant 67 y.o. with medical history significant for *** presenting today for ***.    The patient can walk ***city blocks and or up 2 flights of stairs without difficulty. ***   Prior surgeries: ***  Review of systems: Chest pain with exertion? {yes/no:20286} Dyspnea on exertion? {yes/no:20286} Can you walk 2 blocks without dyspnea or chest pain? {yes/no:20286} Can you walk up a flight of stairs without chest pain or dyspnea? {yes/no:20286} Any history of easy bruising or bleeding? {yes/no:20286} Any family history of bleeding diathesis? {yes/no:20286} Any personal or family of difficulty with anesthesia? {yes/no:20286} Any history of sleep apnea? {yes/no:20286}  Medical History: Cardiac conditions? *** History of VTE? *** History of adverse surgical outcome? ***  Diabetes? *** Obesity? *** OSA? ***   I have reviewed the patient's medical, surgical, family, and social history as appropriate.   There were no vitals filed for this visit. There were no vitals filed for this visit. ***  There are no diagnoses linked to this encounter.  Patient is ***risk for a ***risk surgery.  The patient has been medically optimized for this procedure. In addition,  I make the following recommendations for further evaluation and medication adjustments prior to their  planned procedure: ***    Terisa Starr, MD  [facility] Medicine Teaching Service

## 2023-02-19 NOTE — Assessment & Plan Note (Addendum)
Increase to Ozempic 2 mg, Basaglar 14 units, keep Humalog at 2 to 4 units with meals. I would like to have this patient's A1c improved before continuing with the reverse shoulder, discussed with the patient. She will return in 4 to 6 weeks for repeat A1c although at 4 weeks she may not see benefit from her increase in medications today. At next visit with PCP, please bring all pill bottles for [name] to assess.

## 2023-02-19 NOTE — Assessment & Plan Note (Signed)
>>  ASSESSMENT AND PLAN FOR DIABETES (HCC) WRITTEN ON 02/19/2023 10:52 AM BY [name], [name], MD  Increase to Ozempic  2 mg, Basaglar  14 units, keep Humalog  at 2 to 4 units with meals. I would like to have this patient's A1c improved before continuing with the reverse shoulder, discussed with the patient. She will return in 4 to 6 weeks for repeat A1c although at 4 weeks she may not see benefit from her increase in medications today. At next visit with PCP, please bring all pill bottles for us  to assess.

## 2023-02-22 ENCOUNTER — Telehealth: Payer: Self-pay

## 2023-02-22 ENCOUNTER — Ambulatory Visit: Payer: Medicare Other | Attending: Physician Assistant

## 2023-02-22 DIAGNOSIS — E785 Hyperlipidemia, unspecified: Secondary | ICD-10-CM

## 2023-02-22 NOTE — Telephone Encounter (Signed)
Pharmacy Patient Advocate Encounter   Received notification from CoverMyMeds that prior authorization for [facility] is required/requested.   Insurance verification completed.   The patient is insured through [facility] .   Per test claim: PA required; PA submitted to [employer] via CoverMyMeds Key/confirmation #/EOC [id number]. Status is pending

## 2023-02-23 LAB — LIPID PANEL
Chol/HDL Ratio: 2.1 ratio (ref 0.0–4.4)
Cholesterol, Total: 198 mg/dL (ref 100–199)
HDL: 94 mg/dL (ref >39)
LDL Chol Calc (NIH): 90 mg/dL (ref 0–99)
Triglycerides: 78 mg/dL (ref 0–149)
VLDL Cholesterol Cal: 14 mg/dL (ref 5–40)

## 2023-02-26 ENCOUNTER — Other Ambulatory Visit: Payer: Self-pay | Admitting: Student

## 2023-02-26 DIAGNOSIS — E119 Type 2 diabetes mellitus without complications: Secondary | ICD-10-CM

## 2023-02-26 NOTE — Telephone Encounter (Signed)
Pharmacy Patient Advocate Encounter  Received notification from [facility] that Prior Authorization for [facility] has been APPROVED from 02/22/23 to 02/22/24

## 2023-02-27 ENCOUNTER — Other Ambulatory Visit: Payer: Self-pay | Admitting: Student

## 2023-02-27 DIAGNOSIS — E1169 Type 2 diabetes mellitus with other specified complication: Secondary | ICD-10-CM

## 2023-02-28 ENCOUNTER — Other Ambulatory Visit: Payer: Self-pay | Admitting: Student

## 2023-02-28 MED ORDER — OZEMPIC (2 MG/DOSE) 8 MG/3ML ~~LOC~~ SOPN: 2.0000 mg | PEN_INJECTOR | SUBCUTANEOUS | 3 refills | Status: AC

## 2023-02-28 NOTE — Progress Notes (Signed)
Pharmacy requesting new Rx for 2 mg injection, given increase in dose from 1 mg. Canceled older 1mg  order

## 2023-02-28 NOTE — Progress Notes (Unsigned)
Office Visit    Patient Name: Alicia [name] Date of Encounter: 03/01/2023  PCP:  Bess Kinds, MD   [facility]  Cardiologist:  Yvonne Kendall, MD  Advanced Practice Provider:  No care team member to display Electrophysiologist:  None   HPI    Alicia [name] is a 67 y.o. female with a past medical history significant for hypertension, hyperlipidemia, SVT, DM, deafness, OSA (denied as an ongoing diagnosis, never been told she needed CPAP) presents today for overdue follow-up appointment.  Historically her chest pain is felt to be atypical with negative stress testing.  She was seen by Dr. Elberta Fortis November 2020 for palpitations and SVT noted on the monitor.  At that time she was still waiting to receive CPAP for sleep apnea.  She was having palpitations despite being on Coreg.  Discussed ablation though the patient preferred ongoing medical management first and daily diltiazem was added.  She was in the ER November 2020 with chest pain, nausea, felt to be GI ultimately and her Prilosec was increased.  Last seen 03/24/2022.  She was more active but had lost some weight.  She retired which allowed her to have more time to exercise.  No chest pain or shortness of breath.  Infrequently will have fluttering feeling in her heart beat.  No use of her prn dilt.  Interview performed with onsite sign language interpreter.  She was seen by me April 25, 2022, she states that she is having some tightness on the right side.  It happened yesterday when she was folding close.  She sat down and it persisted for about 2 to 3 minutes.  She states that it happens about twice a year and is not associated with the fluttering she was feeling in her chest before.  She has experienced some fluttering in her chest but she takes her as needed diltiazem and this works well for her.  We discussed if the right-sided chest tightness occurs more frequently or last for longer to please let us  know.  No EKG changes present today that would be concerning for ischemia.  Her exercise has slowed down due to neck surgery but she is just starting to get back to her normal activity level.  Today, she is here for preop clearance. She has low sugar and tried to pull on the fridge and she fell in her shoulder. Luckily she lives with someone. Feels normal and beating normal. Has been taking her medications, sometimes needs the cardizem medication for palpitations. One time with chest pain not often. Breathing is okay.  She cannot walk long distances but also needs back surgery.  She does do some dancing and tries to keep up with her 10 grandkids.  For this reason she has surpassed 4 METS on the DASI.  Hold aspirin 7 days and restart when medically safe to do so. She would like to establish care with a provider here in [place] and she picked Dr. Anne Fu.   Reports no shortness of breath nor dyspnea on exertion. No edema, orthopnea, PND. Reports no palpitations.   Past Medical History    Past Medical History:  Diagnosis Date   Anemia    Arthritis    back and knees   ARTHRITIS, KNEE 09/17/2007   Asthma 04/04/2010   pt states she does not have asthma   Cataract 01/07/2019   Closed fracture of distal end of left radius 11/01/2015   Complete deafness    Meningitis at  age 79   Deaf    Diabetes mellitus    Diabetic neuropathy (HCC)    Ganglion cyst 06/22/2011   Gastroparesis    GERD (gastroesophageal reflux disease)    H/O: C-section    Hyperlipidemia    Hypertension    Neuromuscular disorder (HCC)    diabetic neuropathy   PSVT (paroxysmal supraventricular tachycardia) 11/09/2017   Event monitor 09/14/2017 - Predominantly sinus rhythm with episodes of narrow-complex tachycardia suggestive of paroxysmal supraventricular tachycardia.   S/P appy    Past Surgical History:  Procedure Laterality Date   APPENDECTOMY     cardiolyte EF 77%, no ischemia in 2006  2006   CARPAL TUNNEL RELEASE  Right 04/20/2015   Procedure: RIGHT CARPAL TUNNEL RELEASE;  Surgeon: Cindee Salt, MD;  Location: [facility];  Service: Orthopedics;  Laterality: Right;   CARPAL TUNNEL RELEASE Left 11/04/2015   Procedure: CARPAL TUNNEL RELEASE;  Surgeon: Cindee Salt, MD;  Location: [facility];  Service: Orthopedics;  Laterality: Left;   CESAREAN SECTION     OPEN REDUCTION INTERNAL FIXATION (ORIF) DISTAL RADIAL FRACTURE Left 11/04/2015   Procedure: OPEN REDUCTION INTERNAL FIXATION (ORIF) LEFT DISTAL RADIAL FRACTURE POSSIBLE BONE GRAFT;  Surgeon: Cindee Salt, MD;  Location: [facility];  Service: Orthopedics;  Laterality: Left;   POSTERIOR CERVICAL FUSION/FORAMINOTOMY N/A 12/26/2021   Procedure: Posterior cervical fusion with lateral mass fixation - Cervical Three-Cervical Six, Cervical Laminectomy Cervical Three-Cervica; Five;  Surgeon: Tia Alert, MD;  Location: [facility];  Service: Neurosurgery;  Laterality: N/A;   TRIGGER FINGER RELEASE Right 04/20/2015   Procedure: RELEASE TRIGGER FINGER/A-1 PULLEY RIGHT MIDDLE FINGER,RIGHT RING FINGER;  Surgeon: Cindee Salt, MD;  Location: [facility];  Service: Orthopedics;  Laterality: Right;   TUBAL LIGATION     ULNAR NERVE TRANSPOSITION Right 04/20/2015   Procedure: RIGHT ULNAR NERVE DECOMPRESSION;  Surgeon: Cindee Salt, MD;  Location: [facility];  Service: Orthopedics;  Laterality: Right;   UTERINE FIBROID EMBOLIZATION  2007   WRIST SURGERY     Cyst removed on left   WRIST SURGERY Right 1985   tendon repair R wrist    Allergies  Allergies  Allergen Reactions   Sulfonamide Derivatives Swelling and Rash    REACTION: rash, swelling - "Lost BABY" - Terrible itching.    Lipitor [Atorvastatin Calcium] Other (See Comments)    Muscle Aches - Mild-Moderate - completely resolved with D/C of atorva.    Ramipril Other (See Comments)    REACTION: cough    Trulicity [Dulaglutide] Nausea And Vomiting     EKGs/Labs/Other Studies Reviewed:   The following studies were reviewed today:  Carotid ultrasound 12/22/2019  Summary:  Right Carotid: The extracranial vessels were near-normal with only minimal  wall                 thickening or plaque.   Left Carotid: The extracranial vessels were near-normal with only minimal  wall                thickening or plaque.   Vertebrals:  Bilateral vertebral arteries demonstrate antegrade flow.  Subclavians: Normal flow hemodynamics were seen in bilateral subclavian               arteries.  EKG:  EKG is  ordered today.  The ekg ordered today demonstrates normal sinus rhythm, rate 69 bpm  Recent Labs: 09/20/2022: ALT 13; BUN 15; Creatinine, Ser 0.75; Hemoglobin 12.9; Magnesium 1.7; Platelets 298; Potassium 3.9;  Sodium 135  Recent Lipid Panel    Component Value Date/Time   CHOL 198 02/22/2023 0826   TRIG 78 02/22/2023 0826   HDL 94 02/22/2023 0826   CHOLHDL 2.1 02/22/2023 0826   CHOLHDL 2.3 07/28/2016 0936   VLDL 12 07/28/2016 0936   LDLCALC 90 02/22/2023 0826   LDLDIRECT 78 07/10/2013 1441    Home Medications   Current Meds  Medication Sig   ACCU-CHEK SOFTCLIX LANCETS lancets TEST 4 TIMES A DAY   acetaminophen (TYLENOL) 650 MG CR tablet Take 2 tablets (1,300 mg total) by mouth every 8 (eight) hours as needed for pain.   aspirin 81 MG chewable tablet Chew 81 mg by mouth daily.   carvedilol (COREG) 25 MG tablet TAKE 1 TABLET BY MOUTH TWICE A DAY.   cholecalciferol (VITAMIN D) 1000 UNITS tablet Take 1,000 Units by mouth daily.   ciclopirox (PENLAC) 8 % solution Apply topically at bedtime. Apply over nail and surrounding skin. Apply daily over previous coat. After seven (7) days, may remove with alcohol and continue cycle.   Cyanocobalamin (VITAMIN B 12) 500 MCG TABS Take 500 mcg by mouth daily at 6 (six) AM.   diclofenac Sodium (VOLTAREN) 1 % GEL Apply 2 g topically 4 (four) times daily. Rub into affected area of foot 2 to 4 times daily  (Patient taking differently: Apply 2 g topically 4 (four) times daily as needed (pain). Rub into affected area of foot 2 to 4 times daily)   diltiazem (CARDIZEM CD) 240 MG 24 hr capsule TAKE 1 CAPSULE BY MOUTH EVERY DAY   diltiazem (CARDIZEM) 30 MG tablet TAKE 1 TABLET BY MOUTH AS NEEDED (HEART RACING OR PALPITATIONS THAT LAST LONGER THAN 15 MINS).   DULoxetine (CYMBALTA) 60 MG capsule TAKE 1 CAPSULE BY MOUTH EVERY DAY   Efinaconazole 10 % SOLN Apply 1 drop topically daily.   famotidine (PEPCID) 20 MG tablet TAKE 1 TABLET BY MOUTH TWICE A DAY   ferrous sulfate 325 (65 FE) MG tablet Take 325 mg by mouth daily with breakfast.   fluticasone (FLONASE) 50 MCG/ACT nasal spray 2 puffs each nostril daily   gabapentin (NEURONTIN) 800 MG tablet Take 1 tablet (800 mg total) by mouth 2 (two) times daily as needed.   glucose blood (ACCU-CHEK AVIVA PLUS) test strip USE TO CHECK BLOOD SUGAR 3 TIMES PER DAY   glucose blood test strip Use as instructed   hydrochlorothiazide (MICROZIDE) 12.5 MG capsule TAKE 1 CAPSULE BY MOUTH EVERY DAY   hydrocortisone (ANUSOL-HC) 2.5 % rectal cream Place 1 application rectally 2 (two) times daily.   Insulin Glargine (BASAGLAR KWIKPEN) 100 UNIT/ML Inject 14 Units into the skin every morning.   Insulin Lispro w/ Trans Port (HUMALOG TEMPO PEN) 100 UNIT/ML SOPN INJECT 2-4 UNITS INTO THE SKIN 3 (THREE) TIMES DAILY WITH MEALS   Insulin Pen Needle 31G X 5 MM MISC 1 Container by Does not apply route once as needed for up to 1 dose.   LINZESS 72 MCG capsule Take 72 mcg by mouth every morning.   meloxicam (MOBIC) 15 MG tablet TAKE 1 TABLET BY MOUTH EVERY DAY AS NEEDED FOR PAIN   metFORMIN (GLUCOPHAGE) 1000 MG tablet TAKE 1 TABLET BY MOUTH TWICE A DAY   naproxen (NAPROSYN) 500 MG tablet Take 1 tablet (500 mg total) by mouth 2 (two) times daily with a meal.   Olopatadine HCl 0.2 % SOLN PLACE 1 DROP IN BOTH EYES DAILY   oxyCODONE-acetaminophen (PERCOCET/ROXICET) 5-325 MG tablet Take 1  tablet  by mouth every 6 (six) hours as needed for severe pain.   pantoprazole (PROTONIX) 40 MG tablet Take 1 tablet (40 mg total) by mouth every morning.   rosuvastatin (CRESTOR) 5 MG tablet TAKE 1 TABLET (5 MG TOTAL) BY MOUTH 2 TIMES A WEEK   Semaglutide, 2 MG/DOSE, (OZEMPIC, 2 MG/DOSE,) 8 MG/3ML SOPN Inject 2 mg into the skin once a week.   valsartan (DIOVAN) 160 MG tablet TAKE 1 TABLET BY MOUTH EVERYDAY AT BEDTIME     Review of Systems      All other systems reviewed and are otherwise negative except as noted above.  Physical Exam    VS:  BP 134/70   Pulse 74   Ht 5\' 4"  (1.626 m)   Wt 151 lb 9.6 oz (68.8 kg)   SpO2 97%   BMI 26.02 kg/m  , BMI Body mass index is 26.02 kg/m.  Wt Readings from Last 3 Encounters:  03/01/23 151 lb 9.6 oz (68.8 kg)  02/16/23 150 lb 6 oz (68.2 kg)  11/13/22 148 lb (67.1 kg)     GEN: Well nourished, well developed, in no acute distress. HEENT: normal. Neck: Supple, no JVD, carotid bruits, or masses. Cardiac: RRR, no murmurs, rubs, or gallops. No clubbing, cyanosis, edema.  Radials/PT 2+ and equal bilaterally.  Respiratory:  Respirations regular and unlabored, clear to auscultation bilaterally. GI: Soft, nontender, nondistended. MS: No deformity or atrophy. Skin: Warm and dry, no rash. Neuro:  Strength and sensation are intact. Psych: Normal affect.  Assessment & Plan    Preop clearance  Ms. Guier's perioperative risk of a major cardiac event is 0.9% according to the Revised Cardiac Risk Index (RCRI).  Therefore, she is at low risk for perioperative complications.   Her functional capacity is good at 5.47 METs according to the [place] Activity Status Index (DASI). Recommendations: According to ACC/AHA guidelines, no further cardiovascular testing needed.  The patient may proceed to surgery at acceptable risk.   Antiplatelet and/or Anticoagulation Recommendations: Aspirin can be held for 7 days prior to her surgery.  Please resume Aspirin post  operatively when it is felt to be safe from a bleeding standpoint.    Right-sided Chest tightness -Occurs infrequently and only last 2 to 3 minutes -If this starts to occur more often or last for longer please let us know -Low suspicion for ischemia and would defer ischemic work-up for now  SVT -When she has symptoms she takes an extra dose of her diltiazem and this works well -Continue current medication regimen  HTN -well controlled today -[place] blood pressure cuff recommended for at home monitoring  HLD -lipid panel when she next comes in for appointment -Continue current medications which include Crestor 5 mg twice a week.          Disposition: Follow up 6 months to establish care with Dr. Anne Fu   Signed, Sharlene Dory, PA-C 03/01/2023, 1:11 PM [facility]

## 2023-03-01 ENCOUNTER — Ambulatory Visit: Payer: Medicare Other | Attending: Physician Assistant | Admitting: Physician Assistant

## 2023-03-01 ENCOUNTER — Encounter: Payer: Self-pay | Admitting: Physician Assistant

## 2023-03-01 VITALS — BP 134/70 | HR 74 | Ht 64.0 in | Wt 151.6 lb

## 2023-03-01 DIAGNOSIS — I471 Supraventricular tachycardia, unspecified: Secondary | ICD-10-CM

## 2023-03-01 DIAGNOSIS — I10 Essential (primary) hypertension: Secondary | ICD-10-CM | POA: Diagnosis not present

## 2023-03-01 DIAGNOSIS — E785 Hyperlipidemia, unspecified: Secondary | ICD-10-CM

## 2023-03-01 DIAGNOSIS — Z01810 Encounter for preprocedural cardiovascular examination: Secondary | ICD-10-CM

## 2023-03-01 DIAGNOSIS — R0789 Other chest pain: Secondary | ICD-10-CM

## 2023-03-01 NOTE — Patient Instructions (Addendum)
Medication Instructions:  Your physician recommends that you continue on your current medications as directed. Please refer to the Current Medication list given to you today.  *If you need a refill on your cardiac medications before your next appointment, please call your pharmacy*  Lab Work: None ordered If you have labs (blood work) drawn today and your tests are completely normal, you will receive your results only by: MyChart Message (if you have MyChart) OR A paper copy in the mail If you have any lab test that is abnormal or we need to change your treatment, we will call you to review the results.  Follow-Up: At [facility], you and your health needs are our priority.  As part of our continuing mission to provide you with exceptional heart care, we have created designated Provider Care Teams.  These Care Teams include your primary Cardiologist (physician) and Advanced Practice Providers (APPs -  Physician Assistants and Nurse Practitioners) who all work together to provide you with the care you need, when you need it.  Your next appointment:   03/28/23 at 8:15 AM  Provider:   Dr Elberta Fortis  6 months with Dr Anne Fu  Low-Sodium Eating Plan Salt (sodium) helps you keep a healthy balance of fluids in your body. Too much sodium can raise your blood pressure. It can also cause fluid and waste to be held in your body. Your health care provider or dietitian may recommend a low-sodium eating plan if you have high blood pressure (hypertension), kidney disease, liver disease, or heart failure. Eating less sodium can help lower your blood pressure and reduce swelling. It can also protect your heart, liver, and kidneys. What are tips for following this plan? Reading food labels  Check food labels for the amount of sodium per serving. If you eat more than one serving, you must multiply the listed amount by the number of servings. Choose foods with less than 140 milligrams (mg) of sodium  per serving. Avoid foods with 300 mg of sodium or more per serving. Always check how much sodium is in a product, even if the label says "unsalted" or "no salt added." Shopping  Buy products labeled as "low-sodium" or "no salt added." Buy fresh foods. Avoid canned foods and pre-made or frozen meals. Avoid canned, cured, or processed meats. Buy breads that have less than 80 mg of sodium per slice. Cooking  Eat more home-cooked food. Try to eat less restaurant, buffet, and fast food. Try not to add salt when you cook. Use salt-free seasonings or herbs instead of table salt or sea salt. Check with your provider or pharmacist before using salt substitutes. Cook with plant-based oils, such as canola, sunflower, or olive oil. Meal planning When eating at a restaurant, ask if your food can be made with less salt or no salt. Avoid dishes labeled as brined, pickled, cured, or smoked. Avoid dishes made with soy sauce, miso, or teriyaki sauce. Avoid foods that have monosodium glutamate (MSG) in them. MSG may be added to some restaurant food, sauces, soups, bouillon, and canned foods. Make meals that can be grilled, baked, poached, roasted, or steamed. These are often made with less sodium. General information Try to limit your sodium intake to 1,500-2,300 mg each day, or the amount told by your provider. What foods should I eat? Fruits Fresh, frozen, or canned fruit. Fruit juice. Vegetables Fresh or frozen vegetables. "No salt added" canned vegetables. "No salt added" tomato sauce and paste. Low-sodium or reduced-sodium tomato and vegetable  juice. Grains Low-sodium cereals, such as oats, puffed wheat and rice, and shredded wheat. Low-sodium crackers. Unsalted rice. Unsalted pasta. Low-sodium bread. Whole grain breads and whole grain pasta. Meats and other proteins Fresh or frozen meat, poultry, seafood, and fish. These should have no added salt. Low-sodium canned tuna and salmon. Unsalted nuts.  Dried peas, beans, and lentils without added salt. Unsalted canned beans. Eggs. Unsalted nut butters. Dairy Milk. Soy milk. Cheese that is naturally low in sodium, such as ricotta cheese, fresh mozzarella, or Swiss cheese. Low-sodium or reduced-sodium cheese. Cream cheese. Yogurt. Seasonings and condiments Fresh and dried herbs and spices. Salt-free seasonings. Low-sodium mustard and ketchup. Sodium-free salad dressing. Sodium-free light mayonnaise. Fresh or refrigerated horseradish. Lemon juice. Vinegar. Other foods Homemade, reduced-sodium, or low-sodium soups. Unsalted popcorn and pretzels. Low-salt or salt-free chips. The items listed above may not be all the foods and drinks you can have. Talk to a dietitian to learn more. What foods should I avoid? Vegetables Sauerkraut, pickled vegetables, and relishes. Olives. [place] fries. Onion rings. Regular canned vegetables, except low-sodium or reduced-sodium items. Regular canned tomato sauce and paste. Regular tomato and vegetable juice. Frozen vegetables in sauces. Grains Instant hot cereals. Bread stuffing, pancake, and biscuit mixes. Croutons. Seasoned rice or pasta mixes. Noodle soup cups. Boxed or frozen macaroni and cheese. Regular salted crackers. Self-rising flour. Meats and other proteins Meat or fish that is salted, canned, smoked, spiced, or pickled. Precooked or cured meat, such as sausages or meat loaves. Tomasa Blase. Ham. Pepperoni. Hot dogs. Corned beef. Chipped beef. Salt pork. Jerky. Pickled herring, anchovies, and sardines. Regular canned tuna. Salted nuts. Dairy Processed cheese and cheese spreads. Hard cheeses. Cheese curds. Blue cheese. Feta cheese. String cheese. Regular cottage cheese. Buttermilk. Canned milk. Fats and oils Salted butter. Regular margarine. Ghee. Bacon fat. Seasonings and condiments Onion salt, garlic salt, seasoned salt, table salt, and sea salt. Canned and packaged gravies. Worcestershire sauce. Tartar sauce.  Barbecue sauce. Teriyaki sauce. Soy sauce, including reduced-sodium soy sauce. Steak sauce. Fish sauce. Oyster sauce. Cocktail sauce. Horseradish that you find on the shelf. Regular ketchup and mustard. Meat flavorings and tenderizers. Bouillon cubes. Hot sauce. Pre-made or packaged marinades. Pre-made or packaged taco seasonings. Relishes. Regular salad dressings. Salsa. Other foods Salted popcorn and pretzels. Corn chips and puffs. Potato and tortilla chips. Canned or dried soups. Pizza. Frozen entrees and pot pies. The items listed above may not be all the foods and drinks you should avoid. Talk to a dietitian to learn more. This information is not intended to replace advice given to you by your health care provider. Make sure you discuss any questions you have with your health care provider. Document Revised: 07/06/2022 Document Reviewed: 07/06/2022 Elsevier Patient Education  2024 [employer] Heart-Healthy Eating Plan Many factors influence your heart health, including eating and exercise habits. Heart health is also called coronary health. Coronary risk increases with abnormal blood fat (lipid) levels. A heart-healthy eating plan includes limiting unhealthy fats, increasing healthy fats, limiting salt (sodium) intake, and making other diet and lifestyle changes. What is my plan? Your health care provider may recommend that: You limit your fat intake to _________% or less of your total calories each day. You limit your saturated fat intake to _________% or less of your total calories each day. You limit the amount of cholesterol in your diet to less than _________ mg per day. You limit the amount of sodium in your diet to less than _________ mg per day. What are  tips for following this plan? Cooking Cook foods using methods other than frying. Baking, boiling, grilling, and broiling are all good options. Other ways to reduce fat include: Removing the skin from poultry. Removing all visible  fats from meats. Steaming vegetables in water or broth. Meal planning  At meals, imagine dividing your plate into fourths: Fill one-half of your plate with vegetables and green salads. Fill one-fourth of your plate with whole grains. Fill one-fourth of your plate with lean protein foods. Eat 2-4 cups of vegetables per day. One cup of vegetables equals 1 cup (91 g) broccoli or cauliflower florets, 2 medium carrots, 1 large bell pepper, 1 large sweet potato, 1 large tomato, 1 medium white potato, 2 cups (150 g) raw leafy greens. Eat 1-2 cups of fruit per day. One cup of fruit equals 1 small apple, 1 large banana, 1 cup (237 g) mixed fruit, 1 large orange,  cup (82 g) dried fruit, 1 cup (240 mL) 100% fruit juice. Eat more foods that contain soluble fiber. Examples include apples, broccoli, carrots, beans, peas, and barley. Aim to get 25-30 g of fiber per day. Increase your consumption of legumes, nuts, and seeds to 4-5 servings per week. One serving of dried beans or legumes equals  cup (90 g) cooked, 1 serving of nuts is  oz (12 almonds, 24 pistachios, or 7 walnut halves), and 1 serving of seeds equals  oz (8 g). Fats Choose healthy fats more often. Choose monounsaturated and polyunsaturated fats, such as olive and canola oils, avocado oil, flaxseeds, walnuts, almonds, and seeds. Eat more omega-3 fats. Choose salmon, mackerel, sardines, tuna, flaxseed oil, and ground flaxseeds. Aim to eat fish at least 2 times each week. Check food labels carefully to identify foods with trans fats or high amounts of saturated fat. Limit saturated fats. These are found in animal products, such as meats, butter, and cream. Plant sources of saturated fats include palm oil, palm kernel oil, and coconut oil. Avoid foods with partially hydrogenated oils in them. These contain trans fats. Examples are stick margarine, some tub margarines, cookies, crackers, and other baked goods. Avoid fried foods. General  information Eat more home-cooked food and less restaurant, buffet, and fast food. Limit or avoid alcohol. Limit foods that are high in added sugar and simple starches such as foods made using white refined flour (white breads, pastries, sweets). Lose weight if you are overweight. Losing just 5-10% of your body weight can help your overall health and prevent diseases such as diabetes and heart disease. Monitor your sodium intake, especially if you have high blood pressure. Talk with your health care provider about your sodium intake. Try to incorporate more vegetarian meals weekly. What foods should I eat? Fruits All fresh, canned (in natural juice), or frozen fruits. Vegetables Fresh or frozen vegetables (raw, steamed, roasted, or grilled). Green salads. Grains Most grains. Choose whole wheat and whole grains most of the time. Rice and pasta, including brown rice and pastas made with whole wheat. Meats and other proteins Lean, well-trimmed beef, veal, pork, and lamb. Chicken and [place] without skin. All fish and shellfish. Wild duck, rabbit, pheasant, and venison. Egg whites or low-cholesterol egg substitutes. Dried beans, peas, lentils, and tofu. Seeds and most nuts. Dairy Low-fat or nonfat cheeses, including ricotta and mozzarella. Skim or 1% milk (liquid, powdered, or evaporated). Buttermilk made with low-fat milk. Nonfat or low-fat yogurt. Fats and oils Non-hydrogenated (trans-free) margarines. Vegetable oils, including soybean, sesame, sunflower, olive, avocado, peanut, safflower, corn, canola,  and cottonseed. Salad dressings or mayonnaise made with a vegetable oil. Beverages Water (mineral or sparkling). Coffee and tea. Unsweetened ice tea. Diet beverages. Sweets and desserts Sherbet, gelatin, and fruit ice. Small amounts of dark chocolate. Limit all sweets and desserts. Seasonings and condiments All seasonings and condiments. The items listed above may not be a complete list of  foods and beverages you can eat. Contact a dietitian for more options. What foods should I avoid? Fruits Canned fruit in heavy syrup. Fruit in cream or butter sauce. Fried fruit. Limit coconut. Vegetables Vegetables cooked in cheese, cream, or butter sauce. Fried vegetables. Grains Breads made with saturated or trans fats, oils, or whole milk. Croissants. Sweet rolls. Donuts. High-fat crackers, such as cheese crackers and chips. Meats and other proteins Fatty meats, such as hot dogs, ribs, sausage, bacon, rib-eye roast or steak. High-fat deli meats, such as salami and bologna. Caviar. Domestic duck and goose. Organ meats, such as liver. Dairy Cream, sour cream, cream cheese, and creamed cottage cheese. Whole-milk cheeses. Whole or 2% milk (liquid, evaporated, or condensed). Whole buttermilk. Cream sauce or high-fat cheese sauce. Whole-milk yogurt. Fats and oils Meat fat, or shortening. Cocoa butter, hydrogenated oils, palm oil, coconut oil, palm kernel oil. Solid fats and shortenings, including bacon fat, salt pork, lard, and butter. Nondairy cream substitutes. Salad dressings with cheese or sour cream. Beverages Regular sodas and any drinks with added sugar. Sweets and desserts Frosting. Pudding. Cookies. Cakes. Pies. Milk chocolate or white chocolate. Buttered syrups. Full-fat ice cream or ice cream drinks. The items listed above may not be a complete list of foods and beverages to avoid. Contact a dietitian for more information. Summary Heart-healthy meal planning includes limiting unhealthy fats, increasing healthy fats, limiting salt (sodium) intake and making other diet and lifestyle changes. Lose weight if you are overweight. Losing just 5-10% of your body weight can help your overall health and prevent diseases such as diabetes and heart disease. Focus on eating a balance of foods, including fruits and vegetables, low-fat or nonfat dairy, lean protein, nuts and legumes, whole grains,  and heart-healthy oils and fats. This information is not intended to replace advice given to you by your health care provider. Make sure you discuss any questions you have with your health care provider. Document Revised: 07/25/2021 Document Reviewed: 07/25/2021 Elsevier Patient Education  2024 [employer].

## 2023-03-02 ENCOUNTER — Other Ambulatory Visit: Payer: Self-pay | Admitting: Cardiology

## 2023-03-04 DIAGNOSIS — E119 Type 2 diabetes mellitus without complications: Secondary | ICD-10-CM | POA: Diagnosis not present

## 2023-03-08 ENCOUNTER — Telehealth: Payer: Self-pay | Admitting: Podiatry

## 2023-03-08 ENCOUNTER — Other Ambulatory Visit (HOSPITAL_COMMUNITY): Payer: Self-pay

## 2023-03-08 ENCOUNTER — Other Ambulatory Visit: Payer: Self-pay | Admitting: Podiatry

## 2023-03-08 ENCOUNTER — Other Ambulatory Visit: Payer: Self-pay

## 2023-03-08 MED ORDER — DILTIAZEM HCL ER COATED BEADS 240 MG PO CP24: 240.0000 mg | ORAL_CAPSULE | Freq: Every day | ORAL | 1 refills | Status: DC

## 2023-03-08 MED ORDER — NAPROXEN 500 MG PO TABS: 500.0000 mg | ORAL_TABLET | Freq: Two times a day (BID) | ORAL | 0 refills | Status: DC

## 2023-03-08 NOTE — Telephone Encounter (Signed)
Pt called stated she needs a medication refill of her naproxen. Pharmacy said they don't have anymore refills

## 2023-03-08 NOTE — Telephone Encounter (Signed)
Patient calls nurse line in requesting refills on testing strips and Diltiazem.  She reports [facility] never received [facility] sent in on 8/26. I have attempted to call [facility] on multiple occasions this morning without success.   Spoke with [place] and medication is covered by her insurance and a PA is not needed. I can only assume [facility] never got prescription on [facility].  Will forward to PCP to resend.

## 2023-03-12 ENCOUNTER — Other Ambulatory Visit: Payer: Self-pay | Admitting: Student

## 2023-03-12 DIAGNOSIS — Z794 Long term (current) use of insulin: Secondary | ICD-10-CM

## 2023-03-15 ENCOUNTER — Ambulatory Visit: Payer: Medicare Other | Admitting: Student

## 2023-03-15 DIAGNOSIS — E113591 Type 2 diabetes mellitus with proliferative diabetic retinopathy without macular edema, right eye: Secondary | ICD-10-CM | POA: Diagnosis not present

## 2023-03-15 DIAGNOSIS — H35373 Puckering of macula, bilateral: Secondary | ICD-10-CM | POA: Diagnosis not present

## 2023-03-15 DIAGNOSIS — E113592 Type 2 diabetes mellitus with proliferative diabetic retinopathy without macular edema, left eye: Secondary | ICD-10-CM | POA: Diagnosis not present

## 2023-03-15 DIAGNOSIS — H43813 Vitreous degeneration, bilateral: Secondary | ICD-10-CM | POA: Diagnosis not present

## 2023-03-15 DIAGNOSIS — H35033 Hypertensive retinopathy, bilateral: Secondary | ICD-10-CM | POA: Diagnosis not present

## 2023-03-16 ENCOUNTER — Other Ambulatory Visit: Payer: Self-pay | Admitting: Student

## 2023-03-16 DIAGNOSIS — Z794 Long term (current) use of insulin: Secondary | ICD-10-CM

## 2023-03-27 NOTE — Progress Notes (Unsigned)
Electrophysiology Office Note:   Date:  03/28/2023  ID:  Alicia [name], DOB 04-Jan-1956, MRN [id number]  Primary Cardiologist: Yvonne Kendall, MD Electrophysiologist: Regan Lemming, MD      History of Present Illness:   Alicia [name] is a 67 y.o. female with h/o hypertension, hyperlipidemia, SVT, diabetes, deafness, OSA seen today for routine electrophysiology followup.   Since last being seen in our clinic the patient reports doing overall well.  She continues to have episodes of SVT that occur on a monthly basis.  She takes her diltiazem which improves her symptoms in about 15 minutes.  She is quite happy with her control..  she denies chest pain, palpitations, dyspnea, PND, orthopnea, nausea, vomiting, dizziness, syncope, edema, weight gain, or early satiety.   Review of systems complete and found to be negative unless listed in HPI.   EP Information / Studies Reviewed:    EKG is not ordered today. EKG from 03/01/23 reviewed which showed sinus rhythm        Risk Assessment/Calculations:              Physical Exam:   VS:  BP 116/60 (BP Location: Left Arm, Patient Position: Sitting, Cuff Size: Normal)   Pulse 82   Ht 5\' 4"  (1.626 m)   Wt 151 lb 3.2 oz (68.6 kg)   SpO2 95%   BMI 25.95 kg/m    Wt Readings from Last 3 Encounters:  03/28/23 151 lb 3.2 oz (68.6 kg)  03/01/23 151 lb 9.6 oz (68.8 kg)  02/16/23 150 lb 6 oz (68.2 kg)     GEN: Well nourished, well developed in no acute distress NECK: No JVD; No carotid bruits CARDIAC: Regular rate and rhythm, no murmurs, rubs, gallops RESPIRATORY:  Clear to auscultation without rales, wheezing or rhonchi  ABDOMEN: Soft, non-tender, non-distended EXTREMITIES:  No edema; No deformity   ASSESSMENT AND PLAN:    1.  SVT: On carvedilol.  She has as needed diltiazem which does well to control her symptoms.  She is happy with her control.  No changes.  2.  Hypertension: Currently well-controlled  Follow up with EP APP in  12 months  Signed, Raymondo Garcialopez Jorja Loa, MD

## 2023-03-28 ENCOUNTER — Ambulatory Visit: Payer: Medicare Other | Attending: Cardiology | Admitting: Cardiology

## 2023-03-28 ENCOUNTER — Encounter: Payer: Self-pay | Admitting: Cardiology

## 2023-03-28 VITALS — BP 116/60 | HR 82 | Ht 64.0 in | Wt 151.2 lb

## 2023-03-28 DIAGNOSIS — I471 Supraventricular tachycardia, unspecified: Secondary | ICD-10-CM | POA: Diagnosis not present

## 2023-03-28 DIAGNOSIS — I10 Essential (primary) hypertension: Secondary | ICD-10-CM | POA: Diagnosis not present

## 2023-04-03 DIAGNOSIS — E119 Type 2 diabetes mellitus without complications: Secondary | ICD-10-CM | POA: Diagnosis not present

## 2023-04-04 ENCOUNTER — Encounter: Payer: Self-pay | Admitting: Student

## 2023-04-04 ENCOUNTER — Ambulatory Visit (INDEPENDENT_AMBULATORY_CARE_PROVIDER_SITE_OTHER): Payer: Medicare Other | Admitting: Student

## 2023-04-04 ENCOUNTER — Other Ambulatory Visit: Payer: Self-pay

## 2023-04-04 VITALS — BP 138/71 | HR 84 | Ht 64.0 in | Wt 149.6 lb

## 2023-04-04 DIAGNOSIS — E11311 Type 2 diabetes mellitus with unspecified diabetic retinopathy with macular edema: Secondary | ICD-10-CM | POA: Diagnosis not present

## 2023-04-04 DIAGNOSIS — Z794 Long term (current) use of insulin: Secondary | ICD-10-CM

## 2023-04-04 DIAGNOSIS — E1169 Type 2 diabetes mellitus with other specified complication: Secondary | ICD-10-CM

## 2023-04-04 LAB — POCT GLYCOSYLATED HEMOGLOBIN (HGB A1C): HbA1c, POC (controlled diabetic range): 7.9 % — AB (ref 0.0–7.0)

## 2023-04-04 MED ORDER — PANTOPRAZOLE SODIUM 40 MG PO TBEC
40.0000 mg | DELAYED_RELEASE_TABLET | Freq: Every day | ORAL | 1 refills | Status: DC
Start: 2023-04-04 — End: 2023-12-31

## 2023-04-04 NOTE — Progress Notes (Signed)
  SUBJECTIVE:   CHIEF COMPLAINT / HPI:   A1c Check / Preop Clearance -Pt wanting clearance for shoulder surgery. Was told A1c needed to be below ?7.5  - Last A1c 8.3 Lab Results  Component Value Date   HGBA1C 7.9 (A) 04/04/2023  Meds: Ozempic 2 mg qwk, basaglar 14 units, Humalog 2-4 units w/ meals, Metformin 1000 mg BID Reports good compliance with meds. And is hoping to get cleared for surgery, as she has limited use of her shoulder. Has had 2 episodes of hypotglycemia, ranging 40, 37.  Appreciates CBG range at home: 70-90 in the morning, 160 around lunch 200's in the evenings.     PERTINENT  PMH / PSH: DM 2, shoulder pain   OBJECTIVE:  BP 138/71   Pulse 84   Ht 5\' 4"  (1.626 m)   Wt 149 lb 9.6 oz (67.9 kg)   SpO2 97%   BMI 25.68 kg/m  Physical Exam Constitutional:      General: She is not in acute distress.    Appearance: Normal appearance. She is not ill-appearing.  Cardiovascular:     Rate and Rhythm: Normal rate and regular rhythm.     Pulses: Normal pulses.     Heart sounds: Normal heart sounds. No murmur heard.    No friction rub. No gallop.  Pulmonary:     Effort: Pulmonary effort is normal. No respiratory distress.     Breath sounds: Normal breath sounds. No stridor. No wheezing, rhonchi or rales.  Neurological:     Mental Status: She is alert.      ASSESSMENT/PLAN:  Type 2 diabetes mellitus with other specified complication, with long-term current use of insulin (HCC) Assessment & Plan: Patient comes in for A1c check, she hoping her A1c is in the range to allow her to have shoulder surgery. A1c range need to be below 7.5%.  A1c today 7.9, patient appreciates good compliance with medications.  Patient also notes issues with hypoglycemia in the mornings, and when she forgets to eat meals, likely secondary to her taking long-acting insulin in the evening. Unable to determine fasting sugar/adjust regimen 2/2 hypoglycemia in am from taking insulin at night. Will have  patient's start long-acting insulin in the mornings, and follow-up in 3 weeks, to determine if her diabetes regimen needs to be adjusted. -AM CBG check, daily insulin, and breakfast first thing in am - 14 units of Basaglar in the morning - Humalog to 2-4 units with meals - Ozempic 2 mg q. weekly - Metformin 1000 mg BID -follow-up 3 weeks - A1c check in 6 weeks  Orders: -     Pantoprazole Sodium; Take 1 tablet (40 mg total) by mouth daily.  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 Bess Kinds, MD 04/06/2023, 6:02 PM PGY-3, [facility]

## 2023-04-04 NOTE — Patient Instructions (Addendum)
It was great to see you! Thank you for allowing me to participate in your care!  I recommend that you always bring your medications to each appointment as this makes it easy to ensure we are on the correct medications and helps [name] not miss when refills are needed.  Our plans for today:  - Diabetes  Taking Basaglar Insulin in the morning  -First things when you wake: Check sugar, Take insuline, Eat something We are going to have you follow up in 3 weeks, so I can see what your sugars are looking like. Your A1c is 7.9, It needs to be under 7.5.   -Schedule appointment to be seen in 3 weeks  - We will likely recheck an A1c in 6 weeks  Take care and seek immediate care sooner if you develop any concerns.   Dr. Bess Kinds, MD [facility]

## 2023-04-06 NOTE — Assessment & Plan Note (Signed)
Patient comes in for A1c check, she hoping her A1c is in the range to allow her to have shoulder surgery. A1c range need to be below 7.5%.  A1c today 7.9, patient appreciates good compliance with medications.  Patient also notes issues with hypoglycemia in the mornings, and when she forgets to eat meals, likely secondary to her taking long-acting insulin in the evening. Unable to determine fasting sugar/adjust regimen 2/2 hypoglycemia in am from taking insulin at night. Will have patient's start long-acting insulin in the mornings, and follow-up in 3 weeks, to determine if her diabetes regimen needs to be adjusted. -AM CBG check, daily insulin, and breakfast first thing in am - 14 units of Basaglar in the morning - Humalog to 2-4 units with meals - Ozempic 2 mg q. weekly - Metformin 1000 mg BID -follow-up 3 weeks - A1c check in 6 weeks

## 2023-04-06 NOTE — Assessment & Plan Note (Signed)
>>  ASSESSMENT AND PLAN FOR DIABETES (HCC) WRITTEN ON 04/06/2023  6:02 PM BY JENNELLE RIIS, MD  Patient comes in for A1c check, she hoping her A1c is in the range to allow her to have shoulder surgery. A1c range need to be below 7.5%.  A1c today 7.9, patient appreciates good compliance with medications.  Patient also notes issues with hypoglycemia in the mornings, and when she forgets to eat meals, likely secondary to her taking long-acting insulin  in the evening. Unable to determine fasting sugar/adjust regimen 2/2 hypoglycemia in am from taking insulin  at night. Will have patient's start long-acting insulin  in the mornings, and follow-up in 3 weeks, to determine if her diabetes regimen needs to be adjusted. -AM CBG check, daily insulin , and breakfast first thing in am - 14 units of Basaglar  in the morning - Humalog  to 2-4 units with meals - Ozempic  2 mg q. weekly - Metformin  1000 mg BID -follow-up 3 weeks - A1c check in 6 weeks

## 2023-04-09 ENCOUNTER — Telehealth: Payer: Self-pay | Admitting: Cardiology

## 2023-04-09 MED ORDER — CARVEDILOL 25 MG PO TABS: ORAL_TABLET | ORAL | 3 refills | Status: DC

## 2023-04-09 NOTE — Telephone Encounter (Signed)
*  STAT* If patient is at the pharmacy, call can be transferred to refill team.   1. Which medications need to be refilled? (please list name of each medication and dose if known)  need a new prescription for Carvedilol   2. Would you like to learn more about the convenience, safety, & potential cost savings by using the [facility]?    3. Are you open to using the [facility] (Type [facility].   4. Which pharmacy/location (including street and city if local pharmacy) is medication to be sent to?  [facility] [address], [name]   5. Do they need a 30 day or 90 day supply? 90 days # 180 and refills- please call today- been out of medicine  for 2 weeks

## 2023-04-09 NOTE — Telephone Encounter (Signed)
Pt's medication was sent to pt's pharmacy as requested. Confirmation received.  °

## 2023-04-11 ENCOUNTER — Other Ambulatory Visit: Payer: Self-pay | Admitting: Podiatry

## 2023-04-20 ENCOUNTER — Ambulatory Visit (INDEPENDENT_AMBULATORY_CARE_PROVIDER_SITE_OTHER): Payer: Medicare Other

## 2023-04-20 ENCOUNTER — Ambulatory Visit: Payer: Medicare Other | Admitting: Podiatry

## 2023-04-20 DIAGNOSIS — M21612 Bunion of left foot: Secondary | ICD-10-CM

## 2023-04-20 DIAGNOSIS — M21611 Bunion of right foot: Secondary | ICD-10-CM | POA: Diagnosis not present

## 2023-04-20 DIAGNOSIS — M79674 Pain in right toe(s): Secondary | ICD-10-CM

## 2023-04-20 DIAGNOSIS — M21619 Bunion of unspecified foot: Secondary | ICD-10-CM

## 2023-04-20 DIAGNOSIS — M79675 Pain in left toe(s): Secondary | ICD-10-CM | POA: Diagnosis not present

## 2023-04-20 DIAGNOSIS — B351 Tinea unguium: Secondary | ICD-10-CM | POA: Diagnosis not present

## 2023-04-20 DIAGNOSIS — M7752 Other enthesopathy of left foot: Secondary | ICD-10-CM | POA: Diagnosis not present

## 2023-04-20 DIAGNOSIS — E1149 Type 2 diabetes mellitus with other diabetic neurological complication: Secondary | ICD-10-CM | POA: Diagnosis not present

## 2023-04-20 NOTE — Patient Instructions (Addendum)
For the topical I like Voltaren gel or Aspercreme with lidocaine or Biofreeze.   -  While at your visit today you received a steroid injection in your foot or ankle to help with your pain. Along with having the steroid medication there is some "numbing" medication in the shot that you received. Due to this you may notice some numbness to the area for the next couple of hours.   I would recommend limiting activity for the next few days to help the steroid injection take affect.    The actually benefit from the steroid injection may take up to 2-7 days to see a difference. You may actually experience a small (as in 10%) INCREASE in pain in the first 24 hours---that is common. It would be best if you can ice the area today and take anti-inflammatory medications (such as Ibuprofen, Motrin, or Aleve) if you are able to take these medications. If you were prescribed another medication to help with the pain go ahead and start that medication today    Things to watch out for that you should contact us or a health care provider urgently would include: 1. Unusual (as in more than 10%) increase in pain 2. New fever > 101.5 3. New swelling or redness of the injected area.  4. Streaking of red lines around the area injected.  If you have any questions or concerns about this, please give our office a call at [phone].

## 2023-04-20 NOTE — Progress Notes (Signed)
Subjective: Chief Complaint  Patient presents with   Foot Pain    Patient is having bilateral great toe pain and RFC   67 year old female presents with above concerns.  She states she has her nails trimmed are thick and long and she is not able to do more self.  No splinter redness or drainage the toenail sites.  She states that she began some pain last several weeks of left big toe joint points along the IPJ.  She is not sure if this is infection, gout, bunions or what is going on.  No injuries.  No treatment.  Last A1c was 7.9 on 04/04/2023  She is on gabapentin   Interpreter number: [id number]  Objective: AAO x3, NAD DP/PT pulses palpable bilaterally, CRT less than 3 seconds Nails are hypertrophic, dystrophic, brittle, discolored, elongated 10. No surrounding redness or drainage. Tenderness nails 1-5 bilaterally. No open lesions or pre-ulcerative lesions are identified today. Mild bunion is present.  Tenderness is noted along the hallux IPJ she states with walking.  Not able to elicit significant pain to this area today.  There is no edema, erythema, open lesions or any signs of infection. No pain with calf compression, swelling, warmth, erythema  Assessment: 67 year old female symptomatic onychomycosis, bunion with capsulitis  Plan: -All treatment options discussed with the patient including all alternatives, risks, complications.  -X-rays obtained reviewed.  3 views of the feet were obtained.  No evidence of acute fracture.  Bunion is present. -Epigastric foot pain we discussed shoes, good arch support as well as wearing shoes avoid any excess pressure.  Dispensed a toe cap to see if this will help as well.  Discussed topical medication such as Voltaren gel. -Sharp debridement nails x 10 without any complications or bleeding.  Return in about 3 months (around 07/21/2023) for nail trim.  Vivi Barrack DPM

## 2023-04-25 ENCOUNTER — Other Ambulatory Visit: Payer: Self-pay

## 2023-04-25 ENCOUNTER — Ambulatory Visit: Payer: Medicare Other | Admitting: Student

## 2023-04-25 ENCOUNTER — Encounter: Payer: Self-pay | Admitting: Student

## 2023-04-25 VITALS — BP 149/84 | HR 79 | Ht 64.0 in | Wt 149.8 lb

## 2023-04-25 DIAGNOSIS — I10 Essential (primary) hypertension: Secondary | ICD-10-CM

## 2023-04-25 DIAGNOSIS — Z23 Encounter for immunization: Secondary | ICD-10-CM | POA: Diagnosis not present

## 2023-04-25 DIAGNOSIS — E11311 Type 2 diabetes mellitus with unspecified diabetic retinopathy with macular edema: Secondary | ICD-10-CM

## 2023-04-25 DIAGNOSIS — Z7985 Long-term (current) use of injectable non-insulin antidiabetic drugs: Secondary | ICD-10-CM | POA: Diagnosis not present

## 2023-04-25 NOTE — Assessment & Plan Note (Signed)
Patient BP elevated today, did not recheck BP prior to end of visit. Suspect patient did not take medication this morning vs white coat htn, will follow up at next visit. BP usually well controlled.  -CTM

## 2023-04-25 NOTE — Progress Notes (Cosign Needed Addendum)
SUBJECTIVE:   CHIEF COMPLAINT / HPI:   Diabetes F/u -Patient wanting clearance for surgery, needing better control of daibetes -Adjusted timing of insulin to help w/ hypoglycemia, Meds:Ozempic 2 mg qwk, basaglar 14 units, Humalog 2-4 units w/ meals, Metformin 1000 mg BID   Today: Cbg checks at home: Having hypoglycemia to 40's in the morning. , still having low cbg's if she doesn't eat dinner or food the night before.  Morning CBG: 40-190 Lunch CBG:140-170 Dinner CBG:140-170 Bed CBG 80-150  HTN BP Readings from Last 3 Encounters:  04/25/23 (!) 149/84  04/04/23 138/71  03/28/23 116/60  Meds: Valsartan 160 mg, hydrochlorothiazide 12.5 mg, Coreg 25 mg   PERTINENT  PMH / PSH:    OBJECTIVE:  BP (!) 149/84   Pulse 79   Ht 5\' 4"  (1.626 m)   Wt 149 lb 12.8 oz (67.9 kg)   SpO2 98%   BMI 25.71 kg/m  Physical Exam Constitutional:      General: She is not in acute distress.    Appearance: Normal appearance. She is normal weight. She is not ill-appearing.  Cardiovascular:     Rate and Rhythm: Normal rate and regular rhythm.     Pulses: Normal pulses.     Heart sounds: Normal heart sounds. No murmur heard.    No friction rub. No gallop.  Pulmonary:     Effort: Pulmonary effort is normal. No respiratory distress.     Breath sounds: Normal breath sounds. No stridor. No wheezing, rhonchi or rales.  Abdominal:     General: Abdomen is flat. There is no distension.     Palpations: Abdomen is soft. There is no mass.     Tenderness: There is no abdominal tenderness. There is no guarding.     Hernia: No hernia is present.  Neurological:     Mental Status: She is alert.  Psychiatric:        Mood and Affect: Mood normal.        Behavior: Behavior normal.      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  Patient reporting taking Basaglar mornings.  Patient still reports having episodes of hypoglycemia, with morning fasting sugars down in the 40s up to the 190s, depending on if she eats dinner the night before.  Patient still forgetting to eat dinner some nights.  Patient CBG ranges otherwise 140-170 throughout the day.  Patient nearly at appropriate dose of insulin however, given risk of hypoglycemia, will want to lower long-acting insulin.  A1c may increase between now and next visit, plan for short follow-up, to trend CBGs and make adjustments. - Basaglar 10 units in the morning - Humalog 2 to 4 units with meals - Ozempic 2 mg every week - Metformin 1000 mg twice daily - Follow-up 2 months - Patient to send CBG record in 1 month via MyChart   Encounter for immunization -     Flu Vaccine Trivalent High Dose (Fluad) -     Pfizer Comirnaty Covid-19 Vaccine 64yrs & older  Hypertension, unspecified type Assessment & Plan: Patient BP elevated today, did not recheck BP prior to end of visit. Suspect patient did not take medication this morning vs white coat htn, will follow up at next visit. BP usually well controlled.  -CTM    No follow-ups on file. Bess Kinds, MD 04/25/2023,  3:41 PM PGY-3, [facility]

## 2023-04-25 NOTE — Patient Instructions (Addendum)
It was great to see you! Thank you for allowing me to participate in your care!  I recommend that you always bring your medications to each appointment as this makes it easy to ensure we are on the correct medications and helps [name] not miss when refills are needed.  Our plans for today:  - Because of your low sugars we have to drop your long acting insulin.   Basaglar 10 units in the morning, daily  Humalog 2-4 units with meals  Ozempic 2 mg every week  Metformin 1000 mg BID    Make Follow up appointment in 8 weeks *Message me your Morning/Lunch/Evening/Bedtime Sugars in 4 weeks through MyChart  - Low Blood Sugar Set Alarms for your dinner meals, everyday (keep a snack incase you can't eat right just then). If you can remember to eat dinner, it will make your A1c better sooner. We have to give less insulin for now, because of how dangerous low sugar is.  You can DIE from low blood sugar.   Take care and seek immediate care sooner if you develop any concerns.   Dr. Bess Kinds, MD [facility]

## 2023-04-25 NOTE — Assessment & Plan Note (Addendum)
Patient comes for follow-up of her diabetes.  Patient reporting taking Basaglar mornings.  Patient still reports having episodes of hypoglycemia, with morning fasting sugars down in the 40s up to the 190s, depending on if she eats dinner the night before.  Patient still forgetting to eat dinner some nights.  Patient CBG ranges otherwise 140-170 throughout the day.  Patient nearly at appropriate dose of insulin however, given risk of hypoglycemia, will want to lower long-acting insulin.  A1c may increase between now and next visit, plan for short follow-up, to trend CBGs and make adjustments. - Basaglar 10 units in the morning - Humalog 2 to 4 units with meals - Ozempic 2 mg every week - Metformin 1000 mg twice daily - Follow-up 2 months - Patient to send CBG record in 1 month via MyChart

## 2023-05-02 ENCOUNTER — Other Ambulatory Visit: Payer: Self-pay | Admitting: Student

## 2023-05-04 DIAGNOSIS — E119 Type 2 diabetes mellitus without complications: Secondary | ICD-10-CM | POA: Diagnosis not present

## 2023-05-18 ENCOUNTER — Telehealth: Payer: Self-pay

## 2023-05-18 ENCOUNTER — Other Ambulatory Visit: Payer: Self-pay | Admitting: Student

## 2023-05-18 DIAGNOSIS — Z1231 Encounter for screening mammogram for malignant neoplasm of breast: Secondary | ICD-10-CM

## 2023-05-18 NOTE — Telephone Encounter (Signed)
Patient calls nurse line reporting a breast problem.   She reports [facility] to [facility] and reported nipple pain.   The [facility] is requesting a diagnostic vs annual screening. I advised she would need to be evaluated.   Patient reports bilateral nipple pain for ~ 2 weeks. She denies any redness, warmth, swelling or discharge. She reports they both have a "burning sensation." She reports it is hard explain.   Patient scheduled for 11/19 for evaluation.   Precautions discussed in meantime.

## 2023-05-22 ENCOUNTER — Ambulatory Visit (INDEPENDENT_AMBULATORY_CARE_PROVIDER_SITE_OTHER): Payer: Medicare Other | Admitting: Family Medicine

## 2023-05-22 ENCOUNTER — Encounter: Payer: Self-pay | Admitting: Family Medicine

## 2023-05-22 VITALS — BP 173/80 | HR 74 | Ht 64.0 in | Wt 150.4 lb

## 2023-05-22 DIAGNOSIS — I10 Essential (primary) hypertension: Secondary | ICD-10-CM

## 2023-05-22 DIAGNOSIS — N644 Mastodynia: Secondary | ICD-10-CM | POA: Diagnosis not present

## 2023-05-22 NOTE — Patient Instructions (Signed)
Good to see you today - Thank you for coming in  Things we discussed today:  1) The pain in your left breast is most likely due to strain of the muscles of your chest. However, let's get a diagnostic mammogram to check for other concerning causes. - You can take ibuprofen every 6 hours for the next week to help decrease the inflammation. Afterwards, you can take the ibuprofen as needed. - You can also add tylenol to help with the pain as well  Please call the [facility] to schedule a mammogram.  [facility] [address] [place],  [place]  [address] [phone]   2) Your blood pressure was elevated today. Come back in 1 month so we can recheck your blood pressure and reassess your breast pain.

## 2023-05-22 NOTE — Progress Notes (Signed)
    SUBJECTIVE:   CHIEF COMPLAINT / HPI:   Alicia [name] is a 67 yo F w/ hx of T2DM, HTN, deafness that p/f breast pain.  An in person sign language interpreter was present. - Both nipples stinging for past 2 weeks. Denies any identifiable trigger. She was asleep, then pain woke her from sleep.  - The pain went away for a few days, then came back. It has been intermittent.  - Denies nipple discharge, breast swelling, or breast lumps - Reports pain is right on the nipples. Pain does not radiate  - No family hx of similar symptoms. - Denies personal or family hx of breast cancer. Potentially grandmother had ovarian cancer.  - Reports getting regular mammograms in the past, no abnormal findings in the past.  - L breast hurts worse than R breast.   OBJECTIVE:   BP (!) 173/80   Pulse 74   Ht 5\' 4"  (1.626 m)   Wt 150 lb 6.4 oz (68.2 kg)   SpO2 98%   BMI 25.82 kg/m   General: Alert, pleasant woman. NAD. HEENT: NCAT. MMM. CV: RRR, no murmurs.  Resp: Normal WOB on RA.  Ext: Moves all ext spontaneously Skin: Warm, well perfused  Breast: Tenderness to palpation of RUQ of L breast and L sternal border, no pain over nipples, no lumps palpated. No axillary lymphadenopathy in L axilla  CMA [place] was present as chaperone  ASSESSMENT/PLAN:   Assessment & Plan Breast pain Differential includes MSK pain, mastitis, breast cancer.  Given location of pain and otherwise benign breast exam, suspect that pain is actually originating from muscles of chest wall or costochondritis.  Mastitis or other infections are less likely given lack of skin changes or breast changes. Breast cancer also less likely given lack of masses or LAD.  -Recommend taking ibuprofen scheduled q6h for next week, then resuming as needed dosing -Diagnostic mammogram ordered Primary hypertension BP elevated in clinic today, thought to be related to breast pain.  Will have patient follow-up in 1 month to recheck blood pressure and  reassess breast pain.    Lincoln Brigham, MD [facility]

## 2023-05-22 NOTE — Assessment & Plan Note (Signed)
BP elevated in clinic today, thought to be related to breast pain.  Will have patient follow-up in 1 month to recheck blood pressure and reassess breast pain.

## 2023-05-24 ENCOUNTER — Other Ambulatory Visit: Payer: Self-pay | Admitting: Student

## 2023-05-24 ENCOUNTER — Other Ambulatory Visit: Payer: Self-pay | Admitting: Cardiology

## 2023-05-24 DIAGNOSIS — E118 Type 2 diabetes mellitus with unspecified complications: Secondary | ICD-10-CM

## 2023-06-03 DIAGNOSIS — E119 Type 2 diabetes mellitus without complications: Secondary | ICD-10-CM | POA: Diagnosis not present

## 2023-06-26 ENCOUNTER — Other Ambulatory Visit: Payer: Self-pay | Admitting: Student

## 2023-06-28 ENCOUNTER — Ambulatory Visit: Payer: Medicare Other

## 2023-07-02 ENCOUNTER — Ambulatory Visit: Payer: Medicare Other | Admitting: Student

## 2023-07-02 ENCOUNTER — Encounter: Payer: Self-pay | Admitting: Student

## 2023-07-02 VITALS — BP 130/62 | HR 83 | Ht 64.0 in | Wt 152.6 lb

## 2023-07-02 DIAGNOSIS — E11311 Type 2 diabetes mellitus with unspecified diabetic retinopathy with macular edema: Secondary | ICD-10-CM

## 2023-07-02 LAB — POCT GLYCOSYLATED HEMOGLOBIN (HGB A1C): HbA1c, POC (controlled diabetic range): 8.2 % — AB (ref 0.0–7.0)

## 2023-07-02 NOTE — Patient Instructions (Addendum)
It was great to see you! Thank you for allowing me to participate in your care!  I recommend that you always bring your medications to each appointment as this makes it easy to ensure we are on the correct medications and helps [name] not miss when refills are needed.  Our plans for today:  - Diabetes  Your A1c is 8.2 up from last time at 7.9. We are going to adjust your insulin regimen - Basaglar 8 units in the morning - Humalog 4 to 6 units with meals - Ozempic 2 mg every week - Metformin 1000 mg twice daily - Follow-up 2 months  Eating Not eating is a big issues and limiting our ability to manage your diabetes better. We cannot increase the amount of insulin you need, to control your blood sugar, because you are getting hypoglycemic from not eating. This is VERY dangerous and you could die from this!  Set an alarm on your phone to remind you to eat Breakfast, Lunch, and Dinner.   -Shoulder Surgery I will message your orthopedist to find out if they are willing to perform your surgery, with an elevated A1c.     Take care and seek immediate care sooner if you develop any concerns.   Dr. Bess Kinds, MD [facility]

## 2023-07-02 NOTE — Progress Notes (Cosign Needed)
  SUBJECTIVE:   CHIEF COMPLAINT / HPI:   Diabetes F/u -Seen 10/23, patient wanting shoulder surgery, but needs better A1c control Meds: Basaglar 10 units in the morning, Humalog 2 to 4 units with meals, Ozempic 2 mg every week, Metformin 1000 mg twice daily Lab Results  Component Value Date   HGBA1C 7.9 (A) 04/04/2023   Today:  Hasn't been checking sugars at home, since last visit. Sometimes still get's hypoglycemic and forgets to eat. Has been hypoglycemic x 3 since last visit with CBG 40-60. Sugars have been getting up to the 300s.     PERTINENT  PMH / PSH:   OBJECTIVE:  There were no vitals taken for this visit. Physical Exam Constitutional:      General: She is not in acute distress.    Appearance: Normal appearance. She is not ill-appearing.  Cardiovascular:     Rate and Rhythm: Normal rate and regular rhythm.     Pulses: Normal pulses.     Heart sounds: Normal heart sounds. No murmur heard.    No friction rub. No gallop.  Pulmonary:     Effort: Pulmonary effort is normal. No respiratory distress.     Breath sounds: Normal breath sounds. No stridor. No wheezing, rhonchi or rales.  Abdominal:     General: Bowel sounds are normal. There is no distension.     Palpations: Abdomen is soft. There is no mass.     Tenderness: There is no abdominal tenderness. There is no guarding.     Hernia: No hernia is present.  Neurological:     Mental Status: She is alert.      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  Patient's A1c increased to 8.2, from 7.9.  Patient appreciates CBGs ranging around 220 fasting, and mid 300s during the day.  Patient still continues to have episodes of hypoglycemia x 3 since the last visit.  Patient forgets to eat at times, causing her hypoglycemia.   Discussed using alarm on phone to remind patient to eat.  Also discussed need to have no episodes of hypoglycemia before we could adjust insulin dose higher, to better control sugars.  Patient wanting sugars better controlled for shoulder surgery.  Provider will reach out to orthopedist and discuss need to wait versus proceed at current A1c.  Will adjust insulin regimen, for better mealtime control, less long-acting. - Basaglar 8 units in the morning - Humalog 4 to 6 units with meals - Metformin 1000 mg twice daily - Ozempic 2 mg weekly - Follow-up 2 months - Provider to message orthopedist about surgery - Patient to set alarms on phone for meals, to help prevent hypoglycemic episodes No follow-ups on file. Bess Kinds, MD 07/02/2023, 6:59 AM PGY-3, [facility]

## 2023-07-02 NOTE — Assessment & Plan Note (Addendum)
Patient comes in for follow-up of her diabetes.  Patient's A1c increased to 8.2, from 7.9.  Patient appreciates CBGs ranging around 220 fasting, and mid 300s during the day.  Patient still continues to have episodes of hypoglycemia x 3 since the last visit.  Patient forgets to eat at times, causing her hypoglycemia.  Discussed using alarm on phone to remind patient to eat.  Also discussed need to have no episodes of hypoglycemia before we could adjust insulin dose higher, to better control sugars.  Patient wanting sugars better controlled for shoulder surgery.  Provider will reach out to orthopedist and discuss need to wait versus proceed at current A1c.  Will adjust insulin regimen, for better mealtime control, less long-acting. - Basaglar 8 units in the morning - Humalog 4 to 6 units with meals - Metformin 1000 mg twice daily - Ozempic 2 mg weekly - Follow-up 2 months - Provider to message orthopedist about surgery - Patient to set alarms on phone for meals, to help prevent hypoglycemic episodes

## 2023-07-04 DIAGNOSIS — E119 Type 2 diabetes mellitus without complications: Secondary | ICD-10-CM | POA: Diagnosis not present

## 2023-07-07 ENCOUNTER — Other Ambulatory Visit: Payer: Self-pay | Admitting: Podiatry

## 2023-07-11 ENCOUNTER — Telehealth: Payer: Self-pay

## 2023-07-11 NOTE — Telephone Encounter (Signed)
 Patient calls nurse line reporting elevated blood sugars.   She reports for ~ 1 week her cbgs have been in the high 300s. She denies any change in diet or medications. No new medications.   She reports compliance with diabetes medications.  She denies any excessive thirst, excessive urination, fatigue or vision changes.   Patient scheduled for 01/10 for evaluation.   ED precautions discussed with patient.

## 2023-07-12 DIAGNOSIS — H43813 Vitreous degeneration, bilateral: Secondary | ICD-10-CM | POA: Diagnosis not present

## 2023-07-12 DIAGNOSIS — E113591 Type 2 diabetes mellitus with proliferative diabetic retinopathy without macular edema, right eye: Secondary | ICD-10-CM | POA: Diagnosis not present

## 2023-07-12 DIAGNOSIS — H35033 Hypertensive retinopathy, bilateral: Secondary | ICD-10-CM | POA: Diagnosis not present

## 2023-07-12 DIAGNOSIS — H35373 Puckering of macula, bilateral: Secondary | ICD-10-CM | POA: Diagnosis not present

## 2023-07-12 DIAGNOSIS — E113592 Type 2 diabetes mellitus with proliferative diabetic retinopathy without macular edema, left eye: Secondary | ICD-10-CM | POA: Diagnosis not present

## 2023-07-13 ENCOUNTER — Encounter: Payer: Self-pay | Admitting: Family Medicine

## 2023-07-13 ENCOUNTER — Ambulatory Visit (INDEPENDENT_AMBULATORY_CARE_PROVIDER_SITE_OTHER): Payer: Medicare Other | Admitting: Family Medicine

## 2023-07-13 VITALS — BP 140/77 | HR 84 | Ht 64.0 in | Wt 194.4 lb

## 2023-07-13 DIAGNOSIS — Z7985 Long-term (current) use of injectable non-insulin antidiabetic drugs: Secondary | ICD-10-CM

## 2023-07-13 DIAGNOSIS — E11311 Type 2 diabetes mellitus with unspecified diabetic retinopathy with macular edema: Secondary | ICD-10-CM

## 2023-07-13 MED ORDER — INSULIN GLARGINE 100 UNIT/ML SOLOSTAR PEN: 10.0000 [IU] | PEN_INJECTOR | SUBCUTANEOUS | 1 refills | Status: DC

## 2023-07-13 NOTE — Progress Notes (Signed)
    SUBJECTIVE:   CHIEF COMPLAINT / HPI:   DW is a 68yo F w/ hx of deafness, T2DM that p/f elevated glucose. - In person sign language interpreter was present.  - Started to have elevated blood sugars starting last Friday (1 wk ago) - BG as high as 418-500 - Since yesterday, number coming back down to 130 - Dad's wife gave her some vitamins to take. (Unsure what they were) but reports they helped her sugars. - Was also having a cold, coughing, drainage around last week too. Eye were itchy, congested. No fevers. This has resolved.  - She reports she has been taking her meds as directed otherwise. - Pt is planning to get surgery for her L arm, but she will need to get her A1c controlled before they will do the surgery  Diabetes Regimen: - Basaglar  10 units in the morning - Humalog  4 to 6 units with meals - Metformin  1000 mg twice daily - Ozempic  2 mg weekly  - Needs refill on lantus  pen. Reports that she lost her pen  OBJECTIVE:   BP (!) 140/77   Pulse 84   Ht 5' 4 (1.626 m)   Wt 194 lb 6.4 oz (88.2 kg)   SpO2 94%   BMI 33.37 kg/m   General: Alert, pleasant woman. NAD. HEENT: NCAT. MMM. CV: RRR, no murmurs.  Resp: CTAB, no wheezing or crackles. Normal WOB on RA.  Abm: Soft, nontender, nondistended. BS present. 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 Glucose now back to baseline after URI resolved. Provided reassurance and discussed that she may need higher insulin  doses in the future during infections. Also provided refill for lantus  pen.    Twyla Nearing, MD [facility]

## 2023-07-13 NOTE — Patient Instructions (Addendum)
 Good to see you today - Thank you for coming in  Things we discussed today:  1) Continue to take 10 units of your lantus  (glargine) insulin .  2) your high sugars were most likely due to having a viral infection (cold), which can cause her sugars to go up.  As your body recovers from the cold, your sugars will come back to normal as well.

## 2023-07-15 NOTE — Assessment & Plan Note (Signed)
>>  ASSESSMENT AND PLAN FOR DIABETES (HCC) WRITTEN ON 07/15/2023 11:33 PM BY ZHENG, JACKY, MD  Suspect elevated glucose was 2/2 to active infxn (viral URI) around the same time. Glucose now back to baseline after URI resolved. Provided reassurance and discussed that she may need higher insulin  doses in the future during infections. Also provided refill for lantus  pen.

## 2023-07-15 NOTE — Assessment & Plan Note (Signed)
 Suspect elevated glucose was 2/2 to active infxn (viral URI) around the same time. Glucose now back to baseline after URI resolved. Provided reassurance and discussed that she may need higher insulin  doses in the future during infections. Also provided refill for lantus  pen.

## 2023-07-17 ENCOUNTER — Ambulatory Visit: Payer: Medicare Other | Admitting: Podiatry

## 2023-07-19 ENCOUNTER — Ambulatory Visit
Admission: RE | Admit: 2023-07-19 | Discharge: 2023-07-19 | Disposition: A | Discharge status: Home / Self Care | Payer: Medicare Other | Source: Ambulatory Visit

## 2023-07-19 DIAGNOSIS — Z1231 Encounter for screening mammogram for malignant neoplasm of breast: Secondary | ICD-10-CM

## 2023-07-20 ENCOUNTER — Encounter: Payer: Self-pay | Admitting: Student

## 2023-07-20 ENCOUNTER — Telehealth: Payer: Self-pay | Admitting: Student

## 2023-07-20 ENCOUNTER — Ambulatory Visit: Payer: Medicare Other | Admitting: Podiatry

## 2023-07-20 NOTE — Telephone Encounter (Signed)
Provider called patient to find out the name of the practice she went to, that is supposed to offer her shoulder repair.  Provider was unable to find practice name in her chart, and was attempting to reach out to the practice and asked if they would see the patient/consider doing the surgery despite A1c not being at goal.  As provider is unable to find name of the practice, call patient to inquire what the name of the practice is, or name of provider that she saw at the practice.  Patient to call clinic back and leave a message with RN, detailing name of practice, or provider, patient saw for her anticipated shoulder surgery.  Provider left message requesting callback, with provider/practice name.

## 2023-07-31 NOTE — Telephone Encounter (Signed)
Patient calls nurse line in regards to ortho specialist.   She reports she would like to go to [facility].   Advised will forward to PCP for referral if appropriate.

## 2023-08-04 DIAGNOSIS — E119 Type 2 diabetes mellitus without complications: Secondary | ICD-10-CM | POA: Diagnosis not present

## 2023-08-08 ENCOUNTER — Telehealth: Payer: Self-pay

## 2023-08-08 NOTE — Telephone Encounter (Signed)
   Pre-operative Risk Assessment    Patient Name: Alicia [name]  DOB: 03-Feb-1956 MRN: [id number]   Date of last office visit: 03/28/23 with Dr. Inocencio  Date of next office visit: None   Request for Surgical Clearance    Procedure:   Left Reverse Shoulder Arthroplasty  Date of Surgery:  Clearance TBD                                 Surgeon:  Dr. Selinda Gosling Surgeon's Group or Practice Name:  [facility] Phone number:  [phone] [address] Fax number:  [phone]   Type of Clearance Requested:   - Medical  - Pharmacy:  Hold Aspirin not indicated    Type of Anesthesia:   choice   Additional requests/questions:    Bonney Augustin JONETTA Delores   08/08/2023, 4:46 PM

## 2023-08-09 NOTE — Telephone Encounter (Signed)
   Name: Alicia [name]  DOB: [birth year]  MRN: [id number]  Primary Cardiologist: Lonni Hanson, MD  Chart reviewed as part of pre-operative protocol coverage. Because of [name]'s past medical history and time since last visit, she will require a follow-up in-office visit in order to better assess preoperative cardiovascular risk.  Patient requires sign language interpreter.  Pre-op covering staff: - Please schedule appointment and call patient to inform them. If patient already had an upcoming appointment within acceptable timeframe, please add pre-op clearance to the appointment notes so provider is aware. - Please contact requesting surgeon's office via preferred method (i.e, phone, fax) to inform them of need for appointment prior to surgery.  Per office protocol, if patient is without any new symptoms or concerns at the time of their visit, she may hold aspirin for 7 days prior to procedure.  Please resume aspirin as soon as possible postprocedure at the discretion of the surgeon.  [name] D Mindel Friscia, NP  08/09/2023, 8:05 AM

## 2023-08-09 NOTE — Telephone Encounter (Signed)
 I s/w the sign language interpreter who took a message to relay to the pt to call the office to schedule in office appt for preop clearance.

## 2023-08-10 NOTE — Telephone Encounter (Signed)
 I will update all parties involved pt has appt 08/21/23. Chart is flagged that the pt needs: Needs Interpreter: Sign Language

## 2023-08-10 NOTE — Telephone Encounter (Signed)
  The patient returned the call using an interpreter and scheduled an appointment with Reesa Cannon on 08/21/23 at 8:45 AM. She wants to ensure that an interpreter will be available for the appointment.

## 2023-08-13 NOTE — Progress Notes (Signed)
  Cardiology Office Note:  .   Date:  08/21/2023  ID:  Alicia [name], DOB 1955/08/21, MRN [id number] PCP: Bess Kinds, MD  [facility] Providers Cardiologist:  Yvonne Kendall, MD Electrophysiologist:  Will Jorja Loa, MD    History of Present Illness: .   Alicia [name] is a 68 y.o. female  with h/o hypertension, hyperlipidemia, SVT, diabetes, deafness, OSA.  Patient is on my schedule for preop clearance for should surgery. [name] interpreter from [place] here for sign language. She has palpitations on occasion ~ 1 a week she has to take an extra cardizem 30 mg. They last about 20-30 min and ease off. She doesn't have any other associated symptoms. Denies chest pain, dyspnea, dizziness, edema. She walks a lot in the summer but not in cold weather. Walks in the house. No recent blood work.   ROS:    Studies Reviewed: Marland Kitchen    EKG Interpretation Date/Time:  Tuesday August 21 2023 08:44:35 EST Ventricular Rate:  92 PR Interval:  172 QRS Duration:  82 QT Interval:  358 QTC Calculation: 442 R Axis:   71  Text Interpretation: Normal sinus rhythm Nonspecific ST abnormality When compared with ECG of 01-Mar-2023 11:21, No significant change was found Confirmed by Jacolyn Reedy [phone]) on 08/21/2023 8:58:19 AM    Prior CV Studies:   NST 2019 1. EF 61%, normal wall motion.  2. No evidence for ischemia or infarction by perfusion images.     Normal study.   Risk Assessment/Calculations:             Physical Exam:   VS:  BP 120/80   Pulse 92   Ht 5\' 4"  (1.626 m)   Wt 149 lb 3.2 oz (67.7 kg)   SpO2 99%   BMI 25.61 kg/m    Wt Readings from Last 3 Encounters:  08/21/23 149 lb 3.2 oz (67.7 kg)  07/13/23 194 lb 6.4 oz (88.2 kg)  07/02/23 152 lb 9.6 oz (69.2 kg)    GEN: Well nourished, well developed in no acute distress NECK: No JVD; No carotid bruits CARDIAC:   RRR, no murmurs, rubs, gallops RESPIRATORY:  Clear to auscultation without rales, wheezing or rhonchi   ABDOMEN: Soft, non-tender, non-distended EXTREMITIES:  No edema; No deformity   ASSESSMENT AND PLAN: .    Preop clearance for left reverse shoulder arthroplasty by Dr. Duwayne Heck, [facility]. Patient is an acceptable risk to proceed with above surgery based on current guidelines.  According to the Revised Cardiac Risk Index (RCRI), her Perioperative Risk of Major Cardiac Event is (%): 0.9  Her Functional Capacity in METs is: 5.07 according to the Duke Activity Status Index (DASI).  SVT on coreg and prn diltiazem she takes extra about once a week.  HTN well controlled  HLD LDL 90 02/2023  DM managed by PCP  Deafness-interpreter here.  OSA mild in 2018 and insurance wouldn't pay for CPAP. She snores and nods off a lot during the day. Needs another sleep study. Will order.         Dispo: f/u in 1 yr.  Signed, Jacolyn Reedy, PA-C

## 2023-08-20 ENCOUNTER — Ambulatory Visit: Payer: Medicare Other | Admitting: Student

## 2023-08-20 NOTE — Patient Instructions (Incomplete)
It was great to see you! Thank you for allowing me to participate in your care!  I recommend that you always bring your medications to each appointment as this makes it easy to ensure we are on the correct medications and helps [name] not miss when refills are needed.  Our plans for today:  - Diabetes  Basaglar 10 units in the morning Humalog 4 to 6 units with meals Metformin 1000 mg twice daily Ozempic 2 mg weekly  We are checking some labs today, I will call you if they are abnormal will send you a MyChart message or a letter if they are normal.  If you do not hear about your labs in the next 2 weeks please let us know.***  Take care and seek immediate care sooner if you develop any concerns.   Dr. Bess Kinds, MD [facility]

## 2023-08-20 NOTE — Progress Notes (Deleted)
  SUBJECTIVE:   CHIEF COMPLAINT / HPI:   DM2 Meds: Basaglar 10 units in the morning, Humalog 4 to 6 units with meals, Metformin 1000 mg twice daily, Ozempic 2 mg weekly  CBG range  PERTINENT  PMH / PSH: ***  Past Medical History:  Diagnosis Date   Anemia    Arthritis    back and knees   ARTHRITIS, KNEE 09/17/2007   Asthma 04/04/2010   pt states she does not have asthma   Cataract 01/07/2019   Closed fracture of distal end of left radius 11/01/2015   Complete deafness    Meningitis at age 46   Deaf    Diabetes mellitus    Diabetic neuropathy (HCC)    Ganglion cyst 06/22/2011   Gastroparesis    GERD (gastroesophageal reflux disease)    H/O: C-section    Hyperlipidemia    Hypertension    Neuromuscular disorder (HCC)    diabetic neuropathy   PSVT (paroxysmal supraventricular tachycardia) (HCC) 11/09/2017   Event monitor 09/14/2017 - Predominantly sinus rhythm with episodes of narrow-complex tachycardia suggestive of paroxysmal supraventricular tachycardia.   S/P appy    OBJECTIVE:  There were no vitals taken for this visit. Physical Exam   ASSESSMENT/PLAN:   Assessment & Plan  No follow-ups on file. Bess Kinds, MD 08/20/2023, 6:57 AM PGY-***, [facility] {    This will disappear when note is signed, click to select method of visit    :1}

## 2023-08-21 ENCOUNTER — Telehealth: Payer: Self-pay

## 2023-08-21 ENCOUNTER — Ambulatory Visit: Payer: Medicare Other | Attending: Physician Assistant | Admitting: Physician Assistant

## 2023-08-21 ENCOUNTER — Encounter: Payer: Self-pay | Admitting: Physician Assistant

## 2023-08-21 VITALS — BP 120/80 | HR 92 | Ht 64.0 in | Wt 149.2 lb

## 2023-08-21 DIAGNOSIS — E11311 Type 2 diabetes mellitus with unspecified diabetic retinopathy with macular edema: Secondary | ICD-10-CM

## 2023-08-21 DIAGNOSIS — Z01818 Encounter for other preprocedural examination: Secondary | ICD-10-CM | POA: Diagnosis not present

## 2023-08-21 DIAGNOSIS — I471 Supraventricular tachycardia, unspecified: Secondary | ICD-10-CM

## 2023-08-21 DIAGNOSIS — I10 Essential (primary) hypertension: Secondary | ICD-10-CM | POA: Diagnosis not present

## 2023-08-21 DIAGNOSIS — E785 Hyperlipidemia, unspecified: Secondary | ICD-10-CM

## 2023-08-21 DIAGNOSIS — G4733 Obstructive sleep apnea (adult) (pediatric): Secondary | ICD-10-CM

## 2023-08-21 NOTE — Patient Instructions (Signed)
 Medication Instructions:  Your physician recommends that you continue on your current medications as directed. Please refer to the Current Medication list given to you today.  *If you need a refill on your cardiac medications before your next appointment, please call your pharmacy*   Lab Work: TODAY:  CMET, CBC, & TSH   If you have labs (blood work) drawn today and your tests are completely normal, you will receive your results only by: MyChart Message (if you have MyChart) OR A paper copy in the mail If you have any lab test that is abnormal or we need to change your treatment, we will call you to review the results.   Testing/Procedures: Your physician has recommended that you have a sleep study. This test records several body functions during sleep, including: brain activity, eye movement, oxygen and carbon dioxide blood levels, heart rate and rhythm, breathing rate and rhythm, the flow of air through your mouth and nose, snoring, body muscle movements, and chest and belly movement.    Follow-Up: At [facility], you and your health needs are our priority.  As part of our continuing mission to provide you with exceptional heart care, we have created designated Provider Care Teams.  These Care Teams include your primary Cardiologist (physician) and Advanced Practice Providers (APPs -  Physician Assistants and Nurse Practitioners) who all work together to provide you with the care you need, when you need it.  We recommend signing up for the patient portal called "MyChart".  Sign up information is provided on this After Visit Summary.  MyChart is used to connect with patients for Virtual Visits (Telemedicine).  Patients are able to view lab/test results, encounter notes, upcoming appointments, etc.  Non-urgent messages can be sent to your provider as well.   To learn more about what you can do with MyChart, go to [email or web address].    Your next appointment:   6  month(s)  Provider:   You will see one of the following Advanced Practice Providers on your designated Care Team:   Francis Dowse, Charlott Holler [address]" [place], [place] Sherie Don, NP Canary Brim, NP      Other Instructions

## 2023-08-21 NOTE — Telephone Encounter (Signed)
**Note De-Identified Lyly Canizales Obfuscation** Per the [employer] Provider Portal:  Member Details Alicia [name], Alicia [name] Date of Birth: October 28, 1955   [id number] - Sleep monitoring of patient (6 years or older) in sleep lab with continued pressured respiratory assistance by mask or breathing tube  Code  63016 Sleep monitoring of patient (6 years or older) in sleep lab with continued pressured respiratory assistance by mask or breathing tube  Pre-Authorization is not Required Sleep Management  I have forwarded the Split Night Sleep Study order to the sleep lab so they can contact the pt to schedule test.

## 2023-08-22 LAB — COMPREHENSIVE METABOLIC PANEL
ALT: 11 [IU]/L (ref 0–32)
AST: 12 [IU]/L (ref 0–40)
Albumin: 4.4 g/dL (ref 3.9–4.9)
Alkaline Phosphatase: 93 [IU]/L (ref 44–121)
BUN/Creatinine Ratio: 22 (ref 12–28)
BUN: 18 mg/dL (ref 8–27)
Bilirubin Total: <0.2 mg/dL (ref 0.0–1.2)
CO2: 27 mmol/L (ref 20–29)
Calcium: 10.1 mg/dL (ref 8.7–10.3)
Chloride: 100 mmol/L (ref 96–106)
Creatinine, Ser: 0.81 mg/dL (ref 0.57–1.00)
Globulin, Total: 2.5 g/dL (ref 1.5–4.5)
Glucose: 125 mg/dL — ABNORMAL HIGH (ref 70–99)
Potassium: 3.9 mmol/L (ref 3.5–5.2)
Sodium: 141 mmol/L (ref 134–144)
Total Protein: 6.9 g/dL (ref 6.0–8.5)
eGFR: 80 mL/min/{1.73_m2} (ref >59)

## 2023-08-22 LAB — CBC
Hematocrit: 35.8 % (ref 34.0–46.6)
Hemoglobin: 11.9 g/dL (ref 11.1–15.9)
MCH: 29.6 pg (ref 26.6–33.0)
MCHC: 33.2 g/dL (ref 31.5–35.7)
MCV: 89 fL (ref 79–97)
Platelets: 352 10*3/uL (ref 150–450)
RBC: 4.02 x10E6/uL (ref 3.77–5.28)
RDW: 13.3 % (ref 11.7–15.4)
WBC: 7.9 10*3/uL (ref 3.4–10.8)

## 2023-08-22 LAB — TSH: TSH: 2.03 u[IU]/mL (ref 0.450–4.500)

## 2023-08-23 ENCOUNTER — Other Ambulatory Visit: Payer: Self-pay | Admitting: Student

## 2023-08-23 DIAGNOSIS — E119 Type 2 diabetes mellitus without complications: Secondary | ICD-10-CM

## 2023-09-01 DIAGNOSIS — E119 Type 2 diabetes mellitus without complications: Secondary | ICD-10-CM | POA: Diagnosis not present

## 2023-09-03 ENCOUNTER — Other Ambulatory Visit: Payer: Self-pay | Admitting: Student

## 2023-09-04 ENCOUNTER — Ambulatory Visit: Payer: Medicare Other | Admitting: Student

## 2023-09-04 NOTE — Progress Notes (Deleted)
  SUBJECTIVE:   CHIEF COMPLAINT / HPI:   Diabetes F/u Diabetes Regimen: - Basaglar 8 units in the morning - Humalog 4 to 6 units with meals - Metformin 1000 mg twice daily - Ozempic 2 mg weekly  PERTINENT  PMH / PSH: ***  Past Medical History:  Diagnosis Date   Anemia    Arthritis    back and knees   ARTHRITIS, KNEE 09/17/2007   Asthma 04/04/2010   pt states she does not have asthma   Cataract 01/07/2019   Closed fracture of distal end of left radius 11/01/2015   Complete deafness    Meningitis at age 77   Deaf    Diabetes mellitus    Diabetic neuropathy (HCC)    Ganglion cyst 06/22/2011   Gastroparesis    GERD (gastroesophageal reflux disease)    H/O: C-section    Hyperlipidemia    Hypertension    Neuromuscular disorder (HCC)    diabetic neuropathy   PSVT (paroxysmal supraventricular tachycardia) (HCC) 11/09/2017   Event monitor 09/14/2017 - Predominantly sinus rhythm with episodes of narrow-complex tachycardia suggestive of paroxysmal supraventricular tachycardia.   S/P appy    OBJECTIVE:  There were no vitals taken for this visit. ***  ASSESSMENT/PLAN:   Assessment & Plan  No follow-ups on file. Bess Kinds, MD 09/04/2023, 6:21 AM PGY-***, [facility] {    This will disappear when note is signed, click to select method of visit    :1}

## 2023-09-10 ENCOUNTER — Ambulatory Visit (INDEPENDENT_AMBULATORY_CARE_PROVIDER_SITE_OTHER): Admitting: Student

## 2023-09-10 ENCOUNTER — Encounter: Payer: Self-pay | Admitting: Student

## 2023-09-10 VITALS — BP 139/67 | HR 91 | Ht 64.0 in | Wt 152.2 lb

## 2023-09-10 DIAGNOSIS — Z794 Long term (current) use of insulin: Secondary | ICD-10-CM

## 2023-09-10 DIAGNOSIS — E11311 Type 2 diabetes mellitus with unspecified diabetic retinopathy with macular edema: Secondary | ICD-10-CM | POA: Diagnosis not present

## 2023-09-10 DIAGNOSIS — E1169 Type 2 diabetes mellitus with other specified complication: Secondary | ICD-10-CM

## 2023-09-10 LAB — POCT GLYCOSYLATED HEMOGLOBIN (HGB A1C): HbA1c, POC (controlled diabetic range): 8.1 % — AB (ref 0.0–7.0)

## 2023-09-10 NOTE — Assessment & Plan Note (Addendum)
 Patient comes in for follow-up of her diabetes.  Patient reports things are going so-so, with spikes in sugars after eating some fruit, but reports she tries to avoid sugars in her diet normally.  Fasting sugars usually around 100-120, however still continues to have episodes of hypoglycemia getting as low as in the 40s.  Patient reports that she forgets to eat lunch, and sometimes dinner, making her sugars get too low.  Patient has tried using alarm on phone to remember to eat, but forgets what the alarm is for when it was off.  Will recommend decreasing dose of long-acting, with close follow-up.  Patient approved for shoulder surgery, by cardiology, but may need forms completed by PCP (me) prior to. - Decrease Basaglar to 8 units in the morning - Humalog 4 to 6 units with meals - Metformin 1000 mg twice daily - Ozempic 2 mg weekly - Eat regularly - Follow-up 1 month - Consider SGLT2 in the future

## 2023-09-10 NOTE — Assessment & Plan Note (Signed)
>>  ASSESSMENT AND PLAN FOR DIABETES (HCC) WRITTEN ON 09/10/2023 12:42 PM BY JENNELLE RIIS, MD  Patient comes in for follow-up of her diabetes.  Patient reports things are going so-so, with spikes in sugars after eating some fruit, but reports she tries to avoid sugars in her diet normally.  Fasting sugars usually around 100-120, however still continues to have episodes of hypoglycemia getting as low as in the 40s.  Patient reports that she forgets to eat lunch, and sometimes dinner, making her sugars get too low.  Patient has tried using alarm on phone to remember to eat, but forgets what the alarm is for when it was off.  Will recommend decreasing dose of long-acting, with close follow-up.  Patient approved for shoulder surgery, by cardiology, but may need forms completed by PCP (me) prior to. - Decrease Basaglar  to 8 units in the morning - Humalog  4 to 6 units with meals - Metformin  1000 mg twice daily - Ozempic  2 mg weekly - Eat regularly - Follow-up 1 month - Consider SGLT2 in the future

## 2023-09-10 NOTE — Progress Notes (Signed)
  SUBJECTIVE:   CHIEF COMPLAINT / HPI:   DM Meds:Basaglar 8 units in the morning, Humalog 4 to 6 units with meals, Metformin 1000 mg twice daily, Ozempic 2 mg weekly  Today: Is taking 10 units of basalgar, 4-6 units of humalog w/ meals, metformin and ozempic. Things are going so so. CBG still spike w/ fruit.  Fasting CBG's around 110-120.  Still having episodes of hypoglycemia x 3 since last visit getting down to 40's.  Still continues to miss meals regularly. Usually missing lunch.   PERTINENT  PMH / PSH:    OBJECTIVE:  BP 139/67   Pulse 91   Ht 5\' 4"  (1.626 m)   Wt 152 lb 3.2 oz (69 kg)   SpO2 100%   BMI 26.13 kg/m  Physical Exam Constitutional:      General: She is not in acute distress.    Appearance: Normal appearance. She is not ill-appearing.  Cardiovascular:     Rate and Rhythm: Normal rate and regular rhythm.     Pulses: Normal pulses.     Heart sounds: Normal heart sounds. No murmur heard.    No friction rub. No gallop.  Pulmonary:     Effort: Pulmonary effort is normal. No respiratory distress.     Breath sounds: Normal breath sounds. No stridor. No wheezing, rhonchi or rales.  Abdominal:     General: Abdomen is flat. There is no distension.     Palpations: Abdomen is soft. There is no mass.     Tenderness: There is no abdominal tenderness. There is no guarding or rebound.     Hernia: No hernia is present.  Neurological:     Mental Status: She is alert.  Psychiatric:        Mood and Affect: Mood normal.        Behavior: Behavior normal.      ASSESSMENT/PLAN:   Assessment & Plan Type 2 diabetes mellitus with other specified complication, with long-term current use of insulin (HCC) Patient comes in for follow-up of her diabetes.  Patient reports things are going so-so, with spikes in sugars after eating some fruit, but reports she tries to avoid sugars in her diet normally.  Fasting sugars usually around 100-120, however still continues to have episodes  of hypoglycemia getting as low as in the 40s.  Patient reports that she forgets to eat lunch, and sometimes dinner, making her sugars get too low.  Patient has tried using alarm on phone to remember to eat, but forgets what the alarm is for when it was off.  Will recommend decreasing dose of long-acting, with close follow-up.  Patient approved for shoulder surgery, by cardiology, but may need forms completed by PCP (me) prior to. - Decrease Basaglar to 8 units in the morning - Humalog 4 to 6 units with meals - Metformin 1000 mg twice daily - Ozempic 2 mg weekly - Eat regularly - Follow-up 1 month - Consider SGLT2 in the future No follow-ups on file. Bess Kinds, MD 09/10/2023, 9:37 AM PGY-3, [facility]

## 2023-09-10 NOTE — Patient Instructions (Addendum)
 It was great to see you! Thank you for allowing me to participate in your care!  I recommend that you always bring your medications to each appointment as this makes it easy to ensure we are on the correct medications and helps [name] not miss when refills are needed.  Our plans for today:  - Diabetes  Basaglar 8 units in the morning Humalog 4 to 6 units with meals Metformin 1000 mg twice daily Ozempic 2 mg weekly Set alarm on phone to remember to eat meals (and be sure to EAT!) We can't increase the dose of your insulin, because you continue to get hypoglycemic   Follow up in 1 month  We are checking some labs today, I will call you if they are abnormal will send you a MyChart message or a letter if they are normal.  If you do not hear about your labs in the next 2 weeks please let us know.  Take care and seek immediate care sooner if you develop any concerns.   Dr. Bess Kinds, MD [facility]

## 2023-09-17 ENCOUNTER — Ambulatory Visit: Payer: Medicare Other | Admitting: Podiatry

## 2023-09-18 ENCOUNTER — Encounter: Payer: Self-pay | Admitting: Podiatry

## 2023-09-18 ENCOUNTER — Ambulatory Visit: Admitting: Podiatry

## 2023-09-18 VITALS — Ht 64.0 in | Wt 152.0 lb

## 2023-09-18 DIAGNOSIS — B351 Tinea unguium: Secondary | ICD-10-CM

## 2023-09-18 DIAGNOSIS — E1149 Type 2 diabetes mellitus with other diabetic neurological complication: Secondary | ICD-10-CM

## 2023-09-18 DIAGNOSIS — E119 Type 2 diabetes mellitus without complications: Secondary | ICD-10-CM

## 2023-09-23 NOTE — Progress Notes (Signed)
  Subjective:  Patient ID: Alicia [name], female    DOB: 1956/04/05,  MRN: [id number]  Chief Complaint  Patient presents with   [facility]    "She is here for a diabetic nail trim and feels they are really long and I also have been having some numbness and tingling in the top of my feet and using nervive for the pain"     68 y.o. female presents with the above complaint. History confirmed with patient.  She is hearing impaired and is here with an interpreter today.  She utilizes gabapentin does not seem to help much with the numbness  Objective:  Physical Exam: warm, good capillary refill, no trophic changes or ulcerative lesions, normal DP and PT pulses and abnormal sensory exam.  Thickened elongated yellow dystrophic toenails x 10  Assessment:   1. Dermatophytosis of nail   2. Type II diabetes mellitus with neurological manifestations (HCC)   3. Encounter for diabetic foot exam [facility])      Plan:  Patient was evaluated and treated and all questions answered.  Patient educated on diabetes. Discussed proper diabetic foot care and discussed risks and complications of disease. Educated patient in depth on reasons to return to the office immediately should he/she discover anything concerning or new on the feet. All questions answered. Discussed proper shoes as well.  Discussed the impact of neuropathy and role of A1c and controlling this.  Discussed gabapentin likely will help with pain but not much with numbness.  She could consider trialing Lyrica with her PCP and will discuss with them.   Discussed the etiology and treatment options for the condition in detail with the patient.   Recommended debridement of the nails today. Sharp and mechanical debridement performed of all painful and mycotic nails today. Nails debrided in length and thickness using a nail nipper to level of comfort.      Return if symptoms worsen or fail to improve.

## 2023-09-24 ENCOUNTER — Other Ambulatory Visit: Payer: Self-pay | Admitting: Student

## 2023-10-02 DIAGNOSIS — E119 Type 2 diabetes mellitus without complications: Secondary | ICD-10-CM | POA: Diagnosis not present

## 2023-10-05 ENCOUNTER — Other Ambulatory Visit: Payer: Self-pay | Admitting: Podiatry

## 2023-10-10 ENCOUNTER — Encounter: Payer: Self-pay | Admitting: Student

## 2023-10-10 ENCOUNTER — Ambulatory Visit: Payer: Self-pay | Admitting: Student

## 2023-10-10 VITALS — BP 132/71 | HR 83 | Ht 64.0 in | Wt 148.8 lb

## 2023-10-10 DIAGNOSIS — Z794 Long term (current) use of insulin: Secondary | ICD-10-CM | POA: Diagnosis not present

## 2023-10-10 DIAGNOSIS — E1169 Type 2 diabetes mellitus with other specified complication: Secondary | ICD-10-CM | POA: Diagnosis not present

## 2023-10-10 NOTE — Progress Notes (Signed)
  SUBJECTIVE:   CHIEF COMPLAINT / HPI:   Diabetes Meds:Basaglar to 8 units in the morning, Humalog 4 to 6 units with meals, Metformin 1000 mg twice daily, Ozempic 2 mg weekly  Today Fasting sugars ranging from 100- 150 Taking 8 units basaglar, humalog 4-6, metformin 1000 mg BID, Ozempic 2 mg weekly.  Had 2-3 episode of hypoglycemia since last visit. Usually misses lunch, but get's dinner and breakfast. She mentions improved adherence to meal times, with her children reminding her to eat, although she still occasionally forgets lunch. She always eats breakfast and dinner due to medication requirements  PERTINENT  PMH / PSH:   OBJECTIVE:  BP 132/71   Pulse 83   Ht 5\' 4"  (1.626 m)   Wt 148 lb 12.8 oz (67.5 kg)   SpO2 99%   BMI 25.54 kg/m  Physical Exam Constitutional:      General: She is not in acute distress.    Appearance: Normal appearance. She is not ill-appearing.  Cardiovascular:     Rate and Rhythm: Normal rate and regular rhythm.     Pulses: Normal pulses.     Heart sounds: Normal heart sounds. No murmur heard.    No friction rub. No gallop.  Pulmonary:     Effort: Pulmonary effort is normal. No respiratory distress.     Breath sounds: Normal breath sounds. No stridor. No wheezing, rhonchi or rales.  Abdominal:     General: There is no distension.     Palpations: Abdomen is soft. There is no mass.     Tenderness: There is no abdominal tenderness. There is no guarding or rebound.     Hernia: No hernia is present.  Neurological:     Mental Status: She is alert.  Psychiatric:        Mood and Affect: Mood normal.        Behavior: Behavior normal.      ASSESSMENT/PLAN:   Assessment & Plan Type 2 diabetes mellitus with other specified complication, with long-term current use of insulin (HCC) Patient comes in for follow-up of her diabetes.  Patient reports she has been doing well, good compliance with medications.  Patient reports 2-3 episodes of hypoglycemia since  last visit, related to forgetting to eat lunch.  Patient demonstrates that a better job of remembering meals.  Fasting sugars ranging around 100.  Seems like we can lower insulin dose given fasting sugar and hypoglycemia.  Last A1c 8.1 last month. - Basaglar 6 units daily - Ozempic 2 mg weekly - Metformin 1000 mg twice daily - Humalog 4 to 6 units with meals - Follow-up 2 months No follow-ups on file. Bess Kinds, MD 10/10/2023, 9:06 AM PGY-3, [facility]

## 2023-10-10 NOTE — Assessment & Plan Note (Signed)
>>  ASSESSMENT AND PLAN FOR DIABETES (HCC) WRITTEN ON 10/10/2023  9:24 AM BY JENNELLE RIIS, MD  Patient comes in for follow-up of her diabetes.  Patient reports she has been doing well, good compliance with medications.  Patient reports 2-3 episodes of hypoglycemia since last visit, related to forgetting to eat lunch.  Patient demonstrates that a better job of remembering meals.  Fasting sugars ranging around 100.  Seems like we can lower insulin  dose given fasting sugar and hypoglycemia.  Last A1c 8.1 last month. - Basaglar  6 units daily - Ozempic  2 mg weekly - Metformin  1000 mg twice daily - Humalog  4 to 6 units with meals - Follow-up 2 months

## 2023-10-10 NOTE — Patient Instructions (Signed)
 It was great to see you! Thank you for allowing me to participate in your care!   Our plans for today:  - Diabetes - Decrease Basaglar to 6 units in the morning - Humalog 4 to 6 units with meals - Metformin 1000 mg twice daily - Ozempic 2 mg weekly - Follow up in 2 months - Be sure to eat lunch/not miss meals. Keep snacks on you incase you forget to get lunch.   Take care and seek immediate care sooner if you develop any concerns.   Dr. Bess Kinds, MD [facility]

## 2023-10-10 NOTE — Assessment & Plan Note (Addendum)
 Patient comes in for follow-up of her diabetes.  Patient reports she has been doing well, good compliance with medications.  Patient reports 2-3 episodes of hypoglycemia since last visit, related to forgetting to eat lunch.  Patient demonstrates that a better job of remembering meals.  Fasting sugars ranging around 100.  Seems like we can lower insulin dose given fasting sugar and hypoglycemia.  Last A1c 8.1 last month. - Basaglar 6 units daily - Ozempic 2 mg weekly - Metformin 1000 mg twice daily - Humalog 4 to 6 units with meals - Follow-up 2 months

## 2023-10-18 ENCOUNTER — Encounter: Payer: Self-pay | Admitting: Pharmacist

## 2023-10-25 ENCOUNTER — Encounter: Payer: Self-pay | Admitting: Pharmacist

## 2023-10-25 ENCOUNTER — Other Ambulatory Visit (HOSPITAL_COMMUNITY): Payer: Self-pay

## 2023-10-25 ENCOUNTER — Ambulatory Visit (INDEPENDENT_AMBULATORY_CARE_PROVIDER_SITE_OTHER): Admitting: Pharmacist

## 2023-10-25 VITALS — BP 136/73 | HR 79 | Wt 150.2 lb

## 2023-10-25 DIAGNOSIS — Z794 Long term (current) use of insulin: Secondary | ICD-10-CM | POA: Diagnosis not present

## 2023-10-25 DIAGNOSIS — E1169 Type 2 diabetes mellitus with other specified complication: Secondary | ICD-10-CM | POA: Diagnosis not present

## 2023-10-25 MED ORDER — FREESTYLE LIBRE 3 PLUS SENSOR MISC: 11 refills | Status: DC

## 2023-10-25 NOTE — Progress Notes (Signed)
 S:     Chief Complaint  Patient presents with   Medication Management    Diabetes follow-up - hypoglycemia concerns   68 y.o. female who presents for diabetes evaluation, education, and management. Patient arrives in good spirits and presents without any assistance. Patient is accompanied by sign-language interpreter, Avanell Bob.   Patient was referred and last seen by Primary Care Provider, Dr. Tenna Fees, on 10/10/23.   PMH is significant for T2DM, diabetic polyneuropathy, retinopathy, HTN, HLD, OSA.  At last visit, patient reported 2-3 hypoglycemic events and encouraged to eat more consistently throughout the day.   Current diabetes medications include: Lantus  (insulin  glargine) 8-10 units daily at bedtime, Humalog  (insulin  lispro) 4-6 units 1-3x daily with meals, Ozempic  (semaglutide ) 2 mg weekly, metformin  2000 mg Current hypertension medications include: carvedilol  50 mg, diltiazem  CD 240 mg, hydrochlorothiazide  12.5 mg, valsartan 160 mg Current hyperlipidemia medications include: rosuvastatin  5 mg 2x weekly  Patient reports adherence to taking all medications as prescribed.   Patient reports hypoglycemic events, occurring 2-3x per month. Reports mild headache and dizziness while experiencing hypoglycemic events.  Reported home fasting blood sugars: 95 this morning (4/24), lowest 35 (2 weeks ago, ~4/10) Reported 2 hour post-meal/random blood sugars: 135 last night (4/23).  Reports intermittent jaw tightness that has been occurring since surgery.  O:   Review of Systems  All other systems reviewed and are negative.   Physical Exam Vitals reviewed.  Constitutional:      Appearance: Normal appearance.  Pulmonary:     Effort: Pulmonary effort is normal.  Neurological:     Mental Status: She is alert.  Psychiatric:        Mood and Affect: Mood normal.        Behavior: Behavior normal.        Thought Content: Thought content normal.        Judgment: Judgment normal.     Lab  Results  Component Value Date   HGBA1C 8.1 (A) 09/10/2023   Vitals:   10/25/23 0935  BP: 136/73  Pulse: 79  SpO2: 99%    Lipid Panel     Component Value Date/Time   CHOL 198 02/22/2023 0826   TRIG 78 02/22/2023 0826   HDL 94 02/22/2023 0826   CHOLHDL 2.1 02/22/2023 0826   CHOLHDL 2.3 07/28/2016 0936   VLDL 12 07/28/2016 0936   LDLCALC 90 02/22/2023 0826   LDLDIRECT 78 07/10/2013 1441    Clinical Atherosclerotic Cardiovascular Disease (ASCVD): No  The 10-year ASCVD risk score (Arnett DK, et al., 2019) is: 24.2%   Values used to calculate the score:     Age: 51 years     Sex: Female     Is Non-Hispanic African American: Yes     Diabetic: Yes     Tobacco smoker: No     Systolic Blood Pressure: 136 mmHg     Is BP treated: Yes     HDL Cholesterol: 94 mg/dL     Total Cholesterol: 198 mg/dL   A/P: Diabetes longstanding 17 years (2008) currently uncontrolled. Patient is able to verbalize appropriate hypoglycemia management plan. Medication adherence appears good. Control is suboptimal due to multiple hypoglycemic events (2-3 events) that patient reports over the course of the month.  -Majority of appointment spent educating on purpose, proper use, and application of Libre3 sensor on patient. Applied sensor today. Unable to download app on patient's phone during appointment. Plan for patient's daughter to connect [place] sensor to app and practice for data  sharing at home. -Continued basal insulin  Lantus  (insulin  glargine) 8-10 units daily at bedtime. -Consider counseling patient on moving Lantus  dosing time from bedtime to morning dependent on if she experiences hypoglycemic events overnight. -Continued rapid insulin  Humalog  (insulin  lispro) 4-6 units 1-3x daily with meals.  -Continued GLP-1 Ozempic  (semaglutide ) 2 mg weekly.  -Continued metformin  2000 mg.  -Patient educated on purpose, proper use, and potential adverse effects.  -Extensively discussed pathophysiology of  diabetes, recommended lifestyle interventions, dietary effects on blood sugar control.  -Counseled on s/sx of and management of hypoglycemia.  -Next A1c anticipated June 2025.   Written patient instructions provided. Patient verbalized understanding of treatment plan.  Total time in face to face counseling 46 minutes.    Follow-up:  Pharmacist 11/08/23 PCP clinic visit in [place] Patient seen with Teofilo Fellers, PharmD Candidate and Jeanine Millers, PharmD Candidate.

## 2023-10-25 NOTE — Progress Notes (Signed)
 Reviewed and agree with Dr Macky Lower plan.

## 2023-10-25 NOTE — Assessment & Plan Note (Signed)
 Diabetes longstanding 17 years (2008) currently uncontrolled. Patient is able to verbalize appropriate hypoglycemia management plan. Medication adherence appears good. Control is suboptimal due to multiple hypoglycemic events (2-3 events) that patient reports over the course of the month.  -Majority of appointment spent educating on purpose, proper use, and application of Libre3 sensor on patient. Applied sensor today. Unable to download app on patient's phone during appointment. Plan for patient's daughter to connect [place] sensor to app and practice for data sharing at home. -Continued basal insulin  Lantus  (insulin  glargine) 8-10 units daily at bedtime. -Consider counseling patient on moving Lantus  dosing time from bedtime to morning dependent on if she experiences hypoglycemic events overnight. -Continued rapid insulin  Humalog  (insulin  lispro) 4-6 units 1-3x daily with meals.  -Continued GLP-1 Ozempic  (semaglutide ) 2 mg weekly.  -Continued metformin  2000 mg.  -Patient educated on purpose, proper use, and potential adverse effects.  -Extensively discussed pathophysiology of diabetes, recommended lifestyle interventions, dietary effects on blood sugar control.  -Counseled on s/sx of and management of hypoglycemia.

## 2023-10-25 NOTE — Patient Instructions (Addendum)
It was nice to see you today!  Your goal blood sugar is 80-130 before eating and less than 180 after eating.  Medication Changes: Continue all other medication the same.   Keep up the good work with diet and exercise. Aim for a diet full of vegetables, fruit and lean meats (chicken, [place], fish). Try to limit salt intake by eating fresh or frozen vegetables (instead of canned), rinse canned vegetables prior to cooking and do not add any additional salt to meals.    Sensor Application If using the App, you can tap Help in the Main Menu to access an in-app tutorial on applying a Sensor. See below for instructions on how to download the app. Apply Sensors only on the back of your upper arm. If placed in other areas, the Sensor may not function properly and could give you inaccurate readings. Avoid areas with scars, moles, stretch marks, or lumps.   Select an area of skin that generally stays flat during your normal daily activities (no bending or folding). Choose a site that is at least 1 inch (2.5 cm) away from any injection sites. To prevent discomfort or skin irritation, you should select a different site other than the one most recently used. Wash application site using a plain soap, dry, and then clean with an alcohol wipe. This will help remove any oily residue that may prevent the sensor from sticking properly. Allow site to air dry before proceeding. Note: The area MUST be clean and dry, or the Sensor may not stay on for the full wear duration specified by your Sensor insert. 4. Unscrew the cap from the Sensor Applicator and set the cap aside.  5. Place the Sensor Applicator over the prepared site and push down firmly to apply the Sensor to your body. 6. Gently pull the Sensor Applicator away from your body. The Sensor should now be attached to your skin. 7. Make sure the Sensor is secure after application. Put the cap back on the Sensor Applicator. Discard the used Engineer, agricultural  according to local regulations.  What If My Sensor Falls Off or What If My Sensor Isn't Working? Call [employer] Customer Care Team at [phone] Available 7 days a week from 8AM-8PM EST, excluding holidays If yo have multiple sensors fall off prior to 14 days of use, contact [facility] at [phone]   The App Download the FreeStyle [place] [employer] App in your phone's app store   Load the app and select get started now Create an account  Tap scan new sensor Follow the prompts on the screen. If your sensor does not sync, try moving your phone slowly around the sensor. Phone cases may affect scanning. This will be the only time you have to scan the sensor until you apply a new sensor in 14 days.  There will be a 60 minute start up period until the app will display your glucose reading   How To Share Your Readings With [name] Once in the app, go to settings -> connected apps -> LibreView -> Enter Practice ID -> [id number]

## 2023-10-25 NOTE — Assessment & Plan Note (Signed)
>>  ASSESSMENT AND PLAN FOR DIABETES ([facility]CC) WRITTEN ON 10/25/2023 10:20 AM BY KOVAL, PETER G, RPH-CPP  Diabetes longstanding 17 years (2008) currently uncontrolled. Patient is able to verbalize appropriate hypoglycemia management plan. Medication adherence appears good. Control is suboptimal due to multiple hypoglycemic events (2-3 events) that patient reports over the course of the month.  -Majority of appointment spent educating on purpose, proper use, and application of Libre3 sensor on patient. Applied sensor today. Unable to download app on patient's phone during appointment. Plan for patient's daughter to connect [employer] sensor to app and practice for data sharing at home. -Continued basal insulin  Lantus  (insulin  glargine) 8-10 units daily at bedtime. -Consider counseling patient on moving Lantus  dosing time from bedtime to morning dependent on if she experiences hypoglycemic events overnight. -Continued rapid insulin  Humalog  (insulin  lispro) 4-6 units 1-3x daily with meals.  -Continued GLP-1 Ozempic  (semaglutide ) 2 mg weekly.  -Continued metformin  2000 mg.  -Patient educated on purpose, proper use, and potential adverse effects.  -Extensively discussed pathophysiology of diabetes, recommended lifestyle interventions, dietary effects on blood sugar control.  -Counseled on s/sx of and management of hypoglycemia.

## 2023-10-30 NOTE — Patient Instructions (Signed)
 DUE TO COVID-19 ONLY TWO VISITORS  (aged 68 and older)  ARE ALLOWED TO COME WITH YOU AND STAY IN THE WAITING ROOM ONLY DURING PRE OP AND PROCEDURE.   **NO VISITORS ARE ALLOWED IN THE SHORT STAY AREA OR RECOVERY ROOM!!**  IF YOU WILL BE ADMITTED INTO THE HOSPITAL YOU ARE ALLOWED ONLY FOUR SUPPORT PEOPLE DURING VISITATION HOURS ONLY (7 AM -8PM)   The support person(s) must pass our screening, gel in and out, and wear a mask at all times, including in the patient's room. Patients must also wear a mask when staff or their support person are in the room. Visitors GUEST BADGE MUST BE WORN VISIBLY  One adult visitor may remain with you overnight and MUST be in the room by 8 P.M.     Your procedure is scheduled on: 11/09/23   Report to [facility] Main Entrance    Report to admitting at : 5:15 AM   Call this number if you have problems the morning of surgery [phone]   Do not eat food :After Midnight.   After Midnight you may have the following liquids until : 4:15 AM DAY OF SURGERY  Water Black Coffee (sugar ok, NO MILK/CREAM OR CREAMERS)  Tea (sugar ok, NO MILK/CREAM OR CREAMERS) regular and decaf                             Plain Jell-O (NO RED)                                           Fruit ices (not with fruit pulp, NO RED)                                     Popsicles (NO RED)                                                                  Juice: apple, WHITE grape, WHITE cranberry Sports drinks like Gatorade (NO RED)   The day of surgery:  Drink ONE (1) Pre-Surgery Clear G2 at : 4:15 AM the morning of surgery. Drink in one sitting. Do not sip.  This drink was given to you during your hospital  pre-op appointment visit. Nothing else to drink after completing the  Pre-Surgery Clear Ensure or G2.          If you have questions, please contact your surgeon's office.  FOLLOW ANY ADDITIONAL PRE OP INSTRUCTIONS YOU RECEIVED FROM YOUR SURGEON'S OFFICE!!!   Oral Hygiene  is also important to reduce your risk of infection.                                    Remember - BRUSH YOUR TEETH THE MORNING OF SURGERY WITH YOUR REGULAR TOOTHPASTE  DENTURES WILL BE REMOVED PRIOR TO SURGERY PLEASE DO NOT APPLY "Poly grip" OR ADHESIVES!!!   Do NOT smoke after Midnight   Take these medicines the morning of surgery  with A SIP OF WATER: duloxetine ,carvedilol ,diltiazem ,famotidine ,pantoprazole .Gabapentin  as needed.  STOP TAKING all Vitamins, Herbs and supplements 1 week before your surgery.   How to Manage Your Diabetes Before and After Surgery  Why is it important to control my blood sugar before and after surgery? Improving blood sugar levels before and after surgery helps healing and can limit problems. A way of improving blood sugar control is eating a healthy diet by:  Eating less sugar and carbohydrates  Increasing activity/exercise  Talking with your doctor about reaching your blood sugar goals High blood sugars (greater than 180 mg/dL) can raise your risk of infections and slow your recovery, so you will need to focus on controlling your diabetes during the weeks before surgery. Make sure that the doctor who takes care of your diabetes knows about your planned surgery including the date and location.  How do I manage my blood sugar before surgery? Check your blood sugar at least 4 times a day, starting 2 days before surgery, to make sure that the level is not too high or low. Check your blood sugar the morning of your surgery when you wake up and every 2 hours until you get to the Short Stay unit. If your blood sugar is less than 70 mg/dL, you will need to treat for low blood sugar: Do not take insulin . Treat a low blood sugar (less than 70 mg/dL) with  cup of clear juice (cranberry or apple), 4 glucose tablets, OR glucose gel. Recheck blood sugar in 15 minutes after treatment (to make sure it is greater than 70 mg/dL). If your blood sugar is not greater than 70  mg/dL on recheck, call [phone] for further instructions. Report your blood sugar to the short stay nurse when you get to Short Stay.  If you are admitted to the hospital after surgery: Your blood sugar will be checked by the staff and you will probably be given insulin  after surgery (instead of oral diabetes medicines) to make sure you have good blood sugar levels. The goal for blood sugar control after surgery is 80-180 mg/dL.   WHAT DO I DO ABOUT MY DIABETES MEDICATION?  Do not take oral diabetes medicines (pills) the morning of surgery.  THE NIGHT BEFORE SURGERY, take ONLY half of the lantus  insulin  dose. And DO NOT take lispro insulin  PM dose.     THE MORNING OF SURGERY, DO NOT TAKE ANY ORAL DIABETIC MEDICATIONS DAY OF YOUR SURGERY  If your CBG is greater than 220 mg/dL, you may take  of your sliding scale  (correction) dose of insulin .  DO NOT TAKE THE FOLLOWING 7 DAYS PRIOR TO SURGERY: Ozempic , Wegovy , Rybelsus  (Semaglutide ), Byetta (exenatide), Bydureon (exenatide ER), Victoza , Saxenda  (liraglutide ), or Trulicity  (dulaglutide ) Mounjaro (Tirzepatide) Adlyxin (Lixisenatide), Polyethylene Glycol Loxenatide.HOLD semaglutide  after: 11/01/23  If your CBG is greater than 220 mg/dL, you may take  of your sliding scale  (correction) dose of insulin .                                You may not have any metal on your body including hair pins, jewelry, and body piercing             Do not wear make-up, lotions, powders, perfumes/cologne, or deodorant  Do not wear nail polish including gel and S&S, artificial/acrylic nails, or any other type of covering on natural nails including finger and toenails. If you have artificial nails, gel coating, etc.  that needs to be removed by a nail salon please have this removed prior to surgery or surgery may need to be canceled/ delayed if the surgeon/ anesthesia feels like they are unable to be safely monitored.   Do not shave  48 hours prior to  surgery.    Do not bring valuables to the hospital. [facility] IS NOT             RESPONSIBLE   FOR VALUABLES.   Contacts, glasses, or bridgework may not be worn into surgery.   Bring small overnight bag day of surgery.   DO NOT BRING YOUR HOME MEDICATIONS TO THE HOSPITAL. PHARMACY WILL DISPENSE MEDICATIONS LISTED ON YOUR MEDICATION LIST TO YOU DURING YOUR ADMISSION IN THE HOSPITAL!    Patients discharged on the day of surgery will not be allowed to drive home.  Someone NEEDS to stay with you for the first 24 hours after anesthesia.   Special Instructions: Bring a copy of your healthcare power of attorney and living will documents         the day of surgery if you haven't scanned them before.              Please read over the following fact sheets you were given: IF YOU HAVE QUESTIONS ABOUT YOUR PRE-OP INSTRUCTIONS PLEASE CALL [phone]    [place]- Preparing for Total Shoulder Arthroplasty    Before surgery, you can play an important role. Because skin is not sterile, your skin needs to be as free of germs as possible. You can reduce the number of germs on your skin by using the following products. Benzoyl Peroxide Gel Reduces the number of germs present on the skin Applied twice a day to shoulder area starting two days before surgery    ==================================================================  Please follow these instructions carefully:  BENZOYL PEROXIDE 5% GEL  Please do not use if you have an allergy to benzoyl peroxide.   If your skin becomes reddened/irritated stop using the benzoyl peroxide.  Starting two days before surgery, apply as follows: Apply benzoyl peroxide in the morning and at night. Apply after taking a shower. If you are not taking a shower clean entire shoulder front, back, and side along with the armpit with a clean wet washcloth.  Place a quarter-sized dollop on your shoulder and rub in thoroughly, making sure to cover the front, back, and  side of your shoulder, along with the armpit.   2 days before ____ AM   ____ PM              1 day before ____ AM   ____ PM                         Do this twice a day for two days.  (Last application is the night before surgery, AFTER using the CHG soap as described below).  Do NOT apply benzoyl peroxide gel on the day of surgery.  Pre-operative 5 CHG Bath Instructions   You can play a key role in reducing the risk of infection after surgery. Your skin needs to be as free of germs as possible. You can reduce the number of germs on your skin by washing with CHG (chlorhexidine  gluconate) soap before surgery. CHG is an antiseptic soap that kills germs and continues to kill germs even after washing.   DO NOT use if you have an allergy to chlorhexidine /CHG or antibacterial soaps. If your skin becomes  reddened or irritated, stop using the CHG and notify one of our RNs at [phone].   Please shower with the CHG soap starting 4 days before surgery using the following schedule:     Please keep in mind the following:  DO NOT shave, including legs and underarms, starting the day of your first shower.   You may shave your face at any point before/day of surgery.  Place clean sheets on your bed the day you start using CHG soap. Use a clean washcloth (not used since being washed) for each shower. DO NOT sleep with pets once you start using the CHG.   CHG Shower Instructions:  If you choose to wash your hair and private area, wash first with your normal shampoo/soap.  After you use shampoo/soap, rinse your hair and body thoroughly to remove shampoo/soap residue.  Turn the water OFF and apply about 3 tablespoons (45 ml) of CHG soap to a CLEAN washcloth.  Apply CHG soap ONLY FROM YOUR NECK DOWN TO YOUR TOES (washing for 3-5 minutes)  DO NOT use CHG soap on face, private areas, open wounds, or sores.  Pay special attention to the area where your surgery is being performed.  If you are having back  surgery, having someone wash your back for you may be helpful. Wait 2 minutes after CHG soap is applied, then you may rinse off the CHG soap.  Pat dry with a clean towel  Put on clean clothes/pajamas   If you choose to wear lotion, please use ONLY the CHG-compatible lotions on the back of this paper.     Additional instructions for the day of surgery: DO NOT APPLY any lotions, deodorants, cologne, or perfumes.   Put on clean/comfortable clothes.  Brush your teeth.  Ask your nurse before applying any prescription medications to the skin.   CHG Compatible Lotions   Aveeno Moisturizing lotion  Cetaphil Moisturizing Cream  Cetaphil Moisturizing Lotion  Clairol Herbal Essence Moisturizing Lotion, Dry Skin  Clairol Herbal Essence Moisturizing Lotion, Extra Dry Skin  Clairol Herbal Essence Moisturizing Lotion, Normal Skin  Curel Age Defying Therapeutic Moisturizing Lotion with Alpha Hydroxy  Curel Extreme Care Body Lotion  Curel Soothing Hands Moisturizing Hand Lotion  Curel Therapeutic Moisturizing Cream, Fragrance-Free  Curel Therapeutic Moisturizing Lotion, Fragrance-Free  Curel Therapeutic Moisturizing Lotion, Original Formula  Eucerin Daily Replenishing Lotion  Eucerin Dry Skin Therapy Plus Alpha Hydroxy Crme  Eucerin Dry Skin Therapy Plus Alpha Hydroxy Lotion  Eucerin Original Crme  Eucerin Original Lotion  Eucerin Plus Crme Eucerin Plus Lotion  Eucerin TriLipid Replenishing Lotion  Keri Anti-Bacterial Hand Lotion  Keri Deep Conditioning Original Lotion Dry Skin Formula Softly Scented  Keri Deep Conditioning Original Lotion, Fragrance Free Sensitive Skin Formula  Keri Lotion Fast Absorbing Fragrance Free Sensitive Skin Formula  Keri Lotion Fast Absorbing Softly Scented Dry Skin Formula  Keri Original Lotion  Keri Skin Renewal Lotion Keri Silky Smooth Lotion  Keri Silky Smooth Sensitive Skin Lotion  Nivea Body Creamy Conditioning Oil  Nivea Body Extra Enriched Lotion   Nivea Body Original Lotion  Nivea Body Sheer Moisturizing Lotion Nivea Crme  Nivea Skin Firming Lotion  NutraDerm 30 Skin Lotion  NutraDerm Skin Lotion  NutraDerm Therapeutic Skin Cream  NutraDerm Therapeutic Skin Lotion  ProShield Protective Hand Cream  Provon moisturizing lotion   Incentive Spirometer  An incentive spirometer is a tool that can help keep your lungs clear and active. This tool measures how well you are filling your lungs with each  breath. Taking long deep breaths may help reverse or decrease the chance of developing breathing (pulmonary) problems (especially infection) following: A long period of time when you are unable to move or be active. BEFORE THE PROCEDURE  If the spirometer includes an indicator to show your best effort, your nurse or respiratory therapist will set it to a desired goal. If possible, sit up straight or lean slightly forward. Try not to slouch. Hold the incentive spirometer in an upright position. INSTRUCTIONS FOR USE  Sit on the edge of your bed if possible, or sit up as far as you can in bed or on a chair. Hold the incentive spirometer in an upright position. Breathe out normally. Place the mouthpiece in your mouth and seal your lips tightly around it. Breathe in slowly and as deeply as possible, raising the piston or the ball toward the top of the column. Hold your breath for 3-5 seconds or for as long as possible. Allow the piston or ball to fall to the bottom of the column. Remove the mouthpiece from your mouth and breathe out normally. Rest for a few seconds and repeat Steps 1 through 7 at least 10 times every 1-2 hours when you are awake. Take your time and take a few normal breaths between deep breaths. The spirometer may include an indicator to show your best effort. Use the indicator as a goal to work toward during each repetition. After each set of 10 deep breaths, practice coughing to be sure your lungs are clear. If you have an  incision (the cut made at the time of surgery), support your incision when coughing by placing a pillow or rolled up towels firmly against it. Once you are able to get out of bed, walk around indoors and cough well. You may stop using the incentive spirometer when instructed by your caregiver.  RISKS AND COMPLICATIONS Take your time so you do not get dizzy or light-headed. If you are in pain, you may need to take or ask for pain medication before doing incentive spirometry. It is harder to take a deep breath if you are having pain. AFTER USE Rest and breathe slowly and easily. It can be helpful to keep track of a log of your progress. Your caregiver can provide you with a simple table to help with this. If you are using the spirometer at home, follow these instructions: SEEK MEDICAL CARE IF:  You are having difficultly using the spirometer. You have trouble using the spirometer as often as instructed. Your pain medication is not giving enough relief while using the spirometer. You develop fever of 100.5 F (38.1 C) or higher. SEEK IMMEDIATE MEDICAL CARE IF:  You cough up bloody sputum that had not been present before. You develop fever of 102 F (38.9 C) or greater. You develop worsening pain at or near the incision site. MAKE SURE YOU:  Understand these instructions. Will watch your condition. Will get help right away if you are not doing well or get worse. Document Released: 10/30/2006 Document Revised: 09/11/2011 Document Reviewed: 12/31/2006 [facility] Patient Information 2014 [place], [place].   ________________________________________________________________________

## 2023-10-31 ENCOUNTER — Other Ambulatory Visit: Payer: Self-pay

## 2023-10-31 ENCOUNTER — Encounter (HOSPITAL_COMMUNITY): Payer: Self-pay

## 2023-10-31 ENCOUNTER — Encounter (HOSPITAL_COMMUNITY)
Admission: RE | Admit: 2023-10-31 | Discharge: 2023-10-31 | Disposition: A | Discharge status: Home / Self Care | Source: Ambulatory Visit | Attending: Orthopedic Surgery | Admitting: Orthopedic Surgery

## 2023-10-31 VITALS — BP 134/69 | HR 79 | Temp 97.9°F | Ht 64.0 in | Wt 152.1 lb

## 2023-10-31 DIAGNOSIS — J45909 Unspecified asthma, uncomplicated: Secondary | ICD-10-CM | POA: Insufficient documentation

## 2023-10-31 DIAGNOSIS — Z01812 Encounter for preprocedural laboratory examination: Secondary | ICD-10-CM | POA: Diagnosis not present

## 2023-10-31 DIAGNOSIS — S42402K Unspecified fracture of lower end of left humerus, subsequent encounter for fracture with nonunion: Secondary | ICD-10-CM | POA: Diagnosis not present

## 2023-10-31 DIAGNOSIS — I10 Essential (primary) hypertension: Secondary | ICD-10-CM | POA: Insufficient documentation

## 2023-10-31 DIAGNOSIS — E1143 Type 2 diabetes mellitus with diabetic autonomic (poly)neuropathy: Secondary | ICD-10-CM | POA: Diagnosis not present

## 2023-10-31 DIAGNOSIS — K219 Gastro-esophageal reflux disease without esophagitis: Secondary | ICD-10-CM | POA: Diagnosis not present

## 2023-10-31 DIAGNOSIS — D649 Anemia, unspecified: Secondary | ICD-10-CM | POA: Diagnosis not present

## 2023-10-31 DIAGNOSIS — E11311 Type 2 diabetes mellitus with unspecified diabetic retinopathy with macular edema: Secondary | ICD-10-CM | POA: Diagnosis not present

## 2023-10-31 DIAGNOSIS — G4733 Obstructive sleep apnea (adult) (pediatric): Secondary | ICD-10-CM | POA: Diagnosis not present

## 2023-10-31 DIAGNOSIS — Z79899 Other long term (current) drug therapy: Secondary | ICD-10-CM | POA: Insufficient documentation

## 2023-10-31 DIAGNOSIS — Z7984 Long term (current) use of oral hypoglycemic drugs: Secondary | ICD-10-CM | POA: Insufficient documentation

## 2023-10-31 DIAGNOSIS — M1990 Unspecified osteoarthritis, unspecified site: Secondary | ICD-10-CM | POA: Diagnosis not present

## 2023-10-31 DIAGNOSIS — Z794 Long term (current) use of insulin: Secondary | ICD-10-CM | POA: Diagnosis not present

## 2023-10-31 DIAGNOSIS — Z01818 Encounter for other preprocedural examination: Secondary | ICD-10-CM

## 2023-10-31 DIAGNOSIS — H918X3 Other specified hearing loss, bilateral: Secondary | ICD-10-CM | POA: Diagnosis not present

## 2023-10-31 DIAGNOSIS — K3184 Gastroparesis: Secondary | ICD-10-CM | POA: Diagnosis not present

## 2023-10-31 DIAGNOSIS — Z7985 Long-term (current) use of injectable non-insulin antidiabetic drugs: Secondary | ICD-10-CM | POA: Insufficient documentation

## 2023-10-31 DIAGNOSIS — X58XXXD Exposure to other specified factors, subsequent encounter: Secondary | ICD-10-CM | POA: Diagnosis not present

## 2023-10-31 DIAGNOSIS — E1165 Type 2 diabetes mellitus with hyperglycemia: Secondary | ICD-10-CM | POA: Diagnosis not present

## 2023-10-31 HISTORY — DX: Cardiac arrhythmia, unspecified: I49.9

## 2023-10-31 HISTORY — DX: Other complications of anesthesia, initial encounter: T88.59XA

## 2023-10-31 HISTORY — DX: Other specified postprocedural states: Z98.890

## 2023-10-31 HISTORY — DX: Nausea with vomiting, unspecified: R11.2

## 2023-10-31 LAB — BASIC METABOLIC PANEL WITH GFR
Anion gap: 9 (ref 5–15)
BUN: 14 mg/dL (ref 8–23)
CO2: 27 mmol/L (ref 22–32)
Calcium: 9.7 mg/dL (ref 8.9–10.3)
Chloride: 102 mmol/L (ref 98–111)
Creatinine, Ser: 0.78 mg/dL (ref 0.44–1.00)
GFR, Estimated: >60 mL/min (ref >60)
Glucose, Bld: 253 mg/dL — ABNORMAL HIGH (ref 70–99)
Potassium: 3.8 mmol/L (ref 3.5–5.1)
Sodium: 138 mmol/L (ref 135–145)

## 2023-10-31 LAB — CBC
HCT: 36.5 % (ref 36.0–46.0)
Hemoglobin: 11.1 g/dL — ABNORMAL LOW (ref 12.0–15.0)
MCH: 28.8 pg (ref 26.0–34.0)
MCHC: 30.4 g/dL (ref 30.0–36.0)
MCV: 94.8 fL (ref 80.0–100.0)
Platelets: 328 10*3/uL (ref 150–400)
RBC: 3.85 MIL/uL — ABNORMAL LOW (ref 3.87–5.11)
RDW: 14.7 % (ref 11.5–15.5)
WBC: 7.1 10*3/uL (ref 4.0–10.5)
nRBC: 0 % (ref 0.0–0.2)

## 2023-10-31 LAB — SURGICAL PCR SCREEN
MRSA, PCR: NEGATIVE
Staphylococcus aureus: NEGATIVE

## 2023-10-31 LAB — GLUCOSE, CAPILLARY: Glucose-Capillary: 242 mg/dL — ABNORMAL HIGH (ref 70–99)

## 2023-10-31 NOTE — Progress Notes (Addendum)
 For Anesthesia: PCP - Wilhemena Harbour, MD  Cardiologist - End, Veryl Gottron, MD  Clearance: Theotis Flake: PA-C: 08/21/23 Bowel Prep reminder:  Chest x-ray -  EKG - 08/21/23 Stress Test -  ECHO -  Cardiac Cath -  Pacemaker/ICD device last checked: Pacemaker orders received: Device Rep notified:  Spinal Cord Stimulator:N/A  Sleep Study - Yes CPAP - NO  Fasting Blood Sugar - 80's - 100's Checks Blood Sugar : continuous freestyle libre 3 Date and result of last Hgb A1c-8.1: 09/10/23  Last dose of GLP1 agonist- semaglutide  GLP1 instructions: To hold after:11/01/23  Last dose of SGLT-2 inhibitors- N/A SGLT-2 instructions:   Blood Thinner Instructions: Aspirin Instructions: Last Dose:  Activity level: Can go up a flight of stairs and activities of daily living without stopping and without chest pain and/or shortness of breath   Able to exercise without chest pain and/or shortness of breath  Anesthesia review: Hx: PSVT,DIA,OSA(NO CPAP).  Patient denies shortness of breath, fever, cough and chest pain at PAT appointment   Patient verbalized understanding of instructions that were given to them at the PAT appointment. Patient was also instructed that they will need to review over the PAT instructions again at home before surgery.

## 2023-11-01 ENCOUNTER — Encounter: Payer: Self-pay | Admitting: Pharmacist

## 2023-11-01 ENCOUNTER — Encounter (HOSPITAL_COMMUNITY): Payer: Self-pay

## 2023-11-01 ENCOUNTER — Other Ambulatory Visit (INDEPENDENT_AMBULATORY_CARE_PROVIDER_SITE_OTHER): Payer: Self-pay | Admitting: Pharmacist

## 2023-11-01 DIAGNOSIS — Z794 Long term (current) use of insulin: Secondary | ICD-10-CM

## 2023-11-01 DIAGNOSIS — E11311 Type 2 diabetes mellitus with unspecified diabetic retinopathy with macular edema: Secondary | ICD-10-CM

## 2023-11-01 DIAGNOSIS — E119 Type 2 diabetes mellitus without complications: Secondary | ICD-10-CM | POA: Diagnosis not present

## 2023-11-01 MED ORDER — INSULIN GLARGINE 100 UNIT/ML SOLOSTAR PEN: 8.0000 [IU] | PEN_INJECTOR | SUBCUTANEOUS | Status: DC

## 2023-11-01 NOTE — Progress Notes (Signed)
 Reviewed CGM report and contacted patient via 'Patient message'  "Alicia [name],   [place] you are enjoying the CGM - sensor.   I reviewed your readings and noticed you have had several low readings including lows at night.  I suggest reducing your Lantus  dose by 2-4 units from what you are currently using.   I would like to minimize low readings, as you know.   If you have been using 8 please reduce to 6 units of lantus . Thanks  See you in 1 week."   Adjusted dose of long-acting insulin .  Updated dosing instructions for Lantus  (insulin  glargine)

## 2023-11-01 NOTE — Telephone Encounter (Signed)
 Reviewed and agree with Dr Macky Lower plan.

## 2023-11-01 NOTE — Progress Notes (Signed)
 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 PMH of HTN, PSVT, mild OSA (no CPAP use), asthma, GERD, IDDM with gastroparesis and neuropathy, anemia, arthritis, deaf (needs sign language interpreter)  Pt follows with PCP. Last seen on 4/24. Pt reported several hypoglycemic events. DM is not well controlled. Last A1c was 8.1 on 09/10/23. Discussed with Dr. Love Rubens office and they are ok with proceeding.  Seen by Cardiology on 08/21/23 for pre op clearance. Pt reported occasional palpitations. She takes Coreg  and prn diltiazem . Cleared for surgery:  "Preop clearance for left reverse shoulder arthroplasty by Dr. Carter Clare, [facility]. Patient is an acceptable risk to proceed with above surgery based on current guidelines. According to the Revised Cardiac Risk Index (RCRI), her Perioperative Risk of Major Cardiac Event is (%): 0.9 Her Functional Capacity in METs is: 5.07 according to the Duke Activity Status Index (DASI)."   LD Ozempic :11/01/23  VS: BP 134/69   Pulse 79   Temp 36.6 C (Oral)   Ht 5\' 4"  (1.626 m)   Wt 69 kg   SpO2 96%   BMI 26.11 kg/m   PROVIDERS: Wilhemena Harbour, MD   LABS: Labs reviewed: Acceptable for surgery. (all labs ordered are listed, but only abnormal results are displayed)  Labs Reviewed  BASIC METABOLIC PANEL WITH GFR - Abnormal; Notable for the following components:      Result Value   Glucose, Bld 253 (*)    All other components within normal limits  CBC - Abnormal; Notable for the following components:   RBC 3.85 (*)    Hemoglobin 11.1 (*)    All other components within normal limits  GLUCOSE, CAPILLARY - Abnormal; Notable for the following  components:   Glucose-Capillary 242 (*)    All other components within normal limits  SURGICAL PCR SCREEN     IMAGES:   EKG:   CV: Stress test 09/14/2017:  Nuclear stress EF: 61%. The study is normal. The left ventricular ejection fraction is normal (55-65%).   1. EF 61%, normal wall motion.  2. No evidence for ischemia or infarction by perfusion images.     Normal study.  Cardiac monitor 09/14/2017:  The patient was monitored for 14 days. The predominant rhythm was sinus; average heart rate during triggered events was 94 bpm. Two episodes of narrow complex tachycardia were observed, suggestive of SVT. No ventricular arrhythmias or prolonged pauses were seen. Patient triggered events corresponding to rapid heart rate/palpitations/flutters correspond to sinus rhythm and narrow-complex tachycardia.   Predominantly sinus rhythm with episodes of narrow-complex tachycardia suggestive of paroxysmal supraventricular tachycardia Past Medical History:  Diagnosis Date   Anemia    Arthritis    back and knees   ARTHRITIS, KNEE 09/17/2007   Asthma 04/04/2010   pt states she does not have asthma   Cataract 01/07/2019   Closed fracture of distal end of left radius 11/01/2015   Complete deafness    Meningitis at age 23   Complication of anesthesia    Deaf    Diabetes mellitus    Diabetic neuropathy (HCC)    Dysrhythmia    Ganglion cyst 06/22/2011   Gastroparesis    GERD (  gastroesophageal reflux disease)    H/O: C-section    Hyperlipidemia    Hypertension    Neuromuscular disorder (HCC)    diabetic neuropathy   PONV (postoperative nausea and vomiting)    PSVT (paroxysmal supraventricular tachycardia) (HCC) 11/09/2017   Event monitor 09/14/2017 - Predominantly sinus rhythm with episodes of narrow-complex tachycardia suggestive of paroxysmal supraventricular tachycardia.   S/P appy     Past Surgical History:  Procedure Laterality Date   APPENDECTOMY     cardiolyte EF 77%,  no ischemia in 2006  07/03/2004   CARPAL TUNNEL RELEASE Right 04/20/2015   Procedure: RIGHT CARPAL TUNNEL RELEASE;  Surgeon: Lyanne Sample, MD;  Location: [facility];  Service: Orthopedics;  Laterality: Right;   CARPAL TUNNEL RELEASE Left 11/04/2015   Procedure: CARPAL TUNNEL RELEASE;  Surgeon: Lyanne Sample, MD;  Location: [facility];  Service: Orthopedics;  Laterality: Left;   CERVICAL FUSION     CESAREAN SECTION     OPEN REDUCTION INTERNAL FIXATION (ORIF) DISTAL RADIAL FRACTURE Left 11/04/2015   Procedure: OPEN REDUCTION INTERNAL FIXATION (ORIF) LEFT DISTAL RADIAL FRACTURE POSSIBLE BONE GRAFT;  Surgeon: Lyanne Sample, MD;  Location: [facility];  Service: Orthopedics;  Laterality: Left;   POSTERIOR CERVICAL FUSION/FORAMINOTOMY N/A 12/26/2021   Procedure: Posterior cervical fusion with lateral mass fixation - Cervical Three-Cervical Six, Cervical Laminectomy Cervical Three-Cervica; Five;  Surgeon: Isadora Mar, MD;  Location: [facility] OR;  Service: Neurosurgery;  Laterality: N/A;   TRIGGER FINGER RELEASE Right 04/20/2015   Procedure: RELEASE TRIGGER FINGER/A-1 PULLEY RIGHT MIDDLE FINGER,RIGHT RING FINGER;  Surgeon: Lyanne Sample, MD;  Location: [facility];  Service: Orthopedics;  Laterality: Right;   TUBAL LIGATION     ULNAR NERVE TRANSPOSITION Right 04/20/2015   Procedure: RIGHT ULNAR NERVE DECOMPRESSION;  Surgeon: Lyanne Sample, MD;  Location: [facility];  Service: Orthopedics;  Laterality: Right;   UTERINE FIBROID EMBOLIZATION  07/03/2005   WRIST SURGERY     Cyst removed on left   WRIST SURGERY Right 07/04/1983   tendon repair R wrist    MEDICATIONS:  ACCU-CHEK SOFTCLIX LANCETS lancets   acetaminophen  (TYLENOL ) 650 MG CR tablet   aspirin 81 MG chewable tablet   carvedilol  (COREG ) 25 MG tablet   cholecalciferol  (VITAMIN D ) 1000 UNITS tablet   Continuous Glucose Sensor (FREESTYLE LIBRE 3 PLUS SENSOR) MISC   Cyanocobalamin   (VITAMIN B 12) 500 MCG TABS   diclofenac  Sodium (VOLTAREN ) 1 % GEL   diltiazem  (CARDIZEM  CD) 240 MG 24 hr capsule   diltiazem  (CARDIZEM ) 30 MG tablet   DULoxetine  (CYMBALTA ) 60 MG capsule   famotidine  (PEPCID ) 20 MG tablet   fluticasone  (FLONASE ) 50 MCG/ACT nasal spray   gabapentin  (NEURONTIN ) 800 MG tablet   glucose blood test strip   hydrochlorothiazide  (MICROZIDE ) 12.5 MG capsule   insulin  glargine (LANTUS ) 100 UNIT/ML Solostar Pen   Insulin  Lispro w/ Trans Port (HUMALOG  TEMPO PEN) 100 UNIT/ML SOPN   Insulin  Pen Needle 31G X 5 MM MISC   metFORMIN  (GLUCOPHAGE ) 1000 MG tablet   Olopatadine  HCl 0.2 % SOLN   ONETOUCH ULTRA test strip   pantoprazole  (PROTONIX ) 40 MG tablet   rosuvastatin  (CRESTOR ) 5 MG tablet   Semaglutide , 2 MG/DOSE, (OZEMPIC , 2 MG/DOSE,) 8 MG/3ML SOPN   valsartan (DIOVAN) 160 MG tablet   No current facility-administered medications for this encounter.   Antoinette Kirschner [facility] Surgical Short Stay/Anesthesiology [facility] Phone [phone] 11/01/2023 10:29 AM

## 2023-11-01 NOTE — Anesthesia Preprocedure Evaluation (Addendum)
 Anesthesia Evaluation    Reviewed: Allergy & Precautions, Patient's Chart, lab work & pertinent test results, Unable to perform ROS - Chart review only  History of Anesthesia Complications (+) PONV and history of anesthetic complications  Airway        Dental   Pulmonary asthma , sleep apnea           Cardiovascular hypertension, Pt. on medications and Pt. on home beta blockers + dysrhythmias   Stress MPS 2019 Nuclear stress EF: 61%.  The study is normal.  The left ventricular ejection fraction is normal (55-65%).   1. EF 61%, normal wall motion.  2. No evidence for ischemia or infarction by perfusion images.     Normal study.       Neuro/Psych Seizures -, Well Controlled,   negative psych ROS   GI/Hepatic Neg liver ROS,GERD  Medicated,,  Endo/Other  diabetes, Poorly Controlled, Type 2, Insulin  Dependent, Oral Hypoglycemic Agents Hyperthyroidism   Renal/GU negative Renal ROS     Musculoskeletal  (+) Arthritis , Osteoarthritis,    Abdominal Normal abdominal exam  (+)   Peds  Hematology  (+) Blood dyscrasia, anemia Lab Results      Component                Value               Date                      WBC                      6.8                 12/22/2021                HGB                      12.4                12/22/2021                HCT                      38.2                12/22/2021                MCV                      93.6                12/22/2021                PLT                      255                 12/22/2021           Lab Results      Component                Value               Date                      NA  139                 12/22/2021                K                        3.4 (L)             12/22/2021                CO2                      26                  12/22/2021                GLUCOSE                  86                  12/22/2021                 BUN                      15                  12/22/2021                CREATININE               0.60                12/22/2021                CALCIUM                   10.5 (H)            12/22/2021                EGFR                     94                  05/13/2021                GFRNONAA                 >60                 12/22/2021             Anesthesia Other Findings   Reproductive/Obstetrics                             Anesthesia Physical Anesthesia Plan  ASA: 3  Anesthesia Plan: General   Post-op Pain Management: Tylenol  PO (pre-op)* and Regional block*   Induction: Intravenous, Rapid sequence and Cricoid pressure planned  PONV Risk Score and Plan: 3 and Ondansetron , Dexamethasone , Midazolam  and Treatment may vary due to age or medical condition  Airway Management Planned: Oral ETT  Additional Equipment: None  Intra-op Plan:   Post-operative Plan: Extubation in OR  Informed Consent:   Plan Discussed with:   Anesthesia Plan Comments: (See PAT note from 4/30)        Anesthesia Quick Evaluation

## 2023-11-08 ENCOUNTER — Ambulatory Visit: Admitting: Pharmacist

## 2023-11-08 ENCOUNTER — Encounter: Payer: Self-pay | Admitting: Pharmacist

## 2023-11-08 VITALS — BP 126/62 | HR 80 | Wt 145.4 lb

## 2023-11-08 DIAGNOSIS — E11311 Type 2 diabetes mellitus with unspecified diabetic retinopathy with macular edema: Secondary | ICD-10-CM | POA: Diagnosis not present

## 2023-11-08 NOTE — Patient Instructions (Addendum)
 It was nice to see you today! Good luck with surgery!  Your goal blood sugar is 80-130 before eating and less than 180 after eating.  Medication Changes: Increase Humalog  (insulin  lispro) to 6-8 units with meals  Continue Lantus  (insulin  glargine) 4 units at night  [facility] to schedule follow-up appointment with Dr. Ottavio Norem in one (1) month  Continue all other medication the same.  Keep up the good work with diet and exercise. Aim for a diet full of vegetables, fruit and lean meats (chicken, [place], fish). Try to limit salt intake by eating fresh or frozen vegetables (instead of canned), rinse canned vegetables prior to cooking and do not add any additional salt to meals.

## 2023-11-08 NOTE — Assessment & Plan Note (Signed)
 Diabetes longstanding 17 years (2008) currently with improved control however has significant variability of 39% including multiple symptomatic lows over the previous 2 weeks. Patient is able to provide the appropriate hypoglycemia management plan. Medication adherence appears good. Control is suboptimal due to stress regarding upcoming should replacement surgery (tomorrow 11/09/23). -Continued basal insulin  Lantus  (insulin  glargine) 4 units daily in the evening -Increased dose of rapid insulin  Humalog  (insulin  lispro) to 6 to 8 units with meals (2-3x per day) during post-operative period of higher stress/pain.  -Will continue to monitor CGM report post-surgery to determine need for further insulin  dose adjustments -Continued GLP-1 Ozempic  (semaglutide ) 2 mg weekly  -Continued metformin  2000 mg -Patient educated on purpose, proper use, and potential adverse effects.  -Extensively discussed pathophysiology of diabetes, recommended lifestyle interventions, dietary effects on blood sugar control.  -Counseled on s/sx of and management of hypoglycemia.

## 2023-11-08 NOTE — Progress Notes (Signed)
 S:     Chief Complaint  Patient presents with   Medication Management    Diabetes Follow-up - CGM Review   68 y.o. female who presents for diabetes evaluation, education, and management. Patient arrives in good spirits, but reports having nerves ahead of her upcoming surgery. Patient is accompanied by and visit conducted with assistance of ASL interpretor, Christiane Cowing.   Patient was referred and last seen by Primary Care Provider, Dr. Tenna Fees, on 10/10/23. PMH is significant for diabetes, diabetic neuropathy and retinopathy, hearing loss, HTN, HLD, OSA.  At last pharmacy visit on 10/25/23, patient reported occasional episodes of hypoglycemia. Patient was continued on Lantus  8 to 10 units daily and Humalog  4 to 6 units with meals.   Current diabetes medications include: Lantus  (insulin  glargine) 4 units daily at bedtime, Humalog  (insulin  lispro) 4-6 units 2-3x daily with meals, Ozempic  (semaglutide ) 2 mg weekly, metformin  2000 mg Current hypertension medications include: carvedilol  50 mg BID, diltiazem  CD 240 mg, hydrochlorothiazide  12.5 mg, valsartan 160 mg Current hyperlipidemia medications include: rosuvastatin  5 mg 2x weekly  Patient reports adherence to taking all medications as prescribed. Patient denies hypoglycemic events this visit.  Insurance coverage: [employer]  Patient reports nocturia (nighttime urination). Wakes up 2 to 3 times per night, no change recently. Patient reports neuropathy (nerve pain). Reports pain in her hands and feet, no change recently.  O:   Review of Systems  HENT:  Positive for hearing loss.   Musculoskeletal:  Positive for joint pain (Left Shoulder - Pending surgery).  All other systems reviewed and are negative.   Physical Exam Constitutional:      Appearance: Normal appearance. She is normal weight.  Neurological:     Mental Status: She is alert.  Psychiatric:        Mood and Affect: Mood normal.        Behavior: Behavior  normal.    Libre3 CGM Download today % Time CGM is active: 52% Average Glucose: 182 mg/dL Glucose Management Indicator: 7.7%  Glucose Variability: 39.4% (goal <36%) Time in Goal:  - Time in range 70-180: 52% - Time above range: 45% - Time below range: 3%  Lab Results  Component Value Date   HGBA1C 8.1 (A) 09/10/2023   Vitals:   11/08/23 0910  BP: 126/62  Pulse: 80    Lipid Panel     Component Value Date/Time   CHOL 198 02/22/2023 0826   TRIG 78 02/22/2023 0826   HDL 94 02/22/2023 0826   CHOLHDL 2.1 02/22/2023 0826   CHOLHDL 2.3 07/28/2016 0936   VLDL 12 07/28/2016 0936   LDLCALC 90 02/22/2023 0826   LDLDIRECT 78 07/10/2013 1441    Clinical Atherosclerotic Cardiovascular Disease (ASCVD): No  The 10-year ASCVD risk score (Arnett DK, et al., 2019) is: 20.8%   Values used to calculate the score:     Age: 15 years     Sex: Female     Is Non-Hispanic African American: Yes     Diabetic: Yes     Tobacco smoker: No     Systolic Blood Pressure: 126 mmHg     Is BP treated: Yes     HDL Cholesterol: 94 mg/dL     Total Cholesterol: 198 mg/dL   A/P: Diabetes longstanding 17 years (2008) currently with improved control however has significant variability of 39% including multiple symptomatic lows over the previous 2 weeks. Patient is able to provide the appropriate hypoglycemia management plan. Medication adherence appears good.  Control is suboptimal due to stress regarding upcoming should replacement surgery (tomorrow 11/09/23). -Continued basal insulin  Lantus  (insulin  glargine) 4 units daily in the evening -Increased dose of rapid insulin  Humalog  (insulin  lispro) to 6 to 8 units with meals (2-3x per day) during post-operative period of higher stress/pain.  -Will continue to monitor CGM report post-surgery to determine need for further insulin  dose adjustments -Continued GLP-1 Ozempic  (semaglutide ) 2 mg weekly  -Continued metformin  2000 mg -Patient educated on purpose, proper  use, and potential adverse effects.  -Extensively discussed pathophysiology of diabetes, recommended lifestyle interventions, dietary effects on blood sugar control.  -Counseled on s/sx of and management of hypoglycemia.  -Next A1c anticipated June 2025.   Written patient instructions provided. Patient verbalized understanding of treatment plan.  Total time in face to face counseling 24 minutes.    Follow-up:  Pharmacist in one month PCP clinic visit in PRN Patient seen with Noah [place], PharmD Candidate and Volney Grumbles, PharmD, PGY-1 pharmacy resident.

## 2023-11-09 ENCOUNTER — Other Ambulatory Visit: Payer: Self-pay

## 2023-11-09 ENCOUNTER — Encounter (HOSPITAL_COMMUNITY): Payer: Self-pay | Admitting: Orthopedic Surgery

## 2023-11-09 ENCOUNTER — Ambulatory Visit (HOSPITAL_COMMUNITY): Payer: Self-pay | Admitting: Medical

## 2023-11-09 ENCOUNTER — Ambulatory Visit (HOSPITAL_COMMUNITY)
Admission: RE | Admit: 2023-11-09 | Discharge: 2023-11-09 | Disposition: A | Discharge status: Home / Self Care | Attending: Orthopedic Surgery | Admitting: Orthopedic Surgery

## 2023-11-09 ENCOUNTER — Ambulatory Visit (HOSPITAL_COMMUNITY): Payer: Self-pay | Admitting: Anesthesiology

## 2023-11-09 ENCOUNTER — Telehealth: Payer: Self-pay

## 2023-11-09 ENCOUNTER — Encounter (HOSPITAL_COMMUNITY)
Admission: RE | Disposition: A | Discharge status: Home / Self Care | Payer: Self-pay | Source: Home / Self Care | Attending: Orthopedic Surgery

## 2023-11-09 ENCOUNTER — Encounter: Payer: Self-pay | Admitting: Pharmacist

## 2023-11-09 DIAGNOSIS — E1165 Type 2 diabetes mellitus with hyperglycemia: Secondary | ICD-10-CM | POA: Insufficient documentation

## 2023-11-09 DIAGNOSIS — E1143 Type 2 diabetes mellitus with diabetic autonomic (poly)neuropathy: Secondary | ICD-10-CM | POA: Insufficient documentation

## 2023-11-09 DIAGNOSIS — K219 Gastro-esophageal reflux disease without esophagitis: Secondary | ICD-10-CM | POA: Insufficient documentation

## 2023-11-09 DIAGNOSIS — Z538 Procedure and treatment not carried out for other reasons: Secondary | ICD-10-CM | POA: Insufficient documentation

## 2023-11-09 DIAGNOSIS — Z794 Long term (current) use of insulin: Secondary | ICD-10-CM | POA: Diagnosis not present

## 2023-11-09 DIAGNOSIS — Z7985 Long-term (current) use of injectable non-insulin antidiabetic drugs: Secondary | ICD-10-CM | POA: Insufficient documentation

## 2023-11-09 DIAGNOSIS — X58XXXA Exposure to other specified factors, initial encounter: Secondary | ICD-10-CM | POA: Insufficient documentation

## 2023-11-09 DIAGNOSIS — Z7984 Long term (current) use of oral hypoglycemic drugs: Secondary | ICD-10-CM | POA: Insufficient documentation

## 2023-11-09 DIAGNOSIS — S42202A Unspecified fracture of upper end of left humerus, initial encounter for closed fracture: Secondary | ICD-10-CM | POA: Diagnosis not present

## 2023-11-09 LAB — GLUCOSE, CAPILLARY: Glucose-Capillary: 415 mg/dL — ABNORMAL HIGH (ref 70–99)

## 2023-11-09 SURGERY — ARTHROPLASTY, SHOULDER, TOTAL, REVERSE
Anesthesia: General | Site: Shoulder | Laterality: Left

## 2023-11-09 MED ORDER — TRANEXAMIC ACID-NACL 1000-0.7 MG/100ML-% IV SOLN: 1000.0000 mg | INTRAVENOUS | Status: DC

## 2023-11-09 MED ORDER — MIDAZOLAM HCL 2 MG/2ML IJ SOLN
INTRAMUSCULAR | Status: AC
  Filled 2023-11-09: qty 2

## 2023-11-09 MED ORDER — CHLORHEXIDINE GLUCONATE 0.12 % MT SOLN: 15.0000 mL | Freq: Once | OROMUCOSAL | Status: DC

## 2023-11-09 MED ORDER — FENTANYL CITRATE (PF) 100 MCG/2ML IJ SOLN
INTRAMUSCULAR | Status: AC
  Filled 2023-11-09: qty 2

## 2023-11-09 MED ORDER — LIDOCAINE HCL (PF) 2 % IJ SOLN
INTRAMUSCULAR | Status: AC
  Filled 2023-11-09: qty 5

## 2023-11-09 MED ORDER — ORAL CARE MOUTH RINSE
15.0000 mL | Freq: Once | OROMUCOSAL | Status: DC
Start: 2023-11-09 — End: 2023-11-09

## 2023-11-09 MED ORDER — ROCURONIUM BROMIDE 10 MG/ML (PF) SYRINGE
PREFILLED_SYRINGE | INTRAVENOUS | Status: AC
  Filled 2023-11-09: qty 10

## 2023-11-09 MED ORDER — ONDANSETRON HCL 4 MG/2ML IJ SOLN
INTRAMUSCULAR | Status: AC
  Filled 2023-11-09: qty 2

## 2023-11-09 MED ORDER — ACETAMINOPHEN 500 MG PO TABS: 1000.0000 mg | ORAL_TABLET | Freq: Once | ORAL | Status: DC

## 2023-11-09 MED ORDER — CEFAZOLIN SODIUM-DEXTROSE 2-4 GM/100ML-% IV SOLN: 2.0000 g | INTRAVENOUS | Status: DC

## 2023-11-09 MED ORDER — PROPOFOL 10 MG/ML IV BOLUS
INTRAVENOUS | Status: AC
  Filled 2023-11-09: qty 20

## 2023-11-09 MED ORDER — LACTATED RINGERS IV SOLN: INTRAVENOUS | Status: DC

## 2023-11-09 NOTE — H&P (Signed)
 ORTHOPAEDIC h and P  REQUESTING PHYSICIAN: Janeth Medicus, MD  PCP:  Wilhemena Harbour, MD  Chief Complaint:Left shoulder cuff arthropathy  HPI: Alicia [name] is a 67 y.o. female who complains of left shoulder pain and failure of conservativ4e treatm4ents.  Here today for reverse shoulder replacement.  Past Medical History:  Diagnosis Date   Anemia    Arthritis    back and knees   ARTHRITIS, KNEE 09/17/2007   Asthma 04/04/2010   pt states she does not have asthma   Cataract 01/07/2019   Closed fracture of distal end of left radius 11/01/2015   Complete deafness    Meningitis at age 17   Complication of anesthesia    Deaf    Diabetes mellitus    Diabetic neuropathy (HCC)    Dysrhythmia    Ganglion cyst 06/22/2011   Gastroparesis    GERD (gastroesophageal reflux disease)    H/O: C-section    Hyperlipidemia    Hypertension    PONV (postoperative nausea and vomiting)    PSVT (paroxysmal supraventricular tachycardia) (HCC) 11/09/2017   Event monitor 09/14/2017 - Predominantly sinus rhythm with episodes of narrow-complex tachycardia suggestive of paroxysmal supraventricular tachycardia.   S/P appy    Past Surgical History:  Procedure Laterality Date   APPENDECTOMY     cardiolyte EF 77%, no ischemia in 2006  07/03/2004   CARPAL TUNNEL RELEASE Right 04/20/2015   Procedure: RIGHT CARPAL TUNNEL RELEASE;  Surgeon: Lyanne Sample, MD;  Location: [facility];  Service: Orthopedics;  Laterality: Right;   CARPAL TUNNEL RELEASE Left 11/04/2015   Procedure: CARPAL TUNNEL RELEASE;  Surgeon: Lyanne Sample, MD;  Location: [facility];  Service: Orthopedics;  Laterality: Left;   CERVICAL FUSION     CESAREAN SECTION     OPEN REDUCTION INTERNAL FIXATION (ORIF) DISTAL RADIAL FRACTURE Left 11/04/2015   Procedure: OPEN REDUCTION INTERNAL FIXATION (ORIF) LEFT DISTAL RADIAL FRACTURE POSSIBLE BONE GRAFT;  Surgeon: Lyanne Sample, MD;  Location: [facility];  Service: Orthopedics;  Laterality: Left;   POSTERIOR CERVICAL FUSION/FORAMINOTOMY N/A 12/26/2021   Procedure: Posterior cervical fusion with lateral mass fixation - Cervical Three-Cervical Six, Cervical Laminectomy Cervical Three-Cervica; Five;  Surgeon: Isadora Mar, MD;  Location: [facility];  Service: Neurosurgery;  Laterality: N/A;   TRIGGER FINGER RELEASE Right 04/20/2015   Procedure: RELEASE TRIGGER FINGER/A-1 PULLEY RIGHT MIDDLE FINGER,RIGHT RING FINGER;  Surgeon: Lyanne Sample, MD;  Location: [facility];  Service: Orthopedics;  Laterality: Right;   TUBAL LIGATION     ULNAR NERVE TRANSPOSITION Right 04/20/2015   Procedure: RIGHT ULNAR NERVE DECOMPRESSION;  Surgeon: Lyanne Sample, MD;  Location: [facility];  Service: Orthopedics;  Laterality: Right;   UTERINE FIBROID EMBOLIZATION  07/03/2005   WRIST SURGERY     Cyst removed on left   WRIST SURGERY Right 07/04/1983   tendon repair R wrist   Social History   Socioeconomic History   Marital status: Divorced    Spouse name: Not on file   Number of children: 3   Years of education: 12   Highest education level: Not on file  Occupational History   Occupation: daycare    Employer: [employer]  Tobacco Use   Smoking status: Never    Passive exposure: Never   Smokeless tobacco: Never  Vaping Use   Vaping status: Never Used  Substance and Sexual Activity   Alcohol use: Yes    Alcohol/week: 3.0 standard drinks  of alcohol    Types: 3 Glasses of wine per week    Comment: 1-2 wine occasional, not every week   Drug use: Never   Sexual activity: Yes    Birth control/protection: Surgical  Other Topics Concern   Not on file  Social History Narrative   Lives at home with daughter   Right handed   Caffeine: often   Social Drivers of Corporate investment banker Strain: Not on file  Food Insecurity: Not on file  Transportation Needs: Not on file  Physical Activity: Not on file  Stress: Not  on file  Social Connections: Not on file   Family History  Problem Relation Age of Onset   Hypertension Mother    Heart attack Mother 62   Diabetes Father    Cancer Maternal Aunt    Cancer Maternal Grandmother    Neuropathy Neg Hx    Breast cancer Neg Hx    BRCA 1/2 Neg Hx    Allergies  Allergen Reactions   Sulfonamide Derivatives Swelling and Rash    "Lost BABY" - Terrible itching.    Lipitor [Atorvastatin Calcium ] Other (See Comments)    Muscle Aches - Mild-Moderate - completely resolved with D/C of atorva.    Ramipril Cough        Trulicity  [Dulaglutide ] Nausea And Vomiting   Prior to Admission medications   Medication Sig Start Date End Date Taking? Authorizing Provider  acetaminophen  (TYLENOL ) 650 MG CR tablet Take 2 tablets (1,300 mg total) by mouth every 8 (eight) hours as needed for pain. 11/26/20  Yes Hensel, Azucena Bollard, MD  aspirin 81 MG chewable tablet Chew 81 mg by mouth daily.   Yes [provider]  carvedilol  (COREG ) 25 MG tablet TAKE 1 TABLET BY MOUTH TWICE A DAY. 04/09/23  Yes Conte, Tessa N, PA-C  cholecalciferol  (VITAMIN D ) 1000 UNITS tablet Take 1,000 Units by mouth daily.   Yes [provider]  Cyanocobalamin  (VITAMIN B 12) 500 MCG TABS Take 500 mcg by mouth daily at 6 (six) AM.   Yes [provider]  diclofenac  Sodium (VOLTAREN ) 1 % GEL Apply 2 g topically 4 (four) times daily. Rub into affected area of foot 2 to 4 times daily Patient taking differently: Apply 2 g topically 4 (four) times daily as needed (pain). 09/18/19  Yes Charity Conch, DPM  diltiazem  (CARDIZEM  CD) 240 MG 24 hr capsule Take 1 capsule (240 mg total) by mouth daily. 03/08/23  Yes Sowell, Ace Holder, MD  diltiazem  (CARDIZEM ) 30 MG tablet TAKE 1 TABLET BY MOUTH AS NEEDED (HEART RACING OR PALPITATIONS THAT LAST LONGER THAN 15 MINS). 03/24/22  Yes Debbie Fails, PA-C  DULoxetine  (CYMBALTA ) 60 MG capsule TAKE 1 CAPSULE BY MOUTH EVERY DAY 09/24/23  Yes Sowell, Ace Holder, MD   famotidine  (PEPCID ) 20 MG tablet TAKE 1 TABLET BY MOUTH TWICE A DAY 09/04/23  Yes Sowell, Ace Holder, MD  fluticasone  (FLONASE ) 50 MCG/ACT nasal spray 2 puffs each nostril daily 03/07/22  Yes Sowell, Ace Holder, MD  gabapentin  (NEURONTIN ) 800 MG tablet TAKE 1 TABLET BY MOUTH TWICE A DAY AS NEEDED Patient taking differently: Take 800 mg by mouth 2 (two) times daily. 10/08/23  Yes Charity Conch, DPM  hydrochlorothiazide  (MICROZIDE ) 12.5 MG capsule TAKE 1 CAPSULE BY MOUTH EVERY DAY 06/28/23  Yes Limmie Ren, MD  Insulin  Lispro w/ Trans Port (HUMALOG  TEMPO PEN) 100 UNIT/ML SOPN INJECT 2-4 UNITS INTO THE SKIN 3 (THREE) TIMES DAILY WITH MEALS Patient taking differently: INJECT  4-6 UNITS INTO THE SKIN 3 (THREE) TIMES DAILY WITH MEALS AS NEEDED FOR BS 120 OR HIGHER 01/30/23  Yes Chambliss, Vesta Gourd, MD  metFORMIN  (GLUCOPHAGE ) 1000 MG tablet TAKE 1 TABLET BY MOUTH TWICE A DAY 08/24/23  Yes Sowell, Ace Holder, MD  Olopatadine  HCl 0.2 % SOLN Place 1 drop into both eyes daily.   Yes [provider]  pantoprazole  (PROTONIX ) 40 MG tablet Take 1 tablet (40 mg total) by mouth daily. 04/04/23  Yes Sowell, Ace Holder, MD  rosuvastatin  (CRESTOR ) 5 MG tablet TAKE 1 TABLET (5 MG TOTAL) BY MOUTH 2 TIMES A WEEK 05/25/23  Yes Camnitz, Will Gaylyn Keas, MD  Semaglutide , 2 MG/DOSE, (OZEMPIC , 2 MG/DOSE,) 8 MG/3ML SOPN Inject 2 mg into the skin once a week. 02/28/23  Yes Sowell, Ace Holder, MD  valsartan (DIOVAN) 160 MG tablet TAKE 1 TABLET BY MOUTH EVERYDAY AT BEDTIME 02/26/23  Yes Sowell, Ace Holder, MD  ACCU-CHEK SOFTCLIX LANCETS lancets TEST 4 TIMES A DAY 07/11/17   Massie Soles, MD  Continuous Glucose Sensor (FREESTYLE LIBRE 3 PLUS SENSOR) MISC Change sensor every 15 days. 10/25/23   McDiarmid, Demetra Filter, MD  glucose blood test strip Use as instructed 08/09/22   Wilhemena Harbour, MD  insulin  glargine (LANTUS ) 100 UNIT/ML Solostar Pen Inject 8-16 Units into the skin every morning. Patient taking differently: Inject 4 Units into the skin at  bedtime. 11/01/23   McDiarmid, Demetra Filter, MD  Insulin  Pen Needle 31G X 5 MM MISC 1 Container by Does not apply route once as needed for up to 1 dose. 10/27/20   Mullis, Kiersten P, DO  ONETOUCH ULTRA test strip USE 1 NEW STRIP TO TEST BLOOD SUGAR 3 TIMES PER DAY. E11.9 05/24/23   Genora Kidd, MD   No results found.  Positive ROS: All other systems have been reviewed and were otherwise negative with the exception of those mentioned in the HPI and as above.  Physical Exam: General: Alert, no acute distress Cardiovascular: No pedal edema Respiratory: No cyanosis, no use of accessory musculature GI: No organomegaly, abdomen is soft and non-tender Skin: No lesions in the area of chief complaint Neurologic: Sensation intact distally Psychiatric: Patient is competent for consent with normal mood and affect Lymphatic: No axillary or cervical lymphadenopathy  MUSCULOSKELETAL: LUE- wwp, nvi  Assessment: Left shoulder rotator cuff tear arthropathy  Plan: Plan for reverse shoulder replacement today.  - risks and benefits discussed, and informed consent obtained  - plan for dc home post op    Janeth Medicus, MD Cell [phone]    11/09/2023 6:41 AM

## 2023-11-09 NOTE — Progress Notes (Signed)
 Dr. Hiram Lukes spoke with patient who recommended patient be cancelled today. Instructed patient to go home take her regular home dose of insulin  recheck her blood sugar and call her PCP when they open to be seen today.  Patient and daughter  verbalized understanding.

## 2023-11-09 NOTE — OR Nursing (Signed)
 Patient with CBG of 415 on arrival to short stay. Per Dr. Joann Mu surgery to be canceled.

## 2023-11-09 NOTE — Telephone Encounter (Signed)
 Patient calls nurse line requesting to speak with Dr. Koval.   She reports she went in for scheduled surgery this morning, however her CBG was 415. Therefore, her surgery was cancelled.   She reports confusion as blood CBG was 415. However CGM reading was 366. She reports the surgeon still didn't feel comfortable.   She reports taking Metformin  and Lantus  this morning as directed.  She reports she had her first meal of the day, biscuits, eggs and a muffin. CBG now 386. She does report taking Humalog .  She denies any hyperglycemic symptoms and reports she feels fine.   Advised will forward to Dr. Koval for advisement.   Precautions discussed with patient.

## 2023-11-14 NOTE — Progress Notes (Signed)
 Reviewed and agree with Dr Macky Lower plan.

## 2023-11-22 DIAGNOSIS — H43813 Vitreous degeneration, bilateral: Secondary | ICD-10-CM | POA: Diagnosis not present

## 2023-11-22 DIAGNOSIS — H35373 Puckering of macula, bilateral: Secondary | ICD-10-CM | POA: Diagnosis not present

## 2023-11-22 DIAGNOSIS — E113592 Type 2 diabetes mellitus with proliferative diabetic retinopathy without macular edema, left eye: Secondary | ICD-10-CM | POA: Diagnosis not present

## 2023-11-22 DIAGNOSIS — H35033 Hypertensive retinopathy, bilateral: Secondary | ICD-10-CM | POA: Diagnosis not present

## 2023-11-22 DIAGNOSIS — H43392 Other vitreous opacities, left eye: Secondary | ICD-10-CM | POA: Diagnosis not present

## 2023-11-22 LAB — HM DIABETES EYE EXAM

## 2023-12-01 ENCOUNTER — Other Ambulatory Visit: Payer: Self-pay | Admitting: Student

## 2023-12-02 DIAGNOSIS — E119 Type 2 diabetes mellitus without complications: Secondary | ICD-10-CM | POA: Diagnosis not present

## 2023-12-11 ENCOUNTER — Encounter: Payer: Self-pay | Admitting: *Deleted

## 2023-12-20 ENCOUNTER — Ambulatory Visit (INDEPENDENT_AMBULATORY_CARE_PROVIDER_SITE_OTHER): Admitting: Podiatry

## 2023-12-20 DIAGNOSIS — M792 Neuralgia and neuritis, unspecified: Secondary | ICD-10-CM

## 2023-12-20 DIAGNOSIS — M79675 Pain in left toe(s): Secondary | ICD-10-CM | POA: Diagnosis not present

## 2023-12-20 DIAGNOSIS — M79674 Pain in right toe(s): Secondary | ICD-10-CM | POA: Diagnosis not present

## 2023-12-20 DIAGNOSIS — B351 Tinea unguium: Secondary | ICD-10-CM | POA: Diagnosis not present

## 2023-12-20 NOTE — Progress Notes (Signed)
  Subjective:  Patient ID: Alicia [name], female    DOB: [birth year],  MRN: [id number]  68 y.o. female presents with the above complaint. She is hearing impaired and is here with an interpreter today.  She utilizes gabapentin  but still getting burning to the feet. No open lesions.   A1c was 8.1 on 09/10/2023  Wilhemena Harbour, MD   Objective:  Physical Exam: General: AAO x3, NAD- presents with translator   Dermatological: Nails are hypertrophic, dystrophic, brittle, discolored, elongated 10. No surrounding redness or drainage.  No open lesions or pre-ulcerative lesions are identified today.  Vascular: Dorsalis Pedis artery and Posterior Tibial artery pedal pulses are 2/4 bilateral with immedate capillary fill time. There is no pain with calf compression, swelling, warmth, erythema.   Neruologic: Grossly intact via light touch bilateral.   Musculoskeletal: Bunion is present. No area of pinpoint tenderness noted. No edema.   Gait: Unassisted, Nonantalgic.   Assessment:   Symptomatic onychomycosis; symptomatic neuropathy    Plan:  Patient was evaluated and treated and all questions answered.  Symptomatic onychomycosis -Sharply debrided x 10 without any complications or bleeding.   Symptomatic neuropathy - Currently on gabapentin  twice a day but still experiencing symptoms.  She already gets drowsy history of stroke, chronic was controlled and topical medications, creams she can use in conjunction -Daily foot inspection.   Return in about 3 months (around 03/21/2024) for nail trim, neuropathy .  Charity Conch DPM

## 2023-12-20 NOTE — Patient Instructions (Signed)
 For topical medication I like Aspercreme with lidocaine , Voltaren  gel, capsicin cream.     --  Diabetes Mellitus and Foot Care Diabetes, also called diabetes mellitus, may cause problems with your feet and legs because of poor blood flow (circulation). Poor circulation may make your skin: Become thinner and drier. Break more easily. Heal more slowly. Peel and crack. You may also have nerve damage (neuropathy). This can cause decreased feeling in your legs and feet. This means that you may not notice minor injuries to your feet that could lead to more serious problems. Finding and treating problems early is the best way to prevent future foot problems. How to care for your feet Foot hygiene  Wash your feet daily with warm water and mild soap. Do not use hot water. Then, pat your feet and the areas between your toes until they are fully dry. Do not soak your feet. This can dry your skin. Trim your toenails straight across. Do not dig under them or around the cuticle. File the edges of your nails with an emery board or nail file. Apply a moisturizing lotion or petroleum jelly to the skin on your feet and to dry, brittle toenails. Use lotion that does not contain alcohol and is unscented. Do not apply lotion between your toes. Shoes and socks Wear clean socks or stockings every day. Make sure they are not too tight. Do not wear knee-high stockings. These may decrease blood flow to your legs. Wear shoes that fit well and have enough cushioning. Always look in your shoes before you put them on to be sure there are no objects inside. To break in new shoes, wear them for just a few hours a day. This prevents injuries on your feet. Wounds, scrapes, corns, and calluses  Check your feet daily for blisters, cuts, bruises, sores, and redness. If you cannot see the bottom of your feet, use a mirror or ask someone for help. Do not cut off corns or calluses or try to remove them with medicine. If you find  a minor scrape, cut, or break in the skin on your feet, keep it and the skin around it clean and dry. You may clean these areas with mild soap and water. Do not clean the area with peroxide, alcohol, or iodine. If you have a wound, scrape, corn, or callus on your foot, look at it several times a day to make sure it is healing and not infected. Check for: Redness, swelling, or pain. Fluid or blood. Warmth. Pus or a bad smell. General tips Do not cross your legs. This may decrease blood flow to your feet. Do not use heating pads or hot water bottles on your feet. They may burn your skin. If you have lost feeling in your feet or legs, you may not know this is happening until it is too late. Protect your feet from hot and cold by wearing shoes, such as at the beach or on hot pavement. Schedule a complete foot exam at least once a year or more often if you have foot problems. Report any cuts, sores, or bruises to your health care provider right away. Where to find more information American Diabetes Association: diabetes.org Association of Diabetes Care & Education Specialists: diabeteseducator.org Contact a health care provider if: You have a condition that increases your risk of infection, and you have any cuts, sores, or bruises on your feet. You have an injury that is not healing. You have redness on your legs or feet.  You feel burning or tingling in your legs or feet. You have pain or cramps in your legs and feet. Your legs or feet are numb. Your feet always feel cold. You have pain around any toenails. Get help right away if: You have a wound, scrape, corn, or callus on your foot and: You have signs of infection. You have a fever. You have a red line going up your leg. This information is not intended to replace advice given to you by your health care provider. Make sure you discuss any questions you have with your health care provider. Document Revised: 12/21/2021 Document Reviewed:  12/21/2021 Elsevier Patient Education  2024 [employer].

## 2023-12-23 ENCOUNTER — Other Ambulatory Visit: Payer: Self-pay | Admitting: Student

## 2023-12-24 ENCOUNTER — Encounter: Admitting: Student

## 2023-12-29 ENCOUNTER — Other Ambulatory Visit: Payer: Self-pay | Admitting: Student

## 2023-12-29 DIAGNOSIS — Z794 Long term (current) use of insulin: Secondary | ICD-10-CM

## 2023-12-30 ENCOUNTER — Other Ambulatory Visit: Payer: Self-pay | Admitting: Podiatry

## 2024-01-01 ENCOUNTER — Ambulatory Visit: Admitting: Student

## 2024-01-01 VITALS — BP 154/75 | HR 80 | Wt 145.8 lb

## 2024-01-01 DIAGNOSIS — I10 Essential (primary) hypertension: Secondary | ICD-10-CM

## 2024-01-01 DIAGNOSIS — E11311 Type 2 diabetes mellitus with unspecified diabetic retinopathy with macular edema: Secondary | ICD-10-CM

## 2024-01-01 DIAGNOSIS — E119 Type 2 diabetes mellitus without complications: Secondary | ICD-10-CM | POA: Diagnosis not present

## 2024-01-01 LAB — POCT GLYCOSYLATED HEMOGLOBIN (HGB A1C): HbA1c, POC (controlled diabetic range): 9 % — AB (ref 0.0–7.0)

## 2024-01-01 NOTE — Patient Instructions (Addendum)
 It was great to see you! Thank you for allowing me to participate in your care!  I recommend that you always bring your medications to each appointment as this makes it easy to ensure we are on the correct medications and helps us  not miss when refills are needed.  Our plans for today:  Happy Birthday on the [birth year]th!!  - Diabetes Checking your A1c today. It seems like your sugars have been better controlled. Be sure to avoid hypoglycemia, or low sugars.   *Don't take lantus  that day if your fasting sugar is 100 or below *Keep sugary snacks available incase sugars get too low.   Continue:  Lantus  6 units daily in the evening Humalog  6 to 8 units with meals Ozempic  2 mg weekly Metformin  1000 mg twice a day   Take care and seek immediate care sooner if you develop any concerns.   Dr. Penne Rhein, MD [facility]

## 2024-01-01 NOTE — Assessment & Plan Note (Addendum)
 Patient comes in for follow-up of her diabetes.  Patient reports good compliance with medication, has adjusted her regimen.  Patient taking 6 units of Lantus , 16 units of NovoLog , with fasting sugars range between 80 and 150.  Patient will take long-acting insulin  on days where her fasting sugars below 100.  Patient had 1 episode of hypoglycemia, which is better than previously where she had multiple.  Will continue to hold regimen at current level right now with close follow-up. - Lantus  6 units daily - Humalog  6 to 8 units 3 times daily with meals - Ozempic  2 mg weekly - Metformin  1000 mg twice daily - Follow-up 3 months - A1c

## 2024-01-01 NOTE — Progress Notes (Signed)
  SUBJECTIVE:   CHIEF COMPLAINT / HPI:   Diabetes Meds: Lantus  4 units daily in the evening, Humalog  6 to 8 units with meals, Ozempic  2 mg weekly, metformin  2000 mg  Today: Lantus  6 units.  Humalog  6-8 units Ozempic : 2 mg weekly Metformin : 1000 mg BID Fasting CBG: 80-150, usually 120   PERTINENT  PMH / PSH:   OBJECTIVE:  BP (!) 157/70   Pulse 80   Wt 145 lb 12.8 oz (66.1 kg)   SpO2 100%   BMI 25.03 kg/m  Physical Exam Constitutional:      General: She is not in acute distress.    Appearance: Normal appearance. She is not ill-appearing.   Cardiovascular:     Rate and Rhythm: Normal rate and regular rhythm.     Pulses: Normal pulses.     Heart sounds: Normal heart sounds. No murmur heard.    No friction rub. No gallop.  Pulmonary:     Effort: Pulmonary effort is normal. No respiratory distress.     Breath sounds: Normal breath sounds. No stridor. No wheezing or rales.  Abdominal:     General: There is no distension.     Palpations: Abdomen is soft. There is no mass.     Tenderness: There is no abdominal tenderness. There is no guarding.     Hernia: No hernia is present.   Neurological:     Mental Status: She is alert.   Psychiatric:        Mood and Affect: Mood normal.        Behavior: Behavior normal.      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  Patient reports good compliance with medication, has adjusted her regimen.  Patient taking 6 units of Lantus , 16 units of NovoLog , with fasting sugars range between 80 and 150.  Patient will take long-acting insulin  on days where her fasting sugars below 100.  Patient had 1 episode of hypoglycemia, which is better than previously where she had multiple.  Will continue to hold regimen at current level right now with  close follow-up. - Lantus  6 units daily - Humalog  6 to 8 units 3 times daily with meals - Ozempic  2 mg weekly - Metformin  1000 mg twice daily - Follow-up 3 months - A1c Hypertension, unspecified type Patient with patient with elevated patient patient has the last week.  Patient intends to pick up Tomorrow and restart meds. - Continue blood pressure medications - Follow-up BP check 2 weeks No follow-ups on file. Penne Rhein, MD 01/01/2024, 2:55 PM PGY-3, [facility]

## 2024-01-01 NOTE — Assessment & Plan Note (Addendum)
 Patient with patient with elevated patient patient has the last week.  Patient intends to pick up Tomorrow and restart meds. - Continue blood pressure medications - Follow-up BP check 2 weeks

## 2024-01-03 ENCOUNTER — Telehealth: Payer: Self-pay

## 2024-01-03 NOTE — Telephone Encounter (Signed)
 Patient calls nurse line regarding her recent A1c. She is asking if she needs to make any medication adjustments given her A1c increasing from 8.1 to 9.0  Advised patient that I would reach out to Dr. Jennelle regarding next steps.   Patient appreciative.   Chiquita JAYSON English, RN

## 2024-01-09 ENCOUNTER — Ambulatory Visit: Payer: Self-pay | Admitting: Student

## 2024-01-10 NOTE — Telephone Encounter (Signed)
 Pt called and left message requesting she meet w/ Dr. Koval about her DM2

## 2024-01-16 ENCOUNTER — Ambulatory Visit (INDEPENDENT_AMBULATORY_CARE_PROVIDER_SITE_OTHER): Admitting: Pharmacist

## 2024-01-16 ENCOUNTER — Encounter: Payer: Self-pay | Admitting: Pharmacist

## 2024-01-16 VITALS — BP 117/63 | HR 75 | Wt 144.8 lb

## 2024-01-16 DIAGNOSIS — E1169 Type 2 diabetes mellitus with other specified complication: Secondary | ICD-10-CM

## 2024-01-16 DIAGNOSIS — Z794 Long term (current) use of insulin: Secondary | ICD-10-CM

## 2024-01-16 NOTE — Progress Notes (Signed)
 S:     Chief Complaint  Patient presents with   Medication Management    Diabetes - CGM use / Surgery pending control improvement   68 y.o. female who presents for diabetes evaluation, education, and management. Patient arrives in good spirits.  Patient is accompanied by and visit conducted with assistance of ASL interpretor, Nathanel.    Patient was referred and last seen by Primary Care Provider, Dr. Jennelle, on 01/09/24. PMH is significant for diabetes, diabetic neuropathy and retinopathy, hearing loss, HTN, HLD, OSA.   At last pharmacy visit on 11/08/23, patient reported pending should replacement surgery on 5/9 At that time her GMI was 7.7 - Average glucose of 182 Due to anxiety and dose adjustment of insulin  to lower dose prior to surgery, patient arrived and surgery with elevated glucose and surgery was cancelled.  Shoulder has diminished range of motion and patient reports pain ranges 5-9/10 throughout the day.    She has not had any follow-up from the surgery team.  She has stopped using her CGM due to too many alarms.      Current diabetes medications include: Lantus  (insulin  glargine) 4 units daily at bedtime, Humalog  (insulin  lispro) 4-8 units 2-3x daily with meals, Ozempic  (semaglutide ) 2 mg weekly, metformin  2000 mg per day   Patient reports adherence to taking all medications as prescribed. Patient denies hypoglycemic events this visit.   Insurance coverage: [employer]    O:   Review of Systems  Musculoskeletal:  Positive for joint pain (left shoulder).  All other systems reviewed and are negative.   Physical Exam Vitals reviewed.  Constitutional:      Appearance: Normal appearance.  Pulmonary:     Effort: Pulmonary effort is normal.  Neurological:     Mental Status: She is alert.  Psychiatric:        Mood and Affect: Mood normal.        Behavior: Behavior normal.        Thought Content: Thought content normal.        Judgment: Judgment  normal.     Lab Results  Component Value Date   HGBA1C 9.0 (A) 01/01/2024   Vitals:   01/16/24 1052  BP: 117/63  Pulse: 75  SpO2: 97%    Lipid Panel     Component Value Date/Time   CHOL 198 02/22/2023 0826   TRIG 78 02/22/2023 0826   HDL 94 02/22/2023 0826   CHOLHDL 2.1 02/22/2023 0826   CHOLHDL 2.3 07/28/2016 0936   VLDL 12 07/28/2016 0936   LDLCALC 90 02/22/2023 0826   LDLDIRECT 78 07/10/2013 1441     A/P: Diabetes longstanding 17 years (2008) currently with worsened control based on recent A1C likely due to stress of shoulder pain and worry about surgery plan. Patient is able to provide the appropriate hypoglycemia management plan. Medication adherence appears good. Willing to restart CGM with ALARMS adjusted to minimize audible alarms. -Continued basal insulin  Lantus  (insulin  glargine) 4 units daily in the evening - Continued rapid insulin  Humalog  (insulin  lispro) to 4 to 8 units with meals (2-3x per day) - encouraged to use if pre-meal glucose is > 150 and low doses of 2 or 4 units with a meal are acceptable.  - Restarted CGM on phone with sample sensor.  - Continued GLP-1 Ozempic  (semaglutide ) 2 mg weekly  - Continued metformin  2000 mg -Patient educated on purpose, proper use, and potential adverse effects.  -Extensively discussed pathophysiology of diabetes, recommended lifestyle  interventions, dietary effects on blood sugar control.   Written patient instructions provided. Patient verbalized understanding of treatment plan.  Total time in face to face counseling 42 minutes.    Follow-up:  Pharmacist follow via mychart in ~ 1 week and make small adjustments using remote CGM, Plan face-to-face in ~ 1 month to reassess appropriateness for surgery  PCP clinic visit in TBD Patient seen with Dr. Howell, DO

## 2024-01-16 NOTE — Assessment & Plan Note (Signed)
 Diabetes longstanding 17 years (2008) currently with worsened control based on recent A1C likely due to stress of shoulder pain and worry about surgery plan. Patient is able to provide the appropriate hypoglycemia management plan. Medication adherence appears good. Willing to restart CGM with ALARMS adjusted to minimize audible alarms. -Continued basal insulin  Lantus  (insulin  glargine) 4 units daily in the evening - Continued rapid insulin  Humalog  (insulin  lispro) to 4 to 8 units with meals (2-3x per day) - encouraged to use if pre-meal glucose is > 150 and low doses of 2 or 4 units with a meal are acceptable.  - Restarted CGM on phone with sample sensor.  - Continued GLP-1 Ozempic  (semaglutide ) 2 mg weekly  - Continued metformin  2000 mg -Patient educated on purpose, proper use, and potential adverse effects.  -Extensively discussed pathophysiology of diabetes, recommended lifestyle interventions, dietary effects on blood sugar control.

## 2024-01-16 NOTE — Patient Instructions (Signed)
 It was nice to see you today!  Enjoy your [place] sensor. Your goal blood sugar is 80-130 before eating and less than 180 after eating.  Medication Changes:  Please take your mealtime insulin  in doses of 2-8 units prior to each meal of the day IF your pre-meal glucose is 150 or higher.  Continue all other medication the same.   Keep up the good work with diet and exercise. Aim for a diet full of vegetables, fruit and lean meats (chicken, [place], fish). Try to limit salt intake by eating fresh or frozen vegetables (instead of canned), rinse canned vegetables prior to cooking and do not add any additional salt to meals.   I will look at your CGM readings and contact you by mychart in ~ 1 week.

## 2024-01-16 NOTE — Assessment & Plan Note (Signed)
>>  ASSESSMENT AND PLAN FOR DIABETES (HCC) WRITTEN ON 01/16/2024  2:34 PM BY KOVAL, PETER G, RPH-CPP  Diabetes longstanding 17 years (2008) currently with worsened control based on recent A1C likely due to stress of shoulder pain and worry about surgery plan. Patient is able to provide the appropriate hypoglycemia management plan. Medication adherence appears good. Willing to restart CGM with ALARMS adjusted to minimize audible alarms. -Continued basal insulin  Lantus  (insulin  glargine) 4 units daily in the evening - Continued rapid insulin  Humalog  (insulin  lispro) to 4 to 8 units with meals (2-3x per day) - encouraged to use if pre-meal glucose is > 150 and low doses of 2 or 4 units with a meal are acceptable.  - Restarted CGM on phone with sample sensor.  - Continued GLP-1 Ozempic  (semaglutide ) 2 mg weekly  - Continued metformin  2000 mg -Patient educated on purpose, proper use, and potential adverse effects.  -Extensively discussed pathophysiology of diabetes, recommended lifestyle interventions, dietary effects on blood sugar control.

## 2024-01-17 NOTE — Progress Notes (Signed)
 Reviewed and agree with Dr Macky Lower plan.

## 2024-01-18 DIAGNOSIS — K080 Exfoliation of teeth due to systemic causes: Secondary | ICD-10-CM | POA: Diagnosis not present

## 2024-01-23 ENCOUNTER — Encounter: Payer: Self-pay | Admitting: Pharmacist

## 2024-01-30 ENCOUNTER — Encounter: Payer: Self-pay | Admitting: Pharmacist

## 2024-01-31 NOTE — Telephone Encounter (Signed)
 Reviewed and agree with Dr Macky Lower plan.

## 2024-02-01 ENCOUNTER — Encounter: Payer: Self-pay | Admitting: Pharmacist

## 2024-02-01 DIAGNOSIS — E119 Type 2 diabetes mellitus without complications: Secondary | ICD-10-CM | POA: Diagnosis not present

## 2024-02-01 NOTE — Progress Notes (Signed)
 Patient contacted by MyChart Messaging to discuss glucose control.  Overall, glucose control on CGM is very good with very few lows.  She reports needing additional sensors.   Provided with 2 sensors for pick-up at [facility] front desk.  Following instruction patient verbalized understanding of treatment plan.

## 2024-02-06 ENCOUNTER — Encounter: Payer: Self-pay | Admitting: Pharmacist

## 2024-02-07 NOTE — Telephone Encounter (Signed)
 Reviewed and agree with Dr Macky Lower plan.

## 2024-02-11 ENCOUNTER — Telehealth: Payer: Self-pay

## 2024-02-11 NOTE — Telephone Encounter (Signed)
 Patient calls nurse line reporting dental issue.  She reports last week she hit her mouth/front tooth on something. She reports the tooth is now loose and reports difficulty eating due to pain.   She reports she reached out to her dental provider, however was told she would have to pay out of pocket. She reports her insurance only covers cleanings.   She reports she has concerns being a diabetic and doesn't want the area to become infected while she waits to have it pulled.  She denies any fevers, chills or swelling.   Patient scheduled for tomorrow for evaluation.

## 2024-02-12 ENCOUNTER — Ambulatory Visit

## 2024-02-12 NOTE — Progress Notes (Deleted)
    SUBJECTIVE:   CHIEF COMPLAINT / HPI:   Alicia [name] is a 68 y.o. female presenting with dental concern. A sign language ***video interpreter was used throughout the whole of the visit.  Patient his her mouth/front tooth on something last week and is now experiencing difficulty eating due to pain and looseness of front tooth. Has not seen a dentist (would have to pay out of pocket).  Care Gaps - Urine Alb/Cr ratio - ordered today - Medicare AWV  PERTINENT  PMH / PSH: T2DM  OBJECTIVE:   There were no vitals taken for this visit.  ***  ASSESSMENT/PLAN:   Assessment & Plan Type 2 diabetes mellitus with other specified complication, with long-term current use of insulin  (HCC)      Alan Flies, MD [facility]

## 2024-02-13 ENCOUNTER — Ambulatory Visit (INDEPENDENT_AMBULATORY_CARE_PROVIDER_SITE_OTHER): Admitting: Family Medicine

## 2024-02-13 VITALS — BP 119/69 | HR 86 | Ht 64.0 in | Wt 140.0 lb

## 2024-02-13 DIAGNOSIS — K0889 Other specified disorders of teeth and supporting structures: Secondary | ICD-10-CM | POA: Diagnosis not present

## 2024-02-13 NOTE — Patient Instructions (Signed)
 [facility] [phone], [place], [place] [address] Phone: [phone]  I recommend following up with your dentist.  If you end up having to pay out-of-pocket I would consider checking with the above dentist office as they may be able to offer cheaper services.  In the meantime please reach out if you experience any difficulty swallowing, drooling, unable to breathe, or fevers or swelling in your gums.

## 2024-02-13 NOTE — Progress Notes (Signed)
    SUBJECTIVE:   CHIEF COMPLAINT / HPI:   Patient reports that about a month ago she was eating cereal and bed something the wrong way and her front tooth came loose and she was in a lot of pain She saw her dentist who recommended tooth extraction and false tooth placement.  However, this procedure was denied by her insurance and she is unable to pay for this out-of-pocket  She wanted to be checked out today to make sure she does not need any other treatment or have an infection She denies recent fevers, bleeding, drooling.  She has been able to swallow her saliva and keep down fluids and is breathing okay.  Denies significant swelling.  She does have some pain right over the tooth but otherwise feels okay, has been able to eat  ASL interpreter present for the visit  PERTINENT  PMH / PSH: T2DM  OBJECTIVE:   BP 119/69   Pulse 86   Ht 5' 4 (1.626 m)   Wt 140 lb (63.5 kg)   SpO2 99%   BMI 24.03 kg/m   General: NAD, pleasant, able to participate in exam Respiratory: No respiratory distress Skin: warm and dry, no rashes noted Psych: Normal affect and mood  Mouth: Central incisor on left upper appears displaced compared to its counterpart.  She does have some tenderness in her upper lip/jaw immediately over her incisor but otherwise is nontender throughout her jaw and face.  No obvious lesions or swelling/abscess noted in her mouth or around the tooth  ASSESSMENT/PLAN:   Assessment & Plan Loosening of tooth Recommend follow-up with dentist to see if they can potentially appealed the insurance decision.  I do feel that the procedure would be helpful for her given her continued symptoms and pain Do not see any evidence of infection today to suggest need for antibiotics Also provided contact information for a one-time history which historically has been able to offer cheaper services in case she does end up having to pay out-of-pocket Discussed return precautions    Payton Coward,  MD [facility]

## 2024-02-14 ENCOUNTER — Encounter: Payer: Self-pay | Admitting: Pharmacist

## 2024-02-14 DIAGNOSIS — K080 Exfoliation of teeth due to systemic causes: Secondary | ICD-10-CM | POA: Diagnosis not present

## 2024-02-19 ENCOUNTER — Other Ambulatory Visit: Payer: Self-pay | Admitting: *Deleted

## 2024-02-19 DIAGNOSIS — E119 Type 2 diabetes mellitus without complications: Secondary | ICD-10-CM

## 2024-02-19 MED ORDER — METFORMIN HCL 1000 MG PO TABS: 1000.0000 mg | ORAL_TABLET | Freq: Two times a day (BID) | ORAL | 1 refills | Status: AC

## 2024-02-19 MED ORDER — DILTIAZEM HCL 30 MG PO TABS: ORAL_TABLET | ORAL | 3 refills | Status: DC

## 2024-02-25 ENCOUNTER — Encounter: Payer: Self-pay | Admitting: Pharmacist

## 2024-02-25 ENCOUNTER — Other Ambulatory Visit: Payer: Self-pay | Admitting: Pharmacist

## 2024-02-25 ENCOUNTER — Other Ambulatory Visit: Payer: Self-pay | Admitting: Family Medicine

## 2024-02-25 DIAGNOSIS — E11311 Type 2 diabetes mellitus with unspecified diabetic retinopathy with macular edema: Secondary | ICD-10-CM

## 2024-02-25 MED ORDER — INSULIN GLARGINE 100 UNIT/ML SOLOSTAR PEN: 3.0000 [IU] | PEN_INJECTOR | Freq: Two times a day (BID) | SUBCUTANEOUS | Status: DC

## 2024-02-25 NOTE — Progress Notes (Signed)
 Communicated the following change via MyChart to patient.   Split Lantus  (insulin  glargine) from 6 units once daily to 3 units TWICE daily in attempt to reduce glucose variability.   Updated via no print only.

## 2024-02-29 ENCOUNTER — Other Ambulatory Visit: Payer: Self-pay | Admitting: *Deleted

## 2024-03-03 DIAGNOSIS — E119 Type 2 diabetes mellitus without complications: Secondary | ICD-10-CM | POA: Diagnosis not present

## 2024-03-04 ENCOUNTER — Other Ambulatory Visit: Payer: Self-pay | Admitting: Family Medicine

## 2024-03-04 ENCOUNTER — Telehealth: Payer: Self-pay

## 2024-03-04 ENCOUNTER — Other Ambulatory Visit: Payer: Self-pay

## 2024-03-04 DIAGNOSIS — E11311 Type 2 diabetes mellitus with unspecified diabetic retinopathy with macular edema: Secondary | ICD-10-CM

## 2024-03-04 MED ORDER — FAMOTIDINE 20 MG PO TABS: 20.0000 mg | ORAL_TABLET | Freq: Two times a day (BID) | ORAL | 1 refills | Status: AC

## 2024-03-04 NOTE — Telephone Encounter (Signed)
 Patient calls nurse line in regards to Lantus .   She reports she needs a refill on this. It appears we sent this over on 8/25.  She reports she has been by there several times without successes.   I called [facility] to inquire about Lantus , however with their new system I had to LVM.   Will await their return call.

## 2024-03-04 NOTE — Telephone Encounter (Signed)
 Received call from patients insurance requesting DEXA order.   They report good practice to have DEXA completed post fracture.   Patient has an apt with PCP on 9/15.  Can discuss then.

## 2024-03-05 ENCOUNTER — Telehealth: Payer: Self-pay | Admitting: Podiatry

## 2024-03-05 MED ORDER — DILTIAZEM HCL ER COATED BEADS 240 MG PO CP24: 240.0000 mg | ORAL_CAPSULE | Freq: Every day | ORAL | 1 refills | Status: AC

## 2024-03-05 NOTE — Telephone Encounter (Signed)
 Called and left message for patient to contact office back to r/s appointment to a later date or move to A. Regals schedule.

## 2024-03-06 ENCOUNTER — Encounter: Payer: Self-pay | Admitting: Pharmacist

## 2024-03-11 ENCOUNTER — Encounter: Payer: Self-pay | Admitting: Pharmacist

## 2024-03-11 ENCOUNTER — Ambulatory Visit: Admitting: Pharmacist

## 2024-03-11 VITALS — BP 122/65 | HR 79 | Wt 139.4 lb

## 2024-03-11 DIAGNOSIS — Z794 Long term (current) use of insulin: Secondary | ICD-10-CM

## 2024-03-11 DIAGNOSIS — E118 Type 2 diabetes mellitus with unspecified complications: Secondary | ICD-10-CM | POA: Diagnosis not present

## 2024-03-11 DIAGNOSIS — E11311 Type 2 diabetes mellitus with unspecified diabetic retinopathy with macular edema: Secondary | ICD-10-CM | POA: Diagnosis not present

## 2024-03-11 DIAGNOSIS — E782 Mixed hyperlipidemia: Secondary | ICD-10-CM

## 2024-03-11 DIAGNOSIS — I10 Essential (primary) hypertension: Secondary | ICD-10-CM

## 2024-03-11 DIAGNOSIS — E1169 Type 2 diabetes mellitus with other specified complication: Secondary | ICD-10-CM

## 2024-03-11 MED ORDER — LANTUS SOLOSTAR 100 UNIT/ML ~~LOC~~ SOPN
4.0000 [IU] | PEN_INJECTOR | Freq: Two times a day (BID) | SUBCUTANEOUS | 11 refills | Status: AC
Start: 2024-03-11 — End: ?

## 2024-03-11 MED ORDER — FREESTYLE LIBRE 3 PLUS SENSOR MISC: 11 refills | Status: DC

## 2024-03-11 MED ORDER — ONETOUCH ULTRA VI STRP: ORAL_STRIP | 2 refills | Status: DC

## 2024-03-11 MED ORDER — FLUTICASONE PROPIONATE 50 MCG/ACT NA SUSP: NASAL | 1 refills | Status: AC

## 2024-03-11 NOTE — Assessment & Plan Note (Signed)
 Hypertension longstanding currently controlled. Blood pressure goal of <130/80  mmHg. Medication adherence good.  - Continued carvedilol  25 mg BID, hydrochlorothiazide  12.5 mg daily, valsartan 160 mg daily

## 2024-03-11 NOTE — Patient Instructions (Signed)
 It was nice to see you today!  Your goal blood sugar is 80-130 before eating and less than 180 after eating.  Medication Changes:  Increase your long acting insulin  - Lantus  (insulin  glargine) to 4 units two times daily   Continue all other medication the same.   Try to make sure you check your phone regularly to see your blood sugar, especially when you don't eat a big meal. Try to check regularly when your blood sugar is less than 150 mg/dL.  Your finger stick blood glucose measurements may be 20-30 units apart from your [place] sensor. If you are ever feeling like your blood sugar is low trust your finger sticks, the sensor may be a few minutes behind.   Keep up the good work with diet and exercise. Aim for a diet full of vegetables, fruit and lean meats (chicken, [place], fish). Try to limit salt intake by eating fresh or frozen vegetables (instead of canned), rinse canned vegetables prior to cooking and do not add any additional salt to meals.

## 2024-03-11 NOTE — Assessment & Plan Note (Addendum)
>>  ASSESSMENT AND PLAN FOR TYPE 2 DIABETES MELLITUS WITH OPHTHALMIC COMPLICATION (HCC) WRITTEN ON 03/11/2024 11:57 AM BY Draeden Kellman G, RPH-CPP  Diabetes longstanding currently uncontrolled. Patient is able to verbalize appropriate hypoglycemia management plan. Medication adherence appears good. Control is suboptimal due to suboptimal medication regimen and inconsistent meal sizes, in addition to continuous shoulder pain. -Increased dose of basal insulin  Lantus  (insulin  glargine) (insulin  glargine) from 3 units BID to 4 units BID. - Continued Humalog  (insulin  lispro) 2-4 units with meals, metformin  1000 mg BID, Ozempic  (semaglutide ) 2 mg weekly - Discussed dietary habits as a contributory factor to variability in glucose readings, recommended consistent checking of Libre3+ sensors, especially when readings get below 150 mg/dL -Patient educated on purpose, proper use, and potential adverse effects.  -Extensively discussed pathophysiology of diabetes, recommended lifestyle interventions, dietary effects on blood sugar control.  -Counseled on s/sx of and management of hypoglycemia.  - Sent in refills of blood glucose test strips, and Libre 3+ sensors   >>ASSESSMENT AND PLAN FOR DIABETES (HCC) WRITTEN ON 03/11/2024  2:18 PM BY Lya Holben G, RPH-CPP  Diabetes longstanding currently uncontrolled. Patient is able to verbalize appropriate hypoglycemia management plan. Medication adherence appears good. Control is suboptimal due to suboptimal medication regimen and inconsistent meal sizes, in addition to continuous shoulder pain. -Increased dose of basal insulin  Lantus  (insulin  glargine) (insulin  glargine) from 3 units BID to 4 units BID. - Continued Humalog  (insulin  lispro) 2-4 units with meals, metformin  1000 mg BID, Ozempic  (semaglutide ) 2 mg weekly - Discussed dietary habits as a contributory factor to variability in glucose readings, recommended consistent checking of Libre3+ sensors, especially when  readings get below 150 mg/dL -Patient educated on purpose, proper use, and potential adverse effects.  -Extensively discussed pathophysiology of diabetes, recommended lifestyle interventions, dietary effects on blood sugar control.  -Counseled on s/sx of and management of hypoglycemia.  - Sent in refills of blood glucose test strips, and Libre 3+ sensors

## 2024-03-11 NOTE — Assessment & Plan Note (Signed)
 ASCVD risk - primary prevention in patient with diabetes. Last LDL is 90 not at goal of <70 mg/dL.  -Continued rosuvastatin  5 mg daily

## 2024-03-11 NOTE — Assessment & Plan Note (Signed)
 Diabetes longstanding currently uncontrolled. Patient is able to verbalize appropriate hypoglycemia management plan. Medication adherence appears good. Control is suboptimal due to suboptimal medication regimen and inconsistent meal sizes, in addition to continuous shoulder pain. -Increased dose of basal insulin  Lantus  (insulin  glargine) (insulin  glargine) from 3 units BID to 4 units BID. - Continued Humalog  (insulin  lispro) 2-4 units with meals, metformin  1000 mg BID, Ozempic  (semaglutide ) 2 mg weekly - Discussed dietary habits as a contributory factor to variability in glucose readings, recommended consistent checking of Libre3+ sensors, especially when readings get below 150 mg/dL -Patient educated on purpose, proper use, and potential adverse effects.  -Extensively discussed pathophysiology of diabetes, recommended lifestyle interventions, dietary effects on blood sugar control.  -Counseled on s/sx of and management of hypoglycemia.  - Sent in refills of blood glucose test strips, and Libre 3+ sensors

## 2024-03-11 NOTE — Progress Notes (Signed)
 S:     Chief Complaint  Patient presents with   Medication Management    T2DM   68 y.o. female who presents for diabetes evaluation, education, and management. Patient arrives in good spirits and presents without any assistance. Patient is accompanied by sign language interpretor, Amy, and an intern from [place], Lucie. Had tooth pulled last month that was bothering her, but now another tooth bothers her. Her shoulder is painful. Current pain level is 2-3/10, but when painful usually a 8-10/10 on pain scale. Waiting for surgeon to reschedule shoulder surgery based on DM management.   Reports that her children report she tends to be more forgetful or more irritable recently. Questions whether or not this is related to her gabapentin  use.   Patient was referred and last seen by Primary Care Provider, Dr. Romelle, on 02/13/24.  At last visit, patient complained of tooth loosening. Since this appointment has had the bothersome tooth extracted.   PMH is significant for diabetes, diabetic neuropathy and retinopathy, hearing loss, HTN, HLD, OSA.   Current diabetes medications include: Lantus  (insulin  glargine) 3 units BID, Humalog  (insulin  lispro) 2-4 units with meals, metformin  1000 mg BID, Ozempic  (semaglutide ) 2 mg weekly Current hypertension medications include: carvedilol  25 mg BID, hydrochlorothiazide  12.5 mg daily, valsartan 160 mg daily Current hyperlipidemia medications include: rosuvastatin  5 mg two times weekly (Wed, Sat)  Patient reports adherence to taking all medications as prescribed.   Do you feel that your medications are working for you? Yes  Insurance coverage: [employer]  Patient reports hypoglycemic events. These events scare her and she recovers with PB crackers, orange juice, or full sugar soda. Had an instance of very low sugar while driving and she got very confused while driving and got severely lost and did not know how she got there, this alarmed her and her family. Has  fallen down and hit her head after having low sugars.   Patient reports sugars go up and down a lot, and she cannot explain why. Sometimes sugars spike when she doesn't eat and drop after eatings.   Patient /reports nocturia (nighttime urination). ~3 (but this is her normal) Patient reports neuropathy (nerve pain). - Pain in big toe particularly when sugar is high  Patient denies visual changes. Patient reports self foot exams.   Patient reported dietary habits: Eats 3 meals/day, but does panic a little when sugar gets very high. Generally eats healthy foods. Breakfast: PB toast, grits Lunch: chicken salad sandwiches Dinner: baked chicken, green vegetables, biscuits Drinks: zero sugar beverages  O:   Review of Systems  Musculoskeletal:        Longstanding L shoulder pain - pending surgery  All other systems reviewed and are negative.   Physical Exam Vitals reviewed.  Constitutional:      Appearance: Normal appearance. She is normal weight.  Neurological:     Mental Status: She is alert.    BG finger stick reports:  - Sat: 150-256, 120-205, 71-103 - Sun: 199-261, 178-231 - Mon: 310-350, 99-145 - Tues: 189-231 BG are high when waking up (200-300s)   7 day average blood glucose: 217  Libre3 CGM Download today % Time CGM is active: 96% Average Glucose: 217 mg/dL Glucose Management Indicator: 8.5  Glucose Variability: 31.1% (goal <36%) Time in Goal:  - Time in range 70-180: 28% - Time above range: 71% - Time below range: 1% Observed patterns:  Lab Results  Component Value Date   HGBA1C 9.0 (A) 01/01/2024   Vitals:  03/11/24 1032  BP: 122/65  Pulse: 79  SpO2: 99%    Lipid Panel     Component Value Date/Time   CHOL 198 02/22/2023 0826   TRIG 78 02/22/2023 0826   HDL 94 02/22/2023 0826   CHOLHDL 2.1 02/22/2023 0826   CHOLHDL 2.3 07/28/2016 0936   VLDL 12 07/28/2016 0936   LDLCALC 90 02/22/2023 0826   LDLDIRECT 78 07/10/2013 1441    Clinical  Atherosclerotic Cardiovascular Disease (ASCVD): No  The 10-year ASCVD risk score (Arnett DK, et al., 2019) is: 21.3%   Values used to calculate the score:     Age: 28 years     Clincally relevant sex: Female     Is Non-Hispanic African American: Yes     Diabetic: Yes     Tobacco smoker: No     Systolic Blood Pressure: 122 mmHg     Is BP treated: Yes     HDL Cholesterol: 94 mg/dL     Total Cholesterol: 198 mg/dL   A/P: Diabetes longstanding currently uncontrolled. Patient is able to verbalize appropriate hypoglycemia management plan. Medication adherence appears good. Control is suboptimal due to suboptimal medication regimen and inconsistent meal sizes, in addition to continuous shoulder pain. -Increased dose of basal insulin  Lantus  (insulin  glargine) (insulin  glargine) from 3 units BID to 4 units BID. - Continued Humalog  (insulin  lispro) 2-4 units with meals, metformin  1000 mg BID, Ozempic  (semaglutide ) 2 mg weekly - Discussed dietary habits as a contributory factor to variability in glucose readings, recommended consistent checking of Libre3+ sensors, especially when readings get below 150 mg/dL -Patient educated on purpose, proper use, and potential adverse effects.  -Extensively discussed pathophysiology of diabetes, recommended lifestyle interventions, dietary effects on blood sugar control.  -Counseled on s/sx of and management of hypoglycemia.  - Sent in refills of blood glucose test strips, and Libre 3+ sensors  ASCVD risk - primary prevention in patient with diabetes. Last LDL is 90 not at goal of <70 mg/dL.  -Continued rosuvastatin  5 mg twice weekly   Hypertension longstanding currently controlled. Blood pressure goal of <130/80  mmHg. Medication adherence good.  - Continued carvedilol  25 mg BID, hydrochlorothiazide  12.5 mg daily, valsartan 160 mg daily  Patient reported and requested refill of fluticasone  nasal spray.   Written patient instructions provided. Patient  verbalized understanding of treatment plan.  Total time in face to face counseling 43 minutes.    Follow-up:  PCP clinic visit on 03/17/24  Patient seen with Estefana Blase, PharmD Candidate - PY4 student.

## 2024-03-14 ENCOUNTER — Ambulatory Visit: Payer: Self-pay

## 2024-03-14 NOTE — Progress Notes (Signed)
 Reviewed and agree with Dr Rennis plan.

## 2024-03-17 ENCOUNTER — Ambulatory Visit: Admitting: Pharmacist

## 2024-03-17 ENCOUNTER — Ambulatory Visit

## 2024-03-17 VITALS — BP 160/85 | HR 82 | Ht 64.0 in | Wt 136.8 lb

## 2024-03-17 DIAGNOSIS — Z794 Long term (current) use of insulin: Secondary | ICD-10-CM

## 2024-03-17 DIAGNOSIS — E118 Type 2 diabetes mellitus with unspecified complications: Secondary | ICD-10-CM | POA: Diagnosis not present

## 2024-03-17 DIAGNOSIS — M8580 Other specified disorders of bone density and structure, unspecified site: Secondary | ICD-10-CM

## 2024-03-17 NOTE — Progress Notes (Addendum)
    SUBJECTIVE:   CHIEF COMPLAINT / HPI:   Alicia [name] is a 68yo F who presents to the clinic for general follow up of chronic conditions. She shows me her CGM readings which are still in the 250s with recent changes by Dr. Koval. She states she eats very small meals frequently.  Recently she received a letter in the mail from her insurance that it is time for her annual DEXA scan and she would like to schedule this today.   PERTINENT  PMH / PSH: osteopenia, DMT2 uncontrolled on insulin   OBJECTIVE:   BP (!) 160/85   Pulse 82   Ht 5' 4 (1.626 m)   Wt 62.1 kg   SpO2 97%   BMI 23.48 kg/m   General: A&O, NAD HEENT: No sign of trauma, EOM grossly intact Cardiac: RRR, no m/r/g Respiratory: CTAB, normal WOB, no w/c/r GI: Soft, NTTP, non-distended, bowel sounds present Extremities: NTTP, no peripheral edema. Neuro: Normal gait, moves all four extremities appropriately. Psych: Appropriate mood and affect   ASSESSMENT/PLAN:   Assessment & Plan Osteopenia, unspecified location Confirmed on 2023 DEXA, due annually. - DEXA scan referral sent  Type 2 diabetes mellitus with complication, with long-term current use of insulin  (HCC) Continues to be uncontrolled. Recent visit with Dr. Koval 09/09 with the following insulin  adjustments: - Increased dose of basal insulin  Lantus  (insulin  glargine) (insulin  glargine) from 3 units BID to 4 units BID. - Continued Humalog  (insulin  lispro) 2-4 units with meals, metformin  1000 mg BID, Ozempic  (semaglutide ) 2 mg weekly - Discussed dietary habits as a contributory factor to variability in glucose readings, recommended consistent checking of Libre3+ sensors, especially when readings get below 150 mg/dL - At this time I would not like to change her regimen as it may need some time to kick in. I suspect her small meals are more junk food than healthy items, and that she is not eating on a schedule causing labile BG. Counseled on diabetic diet. Continue to  monitor.   Alicia Dixons, DO [facility]

## 2024-03-17 NOTE — Patient Instructions (Addendum)
 It was wonderful to see you today.  Please bring ALL of your medications with you to every visit.   Today we talked about:  Your high blood pressure. Please check your blood pressure at the same time in the same spot every day with feet flat on the floor and your arm at heart level on a counter.  Write these down and bring these in to see me in 2 weeks.  If your blood pressure is still elevated at home then I will change some of your blood pressure medications.  Your diabetes.  I do not want to change your regimen at this time since you just saw Dr. Koval.  Please continue your current diabetes regimen as appropriate.  Your osteopenia.  This is a weakening of your bones.  This was noticed 2 years ago on your last bone scan.  I have gone ahead and sent in the referral for your next bone scan.  They should be calling you with an appointment either this week or next week.  Thank you for choosing [facility].   Please call [phone] with any questions about today's appointment.  Please arrive at least 15 minutes prior to your scheduled appointments.   If you had blood work today, I will send you a MyChart message or a letter if results are normal. Otherwise, I will give you a call.   If you had a referral placed, they will call you to set up an appointment. Please give us  a call if you don't hear back in the next 2 weeks.   If you need additional refills before your next appointment, please call your pharmacy first.   You should follow up in our clinic in 2 weeks  Camie Dixons, DO Family Medicine

## 2024-03-19 ENCOUNTER — Encounter: Payer: Self-pay | Admitting: Pharmacist

## 2024-03-20 ENCOUNTER — Ambulatory Visit (INDEPENDENT_AMBULATORY_CARE_PROVIDER_SITE_OTHER)

## 2024-03-20 VITALS — Ht 64.0 in | Wt 136.0 lb

## 2024-03-20 DIAGNOSIS — E1142 Type 2 diabetes mellitus with diabetic polyneuropathy: Secondary | ICD-10-CM

## 2024-03-20 DIAGNOSIS — B351 Tinea unguium: Secondary | ICD-10-CM | POA: Diagnosis not present

## 2024-03-20 NOTE — Progress Notes (Signed)
 Subjective:  Patient ID: Alicia [name], female    DOB: 1955/11/03,  MRN: [id number]  Chief Complaint  Patient presents with   Nail Problem    Patient is here for routine foot care las A1C 9.0 01/2024    68 y.o. female presents with the above complaint. Patient presenting with pain related to dystrophic thickened elongated nails. Patient is unable to trim own nails related to nail dystrophy and/or mobility issues. Patient does not complain of hyperkeratotic lesions. Patient does have a history of T2DM.   Review of Systems: Negative except as noted in the HPI. Denies N/V/F/Ch.  Past Medical History:  Diagnosis Date   Anemia    Arthritis    back and knees   ARTHRITIS, KNEE 09/17/2007   Asthma 04/04/2010   pt states she does not have asthma   Cataract 01/07/2019   Closed fracture of distal end of left radius 11/01/2015   Complete deafness    Meningitis at age 26   Complication of anesthesia    Deaf    Diabetes mellitus    Diabetic neuropathy (HCC)    Dysrhythmia    Ganglion cyst 06/22/2011   Gastroparesis    GERD (gastroesophageal reflux disease)    H/O: C-section    Hyperlipidemia    Hypertension    PONV (postoperative nausea and vomiting)    PSVT (paroxysmal supraventricular tachycardia) (HCC) 11/09/2017   Event monitor 09/14/2017 - Predominantly sinus rhythm with episodes of narrow-complex tachycardia suggestive of paroxysmal supraventricular tachycardia.   S/P appy     Current Outpatient Medications:    ACCU-CHEK SOFTCLIX LANCETS lancets, TEST 4 TIMES A DAY, Disp: 200 each, Rfl: 0   acetaminophen  (TYLENOL ) 650 MG CR tablet, Take 2 tablets (1,300 mg total) by mouth every 8 (eight) hours as needed for pain., Disp: 120 tablet, Rfl: 3   aspirin 81 MG chewable tablet, Chew 81 mg by mouth daily., Disp: , Rfl:    carvedilol  (COREG ) 25 MG tablet, TAKE 1 TABLET BY MOUTH TWICE A DAY., Disp: 180 tablet, Rfl: 3   cholecalciferol  (VITAMIN D ) 1000 UNITS tablet, Take 1,000 Units by  mouth daily., Disp: , Rfl:    Continuous Glucose Sensor (FREESTYLE LIBRE 3 PLUS SENSOR) MISC, Change sensor every 15 days., Disp: 2 each, Rfl: 11   Cyanocobalamin  (VITAMIN B 12) 500 MCG TABS, Take 500 mcg by mouth daily at 6 (six) AM., Disp: , Rfl:    diclofenac  Sodium (VOLTAREN ) 1 % GEL, Apply 2 g topically 4 (four) times daily. Rub into affected area of foot 2 to 4 times daily, Disp: 100 g, Rfl: 2   diltiazem  (CARDIZEM  CD) 240 MG 24 hr capsule, Take 1 capsule (240 mg total) by mouth daily., Disp: 90 capsule, Rfl: 1   diltiazem  (CARDIZEM ) 30 MG tablet, TAKE 1 TABLET BY MOUTH AS NEEDED (HEART RACING OR PALPITATIONS THAT LAST LONGER THAN 15 MINS)., Disp: 90 tablet, Rfl: 3   DULoxetine  (CYMBALTA ) 60 MG capsule, TAKE 1 CAPSULE BY MOUTH EVERY DAY, Disp: 90 capsule, Rfl: 2   famotidine  (PEPCID ) 20 MG tablet, Take 1 tablet (20 mg total) by mouth 2 (two) times daily., Disp: 180 tablet, Rfl: 1   fluticasone  (FLONASE ) 50 MCG/ACT nasal spray, 2 puffs each nostril daily, Disp: 48 mL, Rfl: 1   gabapentin  (NEURONTIN ) 800 MG tablet, Take 1 tablet (800 mg total) by mouth 2 (two) times daily., Disp: 180 tablet, Rfl: 0   glucose blood (ONETOUCH ULTRA) test strip, Use as instructed, Disp: 100 strip, Rfl: 2  glucose blood test strip, Use as instructed, Disp: 100 each, Rfl: 12   hydrochlorothiazide  (MICROZIDE ) 12.5 MG capsule, TAKE 1 CAPSULE BY MOUTH EVERY DAY, Disp: 90 capsule, Rfl: 1   insulin  glargine (LANTUS  SOLOSTAR) 100 UNIT/ML Solostar Pen, Inject 4 Units into the skin 2 (two) times daily., Disp: 15 mL, Rfl: 11   Insulin  Lispro w/ Trans Port (HUMALOG  TEMPO PEN) 100 UNIT/ML SOPN, INJECT 2-4 UNITS INTO THE SKIN 3 (THREE) TIMES DAILY WITH MEALS, Disp: 3 mL, Rfl: 6   Insulin  Pen Needle 31G X 5 MM MISC, 1 Container by Does not apply route once as needed for up to 1 dose., Disp: 100 each, Rfl: 11   metFORMIN  (GLUCOPHAGE ) 1000 MG tablet, Take 1 tablet (1,000 mg total) by mouth 2 (two) times daily., Disp: 180 tablet,  Rfl: 1   Olopatadine  HCl 0.2 % SOLN, Place 1 drop into both eyes daily., Disp: , Rfl:    pantoprazole  (PROTONIX ) 40 MG tablet, TAKE 1 TABLET BY MOUTH EVERY DAY, Disp: 90 tablet, Rfl: 1   rosuvastatin  (CRESTOR ) 5 MG tablet, TAKE 1 TABLET (5 MG TOTAL) BY MOUTH 2 TIMES A WEEK, Disp: 24 tablet, Rfl: 3   Semaglutide , 2 MG/DOSE, (OZEMPIC , 2 MG/DOSE,) 8 MG/3ML SOPN, Inject 2 mg into the skin once a week., Disp: 3 mL, Rfl: 3   valsartan (DIOVAN) 160 MG tablet, TAKE 1 TABLET BY MOUTH EVERYDAY AT BEDTIME, Disp: 90 tablet, Rfl: 1  Social History   Tobacco Use  Smoking Status Never   Passive exposure: Never  Smokeless Tobacco Never    Allergies  Allergen Reactions   Sulfonamide Derivatives Swelling and Rash    Lost BABY - Terrible itching.    Lipitor [Atorvastatin Calcium ] Other (See Comments)    Muscle Aches - Mild-Moderate - completely resolved with D/C of atorva.    Ramipril Cough        Trulicity  [Dulaglutide ] Nausea And Vomiting   Objective:  There were no vitals filed for this visit. Body mass index is 23.34 kg/m. Constitutional Well developed. Well nourished. Oriented to person, place, and time.  Vascular Dorsalis pedis pulses palpable bilaterally. Posterior tibial pulses palpable bilaterally. Capillary refill normal to all digits.  No cyanosis or clubbing noted. Pedal hair growth normal.  Neurologic Normal speech. Epicritic sensation to light touch decreased bilaterally. Negative tinel sign at tarsal tunnel bilaterally.   Dermatologic Skin texture and turgor are within normal limits.  No open wounds.  Hyperkeratotic lesions: Absent Nails: Pt has thick disfigured discolored nails with subungual debris noted bilateral entire nail hallux through fifth toenails.  Pain on palpation to the nails.  Musculoskeletal: No pain to palpation either foot. Rectus footshape. Ankle and major pedal joint ROM WNL.     Assessment:   1. Onychomycosis   2. Diabetic polyneuropathy  associated with type 2 diabetes mellitus (HCC)    Plan:  Patient was evaluated and treated and all questions answered.  Onychomycosis with pain  -Nails debrided as below. -Educated on self-care  Procedure: Nail Debridement Rationale: pain  Type of Debridement: manual, sharp debridement. Instrumentation: Nail nipper Number of Nails: 10  Procedures and Treatment: Consent by patient was obtained for treatment procedures. The patient understood the discussion of treatment and procedures well. All questions were answered thoroughly reviewed. Debridement of mycotic and hypertrophic toenails, 1 through 5 bilateral and clearing of subungual debris. No ulceration, no infection noted.     Return in about 3 months (around 06/19/2024).  Prentice Ovens, DPM AACFAS Fellowship Trained Podiatric  Surgeon [facility]

## 2024-03-21 ENCOUNTER — Ambulatory Visit: Admitting: Podiatry

## 2024-03-26 DIAGNOSIS — Z7982 Long term (current) use of aspirin: Secondary | ICD-10-CM | POA: Diagnosis not present

## 2024-03-26 DIAGNOSIS — Z794 Long term (current) use of insulin: Secondary | ICD-10-CM | POA: Diagnosis not present

## 2024-03-29 ENCOUNTER — Other Ambulatory Visit: Payer: Self-pay | Admitting: Podiatry

## 2024-03-31 ENCOUNTER — Telehealth: Payer: Self-pay

## 2024-03-31 DIAGNOSIS — M8580 Other specified disorders of bone density and structure, unspecified site: Secondary | ICD-10-CM

## 2024-03-31 NOTE — Telephone Encounter (Signed)
 Received call from Franky OLIPHANT pharmacist regarding patient.   He advises that patient is due to DEXA scan.   Per chart review, this was ordered on 03/20/24, however, order location was [facility]. This location does not perform Dexa scans. I have pended with the [facility] location.   Forwarding to PCP to D/c previous order and resend to this location.   Chiquita JAYSON English, RN

## 2024-04-01 ENCOUNTER — Encounter: Payer: Self-pay | Admitting: Pharmacist

## 2024-04-02 DIAGNOSIS — E119 Type 2 diabetes mellitus without complications: Secondary | ICD-10-CM | POA: Diagnosis not present

## 2024-04-02 NOTE — Progress Notes (Signed)
 Alicia [name]                                          MRN: [id number]   04/02/2024   The [facility] Quality Team Specialist reviewed this patient medical record for the purposes of chart review for care gap closure. The following were reviewed: chart review for care gap closure-kidney health evaluation for diabetes:eGFR  and uACR.    [facility]BCI Quality Team

## 2024-04-02 NOTE — Telephone Encounter (Signed)
 [employer] returned call to nurse line.   Advised of order placement. They are reaching out to patient to let her know that this has been completed and of location.   Chiquita JAYSON English, RN

## 2024-04-03 ENCOUNTER — Other Ambulatory Visit: Payer: Self-pay | Admitting: Physician Assistant

## 2024-04-03 MED ORDER — CARVEDILOL 25 MG PO TABS: ORAL_TABLET | ORAL | 1 refills | Status: AC

## 2024-04-07 ENCOUNTER — Other Ambulatory Visit: Payer: Self-pay | Admitting: Podiatry

## 2024-04-07 ENCOUNTER — Other Ambulatory Visit: Payer: Self-pay | Admitting: Family Medicine

## 2024-04-07 DIAGNOSIS — E11311 Type 2 diabetes mellitus with unspecified diabetic retinopathy with macular edema: Secondary | ICD-10-CM

## 2024-04-08 ENCOUNTER — Other Ambulatory Visit: Payer: Self-pay | Admitting: *Deleted

## 2024-04-08 DIAGNOSIS — E1169 Type 2 diabetes mellitus with other specified complication: Secondary | ICD-10-CM

## 2024-04-10 ENCOUNTER — Other Ambulatory Visit: Payer: Self-pay

## 2024-04-10 ENCOUNTER — Encounter: Payer: Self-pay | Admitting: Pharmacist

## 2024-04-10 DIAGNOSIS — S42202A Unspecified fracture of upper end of left humerus, initial encounter for closed fracture: Secondary | ICD-10-CM | POA: Diagnosis not present

## 2024-04-10 DIAGNOSIS — E1169 Type 2 diabetes mellitus with other specified complication: Secondary | ICD-10-CM

## 2024-04-10 MED ORDER — PANTOPRAZOLE SODIUM 40 MG PO TBEC: 40.0000 mg | DELAYED_RELEASE_TABLET | Freq: Every day | ORAL | 0 refills | Status: AC

## 2024-04-11 NOTE — Telephone Encounter (Signed)
 Reviewed and agree with Dr Rennis plan.

## 2024-04-13 ENCOUNTER — Other Ambulatory Visit: Payer: Self-pay | Admitting: Cardiology

## 2024-04-15 ENCOUNTER — Encounter: Payer: Self-pay | Admitting: Pharmacist

## 2024-04-15 NOTE — Progress Notes (Signed)
 Contacted patient with deafness via mychart message and communicated short-term adjustment of Lantus  (insulin  glargine) from 4 units BID to 5 units BID following recent steroid injection in shoulder.   Glucose readings are elevated on CGM with average > 250 since steroid injection.  Patient aware to decrease dose back to 4 units of Lantus  (insulin  glargine) once readings are less than 150mg /dl.

## 2024-04-17 ENCOUNTER — Telehealth: Payer: Self-pay

## 2024-04-17 NOTE — Telephone Encounter (Signed)
 Patient LVM on nurse line requesting a call back to discuss DEXA location.   I attempted to call patient back, however no answer and VM box full.   The order has been placed for [facility]. She can call centralized scheduling to get this scheduled at [phone].

## 2024-04-21 ENCOUNTER — Encounter: Payer: Self-pay | Admitting: Pharmacist

## 2024-04-21 DIAGNOSIS — E113512 Type 2 diabetes mellitus with proliferative diabetic retinopathy with macular edema, left eye: Secondary | ICD-10-CM | POA: Diagnosis not present

## 2024-04-21 DIAGNOSIS — H43392 Other vitreous opacities, left eye: Secondary | ICD-10-CM | POA: Diagnosis not present

## 2024-04-21 DIAGNOSIS — H35033 Hypertensive retinopathy, bilateral: Secondary | ICD-10-CM | POA: Diagnosis not present

## 2024-04-21 DIAGNOSIS — H43813 Vitreous degeneration, bilateral: Secondary | ICD-10-CM | POA: Diagnosis not present

## 2024-04-21 DIAGNOSIS — H35373 Puckering of macula, bilateral: Secondary | ICD-10-CM | POA: Diagnosis not present

## 2024-04-21 NOTE — Telephone Encounter (Signed)
 New number for DEXA scheduling given to patient.   [facility]- [phone].

## 2024-04-22 ENCOUNTER — Encounter: Payer: Self-pay | Admitting: Pharmacist

## 2024-04-28 ENCOUNTER — Telehealth: Payer: Self-pay

## 2024-04-28 NOTE — Telephone Encounter (Signed)
 Patient was identified as falling into the [place] Measure - Diabetes.   Patient was: Appointment already scheduled for:  05/06/24.

## 2024-04-29 ENCOUNTER — Ambulatory Visit: Admitting: Family Medicine

## 2024-04-29 NOTE — Progress Notes (Deleted)
   Alicia Ileana Collet, PhD, LAT, ATC acting as a scribe for Artist Lloyd, MD.  Alicia [name] is a 68 y.o. female who presents to [facility] today for exacerbation of her hip and low back pain. Pt was given a L GT steroid injection on 10/28/21. Last lumbar ESI, 11/01/21.  Today, pt reports ***  Dx testing: 10/28/21 L hip XR  02/22/21 L-spine & bilat hips/pelvis XR  Pertinent review of systems: ***  Relevant historical information: ***   Exam:  There were no vitals taken for this visit. General: Well Developed, well nourished, and in no acute distress.   MSK: ***    Lab and Radiology Results No results found. However, due to the size of the patient record, not all encounters were searched. Please check Results Review for a complete set of results. No results found.     Assessment and Plan: 68 y.o. female with ***   PDMP not reviewed this encounter. No orders of the defined types were placed in this encounter.  No orders of the defined types were placed in this encounter.    Discussed warning signs or symptoms. Please see discharge instructions. Patient expresses understanding.   ***

## 2024-05-01 ENCOUNTER — Other Ambulatory Visit (HOSPITAL_BASED_OUTPATIENT_CLINIC_OR_DEPARTMENT_OTHER)

## 2024-05-03 DIAGNOSIS — E119 Type 2 diabetes mellitus without complications: Secondary | ICD-10-CM | POA: Diagnosis not present

## 2024-05-06 ENCOUNTER — Ambulatory Visit (INDEPENDENT_AMBULATORY_CARE_PROVIDER_SITE_OTHER): Admitting: Family Medicine

## 2024-05-06 ENCOUNTER — Ambulatory Visit: Admitting: Pharmacist

## 2024-05-06 ENCOUNTER — Encounter: Payer: Self-pay | Admitting: Family Medicine

## 2024-05-06 VITALS — BP 126/64 | HR 85 | Wt 134.2 lb

## 2024-05-06 DIAGNOSIS — E11311 Type 2 diabetes mellitus with unspecified diabetic retinopathy with macular edema: Secondary | ICD-10-CM | POA: Diagnosis not present

## 2024-05-06 DIAGNOSIS — R1013 Epigastric pain: Secondary | ICD-10-CM

## 2024-05-06 DIAGNOSIS — R32 Unspecified urinary incontinence: Secondary | ICD-10-CM | POA: Diagnosis not present

## 2024-05-06 DIAGNOSIS — K582 Mixed irritable bowel syndrome: Secondary | ICD-10-CM | POA: Diagnosis not present

## 2024-05-06 LAB — POCT GLYCOSYLATED HEMOGLOBIN (HGB A1C): HbA1c, POC (controlled diabetic range): 9.8 % — AB (ref 0.0–7.0)

## 2024-05-06 NOTE — Assessment & Plan Note (Addendum)
 A1c worse at 9.8 today. However, patient appears to be experiencing hypoglycemic episodes based on sx and CGM report. Will continue current regimen for now, patient is in good communication with Dr. Koval who monitors sugar readings and provides recommendations via MyChart. Scheduled for 2 week follow up. - Microalbumin / creatinine urine ratio

## 2024-05-06 NOTE — Patient Instructions (Addendum)
 Thank you for coming in today! Here is a summary of what we discussed:  - I am sending a referral to the GI doctors to get more workup for your abdominal pain. We will also see you in our clinic in 2 weeks.  - We will keep your diabetes medications this for now.  Dr. Koval will keep an eye on your readings and message you with recommendations. Please remember to eat regularly to avoid lows!   We are checking some labs today. If they are abnormal, I will call you. If they are normal, I will send you a MyChart message (if it is active) or a letter in the mail. If you do not hear about your labs in the next 2 weeks, please call the office.   Please call the clinic at [phone] if your symptoms worsen or you have any concerns.  Best, Dr Adele

## 2024-05-06 NOTE — Progress Notes (Signed)
    SUBJECTIVE:   CHIEF COMPLAINT / HPI:   Type 2 DM --last A1c 9.0 in July --sugars in the 150s-250s, has 1% low readings. States she forgets to eat sometimes --DM regimen: metformin  1000mg  BID, lantus  6-8 units, humolog sliding scale (2-4 units with meals), ozempic  2mg  weekly --feels she gets shaky/jerking and drops things, this has been going on for a few months, feels it is getting worse. Wonders if it's associated with low blood sugars. No numbness or tingling  Abdominal pain --has been going on for about 3 weeks  --primarily just below ribcage --pain occurs randomly every so often, no association with eating or laying flat --no dysuria or frequency  --no injuries or muscle strain --feels deeper down --doesn't feel like acid reflux --does feel like she has early satiety, stuck feeling in throat --no medication changes --no N/V --lots of diarrhea last month, has IBS with mainly diarrhea --does not see GI regularly, just for colonoscopies  Urinary incontinence --reports several episodes of urinating on herself, usually when laughing or straining --no numbness in GU area  COVID 19 vaccine- Got it 3 months ago at [facility]  PERTINENT  PMH / PSH: MDD, OSA, GERD, IBS,   OBJECTIVE:   BP 126/64   Pulse 85   Wt 134 lb 3.2 oz (60.9 kg)   SpO2 99%   BMI 23.04 kg/m   General: Awake and conversant, no acute distress Pulm: normal work of breathing on room air Abd: normoactive BS, soft, nontender, nondistended. No rebound or guarding. No skin lesions on abdomen. 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 However, patient appears to be experiencing  hypoglycemic episodes based on sx and CGM report. Will continue current regimen for now, patient is in good communication with Dr. Koval who monitors sugar readings and provides recommendations via MyChart. Scheduled for 2 week follow up. - Microalbumin / creatinine urine ratio Epigastric pain 3 weeks of epigastric abdominal pain with no clear trigger, does not sound like GERD or GE. Pt has underlying IBS with primarily diarrhea, does not appear bowel habits have changed from baseline.   Last colonoscopy completed in 2023- was advised to repeat in 6 months due to poor prep. Does not appear that this was done.  Placed referral to GI for possible repeat colonoscopy and concern for gastroparesis given report of early satiety and poorly controlled DM. Close f/u scheduled here as well.  1. 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 Sounds like stress incontinence. Will check UA to ensure no UTI. Recommend following up at next visit. - POCT urinalysis dipstick Irritable bowel syndrome with both constipation and diarrhea   Pt unable to void today- instructed to bring urine sample back to lab another time.   Rea Raring, MD [facility]

## 2024-05-13 ENCOUNTER — Ambulatory Visit (HOSPITAL_BASED_OUTPATIENT_CLINIC_OR_DEPARTMENT_OTHER): Payer: Self-pay | Admitting: Family Medicine

## 2024-05-13 LAB — POCT URINALYSIS DIP (MANUAL ENTRY)
Bilirubin, UA: NEGATIVE
Glucose, UA: NEGATIVE mg/dL
Ketones, POC UA: NEGATIVE mg/dL
Nitrite, UA: NEGATIVE
Protein Ur, POC: 30 mg/dL — AB
Spec Grav, UA: 1.02 (ref 1.010–1.025)
Urobilinogen, UA: 0.2 U/dL
pH, UA: 5.5 (ref 5.0–8.0)

## 2024-05-13 LAB — POCT UA - MICROSCOPIC ONLY: WBC, Ur, HPF, POC: >20 (ref 0–5)

## 2024-05-13 NOTE — Addendum Note (Signed)
 Addended by: Janeann Paisley L on: 05/13/2024 12:01 PM   Modules accepted: Orders

## 2024-05-14 LAB — MICROALBUMIN / CREATININE URINE RATIO
Creatinine, Urine: 82.6 mg/dL
Microalb/Creat Ratio: 108 mg/g{creat} — ABNORMAL HIGH (ref 0–29)
Microalbumin, Urine: 89.4 ug/mL

## 2024-05-19 ENCOUNTER — Encounter: Payer: Self-pay | Admitting: Pharmacist

## 2024-05-20 ENCOUNTER — Ambulatory Visit (INDEPENDENT_AMBULATORY_CARE_PROVIDER_SITE_OTHER): Payer: Self-pay | Admitting: Family Medicine

## 2024-05-20 ENCOUNTER — Encounter: Payer: Self-pay | Admitting: Family Medicine

## 2024-05-20 VITALS — BP 133/70 | HR 88 | Wt 131.0 lb

## 2024-05-20 DIAGNOSIS — E11311 Type 2 diabetes mellitus with unspecified diabetic retinopathy with macular edema: Secondary | ICD-10-CM

## 2024-05-20 DIAGNOSIS — R011 Cardiac murmur, unspecified: Secondary | ICD-10-CM

## 2024-05-20 DIAGNOSIS — N3000 Acute cystitis without hematuria: Secondary | ICD-10-CM | POA: Diagnosis not present

## 2024-05-20 DIAGNOSIS — M159 Polyosteoarthritis, unspecified: Secondary | ICD-10-CM | POA: Diagnosis not present

## 2024-05-20 MED ORDER — HUMALOG TEMPO PEN 100 UNIT/ML ~~LOC~~ SOPN: PEN_INJECTOR | SUBCUTANEOUS | 6 refills | Status: DC

## 2024-05-20 MED ORDER — DULOXETINE HCL 60 MG PO CPEP: 60.0000 mg | ORAL_CAPSULE | Freq: Every day | ORAL | 2 refills | Status: AC

## 2024-05-20 MED ORDER — NITROFURANTOIN MONOHYD MACRO 100 MG PO CAPS: 100.0000 mg | ORAL_CAPSULE | Freq: Two times a day (BID) | ORAL | 0 refills | Status: AC

## 2024-05-20 NOTE — Progress Notes (Signed)
    SUBJECTIVE:   CHIEF COMPLAINT / HPI:   DMII:  Doing better since libre. Taking medications as prescribed. Has been talking with dr amalia over message.   Incontinence: - feels like she has to pee, but does not have any pee - increased urinary frequency - bubbling seen in the toilet, reports urine is normal in color and no blood  - does sometimes have some urinary trickling when she laughs or sneezes, not every time - wakes up in the morning with her bladder full, like she has to go   One episode of right sided leg swelling - got better on its own the next day - denies any calf pain or swelling    PERTINENT  PMH / PSH:  - HTN - OSA - IBS D/C - GERD - hyperthyroidism  - hearing loss   OBJECTIVE:   BP 133/70   Pulse 88   Wt 131 lb (59.4 kg)   SpO2 99%   BMI 22.49 kg/m   General: A&O, NAD HEENT: No sign of trauma, EOM grossly intact Cardiac: RRR, systolic murmur on exam  Respiratory: CTAB, normal WOB, no w/c/r GI: Soft, NTTP, non-distended  Extremities: NTTP, no peripheral edema. Neuro: Normal gait, moves all four extremities appropriately. 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 Koval for management. Reports sugars have been okay with less lows. Dr. Koval came in room during visit and recommended no changes to current regimen. - continue humalog  2-4 units with meals, lantus  4 units BID, metformin  1000mg  BID, and ozempic  2 mg weekly  - follow up with dr amalia - ordered humalog  pen as patient prefers these over vial Acute cystitis without hematuria Prior UA and microscopy with symptoms concerning for UTI. Patient unable to leave urine sample for culture today. Will treated with nitrofurantion and have her follow up as needed for symptoms.  Discussed if urgency continues to return for further evaluation.  Systolic murmur Systolic murmur on exam. Previously has been documented but many years ago. On review of prior visits, this has not been documented. Discussed with patient today. She denies chest pain, trouble breathing, or palpations. She reports history of murmur that she follows with cardiology for. Encouraged patient to make appointment with cardiology to follow up. Reports she had appointment in may for surgical clearance and was told everything was fine. I cannot find those results on Epic. Encouraged patient again to seek cardiology. She expressed understanding.    Follow up in 2-4 weeks   Gloriann Ogren, MD [facility]

## 2024-05-20 NOTE — Patient Instructions (Addendum)
 It was wonderful to see you today.  Please bring ALL of your medications with you to every visit.   Today we talked about:  Diabetes You're doing great!! No changes for now!   Urinary infection I am treating your urinary infection with an antibiotic. I also am going to try and send your urine to see exactly what bacteria you have.   Kidney stones Your urine was concerning for calcium  oxalate crystals.  This can occur due to having too much oxalate in your diet, which includes foods such as leafy greens, legumes, nuts. We treat this by increasing your fluid intake, limiting how much sodium (salt) you have in your diet and reducing nondairy animal protein in take. You can also increase your fruit and vegetable intake (just avoid things like spinach, rhubarb, and potatoes) These are long term changes we need to make. Do not limit your calcium !! We will follow regularly and monitor for any symptoms of these stones blocking your urinary tract.   Thank you for choosing [facility].   Please call [phone] with any questions about today's appointment.  Please arrive at least 15 minutes prior to your scheduled appointments.   If you had blood work today, I will send you a MyChart message or a letter if results are normal. Otherwise, I will give you a call.   If you had a referral placed, they will call you to set up an appointment. Please give us  a call if you don't hear back in the next 2 weeks.   If you need additional refills before your next appointment, please call your pharmacy first.   Do you need your medications delivered to your home?   We'll send your prescription to the [facility] for delivery.          Address: [address] [place], [place], [place] [address]          Phone: [phone]  Please call the [facility] to speak with a pharmacist and set up your home medication delivery. If you have any questions, feel free to contact us  --  we're happy to help!  Other [facility] that offer affordable prices on both prescriptions and over-the-counter items, as well as convenient services like vaccinations, are  [facility], at [facility]         Address:  [address], [place], [place] [address]         Phone: [phone]  [facility], located in the [facility]        Address: [address] [place], [place] [address]        Phone: [phone]  [facility], at [facility]       Address: [address], [place], [place] [address]       Phone: [phone]  [facility], at [facility]       Address: [address] Floor, [place], [place] [address]       Phone: [phone]  You should follow up in our clinic in [place] in about 4 weeks (around 06/17/2024) for follow up on urinary symptoms .  Gloriann Ogren, MD Family Medicine

## 2024-05-20 NOTE — Assessment & Plan Note (Signed)
 Patient following with Dr. Koval for management. Reports sugars have been okay with less lows. Dr. Koval came in room during visit and recommended no changes to current regimen. - continue humalog  2-4 units with meals, lantus  4 units BID, metformin  1000mg  BID, and ozempic  2 mg weekly  - follow up with dr amalia - ordered humalog  pen as patient prefers these over vial

## 2024-05-26 ENCOUNTER — Encounter: Payer: Self-pay | Admitting: Pharmacist

## 2024-06-02 ENCOUNTER — Other Ambulatory Visit: Payer: Self-pay | Admitting: *Deleted

## 2024-06-02 MED ORDER — VALSARTAN 160 MG PO TABS: 160.0000 mg | ORAL_TABLET | Freq: Every day | ORAL | 1 refills | Status: AC

## 2024-06-10 ENCOUNTER — Other Ambulatory Visit: Payer: Self-pay

## 2024-06-10 ENCOUNTER — Inpatient Hospital Stay (HOSPITAL_BASED_OUTPATIENT_CLINIC_OR_DEPARTMENT_OTHER): Admission: RE | Admit: 2024-06-10 | Discharge: 2024-06-10 | Attending: Family Medicine | Admitting: Family Medicine

## 2024-06-10 DIAGNOSIS — Z1231 Encounter for screening mammogram for malignant neoplasm of breast: Secondary | ICD-10-CM

## 2024-06-10 DIAGNOSIS — M8580 Other specified disorders of bone density and structure, unspecified site: Secondary | ICD-10-CM

## 2024-06-10 DIAGNOSIS — M810 Age-related osteoporosis without current pathological fracture: Secondary | ICD-10-CM | POA: Diagnosis not present

## 2024-06-10 DIAGNOSIS — Z780 Asymptomatic menopausal state: Secondary | ICD-10-CM | POA: Diagnosis not present

## 2024-06-12 ENCOUNTER — Ambulatory Visit

## 2024-06-13 ENCOUNTER — Ambulatory Visit: Payer: Self-pay

## 2024-06-19 ENCOUNTER — Ambulatory Visit

## 2024-06-19 ENCOUNTER — Encounter: Payer: Self-pay | Admitting: Pharmacist

## 2024-06-20 ENCOUNTER — Ambulatory Visit

## 2024-06-20 DIAGNOSIS — B351 Tinea unguium: Secondary | ICD-10-CM | POA: Diagnosis not present

## 2024-06-20 DIAGNOSIS — M79674 Pain in right toe(s): Secondary | ICD-10-CM | POA: Diagnosis not present

## 2024-06-20 DIAGNOSIS — M79675 Pain in left toe(s): Secondary | ICD-10-CM

## 2024-06-20 DIAGNOSIS — E1142 Type 2 diabetes mellitus with diabetic polyneuropathy: Secondary | ICD-10-CM

## 2024-06-20 NOTE — Progress Notes (Unsigned)
 RFC, neuropathy cream from [employer]

## 2024-06-24 ENCOUNTER — Other Ambulatory Visit: Payer: Self-pay

## 2024-06-25 MED ORDER — HYDROCHLOROTHIAZIDE 12.5 MG PO CAPS: 12.5000 mg | ORAL_CAPSULE | Freq: Every day | ORAL | 1 refills | Status: AC

## 2024-07-08 ENCOUNTER — Encounter: Payer: Self-pay | Admitting: Pharmacist

## 2024-07-08 ENCOUNTER — Telehealth: Payer: Self-pay

## 2024-07-08 NOTE — Telephone Encounter (Signed)
 Patient calls nurse line requesting an apt with PCP.   She reports her urine has been orange for ~ 1 week. She reports no other symptoms.   She denies any dysuria, hematuria, fevers, abdominal pain or changes in frequency. No blood.   She reports she takes B12 vitamins, however stopped and the color persisted.   Patient scheduled for 1/9 with PCP.   Precautions discussed with patient for sooner evaluation.

## 2024-07-09 ENCOUNTER — Other Ambulatory Visit: Payer: Self-pay | Admitting: Cardiology

## 2024-07-11 ENCOUNTER — Ambulatory Visit: Payer: Medicare (Managed Care)

## 2024-07-11 VITALS — BP 126/72 | HR 81 | Wt 132.8 lb

## 2024-07-11 DIAGNOSIS — R3989 Other symptoms and signs involving the genitourinary system: Secondary | ICD-10-CM | POA: Diagnosis not present

## 2024-07-11 DIAGNOSIS — R198 Other specified symptoms and signs involving the digestive system and abdomen: Secondary | ICD-10-CM

## 2024-07-11 DIAGNOSIS — R1011 Right upper quadrant pain: Secondary | ICD-10-CM | POA: Diagnosis not present

## 2024-07-11 DIAGNOSIS — R822 Biliuria: Secondary | ICD-10-CM

## 2024-07-11 LAB — POCT URINALYSIS DIP (MANUAL ENTRY)
Glucose, UA: NEGATIVE mg/dL
Ketones, POC UA: NEGATIVE mg/dL
Nitrite, UA: NEGATIVE
Protein Ur, POC: 100 mg/dL — AB
Spec Grav, UA: 1.025
Urobilinogen, UA: 1 U/dL
pH, UA: 5.5

## 2024-07-11 LAB — POCT UA - MICROSCOPIC ONLY: WBC, Ur, HPF, POC: >20

## 2024-07-11 MED ORDER — CEPHALEXIN 500 MG PO CAPS: 500.0000 mg | ORAL_CAPSULE | Freq: Two times a day (BID) | ORAL | 0 refills | Status: AC

## 2024-07-11 NOTE — Patient Instructions (Signed)
 It was wonderful to see you today.  Please bring ALL of your medications with you to every visit.   Today we talked about:  Your UTI. You have been prescribed an antibiotic called cephalexin  500mg . Take this twice a day for the next 7 days. I have sent your urine in for a culture, if this comes back needing to be treated with another antibiotic, I will call you.   Your RUQ/epigastric pain. We will get your labs collected to look at your liver enzymes today. I will contact you with the results. Please watch for a phone call from the imaging center to schedule your belly ultrasound.   Thank you for choosing [facility].   Please call [phone] with any questions about today's appointment.  Please arrive at least 15 minutes prior to your scheduled appointments.   If you had blood work today, I will send you a MyChart message or a letter if results are normal. Otherwise, I will give you a call.   If you had a referral placed, they will call you to set up an appointment. Please give us  a call if you don't hear back in the next 2 weeks.   If you need additional refills before your next appointment, please call your pharmacy first.   You should follow up in our clinic in as needed.   Camie Dixons, DO Family Medicine

## 2024-07-11 NOTE — Progress Notes (Signed)
" ° ° °  SUBJECTIVE:   CHIEF COMPLAINT / HPI:   Orange urine Began 2 weeks ago, not neon orange but not her normal light yellow to yellow Knew that B vitamins can cause urine color change so stopped all her vitamins and started drinking a lot more water but the orange did not go away  Reports diarrhea last week week that gradually resolved this week, intermittent nausea sometimes without food, sometimes worse after food Denies new foods or surplus of certain foods (sweet potatoes, beets, carrots), new medications, new creams or serums, dysuria, hematuria, foul smell, bubbles, back pain, vomiting  PERTINENT  PMH / PSH: HTN, GERD, T2DM  OBJECTIVE:   BP 126/72   Pulse 81   Wt 132 lb 12.8 oz (60.2 kg)   SpO2 99%   BMI 22.80 kg/m   General: A&O, NAD HEENT: No sign of trauma, EOM grossly intact Cardiac: RRR, no m/r/g Respiratory: CTAB, normal WOB, no w/c/r GI: Soft, mild R sided tenderness, non-distended, positive Murphy's sign Extremities: no peripheral edema. Neuro: Normal gait, moves all four extremities appropriately. Psych: Appropriate mood and affect   ASSESSMENT/PLAN:   Assessment & Plan Abnormal urine color +UTI on UA in office with many bacteria, >20 WBC, bilirubin, hgb and odd color to patient x2 weeks. Less concern for pyelonephritis without CVA tenderness or fevers.  - Urine culture - Keflex  500mg  BID x7 days Bilirubin in urine RUQ pain Positive Murphy's Sign Concern for liver/gb pathology with bilirubin in urine and positive Murphy's sign on exam +/- intermittent RUQ pain and nausea. On GLP-1 since 02/2023, no history of hepatitis or IVDU. - CBC, CMP, hepatic function panel today - RUQ US     Camie Dixons, DO [facility] "

## 2024-07-12 LAB — CBC WITH DIFFERENTIAL/PLATELET
Basophils Absolute: 0 x10E3/uL (ref 0.0–0.2)
Basos: 1 %
EOS (ABSOLUTE): 0.1 x10E3/uL (ref 0.0–0.4)
Eos: 2 %
Hematocrit: 32 % — ABNORMAL LOW (ref 34.0–46.6)
Hemoglobin: 10.6 g/dL — ABNORMAL LOW (ref 11.1–15.9)
Immature Grans (Abs): 0 x10E3/uL (ref 0.0–0.1)
Immature Granulocytes: 0 %
Lymphocytes Absolute: 1.7 x10E3/uL (ref 0.7–3.1)
Lymphs: 25 %
MCH: 29.9 pg (ref 26.6–33.0)
MCHC: 33.1 g/dL (ref 31.5–35.7)
MCV: 90 fL (ref 79–97)
Monocytes Absolute: 0.7 x10E3/uL (ref 0.1–0.9)
Monocytes: 10 %
Neutrophils Absolute: 4.2 x10E3/uL (ref 1.4–7.0)
Neutrophils: 62 %
Platelets: 377 x10E3/uL (ref 150–450)
RBC: 3.54 x10E6/uL — ABNORMAL LOW (ref 3.77–5.28)
RDW: 14.7 % (ref 11.7–15.4)
WBC: 6.7 x10E3/uL (ref 3.4–10.8)

## 2024-07-12 LAB — COMPREHENSIVE METABOLIC PANEL WITH GFR
ALT: 172 IU/L — ABNORMAL HIGH (ref 0–32)
AST: 138 IU/L — ABNORMAL HIGH (ref 0–40)
Albumin: 4.2 g/dL (ref 3.9–4.9)
Alkaline Phosphatase: 329 IU/L — ABNORMAL HIGH (ref 49–135)
BUN/Creatinine Ratio: 15 (ref 12–28)
BUN: 11 mg/dL (ref 8–27)
Bilirubin Total: 1.1 mg/dL (ref 0.0–1.2)
CO2: 22 mmol/L (ref 20–29)
Calcium: 10.6 mg/dL — ABNORMAL HIGH (ref 8.7–10.3)
Chloride: 99 mmol/L (ref 96–106)
Creatinine, Ser: 0.73 mg/dL (ref 0.57–1.00)
Globulin, Total: 2.8 g/dL (ref 1.5–4.5)
Glucose: 134 mg/dL — ABNORMAL HIGH (ref 70–99)
Potassium: 4 mmol/L (ref 3.5–5.2)
Sodium: 139 mmol/L (ref 134–144)
Total Protein: 7 g/dL (ref 6.0–8.5)
eGFR: 90 mL/min/1.73

## 2024-07-12 LAB — HEPATIC FUNCTION PANEL: Bilirubin, Direct: 0.77 mg/dL — ABNORMAL HIGH (ref 0.00–0.40)

## 2024-07-13 NOTE — Addendum Note (Signed)
 Addended byBETHA LUPIE CREDIT on: 07/13/2024 07:41 PM   Modules accepted: Orders

## 2024-07-15 ENCOUNTER — Ambulatory Visit: Payer: Self-pay

## 2024-07-15 ENCOUNTER — Other Ambulatory Visit: Payer: Medicare (Managed Care)

## 2024-07-15 DIAGNOSIS — R822 Biliuria: Secondary | ICD-10-CM

## 2024-07-15 DIAGNOSIS — R3989 Other symptoms and signs involving the genitourinary system: Secondary | ICD-10-CM

## 2024-07-15 DIAGNOSIS — R1011 Right upper quadrant pain: Secondary | ICD-10-CM

## 2024-07-15 DIAGNOSIS — R198 Other specified symptoms and signs involving the digestive system and abdomen: Secondary | ICD-10-CM

## 2024-07-15 LAB — URINE CULTURE

## 2024-07-16 LAB — BASIC METABOLIC PANEL WITH GFR
BUN/Creatinine Ratio: 16 (ref 12–28)
BUN: 11 mg/dL (ref 8–27)
CO2: 22 mmol/L (ref 20–29)
Calcium: 10.5 mg/dL — ABNORMAL HIGH (ref 8.7–10.3)
Chloride: 97 mmol/L (ref 96–106)
Creatinine, Ser: 0.7 mg/dL (ref 0.57–1.00)
Glucose: 102 mg/dL — ABNORMAL HIGH (ref 70–99)
Potassium: 3.4 mmol/L — ABNORMAL LOW (ref 3.5–5.2)
Sodium: 138 mmol/L (ref 134–144)
eGFR: 94 mL/min/1.73

## 2024-07-16 LAB — HEPATIC FUNCTION PANEL
ALT: 234 IU/L — ABNORMAL HIGH (ref 0–32)
AST: 168 IU/L — ABNORMAL HIGH (ref 0–40)
Albumin: 4.5 g/dL (ref 3.9–4.9)
Alkaline Phosphatase: 375 IU/L — ABNORMAL HIGH (ref 49–135)
Bilirubin Total: 2.3 mg/dL — ABNORMAL HIGH (ref 0.0–1.2)
Bilirubin, Direct: 1.8 mg/dL — ABNORMAL HIGH (ref 0.00–0.40)
Total Protein: 7.4 g/dL (ref 6.0–8.5)

## 2024-07-16 LAB — CBC WITH DIFFERENTIAL/PLATELET
Basophils Absolute: 0 x10E3/uL (ref 0.0–0.2)
Basos: 0 %
EOS (ABSOLUTE): 0.3 x10E3/uL (ref 0.0–0.4)
Eos: 4 %
Hematocrit: 34.5 % (ref 34.0–46.6)
Hemoglobin: 11 g/dL — ABNORMAL LOW (ref 11.1–15.9)
Immature Grans (Abs): 0 x10E3/uL (ref 0.0–0.1)
Immature Granulocytes: 0 %
Lymphocytes Absolute: 1.5 x10E3/uL (ref 0.7–3.1)
Lymphs: 21 %
MCH: 28.9 pg (ref 26.6–33.0)
MCHC: 31.9 g/dL (ref 31.5–35.7)
MCV: 91 fL (ref 79–97)
Monocytes Absolute: 0.6 x10E3/uL (ref 0.1–0.9)
Monocytes: 8 %
Neutrophils Absolute: 4.9 x10E3/uL (ref 1.4–7.0)
Neutrophils: 67 %
Platelets: 410 x10E3/uL (ref 150–450)
RBC: 3.8 x10E6/uL (ref 3.77–5.28)
RDW: 14.9 % (ref 11.7–15.4)
WBC: 7.3 x10E3/uL (ref 3.4–10.8)

## 2024-07-16 LAB — LACTATE DEHYDROGENASE: LDH: 200 IU/L (ref 119–226)

## 2024-07-16 LAB — HAPTOGLOBIN: Haptoglobin: 285 mg/dL (ref 37–355)

## 2024-07-16 LAB — FERRITIN: Ferritin: 17 ng/mL (ref 15–150)

## 2024-07-17 NOTE — Progress Notes (Signed)
 Alicia [name]                                          MRN: [id number]   07/17/2024   The [facility] Quality Team Specialist reviewed this patient medical record for the purposes of chart review for care gap closure. The following were reviewed: chart review for care gap closure-glycemic status assessment.    [facility] Quality Team

## 2024-07-22 ENCOUNTER — Ambulatory Visit
Admission: RE | Admit: 2024-07-22 | Discharge: 2024-07-22 | Disposition: A | Payer: Medicare (Managed Care) | Source: Ambulatory Visit | Attending: Family Medicine | Admitting: Family Medicine

## 2024-07-22 DIAGNOSIS — Z1231 Encounter for screening mammogram for malignant neoplasm of breast: Secondary | ICD-10-CM

## 2024-07-24 ENCOUNTER — Ambulatory Visit
Admission: RE | Admit: 2024-07-24 | Discharge: 2024-07-24 | Disposition: A | Discharge status: Home / Self Care | Payer: Medicare (Managed Care) | Source: Ambulatory Visit | Attending: Family Medicine | Admitting: Family Medicine

## 2024-07-24 DIAGNOSIS — R1011 Right upper quadrant pain: Secondary | ICD-10-CM

## 2024-07-24 DIAGNOSIS — R3989 Other symptoms and signs involving the genitourinary system: Secondary | ICD-10-CM

## 2024-07-25 ENCOUNTER — Inpatient Hospital Stay (HOSPITAL_COMMUNITY)
Admission: EM | Admit: 2024-07-25 | Discharge: 2024-07-30 | DRG: 445 | Disposition: A | Discharge status: Home / Self Care | Payer: Medicare (Managed Care) | Attending: Family Medicine | Admitting: Family Medicine

## 2024-07-25 ENCOUNTER — Telehealth: Payer: Self-pay

## 2024-07-25 ENCOUNTER — Encounter (HOSPITAL_COMMUNITY): Payer: Self-pay | Admitting: Emergency Medicine

## 2024-07-25 DIAGNOSIS — Z604 Social exclusion and rejection: Secondary | ICD-10-CM | POA: Diagnosis present

## 2024-07-25 DIAGNOSIS — H9193 Unspecified hearing loss, bilateral: Secondary | ICD-10-CM | POA: Diagnosis present

## 2024-07-25 DIAGNOSIS — K8020 Calculus of gallbladder without cholecystitis without obstruction: Secondary | ICD-10-CM | POA: Diagnosis present

## 2024-07-25 DIAGNOSIS — J45909 Unspecified asthma, uncomplicated: Secondary | ICD-10-CM | POA: Diagnosis present

## 2024-07-25 DIAGNOSIS — Z9049 Acquired absence of other specified parts of digestive tract: Secondary | ICD-10-CM

## 2024-07-25 DIAGNOSIS — K838 Other specified diseases of biliary tract: Secondary | ICD-10-CM

## 2024-07-25 DIAGNOSIS — E876 Hypokalemia: Secondary | ICD-10-CM | POA: Diagnosis present

## 2024-07-25 DIAGNOSIS — Z789 Other specified health status: Secondary | ICD-10-CM

## 2024-07-25 DIAGNOSIS — Z7984 Long term (current) use of oral hypoglycemic drugs: Secondary | ICD-10-CM

## 2024-07-25 DIAGNOSIS — Z794 Long term (current) use of insulin: Secondary | ICD-10-CM

## 2024-07-25 DIAGNOSIS — K3184 Gastroparesis: Secondary | ICD-10-CM | POA: Diagnosis present

## 2024-07-25 DIAGNOSIS — K219 Gastro-esophageal reflux disease without esophagitis: Secondary | ICD-10-CM | POA: Diagnosis present

## 2024-07-25 DIAGNOSIS — R7989 Other specified abnormal findings of blood chemistry: Principal | ICD-10-CM | POA: Insufficient documentation

## 2024-07-25 DIAGNOSIS — Z7985 Long-term (current) use of injectable non-insulin antidiabetic drugs: Secondary | ICD-10-CM

## 2024-07-25 DIAGNOSIS — R933 Abnormal findings on diagnostic imaging of other parts of digestive tract: Secondary | ICD-10-CM | POA: Insufficient documentation

## 2024-07-25 DIAGNOSIS — K819 Cholecystitis, unspecified: Secondary | ICD-10-CM

## 2024-07-25 DIAGNOSIS — E1165 Type 2 diabetes mellitus with hyperglycemia: Secondary | ICD-10-CM | POA: Diagnosis present

## 2024-07-25 DIAGNOSIS — R109 Unspecified abdominal pain: Secondary | ICD-10-CM

## 2024-07-25 DIAGNOSIS — Z8249 Family history of ischemic heart disease and other diseases of the circulatory system: Secondary | ICD-10-CM

## 2024-07-25 DIAGNOSIS — K8062 Calculus of gallbladder and bile duct with acute cholecystitis without obstruction: Principal | ICD-10-CM | POA: Diagnosis present

## 2024-07-25 DIAGNOSIS — F32A Depression, unspecified: Secondary | ICD-10-CM | POA: Diagnosis present

## 2024-07-25 DIAGNOSIS — E785 Hyperlipidemia, unspecified: Secondary | ICD-10-CM | POA: Diagnosis present

## 2024-07-25 DIAGNOSIS — N179 Acute kidney failure, unspecified: Secondary | ICD-10-CM | POA: Diagnosis not present

## 2024-07-25 DIAGNOSIS — E11311 Type 2 diabetes mellitus with unspecified diabetic retinopathy with macular edema: Secondary | ICD-10-CM

## 2024-07-25 DIAGNOSIS — Z7982 Long term (current) use of aspirin: Secondary | ICD-10-CM

## 2024-07-25 DIAGNOSIS — Z809 Family history of malignant neoplasm, unspecified: Secondary | ICD-10-CM

## 2024-07-25 DIAGNOSIS — E1143 Type 2 diabetes mellitus with diabetic autonomic (poly)neuropathy: Secondary | ICD-10-CM | POA: Diagnosis present

## 2024-07-25 DIAGNOSIS — E119 Type 2 diabetes mellitus without complications: Secondary | ICD-10-CM

## 2024-07-25 DIAGNOSIS — I10 Essential (primary) hypertension: Secondary | ICD-10-CM | POA: Diagnosis present

## 2024-07-25 DIAGNOSIS — Z833 Family history of diabetes mellitus: Secondary | ICD-10-CM

## 2024-07-25 DIAGNOSIS — K861 Other chronic pancreatitis: Secondary | ICD-10-CM | POA: Diagnosis present

## 2024-07-25 DIAGNOSIS — Z8661 Personal history of infections of the central nervous system: Secondary | ICD-10-CM

## 2024-07-25 LAB — COMPREHENSIVE METABOLIC PANEL WITH GFR
ALT: 658 U/L — ABNORMAL HIGH (ref 0–44)
AST: 370 U/L — ABNORMAL HIGH (ref 15–41)
Albumin: 4.2 g/dL (ref 3.5–5.0)
Alkaline Phosphatase: 265 U/L — ABNORMAL HIGH (ref 38–126)
Anion gap: 14 (ref 5–15)
BUN: 18 mg/dL (ref 8–23)
CO2: 27 mmol/L (ref 22–32)
Calcium: 9.9 mg/dL (ref 8.9–10.3)
Chloride: 97 mmol/L — ABNORMAL LOW (ref 98–111)
Creatinine, Ser: 0.98 mg/dL (ref 0.44–1.00)
GFR, Estimated: >60 mL/min
Glucose, Bld: 166 mg/dL — ABNORMAL HIGH (ref 70–99)
Potassium: 3.1 mmol/L — ABNORMAL LOW (ref 3.5–5.1)
Sodium: 138 mmol/L (ref 135–145)
Total Bilirubin: 4.4 mg/dL — ABNORMAL HIGH (ref 0.0–1.2)
Total Protein: 7.2 g/dL (ref 6.5–8.1)

## 2024-07-25 LAB — CBC
HCT: 32 % — ABNORMAL LOW (ref 36.0–46.0)
Hemoglobin: 10.3 g/dL — ABNORMAL LOW (ref 12.0–15.0)
MCH: 29.2 pg (ref 26.0–34.0)
MCHC: 32.2 g/dL (ref 30.0–36.0)
MCV: 90.7 fL (ref 80.0–100.0)
Platelets: 333 K/uL (ref 150–400)
RBC: 3.53 MIL/uL — ABNORMAL LOW (ref 3.87–5.11)
RDW: 15.9 % — ABNORMAL HIGH (ref 11.5–15.5)
WBC: 7 K/uL (ref 4.0–10.5)
nRBC: 0 % (ref 0.0–0.2)

## 2024-07-25 MED ORDER — MORPHINE SULFATE (PF) 4 MG/ML IV SOLN
4.0000 mg | Freq: Once | INTRAVENOUS | Status: AC
  Administered 2024-07-25: 4 mg via INTRAVENOUS
  Filled 2024-07-25: qty 1

## 2024-07-25 NOTE — ED Provider Notes (Incomplete)
 " [place] [facility] AT [facility] Provider Note   CSN: [id number] Arrival date & time: 07/25/24  8158     Patient presents with: No chief complaint on file.   Alicia [name] is a 69 y.o. female.  Patient with past medical history significant for type II DM, hearing loss, IBS, GERD, gastroparesis presents to the emergency department with concerns over intermittent right upper quadrant pain with nausea and vomiting.  Pain has been present for approximately 3 weeks.  She was evaluated as an outpatient and imaging and labs were concerning for possible gallbladder etiology.  She states that her nausea have subsided and she has intermittent right upper quadrant abdominal pain.  She denies diarrhea, fever, chest pain, shortness of breath Bedside visitor used as ASL interpreter   {Add pertinent medical, surgical, social history, OB history to HPI:32947} HPI     Prior to Admission medications  Medication Sig Start Date End Date Taking? Authorizing Provider  ACCU-CHEK SOFTCLIX LANCETS lancets TEST 4 TIMES A DAY 07/11/17   Lanny [name], MD  acetaminophen  (TYLENOL ) 650 MG CR tablet Take 2 tablets (1,300 mg total) by mouth every 8 (eight) hours as needed for pain. 11/26/20   Scarlet Elsie LABOR, MD  aspirin 81 MG chewable tablet Chew 81 mg by mouth daily.    [provider]  carvedilol  (COREG ) 25 MG tablet TAKE 1 TABLET BY MOUTH TWICE A DAY. 04/03/24   Parthenia Olivia HERO, PA-C  cholecalciferol  (VITAMIN D ) 1000 UNITS tablet Take 1,000 Units by mouth daily.    [provider]  Continuous Glucose Sensor (FREESTYLE LIBRE 3 PLUS SENSOR) MISC Change sensor every 15 days. 03/11/24   McDiarmid, Krystal BIRCH, MD  Cyanocobalamin  (VITAMIN B 12) 500 MCG TABS Take 500 mcg by mouth daily at 6 (six) AM.    [provider]  diclofenac  Sodium (VOLTAREN ) 1 % GEL Apply 2 g topically 4 (four) times daily. Rub into affected area of foot 2 to 4 times daily 09/18/19   Gershon Donnice JONELLE,  DPM  diltiazem  (CARDIZEM  CD) 240 MG 24 hr capsule Take 1 capsule (240 mg total) by mouth daily. 03/05/24   Majeed, Camie, DO  diltiazem  (CARDIZEM ) 30 MG tablet TAKE 1 TABLET BY MOUTH AS NEEDED (HEART RACING OR PALPITATIONS THAT LAST LONGER THAN 15 MINS). 02/19/24   Majeed, Camie, DO  DULoxetine  (CYMBALTA ) 60 MG capsule Take 1 capsule (60 mg total) by mouth daily. 05/20/24   Baloch, Mahnoor, MD  famotidine  (PEPCID ) 20 MG tablet Take 1 tablet (20 mg total) by mouth 2 (two) times daily. 03/04/24   Majeed, Camie, DO  fluticasone  (FLONASE ) 50 MCG/ACT nasal spray 2 puffs each nostril daily 03/11/24   McDiarmid, Krystal BIRCH, MD  gabapentin  (NEURONTIN ) 800 MG tablet TAKE 1 TABLET BY MOUTH TWICE A DAY 04/07/24   Gershon Donnice JONELLE, DPM  glucose blood (ONETOUCH ULTRA) test strip Use as instructed 03/11/24   McDiarmid, Krystal BIRCH, MD  glucose blood test strip Use as instructed 08/09/22   Jennelle Riis, MD  hydrochlorothiazide  (MICROZIDE ) 12.5 MG capsule Take 1 capsule (12.5 mg total) by mouth daily. 06/25/24   Majeed, Camie, DO  insulin  glargine (LANTUS  SOLOSTAR) 100 UNIT/ML Solostar Pen Inject 4 Units into the skin 2 (two) times daily. 03/11/24   McDiarmid, Krystal BIRCH, MD  insulin  lispro (HUMALOG ) 100 UNIT/ML injection INJECT 2-4 UNITS INTO THE SKIN 3 (THREE) TIMES DAILY WITH MEALS 04/07/24   Larraine Palma, MD  Insulin  Lispro w/ [place] (HUMALOG  TEMPO  PEN) 100 UNIT/ML SOPN INJECT 2-4 UNITS INTO THE SKIN 3 (THREE) TIMES DAILY WITH MEALS 05/20/24   Baloch, Mahnoor, MD  Insulin  Pen Needle 31G X 5 MM MISC 1 Container by Does not apply route once as needed for up to 1 dose. 10/27/20   Mullis, Kiersten P, DO  metFORMIN  (GLUCOPHAGE ) 1000 MG tablet Take 1 tablet (1,000 mg total) by mouth 2 (two) times daily. 02/19/24   Majeed, Camie, DO  Olopatadine  HCl 0.2 % SOLN Place 1 drop into both eyes daily.    [provider]  pantoprazole  (PROTONIX ) 40 MG tablet Take 1 tablet (40 mg total) by mouth daily. 04/10/24   Nemecek, Alan, MD   rosuvastatin  (CRESTOR ) 5 MG tablet TAKE 1 TABLET (5 MG TOTAL) BY MOUTH 2 TIMES A WEEK 07/09/24   Camnitz, Soyla Lunger, MD  Semaglutide , 2 MG/DOSE, (OZEMPIC , 2 MG/DOSE,) 8 MG/3ML SOPN Inject 2 mg into the skin once a week. 02/28/23   Jennelle Riis, MD  valsartan  (DIOVAN ) 160 MG tablet Take 1 tablet (160 mg total) by mouth daily. 06/02/24   Majeed, Camie, DO    Allergies: Sulfonamide derivatives, Lipitor [atorvastatin calcium ], Ramipril, and Trulicity  [dulaglutide ]    Review of Systems  Updated Vital Signs BP 124/67 (BP Location: Right Arm)   Pulse 80   Temp 98 F (36.7 C)   Resp 16   SpO2 99%   Physical Exam Vitals and nursing note reviewed.  Constitutional:      General: She is not in acute distress.    Appearance: She is well-developed.  HENT:     Head: Normocephalic and atraumatic.  Eyes:     Conjunctiva/sclera: Conjunctivae normal.  Cardiovascular:     Rate and Rhythm: Normal rate and regular rhythm.     Heart sounds: No murmur heard. Pulmonary:     Effort: Pulmonary effort is normal. No respiratory distress.     Breath sounds: Normal breath sounds.  Abdominal:     Palpations: Abdomen is soft.     Tenderness: There is abdominal tenderness.     Comments: Mild right upper quadrant tenderness  Musculoskeletal:        General: No swelling.     Cervical back: Neck supple.  Skin:    General: Skin is warm and dry.     Capillary Refill: Capillary refill takes less than 2 seconds.  Neurological:     Mental Status: She is alert.  Psychiatric:        Mood and Affect: Mood normal.     (all labs ordered are listed, but only abnormal results are displayed) Labs Reviewed  CBC - Abnormal; Notable for the following components:      Result Value   RBC 3.53 (*)    Hemoglobin 10.3 (*)    HCT 32.0 (*)    RDW 15.9 (*)    All other components within normal limits  COMPREHENSIVE METABOLIC PANEL WITH GFR - Abnormal; Notable for the following components:   Potassium 3.1 (*)     Chloride 97 (*)    Glucose, Bld 166 (*)    AST 370 (*)    ALT 658 (*)    Alkaline Phosphatase 265 (*)    Total Bilirubin 4.4 (*)    All other components within normal limits    EKG: None  Radiology: US  Abdomen Limited RUQ (LIVER/GB) Result Date: 07/25/2024 CLINICAL DATA:  positive murphy sign.  Abdominal pain EXAM: ULTRASOUND ABDOMEN LIMITED RIGHT UPPER QUADRANT COMPARISON:  May 08, 2022. FINDINGS: Gallbladder: Gallbladder  is distended. Positive sonographic Murphy sign noted by sonographer. No definitive cholelithiasis but likely layering sludge. Gallbladder wall thickness is at the upper limits of normal at 3 mm. Common bile duct: Diameter: Visualized portion measures 14 mm, dilated. There are low-level echoes within the common bile duct which could reflect biliary sludge. Liver: No focal lesion identified. Within normal limits in parenchymal echogenicity. Portal vein is patent on color Doppler imaging with normal direction of blood flow towards the liver. Other: None. IMPRESSION: 1. Gallbladder is distended with positive sonographic Murphy sign. No definitive cholelithiasis but likely layering sludge. Findings are concerning for acute cholecystitis. 2. Common bile duct is dilated measuring up to 14 mm. There are low-level echoes within the common bile duct which could reflect biliary sludge. Consider further evaluation with dedicated cross-sectional imaging such as MRI with MRCP These results will be called to the ordering clinician or representative by the Radiologist Assistant, and communication documented in the PACS or [employer]. Electronically Signed   By: Corean Salter M.D.   On: 07/25/2024 15:43    {Document cardiac monitor, telemetry assessment procedure when appropriate:32947} Procedures   Medications Ordered in the ED  morphine  (PF) 4 MG/ML injection 4 mg (has no administration in time range)      {Click here for ABCD2, HEART and other calculators REFRESH Note  before signing:1}                              Medical Decision Making Amount and/or Complexity of Data Reviewed Labs: ordered.  Risk Prescription drug management.   This patient presents to the ED for concern of abdominal pain, this involves an extensive number of treatment options, and is a complaint that carries with it a high risk of complications and morbidity.  The differential diagnosis includes cholecystitis, choledocholithiasis, pancreatitis, gastritis, GERD, others   Co morbidities / Chronic conditions that complicate the patient evaluation  As noted in HPI   Additional history obtained:  Additional history obtained from EMR External records from outside source obtained and reviewed including family medicine notes   Lab Tests:  I Ordered, and personally interpreted labs.  The pertinent results include: Worsening LFTs including total bilirubin of 4.4, AST 370, ALT 658, alkaline phosphatase 265   Imaging Studies ordered:  I reviewed outside imaging including right upper quadrant ultrasound.  Imaging was performed yesterday showing: 1. Gallbladder is distended with positive sonographic Murphy sign. No definitive cholelithiasis but likely layering sludge. Findings are concerning for acute cholecystitis. 2. Common bile duct is dilated measuring up to 14 mm. There are low-level echoes within the common bile duct which could reflect biliary sludge. Consider further evaluation with dedicated cross-sectional imaging such as MRI with MRCP   Problem List / ED Course / Critical interventions / Medication management   I ordered medication including morphine  Reevaluation of the patient after these medicines showed that the patient *** I have reviewed the patients home medicines and have made adjustments as needed   Consultations Obtained:  I requested consultation with the general surgeon, Dr. Paola,  and discussed lab and imaging findings as well as pertinent plan -  they recommend: ***   Test / Admission - Considered:  ***   {Document critical care time when appropriate  Document review of labs and clinical decision tools ie [id number], etc  Document your independent review of radiology images and any outside records  Document your discussion with family members, caretakers and  with consultants  Document social determinants of health affecting pt's care  Document your decision making why or why not admission, treatments were needed:32947:::1}   Final diagnoses:  None    ED Discharge Orders     None        "

## 2024-07-25 NOTE — Telephone Encounter (Signed)
 Contacted by Dr. Delores for positive RUQ US  for acute cholecystitis with possible stone in bile duct.  I called the patient and was able to relay the results to her through an ASL interpreter service and emphasized that she needs to be seen in the [facility] ED tonight.  The patient asked me to call her daughter and inform her so she can help her pack and take her to the hospital.  I then called her daughter [name] and informed her of her results and the importance of coming to the ED for surgical follow-up.  Both patient and daughter verbalized understanding.

## 2024-07-25 NOTE — Telephone Encounter (Signed)
 Received call from [facility] radiology regarding results of RUQ ultrasound. See impression below.   IMPRESSION: 1. Gallbladder is distended with positive sonographic Murphy sign. No definitive cholelithiasis but likely layering sludge. Findings are concerning for acute cholecystitis. 2. Common bile duct is dilated measuring up to 14 mm. There are low-level echoes within the common bile duct which could reflect biliary sludge. Consider further evaluation with dedicated cross-sectional imaging such as MRI with MRCP.   Spoke with Dr. Delores regarding results. Forwarding to her and Dr. Lupie.   Chiquita JAYSON English, RN

## 2024-07-25 NOTE — ED Provider Notes (Signed)
 " [place] [facility] AT [facility] Provider Note   CSN: [id number] Arrival date & time: 07/25/24  8158     Patient presents with: No chief complaint on file.   Alicia [name] is a 69 y.o. female.  Patient with past medical history significant for type II DM, hearing loss, IBS, GERD, gastroparesis presents to the emergency department with concerns over intermittent right upper quadrant pain with nausea and vomiting.  Pain has been present for approximately 3 weeks.  She was evaluated as an outpatient and imaging and labs were concerning for possible gallbladder etiology.  She states that her nausea have subsided and she has intermittent right upper quadrant abdominal pain.  She denies diarrhea, fever, chest pain, shortness of breath Bedside visitor used as ASL interpreter    HPI     Prior to Admission medications  Medication Sig Start Date End Date Taking? Authorizing Provider  ACCU-CHEK SOFTCLIX LANCETS lancets TEST 4 TIMES A DAY 07/11/17   Lanny [name], MD  acetaminophen  (TYLENOL ) 650 MG CR tablet Take 2 tablets (1,300 mg total) by mouth every 8 (eight) hours as needed for pain. 11/26/20   Scarlet Elsie LABOR, MD  aspirin  81 MG chewable tablet Chew 81 mg by mouth daily.    [provider]  carvedilol  (COREG ) 25 MG tablet TAKE 1 TABLET BY MOUTH TWICE A DAY. 04/03/24   Parthenia Olivia HERO, PA-C  cholecalciferol  (VITAMIN D ) 1000 UNITS tablet Take 1,000 Units by mouth daily.    [provider]  Continuous Glucose Sensor (FREESTYLE LIBRE 3 PLUS SENSOR) MISC Change sensor every 15 days. 03/11/24   McDiarmid, Krystal BIRCH, MD  Cyanocobalamin  (VITAMIN B 12) 500 MCG TABS Take 500 mcg by mouth daily at 6 (six) AM.    [provider]  diclofenac  Sodium (VOLTAREN ) 1 % GEL Apply 2 g topically 4 (four) times daily. Rub into affected area of foot 2 to 4 times daily 09/18/19   Gershon Donnice JONELLE, DPM  diltiazem  (CARDIZEM  CD) 240 MG 24 hr capsule Take 1 capsule (240 mg  total) by mouth daily. 03/05/24   Majeed, Camie, DO  diltiazem  (CARDIZEM ) 30 MG tablet TAKE 1 TABLET BY MOUTH AS NEEDED (HEART RACING OR PALPITATIONS THAT LAST LONGER THAN 15 MINS). 02/19/24   Majeed, Camie, DO  DULoxetine  (CYMBALTA ) 60 MG capsule Take 1 capsule (60 mg total) by mouth daily. 05/20/24   Baloch, Mahnoor, MD  famotidine  (PEPCID ) 20 MG tablet Take 1 tablet (20 mg total) by mouth 2 (two) times daily. 03/04/24   Majeed, Camie, DO  fluticasone  (FLONASE ) 50 MCG/ACT nasal spray 2 puffs each nostril daily 03/11/24   McDiarmid, Krystal BIRCH, MD  gabapentin  (NEURONTIN ) 800 MG tablet TAKE 1 TABLET BY MOUTH TWICE A DAY 04/07/24   Gershon Donnice JONELLE, DPM  glucose blood (ONETOUCH ULTRA) test strip Use as instructed 03/11/24   McDiarmid, Krystal BIRCH, MD  glucose blood test strip Use as instructed 08/09/22   Jennelle Riis, MD  hydrochlorothiazide  (MICROZIDE ) 12.5 MG capsule Take 1 capsule (12.5 mg total) by mouth daily. 06/25/24   Majeed, Camie, DO  insulin  glargine (LANTUS  SOLOSTAR) 100 UNIT/ML Solostar Pen Inject 4 Units into the skin 2 (two) times daily. 03/11/24   McDiarmid, Krystal BIRCH, MD  insulin  lispro (HUMALOG ) 100 UNIT/ML injection INJECT 2-4 UNITS INTO THE SKIN 3 (THREE) TIMES DAILY WITH MEALS 04/07/24   Larraine Palma, MD  Insulin  Lispro w/ [place] (HUMALOG  TEMPO PEN) 100 UNIT/ML SOPN INJECT 2-4 UNITS INTO THE  SKIN 3 (THREE) TIMES DAILY WITH MEALS 05/20/24   Baloch, Mahnoor, MD  Insulin  Pen Needle 31G X 5 MM MISC 1 Container by Does not apply route once as needed for up to 1 dose. 10/27/20   Mullis, Kiersten P, DO  metFORMIN  (GLUCOPHAGE ) 1000 MG tablet Take 1 tablet (1,000 mg total) by mouth 2 (two) times daily. 02/19/24   Majeed, Camie, DO  Olopatadine  HCl 0.2 % SOLN Place 1 drop into both eyes daily.    [provider]  pantoprazole  (PROTONIX ) 40 MG tablet Take 1 tablet (40 mg total) by mouth daily. 04/10/24   Larraine Palma, MD  rosuvastatin  (CRESTOR ) 5 MG tablet TAKE 1 TABLET (5 MG TOTAL) BY MOUTH 2 TIMES A  WEEK 07/09/24   Camnitz, Soyla Lunger, MD  Semaglutide , 2 MG/DOSE, (OZEMPIC , 2 MG/DOSE,) 8 MG/3ML SOPN Inject 2 mg into the skin once a week. 02/28/23   Jennelle Riis, MD  valsartan  (DIOVAN ) 160 MG tablet Take 1 tablet (160 mg total) by mouth daily. 06/02/24   Majeed, Camie, DO    Allergies: Sulfonamide derivatives, Lipitor [atorvastatin calcium ], Ramipril, and Trulicity  [dulaglutide ]    Review of Systems  Updated Vital Signs BP 124/67 (BP Location: Right Arm)   Pulse 80   Temp 98 F (36.7 C)   Resp 16   SpO2 99%   Physical Exam Vitals and nursing note reviewed.  Constitutional:      General: She is not in acute distress.    Appearance: She is well-developed.  HENT:     Head: Normocephalic and atraumatic.  Eyes:     Conjunctiva/sclera: Conjunctivae normal.  Cardiovascular:     Rate and Rhythm: Normal rate and regular rhythm.     Heart sounds: No murmur heard. Pulmonary:     Effort: Pulmonary effort is normal. No respiratory distress.     Breath sounds: Normal breath sounds.  Abdominal:     Palpations: Abdomen is soft.     Tenderness: There is abdominal tenderness.     Comments: Mild right upper quadrant tenderness  Musculoskeletal:        General: No swelling.     Cervical back: Neck supple.  Skin:    General: Skin is warm and dry.     Capillary Refill: Capillary refill takes less than 2 seconds.  Neurological:     Mental Status: She is alert.  Psychiatric:        Mood and Affect: Mood normal.     (all labs ordered are listed, but only abnormal results are displayed) Labs Reviewed  CBC - Abnormal; Notable for the following components:      Result Value   RBC 3.53 (*)    Hemoglobin 10.3 (*)    HCT 32.0 (*)    RDW 15.9 (*)    All other components within normal limits  COMPREHENSIVE METABOLIC PANEL WITH GFR - Abnormal; Notable for the following components:   Potassium 3.1 (*)    Chloride 97 (*)    Glucose, Bld 166 (*)    AST 370 (*)    ALT 658 (*)    Alkaline  Phosphatase 265 (*)    Total Bilirubin 4.4 (*)    All other components within normal limits    EKG: None  Radiology: US  Abdomen Limited RUQ (LIVER/GB) Result Date: 07/25/2024 CLINICAL DATA:  positive murphy sign.  Abdominal pain EXAM: ULTRASOUND ABDOMEN LIMITED RIGHT UPPER QUADRANT COMPARISON:  May 08, 2022. FINDINGS: Gallbladder: Gallbladder is distended. Positive sonographic Murphy sign noted by sonographer.  No definitive cholelithiasis but likely layering sludge. Gallbladder wall thickness is at the upper limits of normal at 3 mm. Common bile duct: Diameter: Visualized portion measures 14 mm, dilated. There are low-level echoes within the common bile duct which could reflect biliary sludge. Liver: No focal lesion identified. Within normal limits in parenchymal echogenicity. Portal vein is patent on color Doppler imaging with normal direction of blood flow towards the liver. Other: None. IMPRESSION: 1. Gallbladder is distended with positive sonographic Murphy sign. No definitive cholelithiasis but likely layering sludge. Findings are concerning for acute cholecystitis. 2. Common bile duct is dilated measuring up to 14 mm. There are low-level echoes within the common bile duct which could reflect biliary sludge. Consider further evaluation with dedicated cross-sectional imaging such as MRI with MRCP These results will be called to the ordering clinician or representative by the Radiologist Assistant, and communication documented in the PACS or [employer]. Electronically Signed   By: Corean Salter M.D.   On: 07/25/2024 15:43     Procedures   Medications Ordered in the ED  morphine  (PF) 4 MG/ML injection 4 mg (4 mg Intravenous Given 07/25/24 2340)                                    Medical Decision Making Amount and/or Complexity of Data Reviewed Labs: ordered.  Risk Prescription drug management.   This patient presents to the ED for concern of abdominal pain, this  involves an extensive number of treatment options, and is a complaint that carries with it a high risk of complications and morbidity.  The differential diagnosis includes cholecystitis, choledocholithiasis, pancreatitis, gastritis, GERD, others   Co morbidities / Chronic conditions that complicate the patient evaluation  As noted in HPI   Additional history obtained:  Additional history obtained from EMR External records from outside source obtained and reviewed including family medicine notes   Lab Tests:  I Ordered, and personally interpreted labs.  The pertinent results include: Worsening LFTs including total bilirubin of 4.4, AST 370, ALT 658, alkaline phosphatase 265   Imaging Studies ordered:  I reviewed outside imaging including right upper quadrant ultrasound.  Imaging was performed yesterday showing: 1. Gallbladder is distended with positive sonographic Murphy sign. No definitive cholelithiasis but likely layering sludge. Findings are concerning for acute cholecystitis. 2. Common bile duct is dilated measuring up to 14 mm. There are low-level echoes within the common bile duct which could reflect biliary sludge. Consider further evaluation with dedicated cross-sectional imaging such as MRI with MRCP   Problem List / ED Course / Critical interventions / Medication management   I ordered medication including morphine  Reevaluation of the patient after these medicines showed that the patient improved I have reviewed the patients home medicines and have made adjustments as needed   Consultations Obtained:  I requested consultation with the general surgeon, Dr. Paola,  and discussed lab and imaging findings as well as pertinent plan - they recommend: admission to medicine for MRCP, will follow  I requested consultation with the family practice team.  They agreed to admit the patient   Test / Admission - Considered:  Patient with elevated LFTs and right upper  quadrant pain.  Patient will need admission for further evaluation.      Final diagnoses:  Elevated LFTs  Common bile duct dilatation    ED Discharge Orders     None  Logan Ubaldo NOVAK, PA-C 07/26/24 0036  "

## 2024-07-25 NOTE — ED Triage Notes (Signed)
 PT sent by PCP for gall bladder concerns.  Based on blood work and imaging they think she may need it out and wanted her here for admission for possible surgery on Monday.

## 2024-07-26 ENCOUNTER — Inpatient Hospital Stay (HOSPITAL_COMMUNITY): Payer: Medicare (Managed Care)

## 2024-07-26 DIAGNOSIS — R1011 Right upper quadrant pain: Secondary | ICD-10-CM

## 2024-07-26 DIAGNOSIS — R109 Unspecified abdominal pain: Secondary | ICD-10-CM

## 2024-07-26 DIAGNOSIS — K838 Other specified diseases of biliary tract: Secondary | ICD-10-CM

## 2024-07-26 DIAGNOSIS — E119 Type 2 diabetes mellitus without complications: Secondary | ICD-10-CM

## 2024-07-26 DIAGNOSIS — E1142 Type 2 diabetes mellitus with diabetic polyneuropathy: Secondary | ICD-10-CM | POA: Diagnosis not present

## 2024-07-26 DIAGNOSIS — R7989 Other specified abnormal findings of blood chemistry: Secondary | ICD-10-CM | POA: Diagnosis not present

## 2024-07-26 DIAGNOSIS — K819 Cholecystitis, unspecified: Secondary | ICD-10-CM | POA: Diagnosis not present

## 2024-07-26 DIAGNOSIS — K8020 Calculus of gallbladder without cholecystitis without obstruction: Secondary | ICD-10-CM | POA: Diagnosis present

## 2024-07-26 LAB — COMPREHENSIVE METABOLIC PANEL WITH GFR
ALT: 634 U/L — ABNORMAL HIGH (ref 0–44)
AST: 334 U/L — ABNORMAL HIGH (ref 15–41)
Albumin: 4 g/dL (ref 3.5–5.0)
Alkaline Phosphatase: 260 U/L — ABNORMAL HIGH (ref 38–126)
Anion gap: 11 (ref 5–15)
BUN: 13 mg/dL (ref 8–23)
CO2: 28 mmol/L (ref 22–32)
Calcium: 9.9 mg/dL (ref 8.9–10.3)
Chloride: 97 mmol/L — ABNORMAL LOW (ref 98–111)
Creatinine, Ser: 0.78 mg/dL (ref 0.44–1.00)
GFR, Estimated: >60 mL/min
Glucose, Bld: 309 mg/dL — ABNORMAL HIGH (ref 70–99)
Potassium: 3.3 mmol/L — ABNORMAL LOW (ref 3.5–5.1)
Sodium: 136 mmol/L (ref 135–145)
Total Bilirubin: 4.1 mg/dL — ABNORMAL HIGH (ref 0.0–1.2)
Total Protein: 6.7 g/dL (ref 6.5–8.1)

## 2024-07-26 LAB — CBC
HCT: 31.4 % — ABNORMAL LOW (ref 36.0–46.0)
Hemoglobin: 10.3 g/dL — ABNORMAL LOW (ref 12.0–15.0)
MCH: 29.1 pg (ref 26.0–34.0)
MCHC: 32.8 g/dL (ref 30.0–36.0)
MCV: 88.7 fL (ref 80.0–100.0)
Platelets: 324 10*3/uL (ref 150–400)
RBC: 3.54 MIL/uL — ABNORMAL LOW (ref 3.87–5.11)
RDW: 15.8 % — ABNORMAL HIGH (ref 11.5–15.5)
WBC: 5.6 10*3/uL (ref 4.0–10.5)
nRBC: 0 % (ref 0.0–0.2)

## 2024-07-26 LAB — GLUCOSE, CAPILLARY
Glucose-Capillary: 234 mg/dL — ABNORMAL HIGH (ref 70–99)
Glucose-Capillary: 233 mg/dL — ABNORMAL HIGH (ref 70–99)
Glucose-Capillary: 377 mg/dL — ABNORMAL HIGH (ref 70–99)
Glucose-Capillary: 292 mg/dL — ABNORMAL HIGH (ref 70–99)
Glucose-Capillary: 293 mg/dL — ABNORMAL HIGH (ref 70–99)

## 2024-07-26 LAB — HIV ANTIBODY (ROUTINE TESTING W REFLEX): HIV Screen 4th Generation wRfx: NONREACTIVE

## 2024-07-26 LAB — LIPASE, BLOOD: Lipase: 13 U/L (ref 11–51)

## 2024-07-26 MED ORDER — ACETAMINOPHEN 650 MG RE SUPP: 650.0000 mg | Freq: Four times a day (QID) | RECTAL | Status: DC | PRN

## 2024-07-26 MED ORDER — DULOXETINE HCL 60 MG PO CPEP
60.0000 mg | ORAL_CAPSULE | Freq: Every day | ORAL | Status: DC
  Administered 2024-07-26 – 2024-07-30 (×5): 60 mg via ORAL
  Filled 2024-07-26 (×5): qty 1

## 2024-07-26 MED ORDER — OXYCODONE HCL 5 MG PO TABS: 5.0000 mg | ORAL_TABLET | Freq: Four times a day (QID) | ORAL | Status: DC | PRN

## 2024-07-26 MED ORDER — DILTIAZEM HCL ER COATED BEADS 120 MG PO CP24
240.0000 mg | ORAL_CAPSULE | Freq: Every day | ORAL | Status: DC
  Administered 2024-07-26 – 2024-07-30 (×4): 240 mg via ORAL
  Filled 2024-07-26 (×4): qty 2

## 2024-07-26 MED ORDER — POTASSIUM CHLORIDE CRYS ER 20 MEQ PO TBCR
40.0000 meq | EXTENDED_RELEASE_TABLET | ORAL | Status: AC
  Administered 2024-07-26 (×2): 40 meq via ORAL
  Filled 2024-07-26 (×2): qty 2

## 2024-07-26 MED ORDER — IRBESARTAN 150 MG PO TABS
150.0000 mg | ORAL_TABLET | Freq: Every day | ORAL | Status: DC
  Administered 2024-07-26 – 2024-07-30 (×4): 150 mg via ORAL
  Filled 2024-07-26 (×4): qty 1

## 2024-07-26 MED ORDER — HYDROCHLOROTHIAZIDE 12.5 MG PO TABS
12.5000 mg | ORAL_TABLET | Freq: Every day | ORAL | Status: DC
  Administered 2024-07-26 – 2024-07-30 (×4): 12.5 mg via ORAL
  Filled 2024-07-26 (×4): qty 1

## 2024-07-26 MED ORDER — PANTOPRAZOLE SODIUM 40 MG PO TBEC
40.0000 mg | DELAYED_RELEASE_TABLET | Freq: Every day | ORAL | Status: DC
  Administered 2024-07-26 – 2024-07-30 (×5): 40 mg via ORAL
  Filled 2024-07-26 (×5): qty 1

## 2024-07-26 MED ORDER — GADOBUTROL 1 MMOL/ML IV SOLN
6.0000 mL | Freq: Once | INTRAVENOUS | Status: AC | PRN
  Administered 2024-07-26: 6 mL via INTRAVENOUS

## 2024-07-26 MED ORDER — HEPARIN SODIUM (PORCINE) 5000 UNIT/ML IJ SOLN
5000.0000 [IU] | Freq: Three times a day (TID) | INTRAMUSCULAR | Status: DC
  Administered 2024-07-26 – 2024-07-30 (×9): 5000 [IU] via SUBCUTANEOUS
  Filled 2024-07-26 (×10): qty 1

## 2024-07-26 MED ORDER — GABAPENTIN 400 MG PO CAPS
800.0000 mg | ORAL_CAPSULE | Freq: Two times a day (BID) | ORAL | Status: DC
  Administered 2024-07-26 – 2024-07-30 (×10): 800 mg via ORAL
  Filled 2024-07-26 (×10): qty 2

## 2024-07-26 MED ORDER — CARVEDILOL 25 MG PO TABS
25.0000 mg | ORAL_TABLET | Freq: Two times a day (BID) | ORAL | Status: DC
  Administered 2024-07-26 – 2024-07-30 (×7): 25 mg via ORAL
  Filled 2024-07-26 (×8): qty 1

## 2024-07-26 MED ORDER — LORAZEPAM 2 MG/ML IJ SOLN
1.0000 mg | Freq: Once | INTRAMUSCULAR | Status: AC | PRN
  Administered 2024-07-26: 1 mg via INTRAVENOUS
  Filled 2024-07-26: qty 1

## 2024-07-26 MED ORDER — ACETAMINOPHEN 500 MG PO TABS
1000.0000 mg | ORAL_TABLET | Freq: Four times a day (QID) | ORAL | Status: DC
  Administered 2024-07-26 (×4): 1000 mg via ORAL
  Filled 2024-07-26 (×4): qty 2

## 2024-07-26 MED ORDER — ACETAMINOPHEN 325 MG PO TABS: 650.0000 mg | ORAL_TABLET | Freq: Four times a day (QID) | ORAL | Status: DC | PRN

## 2024-07-26 MED ORDER — ASPIRIN 81 MG PO CHEW
81.0000 mg | CHEWABLE_TABLET | Freq: Every day | ORAL | Status: DC
  Administered 2024-07-26 – 2024-07-30 (×5): 81 mg via ORAL
  Filled 2024-07-26 (×5): qty 1

## 2024-07-26 MED ORDER — INSULIN ASPART 100 UNIT/ML IJ SOLN
0.0000 [IU] | Freq: Three times a day (TID) | INTRAMUSCULAR | Status: DC
  Administered 2024-07-26: 3 [IU] via SUBCUTANEOUS
  Administered 2024-07-26: 2 [IU] via SUBCUTANEOUS
  Administered 2024-07-26: 5 [IU] via SUBCUTANEOUS
  Administered 2024-07-27 – 2024-07-28 (×2): 4 [IU] via SUBCUTANEOUS
  Administered 2024-07-28: 6 [IU] via SUBCUTANEOUS
  Administered 2024-07-28 – 2024-07-29 (×2): 5 [IU] via SUBCUTANEOUS
  Administered 2024-07-29: 4 [IU] via SUBCUTANEOUS
  Filled 2024-07-26: qty 4
  Filled 2024-07-26: qty 5
  Filled 2024-07-26: qty 1
  Filled 2024-07-26 (×2): qty 3
  Filled 2024-07-26 (×2): qty 4
  Filled 2024-07-26: qty 3
  Filled 2024-07-26 (×2): qty 6

## 2024-07-26 MED ORDER — FAMOTIDINE 20 MG PO TABS
20.0000 mg | ORAL_TABLET | Freq: Two times a day (BID) | ORAL | Status: DC
  Administered 2024-07-26 – 2024-07-30 (×9): 20 mg via ORAL
  Filled 2024-07-26 (×9): qty 1

## 2024-07-26 MED ORDER — LORAZEPAM 1 MG PO TABS: 1.0000 mg | ORAL_TABLET | Freq: Once | ORAL | Status: DC | PRN

## 2024-07-26 NOTE — Consult Note (Signed)
 Referring Provider: Dr. Madelon Primary Care Physician:  Lupie Credit, DO Primary Gastroenterologist:  Dr. Dianna  Reason for Consultation:  Elevated LFTs; Abdominal pain  HPI: Alicia [name] is a 68 y.o. female who is deaf and history obtained through sign language with her daughter (Patient did not want to use Stratus machine) and seen due to the onset of RUQ pain 3 weeks ago with nausea and vomiting. Abdominal pain is sharp and stabbing at times. Also having bilateral lower abdominal pain. Denies fevers, chills, diarrhea, melena, or rectal bleeding. History of diabetic gastroparesis and GERD. U/S without definite gallstones and concerning for acute cholecystitis. Dilated CBD of 14 mm with possible sludge. MRCP concerning for distal biliary stricture and pancreatic duct stricture. MRCP also shows findings of chronic pancreatitis. LFTs on 1/23: TB 4.4, ALP 265, AST 370, ALT 658.  Past Medical History:  Diagnosis Date   Anemia    Arthritis    back and knees   ARTHRITIS, KNEE 09/17/2007   Asthma 04/04/2010   pt states she does not have asthma   Cataract 01/07/2019   Closed fracture of distal end of left radius 11/01/2015   Complete deafness    Meningitis at age 2   Complication of anesthesia    Deaf    Diabetes mellitus    Diabetic neuropathy (HCC)    Dysrhythmia    Ganglion cyst 06/22/2011   Gastroparesis    GERD (gastroesophageal reflux disease)    H/O: C-section    H/O: C-section 03/20/2022   Hyperlipidemia    Hypertension    PONV (postoperative nausea and vomiting)    PSVT (paroxysmal supraventricular tachycardia) 11/09/2017   Event monitor 09/14/2017 - Predominantly sinus rhythm with episodes of narrow-complex tachycardia suggestive of paroxysmal supraventricular tachycardia.   S/P appy     Past Surgical History:  Procedure Laterality Date   APPENDECTOMY     cardiolyte EF 77%, no ischemia in 2006  07/03/2004   CARPAL TUNNEL RELEASE Right 04/20/2015   Procedure: RIGHT  CARPAL TUNNEL RELEASE;  Surgeon: Arley Curia, MD;  Location: [facility];  Service: Orthopedics;  Laterality: Right;   CARPAL TUNNEL RELEASE Left 11/04/2015   Procedure: CARPAL TUNNEL RELEASE;  Surgeon: Arley Curia, MD;  Location: [facility];  Service: Orthopedics;  Laterality: Left;   CERVICAL FUSION     CESAREAN SECTION     OPEN REDUCTION INTERNAL FIXATION (ORIF) DISTAL RADIAL FRACTURE Left 11/04/2015   Procedure: OPEN REDUCTION INTERNAL FIXATION (ORIF) LEFT DISTAL RADIAL FRACTURE POSSIBLE BONE GRAFT;  Surgeon: Arley Curia, MD;  Location: [facility];  Service: Orthopedics;  Laterality: Left;   POSTERIOR CERVICAL FUSION/FORAMINOTOMY N/A 12/26/2021   Procedure: Posterior cervical fusion with lateral mass fixation - Cervical Three-Cervical Six, Cervical Laminectomy Cervical Three-Cervica; Five;  Surgeon: Joshua Alm RAMAN, MD;  Location: [facility];  Service: Neurosurgery;  Laterality: N/A;   TRIGGER FINGER RELEASE Right 04/20/2015   Procedure: RELEASE TRIGGER FINGER/A-1 PULLEY RIGHT MIDDLE FINGER,RIGHT RING FINGER;  Surgeon: Arley Curia, MD;  Location: [facility];  Service: Orthopedics;  Laterality: Right;   TUBAL LIGATION     ULNAR NERVE TRANSPOSITION Right 04/20/2015   Procedure: RIGHT ULNAR NERVE DECOMPRESSION;  Surgeon: Arley Curia, MD;  Location:  SUR[facility]ERY [facility];  Service: Orthopedics;  Laterality: Right;   UTERINE FIBROID EMBOLIZATION  07/03/2005   WRIST SURGERY     Cyst removed on left   WRIST SURGERY Right 07/04/1983   tendon repair R wrist    Prior  to Admission medications  Medication Sig Start Date End Date Taking? Authorizing Provider  acetaminophen  (TYLENOL ) 500 MG tablet Take 1,000 mg by mouth every 6 (six) hours as needed for mild pain (pain score 1-3).   Yes [provider]  aspirin  81 MG chewable tablet Chew 81 mg by mouth daily.   Yes [provider]  carvedilol  (COREG ) 25 MG tablet TAKE 1 TABLET BY  MOUTH TWICE A DAY. 04/03/24  Yes Parthenia Olivia HERO, PA-C  cholecalciferol  (VITAMIN D ) 1000 UNITS tablet Take 1,000 Units by mouth daily.   Yes [provider]  Cyanocobalamin  (VITAMIN B 12) 500 MCG TABS Take 500 mcg by mouth daily at 6 (six) AM.   Yes [provider]  diltiazem  (CARDIZEM  CD) 240 MG 24 hr capsule Take 1 capsule (240 mg total) by mouth daily. 03/05/24  Yes Majeed, Camie, DO  diltiazem  (CARDIZEM ) 30 MG tablet Take 30 mg by mouth as needed (Palpitations). TAKE 1 TABLET BY MOUTH AS NEEDED (HEART RACING OR PALPITATIONS THAT LAST LONGER THAN 15 MINS   Yes [provider]  DULoxetine  (CYMBALTA ) 60 MG capsule Take 1 capsule (60 mg total) by mouth daily. 05/20/24  Yes Baloch, Mahnoor, MD  famotidine  (PEPCID ) 20 MG tablet Take 1 tablet (20 mg total) by mouth 2 (two) times daily. 03/04/24  Yes Majeed, Camie, DO  fluticasone  (FLONASE ) 50 MCG/ACT nasal spray 2 puffs each nostril daily Patient taking differently: Place 2 sprays into both nostrils daily as needed for allergies or rhinitis. 2 puffs each nostril daily 03/11/24  Yes McDiarmid, Krystal BIRCH, MD  gabapentin  (NEURONTIN ) 800 MG tablet TAKE 1 TABLET BY MOUTH TWICE A DAY 04/07/24  Yes Gershon Donnice SAUNDERS, DPM  hydrochlorothiazide  (MICROZIDE ) 12.5 MG capsule Take 1 capsule (12.5 mg total) by mouth daily. 06/25/24  Yes Majeed, Camie, DO  insulin  glargine (LANTUS  SOLOSTAR) 100 UNIT/ML Solostar Pen Inject 4 Units into the skin 2 (two) times daily. Patient taking differently: Inject 5 Units into the skin 2 (two) times daily. 03/11/24  Yes McDiarmid, Krystal BIRCH, MD  insulin  lispro (HUMALOG ) 100 UNIT/ML injection INJECT 2-4 UNITS INTO THE SKIN 3 (THREE) TIMES DAILY WITH MEALS Patient taking differently: Inject 6-8 Units into the skin 3 (three) times daily with meals. INJECT 6-8 UNITS INTO THE SKIN 3 (THREE) TIMES DAILY WITH MEALS 04/07/24  Yes Nemecek, Alan, MD  Lidocaine -Menthol  (NERVIVE ROLL-ON) 4-1 % LIQD Apply 1 Application topically as  needed (foot (nerve) pain).   Yes [provider]  metFORMIN  (GLUCOPHAGE ) 1000 MG tablet Take 1 tablet (1,000 mg total) by mouth 2 (two) times daily. 02/19/24  Yes Majeed, Camie, DO  Multiple Vitamins-Minerals (MULTIVITAMIN GUMMIES WOMENS) CHEW Chew 2 each by mouth daily.   Yes [provider]  olopatadine  (PATADAY ) 0.1 % ophthalmic solution Place 2 drops into both eyes every morning.   Yes [provider]  pantoprazole  (PROTONIX ) 40 MG tablet Take 1 tablet (40 mg total) by mouth daily. 04/10/24  Yes Nemecek, Alan, MD  Pre-Moistened Witch Hazel 50 % PADS Apply 1 each topically as needed (hemorrhoids).   Yes [provider]  Semaglutide , 2 MG/DOSE, (OZEMPIC , 2 MG/DOSE,) 8 MG/3ML SOPN Inject 2 mg into the skin once a week. Patient taking differently: Inject 2 mg into the skin every Saturday. 02/28/23  Yes Sowell, Penne, MD  valsartan  (DIOVAN ) 160 MG tablet Take 1 tablet (160 mg total) by mouth daily. Patient taking differently: Take 160 mg by mouth at bedtime. 06/02/24  Yes Lupie Camie, DO  rosuvastatin  (CRESTOR ) 5 MG tablet TAKE 1 TABLET (5 MG TOTAL) BY MOUTH 2 TIMES A WEEK Patient not taking: Reported on 07/26/2024 07/09/24   Inocencio Soyla Lunger, MD    Scheduled Meds:  acetaminophen   1,000 mg Oral Q6H WA   aspirin   81 mg Oral Daily   carvedilol   25 mg Oral BID WC   diltiazem   240 mg Oral Daily   DULoxetine   60 mg Oral Daily   famotidine   20 mg Oral BID   gabapentin   800 mg Oral BID   heparin   5,000 Units Subcutaneous Q8H   hydrochlorothiazide   12.5 mg Oral Daily   insulin  aspart  0-6 Units Subcutaneous TID WC   irbesartan   150 mg Oral Daily   pantoprazole   40 mg Oral Daily   Continuous Infusions: PRN Meds:.oxyCODONE   Allergies as of 07/25/2024 - Review Complete 07/25/2024  Allergen Reaction Noted   Sulfonamide derivatives Swelling and Rash 12/07/2006   Lipitor [atorvastatin calcium ] Other (See Comments) 09/13/2010   Ramipril Cough 12/07/2006    Trulicity  [dulaglutide ] Nausea And Vomiting 09/08/2016    Family History  Problem Relation Age of Onset   Hypertension Mother    Heart attack Mother 86   Diabetes Father    Cancer Maternal Aunt    Cancer Maternal Grandmother    Neuropathy Neg Hx    Breast cancer Neg Hx    BRCA 1/2 Neg Hx     Social History   Socioeconomic History   Marital status: Divorced    Spouse name: Not on file   Number of children: 3   Years of education: 12   Highest education level: Not on file  Occupational History   Occupation: daycare    Employer: [employer]  Tobacco Use   Smoking status: Never    Passive exposure: Never   Smokeless tobacco: Never  Vaping Use   Vaping status: Never Used  Substance and Sexual Activity   Alcohol use: Yes    Alcohol/week: 3.0 standard drinks of alcohol    Types: 3 Glasses of wine per week    Comment: 1-2 wine occasional, not every week   Drug use: Never   Sexual activity: Yes    Birth control/protection: Surgical  Other Topics Concern   Not on file  Social History Narrative   Lives at home with daughter   Right handed   Caffeine: often   Social Drivers of Health   Tobacco Use: Low Risk (07/25/2024)   Patient History    Smoking Tobacco Use: Never    Smokeless Tobacco Use: Never    Passive Exposure: Never  Financial Resource Strain: Not on file  Food Insecurity: No Food Insecurity (07/26/2024)   Epic    Worried About Programme Researcher, Broadcasting/film/video in the Last Year: Never true    Ran Out of Food in the Last Year: Never true  Transportation Needs: No Transportation Needs (07/26/2024)   Epic    Lack of Transportation (Medical): No    Lack of Transportation (Non-Medical): No  Physical Activity: Not on file  Stress: Not on file  Social Connections: Socially Isolated (07/26/2024)   Social Connection and Isolation Panel    Frequency of Communication with Friends and Family: Once a week    Frequency of Social Gatherings with Friends and Family: Once a  week    Attends Religious Services: Never    Database Administrator or Organizations: No    Attends Banker Meetings: Never  Marital Status: Divorced  Catering Manager Violence: Not At Risk (07/26/2024)   Epic    Fear of Current or Ex-Partner: No    Emotionally Abused: No    Physically Abused: No    Sexually Abused: No  Depression (PHQ2-9): Low Risk (07/11/2024)   Depression (PHQ2-9)    PHQ-2 Score: 2  Alcohol Screen: Not on file  Housing: Unknown (07/26/2024)   Epic    Unable to Pay for Housing in the Last Year: No    Number of Times Moved in the Last Year: Not on file    Homeless in the Last Year: No  Utilities: Not At Risk (07/26/2024)   Epic    Threatened with loss of utilities: No  Health Literacy: Not on file    Review of Systems: All negative except as stated above in HPI.  Physical Exam: Vital signs: Vitals:   07/26/24 1405 07/26/24 1830  BP: 131/69 113/69  Pulse: 79 75  Resp: 15 16  Temp:  97.9 F (36.6 C)  SpO2: 99% 97%   Last BM Date : 07/25/24 General:  Lethargic, elderly, thin, pleasant, no acute distress   Head: normocephalic, atraumatic Eyes: +icteric sclera ENT: oropharynx clear Neck: supple, nontender Lungs:  Clear throughout to auscultation.   No wheezes, crackles, or rhonchi. No acute distress. Heart:  Regular rate and rhythm; no murmurs, clicks, rubs,  or gallops. Abdomen: soft, nontender, nondistended, +BS  Rectal:  Deferred Ext: no edema  GI:  Lab Results: Recent Labs    07/25/24 1918 07/26/24 1122  WBC 7.0 5.6  HGB 10.3* 10.3*  HCT 32.0* 31.4*  PLT 333 324   BMET Recent Labs    07/25/24 1918 07/26/24 1122  NA 138 136  K 3.1* 3.3*  CL 97* 97*  CO2 27 28  GLUCOSE 166* 309*  BUN 18 13  CREATININE 0.98 0.78  CALCIUM  9.9 9.9   LFT Recent Labs    07/26/24 1122  PROT 6.7  ALBUMIN  4.0  AST 334*  ALT 634*  ALKPHOS 260*  BILITOT 4.1*   PT/INR No results for input(s): LABPROT, INR in the last 72  hours.   Impression/Plan: Acalculous cholecystitis with MRCP concerning for pancreatic duct and distal common bile duct stricture and possible chronic pancreatitis. Lipase pending. LFTs improving. May need ERCP and EUS early next week to further evaluate. Follow LFTs in AM. If LFTs do not continue to improve, then will need ERCP in next 1-2 days otherwise can hold off tomorrow. Clear liquid diet. NPO p MN in case ERCP needed tomorrow pending LFTs in morning. Continue supportive care.    LOS: 0 days   Jerrell JAYSON Sol  07/26/2024, 6:37 PM  Questions please call [phone]

## 2024-07-26 NOTE — Hospital Course (Signed)
 Alicia [name] is a 69 y.o.female with a history of asthma, deafness, diabetic neuropathy, gasgtroparesis, GERD, HLD, HTN who was admitted to the [facility] Medicine Teaching Service at [facility] for abdominal pain and concern for acalculous cholecystitis. Her hospital course is detailed below:  Abdominal pain Acalculous cholecystitis Patient presented with 2-week history of abdominal pain and elevated LFTs.  Found to have evidence of acute cholecystitis with dilated common bile duct but no stone on outpatient RUQ ultrasound.  MRCP was performed and showed concern for biliary stricture and pancreatic duct stricture and possible chronic pancreatitis. GI was consulted and initial ERCP failed.  A repeat ERCP was performed the following day which was successful with placement of temporary plastic biliary stent.  The following day the patient stated her abdominal pain had resolved. Due to decreased oral intake, the patient had a post-procedural AKI with a creatinine up to 1.33. This was treated with 1L bolus of LR and encouragement of oral hydration. LFTs continued to trend down during hospitalization and the patient was discharged after tolerating PO without N/V or abdominal pain.  Other chronic conditions were medically managed with home medications and formulary alternatives as necessary (T2DM)  PCP Follow-up Recommendations: Call [facility] on Monday 2/2 for update on pathology results Follow up with GI Dr. Rosalie in 6-8 weeks after discharge Recheck CMP/hepatic panel on follow up with PCP Follow up with Dr. Amalia diabetes  Risk benefit discussion with Ozempic , consider SGLT2

## 2024-07-26 NOTE — Assessment & Plan Note (Addendum)
-   Admit to [facility] med surg w/ attending Dr. Rumball - Gen surg consulted, appreciate recs - MRCP ordered - Pain control:   - Tylenol  1000mg  q6h sch  - Oxycodone  5mg  q6h prn - Given normal WBC, no fever, benign abm exam, will hold on antibiotics for now.

## 2024-07-26 NOTE — Consult Note (Signed)
 "   Reason for Consult/Chief Complaint: hyperbilirubinemia, enlarged CBD Consultant: Laurita, MD  Alicia [name] is an 69 y.o. female.   HPI: 85F underwent o/p RUQ US  and was referred to the ED based on results notable for GB sludge and enlarged CBD. Also with labs notable for Tbili 4.4, uptrending over the past week and transaminitis.  Patient reports US  was ordered due to symptoms of diffuse abdominal pain, n/v/d. She denies fever. Reports abdominal pain worse when laying flat. Prior appy and CS.  Past Medical History:  Diagnosis Date   Anemia    Arthritis    back and knees   ARTHRITIS, KNEE 09/17/2007   Asthma 04/04/2010   pt states she does not have asthma   Cataract 01/07/2019   Closed fracture of distal end of left radius 11/01/2015   Complete deafness    Meningitis at age 10   Complication of anesthesia    Deaf    Diabetes mellitus    Diabetic neuropathy (HCC)    Dysrhythmia    Ganglion cyst 06/22/2011   Gastroparesis    GERD (gastroesophageal reflux disease)    H/O: C-section    H/O: C-section 03/20/2022   Hyperlipidemia    Hypertension    PONV (postoperative nausea and vomiting)    PSVT (paroxysmal supraventricular tachycardia) 11/09/2017   Event monitor 09/14/2017 - Predominantly sinus rhythm with episodes of narrow-complex tachycardia suggestive of paroxysmal supraventricular tachycardia.   S/P appy     Past Surgical History:  Procedure Laterality Date   APPENDECTOMY     cardiolyte EF 77%, no ischemia in 2006  07/03/2004   CARPAL TUNNEL RELEASE Right 04/20/2015   Procedure: RIGHT CARPAL TUNNEL RELEASE;  Surgeon: Arley Curia, MD;  Location: [facility];  Service: Orthopedics;  Laterality: Right;   CARPAL TUNNEL RELEASE Left 11/04/2015   Procedure: CARPAL TUNNEL RELEASE;  Surgeon: Arley Curia, MD;  Location: [facility];  Service: Orthopedics;  Laterality: Left;   CERVICAL FUSION     CESAREAN SECTION     OPEN REDUCTION INTERNAL FIXATION  (ORIF) DISTAL RADIAL FRACTURE Left 11/04/2015   Procedure: OPEN REDUCTION INTERNAL FIXATION (ORIF) LEFT DISTAL RADIAL FRACTURE POSSIBLE BONE GRAFT;  Surgeon: Arley Curia, MD;  Location: [facility];  Service: Orthopedics;  Laterality: Left;   POSTERIOR CERVICAL FUSION/FORAMINOTOMY N/A 12/26/2021   Procedure: Posterior cervical fusion with lateral mass fixation - Cervical Three-Cervical Six, Cervical Laminectomy Cervical Three-Cervica; Five;  Surgeon: Joshua Alm RAMAN, MD;  Location: [facility] OR;  Service: Neurosurgery;  Laterality: N/A;   TRIGGER FINGER RELEASE Right 04/20/2015   Procedure: RELEASE TRIGGER FINGER/A-1 PULLEY RIGHT MIDDLE FINGER,RIGHT RING FINGER;  Surgeon: Arley Curia, MD;  Location: [facility];  Service: Orthopedics;  Laterality: Right;   TUBAL LIGATION     ULNAR NERVE TRANSPOSITION Right 04/20/2015   Procedure: RIGHT ULNAR NERVE DECOMPRESSION;  Surgeon: Arley Curia, MD;  Location: [facility];  Service: Orthopedics;  Laterality: Right;   UTERINE FIBROID EMBOLIZATION  07/03/2005   WRIST SURGERY     Cyst removed on left   WRIST SURGERY Right 07/04/1983   tendon repair R wrist    Family History  Problem Relation Age of Onset   Hypertension Mother    Heart attack Mother 38   Diabetes Father    Cancer Maternal Aunt    Cancer Maternal Grandmother    Neuropathy Neg Hx    Breast cancer Neg Hx    BRCA 1/2 Neg Hx  Social History:  reports that she has never smoked. She has never been exposed to tobacco smoke. She has never used smokeless tobacco. She reports current alcohol use of about 3.0 standard drinks of alcohol per week. She reports that she does not use drugs.  Allergies: Allergies[1]  Medications: I have reviewed the patient's current medications.  Results for orders placed or performed during the hospital encounter of 07/25/24 (from the past 48 hours)  CBC     Status: Abnormal   Collection Time: 07/25/24  7:18 PM  Result Value  Ref Range   WBC 7.0 4.0 - 10.5 K/uL   RBC 3.53 (L) 3.87 - 5.11 MIL/uL   Hemoglobin 10.3 (L) 12.0 - 15.0 g/dL   HCT 67.9 (L) 63.9 - 53.9 %   MCV 90.7 80.0 - 100.0 fL   MCH 29.2 26.0 - 34.0 pg   MCHC 32.2 30.0 - 36.0 g/dL   RDW 84.0 (H) 88.4 - 84.4 %   Platelets 333 150 - 400 K/uL   nRBC 0.0 0.0 - 0.2 %    Comment: Performed at [facility], [address] [place], [place] [address]  Comprehensive metabolic panel     Status: Abnormal   Collection Time: 07/25/24  7:18 PM  Result Value Ref Range   Sodium 138 135 - 145 mmol/L   Potassium 3.1 (L) 3.5 - 5.1 mmol/L   Chloride 97 (L) 98 - 111 mmol/L   CO2 27 22 - 32 mmol/L   Glucose, Bld 166 (H) 70 - 99 mg/dL    Comment: Glucose reference range applies only to samples taken after fasting for at least 8 hours.   BUN 18 8 - 23 mg/dL   Creatinine, Ser 9.01 0.44 - 1.00 mg/dL   Calcium  9.9 8.9 - 10.3 mg/dL   Total Protein 7.2 6.5 - 8.1 g/dL   Albumin  4.2 3.5 - 5.0 g/dL   AST 629 (H) 15 - 41 U/L   ALT 658 (H) 0 - 44 U/L   Alkaline Phosphatase 265 (H) 38 - 126 U/L   Total Bilirubin 4.4 (H) 0.0 - 1.2 mg/dL   GFR, Estimated >39 >39 mL/min    Comment: (NOTE) Calculated using the CKD-EPI Creatinine Equation (2021)    Anion gap 14 5 - 15    Comment: Performed at [facility], [address], [place], [place] [address]   *Note: Due to a large number of results and/or encounters for the requested time period, some results have not been displayed. A complete set of results can be found in Results Review.    US  Abdomen Limited RUQ (LIVER/GB) Result Date: 07/25/2024 CLINICAL DATA:  positive murphy sign.  Abdominal pain EXAM: ULTRASOUND ABDOMEN LIMITED RIGHT UPPER QUADRANT COMPARISON:  May 08, 2022. FINDINGS: Gallbladder: Gallbladder is distended. Positive sonographic Murphy sign noted by sonographer. No definitive cholelithiasis but likely layering sludge. Gallbladder wall thickness is at the upper limits of normal at 3 mm. Common  bile duct: Diameter: Visualized portion measures 14 mm, dilated. There are low-level echoes within the common bile duct which could reflect biliary sludge. Liver: No focal lesion identified. Within normal limits in parenchymal echogenicity. Portal vein is patent on color Doppler imaging with normal direction of blood flow towards the liver. Other: None. IMPRESSION: 1. Gallbladder is distended with positive sonographic Murphy sign. No definitive cholelithiasis but likely layering sludge. Findings are concerning for acute cholecystitis. 2. Common bile duct is dilated measuring up to 14 mm. There are low-level echoes  within the common bile duct which could reflect biliary sludge. Consider further evaluation with dedicated cross-sectional imaging such as MRI with MRCP These results will be called to the ordering clinician or representative by the Radiologist Assistant, and communication documented in the PACS or [employer]. Electronically Signed   By: Corean Salter M.D.   On: 07/25/2024 15:43    ROS 10 point review of systems is negative except as listed above in HPI.   Physical Exam Blood pressure 124/67, pulse 80, temperature 98 F (36.7 C), resp. rate 16, SpO2 99%. Constitutional: well-developed, well-nourished HEENT: pupils equal, round, reactive to light, 2mm b/l, moist conjunctiva, external inspection of ears and nose normal, hearing intact Oropharynx: normal oropharyngeal mucosa, normal dentition Neck: no thyromegaly, trachea midline, no midline cervical tenderness to palpation Chest: breath sounds equal bilaterally, normal respiratory effort, no midline or lateral chest wall tenderness to palpation/deformity Abdomen: soft, NT, no bruising, no hepatosplenomegaly GU: normal female genitalia  Back: no wounds, no thoracic/lumbar spine tenderness to palpation, no thoracic/lumbar spine stepoffs Rectal: deferred Extremities: 2+ radial and pedal pulses bilaterally, intact motor and sensation  bilateral UE and LE, no peripheral edema MSK: unable to assess gait/station, no clubbing/cyanosis of fingers/toes, normal ROM of all four extremities Skin: warm, dry, no rashes Psych: normal memory, normal mood/affect     Assessment/Plan: Hyperbilirubinemia and enlarged CBD, possible acalculous cholecystitis - does have sludge on US , but given level of bili and size of duct, will plan for MRCP. WBC normal, consider holding off on abx for now. Informed consent obtained using the assistance of ASL interpreter services. Daughter present at bedside. All questions answered from both.  FEN - CLD from my standpoint DVT - SCDs, LMWH Dispo - per primary team    Alicia GEANNIE Hanger, MD General and Trauma Surgery [facility]     [1]  Allergies Allergen Reactions   Sulfonamide Derivatives Swelling and Rash    Lost BABY - Terrible itching.    Lipitor [Atorvastatin Calcium ] Other (See Comments)    Muscle Aches - Mild-Moderate - completely resolved with D/C of atorva.    Ramipril Cough        Trulicity  [Dulaglutide ] Nausea And Vomiting   "

## 2024-07-26 NOTE — Plan of Care (Addendum)
 With MRCP findings as below placed consult to [facility] GI Dr. Dianna (pt has been seen by [facility] in the past)  He recommends obtaining lipase. He is planning to evaluate the patient this evening.  Ordered lipase. Called for ASL interpreter to try and arrange for Dr. Dianna, awaiting callback.  Also placed call to gen surg to ensure patient on their rounding list   6:20 PM received callback from Dr. Teresa with [facility] - he agrees ERCP would be best next step, agrees with GI consult. [facility] to round tomorrow   MR Abdomen MRCP W WO Contrast IMPRESSION: 1. There is moderate dilation of the extrahepatic bile duct with fluid-fluid level, which may be due to concentrated bile versus sludge. There is an approximately 1.2 cm long segment of obliterated distal extrahepatic bile duct. There is also an approximately 2.5-3 cm long segment of obliterated main pancreatic duct in the pancreatic head region. There is marked atrophy of the pancreas in the body and tail region with markedly dilated and tortuous main pancreatic duct with ectatic side branches. There is preserved pancreatic tissue in the head/uncinate process region; however, no discrete focal hypoenhancing mass seen. Findings favor stricture, which may be malignant versus benign. Correlation with tumor markers, ERCP/EUS and tissue sampling is recommended. 2. There is mild bilateral hydronephrosis with nondilated upper ureters. Findings favor component of UPJ obstruction. 3. Multiple subcentimeter sized filling defects in the left renal collecting system/renal pelvis and right kidney lower pole calyx, favored to represent calculi. 4. No metastatic disease identified within the abdomen.     Electronically Signed   By: Ree Molt M.D.   On: 07/26/2024 14:17

## 2024-07-26 NOTE — Plan of Care (Signed)
  Problem: Coping: Goal: Ability to adjust to condition or change in health will improve Outcome: Progressing   Problem: Fluid Volume: Goal: Ability to maintain a balanced intake and output will improve Outcome: Progressing   Problem: Health Behavior/Discharge Planning: Goal: Ability to identify and utilize available resources and services will improve Outcome: Progressing Goal: Ability to manage health-related needs will improve Outcome: Progressing

## 2024-07-26 NOTE — Progress Notes (Signed)
 Patient has requested to have an in person interpreter for all doctors appointments.  VRI is acceptable for short conversations with nursing staff.  MD informed

## 2024-07-26 NOTE — Consult Note (Incomplete)
 "   Reason for Consult/Chief Complaint: *** Consultant: ***, {Blank single:19197::DO,PA,MD}  Alicia [name] is an 69 y.o. female.   HPI: ***  Past Medical History:  Diagnosis Date   Anemia    Arthritis    back and knees   ARTHRITIS, KNEE 09/17/2007   Asthma 04/04/2010   pt states she does not have asthma   Cataract 01/07/2019   Closed fracture of distal end of left radius 11/01/2015   Complete deafness    Meningitis at age 70   Complication of anesthesia    Deaf    Diabetes mellitus    Diabetic neuropathy (HCC)    Dysrhythmia    Ganglion cyst 06/22/2011   Gastroparesis    GERD (gastroesophageal reflux disease)    H/O: C-section    H/O: C-section 03/20/2022   Hyperlipidemia    Hypertension    PONV (postoperative nausea and vomiting)    PSVT (paroxysmal supraventricular tachycardia) 11/09/2017   Event monitor 09/14/2017 - Predominantly sinus rhythm with episodes of narrow-complex tachycardia suggestive of paroxysmal supraventricular tachycardia.   S/P appy     Past Surgical History:  Procedure Laterality Date   APPENDECTOMY     cardiolyte EF 77%, no ischemia in 2006  07/03/2004   CARPAL TUNNEL RELEASE Right 04/20/2015   Procedure: RIGHT CARPAL TUNNEL RELEASE;  Surgeon: Arley Curia, MD;  Location: [facility];  Service: Orthopedics;  Laterality: Right;   CARPAL TUNNEL RELEASE Left 11/04/2015   Procedure: CARPAL TUNNEL RELEASE;  Surgeon: Arley Curia, MD;  Location: [facility];  Service: Orthopedics;  Laterality: Left;   CERVICAL FUSION     CESAREAN SECTION     OPEN REDUCTION INTERNAL FIXATION (ORIF) DISTAL RADIAL FRACTURE Left 11/04/2015   Procedure: OPEN REDUCTION INTERNAL FIXATION (ORIF) LEFT DISTAL RADIAL FRACTURE POSSIBLE BONE GRAFT;  Surgeon: Arley Curia, MD;  Location: [facility];  Service: Orthopedics;  Laterality: Left;   POSTERIOR CERVICAL FUSION/FORAMINOTOMY N/A 12/26/2021    Procedure: Posterior cervical fusion with lateral mass fixation - Cervical Three-Cervical Six, Cervical Laminectomy Cervical Three-Cervica; Five;  Surgeon: Joshua Alm RAMAN, MD;  Location: [facility];  Service: Neurosurgery;  Laterality: N/A;   TRIGGER FINGER RELEASE Right 04/20/2015   Procedure: RELEASE TRIGGER FINGER/A-1 PULLEY RIGHT MIDDLE FINGER,RIGHT RING FINGER;  Surgeon: Arley Curia, MD;  Location: [facility];  Service: Orthopedics;  Laterality: Right;   TUBAL LIGATION     ULNAR NERVE TRANSPOSITION Right 04/20/2015   Procedure: RIGHT ULNAR NERVE DECOMPRESSION;  Surgeon: Arley Curia, MD;  Location: [facility];  Service: Orthopedics;  Laterality: Right;   UTERINE FIBROID EMBOLIZATION  07/03/2005   WRIST SURGERY     Cyst removed on left   WRIST SURGERY Right 07/04/1983   tendon repair R wrist    Family History  Problem Relation Age of Onset   Hypertension Mother    Heart attack Mother 21   Diabetes Father    Cancer Maternal Aunt    Cancer Maternal Grandmother    Neuropathy Neg Hx    Breast cancer Neg Hx    BRCA 1/2 Neg Hx     Social History:  reports that she has never smoked. She has never been exposed to tobacco smoke. She has never used smokeless tobacco. She reports current alcohol use of about 3.0 standard drinks of alcohol per week. She reports that she does not use drugs.  Allergies: Allergies[1]  Medications: I have reviewed the patient's current medications.  Results for orders  placed or performed during the hospital encounter of 07/25/24 (from the past 48 hours)  CBC     Status: Abnormal   Collection Time: 07/25/24  7:18 PM  Result Value Ref Range   WBC 7.0 4.0 - 10.5 K/uL   RBC 3.53 (L) 3.87 - 5.11 MIL/uL   Hemoglobin 10.3 (L) 12.0 - 15.0 g/dL   HCT 67.9 (L) 63.9 - 53.9 %   MCV 90.7 80.0 - 100.0 fL   MCH 29.2 26.0 - 34.0 pg   MCHC 32.2 30.0 - 36.0 g/dL   RDW 84.0 (H) 88.4 - 84.4 %   Platelets 333 150 - 400 K/uL   nRBC 0.0  0.0 - 0.2 %    Comment: Performed at [facility], [address] [place], [place] [address]  Comprehensive metabolic panel     Status: Abnormal   Collection Time: 07/25/24  7:18 PM  Result Value Ref Range   Sodium 138 135 - 145 mmol/L   Potassium 3.1 (L) 3.5 - 5.1 mmol/L   Chloride 97 (L) 98 - 111 mmol/L   CO2 27 22 - 32 mmol/L   Glucose, Bld 166 (H) 70 - 99 mg/dL    Comment: Glucose reference range applies only to samples taken after fasting for at least 8 hours.   BUN 18 8 - 23 mg/dL   Creatinine, Ser 9.01 0.44 - 1.00 mg/dL   Calcium  9.9 8.9 - 10.3 mg/dL   Total Protein 7.2 6.5 - 8.1 g/dL   Albumin  4.2 3.5 - 5.0 g/dL   AST 629 (H) 15 - 41 U/L   ALT 658 (H) 0 - 44 U/L   Alkaline Phosphatase 265 (H) 38 - 126 U/L   Total Bilirubin 4.4 (H) 0.0 - 1.2 mg/dL   GFR, Estimated >39 >39 mL/min    Comment: (NOTE) Calculated using the CKD-EPI Creatinine Equation (2021)    Anion gap 14 5 - 15    Comment: Performed at [facility], [address], [place], [place] [address]   *Note: Due to a large number of results and/or encounters for the requested time period, some results have not been displayed. A complete set of results can be found in Results Review.    US  Abdomen Limited RUQ (LIVER/GB) Result Date: 07/25/2024 CLINICAL DATA:  positive murphy sign.  Abdominal pain EXAM: ULTRASOUND ABDOMEN LIMITED RIGHT UPPER QUADRANT COMPARISON:  May 08, 2022. FINDINGS: Gallbladder: Gallbladder is distended. Positive sonographic Murphy sign noted by sonographer. No definitive cholelithiasis but likely layering sludge. Gallbladder wall thickness is at the upper limits of normal at 3 mm. Common bile duct: Diameter: Visualized portion measures 14 mm, dilated. There are low-level echoes within the common bile duct which could reflect biliary sludge. Liver: No focal lesion identified. Within normal limits in parenchymal echogenicity. Portal vein is patent on color Doppler imaging with normal  direction of blood flow towards the liver. Other: None. IMPRESSION: 1. Gallbladder is distended with positive sonographic Murphy sign. No definitive cholelithiasis but likely layering sludge. Findings are concerning for acute cholecystitis. 2. Common bile duct is dilated measuring up to 14 mm. There are low-level echoes within the common bile duct which could reflect biliary sludge. Consider further evaluation with dedicated cross-sectional imaging such as MRI with MRCP These results will be called to the ordering clinician or representative by the Radiologist Assistant, and communication documented in the PACS or [employer]. Electronically Signed   By: Corean Salter M.D.   On: 07/25/2024 15:43  ROS 10 point review of systems is negative except as listed above in HPI.   Physical Exam Blood pressure 124/67, pulse 80, temperature 98 F (36.7 C), resp. rate 16, SpO2 99%. Constitutional: well-developed, well-nourished*** HEENT: pupils equal, round, reactive to light, 2***mm b/l, moist conjunctiva, external inspection of ears and nose normal, hearing {Blank single:19197::intact,diminished,unable to be assessed} Oropharynx: normal oropharyngeal mucosa, {Blank single:19197::normal,poor} dentition Neck: no thyromegaly, trachea midline, {Blank single:19197::+,no,unable to assess} midline cervical tenderness to palpation Chest: breath sounds equal bilaterally, {Blank single:19197::normal,absent,labored} respiratory effort, {Blank single:19197::+,no} midline or lateral chest wall tenderness to palpation/deformity Abdomen: soft, NT, no bruising, no hepatosplenomegaly GU: {Blank single:19197::no blood at urethral meatus of penis, no scrotal masses or abnormality,normal female genitalia}  Back: no wounds, {Blank single:19197::+,no,unable to assess} thoracic/lumbar spine tenderness to palpation, {Blank single:19197::+,no} thoracic/lumbar spine stepoffs Rectal:  {Blank single:19197::good tone, no blood,deferred} Extremities: 2+ radial and pedal pulses bilaterally, {Blank single:19197::intact,unable to assess} motor and sensation bilateral UE and LE, {Blank single:19197::+,no} peripheral edema MSK: {Blank single:19197::normal,abnormal,unable to assess} gait/station, no clubbing/cyanosis of fingers/toes, {Blank single:19197::normal,limited,unable to assess} ROM of all four extremities Skin: warm, dry, no rashes Psych: {Blank single:19197::normal memory, normal mood/affect,unable to assess}     Assessment/Plan: ***  *** -  FEN - {diet:29922} DVT - SCDs, {Blank single:19197::LMWH,LMWH 40BID,SQH,hold chemical ppx due to bleeding concerns} Dispo - {Blank single:19197::ICU,4NP,med-surg,***}    Dreama GEANNIE Hanger, MD General and Trauma Surgery [facility]       [1] Allergies Allergen Reactions   Sulfonamide Derivatives Swelling and Rash    Lost BABY - Terrible itching.    Lipitor [Atorvastatin Calcium ] Other (See Comments)    Muscle Aches - Mild-Moderate - completely resolved with D/C of atorva.    Ramipril Cough        Trulicity  [Dulaglutide ] Nausea And Vomiting  "

## 2024-07-26 NOTE — Assessment & Plan Note (Signed)
 Has hx of being sensitive to insulin . Will hold home insulin  and start SSI until PO improves. - very sensitive SSI - Holding home insulin , metformin , ozempic 

## 2024-07-26 NOTE — Plan of Care (Signed)
 FMTS Interim Progress Note  S: evaluated at bedside with ASL interpreter on ipad Admitted this morning Tolerating clear liquid diet Reports improvement in abd pain and bloating. Denies N/V  O: BP 123/66 (BP Location: Left Arm)   Pulse 92   Temp 98.1 F (36.7 C) (Oral)   Resp 16   SpO2 96%      General: NAD, pleasant, able to participate in exam Respiratory: No respiratory distress Abd; soft NTND Skin: warm and dry, no rashes noted Psych: Normal affect and mood   A/P:  Abdominal pain / concern for cholecystitis Sludge noted on US . Elevated AST/ALT, Tbili, alk phos. No leukocytosis or fever. Awaiting MRCP today Gen surg following Pain control with tylenol  sch and oxy prn   Romelle Booty, MD 07/26/2024, 8:24 AM PGY-3, [facility] Family Medicine Service pager [phone]

## 2024-07-26 NOTE — Plan of Care (Signed)
   Problem: Education: Goal: Knowledge of General Education information will improve Description: Including pain rating scale, medication(s)/side effects and non-pharmacologic comfort measures Outcome: Progressing   Problem: Health Behavior/Discharge Planning: Goal: Ability to manage health-related needs will improve Outcome: Progressing   Problem: Activity: Goal: Risk for activity intolerance will decrease Outcome: Progressing   Problem: Pain Managment: Goal: General experience of comfort will improve and/or be controlled Outcome: Progressing   Problem: Safety: Goal: Ability to remain free from injury will improve Outcome: Progressing

## 2024-07-26 NOTE — H&P (Signed)
 "    Hospital Admission History and Physical Service Pager: [phone]  Patient name: Alicia [name] Medical record number: [id number] Date of Birth: June 21, 1956 Age: 69 y.o. Gender: female  Primary Care Provider: Camie Dixons DO Consultants: Gen Surg Code Status:  Full  Preferred Emergency Contact: Daughter Contact Information     Name Relation Home Work Alicia [name] Daughter [phone]  [phone]   Alicia [name], Alicia [name] Son [phone]  [phone]   Alicia [name], Alicia [name] [phone]  [phone]      Other Contacts   None on File      Chief Complaint: abdominal pain  Differential and Medical Decision Making:  Alicia [name] is a 69 y.o. female presenting with abdominal pain. Differential include choledocolithiasis, biliary colic, acute pancreatitis, UTI. Choledocolithiasis is considered given CBD dilation on US , elevated LFTs and alk phose; although stable vitals and lack of abm pain now makes this less likely. Stone may have already passed. Biliary colic considered given to remittent nature of pain.  Acute pancreatitis is considered, but less likely given benign abdominal exam.  UTI also considered but less likely given lack of urinary symptoms.  Assessment & Plan Gallstone Elevated LFTs Abdominal pain - Admit to [facility] med surg w/ attending Dr. Rumball - Gen surg consulted, appreciate recs - MRCP ordered - Pain control:   - Tylenol  1000mg  q6h sch  - Oxycodone  5mg  q6h prn - Given normal WBC, no fever, benign abm exam, will hold on antibiotics for now.  T2DM (type 2 diabetes mellitus) (HCC) Has hx of being sensitive to insulin . Will hold home insulin  and start SSI until PO improves. - very sensitive SSI - Holding home insulin , metformin , ozempic    Chronic and Stable Conditions: HTN: Cont home coreg , diltiazem , hydrochlorothiazide , valsartan , ASA   FEN/GI: regular VTE Prophylaxis: SQ hep in case of surgery  Disposition: med surg  History of Present  Illness:  Alicia [name] is a 69 y.o. female presenting with abdominal pain. Reports 2-week history of abnormal orange urine, right-sided abdominal pain, abdominal distention and fullness.  She has also had loose nonbloody stools and nonbloody emesis.  She is still able to tolerate some p.o.  Denies fevers, dysuria.  Had right upper quadrant ultrasound done by PCP, which showed gallbladder distention and common bile duct dilation to 14 mm but no clear stone identified.  Patient was called and advised to go to ED for further evaluation.  In the ED, patient was given morphine  for pain control.  Vital stable.  General surgery consulted, recommended getting MRCP for further evaluation.   Pertinent Past Medical History: Hypertension, T2DM, deafness, GERD Remainder reviewed in history tab.   Pertinent Past Surgical History: History of appendectomy Remainder reviewed in history tab.  Pertinent Social History: Denies tobacco, alcohol, other drug use. Pertinent Family History: No pertinent family history  .   Important Outpatient Medications: Diltiazem  Metformin  Ozempic  Crestor  Coreg  Cymbalta  Pepcid  Gabapentin  Hydrochlorothiazide  Valsartan  Protonix    Objective: BP 124/67 (BP Location: Right Arm)   Pulse 80   Temp 98 F (36.7 C)   Resp 16   SpO2 99%  Exam: General: Alert, nontoxic appearing woman laying comfortably in bed. HEENT: MMM.  NCAT. CV: RRR, no murmurs Resp: CTAB.  Normal work of breathing on room air Abdomen: Soft, nondistended, nontender.  Nontender to deep palpation on right upper quadrant, although this was after morphine  was administered.  Labs:  CBC BMET  Recent Labs  Lab 07/25/24 1918  WBC 7.0  HGB 10.3*  HCT 32.0*  PLT 333   Recent Labs  Lab 07/25/24 1918  NA 138  K 3.1*  CL 97*  CO2 27  BUN 18  CREATININE 0.98  GLUCOSE 166*  CALCIUM  9.9         Alicia Hamlet, MD 07/26/2024, 12:12 AM PGY-3, [facility] Medicine  FPTS Intern  pager: [phone], text pages welcome Secure chat group [facility] Teaching Service     "

## 2024-07-26 NOTE — ED Notes (Signed)
 6N ready for patient.

## 2024-07-26 NOTE — Discharge Instructions (Addendum)
 Social Connections Resources   Active Adults Program https://www.[place].gov/departments/parks-recreation/active-adults-50  -Adding Health to Our Years Courses  -Aquatics  -Exercise Classes: Yoga, Dance, [place], etc.  -Book Club, Games  Locations vary by activity - Main Contact # [phone]) - see calendars on website listed above.  Senior Centers -[facility] [address] [place], [place] [address] / [phone] ext [phone] -[facility] [address], [place] [address] / [phone] [facility]) [address], [place], [place] [address] / [phone] [facility] locations in [name], [place], and [place] / [phone] -[facility] [address], [place] [address] / [phone]  Program of [facility] for the Elderly (PACE) [address] [place], [place] [address] - Office #: [phone] - Enrollment Phone #: [phone]  [facility] Line  Call toll free, available 24 hours a day, [phone]   [address]  [place] 2-1-1 is another useful way to locate resources in the community. Visit shedsizes.ch to find service information online. If you need additional assistance, 2-1-1 Referral Specialists are available 24 hours a day, every day by dialing 2-1-1 or [phone] from any phone. The call is free, confidential, and available in any language.

## 2024-07-27 ENCOUNTER — Encounter (HOSPITAL_COMMUNITY): Admission: EM | Disposition: A | Payer: Self-pay | Source: Home / Self Care | Attending: Family Medicine

## 2024-07-27 ENCOUNTER — Inpatient Hospital Stay (HOSPITAL_COMMUNITY): Payer: Medicare (Managed Care)

## 2024-07-27 ENCOUNTER — Encounter (HOSPITAL_COMMUNITY): Payer: Self-pay | Admitting: Family Medicine

## 2024-07-27 DIAGNOSIS — K819 Cholecystitis, unspecified: Secondary | ICD-10-CM | POA: Diagnosis not present

## 2024-07-27 DIAGNOSIS — G4733 Obstructive sleep apnea (adult) (pediatric): Secondary | ICD-10-CM

## 2024-07-27 DIAGNOSIS — Z789 Other specified health status: Secondary | ICD-10-CM

## 2024-07-27 DIAGNOSIS — K8051 Calculus of bile duct without cholangitis or cholecystitis with obstruction: Secondary | ICD-10-CM | POA: Diagnosis not present

## 2024-07-27 DIAGNOSIS — I10 Essential (primary) hypertension: Secondary | ICD-10-CM | POA: Diagnosis not present

## 2024-07-27 DIAGNOSIS — R933 Abnormal findings on diagnostic imaging of other parts of digestive tract: Secondary | ICD-10-CM | POA: Insufficient documentation

## 2024-07-27 DIAGNOSIS — E119 Type 2 diabetes mellitus without complications: Secondary | ICD-10-CM

## 2024-07-27 LAB — COMPREHENSIVE METABOLIC PANEL WITH GFR
ALT: 620 U/L — ABNORMAL HIGH (ref 0–44)
AST: 303 U/L — ABNORMAL HIGH (ref 15–41)
Albumin: 3.8 g/dL (ref 3.5–5.0)
Alkaline Phosphatase: 257 U/L — ABNORMAL HIGH (ref 38–126)
Anion gap: 13 (ref 5–15)
BUN: 11 mg/dL (ref 8–23)
CO2: 27 mmol/L (ref 22–32)
Calcium: 10.2 mg/dL (ref 8.9–10.3)
Chloride: 98 mmol/L (ref 98–111)
Creatinine, Ser: 0.74 mg/dL (ref 0.44–1.00)
GFR, Estimated: >60 mL/min
Glucose, Bld: 343 mg/dL — ABNORMAL HIGH (ref 70–99)
Potassium: 4.6 mmol/L (ref 3.5–5.1)
Sodium: 137 mmol/L (ref 135–145)
Total Bilirubin: 4.2 mg/dL — ABNORMAL HIGH (ref 0.0–1.2)
Total Protein: 7 g/dL (ref 6.5–8.1)

## 2024-07-27 LAB — CBC
HCT: 32.3 % — ABNORMAL LOW (ref 36.0–46.0)
Hemoglobin: 10.9 g/dL — ABNORMAL LOW (ref 12.0–15.0)
MCH: 29.9 pg (ref 26.0–34.0)
MCHC: 33.7 g/dL (ref 30.0–36.0)
MCV: 88.5 fL (ref 80.0–100.0)
Platelets: 334 10*3/uL (ref 150–400)
RBC: 3.65 MIL/uL — ABNORMAL LOW (ref 3.87–5.11)
RDW: 15.9 % — ABNORMAL HIGH (ref 11.5–15.5)
WBC: 6.5 10*3/uL (ref 4.0–10.5)
nRBC: 0 % (ref 0.0–0.2)

## 2024-07-27 LAB — GLUCOSE, CAPILLARY
Glucose-Capillary: 228 mg/dL — ABNORMAL HIGH (ref 70–99)
Glucose-Capillary: 333 mg/dL — ABNORMAL HIGH (ref 70–99)
Glucose-Capillary: 336 mg/dL — ABNORMAL HIGH (ref 70–99)
Glucose-Capillary: 367 mg/dL — ABNORMAL HIGH (ref 70–99)

## 2024-07-27 MED ORDER — ONDANSETRON HCL 4 MG/2ML IJ SOLN
INTRAMUSCULAR | Status: DC | PRN
  Administered 2024-07-27: 4 mg via INTRAVENOUS

## 2024-07-27 MED ORDER — EPHEDRINE SULFATE-NACL 50-0.9 MG/10ML-% IV SOSY
PREFILLED_SYRINGE | INTRAVENOUS | Status: DC | PRN
  Administered 2024-07-27 (×3): 5 mg via INTRAVENOUS

## 2024-07-27 MED ORDER — DEXAMETHASONE SOD PHOSPHATE PF 10 MG/ML IJ SOLN
INTRAMUSCULAR | Status: DC | PRN
  Administered 2024-07-27: 5 mg via INTRAVENOUS

## 2024-07-27 MED ORDER — ACETAMINOPHEN 325 MG PO TABS
650.0000 mg | ORAL_TABLET | ORAL | Status: DC | PRN
  Administered 2024-07-29: 650 mg via ORAL
  Filled 2024-07-27 (×2): qty 2

## 2024-07-27 MED ORDER — GLUCAGON HCL RDNA (DIAGNOSTIC) 1 MG IJ SOLR
INTRAMUSCULAR | Status: DC | PRN
  Administered 2024-07-27 (×2): .5 mg via INTRAVENOUS

## 2024-07-27 MED ORDER — INSULIN ASPART 100 UNIT/ML IJ SOLN
0.0000 [IU] | Freq: Three times a day (TID) | INTRAMUSCULAR | Status: DC
  Administered 2024-07-27: 6 [IU] via SUBCUTANEOUS
  Filled 2024-07-27: qty 0.15

## 2024-07-27 MED ORDER — INSULIN ASPART 100 UNIT/ML IJ SOLN
INTRAMUSCULAR | Status: AC
  Filled 2024-07-27: qty 4

## 2024-07-27 MED ORDER — PHENYLEPHRINE 80 MCG/ML (10ML) SYRINGE FOR IV PUSH (FOR BLOOD PRESSURE SUPPORT)
PREFILLED_SYRINGE | INTRAVENOUS | Status: DC | PRN
  Administered 2024-07-27: 160 ug via INTRAVENOUS
  Administered 2024-07-27: 200 ug via INTRAVENOUS
  Administered 2024-07-27 (×5): 160 ug via INTRAVENOUS
  Administered 2024-07-27: 200 ug via INTRAVENOUS

## 2024-07-27 MED ORDER — FENTANYL CITRATE (PF) 250 MCG/5ML IJ SOLN
INTRAMUSCULAR | Status: DC | PRN
  Administered 2024-07-27: 50 ug via INTRAVENOUS

## 2024-07-27 MED ORDER — CIPROFLOXACIN IN D5W 400 MG/200ML IV SOLN
INTRAVENOUS | Status: AC
  Filled 2024-07-27: qty 200

## 2024-07-27 MED ORDER — SODIUM CHLORIDE 0.9 % IV SOLN: INTRAVENOUS | Status: DC

## 2024-07-27 MED ORDER — ROCURONIUM BROMIDE 10 MG/ML (PF) SYRINGE
PREFILLED_SYRINGE | INTRAVENOUS | Status: DC | PRN
  Administered 2024-07-27: 30 mg via INTRAVENOUS
  Administered 2024-07-27: 10 mg via INTRAVENOUS

## 2024-07-27 MED ORDER — SUCCINYLCHOLINE CHLORIDE 200 MG/10ML IV SOSY
PREFILLED_SYRINGE | INTRAVENOUS | Status: DC | PRN
  Administered 2024-07-27: 100 mg via INTRAVENOUS

## 2024-07-27 MED ORDER — DICLOFENAC SUPPOSITORY 100 MG
RECTAL | Status: DC | PRN
  Administered 2024-07-27: 100 mg via RECTAL

## 2024-07-27 MED ORDER — LIDOCAINE 2% (20 MG/ML) 5 ML SYRINGE
INTRAMUSCULAR | Status: DC | PRN
  Administered 2024-07-27: 100 mg via INTRAVENOUS

## 2024-07-27 MED ORDER — PROPOFOL 10 MG/ML IV BOLUS
INTRAVENOUS | Status: DC | PRN
  Administered 2024-07-27: 150 mg via INTRAVENOUS

## 2024-07-27 MED ORDER — FENTANYL CITRATE (PF) 100 MCG/2ML IJ SOLN
INTRAMUSCULAR | Status: AC
  Filled 2024-07-27: qty 2

## 2024-07-27 MED ORDER — SUGAMMADEX SODIUM 200 MG/2ML IV SOLN
INTRAVENOUS | Status: DC | PRN
  Administered 2024-07-27: 150 mg via INTRAVENOUS

## 2024-07-27 MED ORDER — PHENYLEPHRINE HCL-NACL 20-0.9 MG/250ML-% IV SOLN
INTRAVENOUS | Status: DC | PRN
  Administered 2024-07-27: 50 ug/min via INTRAVENOUS

## 2024-07-27 MED ORDER — DICLOFENAC SUPPOSITORY 100 MG
RECTAL | Status: AC
  Filled 2024-07-27: qty 1

## 2024-07-27 NOTE — Op Note (Signed)
 [facility] Patient Name: Alicia [name] Procedure Date : 07/27/2024 MRN: [id number] Attending MD: Belvie Just , MD, [id number] Date of Birth: Mar 15, 1956 CSN: [id number] Age: 69 Admit Type: Inpatient Procedure:                ERCP Indications:              Common bile duct stone(s), Common bile duct                            stricture Providers:                Belvie Just, MD, Collene Edu, RN, Lorrayne Kitty,                            Technician Referring MD:              Medicines:                General Anesthesia Complications:            No immediate complications. Estimated Blood Loss:     Estimated blood loss: none. Procedure:                Pre-Anesthesia Assessment:                           - Prior to the procedure, a History and Physical                            was performed, and patient medications and                            allergies were reviewed. The patient's tolerance of                            previous anesthesia was also reviewed. The risks                            and benefits of the procedure and the sedation                            options and risks were discussed with the patient.                            All questions were answered, and informed consent                            was obtained. Prior Anticoagulants: The patient has                            taken heparin , last dose was day of procedure. ASA                            Grade Assessment: III - A patient with severe                            systemic  disease. After reviewing the risks and                            benefits, the patient was deemed in satisfactory                            condition to undergo the procedure.                           - Sedation was administered by an anesthesia                            professional. General anesthesia was attained.                           After obtaining informed consent, the scope was                             passed under direct vision. Throughout the                            procedure, the patient's blood pressure, pulse, and                            oxygen saturations were monitored continuously. The                            [id number]-[id number] ([id number]) Olympus duodenoscope was                            introduced through the mouth, and used to inject                            contrast into and used to inject contrast into the                            bile duct and ventral pancreatic duct. The ERCP was                            performed with difficulty. Scope In: Scope Out: Findings:      The major papilla was normal. A 0.035 inch x 260 cm angled stiff Hydra       Jagwire was passed into the ventral pancreatic duct.      The procedure was extremely difficult. Many attempts were made to       cannulate both the CBD and PD. The 0.035 wire, 0.025 Revolution wire,       and the 0.025 angled Revolution wire were used. A SpyBite was used to       help straighten the ducts in the intra-ampullary portion, but this       failed to aid cannulation. The best result was using the angled       Revolution wire. With rotation of the angled tip, the wire was able to       be advanced into the ventral pancreatic ducts. Enough of the wire was  advanced to allow for the wire to remain secured. The straight       Revolution wire was then used to attempt cannulation of the CBD. During       one brief moment the CBD was cannulated to the point that the       sphincterotome was able to advance in to the ampulla, however, the       duodenal motility markedly increased and wire position was lost. A       repeat attempt was made to replicate the prior maneuvers after       additional administration of glucagon . Unfortunately the PD nor the CBD       were able to be cannulated. After 1.5 hours, the procedure was concluded. Impression:               - The major papilla appeared normal. Recommendation:            - Return patient to hospital ward for ongoing care.                           - Consider a repeat ERCP versus PTC.                           - Continue to follow liver panel. Procedure Code(s):        --- Professional ---                           [phone], Endoscopic retrograde                            cholangiopancreatography (ERCP); diagnostic,                            including collection of specimen(s) by brushing or                            washing, when performed (separate procedure) Diagnosis Code(s):        --- Professional ---                           K80.51, Calculus of bile duct without cholangitis                            or cholecystitis with obstruction CPT copyright 2022 American Medical Association. All rights reserved. The codes documented in this report are preliminary and upon coder review may  be revised to meet current compliance requirements. Belvie Just, MD Belvie Just, MD 07/27/2024 3:06:56 PM This report has been signed electronically. Number of Addenda: 0

## 2024-07-27 NOTE — Assessment & Plan Note (Addendum)
 LFTs slightly improved but overall stable from yesterday. Tbili slightly up. No leukocytosis or fever. Abdominal exam stable. - GI consulted, appreciate recommendations  - Likely ERCP pending eval today - Gen surg following, appreciate recs  - signed off, pending GI workup - Pain control:  - Tylenol  650mg  q4h prn  - oxycodone  5mg  q6h prn -Monitor LFTs and WBC count

## 2024-07-27 NOTE — Assessment & Plan Note (Signed)
 Has hx of being sensitive to insulin .  - very sensitive SSI - Holding home insulin , metformin , ozempic 

## 2024-07-27 NOTE — Op Note (Signed)
 [facility] Patient Name: Alicia [name] Procedure Date : 07/27/2024 MRN: [id number] Attending MD: Belvie Just , MD, [id number] Date of Birth: 07-15-1955 CSN: [id number] Age: 69 Admit Type: Inpatient Procedure:                Upper EUS Indications:              Common bile duct dilation (acquired) seen on MRI,                            Dilated pancreatic duct on MRI, Elevated liver                            enzymes Providers:                Belvie Just, MD, Collene Edu, RN, Lorrayne Kitty,                            Technician Referring MD:              Medicines:                General Anesthesia Complications:            No immediate complications. Estimated Blood Loss:     Estimated blood loss: none. Procedure:                Pre-Anesthesia Assessment:                           - Prior to the procedure, a History and Physical                            was performed, and patient medications and                            allergies were reviewed. The patient's tolerance of                            previous anesthesia was also reviewed. The risks                            and benefits of the procedure and the sedation                            options and risks were discussed with the patient.                            All questions were answered, and informed consent                            was obtained. Prior Anticoagulants: The patient has                            taken heparin , last dose was day of procedure. ASA  Grade Assessment: III - A patient with severe                            systemic disease. After reviewing the risks and                            benefits, the patient was deemed in satisfactory                            condition to undergo the procedure.                           - Sedation was administered by an anesthesia                            professional. General anesthesia was attained.                            After obtaining informed consent, the endoscope was                            passed under direct vision. Throughout the                            procedure, the patient's blood pressure, pulse, and                            oxygen saturations were monitored continuously. The                            [id number] ([id number] Olympus endosonoscope was                            introduced through the mouth, and advanced to the                            second part of duodenum. The upper EUS was                            accomplished without difficulty. The patient                            tolerated the procedure well. Scope In: Scope Out: Findings:      ENDOSONOGRAPHIC FINDING: :      Moderate hyperechoic material consistent with sludge was visualized       endosonographically in the middle third of the main bile duct, in the       upper third of the main bile duct and in the gallbladder.      There was dilation in the middle third of the main bile duct and in the       upper third of the main bile duct which measured up to 12 mm.      The diameter of the main pancreatic duct (MPD) measured:      - HOP 3 mm (head of pancreas)      -  BOP 8 mm (body of the pancreas)      - TOP 6 mm (tail of the pancreas).      There was a significant amount of CBD and CHD dilation at 12 mm. Sludge       was noted. A transition point was noted in the head of the pancreas       region with the CBD, but there was no obvious pancreatic head mass.       Sludge was also noted in the gallbladder. The PD and the CBD were normal       in the ampullary and head of the pancreas.The neck of the PD was dilated       at 8 mm and the Body/tail of the pacreas PD was dilatd at 6 mm. It was       tortourous in this region. No evidence of any stones or masses. Impression:               - Hyperechoic material consistent with sludge was                            visualized endosonographically in the middle third                             of the main bile duct, in the upper third of the                            main bile duct and in the gallbladder.                           - There was dilation in the middle third of the                            main bile duct and in the upper third of the main                            bile duct which measured up to 12 mm.                           - Main pancreatic duct (MPD) diameter was measured.                            Endosonographically, the MPD had a tortuous                            appearance.                           - No specimens collected. Recommendation:           - Proceed with the ERCP. Procedure Code(s):        --- Professional ---                           [phone], Esophagogastroduodenoscopy, flexible,  transoral; with endoscopic ultrasound examination                            limited to the esophagus, stomach or duodenum, and                            adjacent structures Diagnosis Code(s):        --- Professional ---                           K83.8, Other specified diseases of biliary tract                           K86.89, Other specified diseases of pancreas                           R74.8, Abnormal levels of other serum enzymes                           R93.3, Abnormal findings on diagnostic imaging of                            other parts of digestive tract CPT copyright 2022 [employer]. All rights reserved. The codes documented in this report are preliminary and upon coder review may  be revised to meet current compliance requirements. Belvie Just, MD Belvie Just, MD 07/27/2024 2:58:05 PM This report has been signed electronically. Number of Addenda: 0

## 2024-07-27 NOTE — Plan of Care (Signed)

## 2024-07-27 NOTE — Anesthesia Procedure Notes (Signed)
 Procedure Name: Intubation Date/Time: 07/27/2024 12:33 PM  Performed by: Kauan Kloosterman J, CRNAPre-anesthesia Checklist: Patient identified, Emergency Drugs available, Suction available and Patient being monitored Patient Re-evaluated:Patient Re-evaluated prior to induction Oxygen Delivery Method: Circle System Utilized Preoxygenation: Pre-oxygenation with 100% oxygen Induction Type: IV induction and Rapid sequence Ventilation: Mask ventilation without difficulty Laryngoscope Size: Glidescope and 3 Grade View: Grade I Tube type: Oral Number of attempts: 1 Airway Equipment and Method: Stylet and Oral airway Placement Confirmation: ETT inserted through vocal cords under direct vision, positive ETCO2 and breath sounds checked- equal and bilateral Secured at: 21 cm Tube secured with: Tape Dental Injury: Teeth and Oropharynx as per pre-operative assessment

## 2024-07-27 NOTE — Anesthesia Preprocedure Evaluation (Addendum)
"                                    Anesthesia Evaluation  Patient identified by MRN, date of birth, ID band Patient awake    Reviewed: Allergy & Precautions, NPO status , Patient's Chart, lab work & pertinent test results  History of Anesthesia Complications (+) PONV and history of anesthetic complications  Airway Mallampati: II  TM Distance: >3 FB Neck ROM: Full    Dental  (+) Teeth Intact, Dental Advisory Given, Poor Dentition   Pulmonary    breath sounds clear to auscultation       Cardiovascular hypertension,  Rhythm:Regular Rate:Normal     Neuro/Psych    GI/Hepatic ,GERD  ,, Transaminitis 2/2 Stricture   Gastroparesis 2/2 T2DM  Biliary Stricture     Endo/Other  diabetes, Type 2, Insulin  Dependent    Renal/GU      Musculoskeletal   Abdominal   Peds  Hematology Hgb 10.9, Plts 334K (07/27/24)   Anesthesia Other Findings   Reproductive/Obstetrics                              Anesthesia Physical Anesthesia Plan  ASA: 3  Anesthesia Plan: General   Post-op Pain Management:    Induction: Intravenous and Rapid sequence  PONV Risk Score and Plan: 3 and Treatment may vary due to age or medical condition, Ondansetron  and Dexamethasone   Airway Management Planned: Oral ETT and Video Laryngoscope Planned  Additional Equipment: None  Intra-op Plan:   Post-operative Plan: Extubation in OR  Informed Consent: I have reviewed the patients History and Physical, chart, labs and discussed the procedure including the risks, benefits and alternatives for the proposed anesthesia with the patient or authorized representative who has indicated his/her understanding and acceptance.     Dental advisory given and Interpreter used for interview  Plan Discussed with: CRNA  Anesthesia Plan Comments:          Anesthesia Quick Evaluation  "

## 2024-07-27 NOTE — Anesthesia Postprocedure Evaluation (Signed)
"   Anesthesia Post Note  Patient: Alicia [name]  Procedure(s) Performed: ERCP, WITH INTERVENTION IF INDICATED ULTRASOUND, UPPER GI TRACT, ENDOSCOPIC     Patient location during evaluation: PACU Anesthesia Type: General Level of consciousness: awake Pain management: pain level controlled Vital Signs Assessment: post-procedure vital signs reviewed and stable Respiratory status: spontaneous breathing Cardiovascular status: blood pressure returned to baseline Postop Assessment: no apparent nausea or vomiting Anesthetic complications: no   No notable events documented.  Last Vitals:  Vitals:   07/27/24 1600 07/27/24 1623  BP: (!) 118/58 114/64  Pulse: 89 88  Resp: 20 18  Temp: 36.8 C 36.8 C  SpO2: 92% 92%    Last Pain:  Vitals:   07/27/24 1623  TempSrc: Oral  PainSc:                  Lauraine DASEN Colhoun      "

## 2024-07-27 NOTE — Progress Notes (Signed)
 "    Subjective/Chief Complaint: MRCP results noted GI planning ERCP   Objective: Vital signs in last 24 hours: Temp:  [97.8 F (36.6 C)-98.6 F (37 C)] 98.4 F (36.9 C) (01/25 0800) Pulse Rate:  [73-96] 96 (01/25 0800) Resp:  [14-16] 16 (01/25 0800) BP: (113-144)/(69-76) 144/69 (01/25 0800) SpO2:  [94 %-99 %] 95 % (01/25 0800) Last BM Date : 07/25/24  Intake/Output from previous day: 01/24 0701 - 01/25 0700 In: 600 [P.O.:600] Out: 500 [Urine:500] Intake/Output this shift: No intake/output data recorded.   Lab Results:  Recent Labs    07/26/24 1122 07/27/24 0524  WBC 5.6 6.5  HGB 10.3* 10.9*  HCT 31.4* 32.3*  PLT 324 334   BMET Recent Labs    07/26/24 1122 07/27/24 0524  NA 136 137  K 3.3* 4.6  CL 97* 98  CO2 28 27  GLUCOSE 309* 343*  BUN 13 11  CREATININE 0.78 0.74  CALCIUM  9.9 10.2   Lab Results  Component Value Date   LIPASE 13 07/26/2024      Latest Ref Rng & Units 07/27/2024    5:24 AM 07/26/2024   11:22 AM 07/25/2024    7:18 PM  Hepatic Function  Total Protein 6.5 - 8.1 g/dL 7.0  6.7  7.2   Albumin  3.5 - 5.0 g/dL 3.8  4.0  4.2   AST 15 - 41 U/L 303  334  370   ALT 0 - 44 U/L 620  634  658   Alk Phosphatase 38 - 126 U/L 257  260  265   Total Bilirubin 0.0 - 1.2 mg/dL 4.2  4.1  4.4      Studies/Results: MR ABDOMEN MRCP W WO CONTRAST Result Date: 07/26/2024 CLINICAL DATA:  Cholelithiasis. EXAM: MRI ABDOMEN WITHOUT AND WITH CONTRAST (INCLUDING MRCP) TECHNIQUE: Multiplanar multisequence MR imaging of the abdomen was performed both before and after the administration of intravenous contrast. Heavily T2-weighted images of the biliary and pancreatic ducts were obtained, and three-dimensional MRCP images were rendered by post processing. CONTRAST:  6mL GADAVIST  GADOBUTROL  1 MMOL/ML IV SOLN COMPARISON:  Ultrasound abdomen from 07/24/2024 and MRI abdomen from 05/08/2022. FINDINGS: Technologist noted limited exam due to patient's hearing issues and  claustrophobia. Lower chest: Unremarkable MR appearance to the lung bases. No pleural effusion. No pericardial effusion. Normal heart size. Hepatobiliary: The liver is normal in size. Noncirrhotic configuration. There is a single subcentimeter simple cyst in the left hepatic lobe, segment 4A. There is mild central intrahepatic bile duct dilation. The extrahepatic bile duct is moderately dilated measuring up to 1.4 cm in diameter in the proximal and midportion and exhibit fluid-fluid level, which may be due to concentrated bile versus sludge. However, in the distal portion, there is an approximately 1.2 cm long segment of extrahepatic bile duct which is completely obliterated. Distal to which, there is 1.8 cm long segment of nondilated distal CBD which opens into the ampulla of Vater. The gallbladder is markedly distended. However, no abnormal wall thickening or pericholecystic fat stranding/free fluid. Pancreas: There is marked atrophy of the pancreas in the body and tail region with markedly dilated and tortuous main pancreatic duct with ectatic side branches, suggesting sequela of chronic pancreatitis. However, in the region of pancreatic head, an approximately 2.5-3 cm long segment of main pancreatic duct is completely obliterated. Distal to which, there is nondilated main pancreatic duct which opens into the ampulla of Vater. There is pancreatic tissue within the head/uncinate process region however, no discrete focal hypoenhancing  mass seen. Spleen:  Within normal limits in size and appearance. No focal mass. Adrenals/Urinary Tract: Unremarkable adrenal glands. There are multiple simple cysts throughout bilateral kidneys with largest arising from the left kidney upper pole measuring up to 3.7 x 3.9 cm. There multiple subcentimeter sized T2 hypointense filling defects, at least 3 in the left renal collecting system/renal pelvis and at least 1 in the right kidney lower pole calyx, favored to represent calculi.  There is mild hydronephrosis bilaterally as well as bilateral extrarenal pelvis. However, bilateral upper ureters appear nondilated. Findings favor component of UPJ obstruction. No suspicious renal mass. Stomach/Bowel: Visualized portions within the abdomen are unremarkable. No disproportionate dilation of bowel loops. There is a small sliding hiatal hernia. Vascular/Lymphatic: No pathologically enlarged lymph nodes identified. No abdominal aortic aneurysm demonstrated. No ascites. Other:  None. Musculoskeletal: No suspicious bone lesions identified. IMPRESSION: 1. There is moderate dilation of the extrahepatic bile duct with fluid-fluid level, which may be due to concentrated bile versus sludge. There is an approximately 1.2 cm long segment of obliterated distal extrahepatic bile duct. There is also an approximately 2.5-3 cm long segment of obliterated main pancreatic duct in the pancreatic head region. There is marked atrophy of the pancreas in the body and tail region with markedly dilated and tortuous main pancreatic duct with ectatic side branches. There is preserved pancreatic tissue in the head/uncinate process region; however, no discrete focal hypoenhancing mass seen. Findings favor stricture, which may be malignant versus benign. Correlation with tumor markers, ERCP/EUS and tissue sampling is recommended. 2. There is mild bilateral hydronephrosis with nondilated upper ureters. Findings favor component of UPJ obstruction. 3. Multiple subcentimeter sized filling defects in the left renal collecting system/renal pelvis and right kidney lower pole calyx, favored to represent calculi. 4. No metastatic disease identified within the abdomen. Electronically Signed   By: Ree Molt M.D.   On: 07/26/2024 14:17    Anti-infectives: Anti-infectives (From admission, onward)    None       Assessment/Plan: Hyperbilirubinemia/ enlarged CBD Pancreatic/ distal CBD stricture  No indications for surgical  intervention GI management  Surgery will sign off for now.  Please call us  back if needed.  LOS: 1 day    Alicia [name] 07/27/2024  "

## 2024-07-27 NOTE — Transfer of Care (Signed)
 Immediate Anesthesia Transfer of Care Note  Patient: Alicia [name]  Procedure(s) Performed: ERCP, WITH INTERVENTION IF INDICATED ULTRASOUND, UPPER GI TRACT, ENDOSCOPIC  Patient Location: PACU  Anesthesia Type:General  Level of Consciousness: awake, alert , and oriented  Airway & Oxygen Therapy: Patient Spontanous Breathing and Patient connected to face mask oxygen  Post-op Assessment: Report given to RN and Post -op Vital signs reviewed and stable  Post vital signs: Reviewed and stable  Last Vitals:  Vitals Value Taken Time  BP 126/83 07/27/24 14:54  Temp    Pulse 85 07/27/24 14:58  Resp 19 07/27/24 14:58  SpO2 100 % 07/27/24 14:58  Vitals shown include unfiled device data.  Last Pain:  Vitals:   07/27/24 1148  TempSrc:   PainSc: 7       Patients Stated Pain Goal: 0 (07/27/24 1000)  Complications: No notable events documented.

## 2024-07-27 NOTE — Assessment & Plan Note (Signed)
 HTN: home coreg , diltiazem , hydrochlorothiazide , valsartan , aspirin 

## 2024-07-27 NOTE — Progress Notes (Signed)
 "    Daily Progress Note Intern Pager: [phone]  Patient name: Alicia [name] Medical record number: [id number] Date of birth: [birth year] Age: 69 y.o. Gender: female  Primary Care Provider: Lupie Credit, DO Consultants: GI, gen surg Code Status: full code  Pt Overview and Major Events to Date:  1/24 admitted  Medical Decision Making:  Alicia [name] is a 69 y.o. female presenting with abdominal pain, admitted with concern for acalculous cholecystitis now found to have concern for distal biliary stricture and pancreatic duct stricture on MRCP with possible chronic pancreatitis.   Pertinent PMH/PSH includes  asthma, deafness, diabetic neuropathy, gasgtroparesis, GERD, HLD, HTN .  Assessment & Plan Acalculous cholecystitis Abnormal MRCP / Concern for biliary and pancreatic stricture / possible chronic pancreatitis Elevated LFTs Abdominal pain LFTs slightly improved but overall stable from yesterday. Tbili slightly up. No leukocytosis or fever. Abdominal exam stable. - GI consulted, appreciate recommendations  - Likely ERCP pending eval today - Gen surg following, appreciate recs  - signed off, pending GI workup - Pain control:  - Tylenol  650mg  q4h prn  - oxycodone  5mg  q6h prn -Monitor LFTs and WBC count T2DM (type 2 diabetes mellitus) (HCC) Has hx of being sensitive to insulin .  - very sensitive SSI - Holding home insulin , metformin , ozempic  Chronic health problem HTN: home coreg , diltiazem , hydrochlorothiazide , valsartan , aspirin    FEN/GI: NPO pending possible ERCP PPx: heparin  subq Dispo:pending continued workup and possible procedure   Subjective:  Seen with in person ASL interpreter  NAEON endorses some lower abdominal pain / low back pain this morning but overall feels fine. Able to sleep last night. Hasn't had anything to eat/drink per NPO order.  Objective: Temp:  [97.8 F (36.6 C)-98.6 F (37 C)] 98.6 F (37 C) (01/25 0445) Pulse Rate:  [73-92] 83 (01/25  0445) Resp:  [14-16] 15 (01/24 2026) BP: (113-138)/(69-76) 138/76 (01/25 0445) SpO2:  [94 %-99 %] 94 % (01/25 0445) Physical Exam: General: NAD HEENT: scleral icterus noted Cardiovascular: RRR Respiratory: CTAB normal WOB on RA Abdomen: soft NTND negative murphy Extremities: no significant edema  Laboratory: Most recent CBC Lab Results  Component Value Date   WBC 6.5 07/27/2024   HGB 10.9 (L) 07/27/2024   HCT 32.3 (L) 07/27/2024   MCV 88.5 07/27/2024   PLT 334 07/27/2024   Most recent BMP    Latest Ref Rng & Units 07/27/2024    5:24 AM  BMP  Glucose 70 - 99 mg/dL 656   BUN 8 - 23 mg/dL 11   Creatinine 9.55 - 1.00 mg/dL 9.25   Sodium 864 - 854 mmol/L 137   Potassium 3.5 - 5.1 mmol/L 4.6   Chloride 98 - 111 mmol/L 98   CO2 22 - 32 mmol/L 27   Calcium  8.9 - 10.3 mg/dL 89.7     Lipase 13   Imaging/Diagnostic Tests:  MRCP 1/24: IMPRESSION: 1. There is moderate dilation of the extrahepatic bile duct with fluid-fluid level, which may be due to concentrated bile versus sludge. There is an approximately 1.2 cm long segment of obliterated distal extrahepatic bile duct. There is also an approximately 2.5-3 cm long segment of obliterated main pancreatic duct in the pancreatic head region. There is marked atrophy of the pancreas in the body and tail region with markedly dilated and tortuous main pancreatic duct with ectatic side branches. There is preserved pancreatic tissue in the head/uncinate process region; however, no discrete focal hypoenhancing mass seen. Findings favor stricture, which may be malignant versus benign.  Correlation with tumor markers, ERCP/EUS and tissue sampling is recommended. 2. There is mild bilateral hydronephrosis with nondilated upper ureters. Findings favor component of UPJ obstruction. 3. Multiple subcentimeter sized filling defects in the left renal collecting system/renal pelvis and right kidney lower pole calyx, favored to represent  calculi. 4. No metastatic disease identified within the abdomen.     Electronically Signed   By: Ree Molt M.D.   On: 07/26/2024 14:17     Romelle Booty, MD 07/27/2024, 7:59 AM  PGY-3, [facility] FPTS Intern pager: [phone], text pages welcome Secure chat group [facility] Teaching Service  "

## 2024-07-28 ENCOUNTER — Inpatient Hospital Stay (HOSPITAL_COMMUNITY): Payer: Medicare (Managed Care) | Admitting: Anesthesiology

## 2024-07-28 ENCOUNTER — Encounter (HOSPITAL_COMMUNITY): Admission: EM | Disposition: A | Payer: Self-pay | Source: Home / Self Care | Attending: Family Medicine

## 2024-07-28 ENCOUNTER — Encounter (HOSPITAL_COMMUNITY): Payer: Self-pay | Admitting: Gastroenterology

## 2024-07-28 ENCOUNTER — Inpatient Hospital Stay (HOSPITAL_COMMUNITY): Payer: Medicare (Managed Care)

## 2024-07-28 DIAGNOSIS — J45909 Unspecified asthma, uncomplicated: Secondary | ICD-10-CM

## 2024-07-28 DIAGNOSIS — K819 Cholecystitis, unspecified: Secondary | ICD-10-CM | POA: Diagnosis not present

## 2024-07-28 DIAGNOSIS — K8051 Calculus of bile duct without cholangitis or cholecystitis with obstruction: Secondary | ICD-10-CM | POA: Diagnosis not present

## 2024-07-28 DIAGNOSIS — I4891 Unspecified atrial fibrillation: Secondary | ICD-10-CM | POA: Diagnosis not present

## 2024-07-28 DIAGNOSIS — I10 Essential (primary) hypertension: Secondary | ICD-10-CM | POA: Diagnosis not present

## 2024-07-28 LAB — HEPATIC FUNCTION PANEL
ALT: 526 U/L — ABNORMAL HIGH (ref 0–44)
AST: 193 U/L — ABNORMAL HIGH (ref 15–41)
Albumin: 3.8 g/dL (ref 3.5–5.0)
Alkaline Phosphatase: 253 U/L — ABNORMAL HIGH (ref 38–126)
Bilirubin, Direct: 2.7 mg/dL — ABNORMAL HIGH (ref 0.0–0.2)
Indirect Bilirubin: 0.7 mg/dL (ref 0.3–0.9)
Total Bilirubin: 3.4 mg/dL — ABNORMAL HIGH (ref 0.0–1.2)
Total Protein: 6.8 g/dL (ref 6.5–8.1)

## 2024-07-28 LAB — CBC
HCT: 33.4 % — ABNORMAL LOW (ref 36.0–46.0)
Hemoglobin: 10.9 g/dL — ABNORMAL LOW (ref 12.0–15.0)
MCH: 29.5 pg (ref 26.0–34.0)
MCHC: 32.6 g/dL (ref 30.0–36.0)
MCV: 90.5 fL (ref 80.0–100.0)
Platelets: 353 10*3/uL (ref 150–400)
RBC: 3.69 MIL/uL — ABNORMAL LOW (ref 3.87–5.11)
RDW: 16.1 % — ABNORMAL HIGH (ref 11.5–15.5)
WBC: 10.4 10*3/uL (ref 4.0–10.5)
nRBC: 0 % (ref 0.0–0.2)

## 2024-07-28 LAB — GLUCOSE, CAPILLARY
Glucose-Capillary: 422 mg/dL — ABNORMAL HIGH (ref 70–99)
Glucose-Capillary: 331 mg/dL — ABNORMAL HIGH (ref 70–99)
Glucose-Capillary: 305 mg/dL — ABNORMAL HIGH (ref 70–99)
Glucose-Capillary: 223 mg/dL — ABNORMAL HIGH (ref 70–99)
Glucose-Capillary: 352 mg/dL — ABNORMAL HIGH (ref 70–99)
Glucose-Capillary: 440 mg/dL — ABNORMAL HIGH (ref 70–99)
Glucose-Capillary: 406 mg/dL — ABNORMAL HIGH (ref 70–99)
Glucose-Capillary: 396 mg/dL — ABNORMAL HIGH (ref 70–99)

## 2024-07-28 MED ORDER — ROCURONIUM BROMIDE 10 MG/ML (PF) SYRINGE
PREFILLED_SYRINGE | INTRAVENOUS | Status: DC | PRN
  Administered 2024-07-28: 50 mg via INTRAVENOUS
  Administered 2024-07-28: 20 mg via INTRAVENOUS

## 2024-07-28 MED ORDER — SODIUM CHLORIDE 0.9 % IV BOLUS
500.0000 mL | Freq: Once | INTRAVENOUS | Status: AC
  Administered 2024-07-28: 500 mL via INTRAVENOUS

## 2024-07-28 MED ORDER — GLUCAGON HCL RDNA (DIAGNOSTIC) 1 MG IJ SOLR
INTRAMUSCULAR | Status: AC
  Filled 2024-07-28: qty 1

## 2024-07-28 MED ORDER — LACTATED RINGERS IV SOLN
INTRAVENOUS | Status: DC | PRN
  Administered 2024-07-28: 1000 mL via INTRAVENOUS

## 2024-07-28 MED ORDER — SODIUM CHLORIDE 0.9 % IV SOLN
INTRAVENOUS | Status: DC | PRN
  Administered 2024-07-28: 30 mL

## 2024-07-28 MED ORDER — SUGAMMADEX SODIUM 200 MG/2ML IV SOLN
INTRAVENOUS | Status: DC | PRN
  Administered 2024-07-28: 200 mg via INTRAVENOUS

## 2024-07-28 MED ORDER — ONDANSETRON HCL 4 MG/2ML IJ SOLN
INTRAMUSCULAR | Status: DC | PRN
  Administered 2024-07-28: 4 mg via INTRAVENOUS

## 2024-07-28 MED ORDER — PHENYLEPHRINE HCL-NACL 20-0.9 MG/250ML-% IV SOLN
INTRAVENOUS | Status: DC | PRN
  Administered 2024-07-28: 50 ug/min via INTRAVENOUS

## 2024-07-28 MED ORDER — PROPOFOL 10 MG/ML IV BOLUS
INTRAVENOUS | Status: DC | PRN
  Administered 2024-07-28: 150 mg via INTRAVENOUS

## 2024-07-28 MED ORDER — LIDOCAINE 2% (20 MG/ML) 5 ML SYRINGE
INTRAMUSCULAR | Status: DC | PRN
  Administered 2024-07-28: 100 mg via INTRAVENOUS

## 2024-07-28 MED ORDER — SUCCINYLCHOLINE CHLORIDE 200 MG/10ML IV SOSY
PREFILLED_SYRINGE | INTRAVENOUS | Status: DC | PRN
  Administered 2024-07-28: 100 mg via INTRAVENOUS

## 2024-07-28 MED ORDER — INSULIN GLARGINE 100 UNIT/ML ~~LOC~~ SOLN
4.0000 [IU] | Freq: Every day | SUBCUTANEOUS | Status: DC
  Administered 2024-07-28: 4 [IU] via SUBCUTANEOUS
  Filled 2024-07-28: qty 0.04

## 2024-07-28 MED ORDER — DICLOFENAC SUPPOSITORY 100 MG
RECTAL | Status: DC | PRN
  Administered 2024-07-28: 100 mg via RECTAL

## 2024-07-28 MED ORDER — INSULIN ASPART 100 UNIT/ML IJ SOLN
5.0000 [IU] | Freq: Once | INTRAMUSCULAR | Status: AC
  Administered 2024-07-28: 5 [IU] via SUBCUTANEOUS
  Filled 2024-07-28: qty 5

## 2024-07-28 MED ORDER — EPHEDRINE SULFATE-NACL 50-0.9 MG/10ML-% IV SOSY
PREFILLED_SYRINGE | INTRAVENOUS | Status: DC | PRN
  Administered 2024-07-28 (×2): 10 mg via INTRAVENOUS

## 2024-07-28 MED ORDER — DEXAMETHASONE SOD PHOSPHATE PF 10 MG/ML IJ SOLN
INTRAMUSCULAR | Status: DC | PRN
  Administered 2024-07-28: 4 mg via INTRAVENOUS

## 2024-07-28 MED ORDER — PHENYLEPHRINE 80 MCG/ML (10ML) SYRINGE FOR IV PUSH (FOR BLOOD PRESSURE SUPPORT)
PREFILLED_SYRINGE | INTRAVENOUS | Status: DC | PRN
  Administered 2024-07-28 (×3): 160 ug via INTRAVENOUS
  Administered 2024-07-28 (×2): 240 ug via INTRAVENOUS

## 2024-07-28 MED ORDER — DICLOFENAC SUPPOSITORY 100 MG
RECTAL | Status: AC
  Filled 2024-07-28: qty 1

## 2024-07-28 MED ORDER — PIPERACILLIN-TAZOBACTAM 3.375 G IVPB 30 MIN
3.3750 g | Freq: Once | INTRAVENOUS | Status: AC
  Administered 2024-07-28: 3.375 g via INTRAVENOUS
  Filled 2024-07-28: qty 50

## 2024-07-28 MED ORDER — ALBUMIN HUMAN 5 % IV SOLN: INTRAVENOUS | Status: DC | PRN

## 2024-07-28 NOTE — Assessment & Plan Note (Signed)
 LFTs and tbili improving this morning. No leukocytosis or fever. Abdominal exam stable. ERCP was performed 1/25. - GI consulted, appreciate recommendations  - considering repeat ERCP vs PTC - Gen surg signed off pending GI workup - Pain control:  - Tylenol  650mg  q4h prn  - oxycodone  5mg  q6h prn -Monitor LFTs and WBC count

## 2024-07-28 NOTE — Progress Notes (Signed)
" ° ° ° °  Daily Progress Note Intern Pager: [phone]  Patient name: Alicia [name] Medical record number: [id number] Date of birth: 07/08/1955 Age: 69 y.o. Gender: female  Primary Care Provider: Lupie Credit, DO Consultants: GI, surgery (s/o) Code Status: full code  Pt Overview and Major Events to Date:  1/24 admitted 1/25 ERCP  Medical Decision Making:  Alicia [name] is a 69 y.o. female presenting with abdominal pain, admitted with concern for acalculous cholecystitis now being worked up for distal biliary stricture and pancreatic duct stricture noted on MRCP with possible chronic pancreatitis.  ERCP performed 1/25 with plan for potential repeat ERCP versus PTC.SABRA   Pertinent PMH/PSH includes asthma, deafness, diabetic neuropathy, gastroparesis, GERD, HLD, HTN.  Assessment & Plan Acalculous cholecystitis Abnormal MRCP / Concern for biliary and pancreatic stricture / possible chronic pancreatitis Elevated LFTs Abdominal pain LFTs and tbili improving this morning. No leukocytosis or fever. Abdominal exam stable. ERCP was performed 1/25. - GI consulted, appreciate recommendations  - considering repeat ERCP vs PTC - Gen surg signed off pending GI workup - Pain control:  - Tylenol  650mg  q4h prn  - oxycodone  5mg  q6h prn -Monitor LFTs and WBC count T2DM (type 2 diabetes mellitus) (HCC) Has hx of being sensitive to insulin .  - very sensitive SSI - Holding home insulin , metformin , ozempic  - has been hyperglycemic here - consider increasing insulin  especially once she is not NPO Chronic health problem HTN: home coreg , diltiazem , hydrochlorothiazide , valsartan , aspirin     FEN/GI: NPO pending GI eval PPx: subcutaneous heparin  Dispo:pending continued workup  Subjective:  Seen with ASL interpreter. NAEON denies acute concerns today. Her lip is a little irritated and her throat hurts from the procedure yesterday. Understands plan for GI eval today and possible repeat  procedure.  Objective: Temp:  [97.9 F (36.6 C)-99.6 F (37.6 C)] 97.9 F (36.6 C) (01/26 0500) Pulse Rate:  [84-96] 88 (01/26 0500) Resp:  [13-20] 18 (01/26 0500) BP: (110-144)/(54-83) 118/64 (01/26 0500) SpO2:  [92 %-98 %] 95 % (01/26 0500) Physical Exam: General: NAD Cardiovascular: RRR Respiratory: CTAB normal WOB on RA Abdomen: soft NTND Extremities: moves all extremities  Laboratory: Most recent CBC Lab Results  Component Value Date   WBC 10.4 07/28/2024   HGB 10.9 (L) 07/28/2024   HCT 33.4 (L) 07/28/2024   MCV 90.5 07/28/2024   PLT 353 07/28/2024   Most recent BMP    Latest Ref Rng & Units 07/27/2024    5:24 AM  BMP  Glucose 70 - 99 mg/dL 656   BUN 8 - 23 mg/dL 11   Creatinine 9.55 - 1.00 mg/dL 9.25   Sodium 864 - 854 mmol/L 137   Potassium 3.5 - 5.1 mmol/L 4.6   Chloride 98 - 111 mmol/L 98   CO2 22 - 32 mmol/L 27   Calcium  8.9 - 10.3 mg/dL 89.7      Romelle Booty, MD 07/28/2024, 7:37 AM  PGY-3, [facility] FPTS Intern pager: [phone], text pages welcome Secure chat group [facility] Teaching Service  "

## 2024-07-28 NOTE — Care Management (Signed)
 Sdoh resources added to AVS for social isolation

## 2024-07-28 NOTE — Anesthesia Postprocedure Evaluation (Signed)
"   Anesthesia Post Note  Patient: Alicia [name]  Procedure(s) Performed: ERCP, WITH INTERVENTION IF INDICATED BRUSH BIOPSY, BILE DUCT INSERTION, STENT, BILE DUCT     Patient location during evaluation: Endoscopy Anesthesia Type: General Level of consciousness: awake and alert Pain management: pain level controlled Vital Signs Assessment: post-procedure vital signs reviewed and stable Respiratory status: spontaneous breathing, nonlabored ventilation, respiratory function stable and patient connected to nasal cannula oxygen Cardiovascular status: blood pressure returned to baseline and stable Postop Assessment: no apparent nausea or vomiting Anesthetic complications: no   There were no known notable events for this encounter.  Last Vitals:  Vitals:   07/28/24 1510 07/28/24 1520  BP: 105/64 (!) 103/55  Pulse: 89 87  Resp: 14 19  Temp:    SpO2: 95% 95%    Last Pain:  Vitals:   07/28/24 1520  TempSrc:   PainSc: 0-No pain                 Math Brazie S      "

## 2024-07-28 NOTE — Plan of Care (Signed)
 FMTS Brief Progress Note  S:Night rounded on patient after episode of hypotension with BP 95/49, MAP 63, now s/p 500cc bolus.   O: BP (!) 106/58 (BP Location: Left Arm)   Pulse 91   Temp 98.5 F (36.9 C) (Oral)   Resp 13   Ht 5' 4 (1.626 m)   Wt 60.2 kg   SpO2 100%   BMI 22.78 kg/m    General: A&Ox3, tired-appearing, NAD HEENT: EOMI Respiratory: Normal effort of breathing on RA Abdomen: Non-distended Extremities: Moves all four extremities appropriately  A/P: Pressures improved to BP 106/58, MAP 72 after bolus. Patient still reports feeling tired and does appear fatigued but suspect this is due to her still recovering from anesthesia from ERCP earlier today and possible dehydration as she has had little PO intake since procedure - Continue monitoring BP - If still hypotensive, can consider obtaining manual BP and additional bolus - CBG 406 after additional 5u short-acting insulin  ~8pm - Will recheck in 1 hour and consider additional 5u short-acting insulin  if CBG still elevated - Consider adjusting insulin  regimen in AM for adequate blood glucose control - rest of plan per day team   Jerrie Gathers, DO 07/28/2024, 9:49 PM PGY-1, [facility] Family Medicine Night Resident  Please page [phone] with questions.

## 2024-07-28 NOTE — Progress Notes (Signed)
 Alicia [name] 1:02 PM  Subjective: Patient seen and examined and case discussed with my partner Dr. Elicia and her hospital computer chart reviewed and other than a sore throat and a swollen lip she had no problems from her yesterday ERCP which we reviewed and she has no questions and did have a normal bowel movement earlier today  Objective: Vital signs stable afebrile no acute distress exam please see preassessment of evaluation labs MRCP reviewed as well as yesterday's ERCP  Assessment: CBD stricture  Plan: Okay to retry ERCP with anesthesia assistance  [facility]  office [phone] After 5PM or if no answer call [phone]

## 2024-07-28 NOTE — Assessment & Plan Note (Addendum)
 Has hx of being sensitive to insulin .  - very sensitive SSI - Holding home insulin , metformin , ozempic  - has been hyperglycemic here - consider increasing insulin  especially once she is not NPO

## 2024-07-28 NOTE — Transfer of Care (Signed)
 Immediate Anesthesia Transfer of Care Note  Patient: Alicia [name]  Procedure(s) Performed: ERCP, WITH INTERVENTION IF INDICATED BRUSH BIOPSY, BILE DUCT INSERTION, STENT, BILE DUCT  Patient Location: Endoscopy Unit  Anesthesia Type:General  Level of Consciousness: awake, alert , and oriented  Airway & Oxygen Therapy: Patient Spontanous Breathing and Patient connected to nasal cannula oxygen  Post-op Assessment: Report given to RN and Post -op Vital signs reviewed and stable  Post vital signs: Reviewed and stable  Last Vitals:  Vitals Value Taken Time  BP 118/69 07/28/24 14:47  Temp    Pulse 87 07/28/24 14:49  Resp 20 07/28/24 14:48  SpO2 95 % 07/28/24 14:49  Vitals shown include unfiled device data.  Last Pain:  Vitals:   07/28/24 1309  TempSrc: Temporal  PainSc: 0-No pain      Patients Stated Pain Goal: 0 (07/27/24 2045)  Complications: No notable events documented.

## 2024-07-28 NOTE — Op Note (Signed)
 [facility] Patient Name: Alicia [name] Procedure Date : 07/28/2024 MRN: [id number] Attending MD: Oliva Boots , MD, [id number] Date of Birth: [birth year] CSN: [id number] Age: 69 Admit Type: Inpatient Procedure:                ERCP Indications:              Common bile duct stricture, Abnormal MRCP,                            Jaundice, Elevated liver enzymes Providers:                Oliva Boots, MD, Ozell Pouch, Mliss Eagles, RN Referring MD:              Medicines:                General Anesthesia Complications:            No immediate complications. Estimated Blood Loss:     Estimated blood loss: none. Procedure:                Pre-Anesthesia Assessment:                           - Prior to the procedure, a History and Physical                            was performed, and patient medications and                            allergies were reviewed. The patient's tolerance of                            previous anesthesia was also reviewed. The risks                            and benefits of the procedure and the sedation                            options and risks were discussed with the patient.                            All questions were answered, and informed consent                            was obtained. Prior Anticoagulants: The patient has                            taken no anticoagulant or antiplatelet agents. ASA                            Grade Assessment: III - A patient with severe                            systemic disease. After reviewing the risks and  benefits, the patient was deemed in satisfactory                            condition to undergo the procedure.                           After obtaining informed consent, the scope was                            passed under direct vision. Throughout the                            procedure, the patient's blood pressure, pulse, and                            oxygen  saturations were monitored continuously. The                            [id number]-[id number] ([id number] Olympus duodenoscope was                            introduced through the mouth, and used to inject                            contrast into and used to cannulate the bile duct.                            The ERCP was somewhat difficult due to challenging                            cannulation because of abnormal anatomy. Successful                            completion of the procedure was aided by performing                            the maneuvers documented (below) in this report.                            The patient tolerated the procedure well. Scope In: Scope Out: Findings:      The major papilla was normal. We tried to cannulate with the regular       sphincterotome and regular wire initially could not advance the wire so       we switched to the smaller sphincterotome and smaller wire and were able       to get deep selective cannulation after we proceeded with A biliary       pre-cut sphincterotomy was made with a Hydratome sphincterotome using       ERBE electrocautery. There was no post-sphincterotomy bleeding. We did       this when the sphincterotome was pointing in the right direction but the       wire could not be advanced and we were not filling the pancreatic duct       on injection but we were not filling  the the bile duct either and once       deep selective cannulation was obtained of the biliary sphincterotomy       was extended with a Hydratome sphincterotome using ERBE electrocautery.       There was no post-sphincterotomy bleeding. Unfortunately at this point       we lost fluoroscopy and while we were waiting to establish it the wire       fell out of the duct and we did have to recannulate and once done cells       for cytology were obtained by brushing in the lower third of the main       bile duct. One 10 Fr by 7 cm temporary plastic biliary stent with a        single external flap and a single internal flap was placed 6 cm into the       common bile duct. Dark bile flowed through the stent. The stent was in       good position. The wire and introducer were removed and the scope was       removed and the patient tolerated the procedure well there was no       obvious immediate complication there was no obvious wire advancement or       injection of the pancreas during the procedure Impression:               - The major papilla appeared normal.                           - A precut biliary sphincterotomy was performed.                           - A biliary sphincterotomy extension was performed                            after selective cannulation                           - Cells for cytology obtained in the lower third of                            the main duct.                           - One temporary plastic biliary stent was placed                            into the common bile duct. Recommendation:           - Clear liquid diet today. If doing well in a.m.                            may have soft solid                           - Continue present medications.                           - Await cytology results. Repeat labs in a.m.  and                            follow liver tests back to near normal as an                            outpatient                           - Telephone GI clinic for pathology results in 5                            days.                           - Telephone GI clinic if symptomatic PRN.                           - Return to GI clinic PRN. My partner Dr.                            Elicia will round on tomorrow Procedure Code(s):        --- Professional ---                           [phone], Endoscopic retrograde                            cholangiopancreatography (ERCP); with placement of                            endoscopic stent into biliary or pancreatic duct,                            including pre-  and post-dilation and guide wire                            passage, when performed, including sphincterotomy,                            when performed, each stent Diagnosis Code(s):        --- Professional ---                           K83.1, Obstruction of bile duct                           R17, Unspecified jaundice                           R74.8, Abnormal levels of other serum enzymes                           R93.2, Abnormal findings on diagnostic imaging of                            liver and  biliary tract CPT copyright 2022 American Medical Association. All rights reserved. The codes documented in this report are preliminary and upon coder review may  be revised to meet current compliance requirements. Oliva Boots, MD 07/28/2024 2:59:45 PM This report has been signed electronically. Number of Addenda: 0

## 2024-07-28 NOTE — Anesthesia Preprocedure Evaluation (Addendum)
"                                    Anesthesia Evaluation  Patient identified by MRN, date of birth, ID band Patient awake    Reviewed: Allergy & Precautions, H&P , NPO status , Patient's Chart, lab work & pertinent test results  History of Anesthesia Complications (+) PONV and history of anesthetic complications  Airway Mallampati: II   Neck ROM: full    Dental   Pulmonary asthma , sleep apnea    breath sounds clear to auscultation       Cardiovascular hypertension, Pt. on medications + dysrhythmias Atrial Fibrillation  Rhythm:regular Rate:Normal     Neuro/Psych Seizures -,   Neuromuscular disease  negative psych ROS   GI/Hepatic negative GI ROS, Neg liver ROS,GERD  Controlled,,  Endo/Other  diabetes Hyperthyroidism   Renal/GU negative Renal ROS  negative genitourinary   Musculoskeletal  (+) Arthritis ,    Abdominal   Peds negative pediatric ROS (+)  Hematology  (+) Blood dyscrasia, anemia Hb 10.9, plt 353   Anesthesia Other Findings   Reproductive/Obstetrics negative OB ROS                              Anesthesia Physical Anesthesia Plan  ASA: 3  Anesthesia Plan: General   Post-op Pain Management:    Induction: Intravenous  PONV Risk Score and Plan: 4 or greater and Ondansetron , Dexamethasone  and Treatment may vary due to age or medical condition  Airway Management Planned: Oral ETT  Additional Equipment:   Intra-op Plan:   Post-operative Plan: Extubation in OR  Informed Consent: I have reviewed the patients History and Physical, chart, labs and discussed the procedure including the risks, benefits and alternatives for the proposed anesthesia with the patient or authorized representative who has indicated his/her understanding and acceptance.     Dental advisory given  Plan Discussed with: CRNA, Anesthesiologist and Surgeon  Anesthesia Plan Comments:         Anesthesia Quick Evaluation  "

## 2024-07-28 NOTE — Plan of Care (Addendum)
 FMTS Interim Progress Note  To bedside - patient BP 90s/40s, MAP 63 Patient somewhat more tired/lethargic than usual per RN Has not had much to eat/drink since her procedure, or all day  Vitals:   07/28/24 1800 07/28/24 1816  BP: (!) 95/49 (!) 95/48  Pulse: 92   Resp:    Temp: 97.8 F (36.6 C)   SpO2: 100%    Suspect possible dehydration  patient also post ERCP today HR normal, afebrile  Ordered 500cc bolus and CBG check Night team to reevaluate    Romelle Booty, MD 07/28/2024, 6:56 PM PGY-3, [facility] Family Medicine Service pager [phone]

## 2024-07-28 NOTE — Plan of Care (Signed)
" °  Problem: Education: Goal: Ability to describe self-care measures that may prevent or decrease complications (Diabetes Survival Skills Education) will improve 07/28/2024 0432 by Wyvonne Dines, RN Outcome: Progressing 07/28/2024 0432 by Wyvonne Dines, RN Outcome: Progressing Goal: Individualized Educational Video(s) 07/28/2024 0432 by Wyvonne Dines, RN Outcome: Progressing 07/28/2024 0432 by Wyvonne Dines, RN Outcome: Progressing   Problem: Coping: Goal: Ability to adjust to condition or change in health will improve 07/28/2024 0432 by Wyvonne Dines, RN Outcome: Progressing 07/28/2024 0432 by Wyvonne Dines, RN Outcome: Progressing   Problem: Fluid Volume: Goal: Ability to maintain a balanced intake and output will improve 07/28/2024 0432 by Wyvonne Dines, RN Outcome: Progressing 07/28/2024 0432 by Wyvonne Dines, RN Outcome: Progressing   Problem: Health Behavior/Discharge Planning: Goal: Ability to identify and utilize available resources and services will improve 07/28/2024 0432 by Wyvonne Dines, RN Outcome: Progressing 07/28/2024 0432 by Wyvonne Dines, RN Outcome: Progressing Goal: Ability to manage health-related needs will improve 07/28/2024 0432 by Wyvonne Dines, RN Outcome: Progressing 07/28/2024 0432 by Wyvonne Dines, RN Outcome: Progressing   Problem: Metabolic: Goal: Ability to maintain appropriate glucose levels will improve Outcome: Progressing   Problem: Nutritional: Goal: Maintenance of adequate nutrition will improve Outcome: Progressing Goal: Progress toward achieving an optimal weight will improve Outcome: Progressing   Problem: Skin Integrity: Goal: Risk for impaired skin integrity will decrease Outcome: Progressing   Problem: Tissue Perfusion: Goal: Adequacy of tissue perfusion will improve Outcome: Progressing   Problem: Education: Goal: Knowledge of General Education information will improve Description: Including pain rating scale, medication(s)/side  effects and non-pharmacologic comfort measures Outcome: Progressing   Problem: Health Behavior/Discharge Planning: Goal: Ability to manage health-related needs will improve Outcome: Progressing   Problem: Clinical Measurements: Goal: Ability to maintain clinical measurements within normal limits will improve Outcome: Progressing Goal: Will remain free from infection Outcome: Progressing Goal: Diagnostic test results will improve Outcome: Progressing Goal: Respiratory complications will improve Outcome: Progressing Goal: Cardiovascular complication will be avoided Outcome: Progressing   Problem: Activity: Goal: Risk for activity intolerance will decrease Outcome: Progressing   Problem: Nutrition: Goal: Adequate nutrition will be maintained Outcome: Progressing   Problem: Coping: Goal: Level of anxiety will decrease Outcome: Progressing   Problem: Elimination: Goal: Will not experience complications related to bowel motility Outcome: Progressing Goal: Will not experience complications related to urinary retention Outcome: Progressing   Problem: Pain Managment: Goal: General experience of comfort will improve and/or be controlled Outcome: Progressing   Problem: Safety: Goal: Ability to remain free from injury will improve Outcome: Progressing   Problem: Skin Integrity: Goal: Risk for impaired skin integrity will decrease Outcome: Progressing   "

## 2024-07-28 NOTE — Progress Notes (Signed)
 [facility] Gastroenterology Progress Note  Alicia [name] 69 y.o. [birth year]  CC: Abdominal pain, abnormal LFTs, biliary stricture   Subjective: Patient seen and examined at bedside.  Sign language interpreter in person was used.  Had to call him multiple times and with at least 30 minutes on the floor to get him back to the hospital.  Patient's abdominal pain has resolved.  Having regular bowel movements.  She is willing to have another attempt at ERCP.  ROS : Afebrile, negative for chest pain   Objective: Vital signs in last 24 hours: Vitals:   07/27/24 2050 07/28/24 0500  BP: (!) 114/54 118/64  Pulse: 92 88  Resp: 18 18  Temp: 98.1 F (36.7 C) 97.9 F (36.6 C)  SpO2: 92% 95%    Physical Exam:  General:  Alert, cooperative, no distress, appears stated age  Head:  Normocephalic, without obvious abnormality, atraumatic  Eyes:  , EOM's intact,   Lungs:   Clear to auscultation bilaterally, respirations unlabored  Heart:  Regular rate and rhythm, S1, S2 normal  Abdomen:   Soft, non-tender, bowel sounds active all four quadrants,  no masses,   Extremities: Extremities normal, atraumatic, no  edema  Pulses: 2+ and symmetric    Lab Results: Recent Labs    07/26/24 1122 07/27/24 0524  NA 136 137  K 3.3* 4.6  CL 97* 98  CO2 28 27  GLUCOSE 309* 343*  BUN 13 11  CREATININE 0.78 0.74  CALCIUM  9.9 10.2   Recent Labs    07/27/24 0524 07/28/24 0418  AST 303* 193*  ALT 620* 526*  ALKPHOS 257* 253*  BILITOT 4.2* 3.4*  PROT 7.0 6.8  ALBUMIN  3.8 3.8   Recent Labs    07/27/24 0524 07/28/24 0418  WBC 6.5 10.4  HGB 10.9* 10.9*  HCT 32.3* 33.4*  MCV 88.5 90.5  PLT 334 353   No results for input(s): LABPROT, INR in the last 72 hours.    Assessment/Plan: -Abdominal pain with abnormal LFTs.  MRI MRCP showed finding concerning for CBD and PD stricture.  Underwent failed ERCP Dr. Rollin yesterday.  Dr. Rollin tried to attempt to achieve cannulation for 1 and 1/2  hours. - Biliary dilation.  Status post EUS with Dr. Rollin yesterday which showed dilation of CBD, CHD as well as PD without any obvious mass lesion. - Abnormal LFTs.  Likely from above. - Need for sign language interpreter  Recommendations -------------------------- - Patient is willing to have another ERCP today.  Her abdominal pain has resolved.  LFTs are improving.  Case discussed with Dr. Rosalie.  Tentative plan for another attempted ERCP today.  Risks (post ERCP pancreatitis, bleeding, infection, bowel perforation that could require surgery, sedation-related changes in cardiopulmonary systems), benefits (identification and possible treatment of source of symptoms, exclusion of certain causes of symptoms), and alternatives (watchful waiting, radiographic imaging studies, empiric medical treatment)  were explained to patient/family in detail and patient wishes to proceed.    [name] MD, FACP 07/28/2024, 11:00 AM  Contact #  [phone]

## 2024-07-28 NOTE — Plan of Care (Signed)
 FMTS Interim Progress Note  Called to speak with daughter [name], provided update on plan of care  Romelle Booty, MD 07/28/2024, 3:51 PM PGY-3, [facility] Family Medicine Service pager [phone]

## 2024-07-28 NOTE — Anesthesia Procedure Notes (Signed)
 Procedure Name: Intubation Date/Time: 07/28/2024 1:20 PM  Performed by: Lockie Flesher, CRNAPre-anesthesia Checklist: Patient identified, Emergency Drugs available, Suction available and Patient being monitored Patient Re-evaluated:Patient Re-evaluated prior to induction Oxygen Delivery Method: Circle System Utilized Preoxygenation: Pre-oxygenation with 100% oxygen Induction Type: IV induction Ventilation: Mask ventilation without difficulty Laryngoscope Size: Mac and 3 Grade View: Grade I Tube type: Oral Tube size: 7.0 mm Number of attempts: 1 Airway Equipment and Method: Stylet and Oral airway Placement Confirmation: ETT inserted through vocal cords under direct vision, positive ETCO2 and breath sounds checked- equal and bilateral Secured at: 22 cm Tube secured with: Tape Dental Injury: Teeth and Oropharynx as per pre-operative assessment

## 2024-07-28 NOTE — Assessment & Plan Note (Signed)
 HTN: home coreg , diltiazem , hydrochlorothiazide , valsartan , aspirin 

## 2024-07-28 NOTE — Inpatient Diabetes Management (Signed)
 Inpatient Diabetes Program Recommendations  AACE/ADA: New Consensus Statement on Inpatient Glycemic Control (2015)  Target Ranges:  Prepandial:   less than 140 mg/dL      Peak postprandial:   less than 180 mg/dL (1-2 hours)      Critically ill patients:  140 - 180 mg/dL   Lab Results  Component Value Date   GLUCAP 305 (H) 07/28/2024   HGBA1C 9.8 (A) 05/06/2024    Review of Glycemic Control  Diabetes history: DM2 Outpatient Diabetes medications: Lantus  4 BID, Humalog  6-8 TID, metformin  1000 mg BID, Ozempic  2 mg weekly Current orders for Inpatient glycemic control: Novolog  0-6 TID with meals  HgbA1C - 9.8 on 05/06/2024  Inpatient Diabetes Program Recommendations:    Add Lantus  3-4 units BID  Consider updating HgbA1C.  Will follow.  Thank you. Shona Brandy, RD, LDN, [facility] Inpatient Diabetes Coordinator [phone]

## 2024-07-29 ENCOUNTER — Encounter (HOSPITAL_COMMUNITY): Payer: Self-pay | Admitting: Gastroenterology

## 2024-07-29 ENCOUNTER — Encounter: Payer: Self-pay | Admitting: Pharmacist

## 2024-07-29 DIAGNOSIS — K819 Cholecystitis, unspecified: Secondary | ICD-10-CM | POA: Diagnosis not present

## 2024-07-29 LAB — COMPREHENSIVE METABOLIC PANEL WITH GFR
ALT: 426 U/L — ABNORMAL HIGH (ref 0–44)
ALT: 394 U/L — ABNORMAL HIGH (ref 0–44)
AST: 142 U/L — ABNORMAL HIGH (ref 15–41)
AST: 131 U/L — ABNORMAL HIGH (ref 15–41)
Albumin: 4.1 g/dL (ref 3.5–5.0)
Albumin: 4 g/dL (ref 3.5–5.0)
Alkaline Phosphatase: 253 U/L — ABNORMAL HIGH (ref 38–126)
Alkaline Phosphatase: 239 U/L — ABNORMAL HIGH (ref 38–126)
Anion gap: 27 — ABNORMAL HIGH (ref 5–15)
Anion gap: 16 — ABNORMAL HIGH (ref 5–15)
BUN: 22 mg/dL (ref 8–23)
BUN: 21 mg/dL (ref 8–23)
CO2: 14 mmol/L — ABNORMAL LOW (ref 22–32)
CO2: 23 mmol/L (ref 22–32)
Calcium: 10.3 mg/dL (ref 8.9–10.3)
Calcium: 10.3 mg/dL (ref 8.9–10.3)
Chloride: 94 mmol/L — ABNORMAL LOW (ref 98–111)
Chloride: 98 mmol/L (ref 98–111)
Creatinine, Ser: 1.2 mg/dL — ABNORMAL HIGH (ref 0.44–1.00)
Creatinine, Ser: 1.33 mg/dL — ABNORMAL HIGH (ref 0.44–1.00)
GFR, Estimated: 49 mL/min — ABNORMAL LOW
GFR, Estimated: 43 mL/min — ABNORMAL LOW
Glucose, Bld: 443 mg/dL — ABNORMAL HIGH (ref 70–99)
Glucose, Bld: 326 mg/dL — ABNORMAL HIGH (ref 70–99)
Potassium: 5 mmol/L (ref 3.5–5.1)
Potassium: 4 mmol/L (ref 3.5–5.1)
Sodium: 136 mmol/L (ref 135–145)
Sodium: 136 mmol/L (ref 135–145)
Total Bilirubin: 3.2 mg/dL — ABNORMAL HIGH (ref 0.0–1.2)
Total Bilirubin: 2.8 mg/dL — ABNORMAL HIGH (ref 0.0–1.2)
Total Protein: 6.9 g/dL (ref 6.5–8.1)
Total Protein: 6.8 g/dL (ref 6.5–8.1)

## 2024-07-29 LAB — GLUCOSE, CAPILLARY
Glucose-Capillary: 424 mg/dL — ABNORMAL HIGH (ref 70–99)
Glucose-Capillary: 400 mg/dL — ABNORMAL HIGH (ref 70–99)
Glucose-Capillary: 321 mg/dL — ABNORMAL HIGH (ref 70–99)
Glucose-Capillary: 267 mg/dL — ABNORMAL HIGH (ref 70–99)
Glucose-Capillary: 257 mg/dL — ABNORMAL HIGH (ref 70–99)
Glucose-Capillary: 260 mg/dL — ABNORMAL HIGH (ref 70–99)

## 2024-07-29 LAB — CBC
HCT: 31.7 % — ABNORMAL LOW (ref 36.0–46.0)
Hemoglobin: 10.4 g/dL — ABNORMAL LOW (ref 12.0–15.0)
MCH: 30.1 pg (ref 26.0–34.0)
MCHC: 32.8 g/dL (ref 30.0–36.0)
MCV: 91.6 fL (ref 80.0–100.0)
Platelets: 342 10*3/uL (ref 150–400)
RBC: 3.46 MIL/uL — ABNORMAL LOW (ref 3.87–5.11)
RDW: 16.7 % — ABNORMAL HIGH (ref 11.5–15.5)
WBC: 11.7 10*3/uL — ABNORMAL HIGH (ref 4.0–10.5)
nRBC: 0 % (ref 0.0–0.2)

## 2024-07-29 LAB — HEMOGLOBIN A1C
Hgb A1c MFr Bld: 8.4 % — ABNORMAL HIGH (ref 4.8–5.6)
Mean Plasma Glucose: 194.38 mg/dL

## 2024-07-29 MED ORDER — LACTATED RINGERS IV BOLUS
1000.0000 mL | Freq: Once | INTRAVENOUS | Status: AC
  Administered 2024-07-29: 1000 mL via INTRAVENOUS

## 2024-07-29 MED ORDER — INSULIN ASPART 100 UNIT/ML IJ SOLN
3.0000 [IU] | Freq: Once | INTRAMUSCULAR | Status: AC
  Administered 2024-07-29: 3 [IU] via SUBCUTANEOUS
  Filled 2024-07-29: qty 3

## 2024-07-29 MED ORDER — INSULIN ASPART 100 UNIT/ML IJ SOLN
0.0000 [IU] | Freq: Three times a day (TID) | INTRAMUSCULAR | Status: DC
  Administered 2024-07-29 – 2024-07-30 (×2): 5 [IU] via SUBCUTANEOUS
  Administered 2024-07-30: 7 [IU] via SUBCUTANEOUS
  Filled 2024-07-29: qty 7
  Filled 2024-07-29 (×2): qty 5

## 2024-07-29 MED ORDER — INSULIN GLARGINE 100 UNIT/ML ~~LOC~~ SOLN
5.0000 [IU] | Freq: Two times a day (BID) | SUBCUTANEOUS | Status: DC
  Administered 2024-07-29 – 2024-07-30 (×3): 5 [IU] via SUBCUTANEOUS
  Filled 2024-07-29 (×4): qty 0.05

## 2024-07-29 MED ORDER — ACETAMINOPHEN 325 MG PO TABS
650.0000 mg | ORAL_TABLET | ORAL | Status: DC | PRN
  Filled 2024-07-29: qty 2

## 2024-07-29 MED ORDER — ACETAMINOPHEN 500 MG PO TABS: 1000.0000 mg | ORAL_TABLET | ORAL | Status: DC | PRN

## 2024-07-29 NOTE — Assessment & Plan Note (Addendum)
 Difficult to control in the setting of daily procedures using glucagon  and dexamethasone .  Will continue to watch closely and spot dose as needed. - Increase to sensitive SSI - Holding home insulin , metformin , ozempic 

## 2024-07-29 NOTE — Assessment & Plan Note (Addendum)
 Gradual improvement in LFTs. Mild leukocytosis this AM most likely secondary to back to back procedures. No abdominal pain this AM.  - GI consulted, appreciate recommendations (s/o 01/27)  - repeat ERCP with CBD stent placement successful 01/26  - Advance diet as tolerated  - Monitor LFTs  - Follow-up with Dr. Rosalie in 6 to 8 weeks after discharge - Pain control:  - Tylenol  650mg  q4h prn  - Oxycodone  5mg  q6h prn

## 2024-07-29 NOTE — Plan of Care (Signed)

## 2024-07-29 NOTE — Inpatient Diabetes Management (Incomplete)
 Inpatient Diabetes Program Recommendations  AACE/ADA: New Consensus Statement on Inpatient Glycemic Control (2015)  Target Ranges:  Prepandial:   less than 140 mg/dL      Peak postprandial:   less than 180 mg/dL (1-2 hours)      Critically ill patients:  140 - 180 mg/dL   Lab Results  Component Value Date   GLUCAP 400 (H) 07/29/2024   HGBA1C 9.8 (A) 05/06/2024    Review of Glycemic Control  Diabetes history: DM2 Outpatient Diabetes medications: Lantus  4 BID, Humalog  6-8 units TID, metformin  1000 mg BID, Ozempic  2 mg weekly Current orders for Inpatient glycemic control: Lantus  5 units BID, Novolog  0-9 TID with meals  HgbA1C 8.4%, down from 9.8% on 05/06/2024   Inpatient Diabetes Program Recommendations:    May need meal coverage insulin  if pt eating > 50%. Consider changing diet to Carb-mod.  Add Novolog  2-3 units TID with meals if eating > 50%

## 2024-07-29 NOTE — Plan of Care (Signed)
 FMTS Interim Progress Note  S: Notified by nurse that patient is having some shaking of the hands and has dropped a glass of water.  Patient was seen and examined by myself at bedside.  A virtual ASL interpreter was used for the entire encounter.  Patient tells me she has noticed increased weakness since her blood sugars have been elevated in the hospital.  As she is my clinic patient, I am aware that she is very well-controlled on her diabetic regimen at home.  I even received a message from our clinical pharmacist today stating that she is so well-controlled on CGM readings and that she may decrease1 unit of insulin .  When talking to patient she is able to sign to the ASL interpreter without difficulty.  She is not confused.  She tells me she knows she gets very jittery when her sugars are uncontrolled and that is what she thinks this is.  She asked me why she is not on her home regimen.  Otherwise comfortable in hospital bed.  She did tolerate some lunch but does not appear to be drinking well.  O: BP 129/73 (BP Location: Left Arm)   Pulse 86   Temp 97.7 F (36.5 C)   Resp 18   Ht 5' 4 (1.626 m)   Wt 60.2 kg   SpO2 94%   BMI 22.78 kg/m   General: A&O, NAD HEENT: No sign of trauma, EOM grossly intact Respiratory: normal WOB GI: non-distended  Neuro: moves all four extremities appropriately, cranial nerves II through XII intact, sensation intact bilaterally, strength 5 out of 5 bilaterally, negative nose to finger test, negative cerebellar testing, answers all questions appropriately, follows all commands appropriately Psych: Appropriate mood and affect  A/P: Plan as per day progress note including the following: It appears the patient is not feeling 100% herself due to her uncontrolled blood sugars in the hospital after being so well-controlled at home.  Explained to patient why insulin  administration must be different in the hospital as well as the procedural medications that have most  likely impacted her glucose in such a short amount of time.  I do expect with fluids and oral intake the patient will begin to feel better as she has not eaten in about 2 days due to back-to-back procedures. - 1 L LR bolus now for worsening AKI and poor oral intake - Continue CBG 4 times daily before meals and at bedtime - Encourage oral intake - Contact FMTS with any concerns   Lupie Credit, DO 07/29/2024, 3:29 PM PGY-1, [facility] Medicine Service pager [phone]

## 2024-07-29 NOTE — Assessment & Plan Note (Addendum)
 HTN: home coreg , diltiazem , hydrochlorothiazide , valsartan , aspirin 

## 2024-07-29 NOTE — Progress Notes (Signed)
 [facility] Gastroenterology Progress Note  Alicia [name] 69 y.o. [birth year]  CC: Biliary stricture   Subjective: Patient seen and bedside.  Sign language interpreter machine was used.  Patient denies any abdominal pain.  ROS : Afebrile, negative for chest pain   Objective: Vital signs in last 24 hours: Vitals:   07/29/24 0413 07/29/24 0745  BP: 124/68 120/64  Pulse: 93 88  Resp: 16   Temp: 98.7 F (37.1 C) 97.8 F (36.6 C)  SpO2: 98% 98%    Physical Exam: Resting comfortably, not in acute distress.  Abdominal exam benign.  Abdomen is soft, nontender, nondistended, bowel sound present, no peritoneal signs   Lab Results: Recent Labs    07/27/24 0524 07/29/24 0602  NA 137 136  K 4.6 5.0  CL 98 94*  CO2 27 14*  GLUCOSE 343* 443*  BUN 11 22  CREATININE 0.74 1.20*  CALCIUM  10.2 10.3   Recent Labs    07/28/24 0418 07/29/24 0602  AST 193* 142*  ALT 526* 426*  ALKPHOS 253* 253*  BILITOT 3.4* 3.2*  PROT 6.8 6.9  ALBUMIN  3.8 4.1   Recent Labs    07/28/24 0418 07/29/24 0602  WBC 10.4 11.7*  HGB 10.9* 10.4*  HCT 33.4* 31.7*  MCV 90.5 91.6  PLT 353 342   No results for input(s): LABPROT, INR in the last 72 hours.    Assessment/Plan: -Abdominal pain with abnormal LFTs.  MRI MRCP showed finding concerning for CBD and PD stricture.  Underwent failed ERCP Dr. Rollin yesterday.  Dr. Rollin tried to attempt to achieve cannulation for 1 and 1/2 hours. - Biliary dilation.  Status post EUS with Dr. Rollin yesterday which showed dilation of CBD, CHD as well as PD without any obvious mass lesion. - Abnormal LFTs.  Likely from above. - Need for sign language interpreter   Recommendations -------------------------- - Underwent successful ERCP yesterday with 10 French by 7 cm plastic CBD stent placement by Dr. Rosalie.  Brushings of CBD was performed. -Recommend advance diet as tolerated -Monitor LFTs -Follow-up with Dr. Rosalie in 6 to 8 weeks after discharge -GI will sign  off.  Call us  back if needed.     [name] MD, FACP 07/29/2024, 10:43 AM  Contact #  [phone]

## 2024-07-29 NOTE — Progress Notes (Signed)
" ° ° ° °  Daily Progress Note Intern Pager: [phone]  Patient name: Alicia [name] Medical record number: [id number] Date of birth: August 21, 1955 Age: 69 y.o. Gender: female  Primary Care Provider: Lupie Credit, DO Consultants: GI, surgery (s/o) Code Status: Full Code  Pt Overview and Major Events to Date:  01/24: Admitted for concern for acalculous cholecystitis, MRCP 01/25: ERCP 01/26: Repeat ERCP and CBD stent placement  Assessment and Plan:  Alicia [name] is a 69 year old female with past medical history of 2 diabetes, hypertension, GERD, HLD, asthma, diabetic neuropathy, bilateral deafness admitted for acalculous cholecystitis with distal biliary and pancreatic duct strictures and possible chronic pancreatitis. Assessment & Plan Acalculous cholecystitis Abnormal MRCP / Concern for biliary and pancreatic stricture / possible chronic pancreatitis Elevated LFTs Abdominal pain (Resolved: 07/29/2024) Gradual improvement in LFTs. Mild leukocytosis this AM most likely secondary to back to back procedures. No abdominal pain this AM.  - GI consulted, appreciate recommendations (s/o 01/27)  - repeat ERCP with CBD stent placement successful 01/26  - Advance diet as tolerated  - Monitor LFTs  - Follow-up with Dr. Rosalie in 6 to 8 weeks after discharge - Pain control:  - Tylenol  650mg  q4h prn  - Oxycodone  5mg  q6h prn T2DM (type 2 diabetes mellitus) (HCC) Difficult to control in the setting of daily procedures using glucagon  and dexamethasone .  Will continue to watch closely and spot dose as needed. - Increase to sensitive SSI - Holding home insulin , metformin , ozempic  Chronic health problem HTN: home coreg , diltiazem , hydrochlorothiazide , valsartan , aspirin   FEN/GI: Soft diet, advance as tolerated PPx: Heparin  Dispo:Home pending clinical improvement . Barriers include uncontrolled blood blood sugars.   Subjective:  Patient was seen and examined at bedside by myself and Dr. Madelon. in  person ASL interpreter Oneil was used for the entire encounter.  Patient is sleeping soundly but easy to wake.  She reports no abdominal pain today.  No other complaints at this time.  Objective: Temp:  [97.8 F (36.6 C)-98.7 F (37.1 C)] 97.8 F (36.6 C) (01/27 0745) Pulse Rate:  [86-97] 88 (01/27 0745) Resp:  [12-22] 16 (01/27 0413) BP: (95-124)/(48-69) 120/64 (01/27 0745) SpO2:  [93 %-100 %] 98 % (01/27 0745) Weight:  [60.2 kg] 60.2 kg (01/26 1309) Physical Exam: General: Easy to awake, lying supine comfortably in hospital bed, in no acute distress Cardiovascular: RRR, no M/R/G Respiratory: CTAB, normal work of breathing on room air Abdomen: Soft, nontender, nondistended, hypoactive bowel sounds Extremities: Moves all extremities equally, no peripheral edema  Laboratory: Most recent CBC Lab Results  Component Value Date   WBC 11.7 (H) 07/29/2024   HGB 10.4 (L) 07/29/2024   HCT 31.7 (L) 07/29/2024   MCV 91.6 07/29/2024   PLT 342 07/29/2024   Most recent BMP    Latest Ref Rng & Units 07/29/2024    6:02 AM  BMP  Glucose 70 - 99 mg/dL 556   BUN 8 - 23 mg/dL 22   Creatinine 9.55 - 1.00 mg/dL 8.79   Sodium 864 - 854 mmol/L 136   Potassium 3.5 - 5.1 mmol/L 5.0   Chloride 98 - 111 mmol/L 94   CO2 22 - 32 mmol/L 14   Calcium  8.9 - 10.3 mg/dL 89.6    Lupie Credit, DO 07/29/2024, 8:05 AM  PGY-1, [facility] FPTS Intern pager: [phone], text pages welcome Secure chat group [facility] Teaching Service   "

## 2024-07-30 ENCOUNTER — Telehealth (HOSPITAL_COMMUNITY): Payer: Self-pay

## 2024-07-30 ENCOUNTER — Other Ambulatory Visit (HOSPITAL_COMMUNITY): Payer: Self-pay

## 2024-07-30 DIAGNOSIS — K819 Cholecystitis, unspecified: Secondary | ICD-10-CM | POA: Diagnosis not present

## 2024-07-30 LAB — COMPREHENSIVE METABOLIC PANEL WITH GFR
ALT: 339 U/L — ABNORMAL HIGH (ref 0–44)
AST: 91 U/L — ABNORMAL HIGH (ref 15–41)
Albumin: 3.9 g/dL (ref 3.5–5.0)
Alkaline Phosphatase: 246 U/L — ABNORMAL HIGH (ref 38–126)
Anion gap: 18 — ABNORMAL HIGH (ref 5–15)
BUN: 14 mg/dL (ref 8–23)
CO2: 22 mmol/L (ref 22–32)
Calcium: 10.3 mg/dL (ref 8.9–10.3)
Chloride: 94 mmol/L — ABNORMAL LOW (ref 98–111)
Creatinine, Ser: 0.92 mg/dL (ref 0.44–1.00)
GFR, Estimated: >60 mL/min
Glucose, Bld: 321 mg/dL — ABNORMAL HIGH (ref 70–99)
Potassium: 3.6 mmol/L (ref 3.5–5.1)
Sodium: 134 mmol/L — ABNORMAL LOW (ref 135–145)
Total Bilirubin: 2.2 mg/dL — ABNORMAL HIGH (ref 0.0–1.2)
Total Protein: 6.8 g/dL (ref 6.5–8.1)

## 2024-07-30 LAB — GLUCOSE, CAPILLARY
Glucose-Capillary: 311 mg/dL — ABNORMAL HIGH (ref 70–99)
Glucose-Capillary: 295 mg/dL — ABNORMAL HIGH (ref 70–99)

## 2024-07-30 MED ORDER — INSULIN LISPRO 100 UNIT/ML IJ SOLN: INTRAMUSCULAR | 11 refills | Status: AC

## 2024-07-30 NOTE — Discharge Summary (Addendum)
 "  Family Medicine Teaching [facility] Discharge Summary  Patient name: Alicia [name] Medical record number: [id number] Date of birth: 17-Aug-1955 Age: 69 y.o. Gender: female Date of Admission: 07/25/2024  Date of Discharge: 07/30/2024 Admitting Physician: Donald CHRISTELLA Lai, DO  Primary Care Provider: Lupie Credit, DO Consultants: GI, general surgery  Indication for Hospitalization: concern for acalculous cholecystitis on outpatient RUQ US  and elevated LFTs  Brief Hospital Course:  Alicia [name] is a 69 y.o.female with a history of asthma, deafness, diabetic neuropathy, gasgtroparesis, GERD, HLD, HTN who was admitted to the [facility] Medicine Teaching Service at [facility] for abdominal pain and concern for acalculous cholecystitis. Her hospital course is detailed below:  Abdominal pain Acalculous cholecystitis Patient presented with 2-week history of abdominal pain and elevated LFTs.  Found to have evidence of acute cholecystitis with dilated common bile duct but no stone on outpatient RUQ ultrasound.  MRCP was performed and showed concern for biliary stricture and pancreatic duct stricture and possible chronic pancreatitis. GI was consulted and initial ERCP failed.  A repeat ERCP was performed the following day which was successful with placement of temporary plastic biliary stent.  The following day the patient stated her abdominal pain had resolved. Due to decreased oral intake, the patient had a post-procedural AKI with a creatinine up to 1.33. This was treated with 1L bolus of LR and encouragement of oral hydration. LFTs continued to trend down during hospitalization and the patient was discharged after tolerating PO without N/V or abdominal pain.  Other chronic conditions were medically managed with home medications and formulary alternatives as necessary (T2DM)  PCP Follow-up Recommendations: Call [facility] on Monday 2/2 for update on pathology results Follow up with GI Dr. Rosalie in 6-8  weeks after discharge Recheck CMP/hepatic panel on follow up with PCP Follow up with Dr. Amalia diabetes  Risk benefit discussion with Ozempic , consider SGLT2  Discharge Diagnoses/Problem List:  [facility] Problems      Hospital     * (Principal) Acalculous cholecystitis     Gallstone     Elevated LFTs     T2DM (type 2 diabetes mellitus) (HCC)     Common bile duct dilatation     Abnormal MRCP / Concern for biliary and pancreatic stricture / possible  chronic pancreatitis     Chronic health problem   Disposition: Home  Discharge Condition: Stable  Discharge Exam:  General: A&O, NAD, lying comfortably in hospital bed HEENT: No sign of trauma, EOM grossly intact, MMM Cardiac: RRR, no m/r/g Respiratory: CTAB, normal WOB, no w/c/r GI: Soft, mild epigastric tenderness to palpation, non-distended, hypoactive bowel sounds Extremities: NTTP, no peripheral edema Neuro: moves all four extremities appropriately. Psych: Appropriate mood and affect  Significant Procedures: ERCP x2, upper endoscopic ultrasound  Significant Labs and Imaging:  Recent Labs  Lab 07/29/24 0602  WBC 11.7*  HGB 10.4*  HCT 31.7*  PLT 342   Recent Labs  Lab 07/29/24 0602 07/29/24 1352 07/30/24 0857  NA 136 136 134*  K 5.0 4.0 3.6  CL 94* 98 94*  CO2 14* 23 22  GLUCOSE 443* 326* 321*  BUN 22 21 14   CREATININE 1.20* 1.33* 0.92  CALCIUM  10.3 10.3 10.3  ALKPHOS 253* 239* 246*  AST 142* 131* 91*  ALT 426* 394* 339*  ALBUMIN  4.1 4.0 3.9   DG ERCP 07/28/2024 IMPRESSION: Fluoroscopic imaging for ERCP and plastic biliary stent placement - The major papilla appeared normal. - A precut biliary sphincterotomy was performed. - A  biliary sphincterotomy extension was performed after selective cannulation - Cells for cytology obtained in the lower third of the main duct. - One temporary plastic biliary stent was placed into the common bile duct.  DG ERCP 07/27/2024 (failed) IMPRESSION: Fluoroscopic imaging  for ERCP For complete description of intra procedural findings, please see performing service dictation.  MR ABDOMEN MRCP W WO CONTRAST  07/26/2024 IMPRESSION: 1. There is moderate dilation of the extrahepatic bile duct with fluid-fluid level, which may be due to concentrated bile versus sludge. There is an approximately 1.2 cm long segment of obliterated distal extrahepatic bile duct. There is also an approximately 2.5-3 cm long segment of obliterated main pancreatic duct in the pancreatic head region. There is marked atrophy of the pancreas in the body and tail region with markedly dilated and tortuous main pancreatic duct with ectatic side branches. There is preserved pancreatic tissue in the head/uncinate process region; however, no discrete focal hypoenhancing mass seen. Findings favor stricture, which may be malignant versus benign. Correlation with tumor markers, ERCP/EUS and tissue sampling is recommended. 2. There is mild bilateral hydronephrosis with nondilated upper ureters. Findings favor component of UPJ obstruction. 3. Multiple subcentimeter sized filling defects in the left renal collecting system/renal pelvis and right kidney lower pole calyx, favored to represent calculi. 4. No metastatic disease identified within the abdomen.  US  Abdomen Limited RUQ  07/24/2024 IMPRESSION: 1. Gallbladder is distended with positive sonographic Murphy sign. No definitive cholelithiasis but likely layering sludge. Findings are concerning for acute cholecystitis. 2. Common bile duct is dilated measuring up to 14 mm. There are low-level echoes within the common bile duct which could reflect biliary sludge. Consider further evaluation with dedicated cross-sectional imaging such as MRI with MRCP  Discharge Medications:  Allergies as of 07/30/2024       Reactions   Sulfonamide Derivatives Swelling, Rash   Lost BABY - Terrible itching.    Lipitor [atorvastatin Calcium ] Other (See  Comments)   Muscle Aches - Mild-Moderate - completely resolved with D/C of atorva.    Ramipril Cough      Trulicity  [dulaglutide ] Nausea And Vomiting        Medication List     PAUSE taking these medications    Ozempic  (2 MG/DOSE) 8 MG/3ML Sopn Wait to take this until your doctor or other care provider tells you to start again. Generic drug: Semaglutide  (2 MG/DOSE) Inject 2 mg into the skin once a week. What changed: when to take this       STOP taking these medications    aspirin  81 MG chewable tablet       TAKE these medications    acetaminophen  500 MG tablet Commonly known as: TYLENOL  Take 1,000 mg by mouth every 6 (six) hours as needed for mild pain (pain score 1-3).   carvedilol  25 MG tablet Commonly known as: COREG  TAKE 1 TABLET BY MOUTH TWICE A DAY.   cholecalciferol  1000 units tablet Commonly known as: VITAMIN D  Take 1,000 Units by mouth daily.   diltiazem  240 MG 24 hr capsule Commonly known as: CARDIZEM  CD Take 1 capsule (240 mg total) by mouth daily.   diltiazem  30 MG tablet Commonly known as: CARDIZEM  Take 30 mg by mouth as needed (Palpitations). TAKE 1 TABLET BY MOUTH AS NEEDED (HEART RACING OR PALPITATIONS THAT LAST LONGER THAN 15 MINS   DULoxetine  60 MG capsule Commonly known as: CYMBALTA  Take 1 capsule (60 mg total) by mouth daily.   famotidine  20 MG tablet Commonly known as:  PEPCID  Take 1 tablet (20 mg total) by mouth 2 (two) times daily.   fluticasone  50 MCG/ACT nasal spray Commonly known as: FLONASE  2 puffs each nostril daily What changed:  how much to take how to take this when to take this reasons to take this   gabapentin  800 MG tablet Commonly known as: NEURONTIN  TAKE 1 TABLET BY MOUTH TWICE A DAY   hydrochlorothiazide  12.5 MG capsule Commonly known as: MICROZIDE  Take 1 capsule (12.5 mg total) by mouth daily.   insulin  lispro 100 UNIT/ML injection Commonly known as: HumaLOG  INJECT 2-4 UNITS INTO THE SKIN 3 (THREE)  TIMES DAILY WITH MEALS What changed:  how much to take how to take this when to take this additional instructions   Lantus  SoloStar 100 UNIT/ML Solostar Pen Generic drug: insulin  glargine Inject 4 Units into the skin 2 (two) times daily. What changed: how much to take   metFORMIN  1000 MG tablet Commonly known as: GLUCOPHAGE  Take 1 tablet (1,000 mg total) by mouth 2 (two) times daily.   Multivitamin Gummies Womens Chew Chew 2 each by mouth daily.   Nervive Roll-On 4-1 % Liqd Generic drug: Lidocaine -Menthol  Apply 1 Application topically as needed (foot (nerve) pain).   pantoprazole  40 MG tablet Commonly known as: PROTONIX  Take 1 tablet (40 mg total) by mouth daily.   Pataday  0.1 % ophthalmic solution Generic drug: olopatadine  Place 2 drops into both eyes every morning.   Pre-Moistened Witch Hazel 50 % Pads Apply 1 each topically as needed (hemorrhoids).   rosuvastatin  5 MG tablet Commonly known as: CRESTOR  TAKE 1 TABLET (5 MG TOTAL) BY MOUTH 2 TIMES A WEEK   valsartan  160 MG tablet Commonly known as: DIOVAN  Take 1 tablet (160 mg total) by mouth daily. What changed: when to take this   Vitamin B 12 500 MCG Tabs Take 500 mcg by mouth daily at 6 (six) AM.       Discharge Instructions: Please refer to Patient Instructions section of EMR for full details.  Patient was counseled important signs and symptoms that should prompt return to medical care, changes in medications, dietary instructions, activity restrictions, and follow up appointments.   Follow-Up Appointments:  Follow-up Information     Rosalie Kitchens, MD. Schedule an appointment as soon as possible for a visit in 6 week(s).   Specialty: Gastroenterology Why: Follow-up for biliary stricture and CBD stent Contact information: [address] [place] [address] [phone]                Lupie Credit, DO 07/30/2024, 11:35 AM PGY-1, [facility]   FPTS Upper-Level  Resident Addendum   I have discussed the above with Dr. Lupie and agree with the documented plan. My edits for correction/addition/clarification are included above. Please see any attending notes.   Payton Coward, MD PGY-3, [facility] 07/30/2024 12:57 PM  FPTS Service pager: [phone] (text pages welcome through AMION)  "

## 2024-07-30 NOTE — Care Management Important Message (Signed)
 Important Message  Patient Details  Name: Alicia [name] MRN: [id number] Date of Birth: 02/19/1956   Important Message Given:  Yes - Medicare IM   Patient left prior to IM delivery will mail a copy to the patients home address.   Nathalee Smarr 07/30/2024, 3:22 PM

## 2024-07-30 NOTE — Inpatient Diabetes Management (Signed)
 Inpatient Diabetes Program Recommendations  AACE/ADA: New Consensus Statement on Inpatient Glycemic Control (2015)  Target Ranges:  Prepandial:   less than 140 mg/dL      Peak postprandial:   less than 180 mg/dL (1-2 hours)      Critically ill patients:  140 - 180 mg/dL   Lab Results  Component Value Date   GLUCAP 311 (H) 07/30/2024   HGBA1C 8.4 (H) 07/29/2024    Latest Reference Range & Units 07/29/24 08:26 07/29/24 11:05 07/29/24 14:38 07/29/24 16:20 07/29/24 20:34 07/30/24 08:25  Glucose-Capillary 70 - 99 mg/dL 599 (H) 678 (H) 732 (H) 257 (H) 260 (H) 311 (H)   Review of Glycemic Control  Diabetes history: DM2  Outpatient Diabetes medications:  Lantus  4 BID Humalog  6-8 TID Metformin  1000 mg BID Ozempic  2 mg weekly  Current orders for Inpatient glycemic control:  Lantus  5 units BID  Novolog  0-9 TID with meals  Inpatient Diabetes Program Recommendations:  Noted A1C has improved from 9.8% on 05/06/24 to 8.4%.   Please consider:  - Increase Lantus  to 7 units BID  - Add at bedtime correction, Novolog  0-5 units at bedtime   Thanks,  Lavanda Search, RN, MSN, [facility]  Inpatient Diabetes Coordinator  Pager [phone] (8a-5p)

## 2024-07-30 NOTE — Plan of Care (Signed)
 " Problem: Education: Goal: Ability to describe self-care measures that may prevent or decrease complications (Diabetes Survival Skills Education) will improve 07/30/2024 1259 by Sebastian Daina LABOR, RN Outcome: Adequate for Discharge 07/30/2024 1259 by Sebastian Daina LABOR, RN Outcome: Adequate for Discharge Goal: Individualized Educational Video(s) 07/30/2024 1259 by Sebastian Daina LABOR, RN Outcome: Adequate for Discharge 07/30/2024 1259 by Sebastian Daina LABOR, RN Outcome: Adequate for Discharge   Problem: Coping: Goal: Ability to adjust to condition or change in health will improve 07/30/2024 1259 by Sebastian Daina LABOR, RN Outcome: Adequate for Discharge 07/30/2024 1259 by Sebastian Daina LABOR, RN Outcome: Adequate for Discharge   Problem: Fluid Volume: Goal: Ability to maintain a balanced intake and output will improve 07/30/2024 1259 by Sebastian Daina LABOR, RN Outcome: Adequate for Discharge 07/30/2024 1259 by Sebastian Daina LABOR, RN Outcome: Adequate for Discharge   Problem: Health Behavior/Discharge Planning: Goal: Ability to identify and utilize available resources and services will improve 07/30/2024 1259 by Sebastian Daina LABOR, RN Outcome: Adequate for Discharge 07/30/2024 1259 by Sebastian Daina LABOR, RN Outcome: Adequate for Discharge Goal: Ability to manage health-related needs will improve 07/30/2024 1259 by Sebastian Daina LABOR, RN Outcome: Adequate for Discharge 07/30/2024 1259 by Sebastian Daina LABOR, RN Outcome: Adequate for Discharge   Problem: Metabolic: Goal: Ability to maintain appropriate glucose levels will improve 07/30/2024 1259 by Sebastian Daina LABOR, RN Outcome: Adequate for Discharge 07/30/2024 1259 by Sebastian Daina LABOR, RN Outcome: Adequate for Discharge   Problem: Nutritional: Goal: Maintenance of adequate nutrition will improve 07/30/2024 1259 by Sebastian Daina LABOR, RN Outcome: Adequate for Discharge 07/30/2024 1259 by Sebastian Daina LABOR, RN Outcome: Adequate for  Discharge Goal: Progress toward achieving an optimal weight will improve 07/30/2024 1259 by Sebastian Daina LABOR, RN Outcome: Adequate for Discharge 07/30/2024 1259 by Sebastian Daina LABOR, RN Outcome: Adequate for Discharge   Problem: Skin Integrity: Goal: Risk for impaired skin integrity will decrease 07/30/2024 1259 by Sebastian Daina LABOR, RN Outcome: Adequate for Discharge 07/30/2024 1259 by Sebastian Daina LABOR, RN Outcome: Adequate for Discharge   Problem: Tissue Perfusion: Goal: Adequacy of tissue perfusion will improve 07/30/2024 1259 by Sebastian Daina LABOR, RN Outcome: Adequate for Discharge 07/30/2024 1259 by Sebastian Daina LABOR, RN Outcome: Adequate for Discharge   Problem: Education: Goal: Knowledge of General Education information will improve Description: Including pain rating scale, medication(s)/side effects and non-pharmacologic comfort measures 07/30/2024 1259 by Sebastian Daina LABOR, RN Outcome: Adequate for Discharge 07/30/2024 1259 by Sebastian Daina LABOR, RN Outcome: Adequate for Discharge   Problem: Health Behavior/Discharge Planning: Goal: Ability to manage health-related needs will improve 07/30/2024 1259 by Sebastian Daina LABOR, RN Outcome: Adequate for Discharge 07/30/2024 1259 by Sebastian Daina LABOR, RN Outcome: Adequate for Discharge   Problem: Clinical Measurements: Goal: Ability to maintain clinical measurements within normal limits will improve 07/30/2024 1259 by Sebastian Daina LABOR, RN Outcome: Adequate for Discharge 07/30/2024 1259 by Sebastian Daina LABOR, RN Outcome: Adequate for Discharge Goal: Will remain free from infection 07/30/2024 1259 by Sebastian Daina LABOR, RN Outcome: Adequate for Discharge 07/30/2024 1259 by Sebastian Daina LABOR, RN Outcome: Adequate for Discharge Goal: Diagnostic test results will improve 07/30/2024 1259 by Sebastian Daina LABOR, RN Outcome: Adequate for Discharge 07/30/2024 1259 by Sebastian Daina LABOR, RN Outcome: Adequate for Discharge Goal:  Respiratory complications will improve 07/30/2024 1259 by Sebastian Daina LABOR, RN Outcome: Adequate for Discharge 07/30/2024 1259 by Sebastian Daina LABOR, RN Outcome: Adequate for Discharge Goal: Cardiovascular complication will be avoided 07/30/2024 1259 by Sebastian Daina LABOR, RN  Outcome: Adequate for Discharge 07/30/2024 1259 by Sebastian Daina LABOR, RN Outcome: Adequate for Discharge   Problem: Activity: Goal: Risk for activity intolerance will decrease 07/30/2024 1259 by Sebastian Daina LABOR, RN Outcome: Adequate for Discharge 07/30/2024 1259 by Sebastian Daina LABOR, RN Outcome: Adequate for Discharge   Problem: Nutrition: Goal: Adequate nutrition will be maintained 07/30/2024 1259 by Sebastian Daina LABOR, RN Outcome: Adequate for Discharge 07/30/2024 1259 by Sebastian Daina LABOR, RN Outcome: Adequate for Discharge   Problem: Coping: Goal: Level of anxiety will decrease 07/30/2024 1259 by Sebastian Daina LABOR, RN Outcome: Adequate for Discharge 07/30/2024 1259 by Sebastian Daina LABOR, RN Outcome: Adequate for Discharge   Problem: Elimination: Goal: Will not experience complications related to bowel motility 07/30/2024 1259 by Sebastian Daina LABOR, RN Outcome: Adequate for Discharge 07/30/2024 1259 by Sebastian Daina LABOR, RN Outcome: Adequate for Discharge Goal: Will not experience complications related to urinary retention 07/30/2024 1259 by Sebastian Daina LABOR, RN Outcome: Adequate for Discharge 07/30/2024 1259 by Sebastian Daina LABOR, RN Outcome: Adequate for Discharge   Problem: Pain Managment: Goal: General experience of comfort will improve and/or be controlled 07/30/2024 1259 by Sebastian Daina LABOR, RN Outcome: Adequate for Discharge 07/30/2024 1259 by Sebastian Daina LABOR, RN Outcome: Adequate for Discharge   Problem: Safety: Goal: Ability to remain free from injury will improve 07/30/2024 1259 by Sebastian Daina LABOR, RN Outcome: Adequate for Discharge 07/30/2024 1259 by Sebastian Daina LABOR,  RN Outcome: Adequate for Discharge   Problem: Skin Integrity: Goal: Risk for impaired skin integrity will decrease 07/30/2024 1259 by Sebastian Daina LABOR, RN Outcome: Adequate for Discharge 07/30/2024 1259 by Sebastian Daina LABOR, RN Outcome: Adequate for Discharge   "

## 2024-07-30 NOTE — Progress Notes (Signed)
 Discharge  ASL video interpreter Gussie [phone]  Patient expressed understanding of discharge POC.   Patient given time to ask any questions. Daughter at bedside and also given time to ask questions.  PIV removed. Pressure dressings intact.  All personal belongings at bedtime.   Insulin  coverage given by primary RN for elevated blood glucose level.  Prior to discharge.  Patient denies any symptoms of hyperglycemia.  No s/sx of hyperglycemia noted.   Discharged to [facility]

## 2024-07-30 NOTE — Telephone Encounter (Signed)
 Pharmacy Patient Advocate Encounter  Insurance verification completed.    The patient is insured through [employer]. Patient has Medicare and is not eligible for a copay card, but may be able to apply for patient assistance or Medicare RX Payment Plan (Patient Must reach out to their plan, if eligible for payment plan), if available.    Ran test claim for Farxiga 10mg  tablet and the current 30 day co-pay is $12.65.  Ran test claim for Jardiance 10mg  tablet and the current 30 day co-pay is $12.65.  This test claim was processed through [facility]- copay amounts may vary at other pharmacies due to pharmacy/plan contracts, or as the patient moves through the different stages of their insurance plan.

## 2024-07-30 NOTE — Plan of Care (Signed)

## 2024-07-31 ENCOUNTER — Telehealth: Payer: Self-pay

## 2024-07-31 NOTE — Transitions of Care (Post Inpatient/ED Visit) (Signed)
" ° °  07/31/2024  Name: SHAWNEEQUA BALDRIDGE MRN: [id number] DOB: [birth year]  Today's TOC FU Call Status: Today's TOC FU Call Status:: Unsuccessful Call (1st Attempt) Unsuccessful Call (1st Attempt) Date: 07/31/24  Attempted to reach the patient regarding the most recent Inpatient/ED visit.  ASL interpreter answers call and attempted to connect with patient but went to voice mail. RN CM requested message to be left identifying RN CM and call back number.   Follow Up Plan: Additional outreach attempts will be made to reach the patient to complete the Transitions of Care (Post Inpatient/ED visit) call.    Bing Edison MSN, RN RN [facility] [employer] [facility]  [facility] Office Hours M-F [phone] Direct Dial : [phone] Main Phone [phone]  Fax: [phone] [email or web address]    "

## 2024-08-01 ENCOUNTER — Other Ambulatory Visit: Payer: Self-pay | Admitting: Cardiology

## 2024-08-01 ENCOUNTER — Other Ambulatory Visit: Payer: Self-pay

## 2024-08-01 ENCOUNTER — Emergency Department (HOSPITAL_COMMUNITY)
Admission: EM | Admit: 2024-08-01 | Discharge: 2024-08-02 | Disposition: A | Payer: Medicare (Managed Care) | Attending: Emergency Medicine | Admitting: Emergency Medicine

## 2024-08-01 ENCOUNTER — Ambulatory Visit: Payer: Self-pay

## 2024-08-01 VITALS — BP 145/69 | HR 77 | Wt 115.8 lb

## 2024-08-01 DIAGNOSIS — Z79899 Other long term (current) drug therapy: Secondary | ICD-10-CM | POA: Diagnosis not present

## 2024-08-01 DIAGNOSIS — H5712 Ocular pain, left eye: Secondary | ICD-10-CM | POA: Diagnosis not present

## 2024-08-01 DIAGNOSIS — R531 Weakness: Secondary | ICD-10-CM | POA: Diagnosis present

## 2024-08-01 DIAGNOSIS — R8281 Pyuria: Secondary | ICD-10-CM | POA: Insufficient documentation

## 2024-08-01 DIAGNOSIS — E1165 Type 2 diabetes mellitus with hyperglycemia: Secondary | ICD-10-CM | POA: Diagnosis not present

## 2024-08-01 DIAGNOSIS — Z7984 Long term (current) use of oral hypoglycemic drugs: Secondary | ICD-10-CM | POA: Diagnosis not present

## 2024-08-01 DIAGNOSIS — K819 Cholecystitis, unspecified: Secondary | ICD-10-CM | POA: Diagnosis not present

## 2024-08-01 DIAGNOSIS — E876 Hypokalemia: Secondary | ICD-10-CM | POA: Diagnosis not present

## 2024-08-01 DIAGNOSIS — W19XXXA Unspecified fall, initial encounter: Secondary | ICD-10-CM

## 2024-08-01 DIAGNOSIS — Z09 Encounter for follow-up examination after completed treatment for conditions other than malignant neoplasm: Secondary | ICD-10-CM

## 2024-08-01 DIAGNOSIS — I10 Essential (primary) hypertension: Secondary | ICD-10-CM | POA: Diagnosis not present

## 2024-08-01 DIAGNOSIS — D649 Anemia, unspecified: Secondary | ICD-10-CM | POA: Insufficient documentation

## 2024-08-01 DIAGNOSIS — Z794 Long term (current) use of insulin: Secondary | ICD-10-CM | POA: Diagnosis not present

## 2024-08-01 LAB — CBC WITH DIFFERENTIAL/PLATELET
Abs Immature Granulocytes: 0.02 10*3/uL (ref 0.00–0.07)
Basophils Absolute: 0 10*3/uL (ref 0.0–0.1)
Basophils Relative: 0 %
Eosinophils Absolute: 0.1 10*3/uL (ref 0.0–0.5)
Eosinophils Relative: 2 %
HCT: 33.7 % — ABNORMAL LOW (ref 36.0–46.0)
Hemoglobin: 11.4 g/dL — ABNORMAL LOW (ref 12.0–15.0)
Immature Granulocytes: 0 %
Lymphocytes Relative: 31 %
Lymphs Abs: 2.4 10*3/uL (ref 0.7–4.0)
MCH: 29.8 pg (ref 26.0–34.0)
MCHC: 33.8 g/dL (ref 30.0–36.0)
MCV: 88.2 fL (ref 80.0–100.0)
Monocytes Absolute: 1 10*3/uL (ref 0.1–1.0)
Monocytes Relative: 13 %
Neutro Abs: 4.2 10*3/uL (ref 1.7–7.7)
Neutrophils Relative %: 54 %
Platelets: 324 10*3/uL (ref 150–400)
RBC: 3.82 MIL/uL — ABNORMAL LOW (ref 3.87–5.11)
RDW: 16.3 % — ABNORMAL HIGH (ref 11.5–15.5)
WBC: 7.7 10*3/uL (ref 4.0–10.5)
nRBC: 0 % (ref 0.0–0.2)

## 2024-08-01 LAB — BASIC METABOLIC PANEL WITH GFR
Anion gap: 14 (ref 5–15)
BUN: 15 mg/dL (ref 8–23)
CO2: 26 mmol/L (ref 22–32)
Calcium: 10.6 mg/dL — ABNORMAL HIGH (ref 8.9–10.3)
Chloride: 95 mmol/L — ABNORMAL LOW (ref 98–111)
Creatinine, Ser: 0.74 mg/dL (ref 0.44–1.00)
GFR, Estimated: >60 mL/min
Glucose, Bld: 293 mg/dL — ABNORMAL HIGH (ref 70–99)
Potassium: 3.2 mmol/L — ABNORMAL LOW (ref 3.5–5.1)
Sodium: 135 mmol/L (ref 135–145)

## 2024-08-01 LAB — CYTOLOGY - NON PAP

## 2024-08-01 NOTE — Patient Instructions (Signed)
 It was wonderful to see you today.  Please bring ALL of your medications with you to every visit.   Today we talked about:  ***  Thank you for choosing [facility].   Please call [phone] with any questions about today's appointment.  Please arrive at least 15 minutes prior to your scheduled appointments.   If you had blood work today, I will send you a MyChart message or a letter if results are normal. Otherwise, I will give you a call.   If you had a referral placed, they will call you to set up an appointment. Please give us  a call if you don't hear back in the next 2 weeks.   If you need additional refills before your next appointment, please call your pharmacy first.   You should follow up in our clinic in No follow-ups on file.  Camie Dixons, DO Family Medicine

## 2024-08-01 NOTE — Progress Notes (Signed)
" ° ° ° °  SUBJECTIVE:   CHIEF COMPLAINT / HPI:   Alicia [name] presents today for hospital follow up.   Hospitalized at [facility] from 01/23 to 01/28, for acalculous cholecystitis with plastic biliary stent placement.  Since discharge, patient reports mild intermittent epigastric abdominal pain, no worse when eating. Denies dysuria, hematuria, n/v/d, constipation, or any other complaints at this time. Has been eating and drinking well but unable to control BG well without her CGM and since being off her home regimen while hospitalized.   Does report persistent weakness since hospitalization. Described as being wobbly and unable to keep herself steady and balanced at home without help or her walker. Dropping food, utensils, and glasses suddenly from her hands. Yesterday, she lost balance while turning around in the kitchen and slammed her head extremely hard into the kitchen sink. She reports L sided periorbital pain and tenderness, no globe pain or hemorrhage. Daughter was at home and helped patient, put ice on affected area.   OBJECTIVE:   BP (!) 145/69   Pulse 77   Wt 115 lb 12.8 oz (52.5 kg)   SpO2 95%   BMI 19.88 kg/m   General: A&O, NAD HEENT: No sign of trauma, EOM grossly intact, moderate tenderness to palpation of L periorbital area without obvious ecchymoses Cardiac: RRR, no m/r/g Respiratory: CTAB, normal WOB, no w/c/r GI: Soft, NTTP, non-distended, normal BS Extremities: NTTP, no peripheral edema. Neuro: moves all four extremities appropriately, CN II-XII intact, sensation intact bilaterally, 5/5 strength bilaterally, walks normally with cane however I did see her sway towards the wall walking into the exam room Psych: Appropriate mood and affect, in good spirits  ASSESSMENT/PLAN:   Assessment & Plan Hospital discharge follow-up Doing much better besides persistent weakness since discharge.  - Follow up with GI next week at your scheduled appointment Fall,  initial encounter Generalized weakness Generalized weakness since hospital discharge causing patient to fall, needing to use walker and extra help at home than normal.  - Concern with how hard patient fell and hit head on sink - Discussed going straight to ED from this appointment for weakness and fall workup. Patient is agreeable. I spoke with her son who was her ride who was agreeable as well.  - FMTS team notified of patient arriving to ED    Camie Dixons, DO [facility]  "

## 2024-08-01 NOTE — ED Triage Notes (Signed)
 Pt recently discharged from hospital and had a mechanical fall yesterday, hitting the right side of her face. Denies LOC. Pt's PMD suggested she come here for testing.

## 2024-08-02 ENCOUNTER — Ambulatory Visit: Payer: Self-pay

## 2024-08-02 ENCOUNTER — Emergency Department (HOSPITAL_COMMUNITY): Payer: Medicare (Managed Care)

## 2024-08-02 LAB — URINALYSIS, ROUTINE W REFLEX MICROSCOPIC
Bilirubin Urine: NEGATIVE
Glucose, UA: >=500 mg/dL — AB
Hgb urine dipstick: NEGATIVE
Ketones, ur: NEGATIVE mg/dL
Nitrite: NEGATIVE
Protein, ur: NEGATIVE mg/dL
Specific Gravity, Urine: 1.014 (ref 1.005–1.030)
pH: 5 (ref 5.0–8.0)

## 2024-08-02 LAB — HEPATIC FUNCTION PANEL
ALT: 272 U/L — ABNORMAL HIGH (ref 0–44)
AST: 77 U/L — ABNORMAL HIGH (ref 15–41)
Albumin: 4.2 g/dL (ref 3.5–5.0)
Alkaline Phosphatase: 222 U/L — ABNORMAL HIGH (ref 38–126)
Bilirubin, Direct: 1.1 mg/dL — ABNORMAL HIGH (ref 0.0–0.2)
Indirect Bilirubin: 0.6 mg/dL (ref 0.3–0.9)
Total Bilirubin: 1.7 mg/dL — ABNORMAL HIGH (ref 0.0–1.2)
Total Protein: 7.4 g/dL (ref 6.5–8.1)

## 2024-08-02 MED ORDER — CEPHALEXIN 250 MG PO CAPS
500.0000 mg | ORAL_CAPSULE | Freq: Two times a day (BID) | ORAL | Status: AC
  Administered 2024-08-02: 500 mg via ORAL
  Filled 2024-08-02: qty 2

## 2024-08-02 MED ORDER — CEPHALEXIN 500 MG PO CAPS: 500.0000 mg | ORAL_CAPSULE | Freq: Two times a day (BID) | ORAL | 0 refills | Status: AC

## 2024-08-02 NOTE — ED Provider Notes (Signed)
 " Alicia [name]   CSN: [id number] Arrival date & time: 08/01/24  8076     Patient presents with: [place]   Alicia [name] is a 69 y.o. female.   69 y/o female with hx of DM, HTN, HLD, and deafness presents to the emergency department for evaluation of generalized weakness.  She has been feeling increasingly weak since discharge from the hospital a few days ago.  She has history of recent hospitalization for acalculous cholecystitis.  Usually walks with a cane or walker at baseline.  Reports episodes of unsteadiness when on her feet.  Sometimes feels lightheaded, but has not had any near syncope/syncope.  Weakness resulted in a fall at home.  Patient struck her face on the kitchen counter.  She had no LOC at this time.  Does continue to complain of left-sided facial pain.  Daughter reports that patient was largely immobile during her hospitalization and also necessitated general anesthesia on 2 separate occasions for ERCP.  No dysuria, fevers, vomiting, chest pain, unilateral numbness or paresthesias, headache.  The history is provided by the patient. A language interpreter was used.  Fall       Prior to Admission medications  Medication Sig Start Date End Date Taking? Authorizing Provider  cephALEXin  (KEFLEX ) 500 MG capsule Take 1 capsule (500 mg total) by mouth 2 (two) times daily. 08/02/24  Yes Keith Sor, PA-C  acetaminophen  (TYLENOL ) 500 MG tablet Take 1,000 mg by mouth every 6 (six) hours as needed for mild pain (pain score 1-3).    [provider]  carvedilol  (COREG ) 25 MG tablet TAKE 1 TABLET BY MOUTH TWICE A DAY. 04/03/24   Parthenia Olivia HERO, PA-C  cholecalciferol  (VITAMIN D ) 1000 UNITS tablet Take 1,000 Units by mouth daily.    [provider]  Cyanocobalamin  (VITAMIN B 12) 500 MCG TABS Take 500 mcg by mouth daily at 6 (six) AM.    [provider]  diltiazem  (CARDIZEM  CD) 240 MG 24 hr capsule Take 1  capsule (240 mg total) by mouth daily. 03/05/24   Majeed, Camie, DO  diltiazem  (CARDIZEM ) 30 MG tablet Take 30 mg by mouth as needed (Palpitations). TAKE 1 TABLET BY MOUTH AS NEEDED (HEART RACING OR PALPITATIONS THAT LAST LONGER THAN 15 MINS    [provider]  DULoxetine  (CYMBALTA ) 60 MG capsule Take 1 capsule (60 mg total) by mouth daily. 05/20/24   Baloch, Mahnoor, MD  famotidine  (PEPCID ) 20 MG tablet Take 1 tablet (20 mg total) by mouth 2 (two) times daily. 03/04/24   Majeed, Camie, DO  fluticasone  (FLONASE ) 50 MCG/ACT nasal spray 2 puffs each nostril daily Patient taking differently: Place 2 sprays into both nostrils daily as needed for allergies or rhinitis. 2 puffs each nostril daily 03/11/24   McDiarmid, Krystal BIRCH, MD  gabapentin  (NEURONTIN ) 800 MG tablet TAKE 1 TABLET BY MOUTH TWICE A DAY 04/07/24   Gershon Donnice JONELLE, DPM  hydrochlorothiazide  (MICROZIDE ) 12.5 MG capsule Take 1 capsule (12.5 mg total) by mouth daily. 06/25/24   Majeed, Camie, DO  insulin  glargine (LANTUS  SOLOSTAR) 100 UNIT/ML Solostar Pen Inject 4 Units into the skin 2 (two) times daily. Patient taking differently: Inject 5 Units into the skin 2 (two) times daily. 03/11/24   McDiarmid, Krystal BIRCH, MD  insulin  lispro (HUMALOG ) 100 UNIT/ML injection INJECT 2-4 UNITS INTO THE SKIN 3 (THREE) TIMES DAILY WITH MEALS 07/30/24   Romelle Booty, MD  Lidocaine -Menthol  (NERVIVE ROLL-ON) 4-1 % LIQD Apply  1 Application topically as needed (foot (nerve) pain).    [provider]  metFORMIN  (GLUCOPHAGE ) 1000 MG tablet Take 1 tablet (1,000 mg total) by mouth 2 (two) times daily. 02/19/24   Majeed, Camie, DO  Multiple Vitamins-Minerals (MULTIVITAMIN GUMMIES WOMENS) CHEW Chew 2 each by mouth daily.    [provider]  olopatadine  (PATADAY ) 0.1 % ophthalmic solution Place 2 drops into both eyes every morning.    [provider]  pantoprazole  (PROTONIX ) 40 MG tablet Take 1 tablet (40 mg total) by mouth daily. 04/10/24   Larraine Palma, MD  Pre-Moistened Witch Hazel 50 % PADS Apply 1 each topically as needed (hemorrhoids).    [provider]  rosuvastatin  (CRESTOR ) 5 MG tablet TAKE 1 TABLET (5 MG TOTAL) BY MOUTH 2 TIMES A WEEK Patient not taking: Reported on 07/26/2024 07/09/24   Camnitz, Soyla Lunger, MD  [Paused] Semaglutide , 2 MG/DOSE, (OZEMPIC , 2 MG/DOSE,) 8 MG/3ML SOPN Inject 2 mg into the skin once a week. Patient taking differently: Inject 2 mg into the skin every Saturday. Wait to take this until your doctor or other care provider tells you to start again. 02/28/23   Jennelle Riis, MD  valsartan  (DIOVAN ) 160 MG tablet Take 1 tablet (160 mg total) by mouth daily. Patient taking differently: Take 160 mg by mouth at bedtime. 06/02/24   Lupie Camie, DO    Allergies: Sulfonamide derivatives, Lipitor [atorvastatin calcium ], Ramipril, and Trulicity  [dulaglutide ]    Review of Systems Ten systems reviewed and are negative for acute change, except as noted in the HPI.    Updated Vital Signs BP 128/60 (BP Location: Right Arm)   Pulse 78   Temp 98 F (36.7 C) (Oral)   Resp 17   SpO2 100%   Physical Exam Vitals and nursing [name] reviewed.  Constitutional:      General: She is not in acute distress.    Appearance: She is well-developed. She is not diaphoretic.     Comments: Alert, pleasant and nontoxic appearing.  HENT:     Head: Normocephalic and atraumatic.     Comments: Some tenderness to the left infraorbital region and zygoma without obvious swelling. No hematoma, ecchymosis, or crepitus. Eyes:     General: No scleral icterus.    Conjunctiva/sclera: Conjunctivae normal.  Pulmonary:     Effort: Pulmonary effort is normal. No respiratory distress.     Comments: Respirations even and unlabored Musculoskeletal:        General: Normal range of motion.     Cervical back: Normal range of motion.  Skin:    General: Skin is warm and dry.     Coloration: Skin is not pale.     Findings: No erythema or  rash.  Neurological:     Mental Status: She is alert and oriented to person, place, and time.     Cranial Nerves: No cranial nerve deficit.     Coordination: Coordination normal.     Comments: GCS 15.  No focal deficits noted.  Moving extremities symmetrically.  Psychiatric:        Behavior: Behavior normal.     (all labs ordered are listed, but only abnormal results are displayed) Labs Reviewed  CBC WITH DIFFERENTIAL/PLATELET - Abnormal; Notable for the following components:      Result Value   RBC 3.82 (*)    Hemoglobin 11.4 (*)    HCT 33.7 (*)    RDW 16.3 (*)    All other components within normal limits  BASIC  METABOLIC PANEL WITH GFR - Abnormal; Notable for the following components:   Potassium 3.2 (*)    Chloride 95 (*)    Glucose, Bld 293 (*)    Calcium  10.6 (*)    All other components within normal limits  HEPATIC FUNCTION PANEL - Abnormal; Notable for the following components:   AST 77 (*)    ALT 272 (*)    Alkaline Phosphatase 222 (*)    Total Bilirubin 1.7 (*)    Bilirubin, Direct 1.1 (*)    All other components within normal limits  URINALYSIS, ROUTINE W REFLEX MICROSCOPIC - Abnormal; Notable for the following components:   APPearance HAZY (*)    Glucose, UA >=500 (*)    Leukocytes,Ua SMALL (*)    Bacteria, UA FEW (*)    All other components within normal limits  URINE CULTURE    EKG: None  Radiology: CT HEAD WO CONTRAST ( ) Result Date: 08/02/2024 EXAM: CT HEAD WITHOUT CONTRAST 08/02/2024 04:50:00 AM TECHNIQUE: CT of the head was performed without the administration of intravenous contrast. Automated exposure control, iterative reconstruction, and/or weight based adjustment of the mA/kV was utilized to reduce the radiation dose to as low as reasonably achievable. COMPARISON: Prior MRI from 12/20/2019 CLINICAL HISTORY: Head trauma, minor (age >= 65 years). FINDINGS: BRAIN AND VENTRICLES: No acute hemorrhage. No evidence of acute infarct. No hydrocephalus.  No extra-axial collection. No mass effect or midline shift. ORBITS: No acute abnormality. SINUSES: No acute abnormality. SOFT TISSUES AND SKULL: No acute soft tissue abnormality. No skull fracture. IMPRESSION: 1. No acute intracranial abnormality. Electronically signed by: Morene Hoard MD 08/02/2024 06:04 AM EST RP Workstation: [id number]   CT Maxillofacial Wo Contrast Result Date: 08/02/2024 EXAM: CT OF THE FACE WITHOUT CONTRAST 08/02/2024 04:56:00 AM TECHNIQUE: CT of the face was performed without the administration of intravenous contrast. Multiplanar reformatted images are provided for review. Automated exposure control, iterative reconstruction, and/or weight based adjustment of the mA/kV was utilized to reduce the radiation dose to as low as reasonably achievable. COMPARISON: None available. CLINICAL HISTORY: Facial trauma, blunt. FINDINGS: FACIAL BONES: No acute facial fracture. No mandibular dislocation. No suspicious bone lesion. ORBITS: Globes are intact. No acute traumatic injury. No inflammatory change. The patient is status post bilateral lens replacement. SINUSES AND MASTOIDS: No acute abnormality. SOFT TISSUES: No acute abnormality. IMPRESSION: 1. No acute facial fracture. 2. Status post bilateral lens replacement. Electronically signed by: Evalene Coho MD 08/02/2024 05:34 AM EST RP Workstation: [id number]     Procedures   Medications Ordered in the ED  cephALEXin  (KEFLEX ) capsule 500 mg (has no administration in time range)    Clinical Course as of 08/02/24 0617  Sat Aug 02, 2024  0413 Laboratory workup initiated in triage.  Anemia appears stable compared to prior.  There is mild hypokalemia.  Glucose elevated at 293, but stable compared to prior.  Her LFTs continue to downtrend since hospital discharge.  The patient has no obvious focal deficits on exam.  I largely suspect symptoms to be related to physical deconditioning from patient's bedbound state during  hospitalization in conjunction with her need for general anesthesia x 2.  Urinalysis ordered to ensure no UTI.  CT also ordered to exclude intracranial pathology.  Will extend through max/face to assess for fracture related to fall.  If pending workup is unremarkable, patient would likely benefit from outpatient PT intervention. [KH]  0541 UA sent to lab. [KH]  0541 CT maxillofacial negative for fx. [KH]  [phone] UA is  notable for pyuria.  In the setting of urinary frequency, urgency, and generalized weakness, will treat for urinary tract infection with Keflex .  Urine culture ordered.  I have also placed a consult to family practice to close the loop with their service and to try and expedite referral for outpatient PT. [KH]    Clinical Course User Index [KH] Keith Sor, PA-C                                 Medical Decision Making Amount and/or Complexity of Data Reviewed Labs: ordered. Radiology: ordered.  Risk Prescription drug management.   This patient presents to the ED for concern of generalized weakness, this involves an extensive number of treatment options, and is a complaint that carries with it a high risk of complications and morbidity.  The differential diagnosis includes dehydration vs UTI vs CVA vs hypoglycemia vs DKA vs symptomatic anemia   Co morbidities that complicate the patient evaluation  DM HTN HLD   Additional history obtained:  Additional history obtained from daugther, at bedside External records from outside source obtained and reviewed including ERCP 07/27/24 and 07/28/24.   Lab Tests:  I Ordered, and personally interpreted labs.  The pertinent results include:  Hgb 11.4. Glucose 293. K 3.2. AST 77, ALT 272, Alk phos 222, Tbili 1.7.    Imaging Studies ordered:  I ordered imaging studies including CT head and maxillofacial  I independently visualized and interpreted imaging which showed no acute pathology I agree with the radiologist  interpretation   Cardiac Monitoring:  The patient was maintained on a cardiac monitor.  I personally viewed and interpreted the cardiac monitored which showed an underlying rhythm of: NSR   Medicines ordered and prescription drug management:  I ordered medication including Keflex  for UTI coverage  Reevaluation of the patient after these medicines showed that the patient stayed the same I have reviewed the patients home medicines and have made adjustments as needed   Problem List / ED Course:  As above   Reevaluation:  After the interventions noted above, I reevaluated the patient and found that they have :stayed the same   Social Determinants of Health:  Deaf    Dispostion:  After consideration of the diagnostic results and the patients response to treatment, I feel that the patent would benefit from course of Keflex  for treatment of presumed UTI. Also encouraged outpatient PT for strength exercises. Stable for continued f/u in family practice clinic. Return precautions discussed and provided. Patient discharged in stable condition with no unaddressed concerns.       Final diagnoses:  Generalized weakness  Pyuria    ED Discharge Orders          Ordered    cephALEXin  (KEFLEX ) 500 MG capsule  2 times daily        08/02/24 0615               Keith Sor, PA-C 08/02/24 9376    Theadore Ozell HERO, MD 08/02/24 [phone]  "

## 2024-08-02 NOTE — ED Notes (Signed)
 Micro lab called to add on urine culture

## 2024-08-02 NOTE — Discharge Instructions (Addendum)
 Take Keflex  as prescribed until finished.  Your next dose is due on the evening of 08/02/2024.  Continue follow-up with family practice.  You may return to the ED for new or concerning symptoms.

## 2024-08-04 ENCOUNTER — Telehealth: Payer: Self-pay

## 2024-08-04 ENCOUNTER — Ambulatory Visit: Payer: Self-pay

## 2024-08-04 LAB — URINE CULTURE: Culture: >=100000 — AB

## 2024-08-04 NOTE — Transitions of Care (Post Inpatient/ED Visit) (Signed)
" ° °  08/04/2024  Name: MARGOT ORIORDAN MRN: [id number] DOB: 07-Feb-1956  Today's TOC FU Call Status: Today's TOC FU Call Status:: Unsuccessful Call (2nd Attempt) Unsuccessful Call (2nd Attempt) Date: 08/04/24  Attempted to reach the patient regarding the most recent Inpatient/ED visit.  Follow Up Plan: Additional outreach attempts will be made to reach the patient to complete the Transitions of Care (Post Inpatient/ED visit) call.    Bing Edison MSN, RN RN [employer]  [facility] Office Hours M-F [phone] Direct Dial : [phone] Main Phone [phone]  Fax: [phone] [email or web address]    "

## 2024-08-04 NOTE — Telephone Encounter (Signed)
 Called pt's number on file and confirmed name and DOB.  Discussed lab results from biliary stricture sample that they are concerning for carcinoma. She reports that she has f/u scheduled with [facility] this Wednesday. Explained that GI will discuss results further regarding prognosis and treatment. Answered any questions she had an urged her to schedule follow up with the clinic if she needs any support or help with symptoms or the diagnosis moving forward.   Dr. Fairy Amy, MD [facility] Family Medicine Resident, PGY-1

## 2024-08-05 ENCOUNTER — Telehealth (HOSPITAL_BASED_OUTPATIENT_CLINIC_OR_DEPARTMENT_OTHER): Payer: Self-pay | Admitting: *Deleted

## 2024-08-05 ENCOUNTER — Telehealth: Payer: Self-pay

## 2024-08-06 ENCOUNTER — Telehealth: Payer: Self-pay

## 2024-08-06 ENCOUNTER — Ambulatory Visit: Payer: Self-pay | Admitting: Family Medicine

## 2024-08-06 ENCOUNTER — Encounter: Payer: Self-pay | Admitting: Family Medicine

## 2024-08-06 VITALS — BP 111/61 | HR 83 | Ht 64.0 in | Wt 125.0 lb

## 2024-08-06 DIAGNOSIS — E1142 Type 2 diabetes mellitus with diabetic polyneuropathy: Secondary | ICD-10-CM

## 2024-08-06 DIAGNOSIS — R5381 Other malaise: Secondary | ICD-10-CM

## 2024-08-06 NOTE — Patient Instructions (Signed)
 Good to see you today - Thank you for coming in  Things we discussed today:  1) For your diabetes - Increase your Lantus  insulin  from 5 units to 6 units twice a day.  - After 1-2 days on this dose, if your morning fasting sugar is less than 200, then stay at 6 units twice a day - If after 1-2 days the morning fasting sugar is greater than 200, then increase again to 7 units. Do not go higher than 7 units twice a day.  - If your morning sugars are below 100, then go back to your prior Lantus  dose of 5 units twice a day  2) Come back to see Dr. Koval in 1 week  3) For your weakness and balance issues, I will send a referral for you to see a physical therapist. Expect a phone call in the next few weeks to schedule that.

## 2024-08-06 NOTE — Transitions of Care (Post Inpatient/ED Visit) (Signed)
" ° °  08/06/2024  Name: Alicia [name] MRN: [id number] DOB: [birth year]  Today's TOC FU Call Status: Today's TOC FU Call Status:: Unsuccessful Call (3rd Attempt) Unsuccessful Call (3rd Attempt) Date: 08/06/24  Attempted to reach the patient regarding the most recent Inpatient/ED visit.  Follow Up Plan: No further outreach attempts will be made at this time. We have been unable to contact the patient.   Bing Edison MSN, RN RN [employer]  [facility] Office Hours M-F [phone] Direct Dial : [phone] Main Phone [phone]  Fax: [phone] [email or web address]    "

## 2024-08-06 NOTE — Assessment & Plan Note (Signed)
 Recently uncontrolled since her hospitalization, suspect 2/2 acute illness. Will increase basal Lantus  as below - f/u in 1 wk w/ Dr. Koval

## 2024-08-06 NOTE — Progress Notes (Unsigned)
" ° ° °  SUBJECTIVE:   CHIEF COMPLAINT / HPI:   ***   Recently seen in ED 1/30 for mechanical fall, workup was only notable for UTI which she got keflex  for. CT head was neg. Also had recent hospitalization for cholecystitis s/p stent placement. I spoke with EDP at that time and discussed that pt may need PT for deconditioning and fall risk after her hospitalization.   Discussed the use of AI scribe software for clinical note transcription with the patient, who gave verbal consent to proceed.  History of Present Illness Alicia [name] is a 69 year old female with diabetes who presents with persistently high blood sugar levels.  Hyperglycemia - Persistently elevated blood glucose levels since recent hospitalization, with readings not dropping below 300 mg/dL and reaching as high as 500 mg/dL - Morning fasting glucose previously 80-150 mg/dL, now consistently in the 300s - Currently taking 5 units of Lantus  twice daily without improvement in glycemic control - No changes in diet or insulin  regimen  Neurological symptoms - Prickly sensation in right leg when blood glucose was in the 400s, resolved after increased water intake - Balance issues present, sometimes uses a walking stick for ambulation - No walker used  Genitourinary symptoms - Frequent urination - Recent urinary tract infection  Recent trauma - Recent fall resulting in emergency department visit  Hepatic findings - Received call two days ago regarding possible liver malignancy identified on CT scan during hospitalization - Scheduled appointment with gastroenterology today for further evaluation  Functional status - Resides in a single-level home after previously living in homes with stairs    PERTINENT  PMH / PSH: ***  OBJECTIVE:   There were no vitals taken for this visit.  ***  ASSESSMENT/PLAN:   Assessment & Plan Physical deconditioning At increased fall risk, balance issues. Will send referral to PT. Will  also provide handicap placard form.    Type 2 diabetes mellitus with diabetic polyneuropathy, with long-term current use of insulin  (HCC) Recently uncontrolled since her hospitalization, suspect 2/2 acute illness. Will increase basal Lantus  as below   Patient Instructions  Good to see you today - Thank you for coming in  Things we discussed today:  1) For your diabetes - Increase your Lantus  insulin  from 5 units to 6 units twice a day.  - After 1-2 days on this dose, if your morning fasting sugar is less than 200, then stay at 6 units twice a day - If after 1-2 days the morning fasting sugar is greater than 200, then increase again to 7 units. Do not go higher than 7 units twice a day.  - If your morning sugars are below 100, then go back to your prior Lantus  dose of 5 units twice a day  2) Come back to see Dr. Koval in 1 week  3) For your weakness and balance issues, I will send a referral for you to see a physical therapist. Expect a phone call in the next few weeks to schedule that.    Twyla Nearing, MD [facility] "

## 2024-08-14 ENCOUNTER — Ambulatory Visit: Payer: Medicare (Managed Care) | Admitting: Pharmacist

## 2024-08-18 ENCOUNTER — Ambulatory Visit: Payer: Medicare (Managed Care)

## 2024-09-18 ENCOUNTER — Ambulatory Visit: Admitting: Podiatry
